# Patient Record
Sex: Male | Born: 1960
Health system: Southern US, Community
[De-identification: ages and names within clinical notes are randomized; demographics above are authoritative.]

## PROBLEM LIST (undated history)

## (undated) ENCOUNTER — Ambulatory Visit: Admission: EM | Source: Home / Self Care

## (undated) DIAGNOSIS — I10 Essential (primary) hypertension: Secondary | ICD-10-CM

## (undated) DIAGNOSIS — I350 Nonrheumatic aortic (valve) stenosis: Secondary | ICD-10-CM

## (undated) DIAGNOSIS — G629 Polyneuropathy, unspecified: Secondary | ICD-10-CM

## (undated) DIAGNOSIS — T7840XA Allergy, unspecified, initial encounter: Secondary | ICD-10-CM

## (undated) DIAGNOSIS — I739 Peripheral vascular disease, unspecified: Secondary | ICD-10-CM

## (undated) DIAGNOSIS — J189 Pneumonia, unspecified organism: Secondary | ICD-10-CM

## (undated) DIAGNOSIS — R011 Cardiac murmur, unspecified: Secondary | ICD-10-CM

## (undated) DIAGNOSIS — J302 Other seasonal allergic rhinitis: Secondary | ICD-10-CM

## (undated) DIAGNOSIS — M1990 Unspecified osteoarthritis, unspecified site: Secondary | ICD-10-CM

## (undated) DIAGNOSIS — M5430 Sciatica, unspecified side: Secondary | ICD-10-CM

## (undated) DIAGNOSIS — R000 Tachycardia, unspecified: Secondary | ICD-10-CM

## (undated) DIAGNOSIS — E7800 Pure hypercholesterolemia, unspecified: Secondary | ICD-10-CM

## (undated) DIAGNOSIS — I2510 Atherosclerotic heart disease of native coronary artery without angina pectoris: Secondary | ICD-10-CM

## (undated) DIAGNOSIS — M4800 Spinal stenosis, site unspecified: Secondary | ICD-10-CM

## (undated) DIAGNOSIS — K219 Gastro-esophageal reflux disease without esophagitis: Secondary | ICD-10-CM

## (undated) DIAGNOSIS — R0989 Other specified symptoms and signs involving the circulatory and respiratory systems: Secondary | ICD-10-CM

## (undated) HISTORY — DX: Polyneuropathy, unspecified: G62.9

## (undated) HISTORY — DX: Allergy, unspecified, initial encounter: T78.40XA

## (undated) HISTORY — DX: Other specified symptoms and signs involving the circulatory and respiratory systems: R09.89

## (undated) HISTORY — DX: Atherosclerotic heart disease of native coronary artery without angina pectoris: I25.10

## (undated) HISTORY — DX: Nonrheumatic aortic (valve) stenosis: I35.0

## (undated) HISTORY — PX: KNEE ARTHROSCOPY: SHX127

## (undated) HISTORY — PX: CARDIAC CATHETERIZATION: SHX172

## (undated) HISTORY — DX: Pure hypercholesterolemia, unspecified: E78.00

---

## 1998-06-11 ENCOUNTER — Emergency Department (HOSPITAL_COMMUNITY): Admission: EM | Admit: 1998-06-11 | Discharge: 1998-06-11 | Payer: Self-pay | Admitting: Emergency Medicine

## 1998-06-11 ENCOUNTER — Encounter: Payer: Self-pay | Admitting: Emergency Medicine

## 1998-06-18 ENCOUNTER — Encounter: Admission: RE | Admit: 1998-06-18 | Discharge: 1998-06-18 | Payer: Self-pay | Admitting: Internal Medicine

## 2003-04-18 ENCOUNTER — Observation Stay (HOSPITAL_COMMUNITY): Admission: EM | Admit: 2003-04-18 | Discharge: 2003-04-19 | Payer: Self-pay | Admitting: *Deleted

## 2004-03-25 ENCOUNTER — Ambulatory Visit: Payer: Self-pay | Admitting: Internal Medicine

## 2004-10-08 ENCOUNTER — Ambulatory Visit: Payer: Self-pay | Admitting: Internal Medicine

## 2005-12-26 ENCOUNTER — Ambulatory Visit: Payer: Self-pay | Admitting: Internal Medicine

## 2006-04-07 ENCOUNTER — Ambulatory Visit: Payer: Self-pay | Admitting: Internal Medicine

## 2006-07-13 ENCOUNTER — Emergency Department (HOSPITAL_COMMUNITY): Admission: EM | Admit: 2006-07-13 | Discharge: 2006-07-13 | Payer: Self-pay | Admitting: Emergency Medicine

## 2006-07-13 IMAGING — CR DG CHEST 2V
2 series · 2 of 2 positions shown · non-contrast
Comparison: none

CLINICAL DATA: 45 year-old with chest pain.
 CHEST - 2 VIEW:

[w chest pa]
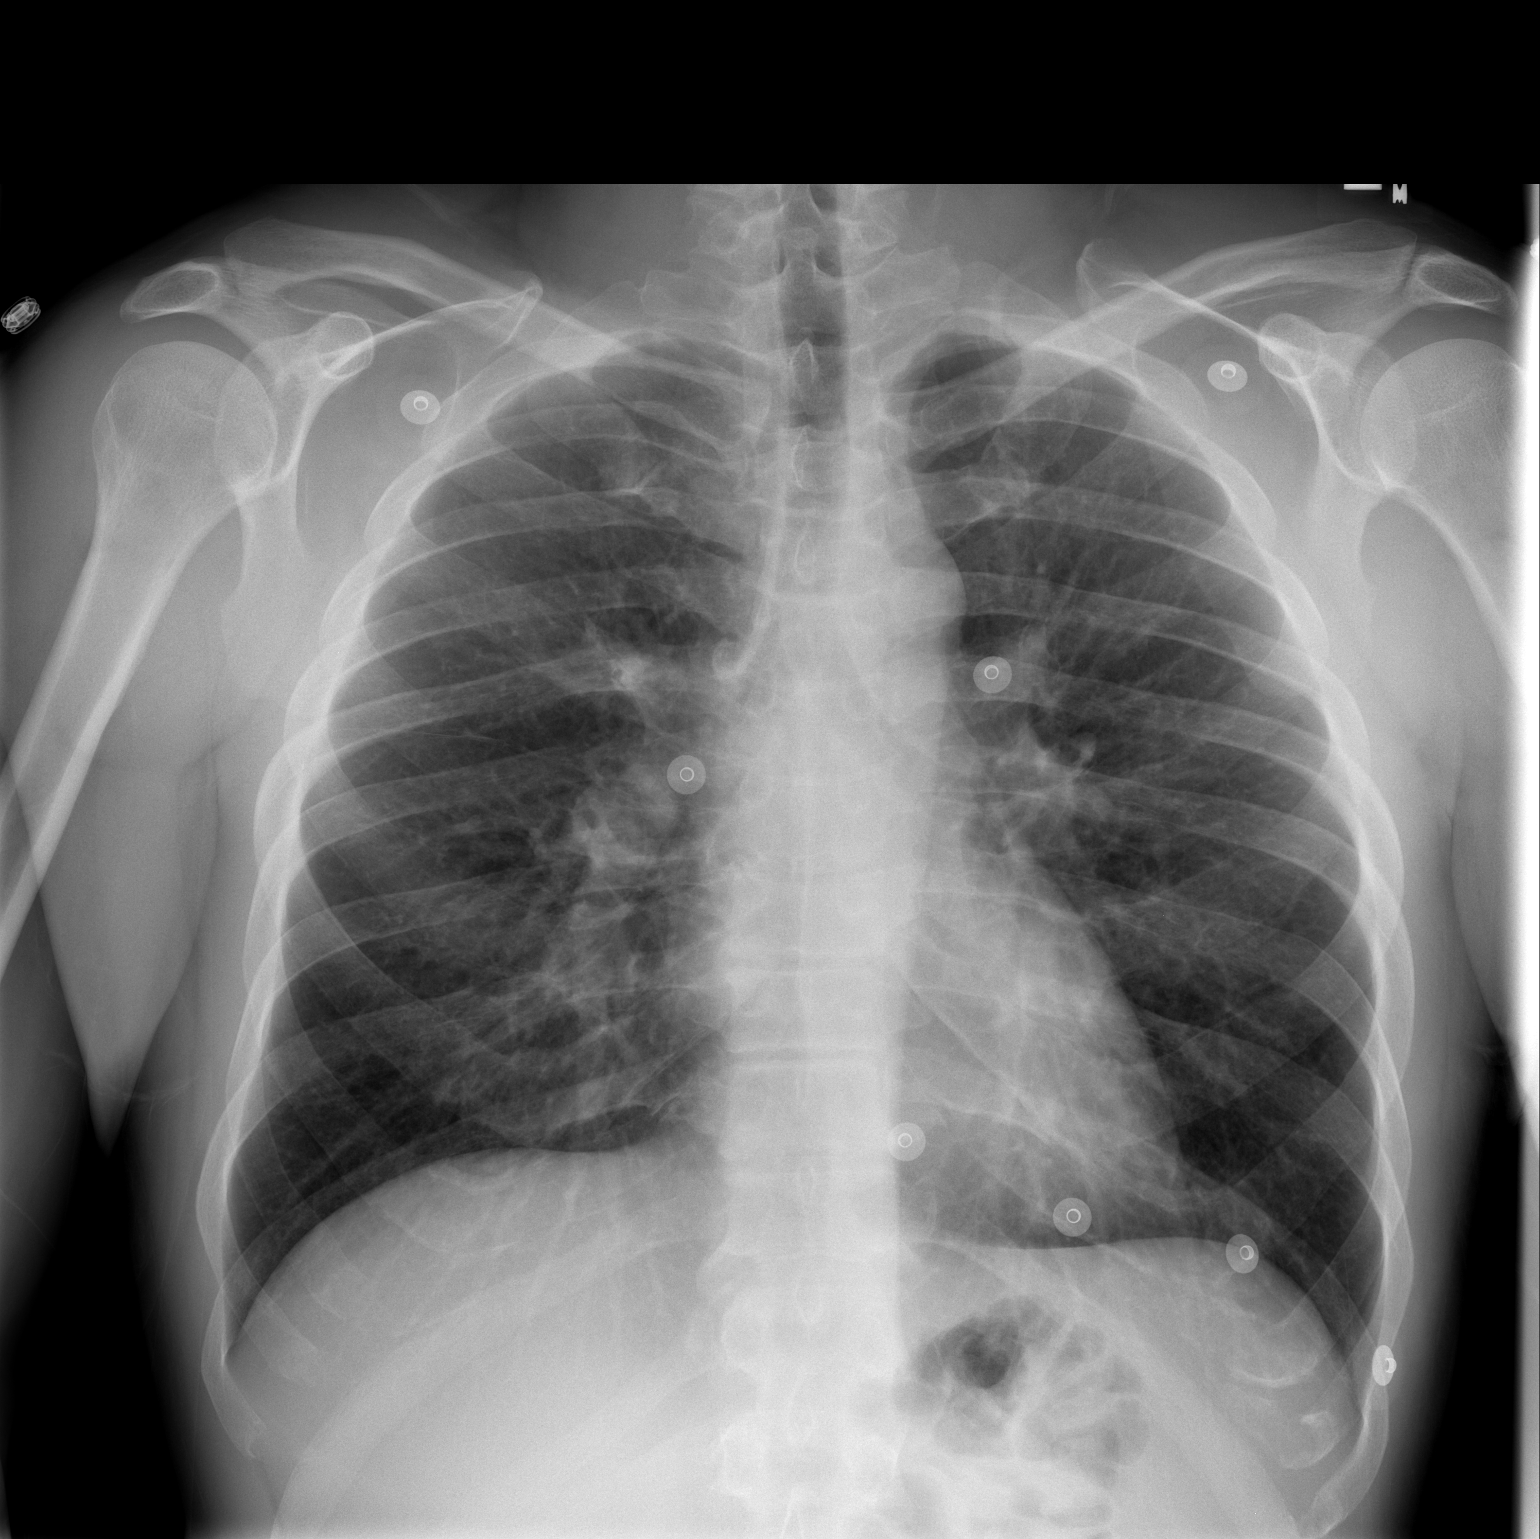

[w chest lat]
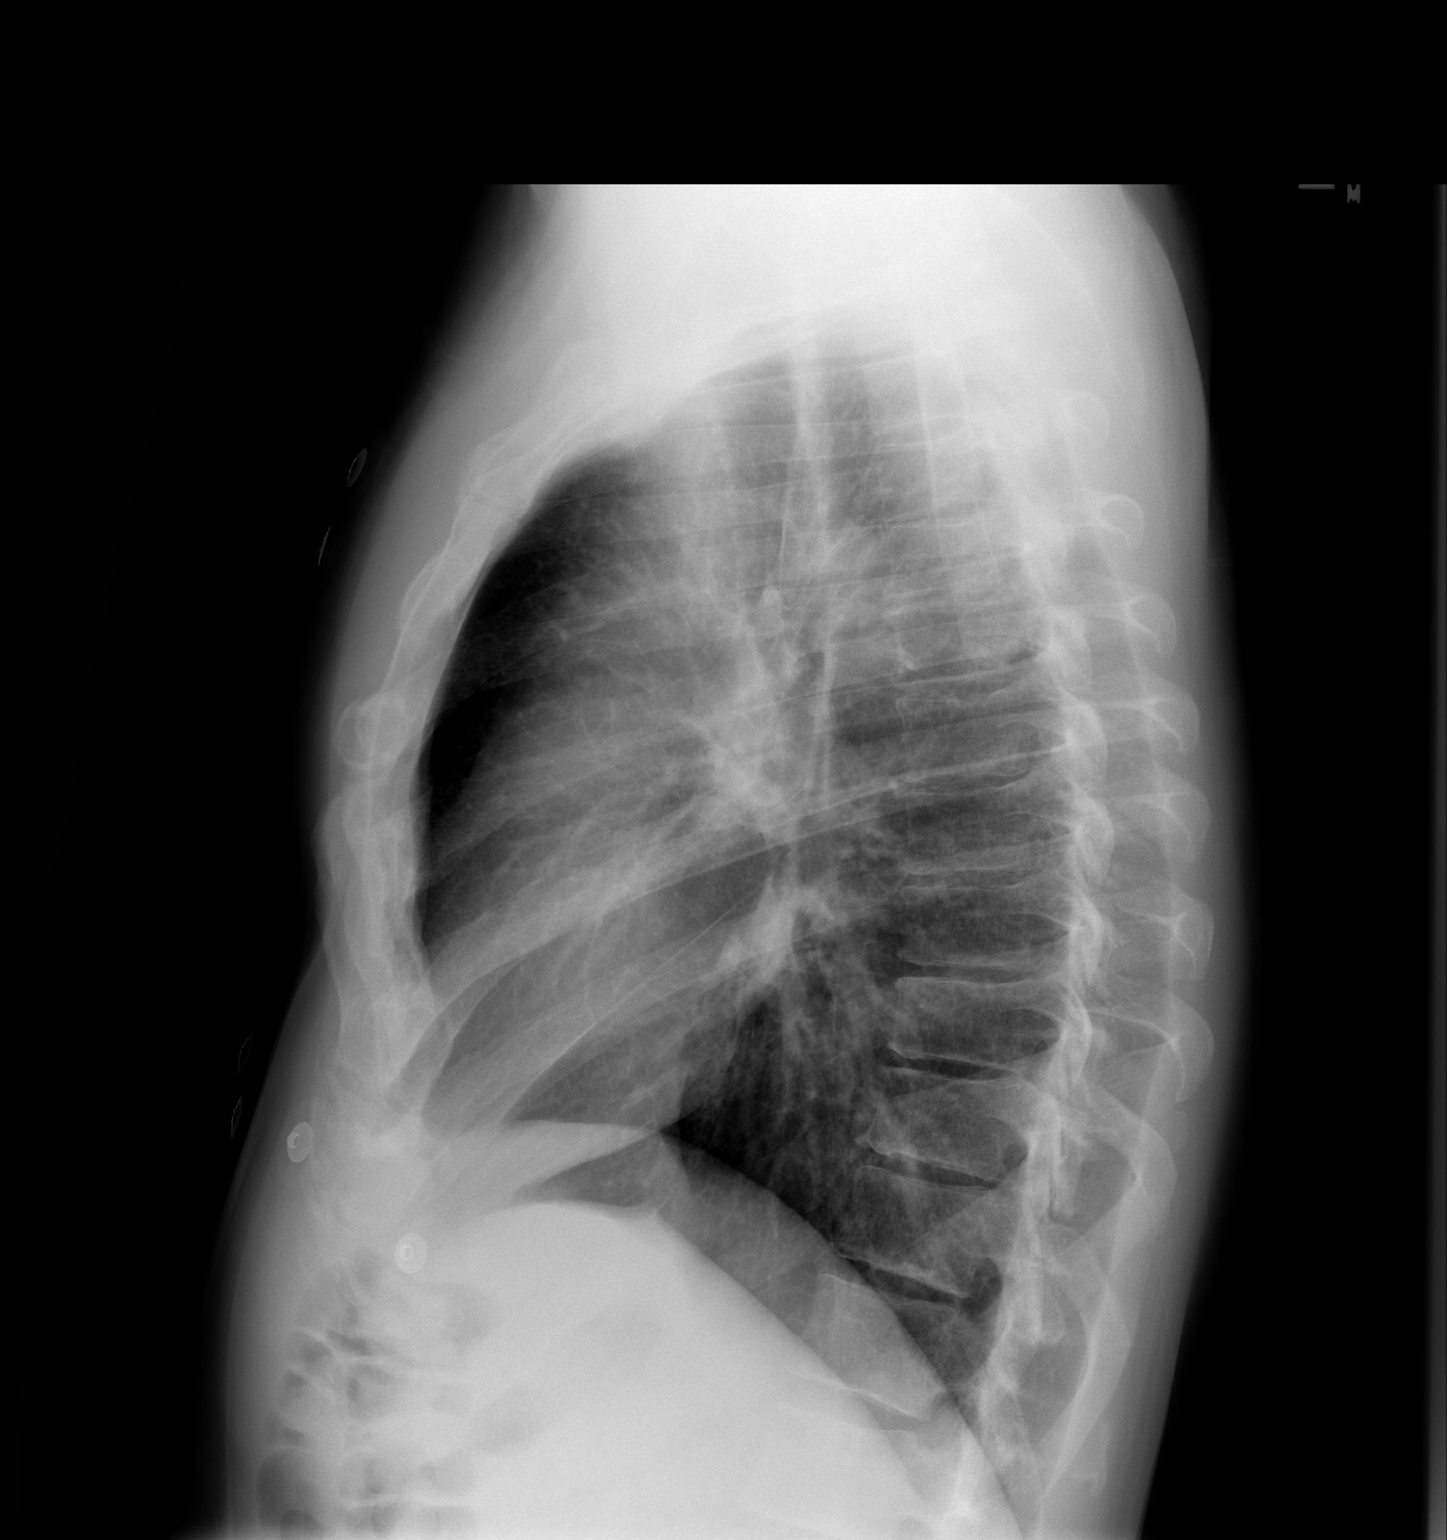

[2 of 2 positions shown; findings below may reference images not displayed]

FINDINGS: Two views of the chest demonstrate mild interstitial thickening.  Findings could represent chronic bronchitic changes vs mild edema.  There is no focal airspace disease. Heart and mediastinum are within normal limits.  The trachea is midline.  Bone structures are intact.  Negative for pleural effusions.
IMPRESSION: Interstitial thickening possibly related to chronic bronchitic changes. An atypical infection cannot be excluded.

## 2006-07-15 ENCOUNTER — Ambulatory Visit: Payer: Self-pay | Admitting: Internal Medicine

## 2007-01-15 ENCOUNTER — Encounter: Payer: Self-pay | Admitting: *Deleted

## 2007-01-18 DIAGNOSIS — E785 Hyperlipidemia, unspecified: Secondary | ICD-10-CM

## 2007-01-18 DIAGNOSIS — E782 Mixed hyperlipidemia: Secondary | ICD-10-CM | POA: Insufficient documentation

## 2007-01-18 DIAGNOSIS — I10 Essential (primary) hypertension: Secondary | ICD-10-CM | POA: Insufficient documentation

## 2011-05-20 HISTORY — PX: COLONOSCOPY: SHX174

## 2012-02-20 ENCOUNTER — Ambulatory Visit (INDEPENDENT_AMBULATORY_CARE_PROVIDER_SITE_OTHER): Payer: BC Managed Care – PPO | Admitting: Family Medicine

## 2012-02-20 DIAGNOSIS — Z23 Encounter for immunization: Secondary | ICD-10-CM

## 2012-09-13 ENCOUNTER — Other Ambulatory Visit: Payer: Self-pay | Admitting: Family Medicine

## 2012-09-13 ENCOUNTER — Ambulatory Visit
Admission: RE | Admit: 2012-09-13 | Discharge: 2012-09-13 | Disposition: A | Discharge status: Home / Self Care | Payer: BC Managed Care – PPO | Source: Ambulatory Visit | Attending: Family Medicine | Admitting: Family Medicine

## 2012-09-13 DIAGNOSIS — R071 Chest pain on breathing: Secondary | ICD-10-CM

## 2012-09-13 IMAGING — CR DG STERNUM 2+V
3 series · 3 of 3 positions shown · non-contrast
Comparison: prior chest x-ray [DATE]}

CLINICAL DATA: Chest wall pain, lump

STERNUM - 2+ VIEW

[view not recorded (1 of 3)]
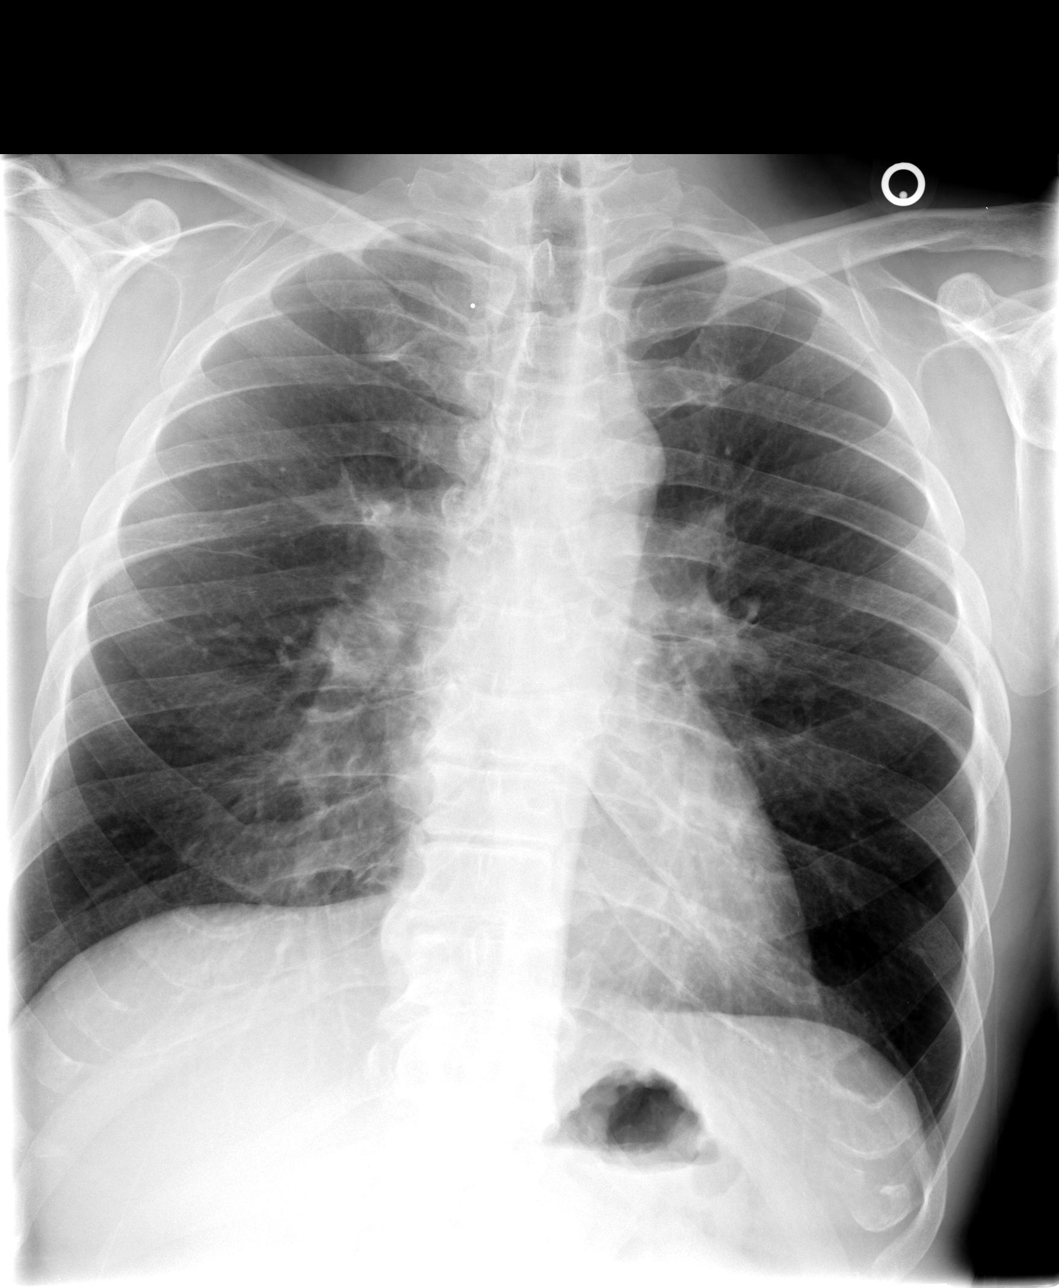

[view not recorded (2 of 3)]
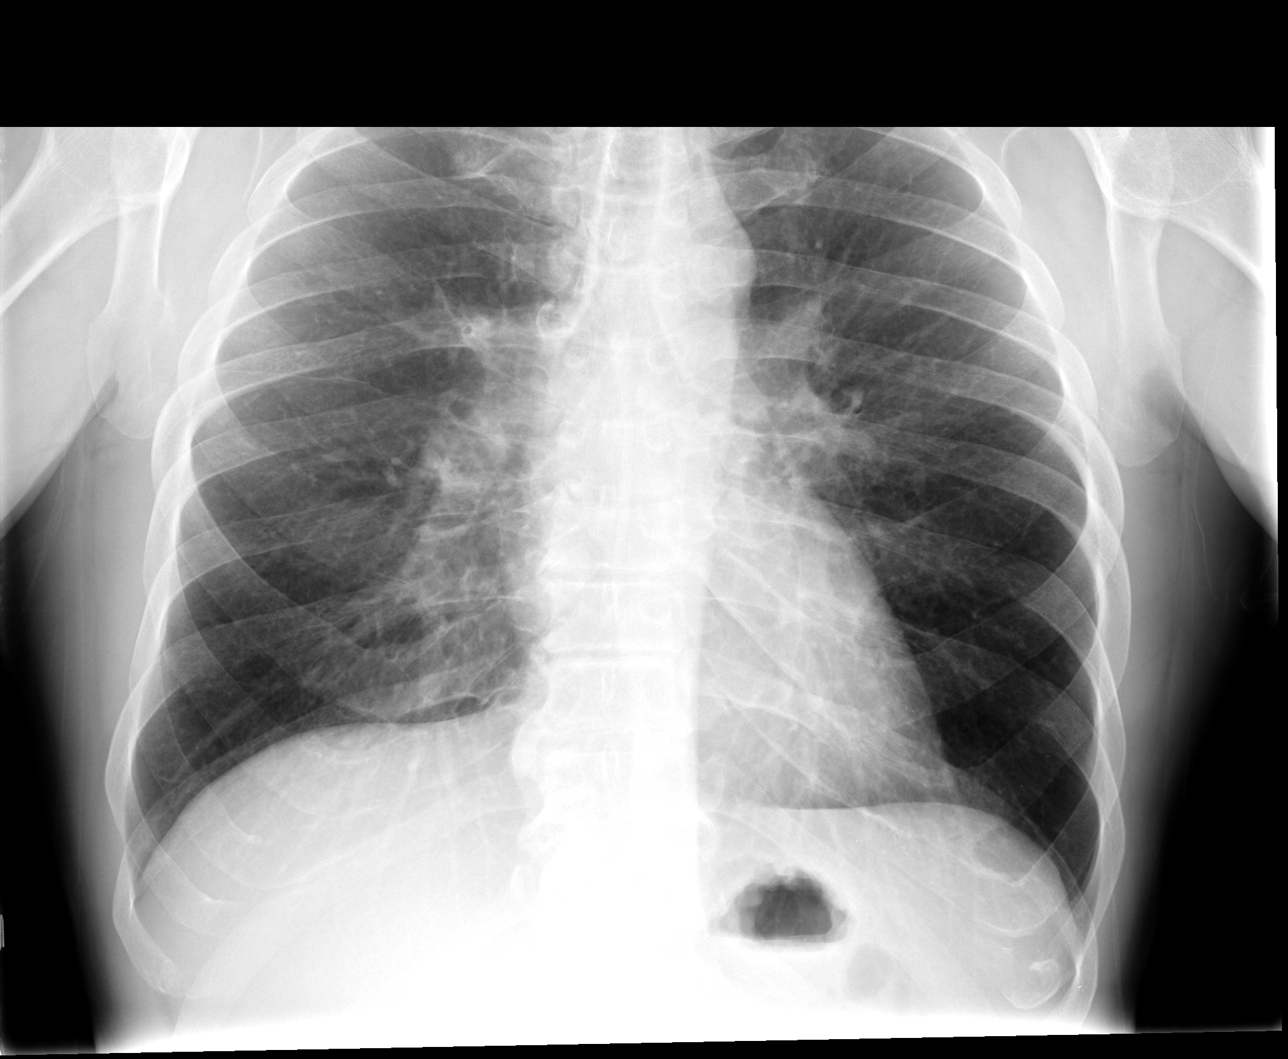

[view not recorded (3 of 3)]
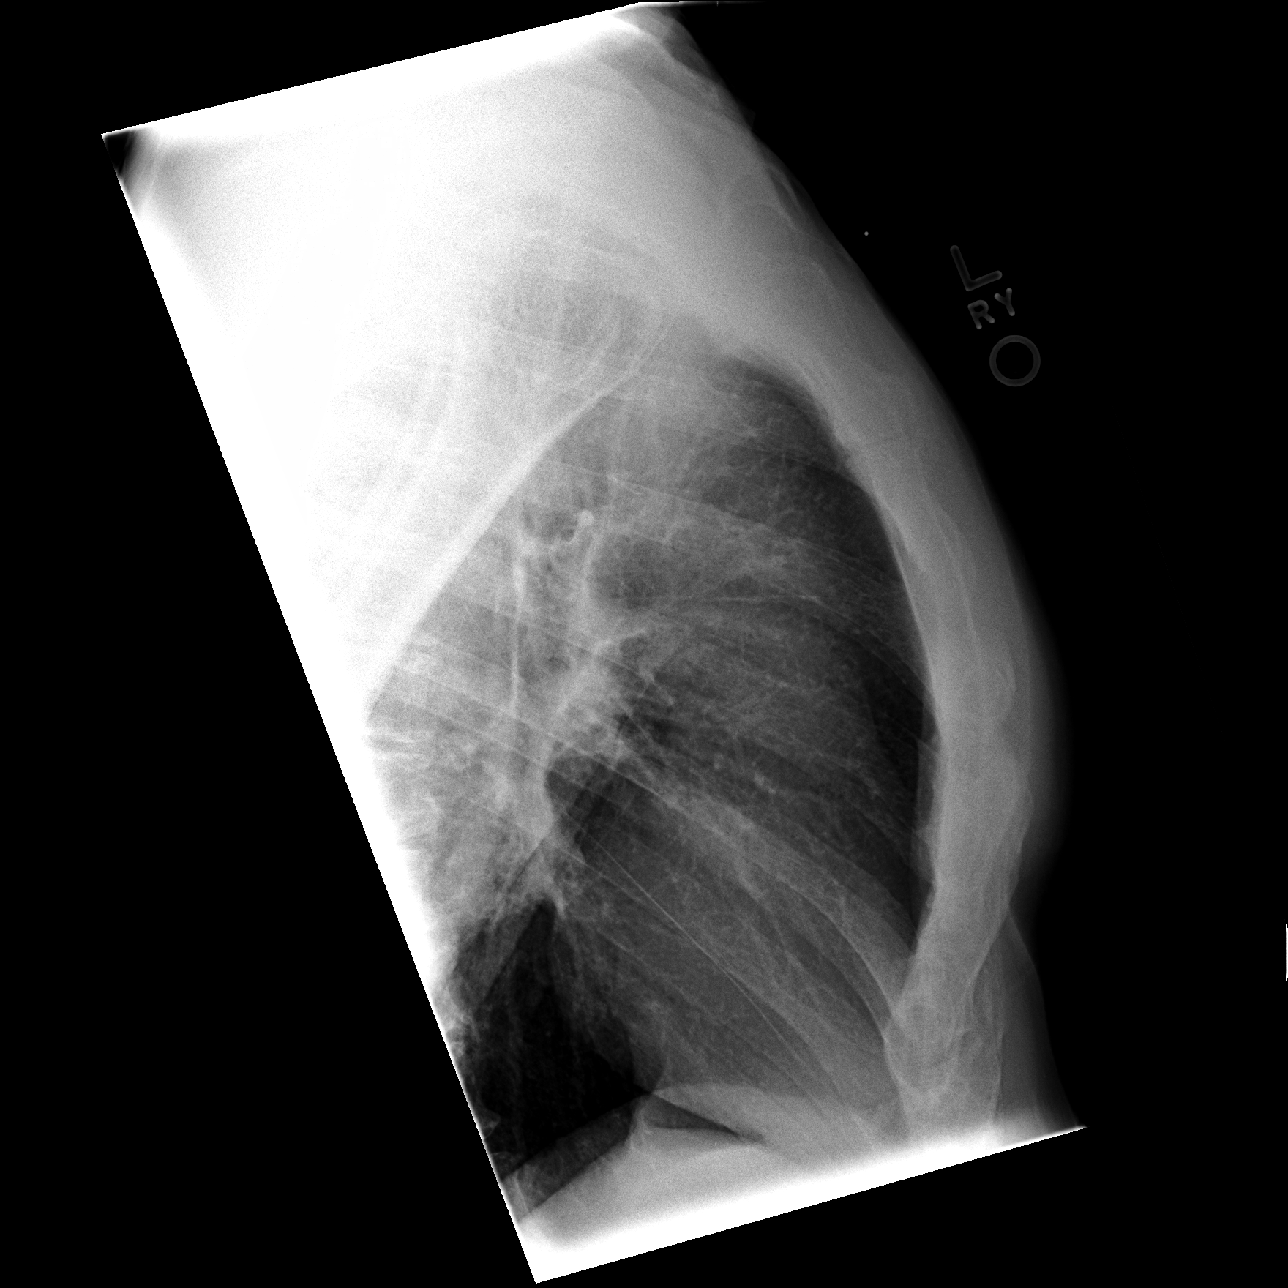

[3 of 3 positions shown; findings below may reference images not displayed]

FINDINGS: Frontal and lateral views of the chest focusing on the
sternum are submitted for interpretation. The BB marker at the
symptomatic site projects over the sternal head of the right
clavicle.  No abnormality or asymmetry of the right clavicular head
versus the left. The lungs are clear.  No edema, consolidation,
effusion, pneumothorax or suspicious pulmonary nodule.  Heart and
mediastinal contours within normal limits.
IMPRESSION: 1.  The marker at the symptomatic site projects over the sternal
head of the right clavicle.  There is no underlying osseous
abnormality by conventional x-ray.

2.  No acute cardiopulmonary disease.

## 2013-01-25 ENCOUNTER — Ambulatory Visit (INDEPENDENT_AMBULATORY_CARE_PROVIDER_SITE_OTHER): Payer: BC Managed Care – PPO | Admitting: Internal Medicine

## 2013-01-25 VITALS — BP 112/17 | HR 104 | Temp 98.5°F | Resp 16 | Ht 67.0 in | Wt 153.4 lb

## 2013-01-25 DIAGNOSIS — Z23 Encounter for immunization: Secondary | ICD-10-CM

## 2013-01-25 DIAGNOSIS — L530 Toxic erythema: Secondary | ICD-10-CM

## 2013-01-25 MED ORDER — FLUOCINONIDE-E 0.05 % EX CREA: TOPICAL_CREAM | Freq: Two times a day (BID) | CUTANEOUS | Status: DC

## 2013-01-25 NOTE — Progress Notes (Signed)
  Subjective:    Patient ID: Philip Winters, male    DOB: 1960-12-04, 52 y.o.   MRN: [id number]  HPI Patient reports rash for 1 week. Started as small red areas, bilaterally below chest and spread for 5 days to anterior trunk. No itching, no draining. Has applied OTC cortisone cream with no relief of lesions. Took Claritin this morning.  Has not used any new detergents, skin products. No travel.   No fever, cough, diarrhea.  He reports that he sees Dr. Erling Conte at [facility] for management of his HTN and for annual exams. He reports his heart rate has always run above 100 even as a child. He works as a Nutritional therapist and exercise without chest pain or SOB.  He is interested in a flu shot today. Not interested in Pneumovax.  Review of Systems     Objective:   Physical Exam BP 112/17  Pulse 104  Temp(Src) 98.5 F (36.9 C) (Oral)  Resp 16  Ht 5\' 7"  (1.702 m)  Wt 153 lb 6.4 oz (69.582 kg)  BMI 24.02 kg/m2  SpO2 98% Reck pulse 104 Well developed male in NAD, appears a little anxious.  Anterior trunk with erythematous raised, rash, nontender, more concentrated on left, non vesicular. Some lesions pin point, some confluent to 0.5 cm.  No lesions below waistband or on back.      Assessment & Plan:   Erythema toxicum    Meds ordered this encounter  Medications  . valsartan (DIOVAN) 320 MG tablet    Sig: Take 320 mg by mouth daily.  Marland Kitchen aspirin 81 MG tablet    Sig: Take 81 mg by mouth daily.  . fluocinonide-emollient (LIDEX-E) 0.05 % cream    Sig: Apply topically 2 (two) times daily.    Dispense:  30 g    Refill:  0   Continue antihistamine for 7-10 days. Lidex-E BID  I participated in this eva/.I have reviewed and agree with documentation. Robert P. Merla Riches, M.D.

## 2013-11-04 ENCOUNTER — Encounter (HOSPITAL_BASED_OUTPATIENT_CLINIC_OR_DEPARTMENT_OTHER): Payer: Self-pay | Admitting: *Deleted

## 2013-11-04 NOTE — Progress Notes (Signed)
To come in for [id number] ck on labs dr Moreen Fowler

## 2013-11-07 ENCOUNTER — Encounter (HOSPITAL_BASED_OUTPATIENT_CLINIC_OR_DEPARTMENT_OTHER)
Admission: RE | Admit: 2013-11-07 | Discharge: 2013-11-07 | Disposition: A | Discharge status: Home / Self Care | Payer: BC Managed Care – PPO | Source: Ambulatory Visit | Attending: Orthopedic Surgery | Admitting: Orthopedic Surgery

## 2013-11-07 LAB — BASIC METABOLIC PANEL
BUN: 12 mg/dL (ref 6–23)
CO2: 26 mEq/L (ref 19–32)
Calcium: 9.5 mg/dL (ref 8.4–10.5)
Chloride: 102 mEq/L (ref 96–112)
Creatinine, Ser: 0.8 mg/dL (ref 0.50–1.35)
GFR calc Af Amer: >90 mL/min (ref >90)
GFR calc non Af Amer: >90 mL/min (ref >90)
Glucose, Bld: 110 mg/dL — ABNORMAL HIGH (ref 70–99)
Potassium: 4.7 mEq/L (ref 3.7–5.3)
Sodium: 140 mEq/L (ref 137–147)

## 2013-11-08 ENCOUNTER — Other Ambulatory Visit: Payer: Self-pay | Admitting: Orthopedic Surgery

## 2013-11-10 ENCOUNTER — Encounter (HOSPITAL_BASED_OUTPATIENT_CLINIC_OR_DEPARTMENT_OTHER): Payer: Self-pay

## 2013-11-10 ENCOUNTER — Encounter (HOSPITAL_BASED_OUTPATIENT_CLINIC_OR_DEPARTMENT_OTHER): Payer: BC Managed Care – PPO | Admitting: Anesthesiology

## 2013-11-10 ENCOUNTER — Encounter (HOSPITAL_BASED_OUTPATIENT_CLINIC_OR_DEPARTMENT_OTHER)
Admission: RE | Disposition: A | Discharge status: Home / Self Care | Payer: Self-pay | Source: Ambulatory Visit | Attending: Orthopedic Surgery

## 2013-11-10 ENCOUNTER — Ambulatory Visit (HOSPITAL_BASED_OUTPATIENT_CLINIC_OR_DEPARTMENT_OTHER): Payer: BC Managed Care – PPO | Admitting: Anesthesiology

## 2013-11-10 ENCOUNTER — Ambulatory Visit (HOSPITAL_BASED_OUTPATIENT_CLINIC_OR_DEPARTMENT_OTHER)
Admission: RE | Admit: 2013-11-10 | Discharge: 2013-11-10 | Disposition: A | Discharge status: Home / Self Care | Payer: BC Managed Care – PPO | Source: Ambulatory Visit | Attending: Orthopedic Surgery | Admitting: Orthopedic Surgery

## 2013-11-10 DIAGNOSIS — Z01812 Encounter for preprocedural laboratory examination: Secondary | ICD-10-CM | POA: Insufficient documentation

## 2013-11-10 DIAGNOSIS — F17200 Nicotine dependence, unspecified, uncomplicated: Secondary | ICD-10-CM | POA: Insufficient documentation

## 2013-11-10 DIAGNOSIS — G5600 Carpal tunnel syndrome, unspecified upper limb: Secondary | ICD-10-CM | POA: Insufficient documentation

## 2013-11-10 DIAGNOSIS — Z01810 Encounter for preprocedural cardiovascular examination: Secondary | ICD-10-CM | POA: Insufficient documentation

## 2013-11-10 DIAGNOSIS — Z880 Allergy status to penicillin: Secondary | ICD-10-CM | POA: Insufficient documentation

## 2013-11-10 DIAGNOSIS — Z7982 Long term (current) use of aspirin: Secondary | ICD-10-CM | POA: Insufficient documentation

## 2013-11-10 DIAGNOSIS — M6530 Trigger finger, unspecified finger: Secondary | ICD-10-CM | POA: Insufficient documentation

## 2013-11-10 DIAGNOSIS — I10 Essential (primary) hypertension: Secondary | ICD-10-CM | POA: Insufficient documentation

## 2013-11-10 DIAGNOSIS — G5620 Lesion of ulnar nerve, unspecified upper limb: Secondary | ICD-10-CM | POA: Insufficient documentation

## 2013-11-10 HISTORY — PX: TRIGGER FINGER RELEASE: SHX641

## 2013-11-10 HISTORY — PX: ULNAR NERVE TRANSPOSITION: SHX2595

## 2013-11-10 HISTORY — PX: CARPAL TUNNEL WITH CUBITAL TUNNEL: SHX5608

## 2013-11-10 HISTORY — DX: Essential (primary) hypertension: I10

## 2013-11-10 HISTORY — DX: Other seasonal allergic rhinitis: J30.2

## 2013-11-10 LAB — POCT HEMOGLOBIN-HEMACUE: Hemoglobin: 15.6 g/dL (ref 13.0–17.0)

## 2013-11-10 SURGERY — RELEASE, CARPAL TUNNEL AND CUBITAL TUNNEL
Anesthesia: General | Site: Hand | Laterality: Left

## 2013-11-10 MED ORDER — EPHEDRINE SULFATE 50 MG/ML IJ SOLN
INTRAMUSCULAR | Status: DC | PRN
  Administered 2013-11-10: 5 mg via INTRAVENOUS
  Administered 2013-11-10: 10 mg via INTRAVENOUS

## 2013-11-10 MED ORDER — FENTANYL CITRATE 0.05 MG/ML IJ SOLN: 50.0000 ug | INTRAMUSCULAR | Status: DC | PRN

## 2013-11-10 MED ORDER — HYDROMORPHONE HCL PF 1 MG/ML IJ SOLN: 0.2500 mg | INTRAMUSCULAR | Status: DC | PRN

## 2013-11-10 MED ORDER — MIDAZOLAM HCL 2 MG/2ML IJ SOLN: 1.0000 mg | INTRAMUSCULAR | Status: DC | PRN

## 2013-11-10 MED ORDER — FENTANYL CITRATE 0.05 MG/ML IJ SOLN
INTRAMUSCULAR | Status: AC
  Filled 2013-11-10: qty 4

## 2013-11-10 MED ORDER — LIDOCAINE HCL (CARDIAC) 20 MG/ML IV SOLN
INTRAVENOUS | Status: DC | PRN
  Administered 2013-11-10: 60 mg via INTRAVENOUS

## 2013-11-10 MED ORDER — PROPOFOL 10 MG/ML IV BOLUS
INTRAVENOUS | Status: DC | PRN
  Administered 2013-11-10: 150 mg via INTRAVENOUS

## 2013-11-10 MED ORDER — LACTATED RINGERS IV SOLN
INTRAVENOUS | Status: DC
  Administered 2013-11-10 (×2): via INTRAVENOUS

## 2013-11-10 MED ORDER — LIDOCAINE HCL 2 % IJ SOLN
INTRAMUSCULAR | Status: AC
  Filled 2013-11-10: qty 20

## 2013-11-10 MED ORDER — LIDOCAINE HCL 2 % IJ SOLN
INTRAMUSCULAR | Status: DC | PRN
  Administered 2013-11-10: 3 mL

## 2013-11-10 MED ORDER — ONDANSETRON HCL 4 MG/2ML IJ SOLN
INTRAMUSCULAR | Status: DC | PRN
  Administered 2013-11-10: 4 mg via INTRAVENOUS

## 2013-11-10 MED ORDER — MIDAZOLAM HCL 5 MG/5ML IJ SOLN
INTRAMUSCULAR | Status: DC | PRN
  Administered 2013-11-10: 2 mg via INTRAVENOUS

## 2013-11-10 MED ORDER — FENTANYL CITRATE 0.05 MG/ML IJ SOLN
INTRAMUSCULAR | Status: DC | PRN
  Administered 2013-11-10: 100 ug via INTRAVENOUS
  Administered 2013-11-10: 50 ug via INTRAVENOUS
  Administered 2013-11-10 (×2): 25 ug via INTRAVENOUS

## 2013-11-10 MED ORDER — CHLORHEXIDINE GLUCONATE 4 % EX LIQD: 60.0000 mL | Freq: Once | CUTANEOUS | Status: DC

## 2013-11-10 MED ORDER — HYDROMORPHONE HCL 2 MG PO TABS: 2.0000 mg | ORAL_TABLET | ORAL | Status: DC | PRN

## 2013-11-10 MED ORDER — OXYCODONE HCL 5 MG PO TABS
5.0000 mg | ORAL_TABLET | Freq: Once | ORAL | Status: AC
  Administered 2013-11-10: 5 mg via ORAL

## 2013-11-10 MED ORDER — DEXAMETHASONE SODIUM PHOSPHATE 10 MG/ML IJ SOLN
INTRAMUSCULAR | Status: DC | PRN
  Administered 2013-11-10: 10 mg via INTRAVENOUS

## 2013-11-10 MED ORDER — MIDAZOLAM HCL 2 MG/2ML IJ SOLN
INTRAMUSCULAR | Status: AC
  Filled 2013-11-10: qty 2

## 2013-11-10 MED ORDER — DOXYCYCLINE HYCLATE 100 MG PO TABS: 100.0000 mg | ORAL_TABLET | Freq: Two times a day (BID) | ORAL | Status: DC

## 2013-11-10 MED ORDER — OXYCODONE HCL 5 MG PO TABS
ORAL_TABLET | ORAL | Status: AC
  Filled 2013-11-10: qty 1

## 2013-11-10 MED ORDER — 0.9 % SODIUM CHLORIDE (POUR BTL) OPTIME
TOPICAL | Status: DC | PRN
  Administered 2013-11-10: 200 mL

## 2013-11-10 MED ORDER — METHYLPREDNISOLONE ACETATE 40 MG/ML IJ SUSP
INTRAMUSCULAR | Status: AC
  Filled 2013-11-10: qty 1

## 2013-11-10 SURGICAL SUPPLY — 56 items
BANDAGE ADH SHEER 1  50/CT (GAUZE/BANDAGES/DRESSINGS) IMPLANT
BANDAGE COBAN STERILE 2 (GAUZE/BANDAGES/DRESSINGS) IMPLANT
BANDAGE ELASTIC 3 VELCRO ST LF (GAUZE/BANDAGES/DRESSINGS) IMPLANT
BANDAGE ELASTIC 4 VELCRO ST LF (GAUZE/BANDAGES/DRESSINGS) ×3 IMPLANT
BLADE MINI RND TIP GREEN BEAV (BLADE) IMPLANT
BLADE SURG 15 STRL LF DISP TIS (BLADE) ×2 IMPLANT
BLADE SURG 15 STRL SS (BLADE) ×3
BNDG CMPR 9X4 STRL LF SNTH (GAUZE/BANDAGES/DRESSINGS) ×2
BNDG COHESIVE 3X5 TAN STRL LF (GAUZE/BANDAGES/DRESSINGS) ×3 IMPLANT
BNDG COHESIVE 4X5 TAN STRL (GAUZE/BANDAGES/DRESSINGS) IMPLANT
BNDG ESMARK 4X9 LF (GAUZE/BANDAGES/DRESSINGS) ×3 IMPLANT
BNDG GAUZE ELAST 4 BULKY (GAUZE/BANDAGES/DRESSINGS) ×3 IMPLANT
BRUSH SCRUB EZ PLAIN DRY (MISCELLANEOUS) ×3 IMPLANT
CORDS BIPOLAR (ELECTRODE) ×6 IMPLANT
COVER MAYO STAND STRL (DRAPES) ×3 IMPLANT
COVER TABLE BACK 60X90 (DRAPES) ×3 IMPLANT
CUFF TOURNIQUET SINGLE 18IN (TOURNIQUET CUFF) ×3 IMPLANT
DECANTER SPIKE VIAL GLASS SM (MISCELLANEOUS) IMPLANT
DRAPE EXTREMITY T 121X128X90 (DRAPE) ×3 IMPLANT
DRAPE SURG 17X23 STRL (DRAPES) ×3 IMPLANT
DRSG TEGADERM 4X4.75 (GAUZE/BANDAGES/DRESSINGS) ×3 IMPLANT
GAUZE SPONGE 4X4 12PLY STRL (GAUZE/BANDAGES/DRESSINGS) ×3 IMPLANT
GLOVE BIO SURGEON STRL SZ7.5 (GLOVE) ×3 IMPLANT
GLOVE BIOGEL M STRL SZ7.5 (GLOVE) IMPLANT
GLOVE BIOGEL PI IND STRL 7.0 (GLOVE) ×2 IMPLANT
GLOVE BIOGEL PI IND STRL 8 (GLOVE) ×2 IMPLANT
GLOVE BIOGEL PI INDICATOR 7.0 (GLOVE) ×1
GLOVE BIOGEL PI INDICATOR 8 (GLOVE) ×1
GLOVE ORTHO TXT STRL SZ7.5 (GLOVE) ×3 IMPLANT
GOWN STRL REUS W/ TWL LRG LVL3 (GOWN DISPOSABLE) IMPLANT
GOWN STRL REUS W/ TWL XL LVL3 (GOWN DISPOSABLE) ×2 IMPLANT
GOWN STRL REUS W/TWL LRG LVL3 (GOWN DISPOSABLE)
GOWN STRL REUS W/TWL XL LVL3 (GOWN DISPOSABLE) ×6 IMPLANT
LOOP VESSEL MAXI BLUE (MISCELLANEOUS) ×3 IMPLANT
NEEDLE 27GAX1X1/2 (NEEDLE) ×3 IMPLANT
PACK BASIN DAY SURGERY FS (CUSTOM PROCEDURE TRAY) ×3 IMPLANT
PAD CAST 3X4 CTTN HI CHSV (CAST SUPPLIES) ×2 IMPLANT
PADDING CAST ABS 4INX4YD NS (CAST SUPPLIES)
PADDING CAST ABS COTTON 4X4 ST (CAST SUPPLIES) IMPLANT
PADDING CAST COTTON 3X4 STRL (CAST SUPPLIES) ×3
SLEEVE SCD COMPRESS KNEE MED (MISCELLANEOUS) ×3 IMPLANT
SPONGE GAUZE 4X4 12PLY STER LF (GAUZE/BANDAGES/DRESSINGS) IMPLANT
STOCKINETTE 4X48 STRL (DRAPES) ×3 IMPLANT
STRIP CLOSURE SKIN 1/2X4 (GAUZE/BANDAGES/DRESSINGS) ×3 IMPLANT
SUT ETHILON 5 0 P 3 18 (SUTURE)
SUT NYLON ETHILON 5-0 P-3 1X18 (SUTURE) IMPLANT
SUT PROLENE 3 0 PS 2 (SUTURE) ×3 IMPLANT
SUT PROLENE 4 0 P 3 18 (SUTURE) IMPLANT
SUT VIC AB 4-0 P-3 18XBRD (SUTURE) ×4 IMPLANT
SUT VIC AB 4-0 P3 18 (SUTURE) ×6
SYR 3ML 23GX1 SAFETY (SYRINGE) IMPLANT
SYR BULB 3OZ (MISCELLANEOUS) ×3 IMPLANT
SYR CONTROL 10ML LL (SYRINGE) ×3 IMPLANT
TOWEL OR 17X24 6PK STRL BLUE (TOWEL DISPOSABLE) ×6 IMPLANT
TRAY DSU PREP LF (CUSTOM PROCEDURE TRAY) ×3 IMPLANT
UNDERPAD 30X30 INCONTINENT (UNDERPADS AND DIAPERS) ×3 IMPLANT

## 2013-11-10 NOTE — Anesthesia Procedure Notes (Signed)
Procedure Name: LMA Insertion Date/Time: 11/10/2013 7:44 AM Performed by: Lieutenant Diego Pre-anesthesia Checklist: Patient identified, Emergency Drugs available, Suction available and Patient being monitored Patient Re-evaluated:Patient Re-evaluated prior to inductionOxygen Delivery Method: Circle System Utilized Preoxygenation: Pre-oxygenation with 100% oxygen Intubation Type: IV induction Ventilation: Mask ventilation without difficulty LMA: LMA inserted LMA Size: 4.0 Number of attempts: 1 Airway Equipment and Method: bite block Placement Confirmation: positive ETCO2 and breath sounds checked- equal and bilateral Tube secured with: Tape Dental Injury: Teeth and Oropharynx as per pre-operative assessment

## 2013-11-10 NOTE — Transfer of Care (Signed)
Immediate Anesthesia Transfer of Care Note  Patient: Philip Winters  Procedure(s) Performed: Procedure(s): LEFT CARPAL TUNNEL RELEASE (Left) LEFT INDEX A-1 PULLEY RELEASE (Left) ULNAR NERVE TRANSPOSITION (Left)  Patient Location: PACU  Anesthesia Type:General  Level of Consciousness: awake and alert   Airway & Oxygen Therapy: Patient Spontanous Breathing and Patient connected to face mask oxygen  Post-op Assessment: Report given to PACU RN and Post -op Vital signs reviewed and stable  Post vital signs: Reviewed and stable  Complications: No apparent anesthesia complications

## 2013-11-10 NOTE — Anesthesia Preprocedure Evaluation (Signed)
Anesthesia Evaluation  Patient identified by MRN, date of birth, ID band Patient awake    Reviewed: Allergy & Precautions, H&P , NPO status , Patient's Chart, lab work & pertinent test results  Airway Mallampati: II TM Distance: >3 FB Neck ROM: Full    Dental no notable dental hx. (+) Teeth Intact, Dental Advisory Given   Pulmonary Current Smoker,  breath sounds clear to auscultation  Pulmonary exam normal       Cardiovascular hypertension, On Medications Rhythm:Regular Rate:Normal     Neuro/Psych negative neurological ROS  negative psych ROS   GI/Hepatic negative GI ROS, Neg liver ROS,   Endo/Other  negative endocrine ROS  Renal/GU negative Renal ROS  negative genitourinary   Musculoskeletal   Abdominal   Peds  Hematology negative hematology ROS (+)   Anesthesia Other Findings   Reproductive/Obstetrics negative OB ROS                           Anesthesia Physical Anesthesia Plan  ASA: II  Anesthesia Plan: General   Post-op Pain Management:    Induction: Intravenous  Airway Management Planned: LMA  Additional Equipment:   Intra-op Plan:   Post-operative Plan: Extubation in OR  Informed Consent: I have reviewed the patients History and Physical, chart, labs and discussed the procedure including the risks, benefits and alternatives for the proposed anesthesia with the patient or authorized representative who has indicated his/her understanding and acceptance.   Dental advisory given  Plan Discussed with: CRNA  Anesthesia Plan Comments:         Anesthesia Quick Evaluation

## 2013-11-10 NOTE — Brief Op Note (Signed)
11/10/2013  9:39 AM  PATIENT:  Philip Winters  53 y.o. male  PRE-OPERATIVE DIAGNOSIS:  left carpal tunnel syndrome/left ulnar nerve at elbow/left index trigger  POST-OPERATIVE DIAGNOSIS:  left carpal tunnel syndrome/left ulnar nerve at elbow/left index trigger  PROCEDURE:  Procedure(s): LEFT CARPAL TUNNEL RELEASE (Left) LEFT INDEX A-1 PULLEY RELEASE (Left) ULNAR NERVE TRANSPOSITION (Left)  SURGEON:  Surgeon(s) and Role:    * Cammie Sickle, MD - Primary  PHYSICIAN ASSISTANT:   ASSISTANTS: nurse  ANESTHESIA:   general  EBL:  Total I/O In: 1600 [I.V.:1600] Out: -   BLOOD ADMINISTERED:none  DRAINS: none   LOCAL MEDICATIONS USED:  XYLOCAINE   SPECIMEN:  No Specimen  DISPOSITION OF SPECIMEN:  N/A  COUNTS:  YES  TOURNIQUET:   Total Tourniquet Time Documented: Upper Arm (Left) - 76 minutes Total: Upper Arm (Left) - 76 minutes   DICTATION: .Other Dictation: Dictation Number [id number]  PLAN OF CARE: Discharge to home after PACU  PATIENT DISPOSITION:  PACU - hemodynamically stable.   Delay start of Pharmacological VTE agent (>24hrs) due to surgical blood loss or risk of bleeding: not applicable

## 2013-11-10 NOTE — Op Note (Signed)
[id number] 

## 2013-11-10 NOTE — Discharge Instructions (Addendum)
Keep dressing on left arm clean and dry for the next 8 days. Take the antibiotics provided for 4 days as directed. Elbow range of motion is permitted. When showering use a large plastic bag to cover the dressing.  Return to office in 24 hours for a check of the elbow wound. Use the narcotic pain medication as necessary for pain. Contact our office if you have unexpected pain swelling bruising or increasing numbness.   Post Anesthesia Home Care Instructions  Activity: Get plenty of rest for the remainder of the day. A responsible adult should stay with you for 24 hours following the procedure.  For the next 24 hours, DO NOT: -Drive a car -Paediatric nurse -Drink alcoholic beverages -Take any medication unless instructed by your physician -Make any legal decisions or sign important papers.  Meals: Start with liquid foods such as gelatin or soup. Progress to regular foods as tolerated. Avoid greasy, spicy, heavy foods. If nausea and/or vomiting occur, drink only clear liquids until the nausea and/or vomiting subsides. Call your physician if vomiting continues.  Special Instructions/Symptoms: Your throat may feel dry or sore from the anesthesia or the breathing tube placed in your throat during surgery. If this causes discomfort, gargle with warm salt water. The discomfort should disappear within 24 hours.

## 2013-11-10 NOTE — H&P (Addendum)
Philip Winters is an 53 y.o. male.   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 HPI: NCV in April 2012 documented severe CTS and McGowan grade 2 ulnar neuropathy.  Philip Winters has some intrinsic atrophy.  He now requests surgical care of these predicaments.  Past Medical History  Diagnosis Date  . Hypertension   . Seasonal allergies     Past Surgical History  Procedure Laterality Date  . Knee arthroscopy  [id number]    left and right  . Colonoscopy      History reviewed. No pertinent family history. Social History:  reports that he has been smoking Cigarettes.  He has been smoking about 1.00 pack per day. He does not have any smokeless tobacco history on file. He reports that he drinks alcohol. He reports that he does not use illicit drugs.  Allergies:  Allergies  Allergen Reactions  . Penicillins Shortness Of Breath  . Codeine Nausea And Vomiting  . Iodine Rash    No prescriptions prior to admission    No results found for this or any previous visit (from the past 48 hour(s)). No results found.  Review of Systems  Constitutional: Negative.   HENT: Negative.   Eyes: Negative.   Respiratory: Negative.   Cardiovascular:       Hx of high blood pressure  Gastrointestinal: Negative.   Genitourinary: Negative.   Musculoskeletal:       Left hand numbness and NCV documented CTS and ulnar neuropathy  Skin: Negative.   Neurological: Negative.   Endo/Heme/Allergies: Negative.   Psychiatric/Behavioral: Negative.     Height 5\' 8"  (1.727 m), weight 74.844 kg (165 lb). Physical Exam  Constitutional: He is oriented to person, place, and time. He appears well-developed and well-nourished.  HENT:  Head: Normocephalic and atraumatic.  Eyes: Conjunctivae and EOM are normal. Pupils are equal, round, and reactive to light.  Neck: Normal range of motion. Neck supple.  Cardiovascular: Normal rate, regular rhythm, normal heart sounds and intact  distal pulses.   Respiratory: Effort normal and breath sounds normal.  GI: Soft. Bowel sounds are normal.  Musculoskeletal: Normal range of motion.  Mild intrinsic atrophy of left hand.  Ulnar NCV velocity 36.4 mps in 2012! Marked median latency prolongation and decreased amplitude in 2012.  These are very chronic neuropathic changes. Exam in holding area reveals locking STS of left index flexors at A 1 pulley.  Neurological: He is alert and oriented to person, place, and time.  Skin: Skin is warm and dry.  Psychiatric: He has a normal mood and affect. His behavior is normal. Judgment and thought content normal.     Assessment/Plan Severe and chronic left CTS and chronic ulnar neuropathy left index stenosing tenosynovitis.  Decompression of median and ulnar nerves and possible transposition of ulnar nerve detailed.  Release of left index A 1 pulley 18 Month recovery time frame outlined.  No guarantees made or implied.  We have no spare parts and recovery is an act of nature post decompression.  CTR recovery quicker e.g. 6 month time frame.  Surgery, aftercare, risks and benefits detailed.  Philip Winters 11/10/2013, 6:05 AM

## 2013-11-10 NOTE — Anesthesia Postprocedure Evaluation (Signed)
  Anesthesia Post-op Note  Patient: Philip Winters  Procedure(s) Performed: Procedure(s): LEFT CARPAL TUNNEL RELEASE (Left) LEFT INDEX A-1 PULLEY RELEASE (Left) ULNAR NERVE TRANSPOSITION (Left)  Patient Location: PACU  Anesthesia Type:General  Level of Consciousness: awake and alert   Airway and Oxygen Therapy: Patient Spontanous Breathing  Post-op Pain: moderate  Post-op Assessment: Post-op Vital signs reviewed, Patient's Cardiovascular Status Stable and Respiratory Function Stable  Post-op Vital Signs: Reviewed  Filed Vitals:   11/10/13 1015  BP: 125/89  Pulse: 101  Temp:   Resp: 18    Complications: No apparent anesthesia complications

## 2013-11-11 ENCOUNTER — Encounter (HOSPITAL_BASED_OUTPATIENT_CLINIC_OR_DEPARTMENT_OTHER): Payer: Self-pay | Admitting: Orthopedic Surgery

## 2013-11-11 NOTE — Op Note (Signed)
[name], [name] NO.:  [id number]  MEDICAL RECORD NO.:  [id number]  LOCATION:                                 FACILITY:  PHYSICIAN:  Youlanda Mighty. Halei Hanover, M.D.      DATE OF BIRTH:  DATE OF PROCEDURE:  11/10/2013 DATE OF DISCHARGE:                              OPERATIVE REPORT   PREOPERATIVE DIAGNOSES: 1. Chronic severe left carpal tunnel syndrome. 2. Chronic severe left cubital tunnel syndrome with unstable ulnar     nerve. 3. Left index stenosing tenosynovitis at A1 pulley.  POSTOPERATIVE DIAGNOSES: 1. Chronic severe left carpal tunnel syndrome. 2. Chronic severe left cubital tunnel syndrome with unstable ulnar     nerve. 3. Left index stenosing tenosynovitis at A1 pulley.  OPERATIONS: 1. Anterior subcutaneous transposition of left ulnar nerve with     reconstruction of an adipose tissue and fascial sling to insure     transposed position. 2. Through separate incision, release of left transverse carpal     ligament. 3. Through separate incision, release of left index finger A1 pulley.  OPERATING SURGEON:  Youlanda Mighty. Timisha Mondry, M.D.  ASSISTANT:  Nurse.  ANESTHESIA:  General by LMA, supplemented by postoperative 2% lidocaine wound block for perioperative comfort.  SUPERVISING ANESTHESIOLOGIST:  Soledad Gerlach, MD.  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  We initially worked him up in 2012, identifying evidence of bilateral carpal tunnel syndrome and significant left ulnar entrapment neuropathy.  He had mild ulnar intrinsic atrophy on the left.  We advised him to strongly consider proceeding with decompression of his carpal canals and either decompression or anterior transposition of the left ulnar nerve.  Due to the rigors of his work as well as other commitments, he decided to postpone surgery until this time.  He now returns, requesting decompression of the left median nerve at  the carpal tunnel, decompression of left ulnar nerve at the elbow anticipating possible anterior subcutaneous transposition, and in the holding area, brought to my attention that he had been experiencing a very significant stenosing tenosynovitis of the left index finger with locking of the finger in flexion and tenderness with A1 pulley.  With informed consent in the holding area, we decided to add release of left index finger A1 pulley to the permit and medical records.  After detailed informed consent, [name] was brought to the operating room at this time.  Preoperatively, he was reminded to the potential risks and benefits of surgery.  He understands with more than 3-year history of significant entrapment neuropathy of the median and ulnar nerves, we cannot guarantee complete recovery.  His electrodiagnostic studies did reveal severe neuropathy.  He understands with the ulnar nerve, he will need to wait 18 months to see the full benefits of surgery.  With the median nerve, he should have appreciation of his complete recovery at 6 months.  This is a salvage procedure.  He was also advised as to the expected outcome of the release of A1 pulley.  He should have recovery of finger flexion and extension, but will likely be stiff at the PIP  joint for a period of time.  Questions regarding the anticipated procedure were invited and answered in detail.  In addition, Dr. Ola Spurr provided detailed anesthesia and informed consent in the holding area.  General anesthesia by LMA technique was recommended and accepted.  Questions were invited and answered in detail.  DESCRIPTION OF PROCEDURE:  Terrelle Ruffolo was transferred to room #1 of the [facility] and placed in supine position on the operating table.  Under Dr. Blane Ohara direct supervision, general anesthesia by LMA technique was induced.  The left hand and arm were prepped with Hibiclens due to an iodine intolerance  followed by sterile draping with sterile stockinette and impervious arthroscopy drapes.  Prophylactic antibiotics were not provided due to the nature of the procedure as well as antibiotic allergy.  The left hand and arm were exsanguinated with Esmarch bandage, the arterial tourniquet was inflated to 220 mmHg.  A routine surgical time- out was accomplished.  Procedure commenced with placement of the hand in a lead hand for finger positioning.  A short oblique incision was then fashioned directly over the A1 pulley of the left index finger.  Subcutaneous tissues were carefully divided taking care to meticulously retract the neurovascular structures followed by release of the A1 pulley.  The tendons delivered, there was noted to be a cuff of tenosynovium.  There was no sign of residual triggering thereafter.  This wound was then repaired with intradermal 3-0 Prolene and a Steri- Strip.  Attention was then directed to the proximal palm.  A short incision was fashioned in line of the ring finger.  Subcutaneous tissues were carefully divided and taking care to identify the palmar fascia. This was split longitudinally in the line of its fibers and subcutaneous vessels were electrocauterized with bipolar forceps.  The distal margin of the transverse carpal ligament was identified, the superficial palmar arch identified.  The carpal canal was sounded with Penfield 4 elevator followed by sequential release of the transverse carpal ligament with scissors along its ulnar border extending into the distal forearm.  This widely opened the carpal canal.  No masses or other predicaments were noted.  Bleeding points along the margin of the released ligament were electrocauterized with bipolar current.  The wound was explored and the ulnar bursa was noted to be free of masses or other pathologies.  The wound was then repaired with intradermal 3-0 Prolene and a Steri-Strip.    Attention was then  directed to the elbow.  Initially, we fashioned a 3-cm incision to explore the ulnar nerve directly at the cubital tunnel.  Subcutaneous tissues were carefully divided, taking care to identify and gently retract the posterior branch of the medial antebrachial cutaneous nerve.  The ulnar nerve was easily identified by palpation and found to be grossly unstable with elbow flexion.  The diameter of the nerve was swollen from approximately a 1 cm diameter above the cubital tunnel to a 15-16 mm diameter deep to the arcuate ligament.  It was immediately apparent that we would need to perform a transposition due to the instability of the nerve and a rather robust triceps medial head.  We then extended the wound 2 cm proximally, 2 cm distally followed by meticulous subcutaneous dissection.  The ulnar nerve was mobilized from approximately 6 cm above the epicondyle to 6 cm distally.  We removed the fascia at the head of the flexor carpi ulnaris.  Mr. Grieshop had a retrograde articular branch, that to allow transposition, had to be sacrificed.  We preserved all the motor branches of the ulnar nerve as well as sensory branches distally.  A silicone vessel loop was used to mobilize the nerve on its pedicle. We carefully released the pedicle preserving as many longitudinal epineurial vessels as possible.  We mobilized the nerve over a segment of approximately 10 cm, transposing the nerve anterior to the epicondyle. Care was taken to formally resect the medial brachial intermuscular septum.  We created a generous pathway for the nerve avoiding kinking in all ranges of elbow motion from 0-120 degrees of elbow flexion.  Once hemostasis was achieved, we created a fascial and adipose sling that was repaired to the epicondyle assuring that the nerve did not sublux posteriorly with elbow extension.  We then examined the nerve carefully through a range of motion, 0-135 degrees of flexion, noting no signs  of kinking or compression of the nerve.  I passed a Valora Corporal along the nerve as well as a Insurance account manager and found no evidence of entrapment beneath the adipose and fascial sling.  The tourniquet was released and meticulous hemostasis was achieved.  Due to the fact that Mr. Goshorn has been taking aspirin for many years, we had quite a bit of bleeding in the subdermal plexus as well as in the former bed of the ulnar nerve due to release of the epineural feeding vessels.  This was all controlled by bipolar cautery and use of suture ligation of vessels with more than 0.5-mm caliber.  We held pressure on the wound for approximately 5 minutes.  We then repaired the skin with subcutaneous 4-0 Vicryl and intradermal 3-0 Prolene segmental sutures.  There were no signs of hematoma collection.  The wound was then anesthetized with 2% lidocaine followed by application of Steri-Strips, sterile gauze, Tegaderm and an Ace wrap at the elbow as well as a voluminous gauze dressing at the hand with Kerlix, sterile Webril, and a volar plaster splint maintaining the wrist in 15 degrees of dorsiflexion.  The hand dressing was completed with Coban.  There were no apparent complications.  Estimated blood loss was only a few mL.  Mr. Mooney was placed in a sling and will be transferred to the recovery room for observation of his vital signs.     Youlanda Mighty Orlinda Slomski, M.D.     RVS/MEDQ  D:  11/10/2013  T:  11/11/2013  Job:  [id number]

## 2014-02-15 ENCOUNTER — Ambulatory Visit (INDEPENDENT_AMBULATORY_CARE_PROVIDER_SITE_OTHER): Payer: BC Managed Care – PPO | Admitting: Emergency Medicine

## 2014-02-15 VITALS — BP 136/80 | HR 112 | Temp 98.9°F | Resp 16 | Ht 68.0 in | Wt 159.6 lb

## 2014-02-15 DIAGNOSIS — T148 Other injury of unspecified body region: Secondary | ICD-10-CM

## 2014-02-15 DIAGNOSIS — W57XXXA Bitten or stung by nonvenomous insect and other nonvenomous arthropods, initial encounter: Secondary | ICD-10-CM

## 2014-02-15 MED ORDER — METHYLPREDNISOLONE ACETATE 80 MG/ML IJ SUSP
120.0000 mg | Freq: Once | INTRAMUSCULAR | Status: AC
  Administered 2014-02-15: 120 mg via INTRAMUSCULAR

## 2014-02-15 MED ORDER — METHYLPREDNISOLONE SODIUM SUCC 125 MG IJ SOLR
125.0000 mg | Freq: Once | INTRAMUSCULAR | Status: AC
  Administered 2014-02-15: 125 mg via INTRAMUSCULAR

## 2014-02-15 NOTE — Patient Instructions (Signed)

## 2014-02-15 NOTE — Progress Notes (Signed)
[facility] [address], [place] [address] [phone]  Date:  02/15/2014   Name:  Philip Winters   DOB:  09-26-1960   MRN:  [id number]  PCP:  Gara Kroner, MD    Chief Complaint: Insect Bite   History of Present Illness:  Philip Winters is a 53 y.o. very pleasant male patient who presents with the following:  Stung yesterday morning by a "bee" and has swelling and erythema of fourth right finger. No systemic symptoms.  No history of severe evenomation No improvement with over the counter medications or other home remedies. . Denies other complaint or health concern today.   Patient Active Problem List   Diagnosis Date Noted  . HYPERLIPIDEMIA 01/18/2007  . HYPERTENSION 01/18/2007    Past Medical History  Diagnosis Date  . Hypertension   . Seasonal allergies   . Allergy     Past Surgical History  Procedure Laterality Date  . Knee arthroscopy  [id number]    left and right  . Colonoscopy    . Carpal tunnel with cubital tunnel Left 11/10/2013    Procedure: LEFT CARPAL TUNNEL RELEASE;  Surgeon: Cammie Sickle, MD;  Location: [place];  Service: Orthopedics;  Laterality: Left;  . Trigger finger release Left 11/10/2013    Procedure: LEFT INDEX A-1 PULLEY RELEASE;  Surgeon: Cammie Sickle, MD;  Location: [place];  Service: Orthopedics;  Laterality: Left;  . Ulnar nerve transposition Left 11/10/2013    Procedure: ULNAR NERVE TRANSPOSITION;  Surgeon: Cammie Sickle, MD;  Location: [place];  Service: Orthopedics;  Laterality: Left;    History  Substance Use Topics  . Smoking status: Current Every Day Smoker -- 1.00 packs/day    Types: Cigarettes  . Smokeless tobacco: Not on file  . Alcohol Use: Yes     Comment: occ    History reviewed. No pertinent family history.  Allergies  Allergen Reactions  . Penicillins Shortness Of Breath  . Codeine Nausea And Vomiting  . Iodine Rash     Medication list has been reviewed and updated.  Current Outpatient Prescriptions on File Prior to Visit  Medication Sig Dispense Refill  . aspirin 81 MG tablet Take 81 mg by mouth daily.      . diphenhydrAMINE (BENADRYL) 25 MG tablet Take 25 mg by mouth every 6 (six) hours as needed.      . fluocinonide-emollient (LIDEX-E) 0.05 % cream Apply topically 2 (two) times daily.  30 g  0  . valsartan (DIOVAN) 320 MG tablet Take 320 mg by mouth daily.      Marland Kitchen doxycycline (VIBRA-TABS) 100 MG tablet Take 1 tablet (100 mg total) by mouth 2 (two) times daily.  8 tablet  0  . HYDROmorphone (DILAUDID) 2 MG tablet Take 1 tablet (2 mg total) by mouth every 4 (four) hours as needed.  30 tablet  0   No current facility-administered medications on file prior to visit.    Review of Systems:  As per HPI, otherwise negative.    Physical Examination: Filed Vitals:   02/15/14 1404  BP: 136/80  Pulse: 112  Temp: 98.9 F (37.2 C)  Resp: 16   Filed Vitals:   02/15/14 1404  Height: 5\' 8"  (1.727 m)  Weight: 159 lb 9.6 oz (72.394 kg)   Body mass index is 24.27 kg/(m^2). Ideal Body Weight: Weight in (lb) to have BMI = 25: 164.1  GEN: WDWN, NAD, Non-toxic, Alert & Oriented x 3 HEENT: Atraumatic, Normocephalic.  Ears and Nose: No external deformity. EXTR: No clubbing/cyanosis/edema NEURO: Normal gait.  PSYCH: Normally interactive. Conversant. Not depressed or anxious appearing.  Calm demeanor.  RIGHT fourth finger swollen and red.    Assessment and Plan: Insect sting Steroids Continue bendadryl  Signed,  Ellison Carwin, MD

## 2014-06-30 ENCOUNTER — Other Ambulatory Visit: Payer: Self-pay | Admitting: Family Medicine

## 2014-06-30 ENCOUNTER — Ambulatory Visit
Admission: RE | Admit: 2014-06-30 | Discharge: 2014-06-30 | Disposition: A | Discharge status: Home / Self Care | Payer: PRIVATE HEALTH INSURANCE | Source: Ambulatory Visit | Attending: Family Medicine | Admitting: Family Medicine

## 2014-06-30 DIAGNOSIS — R52 Pain, unspecified: Secondary | ICD-10-CM

## 2014-06-30 IMAGING — CT CT ABD-PELV W/O CM
2 of 4 series · 13 of 32 positions shown, 19 images · non-contrast
Comparison: None.

CLINICAL DATA: Left flank pain since [REDACTED].

EXAM:
CT ABDOMEN AND PELVIS WITHOUT CONTRAST
TECHNIQUE: Multidetector CT imaging of the abdomen and pelvis was performed
following the standard protocol without IV contrast.

[Series 400: sag · sagittal · 0.84mm/px · 10 of 125 slices shown, 16 images]
[im 12/125  soft-tissue]
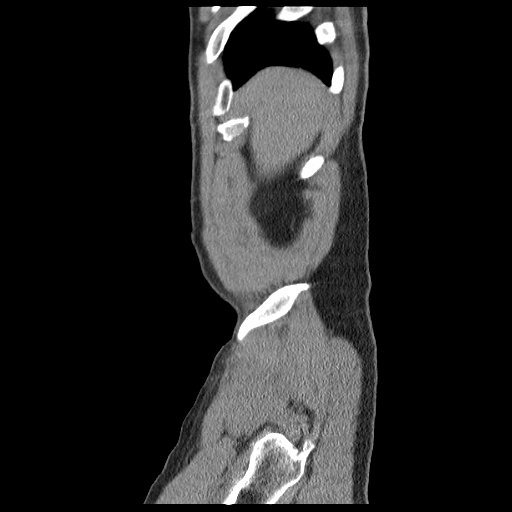
[im 12/125  lung]
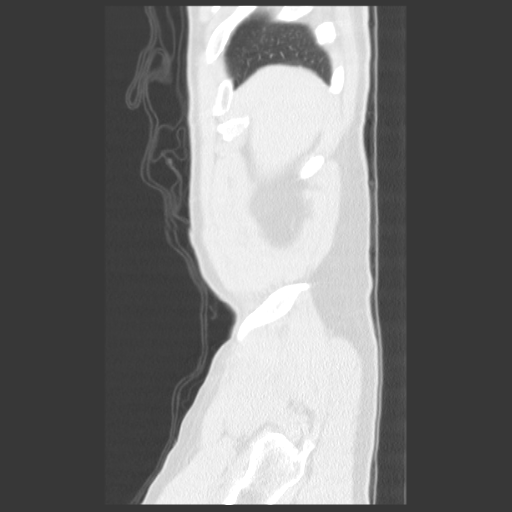
[im 12/125  bone]
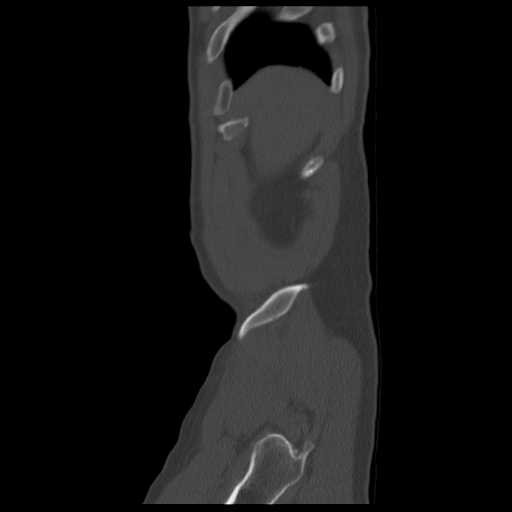
[im 23/125  soft-tissue]
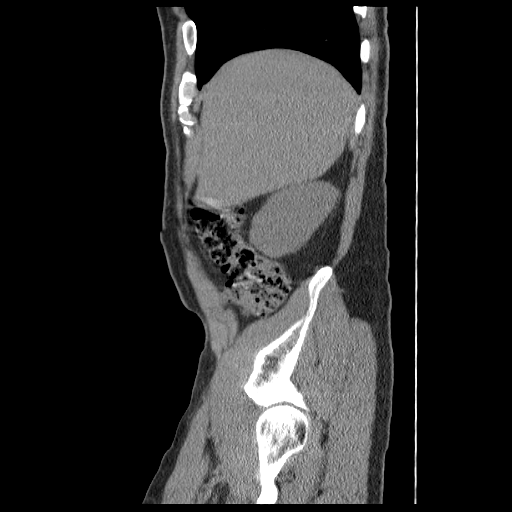
[im 23/125  lung]
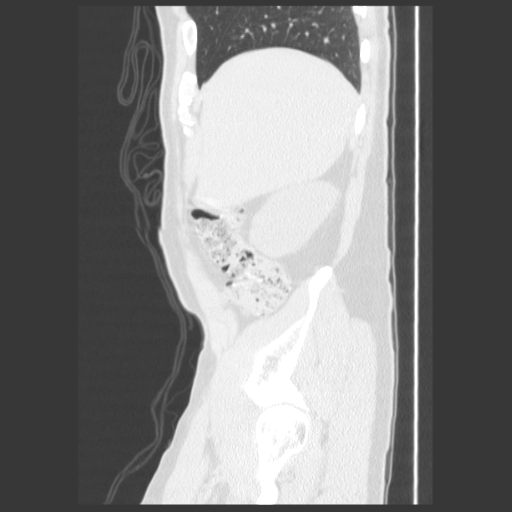
[im 34/125  soft-tissue]
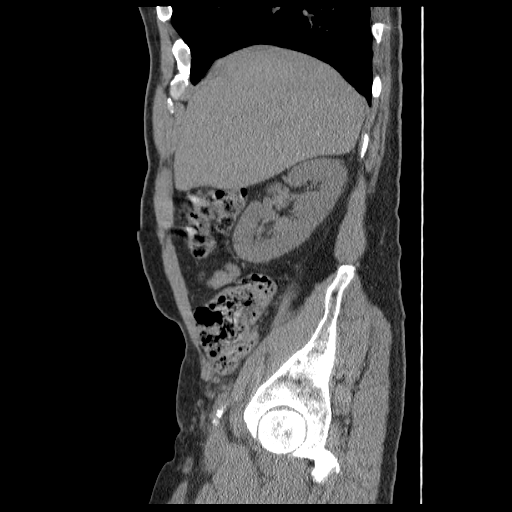
[im 34/125  lung]
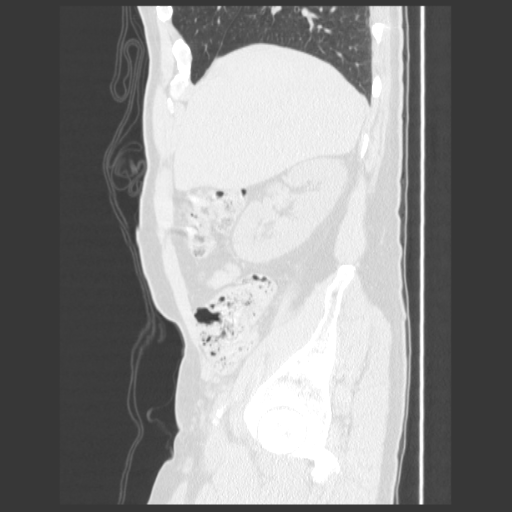
[im 46/125  soft-tissue]
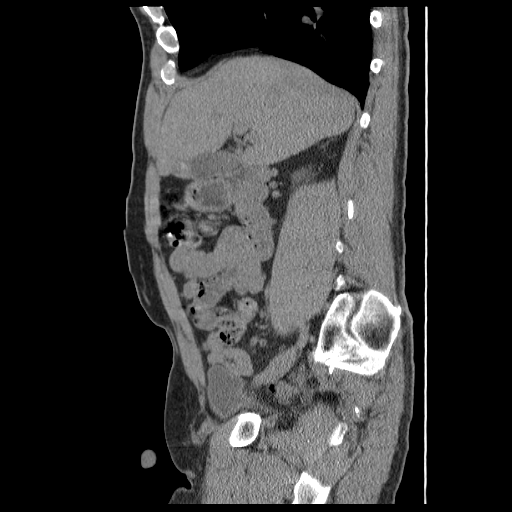
[im 46/125  lung]
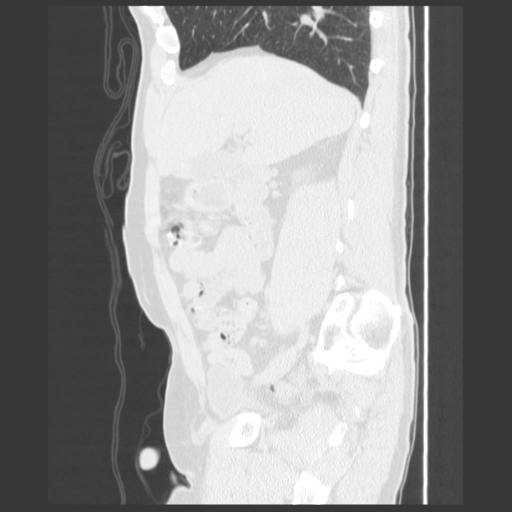
[im 57/125  soft-tissue]
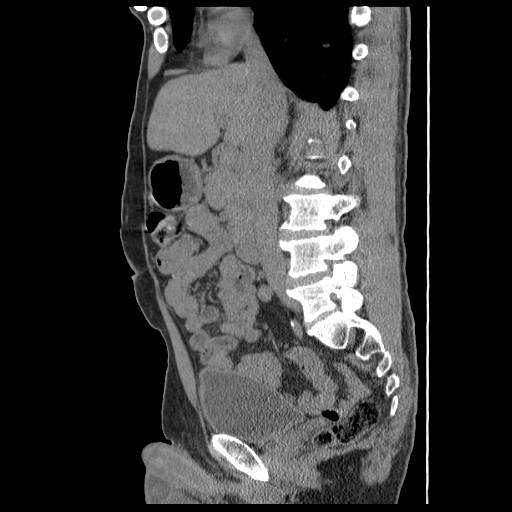
[im 68/125  soft-tissue]
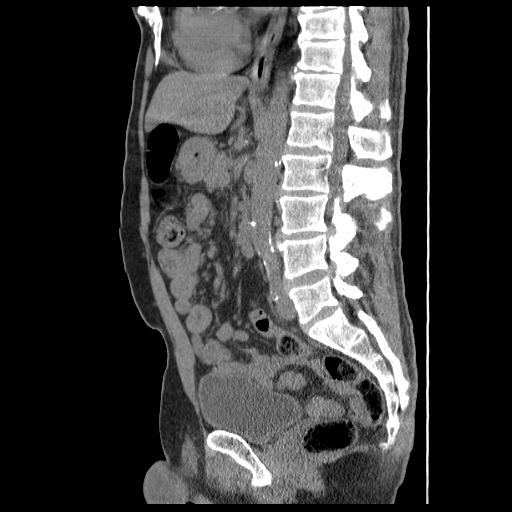
[im 79/125  soft-tissue]
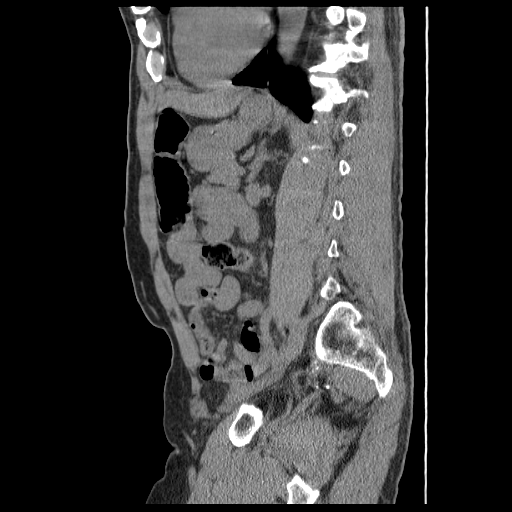
[im 91/125  soft-tissue]
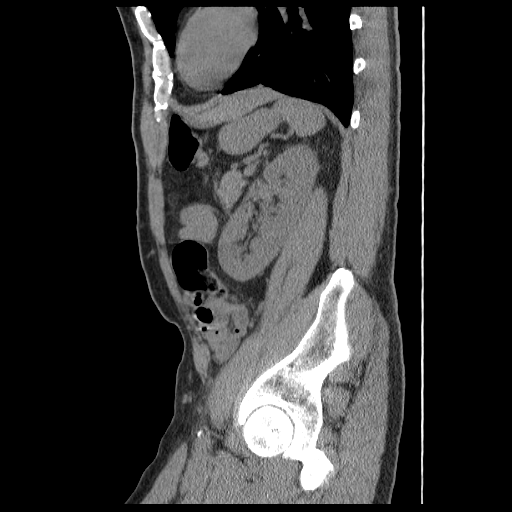
[im 102/125  soft-tissue]
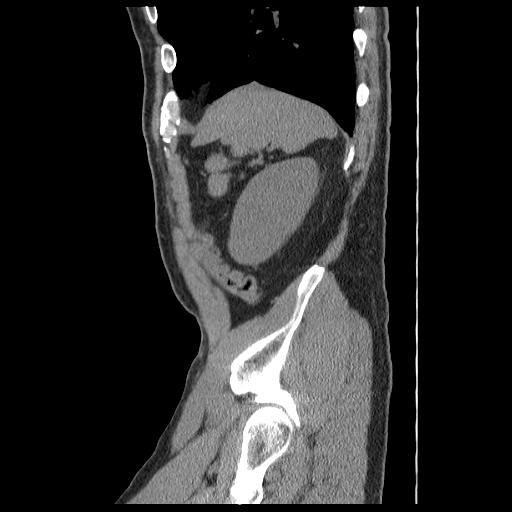
[im 102/125  bone]
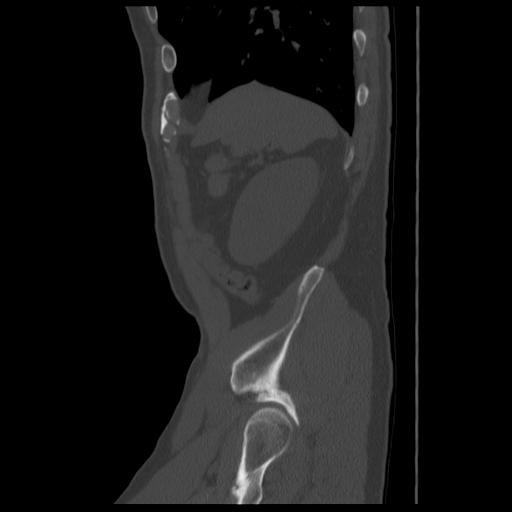
[im 113/125  soft-tissue]
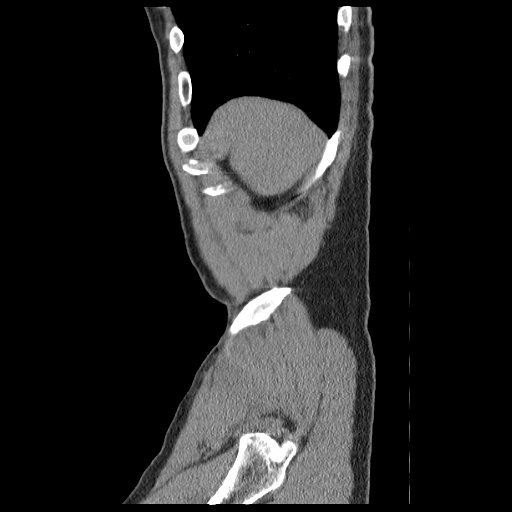

[Series 401: cor · coronal · 0.84mm/px · 3 of 99 slices shown]
[im 11/99  soft-tissue]
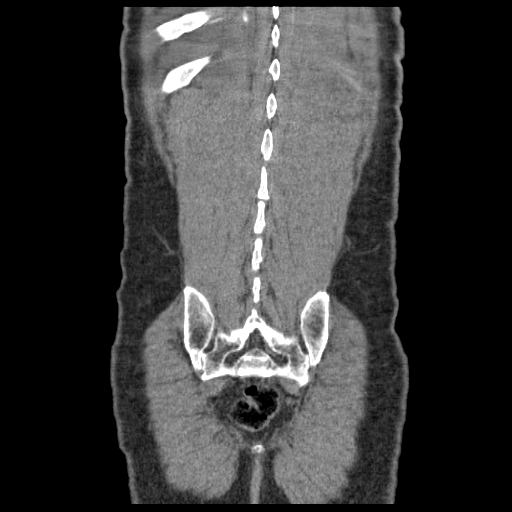
[im 22/99  soft-tissue]
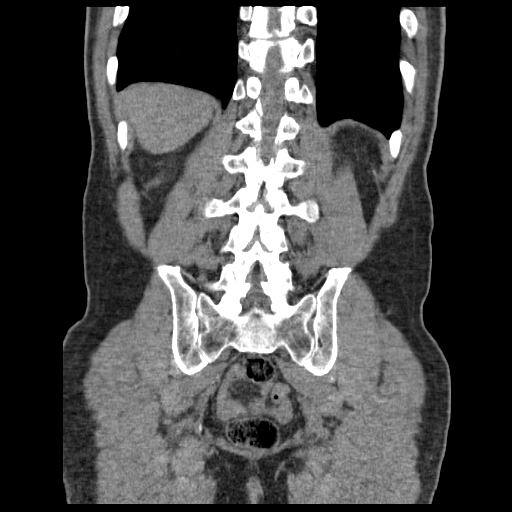
[im 33/99  soft-tissue]
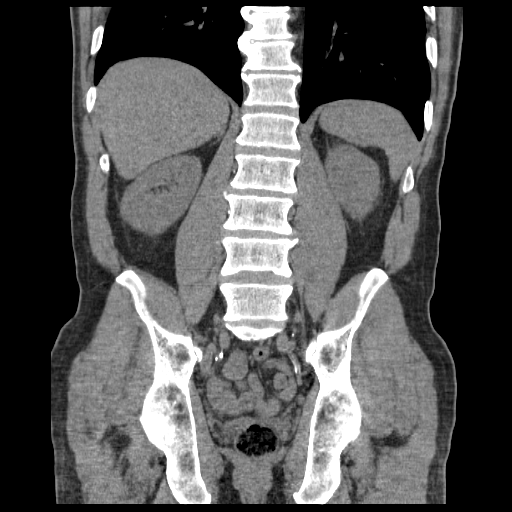

[13 of 32 positions shown; findings below may reference images not displayed]

FINDINGS: There is a 17 mm area of abnormality of the tip of the gallbladder
which probably represents a chronically inflamed Phrygian cap with a
small stone within it. The rest of the gallbladder appears normal.
Liver, bile ducts, spleen, pancreas, adrenal glands, and kidneys are
normal.

There are no renal or ureteral calculi. No bladder calculi. No
hydronephrosis.

There is atheromatous calcification in the distal abdominal aorta
and common iliac arteries. The bladder and prostate gland appear
normal. The bowel appears normal including the terminal ileum and
appendix.

Free air or free fluid or adenopathy. No acute osseous
abnormalities.
IMPRESSION: No acute abnormalities. Probable chronic inflammation of a Phrygian
cap of the gallbladder, an anatomic variant. There is a stone in the
Phrygian cap.

## 2014-07-29 ENCOUNTER — Other Ambulatory Visit (HOSPITAL_COMMUNITY): Payer: Self-pay | Admitting: Physical Medicine and Rehabilitation

## 2014-07-29 ENCOUNTER — Ambulatory Visit (HOSPITAL_COMMUNITY)
Admission: RE | Admit: 2014-07-29 | Discharge: 2014-07-29 | Disposition: A | Discharge status: Home / Self Care | Payer: 59 | Source: Ambulatory Visit | Attending: Physical Medicine and Rehabilitation | Admitting: Physical Medicine and Rehabilitation

## 2014-07-29 DIAGNOSIS — T1590XD Foreign body on external eye, part unspecified, unspecified eye, subsequent encounter: Secondary | ICD-10-CM

## 2014-07-29 DIAGNOSIS — T1590XA Foreign body on external eye, part unspecified, unspecified eye, initial encounter: Secondary | ICD-10-CM | POA: Insufficient documentation

## 2014-07-29 DIAGNOSIS — X58XXXA Exposure to other specified factors, initial encounter: Secondary | ICD-10-CM | POA: Insufficient documentation

## 2014-07-29 IMAGING — CR DG ORBITS FOR FOREIGN BODY
2 series · 2 of 2 positions shown · non-contrast
Comparison: None.

CLINICAL DATA: Metal working/exposure; clearance prior to MRI.
Initial encounter.

EXAM:
ORBITS FOR FOREIGN BODY - 2 VIEW

[w waters (1 of 2)]
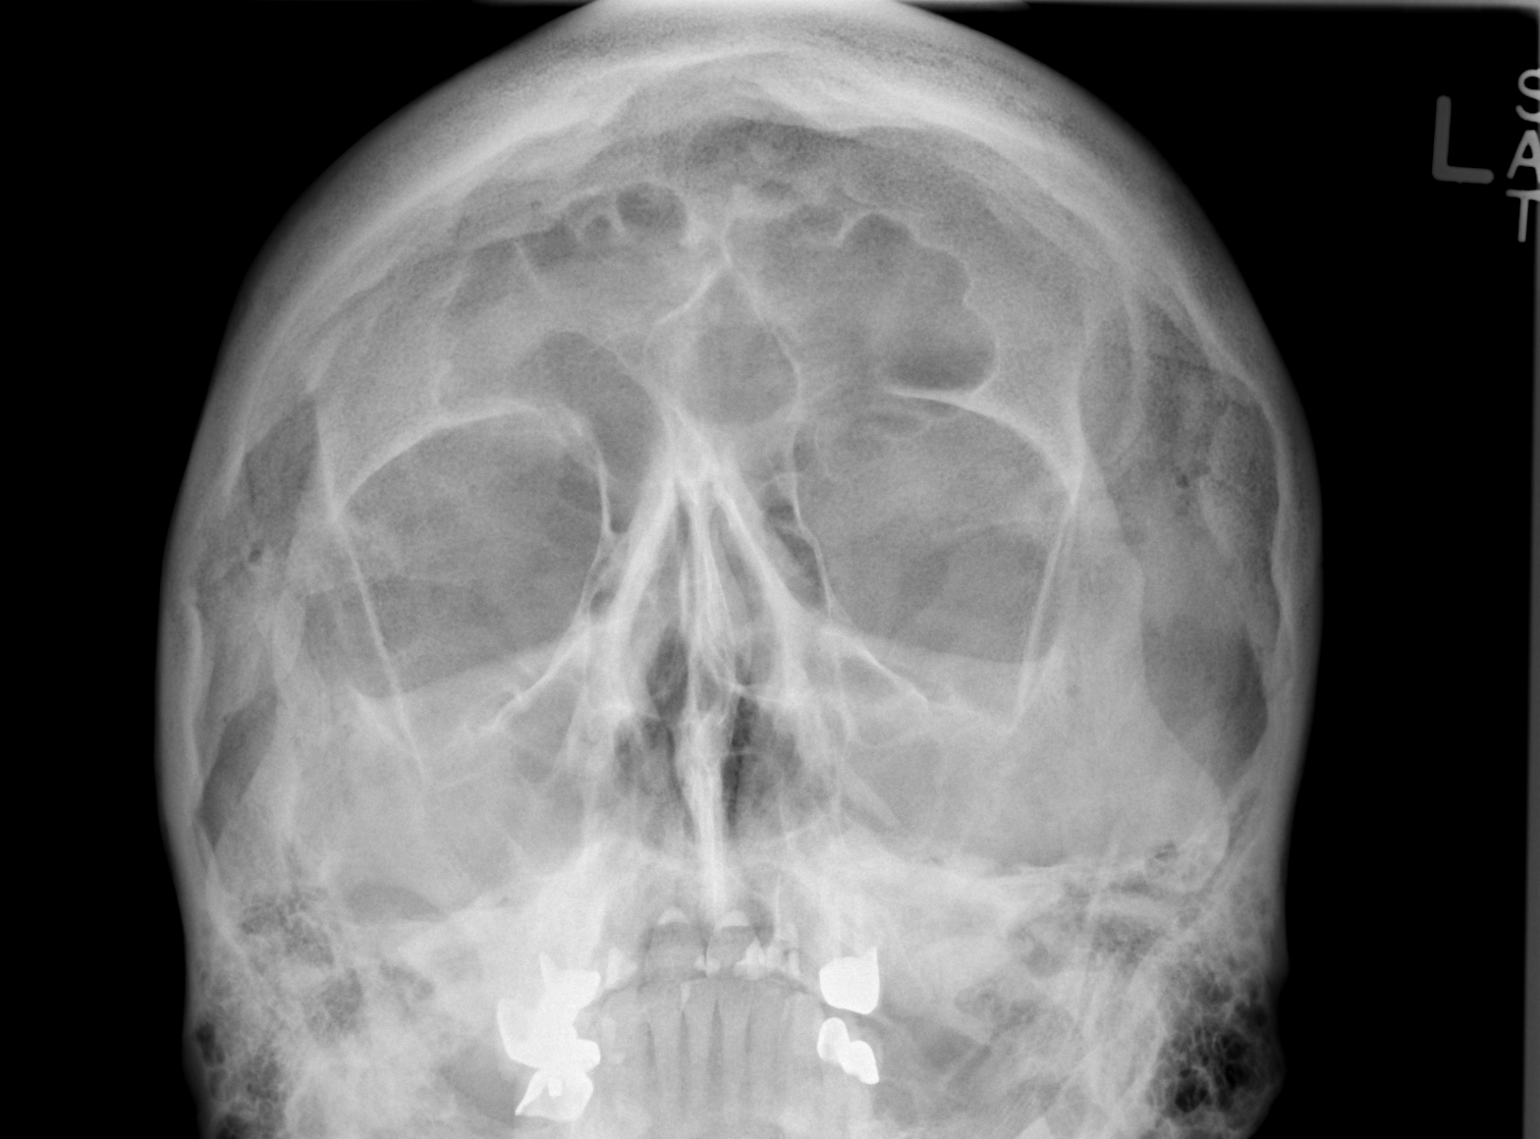

[w waters (2 of 2)]
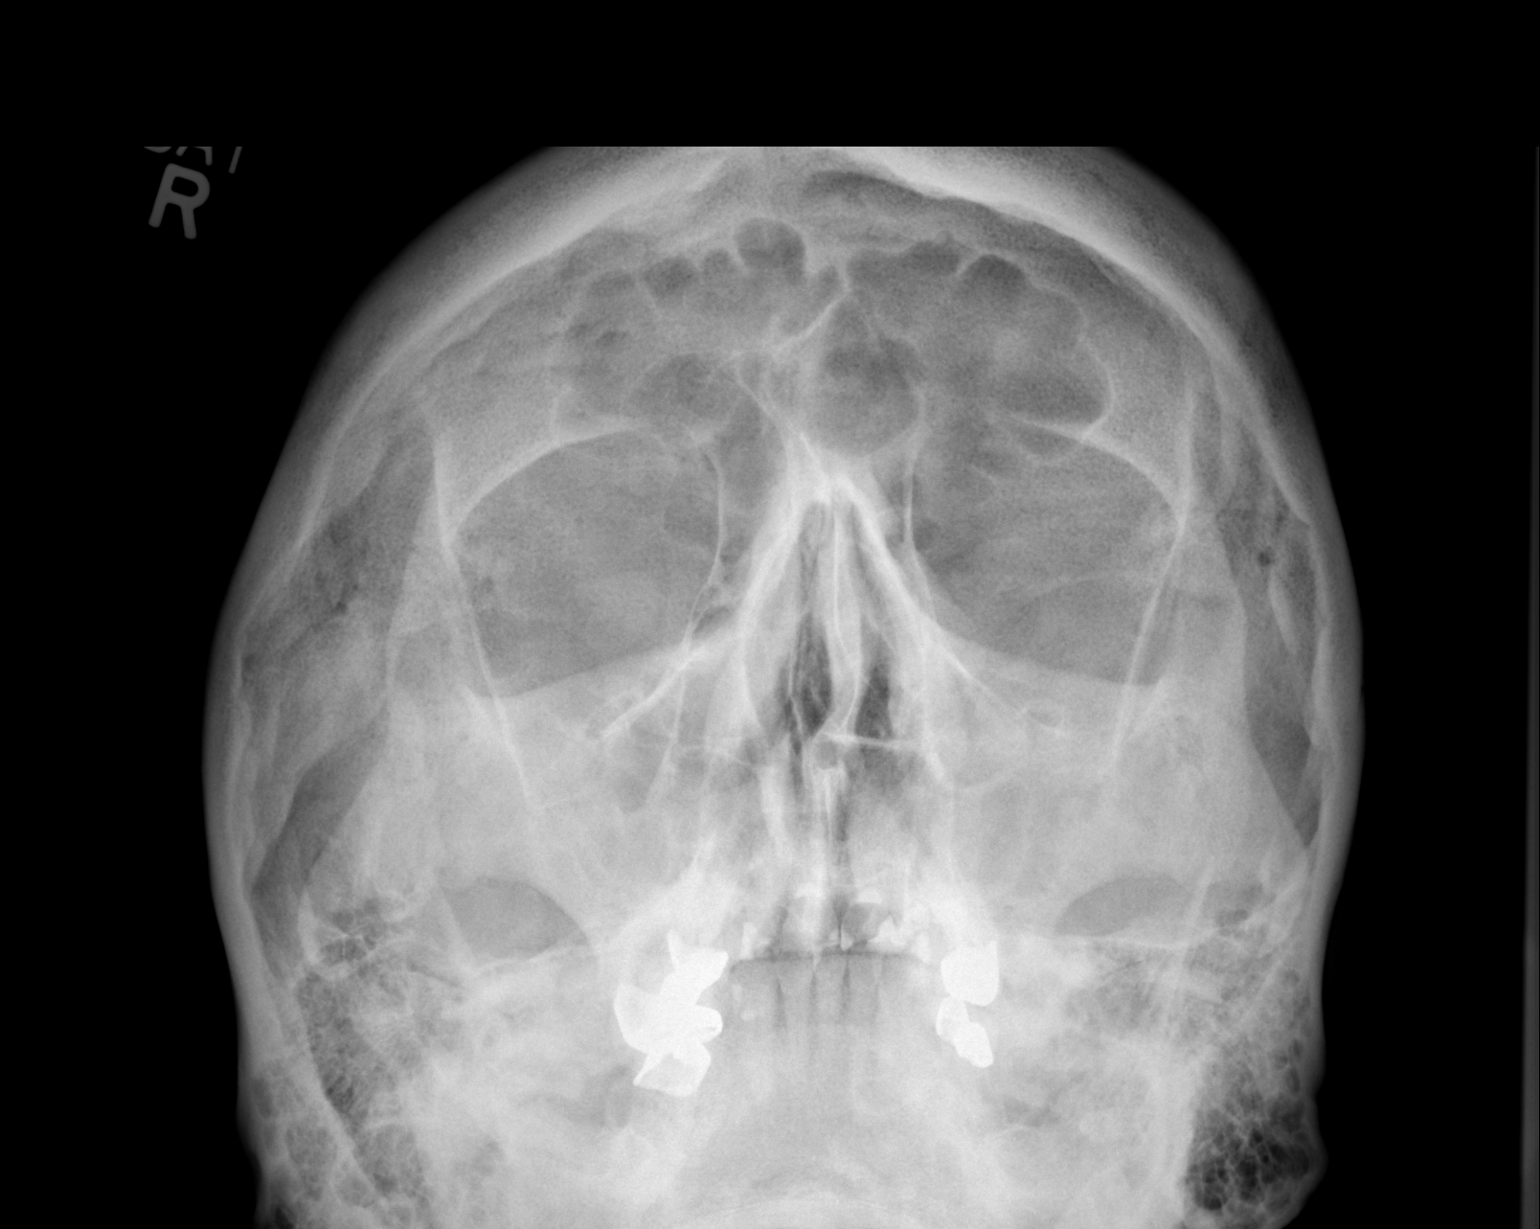

[2 of 2 positions shown; findings below may reference images not displayed]

FINDINGS: There is no evidence of metallic foreign body within the orbits. No
significant bone abnormality is identified. The visualized paranasal
sinuses and mastoid air cells are well aerated.
IMPRESSION: No evidence of metallic foreign body within the orbits.

## 2014-08-29 IMAGING — CR DG SINUSES COMPLETE 3+V
4 series · 4 of 4 positions shown · non-contrast
Comparison: [DATE]

CLINICAL DATA: Progressive headache over the last few days.

EXAM:
PARANASAL SINUSES - COMPLETE 3 + VIEW

[PA]
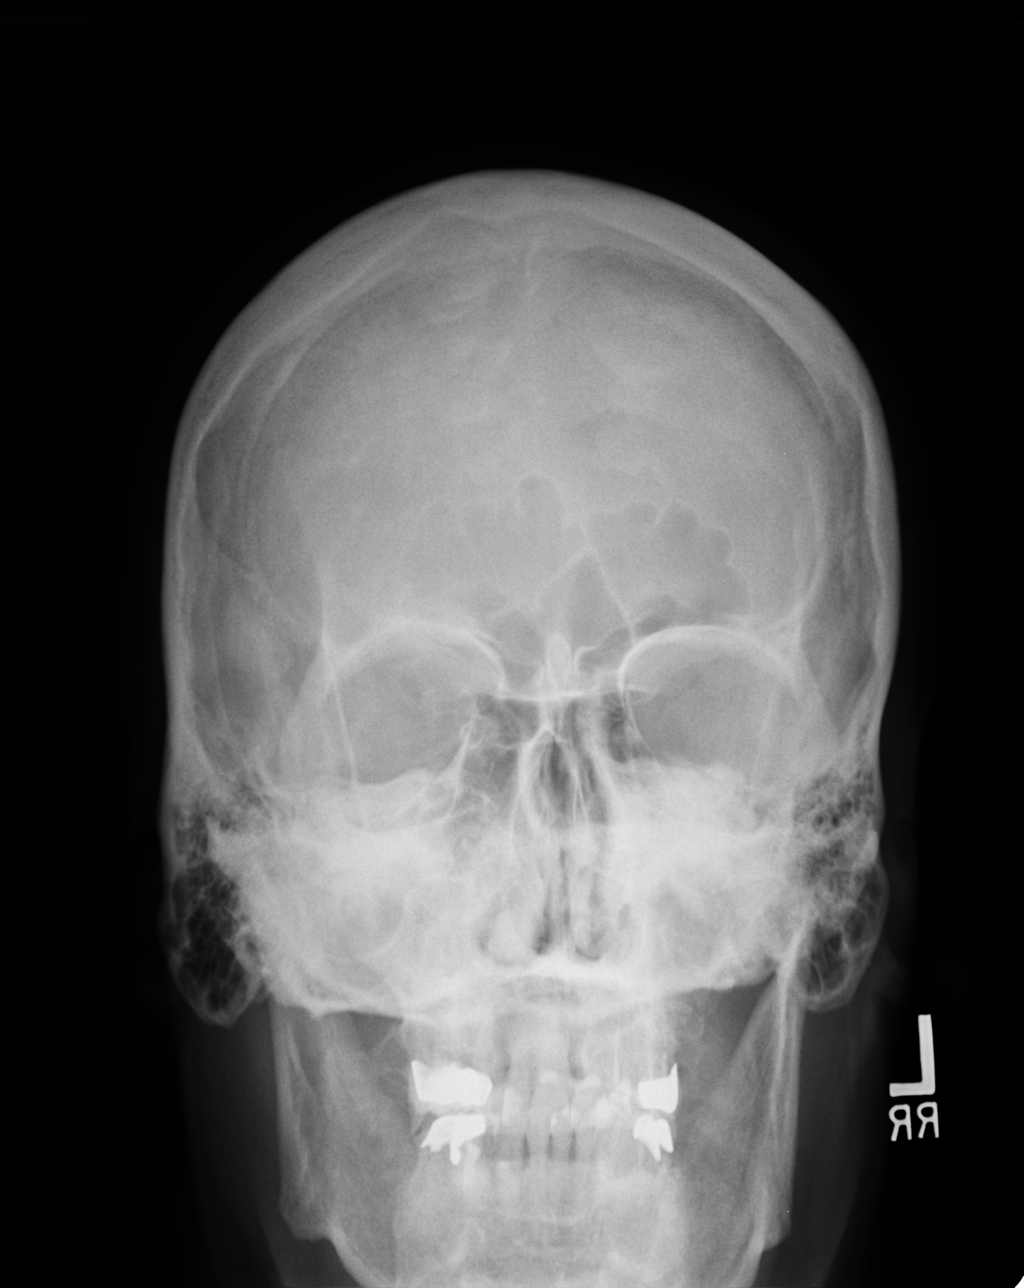

[waters]
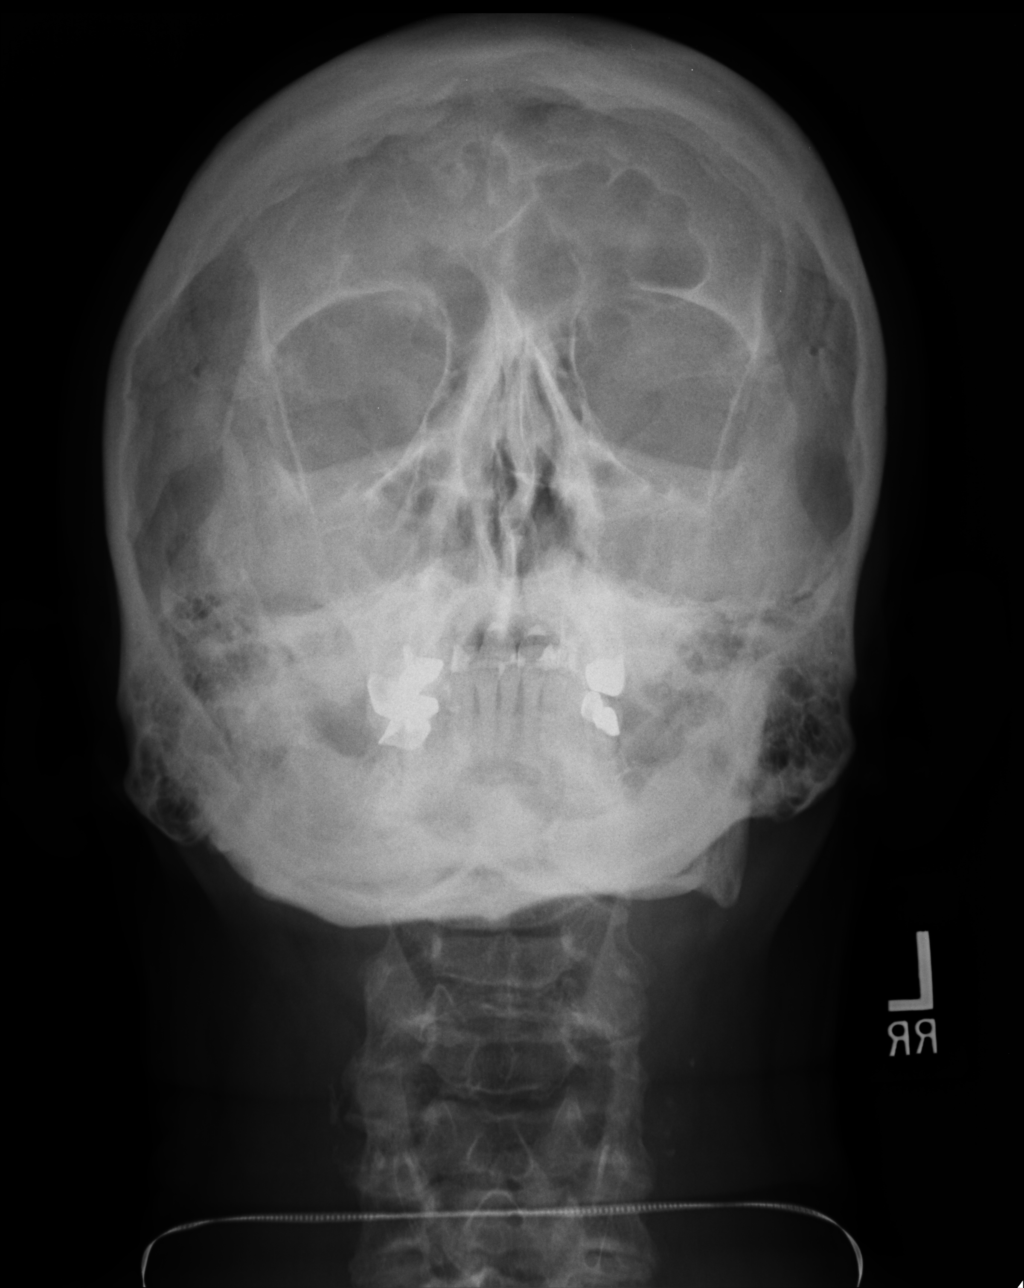

[lateral]
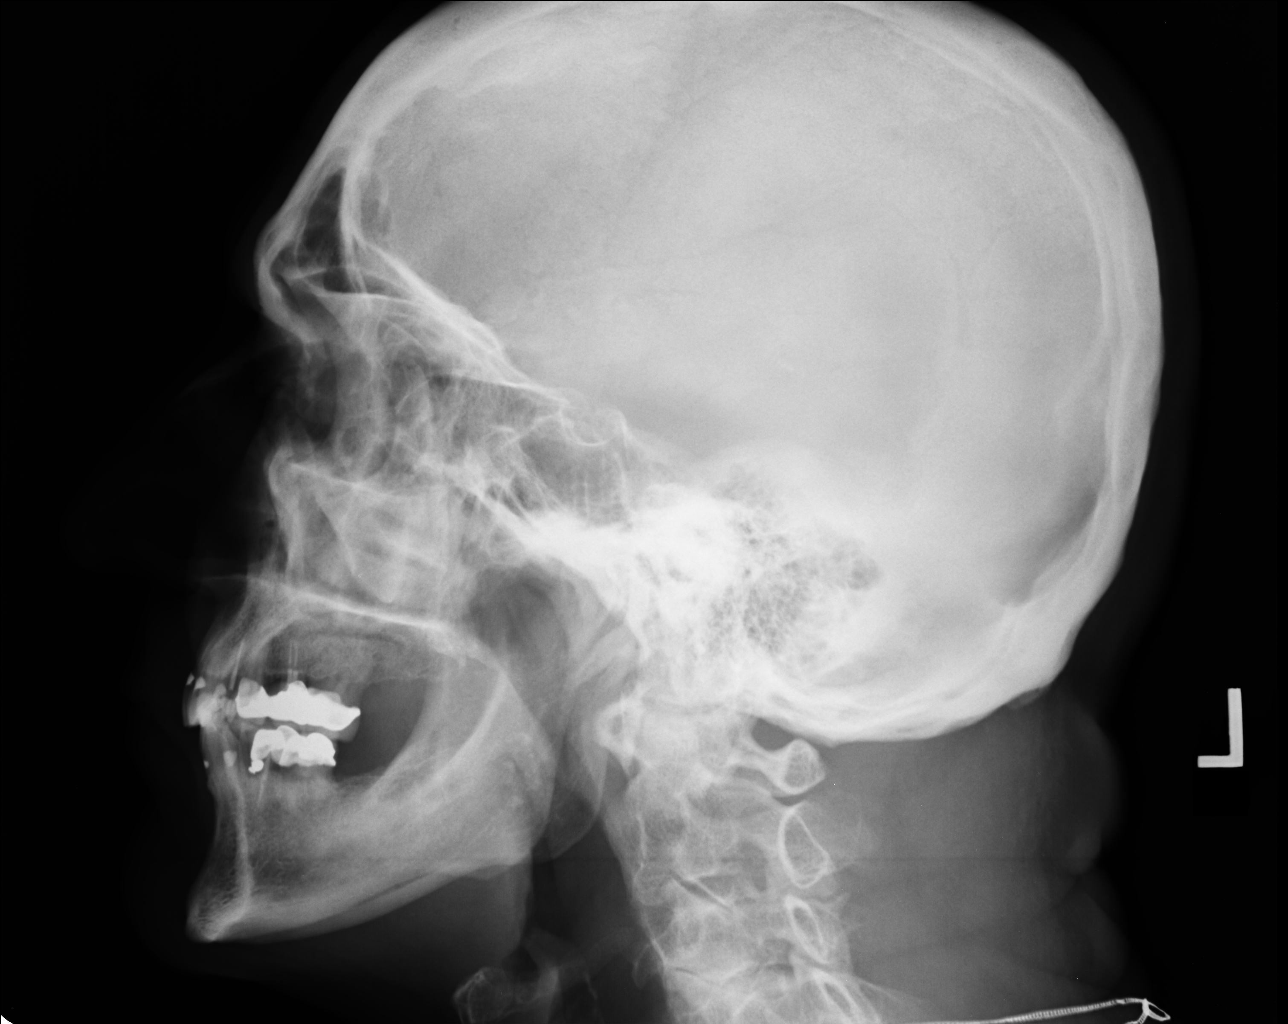

[smv]
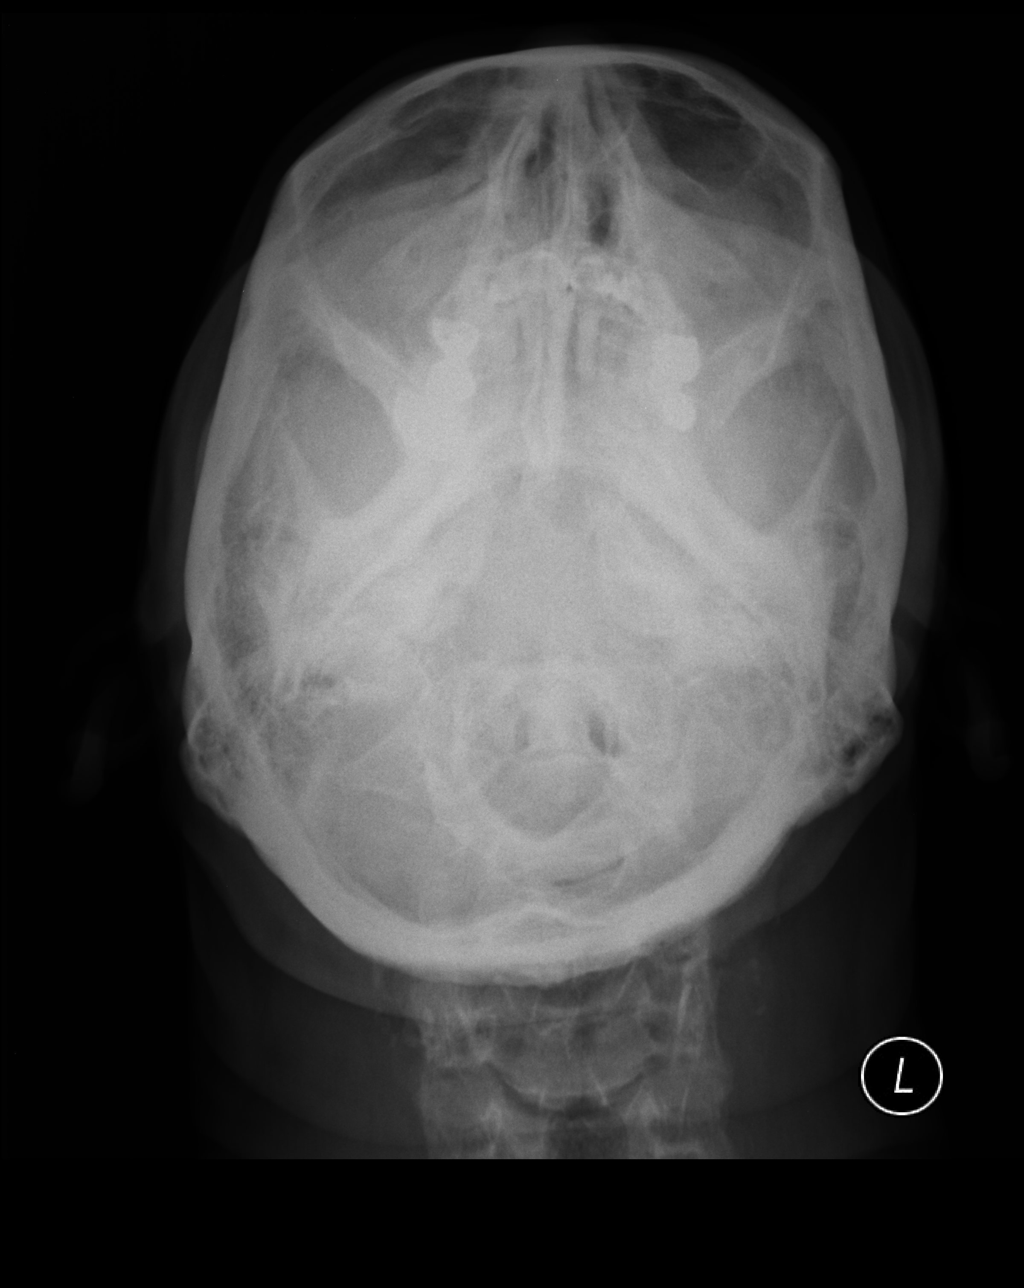

[4 of 4 positions shown; findings below may reference images not displayed]

FINDINGS: Indistinctness of the maxillary sinuses raises the possibility of
maxillary sinusitis, particularly on the right. No overt clouding of
the remaining sinuses.
IMPRESSION: 1. I am suspicious for chronic maxillary sinusitis, right greater
than left.

## 2014-10-20 ENCOUNTER — Ambulatory Visit (INDEPENDENT_AMBULATORY_CARE_PROVIDER_SITE_OTHER): Payer: PRIVATE HEALTH INSURANCE

## 2014-10-20 ENCOUNTER — Ambulatory Visit (INDEPENDENT_AMBULATORY_CARE_PROVIDER_SITE_OTHER): Payer: PRIVATE HEALTH INSURANCE | Admitting: Emergency Medicine

## 2014-10-20 VITALS — BP 126/78 | HR 100 | Temp 97.8°F | Resp 17 | Ht 68.0 in | Wt 155.0 lb

## 2014-10-20 DIAGNOSIS — M25521 Pain in right elbow: Secondary | ICD-10-CM | POA: Diagnosis not present

## 2014-10-20 LAB — POCT CBC
Granulocyte percent: 53.6 %G (ref 37–80)
HCT, POC: 45.2 % (ref 43.5–53.7)
Hemoglobin: 15.2 g/dL (ref 14.1–18.1)
Lymph, poc: 1.7 (ref 0.6–3.4)
MCH, POC: 32.5 pg — AB (ref 27–31.2)
MCHC: 33.6 g/dL (ref 31.8–35.4)
MCV: 96.6 fL (ref 80–97)
MID (cbc): 0.2 (ref 0–0.9)
MPV: 6.2 fL (ref 0–99.8)
POC Granulocyte: 2.3 (ref 2–6.9)
POC LYMPH PERCENT: 41 %L (ref 10–50)
POC MID %: 5.4 %M (ref 0–12)
Platelet Count, POC: 245 10*3/uL (ref 142–424)
RBC: 4.68 M/uL — AB (ref 4.69–6.13)
RDW, POC: 12.2 %
WBC: 4.2 10*3/uL — AB (ref 4.6–10.2)

## 2014-10-20 IMAGING — CR DG ELBOW COMPLETE 3+V*R*
3 series · 3 of 3 positions shown · non-contrast
Comparison: None.

CLINICAL DATA: Pain of uncertain etiology. No history of recent
trauma

EXAM:
RIGHT ELBOW - COMPLETE 3+ VIEW

[AP]
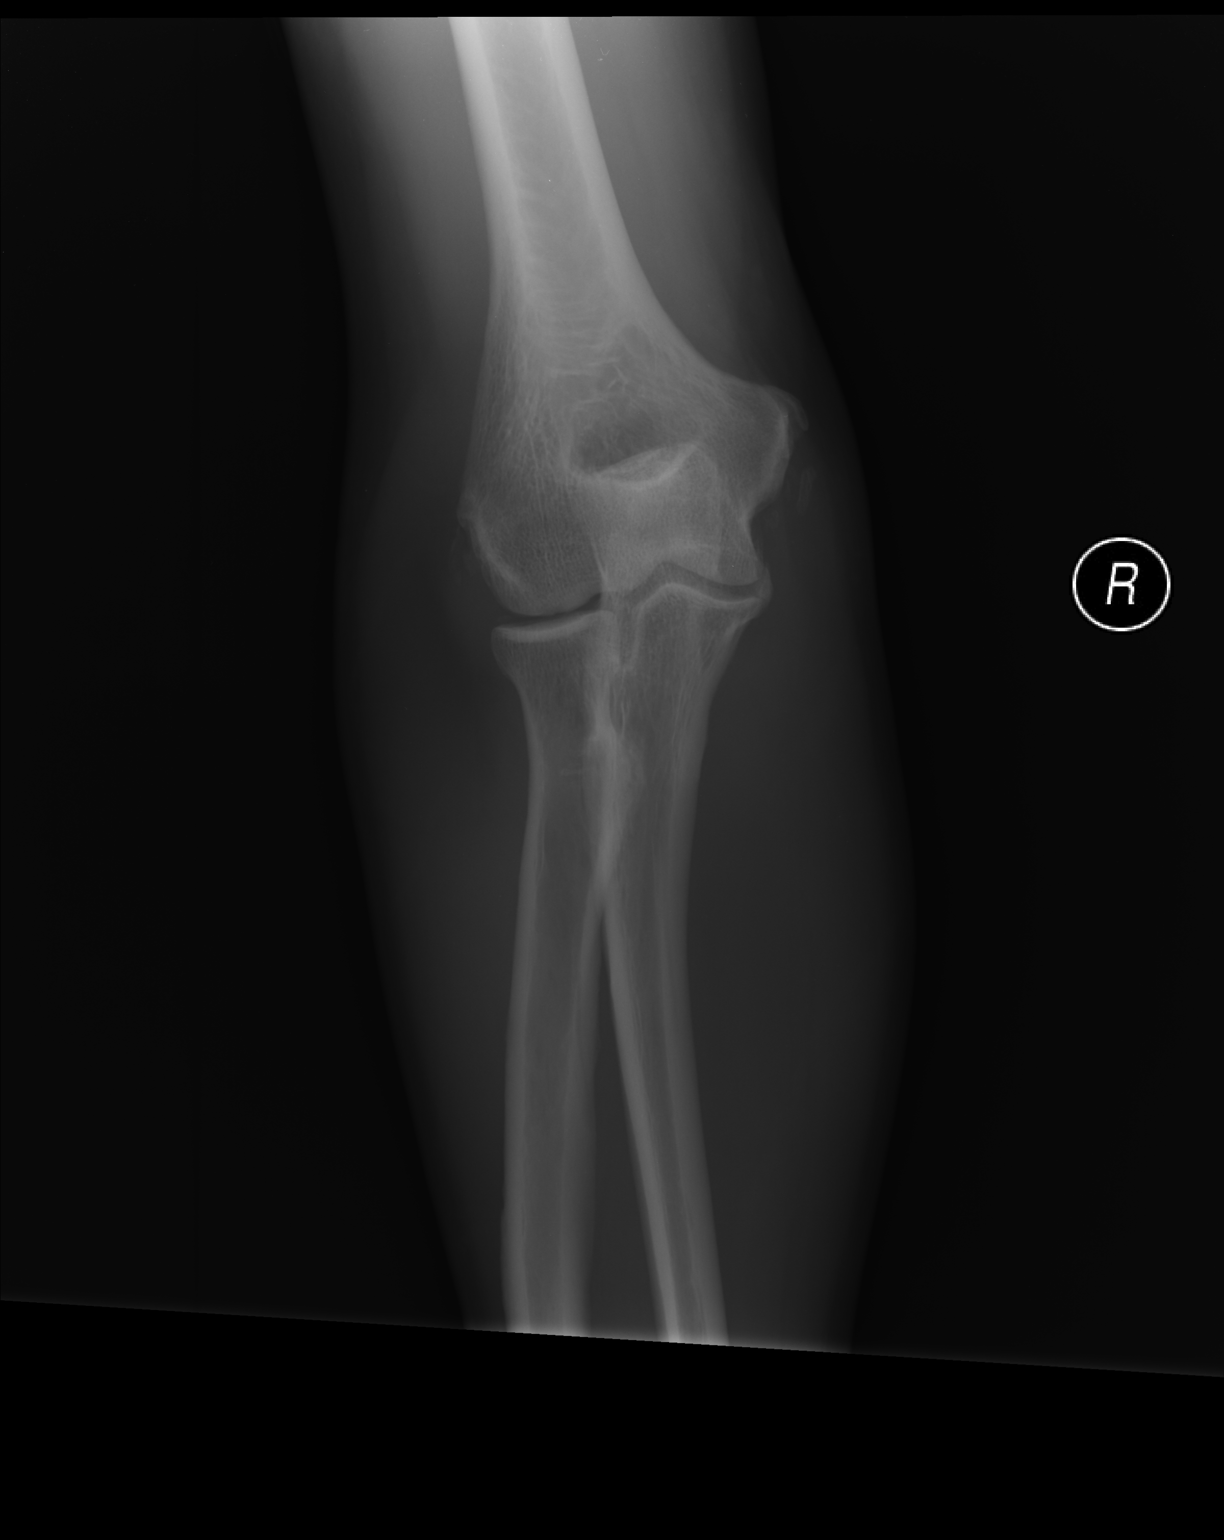

[ap obl ext rot]
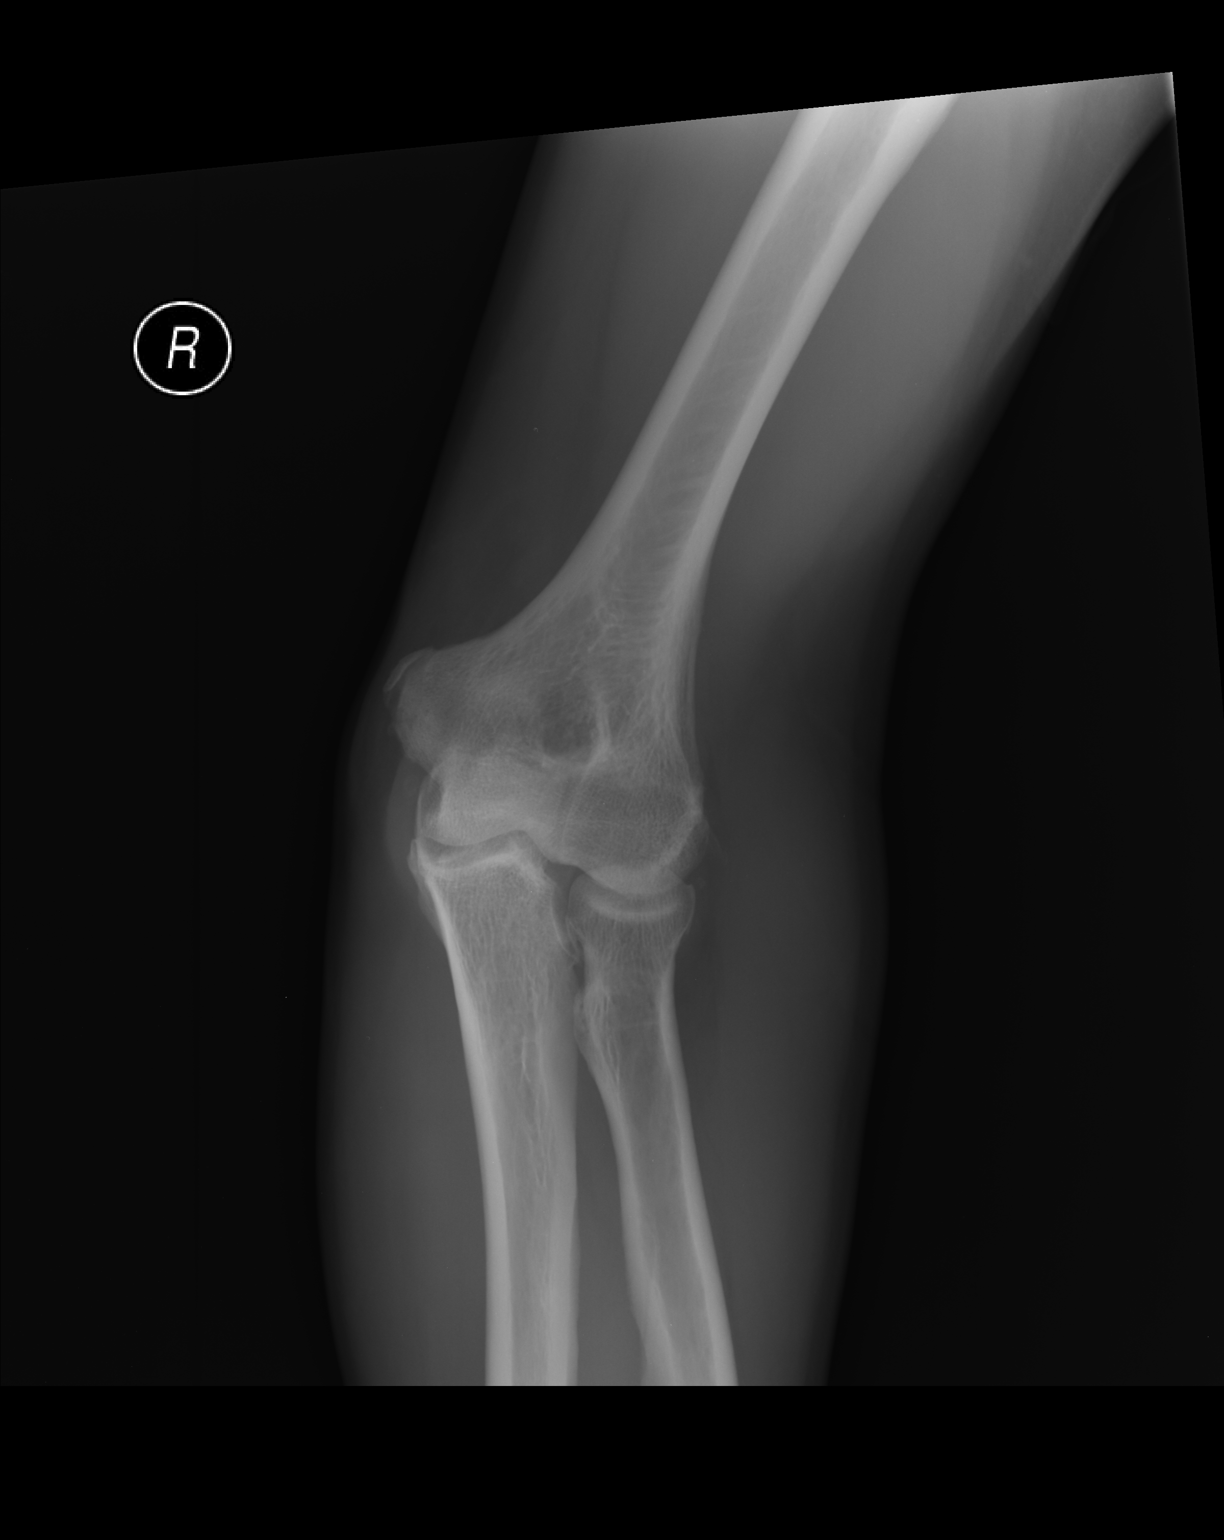

[lateral]
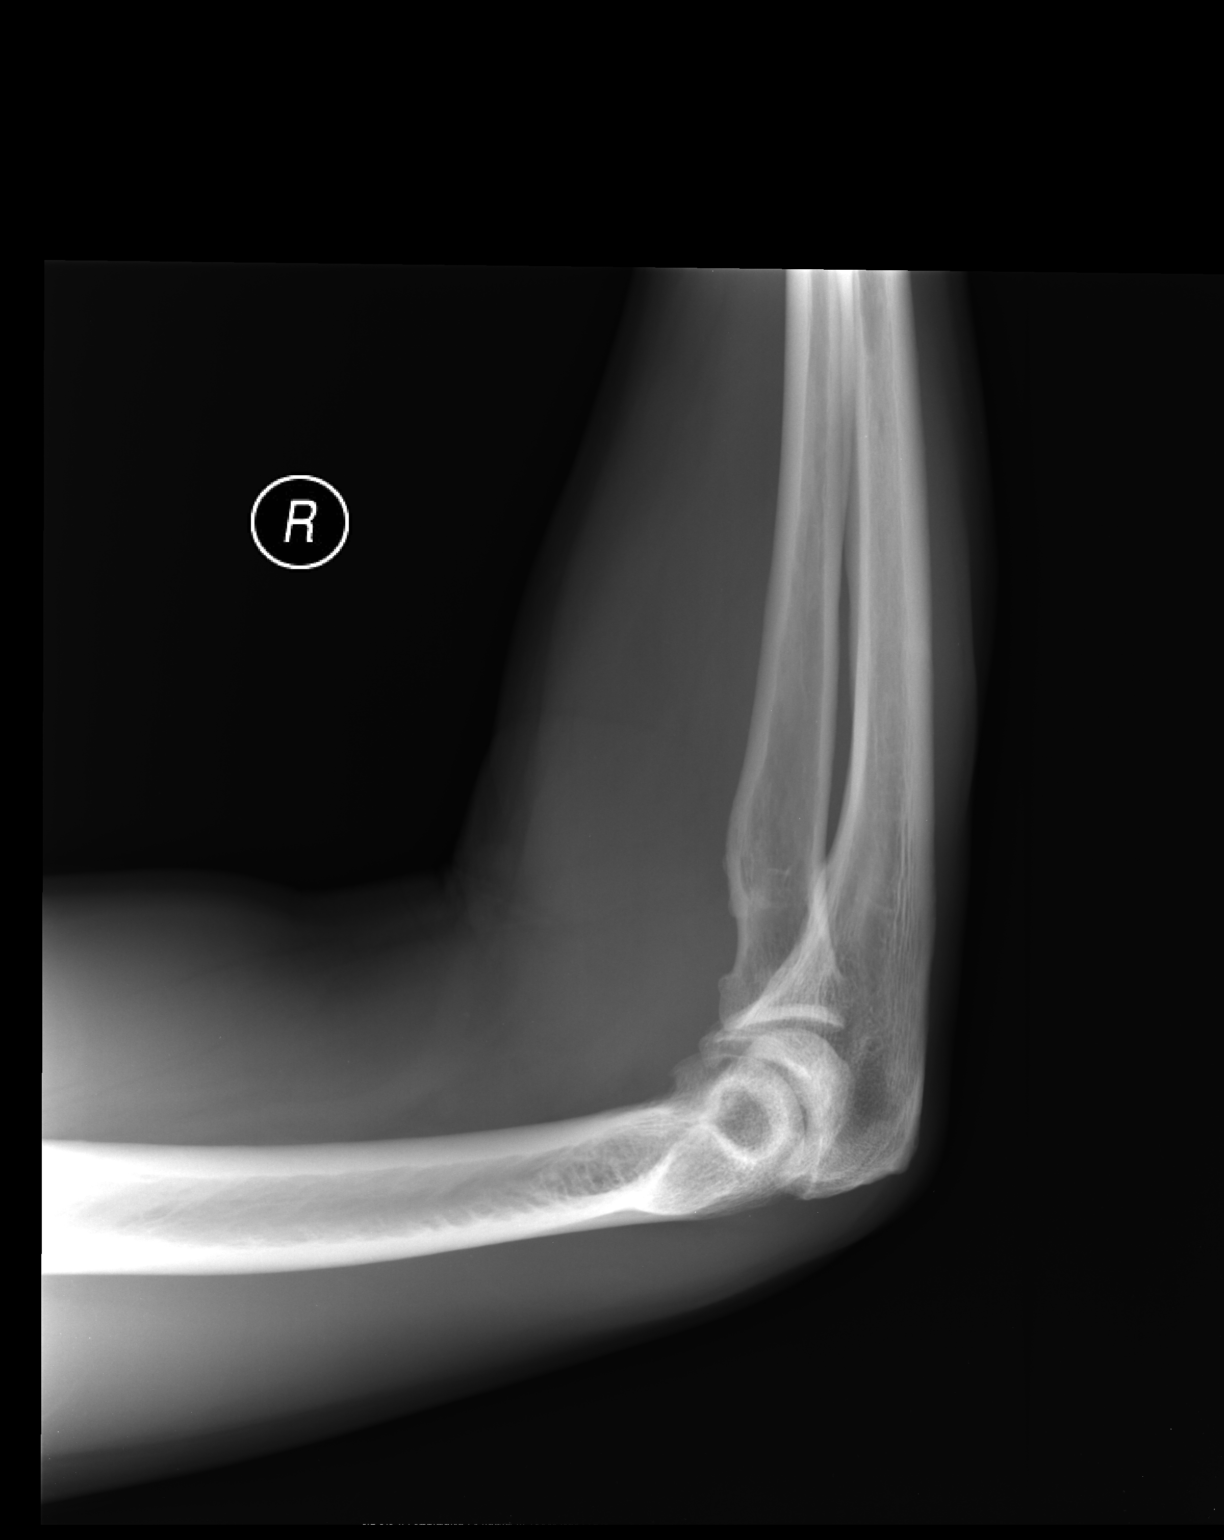

[3 of 3 positions shown; findings below may reference images not displayed]

FINDINGS: Frontal, oblique, and lateral views obtained. No fracture or
dislocation. No joint effusion. There is spur formation along the
medial distal humeral condyle. There is also calcification in the
soft tissues slightly lateral to the distal humeral condyle. More
subtle calcification is noted lateral to the distal humeral condyle.
No erosive change.
IMPRESSION: Areas of calcific tendinosis. No fracture or effusion. No erosive
change.

## 2014-10-20 MED ORDER — NAPROXEN SODIUM 550 MG PO TABS: 550.0000 mg | ORAL_TABLET | Freq: Two times a day (BID) | ORAL | Status: DC

## 2014-10-20 MED ORDER — HYDROCODONE-ACETAMINOPHEN 5-325 MG PO TABS: 1.0000 | ORAL_TABLET | ORAL | Status: DC | PRN

## 2014-10-20 NOTE — Progress Notes (Signed)
Subjective:  Patient ID: Philip Winters, male    DOB: April 11, 1961  Age: 54 y.o. MRN: [id number]  CC: Insect Bite and Elbow Injury   HPI [name] presents  for evaluation of his elbow injury. He doesn't have any recollection of an acute injury to his elbow. He thinks by a spider. He has some swelling and erythema of the right elbow. He has limited extension and flexion of the elbow. His tenderness in his elbow. He has no fever or chills.  He was under spouse thinks is where he was bitten. There is no evidence of a bite mark however he does have an abrasion of the elbow. He said no improvement with over-the-counter medication.  Outpatient Prescriptions Prior to Visit  Medication Sig Dispense Refill  . aspirin 81 MG tablet Take 81 mg by mouth daily.    . valsartan (DIOVAN) 320 MG tablet Take 320 mg by mouth daily.    . diphenhydrAMINE (BENADRYL) 25 MG tablet Take 25 mg by mouth every 6 (six) hours as needed.    . doxycycline (VIBRA-TABS) 100 MG tablet Take 1 tablet (100 mg total) by mouth 2 (two) times daily. (Patient not taking: Reported on 10/20/2014) 8 tablet 0  . fluocinonide-emollient (LIDEX-E) 0.05 % cream Apply topically 2 (two) times daily. (Patient not taking: Reported on 10/20/2014) 30 g 0  . HYDROmorphone (DILAUDID) 2 MG tablet Take 1 tablet (2 mg total) by mouth every 4 (four) hours as needed. (Patient not taking: Reported on 10/20/2014) 30 tablet 0   No facility-administered medications prior to visit.    History   Social History  . Marital Status: Married    Spouse Name: N/A  . Number of Children: N/A  . Years of Education: N/A   Social History Main Topics  . Smoking status: Current Every Day Smoker -- 1.00 packs/day    Types: Cigarettes  . Smokeless tobacco: Not on file  . Alcohol Use: Yes     Comment: occ  . Drug Use: No  . Sexual Activity: Not on file   Other Topics Concern  . None   Social History Narrative    History reviewed. No pertinent family  history.  Past Medical History  Diagnosis Date  . Hypertension   . Seasonal allergies   . Allergy      Review of Systems  Constitutional: Negative for fever, chills and appetite change.  HENT: Negative for congestion, ear pain, postnasal drip, sinus pressure and sore throat.   Eyes: Negative for pain and redness.  Respiratory: Negative for cough, shortness of breath and wheezing.   Cardiovascular: Negative for leg swelling.  Gastrointestinal: Negative for nausea, vomiting, abdominal pain, diarrhea, constipation and blood in stool.  Endocrine: Negative for polyuria.  Genitourinary: Negative for dysuria, urgency, frequency and flank pain.  Musculoskeletal: Positive for joint swelling. Negative for gait problem.  Skin: Negative for rash.  Neurological: Negative for weakness and headaches.  Psychiatric/Behavioral: Negative for confusion and decreased concentration. The patient is not nervous/anxious.     Objective:  BP 126/78 mmHg  Pulse 100  Temp(Src) 97.8 F (36.6 C) (Oral)  Resp 17  Ht 5\' 8"  (1.727 m)  Wt 155 lb (70.308 kg)  BMI 23.57 kg/m2  SpO2 98%  BP Readings from Last 3 Encounters:  10/20/14 126/78  02/15/14 136/80  11/10/13 125/83    Wt Readings from Last 3 Encounters:  10/20/14 155 lb (70.308 kg)  02/15/14 159 lb 9.6 oz (72.394 kg)  11/10/13 155 lb (  70.308 kg)    Physical Exam  Constitutional: He is oriented to person, place, and time. He appears well-developed and well-nourished.  HENT:  Head: Normocephalic and atraumatic.  Eyes: Conjunctivae are normal. Pupils are equal, round, and reactive to light.  Pulmonary/Chest: Effort normal.  Musculoskeletal: He exhibits no edema.       Left elbow: He exhibits decreased range of motion and effusion. He exhibits no swelling and no deformity. Tenderness found.  Neurological: He is alert and oriented to person, place, and time.  Skin: Skin is dry.  Psychiatric: He has a normal mood and affect. His behavior is  normal. Thought content normal.    Lab Results  Component Value Date   WBC 4.2* 10/20/2014   HGB 15.2 10/20/2014   HCT 45.2 10/20/2014   GLUCOSE 110* 11/07/2013   NA 140 11/07/2013   K 4.7 11/07/2013   CL 102 11/07/2013   CREATININE 0.80 11/07/2013   BUN 12 11/07/2013   CO2 26 11/07/2013      .  Assessment & Plan:   [name] was seen today for insect bite and elbow injury.  Diagnoses and all orders for this visit:  Elbow pain, right Orders: -     POCT CBC -     DG Elbow Complete Right; Future  Other orders -     naproxen sodium (ANAPROX DS) 550 MG tablet; Take 1 tablet (550 mg total) by mouth 2 (two) times daily with a meal. -     HYDROcodone-acetaminophen (NORCO) 5-325 MG per tablet; Take 1-2 tablets by mouth every 4 (four) hours as needed.   I am having [name] start on naproxen sodium and HYDROcodone-acetaminophen. I am also having him maintain his valsartan, aspirin, fluocinonide-emollient, diphenhydrAMINE, HYDROmorphone, and doxycycline.  Meds ordered this encounter  Medications  . naproxen sodium (ANAPROX DS) 550 MG tablet    Sig: Take 1 tablet (550 mg total) by mouth 2 (two) times daily with a meal.    Dispense:  40 tablet    Refill:  0  . HYDROcodone-acetaminophen (NORCO) 5-325 MG per tablet    Sig: Take 1-2 tablets by mouth every 4 (four) hours as needed.    Dispense:  30 tablet    Refill:  0   Is unclear at this time whether he has cellulitis of the elbow or simply his musculoskeletal injury. He has limitation of motion joint with an absence of renal cellulitis or fever seems to imply musculoskeletal injury. He was put in a sling and put on medication with instruction to apply ice and follow-up in a week. He is to limit his use that arm.   Appropriate red flag conditions were discussed with the patient as well as actions that should be taken.  Patient expressed his understanding.  Follow-up: Return in about 1 week (around 10/27/2014), or if symptoms worsen  or fail to improve.  Roselee Culver, MD   Results for orders placed or performed in visit on 10/20/14  POCT CBC  Result Value Ref Range   WBC 4.2 (A) 4.6 - 10.2 K/uL   Lymph, poc 1.7 0.6 - 3.4   POC LYMPH PERCENT 41.0 10 - 50 %L   MID (cbc) 0.2 0 - 0.9   POC MID % 5.4 0 - 12 %M   POC Granulocyte 2.3 2 - 6.9   Granulocyte percent 53.6 37 - 80 %G   RBC 4.68 (A) 4.69 - 6.13 M/uL   Hemoglobin 15.2 14.1 - 18.1 g/dL   HCT,  POC 45.2 43.5 - 53.7 %   MCV 96.6 80 - 97 fL   MCH, POC 32.5 (A) 27 - 31.2 pg   MCHC 33.6 31.8 - 35.4 g/dL   RDW, POC 12.2 %   Platelet Count, POC 245 142 - 424 K/uL   MPV 6.2 0 - 99.8 fL     [facility] reading (PRIMARY) by  Dr. Ouida Sills.  negative.

## 2014-10-20 NOTE — Patient Instructions (Signed)
Elbow Contusion An elbow contusion is a deep bruise of the elbow. Contusions are the result of an injury that caused bleeding under the skin. The contusion may turn blue, purple, or yellow. Minor injuries will give you a painless contusion, but more severe contusions may stay painful and swollen for a few weeks.  CAUSES  An elbow contusion comes from a direct force to that area, such as falling on the elbow. SYMPTOMS   Swelling and redness of the elbow.  Bruising of the elbow area.  Tenderness or soreness of the elbow. DIAGNOSIS  You will have a physical exam and will be asked about your history. You may need an X-ray of your elbow to look for a broken bone (fracture).  TREATMENT  A sling or splint may be needed to support your injury. Resting, elevating, and applying cold compresses to the elbow area are often the best treatments for an elbow contusion. Over-the-counter medicines may also be recommended for pain control. HOME CARE INSTRUCTIONS   Put ice on the injured area.  Put ice in a plastic bag.  Place a towel between your skin and the bag.  Leave the ice on for 15-20 minutes, 03-04 times a day.  Only take over-the-counter or prescription medicines for pain, discomfort, or fever as directed by your caregiver.  Rest your injured elbow until the pain and swelling are better.  Elevate your elbow to reduce swelling.  Apply a compression wrap as directed by your caregiver. This can help reduce swelling and motion. You may remove the wrap for sleeping, showers, and baths. If your fingers become numb, cold, or blue, take the wrap off and reapply it more loosely.  Use your elbow only as directed by your caregiver. You may be asked to do range of motion exercises. Do them as directed.  See your caregiver as directed. It is very important to keep all follow-up appointments in order to avoid any long-term problems with your elbow, including chronic pain or inability to move your elbow  normally. SEEK IMMEDIATE MEDICAL CARE IF:   You have increased redness, swelling, or pain in your elbow.  Your swelling or pain is not relieved with medicines.  You have swelling of the hand and fingers.  You are unable to move your fingers or wrist.  You begin to lose feeling in your hand or fingers.  Your fingers or hand become cold or blue. MAKE SURE YOU:   Understand these instructions.  Will watch your condition.  Will get help right away if you are not doing well or get worse. Document Released: 04/13/2006 Document Revised: 07/28/2011 Document Reviewed: 03/21/2011 [facility] Patient Information 2015 [place], [place]. This information is not intended to replace advice given to you by your health care provider. Make sure you discuss any questions you have with your health care provider.

## 2015-02-15 ENCOUNTER — Ambulatory Visit (INDEPENDENT_AMBULATORY_CARE_PROVIDER_SITE_OTHER): Payer: PRIVATE HEALTH INSURANCE | Admitting: Family Medicine

## 2015-02-15 VITALS — BP 132/80 | HR 98 | Temp 98.2°F | Resp 17 | Ht 68.0 in | Wt 153.0 lb

## 2015-02-15 DIAGNOSIS — H1131 Conjunctival hemorrhage, right eye: Secondary | ICD-10-CM

## 2015-02-15 DIAGNOSIS — S0501XA Injury of conjunctiva and corneal abrasion without foreign body, right eye, initial encounter: Secondary | ICD-10-CM

## 2015-02-15 MED ORDER — TOBRAMYCIN 0.3 % OP SOLN: 1.0000 [drp] | OPHTHALMIC | Status: DC

## 2015-02-15 MED ORDER — HYDROCODONE-ACETAMINOPHEN 5-325 MG PO TABS: 1.0000 | ORAL_TABLET | Freq: Four times a day (QID) | ORAL | Status: DC | PRN

## 2015-02-15 NOTE — Progress Notes (Signed)
° °  Subjective:    Patient ID: KAYLIB FURNESS, male    DOB: 1961-03-02, 54 y.o.   MRN: [id number] This chart was scribed for Robyn Haber, MD by Zola Button, Medical Scribe. This patient was seen in Room 9 and the patient's care was started at 8:32 AM.   HPI HPI Comments: SHO SALGUERO is a 54 y.o. male who presents [facility] complaining of sudden onset right eye pain secondary to an eye injury that occurred about 30 minutes ago while at work. Patient notes that he was struck in the eye with a pipe after he opened the trunk of his vehicle and did notice some bleeding. He does note having a foreign body sensation in his eye. He reports having associated headache. Patient denies any other injuries.  Patient is a self-employed Development worker, community. He is originally from the [place], [place].  Review of Systems  Eyes: Positive for pain.  Neurological: Positive for headaches.       Objective:   Physical Exam CONSTITUTIONAL: Well developed/well nourished HEAD: Normocephalic EYES: Is mildly ecchymotic, there is bilateral subconjunctival hemorrhage (each side of right iris), fundus is normal, floor seen stain shows U-shaped superficial abrasion on the lateral aspect of the cornea. ENMT: Mucous membranes moist NECK: supple no meningeal signs SPINE: entire spine nontender CV: S1/S2 noted, no murmurs/rubs/gallops noted LUNGS: Lungs are clear to auscultation bilaterally, no apparent distress ABDOMEN: soft, nontender, no rebound or guarding GU: no cva tenderness NEURO: Pt is awake/alert, moves all extremitiesx4 EXTREMITIES: pulses normal, full ROM SKIN: warm, color normal PSYCH: no abnormalities of mood noted        Assessment & Plan:   By signing my name below, I, Zola Button, attest that this documentation has been prepared under the direction and in the presence of Robyn Haber, MD.  Electronically Signed: Zola Button, Medical Scribe. 02/15/2015. 8:32 AM. This chart was  scribed in my presence and reviewed by me personally.    ICD-9-CM ICD-10-CM   1. Corneal abrasion, right, initial encounter 918.1 S05.01XA tobramycin (TOBREX) 0.3 % ophthalmic solution     HYDROcodone-acetaminophen (NORCO) 5-325 MG tablet  2. Subconjunctival hemorrhage of right eye 372.72 H11.31 HYDROcodone-acetaminophen (NORCO) 5-325 MG tablet     Signed, Robyn Haber, MD

## 2015-02-15 NOTE — Patient Instructions (Signed)
Corneal Abrasion °The cornea is the clear covering at the front and center of the eye. When looking at the colored portion of the eye (iris), you are looking through the cornea. This very thin tissue is made up of many layers. The surface layer is a single layer of cells (corneal epithelium) and is one of the most sensitive tissues in the body. If a scratch or injury causes the corneal epithelium to come off, it is called a corneal abrasion. If the injury extends to the tissues below the epithelium, the condition is called a corneal ulcer. °CAUSES  °· Scratches. °· Trauma. °· Foreign body in the eye. °Some people have recurrences of abrasions in the area of the original injury even after it has healed (recurrent erosion syndrome). Recurrent erosion syndrome generally improves and goes away with time. °SYMPTOMS  °· Eye pain. °· Difficulty or inability to keep the injured eye open. °· The eye becomes very sensitive to light. °· Recurrent erosions tend to happen suddenly, first thing in the morning, usually after waking up and opening the eye. °DIAGNOSIS  °Your health care provider can diagnose a corneal abrasion during an eye exam. Dye is usually placed in the eye using a drop or a small paper strip moistened by your tears. When the eye is examined with a special light, the abrasion shows up clearly because of the dye. °TREATMENT  °· Small abrasions may be treated with antibiotic drops or ointment alone. °· A pressure patch may be put over the eye. If this is done, follow your doctor's instructions for when to remove the patch. Do not drive or use machines while the eye patch is on. Judging distances is hard to do with a patch on. °If the abrasion becomes infected and spreads to the deeper tissues of the cornea, a corneal ulcer can result. This is serious because it can cause corneal scarring. Corneal scars interfere with light passing through the cornea and cause a loss of vision in the involved eye. °HOME CARE  INSTRUCTIONS °· Use medicine or ointment as directed. Only take over-the-counter or prescription medicines for pain, discomfort, or fever as directed by your health care provider. °· Do not drive or operate machinery if your eye is patched. Your ability to judge distances is impaired. °· If your health care provider has given you a follow-up appointment, it is very important to keep that appointment. Not keeping the appointment could result in a severe eye infection or permanent loss of vision. If there is any problem keeping the appointment, let your health care provider know. °SEEK MEDICAL CARE IF:  °· You have pain, light sensitivity, and a scratchy feeling in one eye or both eyes. °· Your pressure patch keeps loosening up, and you can blink your eye under the patch after treatment. °· Any kind of discharge develops from the eye after treatment or if the lids stick together in the morning. °· You have the same symptoms in the morning as you did with the original abrasion days, weeks, or months after the abrasion healed. °MAKE SURE YOU:  °· Understand these instructions. °· Will watch your condition. °· Will get help right away if you are not doing well or get worse. °Document Released: 05/02/2000 Document Revised: 05/10/2013 Document Reviewed: 01/10/2013 °ExitCare® Patient Information ©2015 [employer]. This information is not intended to replace advice given to you by your health care provider. Make sure you discuss any questions you have with your health care provider. °Subconjunctival Hemorrhage °A   subconjunctival hemorrhage is a bright red patch covering a portion of the white of the eye. The white part of the eye is called the sclera, and it is covered by a thin membrane called the conjunctiva. This membrane is clear, except for tiny blood vessels that you can see with the naked eye. When your eye is irritated or inflamed and becomes red, it is because the vessels in the conjunctiva are  swollen. °Sometimes, a blood vessel in the conjunctiva can break and bleed. When this occurs, the blood builds up between the conjunctiva and the sclera, and spreads out to create a red area. The red spot may be very small at first. It may then spread to cover a larger part of the surface of the eye, or even all of the visible white part of the eye. °In almost all cases, the blood will go away and the eye will become white again. Before completely dissolving, however, the red area may spread. It may also become brownish-yellow in color before going away. If a lot of blood collects under the conjunctiva, it may look like a bulge on the surface of the eye. This looks scary, but it will also eventually flatten out and go away. Subconjunctival hemorrhages do not cause pain, but if swollen, may cause a feeling of irritation. There is no effect on vision.  °CAUSES  °· The most common cause is mild trauma (rubbing the eye, irritation). °· Subconjunctival hemorrhages can happen because of coughing or straining (lifting heavy objects), vomiting, or sneezing. °· In some cases, your doctor may want to check your blood pressure. High blood pressure can also cause a subconjunctival hemorrhage. °· Severe trauma or blunt injuries. °· Diseases that affect blood clotting (hemophilia, leukemia). °· Abnormalities of blood vessels behind the eye (carotid cavernous sinus fistula). °· Tumors behind the eye. °· Certain drugs (aspirin, Coumadin, heparin). °· Recent eye surgery. °HOME CARE INSTRUCTIONS  °· Do not worry about the appearance of your eye. You may continue your usual activities. °· Often, follow-up is not necessary. °SEEK MEDICAL CARE IF:  °· Your eye becomes painful. °· The bleeding does not disappear within 3 weeks. °· Bleeding occurs elsewhere, for example, under the skin, in the mouth, or in the other eye. °· You have recurring subconjunctival hemorrhages. °SEEK IMMEDIATE MEDICAL CARE IF:  °· Your vision changes or you have  difficulty seeing. °· You develop a severe headache, persistent vomiting, confusion, or abnormal drowsiness (lethargy). °· Your eye seems to bulge or protrude from the eye socket. °· You notice the sudden appearance of bruises or have spontaneous bleeding elsewhere on your body. °Document Released: 05/05/2005 Document Revised: 09/19/2013 Document Reviewed: 04/02/2009 °ExitCare® Patient Information ©2015 [employer]. This information is not intended to replace advice given to you by your health care provider. Make sure you discuss any questions you have with your health care provider. ° °

## 2015-04-01 ENCOUNTER — Ambulatory Visit (INDEPENDENT_AMBULATORY_CARE_PROVIDER_SITE_OTHER): Payer: 59 | Admitting: Physician Assistant

## 2015-04-01 VITALS — BP 118/84 | HR 102 | Temp 97.6°F | Resp 20 | Ht 67.0 in | Wt 153.4 lb

## 2015-04-01 DIAGNOSIS — G43811 Other migraine, intractable, with status migrainosus: Secondary | ICD-10-CM | POA: Diagnosis not present

## 2015-04-01 LAB — POCT CBC
Granulocyte percent: 84.8 %G — AB (ref 37–80)
HCT, POC: 42.9 % — AB (ref 43.5–53.7)
Hemoglobin: 15.3 g/dL (ref 14.1–18.1)
Lymph, poc: 1.1 (ref 0.6–3.4)
MCH, POC: 34.2 pg — AB (ref 27–31.2)
MCHC: 35.6 g/dL — AB (ref 31.8–35.4)
MCV: 96.2 fL (ref 80–97)
MID (cbc): 0.4 (ref 0–0.9)
MPV: 6.3 fL (ref 0–99.8)
POC Granulocyte: 8.1 — AB (ref 2–6.9)
POC LYMPH PERCENT: 11.3 %L (ref 10–50)
POC MID %: 3.9 %M (ref 0–12)
Platelet Count, POC: 201 10*3/uL (ref 142–424)
RBC: 4.46 M/uL — AB (ref 4.69–6.13)
RDW, POC: 12.3 %
WBC: 9.6 10*3/uL (ref 4.6–10.2)

## 2015-04-01 LAB — POCT SEDIMENTATION RATE: POCT SED RATE: 27 mm/hr — AB (ref 0–22)

## 2015-04-01 MED ORDER — KETOROLAC TROMETHAMINE 60 MG/2ML IM SOLN
60.0000 mg | Freq: Once | INTRAMUSCULAR | Status: AC
  Administered 2015-04-01: 60 mg via INTRAMUSCULAR

## 2015-04-01 NOTE — Patient Instructions (Signed)

## 2015-04-01 NOTE — Progress Notes (Addendum)
[facility] [address] [place] [address] [phone]  Date:  04/01/2015   Name:  Philip Winters   DOB:  1960-12-22   MRN:  [id number]  PCP:  Gara Kroner, MD   EPIC DOWN DURING VISIT  History of Present Illness:  Philip Winters is a 54 y.o. male patient who presents to [facility] for 5 days of migraine.  Pain is constant.  5 days ago, he was at an unkempt home with cats, in the basement.  Where he had significant head pain along his forehead.  He left the home and did not feel any better.  Head pain is 8/10.  Nausea with one episode of emesis.  It is a throbbing headache that has now migrated to the right side of the forehead and behind his eye.  Some dizziness with standing.  Loss of appetite due to the pain.  Photophobia, and phonophobia.  He has no vision changes.  No auras.  He has had 3 episodes of this head pain in the last year.  He had a hx of migraines when he was in his early 11s which resolved.  No recent upper respiratory infection.  It would be precipitated by eating a hot dog, or having other foods, that he is unsure of.  He was seen at another facility 3 days ago, and treated with an injection, and  He is a smoker for years. Occasional etOH.     Patient Active Problem List   Diagnosis Date Noted  . HYPERLIPIDEMIA 01/18/2007  . HYPERTENSION 01/18/2007    Past Medical History  Diagnosis Date  . Hypertension   . Seasonal allergies   . Allergy     Past Surgical History  Procedure Laterality Date  . Knee arthroscopy  [id number]    left and right  . Colonoscopy    . Carpal tunnel with cubital tunnel Left 11/10/2013    Procedure: LEFT CARPAL TUNNEL RELEASE;  Surgeon: Cammie Sickle, MD;  Location: [place];  Service: Orthopedics;  Laterality: Left;  . Trigger finger release Left 11/10/2013    Procedure: LEFT INDEX A-1 PULLEY RELEASE;  Surgeon: Cammie Sickle, MD;  Location: [place];  Service: Orthopedics;   Laterality: Left;  . Ulnar nerve transposition Left 11/10/2013    Procedure: ULNAR NERVE TRANSPOSITION;  Surgeon: Cammie Sickle, MD;  Location: [place];  Service: Orthopedics;  Laterality: Left;    Social History  Substance Use Topics  . Smoking status: Current Every Day Smoker -- 1.00 packs/day for 37 years    Types: Cigarettes  . Smokeless tobacco: None  . Alcohol Use: 0.0 oz/week    0 Standard drinks or equivalent per week     Comment: occ    History reviewed. No pertinent family history.  Allergies  Allergen Reactions  . Penicillins Shortness Of Breath  . Codeine Nausea And Vomiting  . Iodine Rash    Medication list has been reviewed and updated.  Current Outpatient Prescriptions on File Prior to Visit  Medication Sig Dispense Refill  . aspirin 81 MG tablet Take 81 mg by mouth daily.    . diphenhydrAMINE (BENADRYL) 25 MG tablet Take 25 mg by mouth every 6 (six) hours as needed.    Marland Kitchen HYDROcodone-acetaminophen (NORCO) 5-325 MG tablet Take 1 tablet by mouth every 6 (six) hours as needed for moderate pain. 12 tablet 0  . naproxen sodium (ANAPROX DS) 550 MG  tablet Take 1 tablet (550 mg total) by mouth 2 (two) times daily with a meal. 40 tablet 0  . valsartan (DIOVAN) 320 MG tablet Take 320 mg by mouth daily.    . predniSONE (DELTASONE) 20 MG tablet Take 20 mg by mouth daily with breakfast.     No current facility-administered medications on file prior to visit.    ROS ROS otherwise unremarkable unless listed above.  Physical Examination: BP 118/84 mmHg  Pulse 102  Temp(Src) 97.6 F (36.4 C) (Oral)  Resp 20  Ht 5\' 7"  (1.702 m)  Wt 153 lb 6.4 oz (69.582 kg)  BMI 24.02 kg/m2  SpO2 97% Ideal Body Weight: Weight in (lb) to have BMI = 25: 159.3  Physical Exam  Constitutional: He is oriented to person, place, and time. He appears well-developed and well-nourished. No distress.  HENT:  Head: Normocephalic and atraumatic.  Nose: No mucosal edema or  rhinorrhea. Right sinus exhibits frontal sinus tenderness. Right sinus exhibits no maxillary sinus tenderness. Left sinus exhibits no maxillary sinus tenderness and no frontal sinus tenderness.  Mouth/Throat: No uvula swelling. No oropharyngeal exudate, posterior oropharyngeal edema or posterior oropharyngeal erythema.  Eyes: Conjunctivae and EOM are normal. Pupils are equal, round, and reactive to light. Right conjunctiva is not injected. Left conjunctiva is not injected. Right eye exhibits normal extraocular motion. Left eye exhibits normal extraocular motion.  Cardiovascular: Normal rate.   Pulmonary/Chest: Effort normal. No respiratory distress.  Lymphadenopathy:    He has no cervical adenopathy.  Neurological: He is alert and oriented to person, place, and time. He displays no atrophy. No cranial nerve deficit or sensory deficit. He exhibits normal muscle tone. Coordination normal.  Difficulty with tandem gait.  Normal oculomotor movement.  Appropriate rapid movement of extremities.      Skin: Skin is warm and dry. He is not diaphoretic.  Psychiatric: He has a normal mood and affect. His behavior is normal.     Assessment and Plan: Philip Winters is a 54 y.o. male who is here today for headache for 5 days.   -toradol has been helpful at this time.  Also given IV fluids.  He reports pain 5/10, and tolerable.  I am giving toradol oral medication at this time, along with doxycycline for possible sinus infection.  I have advised that he return if his symptoms do not resolve in 3 days.  We will then decide on imaging at that time.  This can be a migraine brought on by contact with offensive chemicals (nitrates, ammonia, etc).  He will continue the flonase.  Advised to discontinue the afrin at this time.     Other migraine with status migrainosus, intractable - Plan: POCT CBC, POCT SEDIMENTATION RATE, ketorolac (TORADOL) injection 60 mg -Toradol -Doxycycline  Ivar Drape, PA-C [facility] 04/01/2015 9:44 AM

## 2015-04-03 ENCOUNTER — Ambulatory Visit (INDEPENDENT_AMBULATORY_CARE_PROVIDER_SITE_OTHER): Payer: 59

## 2015-04-03 ENCOUNTER — Ambulatory Visit (INDEPENDENT_AMBULATORY_CARE_PROVIDER_SITE_OTHER): Payer: 59 | Admitting: Family Medicine

## 2015-04-03 VITALS — BP 124/82 | HR 99 | Temp 97.9°F | Resp 17 | Ht 67.0 in | Wt 157.0 lb

## 2015-04-03 DIAGNOSIS — J019 Acute sinusitis, unspecified: Secondary | ICD-10-CM | POA: Diagnosis not present

## 2015-04-03 DIAGNOSIS — Z7952 Long term (current) use of systemic steroids: Secondary | ICD-10-CM | POA: Diagnosis not present

## 2015-04-03 DIAGNOSIS — R519 Headache, unspecified: Secondary | ICD-10-CM

## 2015-04-03 DIAGNOSIS — R51 Headache: Secondary | ICD-10-CM

## 2015-04-03 LAB — POCT CBC
Granulocyte percent: 76.3 %G (ref 37–80)
HCT, POC: 42.5 % — AB (ref 43.5–53.7)
Hemoglobin: 14.7 g/dL (ref 14.1–18.1)
Lymph, poc: 1.6 (ref 0.6–3.4)
MCH, POC: 33.6 pg — AB (ref 27–31.2)
MCHC: 34.7 g/dL (ref 31.8–35.4)
MCV: 96.9 fL (ref 80–97)
MID (cbc): 0.4 (ref 0–0.9)
MPV: 6 fL (ref 0–99.8)
POC Granulocyte: 6.7 (ref 2–6.9)
POC LYMPH PERCENT: 18.6 %L (ref 10–50)
POC MID %: 5.1 %M (ref 0–12)
Platelet Count, POC: 213 10*3/uL (ref 142–424)
RBC: 4.39 M/uL — AB (ref 4.69–6.13)
RDW, POC: 12 %
WBC: 8.8 10*3/uL (ref 4.6–10.2)

## 2015-04-03 LAB — POCT SEDIMENTATION RATE: POCT SED RATE: 23 mm/hr — AB (ref 0–22)

## 2015-04-03 LAB — GLUCOSE, POCT (MANUAL RESULT ENTRY): POC Glucose: 82 mg/dl (ref 70–99)

## 2015-04-03 MED ORDER — HYDROCODONE-ACETAMINOPHEN 5-325 MG PO TABS: 1.0000 | ORAL_TABLET | Freq: Four times a day (QID) | ORAL | Status: DC | PRN

## 2015-04-03 MED ORDER — PREDNISONE 20 MG PO TABS: ORAL_TABLET | ORAL | Status: DC

## 2015-04-03 NOTE — Patient Instructions (Signed)
Take prednisone as directed. Take in AM so it doesn't cause insomnia. Take hydrocodone as needed for pain. Continue doxy. Continue flonase and mucinex. Drink plenty of water -  At least 64 oz water. If your symptoms are not improving in 48 hours, call and let me know. We may need to switch the antibiotic if that's the case.

## 2015-04-03 NOTE — Progress Notes (Signed)
[facility] [address], [place] [address] [phone]  Date:  04/03/2015   Name:  Philip Winters   DOB:  1961-02-03   MRN:  [id number]  PCP:  Gara Kroner, MD    Chief Complaint: Migraine   History of Present Illness:  This is a 54 y.o. male with PMH HLD, HTN who is presenting with right sided headache located over right frontal and extends downwards toward right jaw. 7 days ago he was visiting a client's house who had a lot of cats. He immediately felt congested. The next day he started with a bilateral frontal headache. His headache gradually worsened. 4 days ago the headache became right frontal instead of bilateral. He was seen at another practice and told to use flonase and afrin. He was also given an injection (does not know what). The following day, his headache increased in intensity to 10/10 pain. He was seen here by Ivar Drape. Neuro exam normal. He was given toradol injection and 1 L NS in office and given rx ketorolac and doxy. He states yesterday pain had improved to 4-5/10 pain but then this morning woke with again 10/10 pain. He is endorsing right sided teeth pain. He is mildly nauseated, no vomiting. He is sensitive to light. He denies fever, chills, sore throat, cough, otalgia, weakness or numbness. He has taken 4 doses doxy so far. He feels ketorolac is not helping. He has some left norco at home - took one today and helped. Had migraines in his 57s, but "grew out of them".    Review of Systems:  Review of Systems See HPI  Patient Active Problem List   Diagnosis Date Noted  . HYPERLIPIDEMIA 01/18/2007  . HYPERTENSION 01/18/2007    Prior to Admission medications   Medication Sig Start Date End Date Taking? Authorizing Provider  aspirin 81 MG tablet Take 81 mg by mouth daily.   Yes Historical Provider, MD  diphenhydrAMINE (BENADRYL) 25 MG tablet Take 25 mg by mouth every 6 (six) hours as needed.   Yes Historical Provider, MD   HYDROcodone-acetaminophen (NORCO) 5-325 MG tablet Take 1 tablet by mouth every 6 (six) hours as needed for moderate pain. 02/15/15  Yes Robyn Haber, MD  valsartan (DIOVAN) 320 MG tablet Take 320 mg by mouth daily.   Yes Historical Provider, MD  naproxen sodium (ANAPROX DS) 550 MG tablet Take 1 tablet (550 mg total) by mouth 2 (two) times daily with a meal. Patient not taking: Reported on 04/03/2015 10/20/14 10/20/15  Roselee Culver, MD  predniSONE (DELTASONE) 20 MG tablet Take 20 mg by mouth daily with breakfast.    Historical Provider, MD    Allergies  Allergen Reactions  . Penicillins Shortness Of Breath  . Codeine Nausea And Vomiting  . Iodine Rash    Past Surgical History  Procedure Laterality Date  . Knee arthroscopy  [id number]    left and right  . Colonoscopy    . Carpal tunnel with cubital tunnel Left 11/10/2013    Procedure: LEFT CARPAL TUNNEL RELEASE;  Surgeon: Cammie Sickle, MD;  Location: [place];  Service: Orthopedics;  Laterality: Left;  . Trigger finger release Left 11/10/2013    Procedure: LEFT INDEX A-1 PULLEY RELEASE;  Surgeon: Cammie Sickle, MD;  Location: [place];  Service: Orthopedics;  Laterality: Left;  . Ulnar nerve transposition Left 11/10/2013    Procedure: ULNAR NERVE TRANSPOSITION;  Surgeon: Cammie Sickle,  MD;  Location: [place];  Service: Orthopedics;  Laterality: Left;    Social History  Substance Use Topics  . Smoking status: Current Every Day Smoker -- 1.00 packs/day for 37 years    Types: Cigarettes  . Smokeless tobacco: None  . Alcohol Use: 0.0 oz/week    0 Standard drinks or equivalent per week     Comment: occ    History reviewed. No pertinent family history.  Medication list has been reviewed and updated.  Physical Examination:  Physical Exam  Constitutional: He is oriented to person, place, and time. He appears well-developed and well-nourished. No distress.  HENT:   Head: Normocephalic and atraumatic.  Right Ear: Hearing, tympanic membrane, external ear and ear canal normal.  Left Ear: Hearing, tympanic membrane, external ear and ear canal normal.  Nose: Rhinorrhea (right nostril) present. Right sinus exhibits maxillary sinus tenderness and frontal sinus tenderness.  Mouth/Throat: Uvula is midline, oropharynx is clear and moist and mucous membranes are normal.  Tenderness over right temple No tenderness at dental roots No rash or lesions on face/scalp  Eyes: Conjunctivae, EOM and lids are normal. Pupils are equal, round, and reactive to light. Right eye exhibits no discharge. Left eye exhibits no discharge. No scleral icterus.  No significant photophobia with shining light in eye  Neck: Trachea normal.  Cardiovascular: Normal rate, regular rhythm, normal heart sounds and normal pulses.   No murmur heard. Pulmonary/Chest: Effort normal and breath sounds normal. No respiratory distress. He has no wheezes. He has no rhonchi. He has no rales.  Musculoskeletal: Normal range of motion.  Lymphadenopathy:       Head (right side): No submental, no submandibular and no tonsillar adenopathy present.       Head (left side): No submental, no submandibular and no tonsillar adenopathy present.    He has no cervical adenopathy.  Neurological: He is alert and oriented to person, place, and time. He has normal strength and normal reflexes. No cranial nerve deficit or sensory deficit. Gait normal.  Subjective increased sensitivity on right side of face  Skin: Skin is warm, dry and intact. No lesion and no rash noted.  Psychiatric: He has a normal mood and affect. His speech is normal and behavior is normal. Thought content normal.   BP 124/82 mmHg  Pulse 99  Temp(Src) 97.9 F (36.6 C) (Oral)  Resp 17  Ht 5\' 7"  (1.702 m)  Wt 157 lb (71.215 kg)  BMI 24.58 kg/m2  SpO2 98%  Radiograph Sinuses IMPRESSION: 1. I am suspicious for chronic maxillary sinusitis, right  greater than left.  Results for orders placed or performed in visit on 04/03/15  POCT CBC  Result Value Ref Range   WBC 8.8 4.6 - 10.2 K/uL   Lymph, poc 1.6 0.6 - 3.4   POC LYMPH PERCENT 18.6 10 - 50 %L   MID (cbc) 0.4 0 - 0.9   POC MID % 5.1 0 - 12 %M   POC Granulocyte 6.7 2 - 6.9   Granulocyte percent 76.3 37 - 80 %G   RBC 4.39 (A) 4.69 - 6.13 M/uL   Hemoglobin 14.7 14.1 - 18.1 g/dL   HCT, POC 42.5 (A) 43.5 - 53.7 %   MCV 96.9 80 - 97 fL   MCH, POC 33.6 (A) 27 - 31.2 pg   MCHC 34.7 31.8 - 35.4 g/dL   RDW, POC 12.0 %   Platelet Count, POC 213 142 - 424 K/uL   MPV 6.0 0 -  99.8 fL  POCT SEDIMENTATION RATE  Result Value Ref Range   POCT SED RATE 23 (A) 0 - 22 mm/hr  POCT glucose (manual entry)  Result Value Ref Range   POC Glucose 82 70 - 99 mg/dl    Assessment and Plan:  1. Acute nonintractable headache, unspecified headache type 2. Acute sinusitis Sinus film showed right maxillary sinusitis, likely chronic, R>L. Headache seems sinus related. Neuro exam normal. Doxy is a good option for treatment since pen allergic. Will add prednisone taper. Refilled hydrocodone since helped his HA today. He will call if symptoms not improving in 48 hours - would switch abx at that time, possibly to levaquin. He will continue muxinex and flonase. - DG Sinuses Complete; Future - POCT CBC - POCT SEDIMENTATION RATE - predniSONE (DELTASONE) 20 MG tablet; Take 3 PO QAM x3days, 2 PO QAM x3days, 1 PO QAM x3days  Dispense: 18 tablet; Refill: 0 - HYDROcodone-acetaminophen (NORCO) 5-325 MG tablet; Take 1 tablet by mouth every 6 (six) hours as needed for moderate pain.  Dispense: 12 tablet; Refill: 0 - POCT glucose (manual entry)   Benjaman Pott. Drenda Freeze, MHS [facility]  04/03/2015

## 2015-04-05 ENCOUNTER — Telehealth: Payer: Self-pay

## 2015-04-05 ENCOUNTER — Other Ambulatory Visit: Payer: Self-pay | Admitting: Physician Assistant

## 2015-04-05 DIAGNOSIS — J0100 Acute maxillary sinusitis, unspecified: Secondary | ICD-10-CM

## 2015-04-05 MED ORDER — LEVOFLOXACIN 500 MG PO TABS: 500.0000 mg | ORAL_TABLET | Freq: Every day | ORAL | Status: DC

## 2015-04-05 NOTE — Telephone Encounter (Signed)
Husband is not any better.  He was here two times in the past week.  What does he do?     [name]  [phone] (H)

## 2015-04-05 NOTE — Telephone Encounter (Signed)
Per Nicole's last note will move him to Levaquin. Please let them know.  I will send to pharmacy via epic.   Philis Fendt, MS, PA-C 5:13 PM, 04/05/2015

## 2015-04-06 ENCOUNTER — Encounter (HOSPITAL_COMMUNITY): Payer: Self-pay

## 2015-04-06 ENCOUNTER — Emergency Department (HOSPITAL_COMMUNITY): Payer: 59

## 2015-04-06 ENCOUNTER — Emergency Department (HOSPITAL_COMMUNITY)
Admission: EM | Admit: 2015-04-06 | Discharge: 2015-04-06 | Disposition: A | Discharge status: Home / Self Care | Payer: 59 | Attending: Emergency Medicine | Admitting: Emergency Medicine

## 2015-04-06 DIAGNOSIS — Z880 Allergy status to penicillin: Secondary | ICD-10-CM | POA: Diagnosis not present

## 2015-04-06 DIAGNOSIS — Z792 Long term (current) use of antibiotics: Secondary | ICD-10-CM | POA: Diagnosis not present

## 2015-04-06 DIAGNOSIS — Z79899 Other long term (current) drug therapy: Secondary | ICD-10-CM | POA: Insufficient documentation

## 2015-04-06 DIAGNOSIS — F17210 Nicotine dependence, cigarettes, uncomplicated: Secondary | ICD-10-CM | POA: Insufficient documentation

## 2015-04-06 DIAGNOSIS — Z7982 Long term (current) use of aspirin: Secondary | ICD-10-CM | POA: Insufficient documentation

## 2015-04-06 DIAGNOSIS — J0110 Acute frontal sinusitis, unspecified: Secondary | ICD-10-CM

## 2015-04-06 DIAGNOSIS — I10 Essential (primary) hypertension: Secondary | ICD-10-CM | POA: Insufficient documentation

## 2015-04-06 DIAGNOSIS — R51 Headache: Secondary | ICD-10-CM | POA: Diagnosis present

## 2015-04-06 LAB — COMPREHENSIVE METABOLIC PANEL
ALT: 24 U/L (ref 17–63)
AST: 26 U/L (ref 15–41)
Albumin: 4.7 g/dL (ref 3.5–5.0)
Alkaline Phosphatase: 106 U/L (ref 38–126)
Anion gap: 9 (ref 5–15)
BUN: 11 mg/dL (ref 6–20)
CO2: 27 mmol/L (ref 22–32)
Calcium: 10.1 mg/dL (ref 8.9–10.3)
Chloride: 99 mmol/L — ABNORMAL LOW (ref 101–111)
Creatinine, Ser: 0.74 mg/dL (ref 0.61–1.24)
GFR calc Af Amer: >60 mL/min (ref >60)
GFR calc non Af Amer: >60 mL/min (ref >60)
Glucose, Bld: 183 mg/dL — ABNORMAL HIGH (ref 65–99)
Potassium: 4.4 mmol/L (ref 3.5–5.1)
Sodium: 135 mmol/L (ref 135–145)
Total Bilirubin: 0.9 mg/dL (ref 0.3–1.2)
Total Protein: 7.9 g/dL (ref 6.5–8.1)

## 2015-04-06 LAB — URINALYSIS, ROUTINE W REFLEX MICROSCOPIC
Bilirubin Urine: NEGATIVE
Glucose, UA: NEGATIVE mg/dL
Hgb urine dipstick: NEGATIVE
Ketones, ur: NEGATIVE mg/dL
Leukocytes, UA: NEGATIVE
Nitrite: NEGATIVE
Protein, ur: NEGATIVE mg/dL
Specific Gravity, Urine: 1.013 (ref 1.005–1.030)
pH: 6 (ref 5.0–8.0)

## 2015-04-06 LAB — SEDIMENTATION RATE: Sed Rate: 25 mm/hr — ABNORMAL HIGH (ref 0–16)

## 2015-04-06 LAB — CBC
HCT: 44.2 % (ref 39.0–52.0)
Hemoglobin: 15.5 g/dL (ref 13.0–17.0)
MCH: 34.5 pg — ABNORMAL HIGH (ref 26.0–34.0)
MCHC: 35.1 g/dL (ref 30.0–36.0)
MCV: 98.4 fL (ref 78.0–100.0)
Platelets: 224 10*3/uL (ref 150–400)
RBC: 4.49 MIL/uL (ref 4.22–5.81)
RDW: 12 % (ref 11.5–15.5)
WBC: 8.6 10*3/uL (ref 4.0–10.5)

## 2015-04-06 IMAGING — CT CT HEAD W/O CM
2 series · 16 of 30 positions shown, 20 images · non-contrast
Comparison: None.

CLINICAL DATA: Headache, dizziness and nausea for 9 days. No known
injury. Initial encounter.

EXAM:
CT HEAD WITHOUT CONTRAST
TECHNIQUE: Contiguous axial images were obtained from the base of the skull
through the vertex without intravenous contrast.

[Series 2: head w/o · axial · non-contrast · 0.44mm/px · z∈[+1295,+1425]mm · 13 of 32 slices shown, 17 images]
[im 3/32  brain]
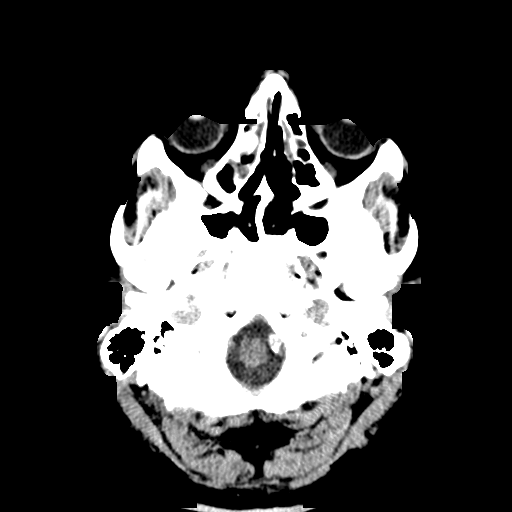
[im 3/32  bone]
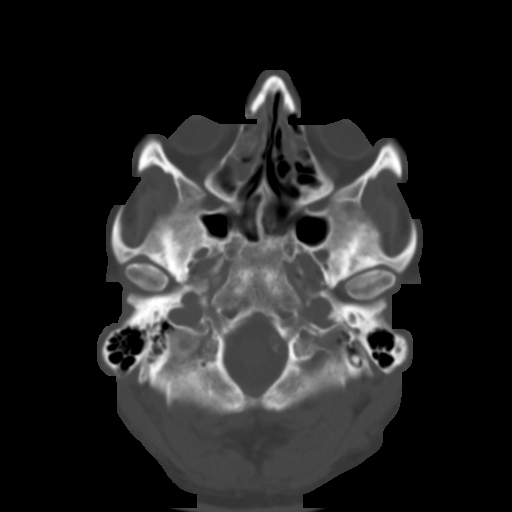
[im 5/32  brain]
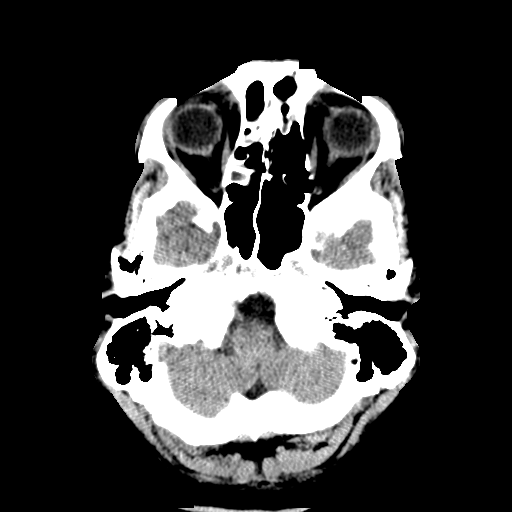
[im 7/32  brain]
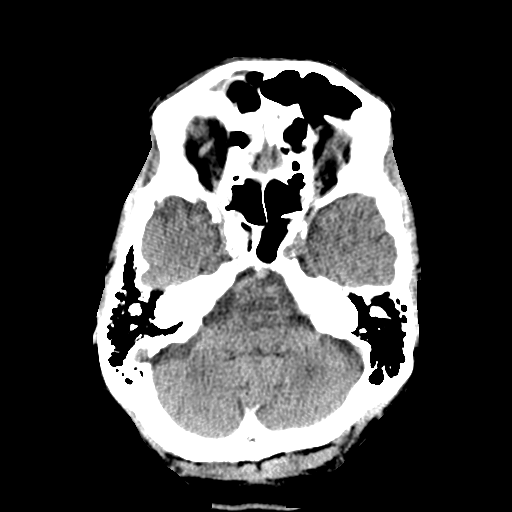
[im 9/32  brain]
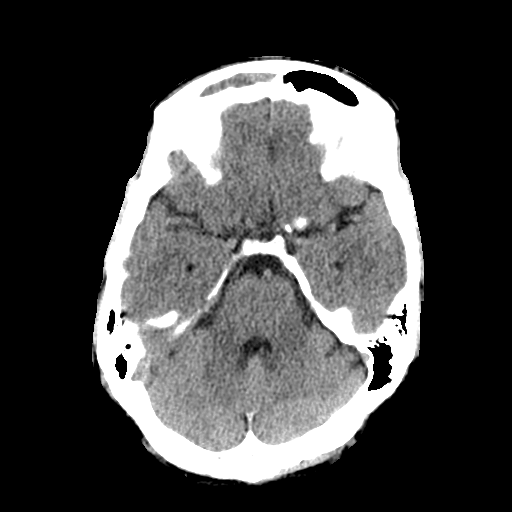
[im 12/32  brain]
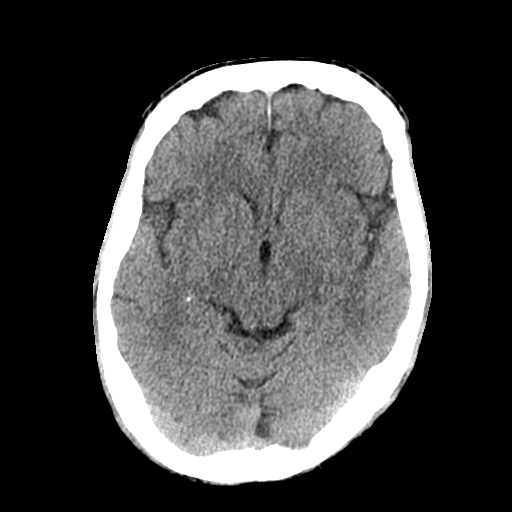
[im 12/32  bone]
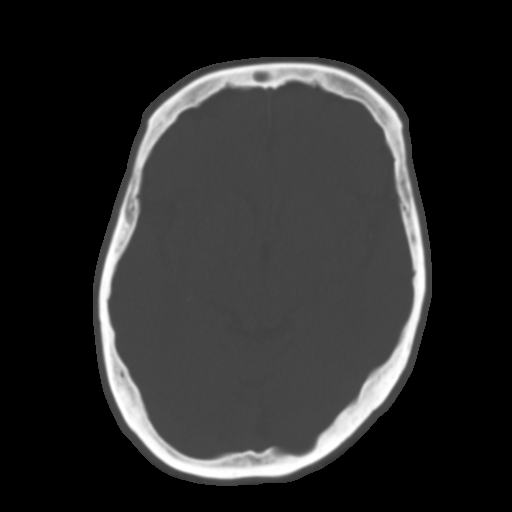
[im 14/32  brain]
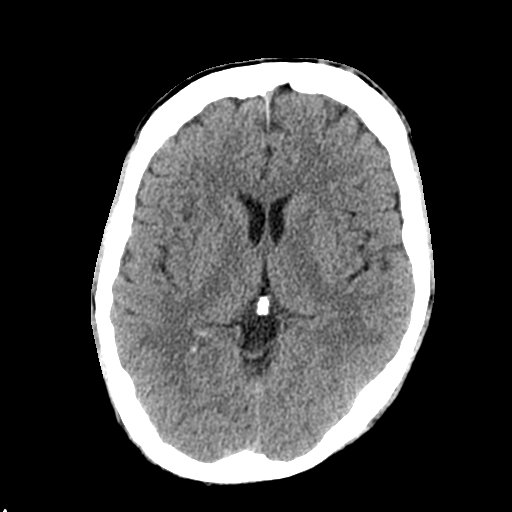
[im 16/32  brain]
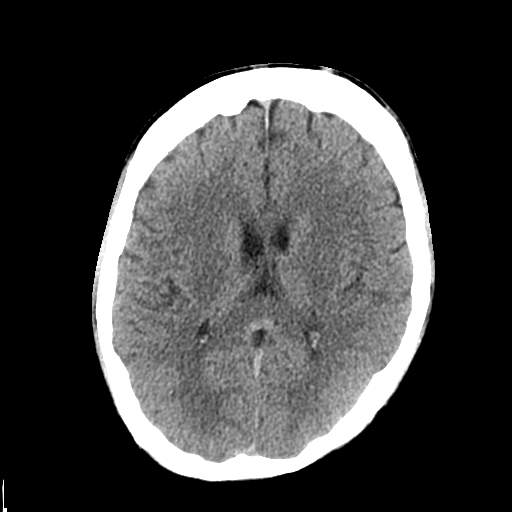
[im 18/32  brain]
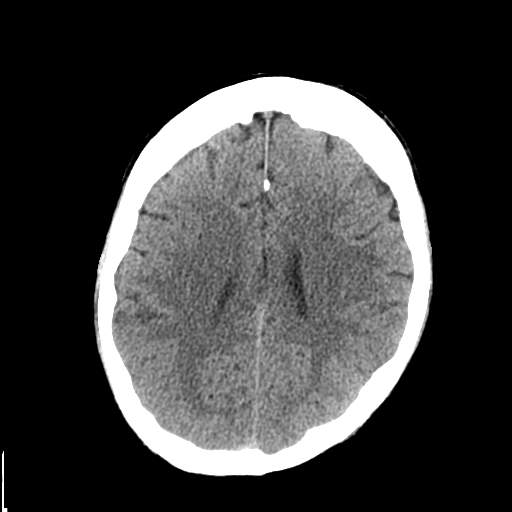
[im 20/32  brain]
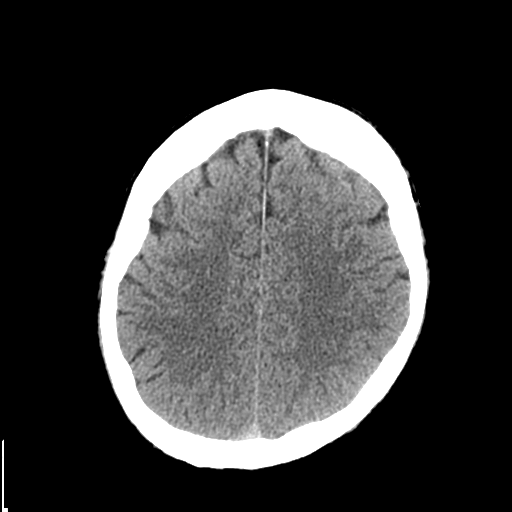
[im 20/32  bone]
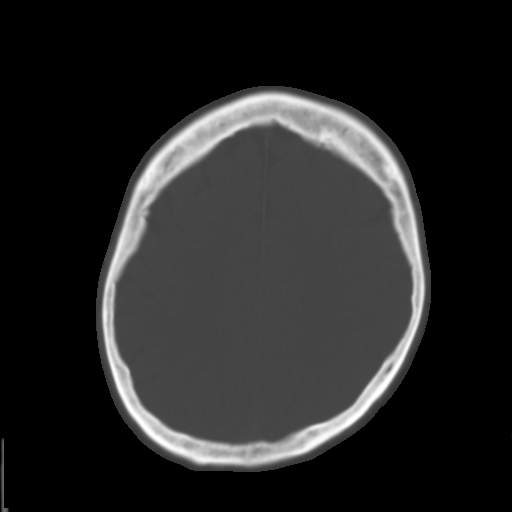
[im 23/32  brain]
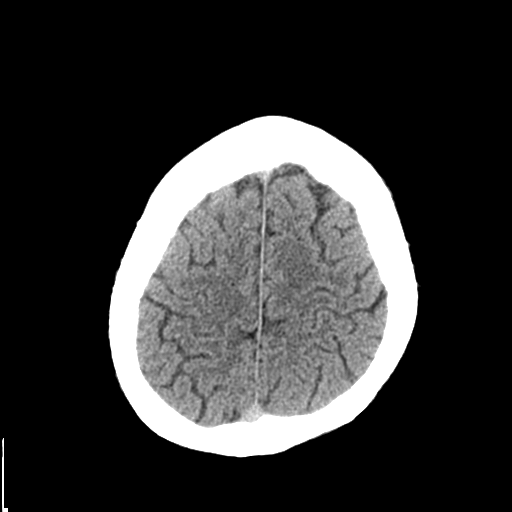
[im 25/32  brain]
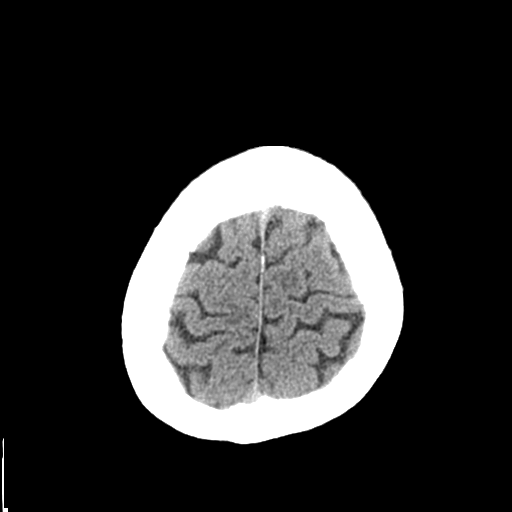
[im 27/32  brain]
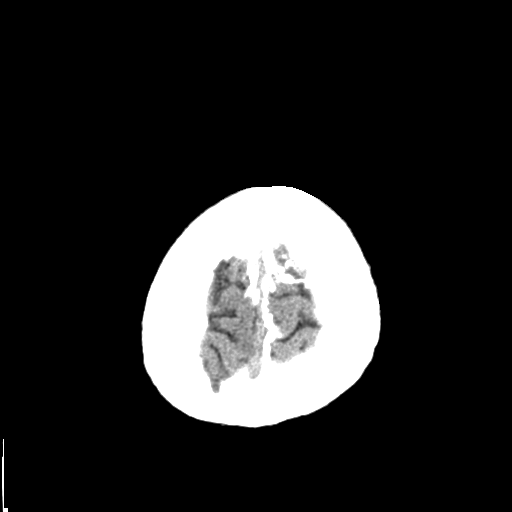
[im 29/32  brain]
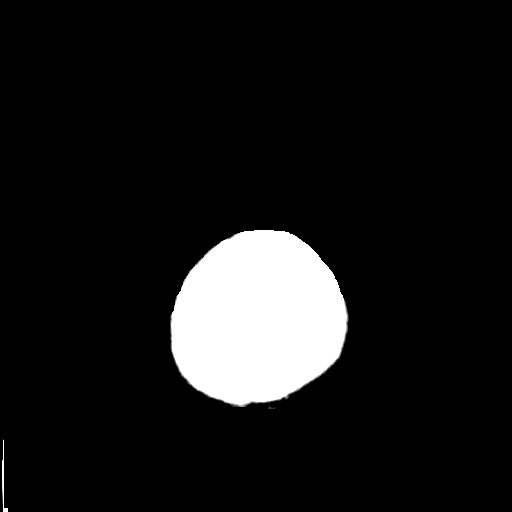
[im 29/32  bone]
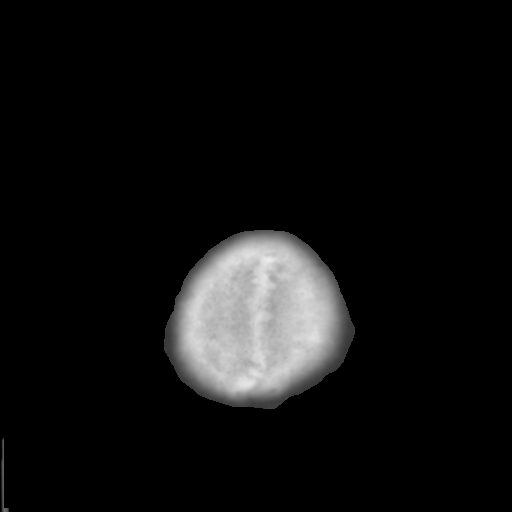

[Series 3: bone windows · axial · 0.44mm/px · z∈[+1295,+1340]mm · 3 of 32 slices shown]
[im 3/32  bone]
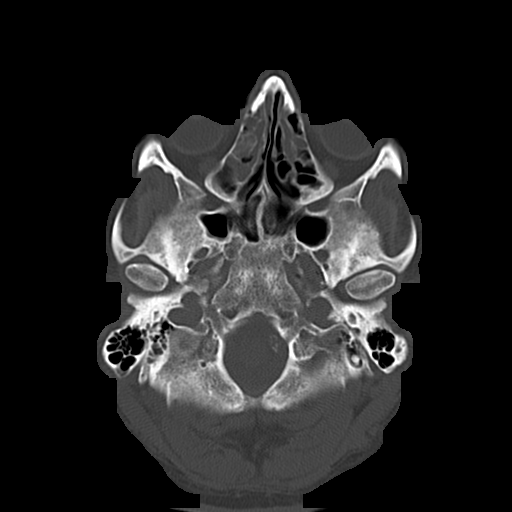
[im 7/32  bone]
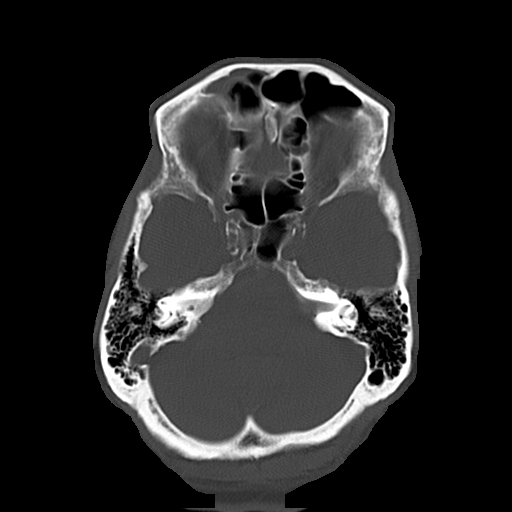
[im 12/32  bone]
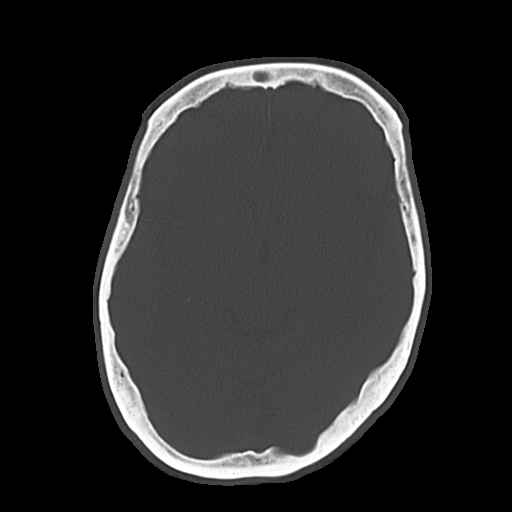

[16 of 30 positions shown; findings below may reference images not displayed]

FINDINGS: The brain appears normal without hemorrhage, infarct, mass lesion,
mass effect, midline shift or abnormal extra-axial fluid collection.
No hydrocephalus or pneumocephalus. The calvarium is intact. There
is partial visualization of marked mucosal thickening in the
maxillary sinuses. Fairly extensive ethmoid air cell disease is also
seen. The right frontal sinus is completely opacified. The calvarium
is intact.
IMPRESSION: No acute intracranial abnormality.

Extensive appearing sinus disease is incompletely imaged.

## 2015-04-06 MED ORDER — OXYCODONE-ACETAMINOPHEN 5-325 MG PO TABS: 1.0000 | ORAL_TABLET | ORAL | Status: DC | PRN

## 2015-04-06 MED ORDER — CLINDAMYCIN HCL 150 MG PO CAPS: 150.0000 mg | ORAL_CAPSULE | Freq: Four times a day (QID) | ORAL | Status: DC

## 2015-04-06 MED ORDER — PREDNISONE 50 MG PO TABS: ORAL_TABLET | ORAL | Status: DC

## 2015-04-06 MED ORDER — DIPHENHYDRAMINE HCL 50 MG/ML IJ SOLN
25.0000 mg | Freq: Once | INTRAMUSCULAR | Status: AC
  Administered 2015-04-06: 25 mg via INTRAVENOUS
  Filled 2015-04-06: qty 1

## 2015-04-06 MED ORDER — ONDANSETRON HCL 8 MG PO TABS: 8.0000 mg | ORAL_TABLET | Freq: Three times a day (TID) | ORAL | Status: DC | PRN

## 2015-04-06 MED ORDER — SODIUM CHLORIDE 0.9 % IV BOLUS (SEPSIS)
1000.0000 mL | Freq: Once | INTRAVENOUS | Status: AC
  Administered 2015-04-06: 1000 mL via INTRAVENOUS

## 2015-04-06 MED ORDER — METOCLOPRAMIDE HCL 5 MG/ML IJ SOLN
10.0000 mg | Freq: Once | INTRAMUSCULAR | Status: AC
  Administered 2015-04-06: 10 mg via INTRAVENOUS
  Filled 2015-04-06: qty 2

## 2015-04-06 MED ORDER — KETOROLAC TROMETHAMINE 30 MG/ML IJ SOLN
30.0000 mg | Freq: Once | INTRAMUSCULAR | Status: AC
  Administered 2015-04-06: 30 mg via INTRAVENOUS
  Filled 2015-04-06: qty 1

## 2015-04-06 NOTE — ED Notes (Signed)
Pt still unable to void at this time 

## 2015-04-06 NOTE — ED Notes (Signed)
Pt is attempting to provide urine specimen.

## 2015-04-06 NOTE — ED Notes (Signed)
PT FEELING BETTER. TOLERATED PO CHALLENGE. AWAITING ERA REEVAL.

## 2015-04-06 NOTE — ED Notes (Signed)
Patient transported to CT 

## 2015-04-06 NOTE — Discharge Instructions (Signed)
Sinusitis, Adult Sinusitis is redness, soreness, and puffiness (inflammation) of the air pockets in the bones of your face (sinuses). The redness, soreness, and puffiness can cause air and mucus to get trapped in your sinuses. This can allow germs to grow and cause an infection.  HOME CARE   Drink enough fluids to keep your pee (urine) clear or pale yellow.  Use a humidifier in your home.  Run a hot shower to create steam in the bathroom. Sit in the bathroom with the door closed. Breathe in the steam 3-4 times a day.  Put a warm, moist washcloth on your face 3-4 times a day, or as told by your doctor.  Use salt water sprays (saline sprays) to wet the thick fluid in your nose. This can help the sinuses drain.  Only take medicine as told by your doctor. GET HELP RIGHT AWAY IF:   Your pain gets worse.  You have very bad headaches.  You are sick to your stomach (nauseous).  You throw up (vomit).  You are very sleepy (drowsy) all the time.  Your face is puffy (swollen).  Your vision changes.  You have a stiff neck.  You have trouble breathing. MAKE SURE YOU:   Understand these instructions.  Will watch your condition.  Will get help right away if you are not doing well or get worse.   This information is not intended to replace advice given to you by your health care provider. Make sure you discuss any questions you have with your health care provider.   Document Released: 10/22/2007 Document Revised: 05/26/2014 Document Reviewed: 12/09/2011 Elsevier Interactive Patient Education 2016 Ross scan shows evidence of sinusitis. Will prescribe a new antibiotic, daily dose of prednisone, medicine for nausea, pain medication.  Rest. Recommend 1 Claritin daily.

## 2015-04-06 NOTE — ED Notes (Signed)
Pt c/o headache, dizziness, and nausea x 9 days.  Pain score 10/10.  Pt reports taking Vicodin x 2 this morning w/o relief.  Pt [facility] for same.  Sts he has been treated for a migraine and sinus infection.  Pt reports that he was working in a dirty basement prior to symptoms.

## 2015-04-06 NOTE — Telephone Encounter (Signed)
Left message advising pt. 

## 2015-04-06 NOTE — ED Notes (Signed)
Informed pt of need for urine specimen. Pt stated he just used restroom. Urinal given and instructed pt to call when he was able to provide specimen.

## 2015-04-07 NOTE — ED Provider Notes (Signed)
CSN: [id number]     Arrival date & time 04/06/15  [id number] History   First MD Initiated Contact with Patient 04/06/15 1118     Chief Complaint  Patient presents with  . Headache  . Dizziness  . Nausea     (Consider location/radiation/quality/duration/timing/severity/associated sxs/prior Treatment) HPI....Philip KitchenMarland Winters bifrontal headache for approximately 1 week with associated dizziness, headache. No fever or stiff neck. He has seen his primary care doctor 2-3 times. Different medications have been tried. He feels lots of pressure in his sinuses. No evidence of neurological deficits. He does not normally have headaches. No known history of sinus disease.  Past Medical History  Diagnosis Date  . Hypertension   . Seasonal allergies   . Allergy    Past Surgical History  Procedure Laterality Date  . Knee arthroscopy  [id number]    left and right  . Colonoscopy    . Carpal tunnel with cubital tunnel Left 11/10/2013    Procedure: LEFT CARPAL TUNNEL RELEASE;  Surgeon: Cammie Sickle, MD;  Location: [facility];  Service: Orthopedics;  Laterality: Left;  . Trigger finger release Left 11/10/2013    Procedure: LEFT INDEX A-1 PULLEY RELEASE;  Surgeon: Cammie Sickle, MD;  Location: [facility];  Service: Orthopedics;  Laterality: Left;  . Ulnar nerve transposition Left 11/10/2013    Procedure: ULNAR NERVE TRANSPOSITION;  Surgeon: Cammie Sickle, MD;  Location: [facility];  Service: Orthopedics;  Laterality: Left;   History reviewed. No pertinent family history. Social History  Substance Use Topics  . Smoking status: Current Every Day Smoker -- 1.00 packs/day for 37 years    Types: Cigarettes  . Smokeless tobacco: None  . Alcohol Use: 0.0 oz/week    0 Standard drinks or equivalent per week     Comment: occ    Review of Systems  All other systems reviewed and are negative.     Allergies  Penicillins; Codeine; and Iodine  Home Medications    Prior to Admission medications   Medication Sig Start Date End Date Taking? Authorizing Provider  aspirin 81 MG tablet Take 81 mg by mouth daily.   Yes Historical Provider, MD  diphenhydrAMINE (BENADRYL) 25 MG tablet Take 25 mg by mouth every 6 (six) hours as needed for allergies.    Yes Historical Provider, MD  doxycycline (VIBRA-TABS) 100 MG tablet Take 1 tablet by mouth 2 (two) times daily. 04/01/15  Yes Historical Provider, MD  HYDROcodone-acetaminophen (NORCO) 5-325 MG tablet Take 1 tablet by mouth every 6 (six) hours as needed for moderate pain. 04/03/15  Yes Bennett Scrape V, PA-C  levofloxacin (LEVAQUIN) 500 MG tablet Take 1 tablet (500 mg total) by mouth daily. 04/05/15  Yes Tereasa Coop, PA-C  Multiple Vitamin (MULTIVITAMIN WITH MINERALS) TABS tablet Take 1 tablet by mouth daily.   Yes Historical Provider, MD  naproxen (NAPROSYN) 500 MG tablet Take 1 tablet by mouth 2 (two) times daily as needed. pain 01/06/15  Yes Historical Provider, MD  sodium chloride (OCEAN) 0.65 % SOLN nasal spray Place 1 spray into both nostrils 3 (three) times daily as needed for congestion.   Yes Historical Provider, MD  Tetrahydrozoline HCl (VISINE OP) Apply 1 drop to eye daily.   Yes Historical Provider, MD  valsartan (DIOVAN) 320 MG tablet Take 320 mg by mouth daily.   Yes Historical Provider, MD  clindamycin (CLEOCIN) 150 MG capsule Take 1 capsule (150 mg total) by mouth every 6 (six) hours.  04/06/15   Nat Christen, MD  ondansetron (ZOFRAN) 8 MG tablet Take 1 tablet (8 mg total) by mouth every 8 (eight) hours as needed for nausea or vomiting. 04/06/15   Nat Christen, MD  oxyCODONE-acetaminophen (PERCOCET) 5-325 MG tablet Take 1-2 tablets by mouth every 4 (four) hours as needed. 04/06/15   Nat Christen, MD  predniSONE (DELTASONE) 50 MG tablet One tab daily for 7 days 04/06/15   Nat Christen, MD   BP 130/77 mmHg  Pulse 91  Temp(Src) 97.5 F (36.4 C) (Oral)  Resp 16  SpO2 97% Physical Exam  Constitutional: He  is oriented to person, place, and time. He appears well-developed and well-nourished.  HENT:  Head: Normocephalic and atraumatic.  Bilateral frontal maxillary sinus tenderness  Eyes: Conjunctivae and EOM are normal. Pupils are equal, round, and reactive to light.  Neck: Normal range of motion. Neck supple.  Cardiovascular: Normal rate and regular rhythm.   Pulmonary/Chest: Effort normal and breath sounds normal.  Abdominal: Soft. Bowel sounds are normal.  Musculoskeletal: Normal range of motion.  Neurological: He is alert and oriented to person, place, and time.  Skin: Skin is warm and dry.  Psychiatric: He has a normal mood and affect. His behavior is normal.  Nursing note and vitals reviewed.   ED Course  Procedures (including critical care time) Labs Review Labs Reviewed  COMPREHENSIVE METABOLIC PANEL - Abnormal; Notable for the following:    Chloride 99 (*)    Glucose, Bld 183 (*)    All other components within normal limits  CBC - Abnormal; Notable for the following:    MCH 34.5 (*)    All other components within normal limits  SEDIMENTATION RATE - Abnormal; Notable for the following:    Sed Rate 25 (*)    All other components within normal limits  URINALYSIS, ROUTINE W REFLEX MICROSCOPIC (NOT AT [facility])    Imaging Review Ct Head Wo Contrast  04/06/2015  CLINICAL DATA:  Headache, dizziness and nausea for 9 days. No known injury. Initial encounter. EXAM: CT HEAD WITHOUT CONTRAST TECHNIQUE: Contiguous axial images were obtained from the base of the skull through the vertex without intravenous contrast. COMPARISON:  None. FINDINGS: The brain appears normal without hemorrhage, infarct, mass lesion, mass effect, midline shift or abnormal extra-axial fluid collection. No hydrocephalus or pneumocephalus. The calvarium is intact. There is partial visualization of marked mucosal thickening in the maxillary sinuses. Fairly extensive ethmoid air cell disease is also seen. The right  frontal sinus is completely opacified. The calvarium is intact. IMPRESSION: No acute intracranial abnormality. Extensive appearing sinus disease is incompletely imaged. Electronically Signed   By: Inge Rise M.D.   On: 04/06/2015 13:42   I have personally reviewed and evaluated these images and lab results as part of my medical decision-making.   EKG Interpretation None      MDM   Final diagnoses:  Acute frontal sinusitis, recurrence not specified    No neurological deficits. CT head shows extensive sinus disease. He is penicillin allergic. Discharge medications clindamycin, Percocet, prednisone, Zofran 8 mg.    Nat Christen, MD 04/07/15 [phone]

## 2015-04-23 NOTE — Progress Notes (Signed)
Patient ID: Philip Winters, male   DOB: [birth year], 54 y.o.   MRN: [id number] Reviewed documentation and xray and agree w/ assessment and plan. Philip Cheadle, MD MPH

## 2015-05-20 HISTORY — PX: NASAL SINUS SURGERY: SHX719

## 2016-03-27 DIAGNOSIS — M25562 Pain in left knee: Secondary | ICD-10-CM | POA: Insufficient documentation

## 2016-04-24 ENCOUNTER — Ambulatory Visit (INDEPENDENT_AMBULATORY_CARE_PROVIDER_SITE_OTHER): Payer: Self-pay

## 2016-04-24 ENCOUNTER — Ambulatory Visit (INDEPENDENT_AMBULATORY_CARE_PROVIDER_SITE_OTHER): Payer: 59 | Admitting: Orthopaedic Surgery

## 2016-04-24 ENCOUNTER — Ambulatory Visit (INDEPENDENT_AMBULATORY_CARE_PROVIDER_SITE_OTHER): Payer: 59

## 2016-04-24 ENCOUNTER — Encounter (INDEPENDENT_AMBULATORY_CARE_PROVIDER_SITE_OTHER): Payer: Self-pay | Admitting: Orthopaedic Surgery

## 2016-04-24 VITALS — BP 127/79 | HR 110 | Ht 66.0 in | Wt 160.0 lb

## 2016-04-24 DIAGNOSIS — M25561 Pain in right knee: Secondary | ICD-10-CM

## 2016-04-24 DIAGNOSIS — G8929 Other chronic pain: Secondary | ICD-10-CM

## 2016-04-24 NOTE — Progress Notes (Signed)
Office Visit Note   Patient: Philip Winters           Date of Birth: 24-Apr-1961           MRN: [id number] Visit Date: 04/24/2016              Requested by: Philip Contras, MD [address] [place], [place] [id number] PCP: Gara Kroner, MD   Assessment & Plan: Visit Diagnoses: Tricompartmental degenerative joint disease right knee with increased varus and predominantly medial compartment arthrosis  Plan: Long discussion of the pathology with Mr. and Philip Winters. He is seeking a second opinion regarding his knee. A partial knee replacement has been offered. He wanted to discuss total knee replacement based on experience of friends. My opinion would be a total knee replacement after much discussion. I do not think that a partial i.e. medial compartment replacement, is the wrong decision, but I think in the long run that to be better treated with a total knee replacement. We have discussed the surgery potential problems hospitalization and rehabilitation. He is in the midst of smoking cessation  Follow-Up Instructions: No Follow-up on file.   Orders:  No orders of the defined types were placed in this encounter.  No orders of the defined types were placed in this encounter.     Procedures: No procedures performed   Clinical Data: No additional findings.   Subjective: No chief complaint on file.   Pt is here for a second opinion for Right knee surgery.  Review of Systems  Philip Winters has had a chronic problem with his right knee. He's had a prior open meniscectomy followed by several knee arthroscopies over time. He's had a number of cortisone injections and even Visco supplementation without relief of his pain. He has tried nonsteroidal anti-inflammatory medicines. 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 He is having trouble sleeping at night bending stooping and squatting.  Objective: Vital Signs: There were no vitals taken for  this visit.  Physical Exam  Ortho Exam examination of the right knee revealed incompleted knee extension of approximately 10-11. There was no effusion. There was predominantly medial joint pain with palpable osteophytes. He's had a well-healed medial incision from prior open meniscectomy. There was a minimal amount of pain in the lateral compartment. There was patellar crepitation. I do not feel any popliteal masses there is no swelling distally. The foot was warm with +1 pulses. Was no pain with range of motion of either hip.   Specialty Comments:  No specialty comments available.  Imaging: No results found.   PMFS History: Patient Active Problem List   Diagnosis Date Noted  . HYPERLIPIDEMIA 01/18/2007  . HYPERTENSION 01/18/2007   Past Medical History:  Diagnosis Date  . Allergy   . Hypertension   . Seasonal allergies     No family history on file.  Past Surgical History:  Procedure Laterality Date  . CARPAL TUNNEL WITH CUBITAL TUNNEL Left 11/10/2013   Procedure: LEFT CARPAL TUNNEL RELEASE;  Surgeon: Cammie Sickle, MD;  Location: [facility];  Service: Orthopedics;  Laterality: Left;  . COLONOSCOPY    . KNEE ARTHROSCOPY  [id number]   left and right  . TRIGGER FINGER RELEASE Left 11/10/2013   Procedure: LEFT INDEX A-1 PULLEY RELEASE;  Surgeon: Cammie Sickle, MD;  Location: [facility];  Service: Orthopedics;  Laterality: Left;  . ULNAR NERVE TRANSPOSITION Left 11/10/2013   Procedure:  ULNAR NERVE TRANSPOSITION;  Surgeon: Cammie Sickle, MD;  Location: [facility];  Service: Orthopedics;  Laterality: Left;   Social History   Occupational History  . Not on file.   Social History Main Topics  . Smoking status: Current Every Day Smoker    Packs/day: 1.00    Years: 37.00    Types: Cigarettes  . Smokeless tobacco: Not on file  . Alcohol use 0.0 oz/week     Comment: occ  . Drug use: No  . Sexual activity: Not on file

## 2016-05-06 ENCOUNTER — Other Ambulatory Visit (HOSPITAL_COMMUNITY): Payer: Self-pay | Admitting: Otolaryngology

## 2016-05-06 DIAGNOSIS — J321 Chronic frontal sinusitis: Secondary | ICD-10-CM

## 2016-05-07 ENCOUNTER — Ambulatory Visit (HOSPITAL_COMMUNITY)
Admission: RE | Admit: 2016-05-07 | Discharge: 2016-05-07 | Disposition: A | Discharge status: Home / Self Care | Payer: 59 | Source: Ambulatory Visit | Attending: Otolaryngology | Admitting: Otolaryngology

## 2016-05-07 DIAGNOSIS — J321 Chronic frontal sinusitis: Secondary | ICD-10-CM | POA: Diagnosis not present

## 2016-05-07 DIAGNOSIS — J3489 Other specified disorders of nose and nasal sinuses: Secondary | ICD-10-CM | POA: Diagnosis not present

## 2016-05-07 IMAGING — CT CT MAXILLOFACIAL W/O CM
3 of 4 series · 13 of 47 positions shown, 15 images · non-contrast
Comparison: Prior CT from [DATE].

CLINICAL DATA: Initial evaluation for chronic frontal sinusitis.
Headaches.

EXAM:
CT MAXILLOFACIAL WITHOUT CONTRAST
TECHNIQUE: Priors new from [DATE].

[Series 2: max soft · axial · 0.35mm/px · z∈[-65,+35]mm · 8 of 117 slices shown, 10 images]
[im 9/117  brain]
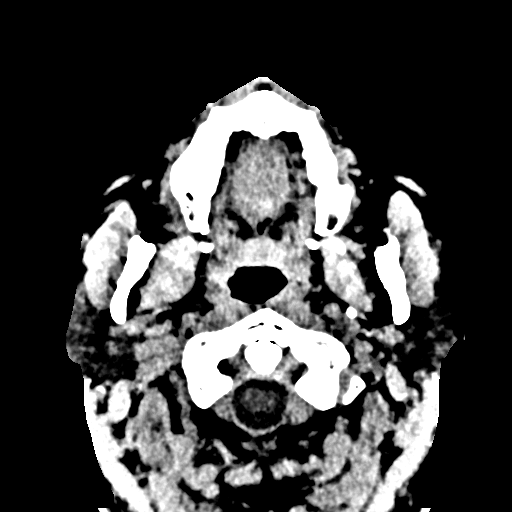
[im 9/117  bone]
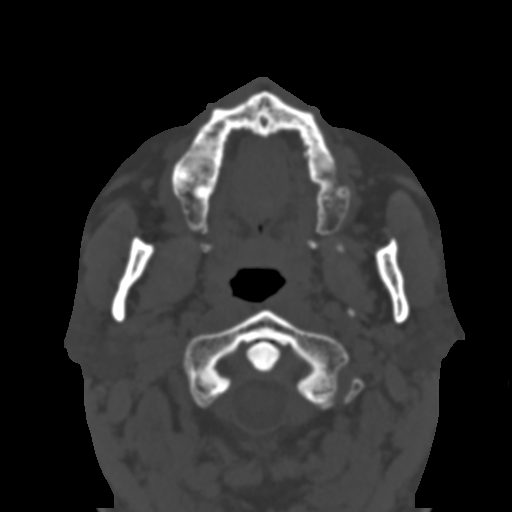
[im 25/117  bone]
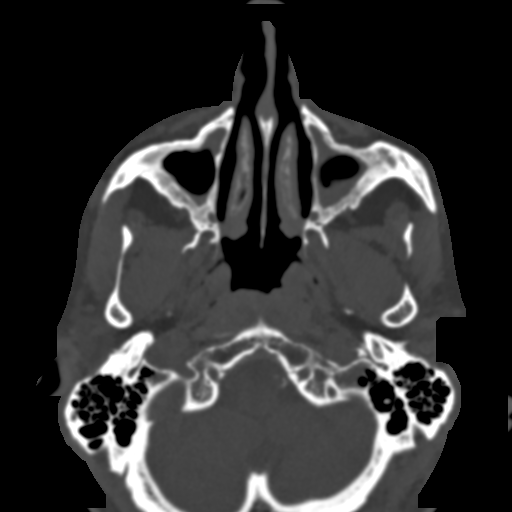
[im 37/117  bone]
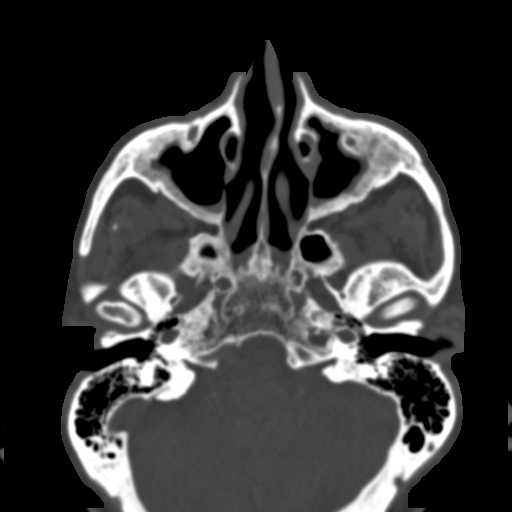
[im 53/117  bone]
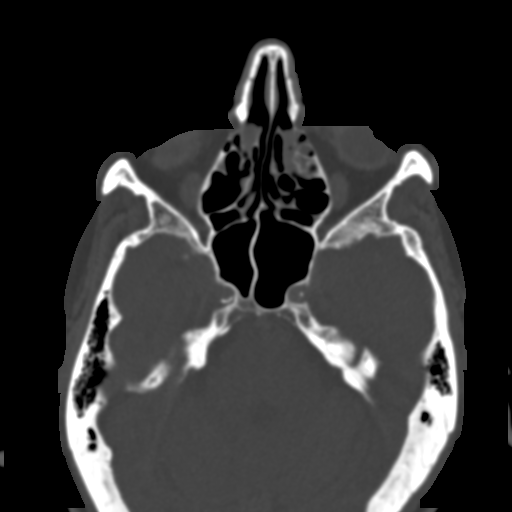
[im 65/117  brain]
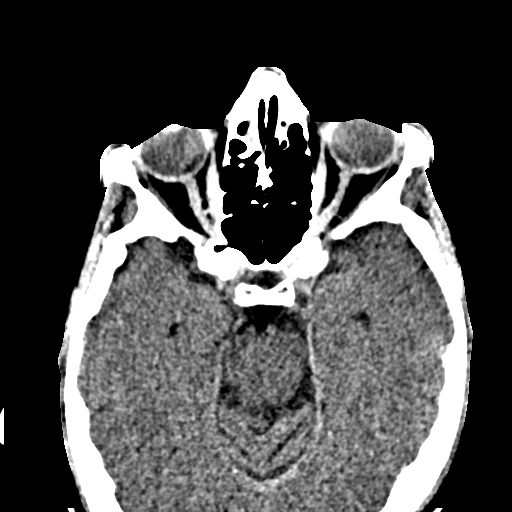
[im 65/117  bone]
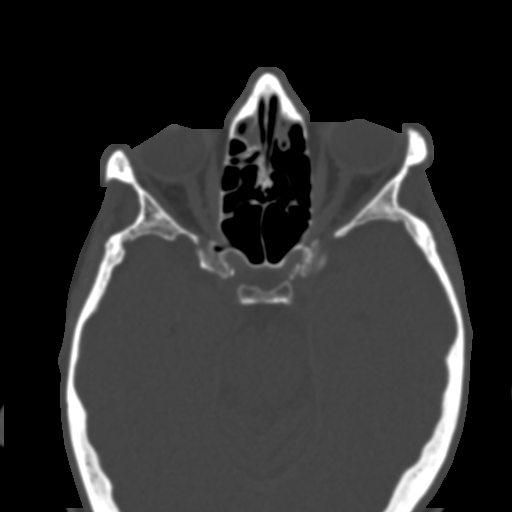
[im 81/117  bone]
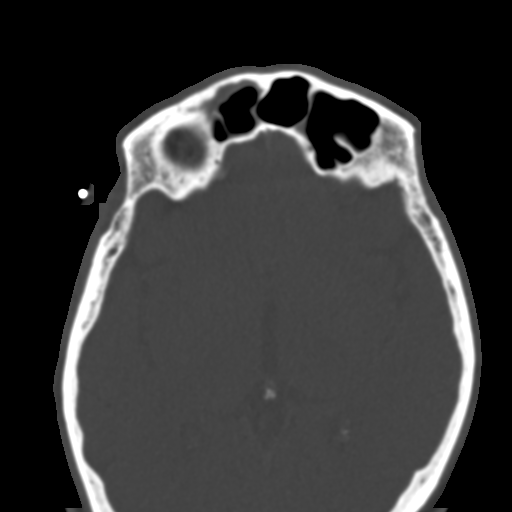
[im 93/117  bone]
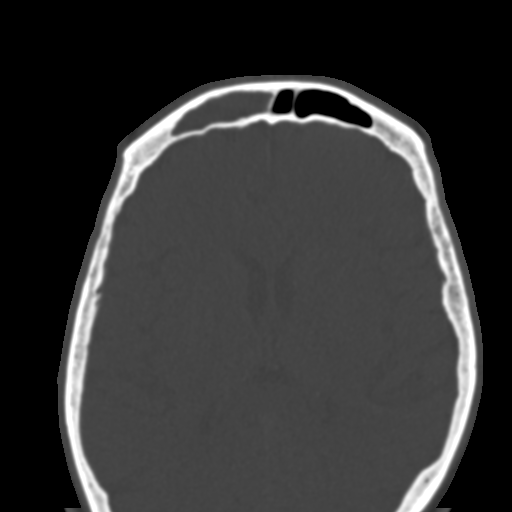
[im 109/117  bone]
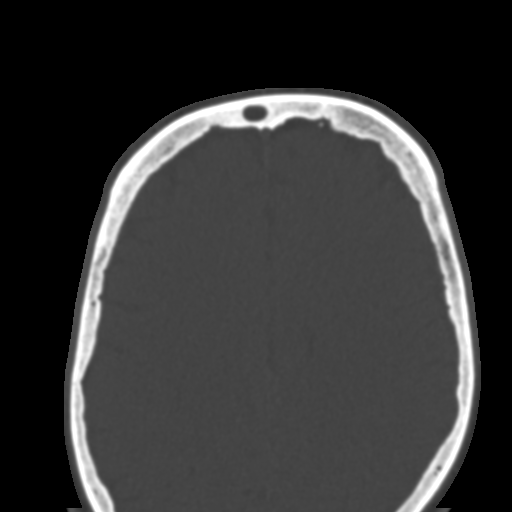

[Series 6: coronal soft · coronal · 0.26mm/px · 3 of 138 slices shown]
[im 46/138  bone]
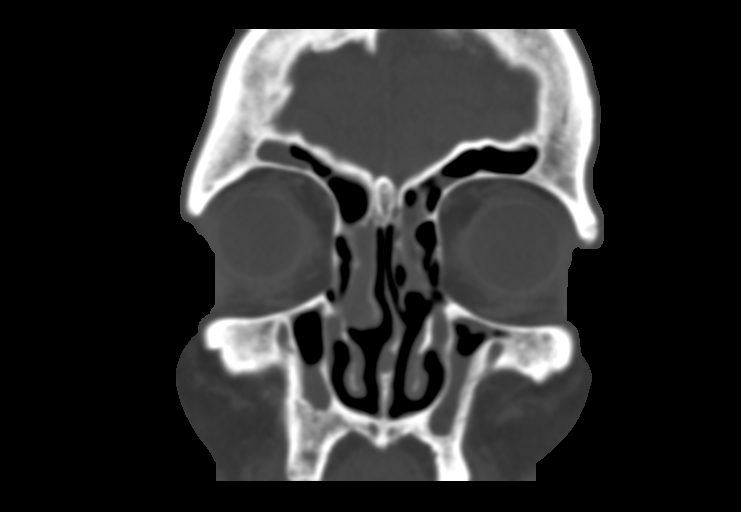
[im 61/138  bone]
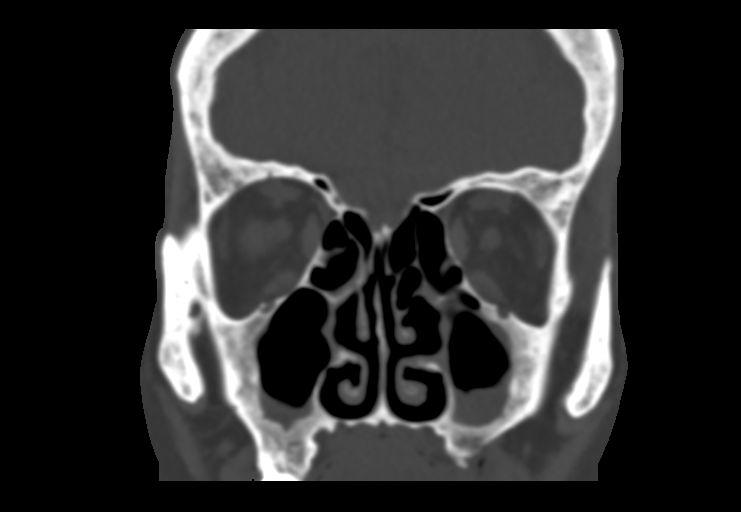
[im 77/138  bone]
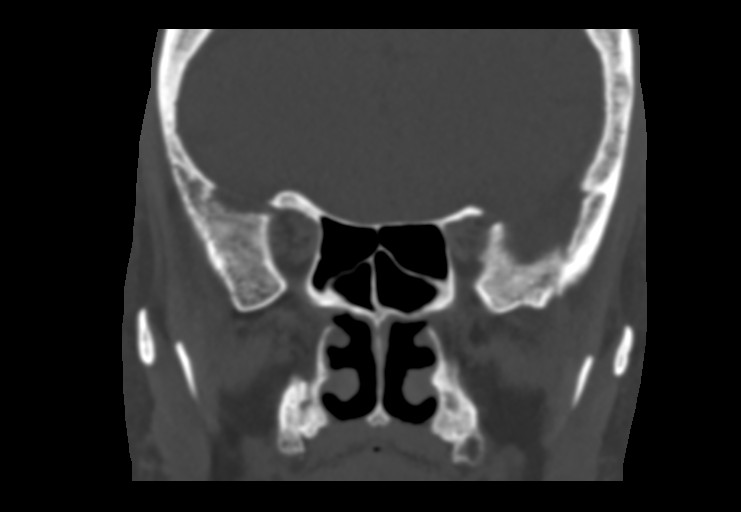

[Series 9: sagittal bone · sagittal · 0.25mm/px · 2 of 180 slices shown]
[im 60/180  bone]
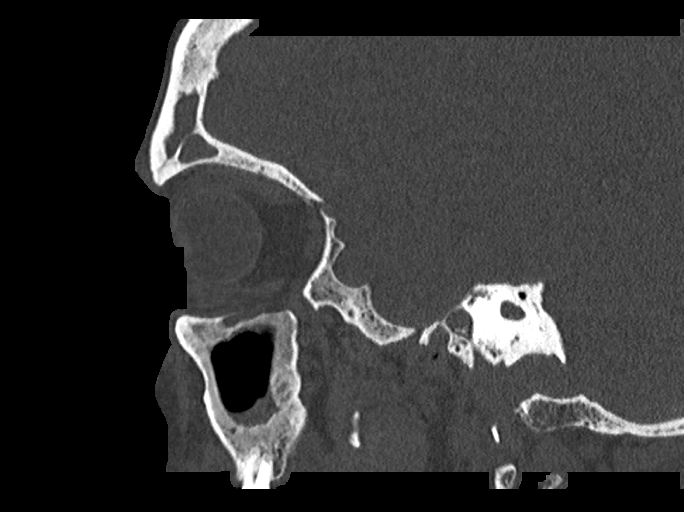
[im 120/180  bone]
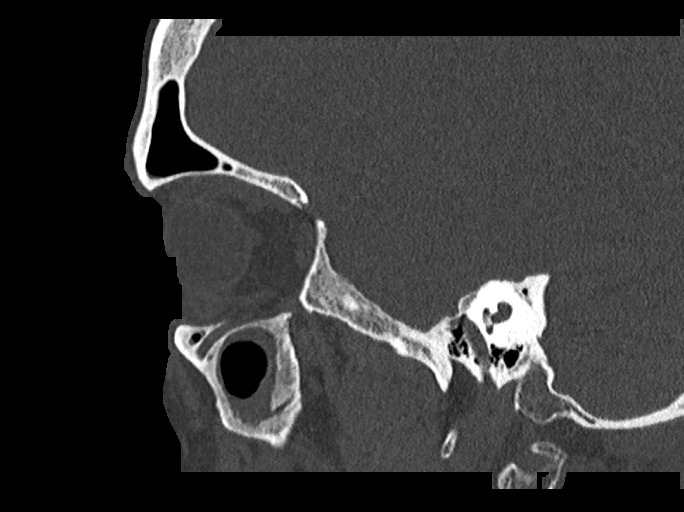

[13 of 47 positions shown; findings below may reference images not displayed]

FINDINGS: Osseous: Visualized osseous structures of the face demonstrate no
acute abnormality. No focal osseous lesions. Temporomandibular
joints are symmetric in appearance bilaterally and within normal
limits without or erosive changes.

Orbits: Globes and orbital soft tissues within normal limits.

Sinuses: The right frontal sinus is nearly completely opacified.
There are predominantly air-filled ethmoidal air cells involving and
extending above the right frontoethmoidal recess into the inferior
aspect of the right frontal sinus (series 8, image 43), likely
obstructive. Thickening and reactive sclerosis about the right
frontal sinus compatible with chronic sinusitis. The inner and outer
tables of the right frontal sinus are intact without osseous
dehiscence.

Mucosal thickening with partial opacification present at the left
frontoethmoidal recess. The left frontal sinus is otherwise clear.
An osseous septation extends from the left lamina papyracea
superiorly to divide the left frontal sinus into a lateral and
central divisions.

Scattered opacity with mucosal thickening involve the anterior and
posterior ethmoidal air cells bilaterally, slightly worse on the
right. Lamina papyracea are intact. Cribriform plate grossly intact.

Moderate mucosal thickening within the maxillary sinuses
bilaterally, left greater than right. Thickening with reactive
sclerosis about the maxillary sinuses compatible with chronic
sinusitis. No osseous dehiscence. Probable small superimposed fluid
levels suggest acute on chronic sinusitis. Mucosal thickening
extends into the ostiomeatal units bilaterally, which is partially
occluded on the left. Right OMU remains patent.

Sphenoid sinuses are clear. Sphenoethmoidal foramen patent. Mild
reactive osseous thickening about the sphenoid sinuses compatible
with chronic sinusitis.

Mild leftward bowing of the nasal septum. Polypoid opacity arising
from the right middle turbinate suspected to reflect a polyp (series
3, image 74). This is suspected to be obstructive given the
opacification of the right ethmoidal air cells superiorly. Nasal
cavities otherwise patent.

Mastoid air cells are clear. Middle ear cavities are well
pneumatized.

Soft tissues: Visualized soft tissues of the face demonstrate no
acute abnormality. Nasopharynx and visualized oropharynx within
normal limits.

Limited intracranial: Visualized portions of the brain are within
normal limits. Intracranial atherosclerosis noted.
IMPRESSION: 1. Chronic paranasal sinus disease involving primarily the right
frontal sinus and bilateral maxillary sinuses. Air-fluid levels
within the maxillary sinuses suggest acute on chronic sinusitis.
Please see above report for a full description of these findings.
2. Polypoid opacity arising from the lateral aspect of the right
middle turbinate, likely a small polyp.
3. Mild leftward bowing of the nasal septum.

## 2016-05-08 ENCOUNTER — Ambulatory Visit (HOSPITAL_COMMUNITY): Payer: 59

## 2016-05-29 DIAGNOSIS — J328 Other chronic sinusitis: Secondary | ICD-10-CM | POA: Diagnosis not present

## 2016-05-29 DIAGNOSIS — J0120 Acute ethmoidal sinusitis, unspecified: Secondary | ICD-10-CM | POA: Diagnosis not present

## 2016-05-29 DIAGNOSIS — J338 Other polyp of sinus: Secondary | ICD-10-CM | POA: Diagnosis not present

## 2016-05-29 DIAGNOSIS — J321 Chronic frontal sinusitis: Secondary | ICD-10-CM | POA: Diagnosis not present

## 2016-05-29 DIAGNOSIS — J322 Chronic ethmoidal sinusitis: Secondary | ICD-10-CM | POA: Diagnosis not present

## 2016-05-29 DIAGNOSIS — J0110 Acute frontal sinusitis, unspecified: Secondary | ICD-10-CM | POA: Diagnosis not present

## 2016-05-29 DIAGNOSIS — J320 Chronic maxillary sinusitis: Secondary | ICD-10-CM | POA: Diagnosis not present

## 2016-05-29 DIAGNOSIS — J0100 Acute maxillary sinusitis, unspecified: Secondary | ICD-10-CM | POA: Diagnosis not present

## 2016-06-02 ENCOUNTER — Telehealth (INDEPENDENT_AMBULATORY_CARE_PROVIDER_SITE_OTHER): Payer: Self-pay | Admitting: Orthopaedic Surgery

## 2016-06-02 NOTE — Telephone Encounter (Signed)
Called and left message.

## 2016-06-02 NOTE — Telephone Encounter (Signed)
Patient states he had a sinus surgery last week on 05/29/16 and is scheduled for surgery with Dr. Durward Fortes on 06/24/16 and would like to know if that sinus surgery is going to interfere with upcoming surgery? Please call patient.

## 2016-06-02 NOTE — Telephone Encounter (Signed)
Please advise 

## 2016-06-11 ENCOUNTER — Encounter (INDEPENDENT_AMBULATORY_CARE_PROVIDER_SITE_OTHER): Payer: Self-pay | Admitting: Orthopedic Surgery

## 2016-06-11 ENCOUNTER — Ambulatory Visit (INDEPENDENT_AMBULATORY_CARE_PROVIDER_SITE_OTHER): Payer: 59 | Admitting: Orthopedic Surgery

## 2016-06-11 VITALS — BP 138/78 | HR 94 | Temp 98.0°F | Resp 14 | Ht 68.0 in | Wt 152.0 lb

## 2016-06-11 DIAGNOSIS — M1711 Unilateral primary osteoarthritis, right knee: Secondary | ICD-10-CM | POA: Diagnosis not present

## 2016-06-11 NOTE — H&P (Signed)
Philip Fears, MD   Biagio Borg, PA-C [address], [place], [place]  [address]                             [phone]   ORTHOPAEDIC HISTORY & PHYSICAL  JACOBB Winters MRN:  [id number] DOB/SEX:  [birth year]/male  CHIEF COMPLAINT:  Painful right Knee  HISTORY: Patient is a 56 y.o. male presented with a history of pain in the right knee for 14 years.. Onset of symptoms was gradual starting 2 years ago with rapidly worsening course since that time. Prior procedures on the knee are arthroscopy. Patient has been treated conservatively with over-the-counter NSAIDs and activity modification. Patient currently rates pain in the knee at 8 out of 10 with activity. There is pain at night. present.  They have been previously treated with: NSAIDS: Narcotics, NSAID with mild improvement  Knee injection with corticosteroid  was performed Knee injection with visco supplementation was performed Medications: NSAID, Steriods, Snyvisc with mild improvement  PAST MEDICAL HISTORY: Patient Active Problem List   Diagnosis Date Noted  . HYPERLIPIDEMIA 01/18/2007  . HYPERTENSION 01/18/2007   Past Medical History:  Diagnosis Date  . Allergy   . Hypertension   . Seasonal allergies    Past Surgical History:  Procedure Laterality Date  . CARPAL TUNNEL WITH CUBITAL TUNNEL Left 11/10/2013   Procedure: LEFT CARPAL TUNNEL RELEASE;  Surgeon: Cammie Sickle, MD;  Location: [facility];  Service: Orthopedics;  Laterality: Left;  . COLONOSCOPY    . KNEE ARTHROSCOPY  [id number]   left and right  . TRIGGER FINGER RELEASE Left 11/10/2013   Procedure: LEFT INDEX A-1 PULLEY RELEASE;  Surgeon: Cammie Sickle, MD;  Location: [facility];  Service: Orthopedics;  Laterality: Left;  . ULNAR NERVE TRANSPOSITION Left 11/10/2013   Procedure: ULNAR NERVE TRANSPOSITION;  Surgeon: Cammie Sickle, MD;  Location: [facility];  Service: Orthopedics;  Laterality: Left;       MEDICATIONS PRIOR TO ADMISSION: Prior to Admission medications   Medication Sig Start Date End Date Taking? Authorizing Provider  aspirin 81 MG tablet Take 81 mg by mouth daily.   Yes Historical Provider, MD  buPROPion [facility] SR) 150 MG 12 hr tablet  04/19/16  Yes Historical Provider, MD  diphenhydrAMINE (BENADRYL) 25 MG tablet Take 25 mg by mouth every 6 (six) hours as needed for allergies.    Yes Historical Provider, MD  ibuprofen (ADVIL,MOTRIN) 200 MG tablet Take 200 mg by mouth every 6 (six) hours as needed.   Yes Historical Provider, MD  Multiple Vitamin (MULTIVITAMIN WITH MINERALS) TABS tablet Take 1 tablet by mouth daily.   Yes Historical Provider, MD  Tetrahydrozoline HCl (VISINE OP) Apply 1 drop to eye daily.   Yes Historical Provider, MD  valsartan (DIOVAN) 320 MG tablet Take 320 mg by mouth daily.   Yes Historical Provider, MD  clindamycin (CLEOCIN) 150 MG capsule TK 1 C PO TID 05/25/16   Historical Provider, MD  HYDROcodone-acetaminophen (NORCO) 5-325 MG tablet Take 1 tablet by mouth every 6 (six) hours as needed for moderate pain. Patient not taking: Reported on 04/24/2016 04/03/15   Ezekiel Slocumb, PA-C  sodium chloride (OCEAN) 0.65 % SOLN nasal spray Place 1 spray into both nostrils 3 (three) times daily as needed for congestion.    Historical Provider, MD     ALLERGIES:   Allergies  Allergen Reactions  .  Penicillins Shortness Of Breath    Childhood allergy.  Has patient had a PCN reaction causing immediate rash, facial/tongue/throat swelling, SOB or lightheadedness with hypotension: No Has patient had a PCN reaction causing severe rash involving mucus membranes or skin necrosis: No Has patient had a PCN reaction that required hospitalization No Has patient had a PCN reaction occurring within the last 10 years: No If all of the above answers are "NO", then may proceed with C  . Codeine Nausea And Vomiting  . Iodine Rash    REVIEW OF SYSTEMS:  Review of Systems   Constitutional: Negative.   HENT: Negative.   Respiratory: Negative.   Cardiovascular:       Hypertension   Gastrointestinal: Negative.   Genitourinary: Negative.   Skin: Negative.   Neurological: Negative.   Endo/Heme/Allergies: Negative.     FAMILY HISTORY:  No family history on file.  SOCIAL HISTORY:   Social History   Occupational History  . Not on file.   Social History Main Topics  . Smoking status: Former Smoker    Packs/day: 1.00    Years: 37.00    Types: Cigarettes    Quit date: 05/19/2016  . Smokeless tobacco: Never Used  . Alcohol use 0.0 oz/week     Comment: occ  . Drug use: No  . Sexual activity: Not on file     EXAMINATION:  Vital signs in last 24 hours: BP 138/78   Pulse 94   Temp 98 F (36.7 C)   Resp 14   Ht 5\' 8"  (1.727 m)   Wt 152 lb (68.9 kg)   BMI 23.11 kg/m   Physical Exam  Constitutional: He is oriented to person, place, and time. He appears well-developed and well-nourished.  HENT:  Head: Normocephalic and atraumatic.  Eyes: Conjunctivae and EOM are normal. Pupils are equal, round, and reactive to light.  Neck: Neck supple.  No carotid bruits  Cardiovascular: Normal rate, regular rhythm, normal heart sounds and intact distal pulses.   Pulmonary/Chest: Effort normal and breath sounds normal.  Abdominal: Soft. Bowel sounds are normal. There is no tenderness.  Musculoskeletal:       Right knee: He exhibits effusion.  Neurological: He is alert and oriented to person, place, and time.  Skin: Skin is warm and dry.  Psychiatric: He has a normal mood and affect. His behavior is normal. Judgment and thought content normal.   Right Knee Exam   Range of Motion  Extension: 10  Flexion: 110   Tests   Valgus: positive  Other  Pulse: present Swelling: mild Other tests: effusion present      Imaging Review Plain radiographs demonstrate moderate degenerative joint disease of the right knee. The overall alignment is significant  varus. The bone quality appears to be good for age and reported activity level.  ASSESSMENT: End stage arthritis, right knee  Past Medical History:  Diagnosis Date  . Allergy   . Hypertension   . Seasonal allergies   1 PLAN: Plan for right total knee replacement.  The patient history, physical examination and imaging studies are consistent with moderate degenerative joint disease of the right knee. The patient has failed conservative treatment.  The clearance notes were reviewed.  After discussion with the patient it was felt that Total Knee Replacement was indicated. The procedure,  risks, and benefits of total knee arthroplasty were presented and reviewed. The risks including but not limited to aseptic loosening, infection, blood clots, vascular and nerve injury, stiffness, patella tracking  problems and fracture complications among others were discussed. The patient acknowledged the explanation, agreed to proceed with total knee replacement.  Biagio Borg 06/11/2016, 3:37 PM

## 2016-06-13 ENCOUNTER — Telehealth (INDEPENDENT_AMBULATORY_CARE_PROVIDER_SITE_OTHER): Payer: Self-pay | Admitting: Orthopaedic Surgery

## 2016-06-13 NOTE — Telephone Encounter (Signed)
LVM with wife about auth from [facility]. [employer] stated that we only get the approval for date of surgery. Hospital sends in clinicals and medical necessity for inpatient stay once pt is admitted.

## 2016-06-16 ENCOUNTER — Encounter (HOSPITAL_COMMUNITY): Payer: Self-pay

## 2016-06-17 ENCOUNTER — Encounter (HOSPITAL_COMMUNITY)
Admission: RE | Admit: 2016-06-17 | Discharge: 2016-06-17 | Disposition: A | Discharge status: Home / Self Care | Payer: 59 | Source: Ambulatory Visit | Attending: Orthopaedic Surgery | Admitting: Orthopaedic Surgery

## 2016-06-17 ENCOUNTER — Ambulatory Visit (HOSPITAL_COMMUNITY)
Admission: RE | Admit: 2016-06-17 | Discharge: 2016-06-17 | Disposition: A | Discharge status: Home / Self Care | Payer: 59 | Source: Ambulatory Visit | Attending: Orthopedic Surgery | Admitting: Orthopedic Surgery

## 2016-06-17 ENCOUNTER — Encounter (HOSPITAL_COMMUNITY): Payer: Self-pay

## 2016-06-17 DIAGNOSIS — Z01818 Encounter for other preprocedural examination: Secondary | ICD-10-CM

## 2016-06-17 DIAGNOSIS — M1711 Unilateral primary osteoarthritis, right knee: Secondary | ICD-10-CM | POA: Insufficient documentation

## 2016-06-17 DIAGNOSIS — Z01812 Encounter for preprocedural laboratory examination: Secondary | ICD-10-CM | POA: Diagnosis not present

## 2016-06-17 DIAGNOSIS — Z01810 Encounter for preprocedural cardiovascular examination: Secondary | ICD-10-CM | POA: Diagnosis not present

## 2016-06-17 HISTORY — DX: Unspecified osteoarthritis, unspecified site: M19.90

## 2016-06-17 HISTORY — DX: Gastro-esophageal reflux disease without esophagitis: K21.9

## 2016-06-17 HISTORY — DX: Cardiac murmur, unspecified: R01.1

## 2016-06-17 LAB — COMPREHENSIVE METABOLIC PANEL
ALT: 43 U/L (ref 17–63)
AST: 44 U/L — ABNORMAL HIGH (ref 15–41)
Albumin: 4.2 g/dL (ref 3.5–5.0)
Alkaline Phosphatase: 107 U/L (ref 38–126)
Anion gap: 8 (ref 5–15)
BUN: <5 mg/dL — ABNORMAL LOW (ref 6–20)
CO2: 26 mmol/L (ref 22–32)
Calcium: 9.4 mg/dL (ref 8.9–10.3)
Chloride: 104 mmol/L (ref 101–111)
Creatinine, Ser: 0.76 mg/dL (ref 0.61–1.24)
GFR calc Af Amer: >60 mL/min (ref >60)
GFR calc non Af Amer: >60 mL/min (ref >60)
Glucose, Bld: 105 mg/dL — ABNORMAL HIGH (ref 65–99)
Potassium: 4.5 mmol/L (ref 3.5–5.1)
Sodium: 138 mmol/L (ref 135–145)
Total Bilirubin: 0.4 mg/dL (ref 0.3–1.2)
Total Protein: 6.9 g/dL (ref 6.5–8.1)

## 2016-06-17 LAB — CBC WITH DIFFERENTIAL/PLATELET
Basophils Absolute: 0.1 10*3/uL (ref 0.0–0.1)
Basophils Relative: 2 %
Eosinophils Absolute: 0.3 10*3/uL (ref 0.0–0.7)
Eosinophils Relative: 7 %
HCT: 40.9 % (ref 39.0–52.0)
Hemoglobin: 14.3 g/dL (ref 13.0–17.0)
Lymphocytes Relative: 44 %
Lymphs Abs: 1.6 10*3/uL (ref 0.7–4.0)
MCH: 33.6 pg (ref 26.0–34.0)
MCHC: 35 g/dL (ref 30.0–36.0)
MCV: 96.2 fL (ref 78.0–100.0)
Monocytes Absolute: 0.3 10*3/uL (ref 0.1–1.0)
Monocytes Relative: 8 %
Neutro Abs: 1.5 10*3/uL — ABNORMAL LOW (ref 1.7–7.7)
Neutrophils Relative %: 39 %
Platelets: 156 10*3/uL (ref 150–400)
RBC: 4.25 MIL/uL (ref 4.22–5.81)
RDW: 12.3 % (ref 11.5–15.5)
WBC: 3.8 10*3/uL — ABNORMAL LOW (ref 4.0–10.5)

## 2016-06-17 LAB — TYPE AND SCREEN
ABO/RH(D): A NEG
Antibody Screen: NEGATIVE

## 2016-06-17 LAB — PROTIME-INR
INR: 0.93
Prothrombin Time: 12.5 seconds (ref 11.4–15.2)

## 2016-06-17 LAB — APTT: aPTT: 31 seconds (ref 24–36)

## 2016-06-17 LAB — ABO/RH: ABO/RH(D): A NEG

## 2016-06-17 LAB — SURGICAL PCR SCREEN
MRSA, PCR: NEGATIVE
Staphylococcus aureus: NEGATIVE

## 2016-06-17 IMAGING — CR DG CHEST 2V
2 series · 2 of 2 positions shown · non-contrast
Comparison: [DATE], [DATE] and [DATE].

CLINICAL DATA: 55-year-old male preop knee replacement chest x-ray.
Hypertension. Seasonal allergies. No chest complaints. Initial
encounter.

EXAM:
CHEST  2 VIEW

[w chest pa]
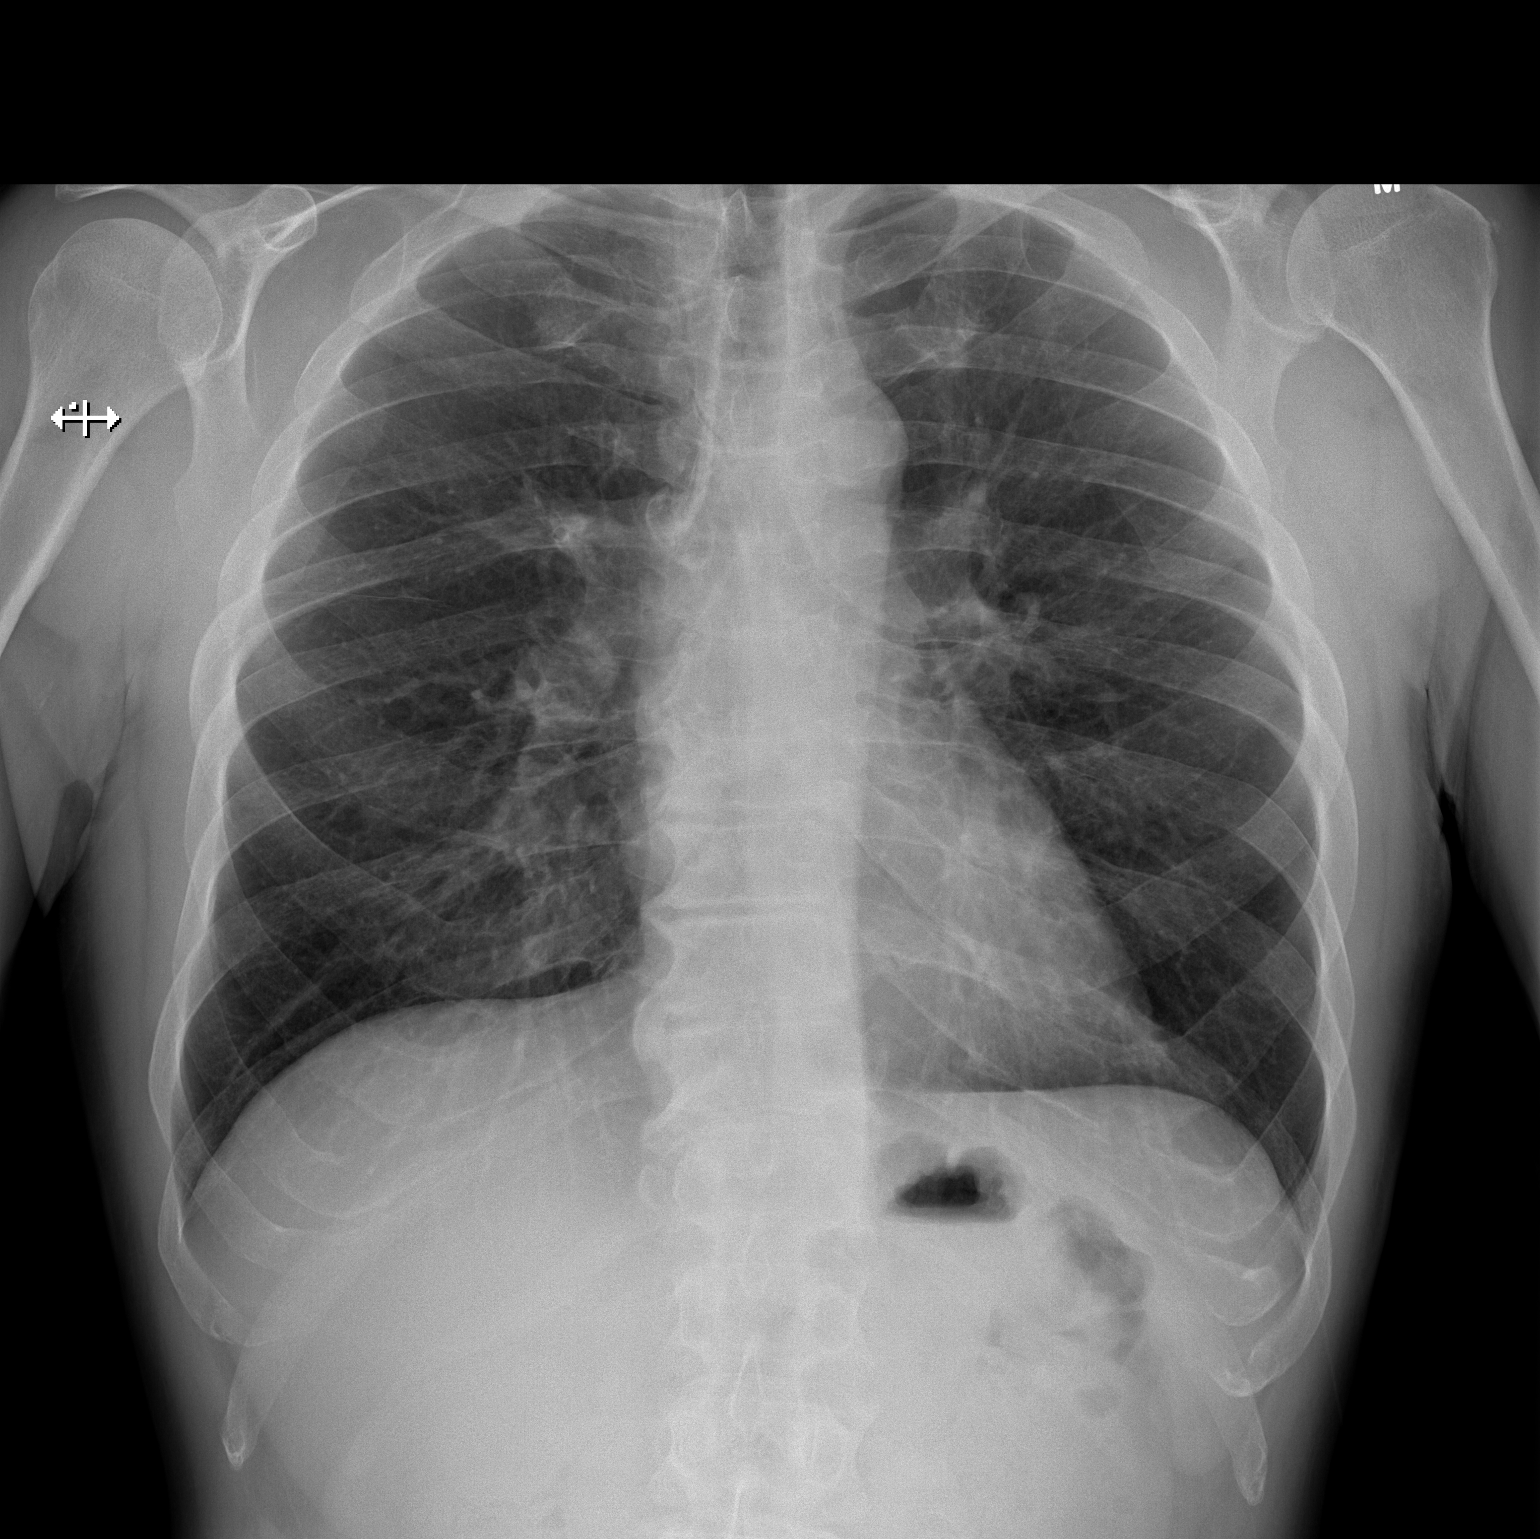

[w chest lat]
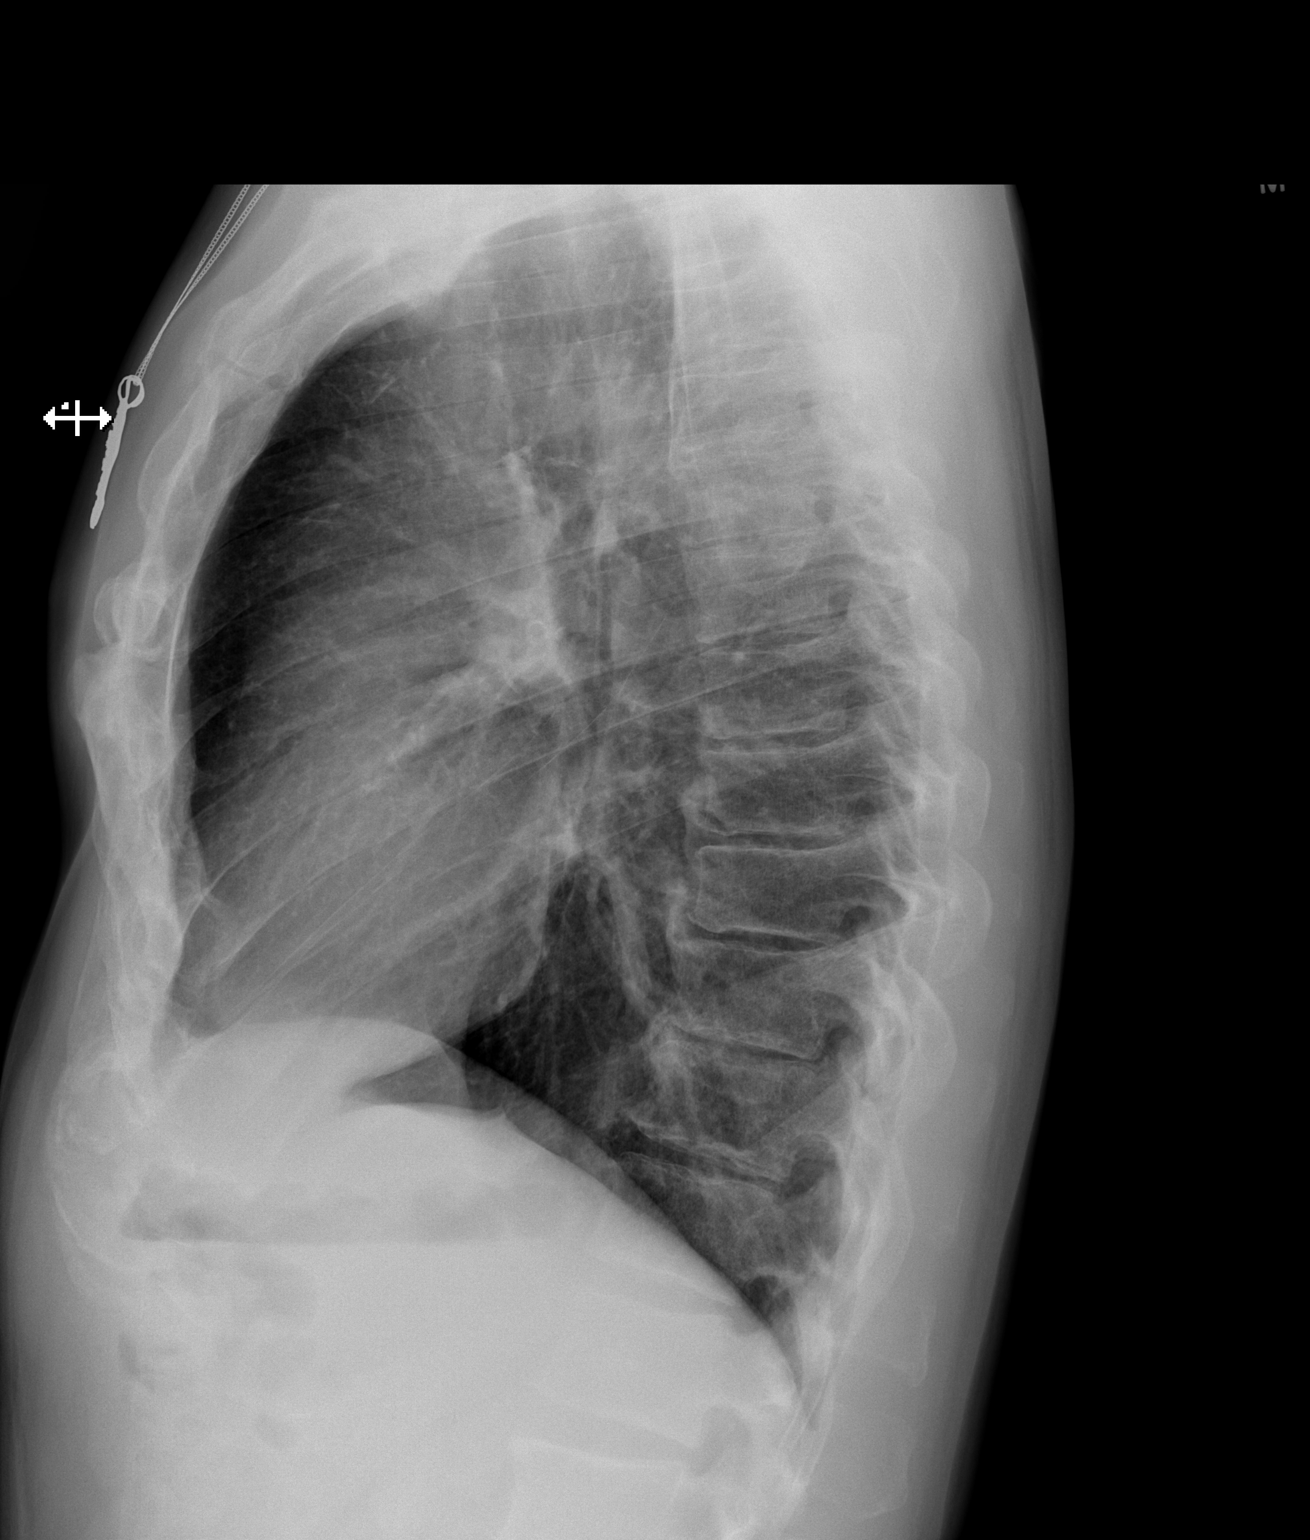

[2 of 2 positions shown; findings below may reference images not displayed]

FINDINGS: Minimal peribronchial thickening stable and chronic in appearance.

No infiltrate, congestive heart failure or pneumothorax.

No plain film evidence of pulmonary malignancy.

Suggestion of mild aortic arch calcifications.

Mild scoliosis thoracic spine with superimposed degenerative
changes. Left acromioclavicular joint degenerative changes.
IMPRESSION: No acute pulmonary abnormality.

Aortic atherosclerosis suspected.

## 2016-06-17 NOTE — Pre-Procedure Instructions (Addendum)
Leeam Sandau Sells  06/17/2016      [facility] [place], [address] [place] [address] Phone: [phone] Fax: [phone]    Your procedure is scheduled on :Tuesday, February 6th   Report to [facility] Admitting at 5:30 AM.             (posted surgery time 7:15 - 9:20 AM)   Call this number if you have problems the MORNING of surgery:  [phone].  Short Stay Unit is not open on weekends.   Remember:  Do not eat food or drink liquids after midnight Monday.   Take these medicines the morning of surgery with A SIP OF WATER :bupropion( Wellbutrin SR)              7 days prior to surgery, STOP taking any vitamins, herbal supplements, anti-inflammatories.including aspirin,ibuprofen,aleve ect   Do not wear jewelry - no rings or watches.  Do not wear lotions, colognes or deoderant.             Men may shave face and neck.   Do not bring valuables to the hospital.  [facility] is not responsible for any belongings or valuables.  Contacts, dentures or bridgework may not be worn into surgery.  Leave your suitcase in the car.  After surgery it may be brought to your room. For patients admitted to the hospital, discharge time will be determined by your treatment team.  Please read over the  fact sheets that you were given.    Special Instructions: [facility] - Preparing for Surgery  Before surgery, you can play an important role.  Because skin is not sterile, your skin needs to be as free of germs as possible.  You can reduce the number of germs on you skin by washing with CHG (chlorahexidine gluconate) soap before surgery.  CHG is an antiseptic cleaner which kills germs and bonds with the skin to continue killing germs even after washing.  Please DO NOT use if you have an allergy to CHG or antibacterial soaps.  If your skin becomes reddened/irritated stop using the CHG and inform your  nurse when you arrive at Short Stay.  Do not shave (including legs and underarms) for at least 48 hours prior to the first CHG shower.  You may shave your face.  Please follow these instructions carefully:   1.  Shower with CHG Soap the night before surgery and the morning of Surgery.  2.  If you choose to wash your hair, wash your hair first as usual with your normal shampoo.  3.  After you shampoo, rinse your hair and body thoroughly to remove the Shampoo.  4.  Use CHG as you would any other liquid soap.  You can apply chg directly  to the skin and wash gently with scrungie or a clean washcloth.  5.  Apply the CHG Soap to your body ONLY FROM THE NECK DOWN.  Do not use on open wounds or open sores.  Avoid contact with your eyes ears, mouth and genitals (private parts).  Wash genitals (private parts)       with your normal soap.  6.  Wash thoroughly, paying special attention to the area where your surgery will be performed.  7.  Thoroughly rinse your body with warm water from the neck down.  8.  DO NOT shower/wash with your normal soap  after using and rinsing off the CHG Soap.  9.  Pat yourself dry with a clean towel.            10.  Wear clean pajamas.            11.  Place clean sheets on your bed the night of your first shower and do not sleep with pets.  Day of Surgery  Do not apply any lotions/deodorants the morning of surgery.  Please wear clean clothes to the hospital/surgery center.

## 2016-06-18 LAB — URINE CULTURE: Culture: NO GROWTH

## 2016-06-18 NOTE — Progress Notes (Addendum)
Anesthesia Chart Review:  Pt is a 56 year old male scheduled for a right total knee arthroplasty on 06/24/2016 with Joni Fears, M.D.  - PCP is Antony Contras, MD  PMH includes:  HTN, heart murmur (as a baby), GERD. Former smoker. BMI 23.  BP 110/76   Pulse (!) 109   Temp 36.5 C   Resp 18   Ht 5\' 8"  (1.727 m)   Wt 152 lb 4.8 oz (69.1 kg)   SpO2 98%   BMI 23.16 kg/m    - Pt reported at PAT his HR is always 90s-100s. Review of records in Epic and PCP notes confirm this.   Preoperative labs reviewed.  CXR 06/17/16:  - No acute pulmonary abnormality. - Aortic atherosclerosis suspected.  Echo 06/17/16: Sinus tachycardia (104 bpm).  If no changes, I anticipate pt can proceed with surgery as scheduled.   Willeen Cass, FNP-BC [facility] Short Stay Surgical Center/Anesthesiology Phone: [phone] 06/18/2016 1:46 PM

## 2016-06-23 MED ORDER — TRANEXAMIC ACID 1000 MG/10ML IV SOLN
2000.0000 mg | INTRAVENOUS | Status: AC
  Administered 2016-06-24: 2000 mg via TOPICAL
  Filled 2016-06-23: qty 20

## 2016-06-24 ENCOUNTER — Inpatient Hospital Stay (HOSPITAL_COMMUNITY)
Admission: RE | Admit: 2016-06-24 | Discharge: 2016-06-26 | DRG: 470 | Disposition: A | Discharge status: Home Health | Payer: 59 | Source: Ambulatory Visit | Attending: Orthopaedic Surgery | Admitting: Orthopaedic Surgery

## 2016-06-24 ENCOUNTER — Encounter (HOSPITAL_COMMUNITY)
Admission: RE | Disposition: A | Discharge status: Home Health | Payer: Self-pay | Source: Ambulatory Visit | Attending: Orthopaedic Surgery

## 2016-06-24 ENCOUNTER — Inpatient Hospital Stay (HOSPITAL_COMMUNITY): Payer: 59 | Admitting: Vascular Surgery

## 2016-06-24 ENCOUNTER — Encounter (HOSPITAL_COMMUNITY): Payer: Self-pay | Admitting: Urology

## 2016-06-24 ENCOUNTER — Inpatient Hospital Stay (HOSPITAL_COMMUNITY): Payer: 59 | Admitting: Certified Registered"

## 2016-06-24 DIAGNOSIS — M1711 Unilateral primary osteoarthritis, right knee: Principal | ICD-10-CM | POA: Diagnosis present

## 2016-06-24 DIAGNOSIS — K219 Gastro-esophageal reflux disease without esophagitis: Secondary | ICD-10-CM | POA: Diagnosis present

## 2016-06-24 DIAGNOSIS — Z885 Allergy status to narcotic agent status: Secondary | ICD-10-CM

## 2016-06-24 DIAGNOSIS — Z96651 Presence of right artificial knee joint: Secondary | ICD-10-CM

## 2016-06-24 DIAGNOSIS — Z91041 Radiographic dye allergy status: Secondary | ICD-10-CM | POA: Diagnosis not present

## 2016-06-24 DIAGNOSIS — Z7982 Long term (current) use of aspirin: Secondary | ICD-10-CM | POA: Diagnosis not present

## 2016-06-24 DIAGNOSIS — I10 Essential (primary) hypertension: Secondary | ICD-10-CM | POA: Diagnosis present

## 2016-06-24 DIAGNOSIS — Z79899 Other long term (current) drug therapy: Secondary | ICD-10-CM

## 2016-06-24 DIAGNOSIS — D62 Acute posthemorrhagic anemia: Secondary | ICD-10-CM | POA: Diagnosis not present

## 2016-06-24 DIAGNOSIS — Z87891 Personal history of nicotine dependence: Secondary | ICD-10-CM | POA: Diagnosis not present

## 2016-06-24 DIAGNOSIS — E785 Hyperlipidemia, unspecified: Secondary | ICD-10-CM | POA: Diagnosis not present

## 2016-06-24 HISTORY — PX: TOTAL KNEE ARTHROPLASTY: SHX125

## 2016-06-24 SURGERY — ARTHROPLASTY, KNEE, TOTAL
Anesthesia: Monitor Anesthesia Care | Site: Knee | Laterality: Right

## 2016-06-24 MED ORDER — TETRAHYDROZOLINE HCL 0.05 % OP SOLN: 1.0000 [drp] | Freq: Every day | OPHTHALMIC | Status: DC

## 2016-06-24 MED ORDER — 0.9 % SODIUM CHLORIDE (POUR BTL) OPTIME
TOPICAL | Status: DC | PRN
  Administered 2016-06-24: 1000 mL

## 2016-06-24 MED ORDER — BUPIVACAINE HCL (PF) 0.25 % IJ SOLN
INTRAMUSCULAR | Status: DC | PRN
  Administered 2016-06-24: 30 mL

## 2016-06-24 MED ORDER — ACETAMINOPHEN 10 MG/ML IV SOLN
1000.0000 mg | Freq: Once | INTRAVENOUS | Status: AC
  Administered 2016-06-24: 1000 mg via INTRAVENOUS

## 2016-06-24 MED ORDER — SODIUM CHLORIDE 0.9 % IV SOLN
INTRAVENOUS | Status: DC
  Administered 2016-06-24: 17:00:00 via INTRAVENOUS

## 2016-06-24 MED ORDER — NAPHAZOLINE-GLYCERIN 0.012-0.2 % OP SOLN
1.0000 [drp] | Freq: Every day | OPHTHALMIC | Status: DC
  Administered 2016-06-25 – 2016-06-26 (×2): 1 [drp] via OPHTHALMIC
  Filled 2016-06-24: qty 15

## 2016-06-24 MED ORDER — LIDOCAINE HCL (CARDIAC) 20 MG/ML IV SOLN
INTRAVENOUS | Status: DC | PRN
  Administered 2016-06-24: 100 mg via INTRATRACHEAL

## 2016-06-24 MED ORDER — PROPOFOL 10 MG/ML IV BOLUS
INTRAVENOUS | Status: DC | PRN
  Administered 2016-06-24: 70 mg via INTRAVENOUS
  Administered 2016-06-24: 50 mg via INTRAVENOUS

## 2016-06-24 MED ORDER — METHOCARBAMOL 500 MG PO TABS
ORAL_TABLET | ORAL | Status: AC
  Filled 2016-06-24: qty 1

## 2016-06-24 MED ORDER — FENTANYL CITRATE (PF) 100 MCG/2ML IJ SOLN
INTRAMUSCULAR | Status: AC
  Filled 2016-06-24: qty 4

## 2016-06-24 MED ORDER — SODIUM CHLORIDE 0.9 % IV SOLN: INTRAVENOUS | Status: DC

## 2016-06-24 MED ORDER — METOCLOPRAMIDE HCL 5 MG PO TABS: 5.0000 mg | ORAL_TABLET | Freq: Three times a day (TID) | ORAL | Status: DC | PRN

## 2016-06-24 MED ORDER — OXYCODONE HCL 5 MG PO TABS: 5.0000 mg | ORAL_TABLET | Freq: Once | ORAL | Status: DC | PRN

## 2016-06-24 MED ORDER — SODIUM CHLORIDE 0.9 % IR SOLN
Status: DC | PRN
  Administered 2016-06-24: 3000 mL

## 2016-06-24 MED ORDER — METHOCARBAMOL 1000 MG/10ML IJ SOLN
500.0000 mg | Freq: Four times a day (QID) | INTRAVENOUS | Status: DC | PRN
  Filled 2016-06-24: qty 5

## 2016-06-24 MED ORDER — OXYCODONE HCL 5 MG PO TABS
5.0000 mg | ORAL_TABLET | ORAL | Status: DC | PRN
  Administered 2016-06-24 – 2016-06-26 (×11): 10 mg via ORAL
  Filled 2016-06-24 (×11): qty 2

## 2016-06-24 MED ORDER — FENTANYL CITRATE (PF) 100 MCG/2ML IJ SOLN
INTRAMUSCULAR | Status: AC
  Filled 2016-06-24: qty 2

## 2016-06-24 MED ORDER — ONDANSETRON HCL 4 MG PO TABS: 4.0000 mg | ORAL_TABLET | Freq: Four times a day (QID) | ORAL | Status: DC | PRN

## 2016-06-24 MED ORDER — BISACODYL 10 MG RE SUPP: 10.0000 mg | Freq: Every day | RECTAL | Status: DC | PRN

## 2016-06-24 MED ORDER — MIDAZOLAM HCL 2 MG/2ML IJ SOLN
INTRAMUSCULAR | Status: AC
Start: 2016-06-24 — End: 2016-06-24
  Filled 2016-06-24: qty 2

## 2016-06-24 MED ORDER — ACETAMINOPHEN 10 MG/ML IV SOLN
1000.0000 mg | Freq: Four times a day (QID) | INTRAVENOUS | Status: AC
  Administered 2016-06-24 – 2016-06-25 (×4): 1000 mg via INTRAVENOUS
  Filled 2016-06-24 (×4): qty 100

## 2016-06-24 MED ORDER — OXYCODONE HCL 5 MG/5ML PO SOLN: 5.0000 mg | Freq: Once | ORAL | Status: DC | PRN

## 2016-06-24 MED ORDER — ACETAMINOPHEN 10 MG/ML IV SOLN
INTRAVENOUS | Status: AC
  Filled 2016-06-24: qty 100

## 2016-06-24 MED ORDER — MIDAZOLAM HCL 5 MG/5ML IJ SOLN
INTRAMUSCULAR | Status: DC | PRN
  Administered 2016-06-24: 2 mg via INTRAVENOUS

## 2016-06-24 MED ORDER — DOCUSATE SODIUM 100 MG PO CAPS
100.0000 mg | ORAL_CAPSULE | Freq: Two times a day (BID) | ORAL | Status: DC
  Administered 2016-06-24 – 2016-06-26 (×4): 100 mg via ORAL
  Filled 2016-06-24 (×4): qty 1

## 2016-06-24 MED ORDER — METOCLOPRAMIDE HCL 5 MG/ML IJ SOLN: 5.0000 mg | Freq: Three times a day (TID) | INTRAMUSCULAR | Status: DC | PRN

## 2016-06-24 MED ORDER — LIDOCAINE 2% (20 MG/ML) 5 ML SYRINGE
INTRAMUSCULAR | Status: AC
  Filled 2016-06-24: qty 5

## 2016-06-24 MED ORDER — CHLORHEXIDINE GLUCONATE 4 % EX LIQD: 60.0000 mL | Freq: Once | CUTANEOUS | Status: DC

## 2016-06-24 MED ORDER — POLYETHYLENE GLYCOL 3350 17 G PO PACK: 17.0000 g | PACK | Freq: Every day | ORAL | Status: DC | PRN

## 2016-06-24 MED ORDER — HYDROMORPHONE HCL 1 MG/ML IJ SOLN: 0.2500 mg | INTRAMUSCULAR | Status: DC | PRN

## 2016-06-24 MED ORDER — PROPOFOL 1000 MG/100ML IV EMUL
INTRAVENOUS | Status: AC
Start: 2016-06-24 — End: 2016-06-24
  Filled 2016-06-24: qty 200

## 2016-06-24 MED ORDER — OXYCODONE HCL 5 MG PO TABS
ORAL_TABLET | ORAL | Status: AC
  Filled 2016-06-24: qty 2

## 2016-06-24 MED ORDER — VANCOMYCIN HCL IN DEXTROSE 1-5 GM/200ML-% IV SOLN
1000.0000 mg | Freq: Two times a day (BID) | INTRAVENOUS | Status: AC
  Administered 2016-06-24: 1000 mg via INTRAVENOUS
  Filled 2016-06-24: qty 200

## 2016-06-24 MED ORDER — PROPOFOL 10 MG/ML IV BOLUS
INTRAVENOUS | Status: AC
  Filled 2016-06-24: qty 20

## 2016-06-24 MED ORDER — LACTATED RINGERS IV SOLN
INTRAVENOUS | Status: DC | PRN
  Administered 2016-06-24 (×3): via INTRAVENOUS

## 2016-06-24 MED ORDER — IRBESARTAN 300 MG PO TABS
300.0000 mg | ORAL_TABLET | Freq: Every day | ORAL | Status: DC
  Administered 2016-06-24 – 2016-06-25 (×2): 300 mg via ORAL
  Filled 2016-06-24 (×2): qty 1

## 2016-06-24 MED ORDER — FENTANYL CITRATE (PF) 100 MCG/2ML IJ SOLN
INTRAMUSCULAR | Status: DC | PRN
  Administered 2016-06-24 (×5): 50 ug via INTRAVENOUS

## 2016-06-24 MED ORDER — PHENOL 1.4 % MT LIQD: 1.0000 | OROMUCOSAL | Status: DC | PRN

## 2016-06-24 MED ORDER — MENTHOL 3 MG MT LOZG: 1.0000 | LOZENGE | OROMUCOSAL | Status: DC | PRN

## 2016-06-24 MED ORDER — BUPIVACAINE IN DEXTROSE 0.75-8.25 % IT SOLN
INTRATHECAL | Status: DC | PRN
  Administered 2016-06-24: 2 mL via INTRATHECAL

## 2016-06-24 MED ORDER — PHENYLEPHRINE 40 MCG/ML (10ML) SYRINGE FOR IV PUSH (FOR BLOOD PRESSURE SUPPORT)
PREFILLED_SYRINGE | INTRAVENOUS | Status: AC
  Filled 2016-06-24: qty 10

## 2016-06-24 MED ORDER — RIVAROXABAN 10 MG PO TABS
10.0000 mg | ORAL_TABLET | Freq: Every day | ORAL | Status: DC
  Administered 2016-06-25 – 2016-06-26 (×2): 10 mg via ORAL
  Filled 2016-06-24 (×2): qty 1

## 2016-06-24 MED ORDER — MAGNESIUM CITRATE PO SOLN: 1.0000 | Freq: Once | ORAL | Status: DC | PRN

## 2016-06-24 MED ORDER — METHOCARBAMOL 500 MG PO TABS
500.0000 mg | ORAL_TABLET | Freq: Four times a day (QID) | ORAL | Status: DC | PRN
  Administered 2016-06-24 – 2016-06-26 (×6): 500 mg via ORAL
  Filled 2016-06-24 (×5): qty 1

## 2016-06-24 MED ORDER — PROPOFOL 500 MG/50ML IV EMUL
INTRAVENOUS | Status: DC | PRN
  Administered 2016-06-24: 100 ug/kg/min via INTRAVENOUS

## 2016-06-24 MED ORDER — ONDANSETRON HCL 4 MG/2ML IJ SOLN
4.0000 mg | Freq: Four times a day (QID) | INTRAMUSCULAR | Status: DC | PRN
  Administered 2016-06-24 – 2016-06-26 (×3): 4 mg via INTRAVENOUS
  Filled 2016-06-24 (×4): qty 2

## 2016-06-24 MED ORDER — PHENYLEPHRINE HCL 10 MG/ML IJ SOLN
INTRAMUSCULAR | Status: DC | PRN
  Administered 2016-06-24 (×3): 80 ug via INTRAVENOUS
  Administered 2016-06-24: 120 ug via INTRAVENOUS

## 2016-06-24 MED ORDER — BUPROPION HCL ER (SR) 150 MG PO TB12
150.0000 mg | ORAL_TABLET | Freq: Two times a day (BID) | ORAL | Status: DC
  Administered 2016-06-24 – 2016-06-26 (×4): 150 mg via ORAL
  Filled 2016-06-24 (×4): qty 1

## 2016-06-24 MED ORDER — BUPIVACAINE HCL (PF) 0.25 % IJ SOLN
INTRAMUSCULAR | Status: AC
Start: 2016-06-24 — End: 2016-06-24
  Filled 2016-06-24: qty 30

## 2016-06-24 MED ORDER — ALUM & MAG HYDROXIDE-SIMETH 200-200-20 MG/5ML PO SUSP: 30.0000 mL | ORAL | Status: DC | PRN

## 2016-06-24 MED ORDER — KETOROLAC TROMETHAMINE 15 MG/ML IJ SOLN
15.0000 mg | Freq: Four times a day (QID) | INTRAMUSCULAR | Status: AC
  Administered 2016-06-24 – 2016-06-25 (×4): 15 mg via INTRAVENOUS
  Filled 2016-06-24 (×4): qty 1

## 2016-06-24 MED ORDER — VANCOMYCIN HCL IN DEXTROSE 1-5 GM/200ML-% IV SOLN
1000.0000 mg | INTRAVENOUS | Status: AC
  Administered 2016-06-24: 1000 mg via INTRAVENOUS

## 2016-06-24 MED ORDER — HYDROMORPHONE HCL 2 MG/ML IJ SOLN
0.5000 mg | INTRAMUSCULAR | Status: DC | PRN
  Administered 2016-06-24 – 2016-06-26 (×4): 0.5 mg via INTRAVENOUS
  Filled 2016-06-24 (×6): qty 1

## 2016-06-24 MED ORDER — ROCURONIUM BROMIDE 50 MG/5ML IV SOSY
PREFILLED_SYRINGE | INTRAVENOUS | Status: AC
  Filled 2016-06-24: qty 5

## 2016-06-24 MED ORDER — VANCOMYCIN HCL IN DEXTROSE 1-5 GM/200ML-% IV SOLN
INTRAVENOUS | Status: AC
  Filled 2016-06-24: qty 200

## 2016-06-24 MED ORDER — DIPHENHYDRAMINE HCL 12.5 MG/5ML PO ELIX: 12.5000 mg | ORAL_SOLUTION | ORAL | Status: DC | PRN

## 2016-06-24 SURGICAL SUPPLY — 64 items
BAG DECANTER FOR FLEXI CONT (MISCELLANEOUS) ×2 IMPLANT
BANDAGE ESMARK 6X9 LF (GAUZE/BANDAGES/DRESSINGS) ×1 IMPLANT
BLADE SAGITTAL 25.0X1.19X90 (BLADE) ×2 IMPLANT
BNDG CMPR 9X6 STRL LF SNTH (GAUZE/BANDAGES/DRESSINGS) ×1
BNDG ESMARK 6X9 LF (GAUZE/BANDAGES/DRESSINGS) ×2
BOWL SMART MIX CTS (DISPOSABLE) ×2 IMPLANT
CAP KNEE TOTAL 3 SIGMA ×2 IMPLANT
CEMENT HV SMART SET (Cement) ×4 IMPLANT
CHLORAPREP W/TINT 26ML (MISCELLANEOUS) ×4 IMPLANT
COVER SURGICAL LIGHT HANDLE (MISCELLANEOUS) ×2 IMPLANT
CUFF TOURNIQUET SINGLE 34IN LL (TOURNIQUET CUFF) ×2 IMPLANT
CUFF TOURNIQUET SINGLE 44IN (TOURNIQUET CUFF) IMPLANT
DECANTER SPIKE VIAL GLASS SM (MISCELLANEOUS) ×2 IMPLANT
DRAPE EXTREMITY T 121X128X90 (DRAPE) IMPLANT
DRAPE HALF SHEET 40X57 (DRAPES) ×2 IMPLANT
DRAPE PROXIMA HALF (DRAPES) ×2 IMPLANT
DRSG ADAPTIC 3X8 NADH LF (GAUZE/BANDAGES/DRESSINGS) IMPLANT
DRSG AQUACEL AG ADV 3.5X10 (GAUZE/BANDAGES/DRESSINGS) ×2 IMPLANT
DRSG MEPILEX BORDER 4X4 (GAUZE/BANDAGES/DRESSINGS) ×2 IMPLANT
DRSG PAD ABDOMINAL 8X10 ST (GAUZE/BANDAGES/DRESSINGS) IMPLANT
DURAPREP 26ML APPLICATOR (WOUND CARE) IMPLANT
ELECT CAUTERY BLADE 6.4 (BLADE) ×2 IMPLANT
ELECT REM PT RETURN 9FT ADLT (ELECTROSURGICAL) ×2
ELECTRODE REM PT RTRN 9FT ADLT (ELECTROSURGICAL) ×1 IMPLANT
EVACUATOR 1/8 PVC DRAIN (DRAIN) ×2 IMPLANT
FACESHIELD WRAPAROUND (MASK) ×4 IMPLANT
GAUZE SPONGE 4X4 12PLY STRL (GAUZE/BANDAGES/DRESSINGS) ×2 IMPLANT
GLOVE BIOGEL PI IND STRL 8 (GLOVE) ×1 IMPLANT
GLOVE BIOGEL PI IND STRL 8.5 (GLOVE) ×1 IMPLANT
GLOVE BIOGEL PI INDICATOR 8 (GLOVE) ×1
GLOVE BIOGEL PI INDICATOR 8.5 (GLOVE) ×1
GLOVE ECLIPSE 8.0 STRL XLNG CF (GLOVE) ×4 IMPLANT
GLOVE SURG ORTHO 8.5 STRL (GLOVE) ×8 IMPLANT
GOWN STRL REUS W/ TWL LRG LVL3 (GOWN DISPOSABLE) ×2 IMPLANT
GOWN STRL REUS W/TWL 2XL LVL3 (GOWN DISPOSABLE) ×2 IMPLANT
GOWN STRL REUS W/TWL LRG LVL3 (GOWN DISPOSABLE) ×4
HANDPIECE INTERPULSE COAX TIP (DISPOSABLE) ×2
KIT BASIN OR (CUSTOM PROCEDURE TRAY) ×2 IMPLANT
KIT ROOM TURNOVER OR (KITS) ×2 IMPLANT
MANIFOLD NEPTUNE II (INSTRUMENTS) ×2 IMPLANT
NEEDLE 22X1 1/2 (OR ONLY) (NEEDLE) ×2 IMPLANT
NS IRRIG 1000ML POUR BTL (IV SOLUTION) ×2 IMPLANT
PACK TOTAL JOINT (CUSTOM PROCEDURE TRAY) ×2 IMPLANT
PAD ARMBOARD 7.5X6 YLW CONV (MISCELLANEOUS) ×4 IMPLANT
PAD CAST 4YDX4 CTTN HI CHSV (CAST SUPPLIES) IMPLANT
PADDING CAST COTTON 4X4 STRL (CAST SUPPLIES)
PADDING CAST COTTON 6X4 STRL (CAST SUPPLIES) IMPLANT
SET HNDPC FAN SPRY TIP SCT (DISPOSABLE) ×1 IMPLANT
STAPLER VISISTAT 35W (STAPLE) IMPLANT
SUCTION FRAZIER HANDLE 10FR (MISCELLANEOUS) ×1
SUCTION TUBE FRAZIER 10FR DISP (MISCELLANEOUS) ×1 IMPLANT
SURGIFLO W/THROMBIN 8M KIT (HEMOSTASIS) IMPLANT
SUT BONE WAX W31G (SUTURE) ×2 IMPLANT
SUT ETHIBOND NAB CT1 #1 30IN (SUTURE) ×4 IMPLANT
SUT MNCRL AB 3-0 PS2 18 (SUTURE) ×4 IMPLANT
SUT VIC AB 0 CT1 27 (SUTURE) ×2
SUT VIC AB 0 CT1 27XBRD ANBCTR (SUTURE) ×1 IMPLANT
SYR CONTROL 10ML LL (SYRINGE) ×2 IMPLANT
TOWEL OR 17X24 6PK STRL BLUE (TOWEL DISPOSABLE) ×2 IMPLANT
TOWEL OR 17X26 10 PK STRL BLUE (TOWEL DISPOSABLE) ×2 IMPLANT
TRAY CATH 16FR W/PLASTIC CATH (SET/KITS/TRAYS/PACK) ×2 IMPLANT
TRAY FOLEY BAG SILVER LF 16FR (SET/KITS/TRAYS/PACK) IMPLANT
WRAP KNEE MAXI GEL POST OP (GAUZE/BANDAGES/DRESSINGS) ×2 IMPLANT
YANKAUER SUCT BULB TIP NO VENT (SUCTIONS) ×2 IMPLANT

## 2016-06-24 NOTE — H&P (Signed)
The recent History & Physical has been reviewed. I have personally examined the patient today. There is no interval change to the documented History & Physical. The patient would like to proceed with the procedure.  Garald Balding 06/24/2016,  7:09 AM

## 2016-06-24 NOTE — Anesthesia Procedure Notes (Addendum)
Spinal  Patient location during procedure: OR Start time: 06/24/2016 7:19 AM End time: 06/24/2016 7:26 AM Staffing Anesthesiologist: Oleta Mouse Preanesthetic Checklist Completed: patient identified, surgical consent, pre-op evaluation, timeout performed, IV checked, risks and benefits discussed and monitors and equipment checked Spinal Block Patient position: sitting Prep: site prepped and draped and DuraPrep Patient monitoring: heart rate, cardiac monitor, continuous pulse ox and blood pressure Approach: midline Location: L4-5 Injection technique: single-shot Needle Needle type: Pencan  Needle gauge: 24 G Needle length: 10 cm Assessment Sensory level: T6

## 2016-06-24 NOTE — Anesthesia Procedure Notes (Signed)
Procedure Name: MAC Date/Time: 06/24/2016 7:26 AM Performed by: Lance Coon Pre-anesthesia Checklist: Emergency Drugs available, Patient identified, Suction available, Patient being monitored and Timeout performed Patient Re-evaluated:Patient Re-evaluated prior to inductionOxygen Delivery Method: Nasal cannula

## 2016-06-24 NOTE — Anesthesia Preprocedure Evaluation (Signed)
Anesthesia Evaluation  Patient identified by MRN, date of birth, ID band Patient awake    Reviewed: Allergy & Precautions, NPO status , Patient's Chart, lab work & pertinent test results  History of Anesthesia Complications Negative for: history of anesthetic complications  Airway Mallampati: I  TM Distance: >3 FB Neck ROM: Full    Dental  (+) Teeth Intact   Pulmonary neg shortness of breath, neg COPD, neg recent URI, former smoker,    breath sounds clear to auscultation       Cardiovascular hypertension, Pt. on medications  Rhythm:Regular     Neuro/Psych negative neurological ROS  negative psych ROS   GI/Hepatic Neg liver ROS, GERD  ,  Endo/Other  negative endocrine ROS  Renal/GU negative Renal ROS     Musculoskeletal  (+) Arthritis ,   Abdominal   Peds  Hematology negative hematology ROS (+)   Anesthesia Other Findings   Reproductive/Obstetrics                             Anesthesia Physical Anesthesia Plan  ASA: II  Anesthesia Plan: Spinal, Regional and MAC   Post-op Pain Management:    Induction: Intravenous  Airway Management Planned: Nasal Cannula, Natural Airway and Simple Face Mask  Additional Equipment: None  Intra-op Plan:   Post-operative Plan:   Informed Consent: I have reviewed the patients History and Physical, chart, labs and discussed the procedure including the risks, benefits and alternatives for the proposed anesthesia with the patient or authorized representative who has indicated his/her understanding and acceptance.   Dental advisory given  Plan Discussed with: CRNA and Surgeon  Anesthesia Plan Comments:         Anesthesia Quick Evaluation

## 2016-06-24 NOTE — Transfer of Care (Signed)
Immediate Anesthesia Transfer of Care Note  Patient: Philip Winters  Procedure(s) Performed: Procedure(s): TOTAL KNEE ARTHROPLASTY (Right)  Patient Location: PACU  Anesthesia Type:MAC and Spinal  Level of Consciousness: awake, alert  and patient cooperative  Airway & Oxygen Therapy: Patient Spontanous Breathing  Post-op Assessment: Report given to RN and Post -op Vital signs reviewed and stable  Post vital signs: Reviewed and stable  Last Vitals:  Vitals:   06/24/16 0556 06/24/16 0614  BP: (!) 159/102 (!) 157/97  Pulse: 100 98  Resp: 18   Temp: 36.8 C     Last Pain:  Vitals:   06/24/16 0556  TempSrc: Oral  PainSc:          Complications: No apparent anesthesia complications

## 2016-06-24 NOTE — Anesthesia Procedure Notes (Addendum)
Anesthesia Regional Block:  Femoral nerve block  Pre-Anesthetic Checklist: ,, timeout performed, Correct Patient, Correct Site, Correct Laterality, Correct Procedure, Correct Position, site marked, Risks and benefits discussed,  Surgical consent,  Pre-op evaluation,  At surgeon's request and post-op pain management  Laterality: Lower and Right  Prep: chloraprep       Needles:  Injection technique: Single-shot  Needle Type: Echogenic Stimulator Needle          Additional Needles:  Procedures: ultrasound guided (picture in chart) Femoral nerve block Narrative:  Start time: 06/24/2016 10:11 AM End time: 06/24/2016 10:17 AM Injection made incrementally with aspirations every 5 mL.  Performed by: Personally  Anesthesiologist: Deanna Wiater  Additional Notes: H+P and labs reviewed, risks and benefits discussed with patient, procedure tolerated well without complications

## 2016-06-24 NOTE — Op Note (Signed)
Philip Winters, TURNEY NO.:  [id number]  MEDICAL RECORD NO.:  [id number]  LOCATION:                                 FACILITY:  PHYSICIAN:  Vonna Kotyk. Whitfield, M.D.DATE OF BIRTH:  1961-02-25  DATE OF PROCEDURE:  06/24/2016 DATE OF DISCHARGE:                              OPERATIVE REPORT   PREOPERATIVE DIAGNOSIS:  End-stage osteoarthritis, right knee.  POSTOPERATIVE DIAGNOSIS:  End-stage osteoarthritis, right knee.  PROCEDURE:  Right total knee replacement.  SURGEON:  Vonna Kotyk. Durward Fortes, M.D.  ASSISTANT:  Aaron Edelman D. Petrarca, PA-C.  ANESTHESIA:  Spinal with IV sedation and adductor canal block.  COMPLICATIONS:  None.  COMPONENTS:  DePuy LCS standard plus femoral component, a 10 mm polyethylene bridging bearing, a #4 rotating keeled tibial tray, a metal- backed 3-peg rotating patella.  All components were secured with polymethyl methacrylate.  DESCRIPTION OF PROCEDURE:  Philip Winters was met with his wife in the holding area, identified the right knee as appropriate operative site and marked it accordingly.  Any questions were answered.  The patient was then transported to room #9.  Spinal anesthesia was performed by Anesthesia without difficulty.  IV sedation was also administered.  The right lower extremity was then placed in a thigh tourniquet.  The leg was then prepped with chlorhexidine scrub and non-iodine solution x2 from the tourniquet to the tips of the toes.  Sterile draping was performed.  Time-out was called.  The right lower extremity was then elevated and Esmarch exsanguinated with a proximal tourniquet at 350 mmHg.  A midline longitudinal incision was made centered about the patella extending from the superior pouch to the tibial tubercle.  Via sharp dissection, incision was carried down to subcutaneous tissue.  First layer of capsule was incised in the midline.  A medial parapatellar incision was made with Bovie through the deep capsule.  The  joint was entered.  There was minimal clear yellow joint effusion.  The patella was everted 180 degrees laterally and the knee flexed to 90 degrees. There were moderately large osteophytes along the medial and lateral femoral condyle, little if any articular cartilage remaining on the medial femoral condyle and medial tibial plateau.  There were large osteophytes along the medial tibial plateau, which were removed with a rongeur.  Osteophytes were removed from the femur, so I could size a standard plus femoral component.  The patient did have a fixed flexion contracture and varus deformity preoperatively.  We did perform a medial release subperiosteally, so that we could realign the knee to neutral position.  First bony cut was then made transversely on the proximal tibia with a 7- degree angle of declination.  After each bony cut on the tibia and the femur, we checked the external alignment with the external guide. Subsequent cuts were then made on the femur using the standard plus femoral jig.  Flexion and extension gaps were symmetrical at 10 mm.  We did use a 4-degree distal femoral valgus cut.  Lamina spreaders were then inserted along the medial and lateral compartment.  I removed medial and lateral menisci, ACL and PCL and any large osteophytes in the posterior femoral  condyle with a 3/4-inch curved osteotome.  There was 1 small loose body about 5 x 5 mm identified in the midline capsule posteriorly.  There was a minimal Baker's cyst that was identified and the obvious lining was removed with the Bovie.  We again checked our flexion and extension gaps at 10 mm.  Final cut was then made on the femur for the tapering cuts and for the center hole.  MCL and LCL remained intact throughout the procedure.  Retractors were then placed about the tibia, was advanced anteriorly, and measured a #4 tibial tray.  The alignment guide was applied and then we pinned it in place.  A center hole  was made followed by the keeled cut.  With the tibial jig in place, the 10-mm polyethylene bridging bearing was applied, followed by the standard plus femoral component. The entire construct was reduced.  We had full extension and no opening with varus or valgus stress, and reestablished normal alignment.  The patella was prepared by removing approximately 11.5 mm of bone leaving 15 mm of patellar thickness.  The patella jig was applied, 3 holes made and the trial patella inserted through a full range of motion and remained perfectly stable.  Trial components were removed.  The joint was copiously irrigated with saline solution.  Final components were then impacted with polymethyl methacrylate.  We initially applied the #4 tibial tray, followed by the 10-mm polyethylene bridging bearing and the standard plus femoral component.  These impacted in place with nice alignment.  Any extraneous methacrylate was removed from the periphery of the components.  The patella was applied with methacrylate and patellar clamp.  At approximately 16 minutes, the methacrylate had matured, during which time, we irrigated the joint and injected the deep capsule with 0.25% Marcaine with epinephrine.  Tourniquet was deflated at 77 minutes.  We had nice capillary bleeding to the joint.  Any gross bleeding was controlled with the Bovie.  We did apply topical tranexamic acid for over 5 minutes with compression.  We had a nice dry field.  A medium- size Hemovac was placed in the lateral compartment.  The deep capsule was then closed with a running #1 Ethibond, superficial capsule with a running 0 Vicryl, subcu with 3-0 Monocryl.  Skin closed with skin clips. Sterile bulky dressing was applied followed by the patient's support stocking.  Anesthesia did perform an adductor canal block after finishing the procedure.  The patient tolerated the procedure well without complications.    Vonna Kotyk. Durward Fortes,  M.D.   ______________________________ Vonna Kotyk. Durward Fortes, M.D.   PWW/MEDQ  D:  06/24/2016  T:  06/24/2016  Job:  [id number]

## 2016-06-24 NOTE — Progress Notes (Signed)
Orthopedic Tech Progress Note Patient Details:  Philip Winters 1960-12-19 [id number]  CPM Right Knee CPM Right Knee: On Right Knee Flexion (Degrees): 90 Right Knee Extension (Degrees): 0 Additional Comments: Trapeze bar   [place] Pink 06/24/2016, 6:34 PM

## 2016-06-24 NOTE — Anesthesia Postprocedure Evaluation (Addendum)
Anesthesia Post Note  Patient: Philip Winters  Procedure(s) Performed: Procedure(s) (LRB): TOTAL KNEE ARTHROPLASTY (Right)  Patient location during evaluation: PACU Anesthesia Type: Regional Level of consciousness: awake and alert Pain management: pain level controlled Vital Signs Assessment: post-procedure vital signs reviewed and stable Respiratory status: spontaneous breathing, nonlabored ventilation, respiratory function stable and patient connected to nasal cannula oxygen Cardiovascular status: stable and blood pressure returned to baseline Postop Assessment: spinal receding Anesthetic complications: no       Last Vitals:  Vitals:   06/24/16 1045 06/24/16 1100  BP: (!) 144/95 (!) 133/93  Pulse:    Resp:    Temp:      Last Pain:  Vitals:   06/24/16 1045  TempSrc:   PainSc: 0-No pain            L Sensory Level: L2-Upper inner thigh, upper buttock (06/24/16 1100) R Sensory Level: L2-Upper inner thigh, upper buttock (06/24/16 1100)  Kiaraliz Rafuse

## 2016-06-24 NOTE — Op Note (Signed)
PATIENT ID:      Philip Winters  MRN:     [id number] DOB/AGE:    12-26-1960 / 56 y.o.       OPERATIVE REPORT    DATE OF PROCEDURE:  06/24/2016       PREOPERATIVE DIAGNOSIS:END STAGE   RIGHT OSTEOARTHRITIS OF THE KNEE                                                       Estimated body mass index is 23.11 kg/m as calculated from the following:   Height as of 06/17/16: 5\' 8"  (1.727 m).   Weight as of this encounter: 152 lb (68.9 kg).     POSTOPERATIVE DIAGNOSIS:END STAGE   RIGHT OSTEOARTHRITIS OF THE KNEE                                                                     Estimated body mass index is 23.11 kg/m as calculated from the following:   Height as of 06/17/16: 5\' 8"  (1.727 m).   Weight as of this encounter: 152 lb (68.9 kg).     PROCEDURE:  Procedure(s):RIGHTTOTAL KNEE ARTHROPLASTY     SURGEON:  Joni Fears, MD    ASSISTANT:   Biagio Borg, PA-C   (Present and scrubbed throughout the case, critical for assistance with exposure, retraction, instrumentation, and closure.)          ANESTHESIA: regional, spinal and IV sedation     DRAINS: HEMOVAC DRAIN RIGHT KNEE CLAMPED     TOURNIQUET TIME:  Total Tourniquet Time Documented: area (laterality) - 77 minutes Total: area (laterality) - 77 minutes     COMPLICATIONS:  None   CONDITION:  stable  PROCEDURE IN DETAIL: [place] 06/24/2016, 9:18 AM

## 2016-06-24 NOTE — Progress Notes (Signed)
Orthopedic Tech Progress Note Patient Details:  Philip Winters 24-Apr-1961 [id number]  CPM Right Knee CPM Right Knee: On Right Knee Flexion (Degrees): 90 Right Knee Extension (Degrees): 0 Additional Comments: Trapeze bar   [place] Pink 06/24/2016, 10:28 AM

## 2016-06-25 ENCOUNTER — Encounter (HOSPITAL_COMMUNITY): Payer: Self-pay | Admitting: *Deleted

## 2016-06-25 LAB — CBC
HCT: 29.1 % — ABNORMAL LOW (ref 39.0–52.0)
Hemoglobin: 10.1 g/dL — ABNORMAL LOW (ref 13.0–17.0)
MCH: 33.4 pg (ref 26.0–34.0)
MCHC: 34.7 g/dL (ref 30.0–36.0)
MCV: 96.4 fL (ref 78.0–100.0)
Platelets: 144 10*3/uL — ABNORMAL LOW (ref 150–400)
RBC: 3.02 MIL/uL — ABNORMAL LOW (ref 4.22–5.81)
RDW: 12.1 % (ref 11.5–15.5)
WBC: 6.6 10*3/uL (ref 4.0–10.5)

## 2016-06-25 LAB — BASIC METABOLIC PANEL
Anion gap: 10 (ref 5–15)
BUN: 5 mg/dL — ABNORMAL LOW (ref 6–20)
CO2: 28 mmol/L (ref 22–32)
Calcium: 9.1 mg/dL (ref 8.9–10.3)
Chloride: 96 mmol/L — ABNORMAL LOW (ref 101–111)
Creatinine, Ser: 0.73 mg/dL (ref 0.61–1.24)
GFR calc Af Amer: >60 mL/min (ref >60)
GFR calc non Af Amer: >60 mL/min (ref >60)
Glucose, Bld: 118 mg/dL — ABNORMAL HIGH (ref 65–99)
Potassium: 4.1 mmol/L (ref 3.5–5.1)
Sodium: 134 mmol/L — ABNORMAL LOW (ref 135–145)

## 2016-06-25 MED ORDER — METOCLOPRAMIDE HCL 5 MG PO TABS
5.0000 mg | ORAL_TABLET | Freq: Three times a day (TID) | ORAL | Status: DC
  Administered 2016-06-25 – 2016-06-26 (×5): 10 mg via ORAL
  Filled 2016-06-25 (×5): qty 2

## 2016-06-25 NOTE — Discharge Instructions (Signed)

## 2016-06-25 NOTE — Evaluation (Signed)
Occupational Therapy Evaluation and Discharge Patient Details Name: Philip Winters MRN: [id number] DOB: 1960-07-01 Today's Date: 06/25/2016    History of Present Illness Pt is a 56 y/o male presenting post op Right TKA. Pt has a past medical history including Arthritis and Hypertension. Pt has a past surgical history that includes Knee arthroscopy [id number]); Carpal tunnel with cubital tunnel (Left, 11/10/2013); Trigger finger release (Left, 11/10/2013); Ulnar nerve transposition (Left, 11/10/2013); and Nasal sinus surgery (05/2015).   Clinical Impression   PTA Pt very independent in ADL and mobility (used a SPC PRN). Pt currently mod A for ADL and min A for mobility with RW (gait belt essential as Pt has frequent LOB). Pt has tremors because he quit smoking 8 weeks ago. Pt able to demonstrate tub transfer with 3 in 1 and provided with handout. Pt displaying impulsivity during session and requested to "slow down" by therapy several times during session, wife says that is Pt at baseline. Pt and wife provided all necessary education and they had no questions at the end of session. OT to sign off at this point. Thank you for the referral.     Follow Up Recommendations  No OT follow up;Supervision - Intermittent    Equipment Recommendations  3 in 1 bedside commode    Recommendations for Other Services       Precautions / Restrictions Precautions Precautions: Knee Precaution Booklet Issued: No Precaution Comments: reviewed no pillow/ice packs under knee Restrictions Weight Bearing Restrictions: Yes RLE Weight Bearing: Partial weight bearing RLE Partial Weight Bearing Percentage or Pounds: 50%      Mobility Bed Mobility               General bed mobility comments: Pt OOB with PT when OT arrived  Transfers Overall transfer level: Needs assistance Equipment used: Rolling walker (2 wheeled) Transfers: Sit to/from Stand Sit to Stand: Min assist         General transfer  comment: mod verbal cues for safe hand placement and use of gait belt to assist with balance    Balance Overall balance assessment: Needs assistance Sitting-balance support: No upper extremity supported;Feet supported Sitting balance-Leahy Scale: Good     Standing balance support: Bilateral upper extremity supported;During functional activity Standing balance-Leahy Scale: Poor Standing balance comment: requires min A with gait belt due to LOB when in standing                            ADL Overall ADL's : Needs assistance/impaired     Grooming: Wash/dry face;Oral care;Standing;Minimal assistance Grooming Details (indicate cue type and reason): sink level, gait belt essential Upper Body Bathing: Set up;Sitting   Lower Body Bathing: Moderate assistance;Sitting/lateral leans   Upper Body Dressing : Set up;Sitting   Lower Body Dressing: Moderate assistance;With caregiver independent assisting;Sit to/from stand Lower Body Dressing Details (indicate cue type and reason): Pt and wife already donned underwear before OT arrived, educated on operated leg first for dressing Toilet Transfer: Minimal assistance;Ambulation;BSC;RW;With caregiver independent assisting;Cueing for safety (BSC over toilet) Toilet Transfer Details (indicate cue type and reason): vc for safe hand placement and slow down Toileting- Clothing Manipulation and Hygiene: Minimal assistance;Sit to/from stand   Tub/ Shower Transfer: Minimal assistance;Cueing for sequencing;Anterior/posterior;3 in Mudlogger Details (indicate cue type and reason): performed in ortho gym with wife Present Functional mobility during ADLs: Minimal assistance;Cueing for safety;Rolling walker General ADL Comments: Pt very impulsive and "go getter" attitude,  vc many times to slow down for safety. Pt with several LOB during static standing and required use of gait belt (easy correct)     Vision Vision  Assessment?: No apparent visual deficits   Perception     Praxis      Pertinent Vitals/Pain Pain Assessment: 0-10 Pain Score: 7  Pain Location: R knee Pain Descriptors / Indicators: Constant;Discomfort;Sore Pain Intervention(s): Monitored during session;Repositioned;Ice applied     Hand Dominance Right   Extremity/Trunk Assessment Upper Extremity Assessment Upper Extremity Assessment: Overall WFL for tasks assessed   Lower Extremity Assessment Lower Extremity Assessment: RLE deficits/detail RLE Deficits / Details: post op deficits in strength and ROM   Cervical / Trunk Assessment Cervical / Trunk Assessment: Normal   Communication Communication Communication: No difficulties   Cognition Arousal/Alertness: Awake/alert Behavior During Therapy: WFL for tasks assessed/performed;Impulsive Overall Cognitive Status: Within Functional Limits for tasks assessed                 General Comments: Pt is very much a "go getter" and needs vc to slow down for safety. Pt also quit smoking 8 weeks ago and has "the shakes"   General Comments       Exercises       Shoulder Instructions      Home Living Family/patient expects to be discharged to:: Private residence Living Arrangements: Spouse/significant other Available Help at Discharge: Family;Available PRN/intermittently Type of Home: House Home Access: Stairs to enter Entrance Stairs-Number of Steps: 1 +1 Entrance Stairs-Rails: Left Home Layout: One level     Bathroom Shower/Tub: Tub/shower unit Shower/tub characteristics: Curtain Biochemist, clinical: Handicapped height Bathroom Accessibility: Yes How Accessible: Accessible via walker Home Equipment: [place] - 2 wheels;Cane - single point;Hand held shower head          Prior Functioning/Environment Level of Independence: Independent        Comments: Full time as plumber        OT Problem List: Decreased strength;Decreased range of motion;Decreased activity  tolerance;Impaired balance (sitting and/or standing);Decreased knowledge of use of DME or AE;Decreased knowledge of precautions;Pain   OT Treatment/Interventions:      OT Goals(Current goals can be found in the care plan section) Acute Rehab OT Goals Patient Stated Goal: to get back to walking his dog at [facility] OT Goal Formulation: With patient/family Time For Goal Achievement: 07/09/16 Potential to Achieve Goals: Good  OT Frequency:     Barriers to D/C:            Co-evaluation PT/OT/SLP Co-Evaluation/Treatment: Yes Reason for Co-Treatment: For patient/therapist safety;To address functional/ADL transfers PT goals addressed during session: Mobility/safety with mobility OT goals addressed during session: ADL's and self-care      End of Session Equipment Utilized During Treatment: Gait belt;Rolling walker CPM Right Knee CPM Right Knee: Off Nurse Communication: Mobility status;Precautions;Weight bearing status  Activity Tolerance: Patient tolerated treatment well Patient left: in chair;with call bell/phone within reach;with family/visitor present   Time: 0830-0909 OT Time Calculation (min): 39 min Charges:  OT General Charges $OT Visit: 1 Procedure OT Evaluation $OT Eval Moderate Complexity: 1 Procedure G-Codes:    Merri Ray Lecia Esperanza 06-27-16, 10:04 AM  Hulda Humphrey OTR/L [phone]

## 2016-06-25 NOTE — Progress Notes (Signed)
Pt has not been able to void this shift after several attempts. Has ambulated to BR to stand to void with no success, has stood at bedside with urinal to void and unsuccessful.  Bladder scanned for 637. Straight cathd for 650 orange colored urine.

## 2016-06-25 NOTE — Progress Notes (Signed)
Pt was due to have Hemovac unclamped and recharged at 9:15pm. Went in to unclamp and recharge and drained 366ml. Charted amt and assessed drainage amt every 2hrs.

## 2016-06-25 NOTE — Op Note (Signed)
PATIENT ID: Philip Winters        MRN:  [id number]          DOB/AGE: June 16, 1960 / 56 y.o.    Joni Fears, MD   Biagio Borg, PA-C [address] [place], [place]  [address]                             [phone]   PROGRESS NOTE  Subjective:  negative for Chest Pain  negative for Shortness of Breath  positive for Nausea/Vomiting after IV dilaudid  negative for Calf Pain    Tolerating Diet: yes         Patient reports pain as mild.     Comfortable at present, eating breakfast  Objective: Vital signs in last 24 hours:   Patient Vitals for the past 24 hrs:  BP Temp Temp src Pulse Resp SpO2  06/25/16 0500 119/70 97.6 F (36.4 C) Oral (!) 105 19 98 %  06/24/16 2106 (!) 151/83 98 F (36.7 C) Oral (!) 111 19 97 %  06/24/16 1654 129/89 - - (!) 102 - 98 %  06/24/16 1630 135/87 - - 98 18 97 %  06/24/16 1345 124/88 - - - - -  06/24/16 1330 125/85 - - - - -  06/24/16 1315 (!) 133/98 - - - - -  06/24/16 1245 (!) 137/99 - - - - -  06/24/16 1230 (!) 145/91 - - - 20 -  06/24/16 1215 (!) 128/105 - - - - -  06/24/16 1200 (!) 132/110 - - - 20 -  06/24/16 1130 (!) 116/97 - - - - -  06/24/16 1100 (!) 133/93 - - - - -  06/24/16 1045 (!) 144/95 - - - - -  06/24/16 1030 (!) 146/98 - - - - -  06/24/16 1015 130/89 - - - - -  06/24/16 1000 (!) 142/99 97.2 F (36.2 C) - - 16 -      Intake/Output from previous day:   02/06 0701 - 02/07 0700 In: 2300 [I.V.:2300] Out: 1875 [Urine:650; Drains:1175]   Intake/Output this shift:   No intake/output data recorded.   Intake/Output      02/06 0701 - 02/07 0700 02/07 0701 - 02/08 0700   I.V. (mL/kg) 2300 (33.4)    Total Intake(mL/kg) 2300 (33.4)    Urine (mL/kg/hr) 650 (0.4)    Drains 1175 (0.7)    Blood 50 (0)    Total Output 1875     Net +425             LABORATORY DATA:  Recent Labs  06/25/16 0546  WBC 6.6  HGB 10.1*  HCT 29.1*  PLT 144*    Recent Labs  06/25/16 0546  NA 134*  K 4.1  CL 96*  CO2 28  BUN 5*    CREATININE 0.73  GLUCOSE 118*  CALCIUM 9.1   Lab Results  Component Value Date   INR 0.93 06/17/2016    Recent Radiographic Studies :  Dg Chest 2 View  Result Date: 06/17/2016 CLINICAL DATA:  56 year old male preop knee replacement chest x-ray. Hypertension. Seasonal allergies. No chest complaints. Initial encounter. EXAM: CHEST  2 VIEW COMPARISON:  01/06/2015, 09/13/2012 and 07/13/2006. FINDINGS: Minimal peribronchial thickening stable and chronic in appearance. No infiltrate, congestive heart failure or pneumothorax. No plain film evidence of pulmonary malignancy. Suggestion of mild aortic arch calcifications. Mild scoliosis thoracic spine with superimposed degenerative changes. Left acromioclavicular joint degenerative  changes. IMPRESSION: No acute pulmonary abnormality. Aortic atherosclerosis suspected. Electronically Signed   By: Genia Del M.D.   On: 06/17/2016 10:24     Examination:  General appearance: alert, cooperative and no distress  Wound Exam: clean, dry, intact   Drainage:  About 80cc's in hemovac-removed  Motor Exam: EHL, FHL, Anterior Tibial and Posterior Tibial Intact  Sensory Exam: Superficial Peroneal, Deep Peroneal and Tibial normal  Vascular Exam: Normal  Assessment:    1 Day Post-Op  Procedure(s) (LRB): TOTAL KNEE ARTHROPLASTY (Right)  ADDITIONAL DIAGNOSIS:  Active Problems:   Unilateral primary osteoarthritis, right knee   S/P total knee replacement using cement, right  Acute Blood Loss Anemia-asymptomatic   Plan: Physical Therapy as ordered Partial Weight Bearing @ 50% (PWB)  DVT Prophylaxis:  Partial Weight Bearing @ 50% (PWB)  DISCHARGE PLAN: Home  DISCHARGE NEEDS: HHPT, CPM, Walker and 3-in-1 comode seat  OOB with PT,I/O cath prn, saline lock IV, monitor lab      Garald Balding  06/25/2016 7:36 AM

## 2016-06-25 NOTE — Progress Notes (Signed)
Physical Therapy Treatment Patient Details Name: Philip Winters MRN: [id number] DOB: 06-07-1960 Today's Date: 06/25/2016    History of Present Illness Pt is a 56 y/o male presenting post op Right TKA. Pt has a past medical history including Arthritis and Hypertension. Pt has a past surgical history that includes Knee arthroscopy [id number]); Carpal tunnel with cubital tunnel (Left, 11/10/2013); Trigger finger release (Left, 11/10/2013); Ulnar nerve transposition (Left, 11/10/2013); and Nasal sinus surgery (05/2015).    PT Comments    Pt intentionally working on slowing down with mobility this afternoon and was able to ambulate 200' with RW and min A with no LOB. He does continue to be very shaky and easily distracted by environment. Had just performed exercises when PT arrived and instructed to do them one more time late afternoon. PT will continue to follow.   Follow Up Recommendations  Home health PT;Supervision - Intermittent     Equipment Recommendations  None recommended by PT    Recommendations for Other Services       Precautions / Restrictions Precautions Precautions: Knee Precaution Booklet Issued: No Precaution Comments: pt able to verbalize proper positioning and use of CPM Restrictions Weight Bearing Restrictions: Yes RLE Weight Bearing: Partial weight bearing RLE Partial Weight Bearing Percentage or Pounds: 50%    Mobility  Bed Mobility Overal bed mobility: Independent             General bed mobility comments: was able to get to EOB safely, cues for sitting safely  Transfers Overall transfer level: Needs assistance Equipment used: Rolling walker (2 wheeled) Transfers: Sit to/from Stand Sit to Stand: Min guard         General transfer comment: pt showed more care with sit to stand in afternoon, no LOB, safe hand placement  Ambulation/Gait Ambulation/Gait assistance: Min assist Ambulation Distance (Feet): 200 Feet Assistive device: Rolling walker (2  wheeled) Gait Pattern/deviations: Step-through pattern;Antalgic;Decreased step length - right;Decreased weight shift to right   Gait velocity interpretation: Below normal speed for age/gender General Gait Details: pt able to verbalize WB status. No LOB. vc's for safety with changes of direction. easily distracted by environment   Stairs Stairs: Yes   Stair Management: One rail Left;Step to pattern;Forwards Number of Stairs: 2 General stair comments: Pt had one LOB on steps due to rushing and impulsiveness.  Pt needed cues to sequence steps and RW.    Wheelchair Mobility    Modified Rankin (Stroke Patients Only)       Balance Overall balance assessment: Needs assistance Sitting-balance support: No upper extremity supported;Feet supported Sitting balance-Leahy Scale: Good     Standing balance support: Bilateral upper extremity supported;During functional activity Standing balance-Leahy Scale: Poor Standing balance comment: min A for safety             High level balance activites: Direction changes;Turns;Sudden stops;Backward walking High Level Balance Comments: min assist and cues as pt unsteady due to impulsivity    Cognition Arousal/Alertness: Awake/alert Behavior During Therapy: WFL for tasks assessed/performed;Impulsive Overall Cognitive Status: Within Functional Limits for tasks assessed                 General Comments: pt verbalizes that he knows he needs to slow down and did demonstrate this with mobility but was still very shaky which seemed to affect his impulsivity    Exercises Total Joint Exercises Ankle Circles/Pumps: AROM;Both;10 reps;Supine Quad Sets: AROM;Both;10 reps;Supine Towel Squeeze: AROM;Both;10 reps;Supine Short Arc Quad: AROM;Right;10 reps;Supine Heel Slides: AROM;Right;10 reps;Supine Hip ABduction/ADduction: AROM;Right;10  reps;Supine Straight Leg Raises: AROM;Right;10 reps;Supine Long Arc Quad: AROM;Right;10 reps;Seated Knee  Flexion: AROM;Right;5 reps;Seated Goniometric ROM: 4-99    General Comments General comments (skin integrity, edema, etc.): Pt wife present and observing therapy.  Educated wife in pts care.       Pertinent Vitals/Pain Pain Assessment: 0-10 Pain Score: 7  Pain Location: right knee Pain Descriptors / Indicators: Constant;Sore Pain Intervention(s): Limited activity within patient's tolerance;Monitored during session    [place] expects to be discharged to:: Private residence Living Arrangements: Spouse/significant other Available Help at Discharge: Family;Available PRN/intermittently Type of Home: House Home Access: Stairs to enter Entrance Stairs-Rails: Left Home Layout: One level Home Equipment: Walker - 2 wheels;Cane - single point;Hand held shower head      Prior Function Level of Independence: Independent      Comments: Full time as plumber; plans to take 2 months off   PT Goals (current goals can now be found in the care plan section) Acute Rehab PT Goals Patient Stated Goal: to get back to walking his dog at [facility] PT Goal Formulation: With patient Time For Goal Achievement: 07/02/16 Potential to Achieve Goals: Good Progress towards PT goals: Progressing toward goals    Frequency    7X/week      PT Plan Current plan remains appropriate    Co-evaluation PT/OT/SLP Co-Evaluation/Treatment: Yes (partial session PT/OT) Reason for Co-Treatment: For patient/therapist safety PT goals addressed during session: Mobility/safety with mobility       End of Session Equipment Utilized During Treatment: Gait belt Activity Tolerance: Patient tolerated treatment well Patient left: in bed;with call bell/phone within reach     Time: 1208-1224 PT Time Calculation (min) (ACUTE ONLY): 16 min  Charges:  $Gait Training: 8-22 mins                    G Codes:     Leighton Roach, PT  Acute Rehab Services  [place] 06/25/2016,  1:33 PM

## 2016-06-25 NOTE — Progress Notes (Signed)
Pt went to restroom voided. Will cont. To monitor

## 2016-06-25 NOTE — Evaluation (Signed)
Physical Therapy Evaluation Patient Details Name: Philip Winters MRN: [id number] DOB: Sep 17, 1960 Today's Date: 06/25/2016   History of Present Illness  Pt is a 56 y/o male presenting post op Right TKA. Pt has a past medical history including Arthritis and Hypertension. Pt has a past surgical history that includes Knee arthroscopy [id number]); Carpal tunnel with cubital tunnel (Left, 11/10/2013); Trigger finger release (Left, 11/10/2013); Ulnar nerve transposition (Left, 11/10/2013); and Nasal sinus surgery (05/2015).  Clinical Impression  Pt admitted with above diagnosis. Pt currently with functional limitations due to the deficits listed below (see PT Problem List). Pt and wife educated regarding knee exercises and safety with ambulation.  Pt needs further instruction regarding safety with mobility and RW.  Wife nervous about assisting pt at home.  Will follow acutely.  Pt will benefit from skilled PT to increase their independence and safety with mobility to allow discharge to the venue listed below.      Follow Up Recommendations Home health PT;Supervision - Intermittent    Equipment Recommendations  None recommended by PT    Recommendations for Other Services       Precautions / Restrictions Precautions Precautions: Knee Precaution Booklet Issued: No Precaution Comments: reviewed no pillow/ice packs under knee Restrictions Weight Bearing Restrictions: Yes RLE Weight Bearing: Partial weight bearing RLE Partial Weight Bearing Percentage or Pounds: 50%      Mobility  Bed Mobility Overal bed mobility: Independent             General bed mobility comments: Pt needed cues to slow down as pt impulsive  Transfers Overall transfer level: Needs assistance Equipment used: Rolling walker (2 wheeled) Transfers: Sit to/from Stand Sit to Stand: Min guard;Min assist         General transfer comment: mod verbal cues for safe hand placement and use of gait belt to assist with  balance  Ambulation/Gait Ambulation/Gait assistance: Min assist;Mod assist;+2 safety/equipment Ambulation Distance (Feet): 150 Feet Assistive device: Rolling walker (2 wheeled) Gait Pattern/deviations: Step-to pattern;Decreased step length - right;Decreased stance time - right;Decreased weight shift to right;Antalgic;Staggering right;Staggering left   Gait velocity interpretation: Below normal speed for age/gender General Gait Details: Pt needing mod cues for safety as pt was impulsive and at times does not stay with RW decreasing safety.  Pt also needed cues to sequence steps and RW.  Pt with 2 LOB needing mod assist to steady pt due to impulsivity with pt not utilizing RW appropriately.    Stairs Stairs: Yes Stairs assistance: Min assist;+2 safety/equipment Stair Management: One rail Left;Step to pattern;Forwards Number of Stairs: 2 General stair comments: Pt had one LOB on steps due to rushing and impulsiveness.  Pt needed cues to sequence steps and RW.    Wheelchair Mobility    Modified Rankin (Stroke Patients Only)       Balance Overall balance assessment: Needs assistance Sitting-balance support: No upper extremity supported;Feet supported Sitting balance-Leahy Scale: Good     Standing balance support: Bilateral upper extremity supported;During functional activity Standing balance-Leahy Scale: Poor Standing balance comment: requires min A with gait belt due to LOB when in standing             High level balance activites: Direction changes;Turns;Sudden stops;Backward walking High Level Balance Comments: min assist and cues as pt unsteady due to impulsivity             Pertinent Vitals/Pain Pain Assessment: 0-10 Pain Score: 8  Pain Location: right knee Pain Descriptors / Indicators: Constant;Discomfort;Sore Pain Intervention(s): Limited  activity within patient's tolerance;Monitored during session;Premedicated before session;Repositioned  VSS    Home  Living Family/patient expects to be discharged to:: Private residence Living Arrangements: Spouse/significant other Available Help at Discharge: Family;Available PRN/intermittently Type of Home: House Home Access: Stairs to enter Entrance Stairs-Rails: Left Entrance Stairs-Number of Steps: 1 +1 Home Layout: One level Home Equipment: Walker - 2 wheels;Cane - single point;Hand held shower head      Prior Function Level of Independence: Independent         Comments: Full time as plumber; plans to take 2 months off     Hand Dominance   Dominant Hand: Right    Extremity/Trunk Assessment   Upper Extremity Assessment Upper Extremity Assessment: Defer to OT evaluation    Lower Extremity Assessment Lower Extremity Assessment: RLE deficits/detail RLE Deficits / Details: post op deficits in strength and ROM    Cervical / Trunk Assessment Cervical / Trunk Assessment: Normal  Communication   Communication: No difficulties  Cognition Arousal/Alertness: Awake/alert Behavior During Therapy: WFL for tasks assessed/performed;Impulsive Overall Cognitive Status: Within Functional Limits for tasks assessed                 General Comments: Pt is very much a "go getter" and needs vc to slow down for safety. Pt also quit smoking 8 weeks ago and has "the shakes"    General Comments General comments (skin integrity, edema, etc.): Pt wife present and observing therapy.  Educated wife in pts care.     Exercises Total Joint Exercises Ankle Circles/Pumps: AROM;Both;10 reps;Supine Quad Sets: AROM;Both;10 reps;Supine Towel Squeeze: AROM;Both;10 reps;Supine Short Arc Quad: AROM;Right;10 reps;Supine Heel Slides: AROM;Right;10 reps;Supine Hip ABduction/ADduction: AROM;Right;10 reps;Supine Straight Leg Raises: AROM;Right;10 reps;Supine Long Arc Quad: AROM;Right;10 reps;Seated Knee Flexion: AROM;Right;5 reps;Seated Goniometric ROM: 4-99 degrees   Assessment/Plan    PT Assessment  Patient needs continued PT services  PT Problem List Decreased activity tolerance;Decreased balance;Decreased strength;Decreased mobility;Decreased knowledge of use of DME;Decreased safety awareness;Decreased knowledge of precautions;Pain;Decreased cognition          PT Treatment Interventions DME instruction;Gait training;Stair training;Functional mobility training;Therapeutic activities;Therapeutic exercise;Balance training;Patient/family education    PT Goals (Current goals can be found in the Care Plan section)  Acute Rehab PT Goals Patient Stated Goal: to get back to walking his dog at [facility] PT Goal Formulation: With patient Time For Goal Achievement: 07/02/16 Potential to Achieve Goals: Good    Frequency 7X/week   Barriers to discharge        Co-evaluation PT/OT/SLP Co-Evaluation/Treatment: Yes (partial session PT/OT) Reason for Co-Treatment: For patient/therapist safety PT goals addressed during session: Mobility/safety with mobility OT goals addressed during session: ADL's and self-care       End of Session Equipment Utilized During Treatment: Gait belt Activity Tolerance: Patient limited by fatigue Patient left: in chair;with call bell/phone within reach;with family/visitor present Nurse Communication: Mobility status         Time: [id number] PT Time Calculation (min) (ACUTE ONLY): 31 min   Charges:   PT Evaluation $PT Eval Moderate Complexity: 1 Procedure PT Treatments $Gait Training: 8-22 mins   PT G Codes:        Denice Paradise 03-Jul-2016, 10:23 AM [facility] Acute Rehabilitation [phone] (pager)

## 2016-06-25 NOTE — Care Management Note (Signed)
Case Management Note  Patient Details  Name: Philip Winters MRN: [id number] Date of Birth: 1961/01/20  Subjective/Objective:   56 yr old gentleman s/p right total knee arthroplasty.                 Action/Plan: Case manager spoke with patient concerning [place] and DME needs. Patient was preoperatively setup with [facility], no changes. Patient states he has rolling walker and 3in1, has not received CPM. CM contacted Taren at Dr. Caryl Never office requesting that she followup with [facility] concerning getting CPM to patient. He will have family support at discharge.   Expected Discharge Date:   06/26/16               Expected Discharge Plan:  [place]  In-House Referral:  NA  Discharge planning Services  CM Consult  Post Acute Care Choice:  Durable Medical Equipment, Home Health Choice offered to:  Patient  DME Arranged:  CPM DME Agency:  [facility]  HH Arranged:  PT [place] Agency:  [facility] (formerly [employer])  Status of Service:  Completed, signed off  If discussed at [employer], dates discussed:    Additional Comments:  Ninfa Meeker, RN 06/25/2016, 12:11 PM

## 2016-06-26 LAB — CBC
HCT: 24.1 % — ABNORMAL LOW (ref 39.0–52.0)
Hemoglobin: 8.4 g/dL — ABNORMAL LOW (ref 13.0–17.0)
MCH: 33.3 pg (ref 26.0–34.0)
MCHC: 34.9 g/dL (ref 30.0–36.0)
MCV: 95.6 fL (ref 78.0–100.0)
Platelets: 123 10*3/uL — ABNORMAL LOW (ref 150–400)
RBC: 2.52 MIL/uL — ABNORMAL LOW (ref 4.22–5.81)
RDW: 11.8 % (ref 11.5–15.5)
WBC: 7.4 10*3/uL (ref 4.0–10.5)

## 2016-06-26 LAB — BASIC METABOLIC PANEL
Anion gap: 11 (ref 5–15)
BUN: <5 mg/dL — ABNORMAL LOW (ref 6–20)
CO2: 25 mmol/L (ref 22–32)
Calcium: 8.5 mg/dL — ABNORMAL LOW (ref 8.9–10.3)
Chloride: 91 mmol/L — ABNORMAL LOW (ref 101–111)
Creatinine, Ser: 0.62 mg/dL (ref 0.61–1.24)
GFR calc Af Amer: >60 mL/min (ref >60)
GFR calc non Af Amer: >60 mL/min (ref >60)
Glucose, Bld: 116 mg/dL — ABNORMAL HIGH (ref 65–99)
Potassium: 3.8 mmol/L (ref 3.5–5.1)
Sodium: 127 mmol/L — ABNORMAL LOW (ref 135–145)

## 2016-06-26 MED ORDER — METHOCARBAMOL 500 MG PO TABS: 500.0000 mg | ORAL_TABLET | Freq: Four times a day (QID) | ORAL | 0 refills | Status: DC | PRN

## 2016-06-26 MED ORDER — RIVAROXABAN 10 MG PO TABS: 10.0000 mg | ORAL_TABLET | Freq: Every day | ORAL | 0 refills | Status: DC

## 2016-06-26 MED ORDER — OXYCODONE HCL 5 MG PO TABS: 5.0000 mg | ORAL_TABLET | ORAL | 0 refills | Status: DC | PRN

## 2016-06-26 MED ORDER — ACETAMINOPHEN 500 MG PO TABS: 1000.0000 mg | ORAL_TABLET | Freq: Four times a day (QID) | ORAL | 0 refills | Status: DC | PRN

## 2016-06-26 NOTE — Progress Notes (Signed)
Physical Therapy Treatment Patient Details Name: Philip Winters MRN: [id number] DOB: 06-17-1960 Today's Date: 06/26/2016    History of Present Illness Pt is a 56 y/o male presenting post op Right TKA. Pt has a past medical history including Arthritis and Hypertension. Pt has a past surgical history that includes Knee arthroscopy [id number]); Carpal tunnel with cubital tunnel (Left, 11/10/2013); Trigger finger release (Left, 11/10/2013); Ulnar nerve transposition (Left, 11/10/2013); and Nasal sinus surgery (05/2015).    PT Comments    Pt is progressing well with mobility, however, still remains very quick to move and unsafe.  He needs near constant cues for safety and is a very high fall risk.  He is due to d/c home today and his wife is not currently here for education.    Follow Up Recommendations  Home health PT;Supervision for mobility/OOB     Equipment Recommendations  None recommended by PT    Recommendations for Other Services  NA     Precautions / Restrictions Precautions Precautions: Knee Restrictions RLE Weight Bearing: Partial weight bearing RLE Partial Weight Bearing Percentage or Pounds: 50%    Mobility  Bed Mobility Overal bed mobility: Independent             General bed mobility comments: He is able to get to EOB easily, however, the speed of his movement is unsafe and he sits on the very edge of the bed close to falling off.   Transfers Overall transfer level: Needs assistance Equipment used: Rolling walker (2 wheeled) Transfers: Sit to/from Stand Sit to Stand: Min guard         General transfer comment: Min guard assist for safety and balance due to fast speed of movement and being off balance clutching for and getting off balance during transition to RW.   Ambulation/Gait Ambulation/Gait assistance: Min assist Ambulation Distance (Feet):  (130) Assistive device: Rolling walker (2 wheeled) Gait Pattern/deviations: Step-through pattern;Antalgic Gait  velocity: too fast to be safe   General Gait Details: pt with moderately antalgic gait pattern, verbal cues to slow down, pt shaking near constantly during session.  Moving fast and quickly correcting RW trajectory to the point that he loses his balance in the process.  He is an extrememely high fall risk and needs near constant cues for safety during gait.    Stairs Stairs: Yes   Stair Management: One rail Left;Alternating pattern;Forwards Number of Stairs: 2 General stair comments: Verbal cues to use only the left railing, go slowly, step to pattern instead of reciprocal pattern, pt has no reguard for any cues for safety given.          Balance Overall balance assessment: Needs assistance Sitting-balance support: No upper extremity supported;Feet supported Sitting balance-Leahy Scale: Good     Standing balance support: Bilateral upper extremity supported Standing balance-Leahy Scale: Poor                      Cognition Arousal/Alertness: Awake/alert Behavior During Therapy: Impulsive Overall Cognitive Status: No family/caregiver present to determine baseline cognitive functioning                 General Comments: Pt is extremely impulsive with decreased safety awareness and awareness of deficits and how they apply to him being safe.     Exercises Total Joint Exercises Goniometric ROM: 8-95        Pertinent Vitals/Pain Pain Assessment: 0-10 Pain Score: 4  Pain Location: right knee Pain Descriptors / Indicators: Constant;Sore Pain Intervention(s): Limited  activity within patient's tolerance;Monitored during session;Repositioned           PT Goals (current goals can now be found in the care plan section) Acute Rehab PT Goals Patient Stated Goal: to get back to walking his dog at [place] towards PT goals: Progressing toward goals    Frequency    7X/week      PT Plan Current plan remains appropriate       End of Session  Equipment Utilized During Treatment: Gait belt Activity Tolerance: Patient tolerated treatment well Patient left: in bed;with call bell/phone within reach     Time: 0941-0952 PT Time Calculation (min) (ACUTE ONLY): 11 min  Charges:  $Gait Training: 8-22 mins                      Isys Tietje B. [place], [place], DPT [phone]   06/26/2016, 2:17 PM

## 2016-06-26 NOTE — Progress Notes (Signed)
Subjective: 2 Days Post-Op Procedure(s) (LRB): TOTAL KNEE ARTHROPLASTY (Right) Patient reports pain as mild and moderate.    Objective: Vital signs in last 24 hours: Temp:  [98.4 F (36.9 C)-98.6 F (37 C)] 98.6 F (37 C) (02/08 0436) Pulse Rate:  [98-117] 116 (02/08 0436) Resp:  [18] 18 (02/08 0436) BP: (108-130)/(63-76) 130/76 (02/08 0436) SpO2:  [95 %-98 %] 96 % (02/08 0436)  Intake/Output from previous day: 02/07 0701 - 02/08 0700 In: 836 [P.O.:836] Out: 650 [Urine:650] Intake/Output this shift: Total I/O In: 600 [P.O.:600] Out: -    Recent Labs  06/25/16 0546 06/26/16 0438  HGB 10.1* 8.4*    Recent Labs  06/25/16 0546 06/26/16 0438  WBC 6.6 7.4  RBC 3.02* 2.52*  HCT 29.1* 24.1*  PLT 144* 123*    Recent Labs  06/25/16 0546 06/26/16 0438  NA 134* 127*  K 4.1 3.8  CL 96* 91*  CO2 28 25  BUN 5* <5*  CREATININE 0.73 0.62  GLUCOSE 118* 116*  CALCIUM 9.1 8.5*   No results for input(s): LABPT, INR in the last 72 hours.  Neurologically intact Neurovascular intact Sensation intact distally Dorsiflexion/Plantar flexion intact Incision: no drainage  Assessment/Plan: 2 Days Post-Op Procedure(s) (LRB): TOTAL KNEE ARTHROPLASTY (Right) Discharge home with home health  Biagio Borg 06/26/2016, 12:22 PM

## 2016-06-26 NOTE — Progress Notes (Signed)
Orthopedic Tech Progress Note Patient Details:  Philip Winters 1961/01/18 [id number]  Patient ID: Philip Winters, male   DOB: 1960/05/21, 56 y.o.   MRN: [id number] Pt refused cpm.  Karolee Stamps 06/26/2016, 5:36 AM

## 2016-06-26 NOTE — Progress Notes (Signed)
Pt ready for discharge from unit to home. Wife to transport. Discharge instructions and rx reviewed with pt. Pt demonstrates no s/sx of distress of c/o. Follow up appt with Dr. Durward Fortes 2/19.

## 2016-06-26 NOTE — Discharge Summary (Signed)
Joni Fears, MD   Biagio Borg, PA-C [address] [place], [place]  [address]                             [phone]  PATIENT ID: Philip Winters        MRN:  [id number]          DOB/AGE: 57-26-1962 / 57 y.o.    DISCHARGE SUMMARY  ADMISSION DATE:    06/24/2016 DISCHARGE DATE:   06/26/2016   ADMISSION DIAGNOSIS: RIGHT OSTEOARTHRITIS OF THE KNEE    DISCHARGE DIAGNOSIS:  RIGHT OSTEOARTHRITIS OF THE KNEE    ADDITIONAL DIAGNOSIS: Active Problems:   Unilateral primary osteoarthritis, right knee   S/P total knee replacement using cement, right  Past Medical History:  Diagnosis Date  . Allergy   . Arthritis   . GERD (gastroesophageal reflux disease)    occ  . Heart murmur    baby  . Hypertension   . Seasonal allergies     PROCEDURE: Procedure(s): TOTAL KNEE ARTHROPLASTY Right on 06/24/2016  CONSULTS: none    HISTORY: Patient is a 56 y.o. male presented with a history of pain in the right knee for 14 years.. Onset of symptoms was gradual starting 2 years ago with rapidly worsening course since that time. Prior procedures on the knee are arthroscopy. Patient has been treated conservatively with over-the-counter NSAIDs and activity modification. Patient currently rates pain in the knee at 8 out of 10 with activity. There is pain at night. present.  They have been previously treated with: NSAIDS: Narcotics, NSAID with mild improvement  Knee injection with corticosteroid  was performed Knee injection with visco supplementation was performed Medications: NSAID, Steriods, Snyvisc with mild improvement   HOSPITAL COURSE:  NICHLOS CHENG is a 56 y.o. admitted on 06/24/2016 and found to have a diagnosis of RIGHT OSTEOARTHRITIS OF THE KNEE.  After appropriate laboratory studies were obtained  they were taken to the operating room on 06/24/2016 and underwent  Procedure(s): TOTAL KNEE ARTHROPLASTY  Right.   They were given perioperative antibiotics:  Anti-infectives    Start      Dose/Rate Route Frequency Ordered Stop   06/24/16 1930  vancomycin (VANCOCIN) IVPB 1000 mg/200 mL premix     1,000 mg 200 mL/hr over 60 Minutes Intravenous Every 12 hours 06/24/16 1644 06/24/16 2145   06/24/16 0539  vancomycin (VANCOCIN) IVPB 1000 mg/200 mL premix     1,000 mg 200 mL/hr over 60 Minutes Intravenous On call to O.R. 06/24/16 0539 06/24/16 0729   06/24/16 0525  vancomycin (VANCOCIN) 1-5 GM/200ML-% IVPB    Comments:  Scronce, Trina   : cabinet override      06/24/16 0525 06/24/16 0729    .  Tolerated the procedure well.     Toradol was given post op.  POD #1, allowed out of bed to a chair.  PT for ambulation and exercise program.  IV saline locked.  O2 discontionued. Hemovac pulled.  POD #2, continued PT and ambulation.    The remainder of the hospital course was dedicated to ambulation and strengthening.   The patient was discharged on 2 Days Post-Op in  Stable condition.  Blood products given:none  DIAGNOSTIC STUDIES: Recent vital signs:  Patient Vitals for the past 24 hrs:  BP Temp Temp src Pulse Resp SpO2  06/26/16 0436 130/76 98.6 F (37 C) Oral (!) 116 18 96 %  06/25/16 2100 118/63 98.4 F (36.9  C) Oral (!) 117 18 95 %  06/25/16 1425 108/76 98.4 F (36.9 C) Oral 98 18 98 %       Recent laboratory studies:  Recent Labs  06/25/16 0546 06/26/16 0438  WBC 6.6 7.4  HGB 10.1* 8.4*  HCT 29.1* 24.1*  PLT 144* 123*    Recent Labs  06/25/16 0546 06/26/16 0438  NA 134* 127*  K 4.1 3.8  CL 96* 91*  CO2 28 25  BUN 5* <5*  CREATININE 0.73 0.62  GLUCOSE 118* 116*  CALCIUM 9.1 8.5*   Lab Results  Component Value Date   INR 0.93 06/17/2016     Recent Radiographic Studies :  Dg Chest 2 View  Result Date: 06/17/2016 CLINICAL DATA:  56 year old male preop knee replacement chest x-ray. Hypertension. Seasonal allergies. No chest complaints. Initial encounter. EXAM: CHEST  2 VIEW COMPARISON:  01/06/2015, 09/13/2012 and 07/13/2006. FINDINGS: Minimal  peribronchial thickening stable and chronic in appearance. No infiltrate, congestive heart failure or pneumothorax. No plain film evidence of pulmonary malignancy. Suggestion of mild aortic arch calcifications. Mild scoliosis thoracic spine with superimposed degenerative changes. Left acromioclavicular joint degenerative changes. IMPRESSION: No acute pulmonary abnormality. Aortic atherosclerosis suspected. Electronically Signed   By: Genia Del M.D.   On: 06/17/2016 10:24    DISCHARGE INSTRUCTIONS: Discharge Instructions    CPM    Complete by:  As directed    Continuous passive motion machine (CPM):      Use the CPM from 0 to 60 for 6-8 hours per day.      You may increase by 5-10 degrees per day.  You may break it up into 2 or 3 sessions per day.      Use CPM for 3-4  weeks or until you are told to stop.   Call MD / Call 911    Complete by:  As directed    If you experience chest pain or shortness of breath, CALL 911 and be transported to the hospital emergency room.  If you develope a fever above 101 F, pus (white drainage) or increased drainage or redness at the wound, or calf pain, call your surgeon's office.   Change dressing    Complete by:  As directed    DO NOT CHANGE YOUR DRESSING   Constipation Prevention    Complete by:  As directed    Drink plenty of fluids.  Prune juice may be helpful.  You may use a stool softener, such as Colace (over the counter) 100 mg twice a day.  Use MiraLax (over the counter) for constipation as needed.   Diet general    Complete by:  As directed    Discharge instructions    Complete by:  As directed    [place] items at home which could result in a fall. This includes throw rugs or furniture in walking pathways ICE to the affected joint every three hours while awake for 30 minutes at a time, for at least the first 3-5 days, and then as needed for pain and swelling.  Continue to use ice for pain and swelling. You  may notice swelling that will progress down to the foot and ankle.  This is normal after surgery.  Elevate your leg when you are not up walking on it.   Continue to use the breathing machine you got in the hospital (incentive spirometer) which will help keep your temperature down.  It is common for your temperature to cycle  up and down following surgery, especially at night when you are not up moving around and exerting yourself.  The breathing machine keeps your lungs expanded and your temperature down.   DIET:  As you were doing prior to hospitalization, we recommend a well-balanced diet.  DRESSING / WOUND CARE / SHOWERING  Keep the surgical dressing until follow up.  The dressing is water proof, so you can shower without any extra covering.  IF THE DRESSING FALLS OFF or the wound gets wet inside, change the dressing with sterile gauze.  Please use good hand washing techniques before changing the dressing.  Do not use any lotions or creams on the incision until instructed by your surgeon.    ACTIVITY  Increase activity slowly as tolerated, but follow the weight bearing instructions below.   No driving for 6 weeks or until further direction given by your physician.  You cannot drive while taking narcotics.  No lifting or carrying greater than 10 lbs. until further directed by your surgeon. Avoid periods of inactivity such as sitting longer than an hour when not asleep. This helps prevent blood clots.  You may return to work once you are authorized by your doctor.     WEIGHT BEARING   Partial weight bearing with assist device as directed.  50%   EXERCISES  Results after joint replacement surgery are often greatly improved when you follow the exercise, range of motion and muscle strengthening exercises prescribed by your doctor. Safety measures are also important to protect the joint from further injury. Any time any of these exercises cause you to have increased pain or swelling, decrease  what you are doing until you are comfortable again and then slowly increase them. If you have problems or questions, call your caregiver or physical therapist for advice.   Rehabilitation is important following a joint replacement. After just a few days of immobilization, the muscles of the leg can become weakened and shrink (atrophy).  These exercises are designed to build up the tone and strength of the thigh and leg muscles and to improve motion. Often times heat used for twenty to thirty minutes before working out will loosen up your tissues and help with improving the range of motion but do not use heat for the first two weeks following surgery (sometimes heat can increase post-operative swelling).   These exercises can be done on a training (exercise) mat, on the floor, on a table or on a bed. Use whatever works the best and is most comfortable for you.    Use music or television while you are exercising so that the exercises are a pleasant break in your day. This will make your life better with the exercises acting as a break in your routine that you can look forward to.   Perform all exercises about fifteen times, three times per day or as directed.  You should exercise both the operative leg and the other leg as well.   Exercises include:  Quad Sets - Tighten up the muscle on the front of the thigh (Quad) and hold for 5-10 seconds.   Straight Leg Raises - With your knee straight (if you were given a brace, keep it on), lift the leg to 60 degrees, hold for 3 seconds, and slowly lower the leg.  Perform this exercise against resistance later as your leg gets stronger.  Leg Slides: Lying on your back, slowly slide your foot toward your buttocks, bending your knee up off the floor (only go  as far as is comfortable). Then slowly slide your foot back down until your leg is flat on the floor again.  Angel Wings: Lying on your back spread your legs to the side as far apart as you can without causing  discomfort.  Hamstring Strength:  Lying on your back, push your heel against the floor with your leg straight by tightening up the muscles of your buttocks.  Repeat, but this time bend your knee to a comfortable angle, and push your heel against the floor.  You may put a pillow under the heel to make it more comfortable if necessary.   A rehabilitation program following joint replacement surgery can speed recovery and prevent re-injury in the future due to weakened muscles. Contact your doctor or a physical therapist for more information on knee rehabilitation.    CONSTIPATION  Constipation is defined medically as fewer than three stools per week and severe constipation as less than one stool per week.  Even if you have a regular bowel pattern at home, your normal regimen is likely to be disrupted due to multiple reasons following surgery.  Combination of anesthesia, postoperative narcotics, change in appetite and fluid intake all can affect your bowels.   YOU MUST use at least one of the following options; they are listed in order of increasing strength to get the job done.  They are all available over the counter, and you may need to use some, POSSIBLY even all of these options:    Drink plenty of fluids (prune juice may be helpful) and high fiber foods Colace 100 mg by mouth twice a day  Senokot for constipation as directed and as needed Dulcolax (bisacodyl), take with full glass of water  Miralax (polyethylene glycol) once or twice a day as needed.  If you have tried all these things and are unable to have a bowel movement in the first 3-4 days after surgery call either your surgeon or your primary doctor.    If you experience loose stools or diarrhea, hold the medications until you stool forms back up.  If your symptoms do not get better within 1 week or if they get worse, check with your doctor.  If you experience "the worst abdominal pain ever" or develop nausea or vomiting, please contact  the office immediately for further recommendations for treatment.   ITCHING:  If you experience itching with your medications, try taking only a single pain pill, or even half a pain pill at a time.  You can also use Benadryl over the counter for itching or also to help with sleep.   TED HOSE STOCKINGS:  Use stockings on both legs until for at least 2 weeks or as directed by physician office. They may be removed at night for sleeping.  MEDICATIONS:  See your medication summary on the "After Visit Summary" that nursing will review with you.  You may have some home medications which will be placed on hold until you complete the course of blood thinner medication.  It is important for you to complete the blood thinner medication as prescribed.  PRECAUTIONS:  If you experience chest pain or shortness of breath - call 911 immediately for transfer to the hospital emergency department.   If you develop a fever greater that 101 F, purulent drainage from wound, increased redness or drainage from wound, foul odor from the wound/dressing, or calf pain - CONTACT YOUR SURGEON.  FOLLOW-UP APPOINTMENTS:  If you do not already have a post-op appointment, please call the office for an appointment to be seen by your surgeon.  Guidelines for how soon to be seen are listed in your "After Visit Summary", but are typically between 1-4 weeks after surgery.  OTHER INSTRUCTIONS:   Knee Replacement:  Do not place pillow under knee, focus on keeping the knee straight while resting. CPM instructions: 0-90 degrees, 2 hours in the morning, 2 hours in the afternoon, and 2 hours in the evening. Place foam block, curve side up under heel at all times except when in CPM or when walking.  DO NOT modify, tear, cut, or change the foam block in any way.  MAKE SURE YOU:  Understand these instructions.  Get help right away if you are not doing well or get worse.    Thank you for  letting us be a part of your medical care team.  It is a privilege we respect greatly.  We hope these instructions will help you stay on track for a fast and full recovery!   Do not put a pillow under the knee. Place it under the heel.    Complete by:  As directed    Driving restrictions    Complete by:  As directed    No driving for 6 weeks   Increase activity slowly as tolerated    Complete by:  As directed    Lifting restrictions    Complete by:  As directed    No lifting for 6 weeks   Partial weight bearing    Complete by:  As directed    % Body Weight:  50%   Laterality:  right   Extremity:  Lower   Patient may shower    Complete by:  As directed    You may shower over the brown dressing   TED hose    Complete by:  As directed    Use stockings (TED hose) for 2-3 weeks on right leg.  You may remove them at night for sleeping.      DISCHARGE MEDICATIONS:   Allergies as of 06/26/2016      Reactions   Mushroom Extract Complex Anaphylaxis   Penicillins Shortness Of Breath   Childhood allergy.  Has patient had a PCN reaction causing immediate rash, facial/tongue/throat swelling, SOB or lightheadedness with hypotension: No Has patient had a PCN reaction causing severe rash involving mucus membranes or skin necrosis: No Has patient had a PCN reaction that required hospitalization No Has patient had a PCN reaction occurring within the last 10 years: No If all of the above answers are "NO", then may proceed with C   Codeine Nausea And Vomiting   Iodine Rash      Medication List    STOP taking these medications   aspirin 81 MG tablet   diphenhydrAMINE 25 MG tablet Commonly known as:  BENADRYL   HYDROcodone-acetaminophen 5-325 MG tablet Commonly known as:  NORCO/VICODIN   ibuprofen 200 MG tablet Commonly known as:  ADVIL,MOTRIN     TAKE these medications   acetaminophen 500 MG tablet Commonly known as:  TYLENOL Take 2 tablets (1,000 mg total) by mouth every 6 (six)  hours as needed.   buPROPion 150 MG 12 hr tablet Commonly known as:  WELLBUTRIN SR Take 150 mg by mouth 2 (two) times daily.   methocarbamol 500 MG tablet Commonly known as:  ROBAXIN Take 1 tablet (500 mg total) by mouth every 6 (six)  hours as needed for muscle spasms.   multivitamin with minerals Tabs tablet Take 1 tablet by mouth daily.   oxyCODONE 5 MG immediate release tablet Commonly known as:  Oxy IR/ROXICODONE Take 1-2 tablets (5-10 mg total) by mouth every 4 (four) hours as needed for breakthrough pain.   rivaroxaban 10 MG Tabs tablet Commonly known as:  XARELTO Take 1 tablet (10 mg total) by mouth daily with breakfast. Start taking on:  06/27/2016   valsartan 320 MG tablet Commonly known as:  DIOVAN Take 320 mg by mouth daily.   VISINE OP Apply 1 drop to eye daily.            Durable Medical Equipment        Start     Ordered   06/24/16 1645  DME Walker rolling  Once    Question:  Patient needs a walker to treat with the following condition  Answer:  S/P TKR (total knee replacement) using cement, right   06/24/16 1644   06/24/16 1645  DME 3 n 1  Once     06/24/16 1644   06/24/16 1645  DME Bedside commode  Once    Question:  Patient needs a bedside commode to treat with the following condition  Answer:  S/P TKR (total knee replacement) using cement, right   06/24/16 1644      FOLLOW UP VISIT:   Follow-up Information    [facility] Follow up.   Specialty:  [place] Why:  Someone from [facility] will contact you to arrange start date and time for therapy. Contact information: [place] [address] [phone]        Garald Balding, MD Follow up on 07/07/2016.   Specialty:  Orthopedic Surgery Contact information: [address] [place] [address] [phone]           DISPOSITION:   Home  CONDITION:  Stable   Mike Craze. [place], [place] [phone]  06/26/2016 12:49 PM

## 2016-06-27 ENCOUNTER — Telehealth (INDEPENDENT_AMBULATORY_CARE_PROVIDER_SITE_OTHER): Payer: Self-pay | Admitting: Radiology

## 2016-06-27 DIAGNOSIS — M1711 Unilateral primary osteoarthritis, right knee: Secondary | ICD-10-CM | POA: Diagnosis not present

## 2016-06-27 NOTE — Telephone Encounter (Signed)
Patients wife Philip Winters calling this morning. Patient is having hallucinations taking oxycodone. He is seeing things crawl on the ceiling. Dr .Durward Fortes was notified and spoke with patients wife over the phone advising to decrease amount and frequency of medication. He does not have a fever.

## 2016-06-28 DIAGNOSIS — Z471 Aftercare following joint replacement surgery: Secondary | ICD-10-CM | POA: Diagnosis not present

## 2016-06-28 DIAGNOSIS — I10 Essential (primary) hypertension: Secondary | ICD-10-CM | POA: Diagnosis not present

## 2016-06-28 DIAGNOSIS — Z96651 Presence of right artificial knee joint: Secondary | ICD-10-CM | POA: Diagnosis not present

## 2016-06-30 ENCOUNTER — Telehealth (INDEPENDENT_AMBULATORY_CARE_PROVIDER_SITE_OTHER): Payer: Self-pay | Admitting: Orthopaedic Surgery

## 2016-06-30 NOTE — Telephone Encounter (Signed)
Patient's wife called and states the patient is hallucinating on oxycodone and would like to see if he can switch to hydrocodone.   She also has questions regarding his BP. She says it was 113/68 but patient is shaky, his color has changed and doesn't know if that's an issue with the pain medication or his blood pressure meds. Please call asap

## 2016-06-30 NOTE — Telephone Encounter (Signed)
Shirlee Limerick w/[facility] saw patient over the weekend and is calling for verbal approval to see patient 1 time for 1 week and 3 times a week for 3 weeks. Cb# [id number]

## 2016-07-01 ENCOUNTER — Telehealth (INDEPENDENT_AMBULATORY_CARE_PROVIDER_SITE_OTHER): Payer: Self-pay

## 2016-07-01 NOTE — Telephone Encounter (Signed)
Please call.

## 2016-07-01 NOTE — Telephone Encounter (Signed)
Spoke with Philip Winters about his medication. He said he has it well maintained as he is taking a 1/2 Oxy/IR with a 500mg  Tylenol and has no issues with that.  Checked about possible interactions with Dr. Koleen Nimrod and she said this was acceptable and not to exceed 6-500mg  Tylenol in a day.   I also encouraged [name] that he did not have to take the narcotics and he agreed. Also confirmed that a Bp of 113/68 was within normal range.  He is 1 week status post Right TKA

## 2016-07-01 NOTE — Telephone Encounter (Signed)
OK 

## 2016-07-01 NOTE — Telephone Encounter (Signed)
done

## 2016-07-01 NOTE — Telephone Encounter (Signed)
Ok to switch to hydrocodone-needs to check with family physician re shakes and blood pressure, Cannot call until after surgery

## 2016-07-02 DIAGNOSIS — I10 Essential (primary) hypertension: Secondary | ICD-10-CM | POA: Diagnosis not present

## 2016-07-02 DIAGNOSIS — Z471 Aftercare following joint replacement surgery: Secondary | ICD-10-CM | POA: Diagnosis not present

## 2016-07-02 DIAGNOSIS — Z96651 Presence of right artificial knee joint: Secondary | ICD-10-CM | POA: Diagnosis not present

## 2016-07-04 DIAGNOSIS — Z471 Aftercare following joint replacement surgery: Secondary | ICD-10-CM | POA: Diagnosis not present

## 2016-07-04 DIAGNOSIS — Z96651 Presence of right artificial knee joint: Secondary | ICD-10-CM | POA: Diagnosis not present

## 2016-07-04 DIAGNOSIS — I10 Essential (primary) hypertension: Secondary | ICD-10-CM | POA: Diagnosis not present

## 2016-07-07 ENCOUNTER — Ambulatory Visit (INDEPENDENT_AMBULATORY_CARE_PROVIDER_SITE_OTHER): Payer: 59

## 2016-07-07 ENCOUNTER — Encounter (INDEPENDENT_AMBULATORY_CARE_PROVIDER_SITE_OTHER): Payer: Self-pay | Admitting: Orthopaedic Surgery

## 2016-07-07 ENCOUNTER — Ambulatory Visit (INDEPENDENT_AMBULATORY_CARE_PROVIDER_SITE_OTHER): Payer: 59 | Admitting: Orthopaedic Surgery

## 2016-07-07 VITALS — BP 127/96 | HR 114 | Ht 68.0 in | Wt 155.0 lb

## 2016-07-07 DIAGNOSIS — Z96651 Presence of right artificial knee joint: Secondary | ICD-10-CM

## 2016-07-07 DIAGNOSIS — I10 Essential (primary) hypertension: Secondary | ICD-10-CM | POA: Diagnosis not present

## 2016-07-07 DIAGNOSIS — Z471 Aftercare following joint replacement surgery: Secondary | ICD-10-CM | POA: Diagnosis not present

## 2016-07-07 NOTE — Progress Notes (Signed)
Office Visit Note   Patient: Philip Winters           Date of Birth: 1961-03-15           MRN: [id number] Visit Date: 07/07/2016              Requested by: Antony Contras, MD [address] [place], [place] [address] PCP: Gara Kroner, MD   Assessment & Plan: Visit Diagnoses: 2 weeks status post right total knee replacement and appears to be doing quite well.Mr Ishii some issues with "shaking" that appeared to be related to the oxycodone. He is taking less now and having much less of a problem. File signs have been stable. He completes his in home therapy this week  Plan: Discontinue his xarelto, limited oral pain medicines, weightbearing as tolerated with cane or walker. Start outpatient therapy this week at [facility]. Office in 2 weeks  Follow-Up Instructions: No Follow-up on file.   Orders:  No orders of the defined types were placed in this encounter.  No orders of the defined types were placed in this encounter.     Procedures: No procedures performed   Clinical Data: No additional findings.   Subjective: No chief complaint on file.   Pt 2 weeks status post RIght Total knee replacement. Steri strips applied. Pt wants to go to [facility] neuro[facility] for outpatient therapy.Pt ambulates with a walker, tried a cane, twisted his back. Advised pt to remove anything that could possibly trip him up in the house including not letting the dogs jump on him.  Mr Bohlander is progressing nicely with physical therapy. He's having less shaking after taking less pain medicine. Vital signs stable. Denies fever or chills. Pain is experiencing appears to be appropriate for the surgery  Review of Systems   Objective: Vital Signs: There were no vitals taken for this visit.  Physical Exam  Ortho Exam right knee incision looks fine. The staples removed and Steri-Strips are applied. No calf pain. No significant swelling all there was a small effusion. Think he lacks just a  few degrees to full extension but is "working on it". No swelling distally. neurovascular exam. No instability.  Specialty Comments:  No specialty comments available.  Imaging: No results found.   PMFS History: Patient Active Problem List   Diagnosis Date Noted  . Unilateral primary osteoarthritis, right knee 06/24/2016  . S/P total knee replacement using cement, right 06/24/2016  . HYPERLIPIDEMIA 01/18/2007  . HYPERTENSION 01/18/2007   Past Medical History:  Diagnosis Date  . Allergy   . Arthritis   . GERD (gastroesophageal reflux disease)    occ  . Heart murmur    baby  . Hypertension   . Seasonal allergies     No family history on file.  Past Surgical History:  Procedure Laterality Date  . CARPAL TUNNEL WITH CUBITAL TUNNEL Left 11/10/2013   Procedure: LEFT CARPAL TUNNEL RELEASE;  Surgeon: Cammie Sickle, MD;  Location: [place];  Service: Orthopedics;  Laterality: Left;  . COLONOSCOPY    . KNEE ARTHROSCOPY  IP:[id number]   left and right  . NASAL SINUS SURGERY  05/2015  . TOTAL KNEE ARTHROPLASTY Right 06/24/2016   Procedure: TOTAL KNEE ARTHROPLASTY;  Surgeon: Garald Balding, MD;  Location: [facility] [facility];  Service: Orthopedics;  Laterality: Right;  . TRIGGER FINGER RELEASE Left 11/10/2013   Procedure: LEFT INDEX A-1 PULLEY RELEASE;  Surgeon: Cammie Sickle, MD;  Location:  [place];  Service: Orthopedics;  Laterality: Left;  . ULNAR NERVE TRANSPOSITION Left 11/10/2013   Procedure: ULNAR NERVE TRANSPOSITION;  Surgeon: Cammie Sickle, MD;  Location: [place];  Service: Orthopedics;  Laterality: Left;   Social History   Occupational History  . Not on file.   Social History Main Topics  . Smoking status: Former Smoker    Packs/day: 1.00    Years: 37.00    Types: Cigarettes    Quit date: 05/04/2016  . Smokeless tobacco: Never Used  . Alcohol use 0.0 oz/week     Comment: occ  . Drug use: No  . Sexual activity: Not  on file

## 2016-07-08 DIAGNOSIS — M1711 Unilateral primary osteoarthritis, right knee: Secondary | ICD-10-CM | POA: Diagnosis not present

## 2016-07-09 ENCOUNTER — Ambulatory Visit: Payer: 59 | Attending: Orthopaedic Surgery

## 2016-07-09 DIAGNOSIS — R2689 Other abnormalities of gait and mobility: Secondary | ICD-10-CM | POA: Insufficient documentation

## 2016-07-09 DIAGNOSIS — M25561 Pain in right knee: Secondary | ICD-10-CM | POA: Diagnosis present

## 2016-07-09 DIAGNOSIS — R600 Localized edema: Secondary | ICD-10-CM | POA: Diagnosis not present

## 2016-07-09 DIAGNOSIS — M25661 Stiffness of right knee, not elsewhere classified: Secondary | ICD-10-CM | POA: Insufficient documentation

## 2016-07-09 DIAGNOSIS — M6281 Muscle weakness (generalized): Secondary | ICD-10-CM

## 2016-07-09 NOTE — Therapy (Signed)
[facility] [address], [place], [place], [address] Phone: [phone]   Fax:  [phone]  Physical Therapy Evaluation  Patient Details  Name: Philip Winters MRN: [id number] Date of Birth: 1960-06-25 Referring Provider: Joni Fears, MD  Encounter Date: 07/09/2016      PT End of Session - 07/09/16 0900    Visit Number 1   Date for PT Re-Evaluation 09/03/16   PT Start Time 0835   PT Stop Time 0921   PT Time Calculation (min) 46 min   Activity Tolerance Patient tolerated treatment well   Behavior During Therapy [facility] for tasks assessed/performed      Past Medical History:  Diagnosis Date  . Allergy   . Arthritis   . GERD (gastroesophageal reflux disease)    occ  . Heart murmur    baby  . Hypertension   . Seasonal allergies     Past Surgical History:  Procedure Laterality Date  . CARPAL TUNNEL WITH CUBITAL TUNNEL Left 11/10/2013   Procedure: LEFT CARPAL TUNNEL RELEASE;  Surgeon: Cammie Sickle, MD;  Location: [place];  Service: Orthopedics;  Laterality: Left;  . COLONOSCOPY    . KNEE ARTHROSCOPY  [id number]   left and right  . NASAL SINUS SURGERY  05/2015  . TOTAL KNEE ARTHROPLASTY Right 06/24/2016   Procedure: TOTAL KNEE ARTHROPLASTY;  Surgeon: Garald Balding, MD;  Location: [place];  Service: Orthopedics;  Laterality: Right;  . TRIGGER FINGER RELEASE Left 11/10/2013   Procedure: LEFT INDEX A-1 PULLEY RELEASE;  Surgeon: Cammie Sickle, MD;  Location: [place];  Service: Orthopedics;  Laterality: Left;  . ULNAR NERVE TRANSPOSITION Left 11/10/2013   Procedure: ULNAR NERVE TRANSPOSITION;  Surgeon: Cammie Sickle, MD;  Location: [place];  Service: Orthopedics;  Laterality: Left;    There were no vitals filed for this visit.       Subjective Assessment - 07/09/16 0839    Subjective Pt presents to PT s/p Rt TKA performed 06/24/16.  Pt had home  health PT after surgery.     Pertinent History Rt TKA 06/24/16   Limitations Sitting;Standing;Walking   How long can you sit comfortably? 30 minutes   How long can you stand comfortably? 15 minutes    How long can you walk comfortably? 15 minutes   Patient Stated Goals reduce pain, walk without device, improve Rt knee A/ROM, reduce edema   Currently in Pain? Yes   Pain Score 6    Pain Location Knee   Pain Orientation Right   Pain Descriptors / Indicators Aching;Tightness   Pain Type Surgical pain   Pain Onset 1 to 4 weeks ago   Pain Frequency Constant   Aggravating Factors  standing too long, bending it, sitting too long   Pain Relieving Factors ice, rest/elevation            [facility] PT Assessment - 07/09/16 0001      Assessment   Medical Diagnosis presence of Rt artificial knee joint   Referring Provider Joni Fears, MD   Onset Date/Surgical Date 06/24/16   Next MD Visit 07/21/16   Prior Therapy home health PT after surgery     Precautions   Precautions None     Restrictions   Weight Bearing Restrictions No     Balance Screen   Has the patient fallen in the past 6 months No   Has the patient had a decrease  in activity level because of a fear of falling?  No   Is the patient reluctant to leave their home because of a fear of falling?  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, standing, lifting   Leisure walking     Cognition   Overall Cognitive Status Within Functional Limits for tasks assessed     Observation/Other Assessments   Focus on Therapeutic Outcomes (FOTO)   67% limitation     Observation/Other Assessments-Edema    Edema Circumferential  Lt 14.5 inches, Rt 15.5 inches midpatellar     Posture/Postural Control   Posture/Postural Control No significant limitations     ROM / Strength   AROM / PROM / Strength AROM;PROM;Strength     AROM   Overall AROM  Deficits   AROM Assessment Site Knee   Right/Left Knee Right;Left   Right Knee Extension 18   Right Knee Flexion 94   Left Knee Extension 5   Left Knee Flexion 130     PROM   Overall PROM  Deficits   Overall PROM Comments Rt knee -15 to 97     Strength   Overall Strength Deficits   Strength Assessment Site Knee;Hip   Right/Left Hip Right;Left   Right Hip Flexion 3+/5   Left Hip Flexion 4/5   Right/Left Knee Right;Left   Right Knee Flexion 4-/5   Right Knee Extension 3/5  quad lag   Left Knee Flexion 4/5   Left Knee Extension 4+/5     Palpation   Patella mobility reduced patellar mobility on the Rt in all directions   Palpation comment warmth and edema about the Rt joint, palpable tenderness over Rt distal hamstrings and proximal quads     Transfers   Transfers Sit to Stand;Stand to Sit   Sit to Stand With upper extremity assist   Stand to Sit With upper extremity assist     Ambulation/Gait   Ambulation/Gait Yes   Ambulation/Gait Assistance 6: Modified independent (Device/Increase time)   Ambulation Distance (Feet) 100 Feet   Assistive device Rolling walker   Gait Pattern Step-through pattern;Decreased hip/knee flexion - right;Decreased stance time - right                   [facility] Adult PT Treatment/Exercise - 07/09/16 0001      Exercises   Exercises Knee/Hip     Knee/Hip Exercises: Aerobic   Stationary Bike Level 0 for ROM x 8 minutes  verbal cues for technique and to reduce substitution     Modalities   Modalities Vasopneumatic     Vasopneumatic   Number Minutes Vasopneumatic  15 minutes   Vasopnuematic Location  Knee   Vasopneumatic Pressure  Medium   Vasopneumatic Temperature  3 snowflakes                PT Education - 07/09/16 0905    Education provided Yes   Education Details verbal review of all HEP from home health PT   Person(s) Educated Patient   Methods Explanation;Demonstration   Comprehension Verbalized understanding;Returned  demonstration          PT Short Term Goals - 07/09/16 0831      PT SHORT TERM GOAL #1   Title be independent in initial HEP   Time 4   Period Weeks   Status New     PT SHORT TERM GOAL #2   Title wean from walker to use of cane for home distances   Time 4   Period Weeks   Status New     PT SHORT TERM GOAL #3   Title demonstrate Rt knee A/ROM extension to lacking < or = to 10 degrees to normalize gait   Time 4   Period Weeks   Status New     PT SHORT TERM GOAL #4   Title demonstrate Rt quad strength > or = to 4/5 without lag to improve Rt knee stability   Time 4   Period Weeks   Status New     PT SHORT TERM GOAL #5   Title improve endurance to walk for 20-25 minutes without need to rest   Time 4   Period Weeks   Status New           PT Long Term Goals - 07/09/16 0831      PT LONG TERM GOAL #1   Title be independent in advanced HEP   Time 8   Period Weeks   Status New     PT LONG TERM GOAL #2   Title reduce FOTO to < or = to 49% limitation   Time 8   Status New     PT LONG TERM GOAL #3   Title wean from assistive device on level surface and demonstrate symmetry   Time 8   Period Weeks   Status New     PT LONG TERM GOAL #4   Title demonstrate 4+/5 Rt hip and knee strength to improve safety and endurance for gait in the community and return to work   Time 8   Period Weeks   Status New     PT LONG TERM GOAL #5   Title demonstrate Rt knee A/ROM lacking < or = to 5 degrees extension to 110 flexion to improve mobility without substitution   Time 8   Period Weeks   Status New     Additional Long Term Goals   Additional Long Term Goals Yes      PT LONG TERM GOAL #6   Title report < or 3/10 Rt knee pain with standing and walking   Time 8   Period Weeks   Status New               Plan - 07/09/16 [id number]    Clinical Impression Statement Pt presents to PT s/p Rt TKA performed 06/24/16.  Pt had home health PT after surgery.  Pt ambulates with a walker with reduced stance time and Rt knee flexion in stance, reduced Rt knee A/ROM, quad atrophy and quad lag, edema, and hip/knee weakness bilaterally.  Pt is a low complexity evaluation due to lack of comorbidities that impact care.  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     Rehab Potential Good   PT Frequency 3x / week   PT Duration 8 weeks   PT Treatment/Interventions ADLs/Self Care Home Management;Cryotherapy;Electrical Stimulation;Functional mobility training;Stair training;Gait training;Ultrasound;Moist  Heat;Therapeutic activities;Therapeutic exercise;Neuromuscular re-education;Balance training;Patient/family education;Passive range of motion;Scar mobilization;Manual techniques;Taping;Vasopneumatic Device   PT Next Visit Plan edema management, A/ROM progression scar mobs/soft tissue to quad/hamstring/gastroc, strength, gait   Consulted and Agree with Plan of Care Patient      Patient will benefit from skilled therapeutic intervention in order to improve the following deficits and impairments:  Abnormal gait, Decreased range of motion, Decreased endurance, Decreased activity tolerance, Pain, Decreased strength, Increased edema, Decreased balance, Hypomobility, Impaired flexibility  Visit Diagnosis: Acute pain of right knee - Plan: PT plan of care cert/re-cert  Localized edema - Plan: PT plan of care cert/re-cert  Other abnormalities of gait and mobility - Plan: PT plan of care cert/re-cert  Muscle weakness (generalized) -  Plan: PT plan of care cert/re-cert  Stiffness of right knee, not elsewhere classified - Plan: PT plan of care cert/re-cert     Problem List Patient Active Problem List   Diagnosis Date Noted  . Unilateral primary osteoarthritis, right knee 06/24/2016  . S/P total knee replacement using cement, right 06/24/2016  . HYPERLIPIDEMIA 01/18/2007  . HYPERTENSION 01/18/2007    Sigurd Sos, PT 07/09/16 9:16 AM  [facility] [address] [place], [place], [address] Phone: [phone]   Fax:  [phone]  Name: Philip Winters MRN: [id number] Date of Birth: 14-Mar-1961

## 2016-07-11 ENCOUNTER — Ambulatory Visit: Payer: 59 | Admitting: Physical Therapy

## 2016-07-11 ENCOUNTER — Encounter: Payer: Self-pay | Admitting: Physical Therapy

## 2016-07-11 DIAGNOSIS — R600 Localized edema: Secondary | ICD-10-CM

## 2016-07-11 DIAGNOSIS — R2689 Other abnormalities of gait and mobility: Secondary | ICD-10-CM

## 2016-07-11 DIAGNOSIS — M25561 Pain in right knee: Secondary | ICD-10-CM

## 2016-07-11 DIAGNOSIS — M6281 Muscle weakness (generalized): Secondary | ICD-10-CM

## 2016-07-11 DIAGNOSIS — M25661 Stiffness of right knee, not elsewhere classified: Secondary | ICD-10-CM

## 2016-07-11 NOTE — Therapy (Signed)
[facility] [address] [place], [place], [address] Phone: [phone]   Fax:  [phone]  Physical Therapy Treatment  Patient Details  Name: Philip Winters MRN: [id number] Date of Birth: September 14, 1960 Referring Provider: Joni Fears, MD  Encounter Date: 07/11/2016      PT End of Session - 07/11/16 0844    Visit Number 2   Date for PT Re-Evaluation 09/03/16   PT Start Time 0800   PT Stop Time 0855   PT Time Calculation (min) 55 min   Activity Tolerance Patient tolerated treatment well   Behavior During Therapy [facility] for tasks assessed/performed      Past Medical History:  Diagnosis Date  . Allergy   . Arthritis   . GERD (gastroesophageal reflux disease)    occ  . Heart murmur    baby  . Hypertension   . Seasonal allergies     Past Surgical History:  Procedure Laterality Date  . CARPAL TUNNEL WITH CUBITAL TUNNEL Left 11/10/2013   Procedure: LEFT CARPAL TUNNEL RELEASE;  Surgeon: Cammie Sickle, MD;  Location: [place];  Service: Orthopedics;  Laterality: Left;  . COLONOSCOPY    . KNEE ARTHROSCOPY  [id number]   left and right  . NASAL SINUS SURGERY  05/2015  . TOTAL KNEE ARTHROPLASTY Right 06/24/2016   Procedure: TOTAL KNEE ARTHROPLASTY;  Surgeon: Garald Balding, MD;  Location: [place];  Service: Orthopedics;  Laterality: Right;  . TRIGGER FINGER RELEASE Left 11/10/2013   Procedure: LEFT INDEX A-1 PULLEY RELEASE;  Surgeon: Cammie Sickle, MD;  Location: [place];  Service: Orthopedics;  Laterality: Left;  . ULNAR NERVE TRANSPOSITION Left 11/10/2013   Procedure: ULNAR NERVE TRANSPOSITION;  Surgeon: Cammie Sickle, MD;  Location: [place];  Service: Orthopedics;  Laterality: Left;    There were no vitals filed for this visit.      Subjective Assessment - 07/11/16 0807    Subjective I walked.  I am having trouble getting my knee straight.    Pertinent History Rt TKA 06/24/16   Limitations Sitting;Standing;Walking   How long can you sit comfortably? 30 minutes   How long can you stand comfortably? 15 minutes    How long can you walk comfortably? 15 minutes   Patient Stated Goals reduce pain, walk without device, improve Rt knee A/ROM, reduce edema   Currently in Pain? Yes   Pain Score 4    Pain Location Knee   Pain Orientation Right   Pain Descriptors / Indicators Aching;Tightness   Pain Type Surgical pain   Pain Onset 1 to 4 weeks ago   Pain Frequency Constant   Aggravating Factors  standing too long, bending it, sitting too long   Pain Relieving Factors ice , rest/elevation            [facility] PT Assessment - 07/11/16 0001      PROM   Overall PROM Comments -5 to 102 degrees                     [facility] Adult PT Treatment/Exercise - 07/11/16 0001      Knee/Hip Exercises: Stretches   Gastroc Stretch 2 reps;30 seconds  on step with heels down   Other Knee/Hip Stretches right foot on high step and lean into knee to increase right knee flexoin 15 x hold 5 sec     Knee/Hip Exercises: Aerobic   Stationary Bike  Level 0 for ROM x 5 minutes  able to go around with seat back; some twinges     Knee/Hip Exercises: Standing   Heel Raises 20 reps;Both  on step through full motion   Forward Step Up Right;10 reps;Hand Hold: 2   Rebounder 3 ways 1 min each     Knee/Hip Exercises: Seated   Long Arc Quad Right;Strengthening;2 sets;10 reps   Long Arc Quad Weight 4 lbs.   Long [employer] Limitations keep thigh pressed into chair   Hamstring Curl Right;20 reps  red band     Knee/Hip Exercises: Supine   Quad Sets Strengthening;Right;AAROM;10 reps  hold 5 sec with overpressure on end range   Knee Flexion AAROM;Right;1 set;10 reps  both feet on red physioball     Modalities   Modalities Vasopneumatic     Vasopneumatic   Number Minutes Vasopneumatic  15 minutes   Vasopnuematic Location  Knee  right    Vasopneumatic Pressure Medium   Vasopneumatic Temperature  3 snowflakes     Manual Therapy   Manual Therapy Soft tissue mobilization;Passive ROM   Soft tissue mobilization scar massage; soft tissue work to right gastroc and knee and quads   Passive ROM right knee for flexion and extension                PT Education - 07/11/16 0843    Education provided Yes   Education Details how to stretch right knee into extension using a weight   Person(s) Educated Patient   Methods Explanation;Demonstration   Comprehension Verbalized understanding;Returned demonstration          PT Short Term Goals - 07/09/16 0831      PT SHORT TERM GOAL #1   Title be independent in initial HEP   Time 4   Period Weeks   Status New     PT SHORT TERM GOAL #2   Title wean from walker to use of cane for home distances   Time 4   Period Weeks   Status New     PT SHORT TERM GOAL #3   Title demonstrate Rt knee A/ROM extension to lacking < or = to 10 degrees to normalize gait   Time 4   Period Weeks   Status New     PT SHORT TERM GOAL #4   Title demonstrate Rt quad strength > or = to 4/5 without lag to improve Rt knee stability   Time 4   Period Weeks   Status New     PT SHORT TERM GOAL #5   Title improve endurance to walk for 20-25 minutes without need to rest   Time 4   Period Weeks   Status New           PT Long Term Goals - 07/09/16 0831      PT LONG TERM GOAL #1   Title be independent in advanced HEP   Time 8   Period Weeks   Status New     PT LONG TERM GOAL #2   Title reduce FOTO to < or = to 49% limitation   Time 8   Status New     PT LONG TERM GOAL #3   Title wean from assistive device on level surface and demonstrate symmetry   Time 8   Period Weeks   Status New     PT LONG TERM GOAL #4   Title demonstrate 4+/5 Rt hip and knee strength to improve safety and endurance for  gait in the community and return to work   Time 8   Period Weeks   Status New     PT  LONG TERM GOAL #5   Title demonstrate Rt knee A/ROM lacking < or = to 5 degrees extension to 110 flexion to improve mobility without substitution   Time 8   Period Weeks   Status New     Additional Long Term Goals   Additional Long Term Goals Yes     PT LONG TERM GOAL #6   Title report < or 3/10 Rt knee pain with standing and walking   Time 8   Period Weeks   Status New               Plan - 07/11/16 0844    Clinical Impression Statement Patient was able to go all the way around on the bike with seat further back.  Patient has increased right knee PROM from initial evaluation.  Patient works hard in therapy.  Patient has decreased scar mobility.  Patient will benefit from skilled therapy to increase ROM and strength.    Rehab Potential Good   Clinical Impairments Affecting Rehab Potential None   PT Frequency 3x / week   PT Duration 8 weeks   PT Treatment/Interventions ADLs/Self Care Home Management;Cryotherapy;Electrical Stimulation;Functional mobility training;Stair training;Gait training;Ultrasound;Moist Heat;Therapeutic activities;Therapeutic exercise;Neuromuscular re-education;Balance training;Patient/family education;Passive range of motion;Scar mobilization;Manual techniques;Taping;Vasopneumatic Device   PT Next Visit Plan edema management, A/ROM progression scar mobs/soft tissue to quad/hamstring/gastroc, strength, gait   PT Home Exercise Plan progress as needed   Recommended Other Services None   Consulted and Agree with Plan of Care Patient      Patient will benefit from skilled therapeutic intervention in order to improve the following deficits and impairments:  Abnormal gait, Decreased range of motion, Decreased endurance, Decreased activity tolerance, Pain, Decreased strength, Increased edema, Decreased balance, Hypomobility, Impaired flexibility  Visit Diagnosis: Acute pain of right knee  Localized edema  Other abnormalities of gait and mobility  Muscle  weakness (generalized)  Stiffness of right knee, not elsewhere classified     Problem List Patient Active Problem List   Diagnosis Date Noted  . Unilateral primary osteoarthritis, right knee 06/24/2016  . S/P total knee replacement using cement, right 06/24/2016  . HYPERLIPIDEMIA 01/18/2007  . HYPERTENSION 01/18/2007    Earlie Counts, PT 07/11/16 8:48 AM   [facility] [address] [place], [place], [address] Phone: [phone]   Fax:  [phone]  Name: HAKIEM NOTO MRN: [id number] Date of Birth: [birth year]

## 2016-07-12 ENCOUNTER — Other Ambulatory Visit (INDEPENDENT_AMBULATORY_CARE_PROVIDER_SITE_OTHER): Payer: Self-pay | Admitting: Orthopedic Surgery

## 2016-07-14 ENCOUNTER — Ambulatory Visit: Payer: 59

## 2016-07-14 DIAGNOSIS — M6281 Muscle weakness (generalized): Secondary | ICD-10-CM

## 2016-07-14 DIAGNOSIS — R2689 Other abnormalities of gait and mobility: Secondary | ICD-10-CM

## 2016-07-14 DIAGNOSIS — M25661 Stiffness of right knee, not elsewhere classified: Secondary | ICD-10-CM

## 2016-07-14 DIAGNOSIS — M25561 Pain in right knee: Secondary | ICD-10-CM | POA: Diagnosis not present

## 2016-07-14 DIAGNOSIS — R600 Localized edema: Secondary | ICD-10-CM

## 2016-07-14 NOTE — Therapy (Signed)
[facility] [address], [place], [place], [address] Phone: [phone]   Fax:  [phone]  Physical Therapy Treatment  Patient Details  Name: Philip Winters MRN: [id number] Date of Birth: March 03, 1961 Referring Provider: Joni Fears, MD  Encounter Date: 07/14/2016      PT End of Session - 07/14/16 0827    Visit Number 3   Date for PT Re-Evaluation 09/03/16   PT Start Time 0800   PT Stop Time 0858   PT Time Calculation (min) 58 min   Activity Tolerance Patient tolerated treatment well   Behavior During Therapy [facility] for tasks assessed/performed      Past Medical History:  Diagnosis Date  . Allergy   . Arthritis   . GERD (gastroesophageal reflux disease)    occ  . Heart murmur    baby  . Hypertension   . Seasonal allergies     Past Surgical History:  Procedure Laterality Date  . CARPAL TUNNEL WITH CUBITAL TUNNEL Left 11/10/2013   Procedure: LEFT CARPAL TUNNEL RELEASE;  Surgeon: Cammie Sickle, MD;  Location: [place];  Service: Orthopedics;  Laterality: Left;  . COLONOSCOPY    . KNEE ARTHROSCOPY  [id number]   left and right  . NASAL SINUS SURGERY  05/2015  . TOTAL KNEE ARTHROPLASTY Right 06/24/2016   Procedure: TOTAL KNEE ARTHROPLASTY;  Surgeon: Garald Balding, MD;  Location: [place];  Service: Orthopedics;  Laterality: Right;  . TRIGGER FINGER RELEASE Left 11/10/2013   Procedure: LEFT INDEX A-1 PULLEY RELEASE;  Surgeon: Cammie Sickle, MD;  Location: [place];  Service: Orthopedics;  Laterality: Left;  . ULNAR NERVE TRANSPOSITION Left 11/10/2013   Procedure: ULNAR NERVE TRANSPOSITION;  Surgeon: Cammie Sickle, MD;  Location: [place];  Service: Orthopedics;  Laterality: Left;    There were no vitals filed for this visit.                       [place] Adult PT Treatment/Exercise - 07/14/16 0001      Knee/Hip Exercises:  Stretches   Gastroc Stretch 2 reps;30 seconds  on step with heels down     Knee/Hip Exercises: Aerobic   Stationary Bike Level 0 for ROM x 8 minutes  full ROM today     Knee/Hip Exercises: Standing   Heel Raises 20 reps;Both  on step through full motion   Forward Step Up Right;Hand Hold: 2;20 reps   Rebounder 3 ways 1 min each     Knee/Hip Exercises: Seated   Long Arc Quad Right;Strengthening;2 sets;10 reps   Long Arc Quad Weight 4 lbs.     Knee/Hip Exercises: Supine   Quad Sets Strengthening;Right;AAROM;10 reps  hold 5 sec with overpressure on end range   Short Arc [employer] Strengthening;Right;3 sets;10 reps   Short Arc Quad Sets Limitations 4# added     Modalities   Modalities Vasopneumatic     Vasopneumatic   Number Minutes Vasopneumatic  15 minutes   Vasopnuematic Location  Knee   Vasopneumatic Pressure Medium   Vasopneumatic Temperature  3 snowflakes     Manual Therapy   Manual Therapy Soft tissue mobilization;Passive ROM   Soft tissue mobilization scar massage; soft tissue work to right gastroc and knee and quads   Passive ROM right knee for flexion and extension  PT Short Term Goals - 07/14/16 [id number]      PT SHORT TERM GOAL #1   Title be independent in initial HEP   Time 4   Period Weeks   Status On-going     PT SHORT TERM GOAL #2   Title wean from walker to use of cane for home distances   Time 4   Period Weeks   Status On-going     PT SHORT TERM GOAL #3   Title demonstrate Rt knee A/ROM extension to lacking < or = to 10 degrees to normalize gait   Baseline -5 degrees PROM    Time 4   Period Weeks   Status On-going     PT SHORT TERM GOAL #5   Title improve endurance to walk for 20-25 minutes without need to rest   Time 4   Period Weeks   Status On-going           PT Long Term Goals - 07/09/16 0831      PT LONG TERM GOAL #1   Title be independent in advanced HEP   Time 8   Period Weeks   Status New     PT  LONG TERM GOAL #2   Title reduce FOTO to < or = to 49% limitation   Time 8   Status New     PT LONG TERM GOAL #3   Title wean from assistive device on level surface and demonstrate symmetry   Time 8   Period Weeks   Status New     PT LONG TERM GOAL #4   Title demonstrate 4+/5 Rt hip and knee strength to improve safety and endurance for gait in the community and return to work   Time 8   Period Weeks   Status New     PT LONG TERM GOAL #5   Title demonstrate Rt knee A/ROM lacking < or = to 5 degrees extension to 110 flexion to improve mobility without substitution   Time 8   Period Weeks   Status New     Additional Long Term Goals   Additional Long Term Goals Yes     PT LONG TERM GOAL #6   Title report < or 3/10 Rt knee pain with standing and walking   Time 8   Period Weeks   Status New               Plan - 07/14/16 [id number]    Clinical Impression Statement Pt entered clinic without device today.  Pt with Rt distal hamstring pain today and reports that it is waking him up at night.  Pt tolerates all exercise in the clinic and is making full revolutions on the bike today.  Pt will continue to benefit from skilled PT s/p TKA to reduce pain, edema, and improve strength, A/ROM and gait.     Rehab Potential Good   PT Frequency 3x / week   PT Duration 8 weeks   PT Treatment/Interventions ADLs/Self Care Home Management;Cryotherapy;Electrical Stimulation;Functional mobility training;Stair training;Gait training;Ultrasound;Moist Heat;Therapeutic activities;Therapeutic exercise;Neuromuscular re-education;Balance training;Patient/family education;Passive range of motion;Scar mobilization;Manual techniques;Taping;Vasopneumatic Device   PT Next Visit Plan edema management, A/ROM progression scar mobs/soft tissue to quad/hamstring/gastroc, strength, gait   Consulted and Agree with Plan of Care Patient      Patient will benefit from skilled therapeutic intervention in order to improve  the following deficits and impairments:  Abnormal gait, Decreased range of motion, Decreased endurance, Decreased activity tolerance, Pain, Decreased strength, Increased edema,  Decreased balance, Hypomobility, Impaired flexibility  Visit Diagnosis: Acute pain of right knee  Localized edema  Other abnormalities of gait and mobility  Muscle weakness (generalized)  Stiffness of right knee, not elsewhere classified     Problem List Patient Active Problem List   Diagnosis Date Noted  . Unilateral primary osteoarthritis, right knee 06/24/2016  . S/P total knee replacement using cement, right 06/24/2016  . HYPERLIPIDEMIA 01/18/2007  . HYPERTENSION 01/18/2007    Philip Winters, PT 07/14/16 8:44 AM  [facility] [address], [place], [place], [address] Phone: [phone]   Fax:  [phone]  Name: Philip Winters MRN: [id number] Date of Birth: [birth year]

## 2016-07-16 ENCOUNTER — Encounter: Payer: Self-pay | Admitting: Physical Therapy

## 2016-07-16 ENCOUNTER — Ambulatory Visit: Payer: 59 | Admitting: Physical Therapy

## 2016-07-16 DIAGNOSIS — M6281 Muscle weakness (generalized): Secondary | ICD-10-CM

## 2016-07-16 DIAGNOSIS — M25561 Pain in right knee: Secondary | ICD-10-CM | POA: Diagnosis not present

## 2016-07-16 DIAGNOSIS — R600 Localized edema: Secondary | ICD-10-CM

## 2016-07-16 DIAGNOSIS — R2689 Other abnormalities of gait and mobility: Secondary | ICD-10-CM

## 2016-07-16 DIAGNOSIS — M25661 Stiffness of right knee, not elsewhere classified: Secondary | ICD-10-CM

## 2016-07-16 NOTE — Patient Instructions (Addendum)
[employer]   [facility] [address], [place], [place] [address] Phone # [phone] Fax [phone]

## 2016-07-16 NOTE — Therapy (Signed)
[facility] [address] [place], [place], [address] Phone: [phone]   Fax:  [phone]  Physical Therapy Treatment  Patient Details  Name: Philip Winters MRN: [id number] Date of Birth: Sep 30, 1960 Referring Provider: Joni Fears, MD  Encounter Date: 07/16/2016      Philip Winters End of Session - 07/16/16 0845    Visit Number 4   Date for Philip Winters Re-Evaluation 09/03/16   Philip Winters Start Time 0800   Philip Winters Stop Time 0900   Philip Winters Time Calculation (min) 60 min   Activity Tolerance Patient tolerated treatment well   Behavior During Therapy [facility] for tasks assessed/performed      Past Medical History:  Diagnosis Date  . Allergy   . Arthritis   . GERD (gastroesophageal reflux disease)    occ  . Heart murmur    baby  . Hypertension   . Seasonal allergies     Past Surgical History:  Procedure Laterality Date  . CARPAL TUNNEL WITH CUBITAL TUNNEL Left 11/10/2013   Procedure: LEFT CARPAL TUNNEL RELEASE;  Surgeon: Cammie Sickle, MD;  Location: [place];  Service: Orthopedics;  Laterality: Left;  . COLONOSCOPY    . KNEE ARTHROSCOPY  [id number]   left and right  . NASAL SINUS SURGERY  05/2015  . TOTAL KNEE ARTHROPLASTY Right 06/24/2016   Procedure: TOTAL KNEE ARTHROPLASTY;  Surgeon: Garald Balding, MD;  Location: [place];  Service: Orthopedics;  Laterality: Right;  . TRIGGER FINGER RELEASE Left 11/10/2013   Procedure: LEFT INDEX A-1 PULLEY RELEASE;  Surgeon: Cammie Sickle, MD;  Location: [place];  Service: Orthopedics;  Laterality: Left;  . ULNAR NERVE TRANSPOSITION Left 11/10/2013   Procedure: ULNAR NERVE TRANSPOSITION;  Surgeon: Cammie Sickle, MD;  Location: [place];  Service: Orthopedics;  Laterality: Left;    There were no vitals filed for this visit.      Subjective Assessment - 07/16/16 0810    Subjective Having increased pain today.  I need my cane today.    Pertinent History Rt TKA 06/24/16   Limitations Sitting;Standing;Walking   How long can you sit comfortably? 30 minutes   How long can you stand comfortably? 15 minutes    How long can you walk comfortably? 15 minutes   Patient Stated Goals reduce pain, walk without device, improve Rt knee A/ROM, reduce edema   Currently in Pain? Yes   Pain Score 5    Pain Location Knee   Pain Orientation Right   Pain Descriptors / Indicators Sharp;Stabbing   Pain Type Surgical pain   Pain Onset 1 to 4 weeks ago   Pain Frequency Constant   Aggravating Factors  standing too long, bending knee, sitting too long   Pain Relieving Factors ice, rest/elevation   Multiple Pain Sites No                         [facility] Adult Philip Winters Treatment/Exercise - 07/16/16 0001      Knee/Hip Exercises: Stretches   Gastroc Stretch 2 reps;30 seconds  on step with heels down     Knee/Hip Exercises: Aerobic   Nustep level 2 only legs, 7 min seat #9     Knee/Hip Exercises: Standing   Heel Raises 20 reps;Both  on step through full motion   Terminal Knee Extension Limitations green band 20 times   Forward Step Up Right;2 sets;10 reps;Step Height: 6";Hand Hold: 2  steps against the wall   Wall Squat 10 reps;5 seconds  in painfree range   Rebounder 3 ways 1 min each     Knee/Hip Exercises: Seated   Long Arc Quad Right;Strengthening;2 sets;15 reps   Long Arc Quad Weight 4 lbs.     Knee/Hip Exercises: Supine   Quad Sets Strengthening;Right;AAROM;10 reps  hold 5 sec with overpressure on end range   Quad Sets Limitations with overpressure at end range     Modalities   Modalities Vasopneumatic     Vasopneumatic   Number Minutes Vasopneumatic  15 minutes   Vasopnuematic Location  Knee   Vasopneumatic Pressure Medium   Vasopneumatic Temperature  3 snowflakes     Manual Therapy   Manual Therapy Soft tissue mobilization;Passive ROM   Soft tissue mobilization scar massage; soft tissue work to right gastroc  and knee and quads   Passive ROM right knee for flexion and extension                Philip Winters Education - 07/16/16 0845    Education provided Yes   Education Details infromation to help with time away from work   [employer]) Educated Patient   Methods Explanation;Handout   Comprehension Verbalized understanding          Philip Winters Short Term Goals - 07/14/16 0806      Philip Winters SHORT TERM GOAL #1   Title be independent in initial HEP   Time 4   Period Weeks   Status On-going     Philip Winters SHORT TERM GOAL #2   Title wean from walker to use of cane for home distances   Time 4   Period Weeks   Status On-going     Philip Winters SHORT TERM GOAL #3   Title demonstrate Rt knee A/ROM extension to lacking < or = to 10 degrees to normalize gait   Baseline -5 degrees PROM    Time 4   Period Weeks   Status On-going     Philip Winters SHORT TERM GOAL #5   Title improve endurance to walk for 20-25 minutes without need to rest   Time 4   Period Weeks   Status On-going           Philip Winters Long Term Goals - 07/09/16 0831      Philip Winters LONG TERM GOAL #1   Title be independent in advanced HEP   Time 8   Period Weeks   Status New     Philip Winters LONG TERM GOAL #2   Title reduce FOTO to < or = to 49% limitation   Time 8   Status New     Philip Winters LONG TERM GOAL #3   Title wean from assistive device on level surface and demonstrate symmetry   Time 8   Period Weeks   Status New     Philip Winters LONG TERM GOAL #4   Title demonstrate 4+/5 Rt hip and knee strength to improve safety and endurance for gait in the community and return to work   Time 8   Period Weeks   Status New     Philip Winters LONG TERM GOAL #5   Title demonstrate Rt knee A/ROM lacking < or = to 5 degrees extension to 110 flexion to improve mobility without substitution   Time 8   Period Weeks   Status New     Additional Long Term Goals   Additional Long Term Goals Yes     Philip Winters LONG TERM GOAL #6   Title  report < or 3/10 Rt knee pain with standing and walking   Time 8   Period Weeks    Status New               Plan - 07/16/16 0845    Clinical Impression Statement Patient is able to do a quad set with superior movement of right patella.  Patient had increased pain today due to healing and is using his cane.  Patient is progressing well. Pateint will benefit from skilled therapy to increase right knee ROM and strength.    Rehab Potential Good   Clinical Impairments Affecting Rehab Potential None   Philip Winters Frequency 3x / week   Philip Winters Duration 8 weeks   Philip Winters Treatment/Interventions ADLs/Self Care Home Management;Cryotherapy;Electrical Stimulation;Functional mobility training;Stair training;Gait training;Ultrasound;Moist Heat;Therapeutic activities;Therapeutic exercise;Neuromuscular re-education;Balance training;Patient/family education;Passive range of motion;Scar mobilization;Manual techniques;Taping;Vasopneumatic Device   Philip Winters Next Visit Plan edema management, A/ROM progression scar mobs/soft tissue to quad/hamstring/gastroc, strength, gait; see when seeing doctor   Philip Winters Home Exercise Plan progress as needed   Consulted and Agree with Plan of Care Patient      Patient will benefit from skilled therapeutic intervention in order to improve the following deficits and impairments:  Abnormal gait, Decreased range of motion, Decreased endurance, Decreased activity tolerance, Pain, Decreased strength, Increased edema, Decreased balance, Hypomobility, Impaired flexibility  Visit Diagnosis: Acute pain of right knee  Localized edema  Other abnormalities of gait and mobility  Muscle weakness (generalized)  Stiffness of right knee, not elsewhere classified     Problem List Patient Active Problem List   Diagnosis Date Noted  . Unilateral primary osteoarthritis, right knee 06/24/2016  . S/P total knee replacement using cement, right 06/24/2016  . HYPERLIPIDEMIA 01/18/2007  . HYPERTENSION 01/18/2007    Philip Winters, Philip Winters 07/16/16 8:48 AM   [facility] [address] [place], [place], [address] Phone: [phone]   Fax:  [phone]  Name: Philip Winters MRN: [id number] Date of Birth: 24-Aug-1960

## 2016-07-18 ENCOUNTER — Encounter: Payer: Self-pay | Admitting: Physical Therapy

## 2016-07-18 ENCOUNTER — Ambulatory Visit: Payer: 59 | Attending: Orthopaedic Surgery | Admitting: Physical Therapy

## 2016-07-18 DIAGNOSIS — M25561 Pain in right knee: Secondary | ICD-10-CM | POA: Insufficient documentation

## 2016-07-18 DIAGNOSIS — M6281 Muscle weakness (generalized): Secondary | ICD-10-CM | POA: Insufficient documentation

## 2016-07-18 DIAGNOSIS — R600 Localized edema: Secondary | ICD-10-CM | POA: Diagnosis not present

## 2016-07-18 DIAGNOSIS — M25661 Stiffness of right knee, not elsewhere classified: Secondary | ICD-10-CM

## 2016-07-18 DIAGNOSIS — R2689 Other abnormalities of gait and mobility: Secondary | ICD-10-CM | POA: Diagnosis not present

## 2016-07-18 NOTE — Therapy (Signed)
[facility] [address] [place], [place], [address] Phone: [phone]   Fax:  [phone]  Physical Therapy Treatment  Patient Details  Name: Philip Winters MRN: [id number] Date of Birth: 17-May-1961 Referring Provider: Joni Fears, MD  Encounter Date: 07/18/2016      PT End of Session - 07/18/16 0803    Visit Number 5   Date for PT Re-Evaluation 09/03/16   PT Start Time 0800   Activity Tolerance Patient limited by pain   Behavior During Therapy [facility] for tasks assessed/performed      Past Medical History:  Diagnosis Date  . Allergy   . Arthritis   . GERD (gastroesophageal reflux disease)    occ  . Heart murmur    baby  . Hypertension   . Seasonal allergies     Past Surgical History:  Procedure Laterality Date  . CARPAL TUNNEL WITH CUBITAL TUNNEL Left 11/10/2013   Procedure: LEFT CARPAL TUNNEL RELEASE;  Surgeon: Cammie Sickle, MD;  Location: [place];  Service: Orthopedics;  Laterality: Left;  . COLONOSCOPY    . KNEE ARTHROSCOPY  [id number]   left and right  . NASAL SINUS SURGERY  05/2015  . TOTAL KNEE ARTHROPLASTY Right 06/24/2016   Procedure: TOTAL KNEE ARTHROPLASTY;  Surgeon: Garald Balding, MD;  Location: [place];  Service: Orthopedics;  Laterality: Right;  . TRIGGER FINGER RELEASE Left 11/10/2013   Procedure: LEFT INDEX A-1 PULLEY RELEASE;  Surgeon: Cammie Sickle, MD;  Location: [place];  Service: Orthopedics;  Laterality: Left;  . ULNAR NERVE TRANSPOSITION Left 11/10/2013   Procedure: ULNAR NERVE TRANSPOSITION;  Surgeon: Cammie Sickle, MD;  Location: [place];  Service: Orthopedics;  Laterality: Left;    There were no vitals filed for this visit.      Subjective Assessment - 07/18/16 0801    Subjective Another bad day yesterday, probably weather related.    Currently in Pain? Yes   Pain Score 6    Pain Location Knee   Pain  Descriptors / Indicators Sharp   Multiple Pain Sites No                         [facility] Adult PT Treatment/Exercise - 07/18/16 0001      Knee/Hip Exercises: Aerobic   Stationary Bike L1 x 6 min post manual work   Nustep L2 x 10 min  Verbal review of status concurrent     Vasopneumatic   Number Minutes Vasopneumatic  15 minutes   Vasopnuematic Location  Knee   Vasopneumatic Pressure High   Vasopneumatic Temperature  3 snowflakes     Manual Therapy   Manual Therapy Soft tissue mobilization;Passive ROM   Soft tissue mobilization scar massage; soft tissue work to right gastroc and knee and quads   Passive ROM right knee for flexion and extension                  PT Short Term Goals - 07/14/16 0806      PT SHORT TERM GOAL #1   Title be independent in initial HEP   Time 4   Period Weeks   Status On-going     PT SHORT TERM GOAL #2   Title wean from walker to use of cane for home distances   Time 4   Period Weeks   Status On-going     PT SHORT TERM  GOAL #3   Title demonstrate Rt knee A/ROM extension to lacking < or = to 10 degrees to normalize gait   Baseline -5 degrees PROM    Time 4   Period Weeks   Status On-going     PT SHORT TERM GOAL #5   Title improve endurance to walk for 20-25 minutes without need to rest   Time 4   Period Weeks   Status On-going           PT Long Term Goals - 07/09/16 0831      PT LONG TERM GOAL #1   Title be independent in advanced HEP   Time 8   Period Weeks   Status New     PT LONG TERM GOAL #2   Title reduce FOTO to < or = to 49% limitation   Time 8   Status New     PT LONG TERM GOAL #3   Title wean from assistive device on level surface and demonstrate symmetry   Time 8   Period Weeks   Status New     PT LONG TERM GOAL #4   Title demonstrate 4+/5 Rt hip and knee strength to improve safety and endurance for gait in the community and return to work   Time 8   Period Weeks   Status New      PT LONG TERM GOAL #5   Title demonstrate Rt knee A/ROM lacking < or = to 5 degrees extension to 110 flexion to improve mobility without substitution   Time 8   Period Weeks   Status New     Additional Long Term Goals   Additional Long Term Goals Yes     PT LONG TERM GOAL #6   Title report < or 3/10 Rt knee pain with standing and walking   Time 8   Period Weeks   Status New               Plan - 07/18/16 [id number]    Clinical Impression Statement Pt continues to be in more pain today. RT hamstrings, quads, ITband and peripateller fascia extremely tight  inhibiting his knee motion. Pt was able to spin the bike without compensations  after manual work.     Rehab Potential Good   Clinical Impairments Affecting Rehab Potential None   PT Frequency 3x / week   PT Duration 8 weeks   PT Next Visit Plan Manual work to knee, knee AROM, work to inhibit the hamstrings and gatroc.    Consulted and Agree with Plan of Care --      Patient will benefit from skilled therapeutic intervention in order to improve the following deficits and impairments:  Abnormal gait, Decreased range of motion, Decreased endurance, Decreased activity tolerance, Pain, Decreased strength, Increased edema, Decreased balance, Hypomobility, Impaired flexibility  Visit Diagnosis: Acute pain of right knee  Localized edema  Other abnormalities of gait and mobility  Muscle weakness (generalized)  Stiffness of right knee, not elsewhere classified     Problem List Patient Active Problem List   Diagnosis Date Noted  . Unilateral primary osteoarthritis, right knee 06/24/2016  . S/P total knee replacement using cement, right 06/24/2016  . HYPERLIPIDEMIA 01/18/2007  . HYPERTENSION 01/18/2007    Aina Rossbach, PTA 07/18/2016, 8:45 AM  [facility] [address] [place], [place], [address] Phone: [phone]   Fax:  [phone]  Name: Philip Winters MRN: [id number] Date of Birth: 1961/05/07

## 2016-07-21 ENCOUNTER — Ambulatory Visit: Payer: 59 | Admitting: Physical Therapy

## 2016-07-21 ENCOUNTER — Encounter: Payer: Self-pay | Admitting: Physical Therapy

## 2016-07-21 DIAGNOSIS — M25561 Pain in right knee: Secondary | ICD-10-CM | POA: Diagnosis not present

## 2016-07-21 DIAGNOSIS — M25661 Stiffness of right knee, not elsewhere classified: Secondary | ICD-10-CM

## 2016-07-21 DIAGNOSIS — M6281 Muscle weakness (generalized): Secondary | ICD-10-CM

## 2016-07-21 DIAGNOSIS — R2689 Other abnormalities of gait and mobility: Secondary | ICD-10-CM

## 2016-07-21 DIAGNOSIS — R600 Localized edema: Secondary | ICD-10-CM

## 2016-07-21 NOTE — Therapy (Signed)
[facility] [address] [place], [place], [address] Phone: [phone]   Fax:  [phone]  Physical Therapy Treatment  Patient Details  Name: Philip Winters MRN: [id number] Date of Birth: 1960-06-10 Referring Provider: Joni Fears, MD  Encounter Date: 07/21/2016      PT End of Session - 07/21/16 0832    Visit Number 6   Date for PT Re-Evaluation 09/03/16   PT Start Time 0803   PT Stop Time 0900   PT Time Calculation (min) 57 min   Activity Tolerance Patient limited by pain   Behavior During Therapy [facility] for tasks assessed/performed      Past Medical History:  Diagnosis Date  . Allergy   . Arthritis   . GERD (gastroesophageal reflux disease)    occ  . Heart murmur    baby  . Hypertension   . Seasonal allergies     Past Surgical History:  Procedure Laterality Date  . CARPAL TUNNEL WITH CUBITAL TUNNEL Left 11/10/2013   Procedure: LEFT CARPAL TUNNEL RELEASE;  Surgeon: Cammie Sickle, MD;  Location: [place];  Service: Orthopedics;  Laterality: Left;  . COLONOSCOPY    . KNEE ARTHROSCOPY  [id number]   left and right  . NASAL SINUS SURGERY  05/2015  . TOTAL KNEE ARTHROPLASTY Right 06/24/2016   Procedure: TOTAL KNEE ARTHROPLASTY;  Surgeon: Garald Balding, MD;  Location: [place];  Service: Orthopedics;  Laterality: Right;  . TRIGGER FINGER RELEASE Left 11/10/2013   Procedure: LEFT INDEX A-1 PULLEY RELEASE;  Surgeon: Cammie Sickle, MD;  Location: [place];  Service: Orthopedics;  Laterality: Left;  . ULNAR NERVE TRANSPOSITION Left 11/10/2013   Procedure: ULNAR NERVE TRANSPOSITION;  Surgeon: Cammie Sickle, MD;  Location: [place];  Service: Orthopedics;  Laterality: Left;    There were no vitals filed for this visit.      Subjective Assessment - 07/21/16 0806    Subjective I can't get sleep, it keeps me up and feels like there is loosness when I  turn over.  did a lot of walking this weekend   Pertinent History Rt TKA 06/24/16   Limitations Sitting;Standing;Walking   How long can you sit comfortably? 30 minutes   How long can you stand comfortably? 15 minutes    How long can you walk comfortably? 15 minutes   Patient Stated Goals reduce pain, walk without device, improve Rt knee A/ROM, reduce edema   Currently in Pain? Yes   Pain Score 3    Pain Location Knee   Pain Orientation Right   Pain Descriptors / Indicators Tightness   Pain Onset 1 to 4 weeks ago   Pain Frequency Constant   Aggravating Factors  walking, standing too long   Pain Relieving Factors ice, rest   Multiple Pain Sites No                         [facility] Adult PT Treatment/Exercise - 07/21/16 0001      Knee/Hip Exercises: Stretches   Gastroc Stretch 2 reps;30 seconds  on step with heels down     Knee/Hip Exercises: Aerobic   Stationary Bike L1 x 8 min   ROM, PT present to discuss plan of care     Knee/Hip Exercises: Machines for Strengthening   Cybex Leg Press 50# bilateral x 20; 30# single leg x 20     Knee/Hip  Exercises: Standing   Terminal Knee Extension Limitations red band 20 times x 5 sec hold   Lateral Step Up Right;15 reps;Step Height: 6";Hand Hold: 1  light hand hold   Forward Step Up Right;Step Height: 6";Hand Hold: 2;15 reps  light hand hold   Wall Squat 10 reps;5 seconds  in painfree range   Walking with Sports Cord 20# 4 ways - 5x each     Knee/Hip Exercises: Seated   Long Arc Quad Right;Strengthening;2 sets;15 reps   Long Arc Quad Weight 5 lbs.     Vasopneumatic   Number Minutes Vasopneumatic  15 minutes   Vasopnuematic Location  Knee   Vasopneumatic Pressure High   Vasopneumatic Temperature  3 snowflakes     Manual Therapy   Manual Therapy Soft tissue mobilization;Passive ROM   Soft tissue mobilization scar massage; soft tissue work to right gastroc and knee and quads   Passive ROM right knee for flexion and  extension                  PT Short Term Goals - 07/21/16 0809      PT SHORT TERM GOAL #1   Title be independent in initial HEP   Time 4   Period Weeks   Status On-going     PT SHORT TERM GOAL #2   Title wean from walker to use of cane for home distances   Time 4   Period Weeks   Status On-going           PT Long Term Goals - 07/09/16 0831      PT LONG TERM GOAL #1   Title be independent in advanced HEP   Time 8   Period Weeks   Status New     PT LONG TERM GOAL #2   Title reduce FOTO to < or = to 49% limitation   Time 8   Status New     PT LONG TERM GOAL #3   Title wean from assistive device on level surface and demonstrate symmetry   Time 8   Period Weeks   Status New     PT LONG TERM GOAL #4   Title demonstrate 4+/5 Rt hip and knee strength to improve safety and endurance for gait in the community and return to work   Time 8   Period Weeks   Status New     PT LONG TERM GOAL #5   Title demonstrate Rt knee A/ROM lacking < or = to 5 degrees extension to 110 flexion to improve mobility without substitution   Time 8   Period Weeks   Status New     Additional Long Term Goals   Additional Long Term Goals Yes     PT LONG TERM GOAL #6   Title report < or 3/10 Rt knee pain with standing and walking   Time 8   Period Weeks   Status New               Plan - 07/21/16 [id number]    Clinical Impression Statement Patient continues to have msucle spasming Rt hamstring and gastroc.  Patient able to to tolerate more exericses today and progressing with more resistance.  Still lacking full extension.  Continues to need PT for strengthening and ROM for functional activities.   Rehab Potential Good   Clinical Impairments Affecting Rehab Potential None   PT Frequency 3x / week   PT Duration 8 weeks   PT Treatment/Interventions ADLs/Self Care Home  Management;Cryotherapy;Electrical Stimulation;Functional mobility training;Stair training;Gait  training;Ultrasound;Moist Heat;Therapeutic activities;Therapeutic exercise;Neuromuscular re-education;Balance training;Patient/family education;Passive range of motion;Scar mobilization;Manual techniques;Taping;Vasopneumatic Device   PT Next Visit Plan Manual work to knee, knee AROM, work to inhibit the hamstrings and gatroc, LE strengthening as tolerated   Consulted and Agree with Plan of Care Patient      Patient will benefit from skilled therapeutic intervention in order to improve the following deficits and impairments:  Abnormal gait, Decreased range of motion, Decreased endurance, Decreased activity tolerance, Pain, Decreased strength, Increased edema, Decreased balance, Hypomobility, Impaired flexibility  Visit Diagnosis: Acute pain of right knee  Localized edema  Other abnormalities of gait and mobility  Muscle weakness (generalized)  Stiffness of right knee, not elsewhere classified     Problem List Patient Active Problem List   Diagnosis Date Noted  . Unilateral primary osteoarthritis, right knee 06/24/2016  . S/P total knee replacement using cement, right 06/24/2016  . HYPERLIPIDEMIA 01/18/2007  . HYPERTENSION 01/18/2007    Zannie Cove, PT 07/21/2016, 9:16 AM  [facility] [address] [place], [place], [address] Phone: [phone]   Fax:  [phone]  Name: Philip Winters MRN: [id number] Date of Birth: November 08, 1960

## 2016-07-23 ENCOUNTER — Ambulatory Visit: Payer: 59 | Admitting: Physical Therapy

## 2016-07-23 ENCOUNTER — Encounter: Payer: Self-pay | Admitting: Physical Therapy

## 2016-07-23 DIAGNOSIS — M25561 Pain in right knee: Secondary | ICD-10-CM | POA: Diagnosis not present

## 2016-07-23 DIAGNOSIS — M6281 Muscle weakness (generalized): Secondary | ICD-10-CM

## 2016-07-23 DIAGNOSIS — R600 Localized edema: Secondary | ICD-10-CM

## 2016-07-23 DIAGNOSIS — M25661 Stiffness of right knee, not elsewhere classified: Secondary | ICD-10-CM

## 2016-07-23 DIAGNOSIS — R2689 Other abnormalities of gait and mobility: Secondary | ICD-10-CM

## 2016-07-23 NOTE — Therapy (Signed)
[facility] [address] [place], [place], [address] Phone: [phone]   Fax:  [phone]  Physical Therapy Treatment  Patient Details  Name: Philip Winters MRN: [id number] Date of Birth: [birth year] Referring Provider: Joni Fears, MD  Encounter Date: 07/23/2016      PT End of Session - 07/23/16 0803    Visit Number 7   Date for PT Re-Evaluation 09/03/16   PT Start Time 0800   PT Stop Time 0165   PT Time Calculation (min) 54 min   Activity Tolerance Patient limited by pain   Behavior During Therapy [facility] for tasks assessed/performed      Past Medical History:  Diagnosis Date  . Allergy   . Arthritis   . GERD (gastroesophageal reflux disease)    occ  . Heart murmur    baby  . Hypertension   . Seasonal allergies     Past Surgical History:  Procedure Laterality Date  . CARPAL TUNNEL WITH CUBITAL TUNNEL Left 11/10/2013   Procedure: LEFT CARPAL TUNNEL RELEASE;  Surgeon: Cammie Sickle, MD;  Location: [place];  Service: Orthopedics;  Laterality: Left;  . COLONOSCOPY    . KNEE ARTHROSCOPY  [id number]   left and right  . NASAL SINUS SURGERY  05/2015  . TOTAL KNEE ARTHROPLASTY Right 06/24/2016   Procedure: TOTAL KNEE ARTHROPLASTY;  Surgeon: Garald Balding, MD;  Location: [place];  Service: Orthopedics;  Laterality: Right;  . TRIGGER FINGER RELEASE Left 11/10/2013   Procedure: LEFT INDEX A-1 PULLEY RELEASE;  Surgeon: Cammie Sickle, MD;  Location: [place];  Service: Orthopedics;  Laterality: Left;  . ULNAR NERVE TRANSPOSITION Left 11/10/2013   Procedure: ULNAR NERVE TRANSPOSITION;  Surgeon: Cammie Sickle, MD;  Location: [place];  Service: Orthopedics;  Laterality: Left;    There were no vitals filed for this visit.      Subjective Assessment - 07/23/16 0801    Subjective Today is painful.The weather sucks and that makes my knee feel worse.     Pertinent History Rt TKA 06/24/16   Limitations Sitting;Standing;Walking   How long can you sit comfortably? 30 minutes   How long can you stand comfortably? 15 minutes    How long can you walk comfortably? 15 minutes   Patient Stated Goals reduce pain, walk without device, improve Rt knee A/ROM, reduce edema   Currently in Pain? Yes   Pain Score 3    Pain Location Knee   Pain Orientation Right   Pain Descriptors / Indicators Tightness   Pain Type Surgical pain   Pain Onset 1 to 4 weeks ago   Pain Frequency Constant   Aggravating Factors  walking, standing too long   Pain Relieving Factors ice, rest   Multiple Pain Sites No                         [facility] Adult PT Treatment/Exercise - 07/23/16 0001      Neuro Re-ed    Neuro Re-ed Details  SLS with 3 direction hip kicking     Knee/Hip Exercises: Stretches   Passive Hamstring Stretch Both   Gastroc Stretch 2 reps;30 seconds  on step with heels down     Knee/Hip Exercises: Aerobic   Stationary Bike L1 x 6 min   ROM, PT present to discuss plan of care     Knee/Hip Exercises: Machines for Strengthening  Cybex Leg Press 70# bilateral x 20; 40# single leg x 20     Knee/Hip Exercises: Standing   Hip Flexion --   Hip Abduction --   Hip Extension --   Forward Step Up Right;Step Height: 6";Hand Hold: 2;15 reps  light hand hold   Wall Squat 10 reps;5 seconds  in painfree range     Modalities   Modalities Vasopneumatic     Vasopneumatic   Number Minutes Vasopneumatic  15 minutes   Vasopnuematic Location  Knee   Vasopneumatic Pressure High   Vasopneumatic Temperature  3 snowflakes                PT Education - 07/23/16 0842    Education provided Yes   Education Details Standing quad strength   Person(s) Educated Patient   Methods Explanation;Handout   Comprehension Verbalized understanding          PT Short Term Goals - 07/23/16 0803      PT SHORT TERM GOAL #1   Title be independent in  initial HEP   Time 4   Period Weeks   Status On-going     PT SHORT TERM GOAL #2   Title wean from walker to use of cane for home distances   Time 4   Period Weeks   Status Achieved     PT SHORT TERM GOAL #3   Title demonstrate Rt knee A/ROM extension to lacking < or = to 10 degrees to normalize gait   Time 4   Period Weeks   Status On-going     PT SHORT TERM GOAL #4   Title demonstrate Rt quad strength > or = to 4/5 without lag to improve Rt knee stability   Time 4   Period Weeks   Status On-going     PT SHORT TERM GOAL #5   Title improve endurance to walk for 20-25 minutes without need to rest   Time 4   Period Weeks   Status On-going           PT Long Term Goals - 07/23/16 2694      PT LONG TERM GOAL #1   Title be independent in advanced HEP   Time 8   Period Weeks   Status On-going     PT LONG TERM GOAL #2   Title reduce FOTO to < or = to 49% limitation   Time 8   Period Weeks   Status On-going     PT LONG TERM GOAL #3   Title wean from assistive device on level surface and demonstrate symmetry   Time 8   Period Weeks   Status On-going     PT LONG TERM GOAL #4   Title demonstrate 4+/5 Rt hip and knee strength to improve safety and endurance for gait in the community and return to work   Time 8   Period Weeks   Status On-going     PT LONG TERM GOAL #5   Title demonstrate Rt knee A/ROM lacking < or = to 5 degrees extension to 110 flexion to improve mobility without substitution   Time 8   Period Weeks   Status On-going     PT LONG TERM GOAL #6   Title report < or 3/10 Rt knee pain with standing and walking   Time 8   Period Weeks   Status On-going               Plan - 07/23/16 8546  Clinical Impression Statement Pt continues to have decreased quad strength and knee stability. Tightness in hamstring. Able to tolerate all exercises well with quad activation exercises. Pt has met short term goal for ambulating without use of an  assistive device. Pt stated he does keep his cane in the car just in case. Pt will continue to benefit from skiled therapy for LE strength and stability.    Rehab Potential Good   Clinical Impairments Affecting Rehab Potential None   PT Frequency 3x / week   PT Duration 8 weeks   PT Treatment/Interventions ADLs/Self Care Home Management;Cryotherapy;Electrical Stimulation;Functional mobility training;Stair training;Gait training;Ultrasound;Moist Heat;Therapeutic activities;Therapeutic exercise;Neuromuscular re-education;Balance training;Patient/family education;Passive range of motion;Scar mobilization;Manual techniques;Taping;Vasopneumatic Device   PT Next Visit Plan quad activation and knee stability, soft tissue work as needed   Oncologist with Plan of Care Patient      Patient will benefit from skilled therapeutic intervention in order to improve the following deficits and impairments:  Abnormal gait, Decreased range of motion, Decreased endurance, Decreased activity tolerance, Pain, Decreased strength, Increased edema, Decreased balance, Hypomobility, Impaired flexibility  Visit Diagnosis: Acute pain of right knee  Localized edema  Other abnormalities of gait and mobility  Muscle weakness (generalized)  Stiffness of right knee, not elsewhere classified     Problem List Patient Active Problem List   Diagnosis Date Noted  . Unilateral primary osteoarthritis, right knee 06/24/2016  . S/P total knee replacement using cement, right 06/24/2016  . HYPERLIPIDEMIA 01/18/2007  . HYPERTENSION 01/18/2007    Mikle Bosworth PTA 07/23/2016, 8:53 AM  [facility] [address] [place], [place], [address] Phone: [phone]   Fax:  [phone]  Name: TRAMELL PIECHOTA MRN: [id number] Date of Birth: 1960-10-08

## 2016-07-23 NOTE — Patient Instructions (Signed)
ABDUCTION: Standing - Resistance Band (Active)   Stand, feet flat. Against yellow resistance band, lift right leg out to side. Complete _2__ sets of _20__ repetitions. Perform _3__ sessions per week.  Strengthening: Hip Flexion - Resisted   With tubing around left ankle, anchor behind, bring leg forward, keeping knee straight. Repeat _20___ times per set. Do _2___ sets per session. Do __3__ sessions per week.  Strengthening: Hip Extension - Resisted   With tubing around right ankle, face anchor and pull leg straight back. Repeat __20__ times per set. Do __2__ sets per session. Do __3__ sessions per week.  Mikle Bosworth, PTA 07/23/16 8:41 AM  [facility] [address], [place], [place] [address] Phone # [phone] Fax [phone]

## 2016-07-25 ENCOUNTER — Ambulatory Visit: Payer: 59 | Admitting: Physical Therapy

## 2016-07-25 DIAGNOSIS — M25661 Stiffness of right knee, not elsewhere classified: Secondary | ICD-10-CM

## 2016-07-25 DIAGNOSIS — R2689 Other abnormalities of gait and mobility: Secondary | ICD-10-CM

## 2016-07-25 DIAGNOSIS — M25561 Pain in right knee: Secondary | ICD-10-CM | POA: Diagnosis not present

## 2016-07-25 DIAGNOSIS — R600 Localized edema: Secondary | ICD-10-CM

## 2016-07-25 DIAGNOSIS — M6281 Muscle weakness (generalized): Secondary | ICD-10-CM

## 2016-07-25 NOTE — Therapy (Signed)
[facility] [address] [place], [place], [address] Phone: [phone]   Fax:  [phone]  Physical Therapy Treatment  Patient Details  Name: Philip Winters MRN: [id number] Date of Birth: March 19, 1961 Referring Provider: Joni Fears, MD  Encounter Date: 07/25/2016      PT End of Session - 07/25/16 1146    Visit Number 8   Date for PT Re-Evaluation 09/03/16   PT Start Time 1101   PT Stop Time 1155   PT Time Calculation (min) 54 min   Activity Tolerance Patient limited by pain   Behavior During Therapy [facility] for tasks assessed/performed      Past Medical History:  Diagnosis Date  . Allergy   . Arthritis   . GERD (gastroesophageal reflux disease)    occ  . Heart murmur    baby  . Hypertension   . Seasonal allergies     Past Surgical History:  Procedure Laterality Date  . CARPAL TUNNEL WITH CUBITAL TUNNEL Left 11/10/2013   Procedure: LEFT CARPAL TUNNEL RELEASE;  Surgeon: Cammie Sickle, MD;  Location: [place];  Service: Orthopedics;  Laterality: Left;  . COLONOSCOPY    . KNEE ARTHROSCOPY  [id number]   left and right  . NASAL SINUS SURGERY  05/2015  . TOTAL KNEE ARTHROPLASTY Right 06/24/2016   Procedure: TOTAL KNEE ARTHROPLASTY;  Surgeon: Garald Balding, MD;  Location: [place];  Service: Orthopedics;  Laterality: Right;  . TRIGGER FINGER RELEASE Left 11/10/2013   Procedure: LEFT INDEX A-1 PULLEY RELEASE;  Surgeon: Cammie Sickle, MD;  Location: [place];  Service: Orthopedics;  Laterality: Left;  . ULNAR NERVE TRANSPOSITION Left 11/10/2013   Procedure: ULNAR NERVE TRANSPOSITION;  Surgeon: Cammie Sickle, MD;  Location: [place];  Service: Orthopedics;  Laterality: Left;    There were no vitals filed for this visit.      Subjective Assessment - 07/25/16 1107    Subjective Feels like my knee is getting stuck and I have to swing it out when I'm  walking to get it to straighten.     Limitations Sitting;Standing;Walking   Patient Stated Goals reduce pain, walk without device, improve Rt knee A/ROM, reduce edema   Currently in Pain? Yes   Pain Score 4    Pain Location Knee   Pain Orientation Right   Pain Descriptors / Indicators Tightness   Pain Type Surgical pain   Pain Onset 1 to 4 weeks ago   Pain Frequency Constant   Aggravating Factors  walking fast   Pain Relieving Factors ice, heat, rest   Multiple Pain Sites No            [facility] PT Assessment - 07/25/16 0001      PROM   Overall PROM Comments -5 to 105 degrees                     [facility] Adult PT Treatment/Exercise - 07/25/16 0001      Knee/Hip Exercises: Aerobic   Stationary Bike L1 x 6 min   ROM, PT present to discuss plan of care     Knee/Hip Exercises: Supine   Quad Sets Strengthening;Right;10 reps  5 sec hold   Short Arc Quad Sets Strengthening;Right;20 reps   Short Arc Quad Sets Limitations 5#     Cryotherapy   Number Minutes Cryotherapy 15 Minutes   Cryotherapy Location Knee   Type of Cryotherapy  Ice pack     Leisure centre manager [employer]   Electrical Stimulation Parameters 15 min to tolerance   Electrical Stimulation Goals Pain     Manual Therapy   Manual Therapy Soft tissue mobilization;Passive ROM;Joint mobilization   Joint Mobilization patellar mobs all directions; distraction; P/A mobs for extension   Soft tissue mobilization scar massage; soft tissue work to right gastroc and knee and quads   Passive ROM right knee for extension                  PT Short Term Goals - 07/23/16 0803      PT SHORT TERM GOAL #1   Title be independent in initial HEP   Time 4   Period Weeks   Status On-going     PT SHORT TERM GOAL #2   Title wean from walker to use of cane for home distances   Time 4   Period Weeks   Status Achieved     PT SHORT TERM GOAL  #3   Title demonstrate Rt knee A/ROM extension to lacking < or = to 10 degrees to normalize gait   Time 4   Period Weeks   Status On-going     PT SHORT TERM GOAL #4   Title demonstrate Rt quad strength > or = to 4/5 without lag to improve Rt knee stability   Time 4   Period Weeks   Status On-going     PT SHORT TERM GOAL #5   Title improve endurance to walk for 20-25 minutes without need to rest   Time 4   Period Weeks   Status On-going           PT Long Term Goals - 07/23/16 0804      PT LONG TERM GOAL #1   Title be independent in advanced HEP   Time 8   Period Weeks   Status On-going     PT LONG TERM GOAL #2   Title reduce FOTO to < or = to 49% limitation   Time 8   Period Weeks   Status On-going     PT LONG TERM GOAL #3   Title wean from assistive device on level surface and demonstrate symmetry   Time 8   Period Weeks   Status On-going     PT LONG TERM GOAL #4   Title demonstrate 4+/5 Rt hip and knee strength to improve safety and endurance for gait in the community and return to work   Time 8   Period Weeks   Status On-going     PT LONG TERM GOAL #5   Title demonstrate Rt knee A/ROM lacking < or = to 5 degrees extension to 110 flexion to improve mobility without substitution   Time 8   Period Weeks   Status On-going     PT LONG TERM GOAL #6   Title report < or 3/10 Rt knee pain with standing and walking   Time 8   Period Weeks   Status On-going               Plan - 07/25/16 1147    Clinical Impression Statement Patient continues to have increased swelling and stiffness in right knee.  Currently lacking 5 degrees ROM for knee extension, and is able to get 105 degrees of flexion.  Patient is limited with walking due to sharp pain in posterior right knee which is causing  knee to "lock".  Patient has muscle spasms in hamstring and gastroc that are increased with pain.  Patient continues to need skilled PT for progressing ROM and strength in right  knee so patient can return to full functional activities.   Rehab Potential Good   PT Treatment/Interventions ADLs/Self Care Home Management;Cryotherapy;Electrical Stimulation;Functional mobility training;Stair training;Gait training;Ultrasound;Moist Heat;Therapeutic activities;Therapeutic exercise;Neuromuscular re-education;Balance training;Patient/family education;Passive range of motion;Scar mobilization;Manual techniques;Taping;Vasopneumatic Device   PT Next Visit Plan quad activation and knee stability, soft tissue work as needed   PT Home Exercise Plan progress as needed   Consulted and Agree with Plan of Care Patient      Patient will benefit from skilled therapeutic intervention in order to improve the following deficits and impairments:  Abnormal gait, Decreased range of motion, Decreased endurance, Decreased activity tolerance, Pain, Decreased strength, Increased edema, Decreased balance, Hypomobility, Impaired flexibility  Visit Diagnosis: Acute pain of right knee  Localized edema  Other abnormalities of gait and mobility  Muscle weakness (generalized)  Stiffness of right knee, not elsewhere classified     Problem List Patient Active Problem List   Diagnosis Date Noted  . Unilateral primary osteoarthritis, right knee 06/24/2016  . S/P total knee replacement using cement, right 06/24/2016  . HYPERLIPIDEMIA 01/18/2007  . HYPERTENSION 01/18/2007    Zannie Cove, PT 07/25/2016, 5:10 PM  [facility] [address], [place], [place], [address] Phone: [phone]   Fax:  [phone]  Name: Philip Winters MRN: [id number] Date of Birth: 07/08/1960

## 2016-07-28 ENCOUNTER — Encounter (INDEPENDENT_AMBULATORY_CARE_PROVIDER_SITE_OTHER): Payer: Self-pay | Admitting: Orthopaedic Surgery

## 2016-07-28 ENCOUNTER — Ambulatory Visit (INDEPENDENT_AMBULATORY_CARE_PROVIDER_SITE_OTHER): Payer: 59 | Admitting: Orthopaedic Surgery

## 2016-07-28 VITALS — BP 139/85 | HR 120 | Ht 69.0 in | Wt 165.0 lb

## 2016-07-28 DIAGNOSIS — Z96651 Presence of right artificial knee joint: Secondary | ICD-10-CM

## 2016-07-28 NOTE — Progress Notes (Signed)
Office Visit Note   Patient: Philip Winters           Date of Birth: 30-Dec-1960           MRN: [id number] Visit Date: 07/28/2016              Requested by: Antony Contras, MD [place], [place] [address] PCP: Gara Kroner, MD   Assessment & Plan: Visit Diagnoses: Weeks status post primary right total knee replacement-doing well   Plan: Continue with physical therapy and home exercises. Okay to drive office 1 month  Follow-Up Instructions: No Follow-up on file.   Orders:  No orders of the defined types were placed in this encounter.  No orders of the defined types were placed in this encounter.     Procedures: No procedures performed   Clinical Data: No additional findings.   Subjective: No chief complaint on file.   Pt 1 month status post right total knee replacement. Pt states his knee feels "loose" and is "popping".  Pt in PT 3 x weeks.  Patient denies fever or chills shortness of breath or chest pain. He has not smoked in 3 months. He feels very good about his progressive increase in function  Review of Systems   Objective: Vital Signs: There were no vitals taken for this visit.  Physical Exam  Ortho Exam right knee exam with well-healed incision. No effusion. Lacks just a few degrees to full extension but flexes well over 100. No instability with a varus or valgus stress. Negative anterior drawer sign. No lower extremity swelling. Neurovascular exam intact.  Specialty Comments:  No specialty comments available.  Imaging: No results found.   PMFS History: Patient Active Problem List   Diagnosis Date Noted  . Unilateral primary osteoarthritis, right knee 06/24/2016  . S/P total knee replacement using cement, right 06/24/2016  . HYPERLIPIDEMIA 01/18/2007  . HYPERTENSION 01/18/2007   Past Medical History:  Diagnosis Date  . Allergy   . Arthritis   . GERD (gastroesophageal reflux disease)    occ  . Heart murmur    baby    . Hypertension   . Seasonal allergies     No family history on file.  Past Surgical History:  Procedure Laterality Date  . CARPAL TUNNEL WITH CUBITAL TUNNEL Left 11/10/2013   Procedure: LEFT CARPAL TUNNEL RELEASE;  Surgeon: Cammie Sickle, MD;  Location: [facility];  Service: Orthopedics;  Laterality: Left;  . COLONOSCOPY    . KNEE ARTHROSCOPY  [id number]   left and right  . NASAL SINUS SURGERY  05/2015  . TOTAL KNEE ARTHROPLASTY Right 06/24/2016   Procedure: TOTAL KNEE ARTHROPLASTY;  Surgeon: Garald Balding, MD;  Location: [facility];  Service: Orthopedics;  Laterality: Right;  . TRIGGER FINGER RELEASE Left 11/10/2013   Procedure: LEFT INDEX A-1 PULLEY RELEASE;  Surgeon: Cammie Sickle, MD;  Location: [place];  Service: Orthopedics;  Laterality: Left;  . ULNAR NERVE TRANSPOSITION Left 11/10/2013   Procedure: ULNAR NERVE TRANSPOSITION;  Surgeon: Cammie Sickle, MD;  Location: [facility];  Service: Orthopedics;  Laterality: Left;   Social History   Occupational History  . Not on file.   Social History Main Topics  . Smoking status: Former Smoker    Packs/day: 1.00    Years: 37.00    Types: Cigarettes    Quit date: 05/04/2016  . Smokeless tobacco: Never Used  . Alcohol use  0.0 oz/week     Comment: occ  . Drug use: No  . Sexual activity: Not on file

## 2016-07-29 ENCOUNTER — Encounter: Payer: 59 | Admitting: Physical Therapy

## 2016-07-30 ENCOUNTER — Ambulatory Visit: Payer: 59 | Admitting: Physical Therapy

## 2016-07-30 ENCOUNTER — Encounter: Payer: Self-pay | Admitting: Physical Therapy

## 2016-07-30 DIAGNOSIS — R600 Localized edema: Secondary | ICD-10-CM

## 2016-07-30 DIAGNOSIS — M25561 Pain in right knee: Secondary | ICD-10-CM | POA: Diagnosis not present

## 2016-07-30 DIAGNOSIS — R2689 Other abnormalities of gait and mobility: Secondary | ICD-10-CM

## 2016-07-30 DIAGNOSIS — M6281 Muscle weakness (generalized): Secondary | ICD-10-CM

## 2016-07-30 DIAGNOSIS — M25661 Stiffness of right knee, not elsewhere classified: Secondary | ICD-10-CM

## 2016-07-30 NOTE — Patient Instructions (Signed)
ABDUCTION: Standing - Resistance Band (Active)   Stand, feet flat. Against yellow resistance band, lift right leg out to side. Complete _2__ sets of _10__ repetitions. Perform _2__ sessions per week.  ADDUCTION: Standing - Stable: Resistance Band (Active)   Stand, right leg out to side as far as possible. Against yellow resistance band, draw leg in across midline. Complete _2__ sets of _10__ repetitions. Perform _2__ sessions per week.  Strengthening: Hip Flexion - Resisted   With tubing around left ankle, anchor behind, bring leg forward, keeping knee straight. Repeat _10__ times per set. Do ___2_ sets per session. Do __2__ sessions per week.  Strengthening: Hip Extension - Resisted   With tubing around right ankle, face anchor and pull leg straight back. Repeat __10__ times per set. Do __2__ sets per session. Do __2__ sessions per week.  Mikle Bosworth, PTA 07/30/16 8:15 AM  [facility] [address] [place], [place] [address] Phone # [phone] Fax [phone]

## 2016-07-30 NOTE — Therapy (Signed)
[facility] [address], [place], [place], [address] Phone: [phone]   Fax:  [phone]  Physical Therapy Treatment  Patient Details  Name: Philip Winters MRN: [id number] Date of Birth: Mar 10, 1961 Referring Provider: Joni Fears, MD  Encounter Date: 07/30/2016      PT End of Session - 07/30/16 0801    Visit Number 9   Date for PT Re-Evaluation 09/03/16   PT Start Time 0758   PT Stop Time 0844   PT Time Calculation (min) 46 min   Activity Tolerance Patient limited by pain   Behavior During Therapy [facility] for tasks assessed/performed      Past Medical History:  Diagnosis Date  . Allergy   . Arthritis   . GERD (gastroesophageal reflux disease)    occ  . Heart murmur    baby  . Hypertension   . Seasonal allergies     Past Surgical History:  Procedure Laterality Date  . CARPAL TUNNEL WITH CUBITAL TUNNEL Left 11/10/2013   Procedure: LEFT CARPAL TUNNEL RELEASE;  Surgeon: Cammie Sickle, MD;  Location: [place];  Service: Orthopedics;  Laterality: Left;  . COLONOSCOPY    . KNEE ARTHROSCOPY  [id number]   left and right  . NASAL SINUS SURGERY  05/2015  . TOTAL KNEE ARTHROPLASTY Right 06/24/2016   Procedure: TOTAL KNEE ARTHROPLASTY;  Surgeon: Garald Balding, MD;  Location: [place];  Service: Orthopedics;  Laterality: Right;  . TRIGGER FINGER RELEASE Left 11/10/2013   Procedure: LEFT INDEX A-1 PULLEY RELEASE;  Surgeon: Cammie Sickle, MD;  Location: [facility];  Service: Orthopedics;  Laterality: Left;  . ULNAR NERVE TRANSPOSITION Left 11/10/2013   Procedure: ULNAR NERVE TRANSPOSITION;  Surgeon: Cammie Sickle, MD;  Location: [place];  Service: Orthopedics;  Laterality: Left;    There were no vitals filed for this visit.      Subjective Assessment - 07/30/16 0800    Subjective Contineus to have pain in back of the knee and difficulty with extension    Pertinent History Rt TKA 06/24/16   Limitations Sitting;Standing;Walking   How long can you sit comfortably? 30 minutes   How long can you stand comfortably? 15 minutes    How long can you walk comfortably? 15 minutes   Patient Stated Goals reduce pain, walk without device, improve Rt knee A/ROM, reduce edema   Currently in Pain? Yes   Pain Score 3    Pain Location Knee   Pain Orientation Right   Pain Descriptors / Indicators Tightness   Pain Type Surgical pain   Pain Onset 1 to 4 weeks ago   Pain Frequency Constant   Aggravating Factors  walking fast   Pain Relieving Factors ice, heat, rest   Multiple Pain Sites No                         [facility] Adult PT Treatment/Exercise - 07/30/16 0001      Neuro Re-ed    Neuro Re-ed Details  SLS with 3 direction hip kicking  Red band on Lt foot     Knee/Hip Exercises: Stretches   Passive Hamstring Stretch Right;60 seconds  Ankle propper weight on knee   Gastroc Stretch 2 reps;30 seconds  on step with heels down     Knee/Hip Exercises: Aerobic   Stationary Bike L1 x 6 min   ROM, PT present to discuss plan  of care     Knee/Hip Exercises: Machines for Strengthening   Cybex Leg Press 90# bilateral x 20; 50# single leg x 20  Seat 6     Knee/Hip Exercises: Standing   Terminal Knee Extension Limitations Bal behind knee at wall x20   Walking with Sports Cord 35# front, #30 back,  - 6x each     Knee/Hip Exercises: Seated   Heel Slides --  Ankle plantar flexion x20 Black                PT Education - 07/30/16 0816    Education provided Yes   Education Details Standing balance   Person(s) Educated Patient   Methods Handout;Explanation   Comprehension Verbalized understanding          PT Short Term Goals - 07/30/16 0802      PT SHORT TERM GOAL #1   Title be independent in initial HEP   Time 4   Period Weeks   Status Achieved     PT SHORT TERM GOAL #2   Title wean from walker to use of cane for  home distances   Time 4   Period Weeks   Status Achieved     PT SHORT TERM GOAL #3   Title demonstrate Rt knee A/ROM extension to lacking < or = to 10 degrees to normalize gait   Time 4   Period Weeks   Status On-going           PT Long Term Goals - 07/30/16 0802      PT LONG TERM GOAL #1   Title be independent in advanced HEP   Time 8   Period Weeks   Status On-going     PT LONG TERM GOAL #2   Title reduce FOTO to < or = to 49% limitation   Time 8   Period Weeks   Status On-going               Plan - 07/30/16 0816    Clinical Impression Statement Pt continues with limited knee extension and tightness bihind knee. Able to tolerate all exercisese well, some difficulty with standing balance exercises. Pt has decrease calf strength in Rt LE, unable to perform calf raises. Declined modalities today. Will continue to focus on balance and knee stability as well and extension ROM.    Rehab Potential Good   Clinical Impairments Affecting Rehab Potential None   PT Frequency 3x / week   PT Duration 8 weeks   PT Treatment/Interventions ADLs/Self Care Home Management;Cryotherapy;Electrical Stimulation;Functional mobility training;Stair training;Gait training;Ultrasound;Moist Heat;Therapeutic activities;Therapeutic exercise;Neuromuscular re-education;Balance training;Patient/family education;Passive range of motion;Scar mobilization;Manual techniques;Taping;Vasopneumatic Device   PT Next Visit Plan Balanace, knee stability, soft tissue work as needed   Oncologist with Plan of Care Patient      Patient will benefit from skilled therapeutic intervention in order to improve the following deficits and impairments:  Abnormal gait, Decreased range of motion, Decreased endurance, Decreased activity tolerance, Pain, Decreased strength, Increased edema, Decreased balance, Hypomobility, Impaired flexibility  Visit Diagnosis: Acute pain of right knee  Localized edema  Other  abnormalities of gait and mobility  Muscle weakness (generalized)  Stiffness of right knee, not elsewhere classified     Problem List Patient Active Problem List   Diagnosis Date Noted  . Unilateral primary osteoarthritis, right knee 06/24/2016  . S/P total knee replacement using cement, right 06/24/2016  . HYPERLIPIDEMIA 01/18/2007  . HYPERTENSION 01/18/2007    Mikle Bosworth PTA 07/30/2016,  8:50 AM  [facility] [address], [place], [place], [address] Phone: [phone]   Fax:  [phone]  Name: Philip Winters MRN: [id number] Date of Birth: Nov 26, 1960

## 2016-08-01 ENCOUNTER — Ambulatory Visit: Payer: 59 | Admitting: Physical Therapy

## 2016-08-01 ENCOUNTER — Encounter: Payer: Self-pay | Admitting: Physical Therapy

## 2016-08-01 DIAGNOSIS — M25561 Pain in right knee: Secondary | ICD-10-CM

## 2016-08-01 DIAGNOSIS — R600 Localized edema: Secondary | ICD-10-CM

## 2016-08-01 DIAGNOSIS — M25661 Stiffness of right knee, not elsewhere classified: Secondary | ICD-10-CM

## 2016-08-01 DIAGNOSIS — M6281 Muscle weakness (generalized): Secondary | ICD-10-CM

## 2016-08-01 DIAGNOSIS — R2689 Other abnormalities of gait and mobility: Secondary | ICD-10-CM

## 2016-08-01 NOTE — Therapy (Signed)
[facility] [address] [place], [place], [address] Phone: [phone]   Fax:  [phone]  Physical Therapy Treatment  Patient Details  Name: Philip Winters MRN: [id number] Date of Birth: [birth year] Referring Provider: Joni Fears, MD  Encounter Date: 08/01/2016      PT End of Session - 08/01/16 0802    Visit Number 10   Date for PT Re-Evaluation 09/03/16   PT Start Time 0802   PT Stop Time 0905   PT Time Calculation (min) 63 min   Activity Tolerance Patient tolerated treatment well   Behavior During Therapy [facility] for tasks assessed/performed      Past Medical History:  Diagnosis Date  . Allergy   . Arthritis   . GERD (gastroesophageal reflux disease)    occ  . Heart murmur    baby  . Hypertension   . Seasonal allergies     Past Surgical History:  Procedure Laterality Date  . CARPAL TUNNEL WITH CUBITAL TUNNEL Left 11/10/2013   Procedure: LEFT CARPAL TUNNEL RELEASE;  Surgeon: Cammie Sickle, MD;  Location: [place];  Service: Orthopedics;  Laterality: Left;  . COLONOSCOPY    . KNEE ARTHROSCOPY  [id number]   left and right  . NASAL SINUS SURGERY  05/2015  . TOTAL KNEE ARTHROPLASTY Right 06/24/2016   Procedure: TOTAL KNEE ARTHROPLASTY;  Surgeon: Garald Balding, MD;  Location: [place];  Service: Orthopedics;  Laterality: Right;  . TRIGGER FINGER RELEASE Left 11/10/2013   Procedure: LEFT INDEX A-1 PULLEY RELEASE;  Surgeon: Cammie Sickle, MD;  Location: [place];  Service: Orthopedics;  Laterality: Left;  . ULNAR NERVE TRANSPOSITION Left 11/10/2013   Procedure: ULNAR NERVE TRANSPOSITION;  Surgeon: Cammie Sickle, MD;  Location: [facility];  Service: Orthopedics;  Laterality: Left;    There were no vitals filed for this visit.      Subjective Assessment - 08/01/16 0801    Subjective My hips were and are still sore from last session, knee deep  ache.    Currently in Pain? Yes   Pain Score 3    Pain Location Knee   Pain Orientation Right   Pain Descriptors / Indicators Sore   Aggravating Factors  Being on my feet for long times   Pain Relieving Factors heat, meds   Multiple Pain Sites No                         [facility] Adult PT Treatment/Exercise - 08/01/16 0001      Knee/Hip Exercises: Stretches   Gastroc Stretch Right;3 reps;20 seconds  On slant board     Knee/Hip Exercises: Aerobic   Stationary Bike L3 x 8 min  PTA present   Elliptical L2 x 4 min  PTA present     Knee/Hip Exercises: Machines for Strengthening   Cybex Leg Press Seat 6 Bil 90#10x, 100# 2x10, RTLE 50# 3x10   VC for ankle dorsiflexion at knee extension     Knee/Hip Exercises: Standing   Forward Step Up --  2x16 1 finger support 6"box   Rebounder weight shifting 1 min 3 ways     Moist Heat Therapy   Number Minutes Moist Heat 15 Minutes   Moist Heat Location --  RT knee     Electrical Stimulation   Electrical Stimulation Location knee   Electrical Stimulation Action IFC   Electrical Stimulation Parameters 15  min hooklying   Electrical Stimulation Goals Pain     Manual Therapy   Manual Therapy Soft tissue mobilization;Passive ROM;Joint mobilization   Joint Mobilization patellar mobs all directions; distraction; P/A mobs for extension   Soft tissue mobilization scar massage; soft tissue work to right gastroc and knee and quads   Passive ROM right knee for extension                  PT Short Term Goals - 07/30/16 0802      PT SHORT TERM GOAL #1   Title be independent in initial HEP   Time 4   Period Weeks   Status Achieved     PT SHORT TERM GOAL #2   Title wean from walker to use of cane for home distances   Time 4   Period Weeks   Status Achieved     PT SHORT TERM GOAL #3   Title demonstrate Rt knee A/ROM extension to lacking < or = to 10 degrees to normalize gait   Time 4   Period Weeks   Status  On-going           PT Long Term Goals - 07/30/16 0802      PT LONG TERM GOAL #1   Title be independent in advanced HEP   Time 8   Period Weeks   Status On-going     PT LONG TERM GOAL #2   Title reduce FOTO to < or = to 49% limitation   Time 8   Period Weeks   Status On-going               Plan - 08/01/16 0802    Clinical Impression Statement Pt was sore and stiff throughout the sesison today. Changed up modalities at the end of the sesison to heat and estim to see if that would help hamstring relax.  Soft tissues peripatella remain tight but improving.    Rehab Potential Good   Clinical Impairments Affecting Rehab Potential None   PT Frequency 3x / week   PT Duration 8 weeks   PT Treatment/Interventions ADLs/Self Care Home Management;Cryotherapy;Electrical Stimulation;Functional mobility training;Stair training;Gait training;Ultrasound;Moist Heat;Therapeutic activities;Therapeutic exercise;Neuromuscular re-education;Balance training;Patient/family education;Passive range of motion;Scar mobilization;Manual techniques;Taping;Vasopneumatic Device   PT Next Visit Plan Knee strength and stability exs, ROm and soft tissue work. See if heat estim combo was more helpful than Game ready.    Consulted and Agree with Plan of Care Patient      Patient will benefit from skilled therapeutic intervention in order to improve the following deficits and impairments:  Abnormal gait, Decreased range of motion, Decreased endurance, Decreased activity tolerance, Pain, Decreased strength, Increased edema, Decreased balance, Hypomobility, Impaired flexibility  Visit Diagnosis: Acute pain of right knee  Localized edema  Other abnormalities of gait and mobility  Muscle weakness (generalized)  Stiffness of right knee, not elsewhere classified     Problem List Patient Active Problem List   Diagnosis Date Noted  . Unilateral primary osteoarthritis, right knee 06/24/2016  . S/P total  knee replacement using cement, right 06/24/2016  . HYPERLIPIDEMIA 01/18/2007  . HYPERTENSION 01/18/2007    Glenda Kunst, PTA 08/01/2016, 8:55 AM  [facility] [address] [place], [place], [address] Phone: [phone]   Fax:  [phone]  Name: Philip Winters MRN: [id number] Date of Birth: June 24, 1960

## 2016-08-04 ENCOUNTER — Ambulatory Visit: Payer: 59

## 2016-08-04 DIAGNOSIS — R2689 Other abnormalities of gait and mobility: Secondary | ICD-10-CM

## 2016-08-04 DIAGNOSIS — M25561 Pain in right knee: Secondary | ICD-10-CM | POA: Diagnosis not present

## 2016-08-04 DIAGNOSIS — M25661 Stiffness of right knee, not elsewhere classified: Secondary | ICD-10-CM

## 2016-08-04 DIAGNOSIS — M6281 Muscle weakness (generalized): Secondary | ICD-10-CM

## 2016-08-04 NOTE — Therapy (Signed)
[facility] [address] [place], [place], [address] Phone: [phone]   Fax:  [phone]  Physical Therapy Treatment  Patient Details  Name: Philip Winters MRN: [id number] Date of Birth: 10-25-1960 Referring Provider: Joni Fears, MD  Encounter Date: 08/04/2016      Philip Winters End of Session - 08/04/16 0840    Visit Number 11   Date for Philip Winters Re-Evaluation 09/03/16   Authorization Time Period 23 visit limit   Authorization - Visit Number 11   Authorization - Number of Visits 23   Philip Winters Start Time 0800   Philip Winters Stop Time 0856   Philip Winters Time Calculation (min) 56 min   Activity Tolerance Patient tolerated treatment well   Behavior During Therapy [facility] for tasks assessed/performed      Past Medical History:  Diagnosis Date  . Allergy   . Arthritis   . GERD (gastroesophageal reflux disease)    occ  . Heart murmur    baby  . Hypertension   . Seasonal allergies     Past Surgical History:  Procedure Laterality Date  . CARPAL TUNNEL WITH CUBITAL TUNNEL Left 11/10/2013   Procedure: LEFT CARPAL TUNNEL RELEASE;  Surgeon: Cammie Sickle, MD;  Location: [place];  Service: Orthopedics;  Laterality: Left;  . COLONOSCOPY    . KNEE ARTHROSCOPY  [id number]   left and right  . NASAL SINUS SURGERY  05/2015  . TOTAL KNEE ARTHROPLASTY Right 06/24/2016   Procedure: TOTAL KNEE ARTHROPLASTY;  Surgeon: Garald Balding, MD;  Location: [place];  Service: Orthopedics;  Laterality: Right;  . TRIGGER FINGER RELEASE Left 11/10/2013   Procedure: LEFT INDEX A-1 PULLEY RELEASE;  Surgeon: Cammie Sickle, MD;  Location: [place];  Service: Orthopedics;  Laterality: Left;  . ULNAR NERVE TRANSPOSITION Left 11/10/2013   Procedure: ULNAR NERVE TRANSPOSITION;  Surgeon: Cammie Sickle, MD;  Location: [place];  Service: Orthopedics;  Laterality: Left;    There were no vitals filed for this visit.       Subjective Assessment - 08/04/16 0804    Subjective I've been doing a lot of walking.  My calf was sore from all the walking.   Currently in Pain? Yes   Pain Score 3    Pain Location Knee   Pain Orientation Right   Pain Descriptors / Indicators Sore   Pain Type Surgical pain   Pain Onset More than a month ago   Pain Frequency Constant   Aggravating Factors  walking a lot, pain is constant   Pain Relieving Factors ice, rest, heat            [facility] Philip Winters Assessment - 08/04/16 0001      PROM   Overall PROM Comments -5 degrees extension                     [facility] Adult Philip Winters Treatment/Exercise - 08/04/16 0001      Knee/Hip Exercises: Stretches   Gastroc Stretch Right;3 reps;20 seconds  On slant board     Knee/Hip Exercises: Aerobic   Stationary Bike L3 x 8 min  Philip Winters present to discuss progress   Elliptical --     Knee/Hip Exercises: Machines for Strengthening   Cybex Leg Press 100# bil knee 3x10, 55# on Rt 3x10     Knee/Hip Exercises: Standing   Forward Step Up Right;2 sets;10 reps;Step Height: 8"   Rebounder weight shifting 1  min 3 ways   Walking with Sports Cord 35# 4 ways      Electrical Stimulation   Electrical Stimulation Location Rt knee   Electrical Stimulation Action IFC   Electrical Stimulation Parameters 15 minutes   Electrical Stimulation Goals Pain     Manual Therapy   Manual Therapy Soft tissue mobilization;Passive ROM;Joint mobilization   Passive ROM right knee for extension                  Philip Winters Short Term Goals - 08/04/16 0806      Philip Winters SHORT TERM GOAL #5   Title improve endurance to walk for 20-25 minutes without need to rest   Status Achieved           Philip Winters Long Term Goals - 08/04/16 0809      Philip Winters LONG TERM GOAL #1   Title be independent in advanced HEP   Time 8   Period Weeks   Status On-going     Philip Winters LONG TERM GOAL #3   Title wean from assistive device on level surface and demonstrate symmetry   Time 8   Period Weeks    Status On-going     Philip Winters LONG TERM GOAL #6   Title report < or 3/10 Rt knee pain with standing and walking   Time 8   Period Weeks   Status Achieved               Plan - 08/04/16 0809    Clinical Impression Statement Philip Winters is making steady progress s/p Rt TKA.  Philip Winters has weaned from device and demonstrates mild antalgia due to lack of full extension on the Rt.  Philip Winters is walking longer distances and reports that he walked 8 miles yesterday.  Philip Winters is responding well to use of e-stim and heat to relax hamstring post session.  Philip Winters will continue to benefit from skilled Philip Winters for Rt knee strength, endurance, flexiblity and gait.     Rehab Potential Good   Philip Winters Frequency 3x / week   Philip Winters Duration 8 weeks   Philip Winters Treatment/Interventions ADLs/Self Care Home Management;Cryotherapy;Electrical Stimulation;Functional mobility training;Stair training;Gait training;Ultrasound;Moist Heat;Therapeutic activities;Therapeutic exercise;Neuromuscular re-education;Balance training;Patient/family education;Passive range of motion;Scar mobilization;Manual techniques;Taping;Vasopneumatic Device   Philip Winters Next Visit Plan Knee strength and stability exs, ROm and soft tissue work.  Continue e-stim and heat if helpful.     Consulted and Agree with Plan of Care Patient      Patient will benefit from skilled therapeutic intervention in order to improve the following deficits and impairments:  Abnormal gait, Decreased range of motion, Decreased endurance, Decreased activity tolerance, Pain, Decreased strength, Increased edema, Decreased balance, Hypomobility, Impaired flexibility  Visit Diagnosis: Acute pain of right knee  Other abnormalities of gait and mobility  Muscle weakness (generalized)  Stiffness of right knee, not elsewhere classified     Problem List Patient Active Problem List   Diagnosis Date Noted  . Unilateral primary osteoarthritis, right knee 06/24/2016  . S/P total knee replacement using cement, right 06/24/2016   . HYPERLIPIDEMIA 01/18/2007  . HYPERTENSION 01/18/2007     Philip Winters, Philip Winters 08/04/16 8:42 AM  [facility] [address], [place], [place], [address] Phone: [phone]   Fax:  [phone]  Name: Philip Winters MRN: [id number] Date of Birth: [birth year]

## 2016-08-06 ENCOUNTER — Encounter: Payer: Self-pay | Admitting: Physical Therapy

## 2016-08-06 ENCOUNTER — Ambulatory Visit: Payer: 59 | Admitting: Physical Therapy

## 2016-08-06 DIAGNOSIS — M25561 Pain in right knee: Secondary | ICD-10-CM

## 2016-08-06 DIAGNOSIS — M25661 Stiffness of right knee, not elsewhere classified: Secondary | ICD-10-CM

## 2016-08-06 DIAGNOSIS — M6281 Muscle weakness (generalized): Secondary | ICD-10-CM

## 2016-08-06 DIAGNOSIS — R2689 Other abnormalities of gait and mobility: Secondary | ICD-10-CM

## 2016-08-06 DIAGNOSIS — R600 Localized edema: Secondary | ICD-10-CM

## 2016-08-06 NOTE — Therapy (Signed)
[facility] [address], [place], [place], [address] Phone: [phone]   Fax:  [phone]  Physical Therapy Treatment  Patient Details  Name: Philip Winters MRN: [id number] Date of Birth: Aug 30, 1960 Referring Provider: Joni Fears, MD  Encounter Date: 08/06/2016      PT End of Session - 08/06/16 0810    Visit Number 12   Date for PT Re-Evaluation 09/03/16   Authorization Time Period 23 visit limit   Authorization - Visit Number 12   Authorization - Number of Visits 23   PT Start Time 0800   PT Stop Time 0902   PT Time Calculation (min) 62 min   Activity Tolerance Patient tolerated treatment well   Behavior During Therapy [facility] for tasks assessed/performed      Past Medical History:  Diagnosis Date  . Allergy   . Arthritis   . GERD (gastroesophageal reflux disease)    occ  . Heart murmur    baby  . Hypertension   . Seasonal allergies     Past Surgical History:  Procedure Laterality Date  . CARPAL TUNNEL WITH CUBITAL TUNNEL Left 11/10/2013   Procedure: LEFT CARPAL TUNNEL RELEASE;  Surgeon: Cammie Sickle, MD;  Location: [place];  Service: Orthopedics;  Laterality: Left;  . COLONOSCOPY    . KNEE ARTHROSCOPY  8563,1497   left and right  . NASAL SINUS SURGERY  05/2015  . TOTAL KNEE ARTHROPLASTY Right 06/24/2016   Procedure: TOTAL KNEE ARTHROPLASTY;  Surgeon: Garald Balding, MD;  Location: [place];  Service: Orthopedics;  Laterality: Right;  . TRIGGER FINGER RELEASE Left 11/10/2013   Procedure: LEFT INDEX A-1 PULLEY RELEASE;  Surgeon: Cammie Sickle, MD;  Location: [place];  Service: Orthopedics;  Laterality: Left;  . ULNAR NERVE TRANSPOSITION Left 11/10/2013   Procedure: ULNAR NERVE TRANSPOSITION;  Surgeon: Cammie Sickle, MD;  Location: [place];  Service: Orthopedics;  Laterality: Left;    There were no vitals filed for this visit.       Subjective Assessment - 08/06/16 0804    Subjective Pt reports sharp chest pain apon entering for treatment. BP taken before exercise 152/75. Pt rested for 5 minutes and BP remeasured. 140/75. Pt wanting to continue with treatment. Pt advised to go slow and report feeling any worse. Knee doing alright. Always a little painful   Pertinent History Rt TKA 06/24/16   Limitations Sitting;Standing;Walking   How long can you sit comfortably? 30 minutes   How long can you stand comfortably? 15 minutes    How long can you walk comfortably? 15 minutes   Patient Stated Goals reduce pain, walk without device, improve Rt knee A/ROM, reduce edema   Currently in Pain? Yes   Pain Score 3    Pain Location Knee   Pain Orientation Right   Pain Descriptors / Indicators Sore   Pain Type Surgical pain   Pain Onset More than a month ago   Pain Frequency Constant   Aggravating Factors  walking a lot, pain is constant   Pain Relieving Factors ice, rest, heat   Multiple Pain Sites No                         [facility] Adult PT Treatment/Exercise - 08/06/16 0001      Knee/Hip Exercises: Stretches   Gastroc Stretch Right;3 reps;20 seconds  On slant board  Knee/Hip Exercises: Aerobic   Stationary Bike L3 x 5 min  PT present to discuss progress     Knee/Hip Exercises: Machines for Strengthening   Cybex Leg Press 100# bil knee 3x10, 55# on Rt 3x10  Seat 6     Knee/Hip Exercises: Standing   Forward Step Up Right;2 sets;10 reps;Step Height: 8"   Rebounder weight shifting 1 min 3 ways   Walking with Sports Cord 35# 4 ways      Moist Heat Therapy   Number Minutes Moist Heat 15 Minutes   Moist Heat Location Knee     Electrical Stimulation   Electrical Stimulation Location Rt knee   Electrical Stimulation Action IFC   Electrical Stimulation Parameters 15 minutes   Electrical Stimulation Goals Pain     Manual Therapy   Manual Therapy Soft tissue mobilization;Joint mobilization   Soft  tissue mobilization scar massage; soft tissue work to [employer] forces training and education officer and knee and quads                  PT Short Term Goals - 08/04/16 0806      PT SHORT TERM GOAL #5   Title improve endurance to walk for 20-25 minutes without need to rest   Status Achieved           PT Long Term Goals - 08/04/16 0809      PT LONG TERM GOAL #1   Title be independent in advanced HEP   Time 8   Period Weeks   Status On-going     PT LONG TERM GOAL #3   Title wean from assistive device on level surface and demonstrate symmetry   Time 8   Period Weeks   Status On-going     PT LONG TERM GOAL #6   Title report < or 3/10 Rt knee pain with standing and walking   Time 8   Period Weeks   Status Achieved               Plan - 08/06/16 0826    Clinical Impression Statement Pt reportsing chest pain at arival for therapy. BP checked. Pt wanting to continue treatment and advised to go slowly and report any increased pain. Pt able to tolerate all treatment well. Pt continues to progress with strength and knee stability.   Rehab Potential Good   Clinical Impairments Affecting Rehab Potential None   PT Frequency 3x / week   PT Duration 8 weeks   PT Treatment/Interventions ADLs/Self Care Home Management;Cryotherapy;Electrical Stimulation;Functional mobility training;Stair training;Gait training;Ultrasound;Moist Heat;Therapeutic activities;Therapeutic exercise;Neuromuscular re-education;Balance training;Patient/family education;Passive range of motion;Scar mobilization;Manual techniques;Taping;Vasopneumatic Device   PT Next Visit Plan Knee strength and stability exs, ROm and soft tissue work.  Continue e-stim and heat if helpful.     Consulted and Agree with Plan of Care Patient      Patient will benefit from skilled therapeutic intervention in order to improve the following deficits and impairments:  Abnormal gait, Decreased range of motion, Decreased endurance, Decreased activity  tolerance, Pain, Decreased strength, Increased edema, Decreased balance, Hypomobility, Impaired flexibility  Visit Diagnosis: Acute pain of right knee  Other abnormalities of gait and mobility  Muscle weakness (generalized)  Stiffness of right knee, not elsewhere classified  Localized edema     Problem List Patient Active Problem List   Diagnosis Date Noted  . Unilateral primary osteoarthritis, right knee 06/24/2016  . S/P total knee replacement using cement, right 06/24/2016  . HYPERLIPIDEMIA 01/18/2007  . HYPERTENSION 01/18/2007  Mikle Bosworth PTA 08/06/2016, 9:12 AM  [facility] [address], [place], [place], [address] Phone: [phone]   Fax:  [phone]  Name: TSUGIO ELISON MRN: [id number] Date of Birth: 1960-10-13

## 2016-08-08 ENCOUNTER — Ambulatory Visit: Payer: 59 | Admitting: Physical Therapy

## 2016-08-08 ENCOUNTER — Encounter: Payer: Self-pay | Admitting: Physical Therapy

## 2016-08-08 DIAGNOSIS — M25661 Stiffness of right knee, not elsewhere classified: Secondary | ICD-10-CM

## 2016-08-08 DIAGNOSIS — M25561 Pain in right knee: Secondary | ICD-10-CM | POA: Diagnosis not present

## 2016-08-08 DIAGNOSIS — R600 Localized edema: Secondary | ICD-10-CM

## 2016-08-08 DIAGNOSIS — R2689 Other abnormalities of gait and mobility: Secondary | ICD-10-CM

## 2016-08-08 DIAGNOSIS — M6281 Muscle weakness (generalized): Secondary | ICD-10-CM

## 2016-08-08 NOTE — Therapy (Signed)
[facility] [address], [place], [place], [address] Phone: [phone]   Fax:  [phone]  Physical Therapy Treatment  Patient Details  Name: Philip Winters MRN: [id number] Date of Birth: [birth year] Referring Provider: Joni Fears, MD  Encounter Date: 08/08/2016      PT End of Session - 08/08/16 0801    Visit Number 13   Date for PT Re-Evaluation 09/03/16   Authorization Time Period 23 visit limit   Authorization - Visit Number 78   Authorization - Number of Visits 23   PT Start Time 0758   PT Stop Time 0842   PT Time Calculation (min) 44 min   Activity Tolerance Patient tolerated treatment well   Behavior During Therapy [facility] for tasks assessed/performed      Past Medical History:  Diagnosis Date  . Allergy   . Arthritis   . GERD (gastroesophageal reflux disease)    occ  . Heart murmur    baby  . Hypertension   . Seasonal allergies     Past Surgical History:  Procedure Laterality Date  . CARPAL TUNNEL WITH CUBITAL TUNNEL Left 11/10/2013   Procedure: LEFT CARPAL TUNNEL RELEASE;  Surgeon: Cammie Sickle, MD;  Location: [place];  Service: Orthopedics;  Laterality: Left;  . COLONOSCOPY    . KNEE ARTHROSCOPY  8638,1771   left and right  . NASAL SINUS SURGERY  05/2015  . TOTAL KNEE ARTHROPLASTY Right 06/24/2016   Procedure: TOTAL KNEE ARTHROPLASTY;  Surgeon: Garald Balding, MD;  Location: [place];  Service: Orthopedics;  Laterality: Right;  . TRIGGER FINGER RELEASE Left 11/10/2013   Procedure: LEFT INDEX A-1 PULLEY RELEASE;  Surgeon: Cammie Sickle, MD;  Location: [place];  Service: Orthopedics;  Laterality: Left;  . ULNAR NERVE TRANSPOSITION Left 11/10/2013   Procedure: ULNAR NERVE TRANSPOSITION;  Surgeon: Cammie Sickle, MD;  Location: [place];  Service: Orthopedics;  Laterality: Left;    There were no vitals filed for this visit.       Subjective Assessment - 08/08/16 0759    Subjective Knee was keeping me up half the night, I coudn't get comfortable.    Pertinent History Rt TKA 06/24/16   Limitations Sitting;Standing;Walking   How long can you sit comfortably? 30 minutes   How long can you stand comfortably? 15 minutes    How long can you walk comfortably? 15 minutes   Patient Stated Goals reduce pain, walk without device, improve Rt knee A/ROM, reduce edema   Currently in Pain? No/denies   Pain Score 0-No pain                         [facility] Adult PT Treatment/Exercise - 08/08/16 0001      Knee/Hip Exercises: Stretches   Gastroc Stretch Right;3 reps;20 seconds  On slant board     Knee/Hip Exercises: Aerobic   Stationary Bike L3 x 8 min  PT present to discuss progress   Elliptical L2 x 6 min  PTA present     Knee/Hip Exercises: Machines for Strengthening   Cybex Knee Extension #25 2x10   lower slowly for eccentric contraction   Cybex Leg Press 100# bil knee 3x10, 55# on Rt 3x10  Seat 6     Knee/Hip Exercises: Standing   Lateral Step Up Right;1 set;20 reps;Hand Hold: 1;Step Height: 4"   Forward Step Up --  Practiced navigating  stairs   Rebounder Single leg stance 2 x 30 seconds   Walking with Sports Cord --     Manual Therapy   Manual Therapy Soft tissue mobilization;Joint mobilization   Manual therapy comments Pt prone   Soft tissue mobilization hamstring and popltius   Passive ROM Knee in flexion                  PT Short Term Goals - 08/04/16 0806      PT SHORT TERM GOAL #5   Title improve endurance to walk for 20-25 minutes without need to rest   Status Achieved           PT Long Term Goals - 08/04/16 0809      PT LONG TERM GOAL #1   Title be independent in advanced HEP   Time 8   Period Weeks   Status On-going     PT LONG TERM GOAL #3   Title wean from assistive device on level surface and demonstrate symmetry   Time 8   Period Weeks   Status On-going      PT LONG TERM GOAL #6   Title report < or 3/10 Rt knee pain with standing and walking   Time 8   Period Weeks   Status Achieved               Plan - 08/08/16 0388    Clinical Impression Statement Pt continues to progress with strength and knee stability. Continues to have some difficulty with straightening knee due to tightness and pain in back of knee. Pt continues to have difficulty descending stairs due to weakness. Pt will continue to benefit from skilled therapy for LE strength and motor control.    Rehab Potential Good   Clinical Impairments Affecting Rehab Potential None   PT Frequency 3x / week   PT Duration 8 weeks   PT Treatment/Interventions ADLs/Self Care Home Management;Cryotherapy;Electrical Stimulation;Functional mobility training;Stair training;Gait training;Ultrasound;Moist Heat;Therapeutic activities;Therapeutic exercise;Neuromuscular re-education;Balance training;Patient/family education;Passive range of motion;Scar mobilization;Manual techniques;Taping;Vasopneumatic Device   PT Next Visit Plan Stairs, eccentric quad control   Consulted and Agree with Plan of Care Patient      Patient will benefit from skilled therapeutic intervention in order to improve the following deficits and impairments:  Abnormal gait, Decreased range of motion, Decreased endurance, Decreased activity tolerance, Pain, Decreased strength, Increased edema, Decreased balance, Hypomobility, Impaired flexibility  Visit Diagnosis: Acute pain of right knee  Other abnormalities of gait and mobility  Muscle weakness (generalized)  Stiffness of right knee, not elsewhere classified  Localized edema     Problem List Patient Active Problem List   Diagnosis Date Noted  . Unilateral primary osteoarthritis, right knee 06/24/2016  . S/P total knee replacement using cement, right 06/24/2016  . HYPERLIPIDEMIA 01/18/2007  . HYPERTENSION 01/18/2007    Mikle Bosworth PTA 08/08/2016,  8:56 AM  [facility] [address], [place], [place], [address] Phone: [phone]   Fax:  [phone]  Name: Philip Winters MRN: [id number] Date of Birth: 1960-12-27

## 2016-08-11 ENCOUNTER — Encounter: Payer: Self-pay | Admitting: Physical Therapy

## 2016-08-11 ENCOUNTER — Ambulatory Visit: Payer: 59 | Admitting: Physical Therapy

## 2016-08-11 DIAGNOSIS — M25561 Pain in right knee: Secondary | ICD-10-CM

## 2016-08-11 DIAGNOSIS — R2689 Other abnormalities of gait and mobility: Secondary | ICD-10-CM

## 2016-08-11 DIAGNOSIS — R600 Localized edema: Secondary | ICD-10-CM

## 2016-08-11 DIAGNOSIS — M25661 Stiffness of right knee, not elsewhere classified: Secondary | ICD-10-CM

## 2016-08-11 DIAGNOSIS — M6281 Muscle weakness (generalized): Secondary | ICD-10-CM

## 2016-08-11 NOTE — Therapy (Signed)
[facility] [address] [place], [place], [address] Phone: [phone]   Fax:  [phone]  Physical Therapy Treatment  Patient Details  Name: Philip Winters MRN: [id number] Date of Birth: [birth year] Referring Provider: Joni Fears, MD  Encounter Date: 08/11/2016      PT End of Session - 08/11/16 0803    Visit Number 14   Date for PT Re-Evaluation 09/03/16   Authorization Time Period 23 visit limit   Authorization - Visit Number 47   Authorization - Number of Visits 23   PT Start Time 0758   PT Stop Time 0851   PT Time Calculation (min) 53 min   Activity Tolerance Patient tolerated treatment well   Behavior During Therapy [facility] for tasks assessed/performed      Past Medical History:  Diagnosis Date  . Allergy   . Arthritis   . GERD (gastroesophageal reflux disease)    occ  . Heart murmur    baby  . Hypertension   . Seasonal allergies     Past Surgical History:  Procedure Laterality Date  . CARPAL TUNNEL WITH CUBITAL TUNNEL Left 11/10/2013   Procedure: LEFT CARPAL TUNNEL RELEASE;  Surgeon: Cammie Sickle, MD;  Location: [place];  Service: Orthopedics;  Laterality: Left;  . COLONOSCOPY    . KNEE ARTHROSCOPY  8978,4784   left and right  . NASAL SINUS SURGERY  05/2015  . TOTAL KNEE ARTHROPLASTY Right 06/24/2016   Procedure: TOTAL KNEE ARTHROPLASTY;  Surgeon: Garald Balding, MD;  Location: [place];  Service: Orthopedics;  Laterality: Right;  . TRIGGER FINGER RELEASE Left 11/10/2013   Procedure: LEFT INDEX A-1 PULLEY RELEASE;  Surgeon: Cammie Sickle, MD;  Location: [place];  Service: Orthopedics;  Laterality: Left;  . ULNAR NERVE TRANSPOSITION Left 11/10/2013   Procedure: ULNAR NERVE TRANSPOSITION;  Surgeon: Cammie Sickle, MD;  Location: [place];  Service: Orthopedics;  Laterality: Left;    There were no vitals filed for this visit.       Subjective Assessment - 08/11/16 0801    Subjective Having a lot of muscle soreness this weekend and today. No pain just tightness.   Pertinent History Rt TKA 06/24/16   Limitations Sitting;Standing;Walking   How long can you sit comfortably? 30 minutes   How long can you stand comfortably? 15 minutes    How long can you walk comfortably? 15 minutes   Patient Stated Goals reduce pain, walk without device, improve Rt knee A/ROM, reduce edema   Currently in Pain? No/denies   Pain Score 0-No pain                         [facility] Adult PT Treatment/Exercise - 08/11/16 0001      Knee/Hip Exercises: Stretches   Active Hamstring Stretch Both;2 reps;10 seconds   Quad Stretch Both;2 reps;10 seconds   Gastroc Stretch Right;3 reps;20 seconds  On slant board     Knee/Hip Exercises: Aerobic   Stationary Bike L3 x 8 min  PT present to discuss progress     Knee/Hip Exercises: Machines for Strengthening   Cybex Knee Extension #25 2x10   lower slowly for eccentric contraction     Knee/Hip Exercises: Standing   Lateral Step Up Right;1 set;20 reps;Hand Hold: 1;Step Height: 4"   Walking with Sports Cord 35# 4 ways      Moist Heat Therapy  Number Minutes Moist Heat 15 Minutes   Moist Heat Location Knee     Electrical Stimulation   Electrical Stimulation Location Rt hamstring   Electrical Stimulation Action Pre mod   Electrical Stimulation Parameters 15 minutes   Electrical Stimulation Goals Pain                  PT Short Term Goals - 08/11/16 0803      PT SHORT TERM GOAL #3   Title demonstrate Rt knee A/ROM extension to lacking < or = to 10 degrees to normalize gait   Baseline -5 AROM   Time 4   Period Weeks   Status Achieved     PT SHORT TERM GOAL #4   Title demonstrate Rt quad strength > or = to 4/5 without lag to improve Rt knee stability   Time 4   Period Weeks   Status Achieved           PT Long Term Goals - 08/11/16 8811      PT LONG TERM  GOAL #1   Title be independent in advanced HEP   Time 8   Period Weeks   Status On-going     PT LONG TERM GOAL #2   Title reduce FOTO to < or = to 49% limitation   Time 8   Status On-going     PT LONG TERM GOAL #3   Title wean from assistive device on level surface and demonstrate symmetry   Status Achieved               Plan - 08/11/16 0809    Clinical Impression Statement Pt having increased muscle soreness today from strengthening exercises. Able to tolerate all exercise well with no increase pain. Pt conitnues to be limited with eccentric quad control for descending stairs and walking down drive way. Pt is planning to get a gym membership to continue strengthening. Pt has met al short term goals and is progressing towards long term goals. Pt is limited with knee flexion due to pain and tightness in posteriror knee. Tryed Estim with heat to posteriror knee to lessen tightness and pain. Pt will continue to benefit from skilled therapy for LE strength and stability.    Rehab Potential Good   Clinical Impairments Affecting Rehab Potential None   PT Frequency 3x / week   PT Duration 8 weeks   PT Treatment/Interventions ADLs/Self Care Home Management;Cryotherapy;Electrical Stimulation;Functional mobility training;Stair training;Gait training;Ultrasound;Moist Heat;Therapeutic activities;Therapeutic exercise;Neuromuscular re-education;Balance training;Patient/family education;Passive range of motion;Scar mobilization;Manual techniques;Taping;Vasopneumatic Device   PT Next Visit Plan Stairs, eccentric quad control   Consulted and Agree with Plan of Care Patient      Patient will benefit from skilled therapeutic intervention in order to improve the following deficits and impairments:  Abnormal gait, Decreased range of motion, Decreased endurance, Decreased activity tolerance, Pain, Decreased strength, Increased edema, Decreased balance, Hypomobility, Impaired flexibility  Visit  Diagnosis: Acute pain of right knee  Other abnormalities of gait and mobility  Muscle weakness (generalized)  Stiffness of right knee, not elsewhere classified  Localized edema     Problem List Patient Active Problem List   Diagnosis Date Noted  . Unilateral primary osteoarthritis, right knee 06/24/2016  . S/P total knee replacement using cement, right 06/24/2016  . HYPERLIPIDEMIA 01/18/2007  . HYPERTENSION 01/18/2007    Philip Winters PTA 08/11/2016, 8:43 AM  [facility] [address] [place], [place], [address] Phone: [phone]   Fax:  [phone]  Name: Philip Winters MRN: [id number] Date of Birth: 1960/08/28

## 2016-08-13 ENCOUNTER — Ambulatory Visit: Payer: 59

## 2016-08-13 DIAGNOSIS — M25661 Stiffness of right knee, not elsewhere classified: Secondary | ICD-10-CM

## 2016-08-13 DIAGNOSIS — R600 Localized edema: Secondary | ICD-10-CM

## 2016-08-13 DIAGNOSIS — R2689 Other abnormalities of gait and mobility: Secondary | ICD-10-CM

## 2016-08-13 DIAGNOSIS — M6281 Muscle weakness (generalized): Secondary | ICD-10-CM

## 2016-08-13 DIAGNOSIS — M25561 Pain in right knee: Secondary | ICD-10-CM

## 2016-08-13 NOTE — Therapy (Signed)
[facility] [address], [place], [place], [address] Phone: [phone]   Fax:  [phone]  Physical Therapy Treatment  Patient Details  Name: Philip Winters MRN: [id number] Date of Birth: 25-Feb-1961 Referring Provider: Joni Fears, MD  Encounter Date: 08/13/2016      PT End of Session - 08/13/16 0843    Visit Number 15   Date for PT Re-Evaluation 09/03/16   Authorization Time Period 23 visit limit   Authorization - Visit Number 15   Authorization - Number of Visits 23   PT Start Time 0803   PT Stop Time 0900   PT Time Calculation (min) 57 min   Activity Tolerance Patient tolerated treatment well   Behavior During Therapy [facility] for tasks assessed/performed      Past Medical History:  Diagnosis Date  . Allergy   . Arthritis   . GERD (gastroesophageal reflux disease)    occ  . Heart murmur    baby  . Hypertension   . Seasonal allergies     Past Surgical History:  Procedure Laterality Date  . CARPAL TUNNEL WITH CUBITAL TUNNEL Left 11/10/2013   Procedure: LEFT CARPAL TUNNEL RELEASE;  Surgeon: Cammie Sickle, MD;  Location: [place];  Service: Orthopedics;  Laterality: Left;  . COLONOSCOPY    . KNEE ARTHROSCOPY  1962,[id number]   left and right  . NASAL SINUS SURGERY  05/2015  . TOTAL KNEE ARTHROPLASTY Right 06/24/2016   Procedure: TOTAL KNEE ARTHROPLASTY;  Surgeon: Garald Balding, MD;  Location: [place];  Service: Orthopedics;  Laterality: Right;  . TRIGGER FINGER RELEASE Left 11/10/2013   Procedure: LEFT INDEX A-1 PULLEY RELEASE;  Surgeon: Cammie Sickle, MD;  Location: [place];  Service: Orthopedics;  Laterality: Left;  . ULNAR NERVE TRANSPOSITION Left 11/10/2013   Procedure: ULNAR NERVE TRANSPOSITION;  Surgeon: Cammie Sickle, MD;  Location: [place];  Service: Orthopedics;  Laterality: Left;    There were no vitals filed for this visit.       Subjective Assessment - 08/13/16 0808    Subjective Pt reports that he had a really bad day yesterday.  Pain was 7/10.  Iced and took pain medication.     Pertinent History Rt TKA 06/24/16   Patient Stated Goals reduce pain, walk without device, improve Rt knee A/ROM, reduce edema   Pain Score 5    Pain Orientation Right   Pain Descriptors / Indicators Sore   Pain Type Surgical pain   Pain Onset More than a month ago   Pain Frequency Constant   Aggravating Factors  walking a lot, sometimes no pattern   Pain Relieving Factors ice, rest, pain medication                         [facility] Adult PT Treatment/Exercise - 08/13/16 0001      Knee/Hip Exercises: Stretches   Active Hamstring Stretch Both;2 reps;10 seconds   Gastroc Stretch Right;3 reps;20 seconds  On slant board     Knee/Hip Exercises: Aerobic   Nustep Level 3 x 8 minutes  PT present to discuss pain that pt had yesterday     Knee/Hip Exercises: Machines for Strengthening   Cybex Leg Press 100# bil knee 3x10, 55# on Rt 3x10  Seat 6     Knee/Hip Exercises: Standing   Rebounder --   Walking with Sports Cord 30# 4 ways  x10 each     Vasopneumatic   Number Minutes Vasopneumatic  15 minutes   Vasopnuematic Location  Knee   Vasopneumatic Pressure High   Vasopneumatic Temperature  3 snowflakes     Manual Therapy   Manual Therapy Soft tissue mobilization;Joint mobilization   Soft tissue mobilization hamstring and popltius, distal quad and scar                  PT Short Term Goals - 08/11/16 0803      PT SHORT TERM GOAL #3   Title demonstrate Rt knee A/ROM extension to lacking < or = to 10 degrees to normalize gait   Baseline -5 AROM   Time 4   Period Weeks   Status Achieved     PT SHORT TERM GOAL #4   Title demonstrate Rt quad strength > or = to 4/5 without lag to improve Rt knee stability   Time 4   Period Weeks   Status Achieved           PT Long Term Goals - 08/11/16 8003       PT LONG TERM GOAL #1   Title be independent in advanced HEP   Time 8   Period Weeks   Status On-going     PT LONG TERM GOAL #2   Title reduce FOTO to < or = to 49% limitation   Time 8   Status On-going     PT LONG TERM GOAL #3   Title wean from assistive device on level surface and demonstrate symmetry   Status Achieved               Plan - 08/13/16 0814    Clinical Impression Statement Pt had a flare-up of pain and edema yesterday.  Pt with continued reduced eccentric quad control with descending hill or steps.  Pt has met all short term goals and is working toward full knee extension and eccentric strength progression.  Pt will continue to benefit from skilled PT for edema management, strength progression and gait.     Rehab Potential Good   PT Frequency 3x / week   PT Duration 8 weeks   PT Treatment/Interventions ADLs/Self Care Home Management;Cryotherapy;Electrical Stimulation;Functional mobility training;Stair training;Gait training;Ultrasound;Moist Heat;Therapeutic activities;Therapeutic exercise;Neuromuscular re-education;Balance training;Patient/family education;Passive range of motion;Scar mobilization;Manual techniques;Taping;Vasopneumatic Device   PT Next Visit Plan Stairs, eccentric quad control   Consulted and Agree with Plan of Care Patient      Patient will benefit from skilled therapeutic intervention in order to improve the following deficits and impairments:  Abnormal gait, Decreased range of motion, Decreased endurance, Decreased activity tolerance, Pain, Decreased strength, Increased edema, Decreased balance, Hypomobility, Impaired flexibility  Visit Diagnosis: Acute pain of right knee  Other abnormalities of gait and mobility  Muscle weakness (generalized)  Stiffness of right knee, not elsewhere classified  Localized edema     Problem List Patient Active Problem List   Diagnosis Date Noted  . Unilateral primary osteoarthritis, right knee  06/24/2016  . S/P total knee replacement using cement, right 06/24/2016  . HYPERLIPIDEMIA 01/18/2007  . HYPERTENSION 01/18/2007    Philip Winters, PT 08/13/16 8:46 AM  [facility] [address], [place], [place], [address] Phone: [phone]   Fax:  [phone]  Name: Philip Winters MRN: [id number] Date of Birth: March 17, 1961

## 2016-08-18 ENCOUNTER — Ambulatory Visit: Payer: 59 | Attending: Orthopaedic Surgery | Admitting: Physical Therapy

## 2016-08-18 ENCOUNTER — Encounter: Payer: Self-pay | Admitting: Physical Therapy

## 2016-08-18 DIAGNOSIS — R2689 Other abnormalities of gait and mobility: Secondary | ICD-10-CM | POA: Insufficient documentation

## 2016-08-18 DIAGNOSIS — B356 Tinea cruris: Secondary | ICD-10-CM | POA: Diagnosis not present

## 2016-08-18 DIAGNOSIS — M6281 Muscle weakness (generalized): Secondary | ICD-10-CM | POA: Insufficient documentation

## 2016-08-18 DIAGNOSIS — M25661 Stiffness of right knee, not elsewhere classified: Secondary | ICD-10-CM | POA: Diagnosis present

## 2016-08-18 DIAGNOSIS — M25561 Pain in right knee: Secondary | ICD-10-CM | POA: Diagnosis not present

## 2016-08-18 DIAGNOSIS — R600 Localized edema: Secondary | ICD-10-CM | POA: Diagnosis present

## 2016-08-18 NOTE — Therapy (Signed)
[facility] [address] [place], [place], [address] Phone: [phone]   Fax:  [phone]  Physical Therapy Treatment  Patient Details  Name: Philip Winters MRN: [id number] Date of Birth: [birth year] Referring Provider: Joni Fears, MD  Encounter Date: 08/18/2016      PT End of Session - 08/18/16 0803    Visit Number 16   Date for PT Re-Evaluation 09/03/16   Authorization Time Period 23 visit limit   Authorization - Visit Number 88   Authorization - Number of Visits 23   PT Start Time 0759   PT Stop Time 0842   PT Time Calculation (min) 43 min   Activity Tolerance Patient tolerated treatment well   Behavior During Therapy [facility] for tasks assessed/performed      Past Medical History:  Diagnosis Date  . Allergy   . Arthritis   . GERD (gastroesophageal reflux disease)    occ  . Heart murmur    baby  . Hypertension   . Seasonal allergies     Past Surgical History:  Procedure Laterality Date  . CARPAL TUNNEL WITH CUBITAL TUNNEL Left 11/10/2013   Procedure: LEFT CARPAL TUNNEL RELEASE;  Surgeon: Cammie Sickle, MD;  Location: [place];  Service: Orthopedics;  Laterality: Left;  . COLONOSCOPY    . KNEE ARTHROSCOPY  [id number]   left and right  . NASAL SINUS SURGERY  05/2015  . TOTAL KNEE ARTHROPLASTY Right 06/24/2016   Procedure: TOTAL KNEE ARTHROPLASTY;  Surgeon: Garald Balding, MD;  Location: [place];  Service: Orthopedics;  Laterality: Right;  . TRIGGER FINGER RELEASE Left 11/10/2013   Procedure: LEFT INDEX A-1 PULLEY RELEASE;  Surgeon: Cammie Sickle, MD;  Location: [place];  Service: Orthopedics;  Laterality: Left;  . ULNAR NERVE TRANSPOSITION Left 11/10/2013   Procedure: ULNAR NERVE TRANSPOSITION;  Surgeon: Cammie Sickle, MD;  Location: [place];  Service: Orthopedics;  Laterality: Left;    There were no vitals filed for this visit.       Subjective Assessment - 08/18/16 0802    Subjective Pt reports having pain in medial knee today.    Pertinent History Rt TKA 06/24/16   Limitations Sitting;Standing;Walking   How long can you sit comfortably? 30 minutes   How long can you stand comfortably? 15 minutes    How long can you walk comfortably? 15 minutes   Patient Stated Goals reduce pain, walk without device, improve Rt knee A/ROM, reduce edema   Currently in Pain? Yes   Pain Score 4    Pain Location Knee   Pain Orientation Right   Pain Descriptors / Indicators Sore   Pain Type Surgical pain   Pain Onset More than a month ago   Pain Frequency Constant                         [facility] Adult PT Treatment/Exercise - 08/18/16 0001      Knee/Hip Exercises: Stretches   Active Hamstring Stretch Both;2 reps;10 seconds   Passive Hamstring Stretch Right;1 rep;60 seconds  Knee hang with #5 over pressure   Quad Stretch Both;2 reps;10 seconds   Gastroc Stretch Right;3 reps;20 seconds  On slant board     Knee/Hip Exercises: Aerobic   Stationary Bike L3 x 8 min  PT present to discuss progress     Knee/Hip Exercises: Machines for Strengthening   Cybex Knee Extension #  25 2x10   lower slowly for eccentric contraction   Cybex Leg Press 100# bil knee 3x10, 55# on Rt 3x10  Seat 6     Knee/Hip Exercises: Standing   Walking with Sports Cord 30# 4 ways x10 each                  PT Short Term Goals - 08/11/16 0803      PT SHORT TERM GOAL #3   Title demonstrate Rt knee A/ROM extension to lacking < or = to 10 degrees to normalize gait   Baseline -5 AROM   Time 4   Period Weeks   Status Achieved     PT SHORT TERM GOAL #4   Title demonstrate Rt quad strength > or = to 4/5 without lag to improve Rt knee stability   Time 4   Period Weeks   Status Achieved           PT Long Term Goals - 08/18/16 5631      PT LONG TERM GOAL #1   Title be independent in advanced HEP   Time 8   Period Weeks    Status On-going     PT LONG TERM GOAL #2   Title reduce FOTO to < or = to 49% limitation   Time 8   Period Weeks   Status On-going     PT LONG TERM GOAL #4   Title demonstrate 4+/5 Rt hip and knee strength to improve safety and endurance for gait in the community and return to work   Time 8   Period Weeks   Status On-going     PT LONG TERM GOAL #5   Title demonstrate Rt knee A/ROM lacking < or = to 5 degrees extension to 110 flexion to improve mobility without substitution   Time 8   Period Weeks   Status On-going               Plan - 08/18/16 4970    Clinical Impression Statement Pt with continued tightness and pain in posterior knee and pes anserine. Pt continued to have difficulty with eccentric contraction for descending stairs. Doing well with qud strength and stability progression. Pt will continue to benfit from skilled therapy for LE strengthening and stability.    Rehab Potential Good   Clinical Impairments Affecting Rehab Potential None   PT Frequency 3x / week   PT Duration 8 weeks   PT Treatment/Interventions ADLs/Self Care Home Management;Cryotherapy;Electrical Stimulation;Functional mobility training;Stair training;Gait training;Ultrasound;Moist Heat;Therapeutic activities;Therapeutic exercise;Neuromuscular re-education;Balance training;Patient/family education;Passive range of motion;Scar mobilization;Manual techniques;Taping;Vasopneumatic Device   PT Next Visit Plan eccentric quad control, ROM, stairs   Consulted and Agree with Plan of Care Patient      Patient will benefit from skilled therapeutic intervention in order to improve the following deficits and impairments:  Abnormal gait, Decreased range of motion, Decreased endurance, Decreased activity tolerance, Pain, Decreased strength, Increased edema, Decreased balance, Hypomobility, Impaired flexibility  Visit Diagnosis: Acute pain of right knee  Other abnormalities of gait and mobility  Muscle  weakness (generalized)  Stiffness of right knee, not elsewhere classified  Localized edema     Problem List Patient Active Problem List   Diagnosis Date Noted  . Unilateral primary osteoarthritis, right knee 06/24/2016  . S/P total knee replacement using cement, right 06/24/2016  . HYPERLIPIDEMIA 01/18/2007  . HYPERTENSION 01/18/2007    Mikle Bosworth PTA 08/18/2016, 9:03 AM  [facility] [address]  [place], [place], [address] Phone: [phone]   Fax:  [phone]  Name: Philip Winters MRN: [id number] Date of Birth: [birth year]

## 2016-08-20 ENCOUNTER — Encounter: Payer: Self-pay | Admitting: Physical Therapy

## 2016-08-20 ENCOUNTER — Ambulatory Visit: Payer: 59 | Admitting: Physical Therapy

## 2016-08-20 DIAGNOSIS — M6281 Muscle weakness (generalized): Secondary | ICD-10-CM

## 2016-08-20 DIAGNOSIS — R2689 Other abnormalities of gait and mobility: Secondary | ICD-10-CM

## 2016-08-20 DIAGNOSIS — M25661 Stiffness of right knee, not elsewhere classified: Secondary | ICD-10-CM

## 2016-08-20 DIAGNOSIS — M25561 Pain in right knee: Secondary | ICD-10-CM | POA: Diagnosis not present

## 2016-08-20 DIAGNOSIS — R600 Localized edema: Secondary | ICD-10-CM

## 2016-08-20 NOTE — Therapy (Signed)
[facility] [address], [place], [place], [address] Phone: [phone]   Fax:  [phone]  Physical Therapy Treatment  Patient Details  Name: Philip Winters MRN: [id number] Date of Birth: 1961/04/24 Referring Provider: Joni Fears, MD  Encounter Date: 08/20/2016      PT End of Session - 08/20/16 0802    Visit Number 17   Date for PT Re-Evaluation 09/03/16   Authorization Time Period 23 visit limit   Authorization - Visit Number 91   Authorization - Number of Visits 23   PT Start Time 0800   PT Stop Time 0840   PT Time Calculation (min) 40 min   Activity Tolerance Patient tolerated treatment well   Behavior During Therapy [facility] for tasks assessed/performed      Past Medical History:  Diagnosis Date  . Allergy   . Arthritis   . GERD (gastroesophageal reflux disease)    occ  . Heart murmur    baby  . Hypertension   . Seasonal allergies     Past Surgical History:  Procedure Laterality Date  . CARPAL TUNNEL WITH CUBITAL TUNNEL Left 11/10/2013   Procedure: LEFT CARPAL TUNNEL RELEASE;  Surgeon: Cammie Sickle, MD;  Location: [place];  Service: Orthopedics;  Laterality: Left;  . COLONOSCOPY    . KNEE ARTHROSCOPY  9937,[id number]   left and right  . NASAL SINUS SURGERY  05/2015  . TOTAL KNEE ARTHROPLASTY Right 06/24/2016   Procedure: TOTAL KNEE ARTHROPLASTY;  Surgeon: Garald Balding, MD;  Location: [place];  Service: Orthopedics;  Laterality: Right;  . TRIGGER FINGER RELEASE Left 11/10/2013   Procedure: LEFT INDEX A-1 PULLEY RELEASE;  Surgeon: Cammie Sickle, MD;  Location: [place];  Service: Orthopedics;  Laterality: Left;  . ULNAR NERVE TRANSPOSITION Left 11/10/2013   Procedure: ULNAR NERVE TRANSPOSITION;  Surgeon: Cammie Sickle, MD;  Location: [place];  Service: Orthopedics;  Laterality: Left;    There were no vitals filed for this visit.       Subjective Assessment - 08/20/16 0801    Subjective With continued pain and feeling tiered today. I brought my garbage can up the driveway last night. It went well no pain. I don't think I could bring them down yet.   Pertinent History Rt TKA 06/24/16   Limitations Sitting;Standing;Walking   How long can you sit comfortably? 30 minutes   How long can you stand comfortably? 15 minutes    How long can you walk comfortably? 15 minutes   Patient Stated Goals reduce pain, walk without device, improve Rt knee A/ROM, reduce edema   Currently in Pain? Yes   Pain Score 4    Pain Location Knee   Pain Orientation Right   Pain Descriptors / Indicators Sore   Pain Type Surgical pain                         [facility] Adult PT Treatment/Exercise - 08/20/16 0001      Knee/Hip Exercises: Stretches   Active Hamstring Stretch Both;2 reps;10 seconds   Passive Hamstring Stretch Right;1 rep;60 seconds  Knee hang with #5 over pressure   Quad Stretch Both;2 reps;10 seconds   Gastroc Stretch Right;3 reps;20 seconds  On slant board     Knee/Hip Exercises: Aerobic   Stationary Bike L3 x 6 min  PT present to discuss progress     Knee/Hip Exercises:  Machines for Strengthening   Cybex Knee Extension #25 2x10   lower slowly for eccentric contraction   Cybex Leg Press 100# bil knee 3x10, 60# on Rt 3x10  Seat 6     Knee/Hip Exercises: Standing   Hip Abduction Stengthening;Both;2 sets;10 reps   Hip Extension Stengthening;Both;2 sets;10 reps   SLS 2 x 30 seconds on blue pod  with plyo ball toss                  PT Short Term Goals - 08/11/16 0803      PT SHORT TERM GOAL #3   Title demonstrate Rt knee A/ROM extension to lacking < or = to 10 degrees to normalize gait   Baseline -5 AROM   Time 4   Period Weeks   Status Achieved     PT SHORT TERM GOAL #4   Title demonstrate Rt quad strength > or = to 4/5 without lag to improve Rt knee stability   Time 4   Period Weeks    Status Achieved           PT Long Term Goals - 08/18/16 1610      PT LONG TERM GOAL #1   Title be independent in advanced HEP   Time 8   Period Weeks   Status On-going     PT LONG TERM GOAL #2   Title reduce FOTO to < or = to 49% limitation   Time 8   Period Weeks   Status On-going     PT LONG TERM GOAL #4   Title demonstrate 4+/5 Rt hip and knee strength to improve safety and endurance for gait in the community and return to work   Time 8   Period Weeks   Status On-going     PT LONG TERM GOAL #5   Title demonstrate Rt knee A/ROM lacking < or = to 5 degrees extension to 110 flexion to improve mobility without substitution   Time 8   Period Weeks   Status On-going               Plan - 08/20/16 0827    Clinical Impression Statement Pt having decreased ability for single leg stance needing toe touch for balance. Able to tolerate all strengthening well. Declined modalities stating he will ice at home and is unsure is manual massage is helping. Pt will continue to benefit from skilled therapy for strenght and stability.   Rehab Potential Good   Clinical Impairments Affecting Rehab Potential None   PT Frequency 3x / week   PT Duration 8 weeks   PT Treatment/Interventions ADLs/Self Care Home Management;Cryotherapy;Electrical Stimulation;Functional mobility training;Stair training;Gait training;Ultrasound;Moist Heat;Therapeutic activities;Therapeutic exercise;Neuromuscular re-education;Balance training;Patient/family education;Passive range of motion;Scar mobilization;Manual techniques;Taping;Vasopneumatic Device   PT Next Visit Plan eccentric quad control, ROM, stairs   Consulted and Agree with Plan of Care Patient      Patient will benefit from skilled therapeutic intervention in order to improve the following deficits and impairments:  Abnormal gait, Decreased range of motion, Decreased endurance, Decreased activity tolerance, Pain, Decreased strength, Increased  edema, Decreased balance, Hypomobility, Impaired flexibility  Visit Diagnosis: Acute pain of right knee  Other abnormalities of gait and mobility  Muscle weakness (generalized)  Stiffness of right knee, not elsewhere classified  Localized edema     Problem List Patient Active Problem List   Diagnosis Date Noted  . Unilateral primary osteoarthritis, right knee 06/24/2016  . S/P total knee replacement using cement, right 06/24/2016  .  HYPERLIPIDEMIA 01/18/2007  . HYPERTENSION 01/18/2007    Philip Winters PTA 08/20/2016, 8:42 AM  [facility] [address] [place] [address], [place], [place], [address] Phone: [phone]   Fax:  [phone]  Name: Philip Winters MRN: [id number] Date of Birth: [birth year]

## 2016-08-22 ENCOUNTER — Ambulatory Visit: Payer: 59 | Admitting: Physical Therapy

## 2016-08-22 ENCOUNTER — Encounter: Payer: Self-pay | Admitting: Physical Therapy

## 2016-08-22 DIAGNOSIS — R600 Localized edema: Secondary | ICD-10-CM

## 2016-08-22 DIAGNOSIS — M6281 Muscle weakness (generalized): Secondary | ICD-10-CM

## 2016-08-22 DIAGNOSIS — R2689 Other abnormalities of gait and mobility: Secondary | ICD-10-CM

## 2016-08-22 DIAGNOSIS — M25561 Pain in right knee: Secondary | ICD-10-CM

## 2016-08-22 DIAGNOSIS — M25661 Stiffness of right knee, not elsewhere classified: Secondary | ICD-10-CM

## 2016-08-22 NOTE — Therapy (Signed)
[name] [address], [place], [place], [address] Phone: [phone]   Fax:  [phone]  Physical Therapy Treatment  Patient Details  Name: Philip Winters MRN: [id number] Date of Birth: 29-Apr-1961 Referring Provider: Joni Fears, MD  Encounter Date: 08/22/2016      PT End of Session - 08/22/16 0805    Visit Number 18   Date for PT Re-Evaluation 09/03/16   Authorization Time Period 23 visit limit   Authorization - Visit Number 74   Authorization - Number of Visits 23   PT Start Time 0800   PT Stop Time 0842   PT Time Calculation (min) 42 min   Activity Tolerance Patient tolerated treatment well   Behavior During Therapy [facility] for tasks assessed/performed      Past Medical History:  Diagnosis Date  . Allergy   . Arthritis   . GERD (gastroesophageal reflux disease)    occ  . Heart murmur    baby  . Hypertension   . Seasonal allergies     Past Surgical History:  Procedure Laterality Date  . CARPAL TUNNEL WITH CUBITAL TUNNEL Left 11/10/2013   Procedure: LEFT CARPAL TUNNEL RELEASE;  Surgeon: Cammie Sickle, MD;  Location: [place];  Service: Orthopedics;  Laterality: Left;  . COLONOSCOPY    . KNEE ARTHROSCOPY  [id number]   left and right  . NASAL SINUS SURGERY  05/2015  . TOTAL KNEE ARTHROPLASTY Right 06/24/2016   Procedure: TOTAL KNEE ARTHROPLASTY;  Surgeon: Garald Balding, MD;  Location: [place];  Service: Orthopedics;  Laterality: Right;  . TRIGGER FINGER RELEASE Left 11/10/2013   Procedure: LEFT INDEX A-1 PULLEY RELEASE;  Surgeon: Cammie Sickle, MD;  Location: [place];  Service: Orthopedics;  Laterality: Left;  . ULNAR NERVE TRANSPOSITION Left 11/10/2013   Procedure: ULNAR NERVE TRANSPOSITION;  Surgeon: Cammie Sickle, MD;  Location: [place];  Service: Orthopedics;  Laterality: Left;    There were no vitals filed for this visit.       Subjective Assessment - 08/22/16 0803    Subjective Knee hurting today.    Pertinent History Rt TKA 06/24/16   Limitations Sitting;Standing;Walking   How long can you sit comfortably? 30 minutes   How long can you stand comfortably? 15 minutes    How long can you walk comfortably? 15 minutes   Patient Stated Goals reduce pain, walk without device, improve Rt knee A/ROM, reduce edema   Currently in Pain? Yes   Pain Score 5    Pain Location Knee   Pain Orientation Right   Pain Descriptors / Indicators Sore   Pain Type Surgical pain   Pain Onset More than a month ago   Pain Frequency Constant   Aggravating Factors  walking a lot, sometimes no pattern   Pain Relieving Factors ice, rest, Advil                         [facility] Adult PT Treatment/Exercise - 08/22/16 0001      Knee/Hip Exercises: Stretches   Gastroc Stretch Right;3 reps;20 seconds  On slant board     Knee/Hip Exercises: Aerobic   Stationary Bike L3 x 6 min  PT present to discuss progress     Knee/Hip Exercises: Machines for Strengthening   Cybex Leg Press 100# bil knee 3x10, 60# on Rt 3x10  Seat 6     Knee/Hip  Exercises: Standing   SLS 2 x 30 seconds on blue pod  with plyo ball toss   Walking with Sports Cord 30# 4 ways x10 each     Manual Therapy   Manual Therapy Soft tissue mobilization;Joint mobilization   Soft tissue mobilization hamstring and popltius, distal quad and scar                  PT Short Term Goals - 08/11/16 0803      PT SHORT TERM GOAL #3   Title demonstrate Rt knee A/ROM extension to lacking < or = to 10 degrees to normalize gait   Baseline -5 AROM   Time 4   Period Weeks   Status Achieved     PT SHORT TERM GOAL #4   Title demonstrate Rt quad strength > or = to 4/5 without lag to improve Rt knee stability   Time 4   Period Weeks   Status Achieved           PT Long Term Goals - 08/18/16 8315      PT LONG TERM GOAL #1   Title be independent in  advanced HEP   Time 8   Period Weeks   Status On-going     PT LONG TERM GOAL #2   Title reduce FOTO to < or = to 49% limitation   Time 8   Period Weeks   Status On-going     PT LONG TERM GOAL #4   Title demonstrate 4+/5 Rt hip and knee strength to improve safety and endurance for gait in the community and return to work   Time 8   Period Weeks   Status On-going     PT LONG TERM GOAL #5   Title demonstrate Rt knee A/ROM lacking < or = to 5 degrees extension to 110 flexion to improve mobility without substitution   Time 8   Period Weeks   Status On-going               Plan - 08/22/16 0847    Clinical Impression Statement Pt with increased pain in knee. Able to tolerate all exercises well. Continues to have increase tightness in posterior knee. Pt will continue to benefit from skilled therapy for strength and stability.    Rehab Potential Good   Clinical Impairments Affecting Rehab Potential None   PT Frequency 3x / week   PT Duration 8 weeks   PT Treatment/Interventions ADLs/Self Care Home Management;Cryotherapy;Electrical Stimulation;Functional mobility training;Stair training;Gait training;Ultrasound;Moist Heat;Therapeutic activities;Therapeutic exercise;Neuromuscular re-education;Balance training;Patient/family education;Passive range of motion;Scar mobilization;Manual techniques;Taping;Vasopneumatic Device   PT Next Visit Plan eccentric quad control, ROM, stairs   Consulted and Agree with Plan of Care Patient      Patient will benefit from skilled therapeutic intervention in order to improve the following deficits and impairments:  Abnormal gait, Decreased range of motion, Decreased endurance, Decreased activity tolerance, Pain, Decreased strength, Increased edema, Decreased balance, Hypomobility, Impaired flexibility  Visit Diagnosis: Acute pain of right knee  Other abnormalities of gait and mobility  Muscle weakness (generalized)  Stiffness of right knee, not  elsewhere classified  Localized edema     Problem List Patient Active Problem List   Diagnosis Date Noted  . Unilateral primary osteoarthritis, right knee 06/24/2016  . S/P total knee replacement using cement, right 06/24/2016  . HYPERLIPIDEMIA 01/18/2007  . HYPERTENSION 01/18/2007    Mikle Bosworth PTA 08/22/2016, 8:53 AM  [facility] [address]  [place], [place], [address] Phone: [phone]   Fax:  [phone]  Name: Philip Winters MRN: [id number] Date of Birth: 01-10-1961

## 2016-08-25 ENCOUNTER — Encounter: Payer: 59 | Admitting: Physical Therapy

## 2016-08-27 ENCOUNTER — Ambulatory Visit: Payer: 59 | Admitting: Physical Therapy

## 2016-08-27 ENCOUNTER — Encounter: Payer: Self-pay | Admitting: Physical Therapy

## 2016-08-27 DIAGNOSIS — M25661 Stiffness of right knee, not elsewhere classified: Secondary | ICD-10-CM

## 2016-08-27 DIAGNOSIS — M25561 Pain in right knee: Secondary | ICD-10-CM | POA: Diagnosis not present

## 2016-08-27 DIAGNOSIS — M6281 Muscle weakness (generalized): Secondary | ICD-10-CM

## 2016-08-27 DIAGNOSIS — R2689 Other abnormalities of gait and mobility: Secondary | ICD-10-CM

## 2016-08-27 NOTE — Therapy (Signed)
[facility] [address] [place], [place], [address] Phone: [phone]   Fax:  [phone]  Physical Therapy Treatment  Patient Details  Name: Philip Winters MRN: [id number] Date of Birth: [birth year] Referring Provider: Joni Fears, MD  Encounter Date: 08/27/2016      PT End of Session - 08/27/16 0758    Visit Number 19   Date for PT Re-Evaluation 09/03/16   Authorization Time Period 23 visit limit   Authorization - Visit Number 18   Authorization - Number of Visits 23   PT Start Time 0758   PT Stop Time 0848   PT Time Calculation (min) 50 min   Activity Tolerance Patient tolerated treatment well   Behavior During Therapy [facility] for tasks assessed/performed      Past Medical History:  Diagnosis Date  . Allergy   . Arthritis   . GERD (gastroesophageal reflux disease)    occ  . Heart murmur    baby  . Hypertension   . Seasonal allergies     Past Surgical History:  Procedure Laterality Date  . CARPAL TUNNEL WITH CUBITAL TUNNEL Left 11/10/2013   Procedure: LEFT CARPAL TUNNEL RELEASE;  Surgeon: Cammie Sickle, MD;  Location: [place];  Service: Orthopedics;  Laterality: Left;  . COLONOSCOPY    . KNEE ARTHROSCOPY  [id number]   left and right  . NASAL SINUS SURGERY  05/2015  . TOTAL KNEE ARTHROPLASTY Right 06/24/2016   Procedure: TOTAL KNEE ARTHROPLASTY;  Surgeon: Garald Balding, MD;  Location: [place];  Service: Orthopedics;  Laterality: Right;  . TRIGGER FINGER RELEASE Left 11/10/2013   Procedure: LEFT INDEX A-1 PULLEY RELEASE;  Surgeon: Cammie Sickle, MD;  Location: [place];  Service: Orthopedics;  Laterality: Left;  . ULNAR NERVE TRANSPOSITION Left 11/10/2013   Procedure: ULNAR NERVE TRANSPOSITION;  Surgeon: Cammie Sickle, MD;  Location: [place];  Service: Orthopedics;  Laterality: Left;    There were Winters vitals filed for this visit.       Subjective Assessment - 08/27/16 0759    Subjective Was sick Monday, feels better today. Knee is popping this AM.    Currently in Pain? Yes   Pain Score 3    Pain Location Knee   Pain Orientation Right   Pain Descriptors / Indicators Sore;Aching   Aggravating Factors  Constant   Pain Relieving Factors rest, advil, ice, heat   Multiple Pain Sites Winters            [facility] PT Assessment - 08/27/16 0001      AROM   Overall AROM  --  8-115                     [facility] Adult PT Treatment/Exercise - 08/27/16 0001      Knee/Hip Exercises: Aerobic   Stationary Bike L3 x 10 min  Review of status     Knee/Hip Exercises: Machines for Strengthening   Cybex Knee Extension #25 2x15   Cybex Leg Press 100# Bil knee 10x, 110# 2x 10, 60# RTLE 10x, 65# 2x 10      Knee/Hip Exercises: Standing   Forward Step Up Right;1 set;20 reps;Hand Hold: 1  On BOSU ball   SLS 4 x 30 sec but wobbly   Walking with Sports Cord 30# 4 ways x10 each     Manual Therapy   Manual Therapy Soft tissue mobilization;Joint mobilization  Soft tissue mobilization hamstring and popltius, distal quad and scar                  PT Short Term Goals - 08/11/16 0803      PT SHORT TERM GOAL #3   Title demonstrate Rt knee A/ROM extension to lacking < or = to 10 degrees to normalize gait   Baseline -5 AROM   Time 4   Period Weeks   Status Achieved     PT SHORT TERM GOAL #4   Title demonstrate Rt quad strength > or = to 4/5 without lag to improve Rt knee stability   Time 4   Period Weeks   Status Achieved           PT Long Term Goals - 08/27/16 0849      PT LONG TERM GOAL #5   Title demonstrate Rt knee A/ROM lacking < or = to 5 degrees extension to 110 flexion to improve mobility without substitution   Time 8   Period Weeks   Status Partially Met  Met for flexion but not extension               Plan - 08/27/16 0801    Clinical Impression Statement Pt recently joined the gym.  He plans on going on his off days, opposite his therapy sessions. Pain remains constantly at low level, but there are particular days where the pain can significantly increase. There is Winters specific reason why this happens. He tolerated more weight on the leg press today without exacerbation. AROM is much improved in flexion but remains tight inot extension . Most challenging is balance on noncompliant surfaces.      Rehab Potential Good   Clinical Impairments Affecting Rehab Potential None   PT Frequency 3x / week   PT Duration 8 weeks   PT Treatment/Interventions ADLs/Self Care Home Management;Cryotherapy;Electrical Stimulation;Functional mobility training;Stair training;Gait training;Ultrasound;Moist Heat;Therapeutic activities;Therapeutic exercise;Neuromuscular re-education;Balance training;Patient/family education;Passive range of motion;Scar mobilization;Manual techniques;Taping;Vasopneumatic Device   PT Next Visit Plan To MD on MOnday, send note, FOTO, and work on extension and knee strength in standing   Consulted and Agree with Plan of Care Patient      Patient will benefit from skilled therapeutic intervention in order to improve the following deficits and impairments:  Abnormal gait, Decreased range of motion, Decreased endurance, Decreased activity tolerance, Pain, Decreased strength, Increased edema, Decreased balance, Hypomobility, Impaired flexibility  Visit Diagnosis: Acute pain of right knee  Other abnormalities of gait and mobility  Muscle weakness (generalized)  Stiffness of right knee, not elsewhere classified     Problem List Patient Active Problem List   Diagnosis Date Noted  . Unilateral primary osteoarthritis, right knee 06/24/2016  . S/P total knee replacement using cement, right 06/24/2016  . HYPERLIPIDEMIA 01/18/2007  . HYPERTENSION 01/18/2007    Linlee Cromie, PTA 08/27/2016, 8:51 AM  [facility] [address] [place], [place], [address] Phone: [phone]   Fax:  [phone]  Name: Philip Winters MRN: [id number] Date of Birth: [birth year]

## 2016-08-29 ENCOUNTER — Encounter: Payer: Self-pay | Admitting: Physical Therapy

## 2016-08-29 ENCOUNTER — Ambulatory Visit: Payer: 59 | Admitting: Physical Therapy

## 2016-08-29 DIAGNOSIS — R600 Localized edema: Secondary | ICD-10-CM

## 2016-08-29 DIAGNOSIS — M25661 Stiffness of right knee, not elsewhere classified: Secondary | ICD-10-CM

## 2016-08-29 DIAGNOSIS — R2689 Other abnormalities of gait and mobility: Secondary | ICD-10-CM

## 2016-08-29 DIAGNOSIS — M6281 Muscle weakness (generalized): Secondary | ICD-10-CM

## 2016-08-29 DIAGNOSIS — M25561 Pain in right knee: Secondary | ICD-10-CM | POA: Diagnosis not present

## 2016-08-29 NOTE — Therapy (Addendum)
[facility] [address], [place], [place], [address] Phone: [phone]   Fax:  [phone]  Physical Therapy Treatment  Patient Details  Name: Philip Winters MRN: [id number] Date of Birth: 02/29/1961 Referring Provider: Joni Fears, MD  Encounter Date: 08/29/2016      PT End of Session - 08/29/16 0802    Visit Number 20   Date for PT Re-Evaluation 09/03/16   Authorization Time Period 23 visit limit   Authorization - Visit Number 66   Authorization - Number of Visits 23   PT Start Time 0800   PT Stop Time 0845   PT Time Calculation (min) 45 min   Activity Tolerance Patient tolerated treatment well   Behavior During Therapy [facility] for tasks assessed/performed      Past Medical History:  Diagnosis Date  . Allergy   . Arthritis   . GERD (gastroesophageal reflux disease)    occ  . Heart murmur    baby  . Hypertension   . Seasonal allergies     Past Surgical History:  Procedure Laterality Date  . CARPAL TUNNEL WITH CUBITAL TUNNEL Left 11/10/2013   Procedure: LEFT CARPAL TUNNEL RELEASE;  Surgeon: Cammie Sickle, MD;  Location: [place];  Service: Orthopedics;  Laterality: Left;  . COLONOSCOPY    . KNEE ARTHROSCOPY  [id number]   left and right  . NASAL SINUS SURGERY  05/2015  . TOTAL KNEE ARTHROPLASTY Right 06/24/2016   Procedure: TOTAL KNEE ARTHROPLASTY;  Surgeon: Garald Balding, MD;  Location: [place];  Service: Orthopedics;  Laterality: Right;  . TRIGGER FINGER RELEASE Left 11/10/2013   Procedure: LEFT INDEX A-1 PULLEY RELEASE;  Surgeon: Cammie Sickle, MD;  Location: [place];  Service: Orthopedics;  Laterality: Left;  . ULNAR NERVE TRANSPOSITION Left 11/10/2013   Procedure: ULNAR NERVE TRANSPOSITION;  Surgeon: Cammie Sickle, MD;  Location: [place];  Service: Orthopedics;  Laterality: Left;    There were no vitals filed for this visit.       Subjective Assessment - 08/29/16 0801    Subjective Knee feels the same. Nerves are acting up on it yesterday and today. shooting pain.    Pertinent History Rt TKA 06/24/16   Limitations Sitting;Standing;Walking   How long can you sit comfortably? 30 minutes   How long can you stand comfortably? 15 minutes    How long can you walk comfortably? 15 minutes   Patient Stated Goals reduce pain, walk without device, improve Rt knee A/ROM, reduce edema   Currently in Pain? 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, stooping to floor     Neuro Re-ed    Neuro  Re-ed Details  balance, proprioception, single leg stability     Knee/Hip Exercises: Stretches   Passive Hamstring Stretch Right;1 rep;60 seconds  Knee hang with heat   Gastroc Stretch Right;3 reps;20 seconds  On slant board     Knee/Hip Exercises: Aerobic   Stationary Bike L3 x 5 min  Review of status     Knee/Hip Exercises: Machines for Strengthening   Cybex Knee Extension --     Knee/Hip Exercises: Standing   Forward Lunges Both;2 sets;5 reps   Forward Step Up Right;1 set;20 reps;Hand Hold: 1  On BOSU ball   SLS 4 x 30 sec but wobbly     Moist Heat Therapy   Number Minutes Moist Heat 10 Minutes   Moist Heat Location Knee  posterior knee concurrent with stretching     Manual Therapy   Manual Therapy Soft tissue mobilization;Joint mobilization   Soft tissue mobilization hamstring and popltius, distal quad and scar                   PT Short Term Goals - 08/29/16 5409      PT SHORT TERM GOAL #1   Title be independent in initial HEP   Status Achieved     PT SHORT TERM GOAL #2   Title wean from walker to use of cane for home distances   Status Achieved     PT SHORT TERM GOAL #3   Title demonstrate Rt knee A/ROM extension to lacking < or = to 10 degrees to normalize gait   Status Achieved     PT SHORT TERM GOAL #4   Title demonstrate Rt quad strength > or = to 4/5 without lag to improve Rt knee stability   Status Achieved     PT SHORT TERM GOAL #5   Title improve endurance to walk for 20-25 minutes without need to rest   Status Achieved           PT Long Term Goals - 08/29/16 0839      PT LONG TERM GOAL #2   Title reduce FOTO to < or = to 49% limitation   Baseline 27%   Status Achieved     PT LONG TERM GOAL #3   Title wean from assistive device on level surface and demonstrate symmetry   Status Achieved     PT LONG TERM GOAL #4   Title demonstrate 4+/5 Rt hip and knee strength to improve safety and endurance for gait in the community and return to work   Baseline 4/5   Status On-going     PT LONG TERM GOAL #5   Title demonstrate Rt knee A/ROM lacking < or = to 5 degrees extension to 110 flexion to improve mobility without substitution   Baseline -5 : 110   Status Achieved     PT LONG TERM GOAL #6   Title report < or 3/10 Rt knee pain with standing and walking   Status Achieved               Plan - 08/29/16 0832    Clinical Impression Statement Pt continues to progress with single leg stance. Stated he feels instable on side of knee with single leg stance. Pt practiced functional squat with lifting weight to simulate his tool box and practiced lunges to simulate working conditions. Pt did well with all exercises. Continues to have tightness in posterior knee. Pt will continue to benefit from skilled therapy for knee strength and stability and  flexibility to  prepare to return to work.    Rehab Potential Good   Clinical Impairments Affecting Rehab Potential None   PT Frequency 3x / week   PT Duration 8 weeks   PT Treatment/Interventions ADLs/Self Care Home Management;Cryotherapy;Electrical Stimulation;Functional mobility training;Stair training;Gait training;Ultrasound;Moist Heat;Therapeutic activities;Therapeutic exercise;Neuromuscular re-education;Balance training;Patient/family education;Passive range of motion;Scar mobilization;Manual techniques;Taping;Vasopneumatic Device   PT Next Visit Plan MD on Monday. Update goals. Possibly reduce frequency per week.    Consulted and Agree with Plan of Care Patient      Patient will benefit from skilled therapeutic intervention in order to improve the following deficits and impairments:  Abnormal gait, Decreased range of motion, Decreased endurance, Decreased activity tolerance, Pain, Decreased strength, Increased edema, Decreased balance, Hypomobility, Impaired flexibility  Visit Diagnosis: Acute pain of right knee  Other abnormalities of gait and mobility  Muscle weakness (generalized)  Stiffness of right knee, not elsewhere classified  Localized edema     Problem List Patient Active Problem List   Diagnosis Date Noted  . Unilateral primary osteoarthritis, right knee 06/24/2016  . S/P total knee replacement using cement, right 06/24/2016  . HYPERLIPIDEMIA 01/18/2007  . HYPERTENSION 01/18/2007    Philip Winters PTA 08/29/2016, 8:53 AM PHYSICAL THERAPY DISCHARGE SUMMARY  Visits from Start of Care: 20  Current functional level related to goals / functional outcomes: See above for current status.  Pt didn't return to PT.     Remaining deficits: See above for current status.     Education / Equipment: HEP Plan: Patient agrees to discharge.  Patient goals were partially met. Patient is being discharged due to not returning since the last visit.  ?????        Philip Winters,  PT 10/23/16 10:48 AM   [facility] [address], [place], [place], [address] Phone: [phone]   Fax:  [phone]  Name: Philip Winters MRN: [id number] Date of Birth: 1960/07/03

## 2016-09-01 ENCOUNTER — Encounter (INDEPENDENT_AMBULATORY_CARE_PROVIDER_SITE_OTHER): Payer: Self-pay | Admitting: Orthopaedic Surgery

## 2016-09-01 ENCOUNTER — Ambulatory Visit (INDEPENDENT_AMBULATORY_CARE_PROVIDER_SITE_OTHER): Payer: 59 | Admitting: Orthopaedic Surgery

## 2016-09-01 VITALS — BP 131/91 | HR 101 | Ht 69.0 in | Wt 164.0 lb

## 2016-09-01 DIAGNOSIS — Z96651 Presence of right artificial knee joint: Secondary | ICD-10-CM

## 2016-09-01 NOTE — Progress Notes (Signed)
Office Visit Note   Patient: Philip Winters           Date of Birth: [birth year]           MRN: [id number] Visit Date: 09/01/2016              Requested by: Antony Contras, MD [place], [place] [address] PCP: Gara Kroner, MD   Assessment & Plan: Visit Diagnoses:  1. 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   Orders:  No orders of the defined types were placed in this encounter.  No orders of the defined types were placed in this encounter.     Procedures: No procedures performed   Clinical Data: No additional findings.   Subjective: Chief Complaint  Patient presents with  . Right Knee - Routine Post Op    [name] is a 56 year old male status post 9 weeks right knee replacement. He relates he hears "popping". He has decided to not do any more formal PT and continue with home exercises.  Just over 2 months status post right total knee replacement. Not using any ambulatory aid. Not taking any pain medicine. Very happy with the results. Has finished a course of physical therapy and has 115 of flexion. HPI  Review of Systems   Objective: Vital Signs: BP (!) 131/91   Pulse (!) 101   Ht 5\' 9"  (1.753 m)   Wt 164 lb (74.4 kg)   BMI 24.22 kg/m   Physical Exam  Ortho Exam awake alert and oriented 3. Walks without a limp. Right knee incision is healed nicely without any problems. No knee effusion. No instability. 115 of flexion and lacks about 5 to full extension. No calf pain no swelling distally neurovascular exam intact. Has had some pain in the popliteal area laterally but I did not feel any masses no localized tenderness today no popliteal cyst  Specialty Comments:  No specialty comments  available.  Imaging: No results found.   PMFS History: Patient Active Problem List   Diagnosis Date Noted  . Unilateral primary osteoarthritis, right knee 06/24/2016  . S/P total knee replacement using cement, right 06/24/2016  . HYPERLIPIDEMIA 01/18/2007  . HYPERTENSION 01/18/2007   Past Medical History:  Diagnosis Date  . Allergy   . Arthritis   . GERD (gastroesophageal reflux disease)    occ  . Heart murmur    baby  . Hypertension   . Seasonal allergies     No family history on file.  Past Surgical History:  Procedure Laterality Date  . CARPAL TUNNEL WITH CUBITAL TUNNEL Left 11/10/2013   Procedure: LEFT CARPAL TUNNEL RELEASE;  Surgeon: Cammie Sickle, MD;  Location: [place];  Service: Orthopedics;  Laterality: Left;  . COLONOSCOPY    . KNEE ARTHROSCOPY  1027,2536   left and right  . NASAL SINUS SURGERY  05/2015  . TOTAL KNEE ARTHROPLASTY Right 06/24/2016   Procedure: TOTAL KNEE ARTHROPLASTY;  Surgeon: Garald Balding, MD;  Location: [place];  Service: Orthopedics;  Laterality: Right;  . TRIGGER FINGER RELEASE Left 11/10/2013   Procedure: LEFT INDEX A-1 PULLEY RELEASE;  Surgeon: Cammie Sickle, MD;  Location: [place];  Service: Orthopedics;  Laterality: Left;  . ULNAR NERVE TRANSPOSITION Left 11/10/2013   Procedure: ULNAR NERVE TRANSPOSITION;  Surgeon: Cammie Sickle, MD;  Location: [place];  Service: Orthopedics;  Laterality: Left;   Social History   Occupational History  . Not on file.   Social History Main Topics  . Smoking status: Former Smoker    Packs/day: 1.00    Years: 37.00    Types: Cigarettes    Quit date: 05/04/2016  . Smokeless tobacco: Never Used  . Alcohol use 0.0 oz/week     Comment: occ  . Drug use: No  . Sexual activity: Not on file

## 2016-09-16 ENCOUNTER — Telehealth (INDEPENDENT_AMBULATORY_CARE_PROVIDER_SITE_OTHER): Payer: Self-pay | Admitting: Orthopaedic Surgery

## 2016-09-16 NOTE — Telephone Encounter (Signed)
Patient scheduled an appt for a teeth cleaning. Dentist office advised patient needs an antibotic before appt due to TKR. Patient uses [facility]. Dental appt scheduled for May 31st. Please call patient with directions.

## 2016-09-18 ENCOUNTER — Encounter: Payer: Self-pay | Admitting: Emergency Medicine

## 2016-09-18 ENCOUNTER — Other Ambulatory Visit (INDEPENDENT_AMBULATORY_CARE_PROVIDER_SITE_OTHER): Payer: Self-pay

## 2016-09-18 ENCOUNTER — Ambulatory Visit (INDEPENDENT_AMBULATORY_CARE_PROVIDER_SITE_OTHER): Payer: 59 | Admitting: Physician Assistant

## 2016-09-18 VITALS — BP 125/88 | HR 126 | Temp 98.6°F | Resp 16

## 2016-09-18 DIAGNOSIS — S41111A Laceration without foreign body of right upper arm, initial encounter: Secondary | ICD-10-CM | POA: Diagnosis not present

## 2016-09-18 DIAGNOSIS — Z23 Encounter for immunization: Secondary | ICD-10-CM

## 2016-09-18 MED ORDER — CLINDAMYCIN HCL 300 MG PO CAPS: 600.0000 mg | ORAL_CAPSULE | Freq: Every day | ORAL | 0 refills | Status: DC | PRN

## 2016-09-18 MED ORDER — DOXYCYCLINE HYCLATE 100 MG PO TABS: 100.0000 mg | ORAL_TABLET | Freq: Two times a day (BID) | ORAL | 0 refills | Status: DC

## 2016-09-18 NOTE — Patient Instructions (Addendum)
  WOUND CARE Please return in 7 days to have your stitches/staples removed or sooner if you have concerns. Marland Kitchen Keep area clean and dry with dressing in place for 24 hours. . After 24 hours, remove bandage and wash wound gently with mild soap and warm water. When skin is dry, reapply a new bandage. Once wound is no longer draining, may leave wound open to air. . Continue daily cleansing with soap and water until stitches/staples are removed. . Do not apply any ointments or creams to the wound while stitches/staples are in place, as this may cause delayed healing. . Notify the office if you experience any of the following signs of infection: Swelling, redness, pus drainage, streaking, fever >101.0 F . Notify the office if you experience excessive bleeding that does not stop after 15-20 minutes of constant, firm pressure.  Thank you for coming in today. I hope you feel we met your needs.  Feel free to call [facility] if you have any questions or further requests.  Please consider signing up for MyChart if you do not already have it, as this is a great way to communicate with me.  Best,  [employer], PA-C

## 2016-09-18 NOTE — Telephone Encounter (Signed)
LVMOM with directions for antibiotic

## 2016-09-18 NOTE — Progress Notes (Signed)
Philip Winters  MRN: [id number] DOB: 10-30-1960  PCP: Gara Kroner, MD  Subjective:  Pt is a 56 year old male who presents to clinic for wound. He was cleaning his grill this morning when his arm slipped and he sliced it on a sharp stainless steel edge of the grill. C/o cut to his right forearm. "I felt it hit the bone". He has not cleaned it.  Not UTD tetanus.  He is not on blood thinners. Denies chills, fever, syncope, swelling, joint pain, n/t, muscle weakness, decreased ROM.   Review of Systems  Musculoskeletal: Negative for arthralgias, joint swelling and myalgias.  Skin: Positive for wound.  Neurological: Negative for weakness and numbness.    Patient Active Problem List   Diagnosis Date Noted  . Unilateral primary osteoarthritis, right knee 06/24/2016  . S/P total knee replacement using cement, right 06/24/2016  . HYPERLIPIDEMIA 01/18/2007  . HYPERTENSION 01/18/2007    Current Outpatient Prescriptions on File Prior to Visit  Medication Sig Dispense Refill  . acetaminophen (TYLENOL) 500 MG tablet Take 2 tablets (1,000 mg total) by mouth every 6 (six) hours as needed. 30 tablet 0  . clindamycin (CLEOCIN) 300 MG capsule Take 2 capsules (600 mg total) by mouth daily as needed. PRN dental procedure 12 capsule 0  . ibuprofen (ADVIL,MOTRIN) 100 MG tablet Take 100 mg by mouth every 6 (six) hours as needed for fever.    . methocarbamol (ROBAXIN) 500 MG tablet Take 1 tablet (500 mg total) by mouth every 6 (six) hours as needed for muscle spasms. (Patient not taking: Reported on 07/28/2016) 30 tablet 0  . Multiple Vitamin (MULTIVITAMIN WITH MINERALS) TABS tablet Take 1 tablet by mouth daily.    . Tetrahydrozoline HCl (VISINE OP) Apply 1 drop to eye daily.    . valsartan (DIOVAN) 320 MG tablet Take 320 mg by mouth daily.     No current facility-administered medications on file prior to visit.     Allergies  Allergen Reactions  . Mushroom Extract Complex Anaphylaxis  . Penicillins  Shortness Of Breath    Childhood allergy.  Has patient had a PCN reaction causing immediate rash, facial/tongue/throat swelling, SOB or lightheadedness with hypotension: No Has patient had a PCN reaction causing severe rash involving mucus membranes or skin necrosis: No Has patient had a PCN reaction that required hospitalization No Has patient had a PCN reaction occurring within the last 10 years: No If all of the above answers are "NO", then may proceed with C  . Codeine Nausea And Vomiting  . Iodine Rash     Objective:  BP 125/88   Pulse (!) 126   Temp 98.6 F (37 C) (Oral)   Resp 16   SpO2 95%   Physical Exam  Constitutional: He is oriented to person, place, and time and well-developed, well-nourished, and in no distress. No distress.  Cardiovascular: Normal rate, regular rhythm and normal heart sounds.   Neurological: He is alert and oriented to person, place, and time. He has normal motor skills, normal sensation, normal strength and normal reflexes. GCS score is 15.  Skin: Skin is warm and dry.  5cm horseshoe-shaped laceration anterior right forearm. Wound anesthetized with 2& lidocaine with epi. Wound explored. Muscle fascia appears to be torn and bone was appreciated. Bony surface is smooth and does not appear to be damaged.  Psychiatric: Mood, memory, affect and judgment normal.  Vitals reviewed.  Procedure: Verbal consent obtained. Skin was anesthetized with 2% lidocaine with epinephrine and  cleaned with soap and water. Wound irrigated with normal saline. Wound was explored and deep structures are involved. 2 subcutaneous sutures placed to repair muscle fascia. Laceration was sutured with 4-0 ethilon. Wound was dressed and wound care discussed.  Assessment and Plan :  1. Laceration of right upper extremity, initial encounter - doxycycline (VIBRA-TABS) 100 MG tablet; Take 1 tablet (100 mg total) by mouth 2 (two) times daily.  Dispense: 20 tablet; Refill: 0 - Full-thickness  laceration to bone. Will cover with Doxycycline. Wound care discussed as well as red flag symptoms. RTC in 10 days for suture removal.   2. Need for diphtheria-tetanus-pertussis (Tdap) vaccine - Tdap vaccine greater than or equal to 7yo IM   Mercer Pod, PA-C  [facility] 09/18/2016 10:14 AM

## 2016-09-19 ENCOUNTER — Ambulatory Visit (INDEPENDENT_AMBULATORY_CARE_PROVIDER_SITE_OTHER): Payer: 59 | Admitting: Physician Assistant

## 2016-09-19 ENCOUNTER — Encounter: Payer: Self-pay | Admitting: Physician Assistant

## 2016-09-19 VITALS — BP 126/77 | HR 100 | Temp 98.2°F | Resp 16 | Ht 68.0 in | Wt 154.0 lb

## 2016-09-19 DIAGNOSIS — Z4889 Encounter for other specified surgical aftercare: Secondary | ICD-10-CM

## 2016-09-19 NOTE — Progress Notes (Signed)
  09/19/2016 9:17 AM   DOB: 1960-07-11 / MRN: [id number]  SUBJECTIVE:  Philip Winters is a 56 y.o. male presenting for bleeding from a laceration repari that occurred yesterday.  He denies any pain today.   He is allergic to mushroom extract complex; penicillins; codeine; and iodine.    Review of Systems  Neurological: Negative for dizziness, tingling, focal weakness and headaches.    The problem list and medications were reviewed and updated by myself where necessary and exist elsewhere in the encounter.   OBJECTIVE:  BP 126/77   Pulse 100   Temp 98.2 F (36.8 C) (Oral)   Resp 16   Ht 5\' 8"  (1.727 m)   Wt 154 lb (69.9 kg)   SpO2 98%   BMI 23.42 kg/m   Physical Exam  Constitutional: He is active and cooperative.  Cardiovascular: Normal rate.   Pulmonary/Chest: Effort normal. No tachypnea.  Musculoskeletal:       Arms: Neurological: He is alert.  Skin: Skin is warm.  Vitals reviewed.   No results found for this or any previous visit (from the past 72 hour(s)).  No results found.  ASSESSMENT AND PLAN:  Philip Winters was seen today for wound check.  Diagnoses and all orders for this visit:  Encounter for postoperative wound care: Wound well approximated.  He continue to bleed but dose take a baby asa daily.      The patient is advised to call or return to clinic if he does not see an improvement in symptoms, or to seek the care of the closest emergency department if he worsens with the above plan.   Philip Winters, MHS, PA-C [facility] 09/19/2016 9:17 AM

## 2016-09-19 NOTE — Patient Instructions (Signed)
     IF you received an x-ray today, you will receive an invoice from [facility]. Please contact [facility] at [phone] with questions or concerns regarding your invoice.   IF you received labwork today, you will receive an invoice from [facility]. Please contact [facility] at [phone] with questions or concerns regarding your invoice.   Our billing staff will not be able to assist you with questions regarding bills from these companies.  You will be contacted with the lab results as soon as they are available. The fastest way to get your results is to activate your My Chart account. Instructions are located on the last page of this paperwork. If you have not heard from us regarding the results in 2 weeks, please contact this office.     

## 2016-09-23 DIAGNOSIS — I10 Essential (primary) hypertension: Secondary | ICD-10-CM | POA: Diagnosis not present

## 2016-09-23 DIAGNOSIS — S51811D Laceration without foreign body of right forearm, subsequent encounter: Secondary | ICD-10-CM | POA: Diagnosis not present

## 2016-09-23 DIAGNOSIS — E782 Mixed hyperlipidemia: Secondary | ICD-10-CM | POA: Diagnosis not present

## 2016-09-25 ENCOUNTER — Ambulatory Visit (INDEPENDENT_AMBULATORY_CARE_PROVIDER_SITE_OTHER): Payer: 59 | Admitting: Family Medicine

## 2016-09-25 VITALS — BP 129/85 | HR 106 | Temp 98.2°F

## 2016-09-25 DIAGNOSIS — S41111D Laceration without foreign body of right upper arm, subsequent encounter: Secondary | ICD-10-CM

## 2016-09-25 NOTE — Patient Instructions (Signed)
It was good to meet you today.  We removed your stitches.  Your wound looks great.    Use antibiotic ointment on it for the next few days and keep it covered with a bandage.  By next week, you probably won't need to do this anymore.  If you notice any redness, drainage, bleeding from the area come back and see Philip Winters.  These could be signs of infection.

## 2016-09-25 NOTE — Progress Notes (Signed)
   Philip Winters is a 56 y.o. male who presents to Primary Care at [facility] today for FU for arm laceration:  1.  Arm laceration:  Patient cut arm while attempting to change out his girlfriend one week ago. Presented here. Had what sounds to be 2 deep sutures placed plus subcutaneous sutures  he had some bleeding the next day and return to clinic but bleeding stopped after presenting here. Since then he's had no bleeding and no drainage. No redness. No fevers or chills. Eating and drinking well  ROS as above.    PMH reviewed. Patient is a nonsmoker.   Past Medical History:  Diagnosis Date  . Allergy   . Arthritis   . GERD (gastroesophageal reflux disease)    occ  . Heart murmur    baby  . Hypertension   . Seasonal allergies    Past Surgical History:  Procedure Laterality Date  . CARPAL TUNNEL WITH CUBITAL TUNNEL Left 11/10/2013   Procedure: LEFT CARPAL TUNNEL RELEASE;  Surgeon: Cammie Sickle, MD;  Location: [facility];  Service: Orthopedics;  Laterality: Left;  . COLONOSCOPY    . KNEE ARTHROSCOPY  [id number]   left and right  . NASAL SINUS SURGERY  05/2015  . TOTAL KNEE ARTHROPLASTY Right 06/24/2016   Procedure: TOTAL KNEE ARTHROPLASTY;  Surgeon: Garald Balding, MD;  Location: [facility];  Service: Orthopedics;  Laterality: Right;  . TRIGGER FINGER RELEASE Left 11/10/2013   Procedure: LEFT INDEX A-1 PULLEY RELEASE;  Surgeon: Cammie Sickle, MD;  Location: [facility];  Service: Orthopedics;  Laterality: Left;  . ULNAR NERVE TRANSPOSITION Left 11/10/2013   Procedure: ULNAR NERVE TRANSPOSITION;  Surgeon: Cammie Sickle, MD;  Location: [facility];  Service: Orthopedics;  Laterality: Left;    Medications reviewed.    Physical Exam:  BP 129/85   Pulse (!) 106   Temp 98.2 F (36.8 C) (Oral)  Gen:  Alert, cooperative patient who appears stated age in no acute distress.  Vital signs reviewed. HEENT: EOMI,  MMM Ext:  Right arm with  V-shaped, healing laceration.  Sutures in place.  No drainage/no redness.  All sutures easily removed.  No bleeding.  Wound fully intact, no dehiscence.   Assessment and Plan:  1.  Arm laceration - doing well - wound healing well - sutures removed today - warning precautions for infection provide.d

## 2016-09-26 DIAGNOSIS — J301 Allergic rhinitis due to pollen: Secondary | ICD-10-CM | POA: Diagnosis not present

## 2016-09-26 DIAGNOSIS — J320 Chronic maxillary sinusitis: Secondary | ICD-10-CM | POA: Diagnosis not present

## 2016-09-26 DIAGNOSIS — J302 Other seasonal allergic rhinitis: Secondary | ICD-10-CM | POA: Diagnosis not present

## 2016-09-30 ENCOUNTER — Encounter (INDEPENDENT_AMBULATORY_CARE_PROVIDER_SITE_OTHER): Payer: Self-pay | Admitting: Orthopaedic Surgery

## 2016-09-30 ENCOUNTER — Ambulatory Visit (INDEPENDENT_AMBULATORY_CARE_PROVIDER_SITE_OTHER): Payer: 59 | Admitting: Orthopaedic Surgery

## 2016-09-30 VITALS — BP 140/82 | HR 109 | Resp 14 | Ht 68.0 in | Wt 155.0 lb

## 2016-09-30 DIAGNOSIS — Z96651 Presence of right artificial knee joint: Secondary | ICD-10-CM

## 2016-09-30 NOTE — Progress Notes (Signed)
Office Visit Note   Patient: Philip Winters           Date of Birth: 29-Mar-1961           MRN: [id number] Visit Date: 09/30/2016              Requested by: Antony Contras, MD [address] [place], [place] [address] PCP: Antony Contras, MD   Assessment & Plan: Visit Diagnoses: Just over 3 months status post primary right total knee replacement. Thigh atrophy and weakness without any evidence of complications with knee components. Popping and clicking in right knee is metal and plastic articulation without evidence of a problem.  Plan: Long discussion regarding need to continue physical therapy. Right thigh is an inch and a half smaller in diameter than the left  Follow-Up Instructions: No Follow-up on file.   Orders:  No orders of the defined types were placed in this encounter.  No orders of the defined types were placed in this encounter.     Procedures: No procedures performed   Clinical Data: No additional findings.   Subjective: Chief Complaint  Patient presents with  . Right Knee - Pain  . Knee Pain    Right knee pain x 4 days, TKR 06/24/2016, no injury, sticking when walking, makes noises, difficulty walking at times, Advil helps some.  Mr. Philip Winters has noticed over the last several days more popping and clicking in his right knee without injury or trauma. 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 He denies fever or chills. His biggest concern is the increase "noises" in his right knee  HPI  Review of Systems   Objective: Vital Signs: BP 140/82 (BP Location: Right Arm, Patient Position: Sitting, Cuff Size: Normal)   Pulse (!) 109   Resp 14   Ht 5\' 8"  (1.727 m)   Wt 155 lb (70.3 kg)   BMI 23.57 kg/m   Physical Exam  Ortho Exam right knee exam without effusion. Full extension and over 115 of flexion. No opening with a varus or valgus stress. Negative anterior drawer sign. Incision is healed nicely. No calf pain.  Neurovascular exam intact distally. No pain with patellar motion. Expected to metal plastic "noise" with motion. No abnormality with polyethylene bridging bearing. I measured an inch and a half discrepancy on right compared to left side measured 6 inches above the superior pole of the patella consistent with quad atrophy from chronic disease prior to knee arthroplasty sensory exam intact.  Specialty Comments:  No specialty comments available.  Imaging: No results found.   PMFS History: Patient Active Problem List   Diagnosis Date Noted  . Unilateral primary osteoarthritis, right knee 06/24/2016  . S/P total knee replacement using cement, right 06/24/2016  . HYPERLIPIDEMIA 01/18/2007  . HYPERTENSION 01/18/2007   Past Medical History:  Diagnosis Date  . Allergy   . Arthritis   . GERD (gastroesophageal reflux disease)    occ  . Heart murmur    baby  . Hypertension   . Seasonal allergies     History reviewed. No pertinent family history.  Past Surgical History:  Procedure Laterality Date  . CARPAL TUNNEL WITH CUBITAL TUNNEL Left 11/10/2013   Procedure: LEFT CARPAL TUNNEL RELEASE;  Surgeon: Cammie Sickle, MD;  Location: [facility];  Service: Orthopedics;  Laterality: Left;  . COLONOSCOPY    . KNEE ARTHROSCOPY  [id number]   left and right  . NASAL SINUS  SURGERY  05/2015  . TOTAL KNEE ARTHROPLASTY Right 06/24/2016   Procedure: TOTAL KNEE ARTHROPLASTY;  Surgeon: Garald Balding, MD;  Location: [facility];  Service: Orthopedics;  Laterality: Right;  . TRIGGER FINGER RELEASE Left 11/10/2013   Procedure: LEFT INDEX A-1 PULLEY RELEASE;  Surgeon: Cammie Sickle, MD;  Location: [facility];  Service: Orthopedics;  Laterality: Left;  . ULNAR NERVE TRANSPOSITION Left 11/10/2013   Procedure: ULNAR NERVE TRANSPOSITION;  Surgeon: Cammie Sickle, MD;  Location: [facility];  Service: Orthopedics;  Laterality: Left;   Social History    Occupational History  . Not on file.   Social History Main Topics  . Smoking status: Former Smoker    Packs/day: 1.00    Years: 37.00    Types: Cigarettes    Quit date: 05/04/2016  . Smokeless tobacco: Never Used  . Alcohol use 0.0 oz/week     Comment: occ  . Drug use: No  . Sexual activity: Not on file     Garald Balding, MD   Note - This record has been created using [employer].  Chart creation errors have been sought, but may not always  have been located. Such creation errors do not reflect on  the standard of medical care.

## 2016-10-09 DIAGNOSIS — M25532 Pain in left wrist: Secondary | ICD-10-CM | POA: Insufficient documentation

## 2016-10-20 ENCOUNTER — Encounter (HOSPITAL_COMMUNITY): Payer: Self-pay | Admitting: Orthopaedic Surgery

## 2016-10-20 NOTE — Addendum Note (Signed)
Addendum  created 10/20/16 1106 by Oleta Mouse, MD   Sign clinical note

## 2016-12-01 ENCOUNTER — Encounter (INDEPENDENT_AMBULATORY_CARE_PROVIDER_SITE_OTHER): Payer: Self-pay | Admitting: Orthopaedic Surgery

## 2016-12-01 ENCOUNTER — Ambulatory Visit (INDEPENDENT_AMBULATORY_CARE_PROVIDER_SITE_OTHER): Payer: 59 | Admitting: Orthopaedic Surgery

## 2016-12-01 VITALS — BP 114/79 | HR 79 | Resp 14 | Ht 70.0 in | Wt 165.0 lb

## 2016-12-01 DIAGNOSIS — Z96651 Presence of right artificial knee joint: Secondary | ICD-10-CM

## 2016-12-01 NOTE — Progress Notes (Signed)
Post-Op Visit Note   Patient: Philip Winters           Date of Birth: 1961/04/14           MRN: [id number] Visit Date: 12/01/2016 PCP: Antony Contras, MD   Assessment & Plan:  Chief Complaint:  Chief Complaint  Patient presents with  . Right Knee - Routine Post Op, Pain    Philip Winters is status post 5 months 5 months R knee replacement.He relates he still has pain, popping and clicking.. Incision is healing nicely.   Visit Diagnoses:  1. History of total right knee replacement   Philip Winters is actually doing well 65 months post right total knee replacement. He works full-time in his plumbing business. He is bending stooping and squatting. On occasion Ila some discomfort along the proximal tibia he denies redness fever or chills. He still is old trouble going up and down stairs. There is obvious discrepancy in the size of his right thigh compared to was left. I really think that continued excises will make a difference. There was no effusion of his right knee today no opening with a varus or valgus stress. Range of motion is 0-116 with a goniometer. No calf pain or popliteal discomfort. No swelling distally. No localized areas of tenderness  Plan: Continue strengthening exercises right lower extremity. Return to office in 6 months.  Follow-Up Instructions: Return in about 6 months (around 06/03/2017).   Orders:  No orders of the defined types were placed in this encounter.  No orders of the defined types were placed in this encounter.   Imaging: No results found.  PMFS History: Patient Active Problem List   Diagnosis Date Noted  . Unilateral primary osteoarthritis, right knee 06/24/2016  . S/P total knee replacement using cement, right 06/24/2016  . HYPERLIPIDEMIA 01/18/2007  . HYPERTENSION 01/18/2007   Past Medical History:  Diagnosis Date  . Allergy   . Arthritis   . GERD (gastroesophageal reflux disease)    occ  . Heart murmur    baby  . Hypertension   . Seasonal  allergies     History reviewed. No pertinent family history.  Past Surgical History:  Procedure Laterality Date  . CARPAL TUNNEL WITH CUBITAL TUNNEL Left 11/10/2013   Procedure: LEFT CARPAL TUNNEL RELEASE;  Surgeon: Cammie Sickle, MD;  Location: [facility];  Service: Orthopedics;  Laterality: Left;  . COLONOSCOPY    . KNEE ARTHROSCOPY  [id number]   left and right  . NASAL SINUS SURGERY  05/2015  . TOTAL KNEE ARTHROPLASTY Right 06/24/2016   Procedure: TOTAL KNEE ARTHROPLASTY;  Surgeon: Garald Balding, MD;  Location: [facility];  Service: Orthopedics;  Laterality: Right;  . TRIGGER FINGER RELEASE Left 11/10/2013   Procedure: LEFT INDEX A-1 PULLEY RELEASE;  Surgeon: Cammie Sickle, MD;  Location: [facility];  Service: Orthopedics;  Laterality: Left;  . ULNAR NERVE TRANSPOSITION Left 11/10/2013   Procedure: ULNAR NERVE TRANSPOSITION;  Surgeon: Cammie Sickle, MD;  Location: [facility];  Service: Orthopedics;  Laterality: Left;   Social History   Occupational History  . Not on file.   Social History Main Topics  . Smoking status: Former Smoker    Packs/day: 1.00    Years: 37.00    Types: Cigarettes    Quit date: 05/04/2016  . Smokeless tobacco: Never Used  . Alcohol use 0.0 oz/week     Comment: occ  . Drug use:  No  . Sexual activity: Not on file

## 2016-12-10 ENCOUNTER — Telehealth (INDEPENDENT_AMBULATORY_CARE_PROVIDER_SITE_OTHER): Payer: Self-pay | Admitting: Orthopaedic Surgery

## 2016-12-10 NOTE — Telephone Encounter (Signed)
Patient would like a recommendation for a hand doctor. The one he was seeing, has now retired and he would like to find another one. Please advise.

## 2017-01-01 DIAGNOSIS — I10 Essential (primary) hypertension: Secondary | ICD-10-CM | POA: Diagnosis not present

## 2017-02-05 ENCOUNTER — Ambulatory Visit (INDEPENDENT_AMBULATORY_CARE_PROVIDER_SITE_OTHER): Payer: 59 | Admitting: Podiatry

## 2017-02-05 ENCOUNTER — Encounter: Payer: Self-pay | Admitting: Podiatry

## 2017-02-05 VITALS — BP 120/86 | HR 101

## 2017-02-05 DIAGNOSIS — L600 Ingrowing nail: Secondary | ICD-10-CM | POA: Diagnosis not present

## 2017-02-05 NOTE — Patient Instructions (Signed)

## 2017-02-05 NOTE — Progress Notes (Signed)
   Subjective:    Patient ID: Philip Winters, male    DOB: 03-23-1961, 56 y.o.   MRN: [id number]  HPI  Chief Complaint  Patient presents with  . Nail Problem    Lt hallux       Review of Systems  Musculoskeletal: Positive for arthralgias.  All other systems reviewed and are negative.      Objective:   Physical Exam        Assessment & Plan:

## 2017-02-20 DIAGNOSIS — Z23 Encounter for immunization: Secondary | ICD-10-CM | POA: Diagnosis not present

## 2017-03-27 DIAGNOSIS — E782 Mixed hyperlipidemia: Secondary | ICD-10-CM | POA: Diagnosis not present

## 2017-03-27 DIAGNOSIS — M1711 Unilateral primary osteoarthritis, right knee: Secondary | ICD-10-CM | POA: Diagnosis not present

## 2017-03-27 DIAGNOSIS — I10 Essential (primary) hypertension: Secondary | ICD-10-CM | POA: Diagnosis not present

## 2017-04-17 DIAGNOSIS — R945 Abnormal results of liver function studies: Secondary | ICD-10-CM | POA: Diagnosis not present

## 2017-05-18 ENCOUNTER — Telehealth (INDEPENDENT_AMBULATORY_CARE_PROVIDER_SITE_OTHER): Payer: Self-pay | Admitting: Orthopaedic Surgery

## 2017-05-18 ENCOUNTER — Other Ambulatory Visit (INDEPENDENT_AMBULATORY_CARE_PROVIDER_SITE_OTHER): Payer: Self-pay

## 2017-05-18 MED ORDER — CLINDAMYCIN HCL 300 MG PO CAPS: ORAL_CAPSULE | ORAL | 0 refills | Status: DC

## 2017-05-18 NOTE — Telephone Encounter (Signed)
Sent to pharmacy 

## 2017-05-18 NOTE — Telephone Encounter (Signed)
Patient needs a rx for an antibotic for dental work he is having done next week. Patient uses [facility] on [address]. Please call patient with concerns.

## 2017-05-25 DIAGNOSIS — I10 Essential (primary) hypertension: Secondary | ICD-10-CM | POA: Diagnosis not present

## 2017-06-03 ENCOUNTER — Telehealth: Payer: Self-pay

## 2017-06-03 NOTE — Telephone Encounter (Signed)
SENT REFERRAL TO SCHEDULING 

## 2017-06-10 ENCOUNTER — Other Ambulatory Visit: Payer: Self-pay

## 2017-06-10 ENCOUNTER — Encounter: Payer: Self-pay | Admitting: Neurology

## 2017-06-10 ENCOUNTER — Ambulatory Visit: Payer: 59 | Admitting: Neurology

## 2017-06-10 VITALS — BP 148/91 | HR 110 | Ht 68.0 in | Wt 164.0 lb

## 2017-06-10 DIAGNOSIS — R202 Paresthesia of skin: Secondary | ICD-10-CM | POA: Insufficient documentation

## 2017-06-10 DIAGNOSIS — R0989 Other specified symptoms and signs involving the circulatory and respiratory systems: Secondary | ICD-10-CM | POA: Diagnosis not present

## 2017-06-10 HISTORY — DX: Other specified symptoms and signs involving the circulatory and respiratory systems: R09.89

## 2017-06-10 NOTE — Patient Instructions (Signed)
   We will check MRI of the brain and get a carotid doppler study.

## 2017-06-10 NOTE — Progress Notes (Signed)
Reason for visit: Right shoulder numbness  Referring physician: Dr. [name] is a 57 y.o. male  History of present illness:  Philip Winters is a 57 year old right-handed white male with a history of onset of right shoulder numbness sometime around October 2018.  The patient is not completely sure when the numbness started, he just noticed it one day when he tried to scratch his shoulder and he realized he could not feel the skin.  There has been no pain in the right shoulder or arm, and he has some mild neck pain without radiation down into either arm.  He denies weakness of the arms or legs, he denies balance issues or difficulty controlling the bowels or the bladder.  The numbness has stayed the same over time, there is been no expansion of the area of numbness.  The patient is concerned about the etiology of his symptomatology.  He comes to this office for an evaluation.   Past Medical History:  Diagnosis Date  . Allergy   . Arthritis   . GERD (gastroesophageal reflux disease)    occ  . Heart murmur    baby  . High cholesterol   . Hypertension   . Right carotid bruit 06/10/2017  . Seasonal allergies     Past Surgical History:  Procedure Laterality Date  . CARPAL TUNNEL WITH CUBITAL TUNNEL Left 11/10/2013   Procedure: LEFT CARPAL TUNNEL RELEASE;  Surgeon: Cammie Sickle, MD;  Location: [facility];  Service: Orthopedics;  Laterality: Left;  . COLONOSCOPY    . KNEE ARTHROSCOPY  [id number]   left and right  . NASAL SINUS SURGERY  05/2015  . TOTAL KNEE ARTHROPLASTY Right 06/24/2016   Procedure: TOTAL KNEE ARTHROPLASTY;  Surgeon: Garald Balding, MD;  Location: [facility];  Service: Orthopedics;  Laterality: Right;  . TRIGGER FINGER RELEASE Left 11/10/2013   Procedure: LEFT INDEX A-1 PULLEY RELEASE;  Surgeon: Cammie Sickle, MD;  Location: [facility];  Service: Orthopedics;  Laterality: Left;  . ULNAR NERVE TRANSPOSITION Left 11/10/2013   Procedure: ULNAR NERVE TRANSPOSITION;  Surgeon: Cammie Sickle, MD;  Location: [facility];  Service: Orthopedics;  Laterality: Left;    History reviewed. No pertinent family history.  Social history:  reports that he quit smoking about 13 months ago. His smoking use included cigarettes. He has a 37.00 pack-year smoking history. he has never used smokeless tobacco. He reports that he drinks alcohol. He reports that he does not use drugs.  Medications:  Prior to Admission medications   Medication Sig Start Date End Date Taking? Authorizing Provider  aspirin EC 81 MG tablet Take 81 mg by mouth daily.   Yes [provider]  Multiple Vitamin (MULTIVITAMIN WITH MINERALS) TABS tablet Take 1 tablet by mouth daily.   Yes [provider]  naproxen sodium (ANAPROX) 220 MG tablet Take 220 mg by mouth 2 (two) times daily with a meal.   Yes [provider]  olmesartan (BENICAR) 40 MG tablet TK 1 T PO QD 01/30/17  Yes [provider]  clindamycin (CLEOCIN) 300 MG capsule Take 1 tablet  1 hour prior to any dental procedure 05/18/17   Garald Balding, MD  co-enzyme Q-10 30 MG capsule Take 30 mg by mouth daily.    [provider]  pravastatin (PRAVACHOL) 10 MG tablet TK 1 T PO QD 09/24/16   [provider]  Tetrahydrozoline HCl ([place] [facility])  Apply 1 drop to eye daily.    [provider]      Allergies  Allergen Reactions  . Mushroom Extract Complex Anaphylaxis  . Penicillins Shortness Of Breath    Childhood allergy.  Has patient had a PCN reaction causing immediate rash, facial/tongue/throat swelling, SOB or lightheadedness with hypotension: No Has patient had a PCN reaction causing severe rash involving mucus membranes or skin necrosis: No Has patient had a PCN reaction that required hospitalization No Has patient had a PCN reaction occurring within the last 10 years: No If all of the above answers are "NO", then may proceed  with C  . Codeine Nausea And Vomiting  . Iodine Rash    ROS:  Out of a complete 14 system review of symptoms, the patient complains only of the following symptoms, and all other reviewed systems are negative.  Numbness  Blood pressure (!) 148/91, pulse (!) 110, height 5\' 8"  (1.727 m), weight 164 lb (74.4 kg).  Physical Exam  General: The patient is alert and cooperative at the time of the examination.  Eyes: Pupils are equal, round, and reactive to light. Discs are flat bilaterally.  Neck: The neck is supple, a right carotid bruit was noted.  Respiratory: The respiratory examination is clear.  Cardiovascular: The cardiovascular examination reveals a regular rate and rhythm, no obvious murmurs or rubs are noted.  Neuromuscular: Range of movement the cervical spine lacks about 10 degrees of full lateral rotation bilaterally.  Skin: Extremities are without significant edema.  Neurologic Exam  Mental status: The patient is alert and oriented x 3 at the time of the examination. The patient has apparent normal recent and remote memory, with an apparently normal attention span and concentration ability.  Cranial nerves: Facial symmetry is present. There is good sensation of the face to pinprick and soft touch bilaterally. The strength of the facial muscles and the muscles to head turning and shoulder shrug are normal bilaterally. Speech is well enunciated, no aphasia or dysarthria is noted. Extraocular movements are full. Visual fields are full. The tongue is midline, and the patient has symmetric elevation of the soft palate. No obvious hearing deficits are noted.  Motor: The motor testing reveals 5 over 5 strength of all 4 extremities. Good symmetric motor tone is noted throughout.  Sensory: Sensory testing is intact to pinprick, soft touch, vibration sensation, and position sense on all 4 extremities, with exception of an area of numbness in the upper right shoulder anteriorly and in  the deltoid region in the upper arm on the right. No evidence of extinction is noted.  Coordination: Cerebellar testing reveals good finger-nose-finger and heel-to-shin bilaterally.  Gait and station: Gait is normal. Tandem gait is slightly unsteady. Romberg is negative. No drift is seen.  Reflexes: Deep tendon reflexes are symmetric and normal bilaterally. Toes are downgoing bilaterally.   Assessment/Plan:  1.  Right shoulder numbness  2.  Right carotid bruit  The area of numbness of the right shoulder may approximate a C5 nerve root dermatome, but the patient reports no pain whatsoever, clinical examination shows no reflex asymmetry or weakness in the right arm.  The onset therefore was completely painless in nature.  The patient does have a history of hypertension, he appears to have a right carotid bruit.  A small left brain cerebrovascular event does need to be considered, demyelinating disease is also a consideration.  MRI of the brain will be done, a carotid Doppler study will be performed.  I  will contact the patient concerning the results.  Jill Alexanders MD 06/10/2017 2:11 PM  [facility] [address] [place], [place] [address]  Phone [phone] Fax [phone]

## 2017-06-11 ENCOUNTER — Other Ambulatory Visit: Payer: Self-pay | Admitting: *Deleted

## 2017-06-11 DIAGNOSIS — R0989 Other specified symptoms and signs involving the circulatory and respiratory systems: Secondary | ICD-10-CM

## 2017-06-16 ENCOUNTER — Ambulatory Visit (HOSPITAL_COMMUNITY)
Admission: RE | Admit: 2017-06-16 | Discharge: 2017-06-16 | Disposition: A | Discharge status: Home / Self Care | Payer: 59 | Source: Ambulatory Visit | Attending: Neurology | Admitting: Neurology

## 2017-06-16 DIAGNOSIS — R0989 Other specified symptoms and signs involving the circulatory and respiratory systems: Secondary | ICD-10-CM | POA: Insufficient documentation

## 2017-06-16 DIAGNOSIS — I6523 Occlusion and stenosis of bilateral carotid arteries: Secondary | ICD-10-CM | POA: Diagnosis not present

## 2017-06-16 NOTE — Progress Notes (Signed)
Carotid duplex prelim: 1-39% ICA stenosis.  Hersel Mcmeen Eunice, RDMS, RVT   

## 2017-06-17 ENCOUNTER — Other Ambulatory Visit: Payer: 59

## 2017-06-17 ENCOUNTER — Telehealth: Payer: Self-pay | Admitting: Neurology

## 2017-06-17 ENCOUNTER — Ambulatory Visit: Payer: 59

## 2017-06-17 MED ORDER — ALPRAZOLAM 0.5 MG PO TABS: ORAL_TABLET | ORAL | 0 refills | Status: DC

## 2017-06-17 NOTE — Telephone Encounter (Signed)
Patient attempted to have the MRI today on the mobile unit but he was unable to have it because he was claustrophobic . He is reschedule to have the MRI next Tuesday 06/23/17 he stated he would like to take something to help calm his nerves.

## 2017-06-17 NOTE — Telephone Encounter (Signed)
I called the patient.  I will send in a prescription for alprazolam for the MRI next week.

## 2017-06-17 NOTE — Telephone Encounter (Signed)
I called the patient.  The carotid Doppler study is unremarkable.  MRI of the brain is pending.  Carotid doppler 06/17/17:  Final Interpretation: Right Carotid: Velocities in the right ICA are consistent with a 1-39% stenosis.  Left Carotid: Velocities in the left ICA are consistent with a 1-39% stenosis.  Vertebrals: Both vertebral arteries were patent with antegrade flow.

## 2017-06-18 ENCOUNTER — Encounter (HOSPITAL_COMMUNITY): Payer: 59

## 2017-06-18 NOTE — Telephone Encounter (Signed)
Fax confirmation received [facility] [phone]. Xanax.

## 2017-06-23 ENCOUNTER — Ambulatory Visit: Payer: 59

## 2017-06-23 DIAGNOSIS — R202 Paresthesia of skin: Secondary | ICD-10-CM

## 2017-06-24 ENCOUNTER — Telehealth: Payer: Self-pay | Admitting: Neurology

## 2017-06-24 NOTE — Telephone Encounter (Signed)
  I called the patient.  MRI of the brain is unremarkable, we will check carotid Doppler study.   MRI brain 06/23/17:  IMPRESSION:   Unremarkable MRI brain (without). Minimal periventricular and subcortical foci of non-specific gliosis.   No acute findings.

## 2017-07-03 ENCOUNTER — Encounter: Payer: Self-pay | Admitting: *Deleted

## 2017-07-13 ENCOUNTER — Ambulatory Visit: Payer: 59 | Admitting: Cardiology

## 2017-07-13 ENCOUNTER — Encounter: Payer: Self-pay | Admitting: Cardiology

## 2017-07-13 VITALS — BP 130/80 | HR 117 | Ht 68.0 in | Wt 164.4 lb

## 2017-07-13 DIAGNOSIS — R000 Tachycardia, unspecified: Secondary | ICD-10-CM | POA: Diagnosis not present

## 2017-07-13 DIAGNOSIS — I700 Atherosclerosis of aorta: Secondary | ICD-10-CM | POA: Diagnosis not present

## 2017-07-13 DIAGNOSIS — R9431 Abnormal electrocardiogram [ECG] [EKG]: Secondary | ICD-10-CM | POA: Diagnosis not present

## 2017-07-13 NOTE — Progress Notes (Signed)
Cardiology Office Note:    Date:  07/13/2017   ID:  FREEDOM PEDDY, DOB [birth year], MRN [id number]  PCP:  Antony Contras, MD  Cardiologist:  No primary care provider on file.   Referring MD: Antony Contras, MD     History of Present Illness:    Philip Winters is a 57 y.o. male here for evaluation of hypertension at the request of Dr. Moreen Fowler.  He is tried several different medications as above.  According to phone note unfortunately he did not appear to be tolerating any of the beta-blocker classes.  He was discontinued on metoprolol.  This is when he was asked to consult with [name].  He had been having some ongoing tachycardia as well.  At this time he was continuing with generic Benicar.   EKG on 06/17/16 shows sinus tachycardia rate 104 with no other abnormalities.  HR has been over 105 his whole life.   When on Bb HR went to 80, felt like could not function.  Denies CP, SOB, N/V/F/C/orthopnea.   Father had PVD, carotid dz.  Tob - Quit 04/2016 - wellbutrin.  40 year hx.   Prior Knee 06/2016  Under allergies patient has red dye causing rash, Crestor causing muscle aches and headaches, Bystolic causing lightheadedness, Chantix causing headaches, Lipitor intolerance, losartan causing tachycardia/ineffective, Lovaza causing myalgias, metoprolol succinate causing lethargy and depression, oxycodone causing hallucinations, pravastatin causing leg cramping, penicillin causing anaphylaxis, codeine causing stomach upset.  Creatinine 1.74, sodium 137, ALT 59-24, LDL 161, triglycerides 111, HDL 53, TSH normal at 1.12.  Right Carotid: Velocities in the right ICA are consistent with a 1-39% stenosis.  Left Carotid: Velocities in the left ICA are consistent with a 1-39% stenosis.  Vertebrals: Both vertebral arteries were patent with antegrade flow.  Past Medical History:  Diagnosis Date  . Allergy   . Arthritis   . GERD (gastroesophageal reflux disease)    occ  . Heart murmur    baby  . High  cholesterol   . Hypertension   . Right carotid bruit 06/10/2017  . Seasonal allergies     Past Surgical History:  Procedure Laterality Date  . CARPAL TUNNEL WITH CUBITAL TUNNEL Left 11/10/2013   Procedure: LEFT CARPAL TUNNEL RELEASE;  Surgeon: Cammie Sickle, MD;  Location: [facility];  Service: Orthopedics;  Laterality: Left;  . COLONOSCOPY    . KNEE ARTHROSCOPY  [id number]   left and right  . NASAL SINUS SURGERY  05/2015  . TOTAL KNEE ARTHROPLASTY Right 06/24/2016   Procedure: TOTAL KNEE ARTHROPLASTY;  Surgeon: Garald Balding, MD;  Location: [facility];  Service: Orthopedics;  Laterality: Right;  . TRIGGER FINGER RELEASE Left 11/10/2013   Procedure: LEFT INDEX A-1 PULLEY RELEASE;  Surgeon: Cammie Sickle, MD;  Location: [facility];  Service: Orthopedics;  Laterality: Left;  . ULNAR NERVE TRANSPOSITION Left 11/10/2013   Procedure: ULNAR NERVE TRANSPOSITION;  Surgeon: Cammie Sickle, MD;  Location: [facility];  Service: Orthopedics;  Laterality: Left;    Current Medications: Current Meds  Medication Sig  . aspirin EC 81 MG tablet Take 81 mg by mouth daily.  . clindamycin (CLEOCIN) 300 MG capsule Take 1 tablet  1 hour prior to any dental procedure  . Multiple Vitamin (MULTIVITAMIN WITH MINERALS) TABS tablet Take 1 tablet by mouth daily.  . naproxen sodium (ANAPROX) 220 MG tablet Take 220 mg by mouth 2 (two) times daily with a meal.  .  olmesartan (BENICAR) 40 MG tablet TK 1 T PO QD     Allergies:   Mushroom extract complex; Penicillins; Bystolic [nebivolol hcl]; Metoprolol; Codeine; and Iodine   Social History   Socioeconomic History  . Marital status: Married    Spouse name: [name]  . Number of children: 0  . Years of education: 30  . Highest education level: None  Social Needs  . Financial resource strain: None  . Food insecurity - worry: None  . Food insecurity - inability: None  . Transportation needs - medical: None    . Transportation needs - non-medical: None  Occupational History  . Occupation: Self employed  Tobacco Use  . Smoking status: Former Smoker    Packs/day: 1.00    Years: 37.00    Pack years: 37.00    Types: Cigarettes    Last attempt to quit: 05/04/2016    Years since quitting: 1.1  . Smokeless tobacco: Never Used  Substance and Sexual Activity  . Alcohol use: Yes    Alcohol/week: 0.0 oz    Comment: occ  . Drug use: No  . Sexual activity: None  Other Topics Concern  . None  Social History Narrative   Lives with wife, [name]   Caffeine use: Coffee daily   Right handed      Family History: As above  ROS:   Please see the history of present illness.     All other systems reviewed and are negative.  EKGs/Labs/Other Studies Reviewed:    The following studies were reviewed today: Prior notes reviewed, ECG reviewed, labs reviewed  EKG:  As above in HPI.   Recent Labs: No results found for requested labs within last 8760 hours.  Recent Lipid Panel No results found for: CHOL, TRIG, HDL, CHOLHDL, VLDL, LDLCALC, LDLDIRECT  Physical Exam:    VS:  BP 130/80   Pulse (!) 117   Ht 5\' 8"  (1.727 m)   Wt 164 lb 6.4 oz (74.6 kg)   SpO2 95%   BMI 25.00 kg/m     Wt Readings from Last 3 Encounters:  07/13/17 164 lb 6.4 oz (74.6 kg)  06/10/17 164 lb (74.4 kg)  12/01/16 165 lb (74.8 kg)     GEN:  Well nourished, well developed in no acute distress HEENT: Normal NECK: No JVD; No carotid bruits LYMPHATICS: No lymphadenopathy CARDIAC: tachy RR, 1/6 SEM, no rubs, gallops RESPIRATORY:  Clear to auscultation without rales, wheezing or rhonchi  ABDOMEN: Soft, non-tender, non-distended MUSCULOSKELETAL:  No edema; No deformity  SKIN: Warm and dry NEUROLOGIC:  Alert and oriented x 3 PSYCHIATRIC:  Normal affect   ASSESSMENT:    1. Sinus tachycardia   2. Abnormal EKG   3. Aortic atherosclerosis (HCC)    PLAN:    In order of problems listed above:  Sinus  tachycardia -He states that his entire life he said a heart rate greater than 100, he was 105 for most of his life.  He was accepted into the [employer] with this he states.  Of course, extremely fast heart rates such as heart rates consistently over 130 bpm can cause a tachycardia mediated cardiomyopathy.  He has not demonstrated any shortness of breath, no chest pain.  No orthopnea.  I will check an echocardiogram to ensure proper structure and function of his heart.  His thyroid function is normal.  If his ejection fraction is normal and he is not showing any wear and tear, I do not think that it is unreasonable for  him to continue at his current pace.  Beta-blockers have been tried but he felt extremely lethargic.  One could trial diltiazem but once again if his ejection fraction is normal and do not think this is necessary.  If he does need further blood pressure control, one could consider the diltiazem, Cardizem 120 mg CD once a day for instance starting. I also suggested to him to occasionally take a minute or 2 out of his day to perform some deep breathing.    Essential hypertension -At today's visit 130/80 on Benicar 40 generic.  Once again, if his blood pressure does elevate, consider addition of Cardizem.  Aortic atherosclerosis - We discussed this as well as his coronary artery plaque bilaterally 1-39%.  I suggested that he once again trial statin therapy.  He stated that he did have trouble with these medications in the past.  One possibility is to consider using Crestor starting at 5 mg once a week and then increasing it perhaps to twice or 3 times a week to see if he is able to tolerate this medication.  This would be for plaque stabilization and reduction of strokes and heart attacks.  He smoked for over 40 years.  There is a good chance he also has coronary artery calcification as well.  Strongly recommend trialing statin therapy once again.  We discussed.  We will follow-up with studies.   Let me know if I can be of further assistance.  Medication Adjustments/Labs and Tests Ordered: Current medicines are reviewed at length with the patient today.  Concerns regarding medicines are outlined above.  Orders Placed This Encounter  Procedures  . ECHOCARDIOGRAM COMPLETE   No orders of the defined types were placed in this encounter.   Signed, Candee Furbish, MD  07/13/2017 2:05 PM    [facility]

## 2017-07-13 NOTE — Patient Instructions (Signed)
Medication Instructions:  The current medical regimen is effective;  continue present plan and medications.  Testing/Procedures: Your physician has requested that you have an echocardiogram. Echocardiography is a painless test that uses sound waves to create images of your heart. It provides your doctor with information about the size and shape of your heart and how well your heart's chambers and valves are working. This procedure takes approximately one hour. There are no restrictions for this procedure.  Follow-Up: Follow up as needed with Dr Marlou Porch.  Thank you for choosing [facility]!!

## 2017-07-23 ENCOUNTER — Ambulatory Visit (HOSPITAL_COMMUNITY): Payer: 59 | Attending: Cardiology

## 2017-07-23 ENCOUNTER — Other Ambulatory Visit: Payer: Self-pay

## 2017-07-23 DIAGNOSIS — Z87891 Personal history of nicotine dependence: Secondary | ICD-10-CM | POA: Diagnosis not present

## 2017-07-23 DIAGNOSIS — I083 Combined rheumatic disorders of mitral, aortic and tricuspid valves: Secondary | ICD-10-CM | POA: Insufficient documentation

## 2017-07-23 DIAGNOSIS — R9431 Abnormal electrocardiogram [ECG] [EKG]: Secondary | ICD-10-CM | POA: Insufficient documentation

## 2017-07-30 ENCOUNTER — Ambulatory Visit: Payer: 59 | Admitting: Podiatry

## 2017-07-30 ENCOUNTER — Encounter: Payer: Self-pay | Admitting: Podiatry

## 2017-07-30 DIAGNOSIS — L600 Ingrowing nail: Secondary | ICD-10-CM

## 2017-07-30 NOTE — Progress Notes (Signed)
Subjective:   Patient ID: Philip Winters, male   DOB: 57 y.o.   MRN: [id number]   HPI Patient presents stating that his big toenail left has been loose and is concerned that he may have traumatized it or that it could be damaged   ROS      Objective:  Physical Exam  Neurovascular status intact with incurvated left hallux nail with previous ingrown that is done well with some discoloration of the remaining nail with possibility for trauma     Assessment:  Traumatized left hallux nail with possibility that fungal infiltration has occurred     Plan:  I did a sterile debridement of the loose and tight nail and will allow it to grow out and explained some day in the future he may lose this nail permanently and I educated him on this procedure

## 2017-07-30 NOTE — Patient Instructions (Signed)

## 2017-08-31 ENCOUNTER — Other Ambulatory Visit: Payer: Self-pay

## 2017-08-31 ENCOUNTER — Emergency Department (HOSPITAL_COMMUNITY): Payer: 59

## 2017-08-31 ENCOUNTER — Encounter (HOSPITAL_COMMUNITY): Payer: Self-pay

## 2017-08-31 ENCOUNTER — Emergency Department (HOSPITAL_COMMUNITY)
Admission: EM | Admit: 2017-08-31 | Discharge: 2017-08-31 | Disposition: A | Discharge status: Home / Self Care | Payer: 59 | Attending: Emergency Medicine | Admitting: Emergency Medicine

## 2017-08-31 DIAGNOSIS — Z8679 Personal history of other diseases of the circulatory system: Secondary | ICD-10-CM | POA: Insufficient documentation

## 2017-08-31 DIAGNOSIS — W010XXA Fall on same level from slipping, tripping and stumbling without subsequent striking against object, initial encounter: Secondary | ICD-10-CM | POA: Insufficient documentation

## 2017-08-31 DIAGNOSIS — M5442 Lumbago with sciatica, left side: Secondary | ICD-10-CM

## 2017-08-31 DIAGNOSIS — Z96651 Presence of right artificial knee joint: Secondary | ICD-10-CM | POA: Insufficient documentation

## 2017-08-31 DIAGNOSIS — R2242 Localized swelling, mass and lump, left lower limb: Secondary | ICD-10-CM | POA: Diagnosis not present

## 2017-08-31 DIAGNOSIS — M542 Cervicalgia: Secondary | ICD-10-CM | POA: Insufficient documentation

## 2017-08-31 DIAGNOSIS — R200 Anesthesia of skin: Secondary | ICD-10-CM | POA: Diagnosis not present

## 2017-08-31 DIAGNOSIS — I10 Essential (primary) hypertension: Secondary | ICD-10-CM | POA: Diagnosis not present

## 2017-08-31 DIAGNOSIS — Z79899 Other long term (current) drug therapy: Secondary | ICD-10-CM | POA: Insufficient documentation

## 2017-08-31 DIAGNOSIS — Z87891 Personal history of nicotine dependence: Secondary | ICD-10-CM | POA: Insufficient documentation

## 2017-08-31 DIAGNOSIS — G8929 Other chronic pain: Secondary | ICD-10-CM | POA: Insufficient documentation

## 2017-08-31 DIAGNOSIS — Z7982 Long term (current) use of aspirin: Secondary | ICD-10-CM | POA: Insufficient documentation

## 2017-08-31 DIAGNOSIS — M545 Low back pain: Secondary | ICD-10-CM | POA: Diagnosis not present

## 2017-08-31 DIAGNOSIS — M1712 Unilateral primary osteoarthritis, left knee: Secondary | ICD-10-CM | POA: Diagnosis not present

## 2017-08-31 HISTORY — DX: Sciatica, unspecified side: M54.30

## 2017-08-31 HISTORY — DX: Spinal stenosis, site unspecified: M48.00

## 2017-08-31 IMAGING — CR DG KNEE COMPLETE 4+V*L*
4 series · 4 of 4 positions shown · non-contrast
Comparison: None.

CLINICAL DATA: Generalized left knee pain after recent fall.

EXAM:
LEFT KNEE - COMPLETE 4+ VIEW

[t knee ap left]
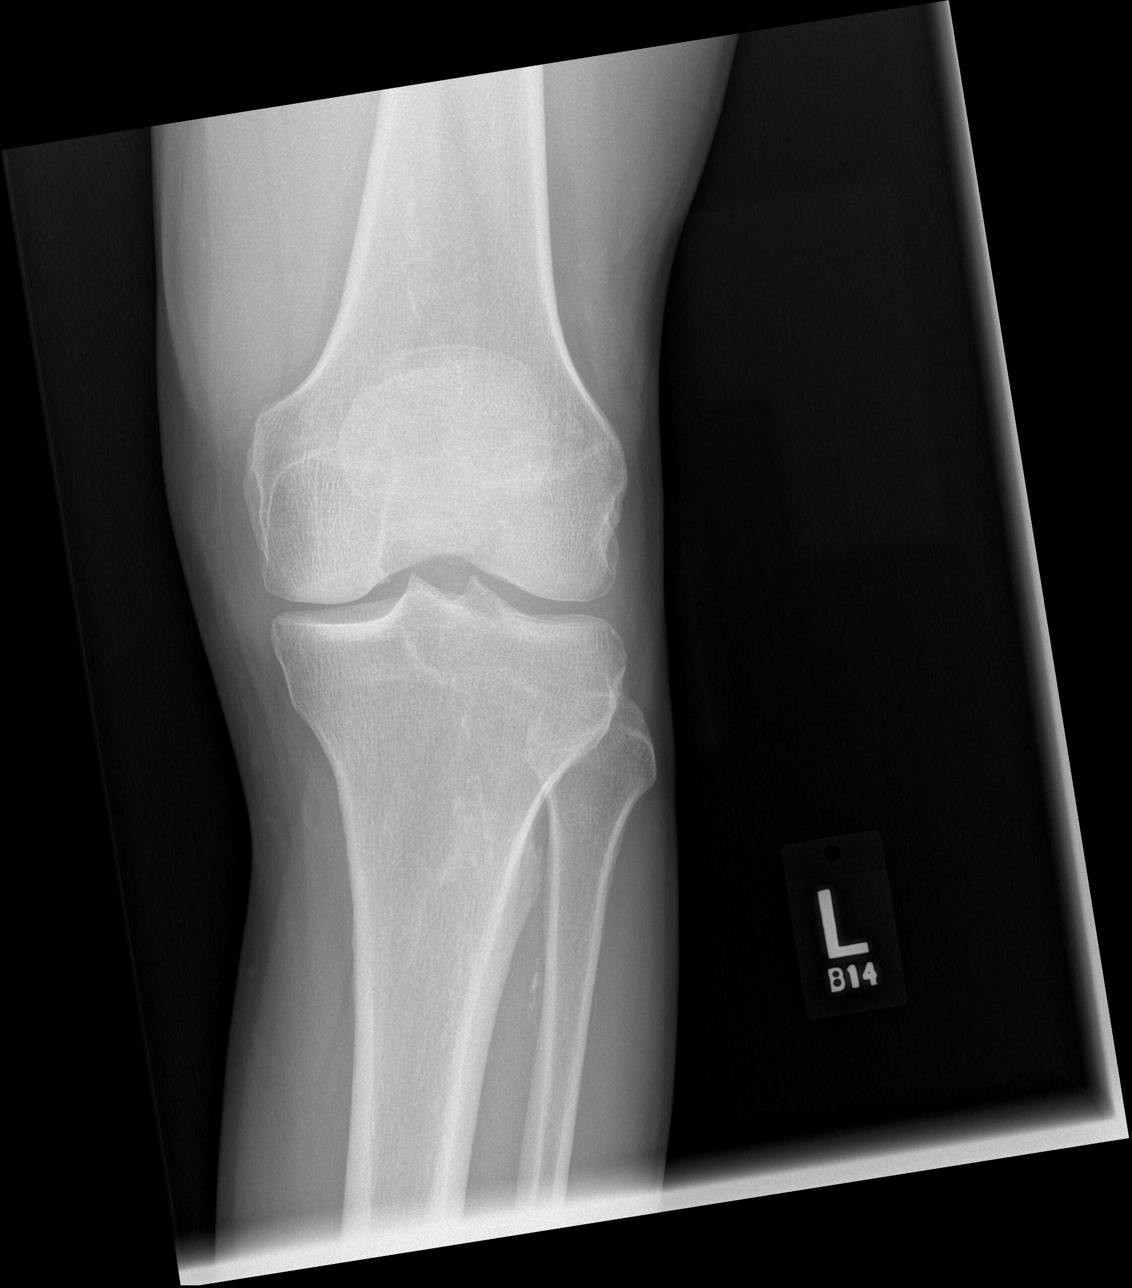

[t knee obl left (1 of 2)]
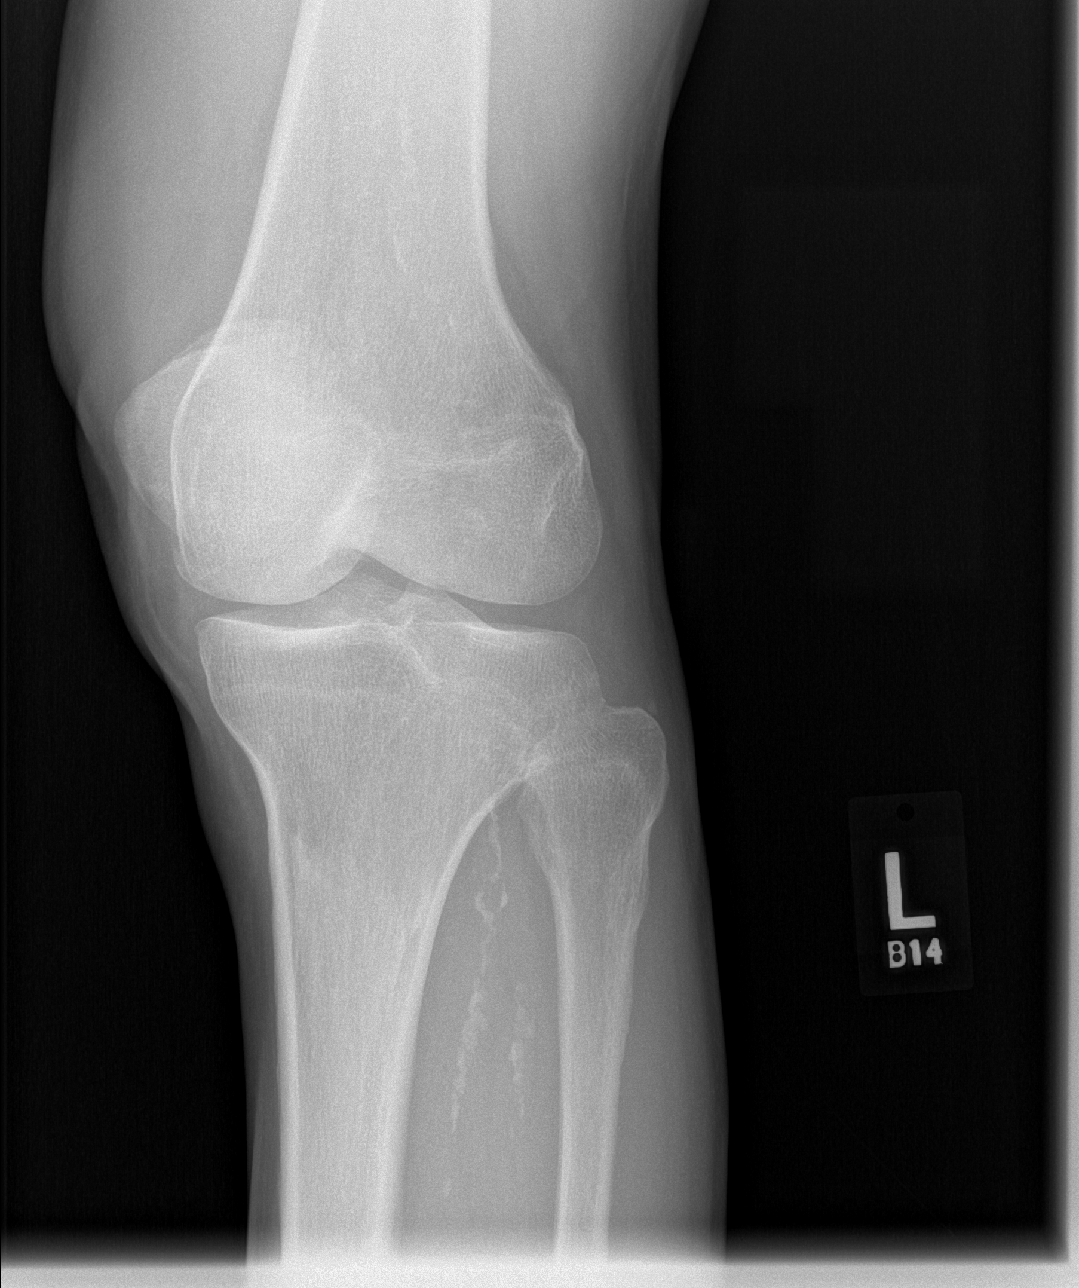

[t knee obl left (2 of 2)]
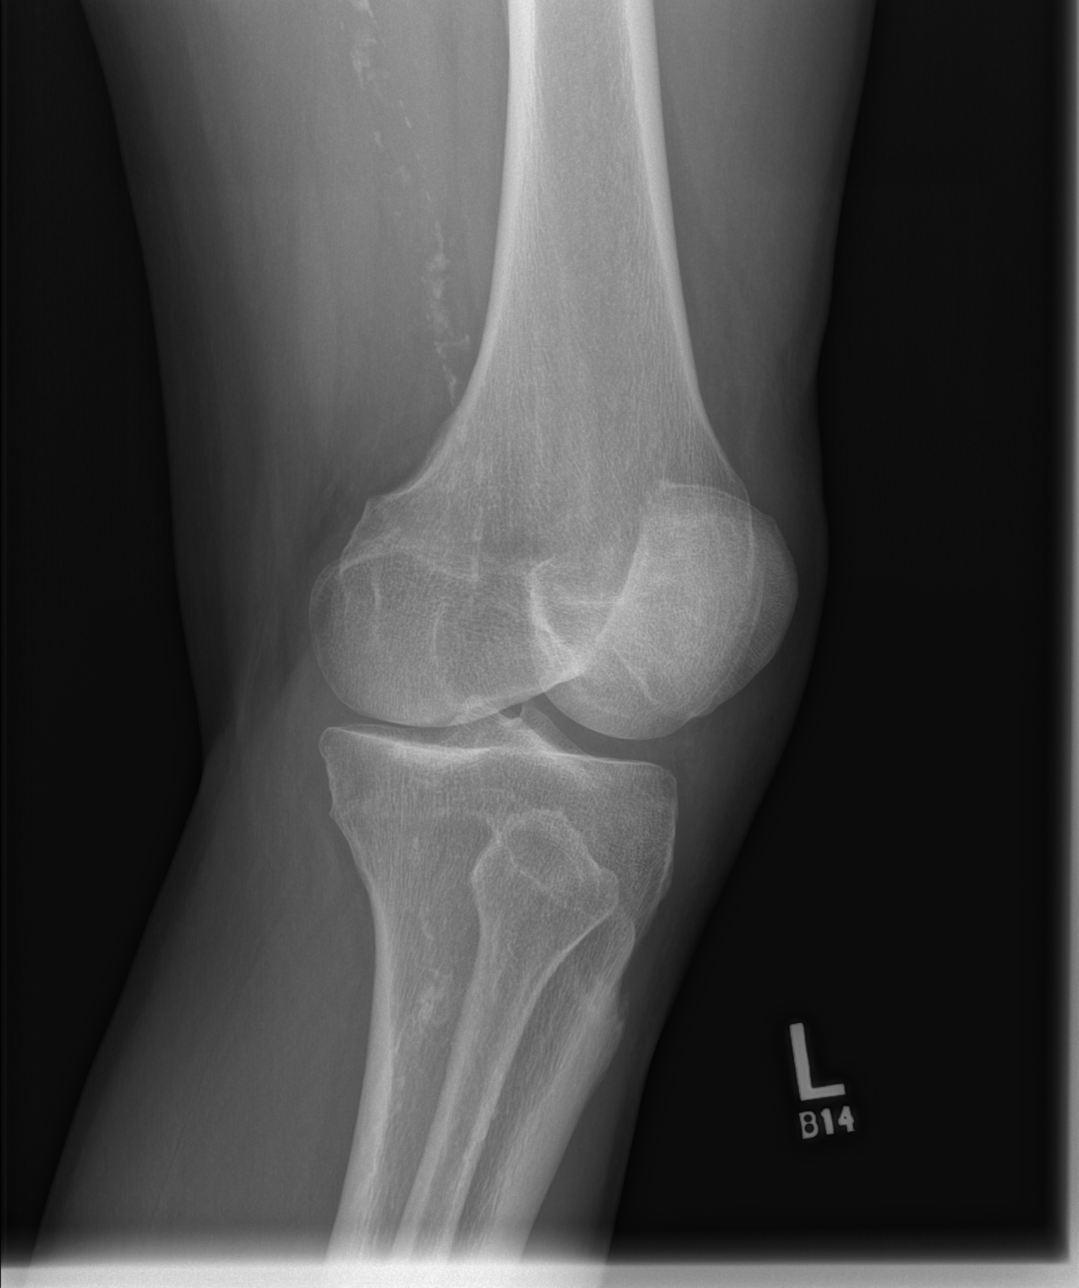

[t knee lat left]
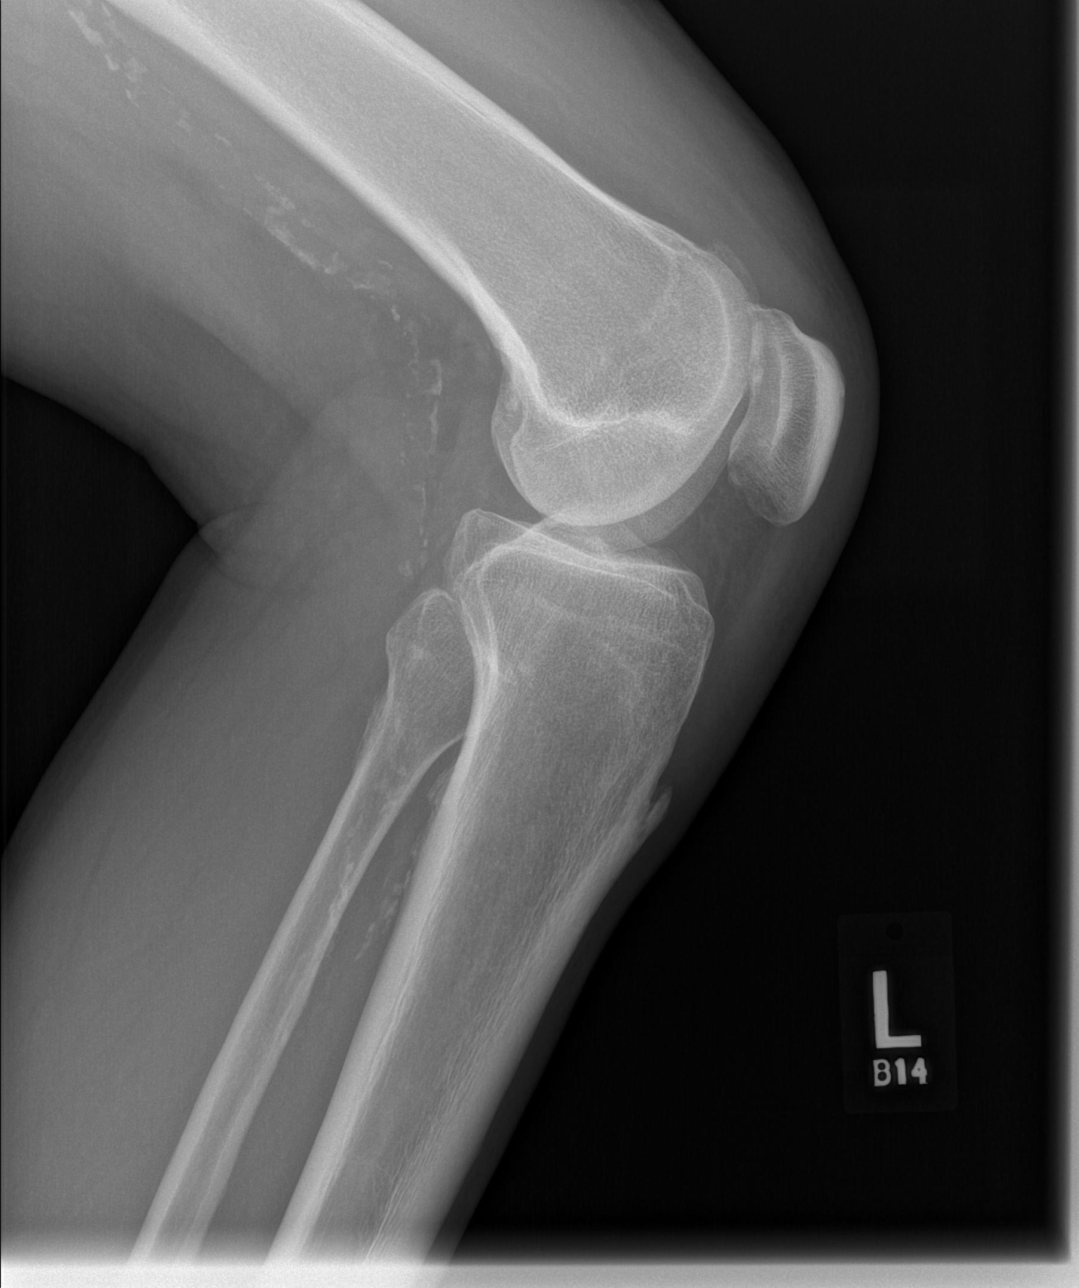

[4 of 4 positions shown; findings below may reference images not displayed]

FINDINGS: No fracture or dislocation. No significant joint effusion. No
suspicious focal osseous lesions. Mild patellofemoral compartment
osteoarthritis. Small left tibial tubercle enthesophyte. Vascular
calcifications in the posterior soft tissues. No radiopaque foreign
bodies.
IMPRESSION: 1. No fracture, dislocation or significant joint effusion in the
left knee.
2. Mild patellofemoral compartment osteoarthritis.

## 2017-08-31 IMAGING — CR DG LUMBAR SPINE COMPLETE 4+V
5 series · 5 of 5 positions shown · non-contrast
Comparison: None.

CLINICAL DATA: 56-year-old male with left lower back pain extending
down left leg. Post fall 6 days ago. Initial encounter.

EXAM:
LUMBAR SPINE - COMPLETE 4+ VIEW

[t lumbar spine lat]
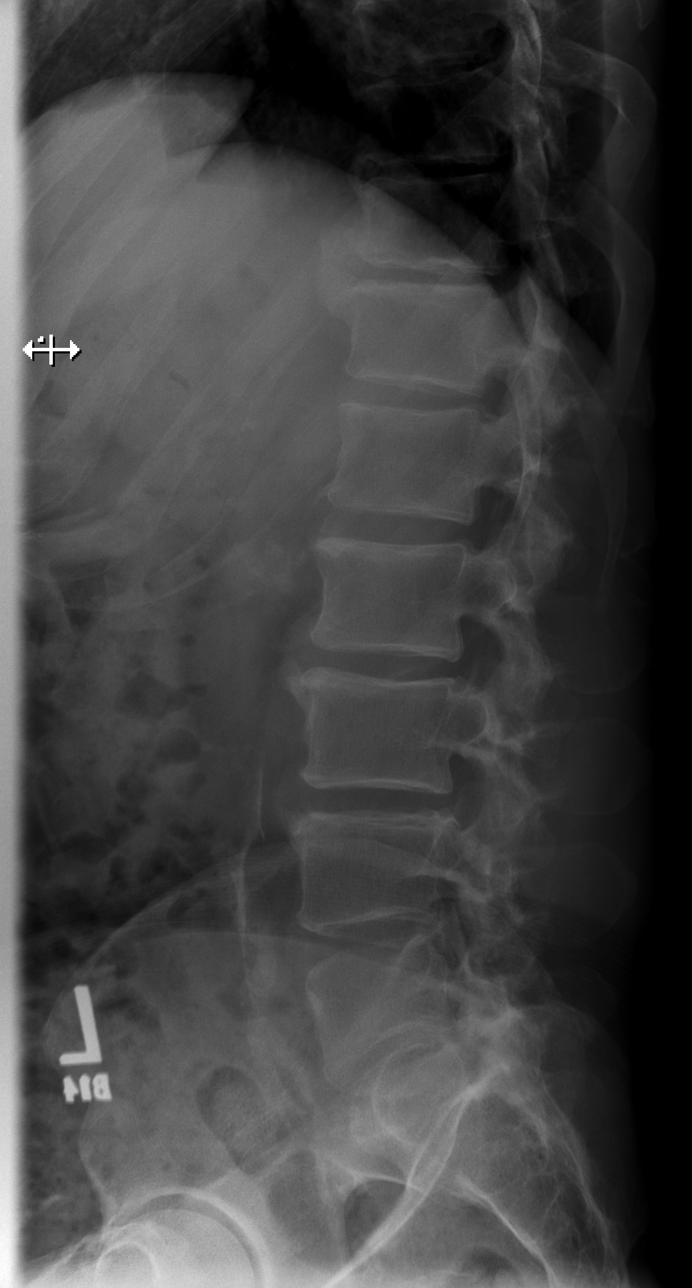

[t lumbar l-5 s-1 spot]
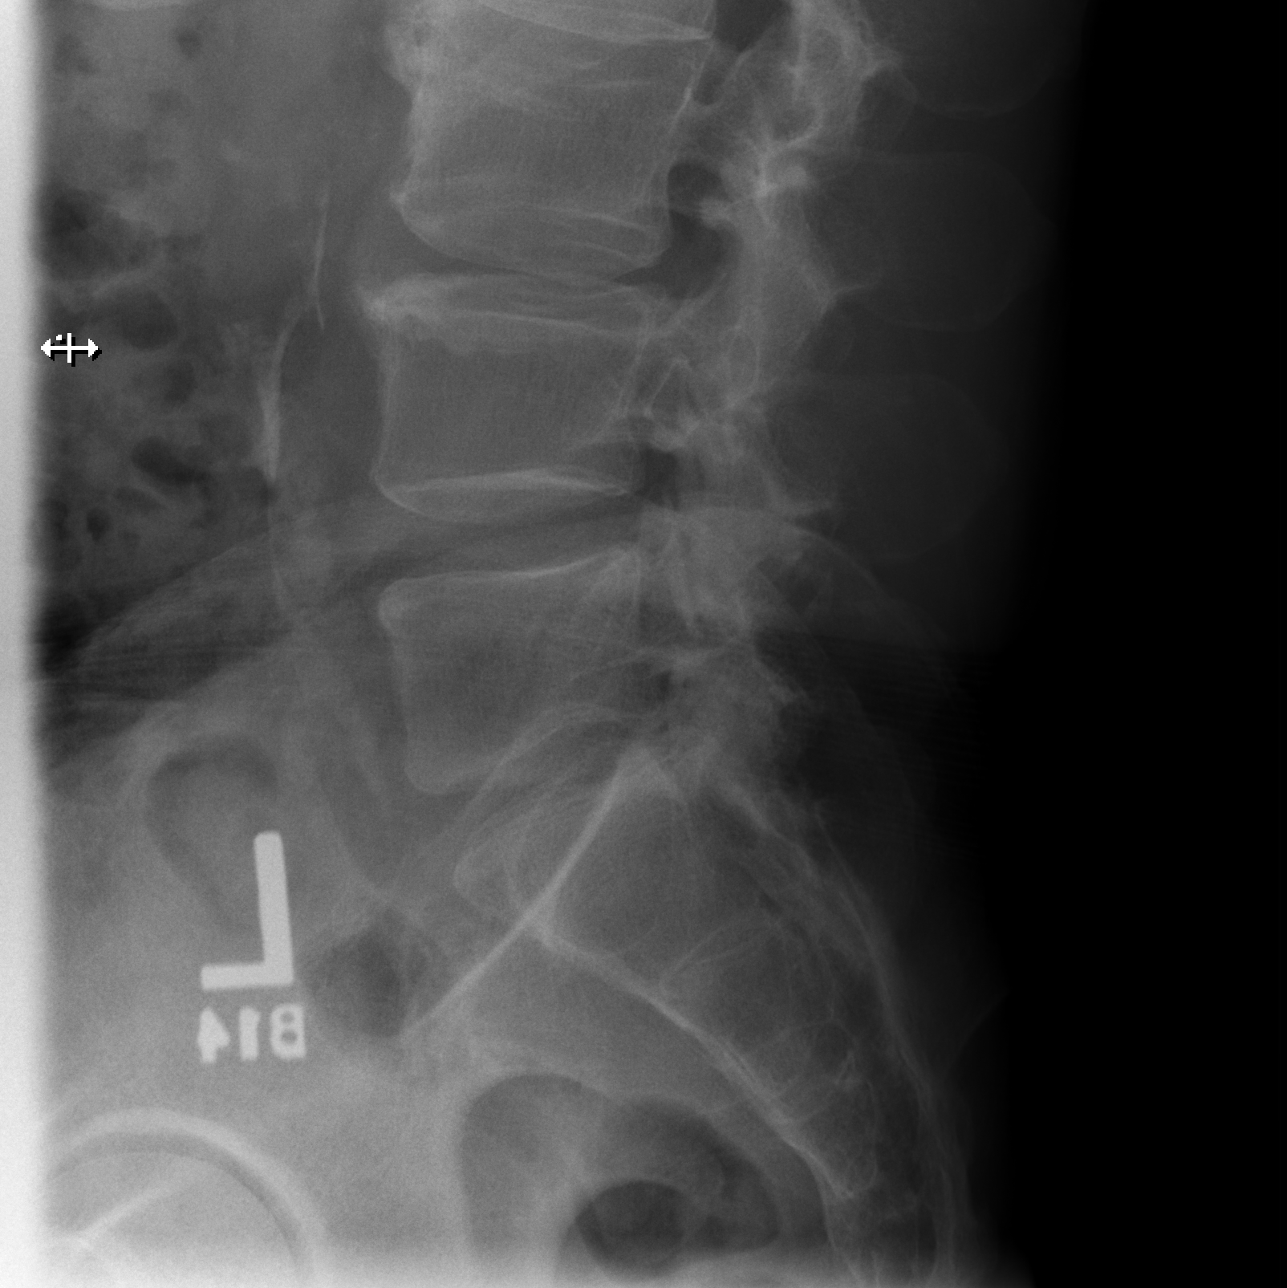

[t lumbar spine obl (1 of 2)]
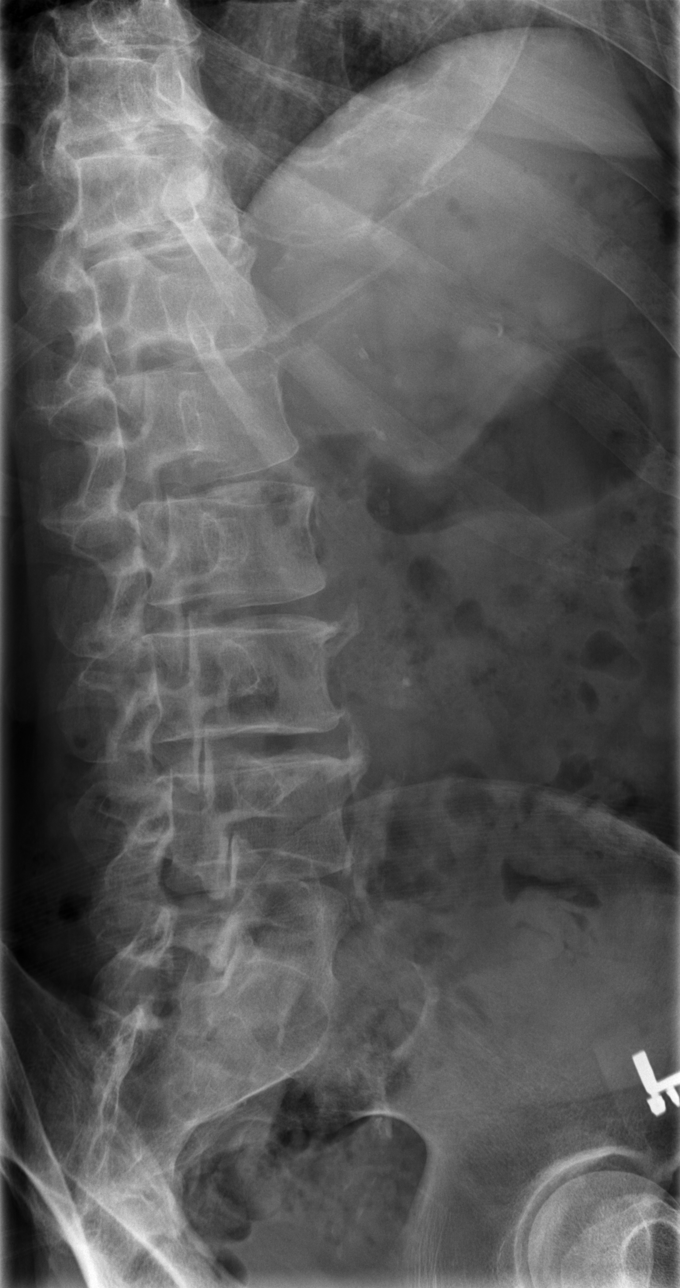

[t lumbar spine ap]
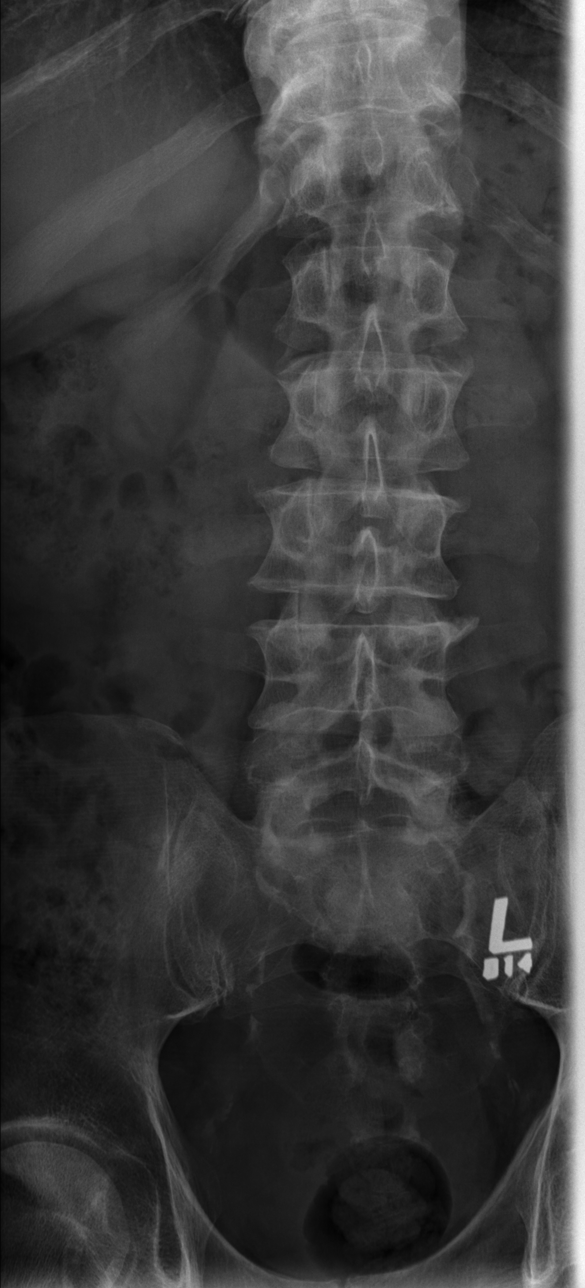

[t lumbar spine obl (2 of 2)]
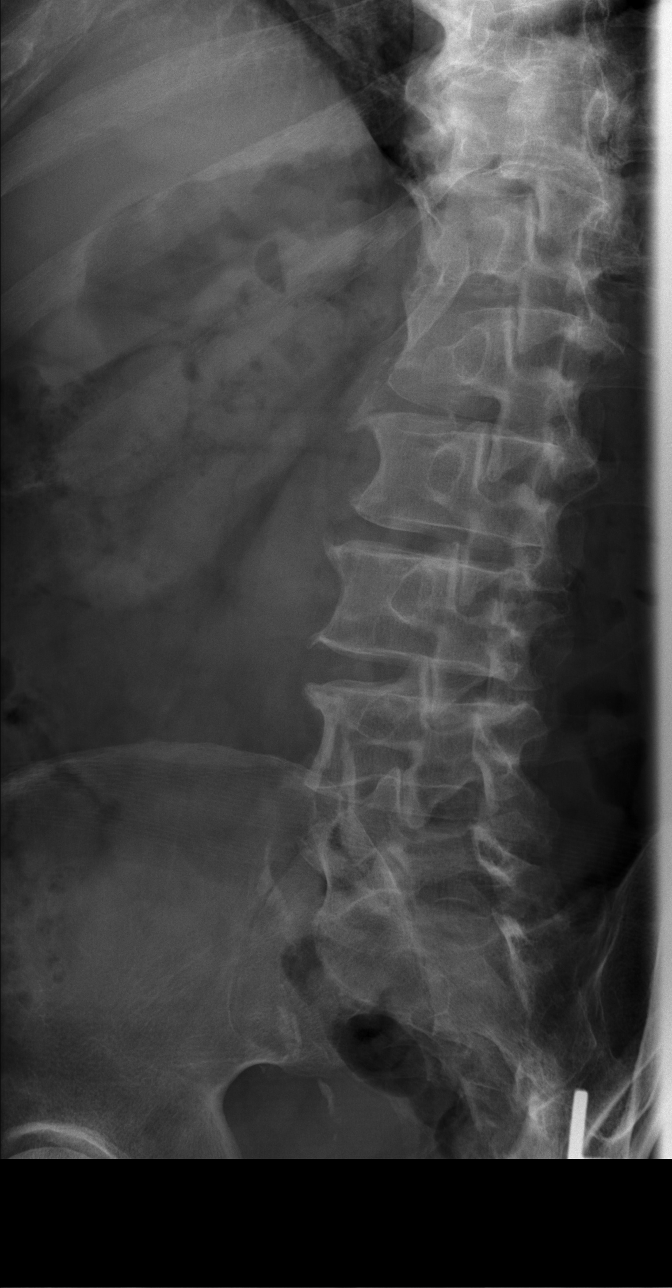

[5 of 5 positions shown; findings below may reference images not displayed]

FINDINGS: No acute fracture or malalignment.

Remote small Schmorl's node deformity inferior endplate L5.

Minimal L3-4 and L5-S1 disc space narrowing.

Minimal degenerative changes lower thoracic spine with anterior
osteophytes.

Vascular calcifications.
IMPRESSION: No acute fracture or malalignment.

Minimal L3-4 and L5-S1 disc space narrowing.

Aortic Atherosclerosis [place]

## 2017-08-31 MED ORDER — OXYCODONE-ACETAMINOPHEN 5-325 MG PO TABS
1.0000 | ORAL_TABLET | Freq: Once | ORAL | Status: AC
  Administered 2017-08-31: 1 via ORAL
  Filled 2017-08-31: qty 1

## 2017-08-31 MED ORDER — DEXAMETHASONE SODIUM PHOSPHATE 10 MG/ML IJ SOLN
10.0000 mg | Freq: Once | INTRAMUSCULAR | Status: AC
  Administered 2017-08-31: 10 mg via INTRAMUSCULAR
  Filled 2017-08-31: qty 1

## 2017-08-31 MED ORDER — KETOROLAC TROMETHAMINE 30 MG/ML IJ SOLN
15.0000 mg | Freq: Once | INTRAMUSCULAR | Status: AC
  Administered 2017-08-31: 15 mg via INTRAMUSCULAR
  Filled 2017-08-31: qty 1

## 2017-08-31 MED ORDER — METHYLPREDNISOLONE 4 MG PO TBPK: ORAL_TABLET | ORAL | 0 refills | Status: DC

## 2017-08-31 MED ORDER — GABAPENTIN 100 MG PO CAPS: 100.0000 mg | ORAL_CAPSULE | Freq: Three times a day (TID) | ORAL | 0 refills | Status: DC

## 2017-08-31 MED ORDER — METHOCARBAMOL 500 MG PO TABS: 500.0000 mg | ORAL_TABLET | Freq: Two times a day (BID) | ORAL | 0 refills | Status: DC

## 2017-08-31 MED ORDER — METHOCARBAMOL 500 MG PO TABS
1000.0000 mg | ORAL_TABLET | Freq: Once | ORAL | Status: AC
  Administered 2017-08-31: 1000 mg via ORAL
  Filled 2017-08-31: qty 2

## 2017-08-31 NOTE — ED Notes (Signed)
Patient ambulatory in room with walker without difficulty.

## 2017-08-31 NOTE — ED Triage Notes (Signed)
Patient c/o left lower back pain. Patient states he has pain down the left leg and is unable to bear weight . Patient c/o pain 6 days, but yesterday the worse pain.

## 2017-08-31 NOTE — ED Provider Notes (Signed)
[facility] DEPT Provider Note   CSN: [id number] Arrival date & time: 08/31/17  2595     History   Chief Complaint Chief Complaint  Patient presents with  . Back Pain    HPI Philip Winters is a 57 y.o. male.  Philip Winters is a 57 y.o. Male with a history of chronic back pain, lumbar spinal stenosis and sciatica, hypertension and hyperlipidemia, who presents to the ED for evaluation of left-sided back pain radiating down into his left leg.  She reports acute flareup of pain for the past 5-6 days has been getting worse since onset.  Patient reports he has not had a flareup like this in several years, but does have chronic history of sciatica.  He has been using Aleve, Biofreeze and his TENS unit without improvement in his pain.  She reports pins and needles sensation in his left leg, difficulty with moving and bearing weight, reports he fell and twisted the left knee as a result of this pain yesterday.  He did not hit his head no loss of consciousness no pain or injury from the fall aside from some worsening left knee pain.  Patient denies any numbness, tingling or weakness in the right leg, no saddle anesthesia, no loss of bowel or bladder control.  Patient denies any fevers or chills.  No associated abdominal pain, nausea, vomiting or diarrhea.  Patient denies any urinary symptoms.  Patient reports he is followed by Dr. Nelva Bush with [facility] for his back pain but has not seen him within the last year.  Patient contacted a friend of his who is a radiologist who recommended he come to the ED for evaluation.  No personal history of IV drug use or cancer.  No thoracic back pain or neck pain, no chest pain or shortness of breath.     Past Medical History:  Diagnosis Date  . Allergy   . Arthritis   . GERD (gastroesophageal reflux disease)    occ  . Heart murmur    baby  . High cholesterol   . Hypertension   . Right carotid bruit 06/10/2017  . Sciatica     . Seasonal allergies   . Spinal stenosis     Patient Active Problem List   Diagnosis Date Noted  . Right carotid bruit 06/10/2017  . Paresthesia 06/10/2017  . Left wrist pain 10/09/2016  . Unilateral primary osteoarthritis, right knee 06/24/2016  . S/P total knee replacement using cement, right 06/24/2016  . Chronic pain of right knee 03/27/2016  . HYPERLIPIDEMIA 01/18/2007  . HYPERTENSION 01/18/2007    Past Surgical History:  Procedure Laterality Date  . CARPAL TUNNEL WITH CUBITAL TUNNEL Left 11/10/2013   Procedure: LEFT CARPAL TUNNEL RELEASE;  Surgeon: Cammie Sickle, MD;  Location: [place];  Service: Orthopedics;  Laterality: Left;  . COLONOSCOPY    . KNEE ARTHROSCOPY  [id number]   left and right  . NASAL SINUS SURGERY  05/2015  . TOTAL KNEE ARTHROPLASTY Right 06/24/2016   Procedure: TOTAL KNEE ARTHROPLASTY;  Surgeon: Garald Balding, MD;  Location: [place];  Service: Orthopedics;  Laterality: Right;  . TRIGGER FINGER RELEASE Left 11/10/2013   Procedure: LEFT INDEX A-1 PULLEY RELEASE;  Surgeon: Cammie Sickle, MD;  Location: [place];  Service: Orthopedics;  Laterality: Left;  . ULNAR NERVE TRANSPOSITION Left 11/10/2013   Procedure: ULNAR NERVE TRANSPOSITION;  Surgeon: Cammie Sickle, MD;  Location: [facility];  Service: Orthopedics;  Laterality: Left;        Home Medications    Prior to Admission medications   Medication Sig Start Date End Date Taking? Authorizing Provider  aspirin EC 81 MG tablet Take 81 mg by mouth daily.    [provider]  clindamycin (CLEOCIN) 300 MG capsule Take 1 tablet  1 hour prior to any dental procedure 05/18/17   Garald Balding, MD  Multiple Vitamin (MULTIVITAMIN WITH MINERALS) TABS tablet Take 1 tablet by mouth daily.    [provider]  naproxen sodium (ANAPROX) 220 MG tablet Take 220 mg by mouth 2 (two) times daily with a meal.    [provider]   olmesartan (BENICAR) 40 MG tablet TK 1 T PO QD 01/30/17   [provider]    Family History History reviewed. No pertinent family history.  Social History Social History   Tobacco Use  . Smoking status: Former Smoker    Packs/day: 1.00    Years: 37.00    Pack years: 37.00    Types: Cigarettes    Last attempt to quit: 05/04/2016    Years since quitting: 1.3  . Smokeless tobacco: Never Used  Substance Use Topics  . Alcohol use: Yes    Alcohol/week: 0.0 oz    Comment: occ  . Drug use: No     Allergies   Mushroom extract complex; Penicillins; Bystolic [nebivolol hcl]; Metoprolol; Codeine; and Iodine   Review of Systems Review of Systems  Constitutional: Negative for chills and fever.  HENT: Negative for congestion, rhinorrhea and sore throat.   Respiratory: Negative for shortness of breath.   Cardiovascular: Negative for chest pain.  Gastrointestinal: Negative for abdominal pain, blood in stool, constipation, diarrhea, nausea and vomiting.  Genitourinary: Negative for difficulty urinating, dysuria, frequency and hematuria.  Musculoskeletal: Positive for arthralgias, back pain, gait problem and joint swelling. Negative for neck pain and neck stiffness.  Skin: Negative for color change, rash and wound.  Neurological: Positive for weakness and numbness. Negative for dizziness, syncope, light-headedness and headaches.     Physical Exam Updated Vital Signs BP 129/87 (BP Location: Right Arm)   Pulse (!) 113   Temp (!) 97.5 F (36.4 C) (Oral)   Resp 20   Ht 5\' 8"  (1.727 m)   Wt 74.8 kg (165 lb)   SpO2 97%   BMI 25.09 kg/m   Physical Exam  Constitutional: He appears well-developed and well-nourished. No distress.  Appears in pain but is in no acute distress  HENT:  Head: Normocephalic and atraumatic.  Mouth/Throat: Oropharynx is clear and moist.  Eyes: Pupils are equal, round, and reactive to light. EOM are normal. Right eye exhibits no discharge. Left eye  exhibits no discharge.  Neck: Neck supple.  Cardiovascular: Normal rate, regular rhythm, normal heart sounds and intact distal pulses.  Pulses:      Radial pulses are 2+ on the right side, and 2+ on the left side.       Dorsalis pedis pulses are 2+ on the right side, and 2+ on the left side.       Posterior tibial pulses are 2+ on the right side, and 2+ on the left side.  Pulmonary/Chest: Effort normal and breath sounds normal. No stridor. No respiratory distress. He has no wheezes. He has no rales.  Respirations equal and unlabored, patient able to speak in full sentences, lungs clear to auscultation bilaterally  Abdominal: Soft. Bowel sounds are normal. He exhibits  no distension and no mass. There is no tenderness. There is no guarding.  Abdomen soft, nondistended, no pulsatile masses noted, nontender to palpation in all quadrants  Musculoskeletal: He exhibits no edema or deformity.  No midline lumbar spinal tenderness, tenderness to palpation over the left lower back musculature, no overlying erythema, rash or palpable deformity, pain made worse with range of motion of the lower extremities, positive straight leg raise on the left  Neurological: He is alert. Coordination normal.  Speech is clear, able to follow commands CN III-XII intact Normal strength in upper and lower extremities bilaterally including dorsiflexion and plantar flexion, strong and equal grip strength, right side comparatively slightly stronger than the left side of the lower extremities but patient still exhibiting good strength bilaterally 2+ DTRs in bilateral lower extremities Sensation normal to light and sharp touch Moves extremities without ataxia, coordination intact  Skin: Skin is warm and dry. Capillary refill takes less than 2 seconds. He is not diaphoretic.  Psychiatric: He has a normal mood and affect. His behavior is normal.  Nursing note and vitals reviewed.    ED Treatments / Results  Labs (all labs  ordered are listed, but only abnormal results are displayed) Labs Reviewed - No data to display  EKG None  Radiology Dg Lumbar Spine Complete  Result Date: 08/31/2017 CLINICAL DATA:  57 year old male with left lower back pain extending down left leg. Post fall 6 days ago. Initial encounter. EXAM: LUMBAR SPINE - COMPLETE 4+ VIEW COMPARISON:  None. FINDINGS: No acute fracture or malalignment. Remote small Schmorl's node deformity inferior endplate L5. Minimal L3-4 and L5-S1 disc space narrowing. Minimal degenerative changes lower thoracic spine with anterior osteophytes. Vascular calcifications. IMPRESSION: No acute fracture or malalignment. Minimal L3-4 and L5-S1 disc space narrowing. Aortic Atherosclerosis (ICD10-I70.0). Electronically Signed   By: Genia Del M.D.   On: 08/31/2017 09:39   Dg Knee Complete 4 Views Left  Result Date: 08/31/2017 CLINICAL DATA:  Generalized left knee pain after recent fall. EXAM: LEFT KNEE - COMPLETE 4+ VIEW COMPARISON:  None. FINDINGS: No fracture or dislocation. No significant joint effusion. No suspicious focal osseous lesions. Mild patellofemoral compartment osteoarthritis. Small left tibial tubercle enthesophyte. Vascular calcifications in the posterior soft tissues. No radiopaque foreign bodies. IMPRESSION: 1. No fracture, dislocation or significant joint effusion in the left knee. 2. Mild patellofemoral compartment osteoarthritis. Electronically Signed   By: Ilona Sorrel M.D.   On: 08/31/2017 09:35    Procedures Procedures (including critical care time)  Medications Ordered in ED Medications  ketorolac (TORADOL) 30 MG/ML injection 15 mg (15 mg Intramuscular Given 08/31/17 0936)  methocarbamol (ROBAXIN) tablet 1,000 mg (1,000 mg Oral Given 08/31/17 0936)  dexamethasone (DECADRON) injection 10 mg (10 mg Intramuscular Given 08/31/17 0937)  oxyCODONE-acetaminophen (PERCOCET/ROXICET) 5-325 MG per tablet 1 tablet (1 tablet Oral Given 08/31/17 1026)      Initial Impression / Assessment and Plan / ED Course  I have reviewed the triage vital signs and the nursing notes.  Pertinent labs & imaging results that were available during my care of the patient were reviewed by me and considered in my medical decision making (see chart for details).  Patient with back pain that radiates down the left leg, patient with history of chronic lumbar spinal stenosis and sciatica, but acutely worsened over the past several days.  No neurological deficits and normal neuro exam.  Positive straight leg raise. Patient can walk but states is painful.  No loss of bowel or bladder control.  No concern for cauda equina.  Not feel that emergent MRI is indicated at this time, will get plain films of the lumbar spine as well as the left knee, patient reports he yesterday and thinks he twisted his left knee a bit, mild swelling noted on exam with no overlying erythema. No fever, night sweats, weight loss, h/o cancer, IVDU.  Will treat with Toradol, steroids and muscle relaxers.  X-ray of the lumbar spine shows no acute fracture or malalignment there is minimal disc space narrowing at L3-4 and L5-S1.  Left knee x-ray shows no acute fracture dislocation or joint effusion, does show mild patellofemoral compartment osteoarthritis.  On reevaluation patient reports minimal improvement in pain will give 1 dose of Percocet and have patient ambulate with walker.  Patient ambulatory in the department without difficulty with the assistance of walker.  At this time he is stable for discharge home with close follow-up with Dr. Jeanell Sparrow most with [facility] orth[facility].  Will discharge with Medrol Dosepak, Robaxin and gabapentin.  Encouraged ice and heat.  Strict return precautions discussed.  Patient expressed understanding and is in agreement with plan.   Final Clinical Impressions(s) / ED Diagnoses   Final diagnoses:  Chronic left-sided low back pain with left-sided sciatica    ED  Discharge Orders        Ordered    methylPREDNISolone (MEDROL DOSEPAK) 4 MG TBPK tablet     08/31/17 1201    methocarbamol (ROBAXIN) 500 MG tablet  2 times daily     08/31/17 1201    gabapentin (NEURONTIN) 100 MG capsule  3 times daily     08/31/17 1201       Jacqlyn Larsen, [place] 08/31/17 1637    Fatima Blank, MD 08/31/17 2039

## 2017-08-31 NOTE — Discharge Instructions (Signed)
Your evaluation today shows no evidence of fractures or acute traumatic injury to the back or knee.  Continue to treat pain with Aleve as well as the steroid Dosepak, muscle relaxers and gabapentin.  Muscle relaxers can cause drowsiness do not take when driving, working and do not combine with alcohol.  You will need to follow-up with your orthopedist.  Return to the emergency department for significantly worsening pain, loss of bowel or bladder control, if symptoms are present in both legs, fevers or chills or any other new or concerning symptoms.

## 2017-09-01 DIAGNOSIS — M5416 Radiculopathy, lumbar region: Secondary | ICD-10-CM | POA: Diagnosis not present

## 2017-09-08 DIAGNOSIS — M549 Dorsalgia, unspecified: Secondary | ICD-10-CM | POA: Insufficient documentation

## 2017-09-16 ENCOUNTER — Emergency Department (HOSPITAL_COMMUNITY)
Admission: EM | Admit: 2017-09-16 | Discharge: 2017-09-16 | Disposition: A | Discharge status: Home / Self Care | Payer: 59 | Attending: Emergency Medicine | Admitting: Emergency Medicine

## 2017-09-16 ENCOUNTER — Encounter (HOSPITAL_COMMUNITY): Payer: Self-pay

## 2017-09-16 ENCOUNTER — Other Ambulatory Visit: Payer: Self-pay

## 2017-09-16 DIAGNOSIS — Z79899 Other long term (current) drug therapy: Secondary | ICD-10-CM | POA: Insufficient documentation

## 2017-09-16 DIAGNOSIS — Z96651 Presence of right artificial knee joint: Secondary | ICD-10-CM | POA: Diagnosis not present

## 2017-09-16 DIAGNOSIS — M5440 Lumbago with sciatica, unspecified side: Secondary | ICD-10-CM | POA: Insufficient documentation

## 2017-09-16 DIAGNOSIS — I10 Essential (primary) hypertension: Secondary | ICD-10-CM | POA: Insufficient documentation

## 2017-09-16 DIAGNOSIS — Z7982 Long term (current) use of aspirin: Secondary | ICD-10-CM | POA: Insufficient documentation

## 2017-09-16 DIAGNOSIS — M545 Low back pain: Secondary | ICD-10-CM | POA: Diagnosis present

## 2017-09-16 DIAGNOSIS — Z87891 Personal history of nicotine dependence: Secondary | ICD-10-CM | POA: Diagnosis not present

## 2017-09-16 MED ORDER — GABAPENTIN 300 MG PO CAPS: 300.0000 mg | ORAL_CAPSULE | Freq: Two times a day (BID) | ORAL | 0 refills | Status: DC

## 2017-09-16 MED ORDER — DIAZEPAM 5 MG/ML IJ SOLN
10.0000 mg | Freq: Once | INTRAMUSCULAR | Status: AC
Start: 2017-09-16 — End: 2017-09-16
  Administered 2017-09-16: 10 mg via INTRAMUSCULAR
  Filled 2017-09-16: qty 2

## 2017-09-16 NOTE — ED Notes (Addendum)
Pt transferred to room via wheelchair by this RN. Pt was able to transfer from wheelchair to bed without difficulty. Pt states that he is supposed to have an injection on Tuesday for pain "but I just can't wait, my back hurts". Pt states that this is a chronic problem and he is trying to follow up with ortho about this since his visit at [facility] on 4/15.

## 2017-09-16 NOTE — Discharge Instructions (Addendum)
YOU MAY TAKE UP TO 1000MG  TYLENOL 4 TIMES A DAY (EVERY 6 HOURS). DO NOT TAKE MORE THAN 4000MG  TYLENOL PER DAY. TAKE GABAPENTIN 300MG  TWICE DAILY. FOLLOW UP WITH [facility].RETURN TO ER IF ANY LEG WEAKNESS, NUMBNESS IN GROIN, BOWEL/BLADDER PROBLEMS, OR UNEXPLAINED FEVER.

## 2017-09-16 NOTE — ED Triage Notes (Signed)
Pt endorses back pain x 2 weeks. Has hx of sciatica and back problems. Was seen at [facility] for same 2 weeks ago but states it's getting worse. VSS. Ambulatory.

## 2017-09-16 NOTE — ED Provider Notes (Signed)
[facility] EMERGENCY DEPARTMENT Provider Note   CSN: [id number] Arrival date & time: 09/16/17  1204     History   Chief Complaint Chief Complaint  Patient presents with  . Back Pain    HPI Philip Winters is a 57 y.o. male.  57yo M w/ PMH below including arthritis, spinal stenosis who presents with back pain.  The patient has a history of back problems, 2 weeks ago presented here for a flareup of his back pain.  He was given steroids, Robaxin, and gabapentin.  He has noticed no improvement with these medications, has finished a steroid course and is still on gabapentin.  He has had a few Vicodin pills at home, took half a tablet this morning with no relief.  He continues to have severe, shocklike pain that feels like his previous sciatica problems.  His pain is left-sided on mid to lower back and radiates down L leg.  Pain is worse with movements.  He has not been able to sleep or work due to the severity of the pain.  He has an appointment scheduled with Dr. Rita Ohara in 2 days and has an appointment next week for steroid injection but presents today due to intractable pain.  He denies any fevers, vomiting, leg weakness, saddle anesthesia, or bowel/bladder incontinence.  No IV drug use.  The history is provided by the patient.  Back Pain      Past Medical History:  Diagnosis Date  . Allergy   . Arthritis   . GERD (gastroesophageal reflux disease)    occ  . Heart murmur    baby  . High cholesterol   . Hypertension   . Right carotid bruit 06/10/2017  . Sciatica   . Seasonal allergies   . Spinal stenosis     Patient Active Problem List   Diagnosis Date Noted  . Right carotid bruit 06/10/2017  . Paresthesia 06/10/2017  . Left wrist pain 10/09/2016  . Unilateral primary osteoarthritis, right knee 06/24/2016  . S/P total knee replacement using cement, right 06/24/2016  . Chronic pain of right knee 03/27/2016  . HYPERLIPIDEMIA 01/18/2007  . HYPERTENSION  01/18/2007    Past Surgical History:  Procedure Laterality Date  . CARPAL TUNNEL WITH CUBITAL TUNNEL Left 11/10/2013   Procedure: LEFT CARPAL TUNNEL RELEASE;  Surgeon: Cammie Sickle, MD;  Location: [place];  Service: Orthopedics;  Laterality: Left;  . COLONOSCOPY    . KNEE ARTHROSCOPY  [id number]   left and right  . NASAL SINUS SURGERY  05/2015  . TOTAL KNEE ARTHROPLASTY Right 06/24/2016   Procedure: TOTAL KNEE ARTHROPLASTY;  Surgeon: Garald Balding, MD;  Location: [place];  Service: Orthopedics;  Laterality: Right;  . TRIGGER FINGER RELEASE Left 11/10/2013   Procedure: LEFT INDEX A-1 PULLEY RELEASE;  Surgeon: Cammie Sickle, MD;  Location: [place];  Service: Orthopedics;  Laterality: Left;  . ULNAR NERVE TRANSPOSITION Left 11/10/2013   Procedure: ULNAR NERVE TRANSPOSITION;  Surgeon: Cammie Sickle, MD;  Location: [place];  Service: Orthopedics;  Laterality: Left;        Home Medications    Prior to Admission medications   Medication Sig Start Date End Date Taking? Authorizing Provider  aspirin EC 81 MG tablet Take 81 mg by mouth daily.    [provider]  clindamycin (CLEOCIN) 300 MG capsule Take 1 tablet  1 hour prior to any dental procedure 05/18/17   Durward Fortes,  Vonna Kotyk, MD  gabapentin (NEURONTIN) 300 MG capsule Take 1 capsule (300 mg total) by mouth 2 (two) times daily for 14 days. 09/16/17 09/30/17  Keshawna Dix, Wenda Overland, MD  methocarbamol (ROBAXIN) 500 MG tablet Take 1 tablet (500 mg total) by mouth 2 (two) times daily. 08/31/17   Jacqlyn Larsen, PA-C  methylPREDNISolone (MEDROL DOSEPAK) 4 MG TBPK tablet Take as directed 08/31/17   Jacqlyn Larsen, PA-C  Multiple Vitamin (MULTIVITAMIN WITH MINERALS) TABS tablet Take 1 tablet by mouth daily.    [provider]  naproxen sodium (ANAPROX) 220 MG tablet Take 220 mg by mouth 2 (two) times daily with a meal.    [provider]  olmesartan (BENICAR) 40  MG tablet TK 1 T PO QD 01/30/17   [provider]    Family History History reviewed. No pertinent family history.  Social History Social History   Tobacco Use  . Smoking status: Former Smoker    Packs/day: 1.00    Years: 37.00    Pack years: 37.00    Types: Cigarettes    Last attempt to quit: 05/04/2016    Years since quitting: 1.3  . Smokeless tobacco: Never Used  Substance Use Topics  . Alcohol use: Yes    Alcohol/week: 0.0 oz    Comment: occ  . Drug use: No     Allergies   Mushroom extract complex; Penicillins; Bystolic [nebivolol hcl]; Metoprolol; Codeine; and Iodine   Review of Systems Review of Systems  Musculoskeletal: Positive for back pain.   All other systems reviewed and are negative except that which was mentioned in HPI   Physical Exam Updated Vital Signs BP (!) 142/101 (BP Location: Right Arm)   Pulse (!) 107   Temp 97.6 F (36.4 C) (Oral)   Resp 14   Ht 5\' 8"  (1.727 m)   Wt 74.8 kg (165 lb)   SpO2 92%   BMI 25.09 kg/m   Physical Exam  Constitutional: He is oriented to person, place, and time. He appears well-developed and well-nourished. No distress.  Uncomfortable, constantly moving in bed  HENT:  Head: Normocephalic and atraumatic.  Moist mucous membranes  Eyes: Pupils are equal, round, and reactive to light. Conjunctivae are normal.  Neck: Neck supple.  Cardiovascular: Normal rate, regular rhythm and normal heart sounds.  No murmur heard. Pulmonary/Chest: Effort normal and breath sounds normal.  Abdominal: He exhibits no distension.  Musculoskeletal: He exhibits no edema.  No focal midline or paraspinal muscle tenderness  Neurological: He is alert and oriented to person, place, and time. He displays normal reflexes. No sensory deficit. He exhibits normal muscle tone.  Fluent speech 5/5 strength BLE  Skin: Skin is warm and dry.  Psychiatric:  anxious  Nursing note and vitals reviewed.    ED Treatments / Results   Labs (all labs ordered are listed, but only abnormal results are displayed) Labs Reviewed - No data to display  EKG None  Radiology No results found.  Procedures Procedures (including critical care time)  Medications Ordered in ED Medications  diazepam (VALIUM) injection 10 mg (10 mg Intramuscular Given 09/16/17 1405)     Initial Impression / Assessment and Plan / ED Course  I have reviewed the triage vital signs and the nursing notes.  No signs/ sx of cauda equina, infection, or other acute/lifethreatening cause of his pain. I have discussed that we are limited on treatment options in ED and I do not use narcotics for chronic back pain. Gave IM  valium for muscle spasm at his request.  Because he is on a minimal dose of gabapentin, I did recommend increasing this medication and scheduling Tylenol.  Cautioned on taking no more than 4 g Tylenol daily from all sources including Tylenol alone and Norco.  Discussed other supportive measures.  He already has multiple follow-up appointments for his symptoms.  I have extensively reviewed return precautions.  Patient and his wife have voiced understanding.  Final Clinical Impressions(s) / ED Diagnoses   Final diagnoses:  Low back pain with sciatica, sciatica laterality unspecified, unspecified back pain laterality, unspecified chronicity    ED Discharge Orders        Ordered    gabapentin (NEURONTIN) 300 MG capsule  2 times daily     09/16/17 1400       Lillieann Pavlich, Wenda Overland, MD 09/16/17 1538

## 2017-09-18 DIAGNOSIS — M549 Dorsalgia, unspecified: Secondary | ICD-10-CM | POA: Diagnosis not present

## 2017-09-18 DIAGNOSIS — M546 Pain in thoracic spine: Secondary | ICD-10-CM | POA: Diagnosis not present

## 2017-09-18 DIAGNOSIS — R29898 Other symptoms and signs involving the musculoskeletal system: Secondary | ICD-10-CM | POA: Insufficient documentation

## 2017-09-18 DIAGNOSIS — M47816 Spondylosis without myelopathy or radiculopathy, lumbar region: Secondary | ICD-10-CM | POA: Diagnosis not present

## 2017-09-18 DIAGNOSIS — M48062 Spinal stenosis, lumbar region with neurogenic claudication: Secondary | ICD-10-CM | POA: Diagnosis not present

## 2017-09-18 DIAGNOSIS — M5136 Other intervertebral disc degeneration, lumbar region: Secondary | ICD-10-CM | POA: Diagnosis not present

## 2017-09-24 ENCOUNTER — Other Ambulatory Visit: Payer: Self-pay | Admitting: Neurosurgery

## 2017-09-24 DIAGNOSIS — M5126 Other intervertebral disc displacement, lumbar region: Secondary | ICD-10-CM | POA: Diagnosis not present

## 2017-09-24 DIAGNOSIS — M47816 Spondylosis without myelopathy or radiculopathy, lumbar region: Secondary | ICD-10-CM | POA: Diagnosis not present

## 2017-09-24 DIAGNOSIS — M5136 Other intervertebral disc degeneration, lumbar region: Secondary | ICD-10-CM | POA: Diagnosis not present

## 2017-09-28 ENCOUNTER — Encounter (HOSPITAL_COMMUNITY): Payer: Self-pay | Admitting: *Deleted

## 2017-09-28 ENCOUNTER — Other Ambulatory Visit: Payer: Self-pay

## 2017-09-28 NOTE — Progress Notes (Signed)
Spoke with pt for pre-op call. Pt recently saw Dr. Marlou Porch for tachycardia, heart murmur and blood pressure control. Had Echo done in March, 2019. Was told to follow up in one year with Dr. Marlou Porch and repeat Echo in 3-5 years. Pt denies any chest pain or sob. Pt states he is not diabetic.

## 2017-09-29 ENCOUNTER — Ambulatory Visit (HOSPITAL_COMMUNITY): Payer: 59 | Admitting: Anesthesiology

## 2017-09-29 ENCOUNTER — Observation Stay (HOSPITAL_COMMUNITY)
Admission: RE | Admit: 2017-09-29 | Discharge: 2017-09-30 | Disposition: A | Discharge status: Home / Self Care | Payer: 59 | Source: Ambulatory Visit | Attending: Neurosurgery | Admitting: Neurosurgery

## 2017-09-29 ENCOUNTER — Ambulatory Visit (HOSPITAL_COMMUNITY): Payer: 59

## 2017-09-29 ENCOUNTER — Ambulatory Visit (HOSPITAL_COMMUNITY)
Admission: RE | Disposition: A | Discharge status: Home / Self Care | Payer: Self-pay | Source: Ambulatory Visit | Attending: Neurosurgery

## 2017-09-29 ENCOUNTER — Encounter (HOSPITAL_COMMUNITY): Payer: Self-pay | Admitting: Urology

## 2017-09-29 DIAGNOSIS — M5116 Intervertebral disc disorders with radiculopathy, lumbar region: Secondary | ICD-10-CM | POA: Diagnosis not present

## 2017-09-29 DIAGNOSIS — I10 Essential (primary) hypertension: Secondary | ICD-10-CM | POA: Insufficient documentation

## 2017-09-29 DIAGNOSIS — Z79899 Other long term (current) drug therapy: Secondary | ICD-10-CM | POA: Diagnosis not present

## 2017-09-29 DIAGNOSIS — Z888 Allergy status to other drugs, medicaments and biological substances status: Secondary | ICD-10-CM | POA: Diagnosis not present

## 2017-09-29 DIAGNOSIS — Z87891 Personal history of nicotine dependence: Secondary | ICD-10-CM | POA: Diagnosis not present

## 2017-09-29 DIAGNOSIS — M4726 Other spondylosis with radiculopathy, lumbar region: Secondary | ICD-10-CM | POA: Diagnosis not present

## 2017-09-29 DIAGNOSIS — Z885 Allergy status to narcotic agent status: Secondary | ICD-10-CM | POA: Insufficient documentation

## 2017-09-29 DIAGNOSIS — E785 Hyperlipidemia, unspecified: Secondary | ICD-10-CM | POA: Insufficient documentation

## 2017-09-29 DIAGNOSIS — Z7982 Long term (current) use of aspirin: Secondary | ICD-10-CM | POA: Insufficient documentation

## 2017-09-29 DIAGNOSIS — Z880 Allergy status to penicillin: Secondary | ICD-10-CM | POA: Diagnosis not present

## 2017-09-29 DIAGNOSIS — Z419 Encounter for procedure for purposes other than remedying health state, unspecified: Secondary | ICD-10-CM

## 2017-09-29 DIAGNOSIS — K219 Gastro-esophageal reflux disease without esophagitis: Secondary | ICD-10-CM | POA: Insufficient documentation

## 2017-09-29 DIAGNOSIS — Z981 Arthrodesis status: Secondary | ICD-10-CM | POA: Diagnosis not present

## 2017-09-29 DIAGNOSIS — M5126 Other intervertebral disc displacement, lumbar region: Secondary | ICD-10-CM | POA: Diagnosis present

## 2017-09-29 HISTORY — DX: Tachycardia, unspecified: R00.0

## 2017-09-29 HISTORY — PX: LUMBAR LAMINECTOMY/DECOMPRESSION MICRODISCECTOMY: SHX5026

## 2017-09-29 HISTORY — DX: Pneumonia, unspecified organism: J18.9

## 2017-09-29 LAB — BASIC METABOLIC PANEL
Anion gap: 12 (ref 5–15)
BUN: 12 mg/dL (ref 6–20)
CO2: 22 mmol/L (ref 22–32)
Calcium: 9.9 mg/dL (ref 8.9–10.3)
Chloride: 104 mmol/L (ref 101–111)
Creatinine, Ser: 0.7 mg/dL (ref 0.61–1.24)
GFR calc Af Amer: >60 mL/min (ref >60)
GFR calc non Af Amer: >60 mL/min (ref >60)
Glucose, Bld: 111 mg/dL — ABNORMAL HIGH (ref 65–99)
Potassium: 4.3 mmol/L (ref 3.5–5.1)
Sodium: 138 mmol/L (ref 135–145)

## 2017-09-29 LAB — CBC
HCT: 42.7 % (ref 39.0–52.0)
Hemoglobin: 15 g/dL (ref 13.0–17.0)
MCH: 32.2 pg (ref 26.0–34.0)
MCHC: 35.1 g/dL (ref 30.0–36.0)
MCV: 91.6 fL (ref 78.0–100.0)
Platelets: 216 10*3/uL (ref 150–400)
RBC: 4.66 MIL/uL (ref 4.22–5.81)
RDW: 11.6 % (ref 11.5–15.5)
WBC: 5 10*3/uL (ref 4.0–10.5)

## 2017-09-29 LAB — SURGICAL PCR SCREEN
MRSA, PCR: NEGATIVE
Staphylococcus aureus: NEGATIVE

## 2017-09-29 IMAGING — CR DG LUMBAR SPINE 2-3V
2 series · 2 of 2 positions shown · non-contrast
Comparison: Portable intraoperative cross-table lateral view at
[WC] hours compared to MRI lumbar spine [DATE] and lumbar spine
radiographs [DATE]

CLINICAL DATA: Opening image for LEFT L2-L3 laminectomy and
microdiscectomy

EXAM:
LUMBAR SPINE - 2-3 VIEW

[lateral (1 of 2)]
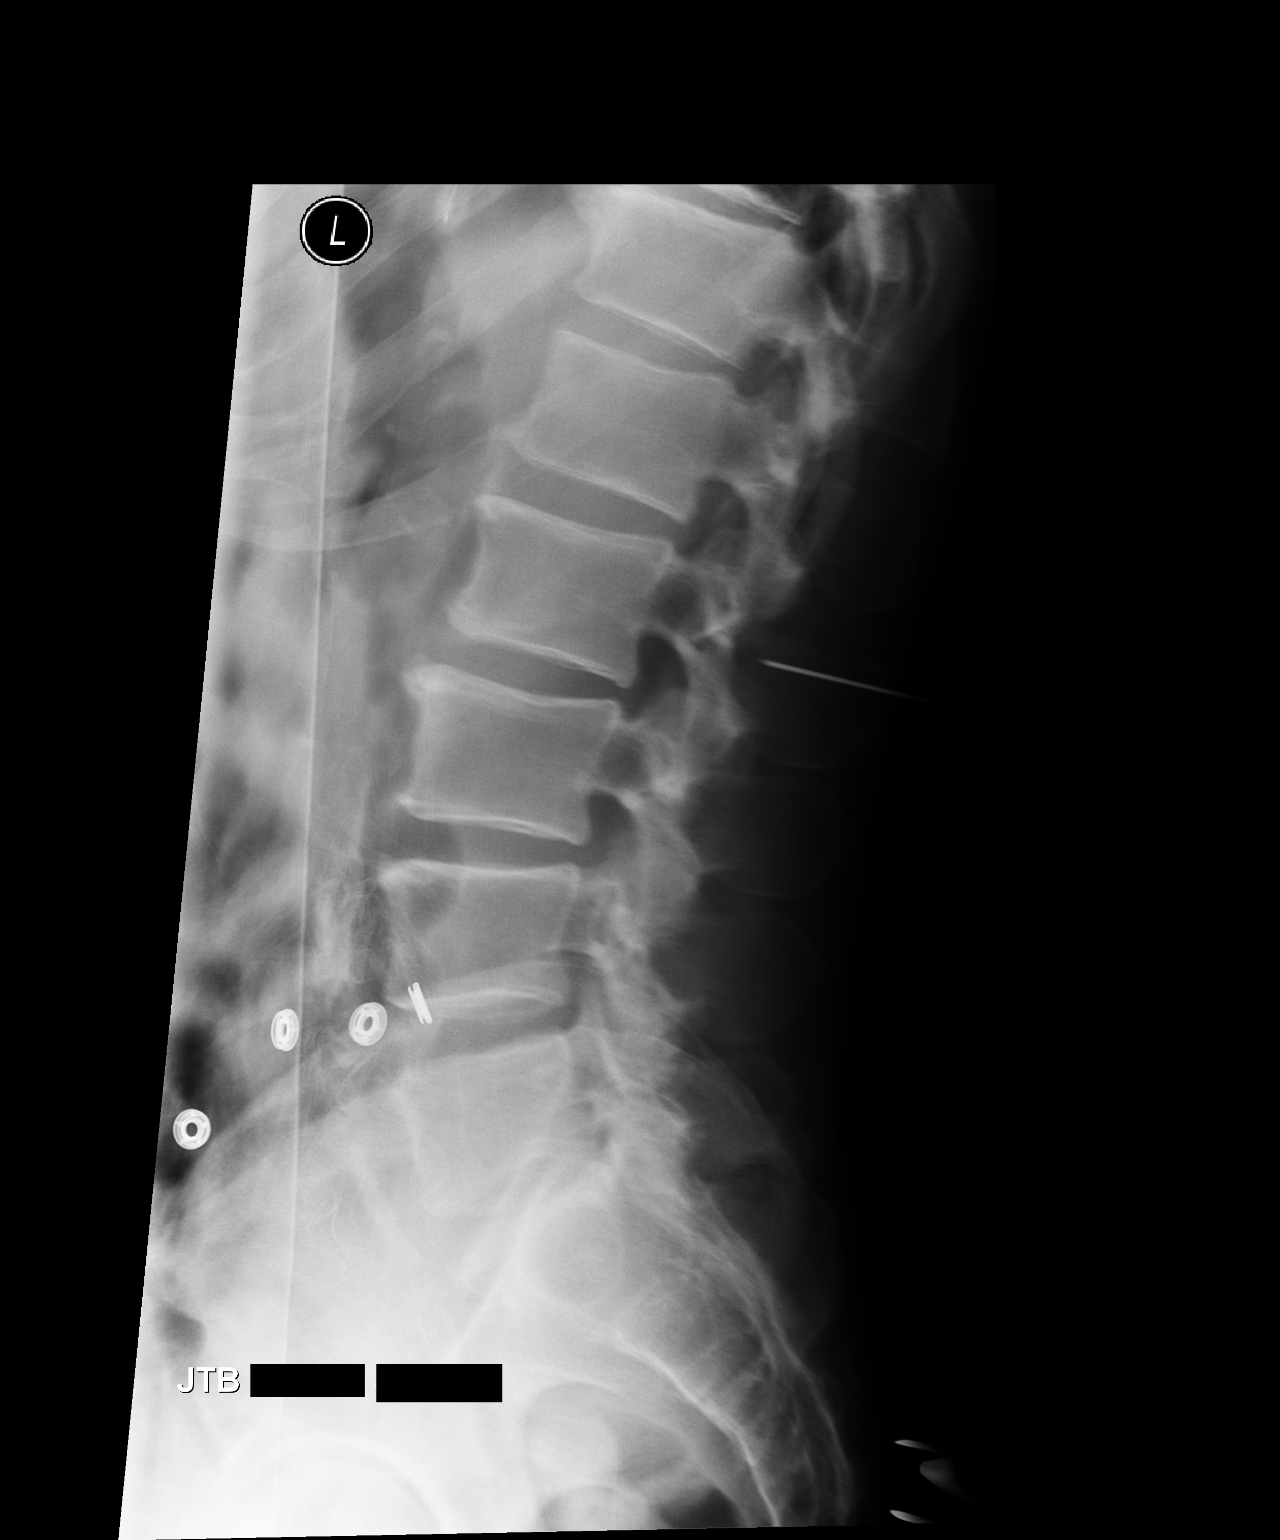

[lateral (2 of 2)]
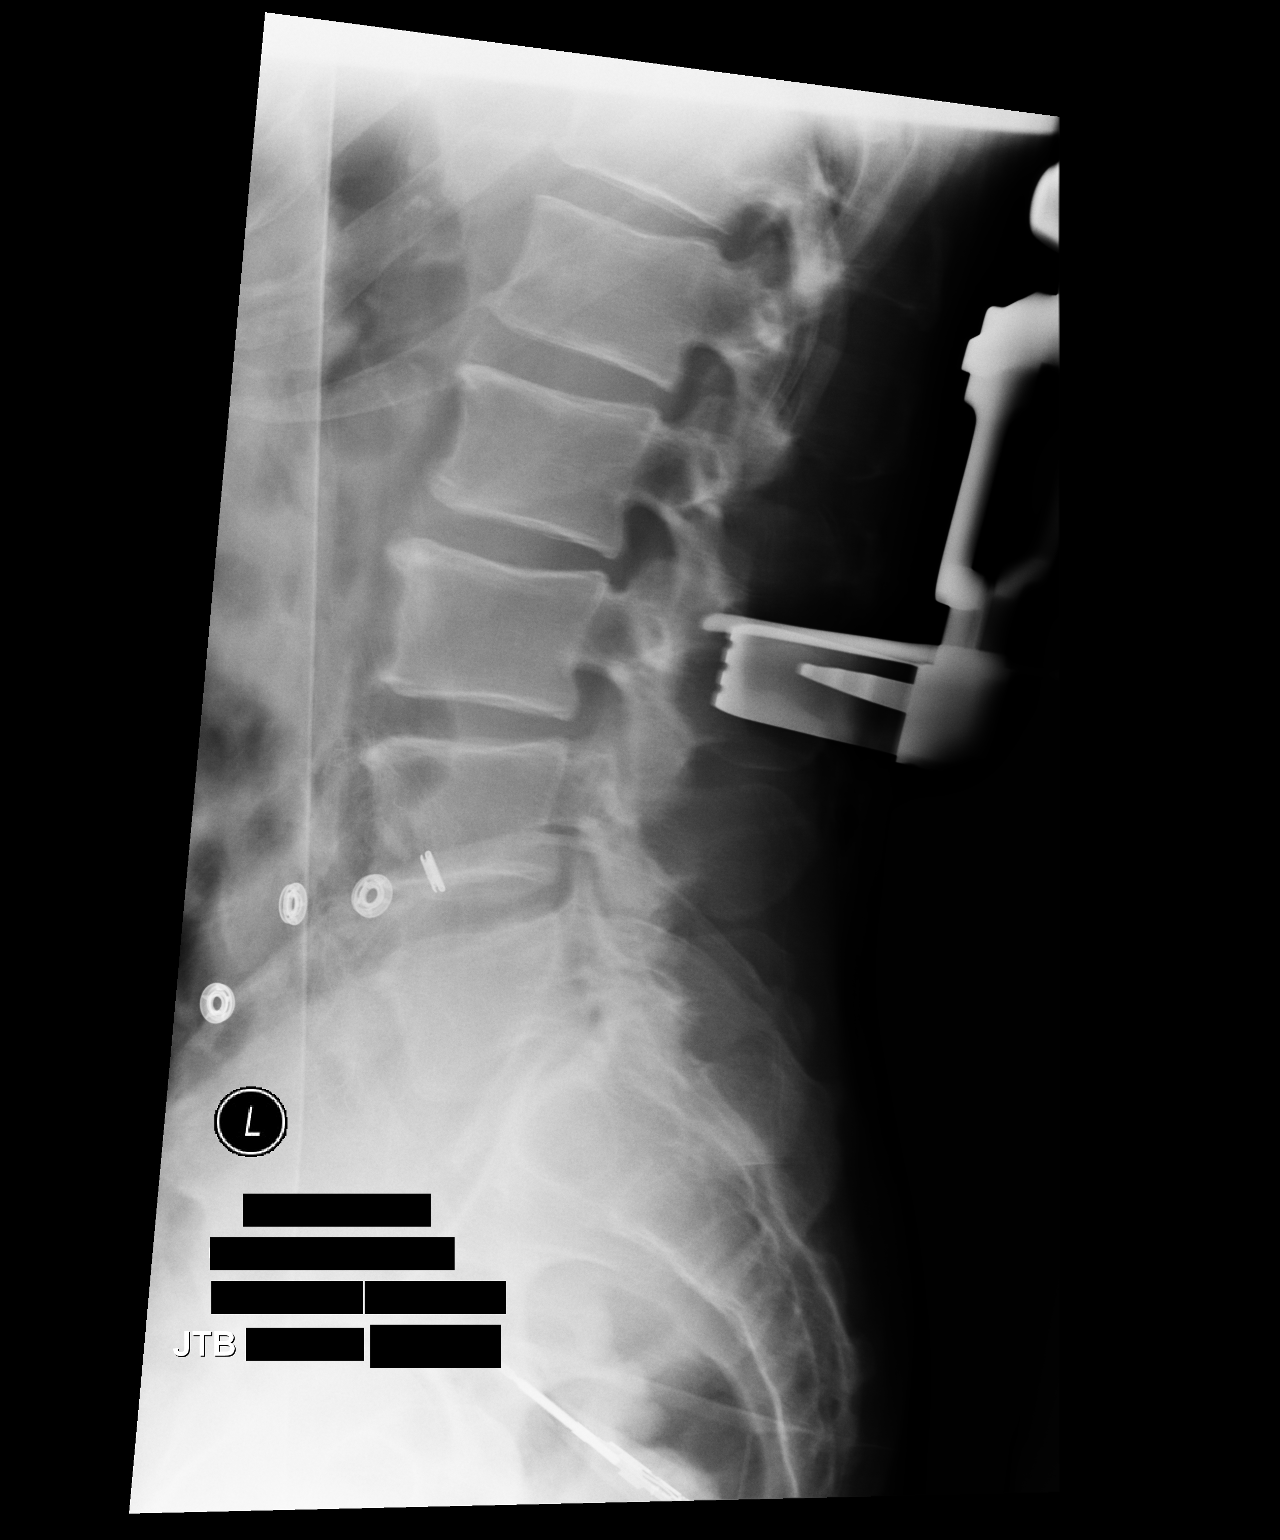

[2 of 2 positions shown; findings below may reference images not displayed]

FINDINGS: Prior MRI labeled with 5 lumbar vertebra.

Metallic probe via dorsal approach projects dorsal to the mid L2
level, projecting over the L2 spinous process.
IMPRESSION: Dorsal localization of the mid L2 level.

## 2017-09-29 SURGERY — LUMBAR LAMINECTOMY/DECOMPRESSION MICRODISCECTOMY 1 LEVEL
Anesthesia: General | Site: Spine Lumbar | Laterality: Left

## 2017-09-29 MED ORDER — BUPIVACAINE HCL (PF) 0.5 % IJ SOLN
INTRAMUSCULAR | Status: AC
  Filled 2017-09-29: qty 30

## 2017-09-29 MED ORDER — MIDAZOLAM HCL 5 MG/5ML IJ SOLN
INTRAMUSCULAR | Status: DC | PRN
  Administered 2017-09-29: 2 mg via INTRAVENOUS

## 2017-09-29 MED ORDER — FLEET ENEMA 7-19 GM/118ML RE ENEM: 1.0000 | ENEMA | Freq: Once | RECTAL | Status: DC | PRN

## 2017-09-29 MED ORDER — THROMBIN (RECOMBINANT) 5000 UNITS EX SOLR
CUTANEOUS | Status: DC | PRN
  Administered 2017-09-29 (×2): 5000 [IU] via TOPICAL

## 2017-09-29 MED ORDER — MIDAZOLAM HCL 2 MG/2ML IJ SOLN
INTRAMUSCULAR | Status: AC
  Filled 2017-09-29: qty 2

## 2017-09-29 MED ORDER — SODIUM CHLORIDE 0.9% FLUSH
3.0000 mL | Freq: Two times a day (BID) | INTRAVENOUS | Status: DC
  Administered 2017-09-29: 3 mL via INTRAVENOUS

## 2017-09-29 MED ORDER — CHLORHEXIDINE GLUCONATE CLOTH 2 % EX PADS: 6.0000 | MEDICATED_PAD | Freq: Once | CUTANEOUS | Status: DC

## 2017-09-29 MED ORDER — ESMOLOL HCL 100 MG/10ML IV SOLN
INTRAVENOUS | Status: DC | PRN
  Administered 2017-09-29 (×5): 20 mg via INTRAVENOUS

## 2017-09-29 MED ORDER — FENTANYL CITRATE (PF) 100 MCG/2ML IJ SOLN
INTRAMUSCULAR | Status: AC
  Filled 2017-09-29: qty 2

## 2017-09-29 MED ORDER — SODIUM CHLORIDE 0.9 % IV SOLN
INTRAVENOUS | Status: DC | PRN
  Administered 2017-09-29: 16:00:00

## 2017-09-29 MED ORDER — IRBESARTAN 300 MG PO TABS
300.0000 mg | ORAL_TABLET | Freq: Every day | ORAL | Status: DC
  Administered 2017-09-29: 300 mg via ORAL
  Filled 2017-09-29 (×2): qty 1

## 2017-09-29 MED ORDER — ONDANSETRON HCL 4 MG/2ML IJ SOLN
INTRAMUSCULAR | Status: DC | PRN
  Administered 2017-09-29: 4 mg via INTRAVENOUS

## 2017-09-29 MED ORDER — DEXAMETHASONE SODIUM PHOSPHATE 10 MG/ML IJ SOLN
INTRAMUSCULAR | Status: DC | PRN
  Administered 2017-09-29: 10 mg via INTRAVENOUS

## 2017-09-29 MED ORDER — BUPIVACAINE HCL (PF) 0.5 % IJ SOLN
INTRAMUSCULAR | Status: DC | PRN
  Administered 2017-09-29: 15 mL

## 2017-09-29 MED ORDER — KETOROLAC TROMETHAMINE 30 MG/ML IJ SOLN
30.0000 mg | Freq: Four times a day (QID) | INTRAMUSCULAR | Status: DC
  Administered 2017-09-29 – 2017-09-30 (×2): 30 mg via INTRAVENOUS
  Filled 2017-09-29 (×2): qty 1

## 2017-09-29 MED ORDER — LIDOCAINE-EPINEPHRINE 1 %-1:100000 IJ SOLN
INTRAMUSCULAR | Status: AC
  Filled 2017-09-29: qty 1

## 2017-09-29 MED ORDER — KETOROLAC TROMETHAMINE 30 MG/ML IJ SOLN
INTRAMUSCULAR | Status: AC
  Filled 2017-09-29: qty 1

## 2017-09-29 MED ORDER — MUPIROCIN 2 % EX OINT
TOPICAL_OINTMENT | CUTANEOUS | Status: AC
  Filled 2017-09-29: qty 22

## 2017-09-29 MED ORDER — HYDROMORPHONE HCL 2 MG/ML IJ SOLN
INTRAMUSCULAR | Status: AC
  Administered 2017-09-29: 0.5 mg via INTRAVENOUS
  Filled 2017-09-29: qty 1

## 2017-09-29 MED ORDER — VANCOMYCIN HCL IN DEXTROSE 1-5 GM/200ML-% IV SOLN
1000.0000 mg | INTRAVENOUS | Status: AC
  Administered 2017-09-29: 1000 mg via INTRAVENOUS

## 2017-09-29 MED ORDER — BISACODYL 10 MG RE SUPP
10.0000 mg | Freq: Every day | RECTAL | Status: DC | PRN
Start: 2017-09-29 — End: 2017-09-30

## 2017-09-29 MED ORDER — SUGAMMADEX SODIUM 200 MG/2ML IV SOLN
INTRAVENOUS | Status: DC | PRN
  Administered 2017-09-29: 200 mg via INTRAVENOUS

## 2017-09-29 MED ORDER — FENTANYL CITRATE (PF) 250 MCG/5ML IJ SOLN
INTRAMUSCULAR | Status: AC
  Filled 2017-09-29: qty 5

## 2017-09-29 MED ORDER — LACTATED RINGERS IV SOLN
INTRAVENOUS | Status: DC
  Administered 2017-09-29 (×3): via INTRAVENOUS

## 2017-09-29 MED ORDER — ALUM & MAG HYDROXIDE-SIMETH 200-200-20 MG/5ML PO SUSP: 30.0000 mL | Freq: Four times a day (QID) | ORAL | Status: DC | PRN

## 2017-09-29 MED ORDER — LIDOCAINE-EPINEPHRINE 1 %-1:100000 IJ SOLN
INTRAMUSCULAR | Status: DC | PRN
  Administered 2017-09-29: 15 mL

## 2017-09-29 MED ORDER — PHENOL 1.4 % MT LIQD: 1.0000 | OROMUCOSAL | Status: DC | PRN

## 2017-09-29 MED ORDER — ACETAMINOPHEN 10 MG/ML IV SOLN
INTRAVENOUS | Status: DC | PRN
  Administered 2017-09-29: 1000 mg via INTRAVENOUS

## 2017-09-29 MED ORDER — FENTANYL CITRATE (PF) 100 MCG/2ML IJ SOLN
INTRAMUSCULAR | Status: DC | PRN
  Administered 2017-09-29: 100 ug via INTRAVENOUS
  Administered 2017-09-29: 50 ug via INTRAVENOUS
  Administered 2017-09-29 (×2): 100 ug via INTRAVENOUS
  Administered 2017-09-29 (×3): 50 ug via INTRAVENOUS

## 2017-09-29 MED ORDER — PROMETHAZINE HCL 25 MG/ML IJ SOLN: 6.2500 mg | INTRAMUSCULAR | Status: DC | PRN

## 2017-09-29 MED ORDER — HYDROXYZINE HCL 50 MG/ML IM SOLN: 50.0000 mg | INTRAMUSCULAR | Status: DC | PRN

## 2017-09-29 MED ORDER — PROPOFOL 10 MG/ML IV BOLUS
INTRAVENOUS | Status: DC | PRN
  Administered 2017-09-29: 200 mg via INTRAVENOUS

## 2017-09-29 MED ORDER — KCL IN DEXTROSE-NACL 20-5-0.45 MEQ/L-%-% IV SOLN: INTRAVENOUS | Status: DC

## 2017-09-29 MED ORDER — DEXAMETHASONE SODIUM PHOSPHATE 10 MG/ML IJ SOLN
INTRAMUSCULAR | Status: AC
  Filled 2017-09-29: qty 1

## 2017-09-29 MED ORDER — ACETAMINOPHEN 325 MG PO TABS: 650.0000 mg | ORAL_TABLET | ORAL | Status: DC | PRN

## 2017-09-29 MED ORDER — CYCLOBENZAPRINE HCL 5 MG PO TABS: 5.0000 mg | ORAL_TABLET | Freq: Three times a day (TID) | ORAL | Status: DC | PRN

## 2017-09-29 MED ORDER — HYDROMORPHONE HCL 2 MG/ML IJ SOLN
0.2500 mg | INTRAMUSCULAR | Status: DC | PRN
  Administered 2017-09-29 (×4): 0.5 mg via INTRAVENOUS

## 2017-09-29 MED ORDER — SODIUM CHLORIDE 0.9% FLUSH: 3.0000 mL | INTRAVENOUS | Status: DC | PRN

## 2017-09-29 MED ORDER — LABETALOL HCL 5 MG/ML IV SOLN
INTRAVENOUS | Status: DC | PRN
  Administered 2017-09-29: 10 mg via INTRAVENOUS

## 2017-09-29 MED ORDER — MAGNESIUM HYDROXIDE 400 MG/5ML PO SUSP: 30.0000 mL | Freq: Every day | ORAL | Status: DC | PRN

## 2017-09-29 MED ORDER — MUPIROCIN 2 % EX OINT
1.0000 "application " | TOPICAL_OINTMENT | Freq: Once | CUTANEOUS | Status: AC
  Administered 2017-09-29: 13:00:00 via TOPICAL

## 2017-09-29 MED ORDER — ESMOLOL HCL 100 MG/10ML IV SOLN
INTRAVENOUS | Status: AC
  Filled 2017-09-29: qty 10

## 2017-09-29 MED ORDER — PHENYLEPHRINE 40 MCG/ML (10ML) SYRINGE FOR IV PUSH (FOR BLOOD PRESSURE SUPPORT)
PREFILLED_SYRINGE | INTRAVENOUS | Status: AC
  Filled 2017-09-29: qty 10

## 2017-09-29 MED ORDER — HYDROCODONE-ACETAMINOPHEN 5-325 MG PO TABS
ORAL_TABLET | ORAL | Status: AC
  Filled 2017-09-29: qty 2

## 2017-09-29 MED ORDER — PHENYLEPHRINE HCL 10 MG/ML IJ SOLN
INTRAMUSCULAR | Status: DC | PRN
  Administered 2017-09-29: 120 ug via INTRAVENOUS
  Administered 2017-09-29 (×3): 80 ug via INTRAVENOUS

## 2017-09-29 MED ORDER — GENTAMICIN IN SALINE 1.6-0.9 MG/ML-% IV SOLN
80.0000 mg | INTRAVENOUS | Status: AC
  Administered 2017-09-29: 80 mg via INTRAVENOUS
  Filled 2017-09-29: qty 50

## 2017-09-29 MED ORDER — FENTANYL CITRATE (PF) 100 MCG/2ML IJ SOLN
INTRAMUSCULAR | Status: DC | PRN
  Administered 2017-09-29: 100 ug via INTRAVENOUS

## 2017-09-29 MED ORDER — PROPOFOL 10 MG/ML IV BOLUS
INTRAVENOUS | Status: AC
  Filled 2017-09-29: qty 20

## 2017-09-29 MED ORDER — LIDOCAINE HCL (CARDIAC) PF 100 MG/5ML IV SOSY
PREFILLED_SYRINGE | INTRAVENOUS | Status: DC | PRN
  Administered 2017-09-29: 100 mg via INTRAVENOUS

## 2017-09-29 MED ORDER — THROMBIN (RECOMBINANT) 5000 UNITS EX SOLR
CUTANEOUS | Status: DC | PRN
  Administered 2017-09-29: 17:00:00 via TOPICAL

## 2017-09-29 MED ORDER — HYDROXYZINE HCL 50 MG PO TABS
50.0000 mg | ORAL_TABLET | ORAL | Status: DC | PRN
  Administered 2017-09-29: 50 mg via ORAL
  Filled 2017-09-29 (×2): qty 1

## 2017-09-29 MED ORDER — ROCURONIUM BROMIDE 100 MG/10ML IV SOLN
INTRAVENOUS | Status: DC | PRN
  Administered 2017-09-29: 20 mg via INTRAVENOUS
  Administered 2017-09-29: 50 mg via INTRAVENOUS

## 2017-09-29 MED ORDER — THROMBIN 5000 UNITS EX SOLR
CUTANEOUS | Status: AC
  Filled 2017-09-29: qty 15000

## 2017-09-29 MED ORDER — VANCOMYCIN HCL IN DEXTROSE 1-5 GM/200ML-% IV SOLN
INTRAVENOUS | Status: AC
  Administered 2017-09-29: 1000 mg via INTRAVENOUS
  Filled 2017-09-29: qty 200

## 2017-09-29 MED ORDER — METHYLPREDNISOLONE ACETATE 80 MG/ML IJ SUSP
INTRAMUSCULAR | Status: AC
  Filled 2017-09-29: qty 1

## 2017-09-29 MED ORDER — METHYLPREDNISOLONE ACETATE 80 MG/ML IJ SUSP
INTRAMUSCULAR | Status: DC | PRN
  Administered 2017-09-29: 80 mg

## 2017-09-29 MED ORDER — DEXTROSE 5 % IV SOLN
INTRAVENOUS | Status: DC | PRN
  Administered 2017-09-29: 50 ug/min via INTRAVENOUS

## 2017-09-29 MED ORDER — ACETAMINOPHEN 650 MG RE SUPP: 650.0000 mg | RECTAL | Status: DC | PRN

## 2017-09-29 MED ORDER — HEMOSTATIC AGENTS (NO CHARGE) OPTIME
TOPICAL | Status: DC | PRN
  Administered 2017-09-29: 1 via TOPICAL

## 2017-09-29 MED ORDER — HYDROCODONE-ACETAMINOPHEN 5-325 MG PO TABS
1.0000 | ORAL_TABLET | ORAL | Status: DC | PRN
  Administered 2017-09-29 (×2): 2 via ORAL
  Administered 2017-09-30 (×2): 1 via ORAL
  Filled 2017-09-29 (×2): qty 1
  Filled 2017-09-29: qty 2

## 2017-09-29 MED ORDER — 0.9 % SODIUM CHLORIDE (POUR BTL) OPTIME
TOPICAL | Status: DC | PRN
  Administered 2017-09-29: 1000 mL

## 2017-09-29 MED ORDER — KETOROLAC TROMETHAMINE 30 MG/ML IJ SOLN
30.0000 mg | Freq: Once | INTRAMUSCULAR | Status: AC
  Administered 2017-09-29: 30 mg via INTRAVENOUS

## 2017-09-29 MED ORDER — MENTHOL 3 MG MT LOZG: 1.0000 | LOZENGE | OROMUCOSAL | Status: DC | PRN

## 2017-09-29 MED ORDER — MORPHINE SULFATE (PF) 4 MG/ML IV SOLN: 4.0000 mg | INTRAVENOUS | Status: DC | PRN

## 2017-09-29 MED ORDER — ACETAMINOPHEN 10 MG/ML IV SOLN
INTRAVENOUS | Status: AC
  Filled 2017-09-29: qty 100

## 2017-09-29 SURGICAL SUPPLY — 56 items
ADH SKN CLS APL DERMABOND .7 (GAUZE/BANDAGES/DRESSINGS) ×1
APL SKNCLS STERI-STRIP NONHPOA (GAUZE/BANDAGES/DRESSINGS)
BAG DECANTER FOR FLEXI CONT (MISCELLANEOUS) ×2 IMPLANT
BENZOIN TINCTURE PRP APPL 2/3 (GAUZE/BANDAGES/DRESSINGS) IMPLANT
BLADE CLIPPER SURG (BLADE) IMPLANT
BUR ACRON 5.0MM COATED (BURR) IMPLANT
BUR MATCHSTICK NEURO 3.0 LAGG (BURR) ×2 IMPLANT
CANISTER SUCT 3000ML PPV (MISCELLANEOUS) ×2 IMPLANT
CARTRIDGE OIL MAESTRO DRILL (MISCELLANEOUS) ×1 IMPLANT
DECANTER SPIKE VIAL GLASS SM (MISCELLANEOUS) ×4 IMPLANT
DERMABOND ADVANCED (GAUZE/BANDAGES/DRESSINGS) ×1
DERMABOND ADVANCED .7 DNX12 (GAUZE/BANDAGES/DRESSINGS) ×1 IMPLANT
DIFFUSER DRILL AIR PNEUMATIC (MISCELLANEOUS) ×2 IMPLANT
DRAPE LAPAROTOMY 100X72X124 (DRAPES) ×2 IMPLANT
DRAPE MICROSCOPE LEICA (MISCELLANEOUS) ×2 IMPLANT
DRAPE POUCH INSTRU U-SHP 10X18 (DRAPES) ×2 IMPLANT
ELECT REM PT RETURN 9FT ADLT (ELECTROSURGICAL) ×2
ELECTRODE REM PT RTRN 9FT ADLT (ELECTROSURGICAL) ×1 IMPLANT
GAUZE SPONGE 4X4 12PLY STRL (GAUZE/BANDAGES/DRESSINGS) ×2 IMPLANT
GAUZE SPONGE 4X4 16PLY XRAY LF (GAUZE/BANDAGES/DRESSINGS) IMPLANT
GLOVE BIOGEL PI IND STRL 8 (GLOVE) ×3 IMPLANT
GLOVE BIOGEL PI INDICATOR 8 (GLOVE) ×3
GLOVE ECLIPSE 7.5 STRL STRAW (GLOVE) ×2 IMPLANT
GLOVE EXAM NITRILE LRG STRL (GLOVE) IMPLANT
GLOVE EXAM NITRILE XL STR (GLOVE) IMPLANT
GLOVE EXAM NITRILE XS STR PU (GLOVE) IMPLANT
GOWN STRL REUS W/ TWL LRG LVL3 (GOWN DISPOSABLE) ×1 IMPLANT
GOWN STRL REUS W/ TWL XL LVL3 (GOWN DISPOSABLE) ×1 IMPLANT
GOWN STRL REUS W/TWL 2XL LVL3 (GOWN DISPOSABLE) ×2 IMPLANT
GOWN STRL REUS W/TWL LRG LVL3 (GOWN DISPOSABLE) ×2
GOWN STRL REUS W/TWL XL LVL3 (GOWN DISPOSABLE) ×2
HEMOSTAT POWDER SURGIFOAM 1G (HEMOSTASIS) ×2 IMPLANT
KIT BASIN OR (CUSTOM PROCEDURE TRAY) ×2 IMPLANT
KIT TURNOVER KIT B (KITS) ×2 IMPLANT
NEEDLE HYPO 18GX1.5 BLUNT FILL (NEEDLE) IMPLANT
NEEDLE SPNL 18GX3.5 QUINCKE PK (NEEDLE) ×2 IMPLANT
NEEDLE SPNL 22GX3.5 QUINCKE BK (NEEDLE) ×2 IMPLANT
NS IRRIG 1000ML POUR BTL (IV SOLUTION) ×2 IMPLANT
OIL CARTRIDGE MAESTRO DRILL (MISCELLANEOUS) ×2
PACK LAMINECTOMY NEURO (CUSTOM PROCEDURE TRAY) ×2 IMPLANT
PAD ARMBOARD 7.5X6 YLW CONV (MISCELLANEOUS) ×10 IMPLANT
PATTIES SURGICAL .5 X1 (DISPOSABLE) IMPLANT
RUBBERBAND STERILE (MISCELLANEOUS) ×4 IMPLANT
SPONGE LAP 4X18 X RAY DECT (DISPOSABLE) IMPLANT
SPONGE SURGIFOAM ABS GEL SZ50 (HEMOSTASIS) IMPLANT
STRIP CLOSURE SKIN 1/2X4 (GAUZE/BANDAGES/DRESSINGS) IMPLANT
SUT PROLENE 6 0 BV (SUTURE) IMPLANT
SUT VIC AB 1 CT1 18XBRD ANBCTR (SUTURE) ×1 IMPLANT
SUT VIC AB 1 CT1 8-18 (SUTURE) ×2
SUT VIC AB 2-0 CP2 18 (SUTURE) ×2 IMPLANT
SUT VIC AB 3-0 SH 8-18 (SUTURE) IMPLANT
SYR 5ML LL (SYRINGE) IMPLANT
TAPE CLOTH SURG 4X10 WHT LF (GAUZE/BANDAGES/DRESSINGS) ×2 IMPLANT
TOWEL GREEN STERILE (TOWEL DISPOSABLE) ×2 IMPLANT
TOWEL GREEN STERILE FF (TOWEL DISPOSABLE) ×2 IMPLANT
WATER STERILE IRR 1000ML POUR (IV SOLUTION) ×2 IMPLANT

## 2017-09-29 NOTE — Anesthesia Postprocedure Evaluation (Signed)
Anesthesia Post Note  Patient: Lanice Schwab Denunzio  Procedure(s) Performed: LEFT LUMBAR TWO - LUMBAR THREELAMINOTOMY/MICRODISCECTOMY (Left Spine Lumbar)     Patient location during evaluation: PACU Anesthesia Type: General Level of consciousness: awake and alert Pain management: pain level controlled Vital Signs Assessment: post-procedure vital signs reviewed and stable Respiratory status: spontaneous breathing, nonlabored ventilation, respiratory function stable and patient connected to nasal cannula oxygen Cardiovascular status: blood pressure returned to baseline and stable Postop Assessment: no apparent nausea or vomiting Anesthetic complications: no    Last Vitals:  Vitals:   09/29/17 1808 09/29/17 1824  BP: (!) 133/93 132/89  Pulse: (!) 106 (!) 103  Resp: 17 (!) 22  Temp:  36.9 C  SpO2: 95% 94%    Last Pain:  Vitals:   09/29/17 1824  TempSrc:   PainSc: 3                  Effie Berkshire

## 2017-09-29 NOTE — Anesthesia Preprocedure Evaluation (Addendum)
Anesthesia Evaluation  Patient identified by MRN, date of birth, ID band Patient awake    Reviewed: Allergy & Precautions, NPO status , Patient's Chart, lab work & pertinent test results  Airway Mallampati: III  TM Distance: >3 FB Neck ROM: Full    Dental no notable dental hx.    Pulmonary former smoker,    Pulmonary exam normal breath sounds clear to auscultation       Cardiovascular hypertension, Pt. on medications + Valvular Problems/Murmurs AS and AI  Rhythm:Regular Rate:Tachycardia  ECG: ST, rate 111  ECHO: Normal EF 60% Bicuspid or functionally bicuspid aortic valve with thickened leaflets. Mild aortic stenosis and mild aortic regurgitation. (murmur heard on exam)  Pre-op eval per Skains for tachycardia   Neuro/Psych negative neurological ROS  negative psych ROS   GI/Hepatic Neg liver ROS,   Endo/Other  negative endocrine ROS  Renal/GU negative Renal ROS     Musculoskeletal Spinal stenosis Sciatica   Abdominal   Peds  Hematology HLD   Anesthesia Other Findings HERNIATED NUCLEUS PULPOSUS, LUMBAR  Reproductive/Obstetrics                           Anesthesia Physical Anesthesia Plan  ASA: II  Anesthesia Plan: General   Post-op Pain Management:    Induction: Intravenous  PONV Risk Score and Plan: 2 and Ondansetron, Dexamethasone, Midazolam and Treatment may vary due to age or medical condition  Airway Management Planned: Oral ETT  Additional Equipment:   Intra-op Plan:   Post-operative Plan: Extubation in OR  Informed Consent: I have reviewed the patients History and Physical, chart, labs and discussed the procedure including the risks, benefits and alternatives for the proposed anesthesia with the patient or authorized representative who has indicated his/her understanding and acceptance.   Dental advisory given  Plan Discussed with: CRNA  Anesthesia Plan Comments:          Anesthesia Quick Evaluation

## 2017-09-29 NOTE — Op Note (Signed)
09/29/2017  5:02 PM  PATIENT:  Philip Winters  57 y.o. male  PRE-OPERATIVE DIAGNOSIS: Left L2-3 lumbar disc herniation, left lumbar radiculopathy, lumbar degenerative disease, lumbar spondylosis  POST-OPERATIVE DIAGNOSIS:  Left L2-3 lumbar disc herniation, left lumbar radiculopathy, lumbar degenerative disease, lumbar spondylosis  PROCEDURE:  Procedure(s): Left L2-3 lumbar laminotomy and microdiscectomy, with microdissection, microsurgical technique, and the operating microscope  SURGEON:  Surgeon(s): Jovita Gamma, MD  ASSISTANTS:  Consuella Lose, MD  ANESTHESIA:   general  EBL:  Total I/O In: 1200 [I.V.:1200] Out: 100 [Blood:100]  BLOOD ADMINISTERED:none  COUNT:  Correct per nursing staff  DICTATION: Patient was brought to the operating room and placed under general endotracheal anesthesia. Patient was turned to prone position the lumbar region was prepped with Betadine soap and solution and draped in a sterile fashion. The midline was infiltrated with local anesthetic with epinephrine. A localizing x-ray was taken and the L2-3 level was identified. Midline incision was made over the L2-3 level and was carried down through the subcutaneous tissue to the lumbar fascia. The lumbar fascia was incised on the left side and the paraspinal muscles were dissected from the spinous processes and lamina in a subperiosteal fashion. Another x-ray was taken and the L2-3 intralaminar space was identified. The operating microscope was draped and brought into the field provided additional magnification, illumination, and visualization. Laminotomy was performed using the high-speed drill and Kerrison punches. The ligamentum flavum was carefully resected. The underlying thecal sac and nerve root were identified.  The epidural space was explored, and the disc herniation was identified behind the body of L3, compressing the exiting left L3 nerve root. The thecal sac and nerve root gently retracted  medially.  The free fragment disc herniation was mobilized with a coronary dilator, and removed in a piecemeal fashion.  We able to progressively decompress the thecal sac and exiting L3 nerve root.  All loose fragments of disc material were removed from the epidural space.  Once the discectomy was completed and good decompression of the thecal sac and nerve root had been achieved hemostasis was established with the use of bipolar cautery and Gelfoam with thrombin. The Gelfoam was removed, and FloSeal was further used to establish hemostasis.  Once hemostasis was confirmed we proceeded with closure. We instilled 2 cc of fentanyl and 80 mg of Depo-Medrol into the epidural space. Deep fascia was closed with interrupted undyed 1 Vicryl sutures.  Scarpas fascia was closed with interrupted inverted 2-0 undyed Vicryl sutures.  The subcutaneous and subcuticular layer were closed with interrupted inverted 2-0 and 3-0 undyed Vicryl sutures. The skin edges were approximated with Dermabond.  Dressing of sterile gauze and Hypafix was applied.  Following surgery the patient was turned back to a supine position to be reversed from the anesthetic extubated and transferred to the recovery room for further care.   PLAN OF CARE: Admit for overnight observation  PATIENT DISPOSITION:  PACU - hemodynamically stable.   Delay start of Pharmacological VTE agent (>24hrs) due to surgical blood loss or risk of bleeding:  yes

## 2017-09-29 NOTE — Anesthesia Procedure Notes (Signed)
Procedure Name: Intubation Date/Time: 09/29/2017 3:30 PM Performed by: Inda Coke, CRNA Pre-anesthesia Checklist: Patient identified, Emergency Drugs available, Suction available and Patient being monitored Patient Re-evaluated:Patient Re-evaluated prior to induction Oxygen Delivery Method: Circle System Utilized Preoxygenation: Pre-oxygenation with 100% oxygen Induction Type: IV induction Ventilation: Mask ventilation without difficulty Laryngoscope Size: Mac and 4 Grade View: Grade I Tube type: Oral Number of attempts: 1 Airway Equipment and Method: Stylet and Oral airway Placement Confirmation: ETT inserted through vocal cords under direct vision,  positive ETCO2 and breath sounds checked- equal and bilateral Secured at: 22 cm Tube secured with: Tape Dental Injury: Teeth and Oropharynx as per pre-operative assessment

## 2017-09-29 NOTE — Transfer of Care (Signed)
Immediate Anesthesia Transfer of Care Note  Patient: Philip Winters  Procedure(s) Performed: LEFT LUMBAR TWO - LUMBAR THREELAMINOTOMY/MICRODISCECTOMY (Left Spine Lumbar)  Patient Location: PACU  Anesthesia Type:General  Level of Consciousness: awake and alert   Airway & Oxygen Therapy: Patient Spontanous Breathing and Patient connected to nasal cannula oxygen  Post-op Assessment: Report given to RN, Post -op Vital signs reviewed and stable and Patient moving all extremities X 4  Post vital signs: Reviewed and stable  Last Vitals:  Vitals Value Taken Time  BP 140/98 09/29/2017  5:06 PM  Temp    Pulse 103 09/29/2017  5:12 PM  Resp 25 09/29/2017  5:12 PM  SpO2 100 % 09/29/2017  5:12 PM  Vitals shown include unvalidated device data.  Last Pain:  Vitals:   09/29/17 1312  TempSrc:   PainSc: 7       Patients Stated Pain Goal: 5 (57/50/51 8335)  Complications: No apparent anesthesia complications

## 2017-09-29 NOTE — H&P (Signed)
Subjective: Patient is a 57 y.o. right-handed white male who is admitted for treatment of acute left lumbar radiculopathy secondary to a left L2-3 lumbar disc herniation with a fragment of the extends caudally behind the body of L3.  Symptoms began a month ago with pain from the left side of the low back extending to the left groin, anterior left thigh, and around the left knee.  He had difficulty standing and walking, he had difficulty driving because of his severity of the pain.  He has numbness in the anterior left thigh and knee and occasionally into the anterior left leg and into the left foot.  The severity of the pain has required the use of hydrocodone.  He was studied with MRI scan which shows multilevel lumbar degenerative disease and spondylosis with marked to severe stenosis at the L2-3 and L3-4 levels and moderate stenosis at L4-5, but his acute radiculopathy is due to a moderately large left L2-3 lumbar disc patient with the fragment migrating caudally behind the body of L3.  He is admitted now for a left L2-3 lumbar laminotomy and microdiscectomy.   Patient Active Problem List   Diagnosis Date Noted  . Right carotid bruit 06/10/2017  . Paresthesia 06/10/2017  . Left wrist pain 10/09/2016  . Unilateral primary osteoarthritis, right knee 06/24/2016  . S/P total knee replacement using cement, right 06/24/2016  . Chronic pain of right knee 03/27/2016  . HYPERLIPIDEMIA 01/18/2007  . HYPERTENSION 01/18/2007   Past Medical History:  Diagnosis Date  . Allergy   . Arthritis   . GERD (gastroesophageal reflux disease)    occ  . Heart murmur    baby  . High cholesterol   . Hypertension   . Pneumonia   . Right carotid bruit 06/10/2017  . Sciatica   . Seasonal allergies   . Sinus tachycardia   . Spinal stenosis     Past Surgical History:  Procedure Laterality Date  . CARPAL TUNNEL WITH CUBITAL TUNNEL Left 11/10/2013   Procedure: LEFT CARPAL TUNNEL RELEASE;  Surgeon: Cammie Sickle, MD;  Location: [place];  Service: Orthopedics;  Laterality: Left;  . COLONOSCOPY    . KNEE ARTHROSCOPY  [id number]   left and right  . NASAL SINUS SURGERY  05/2015  . TOTAL KNEE ARTHROPLASTY Right 06/24/2016   Procedure: TOTAL KNEE ARTHROPLASTY;  Surgeon: Garald Balding, MD;  Location: [place];  Service: Orthopedics;  Laterality: Right;  . TRIGGER FINGER RELEASE Left 11/10/2013   Procedure: LEFT INDEX A-1 PULLEY RELEASE;  Surgeon: Cammie Sickle, MD;  Location: [place];  Service: Orthopedics;  Laterality: Left;  . ULNAR NERVE TRANSPOSITION Left 11/10/2013   Procedure: ULNAR NERVE TRANSPOSITION;  Surgeon: Cammie Sickle, MD;  Location: [place];  Service: Orthopedics;  Laterality: Left;    No medications prior to admission.   Allergies  Allergen Reactions  . Mushroom Extract Complex Anaphylaxis  . Penicillins Shortness Of Breath    Childhood allergy.  Has patient had a PCN reaction causing immediate rash, facial/tongue/throat swelling, SOB or lightheadedness with hypotension: No Has patient had a PCN reaction causing severe rash involving mucus membranes or skin necrosis: No Has patient had a PCN reaction that required hospitalization No Has patient had a PCN reaction occurring within the last 10 years: No If all of the above answers are "NO", then may proceed with C  . Bystolic [Nebivolol Hcl] Other (See Comments)    Extreme  fatigue, not able to focus  . Codeine Nausea And Vomiting  . Iodine Rash  . Metoprolol Other (See Comments)    Made patient very fatigued, could not focus    Social History   Tobacco Use  . Smoking status: Former Smoker    Packs/day: 1.00    Years: 37.00    Pack years: 37.00    Types: Cigarettes    Last attempt to quit: 05/04/2016    Years since quitting: 1.4  . Smokeless tobacco: Never Used  Substance Use Topics  . Alcohol use: Yes    Alcohol/week: 0.0 oz    Comment: occ    History  reviewed. No pertinent family history.   Review of Systems Pertinent items noted in HPI and remainder of comprehensive ROS otherwise negative.  EXAM: Patient well-developed well-nourished white male in discomfort but no acute distress.   Lungs are clear to auscultation , the patient has symmetrical respiratory excursion. Heart has a regular rate and rhythm normal S1 and S2 no murmur.   Abdomen is soft nontender nondistended bowel sounds are present. Extremity examination shows no clubbing cyanosis or edema.  Straight leg raising is negative bilaterally.  He has a positive femoral stretch maneuver on the left, negative femoral stretch maneuver on the right. Neurologic examination shows left iliopsoas is 4 to 4+/5, right iliopsoas is 5/5.  Left quadriceps is 4+ to 5/5.  Right quadriceps is 5/5.  Dorsiflexor, extensor house longus, and plantar flexor are 5/5 bilaterally.  Sensation is intact to pinprick to the thighs, legs, and feet bilaterally.  Reflex examination of the left quadriceps is absent, the right quadriceps is trace, gastrocnemius are absent bilaterally.  Toes are downgoing bilaterally.  Gait and stance favor the left lower extremity.  Data Review:CBC    Component Value Date/Time   WBC 7.4 06/26/2016 0438   RBC 2.52 (L) 06/26/2016 0438   HGB 8.4 (L) 06/26/2016 0438   HCT 24.1 (L) 06/26/2016 0438   PLT 123 (L) 06/26/2016 0438   MCV 95.6 06/26/2016 0438   MCV 96.9 04/03/2015 1128   MCH 33.3 06/26/2016 0438   MCHC 34.9 06/26/2016 0438   RDW 11.8 06/26/2016 0438   LYMPHSABS 1.6 06/17/2016 0936   MONOABS 0.3 06/17/2016 0936   EOSABS 0.3 06/17/2016 0936   BASOSABS 0.1 06/17/2016 0936                          BMET    Component Value Date/Time   NA 127 (L) 06/26/2016 0438   K 3.8 06/26/2016 0438   CL 91 (L) 06/26/2016 0438   CO2 25 06/26/2016 0438   GLUCOSE 116 (H) 06/26/2016 0438   BUN <5 (L) 06/26/2016 0438   CREATININE 0.62 06/26/2016 0438   CALCIUM 8.5 (L) 06/26/2016 0438    GFRNONAA >60 06/26/2016 0438   GFRAA >60 06/26/2016 8144     Assessment/Plan: Patient with acute left lumbar radiculopathy secondary to a left L2-3 lumbar disc herniation, the fragment is migrated caudally behind the body of L3.  He has significant degenerative changes at the L2-3 L3-4 and L4-5 levels.  He is admitted now for a left L2-3 lumbar laminotomy and microdiscectomy.  I've discussed with the patient the nature of his condition, the nature the surgical procedure, the typical length of surgery, hospital stay, and overall recuperation. We discussed limitations postoperatively. I discussed risks of surgery including risks of infection, bleeding, possibly need for transfusion, the risk of nerve root dysfunction  with pain, weakness, numbness, or paresthesias, or risk of dural tear and CSF leakage and possible need for further surgery, the risk of recurrent disc herniation and the possible need for further surgery, and the risk of anesthetic complications including myocardial infarction, stroke, pneumonia, and death. Understanding all this the patient does wish to proceed with surgery and is admitted for such.    Hosie Spangle, MD 09/29/2017 8:30 AM

## 2017-09-30 ENCOUNTER — Encounter (HOSPITAL_COMMUNITY): Payer: Self-pay | Admitting: Neurosurgery

## 2017-09-30 DIAGNOSIS — M5116 Intervertebral disc disorders with radiculopathy, lumbar region: Secondary | ICD-10-CM | POA: Diagnosis not present

## 2017-09-30 MED ORDER — HYDROCODONE-ACETAMINOPHEN 5-325 MG PO TABS: 0.5000 | ORAL_TABLET | ORAL | 0 refills | Status: DC | PRN

## 2017-09-30 MED FILL — Thrombin For Soln 5000 Unit: CUTANEOUS | Qty: 5000 | Status: AC

## 2017-09-30 MED FILL — Thrombin For Soln 5000 Unit: CUTANEOUS | Qty: 2 | Status: AC

## 2017-09-30 NOTE — Discharge Instructions (Signed)
Wound Care Leave incision open to air. You may shower. Do not scrub directly on incision.  Do not put any creams, lotions, or ointments on incision. Activity Walk each and every day, increasing distance each day. No lifting greater than 5 lbs.  Avoid bending, arching, and twisting. No driving for 2 weeks; may ride as a passenger locally. If provided with back brace, wear when out of bed.  It is not necessary to wear in bed. Diet Resume your normal diet.  Return to Work Will be discussed at you follow up appointment. Call Your Doctor If Any of These Occur Redness, drainage, or swelling at the wound.  Temperature greater than 101 degrees. Severe pain not relieved by pain medication. Incision starts to come apart. Follow Up Appt Call [phone])  for problems.

## 2017-09-30 NOTE — Discharge Summary (Signed)
Physician Discharge Summary  Patient ID: Philip Winters MRN: [id number] DOB/AGE: 09/18/1960 57 y.o.  Admit date: 09/29/2017 Discharge date: 09/30/2017  Admission Diagnoses:  Left L2-3 lumbar disc herniation, left lumbar radiculopathy, lumbar spondylosis, lumbar degenerative disc disease  Discharge Diagnoses:  Left L2-3 lumbar disc herniation, left lumbar radiculopathy, lumbar spondylosis, lumbar degenerative disc disease Active Problems:   HNP (herniated nucleus pulposus), lumbar   Discharged Condition: good  Hospital Course: Patient was admitted, underwent a left L2-3 lumbar laminotomy and microdiscectomy.  Postoperatively he has had excellent relief of his radicular pain.  His dressing was removed, and the incision is healing nicely.  There is no erythema, swelling, or drainage.  He is up and ambulate actively.  He is voiding well.  He is asking to be discharged home.  We are discharging him to home with instructions regarding wound care and activities.  He is scheduled for follow-up with me in the office in 3 weeks.  Discharge Exam: Blood pressure 105/77, pulse (!) 115, temperature 98.2 F (36.8 C), temperature source Oral, resp. rate 18, weight 74.8 kg (165 lb), SpO2 97 %.  Disposition: Discharge disposition: 01-Home or Self Care       Discharge Instructions    Discharge wound care:   Complete by:  As directed    Leave the wound open to air. Shower daily with the wound uncovered. Water and soapy water should run over the incision area. Do not wash directly on the incision for 2 weeks. Remove the glue after 2 weeks.   Driving Restrictions   Complete by:  As directed    No driving for 2 weeks. May ride in the car locally now. May begin to drive locally in 2 weeks.   Other Restrictions   Complete by:  As directed    Walk gradually increasing distances out in the fresh air at least twice a day. Walking additional 6 times inside the house, gradually increasing distances, daily. No  bending, lifting, or twisting. Perform activities between shoulder and waist height (that is at counter height when standing or table height when sitting).     Allergies as of 09/30/2017      Reactions   Mushroom Extract Complex Anaphylaxis   Penicillins Shortness Of Breath   Childhood allergy.  Has patient had a PCN reaction causing immediate rash, facial/tongue/throat swelling, SOB or lightheadedness with hypotension: No Has patient had a PCN reaction causing severe rash involving mucus membranes or skin necrosis: No Has patient had a PCN reaction that required hospitalization No Has patient had a PCN reaction occurring within the last 10 years: No If all of the above answers are "NO", then may proceed with C   Dilaudid [hydromorphone] Nausea And Vomiting   Bystolic [nebivolol Hcl] Other (See Comments)   Extreme fatigue, not able to focus   Codeine Nausea And Vomiting   Iodine Rash   Metoprolol Other (See Comments)   Made patient very fatigued, could not focus      Medication List    STOP taking these medications   clindamycin 300 MG capsule Commonly known as:  CLEOCIN   gabapentin 300 MG capsule Commonly known as:  NEURONTIN   methocarbamol 500 MG tablet Commonly known as:  ROBAXIN   methylPREDNISolone 4 MG Tbpk tablet Commonly known as:  MEDROL DOSEPAK     TAKE these medications   aspirin EC 81 MG tablet Take 81 mg by mouth daily.   diphenhydrAMINE 25 MG tablet Commonly known as:  BENADRYL Take  25 mg by mouth at bedtime as needed for allergies.   HYDROcodone-acetaminophen 5-325 MG tablet Commonly known as:  NORCO/VICODIN Take 1 tablet by mouth 3 (three) times daily as needed for pain. What changed:  Another medication with the same name was added. Make sure you understand how and when to take each.   HYDROcodone-acetaminophen 5-325 MG tablet Commonly known as:  NORCO/VICODIN Take 0.5-1 tablets by mouth every 4 (four) hours as needed (pain). What changed:  You  were already taking a medication with the same name, and this prescription was added. Make sure you understand how and when to take each.   multivitamin with minerals Tabs tablet Take 1 tablet by mouth daily.   naproxen sodium 220 MG tablet Commonly known as:  ALEVE Take 220 mg by mouth 2 (two) times daily as needed (for pain.).   olmesartan 40 MG tablet Commonly known as:  BENICAR Take 40 mg by mouth daily.   tetrahydrozoline 0.05 % ophthalmic solution Place 1 drop into both eyes 3 (three) times daily as needed (for dry/irritated eyes.).            Discharge Care Instructions  (From admission, onward)        Start     Ordered   09/30/17 0000  Discharge wound care:    Comments:  Leave the wound open to air. Shower daily with the wound uncovered. Water and soapy water should run over the incision area. Do not wash directly on the incision for 2 weeks. Remove the glue after 2 weeks.   09/30/17 0656       Signed: Hosie Spangle 09/30/2017, 6:56 AM

## 2017-09-30 NOTE — Progress Notes (Signed)
Patient alert and oriented, mae's well, voiding adequate amount of urine, swallowing without difficulty, no c/o pain at time of discharge. Patient discharged home with family. Script and discharged instructions given to patient. Patient and family stated understanding of instructions given. Patient has an appointment with Dr. Nudelman 

## 2017-10-08 ENCOUNTER — Other Ambulatory Visit: Payer: Self-pay | Admitting: Neurosurgery

## 2017-10-19 ENCOUNTER — Ambulatory Visit (INDEPENDENT_AMBULATORY_CARE_PROVIDER_SITE_OTHER): Payer: 59 | Admitting: Orthopaedic Surgery

## 2017-10-19 ENCOUNTER — Encounter (INDEPENDENT_AMBULATORY_CARE_PROVIDER_SITE_OTHER): Payer: Self-pay | Admitting: Orthopaedic Surgery

## 2017-10-19 VITALS — BP 137/90 | HR 121 | Resp 14 | Ht 68.0 in | Wt 165.0 lb

## 2017-10-19 DIAGNOSIS — M25562 Pain in left knee: Secondary | ICD-10-CM | POA: Diagnosis not present

## 2017-10-19 NOTE — Progress Notes (Signed)
Office Visit Note   Patient: Philip Winters           Date of Birth: 1960/12/23           MRN: [id number] Visit Date: 10/19/2017              Requested by: Philip Contras, MD [address] [place], [place] [address] PCP: Philip Contras, MD   Assessment & Plan: Visit Diagnoses:  1. Acute pain of left knee     Plan: Left knee pain appears to be contusion.  Does have tricompartmental degenerative just predominate the patellofemoral joint.  I think he is just aggravated the pre-existing arthritis.  I think this will slowly resolve over time.  His left lower extremity appears to be weak related to his back.  Has had back surgery earlier in the year.  Suggested he work on strengthening exercises and might want to consider up brace to support the knee.  No obvious acute injury.  Follow-Up Instructions: Return if symptoms worsen or fail to improve.   Orders:  No orders of the defined types were placed in this encounter.  No orders of the defined types were placed in this encounter.     Procedures: No procedures performed   Clinical Data: No additional findings.   Subjective: Chief Complaint  Patient presents with  . Left Knee - Pain  . Knee Pain    Left knee pain, fell 08/30/17 in the middle of the night, knee is cold to the touch, left gave out - twisted, popped, swelling, difficulty walking, gives out, weakness, arthroscopic surgery to left knee, not diabetic  Philip Winters fell about 6 weeks ago landing directly on the anterior aspect of his left knee.  He has a history of osteoarthritis and has had a prior successful right total knee replacement.  He also underwent successful back surgery earlier in the year for stenosis.  He feels like his left leg is weak and possibly causes knee to give way.  He fell about 6 weeks ago landing directly onto the anterior aspect of his knee and is just concerned that he may have had some type of "injury".  No numbness or tingling.  No  swelling of his knee.  Pain seems to be localized on the anterior aspect groin pain.  No obvious swelling HPI  Review of Systems  Constitutional: Positive for activity change.  HENT: Negative for trouble swallowing.   Eyes: Negative for pain.  Respiratory: Negative for shortness of breath.   Cardiovascular: Negative for chest pain.  Gastrointestinal: Negative for constipation.  Endocrine: Negative for cold intolerance.  Genitourinary: Negative for difficulty urinating.  Musculoskeletal: Positive for back pain and joint swelling.  Skin: Negative for rash.  Allergic/Immunologic: Positive for food allergies.  Neurological: Positive for weakness and numbness.  Psychiatric/Behavioral: Positive for sleep disturbance.     Objective: Vital Signs: BP 137/90 (BP Location: Right Arm, Patient Position: Sitting, Cuff Size: Normal)   Pulse (!) 121   Resp 14   Ht 5\' 8"  (1.727 m)   Wt 165 lb (74.8 kg)   BMI 25.09 kg/m   Physical Exam  Constitutional: He is oriented to person, place, and time. He appears well-developed and well-nourished.  HENT:  Mouth/Throat: Oropharynx is clear and moist.  Eyes: Pupils are equal, round, and reactive to light. EOM are normal.  Pulmonary/Chest: Effort normal.  Neurological: He is alert and oriented to person, place, and time.  Skin: Skin is warm and dry.  Psychiatric: He has a normal mood and affect. His behavior is normal.    Ortho Exam awake alert and oriented x3.  Comfortable sitting.  Examination left knee reveals no effusion.  No pain with patella motion.  No pain along the medial lateral joint line.  No opening with varus valgus stress.  Negative anterior drawer sign.  Full extension and flexes over 105 degrees.  No popliteal pain.  No calf pain.  Neurovascular exam intact.  Specialty Comments:  No specialty comments available.  Imaging: No results found.   PMFS History: Patient Active Problem List   Diagnosis Date Noted  . HNP (herniated  nucleus pulposus), lumbar 09/29/2017  . Right carotid bruit 06/10/2017  . Paresthesia 06/10/2017  . Left wrist pain 10/09/2016  . Unilateral primary osteoarthritis, right knee 06/24/2016  . S/P total knee replacement using cement, right 06/24/2016  . Chronic pain of right knee 03/27/2016  . HYPERLIPIDEMIA 01/18/2007  . HYPERTENSION 01/18/2007   Past Medical History:  Diagnosis Date  . Allergy   . Arthritis   . GERD (gastroesophageal reflux disease)    occ  . Heart murmur    baby  . High cholesterol   . Hypertension   . Pneumonia   . Right carotid bruit 06/10/2017  . Sciatica   . Seasonal allergies   . Sinus tachycardia   . Spinal stenosis     History reviewed. No pertinent family history.  Past Surgical History:  Procedure Laterality Date  . CARPAL TUNNEL WITH CUBITAL TUNNEL Left 11/10/2013   Procedure: LEFT CARPAL TUNNEL RELEASE;  Surgeon: Cammie Sickle, MD;  Location: [facility];  Service: Orthopedics;  Laterality: Left;  . COLONOSCOPY    . KNEE ARTHROSCOPY  [id number]   left and right  . LUMBAR LAMINECTOMY/DECOMPRESSION MICRODISCECTOMY Left 09/29/2017   Procedure: LEFT LUMBAR TWO - LUMBAR THREELAMINOTOMY/MICRODISCECTOMY;  Surgeon: Jovita Gamma, MD;  Location: [facility];  Service: Neurosurgery;  Laterality: Left;  LEFT LUMBAR 2- LUMBAR 3 LAMINOTOMY/MICRODISCECTOMY  . NASAL SINUS SURGERY  05/2015  . TOTAL KNEE ARTHROPLASTY Right 06/24/2016   Procedure: TOTAL KNEE ARTHROPLASTY;  Surgeon: Garald Balding, MD;  Location: [facility];  Service: Orthopedics;  Laterality: Right;  . TRIGGER FINGER RELEASE Left 11/10/2013   Procedure: LEFT INDEX A-1 PULLEY RELEASE;  Surgeon: Cammie Sickle, MD;  Location: [facility];  Service: Orthopedics;  Laterality: Left;  . ULNAR NERVE TRANSPOSITION Left 11/10/2013   Procedure: ULNAR NERVE TRANSPOSITION;  Surgeon: Cammie Sickle, MD;  Location: [facility];  Service: Orthopedics;  Laterality: Left;    Social History   Occupational History  . Occupation: Self employed  Tobacco Use  . Smoking status: Former Smoker    Packs/day: 1.00    Years: 37.00    Pack years: 37.00    Types: Cigarettes    Last attempt to quit: 05/04/2016    Years since quitting: 1.4  . Smokeless tobacco: Never Used  Substance and Sexual Activity  . Alcohol use: Yes    Alcohol/week: 0.0 oz    Comment: occ  . Drug use: No  . Sexual activity: Not on file

## 2017-11-02 DIAGNOSIS — J302 Other seasonal allergic rhinitis: Secondary | ICD-10-CM | POA: Diagnosis not present

## 2017-11-02 DIAGNOSIS — I10 Essential (primary) hypertension: Secondary | ICD-10-CM | POA: Diagnosis not present

## 2017-11-02 DIAGNOSIS — E782 Mixed hyperlipidemia: Secondary | ICD-10-CM | POA: Diagnosis not present

## 2017-11-10 DIAGNOSIS — E875 Hyperkalemia: Secondary | ICD-10-CM | POA: Diagnosis not present

## 2017-11-26 IMAGING — US US ABDOMEN COMPLETE
1 series · 14 of 25 positions shown · non-contrast
Comparison: Body CT [DATE]

CLINICAL DATA: Increased GGT levels.

EXAM:
ABDOMEN ULTRASOUND COMPLETE

[Series 1: us abdomen complete · 0.19mm/px · 14 of 92 slices shown]
[im 1/92]
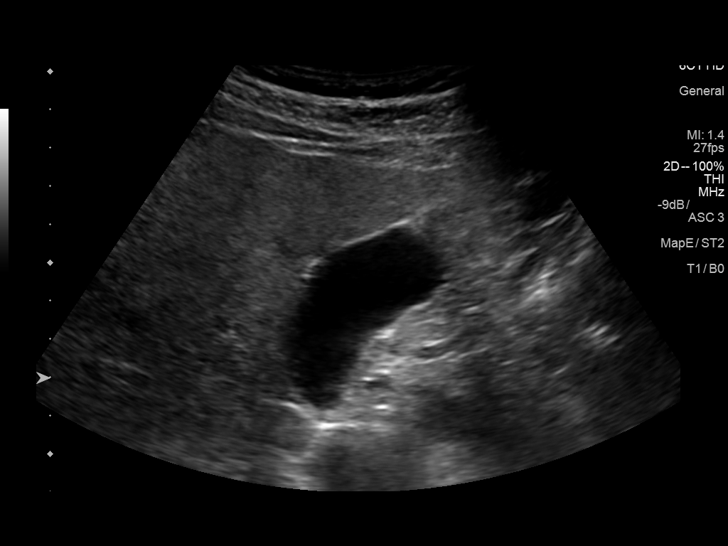
[im 8/92]
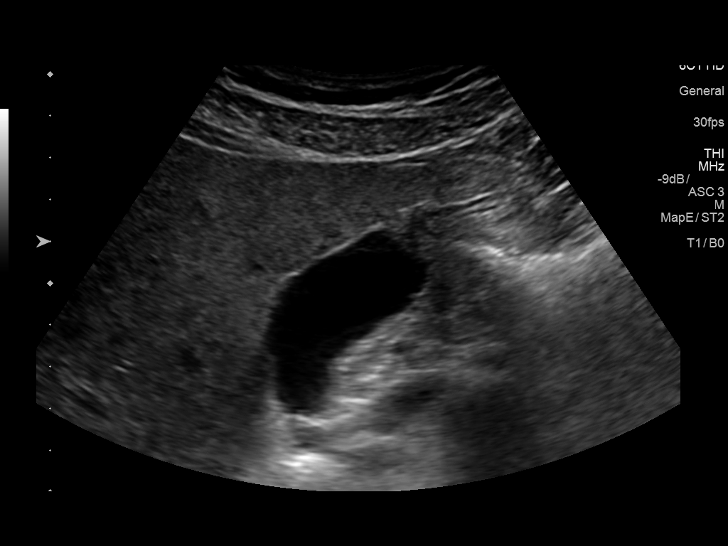
[im 16/92]
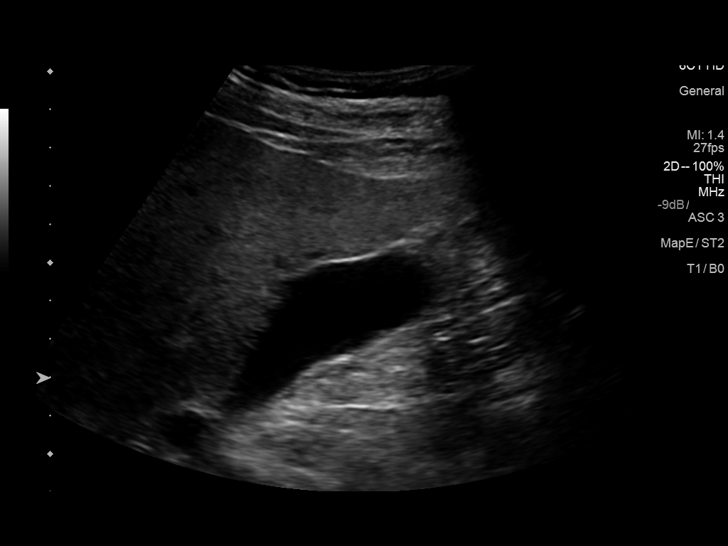
[im 23/92]
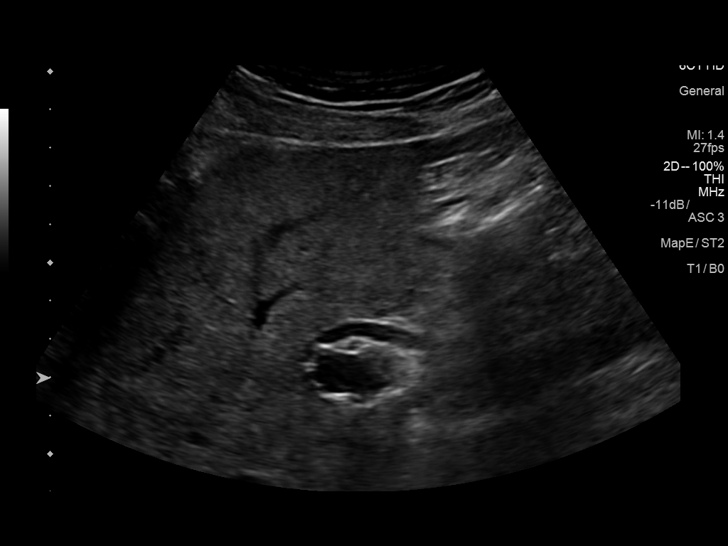
[im 31/92]
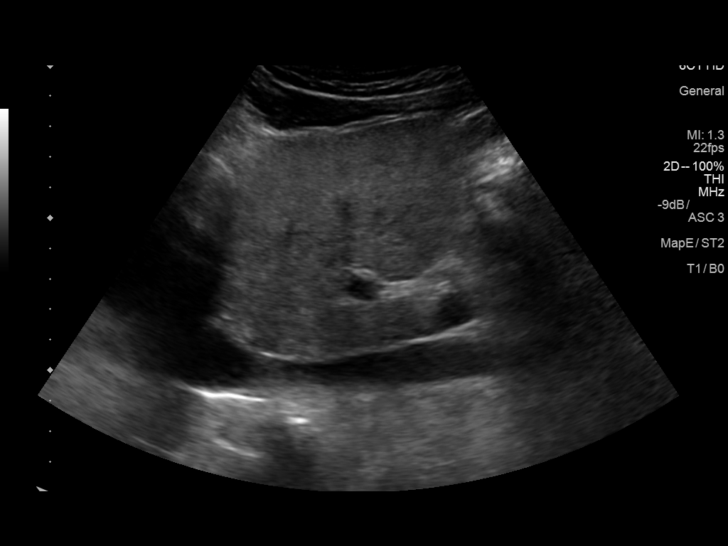
[im 35/92]
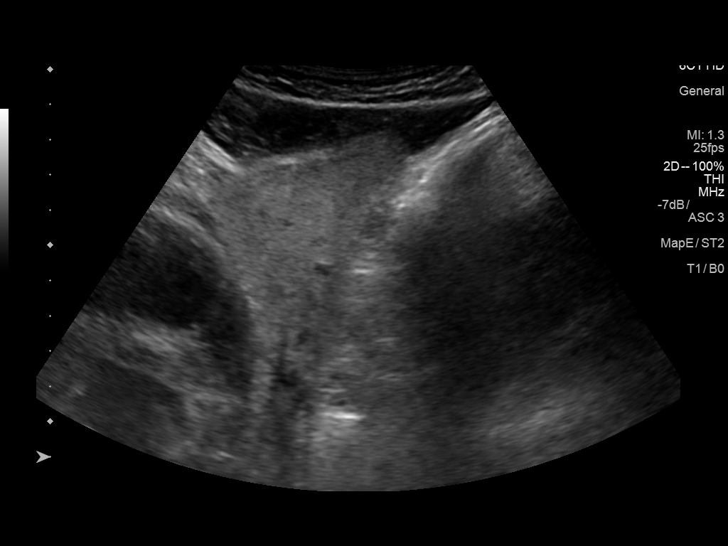
[im 42/92]
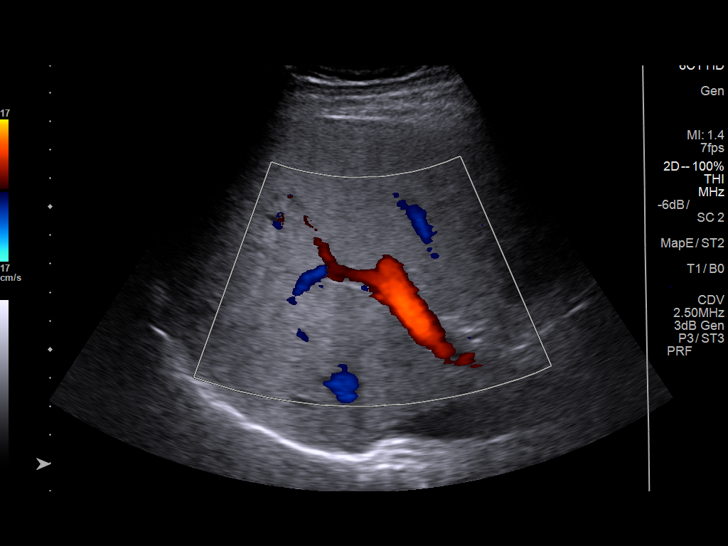
[im 50/92]
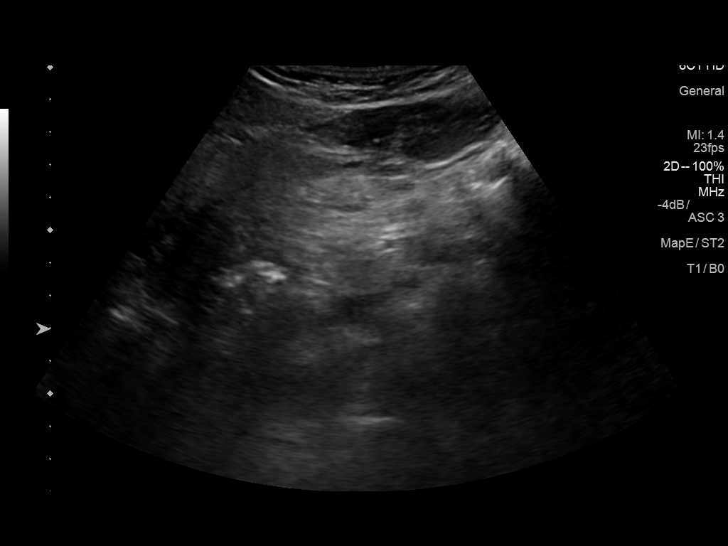
[im 57/92]
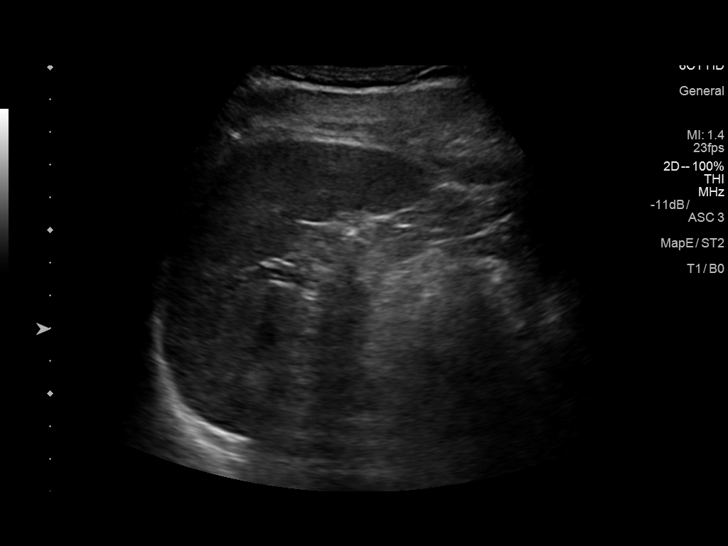
[im 61/92]
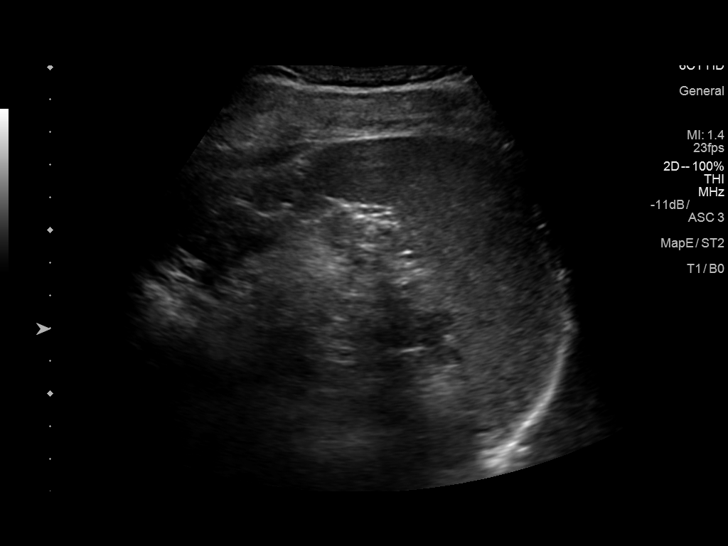
[im 69/92]
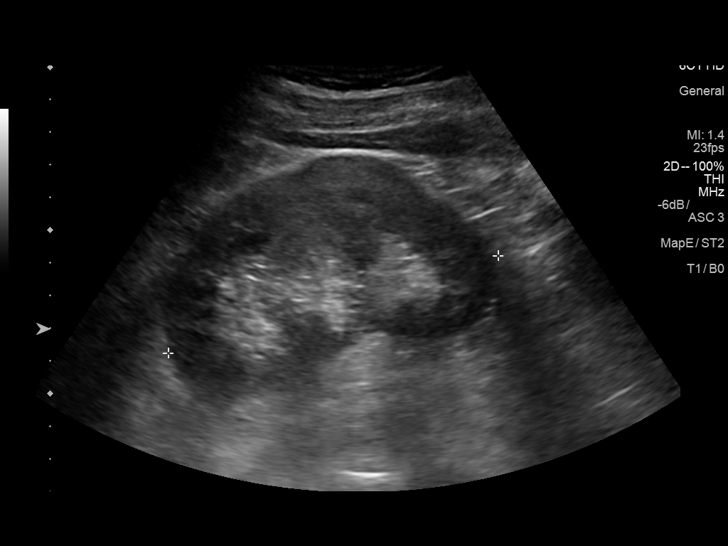
[im 76/92]
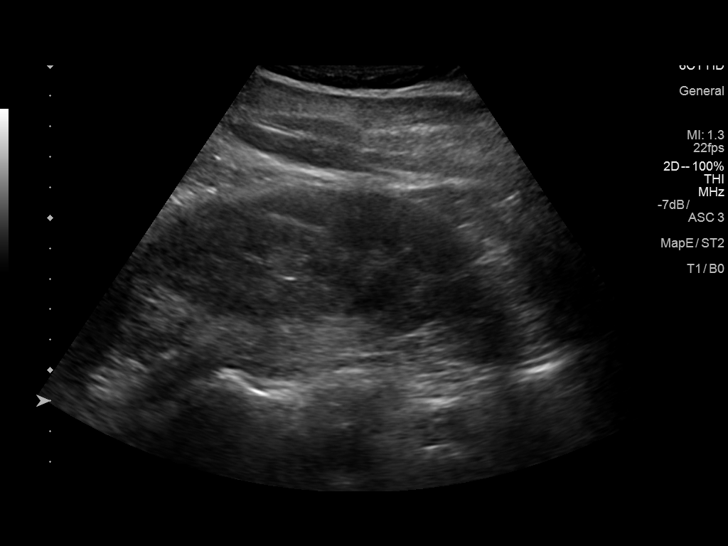
[im 84/92]
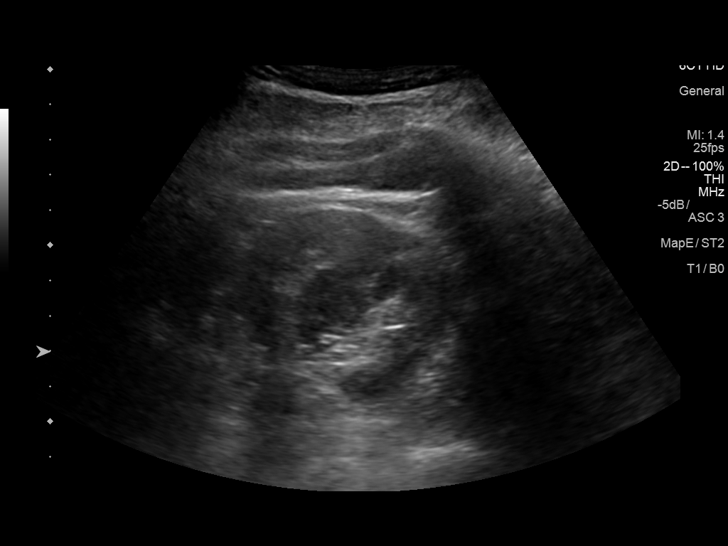
[im 92/92]
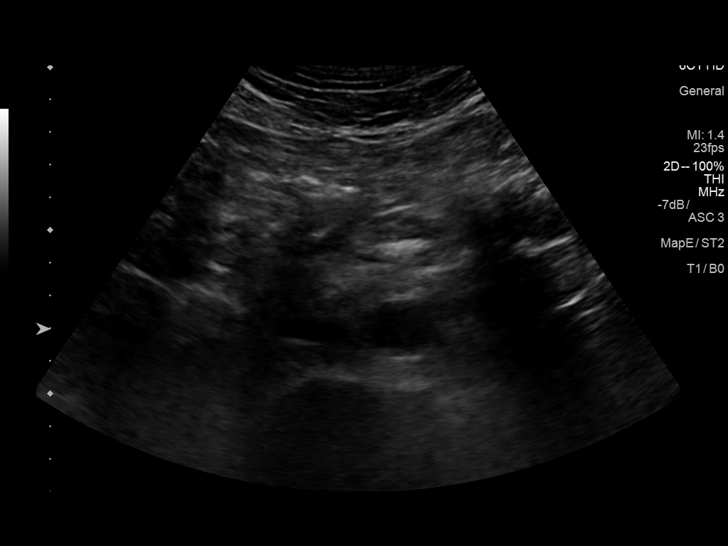

[14 of 25 positions shown; findings below may reference images not displayed]

FINDINGS: Gallbladder: No gallstones or wall thickening visualized. No
sonographic Murphy sign noted by sonographer.

Common bile duct: Diameter: 4.9 mm

Liver: No focal lesion identified. Heterogeneous increased
parenchymal echogenicity. Portal vein is patent on color Doppler
imaging with normal direction of blood flow towards the liver.

IVC: No abnormality visualized.

Pancreas: Visualized portion unremarkable.

Spleen: Size and appearance within normal limits.

Right Kidney: Length: 10.9 cm. Echogenicity within normal limits. No
mass or hydronephrosis visualized.

Left Kidney: Length: 12.3 cm. Echogenicity within normal limits. No
mass or hydronephrosis visualized.

Abdominal aorta: No aneurysm visualized.

Other findings: None.
IMPRESSION: Coarse heterogeneous echotexture of the liver, usually associated
with hepatic steatosis or fibrosis.

## 2018-02-09 DIAGNOSIS — M48062 Spinal stenosis, lumbar region with neurogenic claudication: Secondary | ICD-10-CM | POA: Diagnosis not present

## 2018-02-09 DIAGNOSIS — Z9889 Other specified postprocedural states: Secondary | ICD-10-CM | POA: Diagnosis not present

## 2018-02-09 DIAGNOSIS — M5416 Radiculopathy, lumbar region: Secondary | ICD-10-CM | POA: Diagnosis not present

## 2018-02-09 DIAGNOSIS — M47816 Spondylosis without myelopathy or radiculopathy, lumbar region: Secondary | ICD-10-CM | POA: Diagnosis not present

## 2018-02-23 DIAGNOSIS — Z23 Encounter for immunization: Secondary | ICD-10-CM | POA: Diagnosis not present

## 2018-02-25 DIAGNOSIS — Z9889 Other specified postprocedural states: Secondary | ICD-10-CM | POA: Diagnosis not present

## 2018-02-25 DIAGNOSIS — M5136 Other intervertebral disc degeneration, lumbar region: Secondary | ICD-10-CM | POA: Diagnosis not present

## 2018-02-25 DIAGNOSIS — M5416 Radiculopathy, lumbar region: Secondary | ICD-10-CM | POA: Diagnosis not present

## 2018-03-04 DIAGNOSIS — M48061 Spinal stenosis, lumbar region without neurogenic claudication: Secondary | ICD-10-CM | POA: Diagnosis not present

## 2018-03-04 DIAGNOSIS — M5416 Radiculopathy, lumbar region: Secondary | ICD-10-CM | POA: Diagnosis not present

## 2018-03-04 DIAGNOSIS — M5117 Intervertebral disc disorders with radiculopathy, lumbosacral region: Secondary | ICD-10-CM | POA: Diagnosis not present

## 2018-03-08 DIAGNOSIS — M48062 Spinal stenosis, lumbar region with neurogenic claudication: Secondary | ICD-10-CM | POA: Diagnosis not present

## 2018-03-08 DIAGNOSIS — M5136 Other intervertebral disc degeneration, lumbar region: Secondary | ICD-10-CM | POA: Diagnosis not present

## 2018-03-08 DIAGNOSIS — M4726 Other spondylosis with radiculopathy, lumbar region: Secondary | ICD-10-CM | POA: Diagnosis not present

## 2018-03-19 DIAGNOSIS — H61001 Unspecified perichondritis of right external ear: Secondary | ICD-10-CM | POA: Diagnosis not present

## 2018-03-19 DIAGNOSIS — L814 Other melanin hyperpigmentation: Secondary | ICD-10-CM | POA: Diagnosis not present

## 2018-03-19 DIAGNOSIS — L570 Actinic keratosis: Secondary | ICD-10-CM | POA: Diagnosis not present

## 2018-03-19 DIAGNOSIS — L821 Other seborrheic keratosis: Secondary | ICD-10-CM | POA: Diagnosis not present

## 2018-04-20 DIAGNOSIS — D225 Melanocytic nevi of trunk: Secondary | ICD-10-CM | POA: Diagnosis not present

## 2018-04-20 DIAGNOSIS — D1801 Hemangioma of skin and subcutaneous tissue: Secondary | ICD-10-CM | POA: Diagnosis not present

## 2018-04-20 DIAGNOSIS — L814 Other melanin hyperpigmentation: Secondary | ICD-10-CM | POA: Diagnosis not present

## 2018-05-05 DIAGNOSIS — M5136 Other intervertebral disc degeneration, lumbar region: Secondary | ICD-10-CM | POA: Diagnosis not present

## 2018-05-05 DIAGNOSIS — M48062 Spinal stenosis, lumbar region with neurogenic claudication: Secondary | ICD-10-CM | POA: Diagnosis not present

## 2018-05-05 DIAGNOSIS — M4726 Other spondylosis with radiculopathy, lumbar region: Secondary | ICD-10-CM | POA: Diagnosis not present

## 2018-05-20 ENCOUNTER — Ambulatory Visit (INDEPENDENT_AMBULATORY_CARE_PROVIDER_SITE_OTHER): Payer: 59 | Admitting: Orthopaedic Surgery

## 2018-05-20 ENCOUNTER — Encounter (INDEPENDENT_AMBULATORY_CARE_PROVIDER_SITE_OTHER): Payer: Self-pay | Admitting: Orthopaedic Surgery

## 2018-05-20 ENCOUNTER — Ambulatory Visit (INDEPENDENT_AMBULATORY_CARE_PROVIDER_SITE_OTHER): Payer: Self-pay

## 2018-05-20 VITALS — BP 134/91 | HR 105 | Ht 68.0 in | Wt 167.0 lb

## 2018-05-20 DIAGNOSIS — M25562 Pain in left knee: Secondary | ICD-10-CM

## 2018-05-20 MED ORDER — METHYLPREDNISOLONE ACETATE 40 MG/ML IJ SUSP
80.0000 mg | INTRAMUSCULAR | Status: AC | PRN
  Administered 2018-05-20: 80 mg

## 2018-05-20 MED ORDER — BUPIVACAINE HCL 0.5 % IJ SOLN
2.0000 mL | INTRAMUSCULAR | Status: AC | PRN
  Administered 2018-05-20: 2 mL via INTRA_ARTICULAR

## 2018-05-20 MED ORDER — LIDOCAINE HCL 1 % IJ SOLN
2.0000 mL | INTRAMUSCULAR | Status: AC | PRN
  Administered 2018-05-20: 2 mL

## 2018-05-20 NOTE — Progress Notes (Signed)
xrLt is knee- pt stated is painful and making back hurt.   Office Visit Note   Patient: Philip Winters           Date of Birth: Aug 14, 1960           MRN: [id number] Visit Date: 05/20/2018              Requested by: Antony Contras, MD [address] [place], [place] [address] PCP: Antony Contras, MD   Assessment & Plan: Visit Diagnoses:  1. Acute pain of left knee     Plan: Osteoarthritis left knee with possible CPPD.  Will inject with cortisone and monitor response  Follow-Up Instructions: Return if symptoms worsen or fail to improve.   Orders:  Orders Placed This Encounter  Procedures  . Large Joint Inj: L knee  . XR KNEE 3 VIEW LEFT   No orders of the defined types were placed in this encounter.     Procedures: Large Joint Inj: L knee on 05/20/2018 10:08 AM Indications: pain and diagnostic evaluation Details: 25 G 1.5 in needle, anteromedial approach  Arthrogram: No  Medications: 2 mL lidocaine 1 %; 2 mL bupivacaine 0.5 %; 80 mg methylPREDNISolone acetate 40 MG/ML Procedure, treatment alternatives, risks and benefits explained, specific risks discussed. Consent was given by the patient. Patient was prepped and draped in the usual sterile fashion.       Clinical Data: No additional findings.   Subjective: No chief complaint on file. Philip Winters is nearly 2 years status post primary right total knee replacement and doing well.  He works full-time Scientist, research (physical sciences).  He has developed over the past 8 to 9 months a problem with his left knee with localized medial joint tenderness.  He recently had exacerbation of his pain the office for reevaluation.  He had films performed earlier in the year consistent with some osteoarthritis.  He did have a fall earlier in the year with an exacerbation of ribs pain which slowly resolved he did have a re-exacerbation just recently.  On occasion he has some swelling.  HPI  Review of Systems  Constitutional: Negative.   HENT:  Negative.   Eyes: Negative.   Respiratory: Negative.   Cardiovascular: Negative.   Gastrointestinal: Negative.   Endocrine: Negative.   Genitourinary: Negative.   Musculoskeletal: Positive for gait problem.  Skin: Negative.   Allergic/Immunologic: Negative.   Hematological: Negative.   Psychiatric/Behavioral: Negative.      Objective: Vital Signs: BP (!) 134/91   Pulse (!) 105   Ht 5\' 8"  (1.727 m)   Wt 167 lb (75.8 kg)   BMI 25.39 kg/m   Physical Exam Constitutional:      Appearance: He is well-developed.  Eyes:     Pupils: Pupils are equal, round, and reactive to light.  Pulmonary:     Effort: Pulmonary effort is normal.  Skin:    General: Skin is warm and dry.  Neurological:     Mental Status: He is alert and oriented to person, place, and time.  Psychiatric:        Behavior: Behavior normal.     Ortho Exam awake alert and oriented x3.  Comfortable sitting.  Mild increased varus left knee.  Full extension.  Possibly very small effusion.  Diffuse mild medial joint pain.  No pain laterally.  No patella pain.  No popliteal discomfort or mass.  No calf pain.  Neurologically intact.  Old incision for an open meniscectomy medially without localized tenderness  Specialty Comments:  No specialty comments available.  Imaging: Xr Knee 3 View Left  Result Date: 05/20/2018 Films of the left knee were obtained in several projections standing.  There is some decrease in the medial joint space and mild calcification within the meniscus consistent with CPPD.  No acute changes.  Diffuse calcification of  the femoral, popliteal and tibial arteries.  Films consistent with moderate osteoarthritis    PMFS History: Patient Active Problem List   Diagnosis Date Noted  . HNP (herniated nucleus pulposus), lumbar 09/29/2017  . Right carotid bruit 06/10/2017  . Paresthesia 06/10/2017  . Left wrist pain 10/09/2016  . Unilateral primary osteoarthritis, right knee 06/24/2016  . S/P total  knee replacement using cement, right 06/24/2016  . Acute pain of left knee 03/27/2016  . HYPERLIPIDEMIA 01/18/2007  . HYPERTENSION 01/18/2007   Past Medical History:  Diagnosis Date  . Allergy   . Arthritis   . GERD (gastroesophageal reflux disease)    occ  . Heart murmur    baby  . High cholesterol   . Hypertension   . Pneumonia   . Right carotid bruit 06/10/2017  . Sciatica   . Seasonal allergies   . Sinus tachycardia   . Spinal stenosis     History reviewed. No pertinent family history.  Past Surgical History:  Procedure Laterality Date  . CARPAL TUNNEL WITH CUBITAL TUNNEL Left 11/10/2013   Procedure: LEFT CARPAL TUNNEL RELEASE;  Surgeon: Cammie Sickle, MD;  Location: [facility];  Service: Orthopedics;  Laterality: Left;  . COLONOSCOPY    . KNEE ARTHROSCOPY  [id number]   left and right  . LUMBAR LAMINECTOMY/DECOMPRESSION MICRODISCECTOMY Left 09/29/2017   Procedure: LEFT LUMBAR TWO - LUMBAR THREELAMINOTOMY/MICRODISCECTOMY;  Surgeon: Jovita Gamma, MD;  Location: [facility];  Service: Neurosurgery;  Laterality: Left;  LEFT LUMBAR 2- LUMBAR 3 LAMINOTOMY/MICRODISCECTOMY  . NASAL SINUS SURGERY  05/2015  . TOTAL KNEE ARTHROPLASTY Right 06/24/2016   Procedure: TOTAL KNEE ARTHROPLASTY;  Surgeon: Garald Balding, MD;  Location: [facility];  Service: Orthopedics;  Laterality: Right;  . TRIGGER FINGER RELEASE Left 11/10/2013   Procedure: LEFT INDEX A-1 PULLEY RELEASE;  Surgeon: Cammie Sickle, MD;  Location: [facility];  Service: Orthopedics;  Laterality: Left;  . ULNAR NERVE TRANSPOSITION Left 11/10/2013   Procedure: ULNAR NERVE TRANSPOSITION;  Surgeon: Cammie Sickle, MD;  Location: [facility];  Service: Orthopedics;  Laterality: Left;   Social History   Occupational History  . Occupation: Self employed  Tobacco Use  . Smoking status: Former Smoker    Packs/day: 1.00    Years: 37.00    Pack years: 37.00    Types: Cigarettes     Last attempt to quit: 05/04/2016    Years since quitting: 2.0  . Smokeless tobacco: Never Used  Substance and Sexual Activity  . Alcohol use: Yes    Alcohol/week: 0.0 standard drinks    Comment: occ  . Drug use: No  . Sexual activity: Not on file

## 2018-06-18 DIAGNOSIS — Z125 Encounter for screening for malignant neoplasm of prostate: Secondary | ICD-10-CM | POA: Diagnosis not present

## 2018-06-18 DIAGNOSIS — Z0000 Encounter for general adult medical examination without abnormal findings: Secondary | ICD-10-CM | POA: Diagnosis not present

## 2018-06-18 DIAGNOSIS — E782 Mixed hyperlipidemia: Secondary | ICD-10-CM | POA: Diagnosis not present

## 2018-06-18 DIAGNOSIS — K219 Gastro-esophageal reflux disease without esophagitis: Secondary | ICD-10-CM | POA: Diagnosis not present

## 2018-06-18 DIAGNOSIS — I10 Essential (primary) hypertension: Secondary | ICD-10-CM | POA: Diagnosis not present

## 2018-06-18 DIAGNOSIS — R748 Abnormal levels of other serum enzymes: Secondary | ICD-10-CM | POA: Diagnosis not present

## 2018-06-23 ENCOUNTER — Other Ambulatory Visit: Payer: Self-pay | Admitting: Family Medicine

## 2018-06-23 DIAGNOSIS — R748 Abnormal levels of other serum enzymes: Secondary | ICD-10-CM

## 2018-07-01 ENCOUNTER — Ambulatory Visit
Admission: RE | Admit: 2018-07-01 | Discharge: 2018-07-01 | Disposition: A | Discharge status: Home / Self Care | Payer: 59 | Source: Ambulatory Visit | Attending: Family Medicine | Admitting: Family Medicine

## 2018-07-01 DIAGNOSIS — R748 Abnormal levels of other serum enzymes: Secondary | ICD-10-CM

## 2018-07-01 DIAGNOSIS — K7689 Other specified diseases of liver: Secondary | ICD-10-CM | POA: Diagnosis not present

## 2018-07-20 DIAGNOSIS — R748 Abnormal levels of other serum enzymes: Secondary | ICD-10-CM | POA: Diagnosis not present

## 2018-07-20 DIAGNOSIS — K760 Fatty (change of) liver, not elsewhere classified: Secondary | ICD-10-CM | POA: Diagnosis not present

## 2018-08-31 DIAGNOSIS — M5136 Other intervertebral disc degeneration, lumbar region: Secondary | ICD-10-CM | POA: Diagnosis not present

## 2018-08-31 DIAGNOSIS — Z9889 Other specified postprocedural states: Secondary | ICD-10-CM | POA: Diagnosis not present

## 2018-08-31 DIAGNOSIS — M5416 Radiculopathy, lumbar region: Secondary | ICD-10-CM | POA: Diagnosis not present

## 2018-08-31 DIAGNOSIS — M4726 Other spondylosis with radiculopathy, lumbar region: Secondary | ICD-10-CM | POA: Diagnosis not present

## 2018-09-01 DIAGNOSIS — M48062 Spinal stenosis, lumbar region with neurogenic claudication: Secondary | ICD-10-CM | POA: Diagnosis not present

## 2018-09-02 DIAGNOSIS — M5416 Radiculopathy, lumbar region: Secondary | ICD-10-CM | POA: Diagnosis not present

## 2018-09-06 DIAGNOSIS — M5416 Radiculopathy, lumbar region: Secondary | ICD-10-CM | POA: Diagnosis not present

## 2018-09-06 DIAGNOSIS — M5136 Other intervertebral disc degeneration, lumbar region: Secondary | ICD-10-CM | POA: Diagnosis not present

## 2018-09-06 DIAGNOSIS — M48062 Spinal stenosis, lumbar region with neurogenic claudication: Secondary | ICD-10-CM | POA: Diagnosis not present

## 2018-09-08 DIAGNOSIS — M4726 Other spondylosis with radiculopathy, lumbar region: Secondary | ICD-10-CM | POA: Diagnosis not present

## 2018-09-08 DIAGNOSIS — M48061 Spinal stenosis, lumbar region without neurogenic claudication: Secondary | ICD-10-CM | POA: Diagnosis not present

## 2018-09-08 DIAGNOSIS — M5136 Other intervertebral disc degeneration, lumbar region: Secondary | ICD-10-CM | POA: Diagnosis not present

## 2018-10-04 DIAGNOSIS — M48062 Spinal stenosis, lumbar region with neurogenic claudication: Secondary | ICD-10-CM | POA: Diagnosis not present

## 2018-10-04 DIAGNOSIS — M4726 Other spondylosis with radiculopathy, lumbar region: Secondary | ICD-10-CM | POA: Diagnosis not present

## 2018-10-04 DIAGNOSIS — M5136 Other intervertebral disc degeneration, lumbar region: Secondary | ICD-10-CM | POA: Diagnosis not present

## 2019-02-03 ENCOUNTER — Emergency Department (HOSPITAL_COMMUNITY)
Admission: EM | Admit: 2019-02-03 | Discharge: 2019-02-03 | Disposition: A | Discharge status: Home / Self Care | Payer: 59 | Attending: Emergency Medicine | Admitting: Emergency Medicine

## 2019-02-03 DIAGNOSIS — I471 Supraventricular tachycardia: Secondary | ICD-10-CM | POA: Insufficient documentation

## 2019-02-03 DIAGNOSIS — Z87891 Personal history of nicotine dependence: Secondary | ICD-10-CM | POA: Insufficient documentation

## 2019-02-03 DIAGNOSIS — R000 Tachycardia, unspecified: Secondary | ICD-10-CM

## 2019-02-03 DIAGNOSIS — Z7982 Long term (current) use of aspirin: Secondary | ICD-10-CM | POA: Diagnosis not present

## 2019-02-03 DIAGNOSIS — I10 Essential (primary) hypertension: Secondary | ICD-10-CM | POA: Insufficient documentation

## 2019-02-03 MED ORDER — DILTIAZEM HCL ER COATED BEADS 120 MG PO CP24
120.0000 mg | ORAL_CAPSULE | Freq: Once | ORAL | Status: DC
  Filled 2019-02-03: qty 1

## 2019-02-03 MED ORDER — KETOROLAC TROMETHAMINE 15 MG/ML IJ SOLN
15.0000 mg | Freq: Once | INTRAMUSCULAR | Status: AC
  Administered 2019-02-03: 15 mg via INTRAVENOUS
  Filled 2019-02-03: qty 1

## 2019-02-03 NOTE — ED Notes (Signed)
Patient verbalizes understanding of discharge instructions. Opportunity for questioning and answers were provided. Armband removed by staff, pt discharged from ED.  

## 2019-02-03 NOTE — ED Provider Notes (Signed)
[facility] EMERGENCY DEPARTMENT Provider Note   CSN: [id number] Arrival date & time: 02/03/19  1434     History   Chief Complaint Chief Complaint  Patient presents with  . Tachycardia    HPI Philip Winters is a 58 y.o. male with a lifelong history of tachycardia presenting to the emergency department with asymptomatic tachycardia.  Patient reports he was at the orthopedic office today getting set up for pain control injections into his lumbar spine, but the procedure was aborted when he was noted to have a heart rate of 150 bpm.  He states he was unaware that his heart rate was up, as he was asymptomatic with this.  He states he normally runs between 100 - 110 bpm.  Currently he denies feeling any palpitations, lightheadedness, shortness of breath.  Denies any personal history of atrial fibrillation.   The patient received 200 mcg fentanyl from EMS en route to the ED for his back pain, which is chronic.  Here in the ED, the patient is feeling well. He displays no infectious signs or symptoms.  He reports he has significant chronic lower back pain with herniated discs.  He denies any urinary incontinence or new or worsening weakness in his lower extremities.   Of note, the patient has been seen by cardiology in the past for his tachycardia most recently by Dr. Candee Furbish on 07/13/2017 who noted the patient has trialed several rate controlling agents, but beta blockers left him feeling extremely lethargic and unable to perform his daily functions.  Dr. Marlou Porch offered cardizem to the patient, but the patient reports he never started or took this medication because he feared its side effects.   HPI  Past Medical History:  Diagnosis Date  . Allergy   . Arthritis   . GERD (gastroesophageal reflux disease)    occ  . Heart murmur    baby  . High cholesterol   . Hypertension   . Pneumonia   . Right carotid bruit 06/10/2017  . Sciatica   . Seasonal allergies   . Sinus  tachycardia   . Spinal stenosis     Patient Active Problem List   Diagnosis Date Noted  . HNP (herniated nucleus pulposus), lumbar 09/29/2017  . Right carotid bruit 06/10/2017  . Paresthesia 06/10/2017  . Left wrist pain 10/09/2016  . Unilateral primary osteoarthritis, right knee 06/24/2016  . S/P total knee replacement using cement, right 06/24/2016  . Acute pain of left knee 03/27/2016  . HYPERLIPIDEMIA 01/18/2007  . HYPERTENSION 01/18/2007    Past Surgical History:  Procedure Laterality Date  . CARPAL TUNNEL WITH CUBITAL TUNNEL Left 11/10/2013   Procedure: LEFT CARPAL TUNNEL RELEASE;  Surgeon: Cammie Sickle, MD;  Location: [facility];  Service: Orthopedics;  Laterality: Left;  . COLONOSCOPY    . KNEE ARTHROSCOPY  [id number]   left and right  . LUMBAR LAMINECTOMY/DECOMPRESSION MICRODISCECTOMY Left 09/29/2017   Procedure: LEFT LUMBAR TWO - LUMBAR THREELAMINOTOMY/MICRODISCECTOMY;  Surgeon: Jovita Gamma, MD;  Location: [facility];  Service: Neurosurgery;  Laterality: Left;  LEFT LUMBAR 2- LUMBAR 3 LAMINOTOMY/MICRODISCECTOMY  . NASAL SINUS SURGERY  05/2015  . TOTAL KNEE ARTHROPLASTY Right 06/24/2016   Procedure: TOTAL KNEE ARTHROPLASTY;  Surgeon: Garald Balding, MD;  Location: [facility];  Service: Orthopedics;  Laterality: Right;  . TRIGGER FINGER RELEASE Left 11/10/2013   Procedure: LEFT INDEX A-1 PULLEY RELEASE;  Surgeon: Cammie Sickle, MD;  Location: [facility];  Service: Orthopedics;  Laterality: Left;  . ULNAR NERVE TRANSPOSITION Left 11/10/2013   Procedure: ULNAR NERVE TRANSPOSITION;  Surgeon: Cammie Sickle, MD;  Location: [facility];  Service: Orthopedics;  Laterality: Left;        Home Medications    Prior to Admission medications   Medication Sig Start Date End Date Taking? Authorizing Provider  aspirin EC 81 MG tablet Take 81 mg by mouth daily.    [provider]  diphenhydrAMINE (BENADRYL) 25 MG tablet  Take 25 mg by mouth at bedtime as needed for allergies.    [provider]  fluticasone (FLONASE ALLERGY RELIEF) 50 MCG/ACT nasal spray as needed.    [provider]  HYDROcodone-acetaminophen (NORCO/VICODIN) 5-325 MG tablet Take 1 tablet by mouth 3 (three) times daily as needed for pain. 09/18/17   [provider]  HYDROcodone-acetaminophen (NORCO/VICODIN) 5-325 MG tablet Take 0.5-1 tablets by mouth every 4 (four) hours as needed (pain). 09/30/17   Jovita Gamma, MD  Multiple Vitamin (MULTIVITAMIN WITH MINERALS) TABS tablet Take 1 tablet by mouth daily.    [provider]  naproxen sodium (ANAPROX) 220 MG tablet Take 220 mg by mouth 2 (two) times daily as needed (for pain.).     [provider]  olmesartan (BENICAR) 40 MG tablet Take 40 mg by mouth daily.    [provider]  tetrahydrozoline 0.05 % ophthalmic solution Place 1 drop into both eyes 3 (three) times daily as needed (for dry/irritated eyes.).    [provider]    Family History No family history on file.  Social History Social History   Tobacco Use  . Smoking status: Former Smoker    Packs/day: 1.00    Years: 37.00    Pack years: 37.00    Types: Cigarettes    Quit date: 05/04/2016    Years since quitting: 2.7  . Smokeless tobacco: Never Used  Substance Use Topics  . Alcohol use: Yes    Alcohol/week: 0.0 standard drinks    Comment: occ  . Drug use: No     Allergies   Mushroom extract complex, Penicillins, Dilaudid [hydromorphone], Bystolic [nebivolol hcl], Codeine, Iodine, and Metoprolol   Review of Systems Review of Systems  Constitutional: Negative for chills and fever.  HENT: Negative for ear pain and sore throat.   Eyes: Negative for pain and visual disturbance.  Respiratory: Negative for cough and shortness of breath.   Cardiovascular: Negative for chest pain and palpitations.  Gastrointestinal: Negative for abdominal pain, nausea and vomiting.   Genitourinary: Negative for dysuria and hematuria.  Musculoskeletal: Positive for arthralgias and back pain.  Skin: Negative for color change and rash.  Neurological: Negative for seizures, syncope and light-headedness.  All other systems reviewed and are negative.    Physical Exam Updated Vital Signs BP (!) 126/94   Pulse 94   Temp 98.2 F (36.8 C) (Oral)   Resp 14   Ht 5\' 8"  (1.727 m)   Wt 74.8 kg   SpO2 95%   BMI 25.09 kg/m   Physical Exam Vitals signs and nursing note reviewed.  Constitutional:      Appearance: He is well-developed.  HENT:     Head: Normocephalic and atraumatic.  Eyes:     Conjunctiva/sclera: Conjunctivae normal.  Neck:     Musculoskeletal: Neck supple.  Cardiovascular:     Rate and Rhythm: Regular rhythm. Tachycardia present.     Pulses: Normal pulses.  Pulmonary:     Effort: Pulmonary  effort is normal. No respiratory distress.     Breath sounds: Normal breath sounds.  Abdominal:     Palpations: Abdomen is soft.     Tenderness: There is no abdominal tenderness.  Skin:    General: Skin is warm and dry.  Neurological:     General: No focal deficit present.     Mental Status: He is alert and oriented to person, place, and time.  Psychiatric:        Mood and Affect: Mood normal.        Behavior: Behavior normal.      ED Treatments / Results  Labs (all labs ordered are listed, but only abnormal results are displayed) Labs Reviewed - No data to display  EKG EKG Interpretation  Date/Time:  Thursday February 03 2019 14:51:55 EDT Ventricular Rate:  146 PR Interval:    QRS Duration: 87 QT Interval:  274 QTC Calculation: 427 R Axis:   -2 Text Interpretation:  Sinus tachycardia Atrial premature complexes Abnormal R-wave progression, early transition Borderline repolarization abnormality No STEMI Confirmed by Octaviano Glow [phone]) on 02/03/2019 2:57:07 PM   Radiology No results found.  Procedures Procedures (including critical  care time)  Medications Ordered in ED Medications  ketorolac (TORADOL) 15 MG/ML injection 15 mg (15 mg Intravenous Given 02/03/19 1715)     Initial Impression / Assessment and Plan / ED Course  I have reviewed the triage vital signs and the nursing notes.  Pertinent labs & imaging results that were available during my care of the patient were reviewed by me and considered in my medical decision making (see chart for details).  58 yo male presenting with asymptomatic sinus tachycardia with HR 150 bpm and regular.  He has had chronic SVT his whole life.  He is stable and well appearing on clinical exam.  His HR fluctuates between 100-150 bpm while I am in the room, but it remains regular and does not appear to be atrial fibrillation.    I have a low suspicion for PE or infection based on his history.  I do not believe he needs labwork done.  I believe this was an incidental finding today.  I will reach out to cardiology regarding possible rate-control agents, although the patient states clearly that these have made him feel awful and lethargic in the past.  Clinical Course as of Feb 03 1939  Thu Feb 03, 2019  1539 On arrival in the ED patient had a heart rate of approximately 150 as noted by the EKG.  However when I assessed him in the room now his heart rate initially was 100 and to 110 bpm and regular, and jumping back to 150 bpm at the end of her conversation.  His blood pressures were stable throughout and he developed no symptoms throughout this.  The rate has been regular, does not appear to be A. Fib or flutter to me.   [MT]  1720 I discussed the case with Dr. Marisue Ivan of cardiology who agreed the EKG demonstrates sinus tachycardia and appears to be a regular rhythm.  Dr. Marisue Ivan suggested a trial of 120 mg cardizem with observation in the ED, which I suggested to the patient.  Initially Mr Strasburger agreed to this, but after I left the room he changed him mind, and is now refusing Cardizem. He  is concerned this will drop his blood pressure too low.  He would rather take his chances as an outpatient and see a cardiologist, as he states he has  been living like this for "years" and doesn't want to experiment with new medications.  I believe this is a reasonable option given his unique circumstances.  I believe it is reasonable for him to follow-up with a cardiologist as an outpatient.     [MT]    Clinical Course User Index [MT] Tionna Gigante, Carola Rhine, MD       Final Clinical Impressions(s) / ED Diagnoses   Final diagnoses:  SVT (supraventricular tachycardia) ([facility])  Sinus tachycardia    ED Discharge Orders    None       Wyvonnia Dusky, MD 02/03/19 1941

## 2019-02-03 NOTE — ED Triage Notes (Signed)
Patient was scheduled injection at pain clinic today for spinal stenosis and his heat rate was in 160s, they didn't do injection and called ems for transport

## 2019-02-03 NOTE — Discharge Instructions (Addendum)
You were seen in the emergency department today with a fast heart rate.  You otherwise were well-appearing and had no other symptoms.  We discussed this with her cardiologist on-call, who recommended that we try to give you 1 dose of Cardizem in the ER.  However you decided you did not want to try this medication, as you were concerned it might drop your blood pressure.  We offered you a period of observation in the ER but you decided not wanted to this.  Therefore we are discharging you to home with follow-up with cardiology as an outpatient.  We gave the number for Dr. Davina Poke, however you can follow-up with your own cardiologist if you prefer.  If you begin to feel lightheaded, short of breath, feel like passing out, come back to the ER immediately.  Please continue to check your heart rate at home and record it periodically.

## 2019-02-03 NOTE — ED Notes (Signed)
Ems gave patient 210mcg fentanyl total for back pain  Of 10/10, now pain rated at 8/10.  Patient has a history of tachycardia and has seen cardiology in past.

## 2019-02-03 NOTE — ED Notes (Signed)
Hook patient up to the monitor did ekg shown to er doctor patient is resting with call bell in reach and family and nurse at bedside

## 2019-02-04 NOTE — Progress Notes (Signed)
Cardiology Office Note   Date:  02/07/2019   ID:  Philip Winters, DOB 1961/05/16, MRN [id number]  PCP:  Antony Contras, MD  Cardiologist: Dr. Candee Furbish, MD  Chief Complaint  Patient presents with  . Tachycardia    History of Present Illness: Philip Winters is a 58 y.o. male who presents for follow-up of SVT, seen for Dr. Marlou Porch.  [name] has a history of hypertension, prior tobacco use and tachycardia.  He was last seen by Dr. Marlou Porch on 07/13/2017.   He was initially referred to Dr. Marlou Porch by his PCP for uncontrolled hypertension.  They had tried beta-blockers in the past which were not tolerated well and therefore was referred to cardiology for assistance.  Also noted to have some issues with tachycardia.  Patient self-reported his heart rate has always been in the low 100s for most of his life.  An echocardiogram was performed to ensure proper structure and function of his heart out of concern for tachycardia mediated cardiomyopathy.  Diltiazem 120 mg CD was suggested.  Echocardiogram from 07/23/2017 with normal LVEF at 60% with bicuspid or functionally bicuspid aortic valve thickening, mild aortic stenosis and mild aortic regurgitation with an ascending aorta on the upper side of normal at 39 mm.  Plan was to repeat echocardiogram in 3 to 5 years and follow-up yearly from thereon out.  He was then seen in the emergency department on 02/03/2019 with asymptomatic tachycardia.  Patient reported that he was seen at an orthopedic office earlier in the day for set up for pain control injections into his lumbar spine however the procedure was aborted when he was noted to have an heart rate of 150 bpm.  Patient was completely asymptomatic and reported that he normally runs between 101-110 bpm at baseline.  He had no palpitations, lightheadedness, shortness of breath or chest pain.  As stated above, it was previously recommended to start diltiazem 120 mg CD for his tachycardia however patient reports  never starting this medication.  EKG was performed which demonstrated sinus tachycardia. Apparently he once again refused to start this medication while in the ED.  He was concerned that his BP would be too low.  He presents today for ED follow-up of sinus tachycardia.  Patient denies chest pain, palpitations, shortness of breath, dyspnea, LE swelling, dizziness or syncope.  He is now willing to try diltiazem CD 120 mg given his tachycardia.  He was previously reluctant as above to try any new medications given his experience with beta-blockers.  He was profoundly fatigued while on beta-blockers and reports he was nervous to start a new medication.  We will try this for several weeks.  He is to go to the drugstore to obtain a more accurate BP cuff which will help him determine HR.  We also discussed proper technique for checking his pulse on his wrist.        Past Medical History:  Diagnosis Date  . Allergy   . Arthritis   . GERD (gastroesophageal reflux disease)    occ  . Heart murmur    baby  . High cholesterol   . Hypertension   . Pneumonia   . Right carotid bruit 06/10/2017  . Sciatica   . Seasonal allergies   . Sinus tachycardia   . Spinal stenosis     Past Surgical History:  Procedure Laterality Date  . CARPAL TUNNEL WITH CUBITAL TUNNEL Left 11/10/2013   Procedure: LEFT CARPAL TUNNEL RELEASE;  Surgeon:  Cammie Sickle, MD;  Location: [facility];  Service: Orthopedics;  Laterality: Left;  . COLONOSCOPY    . KNEE ARTHROSCOPY  [id number]   left and right  . LUMBAR LAMINECTOMY/DECOMPRESSION MICRODISCECTOMY Left 09/29/2017   Procedure: LEFT LUMBAR TWO - LUMBAR THREELAMINOTOMY/MICRODISCECTOMY;  Surgeon: Jovita Gamma, MD;  Location: [facility];  Service: Neurosurgery;  Laterality: Left;  LEFT LUMBAR 2- LUMBAR 3 LAMINOTOMY/MICRODISCECTOMY  . NASAL SINUS SURGERY  05/2015  . TOTAL KNEE ARTHROPLASTY Right 06/24/2016   Procedure: TOTAL KNEE ARTHROPLASTY;  Surgeon: Garald Balding, MD;  Location: [facility];  Service: Orthopedics;  Laterality: Right;  . TRIGGER FINGER RELEASE Left 11/10/2013   Procedure: LEFT INDEX A-1 PULLEY RELEASE;  Surgeon: Cammie Sickle, MD;  Location: [facility];  Service: Orthopedics;  Laterality: Left;  . ULNAR NERVE TRANSPOSITION Left 11/10/2013   Procedure: ULNAR NERVE TRANSPOSITION;  Surgeon: Cammie Sickle, MD;  Location: [facility];  Service: Orthopedics;  Laterality: Left;     Current Outpatient Medications  Medication Sig Dispense Refill  . aspirin EC 81 MG tablet Take 81 mg by mouth daily.    . diphenhydrAMINE (BENADRYL) 25 MG tablet Take 25 mg by mouth at bedtime as needed for allergies.    . fluticasone (FLONASE ALLERGY RELIEF) 50 MCG/ACT nasal spray as needed.    . Multiple Vitamin (MULTIVITAMIN WITH MINERALS) TABS tablet Take 1 tablet by mouth daily.    . naproxen sodium (ANAPROX) 220 MG tablet Take 220 mg by mouth 2 (two) times daily as needed (for pain.).     Marland Kitchen olmesartan (BENICAR) 40 MG tablet Take 40 mg by mouth daily.    Marland Kitchen tetrahydrozoline 0.05 % ophthalmic solution Place 1 drop into both eyes 3 (three) times daily as needed (for dry/irritated eyes.).    Marland Kitchen traMADol (ULTRAM) 50 MG tablet TK 1 T PO  Q 6 HOURS PRN    . diltiazem (CARDIZEM CD) 120 MG 24 hr capsule Take 1 capsule (120 mg total) by mouth daily. 90 capsule 3   No current facility-administered medications for this visit.     Allergies:   Mushroom extract complex, Penicillins, Dilaudid [hydromorphone], Bystolic [nebivolol hcl], Codeine, Iodine, and Metoprolol    Social History:  The patient  reports that he quit smoking about 2 years ago. His smoking use included cigarettes. He has a 37.00 pack-year smoking history. He has never used smokeless tobacco. He reports current alcohol use. He reports that he does not use drugs.   Family History:  The patient's family history is not on file.    ROS:  Please see the history of  present illness.  Otherwise, review of systems are positive for none.   All other systems are reviewed and negative.    PHYSICAL EXAM: VS:  BP 120/78   Pulse (!) 107   Ht 5\' 8"  (1.727 m)   Wt 163 lb (73.9 kg)   SpO2 97%   BMI 24.78 kg/m  , BMI Body mass index is 24.78 kg/m.   General: Well developed, well nourished, NAD Neck: Negative for carotid bruits. No JVD Lungs:Clear to ausculation bilaterally. No wheezes, rales, or rhonchi. Breathing is unlabored. Cardiovascular: RRR with S1 S2. No murmur Extremities: No edema. No clubbing or cyanosis. DP pulses 2+ bilaterally Neuro: Alert and oriented. No focal deficits. No facial asymmetry. MAE spontaneously. Psych: Responds to questions appropriately with normal affect.     EKG:  EKG is not ordered today.  Recent Labs: No results found for requested labs within last 8760 hours.    Lipid Panel No results found for: CHOL, TRIG, HDL, CHOLHDL, VLDL, LDLCALC, LDLDIRECT    Wt Readings from Last 3 Encounters:  02/07/19 163 lb (73.9 kg)  02/03/19 165 lb (74.8 kg)  05/20/18 167 lb (75.8 kg)      Other studies Reviewed: Additional studies/ records that were reviewed today include:   Echocardiogram 07/23/2017: Study Conclusions  - Left ventricle: The cavity size was normal. There was mild   concentric hypertrophy. Systolic function was normal. The   estimated ejection fraction was in the range of 60% to 65%. Wall   motion was normal; there were no regional wall motion   abnormalities. Doppler parameters are consistent with abnormal   left ventricular relaxation (grade 1 diastolic dysfunction).   There was no evidence of elevated ventricular filling pressure by   Doppler parameters. - Aortic valve: Bicuspid; moderately thickened, moderately   calcified leaflets. Valve mobility was restricted. There was mild   stenosis. There was mild regurgitation. - Mitral valve: There was mild regurgitation. - Right ventricle: The cavity size  was normal. Wall thickness was   normal. Systolic function was normal. - Tricuspid valve: There was mild regurgitation. - Pulmonary arteries: Systolic pressure was within the normal   range. - Inferior vena cava: The vessel was normal in size. - Pericardium, extracardiac: There was no pericardial effusion.  Impressions:  - Bicuspid or functionally bicuspid aortic valve with thickened   leaflets. Mild aortic stenosis and mild aortic regurgitation.   Ascending aorta has upper normal size measuring 39 mm.  Carotid Doppler ultrasound 06/16/2017: Right Carotid: Velocities in the right ICA are consistent with a 1-39% stenosis.  Left Carotid: Velocities in the left ICA are consistent with a 1-39% stenosis.  Vertebrals: Both vertebral arteries were patent with antegrade flow.   ASSESSMENT AND PLAN:  1.  Sinus tachycardia: -We will initiate diltiazem 120 mg CD and follow-up in 2 weeks.  Discussed obtaining proper BP cuff and proper pulse monitoring in which he reports understanding -HR today 107 which appears to be at his baseline -States he had lab work performed by his PCP this month.  We will have their office forward this information  2.  Essential hypertension: -Stable, 120/78 -Continue Benicar 40  3.  Aortic atherosclerosis: -Bilateral carotid Dopplers performed with 1 to 39% bilaterally.  Previously suggested to start statin therapy however patient noted that he was intolerant.  It was discussed to trial Crestor 5 mg once a week then increasing perhaps to 2-3 times per week if patient tolerated.  He has CV risk factors including history of tobacco use.   Current medicines are reviewed at length with the patient today.  The patient does not have concerns regarding medicines.  The following changes have been made: Add diltiazem CD 120 mg daily  Labs/ tests ordered today include: none  No orders of the defined types were placed in this encounter.   Disposition:   FU with  APP or MD in 2 weeks  Signed, Kathyrn Drown, NP  02/07/2019 [facility] [address], [place], [place]  [address] Phone: [phone]; Fax: [phone]

## 2019-02-07 ENCOUNTER — Encounter: Payer: Self-pay | Admitting: Cardiology

## 2019-02-07 ENCOUNTER — Other Ambulatory Visit: Payer: Self-pay

## 2019-02-07 ENCOUNTER — Ambulatory Visit (INDEPENDENT_AMBULATORY_CARE_PROVIDER_SITE_OTHER): Payer: 59 | Admitting: Cardiology

## 2019-02-07 VITALS — BP 120/78 | HR 107 | Ht 68.0 in | Wt 163.0 lb

## 2019-02-07 DIAGNOSIS — I700 Atherosclerosis of aorta: Secondary | ICD-10-CM | POA: Diagnosis not present

## 2019-02-07 DIAGNOSIS — R000 Tachycardia, unspecified: Secondary | ICD-10-CM | POA: Diagnosis not present

## 2019-02-07 DIAGNOSIS — I10 Essential (primary) hypertension: Secondary | ICD-10-CM | POA: Diagnosis not present

## 2019-02-07 MED ORDER — DILTIAZEM HCL ER COATED BEADS 120 MG PO CP24: 120.0000 mg | ORAL_CAPSULE | Freq: Every day | ORAL | 3 refills | Status: DC

## 2019-02-07 NOTE — Patient Instructions (Signed)
Medication Instructions:  Your physician has recommended you make the following change in your medication:  1. START DILTIAZEM CD 120 MG DAILY.   If you need a refill on your cardiac medications before your next appointment, please call your pharmacy.   Lab work: NONE If you have labs (blood work) drawn today and your tests are completely normal, you will receive your results only by: Marland Kitchen MyChart Message (if you have MyChart) OR . A paper copy in the mail If you have any lab test that is abnormal or we need to change your treatment, we will call you to review the results.  Testing/Procedures: NONE  Follow-Up: At [facility], you and your health needs are our priority.  As part of our continuing mission to provide you with exceptional heart care, we have created designated Provider Care Teams.  These Care Teams include your primary Cardiologist (physician) and Advanced Practice Providers (APPs -  Physician Assistants and Nurse Practitioners) who all work together to provide you with the care you need, when you need it. You will need a follow up appointment in 2 weeks.  You may see DR. SKAINS or one of the following Advanced Practice Providers on your designated Care Team:   Truitt Merle, NP Cecilie Kicks, NP . Kathyrn Drown, NP  Any Other Special Instructions Will Be Listed Below (If Applicable).  PLEASE KEEP A LOG OF BLOOD PRESSURE/ HEART RATE AND BRING IT WITH YOU TO YOUR NEXT APPOINTMENT.

## 2019-02-18 NOTE — Progress Notes (Signed)
CARDIOLOGY OFFICE NOTE  Date:  02/23/2019    [name] Date of Birth: [birth year] Medical Record [id number]  PCP:  Antony Contras, MD  Cardiologist:  [facility]   Chief Complaint  Patient presents with  . Follow-up    History of Present Illness: Philip Winters is a 58 y.o. male who presents today for a 2 week check. Seen for Dr. Marlou Porch.   He has a history of hypertension, prior tobacco use and tachycardia.  He was initially referred to Dr. Marlou Porch by his PCP for uncontrolled hypertension.  They had tried beta-blockers in the past which were not tolerated well and therefore was referred to cardiology for assistance.  Also noted to have some issues with tachycardia.  Patient self-reported his heart rate has always been in the low 100s for most of his life.  An echocardiogram was performed to ensure proper structure and function of his heart out of concern for tachycardia mediated cardiomyopathy.  Diltiazem 120 mg CD was suggested.  Echocardiogram from 07/23/2017 with normal LVEF at 60% with bicuspid or functionally bicuspid aortic valve thickening, mild aortic stenosis and mild aortic regurgitation with an ascending aorta on the upper side of normal at 39 mm.  Plan was to repeat echocardiogram in 3 to 5 years and follow-up yearly from thereon out.  He was then seen in the emergency department on 02/03/2019 with asymptomatic tachycardia.  Patient reported that he was seen at an orthopedic office earlier in the day for set up for pain control injections into his lumbar spine however the procedure was aborted when he was noted to have an heart rate of 150 bpm.  Patient was completely asymptomatic and reported that he normally runs between 101-110 bpm at baseline.  He had no palpitations, lightheadedness, shortness of breath or chest pain.  Seen here about 2 weeks ago by Kathyrn Drown, NP for follow up. Found that the patient had never started the low dose Diltiazem. He had refused this in the ED as  well. He was concerned that his BP would be too low. He was previously reluctant as above to try any new medications given his experience with beta-blockers.   The patient does not have symptoms concerning for COVID-19 infection (fever, chills, cough, or new shortness of breath).   Comes in today. Here alone. He is watching "[place] [employer]" off his phone. Has lots of back pain - has not been able to get his injection rescheduled - needs a note from [name] to Dr. Sherwood Gambler. He notes his HR has come down some. He feels fine. He brought me a list of his BP and HR's - basically look ok - baseline HR back to around 110.  He feels like his back pain is driving the elevation in his heart rate. He feels fine on this - nothing like how he felt on beta blocker. BP is fine. He does not wish to take anything for his lipids - he has had statin intolerance.   Past Medical History:  Diagnosis Date  . Allergy   . Arthritis   . GERD (gastroesophageal reflux disease)    occ  . Heart murmur    baby  . High cholesterol   . Hypertension   . Pneumonia   . Right carotid bruit 06/10/2017  . Sciatica   . Seasonal allergies   . Sinus tachycardia   . Spinal stenosis     Past Surgical History:  Procedure Laterality Date  . CARPAL TUNNEL WITH  CUBITAL TUNNEL Left 11/10/2013   Procedure: LEFT CARPAL TUNNEL RELEASE;  Surgeon: Cammie Sickle, MD;  Location: [facility];  Service: Orthopedics;  Laterality: Left;  . COLONOSCOPY    . KNEE ARTHROSCOPY  [id number]   left and right  . LUMBAR LAMINECTOMY/DECOMPRESSION MICRODISCECTOMY Left 09/29/2017   Procedure: LEFT LUMBAR TWO - LUMBAR THREELAMINOTOMY/MICRODISCECTOMY;  Surgeon: Jovita Gamma, MD;  Location: [facility];  Service: Neurosurgery;  Laterality: Left;  LEFT LUMBAR 2- LUMBAR 3 LAMINOTOMY/MICRODISCECTOMY  . NASAL SINUS SURGERY  05/2015  . TOTAL KNEE ARTHROPLASTY Right 06/24/2016   Procedure: TOTAL KNEE ARTHROPLASTY;  Surgeon: Garald Balding, MD;  Location:  [facility];  Service: Orthopedics;  Laterality: Right;  . TRIGGER FINGER RELEASE Left 11/10/2013   Procedure: LEFT INDEX A-1 PULLEY RELEASE;  Surgeon: Cammie Sickle, MD;  Location: [facility];  Service: Orthopedics;  Laterality: Left;  . ULNAR NERVE TRANSPOSITION Left 11/10/2013   Procedure: ULNAR NERVE TRANSPOSITION;  Surgeon: Cammie Sickle, MD;  Location: [facility];  Service: Orthopedics;  Laterality: Left;     Medications: Current Meds  Medication Sig  . aspirin EC 81 MG tablet Take 81 mg by mouth daily.  Marland Kitchen diltiazem (CARDIZEM CD) 120 MG 24 hr capsule Take 1 capsule (120 mg total) by mouth daily.  . diphenhydrAMINE (BENADRYL) 25 MG tablet Take 25 mg by mouth at bedtime as needed for allergies.  . fluticasone (FLONASE ALLERGY RELIEF) 50 MCG/ACT nasal spray as needed.  . Multiple Vitamin (MULTIVITAMIN WITH MINERALS) TABS tablet Take 1 tablet by mouth daily.  . naproxen sodium (ANAPROX) 220 MG tablet Take 220 mg by mouth 2 (two) times daily as needed (for pain.).   Marland Kitchen olmesartan (BENICAR) 40 MG tablet Take 40 mg by mouth daily.  Marland Kitchen tetrahydrozoline 0.05 % ophthalmic solution Place 1 drop into both eyes 3 (three) times daily as needed (for dry/irritated eyes.).  Marland Kitchen traMADol (ULTRAM) 50 MG tablet TK 1 T PO  Q 6 HOURS PRN     Allergies: Allergies  Allergen Reactions  . Mushroom Extract Complex Anaphylaxis  . Penicillins Shortness Of Breath    Childhood allergy.  Has patient had a PCN reaction causing immediate rash, facial/tongue/throat swelling, SOB or lightheadedness with hypotension: No Has patient had a PCN reaction causing severe rash involving mucus membranes or skin necrosis: No Has patient had a PCN reaction that required hospitalization No Has patient had a PCN reaction occurring within the last 10 years: No If all of the above answers are "NO", then may proceed with C  . Dilaudid [Hydromorphone] Nausea And Vomiting  . Bystolic [Nebivolol Hcl]  Other (See Comments)    Extreme fatigue, not able to focus  . Codeine Nausea And Vomiting  . Iodine Rash  . Metoprolol Other (See Comments)    Made patient very fatigued, could not focus    Social History: The patient  reports that he quit smoking about 2 years ago. His smoking use included cigarettes. He has a 37.00 pack-year smoking history. He has never used smokeless tobacco. He reports current alcohol use. He reports that he does not use drugs.   Family History: The patient's family history is not on file.  Father died with mesothelioma. Mother died with cancer.   Review of Systems: Please see the history of present illness.   All other systems are reviewed and negative.   Physical Exam: VS:  BP 134/80   Pulse (!) 111   Ht  5\' 8"  (1.727 m)   Wt 161 lb 1.9 oz (73.1 kg)   SpO2 97%   BMI 24.50 kg/m  .  BMI Body mass index is 24.5 kg/m.  Wt Readings from Last 3 Encounters:  02/23/19 161 lb 1.9 oz (73.1 kg)  02/07/19 163 lb (73.9 kg)  02/03/19 165 lb (74.8 kg)    General: Pleasant. Rather talkative. Alert and in no acute distress.  HEENT: Normal.  Neck: Supple, no JVD, carotid bruits, or masses noted.  Cardiac: Regular rate and rhythm. His rate is a little fast but ok. No edema.  Respiratory:  Lungs are clear to auscultation bilaterally with normal work of breathing.  GI: Soft and nontender.  MS: No deformity or atrophy. Gait and ROM intact.  Skin: Warm and dry. Color is normal.  Neuro:  Strength and sensation are intact and no gross focal deficits noted.  Psych: Alert, appropriate and with normal affect.   LABORATORY DATA:  EKG:  EKG is ordered today. This shows sinus tachycardia.   Lab Results  Component Value Date   WBC 5.0 09/29/2017   HGB 15.0 09/29/2017   HCT 42.7 09/29/2017   PLT 216 09/29/2017   GLUCOSE 111 (H) 09/29/2017   ALT 43 06/17/2016   AST 44 (H) 06/17/2016   NA 138 09/29/2017   K 4.3 09/29/2017   CL 104 09/29/2017   CREATININE 0.70  09/29/2017   BUN 12 09/29/2017   CO2 22 09/29/2017   INR 0.93 06/17/2016       BNP (last 3 results) No results for input(s): BNP in the last 8760 hours.  ProBNP (last 3 results) No results for input(s): PROBNP in the last 8760 hours.   Other Studies Reviewed Today:  Echocardiogram 07/23/2017: Study Conclusions  - Left ventricle: The cavity size was normal. There was mild concentric hypertrophy. Systolic function was normal. The estimated ejection fraction was in the range of 60% to 65%. Wall motion was normal; there were no regional wall motion abnormalities. Doppler parameters are consistent with abnormal left ventricular relaxation (grade 1 diastolic dysfunction). There was no evidence of elevated ventricular filling pressure by Doppler parameters. - Aortic valve: Bicuspid; moderately thickened, moderately calcified leaflets. Valve mobility was restricted. There was mild stenosis. There was mild regurgitation. - Mitral valve: There was mild regurgitation. - Right ventricle: The cavity size was normal. Wall thickness was normal. Systolic function was normal. - Tricuspid valve: There was mild regurgitation. - Pulmonary arteries: Systolic pressure was within the normal range. - Inferior vena cava: The vessel was normal in size. - Pericardium, extracardiac: There was no pericardial effusion.  Impressions:  - Bicuspid or functionally bicuspid aortic valve with thickened leaflets. Mild aortic stenosis and mild aortic regurgitation. Ascending aorta has upper normal size measuring 39 mm.  Carotid Doppler ultrasound 06/16/2017: Right Carotid: Velocities in the right ICA are consistent with a 1-39% stenosis.  Left Carotid: Velocities in the left ICA are consistent with a 1-39% stenosis.  Vertebrals: Both vertebral arteries were patent with antegrade flow.   ASSESSMENT AND PLAN:  1. Sinus tachycardia - this is chronic for him - now on  low dose CCB therapy which he is tolerating very well. HR is down. Ok to proceed with his back injection.   2. Spinal stenosis - plan per Dr. Sherwood Gambler.   3. HLD - has not wished to be on statin therapy due to intolerance.   4. HTN - BP is fine on his current regimen.   5.  Carotid  disease - needs CV risk factor modification - does not wish to be on statin therapy. Consider recheck in 2021.   6. COVID-19 Education: The signs and symptoms of COVID-19 were discussed with the patient and how to seek care for testing (follow up with PCP or arrange E-visit).  The importance of social distancing, staying at home, hand hygiene and wearing a mask when out in public were discussed today.  Current medicines are reviewed with the patient today.  The patient does not have concerns regarding medicines other than what has been noted above.  The following changes have been made:  See above.  Labs/ tests ordered today include:    Orders Placed This Encounter  Procedures  . EKG 12-Lead     Disposition:   FU with Dr. Marlou Porch in 4 months.  Recheck EKG on return.   Patient is agreeable to this plan and will call if any problems develop in the interim.   SignedTruitt Merle, NP  02/23/2019 3:41 PM  [facility] [address] [place], [place]  [address] Phone: [phone] Fax: [phone]

## 2019-02-23 ENCOUNTER — Encounter: Payer: Self-pay | Admitting: Nurse Practitioner

## 2019-02-23 ENCOUNTER — Ambulatory Visit: Payer: 59 | Admitting: Nurse Practitioner

## 2019-02-23 ENCOUNTER — Other Ambulatory Visit: Payer: Self-pay

## 2019-02-23 VITALS — BP 134/80 | HR 111 | Ht 68.0 in | Wt 161.1 lb

## 2019-02-23 DIAGNOSIS — E782 Mixed hyperlipidemia: Secondary | ICD-10-CM

## 2019-02-23 DIAGNOSIS — I700 Atherosclerosis of aorta: Secondary | ICD-10-CM

## 2019-02-23 DIAGNOSIS — R000 Tachycardia, unspecified: Secondary | ICD-10-CM

## 2019-02-23 DIAGNOSIS — I10 Essential (primary) hypertension: Secondary | ICD-10-CM

## 2019-02-23 DIAGNOSIS — Z7189 Other specified counseling: Secondary | ICD-10-CM

## 2019-02-23 NOTE — Patient Instructions (Addendum)
After Visit Summary:  We will be checking the following labs today - NONE   Medication Instructions:    Continue with your current medicines.    If you need a refill on your cardiac medications before your next appointment, please call your pharmacy.     Testing/Procedures To Be Arranged:  N/A  Follow-Up:   See Dr. Marlou Porch in 4 months with EKG    At [facility], you and your health needs are our priority.  As part of our continuing mission to provide you with exceptional heart care, we have created designated Provider Care Teams.  These Care Teams include your primary Cardiologist (physician) and Advanced Practice Providers (APPs -  Physician Assistants and Nurse Practitioners) who all work together to provide you with the care you need, when you need it.  Special Instructions:  . Stay safe, stay home, wash your hands for at least 20 seconds and wear a mask when out in public.  . It was good to talk with you today. . I will send a note to Dr. Sherwood Gambler so you can get your back injection rescheduled.    Call the [place] office at [phone] if you have any questions, problems or concerns.

## 2019-02-24 ENCOUNTER — Telehealth: Payer: Self-pay | Admitting: Nurse Practitioner

## 2019-02-24 NOTE — Telephone Encounter (Signed)
Spoke with wife [name] (on [place]).  She is requesting Lori's note from yesterday that includes clearance for back injection be sent to Dr Donnella Bi office.  Linwood Dibbles sent it yesterday but I will be happy to re-fax it electronically.  She thanked me for the call and will have his office be on the look out for it.

## 2019-02-24 NOTE — Telephone Encounter (Signed)
New message   Per Patient's wife states that an EKG needs to be sent to Dr. Jovita Gamma office. Please call to discuss.

## 2019-03-17 DIAGNOSIS — M5116 Intervertebral disc disorders with radiculopathy, lumbar region: Secondary | ICD-10-CM | POA: Diagnosis not present

## 2019-03-17 DIAGNOSIS — M48061 Spinal stenosis, lumbar region without neurogenic claudication: Secondary | ICD-10-CM | POA: Diagnosis not present

## 2019-03-17 DIAGNOSIS — M4726 Other spondylosis with radiculopathy, lumbar region: Secondary | ICD-10-CM | POA: Diagnosis not present

## 2019-03-30 DIAGNOSIS — Z23 Encounter for immunization: Secondary | ICD-10-CM | POA: Diagnosis not present

## 2019-04-21 DIAGNOSIS — L814 Other melanin hyperpigmentation: Secondary | ICD-10-CM | POA: Diagnosis not present

## 2019-04-21 DIAGNOSIS — L570 Actinic keratosis: Secondary | ICD-10-CM | POA: Diagnosis not present

## 2019-04-21 DIAGNOSIS — H61002 Unspecified perichondritis of left external ear: Secondary | ICD-10-CM | POA: Diagnosis not present

## 2019-04-21 DIAGNOSIS — D225 Melanocytic nevi of trunk: Secondary | ICD-10-CM | POA: Diagnosis not present

## 2019-04-21 DIAGNOSIS — L821 Other seborrheic keratosis: Secondary | ICD-10-CM | POA: Diagnosis not present

## 2019-06-24 DIAGNOSIS — M5136 Other intervertebral disc degeneration, lumbar region: Secondary | ICD-10-CM | POA: Diagnosis not present

## 2019-06-24 DIAGNOSIS — M48062 Spinal stenosis, lumbar region with neurogenic claudication: Secondary | ICD-10-CM | POA: Diagnosis not present

## 2019-06-24 DIAGNOSIS — M5416 Radiculopathy, lumbar region: Secondary | ICD-10-CM | POA: Diagnosis not present

## 2019-06-24 DIAGNOSIS — M47816 Spondylosis without myelopathy or radiculopathy, lumbar region: Secondary | ICD-10-CM | POA: Diagnosis not present

## 2019-06-30 NOTE — Progress Notes (Signed)
CARDIOLOGY OFFICE NOTE  Date:  07/06/2019    Philip Winters Date of Birth: July 30, 1960 Medical Record [id number]  PCP:  Antony Contras, MD  Cardiologist:  Marisa Cyphers  Chief Complaint  Patient presents with  . Follow-up    Seen for Dr. Marlou Porch    History of Present Illness: Philip Winters is a 60 y.o. male who presents today for a follow up visit. Seen for Dr. Marlou Porch.   He has a history of hypertension, prior tobacco use, PAD with carotid disease and tachycardia.  He was initially referred to Dr. Marlou Porch by his PCP for uncontrolled hypertension. They had tried beta-blockers in the past which were not tolerated well and therefore was referred to cardiology for assistance. Also noted to have some issues with tachycardia. Patient self-reported his heart rate has always been in the low 100s for most of his life. An echocardiogram was performed to ensure proper structure and function of his heart out of concern for tachycardia mediated cardiomyopathy. Diltiazem 120 mg CD was suggested. Echocardiogram from 07/23/2017 with normal LVEF at 60% with bicuspid or functionally bicuspid aortic valve thickening, mild aortic stenosis and mild aortic regurgitation with an ascending aorta on the upper side of normal at 39 mm. Plan was to repeat echocardiogram in 3 to 5 years and follow-up yearly from thereon out.  He was then seen in the emergency department on 02/03/2019 with asymptomatic tachycardia. Patient reported that he was seen at an orthopedic office earlier in the day for set up for pain control injections into his lumbar spine however the procedure was aborted when he was noted to have an heart rate of 150 bpm. Patient was completely asymptomatic and reported that he normally runs between 101-110 bpm at baseline. He had no palpitations, lightheadedness, shortness of breath or chest pain.  Seen here in September by Kathyrn Drown, NP for follow up. Found that the patient had never  started the low dose Diltiazem. He had refused this in the ED as well. He was concerned that his BP would be too low. He was previously reluctant as above to try any new medications given his experience with beta-blockers. I then saw him in October - lots of back pain - felt like this was driving his elevation in his heart rate. Did not wish to take anything for his lipids. He was on his CCB therapy and HR was satisfactory.    The patient does not have symptoms concerning for COVID-19 infection (fever, chills, cough, or new shortness of breath).   Comes in today. Here alone. He feels like he is doing well. Still working - has his own plumbing visit - notes the challenges with the pandemic and going into people's homes. No chest pain. Breathing is good. No syncope. Still with some back issues. No real awareness of his fast heart beat. Not dizzy. BP has been good. He notes he has had prior cramps with statins - does not wish to take - has upcoming visit with PCP with fasting labs.   Past Medical History:  Diagnosis Date  . Allergy   . Arthritis   . GERD (gastroesophageal reflux disease)    occ  . Heart murmur    baby  . High cholesterol   . Hypertension   . Pneumonia   . Right carotid bruit 06/10/2017  . Sciatica   . Seasonal allergies   . Sinus tachycardia   . Spinal stenosis     Past Surgical History:  Procedure Laterality Date  . CARPAL TUNNEL WITH CUBITAL TUNNEL Left 11/10/2013   Procedure: LEFT CARPAL TUNNEL RELEASE;  Surgeon: Cammie Sickle, MD;  Location: [place];  Service: Orthopedics;  Laterality: Left;  . COLONOSCOPY    . KNEE ARTHROSCOPY  [id number]   left and right  . LUMBAR LAMINECTOMY/DECOMPRESSION MICRODISCECTOMY Left 09/29/2017   Procedure: LEFT LUMBAR TWO - LUMBAR THREELAMINOTOMY/MICRODISCECTOMY;  Surgeon: Jovita Gamma, MD;  Location: [place];  Service: Neurosurgery;  Laterality: Left;  LEFT LUMBAR 2- LUMBAR 3 LAMINOTOMY/MICRODISCECTOMY  . NASAL  SINUS SURGERY  05/2015  . TOTAL KNEE ARTHROPLASTY Right 06/24/2016   Procedure: TOTAL KNEE ARTHROPLASTY;  Surgeon: Garald Balding, MD;  Location: [place];  Service: Orthopedics;  Laterality: Right;  . TRIGGER FINGER RELEASE Left 11/10/2013   Procedure: LEFT INDEX A-1 PULLEY RELEASE;  Surgeon: Cammie Sickle, MD;  Location: [place];  Service: Orthopedics;  Laterality: Left;  . ULNAR NERVE TRANSPOSITION Left 11/10/2013   Procedure: ULNAR NERVE TRANSPOSITION;  Surgeon: Cammie Sickle, MD;  Location: [facility];  Service: Orthopedics;  Laterality: Left;     Medications: Current Meds  Medication Sig  . acetaminophen (TYLENOL) 500 MG tablet Take 500 mg by mouth as needed.  Marland Kitchen aspirin EC 81 MG tablet Take 81 mg by mouth daily.  Marland Kitchen diltiazem (CARDIZEM CD) 120 MG 24 hr capsule Take 1 capsule (120 mg total) by mouth daily.  . diphenhydrAMINE (BENADRYL) 25 MG tablet Take 25 mg by mouth at bedtime as needed for allergies.  . fluticasone (FLONASE ALLERGY RELIEF) 50 MCG/ACT nasal spray as needed.  . Multiple Vitamin (MULTIVITAMIN WITH MINERALS) TABS tablet Take 1 tablet by mouth daily.  Marland Kitchen olmesartan (BENICAR) 40 MG tablet Take 40 mg by mouth daily.  Marland Kitchen omeprazole (PRILOSEC) 20 MG capsule Take 20 mg by mouth as needed.  Marland Kitchen tetrahydrozoline 0.05 % ophthalmic solution Place 1 drop into both eyes 3 (three) times daily as needed (for dry/irritated eyes.).  Marland Kitchen traMADol (ULTRAM) 50 MG tablet TK 1 T PO  Q 6 HOURS PRN     Allergies: Allergies  Allergen Reactions  . Mushroom Extract Complex Anaphylaxis  . Penicillins Shortness Of Breath    Childhood allergy.  Has patient had a PCN reaction causing immediate rash, facial/tongue/throat swelling, SOB or lightheadedness with hypotension: No Has patient had a PCN reaction causing severe rash involving mucus membranes or skin necrosis: No Has patient had a PCN reaction that required hospitalization No Has patient had a PCN  reaction occurring within the last 10 years: No If all of the above answers are "NO", then may proceed with C  . Dilaudid [Hydromorphone] Nausea And Vomiting  . Bystolic [Nebivolol Hcl] Other (See Comments)    Extreme fatigue, not able to focus  . Codeine Nausea And Vomiting  . Iodine Rash  . Metoprolol Other (See Comments)    Made patient very fatigued, could not focus    Social History: The patient  reports that he quit smoking about 3 years ago. His smoking use included cigarettes. He has a 37.00 pack-year smoking history. He has never used smokeless tobacco. He reports current alcohol use. He reports that he does not use drugs.   Family History: The patient's family history is not on file.   Review of Systems: Please see the history of present illness.   All other systems are reviewed and negative.   Physical Exam: VS:  BP 124/82   Pulse Marland Kitchen)  113   Ht 5\' 8"  (1.727 m)   Wt 160 lb 12.8 oz (72.9 kg)   SpO2 97%   BMI 24.45 kg/m  .  BMI Body mass index is 24.45 kg/m.  Wt Readings from Last 3 Encounters:  07/06/19 160 lb 12.8 oz (72.9 kg)  02/23/19 161 lb 1.9 oz (73.1 kg)  02/07/19 163 lb (73.9 kg)    General: Alert and in no acute distress.   Cardiac: Regular rate and rhythm. HR still a little fast. He has a soft outflow murmur of AS noted.  No edema.  Respiratory:  Lungs are clear to auscultation bilaterally with normal work of breathing.  MS: No deformity or atrophy. Gait and ROM intact.  Skin: Warm and dry. Color is normal.  Neuro:  Strength and sensation are intact and no gross focal deficits noted.  Psych: Alert, appropriate and with normal affect.   LABORATORY DATA:  EKG:  EKG is ordered today. This demonstrates sinus tach - HR is 103 today.  Lab Results  Component Value Date   WBC 5.0 09/29/2017   HGB 15.0 09/29/2017   HCT 42.7 09/29/2017   PLT 216 09/29/2017   GLUCOSE 111 (H) 09/29/2017   ALT 43 06/17/2016   AST 44 (H) 06/17/2016   NA 138 09/29/2017    K 4.3 09/29/2017   CL 104 09/29/2017   CREATININE 0.70 09/29/2017   BUN 12 09/29/2017   CO2 22 09/29/2017   INR 0.93 06/17/2016       BNP (last 3 results) No results for input(s): BNP in the last 8760 hours.  ProBNP (last 3 results) No results for input(s): PROBNP in the last 8760 hours.   Other Studies Reviewed Today:  Echocardiogram 07/23/2017: Study Conclusions  - Left ventricle: The cavity size was normal. There was mild concentric hypertrophy. Systolic function was normal. The estimated ejection fraction was in the range of 60% to 65%. Wall motion was normal; there were no regional wall motion abnormalities. Doppler parameters are consistent with abnormal left ventricular relaxation (grade 1 diastolic dysfunction). There was no evidence of elevated ventricular filling pressure by Doppler parameters. - Aortic valve: Bicuspid; moderately thickened, moderately calcified leaflets. Valve mobility was restricted. There was mild stenosis. There was mild regurgitation. - Mitral valve: There was mild regurgitation. - Right ventricle: The cavity size was normal. Wall thickness was normal. Systolic function was normal. - Tricuspid valve: There was mild regurgitation. - Pulmonary arteries: Systolic pressure was within the normal range. - Inferior vena cava: The vessel was normal in size. - Pericardium, extracardiac: There was no pericardial effusion.  Impressions:  - Bicuspid or functionally bicuspid aortic valve with thickened leaflets. Mild aortic stenosis and mild aortic regurgitation. Ascending aorta has upper normal size measuring 39 mm.  Carotid Doppler ultrasound 06/16/2017: Right Carotid: Velocities in the right ICA are consistent with a 1-39% stenosis.  Left Carotid: Velocities in the left ICA are consistent with a 1-39% stenosis.  Vertebrals: Both vertebral arteries were patent with antegrade flow.   ASSESSMENT AND  PLAN:  1. Persistent sinus tachycardia - this is chronic. He has a normal EF by prior echo - HR is stable - remains on low dose CCB therapy.   2. Spinal stenosis - followed by Dr. Sherwood Gambler  3. HLD - some statin intolerance - has upcoming labs with his PCP - could consider PCSK9 therapy.   4. HTN - BP is fine.   5.Carotid disease - needs CV risk factor modification - has not wish  to be on statin therapy due to cramping. Has bilateral 1 to 39% stenosis per his study in 05/2017. Will get updated.   6. COVID-19 Education: The signs and symptoms of COVID-19 were discussed with the patient and how to seek care for testing (follow up with PCP or arrange E-visit).  The importance of social distancing, staying at home, hand hygiene and wearing a mask when out in public were discussed today.  Current medicines are reviewed with the patient today.  The patient does not have concerns regarding medicines other than what has been noted above.  The following changes have been made:  See above.  Labs/ tests ordered today include:    Orders Placed This Encounter  Procedures  . EKG 12-Lead  . VAS US CAROTID     Disposition:   FU with Dr. Marlou Porch in 6 months.   Patient is agreeable to this plan and will call if any problems develop in the interim.   SignedTruitt Merle, NP  07/06/2019 [facility] [address] [place], [place]  [address] Phone: [phone] Fax: [phone]

## 2019-07-06 ENCOUNTER — Encounter: Payer: Self-pay | Admitting: Nurse Practitioner

## 2019-07-06 ENCOUNTER — Ambulatory Visit: Payer: BC Managed Care – PPO | Admitting: Nurse Practitioner

## 2019-07-06 ENCOUNTER — Other Ambulatory Visit: Payer: Self-pay

## 2019-07-06 VITALS — BP 124/82 | HR 113 | Ht 68.0 in | Wt 160.8 lb

## 2019-07-06 DIAGNOSIS — I700 Atherosclerosis of aorta: Secondary | ICD-10-CM | POA: Diagnosis not present

## 2019-07-06 DIAGNOSIS — I6523 Occlusion and stenosis of bilateral carotid arteries: Secondary | ICD-10-CM

## 2019-07-06 DIAGNOSIS — Z7189 Other specified counseling: Secondary | ICD-10-CM | POA: Diagnosis not present

## 2019-07-06 DIAGNOSIS — E782 Mixed hyperlipidemia: Secondary | ICD-10-CM

## 2019-07-06 DIAGNOSIS — I10 Essential (primary) hypertension: Secondary | ICD-10-CM | POA: Diagnosis not present

## 2019-07-06 NOTE — Patient Instructions (Addendum)
After Visit Summary:  We will be checking the following labs today - NONE   Medication Instructions:    Continue with your current medicines.    If you need a refill on your cardiac medications before your next appointment, please call your pharmacy.     Testing/Procedures To Be Arranged:  Carotid dopplers  Follow-Up:   See Dr. Marlou Porch in 6 months - You will receive a reminder letter in the mail two months in advance. If you don't receive a letter, please call our office to schedule the follow-up appointment.    At [facility], you and your health needs are our priority.  As part of our continuing mission to provide you with exceptional heart care, we have created designated Provider Care Teams.  These Care Teams include your primary Cardiologist (physician) and Advanced Practice Providers (APPs -  Physician Assistants and Nurse Practitioners) who all work together to provide you with the care you need, when you need it.  Special Instructions:  . Stay safe, stay home, wash your hands for at least 20 seconds and wear a mask when out in public.  . It was good to talk with you today.  . Send [name] a copy of your upcoming labs   Call the [place] office at [phone] if you have any questions, problems or concerns.

## 2019-07-14 DIAGNOSIS — M48061 Spinal stenosis, lumbar region without neurogenic claudication: Secondary | ICD-10-CM | POA: Diagnosis not present

## 2019-07-14 DIAGNOSIS — M5136 Other intervertebral disc degeneration, lumbar region: Secondary | ICD-10-CM | POA: Diagnosis not present

## 2019-07-14 DIAGNOSIS — M4726 Other spondylosis with radiculopathy, lumbar region: Secondary | ICD-10-CM | POA: Diagnosis not present

## 2019-08-02 ENCOUNTER — Other Ambulatory Visit: Payer: Self-pay

## 2019-08-02 ENCOUNTER — Ambulatory Visit (HOSPITAL_COMMUNITY)
Admission: RE | Admit: 2019-08-02 | Discharge: 2019-08-02 | Disposition: A | Discharge status: Home / Self Care | Payer: BC Managed Care – PPO | Source: Ambulatory Visit | Attending: Cardiology | Admitting: Cardiology

## 2019-08-02 DIAGNOSIS — I6523 Occlusion and stenosis of bilateral carotid arteries: Secondary | ICD-10-CM | POA: Diagnosis not present

## 2019-08-04 DIAGNOSIS — I10 Essential (primary) hypertension: Secondary | ICD-10-CM | POA: Diagnosis not present

## 2019-08-04 DIAGNOSIS — Z0000 Encounter for general adult medical examination without abnormal findings: Secondary | ICD-10-CM | POA: Diagnosis not present

## 2019-08-04 DIAGNOSIS — E782 Mixed hyperlipidemia: Secondary | ICD-10-CM | POA: Diagnosis not present

## 2019-08-04 DIAGNOSIS — Z125 Encounter for screening for malignant neoplasm of prostate: Secondary | ICD-10-CM | POA: Diagnosis not present

## 2019-08-04 DIAGNOSIS — K760 Fatty (change of) liver, not elsewhere classified: Secondary | ICD-10-CM | POA: Diagnosis not present

## 2019-08-04 DIAGNOSIS — R000 Tachycardia, unspecified: Secondary | ICD-10-CM | POA: Diagnosis not present

## 2019-09-28 ENCOUNTER — Encounter: Payer: Self-pay | Admitting: Orthopaedic Surgery

## 2019-09-28 ENCOUNTER — Ambulatory Visit: Payer: Self-pay

## 2019-09-28 ENCOUNTER — Ambulatory Visit: Payer: BC Managed Care – PPO | Admitting: Orthopaedic Surgery

## 2019-09-28 ENCOUNTER — Other Ambulatory Visit: Payer: Self-pay

## 2019-09-28 VITALS — Ht 68.0 in | Wt 160.0 lb

## 2019-09-28 DIAGNOSIS — M25562 Pain in left knee: Secondary | ICD-10-CM

## 2019-09-28 DIAGNOSIS — M170 Bilateral primary osteoarthritis of knee: Secondary | ICD-10-CM | POA: Diagnosis not present

## 2019-09-28 DIAGNOSIS — G8929 Other chronic pain: Secondary | ICD-10-CM

## 2019-09-28 MED ORDER — LIDOCAINE HCL 1 % IJ SOLN
2.0000 mL | INTRAMUSCULAR | Status: AC | PRN
  Administered 2019-09-28: 2 mL

## 2019-09-28 MED ORDER — METHYLPREDNISOLONE ACETATE 40 MG/ML IJ SUSP
80.0000 mg | INTRAMUSCULAR | Status: AC | PRN
  Administered 2019-09-28: 80 mg via INTRA_ARTICULAR

## 2019-09-28 MED ORDER — BUPIVACAINE HCL 0.5 % IJ SOLN
2.0000 mL | INTRAMUSCULAR | Status: AC | PRN
  Administered 2019-09-28: 2 mL via INTRA_ARTICULAR

## 2019-09-28 NOTE — Progress Notes (Signed)
Office Visit Note   Patient: Philip Winters           Date of Birth: 08/09/1960           MRN: [id number] Visit Date: 09/28/2019              Requested by: Antony Contras, MD [place],  [place] [address] PCP: Antony Contras, MD   Assessment & Plan: Visit Diagnoses:  1. Chronic pain of left knee   2. Bilateral primary osteoarthritis of knee     Plan: Mild progression of left knee osteoarthritis.  Long discussion regarding x-ray findings and treatment over time including knee replacement.  I do not think he is ready for that yet.  Will inject the knee with cortisone.  Have also discussed Visco supplementation  Follow-Up Instructions: Return if symptoms worsen or fail to improve.   Orders:  Orders Placed This Encounter  Procedures   Large Joint Inj: L knee   XR KNEE 3 VIEW LEFT   No orders of the defined types were placed in this encounter.     Procedures: Large Joint Inj: L knee on 09/28/2019 10:30 AM Indications: pain and diagnostic evaluation Details: 25 G 1.5 in needle, anteromedial approach  Arthrogram: No  Medications: 2 mL lidocaine 1 %; 2 mL bupivacaine 0.5 %; 80 mg methylPREDNISolone acetate 40 MG/ML Procedure, treatment alternatives, risks and benefits explained, specific risks discussed. Consent was given by the patient. Patient was prepped and draped in the usual sterile fashion.       Clinical Data: No additional findings.   Subjective: Chief Complaint  Patient presents with   Left Knee - Pain  Patient presents today for recurrent left knee pain. He was here last in January of 2020 and received a cortisone injection. He said that the injection helped. He started to have pain again a few weeks ago with swelling. His pain is located posteriorly. No grinding or catching. He states that it will give way "a little bit". He takes Tylenol as needed.   HPI  Review of Systems   Objective: Vital Signs: Ht 5\' 8"  (1.727 m)    Wt 160  lb (72.6 kg)    BMI 24.33 kg/m   Physical Exam Constitutional:      Appearance: He is well-developed.  Eyes:     Pupils: Pupils are equal, round, and reactive to light.  Pulmonary:     Effort: Pulmonary effort is normal.  Skin:    General: Skin is warm and dry.  Neurological:     Mental Status: He is alert and oriented to person, place, and time.  Psychiatric:        Behavior: Behavior normal.     Ortho Exam awake alert and oriented x3.  Comfortable sitting and walking.  No limp.  Left knee with very minimal effusion.  Increased varus slightly.  Lacks of about 5 degrees to full knee extension flexed about 110 degrees.  No instability.  Predominately medial joint pain.  No calf pain.  Motor exam intact Specialty Comments:  No specialty comments available.  Imaging: XR KNEE 3 VIEW LEFT  Result Date: 09/28/2019 Films of the left knee obtained in 3 projections standing.  These were compared with films that were performed over a year ago.  There is some mild progression of the medial compartment arthritis with slightly more narrowing of the joint space.  There is some mild subchondral sclerosis.  There also is some ectopic calcification  within the menisci consistent with CPPD.  This is no change from prior films.  Mild degenerative changes in the lateral and patellofemoral compartments    PMFS History: Patient Active Problem List   Diagnosis Date Noted   Bilateral primary osteoarthritis of knee 09/28/2019   HNP (herniated nucleus pulposus), lumbar 09/29/2017   Right carotid bruit 06/10/2017   Paresthesia 06/10/2017   Left wrist pain 10/09/2016   Unilateral primary osteoarthritis, right knee 06/24/2016   S/P total knee replacement using cement, right 06/24/2016   Acute pain of left knee 03/27/2016   HYPERLIPIDEMIA 01/18/2007   HYPERTENSION 01/18/2007   Past Medical History:  Diagnosis Date   Allergy    Arthritis    GERD (gastroesophageal reflux disease)    occ     Heart murmur    baby   High cholesterol    Hypertension    Pneumonia    Right carotid bruit 06/10/2017   Sciatica    Seasonal allergies    Sinus tachycardia    Spinal stenosis     History reviewed. No pertinent family history.  Past Surgical History:  Procedure Laterality Date   CARPAL TUNNEL WITH CUBITAL TUNNEL Left 11/10/2013   Procedure: LEFT CARPAL TUNNEL RELEASE;  Surgeon: Cammie Sickle, MD;  Location: [facility];  Service: Orthopedics;  Laterality: Left;   COLONOSCOPY     KNEE ARTHROSCOPY  [id number]   left and right   LUMBAR LAMINECTOMY/DECOMPRESSION MICRODISCECTOMY Left 09/29/2017   Procedure: LEFT LUMBAR TWO - LUMBAR THREELAMINOTOMY/MICRODISCECTOMY;  Surgeon: Jovita Gamma, MD;  Location: [facility];  Service: Neurosurgery;  Laterality: Left;  LEFT LUMBAR 2- LUMBAR 3 LAMINOTOMY/MICRODISCECTOMY   NASAL SINUS SURGERY  05/2015   TOTAL KNEE ARTHROPLASTY Right 06/24/2016   Procedure: TOTAL KNEE ARTHROPLASTY;  Surgeon: Garald Balding, MD;  Location: [facility];  Service: Orthopedics;  Laterality: Right;   TRIGGER FINGER RELEASE Left 11/10/2013   Procedure: LEFT INDEX A-1 PULLEY RELEASE;  Surgeon: Cammie Sickle, MD;  Location: [facility];  Service: Orthopedics;  Laterality: Left;   ULNAR NERVE TRANSPOSITION Left 11/10/2013   Procedure: ULNAR NERVE TRANSPOSITION;  Surgeon: Cammie Sickle, MD;  Location: [facility];  Service: Orthopedics;  Laterality: Left;   Social History   Occupational History   Occupation: Self employed  Tobacco Use   Smoking status: Former Smoker    Packs/day: 1.00    Years: 37.00    Pack years: 37.00    Types: Cigarettes    Quit date: 05/04/2016    Years since quitting: 3.4   Smokeless tobacco: Never Used  Substance and Sexual Activity   Alcohol use: Yes    Alcohol/week: 0.0 standard drinks    Comment: occ   Drug use: No   Sexual activity: Not on file

## 2019-10-10 ENCOUNTER — Telehealth: Payer: Self-pay | Admitting: Orthopaedic Surgery

## 2019-10-10 NOTE — Telephone Encounter (Signed)
Patient called advised the injection he had 2 weeks ago did not take. Patient said his left knee has gotten worse. The number to contact patient is [phone]

## 2019-10-10 NOTE — Telephone Encounter (Signed)
Lets try the gel injections

## 2019-10-10 NOTE — Telephone Encounter (Signed)
Please advise. In the last note you spoke with him about replacement, but didn't feel he was ready. You also mentioned gel injections. Please advise.

## 2019-10-11 ENCOUNTER — Telehealth: Payer: Self-pay

## 2019-10-11 NOTE — Telephone Encounter (Signed)
Noted  

## 2019-10-11 NOTE — Telephone Encounter (Signed)
Spoke with patient and he wants to try left knee gel injections. Sent message to get precert.

## 2019-10-11 NOTE — Telephone Encounter (Signed)
Submitted VOB for Synvisc series, left knee.

## 2019-10-11 NOTE — Telephone Encounter (Signed)
Please precert for left knee gel injections. This is Dr.Whitfield's patient. Thanks! 

## 2019-10-13 ENCOUNTER — Telehealth: Payer: Self-pay | Admitting: Cardiology

## 2019-10-13 NOTE — Telephone Encounter (Signed)
I spoke to the patient who was calling because of his elevated BP over the past few days.  It has been ranging around 140/90.  He states that he was without his Olmesartan for a few days and figured this may be the cause.    He is asymptomatic and will continue to monitor now that he has resumed his medication.  He will update [name] on Tuesday 6/2.

## 2019-10-13 NOTE — Telephone Encounter (Signed)
Pt c/o BP issue: STAT if pt c/o blurred vision, one-sided weakness or slurred speech  1. What are your last 5 BP readings?  10/13/19 141/90 HR 90   2. Are you having any other symptoms (ex. Dizziness, headache, blurred vision, passed out)? No   3. What is your BP issue? Philip Winters is calling stating he has been having elevated BP for the past 2-3 days. He states he isn't having any symptoms other than the elevated BP, but just wanted to make Dr. Marlou Porch and Truitt Merle aware. Please advise.

## 2019-10-21 ENCOUNTER — Telehealth: Payer: Self-pay

## 2019-10-21 ENCOUNTER — Telehealth: Payer: Self-pay | Admitting: Orthopaedic Surgery

## 2019-10-21 NOTE — Telephone Encounter (Signed)
Patient aware that he is approved for gel injection.  Approved, Synvisc series, left knee. Buy & Bill Covered at 100% after Co-pay Co-pay of $80.00 required PA required PA Approval# [id number] Valid 10/12/2019- 04/12/2020

## 2019-10-21 NOTE — Telephone Encounter (Signed)
Patient called wanting to get the status of the authorization for his gel injection.  CB#[phone].  Thank you.

## 2019-10-21 NOTE — Telephone Encounter (Signed)
Talked with patient and advised him, that he has been approved for gel injection.  Appointment scheduled for 11/08/2019 with Dr. Durward Fortes.

## 2019-11-08 ENCOUNTER — Other Ambulatory Visit: Payer: Self-pay

## 2019-11-08 ENCOUNTER — Encounter: Payer: Self-pay | Admitting: Orthopaedic Surgery

## 2019-11-08 ENCOUNTER — Ambulatory Visit: Payer: BC Managed Care – PPO | Admitting: Orthopaedic Surgery

## 2019-11-08 DIAGNOSIS — M1712 Unilateral primary osteoarthritis, left knee: Secondary | ICD-10-CM | POA: Diagnosis not present

## 2019-11-08 MED ORDER — LIDOCAINE HCL 1 % IJ SOLN
2.0000 mL | INTRAMUSCULAR | Status: AC | PRN
  Administered 2019-11-08: 2 mL

## 2019-11-08 MED ORDER — HYLAN G-F 20 16 MG/2ML IX SOSY
16.0000 mg | PREFILLED_SYRINGE | INTRA_ARTICULAR | Status: AC | PRN
  Administered 2019-11-08: 16 mg via INTRA_ARTICULAR

## 2019-11-08 NOTE — Progress Notes (Signed)
Office Visit Note   Patient: Philip Winters           Date of Birth: 1960/09/11           MRN: [id number] Visit Date: 11/08/2019              Requested by: Antony Contras, MD [address] [place],  [place] [address] PCP: Antony Contras, MD   Assessment & Plan: Visit Diagnoses:  1. Primary osteoarthritis of left knee     Plan:  #1: We are going to give him a series of Synvisc injections the first was completed today without difficulty to the left knee. #2: Follow back up with [name] in 1 week for his second Synvisc injection to the left knee.  Follow-Up Instructions: No follow-ups on file.   Orders:  No orders of the defined types were placed in this encounter.  No orders of the defined types were placed in this encounter.     Procedures: Large Joint Inj: L knee on 11/08/2019 9:14 AM Indications: pain and joint swelling Details: 25 G 1.5 in needle, anteromedial approach  Arthrogram: No  Medications: 2 mL lidocaine 1 %; 16 mg Hylan 16 MG/2ML Outcome: tolerated well, no immediate complications Procedure, treatment alternatives, risks and benefits explained, specific risks discussed. Consent was given by the patient. Immediately prior to procedure a time out was called to verify the correct patient, procedure, equipment, support staff and site/side marked as required. Patient was prepped and draped in the usual sterile fashion.       Clinical Data: No additional findings.   Subjective: Chief Complaint  Patient presents with  . Left Knee - Follow-up    Synvisc series started 11-08-2019    HPI: Philip Winters presents today for the first synvisc injection in his left knee.  He is status post right total knee arthroplasty with good results.  He is starting to develop worsening pain in his left knee.  He had a corticosteroid injection in May 20, 2018 which was helpful to him. He does have some feeling of giving way.  He also is developing some mild pain as well as  swelling in the left knee.  No recent history of injury or trauma.  He uses Tylenol intermittently.  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   Review of Systems  Constitutional: Negative for fatigue.  HENT: Negative for ear pain.   Eyes: Negative for pain.  Respiratory: Negative for shortness of breath.   Cardiovascular: Negative for leg swelling.  Gastrointestinal: Negative for constipation and diarrhea.  Endocrine: Negative for cold intolerance and heat intolerance.  Genitourinary: Negative for difficulty urinating.  Musculoskeletal: Positive for joint swelling.  Skin: Negative for rash.  Allergic/Immunologic: Positive for food allergies.  Neurological: Negative for weakness.  Hematological: Does not bruise/bleed easily.  Psychiatric/Behavioral: Negative for sleep disturbance.     Objective: Vital Signs: Ht 5\' 8"  (1.727 m)   Wt 160 lb (72.6 kg)   BMI 24.33 kg/m   Physical Exam Constitutional:      Appearance: Normal appearance. He is well-developed.  HENT:     Head: Normocephalic.  Eyes:     Pupils: Pupils are equal, round, and reactive to light.  Pulmonary:     Effort: Pulmonary effort is normal.  Skin:    General: Skin is warm and dry.  Neurological:     Mental Status: He is alert and oriented to person, place, and time.  Psychiatric:  Mood and Affect: Mood normal.        Behavior: Behavior normal.        Thought Content: Thought content normal.        Judgment: Judgment normal.     Ortho Exam  Exam today reveals a range of motion from 5 degrees to about 110 degrees.  Minimal effusion at best.  He does have some varus deformity noted.  Neurovascular intact distally.  Specialty Comments:  No specialty comments available.  Imaging: No results found.   PMFS History: Current Outpatient Medications  Medication Sig Dispense Refill  . acetaminophen (TYLENOL) 500 MG tablet Take 500 mg by mouth as needed.    Marland Kitchen aspirin EC 81 MG tablet Take 81 mg by  mouth daily.    Marland Kitchen diltiazem (CARDIZEM CD) 120 MG 24 hr capsule Take 1 capsule (120 mg total) by mouth daily. 90 capsule 3  . diphenhydrAMINE (BENADRYL) 25 MG tablet Take 25 mg by mouth at bedtime as needed for allergies.    . fluticasone (FLONASE ALLERGY RELIEF) 50 MCG/ACT nasal spray as needed.    . Multiple Vitamin (MULTIVITAMIN WITH MINERALS) TABS tablet Take 1 tablet by mouth daily.    Marland Kitchen olmesartan (BENICAR) 40 MG tablet Take 40 mg by mouth daily.    Marland Kitchen omeprazole (PRILOSEC) 20 MG capsule Take 20 mg by mouth as needed.    Marland Kitchen tetrahydrozoline 0.05 % ophthalmic solution Place 1 drop into both eyes 3 (three) times daily as needed (for dry/irritated eyes.).    Marland Kitchen traMADol (ULTRAM) 50 MG tablet TK 1 T PO  Q 6 HOURS PRN     No current facility-administered medications for this visit.    Patient Active Problem List   Diagnosis Date Noted  . Primary osteoarthritis of left knee 11/08/2019  . Bilateral primary osteoarthritis of knee 09/28/2019  . HNP (herniated nucleus pulposus), lumbar 09/29/2017  . Right carotid bruit 06/10/2017  . Paresthesia 06/10/2017  . Left wrist pain 10/09/2016  . Unilateral primary osteoarthritis, right knee 06/24/2016  . S/P total knee replacement using cement, right 06/24/2016  . Acute pain of left knee 03/27/2016  . HYPERLIPIDEMIA 01/18/2007  . HYPERTENSION 01/18/2007   Past Medical History:  Diagnosis Date  . Allergy   . Arthritis   . GERD (gastroesophageal reflux disease)    occ  . Heart murmur    baby  . High cholesterol   . Hypertension   . Pneumonia   . Right carotid bruit 06/10/2017  . Sciatica   . Seasonal allergies   . Sinus tachycardia   . Spinal stenosis     History reviewed. No pertinent family history.  Past Surgical History:  Procedure Laterality Date  . CARPAL TUNNEL WITH CUBITAL TUNNEL Left 11/10/2013   Procedure: LEFT CARPAL TUNNEL RELEASE;  Surgeon: Cammie Sickle, MD;  Location: [facility];  Service: Orthopedics;   Laterality: Left;  . COLONOSCOPY    . KNEE ARTHROSCOPY  [id number]   left and right  . LUMBAR LAMINECTOMY/DECOMPRESSION MICRODISCECTOMY Left 09/29/2017   Procedure: LEFT LUMBAR TWO - LUMBAR THREELAMINOTOMY/MICRODISCECTOMY;  Surgeon: Jovita Gamma, MD;  Location: [facility];  Service: Neurosurgery;  Laterality: Left;  LEFT LUMBAR 2- LUMBAR 3 LAMINOTOMY/MICRODISCECTOMY  . NASAL SINUS SURGERY  05/2015  . TOTAL KNEE ARTHROPLASTY Right 06/24/2016   Procedure: TOTAL KNEE ARTHROPLASTY;  Surgeon: Garald Balding, MD;  Location: [facility];  Service: Orthopedics;  Laterality: Right;  . TRIGGER FINGER RELEASE Left 11/10/2013   Procedure: LEFT INDEX  A-1 PULLEY RELEASE;  Surgeon: Cammie Sickle, MD;  Location: [facility];  Service: Orthopedics;  Laterality: Left;  . ULNAR NERVE TRANSPOSITION Left 11/10/2013   Procedure: ULNAR NERVE TRANSPOSITION;  Surgeon: Cammie Sickle, MD;  Location: [facility];  Service: Orthopedics;  Laterality: Left;   Social History   Occupational History  . Occupation: Self employed  Tobacco Use  . Smoking status: Former Smoker    Packs/day: 1.00    Years: 37.00    Pack years: 37.00    Types: Cigarettes    Quit date: 05/04/2016    Years since quitting: 3.5  . Smokeless tobacco: Never Used  Vaping Use  . Vaping Use: Former  Substance and Sexual Activity  . Alcohol use: Yes    Alcohol/week: 0.0 standard drinks    Comment: occ  . Drug use: No  . Sexual activity: Not on file

## 2019-11-09 DIAGNOSIS — M5136 Other intervertebral disc degeneration, lumbar region: Secondary | ICD-10-CM | POA: Diagnosis not present

## 2019-11-09 DIAGNOSIS — Z9889 Other specified postprocedural states: Secondary | ICD-10-CM | POA: Diagnosis not present

## 2019-11-09 DIAGNOSIS — M5416 Radiculopathy, lumbar region: Secondary | ICD-10-CM | POA: Diagnosis not present

## 2019-11-09 DIAGNOSIS — M4726 Other spondylosis with radiculopathy, lumbar region: Secondary | ICD-10-CM | POA: Diagnosis not present

## 2019-11-17 ENCOUNTER — Other Ambulatory Visit: Payer: Self-pay

## 2019-11-17 ENCOUNTER — Encounter: Payer: Self-pay | Admitting: Orthopaedic Surgery

## 2019-11-17 ENCOUNTER — Ambulatory Visit: Payer: BC Managed Care – PPO | Admitting: Orthopaedic Surgery

## 2019-11-17 DIAGNOSIS — M25462 Effusion, left knee: Secondary | ICD-10-CM | POA: Diagnosis not present

## 2019-11-17 DIAGNOSIS — M1712 Unilateral primary osteoarthritis, left knee: Secondary | ICD-10-CM

## 2019-11-17 DIAGNOSIS — M25562 Pain in left knee: Secondary | ICD-10-CM | POA: Diagnosis not present

## 2019-11-17 MED ORDER — METHYLPREDNISOLONE ACETATE 40 MG/ML IJ SUSP
80.0000 mg | INTRAMUSCULAR | Status: AC | PRN
  Administered 2019-11-17: 80 mg via INTRA_ARTICULAR

## 2019-11-17 MED ORDER — BUPIVACAINE HCL 0.5 % IJ SOLN
2.0000 mL | INTRAMUSCULAR | Status: AC | PRN
  Administered 2019-11-17: 2 mL via INTRA_ARTICULAR

## 2019-11-17 MED ORDER — LIDOCAINE HCL 1 % IJ SOLN
2.0000 mL | INTRAMUSCULAR | Status: AC | PRN
  Administered 2019-11-17: 2 mL

## 2019-11-17 NOTE — Progress Notes (Signed)
Office Visit Note   Patient: Philip Winters           Date of Birth: Oct 16, 1960           MRN: [id number] Visit Date: 11/17/2019              Requested by: Antony Contras, MD [address],  [place] [address] PCP: Antony Contras, MD   Assessment & Plan: Visit Diagnoses:  1. Primary osteoarthritis of left knee     Plan: Mr. Rushing started Synvisc injections in the left knee last week.  He notes that his knee is actually a little bit more uncomfortable and more swollen than it was before his injection.  I aspirated about 35 cc of somewhat greenish-yellow cloudy fluid.  We will send it to the lab.  He does have evidence of CPPD on prior films.  I suspect that is what he has.  I injected cortisone.  We will check him again in a week.  The second Synvisc was not injected today  Follow-Up Instructions: Return in about 1 week (around 11/24/2019).   Orders:  No orders of the defined types were placed in this encounter.  No orders of the defined types were placed in this encounter.     Procedures: Large Joint Inj: L knee on 11/17/2019 3:12 PM Indications: pain and diagnostic evaluation Details: 25 G 1.5 in needle, anteromedial approach  Arthrogram: No  Medications: 2 mL lidocaine 1 %; 2 mL bupivacaine 0.5 %; 80 mg methylPREDNISolone acetate 40 MG/ML Aspirate: 35 mL yellow and cloudy; sent for lab analysis Procedure, treatment alternatives, risks and benefits explained, specific risks discussed. Consent was given by the patient. Patient was prepped and draped in the usual sterile fashion.       Clinical Data: No additional findings.   Subjective: Chief Complaint  Patient presents with  . Left Knee - Pain   Has experienced more swelling and discomfort in the left knee since his Synvisc injection last week.  No fever or chills HPI  Review of Systems   Objective: Vital Signs: There were no vitals taken for this visit.  Physical Exam Constitutional:       Appearance: He is well-developed.  Eyes:     Pupils: Pupils are equal, round, and reactive to light.  Pulmonary:     Effort: Pulmonary effort is normal.  Skin:    General: Skin is warm and dry.  Neurological:     Mental Status: He is alert and oriented to person, place, and time.  Psychiatric:        Behavior: Behavior normal.     Ortho Exam awake alert and oriented x3.  Comfortable sitting.  Does have some ecchymosis in both of his upper extremities which he relates to a new antihypertensive medicine.  Left knee was slightly warm and effused.  Not much pain.  Lacks a few degrees to full extension probably based on the effusion.  There was some popliteal prominence probably consistent with this popliteal cyst.  No calf pain. After knee aspiration and full extension and very minimal pain  Specialty Comments:  No specialty comments available.  Imaging: No results found.   PMFS History: Patient Active Problem List   Diagnosis Date Noted  . Primary osteoarthritis of left knee 11/08/2019  . Bilateral primary osteoarthritis of knee 09/28/2019  . HNP (herniated nucleus pulposus), lumbar 09/29/2017  . Right carotid bruit 06/10/2017  . Paresthesia 06/10/2017  . Left wrist pain 10/09/2016  .  Unilateral primary osteoarthritis, right knee 06/24/2016  . S/P total knee replacement using cement, right 06/24/2016  . Acute pain of left knee 03/27/2016  . HYPERLIPIDEMIA 01/18/2007  . HYPERTENSION 01/18/2007   Past Medical History:  Diagnosis Date  . Allergy   . Arthritis   . GERD (gastroesophageal reflux disease)    occ  . Heart murmur    baby  . High cholesterol   . Hypertension   . Pneumonia   . Right carotid bruit 06/10/2017  . Sciatica   . Seasonal allergies   . Sinus tachycardia   . Spinal stenosis     History reviewed. No pertinent family history.  Past Surgical History:  Procedure Laterality Date  . CARPAL TUNNEL WITH CUBITAL TUNNEL Left 11/10/2013   Procedure: LEFT  CARPAL TUNNEL RELEASE;  Surgeon: Cammie Sickle, MD;  Location: [facility];  Service: Orthopedics;  Laterality: Left;  . COLONOSCOPY    . KNEE ARTHROSCOPY  [id number]   left and right  . LUMBAR LAMINECTOMY/DECOMPRESSION MICRODISCECTOMY Left 09/29/2017   Procedure: LEFT LUMBAR TWO - LUMBAR THREELAMINOTOMY/MICRODISCECTOMY;  Surgeon: Jovita Gamma, MD;  Location: [facility];  Service: Neurosurgery;  Laterality: Left;  LEFT LUMBAR 2- LUMBAR 3 LAMINOTOMY/MICRODISCECTOMY  . NASAL SINUS SURGERY  05/2015  . TOTAL KNEE ARTHROPLASTY Right 06/24/2016   Procedure: TOTAL KNEE ARTHROPLASTY;  Surgeon: Garald Balding, MD;  Location: [facility];  Service: Orthopedics;  Laterality: Right;  . TRIGGER FINGER RELEASE Left 11/10/2013   Procedure: LEFT INDEX A-1 PULLEY RELEASE;  Surgeon: Cammie Sickle, MD;  Location: [facility];  Service: Orthopedics;  Laterality: Left;  . ULNAR NERVE TRANSPOSITION Left 11/10/2013   Procedure: ULNAR NERVE TRANSPOSITION;  Surgeon: Cammie Sickle, MD;  Location: [facility];  Service: Orthopedics;  Laterality: Left;   Social History   Occupational History  . Occupation: Self employed  Tobacco Use  . Smoking status: Former Smoker    Packs/day: 1.00    Years: 37.00    Pack years: 37.00    Types: Cigarettes    Quit date: 05/04/2016    Years since quitting: 3.5  . Smokeless tobacco: Never Used  Vaping Use  . Vaping Use: Former  Substance and Sexual Activity  . Alcohol use: Yes    Alcohol/week: 0.0 standard drinks    Comment: occ  . Drug use: No  . Sexual activity: Not on file     Garald Balding, MD   Note - This record has been created using [employer].  Chart creation errors have been sought, but may not always  have been located. Such creation errors do not reflect on  the standard of medical care.

## 2019-11-17 NOTE — Addendum Note (Signed)
Addended by: Minda Ditto, Geoffery Spruce on: 11/17/2019 03:46 PM   Modules accepted: Orders

## 2019-11-23 ENCOUNTER — Other Ambulatory Visit: Payer: Self-pay

## 2019-11-23 ENCOUNTER — Encounter: Payer: Self-pay | Admitting: Orthopaedic Surgery

## 2019-11-23 ENCOUNTER — Ambulatory Visit: Payer: BC Managed Care – PPO | Admitting: Orthopaedic Surgery

## 2019-11-23 VITALS — Ht 68.0 in | Wt 160.0 lb

## 2019-11-23 DIAGNOSIS — M1712 Unilateral primary osteoarthritis, left knee: Secondary | ICD-10-CM

## 2019-11-23 DIAGNOSIS — M170 Bilateral primary osteoarthritis of knee: Secondary | ICD-10-CM

## 2019-11-23 LAB — SYNOVIAL CELL COUNT + DIFF, W/ CRYSTALS
Basophils, %: 0 %
Eosinophils-Synovial: 0 % (ref 0–2)
Lymphocytes-Synovial Fld: 9 % (ref 0–74)
Monocyte/Macrophage: 16 % (ref 0–69)
Neutrophil, Synovial: 68 % — ABNORMAL HIGH (ref 0–24)
Synoviocytes, %: 10 % (ref 0–15)
WBC, Synovial: 14090 cells/uL — ABNORMAL HIGH (ref <150)

## 2019-11-23 LAB — ANAEROBIC AND AEROBIC CULTURE
AER RESULT:: NO GROWTH
MICRO NUMBER:: 10657971
MICRO NUMBER:: 10657972
SPECIMEN QUALITY:: ADEQUATE
SPECIMEN QUALITY:: ADEQUATE

## 2019-11-23 LAB — URIC ACID

## 2019-11-23 NOTE — Progress Notes (Addendum)
Office Visit Note   Patient: Philip Winters           Date of Birth: [birth year]           MRN: [id number] Visit Date: 11/23/2019              Requested by: Antony Contras, MD [address],  [place] [address] PCP: Antony Contras, MD   Assessment & Plan: Visit Diagnoses:  1. Bilateral primary osteoarthritis of knee   2. Primary osteoarthritis of left knee     Plan: [name] received an initial Synvisc injection in his left knee several weeks ago.  When I saw him last week he was having more pain and increased effusion.  I aspirated the knee and injected cortisone.  The fluid appeared to be consistent with CPPD.  Lab results did indicate calcium pyrophosphate crystals. he does have evidence of crystal formation within the menisci on x-ray.  He is feeling much better today but I think it is worth waiting at least 1 more week to inject the second Synvisc.  Also having a chronic history of problems with his back.  He had been seeing Dr. Sherwood Gambler who recently retired and has been told that he needs to wait to get another ESI for another month.  I suggested he would call Dr. Melven Sartorius office (who he has been seen in Dr. Donnella Bi absence) regarding referral to [facility] radiology for an injection if that would allow him an earlier injection  Follow-Up Instructions: Return in about 1 week (around 11/30/2019).   Orders:  No orders of the defined types were placed in this encounter.  No orders of the defined types were placed in this encounter.     Procedures: No procedures performed   Clinical Data: No additional findings.   Subjective: Chief Complaint  Patient presents with  . Left Knee - Follow-up    Synvisc started 11-08-2019  Patient presents today for the second injection of synvisc in his left knee. He started the series on 11-08-2019. Last week he had a lot of swelling and had his knee aspirated instead of receiving the second dose. He states that his knee is  feeling much better since last week and the swelling is gone.   HPI  Review of Systems   Objective: Vital Signs: Ht 5\' 8"  (1.727 m)   Wt 160 lb (72.6 kg)   BMI 24.33 kg/m   Physical Exam  Ortho Exam very minimal effusion left knee.  Incomplete extension based on his arthritis.  No instability.  Knee was not particularly warm.  Specialty Comments:  No specialty comments available.  Imaging: No results found.   PMFS History: Patient Active Problem List   Diagnosis Date Noted  . Primary osteoarthritis of left knee 11/08/2019  . Bilateral primary osteoarthritis of knee 09/28/2019  . HNP (herniated nucleus pulposus), lumbar 09/29/2017  . Right carotid bruit 06/10/2017  . Paresthesia 06/10/2017  . Left wrist pain 10/09/2016  . Unilateral primary osteoarthritis, right knee 06/24/2016  . S/P total knee replacement using cement, right 06/24/2016  . Acute pain of left knee 03/27/2016  . HYPERLIPIDEMIA 01/18/2007  . HYPERTENSION 01/18/2007   Past Medical History:  Diagnosis Date  . Allergy   . Arthritis   . GERD (gastroesophageal reflux disease)    occ  . Heart murmur    baby  . High cholesterol   . Hypertension   . Pneumonia   . Right carotid bruit 06/10/2017  .  Sciatica   . Seasonal allergies   . Sinus tachycardia   . Spinal stenosis     History reviewed. No pertinent family history.  Past Surgical History:  Procedure Laterality Date  . CARPAL TUNNEL WITH CUBITAL TUNNEL Left 11/10/2013   Procedure: LEFT CARPAL TUNNEL RELEASE;  Surgeon: Cammie Sickle, MD;  Location: [facility];  Service: Orthopedics;  Laterality: Left;  . COLONOSCOPY    . KNEE ARTHROSCOPY  [id number]   left and right  . LUMBAR LAMINECTOMY/DECOMPRESSION MICRODISCECTOMY Left 09/29/2017   Procedure: LEFT LUMBAR TWO - LUMBAR THREELAMINOTOMY/MICRODISCECTOMY;  Surgeon: Jovita Gamma, MD;  Location: [facility];  Service: Neurosurgery;  Laterality: Left;  LEFT LUMBAR 2- LUMBAR 3  LAMINOTOMY/MICRODISCECTOMY  . NASAL SINUS SURGERY  05/2015  . TOTAL KNEE ARTHROPLASTY Right 06/24/2016   Procedure: TOTAL KNEE ARTHROPLASTY;  Surgeon: Garald Balding, MD;  Location: [facility];  Service: Orthopedics;  Laterality: Right;  . TRIGGER FINGER RELEASE Left 11/10/2013   Procedure: LEFT INDEX A-1 PULLEY RELEASE;  Surgeon: Cammie Sickle, MD;  Location: [facility];  Service: Orthopedics;  Laterality: Left;  . ULNAR NERVE TRANSPOSITION Left 11/10/2013   Procedure: ULNAR NERVE TRANSPOSITION;  Surgeon: Cammie Sickle, MD;  Location: [facility];  Service: Orthopedics;  Laterality: Left;   Social History   Occupational History  . Occupation: Self employed  Tobacco Use  . Smoking status: Former Smoker    Packs/day: 1.00    Years: 37.00    Pack years: 37.00    Types: Cigarettes    Quit date: 05/04/2016    Years since quitting: 3.5  . Smokeless tobacco: Never Used  Vaping Use  . Vaping Use: Former  Substance and Sexual Activity  . Alcohol use: Yes    Alcohol/week: 0.0 standard drinks    Comment: occ  . Drug use: No  . Sexual activity: Not on file

## 2019-11-30 ENCOUNTER — Telehealth: Payer: Self-pay | Admitting: Orthopaedic Surgery

## 2019-11-30 ENCOUNTER — Other Ambulatory Visit: Payer: Self-pay | Admitting: Neurosurgery

## 2019-11-30 DIAGNOSIS — M48061 Spinal stenosis, lumbar region without neurogenic claudication: Secondary | ICD-10-CM

## 2019-11-30 NOTE — Telephone Encounter (Signed)
Please call patient

## 2019-11-30 NOTE — Telephone Encounter (Signed)
I called.

## 2019-11-30 NOTE — Telephone Encounter (Signed)
Pt would like a CB from Dr.Whitfield in regards to his back and approvals for injections.  [phone]

## 2019-12-01 ENCOUNTER — Telehealth: Payer: Self-pay

## 2019-12-01 NOTE — Telephone Encounter (Signed)
LMOM to inform patient his 13-hr prep has been called in to his [facility] in [place].  Prednisone 50mg  PO: 12/04/19 @ 1930; 12/05/19 @ 0730 and 1330. Benadryl 12/05/19 @ 1330.

## 2019-12-02 ENCOUNTER — Encounter: Payer: Self-pay | Admitting: Orthopaedic Surgery

## 2019-12-02 ENCOUNTER — Ambulatory Visit: Payer: BC Managed Care – PPO | Admitting: Orthopaedic Surgery

## 2019-12-02 ENCOUNTER — Other Ambulatory Visit: Payer: Self-pay

## 2019-12-02 DIAGNOSIS — M1712 Unilateral primary osteoarthritis, left knee: Secondary | ICD-10-CM

## 2019-12-02 DIAGNOSIS — M170 Bilateral primary osteoarthritis of knee: Secondary | ICD-10-CM

## 2019-12-02 MED ORDER — HYLAN G-F 20 16 MG/2ML IX SOSY
16.0000 mg | PREFILLED_SYRINGE | INTRA_ARTICULAR | Status: AC | PRN
  Administered 2019-12-02: 16 mg via INTRA_ARTICULAR

## 2019-12-02 NOTE — Progress Notes (Signed)
Office Visit Note   Patient: Philip Winters           Date of Birth: [birth year]           MRN: [id number] Visit Date: 12/02/2019              Requested by: Antony Contras, MD [address],  [place] [address] PCP: Antony Contras, MD   Assessment & Plan: Visit Diagnoses:  1. Bilateral primary osteoarthritis of knee   2. Primary osteoarthritis of left knee     Plan: Mr. Helfman has been followed for the osteoarthritis of his left knee.  He has had a successful right total knee replacement.  He had his first Synvisc injection several weeks ago in his left knee and developed an effusion with pain.  Aspiration revealed pseudogout crystals.  I injected cortisone note she is doing fine.  The concern is whether or not the Synvisc triggered the pseudogout.  After much discussion he would like to try another injection of Synvisc and will monitor his response over the next week.  Should he have further effusion we will just discontinue the the Synvisc.  He is aware that the definitive treatment would be knee replacement but he like to put that off as much as he can because of his work  Follow-Up Instructions: Return in about 1 week (around 12/09/2019).   Orders:  No orders of the defined types were placed in this encounter.  No orders of the defined types were placed in this encounter.     Procedures: Large Joint Inj: L knee on 12/02/2019 8:59 AM Indications: pain and joint swelling Details: 25 G 1.5 in needle, anteromedial approach  Arthrogram: No  Medications: 16 mg Hylan 16 MG/2ML Outcome: tolerated well, no immediate complications Procedure, treatment alternatives, risks and benefits explained, specific risks discussed. Consent was given by the patient. Immediately prior to procedure a time out was called to verify the correct patient, procedure, equipment, support staff and site/side marked as required. Patient was prepped and draped in the usual sterile fashion.        Clinical Data: No additional findings.   Subjective: Chief Complaint  Patient presents with  . Left Knee - Pain  Presently not having any significant problems with his left knee after initial injection of the Synvisc several weeks ago with development of pseudogout effusion based on lab analysis of the fluid.  Spent several weeks since I injected his knee with cortisone is back to baseline.  HPI  Review of Systems   Objective: Vital Signs: There were no vitals taken for this visit.  Physical Exam  Ortho Exam left knee with minimal effusion.  knee was not hot ,red or swollen.  Increased varus which is not new.  Lacks just a few degrees to full extension and flexed over 105 degrees without instability.  Mild medial joint pain  Specialty Comments:  No specialty comments available.  Imaging: No results found.   PMFS History: Patient Active Problem List   Diagnosis Date Noted  . Primary osteoarthritis of left knee 11/08/2019  . Bilateral primary osteoarthritis of knee 09/28/2019  . HNP (herniated nucleus pulposus), lumbar 09/29/2017  . Right carotid bruit 06/10/2017  . Paresthesia 06/10/2017  . Left wrist pain 10/09/2016  . Unilateral primary osteoarthritis, right knee 06/24/2016  . S/P total knee replacement using cement, right 06/24/2016  . Acute pain of left knee 03/27/2016  . HYPERLIPIDEMIA 01/18/2007  . HYPERTENSION 01/18/2007  Past Medical History:  Diagnosis Date  . Allergy   . Arthritis   . GERD (gastroesophageal reflux disease)    occ  . Heart murmur    baby  . High cholesterol   . Hypertension   . Pneumonia   . Right carotid bruit 06/10/2017  . Sciatica   . Seasonal allergies   . Sinus tachycardia   . Spinal stenosis     History reviewed. No pertinent family history.  Past Surgical History:  Procedure Laterality Date  . CARPAL TUNNEL WITH CUBITAL TUNNEL Left 11/10/2013   Procedure: LEFT CARPAL TUNNEL RELEASE;  Surgeon: Cammie Sickle, MD;  Location: [facility];  Service: Orthopedics;  Laterality: Left;  . COLONOSCOPY    . KNEE ARTHROSCOPY  [id number]   left and right  . LUMBAR LAMINECTOMY/DECOMPRESSION MICRODISCECTOMY Left 09/29/2017   Procedure: LEFT LUMBAR TWO - LUMBAR THREELAMINOTOMY/MICRODISCECTOMY;  Surgeon: Jovita Gamma, MD;  Location: [facility];  Service: Neurosurgery;  Laterality: Left;  LEFT LUMBAR 2- LUMBAR 3 LAMINOTOMY/MICRODISCECTOMY  . NASAL SINUS SURGERY  05/2015  . TOTAL KNEE ARTHROPLASTY Right 06/24/2016   Procedure: TOTAL KNEE ARTHROPLASTY;  Surgeon: Garald Balding, MD;  Location: [facility];  Service: Orthopedics;  Laterality: Right;  . TRIGGER FINGER RELEASE Left 11/10/2013   Procedure: LEFT INDEX A-1 PULLEY RELEASE;  Surgeon: Cammie Sickle, MD;  Location: [facility];  Service: Orthopedics;  Laterality: Left;  . ULNAR NERVE TRANSPOSITION Left 11/10/2013   Procedure: ULNAR NERVE TRANSPOSITION;  Surgeon: Cammie Sickle, MD;  Location: [facility];  Service: Orthopedics;  Laterality: Left;   Social History   Occupational History  . Occupation: Self employed  Tobacco Use  . Smoking status: Former Smoker    Packs/day: 1.00    Years: 37.00    Pack years: 37.00    Types: Cigarettes    Quit date: 05/04/2016    Years since quitting: 3.5  . Smokeless tobacco: Never Used  Vaping Use  . Vaping Use: Former  Substance and Sexual Activity  . Alcohol use: Yes    Alcohol/week: 0.0 standard drinks    Comment: occ  . Drug use: No  . Sexual activity: Not on file     Garald Balding, MD   Note - This record has been created using [employer].  Chart creation errors have been sought, but may not always  have been located. Such creation errors do not reflect on  the standard of medical care.

## 2019-12-05 ENCOUNTER — Ambulatory Visit
Admission: RE | Admit: 2019-12-05 | Discharge: 2019-12-05 | Disposition: A | Discharge status: Home / Self Care | Payer: BC Managed Care – PPO | Source: Ambulatory Visit | Attending: Neurosurgery | Admitting: Neurosurgery

## 2019-12-05 ENCOUNTER — Telehealth: Payer: Self-pay

## 2019-12-05 ENCOUNTER — Telehealth: Payer: Self-pay | Admitting: Orthopaedic Surgery

## 2019-12-05 ENCOUNTER — Other Ambulatory Visit: Payer: Self-pay

## 2019-12-05 DIAGNOSIS — M545 Low back pain: Secondary | ICD-10-CM | POA: Diagnosis not present

## 2019-12-05 DIAGNOSIS — M48061 Spinal stenosis, lumbar region without neurogenic claudication: Secondary | ICD-10-CM

## 2019-12-05 IMAGING — XA DG EPIDURAL NERVE ROOT
2 series · 2 of 2 positions shown · non-contrast
Comparison: none

CLINICAL DATA: Lumbosacral spondylosis without myelopathy. Chronic
low back pain extending into the left groin and anterior thigh.
Moderate spinal canal and left neuroforaminal stenosis at L3-L4.
Good relief from prior epidural injections at outside facility. Left
L3-L4 transforaminal injection requested today.

[Series 1: ortho adipose · 1 of 1 slices shown (1 of 2)]
[im 1/1]
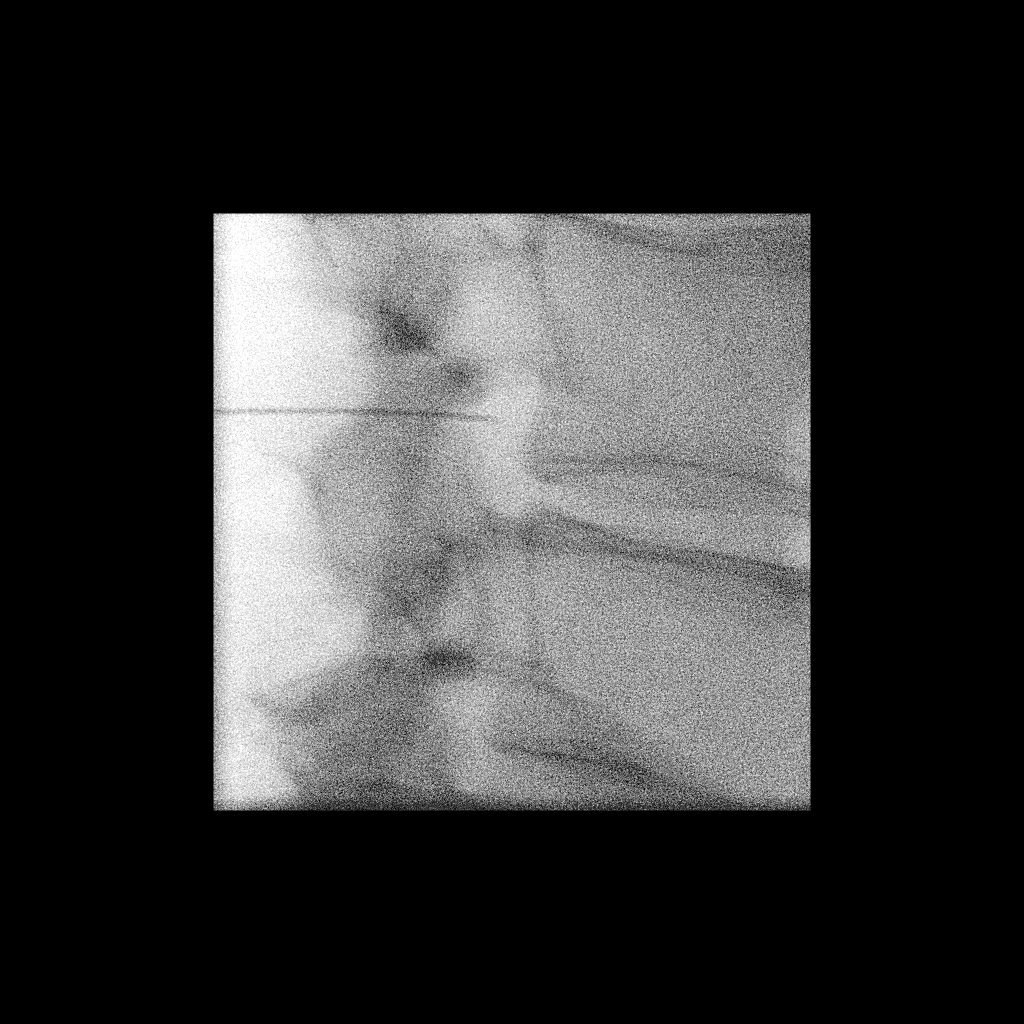

[Series 2: ortho adipose · 1 of 1 slices shown (2 of 2)]
[im 1/1]
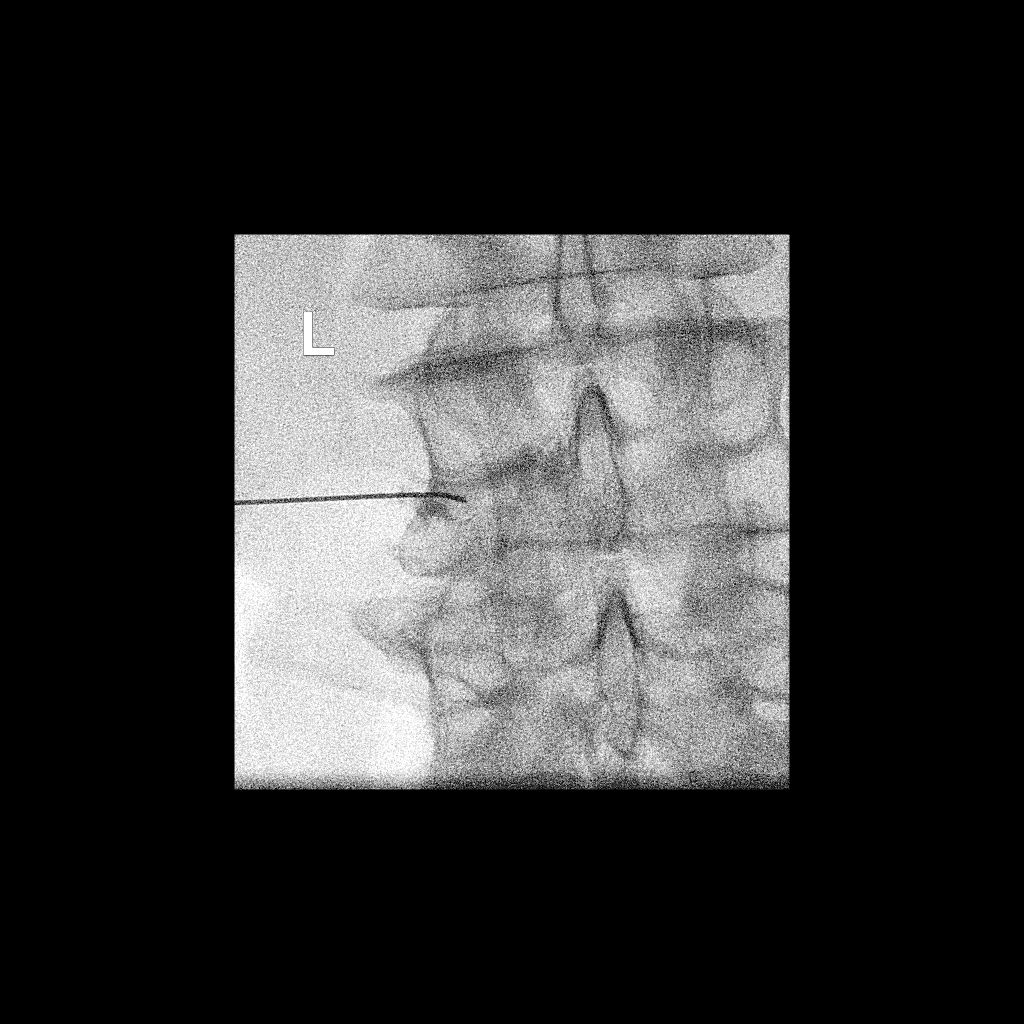

[2 of 2 positions shown; findings below may reference images not displayed]

EXAM:
EPIDURAL/NERVE ROOT

FLUOROSCOPY TIME:  Radiation Exposure Index (as provided by the
fluoroscopic device): 3.6 mGy

Fluoroscopy Time:  28 seconds

Number of Acquired Images:  0

PROCEDURE:
The procedure, risks, benefits, and alternatives were explained to
the patient. Questions regarding the procedure were encouraged and
answered. The patient understands and consents to the procedure.

LEFT L3 NERVE ROOT BLOCK AND TRANSFORAMINAL EPIDURAL: A posterior
oblique approach was taken to the intervertebral foramen on the left
at L3-L4 using a curved 3.5 inch 22 gauge spinal needle. Injection
of Isovue M 200 outlined the left L3 nerve root and showed good
epidural spread. No vascular opacification is seen. 120 mg of
Depo-Medrol mixed with 2 mL of 1% lidocaine were instilled. The
procedure was well-tolerated, and the patient was discharged thirty
minutes following the injection in good condition.

COMPLICATIONS:
None immediate.
IMPRESSION: Technically successful injection consisting of a left L3 nerve root
block and transforaminal epidural.

## 2019-12-05 MED ORDER — METHYLPREDNISOLONE ACETATE 40 MG/ML INJ SUSP (RADIOLOG
120.0000 mg | Freq: Once | INTRAMUSCULAR | Status: AC
  Administered 2019-12-05: 120 mg via EPIDURAL

## 2019-12-05 MED ORDER — IOPAMIDOL (ISOVUE-M 200) INJECTION 41%
1.0000 mL | Freq: Once | INTRAMUSCULAR | Status: AC
  Administered 2019-12-05: 1 mL via EPIDURAL

## 2019-12-05 NOTE — Telephone Encounter (Signed)
Not sure he can  get much relief without aspiration and cortisone injection but could try tramadol.Ice and maybe a brace if he has one at home

## 2019-12-05 NOTE — Telephone Encounter (Signed)
Pt called stating he got an injection on 12/02/19 and  His leg has been swollen all weekend; I set pt an appt for Dr.Whitfields next available 12/07/19 and still let him speak with triage.

## 2019-12-05 NOTE — Discharge Instructions (Signed)

## 2019-12-05 NOTE — Telephone Encounter (Signed)
Please advise 

## 2019-12-05 NOTE — Telephone Encounter (Signed)
Spoke with patient. Relayed information. ?

## 2019-12-05 NOTE — Telephone Encounter (Signed)
Patient called stated he had a 2nd gel injection on Friday and seem to have had an allergic reaction to this. He said that his knee is extremely swollen. 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

## 2019-12-06 ENCOUNTER — Telehealth: Payer: Self-pay

## 2019-12-06 ENCOUNTER — Encounter: Payer: Self-pay | Admitting: Orthopaedic Surgery

## 2019-12-06 ENCOUNTER — Ambulatory Visit: Payer: BC Managed Care – PPO | Admitting: Orthopaedic Surgery

## 2019-12-06 DIAGNOSIS — M170 Bilateral primary osteoarthritis of knee: Secondary | ICD-10-CM

## 2019-12-06 DIAGNOSIS — M25562 Pain in left knee: Secondary | ICD-10-CM

## 2019-12-06 MED ORDER — LIDOCAINE HCL 1 % IJ SOLN
2.0000 mL | INTRAMUSCULAR | Status: AC | PRN
  Administered 2019-12-06: 2 mL

## 2019-12-06 MED ORDER — METHYLPREDNISOLONE ACETATE 40 MG/ML IJ SUSP
80.0000 mg | INTRAMUSCULAR | Status: AC | PRN
  Administered 2019-12-06: 80 mg via INTRA_ARTICULAR

## 2019-12-06 MED ORDER — BUPIVACAINE HCL 0.5 % IJ SOLN
2.0000 mL | INTRAMUSCULAR | Status: AC | PRN
  Administered 2019-12-06: 2 mL via INTRA_ARTICULAR

## 2019-12-06 NOTE — Telephone Encounter (Signed)
So, I would have to get approval for Gelsyn-3 under his insurance and let them know that patient had an reaction to synvisc.  You would also need to call [phone] MySynvisc and let them know that patient had an reaction to Synvisc.  Please let me know if you need anything. Thank you.

## 2019-12-06 NOTE — Telephone Encounter (Signed)
I talked with [facility].  They are aware that patient had a reaction to Synvisc. Already submitted for Gelsyn-3. Thank you.

## 2019-12-06 NOTE — Progress Notes (Signed)
Office Visit Note   Patient: Philip Winters           Date of Birth: 1960-05-29           MRN: [id number] Visit Date: 12/06/2019              Requested by: Antony Contras, MD [address] [place],  [place] [address] PCP: Antony Contras, MD   Assessment & Plan: Visit Diagnoses:  1. Acute pain of left knee   2. Bilateral primary osteoarthritis of knee     Plan: Mr. Schreck received his first Synvisc injection in the left knee several weeks ago.  He had a reaction to the injection with his knee aspirate consistent with CPPD.  I injected his knee with cortisone with resolution of the problem.  We had discussed trying it again to see if it was a random reaction.  I reinjected his knee with the second Synvisc last week and he had a similar reaction but not quite as severe so I repeated the aspiration today of only 10 cc of fluid is slightly cloudy and reinjected his knee.  I think the problem is with the Synvisc and not necessarily with any Visco product.  Long discussion with him.  Will check with the insurance company regarding use of a different Visco product and discussed with him again awaiting approval.  He is completely comfortable with the above.  We will check him back in a month or less  Follow-Up Instructions: Return in about 1 month (around 01/06/2020).   Orders:  Orders Placed This Encounter  Procedures  . Large Joint Inj: L knee   No orders of the defined types were placed in this encounter.     Procedures: Large Joint Inj: L knee on 12/06/2019 1:04 PM Indications: pain and diagnostic evaluation Details: 25 G 1.5 in needle, anteromedial approach  Arthrogram: No  Medications: 2 mL lidocaine 1 %; 2 mL bupivacaine 0.5 %; 80 mg methylPREDNISolone acetate 40 MG/ML Aspirate: 10 mL cloudy and yellow Outcome: tolerated well, no immediate complications Procedure, treatment alternatives, risks and benefits explained, specific risks discussed. Consent was given by the  patient. Patient was prepped and draped in the usual sterile fashion.       Clinical Data: No additional findings.   Subjective: No chief complaint on file. Synvisc injection left knee was performed the end of last week with a similar reaction to the first.  He had increased knee effusion.  I called his on tramadol for him yesterday and had him come to the office today.  His knee was not nearly as hot or swollen but it appears that he has had another reaction to the Synvisc HPI  Review of Systems   Objective: Vital Signs: There were no vitals taken for this visit.  Physical Exam  Ortho Exam left knee lacked a few degrees to full extension and flex to 105 degrees without instability.  The knee was warm but not hot and there was a small effusion.  Not much medial lateral joint pain no distal edema.  No popliteal pain or calf discomfort  Specialty Comments:  No specialty comments available.  Imaging: DG Epidural/Nerve Root  Result Date: 12/05/2019 CLINICAL DATA:  Lumbosacral spondylosis without myelopathy. Chronic low back pain extending into the left groin and anterior thigh. Moderate spinal canal and left neuroforaminal stenosis at L3-L4. Good relief from prior epidural injections at outside facility. Left L3-L4 transforaminal injection requested today. EXAM: EPIDURAL/NERVE ROOT FLUOROSCOPY  TIME:  Radiation Exposure Index (as provided by the fluoroscopic device): 3.6 mGy Fluoroscopy Time:  28 seconds Number of Acquired Images:  0 PROCEDURE: The procedure, risks, benefits, and alternatives were explained to the patient. Questions regarding the procedure were encouraged and answered. The patient understands and consents to the procedure. LEFT L3 NERVE ROOT BLOCK AND TRANSFORAMINAL EPIDURAL: A posterior oblique approach was taken to the intervertebral foramen on the left at L3-L4 using a curved 3.5 inch 22 gauge spinal needle. Injection of Isovue M 200 outlined the left L3 nerve root and  showed good epidural spread. No vascular opacification is seen. 120 mg of Depo-Medrol mixed with 2 mL of 1% lidocaine were instilled. The procedure was well-tolerated, and the patient was discharged thirty minutes following the injection in good condition. COMPLICATIONS: None immediate. IMPRESSION: Technically successful injection consisting of a left L3 nerve root block and transforaminal epidural. Electronically Signed   By: Titus Dubin M.D.   On: 12/05/2019 15:56     PMFS History: Patient Active Problem List   Diagnosis Date Noted  . Primary osteoarthritis of left knee 11/08/2019  . Bilateral primary osteoarthritis of knee 09/28/2019  . HNP (herniated nucleus pulposus), lumbar 09/29/2017  . Right carotid bruit 06/10/2017  . Paresthesia 06/10/2017  . Left wrist pain 10/09/2016  . Unilateral primary osteoarthritis, right knee 06/24/2016  . S/P total knee replacement using cement, right 06/24/2016  . Acute pain of left knee 03/27/2016  . HYPERLIPIDEMIA 01/18/2007  . HYPERTENSION 01/18/2007   Past Medical History:  Diagnosis Date  . Allergy   . Arthritis   . GERD (gastroesophageal reflux disease)    occ  . Heart murmur    baby  . High cholesterol   . Hypertension   . Pneumonia   . Right carotid bruit 06/10/2017  . Sciatica   . Seasonal allergies   . Sinus tachycardia   . Spinal stenosis     History reviewed. No pertinent family history.  Past Surgical History:  Procedure Laterality Date  . CARPAL TUNNEL WITH CUBITAL TUNNEL Left 11/10/2013   Procedure: LEFT CARPAL TUNNEL RELEASE;  Surgeon: Cammie Sickle, MD;  Location: [facility];  Service: Orthopedics;  Laterality: Left;  . COLONOSCOPY    . KNEE ARTHROSCOPY  [id number]   left and right  . LUMBAR LAMINECTOMY/DECOMPRESSION MICRODISCECTOMY Left 09/29/2017   Procedure: LEFT LUMBAR TWO - LUMBAR THREELAMINOTOMY/MICRODISCECTOMY;  Surgeon: Jovita Gamma, MD;  Location: [facility];  Service: Neurosurgery;   Laterality: Left;  LEFT LUMBAR 2- LUMBAR 3 LAMINOTOMY/MICRODISCECTOMY  . NASAL SINUS SURGERY  05/2015  . TOTAL KNEE ARTHROPLASTY Right 06/24/2016   Procedure: TOTAL KNEE ARTHROPLASTY;  Surgeon: Garald Balding, MD;  Location: [facility];  Service: Orthopedics;  Laterality: Right;  . TRIGGER FINGER RELEASE Left 11/10/2013   Procedure: LEFT INDEX A-1 PULLEY RELEASE;  Surgeon: Cammie Sickle, MD;  Location: [facility];  Service: Orthopedics;  Laterality: Left;  . ULNAR NERVE TRANSPOSITION Left 11/10/2013   Procedure: ULNAR NERVE TRANSPOSITION;  Surgeon: Cammie Sickle, MD;  Location: [facility];  Service: Orthopedics;  Laterality: Left;   Social History   Occupational History  . Occupation: Self employed  Tobacco Use  . Smoking status: Former Smoker    Packs/day: 1.00    Years: 37.00    Pack years: 37.00    Types: Cigarettes    Quit date: 05/04/2016    Years since quitting: 3.5  . Smokeless tobacco: Never Used  Vaping Use  . Vaping Use: Former  Substance and Sexual Activity  . Alcohol use: Yes    Alcohol/week: 0.0 standard drinks    Comment: occ  . Drug use: No  . Sexual activity: Not on file     Garald Balding, MD   Note - This record has been created using [employer].  Chart creation errors have been sought, but may not always  have been located. Such creation errors do not reflect on  the standard of medical care.

## 2019-12-06 NOTE — Telephone Encounter (Signed)
Patient has received two synvisc injections and he has had a reaction to each of those. Patient cannot finish the series. Mr.Eastham wants to know if insurance will approve him to try another type of visco injection since he cannot take the Synvisc? Thanks!

## 2019-12-06 NOTE — Telephone Encounter (Signed)
Submitted for Gelsyn-3, left knee.  Talked with [facility] concerning reaction that patient had with Synvisc series for left knee.

## 2019-12-06 NOTE — Telephone Encounter (Signed)
Okay thanks! Please try and get approval for Gelsyn-3, per Dr.Whitfield and patient's request. Do I call MySynvisc or Dr.Whitfield?

## 2019-12-07 ENCOUNTER — Ambulatory Visit: Payer: BC Managed Care – PPO | Admitting: Orthopaedic Surgery

## 2019-12-08 ENCOUNTER — Ambulatory Visit: Payer: BC Managed Care – PPO | Admitting: Orthopaedic Surgery

## 2019-12-09 ENCOUNTER — Telehealth: Payer: Self-pay

## 2019-12-09 NOTE — Telephone Encounter (Signed)
PA required for Gelsyn-3, left knee PA has been submitted to patient's plan per online portal on 12/07/2019.

## 2019-12-16 ENCOUNTER — Telehealth: Payer: Self-pay

## 2019-12-16 NOTE — Telephone Encounter (Signed)
Approved, Gelsyn-3 series, left knee. Buy & Bill Covered at 100% of the allowable amount. Co-pay of $40.00 per date of service PA required PA Approval# [facility] Valid 12/13/2019-06/13/2020

## 2019-12-29 ENCOUNTER — Other Ambulatory Visit: Payer: Self-pay

## 2019-12-29 ENCOUNTER — Ambulatory Visit: Payer: BC Managed Care – PPO | Admitting: Orthopaedic Surgery

## 2019-12-29 ENCOUNTER — Encounter: Payer: Self-pay | Admitting: Orthopaedic Surgery

## 2019-12-29 VITALS — Ht 68.0 in | Wt 160.0 lb

## 2019-12-29 DIAGNOSIS — M1712 Unilateral primary osteoarthritis, left knee: Secondary | ICD-10-CM

## 2019-12-29 MED ORDER — LIDOCAINE HCL 1 % IJ SOLN
2.0000 mL | INTRAMUSCULAR | Status: AC | PRN
  Administered 2019-12-29: 2 mL

## 2019-12-29 NOTE — Progress Notes (Signed)
Office Visit Note   Patient: Philip Winters           Date of Birth: 06-20-1960           MRN: [id number] Visit Date: 12/29/2019              Requested by: Antony Contras, MD [address] [place],  [place] [address] PCP: Antony Contras, MD   Assessment & Plan: Visit Diagnoses:  1. Primary osteoarthritis of left knee     Plan:  #1: The first injection of Gelsyn was given without difficulty. #2: Follow back up in 1 week for the second injection.  If he does have a reaction the we will aspirated inject cortisone and stop the series.  Follow-Up Instructions: Return in about 1 week (around 01/05/2020).   Orders:  Orders Placed This Encounter  Procedures  . Large Joint Inj: L knee   No orders of the defined types were placed in this encounter.     Procedures: Large Joint Inj: L knee on 12/29/2019 3:54 PM Indications: pain and joint swelling Details: 25 G 1.5 in needle, anteromedial approach  Arthrogram: No  Medications: 2 mL lidocaine 1 % Outcome: tolerated well, no immediate complications  GELSYN VISCO SUPPLEMENTATION Procedure, treatment alternatives, risks and benefits explained, specific risks discussed. Consent was given by the patient. Immediately prior to procedure a time out was called to verify the correct patient, procedure, equipment, support staff and site/side marked as required. Patient was prepped and draped in the usual sterile fashion.       Clinical Data: No additional findings.   Subjective: Chief Complaint  Patient presents with  . Left Knee - Follow-up    Gelsyn series started 12/29/2019  Patient presents today for the first Gelsyn injection in his left knee.   HPI  4 presents today for his first injection to his left knee of a new product called Gelsyn.  He has had problems with the other previous viscosupplementation's.  Therefore this was a new 1 for him to try.  Review of Systems  Constitutional: Negative for fatigue.  HENT:  Negative for ear pain.   Eyes: Negative for pain.  Respiratory: Negative for shortness of breath.   Cardiovascular: Negative for leg swelling.  Gastrointestinal: Negative for constipation and diarrhea.  Endocrine: Negative for cold intolerance and heat intolerance.  Genitourinary: Negative for difficulty urinating.  Musculoskeletal: Positive for joint swelling.  Skin: Negative for rash.  Allergic/Immunologic: Positive for food allergies.  Neurological: Negative for weakness.  Hematological: Does not bruise/bleed easily.  Psychiatric/Behavioral: Negative for sleep disturbance.     Objective: Vital Signs: Ht 5\' 8"  (1.727 m)   Wt 160 lb (72.6 kg)   BMI 24.33 kg/m   Physical Exam Constitutional:      Appearance: Normal appearance. He is well-developed and normal weight.  HENT:     Head: Normocephalic.  Eyes:     Pupils: Pupils are equal, round, and reactive to light.  Pulmonary:     Effort: Pulmonary effort is normal.  Skin:    General: Skin is warm and dry.  Neurological:     Mental Status: He is alert and oriented to person, place, and time.  Psychiatric:        Behavior: Behavior normal.     Ortho Exam  Exam today reveals knee with minimal effusion.  He does have loss of full extension by several degrees.  No warmth or erythema.  Specialty Comments:  No specialty comments  available.  Imaging: No results found.   PMFS History: Current Outpatient Medications  Medication Sig Dispense Refill  . acetaminophen (TYLENOL) 500 MG tablet Take 500 mg by mouth as needed.    Marland Kitchen aspirin EC 81 MG tablet Take 81 mg by mouth daily.    Marland Kitchen diltiazem (CARDIZEM CD) 120 MG 24 hr capsule Take 1 capsule (120 mg total) by mouth daily. 90 capsule 3  . diphenhydrAMINE (BENADRYL) 25 MG tablet Take 25 mg by mouth at bedtime as needed for allergies.    . fluticasone (FLONASE ALLERGY RELIEF) 50 MCG/ACT nasal spray as needed.    . Multiple Vitamin (MULTIVITAMIN WITH MINERALS) TABS tablet Take  1 tablet by mouth daily.    Marland Kitchen olmesartan (BENICAR) 40 MG tablet Take 40 mg by mouth daily.    Marland Kitchen omeprazole (PRILOSEC) 20 MG capsule Take 20 mg by mouth as needed.    Marland Kitchen tetrahydrozoline 0.05 % ophthalmic solution Place 1 drop into both eyes 3 (three) times daily as needed (for dry/irritated eyes.).    Marland Kitchen traMADol (ULTRAM) 50 MG tablet TK 1 T PO  Q 6 HOURS PRN     No current facility-administered medications for this visit.    Patient Active Problem List   Diagnosis Date Noted  . Primary osteoarthritis of left knee 11/08/2019  . Bilateral primary osteoarthritis of knee 09/28/2019  . HNP (herniated nucleus pulposus), lumbar 09/29/2017  . Right carotid bruit 06/10/2017  . Paresthesia 06/10/2017  . Left wrist pain 10/09/2016  . Unilateral primary osteoarthritis, right knee 06/24/2016  . S/P total knee replacement using cement, right 06/24/2016  . Acute pain of left knee 03/27/2016  . HYPERLIPIDEMIA 01/18/2007  . HYPERTENSION 01/18/2007   Past Medical History:  Diagnosis Date  . Allergy   . Arthritis   . GERD (gastroesophageal reflux disease)    occ  . Heart murmur    baby  . High cholesterol   . Hypertension   . Pneumonia   . Right carotid bruit 06/10/2017  . Sciatica   . Seasonal allergies   . Sinus tachycardia   . Spinal stenosis     History reviewed. No pertinent family history.  Past Surgical History:  Procedure Laterality Date  . CARPAL TUNNEL WITH CUBITAL TUNNEL Left 11/10/2013   Procedure: LEFT CARPAL TUNNEL RELEASE;  Surgeon: Cammie Sickle, MD;  Location: [facility];  Service: Orthopedics;  Laterality: Left;  . COLONOSCOPY    . KNEE ARTHROSCOPY  [id number]   left and right  . LUMBAR LAMINECTOMY/DECOMPRESSION MICRODISCECTOMY Left 09/29/2017   Procedure: LEFT LUMBAR TWO - LUMBAR THREELAMINOTOMY/MICRODISCECTOMY;  Surgeon: Jovita Gamma, MD;  Location: [facility];  Service: Neurosurgery;  Laterality: Left;  LEFT LUMBAR 2- LUMBAR 3  LAMINOTOMY/MICRODISCECTOMY  . NASAL SINUS SURGERY  05/2015  . TOTAL KNEE ARTHROPLASTY Right 06/24/2016   Procedure: TOTAL KNEE ARTHROPLASTY;  Surgeon: Garald Balding, MD;  Location: [facility];  Service: Orthopedics;  Laterality: Right;  . TRIGGER FINGER RELEASE Left 11/10/2013   Procedure: LEFT INDEX A-1 PULLEY RELEASE;  Surgeon: Cammie Sickle, MD;  Location: [facility];  Service: Orthopedics;  Laterality: Left;  . ULNAR NERVE TRANSPOSITION Left 11/10/2013   Procedure: ULNAR NERVE TRANSPOSITION;  Surgeon: Cammie Sickle, MD;  Location: [facility];  Service: Orthopedics;  Laterality: Left;   Social History   Occupational History  . Occupation: Self employed  Tobacco Use  . Smoking status: Former Smoker    Packs/day: 1.00  Years: 37.00    Pack years: 37.00    Types: Cigarettes    Quit date: 05/04/2016    Years since quitting: 3.6  . Smokeless tobacco: Never Used  Vaping Use  . Vaping Use: Former  Substance and Sexual Activity  . Alcohol use: Yes    Alcohol/week: 0.0 standard drinks    Comment: occ  . Drug use: No  . Sexual activity: Not on file

## 2020-01-03 ENCOUNTER — Encounter: Payer: Self-pay | Admitting: Orthopedic Surgery

## 2020-01-03 ENCOUNTER — Ambulatory Visit: Payer: BC Managed Care – PPO | Admitting: Orthopedic Surgery

## 2020-01-03 ENCOUNTER — Other Ambulatory Visit: Payer: Self-pay

## 2020-01-03 VITALS — Ht 68.0 in | Wt 160.0 lb

## 2020-01-03 DIAGNOSIS — M1712 Unilateral primary osteoarthritis, left knee: Secondary | ICD-10-CM

## 2020-01-03 MED ORDER — LIDOCAINE HCL 1 % IJ SOLN
2.0000 mL | INTRAMUSCULAR | Status: AC | PRN
  Administered 2020-01-03: 2 mL

## 2020-01-03 NOTE — Progress Notes (Signed)
Office Visit Note   Patient: Philip Winters           Date of Birth: 28-Jan-1961           MRN: [id number] Visit Date: 01/03/2020              Requested by: Antony Contras, MD [place],  [place] [id number] PCP: Antony Contras, MD   Assessment & Plan: Visit Diagnoses:  1. Primary osteoarthritis of left knee     Plan:  #1: The second Gelsyn was given to the left knee without difficulty.  Tolerated the procedure well. #2: Follow back up 1 week for his third injection.  Follow-Up Instructions: No follow-ups on file.   Orders:  No orders of the defined types were placed in this encounter.  No orders of the defined types were placed in this encounter.     Procedures: Large Joint Inj: L knee on 01/03/2020 12:32 PM Indications: pain and joint swelling Details: 25 G 1.5 in needle, anteromedial approach  Arthrogram: No  Medications: 2 mL lidocaine 1 % Outcome: tolerated well, no immediate complications  Gelsyn Procedure, treatment alternatives, risks and benefits explained, specific risks discussed. Consent was given by the patient. Immediately prior to procedure a time out was called to verify the correct patient, procedure, equipment, support staff and site/side marked as required. Patient was prepped and draped in the usual sterile fashion.       Clinical Data: No additional findings.   Subjective: Chief Complaint  Patient presents with  . Left Knee - Follow-up    Gelsyn started 12/29/2019   HPI Patient presents today for the second Gelsyn injection in his left knee. He started the series on 12/29/2019. Patient states that he has had no complications or adverse reactions to the Moosic, as he did the Synvisc. He wishes to proceed on with the second injection today.    Review of Systems  Constitutional: Negative for fatigue.  HENT: Negative for ear pain.   Eyes: Negative for pain.  Respiratory: Negative for shortness of breath.     Cardiovascular: Negative for leg swelling.  Gastrointestinal: Negative for constipation and diarrhea.  Endocrine: Negative for cold intolerance and heat intolerance.  Genitourinary: Negative for difficulty urinating.  Musculoskeletal: Positive for joint swelling.  Skin: Negative for rash.  Allergic/Immunologic: Negative for food allergies.  Neurological: Negative for weakness.  Hematological: Does not bruise/bleed easily.  Psychiatric/Behavioral: Negative for sleep disturbance.     Objective: Vital Signs: Ht 5\' 8"  (1.727 m)   Wt 160 lb (72.6 kg)   BMI 24.33 kg/m   Physical Exam  Ortho Exam  Exam today reveals the left knee to be present benign.  Mild effusion.  No warmth or erythema.  Specialty Comments:  No specialty comments available.  Imaging: No results found.   PMFS History: Current Outpatient Medications  Medication Sig Dispense Refill  . acetaminophen (TYLENOL) 500 MG tablet Take 500 mg by mouth as needed.    Marland Kitchen aspirin EC 81 MG tablet Take 81 mg by mouth daily.    Marland Kitchen diltiazem (CARDIZEM CD) 120 MG 24 hr capsule Take 1 capsule (120 mg total) by mouth daily. 90 capsule 3  . diphenhydrAMINE (BENADRYL) 25 MG tablet Take 25 mg by mouth at bedtime as needed for allergies.    . fluticasone (FLONASE ALLERGY RELIEF) 50 MCG/ACT nasal spray as needed.    . Multiple Vitamin (MULTIVITAMIN WITH MINERALS) TABS tablet Take 1 tablet by mouth daily.    Marland Kitchen  olmesartan (BENICAR) 40 MG tablet Take 40 mg by mouth daily.    Marland Kitchen omeprazole (PRILOSEC) 20 MG capsule Take 20 mg by mouth as needed.    Marland Kitchen tetrahydrozoline 0.05 % ophthalmic solution Place 1 drop into both eyes 3 (three) times daily as needed (for dry/irritated eyes.).    Marland Kitchen traMADol (ULTRAM) 50 MG tablet TK 1 T PO  Q 6 HOURS PRN     No current facility-administered medications for this visit.    Patient Active Problem List   Diagnosis Date Noted  . Primary osteoarthritis of left knee 11/08/2019  . Bilateral primary  osteoarthritis of knee 09/28/2019  . HNP (herniated nucleus pulposus), lumbar 09/29/2017  . Right carotid bruit 06/10/2017  . Paresthesia 06/10/2017  . Left wrist pain 10/09/2016  . Unilateral primary osteoarthritis, right knee 06/24/2016  . S/P total knee replacement using cement, right 06/24/2016  . Acute pain of left knee 03/27/2016  . HYPERLIPIDEMIA 01/18/2007  . HYPERTENSION 01/18/2007   Past Medical History:  Diagnosis Date  . Allergy   . Arthritis   . GERD (gastroesophageal reflux disease)    occ  . Heart murmur    baby  . High cholesterol   . Hypertension   . Pneumonia   . Right carotid bruit 06/10/2017  . Sciatica   . Seasonal allergies   . Sinus tachycardia   . Spinal stenosis     History reviewed. No pertinent family history.  Past Surgical History:  Procedure Laterality Date  . CARPAL TUNNEL WITH CUBITAL TUNNEL Left 11/10/2013   Procedure: LEFT CARPAL TUNNEL RELEASE;  Surgeon: Cammie Sickle, MD;  Location: [place];  Service: Orthopedics;  Laterality: Left;  . COLONOSCOPY    . KNEE ARTHROSCOPY  [id number]   left and right  . LUMBAR LAMINECTOMY/DECOMPRESSION MICRODISCECTOMY Left 09/29/2017   Procedure: LEFT LUMBAR TWO - LUMBAR THREELAMINOTOMY/MICRODISCECTOMY;  Surgeon: Jovita Gamma, MD;  Location: [place];  Service: Neurosurgery;  Laterality: Left;  LEFT LUMBAR 2- LUMBAR 3 LAMINOTOMY/MICRODISCECTOMY  . NASAL SINUS SURGERY  05/2015  . TOTAL KNEE ARTHROPLASTY Right 06/24/2016   Procedure: TOTAL KNEE ARTHROPLASTY;  Surgeon: Garald Balding, MD;  Location: [place];  Service: Orthopedics;  Laterality: Right;  . TRIGGER FINGER RELEASE Left 11/10/2013   Procedure: LEFT INDEX A-1 PULLEY RELEASE;  Surgeon: Cammie Sickle, MD;  Location: [place];  Service: Orthopedics;  Laterality: Left;  . ULNAR NERVE TRANSPOSITION Left 11/10/2013   Procedure: ULNAR NERVE TRANSPOSITION;  Surgeon: Cammie Sickle, MD;  Location: [place];  Service: Orthopedics;  Laterality: Left;   Social History   Occupational History  . Occupation: Self employed  Tobacco Use  . Smoking status: Former Smoker    Packs/day: 1.00    Years: 37.00    Pack years: 37.00    Types: Cigarettes    Quit date: 05/04/2016    Years since quitting: 3.6  . Smokeless tobacco: Never Used  Vaping Use  . Vaping Use: Former  Substance and Sexual Activity  . Alcohol use: Yes    Alcohol/week: 0.0 standard drinks    Comment: occ  . Drug use: No  . Sexual activity: Not on file

## 2020-01-10 ENCOUNTER — Encounter: Payer: Self-pay | Admitting: Orthopaedic Surgery

## 2020-01-10 ENCOUNTER — Other Ambulatory Visit: Payer: Self-pay

## 2020-01-10 ENCOUNTER — Ambulatory Visit: Payer: BC Managed Care – PPO | Admitting: Orthopaedic Surgery

## 2020-01-10 VITALS — Ht 68.0 in | Wt 160.0 lb

## 2020-01-10 DIAGNOSIS — M1712 Unilateral primary osteoarthritis, left knee: Secondary | ICD-10-CM | POA: Diagnosis not present

## 2020-01-10 MED ORDER — LIDOCAINE HCL 1 % IJ SOLN
2.0000 mL | INTRAMUSCULAR | Status: AC | PRN
  Administered 2020-01-10: 2 mL

## 2020-01-10 NOTE — Progress Notes (Signed)
Office Visit Note   Patient: Philip Winters           Date of Birth: 03-07-1961           MRN: [id number] Visit Date: 01/10/2020              Requested by: Antony Contras, MD [place],  [place] [id number] PCP: Antony Contras, MD   Assessment & Plan: Visit Diagnoses:  1. Primary osteoarthritis of left knee     Plan:  #1: The third GELSYN injection was given without difficulty.  Tolerated the procedure well. #2: Follow back up as needed   Follow-Up Instructions: No follow-ups on file.   Orders:  Orders Placed This Encounter  Procedures  . Large Joint Inj: L knee   No orders of the defined types were placed in this encounter.     Procedures: Large Joint Inj: L knee on 01/10/2020 1:38 PM Indications: pain and joint swelling Details: 25 G 1.5 in needle, anteromedial approach  Arthrogram: No  Medications: 2 mL lidocaine 1 % Outcome: tolerated well, no immediate complications  GELSYN Procedure, treatment alternatives, risks and benefits explained, specific risks discussed. Consent was given by the patient. Immediately prior to procedure a time out was called to verify the correct patient, procedure, equipment, support staff and site/side marked as required. Patient was prepped and draped in the usual sterile fashion.       Clinical Data: No additional findings.   Subjective: Chief Complaint  Patient presents with  . Left Knee - Follow-up    Gelsyn injections started 12/29/2019   HPI Patient presents today for the third Gelsyn injection in his left knee. He started the series on 12/29/2019. He is doing well. He has not had any reaction to the Gelsyn injections, like he did to the Synvisc. No improvement thus far, but wishes to continue with the third today.    Review of Systems  Constitutional: Negative for fatigue.  HENT: Negative for ear pain.   Eyes: Negative for pain.  Respiratory: Negative for shortness of breath.   Cardiovascular:  Negative for leg swelling.  Gastrointestinal: Negative for constipation and diarrhea.  Endocrine: Negative for cold intolerance and heat intolerance.  Genitourinary: Negative for difficulty urinating.  Musculoskeletal: Positive for joint swelling.  Skin: Negative for rash.  Allergic/Immunologic: Negative for food allergies.  Neurological: Negative for weakness.  Hematological: Does not bruise/bleed easily.  Psychiatric/Behavioral: Negative for sleep disturbance.     Objective: Vital Signs: Ht 5\' 8"  (1.727 m)   Wt 160 lb (72.6 kg)   BMI 24.33 kg/m   Physical Exam  Ortho Exam  No adverse effects was noted from the previous injections.  Still has a mild effusion.  No warmth or erythema.  Specialty Comments:  No specialty comments available.  Imaging: No results found.   PMFS History: Current Outpatient Medications  Medication Sig Dispense Refill  . acetaminophen (TYLENOL) 500 MG tablet Take 500 mg by mouth as needed.    Marland Kitchen aspirin EC 81 MG tablet Take 81 mg by mouth daily.    Marland Kitchen diltiazem (CARDIZEM CD) 120 MG 24 hr capsule Take 1 capsule (120 mg total) by mouth daily. 90 capsule 3  . diphenhydrAMINE (BENADRYL) 25 MG tablet Take 25 mg by mouth at bedtime as needed for allergies.    . fluticasone (FLONASE ALLERGY RELIEF) 50 MCG/ACT nasal spray as needed.    . Multiple Vitamin (MULTIVITAMIN WITH MINERALS) TABS tablet Take 1 tablet by  mouth daily.    Marland Kitchen olmesartan (BENICAR) 40 MG tablet Take 40 mg by mouth daily.    Marland Kitchen omeprazole (PRILOSEC) 20 MG capsule Take 20 mg by mouth as needed.    Marland Kitchen tetrahydrozoline 0.05 % ophthalmic solution Place 1 drop into both eyes 3 (three) times daily as needed (for dry/irritated eyes.).    Marland Kitchen traMADol (ULTRAM) 50 MG tablet TK 1 T PO  Q 6 HOURS PRN     No current facility-administered medications for this visit.    Patient Active Problem List   Diagnosis Date Noted  . Primary osteoarthritis of left knee 11/08/2019  . Bilateral primary  osteoarthritis of knee 09/28/2019  . HNP (herniated nucleus pulposus), lumbar 09/29/2017  . Right carotid bruit 06/10/2017  . Paresthesia 06/10/2017  . Left wrist pain 10/09/2016  . Unilateral primary osteoarthritis, right knee 06/24/2016  . S/P total knee replacement using cement, right 06/24/2016  . Acute pain of left knee 03/27/2016  . HYPERLIPIDEMIA 01/18/2007  . HYPERTENSION 01/18/2007   Past Medical History:  Diagnosis Date  . Allergy   . Arthritis   . GERD (gastroesophageal reflux disease)    occ  . Heart murmur    baby  . High cholesterol   . Hypertension   . Pneumonia   . Right carotid bruit 06/10/2017  . Sciatica   . Seasonal allergies   . Sinus tachycardia   . Spinal stenosis     History reviewed. No pertinent family history.  Past Surgical History:  Procedure Laterality Date  . CARPAL TUNNEL WITH CUBITAL TUNNEL Left 11/10/2013   Procedure: LEFT CARPAL TUNNEL RELEASE;  Surgeon: Cammie Sickle, MD;  Location: [place];  Service: Orthopedics;  Laterality: Left;  . COLONOSCOPY    . KNEE ARTHROSCOPY  [id number]   left and right  . LUMBAR LAMINECTOMY/DECOMPRESSION MICRODISCECTOMY Left 09/29/2017   Procedure: LEFT LUMBAR TWO - LUMBAR THREELAMINOTOMY/MICRODISCECTOMY;  Surgeon: Jovita Gamma, MD;  Location: [place];  Service: Neurosurgery;  Laterality: Left;  LEFT LUMBAR 2- LUMBAR 3 LAMINOTOMY/MICRODISCECTOMY  . NASAL SINUS SURGERY  05/2015  . TOTAL KNEE ARTHROPLASTY Right 06/24/2016   Procedure: TOTAL KNEE ARTHROPLASTY;  Surgeon: Garald Balding, MD;  Location: [place];  Service: Orthopedics;  Laterality: Right;  . TRIGGER FINGER RELEASE Left 11/10/2013   Procedure: LEFT INDEX A-1 PULLEY RELEASE;  Surgeon: Cammie Sickle, MD;  Location: [place];  Service: Orthopedics;  Laterality: Left;  . ULNAR NERVE TRANSPOSITION Left 11/10/2013   Procedure: ULNAR NERVE TRANSPOSITION;  Surgeon: Cammie Sickle, MD;  Location: [place];  Service: Orthopedics;  Laterality: Left;   Social History   Occupational History  . Occupation: Self employed  Tobacco Use  . Smoking status: Former Smoker    Packs/day: 1.00    Years: 37.00    Pack years: 37.00    Types: Cigarettes    Quit date: 05/04/2016    Years since quitting: 3.6  . Smokeless tobacco: Never Used  Vaping Use  . Vaping Use: Former  Substance and Sexual Activity  . Alcohol use: Yes    Alcohol/week: 0.0 standard drinks    Comment: occ  . Drug use: No  . Sexual activity: Not on file

## 2020-01-11 ENCOUNTER — Other Ambulatory Visit: Payer: Self-pay | Admitting: Cardiology

## 2020-02-06 DIAGNOSIS — I10 Essential (primary) hypertension: Secondary | ICD-10-CM | POA: Diagnosis not present

## 2020-02-06 DIAGNOSIS — E782 Mixed hyperlipidemia: Secondary | ICD-10-CM | POA: Diagnosis not present

## 2020-02-06 DIAGNOSIS — K760 Fatty (change of) liver, not elsewhere classified: Secondary | ICD-10-CM | POA: Diagnosis not present

## 2020-02-06 DIAGNOSIS — Z7189 Other specified counseling: Secondary | ICD-10-CM | POA: Diagnosis not present

## 2020-02-06 DIAGNOSIS — R7309 Other abnormal glucose: Secondary | ICD-10-CM | POA: Diagnosis not present

## 2020-02-06 DIAGNOSIS — R000 Tachycardia, unspecified: Secondary | ICD-10-CM | POA: Diagnosis not present

## 2020-02-08 DIAGNOSIS — M48061 Spinal stenosis, lumbar region without neurogenic claudication: Secondary | ICD-10-CM | POA: Diagnosis not present

## 2020-02-08 DIAGNOSIS — M5416 Radiculopathy, lumbar region: Secondary | ICD-10-CM | POA: Diagnosis not present

## 2020-02-08 DIAGNOSIS — M5136 Other intervertebral disc degeneration, lumbar region: Secondary | ICD-10-CM | POA: Diagnosis not present

## 2020-02-08 DIAGNOSIS — M48062 Spinal stenosis, lumbar region with neurogenic claudication: Secondary | ICD-10-CM | POA: Diagnosis not present

## 2020-02-21 ENCOUNTER — Other Ambulatory Visit: Payer: Self-pay | Admitting: Cardiology

## 2020-02-28 DIAGNOSIS — M48062 Spinal stenosis, lumbar region with neurogenic claudication: Secondary | ICD-10-CM | POA: Diagnosis not present

## 2020-03-08 ENCOUNTER — Ambulatory Visit: Payer: BC Managed Care – PPO | Admitting: Cardiology

## 2020-03-08 ENCOUNTER — Encounter: Payer: Self-pay | Admitting: *Deleted

## 2020-03-08 ENCOUNTER — Encounter: Payer: Self-pay | Admitting: Cardiology

## 2020-03-08 ENCOUNTER — Other Ambulatory Visit: Payer: Self-pay

## 2020-03-08 VITALS — BP 120/80 | HR 116 | Ht 68.0 in | Wt 159.0 lb

## 2020-03-08 DIAGNOSIS — R072 Precordial pain: Secondary | ICD-10-CM

## 2020-03-08 DIAGNOSIS — R0602 Shortness of breath: Secondary | ICD-10-CM | POA: Diagnosis not present

## 2020-03-08 DIAGNOSIS — R079 Chest pain, unspecified: Secondary | ICD-10-CM | POA: Diagnosis not present

## 2020-03-08 MED ORDER — DILTIAZEM HCL ER COATED BEADS 240 MG PO CP24: 240.0000 mg | ORAL_CAPSULE | Freq: Every day | ORAL | 3 refills | Status: DC

## 2020-03-08 NOTE — Progress Notes (Signed)
Cardiology Office Note:    Date:  03/08/2020   ID:  Philip Winters, DOB 03/03/1961, MRN [id number]  PCP:  Antony Contras, MD  [facility] Cardiologist:  Candee Furbish, MD  [facility] Electrophysiologist:  None   Referring MD: Antony Contras, MD    History of Present Illness:    Philip Winters is a 59 y.o. male here for the follow-up of hypertension.  I previously seen him originally on 2019 for the evaluation of hypertension.  At that time he did not tolerate beta-blocker classes he was discontinued on metoprolol.  He had some tachycardia.  He was on generic Benicar.  EKG showed sinus tachycardia with rate 104.  Once again heart rate has been fast.  All of his life he states.  When on Bb HR went to 80, felt like could not function.   Father had PVD, carotid dz.  Tob - Quit 04/2016 - wellbutrin.  40 year hx  Back pain - Dr. Sherwood Gambler. Saw Dr. Vertell Limber. Has not been walking as much. If going up incline feel SOB, chest burning.  Relieved with rest this worries him.  Couple of his friends have had stent placement.  Works as a Development worker, community. Past Medical History:  Diagnosis Date  . Allergy   . Arthritis   . GERD (gastroesophageal reflux disease)    occ  . Heart murmur    baby  . High cholesterol   . Hypertension   . Pneumonia   . Right carotid bruit 06/10/2017  . Sciatica   . Seasonal allergies   . Sinus tachycardia   . Spinal stenosis     Past Surgical History:  Procedure Laterality Date  . CARPAL TUNNEL WITH CUBITAL TUNNEL Left 11/10/2013   Procedure: LEFT CARPAL TUNNEL RELEASE;  Surgeon: Cammie Sickle, MD;  Location: [place];  Service: Orthopedics;  Laterality: Left;  . COLONOSCOPY    . KNEE ARTHROSCOPY  [id number]   left and right  . LUMBAR LAMINECTOMY/DECOMPRESSION MICRODISCECTOMY Left 09/29/2017   Procedure: LEFT LUMBAR TWO - LUMBAR THREELAMINOTOMY/MICRODISCECTOMY;  Surgeon: Jovita Gamma, MD;  Location: [place];  Service: Neurosurgery;  Laterality:  Left;  LEFT LUMBAR 2- LUMBAR 3 LAMINOTOMY/MICRODISCECTOMY  . NASAL SINUS SURGERY  05/2015  . TOTAL KNEE ARTHROPLASTY Right 06/24/2016   Procedure: TOTAL KNEE ARTHROPLASTY;  Surgeon: Garald Balding, MD;  Location: [place];  Service: Orthopedics;  Laterality: Right;  . TRIGGER FINGER RELEASE Left 11/10/2013   Procedure: LEFT INDEX A-1 PULLEY RELEASE;  Surgeon: Cammie Sickle, MD;  Location: [place];  Service: Orthopedics;  Laterality: Left;  . ULNAR NERVE TRANSPOSITION Left 11/10/2013   Procedure: ULNAR NERVE TRANSPOSITION;  Surgeon: Cammie Sickle, MD;  Location: [place];  Service: Orthopedics;  Laterality: Left;    Current Medications: Current Meds  Medication Sig  . acetaminophen (TYLENOL) 500 MG tablet Take 500 mg by mouth as needed.  Marland Kitchen aspirin EC 81 MG tablet Take 81 mg by mouth daily.  . diphenhydrAMINE (BENADRYL) 25 MG tablet Take 25 mg by mouth at bedtime as needed for allergies.  . fluticasone (FLONASE ALLERGY RELIEF) 50 MCG/ACT nasal spray as needed.  . Multiple Vitamin (MULTIVITAMIN WITH MINERALS) TABS tablet Take 1 tablet by mouth daily.  Marland Kitchen olmesartan (BENICAR) 40 MG tablet Take 40 mg by mouth daily.  Marland Kitchen omeprazole (PRILOSEC) 20 MG capsule Take 20 mg by mouth as needed.  Marland Kitchen tetrahydrozoline 0.05 % ophthalmic solution Place 1 drop into  both eyes 3 (three) times daily as needed (for dry/irritated eyes.).  Marland Kitchen traMADol (ULTRAM) 50 MG tablet TK 1 T PO  Q 6 HOURS PRN  . [DISCONTINUED] diltiazem (CARDIZEM CD) 120 MG 24 hr capsule TAKE 1 CAPSULE(120 MG) BY MOUTH DAILY     Allergies:   Mushroom extract complex, Penicillins, Dilaudid [hydromorphone], Bystolic [nebivolol hcl], Codeine, Iodine, and Metoprolol   Social History   Socioeconomic History  . Marital status: Married    Spouse name: [name]  . Number of children: 0  . Years of education: 63  . Highest education level: Not on file  Occupational History  . Occupation: Self employed   Tobacco Use  . Smoking status: Former Smoker    Packs/day: 1.00    Years: 37.00    Pack years: 37.00    Types: Cigarettes    Quit date: 05/04/2016    Years since quitting: 3.8  . Smokeless tobacco: Never Used  Vaping Use  . Vaping Use: Former  Substance and Sexual Activity  . Alcohol use: Yes    Alcohol/week: 0.0 standard drinks    Comment: occ  . Drug use: No  . Sexual activity: Not on file  Other Topics Concern  . Not on file  Social History Narrative   Lives with wife, [name]   Caffeine use: Coffee daily   Right handed    Social Determinants of Health   Financial Resource Strain:   . Difficulty of Paying Living Expenses: Not on file  Food Insecurity:   . Worried About Charity fundraiser in the Last Year: Not on file  . Ran Out of Food in the Last Year: Not on file  Transportation Needs:   . Lack of Transportation (Medical): Not on file  . Lack of Transportation (Non-Medical): Not on file  Physical Activity:   . Days of Exercise per Week: Not on file  . Minutes of Exercise per Session: Not on file  Stress:   . Feeling of Stress : Not on file  Social Connections:   . Frequency of Communication with Friends and Family: Not on file  . Frequency of Social Gatherings with Friends and Family: Not on file  . Attends Religious Services: Not on file  . Active Member of Clubs or Organizations: Not on file  . Attends Archivist Meetings: Not on file  . Marital Status: Not on file      ROS:   Please see the history of present illness.     All other systems reviewed and are negative.  EKGs/Labs/Other Studies Reviewed:     Recent Labs: No results found for requested labs within last 8760 hours.  Recent Lipid Panel No results found for: CHOL, TRIG, HDL, CHOLHDL, VLDL, LDLCALC, LDLDIRECT   Risk Assessment/Calculations:       Physical Exam:    VS:  BP 120/80   Pulse (!) 116   Ht 5\' 8"  (1.727 m)   Wt 159 lb (72.1 kg)   SpO2 98%   BMI 24.18 kg/m      Wt Readings from Last 3 Encounters:  03/08/20 159 lb (72.1 kg)  01/10/20 160 lb (72.6 kg)  01/03/20 160 lb (72.6 kg)     GEN:  Well nourished, well developed in no acute distress HEENT: Normal NECK: No JVD; No carotid bruits LYMPHATICS: No lymphadenopathy CARDIAC: RRR, no murmurs, rubs, gallops RESPIRATORY:  Clear to auscultation without rales, wheezing or rhonchi  ABDOMEN: Soft, non-tender, non-distended MUSCULOSKELETAL:  No edema; No deformity  SKIN: Warm and dry NEUROLOGIC:  Alert and oriented x 3 PSYCHIATRIC:  Normal affect   ASSESSMENT:    1. Chest pain of uncertain etiology   2. Precordial pain   3. Shortness of breath    PLAN:    In order of problems listed above:  Sinus tachycardia --This has been steady for most years of his life.  He remembers getting accepted into the [employer] with his tachycardia.  We discussed at prior visits the possibility of tachycardia mediated cardiomyopathy.  At that time, I gave him Cardizem 120 once a day.  I would like to go ahead and increase this to 240 mg once a day.  Blood pressure 120/80 here, 140 usually at home. -Echocardiogram in 2019 showed normal pump function.  His aortic root was 39 mm mildly dilated. -Hemoglobin 14.7, creatinine 0.7, prior LDL 135  Dyspnea on exertion -I will repeat echocardiogram given his shortness of breath  Chest burning -I am going to check a Lexiscan stress test.  I think his heart rate is too fast to have a CT scan.     Medication Adjustments/Labs and Tests Ordered: Current medicines are reviewed at length with the patient today.  Concerns regarding medicines are outlined above.  Orders Placed This Encounter  Procedures  . MYOCARDIAL PERFUSION IMAGING  . ECHOCARDIOGRAM COMPLETE   Meds ordered this encounter  Medications  . diltiazem (CARDIZEM CD) 240 MG 24 hr capsule    Sig: Take 1 capsule (240 mg total) by mouth daily.    Dispense:  90 capsule    Refill:  3    Patient  Instructions  Medication Instructions:  Please increase your Diltiazem to 240 mg daily. Continue all other medications as listed.  *If you need a refill on your cardiac medications before your next appointment, please call your pharmacy*  Testing/Procedures: Your physician has requested that you have an echocardiogram. Echocardiography is a painless test that uses sound waves to create images of your heart. It provides your doctor with information about the size and shape of your heart and how well your heart's chambers and valves are working. This procedure takes approximately one hour. There are no restrictions for this procedure.  Your physician has requested that you have a lexiscan myoview. For further information please visit [email or web address]. Please follow instruction sheet, as given.  Follow-Up: At [facility], you and your health needs are our priority.  As part of our continuing mission to provide you with exceptional heart care, we have created designated Provider Care Teams.  These Care Teams include your primary Cardiologist (physician) and Advanced Practice Providers (APPs -  Physician Assistants and Nurse Practitioners) who all work together to provide you with the care you need, when you need it.  We recommend signing up for the patient portal called "MyChart".  Sign up information is provided on this After Visit Summary.  MyChart is used to connect with patients for Virtual Visits (Telemedicine).  Patients are able to view lab/test results, encounter notes, upcoming appointments, etc.  Non-urgent messages can be sent to your provider as well.   To learn more about what you can do with MyChart, go to [email or web address].    Your next appointment:   3 month(s)  The format for your next appointment:   In Person  Provider:   Candee Furbish, MD   Thank you for choosing [facility]!!        Signed, Candee Furbish, MD  03/08/2020 5:05 PM    [facility]

## 2020-03-08 NOTE — Patient Instructions (Signed)
Medication Instructions:  Please increase your Diltiazem to 240 mg daily. Continue all other medications as listed.  *If you need a refill on your cardiac medications before your next appointment, please call your pharmacy*  Testing/Procedures: Your physician has requested that you have an echocardiogram. Echocardiography is a painless test that uses sound waves to create images of your heart. It provides your doctor with information about the size and shape of your heart and how well your heart's chambers and valves are working. This procedure takes approximately one hour. There are no restrictions for this procedure.  Your physician has requested that you have a lexiscan myoview. For further information please visit [email or web address]. Please follow instruction sheet, as given.  Follow-Up: At [facility], you and your health needs are our priority.  As part of our continuing mission to provide you with exceptional heart care, we have created designated Provider Care Teams.  These Care Teams include your primary Cardiologist (physician) and Advanced Practice Providers (APPs -  Physician Assistants and Nurse Practitioners) who all work together to provide you with the care you need, when you need it.  We recommend signing up for the patient portal called "MyChart".  Sign up information is provided on this After Visit Summary.  MyChart is used to connect with patients for Virtual Visits (Telemedicine).  Patients are able to view lab/test results, encounter notes, upcoming appointments, etc.  Non-urgent messages can be sent to your provider as well.   To learn more about what you can do with MyChart, go to [email or web address].    Your next appointment:   3 month(s)  The format for your next appointment:   In Person  Provider:   Candee Furbish, MD   Thank you for choosing [facility]!!

## 2020-03-13 ENCOUNTER — Telehealth: Payer: Self-pay | Admitting: Acute Care

## 2020-03-16 DIAGNOSIS — Z23 Encounter for immunization: Secondary | ICD-10-CM | POA: Diagnosis not present

## 2020-03-16 NOTE — Telephone Encounter (Signed)
LMTC x 1  

## 2020-03-21 DIAGNOSIS — M5136 Other intervertebral disc degeneration, lumbar region: Secondary | ICD-10-CM | POA: Diagnosis not present

## 2020-03-21 DIAGNOSIS — M5416 Radiculopathy, lumbar region: Secondary | ICD-10-CM | POA: Diagnosis not present

## 2020-03-21 DIAGNOSIS — M48062 Spinal stenosis, lumbar region with neurogenic claudication: Secondary | ICD-10-CM | POA: Diagnosis not present

## 2020-03-26 ENCOUNTER — Other Ambulatory Visit: Payer: Self-pay | Admitting: *Deleted

## 2020-03-26 DIAGNOSIS — Z87891 Personal history of nicotine dependence: Secondary | ICD-10-CM

## 2020-03-26 NOTE — Telephone Encounter (Signed)
LMTC x 1 - Will close this message and refer to referral notes 

## 2020-03-29 ENCOUNTER — Telehealth (HOSPITAL_COMMUNITY): Payer: Self-pay

## 2020-03-29 ENCOUNTER — Encounter (HOSPITAL_COMMUNITY): Payer: Self-pay

## 2020-03-29 NOTE — Telephone Encounter (Signed)
Attempted to contact the patient with instructions for his instructions. Answering stated he was not available. S.Girtrude Enslin EMTP. Will send a my chart letter.

## 2020-04-02 ENCOUNTER — Telehealth: Payer: Self-pay | Admitting: Acute Care

## 2020-04-03 ENCOUNTER — Other Ambulatory Visit: Payer: Self-pay

## 2020-04-03 ENCOUNTER — Encounter: Payer: Self-pay | Admitting: Orthopaedic Surgery

## 2020-04-03 ENCOUNTER — Ambulatory Visit: Payer: BC Managed Care – PPO | Admitting: Orthopaedic Surgery

## 2020-04-03 VITALS — Ht 68.0 in | Wt 159.0 lb

## 2020-04-03 DIAGNOSIS — M1712 Unilateral primary osteoarthritis, left knee: Secondary | ICD-10-CM | POA: Diagnosis not present

## 2020-04-03 MED ORDER — METHYLPREDNISOLONE ACETATE 40 MG/ML IJ SUSP
80.0000 mg | INTRAMUSCULAR | Status: AC | PRN
  Administered 2020-04-03: 80 mg via INTRA_ARTICULAR

## 2020-04-03 MED ORDER — LIDOCAINE HCL 1 % IJ SOLN
2.0000 mL | INTRAMUSCULAR | Status: AC | PRN
  Administered 2020-04-03: 2 mL

## 2020-04-03 MED ORDER — BUPIVACAINE HCL 0.25 % IJ SOLN
2.0000 mL | INTRAMUSCULAR | Status: AC | PRN
  Administered 2020-04-03: 2 mL via INTRA_ARTICULAR

## 2020-04-03 NOTE — Progress Notes (Signed)
Office Visit Note   Patient: Philip Winters           Date of Birth: 08/07/1960           MRN: [id number] Visit Date: 04/03/2020              Requested by: Antony Contras, MD [place],  [place] [address] PCP: Antony Contras, MD   Assessment & Plan: Visit Diagnoses:  1. Primary osteoarthritis of left knee     Plan:  #1: Aspiration of the knee yielded 16 cc of yellow clear fluid. #2: Knee was injected with Depo-Medrol. #3: Chlorhexidine was used as a prep since he is allergic to Betadine   Follow-Up Instructions: Return if symptoms worsen or fail to improve.   Orders:  No orders of the defined types were placed in this encounter.  No orders of the defined types were placed in this encounter.     Procedures: Large Joint Inj: L knee on 04/03/2020 3:31 PM Indications: pain and diagnostic evaluation Details: 18 G 1.5 in needle, anteromedial approach  Arthrogram: No  Medications: 2 mL lidocaine 1 %; 80 mg methylPREDNISolone acetate 40 MG/ML; 2 mL bupivacaine 0.25 % Aspirate: clear, yellow and blood-tinged Outcome: tolerated well, no immediate complications Procedure, treatment alternatives, risks and benefits explained, specific risks discussed. Consent was given by the patient. Immediately prior to procedure a time out was called to verify the correct patient, procedure, equipment, support staff and site/side marked as required. Patient was prepped and draped in the usual sterile fashion.       Clinical Data: No additional findings.   Subjective: Chief Complaint  Patient presents with  . Left Knee - Pain   HPI Patient presents today for recurrent left knee pain. He finished the [place] series on 01/10/2020 and has been doing great until last week. 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 He states that afterwards his left knee flared up and got swollen. His pain is located medially. He has been taking  Aleve for pain.   Review of Systems  Constitutional: Negative for fatigue.  HENT: Negative for ear pain.   Eyes: Negative for pain.  Respiratory: Negative for shortness of breath.   Cardiovascular: Negative for leg swelling.  Gastrointestinal: Negative for constipation and diarrhea.  Endocrine: Negative for cold intolerance and heat intolerance.  Genitourinary: Negative for difficulty urinating.  Musculoskeletal: Positive for joint swelling.  Skin: Negative for rash.  Allergic/Immunologic: Negative for food allergies.  Neurological: Negative for weakness.  Hematological: Does not bruise/bleed easily.  Psychiatric/Behavioral: Negative for sleep disturbance.     Objective: Vital Signs: Ht 5\' 8"  (1.727 m)   Wt 159 lb (72.1 kg)   BMI 24.18 kg/m   Physical Exam Constitutional:      Appearance: Normal appearance. He is normal weight.  HENT:     Head: Normocephalic.     Nose: Nose normal.  Eyes:     Extraocular Movements: Extraocular movements intact.     Conjunctiva/sclera: Conjunctivae normal.     Pupils: Pupils are equal, round, and reactive to light.  Pulmonary:     Effort: Pulmonary effort is normal.  Skin:    General: Skin is warm.  Neurological:     Mental Status: He is alert.  Psychiatric:        Mood and Affect: Mood normal.     Ortho Exam  Exam today reveals some  mild swelling within the knee.  No warmth or erythema.  No ecchymosis.  No prepatellar swelling.  Ranges from about 8 degrees to that about 110 degrees.  Specialty Comments:  No specialty comments available.  Imaging: No results found.   PMFS History: Current Outpatient Medications  Medication Sig Dispense Refill  . acetaminophen (TYLENOL) 500 MG tablet Take 500 mg by mouth as needed.    Marland Kitchen aspirin EC 81 MG tablet Take 81 mg by mouth daily.    Marland Kitchen diltiazem (CARDIZEM CD) 240 MG 24 hr capsule Take 1 capsule (240 mg total) by mouth daily. 90 capsule 3  . diphenhydrAMINE (BENADRYL) 25 MG tablet  Take 25 mg by mouth at bedtime as needed for allergies.    . fluticasone (FLONASE ALLERGY RELIEF) 50 MCG/ACT nasal spray as needed.    . Multiple Vitamin (MULTIVITAMIN WITH MINERALS) TABS tablet Take 1 tablet by mouth daily.    Marland Kitchen olmesartan (BENICAR) 40 MG tablet Take 40 mg by mouth daily.    Marland Kitchen omeprazole (PRILOSEC) 20 MG capsule Take 20 mg by mouth as needed.    Marland Kitchen tetrahydrozoline 0.05 % ophthalmic solution Place 1 drop into both eyes 3 (three) times daily as needed (for dry/irritated eyes.).    Marland Kitchen traMADol (ULTRAM) 50 MG tablet TK 1 T PO  Q 6 HOURS PRN     No current facility-administered medications for this visit.    Patient Active Problem List   Diagnosis Date Noted  . Primary osteoarthritis of left knee 11/08/2019  . Bilateral primary osteoarthritis of knee 09/28/2019  . HNP (herniated nucleus pulposus), lumbar 09/29/2017  . Right carotid bruit 06/10/2017  . Paresthesia 06/10/2017  . Left wrist pain 10/09/2016  . Unilateral primary osteoarthritis, right knee 06/24/2016  . S/P total knee replacement using cement, right 06/24/2016  . Acute pain of left knee 03/27/2016  . HYPERLIPIDEMIA 01/18/2007  . HYPERTENSION 01/18/2007   Past Medical History:  Diagnosis Date  . Allergy   . Arthritis   . GERD (gastroesophageal reflux disease)    occ  . Heart murmur    baby  . High cholesterol   . Hypertension   . Pneumonia   . Right carotid bruit 06/10/2017  . Sciatica   . Seasonal allergies   . Sinus tachycardia   . Spinal stenosis     History reviewed. No pertinent family history.  Past Surgical History:  Procedure Laterality Date  . CARPAL TUNNEL WITH CUBITAL TUNNEL Left 11/10/2013   Procedure: LEFT CARPAL TUNNEL RELEASE;  Surgeon: Cammie Sickle, MD;  Location: [facility];  Service: Orthopedics;  Laterality: Left;  . COLONOSCOPY    . KNEE ARTHROSCOPY  [id number]   left and right  . LUMBAR LAMINECTOMY/DECOMPRESSION MICRODISCECTOMY Left 09/29/2017   Procedure:  LEFT LUMBAR TWO - LUMBAR THREELAMINOTOMY/MICRODISCECTOMY;  Surgeon: Jovita Gamma, MD;  Location: [facility];  Service: Neurosurgery;  Laterality: Left;  LEFT LUMBAR 2- LUMBAR 3 LAMINOTOMY/MICRODISCECTOMY  . NASAL SINUS SURGERY  05/2015  . TOTAL KNEE ARTHROPLASTY Right 06/24/2016   Procedure: TOTAL KNEE ARTHROPLASTY;  Surgeon: Garald Balding, MD;  Location: [facility];  Service: Orthopedics;  Laterality: Right;  . TRIGGER FINGER RELEASE Left 11/10/2013   Procedure: LEFT INDEX A-1 PULLEY RELEASE;  Surgeon: Cammie Sickle, MD;  Location: [facility];  Service: Orthopedics;  Laterality: Left;  . ULNAR NERVE TRANSPOSITION Left 11/10/2013   Procedure: ULNAR NERVE TRANSPOSITION;  Surgeon: Cammie Sickle, MD;  Location: [facility];  Service: Orthopedics;  Laterality: Left;   Social History   Occupational History  . Occupation: Self employed  Tobacco Use  . Smoking status: Former Smoker    Packs/day: 1.00    Years: 37.00    Pack years: 37.00    Types: Cigarettes    Quit date: 05/04/2016    Years since quitting: 3.9  . Smokeless tobacco: Never Used  Vaping Use  . Vaping Use: Former  Substance and Sexual Activity  . Alcohol use: Yes    Alcohol/week: 0.0 standard drinks    Comment: occ  . Drug use: No  . Sexual activity: Not on file

## 2020-04-04 NOTE — Telephone Encounter (Signed)
SDMV and LDCT appt cancelled. Will await call back from pt to reschedule. Will close this message and refer to referral notes.

## 2020-04-05 ENCOUNTER — Other Ambulatory Visit: Payer: Self-pay

## 2020-04-05 ENCOUNTER — Ambulatory Visit (HOSPITAL_COMMUNITY): Payer: BC Managed Care – PPO | Attending: Cardiology

## 2020-04-05 ENCOUNTER — Ambulatory Visit (HOSPITAL_BASED_OUTPATIENT_CLINIC_OR_DEPARTMENT_OTHER): Payer: BC Managed Care – PPO

## 2020-04-05 DIAGNOSIS — R072 Precordial pain: Secondary | ICD-10-CM | POA: Diagnosis not present

## 2020-04-05 DIAGNOSIS — R079 Chest pain, unspecified: Secondary | ICD-10-CM | POA: Diagnosis not present

## 2020-04-05 DIAGNOSIS — R0602 Shortness of breath: Secondary | ICD-10-CM

## 2020-04-05 LAB — ECHOCARDIOGRAM COMPLETE
AR max vel: 1.58 cm2
AV Area VTI: 1.78 cm2
AV Area mean vel: 1.69 cm2
AV Mean grad: 14 mmHg
AV Peak grad: 29.3 mmHg
Ao pk vel: 2.71 m/s
Area-P 1/2: 4.44 cm2
Height: 63 in
P 1/2 time: 325 msec
S' Lateral: 2.8 cm
Weight: 2544 oz

## 2020-04-05 LAB — MYOCARDIAL PERFUSION IMAGING
LV dias vol: 99 mL (ref 62–150)
LV sys vol: 50 mL
Peak HR: 131 {beats}/min
Rest HR: 99 {beats}/min
SDS: 8
SRS: 2
SSS: 10
TID: 1.07

## 2020-04-05 MED ORDER — REGADENOSON 0.4 MG/5ML IV SOLN
0.4000 mg | Freq: Once | INTRAVENOUS | Status: AC
  Administered 2020-04-05: 0.4 mg via INTRAVENOUS

## 2020-04-05 MED ORDER — TECHNETIUM TC 99M TETROFOSMIN IV KIT
10.3000 | PACK | Freq: Once | INTRAVENOUS | Status: AC | PRN
  Administered 2020-04-05: 10.3 via INTRAVENOUS
  Filled 2020-04-05: qty 11

## 2020-04-05 MED ORDER — TECHNETIUM TC 99M TETROFOSMIN IV KIT
30.5000 | PACK | Freq: Once | INTRAVENOUS | Status: AC | PRN
  Administered 2020-04-05: 30.5 via INTRAVENOUS
  Filled 2020-04-05: qty 31

## 2020-04-06 ENCOUNTER — Ambulatory Visit: Payer: BC Managed Care – PPO | Admitting: Cardiology

## 2020-04-06 ENCOUNTER — Encounter: Payer: Self-pay | Admitting: Cardiology

## 2020-04-06 VITALS — BP 150/90 | HR 93 | Ht 63.0 in | Wt 158.0 lb

## 2020-04-06 DIAGNOSIS — I350 Nonrheumatic aortic (valve) stenosis: Secondary | ICD-10-CM | POA: Diagnosis not present

## 2020-04-06 DIAGNOSIS — Q231 Congenital insufficiency of aortic valve: Secondary | ICD-10-CM

## 2020-04-06 DIAGNOSIS — I10 Essential (primary) hypertension: Secondary | ICD-10-CM | POA: Diagnosis not present

## 2020-04-06 DIAGNOSIS — I208 Other forms of angina pectoris: Secondary | ICD-10-CM

## 2020-04-06 DIAGNOSIS — I6523 Occlusion and stenosis of bilateral carotid arteries: Secondary | ICD-10-CM | POA: Diagnosis not present

## 2020-04-06 DIAGNOSIS — R9439 Abnormal result of other cardiovascular function study: Secondary | ICD-10-CM

## 2020-04-06 DIAGNOSIS — Z01812 Encounter for preprocedural laboratory examination: Secondary | ICD-10-CM

## 2020-04-06 DIAGNOSIS — I77810 Thoracic aortic ectasia: Secondary | ICD-10-CM

## 2020-04-06 DIAGNOSIS — I700 Atherosclerosis of aorta: Secondary | ICD-10-CM | POA: Diagnosis not present

## 2020-04-06 DIAGNOSIS — Z789 Other specified health status: Secondary | ICD-10-CM

## 2020-04-06 LAB — BASIC METABOLIC PANEL
BUN/Creatinine Ratio: 16 (ref 10–24)
BUN: 12 mg/dL (ref 8–27)
CO2: 26 mmol/L (ref 20–29)
Calcium: 9.9 mg/dL (ref 8.6–10.2)
Chloride: 99 mmol/L (ref 96–106)
Creatinine, Ser: 0.75 mg/dL — ABNORMAL LOW (ref 0.76–1.27)
GFR calc Af Amer: 115 mL/min/{1.73_m2} (ref >59)
GFR calc non Af Amer: 100 mL/min/{1.73_m2} (ref >59)
Glucose: 94 mg/dL (ref 65–99)
Potassium: 4.3 mmol/L (ref 3.5–5.2)
Sodium: 138 mmol/L (ref 134–144)

## 2020-04-06 LAB — CBC
Hematocrit: 43.8 % (ref 37.5–51.0)
Hemoglobin: 14.9 g/dL (ref 13.0–17.7)
MCH: 33.3 pg — ABNORMAL HIGH (ref 26.6–33.0)
MCHC: 34 g/dL (ref 31.5–35.7)
MCV: 98 fL — ABNORMAL HIGH (ref 79–97)
Platelets: 226 10*3/uL (ref 150–450)
RBC: 4.48 x10E6/uL (ref 4.14–5.80)
RDW: 11.4 % — ABNORMAL LOW (ref 11.6–15.4)
WBC: 6.1 10*3/uL (ref 3.4–10.8)

## 2020-04-06 NOTE — Patient Instructions (Signed)
Medication Instructions:  The current medical regimen is effective;  continue present plan and medications.  *If you need a refill on your cardiac medications before your next appointment, please call your pharmacy*   Lab Work: Please have blood work today (CBC, BMP)  If you have labs (blood work) drawn today and your tests are completely normal, you will receive your results only by: Marland Kitchen MyChart Message (if you have MyChart) OR . A paper copy in the mail If you have any lab test that is abnormal or we need to change your treatment, we will call you to review the results.  You will need Covid screening as scheduled.  Once you have the screening you will need to self quarantine at home until your procedure.  Testing/Procedures:   [place] OFFICE [place], [place]  [place] [address] Dept: [place]: [phone]  Philip Winters  04/06/2020  You are scheduled for a cardiac cath on Tuesday April 10, 2020 with Dr. Claiborne Billings.  1. Please arrive at the [facility] (Main Entrance A) at [facility]: [address] [place], [place] [address] at 5:30 am  (two hours before your procedure to ensure your preparation). Free valet parking service is available.   Special note: Every effort is made to have your procedure done on time. Please understand that emergencies sometimes delay scheduled procedures.  2. Diet: Nothing after midnight the night before except your medications with sips of water.  3. Labs: have today (CBC, BMP)     Covid testing as scheduled.  4. Medication instructions in preparation for your procedure:  On the morning of your procedure, take your Aspirin and any morning medicines NOT listed above.  You may use sips of water.  5. Plan for one night stay--bring personal belongings. 6. Bring a current list of your medications and current insurance cards. 7. You MUST have a  responsible person to drive you home. 8. Someone MUST be with you the first 24 hours after you arrive home or your discharge will be delayed. 9. Please wear clothes that are easy to get on and off and wear slip-on shoes.  Thank you for allowing [name] to care for you!   -- [facility]  Follow-Up: At [facility], you and your health needs are our priority.  As part of our continuing mission to provide you with exceptional heart care, we have created designated Provider Care Teams.  These Care Teams include your primary Cardiologist (physician) and Advanced Practice Providers (APPs -  Physician Assistants and Nurse Practitioners) who all work together to provide you with the care you need, when you need it.  We recommend signing up for the patient portal called "MyChart".  Sign up information is provided on this After Visit Summary.  MyChart is used to connect with patients for Virtual Visits (Telemedicine).  Patients are able to view lab/test results, encounter notes, upcoming appointments, etc.  Non-urgent messages can be sent to your provider as well.   To learn more about what you can do with MyChart, go to [email or web address].     You have been referred to the [facility].  Your next appointment:   3 week(s)  The format for your next appointment:   In Person  Provider:   Candee Furbish, MD   Thank you for choosing [facility]!!

## 2020-04-06 NOTE — Progress Notes (Signed)
Cardiology Office Note:    Date:  04/06/2020   ID:  Philip Winters, DOB 25-Jun-1960, MRN [id number]  PCP:  Antony Contras, MD  [facility] Cardiologist:  Candee Furbish, MD  [facility] Electrophysiologist:  None   Referring MD: Antony Contras, MD     History of Present Illness:    Philip Winters is a 59 y.o. male here to discuss abnormal nuclear stress test.  I had seen him on 03/08/2020 where he stated he had sensation of shortness of breath and chest burning when going up hills relieved with rest that worried him.  A couple of his friends have had stent placements recently.  He is also been battling with some back pain and seeing Dr. Sherwood Gambler.  His father had peripheral vascular disease, carotid artery disease.  He quit tobacco in 2017 after the use of Wellbutrin.  His EKG demonstrated sinus tachycardia with rate of 104-he states that this is not unusual for him to have a fast heart rate for most of his life.  Previously in 2019 I saw him originally for the evaluation of hypertension.  He did not tolerate beta-blocker class metoprolol.  Had been on olmesartan..  For instance, when he was on metoprolol his heart rate was in the 80s and he felt like he could not function he states.  Because of his shortness of breath with activity that was concerning for angina he underwent a nuclear stress test as below.  This was abnormal.  NUC stress 04/05/2020:  The left ventricular ejection fraction is mildly decreased (45-54%).  Nuclear stress EF: 50%.  Horizontal ST segment depression ST segment depression was noted during stress in the II, aVF and III leads.  Defect 1: There is a large defect of moderate severity present in the basal inferoseptal, mid anterior, mid anteroseptal, mid inferoseptal, apical anterior, apical septal and apex location.  Findings consistent with prior myocardial infarction with peri-infarct ischemia.  This is a high risk study.   Abnormal study, high risk. There  is a large defect as noted. The apex, apical anterior/anteroseptal and mid anterior/anteroseptal portions of the defect are very reversible, consistent with ischemia. The inferior/inferoseptal portions appear to be more of a fixed defect with partial reversibility. There is no clear focal wall motion abnormalities on the images, but overall EF is mildly reduced   Taking GERD medication.  Heartburn may be anginal symptoms.  Also on echocardiogram personally reviewed, appears to have a bicuspid aortic valve that is calcified.  Mild aortic stenosis, peak velocity 2.8 m/s.  He is a Development worker, community.  Classic symptoms when carrying his 80 pound toolbox upstairs.  He has had trouble in the past trying to take statin therapy.  Intolerance.  Past Medical History:  Diagnosis Date  . Allergy   . Arthritis   . GERD (gastroesophageal reflux disease)    occ  . Heart murmur    baby  . High cholesterol   . Hypertension   . Pneumonia   . Right carotid bruit 06/10/2017  . Sciatica   . Seasonal allergies   . Sinus tachycardia   . Spinal stenosis     Past Surgical History:  Procedure Laterality Date  . CARPAL TUNNEL WITH CUBITAL TUNNEL Left 11/10/2013   Procedure: LEFT CARPAL TUNNEL RELEASE;  Surgeon: Cammie Sickle, MD;  Location: [place];  Service: Orthopedics;  Laterality: Left;  . COLONOSCOPY    . KNEE ARTHROSCOPY  [id number]   left and right  .  LUMBAR LAMINECTOMY/DECOMPRESSION MICRODISCECTOMY Left 09/29/2017   Procedure: LEFT LUMBAR TWO - LUMBAR THREELAMINOTOMY/MICRODISCECTOMY;  Surgeon: Jovita Gamma, MD;  Location: [place];  Service: Neurosurgery;  Laterality: Left;  LEFT LUMBAR 2- LUMBAR 3 LAMINOTOMY/MICRODISCECTOMY  . NASAL SINUS SURGERY  05/2015  . TOTAL KNEE ARTHROPLASTY Right 06/24/2016   Procedure: TOTAL KNEE ARTHROPLASTY;  Surgeon: Garald Balding, MD;  Location: [place];  Service: Orthopedics;  Laterality: Right;  . TRIGGER FINGER RELEASE Left 11/10/2013   Procedure: LEFT  INDEX A-1 PULLEY RELEASE;  Surgeon: Cammie Sickle, MD;  Location: [place];  Service: Orthopedics;  Laterality: Left;  . ULNAR NERVE TRANSPOSITION Left 11/10/2013   Procedure: ULNAR NERVE TRANSPOSITION;  Surgeon: Cammie Sickle, MD;  Location: [place];  Service: Orthopedics;  Laterality: Left;    Current Medications: Current Meds  Medication Sig  . acetaminophen (TYLENOL) 500 MG tablet Take 500 mg by mouth as needed.  Marland Kitchen aspirin EC 81 MG tablet Take 81 mg by mouth daily.  Marland Kitchen diltiazem (CARDIZEM CD) 240 MG 24 hr capsule Take 1 capsule (240 mg total) by mouth daily.  . diphenhydrAMINE (BENADRYL) 25 MG tablet Take 25 mg by mouth at bedtime as needed for allergies.  . fluticasone (FLONASE ALLERGY RELIEF) 50 MCG/ACT nasal spray as needed.  . Multiple Vitamin (MULTIVITAMIN WITH MINERALS) TABS tablet Take 1 tablet by mouth daily.  Marland Kitchen olmesartan (BENICAR) 40 MG tablet Take 40 mg by mouth daily.  Marland Kitchen omeprazole (PRILOSEC) 20 MG capsule Take 20 mg by mouth as needed.  Marland Kitchen tetrahydrozoline 0.05 % ophthalmic solution Place 1 drop into both eyes 3 (three) times daily as needed (for dry/irritated eyes.).  Marland Kitchen traMADol (ULTRAM) 50 MG tablet TK 1 T PO  Q 6 HOURS PRN     Allergies:   Mushroom extract complex, Penicillins, Dilaudid [hydromorphone], Bystolic [nebivolol hcl], Codeine, Iodine, and Metoprolol   Social History   Socioeconomic History  . Marital status: Married    Spouse name: [name]  . Number of children: 0  . Years of education: 50  . Highest education level: Not on file  Occupational History  . Occupation: Self employed  Tobacco Use  . Smoking status: Former Smoker    Packs/day: 1.00    Years: 37.00    Pack years: 37.00    Types: Cigarettes    Quit date: 05/04/2016    Years since quitting: 3.9  . Smokeless tobacco: Never Used  Vaping Use  . Vaping Use: Former  Substance and Sexual Activity  . Alcohol use: Yes    Alcohol/week: 0.0 standard  drinks    Comment: occ  . Drug use: No  . Sexual activity: Not on file  Other Topics Concern  . Not on file  Social History Narrative   Lives with wife, [name]   Caffeine use: Coffee daily   Right handed    Social Determinants of Health   Financial Resource Strain:   . Difficulty of Paying Living Expenses: Not on file  Food Insecurity:   . Worried About Charity fundraiser in the Last Year: Not on file  . Ran Out of Food in the Last Year: Not on file  Transportation Needs:   . Lack of Transportation (Medical): Not on file  . Lack of Transportation (Non-Medical): Not on file  Physical Activity:   . Days of Exercise per Week: Not on file  . Minutes of Exercise per Session: Not on file  Stress:   . Feeling  of Stress : Not on file  Social Connections:   . Frequency of Communication with Friends and Family: Not on file  . Frequency of Social Gatherings with Friends and Family: Not on file  . Attends Religious Services: Not on file  . Active Member of Clubs or Organizations: Not on file  . Attends Archivist Meetings: Not on file  . Marital Status: Not on file     Family History: The patient's family history is not on file.  Father may have had heart problems.  ROS:   Please see the history of present illness.    No fevers chills nausea vomiting syncope bleeding all other systems reviewed and are negative.  EKGs/Labs/Other Studies Reviewed:    The following studies were reviewed today:  Echocardiogram was also performed on 04/05/2020 in conjunction with nuclear stress test:  1. Left ventricular ejection fraction, by estimation, is 60 to 65%. The  left ventricle has normal function. The left ventricle has no regional  wall motion abnormalities. Left ventricular diastolic parameters are  consistent with Grade I diastolic  dysfunction (impaired relaxation).  2. Right ventricular systolic function is normal. The right ventricular  size is normal.  3. The  mitral valve is normal in structure. No evidence of mitral valve  regurgitation.  4. The aortic valve is calcified. There is moderate calcification of the  aortic valve. There is moderate thickening of the aortic valve. Aortic  valve regurgitation is not visualized. Mild aortic valve stenosis.  5. Aortic dilatation noted. There is mild to moderate dilatation of the  ascending aorta, measuring 40 mm.   EKG:  EKG is  ordered today.  The ekg ordered today demonstrates sinus rhythm with PACs heart rate 93 bpm  Recent Labs: No results found for requested labs within last 8760 hours.  Recent Lipid Panel No results found for: CHOL, TRIG, HDL, CHOLHDL, VLDL, LDLCALC, LDLDIRECT   Risk Assessment/Calculations:       Physical Exam:    VS:  BP (!) 150/90   Pulse 93   Ht 5\' 3"  (1.6 m)   Wt 158 lb (71.7 kg)   SpO2 95%   BMI 27.99 kg/m     Wt Readings from Last 3 Encounters:  04/06/20 158 lb (71.7 kg)  04/05/20 159 lb (72.1 kg)  04/03/20 159 lb (72.1 kg)     GEN:  Well nourished, well developed in no acute distress HEENT: Normal NECK: No JVD; No carotid bruits LYMPHATICS: No lymphadenopathy CARDIAC: RRR, 3/6 systolic murmur, no rubs, gallops RESPIRATORY:  Clear to auscultation without rales, wheezing or rhonchi  ABDOMEN: Soft, non-tender, non-distended MUSCULOSKELETAL:  No edema; No deformity  SKIN: Warm and dry NEUROLOGIC:  Alert and oriented x 3 PSYCHIATRIC:  Normal affect   ASSESSMENT:    1. Stable angina pectoris ([place])   2. Bilateral carotid artery stenosis   3. Essential hypertension   4. Aortic atherosclerosis ([place])   5. Dilated aortic root ([place])   6. Pre-procedure lab exam   7. Abnormal cardiovascular stress test   8. Nonrheumatic aortic valve stenosis   9. Statin intolerance    PLAN:    In order of problems listed above:  Angina/abnormal nuclear stress test/high risk concerning ischemia -We will go ahead and proceed with cardiac catheterization right and  left heart catheterization to evaluate his bicuspid aortic valve as well.  Risks and benefits of been explained. -Recommend aspirin 81 mg continuation -Recommend starting Crestor 20 mg once a day, however he is  intolerant to statins.  He is tried several of them.  We will refer him to our lipid clinic for PCSK9 inhibitor.  Bicuspid aortic valve with mild aortic stenosis -Calcified valve noted.  Peak velocity 2.8 m/s.  Mild range.  Check on heart catheterization.  Mildly dilated aortic root -40 mm noted on echocardiogram, stable.  Continue to monitor blood pressure  Abnormal EKG-tachycardia -Longstanding did not tolerate metoprolol well. -At last visit increased his diltiazem to help him with his hypertension as well as tachycardia.  Mild carotid artery disease -1 to 39% bilateral stenosis -Repeat study in 2 years.  Hyperlipidemia -Intolerant to statins.  Has tried many.  LDL 135 triglycerides 132 HDL 68 total cholesterol 226 on 02/06/2020.  Hemoglobin A1c 5.7 ALT 32 creatinine 0.73 hemoglobin 14.7 -Lipid clinic   Shared Decision Making/Informed Consent    The risks [stroke (1 in 1000), death (1 in September 04, 998), kidney failure [usually temporary] (1 in 500), bleeding (1 in 200), allergic reaction [possibly serious] (1 in 200)], benefits (diagnostic support and management of coronary artery disease) and alternatives of a cardiac catheterization were discussed in detail with Philip Winters and he is willing to proceed.        Medication Adjustments/Labs and Tests Ordered: Current medicines are reviewed at length with the patient today.  Concerns regarding medicines are outlined above.  Orders Placed This Encounter  Procedures  . CBC  . Basic metabolic panel  . AMB Referral to [facility]  . EKG 12-Lead   No orders of the defined types were placed in this encounter.   Patient Instructions  Medication Instructions:  The current medical regimen is effective;  continue  present plan and medications.  *If you need a refill on your cardiac medications before your next appointment, please call your pharmacy*   Lab Work: Please have blood work today (CBC, BMP)  If you have labs (blood work) drawn today and your tests are completely normal, you will receive your results only by: Marland Kitchen MyChart Message (if you have MyChart) OR . A paper copy in the mail If you have any lab test that is abnormal or we need to change your treatment, we will call you to review the results.  You will need Covid screening as scheduled.  Once you have the screening you will need to self quarantine at home until your procedure.  Testing/Procedures:   [place] OFFICE [place], [place] [address] Dept: [place]: [phone]  Philip Winters  04/06/2020  You are scheduled for a cardiac cath on Tuesday April 10, 2020 with Dr. Claiborne Billings.  1. Please arrive at the [facility] (Main Entrance A) at [facility]: [address] [place], [place] [address] at 5:30 am  (two hours before your procedure to ensure your preparation). Free valet parking service is available.   Special note: Every effort is made to have your procedure done on time. Please understand that emergencies sometimes delay scheduled procedures.  2. Diet: Nothing after midnight the night before except your medications with sips of water.  3. Labs: have today (CBC, BMP)     Covid testing as scheduled.  4. Medication instructions in preparation for your procedure:  On the morning of your procedure, take your Aspirin and any morning medicines NOT listed above.  You may use sips of water.  5. Plan for one night stay--bring personal belongings. 6. Bring a current list  of your medications and current insurance cards. 7. You MUST have a responsible person to drive you home. 8. Someone MUST be with you the first 24  hours after you arrive home or your discharge will be delayed. 9. Please wear clothes that are easy to get on and off and wear slip-on shoes.  Thank you for allowing [name] to care for you!   -- [facility] [facility]  Follow-Up: At [facility], you and your health needs are our priority.  As part of our continuing mission to provide you with exceptional heart care, we have created designated Provider Care Teams.  These Care Teams include your primary Cardiologist (physician) and Advanced Practice Providers (APPs -  Physician Assistants and Nurse Practitioners) who all work together to provide you with the care you need, when you need it.  We recommend signing up for the patient portal called "MyChart".  Sign up information is provided on this After Visit Summary.  MyChart is used to connect with patients for Virtual Visits (Telemedicine).  Patients are able to view lab/test results, encounter notes, upcoming appointments, etc.  Non-urgent messages can be sent to your provider as well.   To learn more about what you can do with MyChart, go to [email or web address].     You have been referred to the [facility].  Your next appointment:   3 week(s)  The format for your next appointment:   In Person  Provider:   Candee Furbish, MD   Thank you for choosing [facility]!!         Signed, Candee Furbish, MD  04/06/2020 9:09 AM    [place]

## 2020-04-06 NOTE — H&P (View-Only) (Signed)
Cardiology Office Note:    Date:  04/06/2020   ID:  Philip Winters, DOB Oct 15, 1960, MRN [id number]  PCP:  Antony Contras, MD  [facility] Cardiologist:  Candee Furbish, MD  [facility] Electrophysiologist:  None   Referring MD: Antony Contras, MD     History of Present Illness:    Philip Winters is a 59 y.o. male here to discuss abnormal nuclear stress test.  I had seen him on 03/08/2020 where he stated he had sensation of shortness of breath and chest burning when going up hills relieved with rest that worried him.  A couple of his friends have had stent placements recently.  He is also been battling with some back pain and seeing Dr. Sherwood Gambler.  His father had peripheral vascular disease, carotid artery disease.  He quit tobacco in 2017 after the use of Wellbutrin.  His EKG demonstrated sinus tachycardia with rate of 104-he states that this is not unusual for him to have a fast heart rate for most of his life.  Previously in 2019 I saw him originally for the evaluation of hypertension.  He did not tolerate beta-blocker class metoprolol.  Had been on olmesartan..  For instance, when he was on metoprolol his heart rate was in the 80s and he felt like he could not function he states.  Because of his shortness of breath with activity that was concerning for angina he underwent a nuclear stress test as below.  This was abnormal.  NUC stress 04/05/2020:  The left ventricular ejection fraction is mildly decreased (45-54%).  Nuclear stress EF: 50%.  Horizontal ST segment depression ST segment depression was noted during stress in the II, aVF and III leads.  Defect 1: There is a large defect of moderate severity present in the basal inferoseptal, mid anterior, mid anteroseptal, mid inferoseptal, apical anterior, apical septal and apex location.  Findings consistent with prior myocardial infarction with peri-infarct ischemia.  This is a high risk study.   Abnormal study, high risk. There  is a large defect as noted. The apex, apical anterior/anteroseptal and mid anterior/anteroseptal portions of the defect are very reversible, consistent with ischemia. The inferior/inferoseptal portions appear to be more of a fixed defect with partial reversibility. There is no clear focal wall motion abnormalities on the images, but overall EF is mildly reduced   Taking GERD medication.  Heartburn may be anginal symptoms.  Also on echocardiogram personally reviewed, appears to have a bicuspid aortic valve that is calcified.  Mild aortic stenosis, peak velocity 2.8 m/s.  He is a Development worker, community.  Classic symptoms when carrying his 80 pound toolbox upstairs.  He has had trouble in the past trying to take statin therapy.  Intolerance.  Past Medical History:  Diagnosis Date  . Allergy   . Arthritis   . GERD (gastroesophageal reflux disease)    occ  . Heart murmur    baby  . High cholesterol   . Hypertension   . Pneumonia   . Right carotid bruit 06/10/2017  . Sciatica   . Seasonal allergies   . Sinus tachycardia   . Spinal stenosis     Past Surgical History:  Procedure Laterality Date  . CARPAL TUNNEL WITH CUBITAL TUNNEL Left 11/10/2013   Procedure: LEFT CARPAL TUNNEL RELEASE;  Surgeon: Cammie Sickle, MD;  Location: [place];  Service: Orthopedics;  Laterality: Left;  . COLONOSCOPY    . KNEE ARTHROSCOPY  [id number]   left and right  .  LUMBAR LAMINECTOMY/DECOMPRESSION MICRODISCECTOMY Left 09/29/2017   Procedure: LEFT LUMBAR TWO - LUMBAR THREELAMINOTOMY/MICRODISCECTOMY;  Surgeon: Jovita Gamma, MD;  Location: [place];  Service: Neurosurgery;  Laterality: Left;  LEFT LUMBAR 2- LUMBAR 3 LAMINOTOMY/MICRODISCECTOMY  . NASAL SINUS SURGERY  05/2015  . TOTAL KNEE ARTHROPLASTY Right 06/24/2016   Procedure: TOTAL KNEE ARTHROPLASTY;  Surgeon: Garald Balding, MD;  Location: [place];  Service: Orthopedics;  Laterality: Right;  . TRIGGER FINGER RELEASE Left 11/10/2013   Procedure: LEFT  INDEX A-1 PULLEY RELEASE;  Surgeon: Cammie Sickle, MD;  Location: [place];  Service: Orthopedics;  Laterality: Left;  . ULNAR NERVE TRANSPOSITION Left 11/10/2013   Procedure: ULNAR NERVE TRANSPOSITION;  Surgeon: Cammie Sickle, MD;  Location: [place];  Service: Orthopedics;  Laterality: Left;    Current Medications: Current Meds  Medication Sig  . acetaminophen (TYLENOL) 500 MG tablet Take 500 mg by mouth as needed.  Marland Kitchen aspirin EC 81 MG tablet Take 81 mg by mouth daily.  Marland Kitchen diltiazem (CARDIZEM CD) 240 MG 24 hr capsule Take 1 capsule (240 mg total) by mouth daily.  . diphenhydrAMINE (BENADRYL) 25 MG tablet Take 25 mg by mouth at bedtime as needed for allergies.  . fluticasone (FLONASE ALLERGY RELIEF) 50 MCG/ACT nasal spray as needed.  . Multiple Vitamin (MULTIVITAMIN WITH MINERALS) TABS tablet Take 1 tablet by mouth daily.  Marland Kitchen olmesartan (BENICAR) 40 MG tablet Take 40 mg by mouth daily.  Marland Kitchen omeprazole (PRILOSEC) 20 MG capsule Take 20 mg by mouth as needed.  Marland Kitchen tetrahydrozoline 0.05 % ophthalmic solution Place 1 drop into both eyes 3 (three) times daily as needed (for dry/irritated eyes.).  Marland Kitchen traMADol (ULTRAM) 50 MG tablet TK 1 T PO  Q 6 HOURS PRN     Allergies:   Mushroom extract complex, Penicillins, Dilaudid [hydromorphone], Bystolic [nebivolol hcl], Codeine, Iodine, and Metoprolol   Social History   Socioeconomic History  . Marital status: Married    Spouse name: [name]  . Number of children: 0  . Years of education: 43  . Highest education level: Not on file  Occupational History  . Occupation: Self employed  Tobacco Use  . Smoking status: Former Smoker    Packs/day: 1.00    Years: 37.00    Pack years: 37.00    Types: Cigarettes    Quit date: 05/04/2016    Years since quitting: 3.9  . Smokeless tobacco: Never Used  Vaping Use  . Vaping Use: Former  Substance and Sexual Activity  . Alcohol use: Yes    Alcohol/week: 0.0 standard  drinks    Comment: occ  . Drug use: No  . Sexual activity: Not on file  Other Topics Concern  . Not on file  Social History Narrative   Lives with wife, [name]   Caffeine use: Coffee daily   Right handed    Social Determinants of Health   Financial Resource Strain:   . Difficulty of Paying Living Expenses: Not on file  Food Insecurity:   . Worried About Charity fundraiser in the Last Year: Not on file  . Ran Out of Food in the Last Year: Not on file  Transportation Needs:   . Lack of Transportation (Medical): Not on file  . Lack of Transportation (Non-Medical): Not on file  Physical Activity:   . Days of Exercise per Week: Not on file  . Minutes of Exercise per Session: Not on file  Stress:   . Feeling  of Stress : Not on file  Social Connections:   . Frequency of Communication with Friends and Family: Not on file  . Frequency of Social Gatherings with Friends and Family: Not on file  . Attends Religious Services: Not on file  . Active Member of Clubs or Organizations: Not on file  . Attends Archivist Meetings: Not on file  . Marital Status: Not on file     Family History: The patient's family history is not on file.  Father may have had heart problems.  ROS:   Please see the history of present illness.    No fevers chills nausea vomiting syncope bleeding all other systems reviewed and are negative.  EKGs/Labs/Other Studies Reviewed:    The following studies were reviewed today:  Echocardiogram was also performed on 04/05/2020 in conjunction with nuclear stress test:  1. Left ventricular ejection fraction, by estimation, is 60 to 65%. The  left ventricle has normal function. The left ventricle has no regional  wall motion abnormalities. Left ventricular diastolic parameters are  consistent with Grade I diastolic  dysfunction (impaired relaxation).  2. Right ventricular systolic function is normal. The right ventricular  size is normal.  3. The  mitral valve is normal in structure. No evidence of mitral valve  regurgitation.  4. The aortic valve is calcified. There is moderate calcification of the  aortic valve. There is moderate thickening of the aortic valve. Aortic  valve regurgitation is not visualized. Mild aortic valve stenosis.  5. Aortic dilatation noted. There is mild to moderate dilatation of the  ascending aorta, measuring 40 mm.   EKG:  EKG is  ordered today.  The ekg ordered today demonstrates sinus rhythm with PACs heart rate 93 bpm  Recent Labs: No results found for requested labs within last 8760 hours.  Recent Lipid Panel No results found for: CHOL, TRIG, HDL, CHOLHDL, VLDL, LDLCALC, LDLDIRECT   Risk Assessment/Calculations:       Physical Exam:    VS:  BP (!) 150/90   Pulse 93   Ht 5\' 3"  (1.6 m)   Wt 158 lb (71.7 kg)   SpO2 95%   BMI 27.99 kg/m     Wt Readings from Last 3 Encounters:  04/06/20 158 lb (71.7 kg)  04/05/20 159 lb (72.1 kg)  04/03/20 159 lb (72.1 kg)     GEN:  Well nourished, well developed in no acute distress HEENT: Normal NECK: No JVD; No carotid bruits LYMPHATICS: No lymphadenopathy CARDIAC: RRR, 3/6 systolic murmur, no rubs, gallops RESPIRATORY:  Clear to auscultation without rales, wheezing or rhonchi  ABDOMEN: Soft, non-tender, non-distended MUSCULOSKELETAL:  No edema; No deformity  SKIN: Warm and dry NEUROLOGIC:  Alert and oriented x 3 PSYCHIATRIC:  Normal affect   ASSESSMENT:    1. Stable angina pectoris ([place])   2. Bilateral carotid artery stenosis   3. Essential hypertension   4. Aortic atherosclerosis ([place])   5. Dilated aortic root ([place])   6. Pre-procedure lab exam   7. Abnormal cardiovascular stress test   8. Nonrheumatic aortic valve stenosis   9. Statin intolerance    PLAN:    In order of problems listed above:  Angina/abnormal nuclear stress test/high risk concerning ischemia -We will go ahead and proceed with cardiac catheterization right and  left heart catheterization to evaluate his bicuspid aortic valve as well.  Risks and benefits of been explained. -Recommend aspirin 81 mg continuation -Recommend starting Crestor 20 mg once a day, however he is  intolerant to statins.  He is tried several of them.  We will refer him to our lipid clinic for PCSK9 inhibitor.  Bicuspid aortic valve with mild aortic stenosis -Calcified valve noted.  Peak velocity 2.8 m/s.  Mild range.  Check on heart catheterization.  Mildly dilated aortic root -40 mm noted on echocardiogram, stable.  Continue to monitor blood pressure  Abnormal EKG-tachycardia -Longstanding did not tolerate metoprolol well. -At last visit increased his diltiazem to help him with his hypertension as well as tachycardia.  Mild carotid artery disease -1 to 39% bilateral stenosis -Repeat study in 2 years.  Hyperlipidemia -Intolerant to statins.  Has tried many.  LDL 135 triglycerides 132 HDL 68 total cholesterol 226 on 02/06/2020.  Hemoglobin A1c 5.7 ALT 32 creatinine 0.73 hemoglobin 14.7 -Lipid clinic   Shared Decision Making/Informed Consent    The risks [stroke (1 in 1000), death (1 in 08-23-998), kidney failure [usually temporary] (1 in 500), bleeding (1 in 200), allergic reaction [possibly serious] (1 in 200)], benefits (diagnostic support and management of coronary artery disease) and alternatives of a cardiac catheterization were discussed in detail with Mr. Carothers and he is willing to proceed.        Medication Adjustments/Labs and Tests Ordered: Current medicines are reviewed at length with the patient today.  Concerns regarding medicines are outlined above.  Orders Placed This Encounter  Procedures  . CBC  . Basic metabolic panel  . AMB Referral to [facility]  . EKG 12-Lead   No orders of the defined types were placed in this encounter.   Patient Instructions  Medication Instructions:  The current medical regimen is effective;  continue  present plan and medications.  *If you need a refill on your cardiac medications before your next appointment, please call your pharmacy*   Lab Work: Please have blood work today (CBC, BMP)  If you have labs (blood work) drawn today and your tests are completely normal, you will receive your results only by: Marland Kitchen MyChart Message (if you have MyChart) OR . A paper copy in the mail If you have any lab test that is abnormal or we need to change your treatment, we will call you to review the results.  You will need Covid screening as scheduled.  Once you have the screening you will need to self quarantine at home until your procedure.  Testing/Procedures:   [place] OFFICE [place], [place] [address] Dept: [place]: [phone]  [facility]  04/06/2020  You are scheduled for a cardiac cath on Tuesday April 10, 2020 with Dr. Claiborne Billings.  1. Please arrive at the [facility] (Main Entrance A) at [facility]: [address] [place], [place] [address] at 5:30 am  (two hours before your procedure to ensure your preparation). Free valet parking service is available.   Special note: Every effort is made to have your procedure done on time. Please understand that emergencies sometimes delay scheduled procedures.  2. Diet: Nothing after midnight the night before except your medications with sips of water.  3. Labs: have today (CBC, BMP)     Covid testing as scheduled.  4. Medication instructions in preparation for your procedure:  On the morning of your procedure, take your Aspirin and any morning medicines NOT listed above.  You may use sips of water.  5. Plan for one night stay--bring personal belongings. 6. Bring a current list  of your medications and current insurance cards. 7. You MUST have a responsible person to drive you home. 8. Someone MUST be with you the first 24  hours after you arrive home or your discharge will be delayed. 9. Please wear clothes that are easy to get on and off and wear slip-on shoes.  Thank you for allowing [name] to care for you!   -- [place] [facility]  Follow-Up: At [facility], you and your health needs are our priority.  As part of our continuing mission to provide you with exceptional heart care, we have created designated Provider Care Teams.  These Care Teams include your primary Cardiologist (physician) and Advanced Practice Providers (APPs -  Physician Assistants and Nurse Practitioners) who all work together to provide you with the care you need, when you need it.  We recommend signing up for the patient portal called "MyChart".  Sign up information is provided on this After Visit Summary.  MyChart is used to connect with patients for Virtual Visits (Telemedicine).  Patients are able to view lab/test results, encounter notes, upcoming appointments, etc.  Non-urgent messages can be sent to your provider as well.   To learn more about what you can do with MyChart, go to [email or web address].     You have been referred to the [facility].  Your next appointment:   3 week(s)  The format for your next appointment:   In Person  Provider:   Candee Furbish, MD   Thank you for choosing [facility]!!         Signed, Candee Furbish, MD  04/06/2020 9:09 AM    [place]

## 2020-04-07 ENCOUNTER — Other Ambulatory Visit (HOSPITAL_COMMUNITY)
Admission: RE | Admit: 2020-04-07 | Discharge: 2020-04-07 | Disposition: A | Discharge status: Home / Self Care | Payer: BC Managed Care – PPO | Source: Ambulatory Visit | Attending: Cardiovascular Disease | Admitting: Cardiovascular Disease

## 2020-04-07 DIAGNOSIS — I6523 Occlusion and stenosis of bilateral carotid arteries: Secondary | ICD-10-CM | POA: Diagnosis not present

## 2020-04-07 DIAGNOSIS — Z79899 Other long term (current) drug therapy: Secondary | ICD-10-CM | POA: Diagnosis not present

## 2020-04-07 DIAGNOSIS — I350 Nonrheumatic aortic (valve) stenosis: Secondary | ICD-10-CM | POA: Diagnosis not present

## 2020-04-07 DIAGNOSIS — Z20822 Contact with and (suspected) exposure to covid-19: Secondary | ICD-10-CM | POA: Insufficient documentation

## 2020-04-07 DIAGNOSIS — Z87891 Personal history of nicotine dependence: Secondary | ICD-10-CM | POA: Diagnosis not present

## 2020-04-07 DIAGNOSIS — Z01812 Encounter for preprocedural laboratory examination: Secondary | ICD-10-CM | POA: Insufficient documentation

## 2020-04-07 DIAGNOSIS — Z880 Allergy status to penicillin: Secondary | ICD-10-CM | POA: Diagnosis not present

## 2020-04-07 DIAGNOSIS — I25118 Atherosclerotic heart disease of native coronary artery with other forms of angina pectoris: Secondary | ICD-10-CM | POA: Diagnosis not present

## 2020-04-07 DIAGNOSIS — Z885 Allergy status to narcotic agent status: Secondary | ICD-10-CM | POA: Diagnosis not present

## 2020-04-07 DIAGNOSIS — R9439 Abnormal result of other cardiovascular function study: Secondary | ICD-10-CM | POA: Diagnosis not present

## 2020-04-07 DIAGNOSIS — E785 Hyperlipidemia, unspecified: Secondary | ICD-10-CM | POA: Diagnosis not present

## 2020-04-07 DIAGNOSIS — I700 Atherosclerosis of aorta: Secondary | ICD-10-CM | POA: Diagnosis not present

## 2020-04-07 DIAGNOSIS — I10 Essential (primary) hypertension: Secondary | ICD-10-CM | POA: Diagnosis not present

## 2020-04-07 DIAGNOSIS — Z7982 Long term (current) use of aspirin: Secondary | ICD-10-CM | POA: Diagnosis not present

## 2020-04-07 DIAGNOSIS — Z888 Allergy status to other drugs, medicaments and biological substances status: Secondary | ICD-10-CM | POA: Diagnosis not present

## 2020-04-07 LAB — SARS CORONAVIRUS 2 (TAT 6-24 HRS): SARS Coronavirus 2: NEGATIVE

## 2020-04-09 ENCOUNTER — Telehealth: Payer: Self-pay | Admitting: *Deleted

## 2020-04-09 NOTE — Telephone Encounter (Signed)
Pt contacted pre-catheterization scheduled at [facility] for: Tuesday April 10, 2020 7:30 AM Verified arrival time and place: [facility]) at: 5:30 AM   No solid food after midnight prior to cath, clear liquids until 5 AM day of procedure.   AM meds can be  taken pre-cath with sips of water including: ASA 81 mg   Confirmed patient has responsible adult to drive home post procedure and be with patient first 24 hours after arriving home: yes  You are allowed ONE visitor in the waiting room during the time you are at the hospital for your procedure. Both you and your visitor must wear a mask once you enter the hospital.       COVID-19 Pre-Screening Questions:  . In the past 14 days have you had any symptoms concerning for COVID-19 infection (fever, chills, cough, or new shortness of breath)? no . In the past 14 days have you been around anyone with known Covid 19? no                      Reviewed procedure/mask/visitor instructions, COVID-19 questions with patient.             Pt has iodine listed as allergy-pt states this is allergy to topical iodine that caused a rash used when he was a child.

## 2020-04-10 ENCOUNTER — Encounter (HOSPITAL_COMMUNITY)
Admission: RE | Disposition: A | Discharge status: Home / Self Care | Payer: Self-pay | Source: Home / Self Care | Attending: Cardiovascular Disease

## 2020-04-10 ENCOUNTER — Ambulatory Visit (HOSPITAL_COMMUNITY)
Admission: RE | Admit: 2020-04-10 | Discharge: 2020-04-10 | Disposition: A | Discharge status: Home / Self Care | Payer: BC Managed Care – PPO | Attending: Cardiovascular Disease | Admitting: Cardiovascular Disease

## 2020-04-10 DIAGNOSIS — E785 Hyperlipidemia, unspecified: Secondary | ICD-10-CM | POA: Diagnosis not present

## 2020-04-10 DIAGNOSIS — I6523 Occlusion and stenosis of bilateral carotid arteries: Secondary | ICD-10-CM | POA: Diagnosis not present

## 2020-04-10 DIAGNOSIS — Z880 Allergy status to penicillin: Secondary | ICD-10-CM | POA: Insufficient documentation

## 2020-04-10 DIAGNOSIS — Z888 Allergy status to other drugs, medicaments and biological substances status: Secondary | ICD-10-CM | POA: Diagnosis not present

## 2020-04-10 DIAGNOSIS — I350 Nonrheumatic aortic (valve) stenosis: Secondary | ICD-10-CM

## 2020-04-10 DIAGNOSIS — Z79899 Other long term (current) drug therapy: Secondary | ICD-10-CM | POA: Insufficient documentation

## 2020-04-10 DIAGNOSIS — I10 Essential (primary) hypertension: Secondary | ICD-10-CM | POA: Diagnosis not present

## 2020-04-10 DIAGNOSIS — I2510 Atherosclerotic heart disease of native coronary artery without angina pectoris: Secondary | ICD-10-CM | POA: Diagnosis not present

## 2020-04-10 DIAGNOSIS — Z885 Allergy status to narcotic agent status: Secondary | ICD-10-CM | POA: Diagnosis not present

## 2020-04-10 DIAGNOSIS — R0609 Other forms of dyspnea: Secondary | ICD-10-CM

## 2020-04-10 DIAGNOSIS — Z20822 Contact with and (suspected) exposure to covid-19: Secondary | ICD-10-CM | POA: Insufficient documentation

## 2020-04-10 DIAGNOSIS — Z7982 Long term (current) use of aspirin: Secondary | ICD-10-CM | POA: Insufficient documentation

## 2020-04-10 DIAGNOSIS — I25118 Atherosclerotic heart disease of native coronary artery with other forms of angina pectoris: Secondary | ICD-10-CM | POA: Diagnosis not present

## 2020-04-10 DIAGNOSIS — I700 Atherosclerosis of aorta: Secondary | ICD-10-CM | POA: Diagnosis not present

## 2020-04-10 DIAGNOSIS — R9439 Abnormal result of other cardiovascular function study: Secondary | ICD-10-CM | POA: Diagnosis not present

## 2020-04-10 DIAGNOSIS — I208 Other forms of angina pectoris: Secondary | ICD-10-CM

## 2020-04-10 DIAGNOSIS — I2089 Other forms of angina pectoris: Secondary | ICD-10-CM

## 2020-04-10 DIAGNOSIS — Z789 Other specified health status: Secondary | ICD-10-CM

## 2020-04-10 DIAGNOSIS — R0600 Dyspnea, unspecified: Secondary | ICD-10-CM

## 2020-04-10 DIAGNOSIS — I77810 Thoracic aortic ectasia: Secondary | ICD-10-CM

## 2020-04-10 DIAGNOSIS — Z87891 Personal history of nicotine dependence: Secondary | ICD-10-CM | POA: Insufficient documentation

## 2020-04-10 DIAGNOSIS — Z01812 Encounter for preprocedural laboratory examination: Secondary | ICD-10-CM

## 2020-04-10 HISTORY — PX: RIGHT/LEFT HEART CATH AND CORONARY ANGIOGRAPHY: CATH118266

## 2020-04-10 LAB — POCT I-STAT EG7
Acid-Base Excess: 0 mmol/L (ref 0.0–2.0)
Acid-Base Excess: 1 mmol/L (ref 0.0–2.0)
Bicarbonate: 25 mmol/L (ref 20.0–28.0)
Bicarbonate: 25.5 mmol/L (ref 20.0–28.0)
Calcium, Ion: 1.21 mmol/L (ref 1.15–1.40)
Calcium, Ion: 1.22 mmol/L (ref 1.15–1.40)
HCT: 39 % (ref 39.0–52.0)
HCT: 39 % (ref 39.0–52.0)
Hemoglobin: 13.3 g/dL (ref 13.0–17.0)
Hemoglobin: 13.3 g/dL (ref 13.0–17.0)
O2 Saturation: 74 %
O2 Saturation: 75 %
Potassium: 4.3 mmol/L (ref 3.5–5.1)
Potassium: 4.3 mmol/L (ref 3.5–5.1)
Sodium: 138 mmol/L (ref 135–145)
Sodium: 138 mmol/L (ref 135–145)
TCO2: 26 mmol/L (ref 22–32)
TCO2: 27 mmol/L (ref 22–32)
pCO2, Ven: 40.9 mmHg — ABNORMAL LOW (ref 44.0–60.0)
pCO2, Ven: 41 mmHg — ABNORMAL LOW (ref 44.0–60.0)
pH, Ven: 7.395 (ref 7.250–7.430)
pH, Ven: 7.403 (ref 7.250–7.430)
pO2, Ven: 40 mmHg (ref 32.0–45.0)
pO2, Ven: 40 mmHg (ref 32.0–45.0)

## 2020-04-10 LAB — POCT I-STAT 7, (LYTES, BLD GAS, ICA,H+H)
Acid-base deficit: 3 mmol/L — ABNORMAL HIGH (ref 0.0–2.0)
Bicarbonate: 22.8 mmol/L (ref 20.0–28.0)
Calcium, Ion: 1.18 mmol/L (ref 1.15–1.40)
HCT: 37 % — ABNORMAL LOW (ref 39.0–52.0)
Hemoglobin: 12.6 g/dL — ABNORMAL LOW (ref 13.0–17.0)
O2 Saturation: 94 %
Potassium: 4 mmol/L (ref 3.5–5.1)
Sodium: 131 mmol/L — ABNORMAL LOW (ref 135–145)
TCO2: 24 mmol/L (ref 22–32)
pCO2 arterial: 40.7 mmHg (ref 32.0–48.0)
pH, Arterial: 7.356 (ref 7.350–7.450)
pO2, Arterial: 72 mmHg — ABNORMAL LOW (ref 83.0–108.0)

## 2020-04-10 SURGERY — RIGHT/LEFT HEART CATH AND CORONARY ANGIOGRAPHY
Anesthesia: LOCAL

## 2020-04-10 MED ORDER — SODIUM CHLORIDE 0.9 % IV SOLN: 250.0000 mL | INTRAVENOUS | Status: DC | PRN

## 2020-04-10 MED ORDER — SODIUM CHLORIDE 0.9% FLUSH: 3.0000 mL | INTRAVENOUS | Status: DC | PRN

## 2020-04-10 MED ORDER — SODIUM CHLORIDE 0.9 % WEIGHT BASED INFUSION: 1.0000 mL/kg/h | INTRAVENOUS | Status: DC

## 2020-04-10 MED ORDER — VERAPAMIL HCL 2.5 MG/ML IV SOLN
INTRAVENOUS | Status: DC | PRN
  Administered 2020-04-10: 10 mL via INTRA_ARTERIAL

## 2020-04-10 MED ORDER — VERAPAMIL HCL 2.5 MG/ML IV SOLN
INTRAVENOUS | Status: AC
  Filled 2020-04-10: qty 2

## 2020-04-10 MED ORDER — FENTANYL CITRATE (PF) 100 MCG/2ML IJ SOLN
INTRAMUSCULAR | Status: AC
  Filled 2020-04-10: qty 2

## 2020-04-10 MED ORDER — SODIUM CHLORIDE 0.9 % IV SOLN: INTRAVENOUS | Status: DC

## 2020-04-10 MED ORDER — METHYLPREDNISOLONE SODIUM SUCC 125 MG IJ SOLR
125.0000 mg | Freq: Once | INTRAMUSCULAR | Status: AC
  Administered 2020-04-10: 125 mg via INTRAVENOUS
  Filled 2020-04-10: qty 2

## 2020-04-10 MED ORDER — MIDAZOLAM HCL 2 MG/2ML IJ SOLN
INTRAMUSCULAR | Status: AC
  Filled 2020-04-10: qty 2

## 2020-04-10 MED ORDER — HEPARIN (PORCINE) IN NACL 1000-0.9 UT/500ML-% IV SOLN
INTRAVENOUS | Status: DC | PRN
  Administered 2020-04-10 (×2): 500 mL

## 2020-04-10 MED ORDER — HEPARIN SODIUM (PORCINE) 1000 UNIT/ML IJ SOLN
INTRAMUSCULAR | Status: AC
  Filled 2020-04-10: qty 1

## 2020-04-10 MED ORDER — LIDOCAINE HCL (PF) 1 % IJ SOLN
INTRAMUSCULAR | Status: DC | PRN
  Administered 2020-04-10 (×2): 2 mL via INTRADERMAL

## 2020-04-10 MED ORDER — ONDANSETRON HCL 4 MG/2ML IJ SOLN: 4.0000 mg | Freq: Four times a day (QID) | INTRAMUSCULAR | Status: DC | PRN

## 2020-04-10 MED ORDER — ACETAMINOPHEN 325 MG PO TABS: 650.0000 mg | ORAL_TABLET | ORAL | Status: DC | PRN

## 2020-04-10 MED ORDER — FENTANYL CITRATE (PF) 100 MCG/2ML IJ SOLN
INTRAMUSCULAR | Status: DC | PRN
  Administered 2020-04-10: 25 ug via INTRAVENOUS
  Administered 2020-04-10: 50 ug via INTRAVENOUS

## 2020-04-10 MED ORDER — HEPARIN (PORCINE) IN NACL 1000-0.9 UT/500ML-% IV SOLN
INTRAVENOUS | Status: AC
  Filled 2020-04-10: qty 1000

## 2020-04-10 MED ORDER — SODIUM CHLORIDE 0.9 % WEIGHT BASED INFUSION
3.0000 mL/kg/h | INTRAVENOUS | Status: AC
  Administered 2020-04-10: 3 mL/kg/h via INTRAVENOUS

## 2020-04-10 MED ORDER — SODIUM CHLORIDE 0.9% FLUSH: 3.0000 mL | Freq: Two times a day (BID) | INTRAVENOUS | Status: DC

## 2020-04-10 MED ORDER — HYDRALAZINE HCL 20 MG/ML IJ SOLN: 10.0000 mg | INTRAMUSCULAR | Status: DC | PRN

## 2020-04-10 MED ORDER — LABETALOL HCL 5 MG/ML IV SOLN: 10.0000 mg | INTRAVENOUS | Status: DC | PRN

## 2020-04-10 MED ORDER — IOHEXOL 350 MG/ML SOLN
INTRAVENOUS | Status: DC | PRN
  Administered 2020-04-10: 95 mL via INTRA_ARTERIAL

## 2020-04-10 MED ORDER — LIDOCAINE HCL (PF) 1 % IJ SOLN
INTRAMUSCULAR | Status: AC
  Filled 2020-04-10: qty 30

## 2020-04-10 MED ORDER — HEPARIN SODIUM (PORCINE) 1000 UNIT/ML IJ SOLN
INTRAMUSCULAR | Status: DC | PRN
  Administered 2020-04-10: 3700 [IU] via INTRAVENOUS

## 2020-04-10 MED ORDER — DIPHENHYDRAMINE HCL 50 MG/ML IJ SOLN
25.0000 mg | Freq: Once | INTRAMUSCULAR | Status: AC
  Administered 2020-04-10: 25 mg via INTRAVENOUS
  Filled 2020-04-10: qty 1

## 2020-04-10 MED ORDER — MIDAZOLAM HCL 2 MG/2ML IJ SOLN
INTRAMUSCULAR | Status: DC | PRN
  Administered 2020-04-10: 1 mg via INTRAVENOUS
  Administered 2020-04-10: 2 mg via INTRAVENOUS

## 2020-04-10 MED ORDER — DIAZEPAM 5 MG PO TABS: 5.0000 mg | ORAL_TABLET | Freq: Four times a day (QID) | ORAL | Status: DC | PRN

## 2020-04-10 MED ORDER — ASPIRIN 81 MG PO CHEW: 81.0000 mg | CHEWABLE_TABLET | ORAL | Status: DC

## 2020-04-10 MED ORDER — ASPIRIN 81 MG PO CHEW: 81.0000 mg | CHEWABLE_TABLET | Freq: Every day | ORAL | Status: DC

## 2020-04-10 SURGICAL SUPPLY — 15 items
CATH INFINITI 5FR AL1 (CATHETERS) ×2 IMPLANT
CATH INFINITI JR4 5F (CATHETERS) ×2 IMPLANT
CATH OPTITORQUE TIG 4.0 5F (CATHETERS) ×2 IMPLANT
CATH SWAN GANZ 7F STRAIGHT (CATHETERS) ×2 IMPLANT
DEVICE RAD COMP TR BAND LRG (VASCULAR PRODUCTS) ×2 IMPLANT
GLIDESHEATH SLEND SS 6F .021 (SHEATH) ×2 IMPLANT
GLIDESHEATH SLENDER 7FR .021G (SHEATH) ×2 IMPLANT
GUIDEWIRE .025 260CM (WIRE) ×2 IMPLANT
GUIDEWIRE INQWIRE 1.5J.035X260 (WIRE) ×2 IMPLANT
INQWIRE 1.5J .035X260CM (WIRE) ×4
KIT HEART LEFT (KITS) ×2 IMPLANT
PACK CARDIAC CATHETERIZATION (CUSTOM PROCEDURE TRAY) ×2 IMPLANT
TRANSDUCER W/STOPCOCK (MISCELLANEOUS) ×2 IMPLANT
TUBING CIL FLEX 10 FLL-RA (TUBING) ×2 IMPLANT
WIRE EMERALD ST .035X150CM (WIRE) ×2 IMPLANT

## 2020-04-10 NOTE — Research (Signed)
PHDEV Informed Consent   Subject Name: Philip Winters  Subject met inclusion and exclusion criteria.  The informed consent form, study requirements and expectations were reviewed with the subject and questions and concerns were addressed prior to the signing of the consent form.  The subject verbalized understanding of the trail requirements.  The subject agreed to participate in the PHDEV trial and signed the informed consent.  The informed consent was obtained prior to performance of any protocol-specific procedures for the subject.  A copy of the signed informed consent was given to the subject and a copy was placed in the subject's medical record.  LUTTERLOH, KIMBERLY D 04/10/2020, 0705 am  

## 2020-04-10 NOTE — Discharge Instructions (Signed)

## 2020-04-10 NOTE — Interval H&P Note (Signed)
Cath Lab Visit (complete for each Cath Lab visit)  Clinical Evaluation Leading to the Procedure:   ACS: No.  Non-ACS:    Anginal Classification: CCS III  Anti-ischemic medical therapy: Minimal Therapy (1 class of medications)  Non-Invasive Test Results: High-risk stress test findings: cardiac mortality >3%/year  Prior CABG: No previous CABG      History and Physical Interval Note:  04/10/2020 7:36 AM  Philip Winters  has presented today for surgery, with the diagnosis of Abnormal stress test.  The various methods of treatment have been discussed with the patient and family. After consideration of risks, benefits and other options for treatment, the patient has consented to  Procedure(s): RIGHT/LEFT HEART CATH AND CORONARY ANGIOGRAPHY (N/A) as a surgical intervention.  The patient's history has been reviewed, patient examined, no change in status, stable for surgery.  I have reviewed the patient's chart and labs.  Questions were answered to the patient's satisfaction.     [name]

## 2020-04-11 ENCOUNTER — Telehealth: Payer: Self-pay | Admitting: Cardiology

## 2020-04-11 ENCOUNTER — Encounter (HOSPITAL_COMMUNITY): Payer: Self-pay | Admitting: Cardiovascular Disease

## 2020-04-11 DIAGNOSIS — I25110 Atherosclerotic heart disease of native coronary artery with unstable angina pectoris: Secondary | ICD-10-CM

## 2020-04-11 NOTE — Telephone Encounter (Signed)
Referral placed for TCTS. 

## 2020-04-11 NOTE — Telephone Encounter (Signed)
Reviewed cardiac catheterization with Dr. Sherren Mocha of interventional cardiology.  There is a 90% proximal LAD lesion just prior to the aneurysmal dilatation, branching into the 1st diagonal.  Recommendation is for LIMA to LAD, bypass surgery.  He has a high risk nuclear stress test.  High risk ischemia.  Classic anginal symptoms.  We will go ahead and get him set up for referral to cardiothoracic surgery for discussion.  I have discussed the case with Philip Winters, he understands.  He knows to seek medical attention if worsening angina occurs.  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  He has experienced angina with this recently.  I have asked him to try to hold off on any further work until he sees Psychologist, sport and exercise.  Philip Furbish, MD

## 2020-04-11 NOTE — Telephone Encounter (Signed)
Patient states he had his cath yesterday had a significant block so they could not put stent in. He would like to know what is next.

## 2020-04-11 NOTE — Telephone Encounter (Signed)
Will forward to Dr Marlou Porch for review and instructions

## 2020-04-18 ENCOUNTER — Institutional Professional Consult (permissible substitution): Payer: BC Managed Care – PPO | Admitting: Cardiothoracic Surgery

## 2020-04-18 ENCOUNTER — Encounter: Payer: Self-pay | Admitting: Cardiothoracic Surgery

## 2020-04-18 ENCOUNTER — Other Ambulatory Visit: Payer: Self-pay

## 2020-04-18 ENCOUNTER — Ambulatory Visit: Payer: BC Managed Care – PPO

## 2020-04-18 ENCOUNTER — Encounter: Payer: BC Managed Care – PPO | Admitting: Acute Care

## 2020-04-18 VITALS — BP 157/83 | HR 135 | Resp 18 | Ht 68.0 in | Wt 158.0 lb

## 2020-04-18 DIAGNOSIS — I2510 Atherosclerotic heart disease of native coronary artery without angina pectoris: Secondary | ICD-10-CM

## 2020-04-18 DIAGNOSIS — I25118 Atherosclerotic heart disease of native coronary artery with other forms of angina pectoris: Secondary | ICD-10-CM | POA: Diagnosis not present

## 2020-04-18 MED ORDER — CLINDAMYCIN HCL 300 MG PO CAPS: ORAL_CAPSULE | ORAL | 2 refills | Status: DC

## 2020-04-18 NOTE — Progress Notes (Signed)
PCP is Antony Contras, MD Referring Provider is Jerline Pain, MD  Chief Complaint  Patient presents with  . Coronary Artery Disease    Initial surgical consult, cath 11/23, ECHO 11/18  . Consult    HPI: Patient examined, images of recent coronary arteriogram and echocardiogram personally reviewed and discussed with patient and wife.  59 year old hypertensive reformed smoker with history of dyslipidemia has recent symptoms of exertional angina.  He has known history of cardiac murmur from bicuspid aortic valve.  The patient recently underwent a left heart cath which demonstrated a high-grade proximal LAD stenosis prior to a diagonal branch with a coronary aneurysm.  The dominant circumflex system is open without disease.  The nondominant small and atretic RCA has diffuse disease.  Recent echocardiogram demonstrates calcified thickened bicuspid aortic valve with a calculated peak gradient of 30 mmHg.  Good LV function without significant LVH.  No significant mitral disease. A transvalvular gradient at cath measured a mean gradient of 9-10 mmHg. Past Medical History:  Diagnosis Date  . Allergy   . Arthritis   . GERD (gastroesophageal reflux disease)    occ  . Heart murmur    baby  . High cholesterol   . Hypertension   . Pneumonia   . Right carotid bruit 06/10/2017  . Sciatica   . Seasonal allergies   . Sinus tachycardia   . Spinal stenosis     Past Surgical History:  Procedure Laterality Date  . CARPAL TUNNEL WITH CUBITAL TUNNEL Left 11/10/2013   Procedure: LEFT CARPAL TUNNEL RELEASE;  Surgeon: Cammie Sickle, MD;  Location: [facility];  Service: Orthopedics;  Laterality: Left;  . COLONOSCOPY    . KNEE ARTHROSCOPY  [id number]   left and right  . LUMBAR LAMINECTOMY/DECOMPRESSION MICRODISCECTOMY Left 09/29/2017   Procedure: LEFT LUMBAR TWO - LUMBAR THREELAMINOTOMY/MICRODISCECTOMY;  Surgeon: Jovita Gamma, MD;  Location: [facility];  Service: Neurosurgery;   Laterality: Left;  LEFT LUMBAR 2- LUMBAR 3 LAMINOTOMY/MICRODISCECTOMY  . NASAL SINUS SURGERY  05/2015  . RIGHT/LEFT HEART CATH AND CORONARY ANGIOGRAPHY N/A 04/10/2020   Procedure: RIGHT/LEFT HEART CATH AND CORONARY ANGIOGRAPHY;  Surgeon: Troy Sine, MD;  Location: [facility];  Service: Cardiovascular;  Laterality: N/A;  . TOTAL KNEE ARTHROPLASTY Right 06/24/2016   Procedure: TOTAL KNEE ARTHROPLASTY;  Surgeon: Garald Balding, MD;  Location: [facility];  Service: Orthopedics;  Laterality: Right;  . TRIGGER FINGER RELEASE Left 11/10/2013   Procedure: LEFT INDEX A-1 PULLEY RELEASE;  Surgeon: Cammie Sickle, MD;  Location: [facility];  Service: Orthopedics;  Laterality: Left;  . ULNAR NERVE TRANSPOSITION Left 11/10/2013   Procedure: ULNAR NERVE TRANSPOSITION;  Surgeon: Cammie Sickle, MD;  Location: [facility];  Service: Orthopedics;  Laterality: Left;    History reviewed. No pertinent family history.  Social History Social History   Tobacco Use  . Smoking status: Former Smoker    Packs/day: 1.00    Years: 37.00    Pack years: 37.00    Types: Cigarettes    Quit date: 05/04/2016    Years since quitting: 3.9  . Smokeless tobacco: Never Used  Vaping Use  . Vaping Use: Former  Substance Use Topics  . Alcohol use: Yes    Alcohol/week: 0.0 standard drinks    Comment: occ  . Drug use: No    Current Outpatient Medications  Medication Sig Dispense Refill  . aspirin EC 81 MG tablet Take 81 mg by mouth  daily.    . diclofenac Sodium (VOLTAREN) 1 % GEL Apply 2 g topically daily as needed (Knee and back pain).    Marland Kitchen diltiazem (CARDIZEM CD) 240 MG 24 hr capsule Take 1 capsule (240 mg total) by mouth daily. 90 capsule 3  . diphenhydrAMINE (BENADRYL) 25 MG tablet Take 12 mg by mouth at bedtime.     . fluticasone (FLONASE ALLERGY RELIEF) 50 MCG/ACT nasal spray Place 2 sprays into both nostrils daily as needed for allergies.     . Multiple Vitamin  (MULTIVITAMIN WITH MINERALS) TABS tablet Take 1 tablet by mouth daily.    . naproxen sodium (ALEVE) 220 MG tablet Take 220 mg by mouth daily as needed (Back pain).    Marland Kitchen olmesartan (BENICAR) 40 MG tablet Take 40 mg by mouth daily.    Marland Kitchen tetrahydrozoline 0.05 % ophthalmic solution Place 1 drop into both eyes 3 (three) times daily as needed (for dry/irritated eyes.).    Marland Kitchen traMADol (ULTRAM) 50 MG tablet Take 25 mg by mouth 2 (two) times a week.      No current facility-administered medications for this visit.    Allergies  Allergen Reactions  . Mushroom Extract Complex Anaphylaxis  . Penicillins Shortness Of Breath    Childhood allergy.  Has patient had a PCN reaction causing immediate rash, facial/tongue/throat swelling, SOB or lightheadedness with hypotension: No Has patient had a PCN reaction causing severe rash involving mucus membranes or skin necrosis: No Has patient had a PCN reaction that required hospitalization No Has patient had a PCN reaction occurring within the last 10 years: No If all of the above answers are "NO", then may proceed with C  . Dilaudid [Hydromorphone] Nausea And Vomiting  . Bystolic [Nebivolol Hcl] Other (See Comments)    Extreme fatigue, not able to focus  . Codeine Nausea And Vomiting  . Iodine Rash  . Metoprolol Other (See Comments)    Made patient very fatigued, could not focus    Review of Systems   Patient is right-hand dominant Patient works as a Conservation officer, historic buildings Patient has 2 beers per day No history of thoracic trauma rib fracture or pneumothorax Patient had right total knee replacement 3 years ago without anesthetic problems or bleeding Patient has problems with severe anxiety and anxiety related tachycardia Patient cannot tolerate beta-blockers which cause him to be completely fatigued and nonfunctional  BP (!) 157/83 (BP Location: Left Arm, Patient Position: Sitting)   Pulse (!) 135 Comment: pt states he is very anxious  Resp 18   Ht  5\' 8"  (1.727 m)   Wt 158 lb (71.7 kg)   SpO2 95% Comment: RA with mask on  BMI 24.02 kg/m      Physical Exam  General: Normal size middle-aged well-nourished male no acute distress but anxious HEENT: Normocephalic pupils equal , dentition adequate Neck: Supple without JVD, adenopathy, or bruit.  Dental exam reveals no broken teeth but suboptimal dental hygiene Chest: Clear to auscultation, symmetrical breath sounds, no rhonchi, no tenderness             or deformity Cardiovascular: Regular rate and rhythm, 2/6 systolic murmur, no gallop, peripheral pulses             palpable in all extremities Abdomen:  Soft, nontender, no palpable mass or organomegaly Extremities: Warm, well-perfused, no clubbing cyanosis edema or tenderness,              no venous stasis changes of the legs Rectal/GU: Deferred Neuro: Grossly  non--focal and symmetrical throughout Skin: Clean and dry without rash or ulceration  Diagnostic Tests: Coronary angiogram and echocardiogram images reviewed as noted above  Impression: Significant proximal LAD stenosis involving a good sized first diagonal with a LV aneurysm Normal LV systolic function Bicuspid aortic valve with at least mild aortic stenosis Anxiety disorder History of spinal stenosis History of right knee replacement for arthritis  Plan: Would benefit from coronary bypass graft to the LAD and diagonal and probably aortic valve replacement for abnormal bicuspid aortic valve.   The patient has not seen a dentist in over 2 years because of knee replacement and subsequent problems with scheduling due to Covid  His coronary disease is stable and he has no symptoms at rest.  Patient will contact his dentist for dental hygiene and exam to be cleared for possible aortic valve replacement surgery.  After his dental visit he will return to the office and we will schedule a date for CABG with possible combined AVR.   Len Childs, MD [facility] [phone]

## 2020-04-19 ENCOUNTER — Other Ambulatory Visit: Payer: Self-pay | Admitting: Cardiothoracic Surgery

## 2020-04-19 ENCOUNTER — Telehealth: Payer: Self-pay | Admitting: Cardiology

## 2020-04-19 DIAGNOSIS — I2510 Atherosclerotic heart disease of native coronary artery without angina pectoris: Secondary | ICD-10-CM

## 2020-04-19 MED ORDER — NITROGLYCERIN 0.4 MG SL SUBL: 0.4000 mg | SUBLINGUAL_TABLET | SUBLINGUAL | 3 refills | Status: DC | PRN

## 2020-04-19 NOTE — Telephone Encounter (Signed)
Patient's wife, [name], called in to see if Dr. Marlou Porch still wanted to call in nitroglycerin - she states he mentioned calling it in when the patient saw him last so that he had something he could take if he has chest pain prior to his surgery. She also states that Kennth is having extreme anxiety about the procedure and is not sleeping and is shaky - she would like to know if there is anything we can call in to help with either of these?  Please call/advise.  Thank you!

## 2020-04-19 NOTE — Telephone Encounter (Signed)
Spoke with wife who is aware OK to have RX for SL Ntg prn.  To be sent into [facility].  Also aware to contact Dr Moreen Fowler to discuss pt's treatment for anxiety.

## 2020-04-19 NOTE — Telephone Encounter (Signed)
Will forward to Dr Skains for review and orders 

## 2020-04-19 NOTE — Telephone Encounter (Signed)
I would be fine with sending him in nitroglycerin 0.4 as needed. I am sorry that he is with increased anxiety right now.  I would encourage that he discuss this with Dr. Moreen Fowler.    Candee Furbish, MD

## 2020-04-20 ENCOUNTER — Other Ambulatory Visit: Payer: Self-pay

## 2020-04-20 ENCOUNTER — Ambulatory Visit (INDEPENDENT_AMBULATORY_CARE_PROVIDER_SITE_OTHER): Payer: BC Managed Care – PPO | Admitting: Pharmacist

## 2020-04-20 DIAGNOSIS — Z789 Other specified health status: Secondary | ICD-10-CM | POA: Insufficient documentation

## 2020-04-20 DIAGNOSIS — E782 Mixed hyperlipidemia: Secondary | ICD-10-CM | POA: Diagnosis not present

## 2020-04-20 DIAGNOSIS — G4700 Insomnia, unspecified: Secondary | ICD-10-CM | POA: Diagnosis not present

## 2020-04-20 MED ORDER — EVOLOCUMAB 140 MG/ML ~~LOC~~ SOAJ: 1.0000 mL | SUBCUTANEOUS | 1 refills | Status: DC

## 2020-04-20 NOTE — Progress Notes (Signed)
Patient ID: Philip Winters                 DOB: 07/25/1960                    MRN: [id number]     HPI: Philip Winters is a 59 y.o. male patient referred to lipid clinic by Dr Marlou Porch. PMH is significant for angina, carotid artery stenosis, HTN, statin intolerance, and HLD.  Had right and left heart cath but stent was unable to be placed due to significant blockage.  90% proximal LAD lesion.  Has been referred to cardiothoracic surgery.  Patient presents today motivated to reduce cholesterol. Very anxious about upcoming surgery. Is having trouble sleeping at night.  Is not currently working.  Has tried numerous statins in the past including rosuvastatin, atorvastatin, and pravastatin.  All led to severe muscle pain.  Has not been on lipid lowering therapy for about 5 years.  Quit smoking 5 years ago.  Still drinks alcohol during the week, about a maximum of 4 beers a day.  Reports he eats healthy, does all his cooking at home.  Typically salads and proteins.    Both parents are deceased and he remembers they had elevated cholesterol but he is not sure of cause of death.  Current Medications: n/a Intolerances: pravastatin, atorvastatin, rosuvastatin Risk Factors: family history, HTN, angina, HLD LDL goal: <70  Exercise: Was active when working.  Less so now.  Family History: Strong history of CAD, parents had high cholesterol.    Social History: Quit smoking 5 years ago.  Drinks alcohol  Labs:  TC 226, HDL 68, LDL 135, Trigs 132 (02/06/20 - not on any lipid lowering therapy)  Current 10 year ASCVD Risk: 12.7%  Past Medical History:  Diagnosis Date  . Allergy   . Arthritis   . GERD (gastroesophageal reflux disease)    occ  . Heart murmur    baby  . High cholesterol   . Hypertension   . Pneumonia   . Right carotid bruit 06/10/2017  . Sciatica   . Seasonal allergies   . Sinus tachycardia   . Spinal stenosis     Current Outpatient Medications on File Prior to Visit  Medication Sig  Dispense Refill  . aspirin EC 81 MG tablet Take 81 mg by mouth daily.    . clindamycin (CLEOCIN) 300 MG capsule Take 2 tablets 1 hour before dental procedure 2 capsule 2  . diclofenac Sodium (VOLTAREN) 1 % GEL Apply 2 g topically daily as needed (Knee and back pain).    Marland Kitchen diltiazem (CARDIZEM CD) 240 MG 24 hr capsule Take 1 capsule (240 mg total) by mouth daily. 90 capsule 3  . diphenhydrAMINE (BENADRYL) 25 MG tablet Take 12 mg by mouth at bedtime.     . fluticasone (FLONASE ALLERGY RELIEF) 50 MCG/ACT nasal spray Place 2 sprays into both nostrils daily as needed for allergies.     . Multiple Vitamin (MULTIVITAMIN WITH MINERALS) TABS tablet Take 1 tablet by mouth daily.    . naproxen sodium (ALEVE) 220 MG tablet Take 220 mg by mouth daily as needed (Back pain).    . nitroGLYCERIN (NITROSTAT) 0.4 MG SL tablet Place 1 tablet (0.4 mg total) under the tongue every 5 (five) minutes as needed for chest pain. 25 tablet 3  . olmesartan (BENICAR) 40 MG tablet Take 40 mg by mouth daily.    Marland Kitchen tetrahydrozoline 0.05 % ophthalmic solution Place 1 drop into both  eyes 3 (three) times daily as needed (for dry/irritated eyes.).    Marland Kitchen traMADol (ULTRAM) 50 MG tablet Take 25 mg by mouth 2 (two) times a week.      No current facility-administered medications on file prior to visit.    Allergies  Allergen Reactions  . Mushroom Extract Complex Anaphylaxis  . Penicillins Shortness Of Breath    Childhood allergy.  Has patient had a PCN reaction causing immediate rash, facial/tongue/throat swelling, SOB or lightheadedness with hypotension: No Has patient had a PCN reaction causing severe rash involving mucus membranes or skin necrosis: No Has patient had a PCN reaction that required hospitalization No Has patient had a PCN reaction occurring within the last 10 years: No If all of the above answers are "NO", then may proceed with C  . Dilaudid [Hydromorphone] Nausea And Vomiting  . Bystolic [Nebivolol Hcl] Other (See  Comments)    Extreme fatigue, not able to focus  . Codeine Nausea And Vomiting  . Iodine Rash  . Metoprolol Other (See Comments)    Made patient very fatigued, could not focus    Assessment/Plan:  1. Hyperlipidemia - Patient LDL 135 which is above goal of <70.  Patient needs aggressive lipid lowering due to coronary artery stenosis, evidence of coronary calcification, smoking history, and family history of CAD.  Is intolerant to statins so next step would [id number]. Patient is agreeable.  Using [employer], educated patient on storage, site selection, and administration.  Patient was able to demonstrate in room.  Printed out patient education handouts and co pay card.  Will complete PA.  Repeat lipid panel set for mid February.  Start Repatha 140mg  sq Q 14d  Karren Cobble, PharmD, BCACP, [place], [place] [address], [place], [place] [address] Phone: [phone]; Fax: [phone] 04/20/2020 10:13 AM

## 2020-04-20 NOTE — Patient Instructions (Signed)
It was good meeting you today!  You LDL (bad cholesterol) is 135.  We would like to reduce this by 50%.  We will start you on a new medication called Repatha which you will inject once every 2 weeks  I will call you when the prior authorization is approved  Please call with any questions  Karren Cobble, PharmD, BCACP, [place], [place] [address] [place], [place] [address] Phone: [phone]; Fax: [phone] 04/20/2020 8:50 AM

## 2020-04-23 ENCOUNTER — Telehealth: Payer: Self-pay | Admitting: Pharmacist

## 2020-04-23 NOTE — Telephone Encounter (Signed)
PA for Repatha approved approved until 04/19/20.  Called patient, patient aware.

## 2020-04-24 ENCOUNTER — Telehealth: Payer: Self-pay

## 2020-04-24 ENCOUNTER — Other Ambulatory Visit: Payer: Self-pay | Admitting: Acute Care

## 2020-04-24 ENCOUNTER — Other Ambulatory Visit: Payer: Self-pay

## 2020-04-24 DIAGNOSIS — Z91041 Radiographic dye allergy status: Secondary | ICD-10-CM

## 2020-04-24 MED ORDER — PREDNISONE 50 MG PO TABS: ORAL_TABLET | ORAL | 0 refills | Status: DC

## 2020-04-24 NOTE — Telephone Encounter (Signed)
RX for Prednisone 50 mg po 13 hrs, 7 hrs and 1 hr prior to IV dye injection/CT scan And Benadtry 50 mg tablet po 1 hour prior to IV dye injection/CT scan

## 2020-04-24 NOTE — Telephone Encounter (Signed)
Patient contacted the office to stated that he did have his teeth cleaned this week by Malcolm Metro, DDS.  He stated that he is aware of his follow-up appointment with Dr. Prescott Gum this week and he was made aware the he needed to get a CT scan done before his appointment with Walker.

## 2020-04-25 ENCOUNTER — Ambulatory Visit
Admission: RE | Admit: 2020-04-25 | Discharge: 2020-04-25 | Disposition: A | Discharge status: Home / Self Care | Payer: BC Managed Care – PPO | Source: Ambulatory Visit | Attending: Cardiothoracic Surgery | Admitting: Cardiothoracic Surgery

## 2020-04-25 DIAGNOSIS — I2510 Atherosclerotic heart disease of native coronary artery without angina pectoris: Secondary | ICD-10-CM | POA: Diagnosis not present

## 2020-04-25 DIAGNOSIS — I712 Thoracic aortic aneurysm, without rupture: Secondary | ICD-10-CM | POA: Diagnosis not present

## 2020-04-25 DIAGNOSIS — Q231 Congenital insufficiency of aortic valve: Secondary | ICD-10-CM | POA: Diagnosis not present

## 2020-04-25 DIAGNOSIS — J439 Emphysema, unspecified: Secondary | ICD-10-CM | POA: Diagnosis not present

## 2020-04-25 IMAGING — CT CT CHEST W/ CM
2 of 4 series · 14 of 36 positions shown, 17 images · IV contrast (iopamidol)
Comparison: None

CLINICAL DATA: Aortic disease, history of angina and reported
bicuspid aortic valve

EXAM:
CT CHEST WITH CONTRAST
TECHNIQUE: Multidetector CT imaging of the chest was performed during
intravenous contrast administration.
CONTRAST:  75mL [ER] IOPAMIDOL ([ER]) INJECTION 61%

[Series 2: chest 2.00 br40 s3 · axial · 0.70mm/px · z∈[+1520,+1798]mm · 11 of 165 slices shown, 14 images (1 of 2)]
[im 13/165  mediastinal]
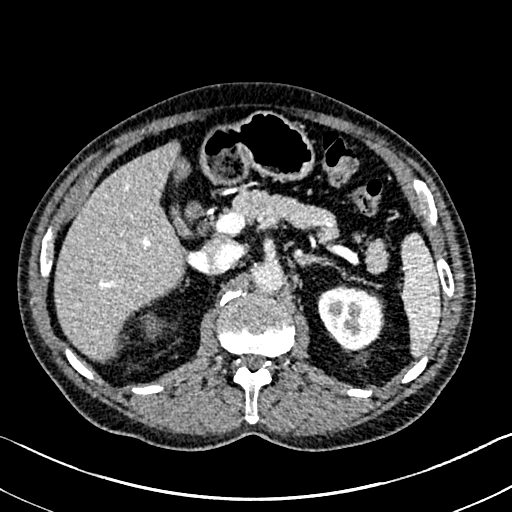
[im 13/165  lung]
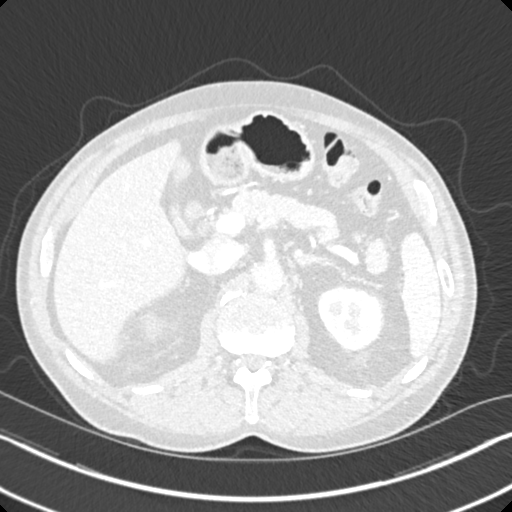
[im 26/165  lung]
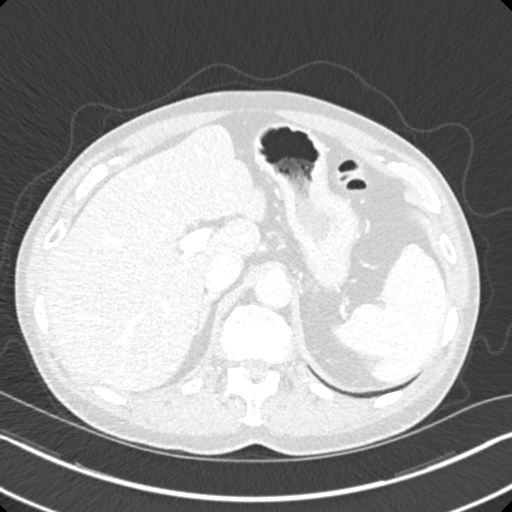
[im 38/165  lung]
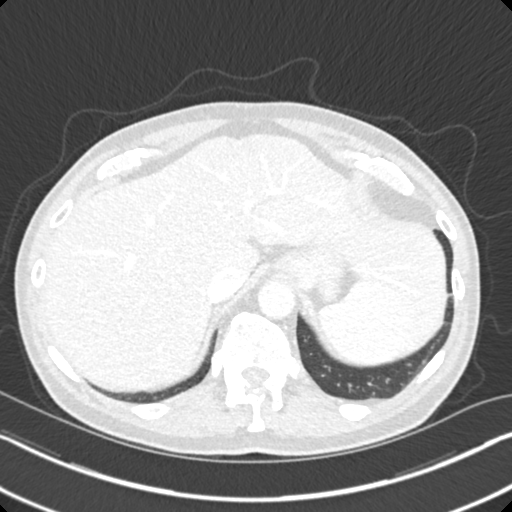
[im 51/165  lung]
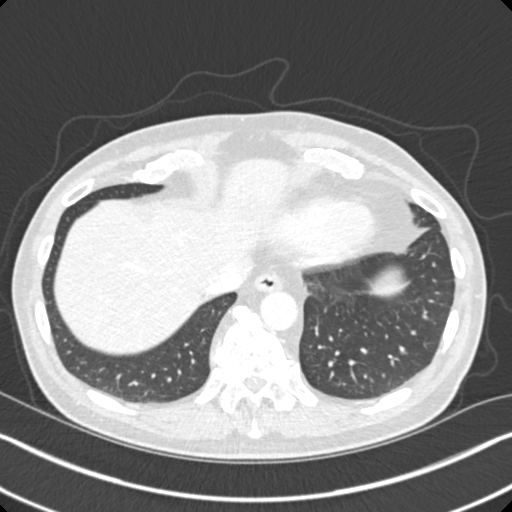
[im 64/165  mediastinal]
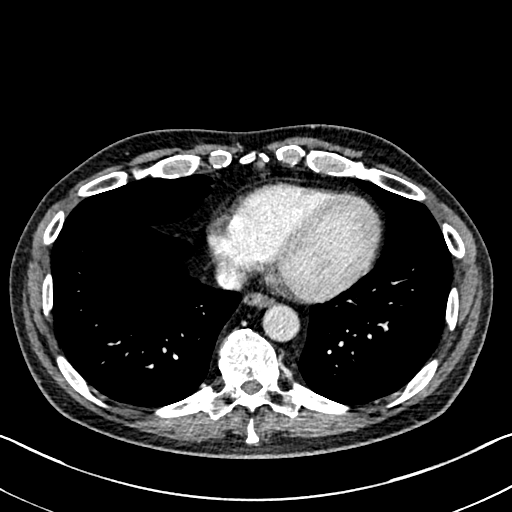
[im 64/165  lung]
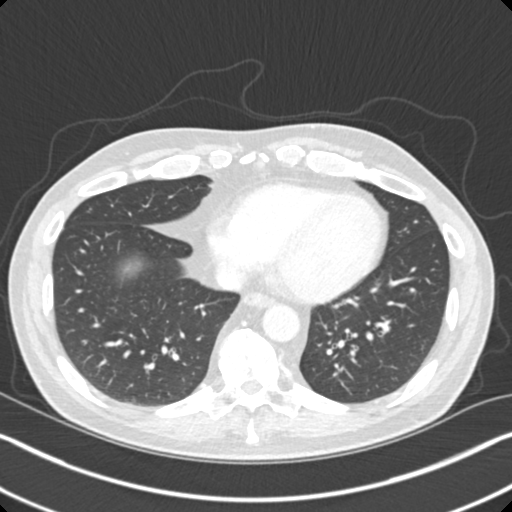
[im 89/165  lung]
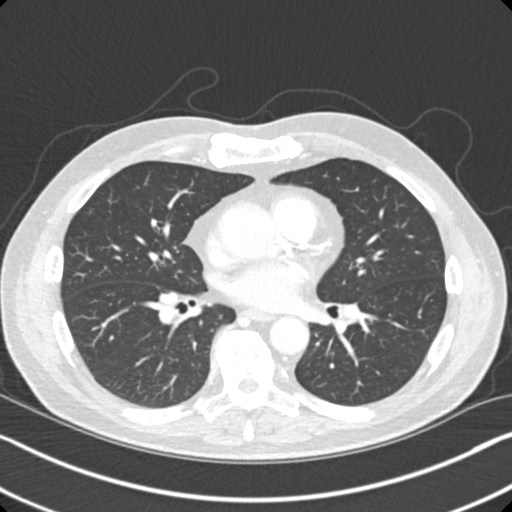
[im 101/165  lung]
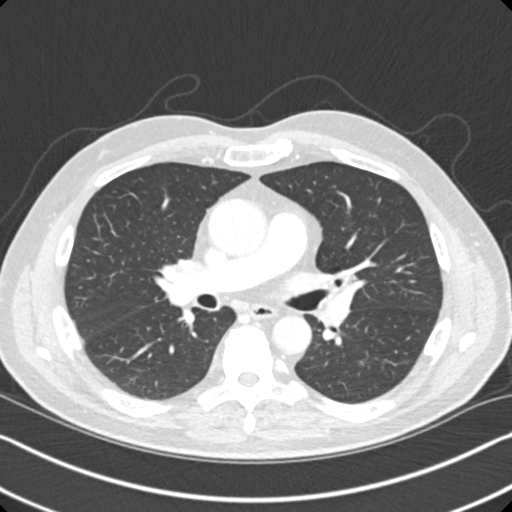
[im 114/165  lung]
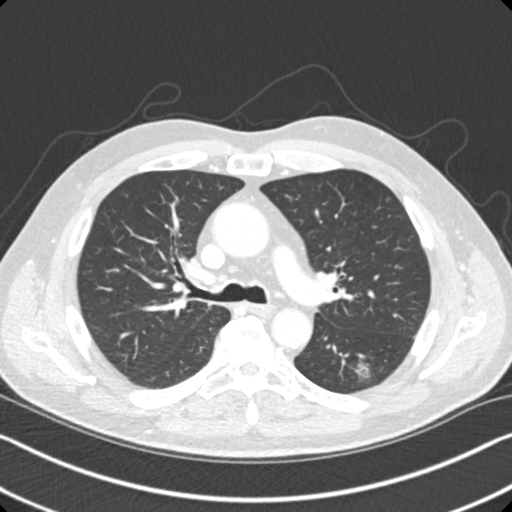
[im 127/165  mediastinal]
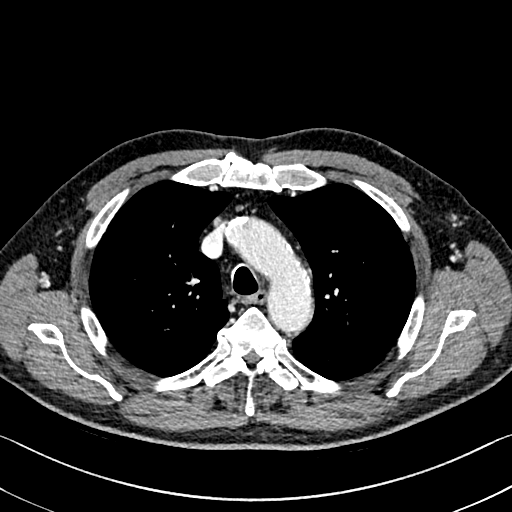
[im 127/165  lung]
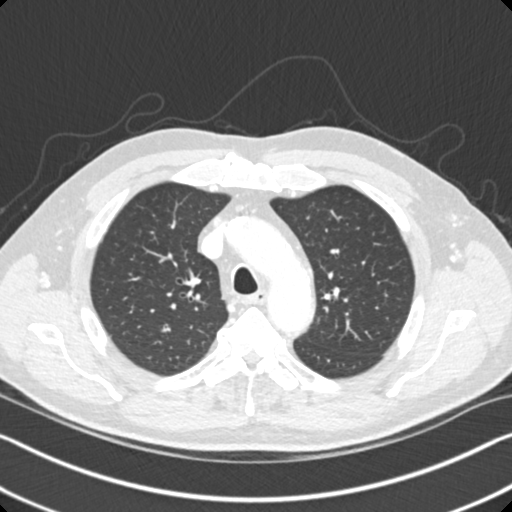
[im 139/165  lung]
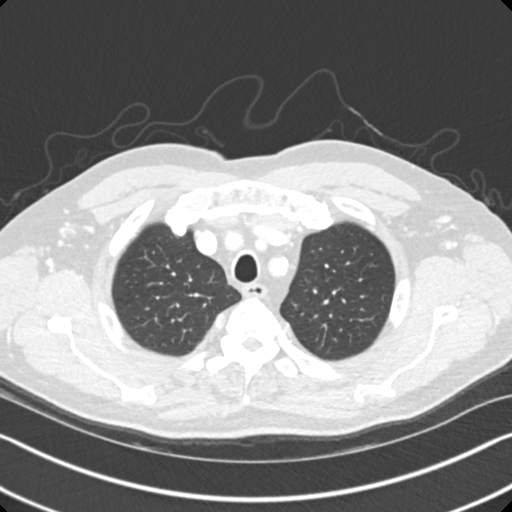
[im 152/165  lung]
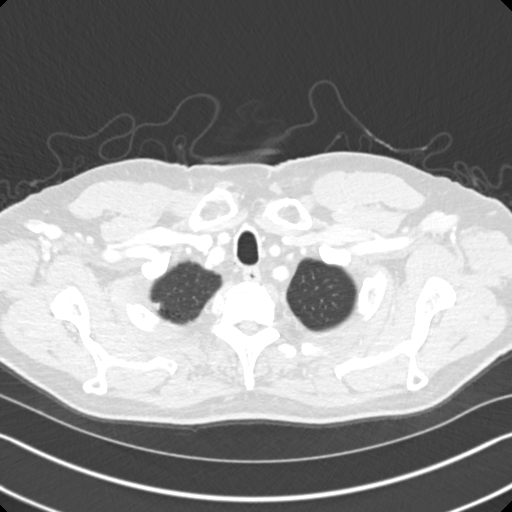

[Series 4: chest 2.00 br40 s3 · coronal · 0.65mm/px · 3 of 178 slices shown (2 of 2)]
[im 36/178  lung]
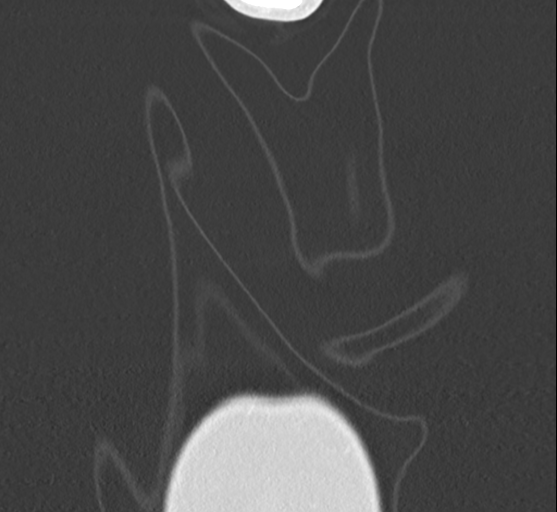
[im 71/178  lung]
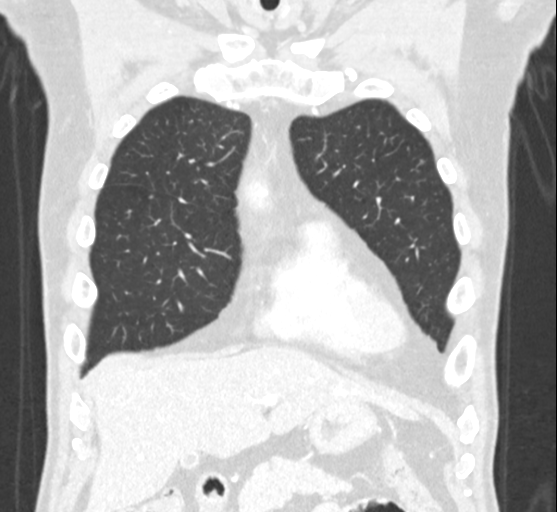
[im 107/178  lung]
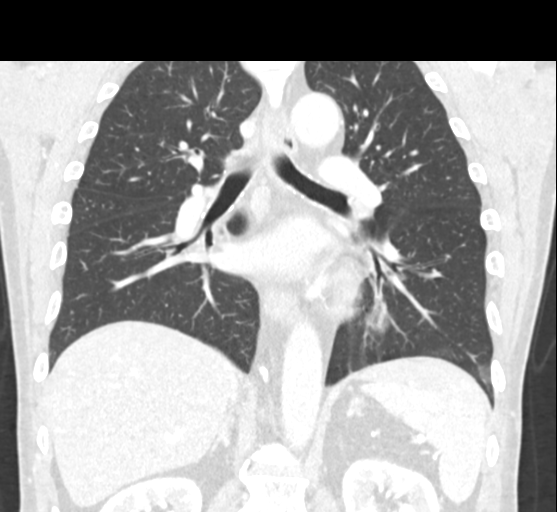

[14 of 36 positions shown; findings below may reference images not displayed]

FINDINGS: Cardiovascular: Calcified and noncalcified plaque of the thoracic
aorta. Ascending thoracic aorta with dilation to 4.1 cm. Normal
caliber of the descending thoracic aorta. Calcification about the
aortic valve. Three-vessel coronary artery disease. No pericardial
effusion.

Central pulmonary vasculature grossly normal, limited assessment
based on phase of enhancement.

Mediastinum/Nodes: Esophagus unremarkable on CT. No thoracic inlet
adenopathy. No axillary lymphadenopathy. No mediastinal
lymphadenopathy.

Lungs/Pleura: Paraseptal emphysema, mild and worse at the lung
apices. Small pulmonary nodule in the RIGHT upper lobe along the
minor fissure (image 60, series 8) 4 mm.

Part solid nodule measuring 15 x 11 mm in the superior segment of
the LEFT lower lobe (image 52 and 51 of series 8) predominantly
ground-glass with 3 mm area of more confluent soft tissue at the
cephalad margin.

Ground-glass nodule LEFT lower lobe (image 67, series [DATE] x 6 mm

Mild scarring in the lingula.  Lungs are otherwise clear.

Upper Abdomen: Lobular hepatic contours. Hepatic steatosis. No focal
hepatic lesion. The portal vein is patent. Gallbladder is collapsed.
No biliary duct dilation. Imaged portions the pancreas and spleen
are unremarkable. No upper abdominal adenopathy.

Musculoskeletal: Spinal degenerative changes. No acute or
destructive bone finding.
IMPRESSION: 1. Ascending thoracic aorta with dilation to 4.1 cm. Recommend
annual imaging followup by CTA or MRA. This recommendation follows
[ER] ACCF/AHA/AATS/ACR/ASA/SCA/ERLINDA/ERLINDA/ERLINDA/ERLINDA Guidelines for the
Diagnosis and Management of Patients with Thoracic Aortic Disease.
Circulation. [ER]; 121: [id number]. Aortic aneurysm NOS ([ER]-[ER])
2. Three-vessel coronary artery disease.
3. Sub solid nodule measuring 15 x 11 mm in the superior segment of
the LEFT lower lobe. Ground-glass nodule LEFT lower lobe 8 x 6 mm.
Area in superior segment of the LEFT upper lobe is the most
suspicious. Morphologic characteristics do raise the question of
bronchogenic neoplasm. Non-contrast chest CT at 3 months is
recommended. If nodules persist and are stable at that time,
consider additional non-contrast chest CT examinations at 2 and 4
years. This recommendation follows the consensus statement:
Guidelines for Management of Incidental Pulmonary Nodules Detected
[DATE]. Comparison with prior imaging could also be helpful if
available. No priors available in the system for this patient
showing this area.
4. Small nodule in the RIGHT chest, this could be followed with
other nodules described above.
5. Paraseptal emphysema, mild and worse at the lung apices.
6. Lobular hepatic contours, correlate with any clinical or
laboratory evidence of liver disease.
7. Emphysema and aortic atherosclerosis.

Aortic Atherosclerosis ([ER]-[ER]) and Emphysema ([ER]-[ER]).

## 2020-04-25 MED ORDER — IOPAMIDOL (ISOVUE-300) INJECTION 61%
75.0000 mL | Freq: Once | INTRAVENOUS | Status: AC | PRN
  Administered 2020-04-25: 75 mL via INTRAVENOUS

## 2020-04-26 ENCOUNTER — Other Ambulatory Visit: Payer: Self-pay

## 2020-04-26 ENCOUNTER — Encounter: Payer: Self-pay | Admitting: Cardiothoracic Surgery

## 2020-04-26 ENCOUNTER — Other Ambulatory Visit: Payer: Self-pay | Admitting: *Deleted

## 2020-04-26 ENCOUNTER — Ambulatory Visit (INDEPENDENT_AMBULATORY_CARE_PROVIDER_SITE_OTHER): Payer: BC Managed Care – PPO | Admitting: Cardiothoracic Surgery

## 2020-04-26 VITALS — BP 130/80 | HR 105 | Temp 97.6°F | Resp 20 | Ht 68.0 in | Wt 158.0 lb

## 2020-04-26 DIAGNOSIS — Q231 Congenital insufficiency of aortic valve: Secondary | ICD-10-CM

## 2020-04-26 DIAGNOSIS — I2510 Atherosclerotic heart disease of native coronary artery without angina pectoris: Secondary | ICD-10-CM | POA: Diagnosis not present

## 2020-04-26 DIAGNOSIS — R911 Solitary pulmonary nodule: Secondary | ICD-10-CM | POA: Insufficient documentation

## 2020-04-26 MED ORDER — ALPRAZOLAM 0.5 MG PO TABS: 0.5000 mg | ORAL_TABLET | Freq: Two times a day (BID) | ORAL | 0 refills | Status: DC | PRN

## 2020-04-26 NOTE — Progress Notes (Signed)
PCP is Antony Contras, MD Referring Provider is Jerline Pain, MD  Chief Complaint  Patient presents with  . Coronary Artery Disease    Further discuss scheduling surgery    HPI: The patient returns for further discussion of his recently diagnosed two-vessel coronary artery disease and mild-moderate stenosis of a bicuspid aortic valve.  He has had no more angina and has not needed nitroglycerin.  His primary physician called him in some Ativan for his anxiety.  He was seen by his family dentist and had dental hygiene and was cleared for aortic valve replacement if needed.  The patient is also had a CTA of the chest due to his bicuspid valve to assess his ascending aorta which is minimally dilated at 4.1 cm with normal morphology.  He does have a 1.5 cm infiltrative density in the superior segment of the left lower lobe which will need to be followed with further scanning.  He stopped smoking over 5 years ago.   The patient has no current issues with cough bronchitis or other complaints.  We will schedule his surgery for Tuesday, December 14 at [facility].  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  The patient understands the left lung density is very posterior in the left lower lobe and will probably not be accessible or notable at the time of surgery.   Past Medical History:  Diagnosis Date  . Allergy   . Arthritis   . GERD (gastroesophageal reflux disease)    occ  . Heart murmur    baby  . High cholesterol   . Hypertension   . Pneumonia   . Right carotid bruit 06/10/2017  . Sciatica   . Seasonal allergies   . Sinus tachycardia   . Spinal stenosis     Past Surgical History:  Procedure Laterality Date  . CARPAL TUNNEL WITH CUBITAL TUNNEL Left 11/10/2013   Procedure: LEFT CARPAL TUNNEL RELEASE;  Surgeon:  Cammie Sickle, MD;  Location: [place];  Service: Orthopedics;  Laterality: Left;  . COLONOSCOPY    . KNEE ARTHROSCOPY  [id number]   left and right  . LUMBAR LAMINECTOMY/DECOMPRESSION MICRODISCECTOMY Left 09/29/2017   Procedure: LEFT LUMBAR TWO - LUMBAR THREELAMINOTOMY/MICRODISCECTOMY;  Surgeon: Jovita Gamma, MD;  Location: [place];  Service: Neurosurgery;  Laterality: Left;  LEFT LUMBAR 2- LUMBAR 3 LAMINOTOMY/MICRODISCECTOMY  . NASAL SINUS SURGERY  05/2015  . RIGHT/LEFT HEART CATH AND CORONARY ANGIOGRAPHY N/A 04/10/2020   Procedure: RIGHT/LEFT HEART CATH AND CORONARY ANGIOGRAPHY;  Surgeon: Troy Sine, MD;  Location: [place] [facility];  Service: Cardiovascular;  Laterality: N/A;  . TOTAL KNEE ARTHROPLASTY Right 06/24/2016   Procedure: TOTAL KNEE ARTHROPLASTY;  Surgeon: Garald Balding, MD;  Location: [place];  Service: Orthopedics;  Laterality: Right;  . TRIGGER FINGER RELEASE Left 11/10/2013   Procedure: LEFT INDEX A-1 PULLEY RELEASE;  Surgeon: Cammie Sickle, MD;  Location: [place];  Service: Orthopedics;  Laterality: Left;  . ULNAR NERVE TRANSPOSITION Left 11/10/2013   Procedure: ULNAR NERVE TRANSPOSITION;  Surgeon: Cammie Sickle, MD;  Location: [place];  Service: Orthopedics;  Laterality: Left;    History reviewed. No pertinent family history.  Social History Social History   Tobacco Use  . Smoking status: Former Smoker    Packs/day: 1.00  Years: 37.00    Pack years: 37.00    Types: Cigarettes    Quit date: 05/04/2016    Years since quitting: 3.9  . Smokeless tobacco: Never Used  Vaping Use  . Vaping Use: Former  Substance Use Topics  . Alcohol use: Yes    Alcohol/week: 0.0 standard drinks    Comment: occ  . Drug use: No    Current Outpatient Medications  Medication Sig Dispense Refill  . aspirin EC 81 MG tablet Take 81 mg by mouth daily.    . clindamycin (CLEOCIN) 300 MG capsule Take 2 tablets 1  hour before dental procedure 2 capsule 2  . diclofenac Sodium (VOLTAREN) 1 % GEL Apply 2 g topically daily as needed (Knee and back pain).    Marland Kitchen diltiazem (CARDIZEM CD) 240 MG 24 hr capsule Take 1 capsule (240 mg total) by mouth daily. 90 capsule 3  . diphenhydrAMINE (BENADRYL) 25 MG tablet Take 12 mg by mouth at bedtime.    . Evolocumab 140 MG/ML SOAJ Inject 1 mL into the skin every 14 (fourteen) days. 6 mL 1  . fluticasone (FLONASE) 50 MCG/ACT nasal spray Place 2 sprays into both nostrils daily as needed for allergies.     . Multiple Vitamin (MULTIVITAMIN WITH MINERALS) TABS tablet Take 1 tablet by mouth daily.    . naproxen sodium (ALEVE) 220 MG tablet Take 220 mg by mouth daily as needed (Back pain).    . nitroGLYCERIN (NITROSTAT) 0.4 MG SL tablet Place 1 tablet (0.4 mg total) under the tongue every 5 (five) minutes as needed for chest pain. 25 tablet 3  . olmesartan (BENICAR) 40 MG tablet Take 40 mg by mouth daily.    . predniSONE (DELTASONE) 50 MG tablet Take  Prednisone 50 mg tablet 13 hours, 7 hours and 1 hour beforem IV dye infection Benadryl  50 mg tablet 1 hour before the IV dye injection 3 tablet 0  . tetrahydrozoline 0.05 % ophthalmic solution Place 1 drop into both eyes 3 (three) times daily as needed (for dry/irritated eyes.).    Marland Kitchen traMADol (ULTRAM) 50 MG tablet Take 25 mg by mouth 2 (two) times a week.     . ALPRAZolam (XANAX) 0.5 MG tablet Take 1 tablet (0.5 mg total) by mouth 2 (two) times daily as needed for up to 5 days for anxiety. 10 tablet 0  . LORazepam (ATIVAN) 0.5 MG tablet     . predniSONE (DELTASONE) 50 MG tablet Prednisone 50 mg tablet: take 1 tablet 13 hours , 7 hours and 1 hour prior to CT scan And take Benadryl 50 mg tablet 1 hour prior to CT scan (Patient not taking: Reported on 04/26/2020) 3 tablet 0   No current facility-administered medications for this visit.    Allergies  Allergen Reactions  . Mushroom Extract Complex Anaphylaxis  . Penicillins Shortness  Of Breath    Childhood allergy.  Has patient had a PCN reaction causing immediate rash, facial/tongue/throat swelling, SOB or lightheadedness with hypotension: No Has patient had a PCN reaction causing severe rash involving mucus membranes or skin necrosis: No Has patient had a PCN reaction that required hospitalization No Has patient had a PCN reaction occurring within the last 10 years: No If all of the above answers are "NO", then may proceed with C  . Atorvastatin     myalgia  . Chantix [Varenicline]     Other reaction(s): headaches  . Dilaudid [Hydromorphone] Nausea And Vomiting  . Pravastatin  myalgia  . Rosuvastatin     myalgia  . Bystolic [Nebivolol Hcl] Other (See Comments)    Extreme fatigue, not able to focus  . Codeine Nausea And Vomiting  . Iodine Rash  . Metoprolol Other (See Comments)    Made patient very fatigued, could not focus    Review of Systems He is allergic to iodine He is allergic to penicillin He has had recent dental hygiene and has no dental complaints BP 130/80   Pulse (!) 105   Temp 97.6 F (36.4 C) (Skin)   Resp 20   Ht 5\' 8"  (1.727 m)   Wt 158 lb (71.7 kg)   SpO2 97% Comment: RA with mask on  BMI 24.02 kg/m  Physical Exam        Exam    General- alert and comfortable    Neck- no JVD, no cervical adenopathy palpable, no carotid bruit   Lungs- clear without rales, wheezes   Cor- regular rate and rhythm, 7-0/4 systolic murmur without gallop   Abdomen- soft, non-tender   Extremities - warm, non-tender, minimal edema   Neuro- oriented, appropriate, no focal weakness    Diagnostic Tests: CTA images personally reviewed showing the ascending aorta with 4.1 cm diameter otherwise normal appearing and the lung nodule in the superior segment of the left lower lobe, semisolid and will be in need of follow-up scanning.  Impression: Two-vessel CAD Mild-moderate stenosis of a bicuspid aortic valve\ History of smoking with 1.5 cm left lower  lobe pulmonary nodule Anxiety  Plan: Plan bypass surgery with combined AVR as discussed above.  I discussed the major aspects of the operation and the potential risks of stroke, bleeding, arrhythmia, pulmonary problems including pleural effusion, infection, organ failure, and death.  He agrees to proceed with surgery on December 14 at [facility], MD [facility] [phone]

## 2020-04-27 ENCOUNTER — Ambulatory Visit: Payer: BC Managed Care – PPO | Admitting: Cardiology

## 2020-04-27 NOTE — Progress Notes (Signed)
Your procedure is scheduled on Tuesday, December 14th.  Report to [facility] Main Entrance "A" at 6:00 A.M., and check in at the Admitting office.  Call this number if you have problems the morning of surgery:  [phone]  Call [phone] if you have any questions prior to your surgery date Monday-Friday 8am-4pm   Remember:  Do not eat or drink after midnight the night before your surgery    Take these medicines the morning of surgery with A SIP OF WATER  diltiazem (CARDIZEM CD)  traMADol (ULTRAM)   If needed: fluticasone (FLONASE)/nasal spray, LORazepam (ATIVAN), nitroGLYCERIN (NITROSTAT), Eye drops    Follow your surgeon's instructions on when to stop Aspirin.  If no instructions were given by your surgeon then you will need to call the office to get those instructions.    As of today, STOP taking diclofenac Sodium (VOLTAREN),  Aleve, Naproxen, Ibuprofen, Motrin, Advil, Goody's, BC's, all herbal medications, fish oil, and all vitamins.                     Do not wear jewelry.            Do not wear lotions, powders, colognes, or deodorant.            Men may shave face and neck.            Do not bring valuables to the hospital.            [facility] is not responsible for any belongings or valuables.  Do NOT Smoke (Tobacco/Vaping) or drink Alcohol 24 hours prior to your procedure If you use a CPAP at night, you may bring all equipment for your overnight stay.   Contacts, glasses, dentures or bridgework may not be worn into surgery.      For patients admitted to the hospital, discharge time will be determined by your treatment team.   Patients discharged the day of surgery will not be allowed to drive home, and someone needs to stay with them for 24 hours.  Special instructions:   [place]- Preparing For Surgery  Before surgery, you can play an important role. Because skin is not sterile, your skin needs to be as free of germs as possible. You can reduce the number of  germs on your skin by washing with CHG (chlorahexidine gluconate) Soap before surgery.  CHG is an antiseptic cleaner which kills germs and bonds with the skin to continue killing germs even after washing.    Oral Hygiene is also important to reduce your risk of infection.  Remember - BRUSH YOUR TEETH THE MORNING OF SURGERY WITH YOUR REGULAR TOOTHPASTE  Please do not use if you have an allergy to CHG or antibacterial soaps. If your skin becomes reddened/irritated stop using the CHG.  Do not shave (including legs and underarms) for at least 48 hours prior to first CHG shower. It is OK to shave your face.  Please follow these instructions carefully.   1. Shower the NIGHT BEFORE SURGERY and the MORNING OF SURGERY with CHG Soap.   2. If you chose to wash your hair, wash your hair first as usual with your normal shampoo.  3. After you shampoo, rinse your hair and body thoroughly to remove the shampoo.  4. Use CHG as you would any other liquid soap. You can apply CHG directly to the skin and wash gently with a scrungie or a clean washcloth.   5. Apply the CHG Soap to  your body ONLY FROM THE NECK DOWN.  Do not use on open wounds or open sores. Avoid contact with your eyes, ears, mouth and genitals (private parts). Wash Face and genitals (private parts)  with your normal soap.   6. Wash thoroughly, paying special attention to the area where your surgery will be performed.  7. Thoroughly rinse your body with warm water from the neck down.  8. DO NOT shower/wash with your normal soap after using and rinsing off the CHG Soap.  9. Pat yourself dry with a CLEAN TOWEL.  10. Wear CLEAN PAJAMAS to bed the night before surgery  11. Place CLEAN SHEETS on your bed the night of your first shower and DO NOT SLEEP WITH PETS.  Day of Surgery: Wear Clean/Comfortable clothing the morning of surgery Do not apply any deodorants/lotions.   Remember to brush your teeth WITH YOUR REGULAR TOOTHPASTE.   Please  read over the following fact sheets that you were given.

## 2020-04-30 ENCOUNTER — Other Ambulatory Visit: Payer: Self-pay

## 2020-04-30 ENCOUNTER — Ambulatory Visit (HOSPITAL_COMMUNITY)
Admission: RE | Admit: 2020-04-30 | Discharge: 2020-04-30 | Disposition: A | Discharge status: Home / Self Care | Payer: BC Managed Care – PPO | Source: Ambulatory Visit | Attending: Cardiothoracic Surgery | Admitting: Cardiothoracic Surgery

## 2020-04-30 ENCOUNTER — Ambulatory Visit (HOSPITAL_BASED_OUTPATIENT_CLINIC_OR_DEPARTMENT_OTHER)
Admission: RE | Admit: 2020-04-30 | Discharge: 2020-04-30 | Disposition: A | Discharge status: Home / Self Care | Payer: BC Managed Care – PPO | Source: Ambulatory Visit | Attending: Cardiothoracic Surgery | Admitting: Cardiothoracic Surgery

## 2020-04-30 ENCOUNTER — Encounter (HOSPITAL_COMMUNITY)
Admission: RE | Admit: 2020-04-30 | Discharge: 2020-04-30 | Disposition: A | Discharge status: Home / Self Care | Payer: BC Managed Care – PPO | Source: Ambulatory Visit | Attending: Cardiothoracic Surgery | Admitting: Cardiothoracic Surgery

## 2020-04-30 ENCOUNTER — Encounter (HOSPITAL_COMMUNITY): Payer: Self-pay

## 2020-04-30 ENCOUNTER — Other Ambulatory Visit (HOSPITAL_COMMUNITY)
Admission: RE | Admit: 2020-04-30 | Discharge: 2020-04-30 | Disposition: A | Discharge status: Home / Self Care | Payer: BC Managed Care – PPO | Source: Ambulatory Visit | Attending: Cardiothoracic Surgery | Admitting: Cardiothoracic Surgery

## 2020-04-30 DIAGNOSIS — D62 Acute posthemorrhagic anemia: Secondary | ICD-10-CM | POA: Diagnosis not present

## 2020-04-30 DIAGNOSIS — Z01818 Encounter for other preprocedural examination: Secondary | ICD-10-CM

## 2020-04-30 DIAGNOSIS — M1990 Unspecified osteoarthritis, unspecified site: Secondary | ICD-10-CM | POA: Diagnosis not present

## 2020-04-30 DIAGNOSIS — E8770 Fluid overload, unspecified: Secondary | ICD-10-CM | POA: Diagnosis not present

## 2020-04-30 DIAGNOSIS — J9811 Atelectasis: Secondary | ICD-10-CM | POA: Diagnosis not present

## 2020-04-30 DIAGNOSIS — Z91018 Allergy to other foods: Secondary | ICD-10-CM | POA: Diagnosis not present

## 2020-04-30 DIAGNOSIS — Q231 Congenital insufficiency of aortic valve: Secondary | ICD-10-CM | POA: Insufficient documentation

## 2020-04-30 DIAGNOSIS — I2510 Atherosclerotic heart disease of native coronary artery without angina pectoris: Secondary | ICD-10-CM | POA: Diagnosis not present

## 2020-04-30 DIAGNOSIS — Z01812 Encounter for preprocedural laboratory examination: Secondary | ICD-10-CM | POA: Insufficient documentation

## 2020-04-30 DIAGNOSIS — I2541 Coronary artery aneurysm: Secondary | ICD-10-CM | POA: Diagnosis not present

## 2020-04-30 DIAGNOSIS — J90 Pleural effusion, not elsewhere classified: Secondary | ICD-10-CM | POA: Diagnosis not present

## 2020-04-30 DIAGNOSIS — Z885 Allergy status to narcotic agent status: Secondary | ICD-10-CM | POA: Diagnosis not present

## 2020-04-30 DIAGNOSIS — Z87891 Personal history of nicotine dependence: Secondary | ICD-10-CM | POA: Diagnosis not present

## 2020-04-30 DIAGNOSIS — M5430 Sciatica, unspecified side: Secondary | ICD-10-CM | POA: Diagnosis present

## 2020-04-30 DIAGNOSIS — D696 Thrombocytopenia, unspecified: Secondary | ICD-10-CM | POA: Diagnosis not present

## 2020-04-30 DIAGNOSIS — I10 Essential (primary) hypertension: Secondary | ICD-10-CM | POA: Diagnosis not present

## 2020-04-30 DIAGNOSIS — E785 Hyperlipidemia, unspecified: Secondary | ICD-10-CM | POA: Diagnosis not present

## 2020-04-30 DIAGNOSIS — R000 Tachycardia, unspecified: Secondary | ICD-10-CM | POA: Diagnosis not present

## 2020-04-30 DIAGNOSIS — I25110 Atherosclerotic heart disease of native coronary artery with unstable angina pectoris: Secondary | ICD-10-CM | POA: Diagnosis not present

## 2020-04-30 DIAGNOSIS — I25118 Atherosclerotic heart disease of native coronary artery with other forms of angina pectoris: Secondary | ICD-10-CM | POA: Diagnosis not present

## 2020-04-30 DIAGNOSIS — I082 Rheumatic disorders of both aortic and tricuspid valves: Secondary | ICD-10-CM | POA: Diagnosis not present

## 2020-04-30 DIAGNOSIS — R059 Cough, unspecified: Secondary | ICD-10-CM | POA: Diagnosis not present

## 2020-04-30 DIAGNOSIS — M4800 Spinal stenosis, site unspecified: Secondary | ICD-10-CM | POA: Diagnosis not present

## 2020-04-30 DIAGNOSIS — Z96651 Presence of right artificial knee joint: Secondary | ICD-10-CM | POA: Diagnosis present

## 2020-04-30 DIAGNOSIS — R911 Solitary pulmonary nodule: Secondary | ICD-10-CM | POA: Diagnosis not present

## 2020-04-30 DIAGNOSIS — Z8701 Personal history of pneumonia (recurrent): Secondary | ICD-10-CM | POA: Diagnosis not present

## 2020-04-30 DIAGNOSIS — Z880 Allergy status to penicillin: Secondary | ICD-10-CM | POA: Diagnosis not present

## 2020-04-30 DIAGNOSIS — E7800 Pure hypercholesterolemia, unspecified: Secondary | ICD-10-CM | POA: Diagnosis not present

## 2020-04-30 DIAGNOSIS — Z20822 Contact with and (suspected) exposure to covid-19: Secondary | ICD-10-CM | POA: Diagnosis not present

## 2020-04-30 DIAGNOSIS — I350 Nonrheumatic aortic (valve) stenosis: Secondary | ICD-10-CM | POA: Diagnosis not present

## 2020-04-30 DIAGNOSIS — K219 Gastro-esophageal reflux disease without esophagitis: Secondary | ICD-10-CM | POA: Diagnosis not present

## 2020-04-30 DIAGNOSIS — Z888 Allergy status to other drugs, medicaments and biological substances status: Secondary | ICD-10-CM | POA: Diagnosis not present

## 2020-04-30 DIAGNOSIS — I517 Cardiomegaly: Secondary | ICD-10-CM | POA: Diagnosis not present

## 2020-04-30 LAB — BLOOD GAS, ARTERIAL
Acid-base deficit: 1.4 mmol/L (ref 0.0–2.0)
Bicarbonate: 22.1 mmol/L (ref 20.0–28.0)
Drawn by: 602861
FIO2: 21
O2 Saturation: 96.6 %
Patient temperature: 37
pCO2 arterial: 33.2 mmHg (ref 32.0–48.0)
pH, Arterial: 7.439 (ref 7.350–7.450)
pO2, Arterial: 82.5 mmHg — ABNORMAL LOW (ref 83.0–108.0)

## 2020-04-30 LAB — CBC
HCT: 41 % (ref 39.0–52.0)
Hemoglobin: 14.7 g/dL (ref 13.0–17.0)
MCH: 34.6 pg — ABNORMAL HIGH (ref 26.0–34.0)
MCHC: 35.9 g/dL (ref 30.0–36.0)
MCV: 96.5 fL (ref 80.0–100.0)
Platelets: 289 10*3/uL (ref 150–400)
RBC: 4.25 MIL/uL (ref 4.22–5.81)
RDW: 12.3 % (ref 11.5–15.5)
WBC: 6.5 10*3/uL (ref 4.0–10.5)
nRBC: 0 % (ref 0.0–0.2)

## 2020-04-30 LAB — COMPREHENSIVE METABOLIC PANEL
ALT: 23 U/L (ref 0–44)
AST: 27 U/L (ref 15–41)
Albumin: 3.7 g/dL (ref 3.5–5.0)
Alkaline Phosphatase: 131 U/L — ABNORMAL HIGH (ref 38–126)
Anion gap: 12 (ref 5–15)
BUN: 5 mg/dL — ABNORMAL LOW (ref 6–20)
CO2: 20 mmol/L — ABNORMAL LOW (ref 22–32)
Calcium: 9.3 mg/dL (ref 8.9–10.3)
Chloride: 102 mmol/L (ref 98–111)
Creatinine, Ser: 0.65 mg/dL (ref 0.61–1.24)
GFR, Estimated: >60 mL/min (ref >60)
Glucose, Bld: 124 mg/dL — ABNORMAL HIGH (ref 70–99)
Potassium: 4.3 mmol/L (ref 3.5–5.1)
Sodium: 134 mmol/L — ABNORMAL LOW (ref 135–145)
Total Bilirubin: 0.8 mg/dL (ref 0.3–1.2)
Total Protein: 7 g/dL (ref 6.5–8.1)

## 2020-04-30 LAB — SURGICAL PCR SCREEN
MRSA, PCR: NEGATIVE
Staphylococcus aureus: NEGATIVE

## 2020-04-30 LAB — URINALYSIS, ROUTINE W REFLEX MICROSCOPIC
Bilirubin Urine: NEGATIVE
Glucose, UA: NEGATIVE mg/dL
Hgb urine dipstick: NEGATIVE
Ketones, ur: NEGATIVE mg/dL
Leukocytes,Ua: NEGATIVE
Nitrite: NEGATIVE
Protein, ur: NEGATIVE mg/dL
Specific Gravity, Urine: 1.021 (ref 1.005–1.030)
pH: 6 (ref 5.0–8.0)

## 2020-04-30 LAB — APTT: aPTT: 31 seconds (ref 24–36)

## 2020-04-30 LAB — PROTIME-INR
INR: 0.9 (ref 0.8–1.2)
Prothrombin Time: 12.2 seconds (ref 11.4–15.2)

## 2020-04-30 LAB — HEMOGLOBIN A1C
Hgb A1c MFr Bld: 5.8 % — ABNORMAL HIGH (ref 4.8–5.6)
Mean Plasma Glucose: 119.76 mg/dL

## 2020-04-30 LAB — SARS CORONAVIRUS 2 (TAT 6-24 HRS): SARS Coronavirus 2: NEGATIVE

## 2020-04-30 IMAGING — CR DG CHEST 2V
2 series · 2 of 2 positions shown · non-contrast
Comparison: [DATE] CT

CLINICAL DATA: Cough preop evaluation for upcoming coronary bypass
graph

EXAM:
CHEST - 2 VIEW

[w chest pa]
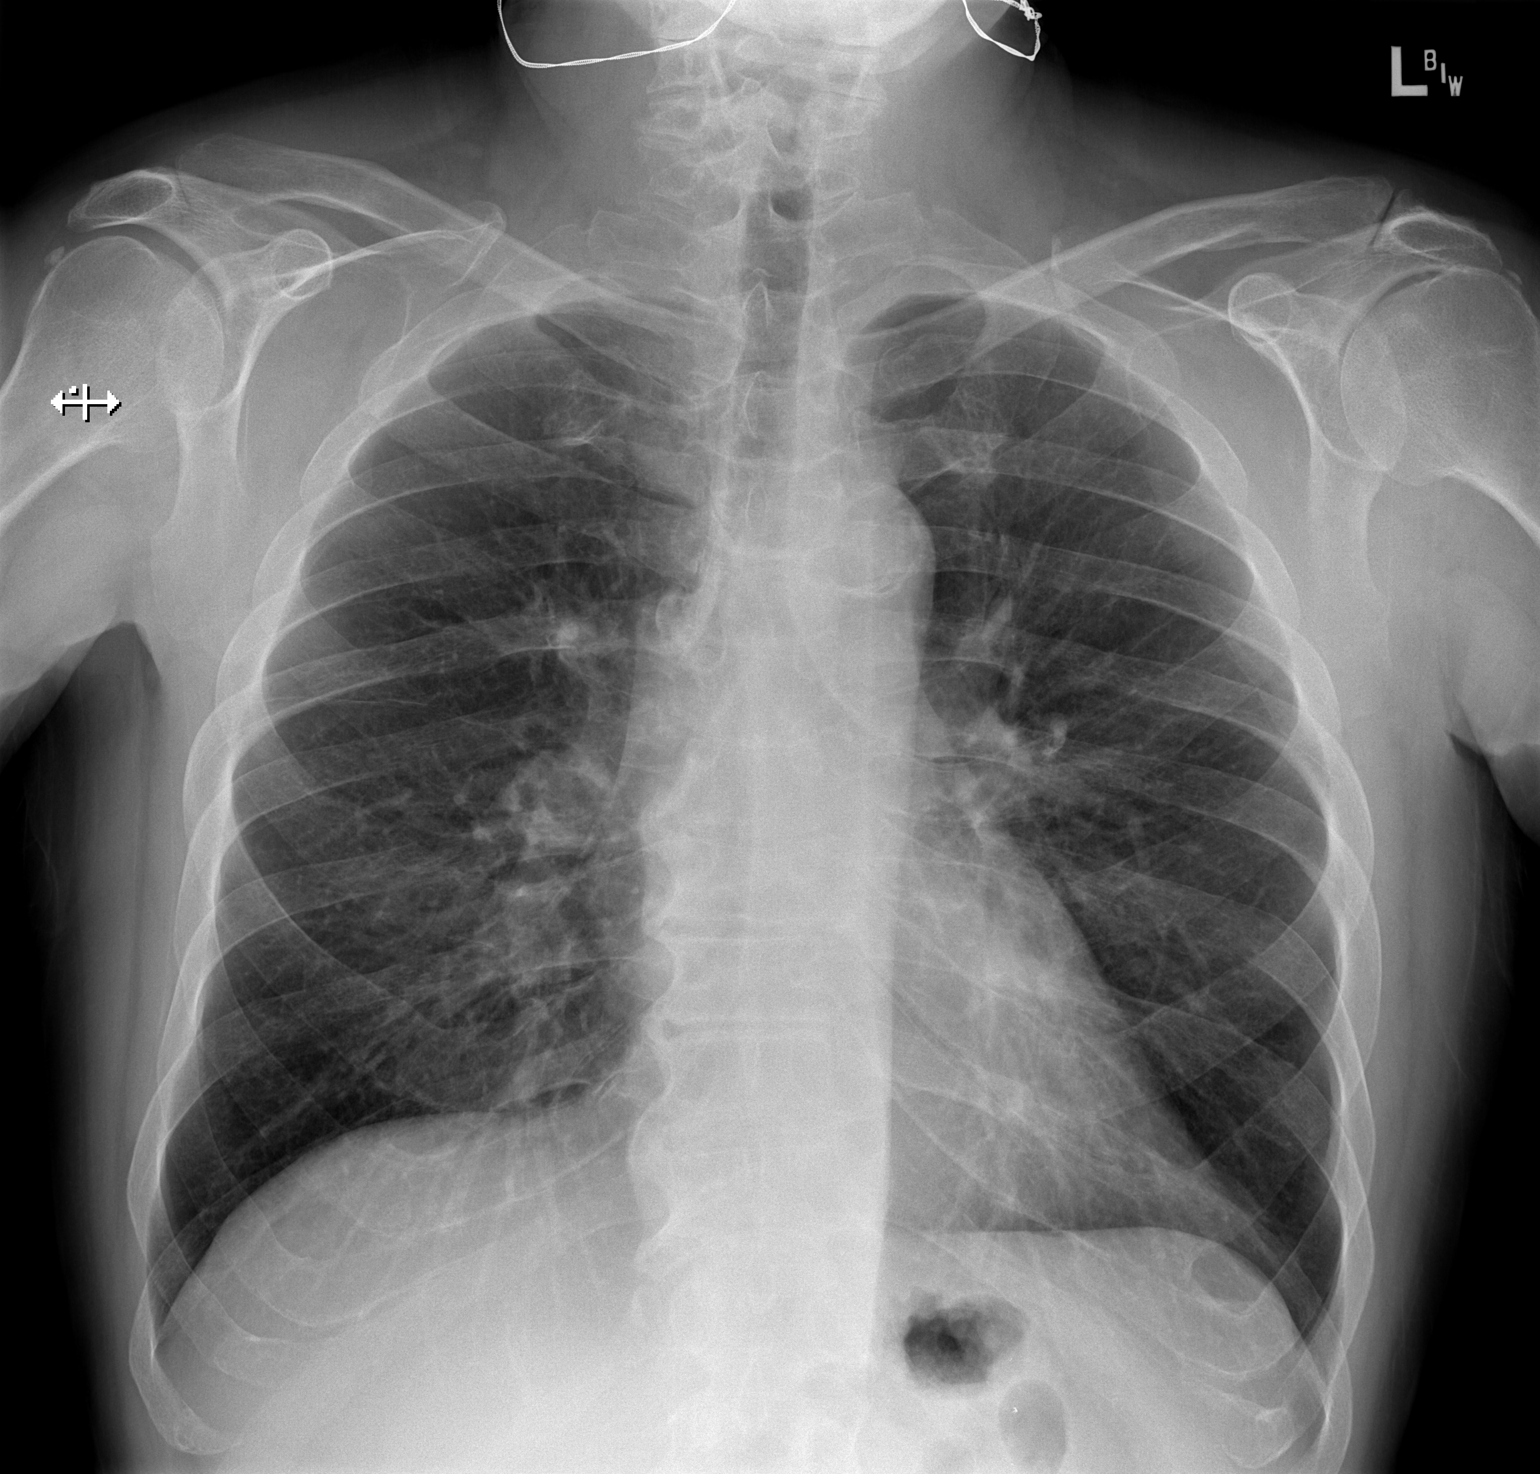

[w chest lat]
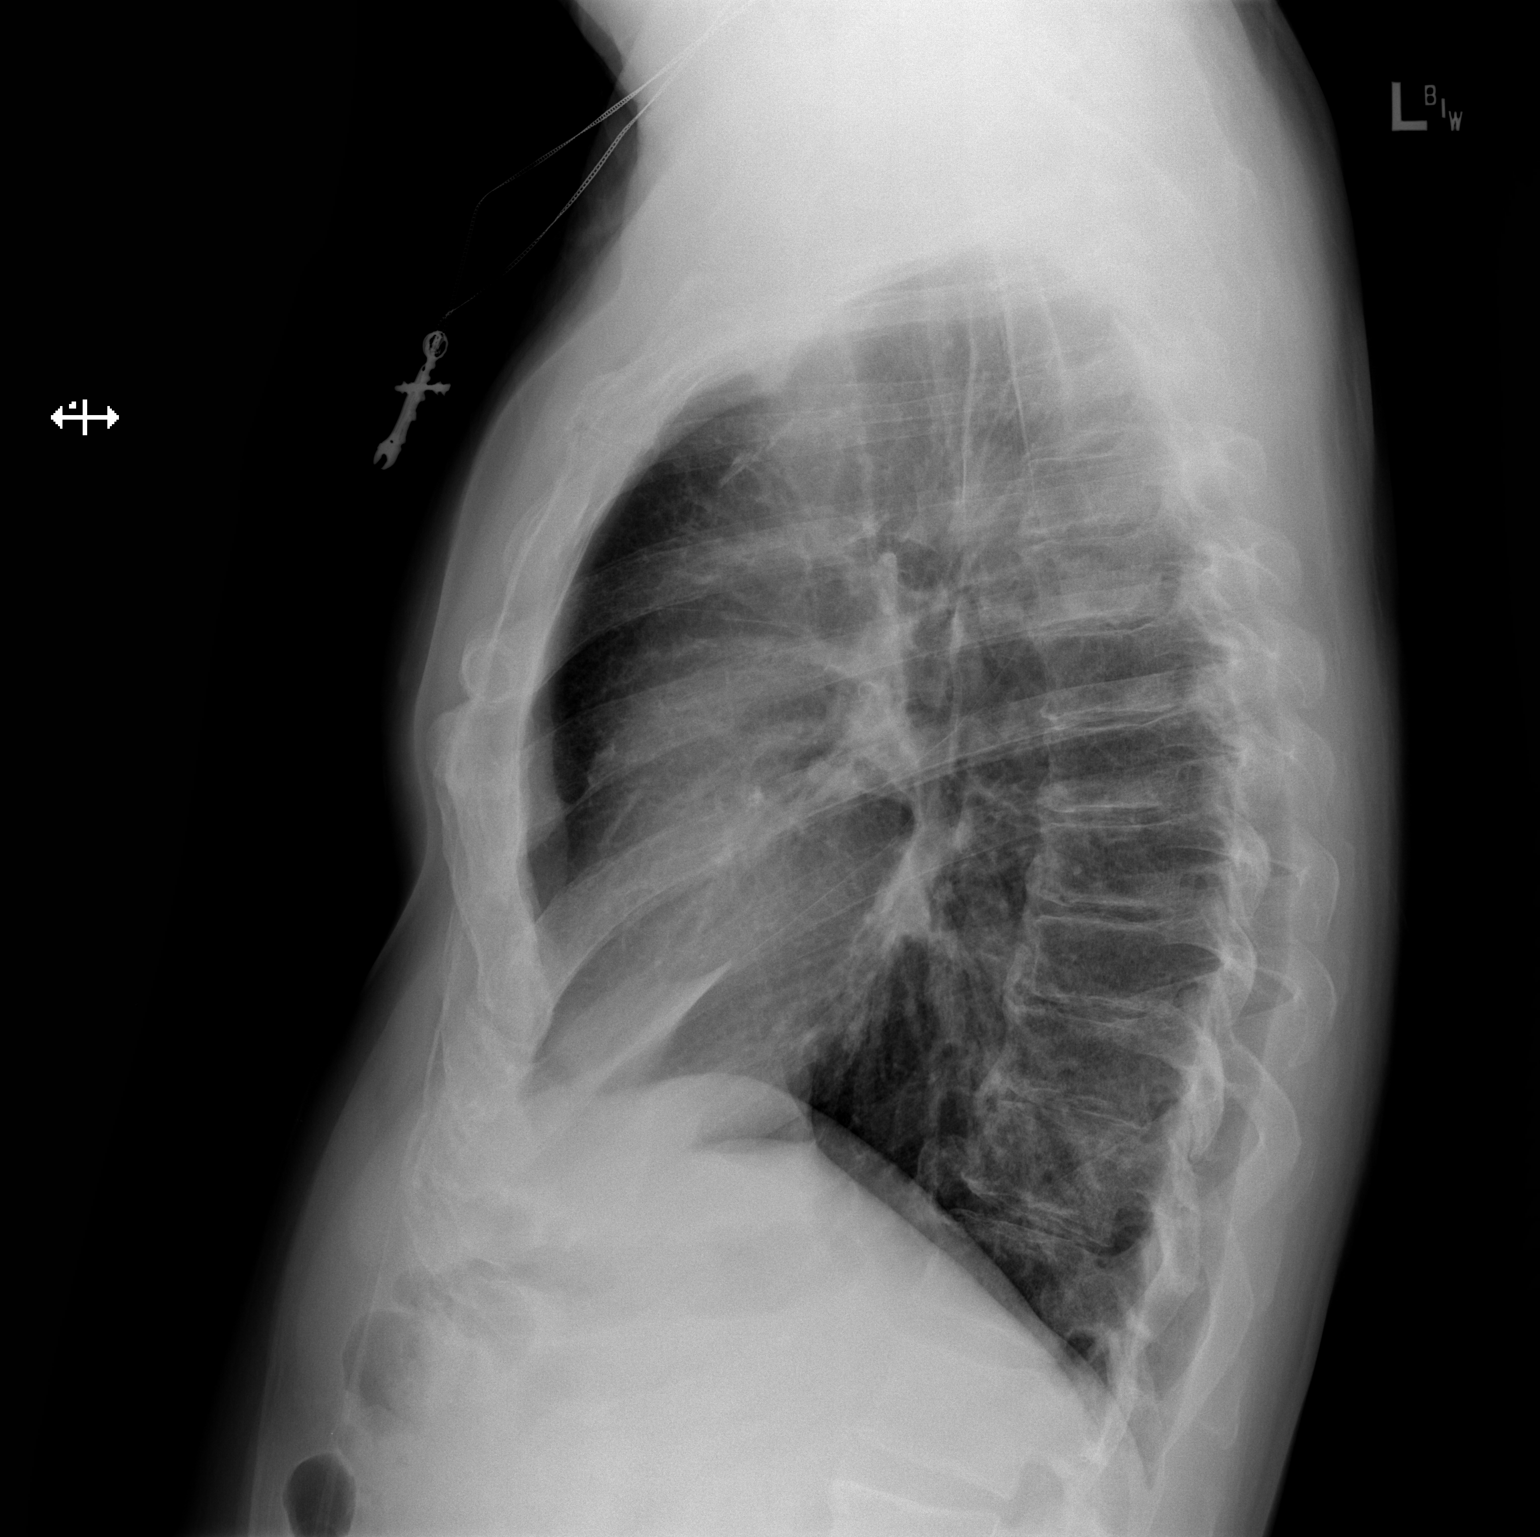

[2 of 2 positions shown; findings below may reference images not displayed]

FINDINGS: Cardiac shadow is within normal limits. Aortic calcifications are
noted. The lungs are well aerated bilaterally. Previously seen
nodular changes are not well appreciated on this exam. Degenerative
change of thoracic spine is noted.
IMPRESSION: No acute abnormality noted.

## 2020-04-30 MED ORDER — PLASMA-LYTE 148 IV SOLN
INTRAVENOUS | Status: DC
  Filled 2020-04-30: qty 2.5

## 2020-04-30 MED ORDER — LEVOFLOXACIN IN D5W 500 MG/100ML IV SOLN
500.0000 mg | INTRAVENOUS | Status: AC
  Administered 2020-05-01: 500 mg via INTRAVENOUS
  Filled 2020-04-30: qty 100

## 2020-04-30 MED ORDER — POTASSIUM CHLORIDE 2 MEQ/ML IV SOLN
80.0000 meq | INTRAVENOUS | Status: DC
  Filled 2020-04-30: qty 40

## 2020-04-30 MED ORDER — TRANEXAMIC ACID (OHS) BOLUS VIA INFUSION
15.0000 mg/kg | INTRAVENOUS | Status: AC
  Administered 2020-05-01: 1057.5 mg via INTRAVENOUS
  Filled 2020-04-30: qty 1058

## 2020-04-30 MED ORDER — SODIUM CHLORIDE 0.9 % IV SOLN
INTRAVENOUS | Status: DC
  Filled 2020-04-30: qty 30

## 2020-04-30 MED ORDER — MAGNESIUM SULFATE 50 % IJ SOLN
40.0000 meq | INTRAMUSCULAR | Status: DC
  Filled 2020-04-30: qty 9.85

## 2020-04-30 MED ORDER — MILRINONE LACTATE IN DEXTROSE 20-5 MG/100ML-% IV SOLN
0.3000 ug/kg/min | INTRAVENOUS | Status: AC
  Administered 2020-05-01: .25 ug/kg/min via INTRAVENOUS
  Filled 2020-04-30: qty 100

## 2020-04-30 MED ORDER — NOREPINEPHRINE 4 MG/250ML-% IV SOLN
0.0000 ug/min | INTRAVENOUS | Status: DC
  Filled 2020-04-30: qty 250

## 2020-04-30 MED ORDER — PHENYLEPHRINE HCL-NACL 20-0.9 MG/250ML-% IV SOLN
30.0000 ug/min | INTRAVENOUS | Status: AC
  Administered 2020-05-01: 30 ug/min via INTRAVENOUS
  Filled 2020-04-30: qty 250

## 2020-04-30 MED ORDER — EPINEPHRINE HCL 5 MG/250ML IV SOLN IN NS
0.0000 ug/min | INTRAVENOUS | Status: DC
  Filled 2020-04-30: qty 250

## 2020-04-30 MED ORDER — VANCOMYCIN HCL 1250 MG/250ML IV SOLN
1250.0000 mg | INTRAVENOUS | Status: AC
  Administered 2020-05-01: 1250 mg via INTRAVENOUS
  Filled 2020-04-30: qty 250

## 2020-04-30 MED ORDER — DEXMEDETOMIDINE HCL IN NACL 400 MCG/100ML IV SOLN
0.1000 ug/kg/h | INTRAVENOUS | Status: AC
  Administered 2020-05-01: .3 ug/kg/h via INTRAVENOUS
  Filled 2020-04-30: qty 100

## 2020-04-30 MED ORDER — TRANEXAMIC ACID 1000 MG/10ML IV SOLN
1.5000 mg/kg/h | INTRAVENOUS | Status: AC
  Administered 2020-05-01: 1.5 mg/kg/h via INTRAVENOUS
  Filled 2020-04-30: qty 25

## 2020-04-30 MED ORDER — TRANEXAMIC ACID (OHS) PUMP PRIME SOLUTION
2.0000 mg/kg | INTRAVENOUS | Status: DC
  Filled 2020-04-30: qty 1.41

## 2020-04-30 MED ORDER — NITROGLYCERIN IN D5W 200-5 MCG/ML-% IV SOLN
2.0000 ug/min | INTRAVENOUS | Status: AC
  Administered 2020-05-01: 16.6 ug/min via INTRAVENOUS
  Filled 2020-04-30: qty 250

## 2020-04-30 MED ORDER — INSULIN REGULAR(HUMAN) IN NACL 100-0.9 UT/100ML-% IV SOLN
INTRAVENOUS | Status: AC
  Administered 2020-05-01: .9 [IU]/h via INTRAVENOUS
  Filled 2020-04-30: qty 100

## 2020-04-30 NOTE — Progress Notes (Signed)
PCP - Dr. Moreen Fowler Cardiologist - Dr. Marlou Porch  Chest x-ray - 04/30/20 EKG - 04/06/20 Stress Test - 04/05/20 ECHO - 04/05/20 Cardiac Cath - 04/10/20   Blood Thinner Instructions: n/a Aspirin Instructions: Follow your surgeon's instructions on when to stop Aspirin.  If no instructions were given by your surgeon then you will need to call the office to get those instructions.    Per pt he will call surgeon as soon as he leaves PAT to check about ASA instructions, but he was pretty confident he was told to continue taking his ASA.   COVID TEST- 04/30/20  Coronavirus Screening  Have you experienced the following symptoms:  Cough yes/no: No Fever (>100.45F)  yes/no: No Runny nose yes/no: No Sore throat yes/no: No Difficulty breathing/shortness of breath  yes/no: No  Have you or a family member traveled in the last 14 days and where? yes/no: No   If the patient indicates "YES" to the above questions, their PAT will be rescheduled to limit the exposure to others and, the surgeon will be notified. THE PATIENT WILL NEED TO BE ASYMPTOMATIC FOR 14 DAYS.   If the patient is not experiencing any of these symptoms, the PAT nurse will instruct them to NOT bring anyone with them to their appointment since they may have these symptoms or traveled as well.   Please remind your patients and families that hospital visitation restrictions are in effect and the importance of the restrictions.   Anesthesia review: cardiac hx  Patient denies shortness of breath, fever, cough and chest pain at PAT appointment   All instructions explained to the patient, with a verbal understanding of the material. Patient agrees to go over the instructions while at home for a better understanding. Patient also instructed to self quarantine after being tested for COVID-19. The opportunity to ask questions was provided.

## 2020-04-30 NOTE — Progress Notes (Signed)
VASCULAR LAB    Pre CABG Dopplers have been performed.  See CV proc for preliminary results.   Cinthya Bors, RVT 04/30/2020, 9:49 AM

## 2020-05-01 ENCOUNTER — Encounter (HOSPITAL_COMMUNITY): Payer: Self-pay | Admitting: Cardiothoracic Surgery

## 2020-05-01 ENCOUNTER — Inpatient Hospital Stay (HOSPITAL_COMMUNITY): Payer: BC Managed Care – PPO | Admitting: Registered Nurse

## 2020-05-01 ENCOUNTER — Inpatient Hospital Stay (HOSPITAL_COMMUNITY)
Admission: RE | Disposition: A | Discharge status: Home / Self Care | Payer: Self-pay | Source: Home / Self Care | Attending: Cardiothoracic Surgery

## 2020-05-01 ENCOUNTER — Inpatient Hospital Stay (HOSPITAL_COMMUNITY): Payer: BC Managed Care – PPO

## 2020-05-01 ENCOUNTER — Inpatient Hospital Stay (HOSPITAL_COMMUNITY)
Admission: RE | Admit: 2020-05-01 | Discharge: 2020-05-07 | DRG: 236 | Disposition: A | Discharge status: Home / Self Care | Payer: BC Managed Care – PPO | Attending: Cardiothoracic Surgery | Admitting: Cardiothoracic Surgery

## 2020-05-01 DIAGNOSIS — Z951 Presence of aortocoronary bypass graft: Secondary | ICD-10-CM

## 2020-05-01 DIAGNOSIS — I2510 Atherosclerotic heart disease of native coronary artery without angina pectoris: Secondary | ICD-10-CM | POA: Diagnosis present

## 2020-05-01 DIAGNOSIS — I25118 Atherosclerotic heart disease of native coronary artery with other forms of angina pectoris: Secondary | ICD-10-CM | POA: Diagnosis present

## 2020-05-01 DIAGNOSIS — M1990 Unspecified osteoarthritis, unspecified site: Secondary | ICD-10-CM | POA: Diagnosis present

## 2020-05-01 DIAGNOSIS — Z91018 Allergy to other foods: Secondary | ICD-10-CM | POA: Diagnosis not present

## 2020-05-01 DIAGNOSIS — D62 Acute posthemorrhagic anemia: Secondary | ICD-10-CM | POA: Diagnosis not present

## 2020-05-01 DIAGNOSIS — D696 Thrombocytopenia, unspecified: Secondary | ICD-10-CM | POA: Diagnosis not present

## 2020-05-01 DIAGNOSIS — I10 Essential (primary) hypertension: Secondary | ICD-10-CM | POA: Diagnosis present

## 2020-05-01 DIAGNOSIS — Z880 Allergy status to penicillin: Secondary | ICD-10-CM | POA: Diagnosis not present

## 2020-05-01 DIAGNOSIS — K219 Gastro-esophageal reflux disease without esophagitis: Secondary | ICD-10-CM | POA: Diagnosis present

## 2020-05-01 DIAGNOSIS — E8770 Fluid overload, unspecified: Secondary | ICD-10-CM | POA: Diagnosis not present

## 2020-05-01 DIAGNOSIS — Z8701 Personal history of pneumonia (recurrent): Secondary | ICD-10-CM

## 2020-05-01 DIAGNOSIS — I350 Nonrheumatic aortic (valve) stenosis: Secondary | ICD-10-CM | POA: Diagnosis present

## 2020-05-01 DIAGNOSIS — E7800 Pure hypercholesterolemia, unspecified: Secondary | ICD-10-CM | POA: Diagnosis present

## 2020-05-01 DIAGNOSIS — M4800 Spinal stenosis, site unspecified: Secondary | ICD-10-CM | POA: Diagnosis present

## 2020-05-01 DIAGNOSIS — R000 Tachycardia, unspecified: Secondary | ICD-10-CM | POA: Diagnosis not present

## 2020-05-01 DIAGNOSIS — J9811 Atelectasis: Secondary | ICD-10-CM | POA: Diagnosis not present

## 2020-05-01 DIAGNOSIS — Z885 Allergy status to narcotic agent status: Secondary | ICD-10-CM | POA: Diagnosis not present

## 2020-05-01 DIAGNOSIS — Z888 Allergy status to other drugs, medicaments and biological substances status: Secondary | ICD-10-CM

## 2020-05-01 DIAGNOSIS — J90 Pleural effusion, not elsewhere classified: Secondary | ICD-10-CM

## 2020-05-01 DIAGNOSIS — Z20822 Contact with and (suspected) exposure to covid-19: Secondary | ICD-10-CM | POA: Diagnosis present

## 2020-05-01 DIAGNOSIS — Z96651 Presence of right artificial knee joint: Secondary | ICD-10-CM | POA: Diagnosis present

## 2020-05-01 DIAGNOSIS — I2541 Coronary artery aneurysm: Secondary | ICD-10-CM | POA: Diagnosis present

## 2020-05-01 DIAGNOSIS — Z87891 Personal history of nicotine dependence: Secondary | ICD-10-CM

## 2020-05-01 DIAGNOSIS — R911 Solitary pulmonary nodule: Secondary | ICD-10-CM | POA: Diagnosis present

## 2020-05-01 DIAGNOSIS — Z09 Encounter for follow-up examination after completed treatment for conditions other than malignant neoplasm: Secondary | ICD-10-CM

## 2020-05-01 DIAGNOSIS — I25110 Atherosclerotic heart disease of native coronary artery with unstable angina pectoris: Principal | ICD-10-CM | POA: Diagnosis present

## 2020-05-01 DIAGNOSIS — Q231 Congenital insufficiency of aortic valve: Secondary | ICD-10-CM

## 2020-05-01 DIAGNOSIS — M5430 Sciatica, unspecified side: Secondary | ICD-10-CM | POA: Diagnosis present

## 2020-05-01 DIAGNOSIS — J939 Pneumothorax, unspecified: Secondary | ICD-10-CM

## 2020-05-01 DIAGNOSIS — E785 Hyperlipidemia, unspecified: Secondary | ICD-10-CM | POA: Diagnosis present

## 2020-05-01 HISTORY — PX: ENDOVEIN HARVEST OF GREATER SAPHENOUS VEIN: SHX5059

## 2020-05-01 HISTORY — PX: CORONARY ARTERY BYPASS GRAFT: SHX141

## 2020-05-01 HISTORY — PX: TEE WITHOUT CARDIOVERSION: SHX5443

## 2020-05-01 LAB — POCT I-STAT, CHEM 8
BUN: 8 mg/dL (ref 6–20)
BUN: 7 mg/dL (ref 6–20)
BUN: 8 mg/dL (ref 6–20)
BUN: 8 mg/dL (ref 6–20)
BUN: 6 mg/dL (ref 6–20)
BUN: 6 mg/dL (ref 6–20)
Calcium, Ion: 1.28 mmol/L (ref 1.15–1.40)
Calcium, Ion: 1.29 mmol/L (ref 1.15–1.40)
Calcium, Ion: 1.27 mmol/L (ref 1.15–1.40)
Calcium, Ion: 1.33 mmol/L (ref 1.15–1.40)
Calcium, Ion: 1.07 mmol/L — ABNORMAL LOW (ref 1.15–1.40)
Calcium, Ion: 1.01 mmol/L — ABNORMAL LOW (ref 1.15–1.40)
Chloride: 103 mmol/L (ref 98–111)
Chloride: 102 mmol/L (ref 98–111)
Chloride: 102 mmol/L (ref 98–111)
Chloride: 103 mmol/L (ref 98–111)
Chloride: 100 mmol/L (ref 98–111)
Chloride: 100 mmol/L (ref 98–111)
Creatinine, Ser: 0.5 mg/dL — ABNORMAL LOW (ref 0.61–1.24)
Creatinine, Ser: 0.5 mg/dL — ABNORMAL LOW (ref 0.61–1.24)
Creatinine, Ser: 0.5 mg/dL — ABNORMAL LOW (ref 0.61–1.24)
Creatinine, Ser: 0.5 mg/dL — ABNORMAL LOW (ref 0.61–1.24)
Creatinine, Ser: 0.4 mg/dL — ABNORMAL LOW (ref 0.61–1.24)
Creatinine, Ser: 0.4 mg/dL — ABNORMAL LOW (ref 0.61–1.24)
Glucose, Bld: 118 mg/dL — ABNORMAL HIGH (ref 70–99)
Glucose, Bld: 125 mg/dL — ABNORMAL HIGH (ref 70–99)
Glucose, Bld: 140 mg/dL — ABNORMAL HIGH (ref 70–99)
Glucose, Bld: 140 mg/dL — ABNORMAL HIGH (ref 70–99)
Glucose, Bld: 128 mg/dL — ABNORMAL HIGH (ref 70–99)
Glucose, Bld: 146 mg/dL — ABNORMAL HIGH (ref 70–99)
HCT: 39 % (ref 39.0–52.0)
HCT: 34 % — ABNORMAL LOW (ref 39.0–52.0)
HCT: 32 % — ABNORMAL LOW (ref 39.0–52.0)
HCT: 32 % — ABNORMAL LOW (ref 39.0–52.0)
HCT: 26 % — ABNORMAL LOW (ref 39.0–52.0)
HCT: 28 % — ABNORMAL LOW (ref 39.0–52.0)
Hemoglobin: 13.3 g/dL (ref 13.0–17.0)
Hemoglobin: 11.6 g/dL — ABNORMAL LOW (ref 13.0–17.0)
Hemoglobin: 10.9 g/dL — ABNORMAL LOW (ref 13.0–17.0)
Hemoglobin: 10.9 g/dL — ABNORMAL LOW (ref 13.0–17.0)
Hemoglobin: 8.8 g/dL — ABNORMAL LOW (ref 13.0–17.0)
Hemoglobin: 9.5 g/dL — ABNORMAL LOW (ref 13.0–17.0)
Potassium: 4.1 mmol/L (ref 3.5–5.1)
Potassium: 4 mmol/L (ref 3.5–5.1)
Potassium: 4.2 mmol/L (ref 3.5–5.1)
Potassium: 4.3 mmol/L (ref 3.5–5.1)
Potassium: 4.2 mmol/L (ref 3.5–5.1)
Potassium: 3.7 mmol/L (ref 3.5–5.1)
Sodium: 136 mmol/L (ref 135–145)
Sodium: 134 mmol/L — ABNORMAL LOW (ref 135–145)
Sodium: 135 mmol/L (ref 135–145)
Sodium: 136 mmol/L (ref 135–145)
Sodium: 136 mmol/L (ref 135–145)
Sodium: 136 mmol/L (ref 135–145)
TCO2: 22 mmol/L (ref 22–32)
TCO2: 23 mmol/L (ref 22–32)
TCO2: 22 mmol/L (ref 22–32)
TCO2: 25 mmol/L (ref 22–32)
TCO2: 24 mmol/L (ref 22–32)
TCO2: 24 mmol/L (ref 22–32)

## 2020-05-01 LAB — MAGNESIUM: Magnesium: 2.4 mg/dL (ref 1.7–2.4)

## 2020-05-01 LAB — POCT I-STAT 7, (LYTES, BLD GAS, ICA,H+H)
Acid-Base Excess: 1 mmol/L (ref 0.0–2.0)
Acid-Base Excess: 1 mmol/L (ref 0.0–2.0)
Acid-Base Excess: 1 mmol/L (ref 0.0–2.0)
Acid-base deficit: 1 mmol/L (ref 0.0–2.0)
Acid-base deficit: 1 mmol/L (ref 0.0–2.0)
Acid-base deficit: 1 mmol/L (ref 0.0–2.0)
Acid-base deficit: 3 mmol/L — ABNORMAL HIGH (ref 0.0–2.0)
Bicarbonate: 23.8 mmol/L (ref 20.0–28.0)
Bicarbonate: 26.4 mmol/L (ref 20.0–28.0)
Bicarbonate: 25.5 mmol/L (ref 20.0–28.0)
Bicarbonate: 25.5 mmol/L (ref 20.0–28.0)
Bicarbonate: 24.8 mmol/L (ref 20.0–28.0)
Bicarbonate: 23.5 mmol/L (ref 20.0–28.0)
Bicarbonate: 22.4 mmol/L (ref 20.0–28.0)
Calcium, Ion: 1.27 mmol/L (ref 1.15–1.40)
Calcium, Ion: 1.02 mmol/L — ABNORMAL LOW (ref 1.15–1.40)
Calcium, Ion: 1.03 mmol/L — ABNORMAL LOW (ref 1.15–1.40)
Calcium, Ion: 1.02 mmol/L — ABNORMAL LOW (ref 1.15–1.40)
Calcium, Ion: 1.15 mmol/L (ref 1.15–1.40)
Calcium, Ion: 1.11 mmol/L — ABNORMAL LOW (ref 1.15–1.40)
Calcium, Ion: 1.16 mmol/L (ref 1.15–1.40)
HCT: 34 % — ABNORMAL LOW (ref 39.0–52.0)
HCT: 22 % — ABNORMAL LOW (ref 39.0–52.0)
HCT: 24 % — ABNORMAL LOW (ref 39.0–52.0)
HCT: 26 % — ABNORMAL LOW (ref 39.0–52.0)
HCT: 24 % — ABNORMAL LOW (ref 39.0–52.0)
HCT: 19 % — ABNORMAL LOW (ref 39.0–52.0)
HCT: 19 % — ABNORMAL LOW (ref 39.0–52.0)
Hemoglobin: 11.6 g/dL — ABNORMAL LOW (ref 13.0–17.0)
Hemoglobin: 7.5 g/dL — ABNORMAL LOW (ref 13.0–17.0)
Hemoglobin: 8.2 g/dL — ABNORMAL LOW (ref 13.0–17.0)
Hemoglobin: 8.8 g/dL — ABNORMAL LOW (ref 13.0–17.0)
Hemoglobin: 8.2 g/dL — ABNORMAL LOW (ref 13.0–17.0)
Hemoglobin: 6.5 g/dL — CL (ref 13.0–17.0)
Hemoglobin: 6.5 g/dL — CL (ref 13.0–17.0)
O2 Saturation: 100 %
O2 Saturation: 100 %
O2 Saturation: 100 %
O2 Saturation: 100 %
O2 Saturation: 99 %
O2 Saturation: 99 %
O2 Saturation: 98 %
Patient temperature: 35.4
Patient temperature: 37.8
Patient temperature: 37.5
Potassium: 4.1 mmol/L (ref 3.5–5.1)
Potassium: 4 mmol/L (ref 3.5–5.1)
Potassium: 4.2 mmol/L (ref 3.5–5.1)
Potassium: 3.8 mmol/L (ref 3.5–5.1)
Potassium: 3.9 mmol/L (ref 3.5–5.1)
Potassium: 4.6 mmol/L (ref 3.5–5.1)
Potassium: 5 mmol/L (ref 3.5–5.1)
Sodium: 135 mmol/L (ref 135–145)
Sodium: 138 mmol/L (ref 135–145)
Sodium: 138 mmol/L (ref 135–145)
Sodium: 138 mmol/L (ref 135–145)
Sodium: 137 mmol/L (ref 135–145)
Sodium: 137 mmol/L (ref 135–145)
Sodium: 137 mmol/L (ref 135–145)
TCO2: 25 mmol/L (ref 22–32)
TCO2: 28 mmol/L (ref 22–32)
TCO2: 27 mmol/L (ref 22–32)
TCO2: 27 mmol/L (ref 22–32)
TCO2: 26 mmol/L (ref 22–32)
TCO2: 25 mmol/L (ref 22–32)
TCO2: 24 mmol/L (ref 22–32)
pCO2 arterial: 39.3 mmHg (ref 32.0–48.0)
pCO2 arterial: 44 mmHg (ref 32.0–48.0)
pCO2 arterial: 40.7 mmHg (ref 32.0–48.0)
pCO2 arterial: 41.1 mmHg (ref 32.0–48.0)
pCO2 arterial: 40.1 mmHg (ref 32.0–48.0)
pCO2 arterial: 39.3 mmHg (ref 32.0–48.0)
pCO2 arterial: 40.8 mmHg (ref 32.0–48.0)
pH, Arterial: 7.389 (ref 7.350–7.450)
pH, Arterial: 7.386 (ref 7.350–7.450)
pH, Arterial: 7.404 (ref 7.350–7.450)
pH, Arterial: 7.401 (ref 7.350–7.450)
pH, Arterial: 7.392 (ref 7.350–7.450)
pH, Arterial: 7.388 (ref 7.350–7.450)
pH, Arterial: 7.35 (ref 7.350–7.450)
pO2, Arterial: 339 mmHg — ABNORMAL HIGH (ref 83.0–108.0)
pO2, Arterial: 348 mmHg — ABNORMAL HIGH (ref 83.0–108.0)
pO2, Arterial: 356 mmHg — ABNORMAL HIGH (ref 83.0–108.0)
pO2, Arterial: 222 mmHg — ABNORMAL HIGH (ref 83.0–108.0)
pO2, Arterial: 139 mmHg — ABNORMAL HIGH (ref 83.0–108.0)
pO2, Arterial: 120 mmHg — ABNORMAL HIGH (ref 83.0–108.0)
pO2, Arterial: 107 mmHg (ref 83.0–108.0)

## 2020-05-01 LAB — CBC
HCT: 23 % — ABNORMAL LOW (ref 39.0–52.0)
HCT: 22.8 % — ABNORMAL LOW (ref 39.0–52.0)
Hemoglobin: 8.1 g/dL — ABNORMAL LOW (ref 13.0–17.0)
Hemoglobin: 8.3 g/dL — ABNORMAL LOW (ref 13.0–17.0)
MCH: 34.6 pg — ABNORMAL HIGH (ref 26.0–34.0)
MCH: 34.6 pg — ABNORMAL HIGH (ref 26.0–34.0)
MCHC: 35.2 g/dL (ref 30.0–36.0)
MCHC: 36.4 g/dL — ABNORMAL HIGH (ref 30.0–36.0)
MCV: 98.3 fL (ref 80.0–100.0)
MCV: 95 fL (ref 80.0–100.0)
Platelets: 148 10*3/uL — ABNORMAL LOW (ref 150–400)
Platelets: 121 10*3/uL — ABNORMAL LOW (ref 150–400)
RBC: 2.34 MIL/uL — ABNORMAL LOW (ref 4.22–5.81)
RBC: 2.4 MIL/uL — ABNORMAL LOW (ref 4.22–5.81)
RDW: 12.2 % (ref 11.5–15.5)
RDW: 12.7 % (ref 11.5–15.5)
WBC: 14 10*3/uL — ABNORMAL HIGH (ref 4.0–10.5)
WBC: 10.4 10*3/uL (ref 4.0–10.5)
nRBC: 0 % (ref 0.0–0.2)
nRBC: 0 % (ref 0.0–0.2)

## 2020-05-01 LAB — PREPARE RBC (CROSSMATCH)

## 2020-05-01 LAB — POCT I-STAT EG7
Acid-Base Excess: 0 mmol/L (ref 0.0–2.0)
Bicarbonate: 25.9 mmol/L (ref 20.0–28.0)
Calcium, Ion: 1.06 mmol/L — ABNORMAL LOW (ref 1.15–1.40)
HCT: 23 % — ABNORMAL LOW (ref 39.0–52.0)
Hemoglobin: 7.8 g/dL — ABNORMAL LOW (ref 13.0–17.0)
O2 Saturation: 69 %
Potassium: 3.9 mmol/L (ref 3.5–5.1)
Sodium: 138 mmol/L (ref 135–145)
TCO2: 27 mmol/L (ref 22–32)
pCO2, Ven: 46.9 mmHg (ref 44.0–60.0)
pH, Ven: 7.35 (ref 7.250–7.430)
pO2, Ven: 38 mmHg (ref 32.0–45.0)

## 2020-05-01 LAB — GLUCOSE, CAPILLARY
Glucose-Capillary: 148 mg/dL — ABNORMAL HIGH (ref 70–99)
Glucose-Capillary: 132 mg/dL — ABNORMAL HIGH (ref 70–99)
Glucose-Capillary: 140 mg/dL — ABNORMAL HIGH (ref 70–99)
Glucose-Capillary: 133 mg/dL — ABNORMAL HIGH (ref 70–99)
Glucose-Capillary: 123 mg/dL — ABNORMAL HIGH (ref 70–99)
Glucose-Capillary: 141 mg/dL — ABNORMAL HIGH (ref 70–99)

## 2020-05-01 LAB — PROTIME-INR
INR: 1.4 — ABNORMAL HIGH (ref 0.8–1.2)
Prothrombin Time: 16.9 seconds — ABNORMAL HIGH (ref 11.4–15.2)

## 2020-05-01 LAB — ECHO INTRAOPERATIVE TEE
AV Mean grad: 5 mmHg
Height: 68 in
Weight: 2512 oz

## 2020-05-01 LAB — BASIC METABOLIC PANEL
Anion gap: 7 (ref 5–15)
BUN: 7 mg/dL (ref 6–20)
CO2: 21 mmol/L — ABNORMAL LOW (ref 22–32)
Calcium: 7.6 mg/dL — ABNORMAL LOW (ref 8.9–10.3)
Chloride: 106 mmol/L (ref 98–111)
Creatinine, Ser: 0.68 mg/dL (ref 0.61–1.24)
GFR, Estimated: >60 mL/min (ref >60)
Glucose, Bld: 160 mg/dL — ABNORMAL HIGH (ref 70–99)
Potassium: 4.3 mmol/L (ref 3.5–5.1)
Sodium: 134 mmol/L — ABNORMAL LOW (ref 135–145)

## 2020-05-01 LAB — HEMOGLOBIN AND HEMATOCRIT, BLOOD
HCT: 24.4 % — ABNORMAL LOW (ref 39.0–52.0)
Hemoglobin: 8.3 g/dL — ABNORMAL LOW (ref 13.0–17.0)

## 2020-05-01 LAB — COOXEMETRY PANEL
Carboxyhemoglobin: 1.6 % — ABNORMAL HIGH (ref 0.5–1.5)
Methemoglobin: 1.1 % (ref 0.0–1.5)
O2 Saturation: 89.5 %
Total hemoglobin: 8.2 g/dL — ABNORMAL LOW (ref 12.0–16.0)

## 2020-05-01 LAB — PLATELET COUNT: Platelets: 146 10*3/uL — ABNORMAL LOW (ref 150–400)

## 2020-05-01 LAB — APTT: aPTT: 30 seconds (ref 24–36)

## 2020-05-01 IMAGING — DX DG CHEST 1V PORT
1 series · 1 of 1 positions shown · non-contrast
Comparison: One-view chest x-ray [DATE]

CLINICAL DATA: CABG

EXAM:
PORTABLE CHEST 1 VIEW

[chest ap]
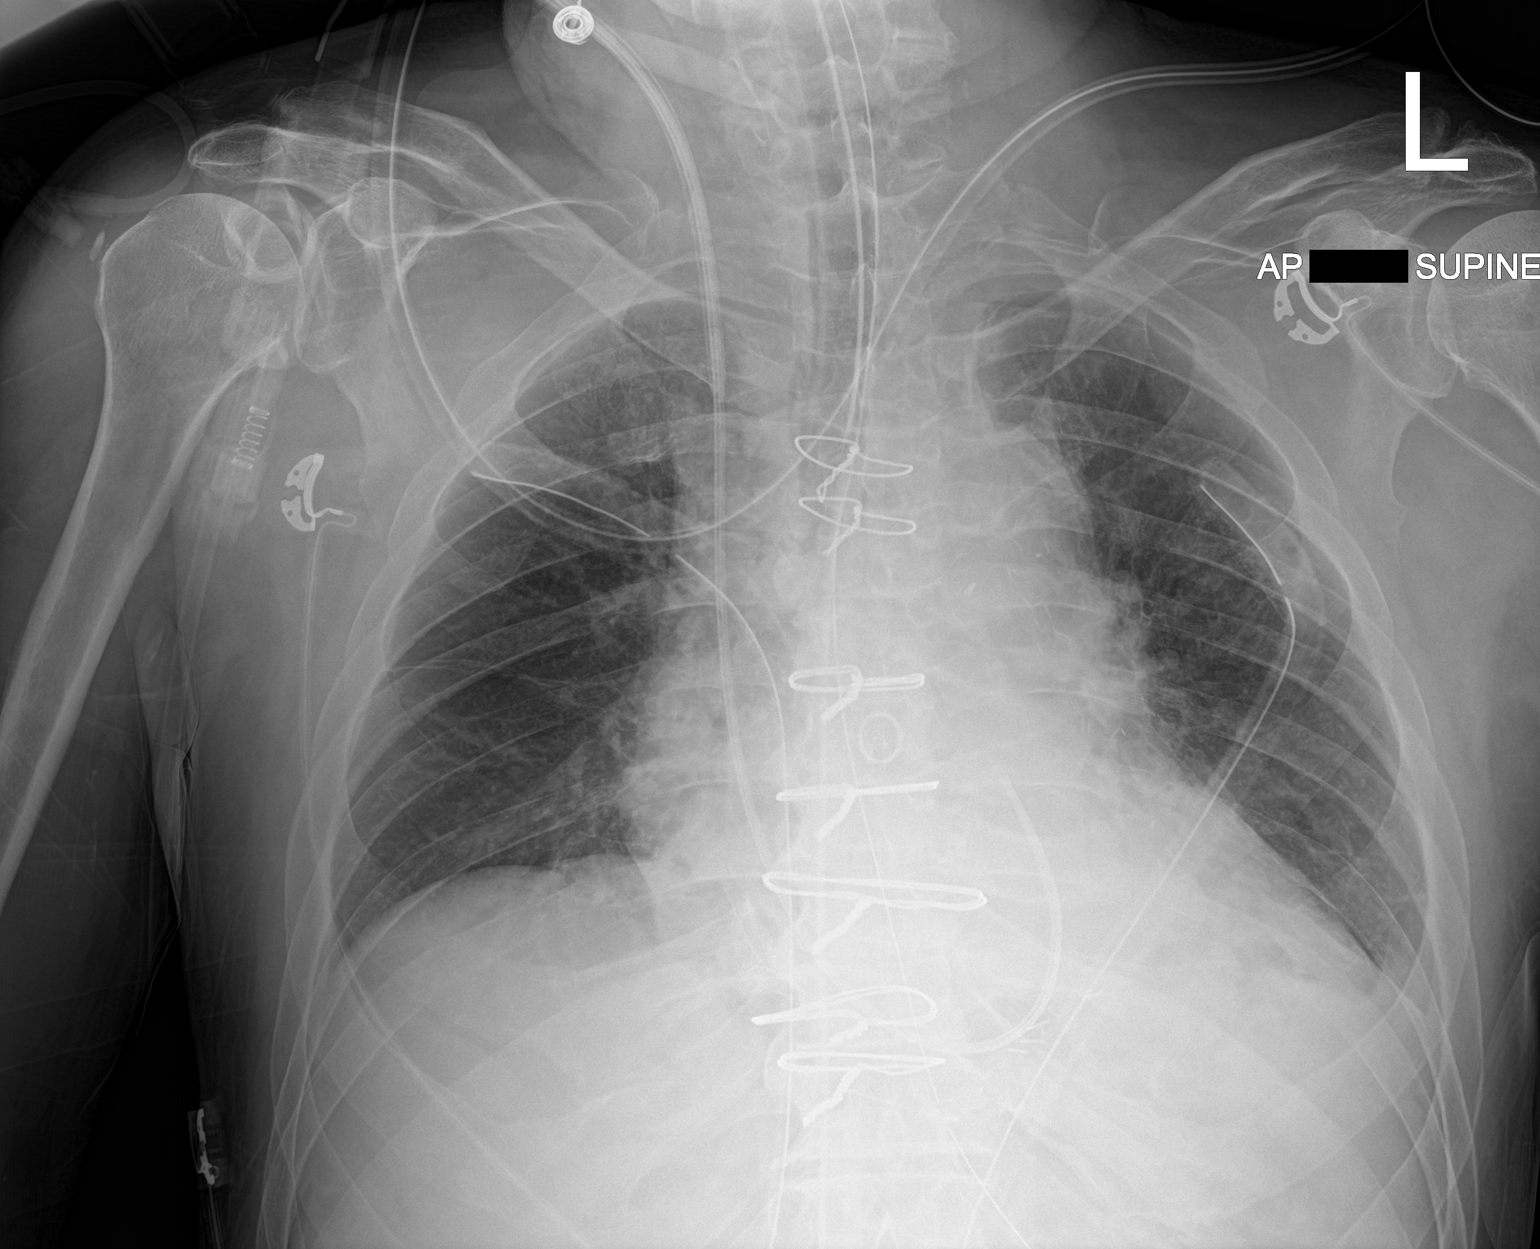

[1 of 1 positions shown; findings below may reference images not displayed]

FINDINGS: Heart is enlarged. Patient is now status post median sternotomy for
CABG. Tip of the Swan-Ganz catheter terminates in the main pulmonary
outflow tract. It enters via a right IJ sheath. Bilateral chest
tubes are in place without pneumothorax. Mediastinal drain is noted.
Endotracheal tube is in satisfactory position 4 cm above the carina.
Side port of the NG tube is just below the GE junction.

Lung volumes are low. Small left pleural effusion is present. Mild
left basilar atelectasis is present.
IMPRESSION: 1. Status post median sternotomy for CABG.
2. Small left pleural effusion and mild left basilar atelectasis.
3. No pneumothorax.
4. Satisfactory positioning of support apparatus.

## 2020-05-01 SURGERY — CORONARY ARTERY BYPASS GRAFTING (CABG)
Anesthesia: General | Site: Chest | Laterality: Right

## 2020-05-01 MED ORDER — FENTANYL CITRATE (PF) 250 MCG/5ML IJ SOLN
INTRAMUSCULAR | Status: AC
  Filled 2020-05-01: qty 20

## 2020-05-01 MED ORDER — MAGNESIUM SULFATE 4 GM/100ML IV SOLN
4.0000 g | Freq: Once | INTRAVENOUS | Status: AC
  Administered 2020-05-01: 4 g via INTRAVENOUS
  Filled 2020-05-01: qty 100

## 2020-05-01 MED ORDER — FAMOTIDINE IN NACL 20-0.9 MG/50ML-% IV SOLN
20.0000 mg | Freq: Two times a day (BID) | INTRAVENOUS | Status: AC
  Administered 2020-05-01 (×2): 20 mg via INTRAVENOUS
  Filled 2020-05-01 (×2): qty 50

## 2020-05-01 MED ORDER — SODIUM CHLORIDE 0.9% FLUSH
3.0000 mL | Freq: Two times a day (BID) | INTRAVENOUS | Status: DC
  Administered 2020-05-02 – 2020-05-05 (×5): 3 mL via INTRAVENOUS

## 2020-05-01 MED ORDER — FENTANYL CITRATE (PF) 250 MCG/5ML IJ SOLN
INTRAMUSCULAR | Status: AC
  Filled 2020-05-01: qty 5

## 2020-05-01 MED ORDER — HEPARIN SODIUM (PORCINE) 1000 UNIT/ML IJ SOLN
INTRAMUSCULAR | Status: AC
  Filled 2020-05-01: qty 1

## 2020-05-01 MED ORDER — ROCURONIUM BROMIDE 10 MG/ML (PF) SYRINGE
PREFILLED_SYRINGE | INTRAVENOUS | Status: AC
  Filled 2020-05-01: qty 10

## 2020-05-01 MED ORDER — PHENYLEPHRINE 40 MCG/ML (10ML) SYRINGE FOR IV PUSH (FOR BLOOD PRESSURE SUPPORT)
PREFILLED_SYRINGE | INTRAVENOUS | Status: DC | PRN
  Administered 2020-05-01 (×2): 80 ug via INTRAVENOUS
  Administered 2020-05-01: 40 ug via INTRAVENOUS
  Administered 2020-05-01 (×4): 80 ug via INTRAVENOUS
  Administered 2020-05-01 (×2): 120 ug via INTRAVENOUS
  Administered 2020-05-01 (×2): 80 ug via INTRAVENOUS
  Administered 2020-05-01 (×2): 40 ug via INTRAVENOUS

## 2020-05-01 MED ORDER — VANCOMYCIN HCL IN DEXTROSE 1-5 GM/200ML-% IV SOLN
1000.0000 mg | Freq: Once | INTRAVENOUS | Status: AC
  Administered 2020-05-01: 1000 mg via INTRAVENOUS
  Filled 2020-05-01: qty 200

## 2020-05-01 MED ORDER — LACTATED RINGERS IV SOLN: INTRAVENOUS | Status: DC

## 2020-05-01 MED ORDER — LABETALOL HCL 5 MG/ML IV SOLN: 10.0000 mg | INTRAVENOUS | Status: DC | PRN

## 2020-05-01 MED ORDER — TRAMADOL HCL 50 MG PO TABS
50.0000 mg | ORAL_TABLET | ORAL | Status: DC | PRN
  Administered 2020-05-02 – 2020-05-05 (×8): 100 mg via ORAL
  Filled 2020-05-01 (×8): qty 2

## 2020-05-01 MED ORDER — HEMOSTATIC AGENTS (NO CHARGE) OPTIME
TOPICAL | Status: DC | PRN
  Administered 2020-05-01 (×3): 1 via TOPICAL

## 2020-05-01 MED ORDER — LACTATED RINGERS IV SOLN: INTRAVENOUS | Status: DC | PRN

## 2020-05-01 MED ORDER — PROPOFOL 10 MG/ML IV BOLUS
INTRAVENOUS | Status: DC | PRN
  Administered 2020-05-01 (×2): 20 mg via INTRAVENOUS
  Administered 2020-05-01: 50 mg via INTRAVENOUS

## 2020-05-01 MED ORDER — EPHEDRINE 5 MG/ML INJ
INTRAVENOUS | Status: AC
  Filled 2020-05-01: qty 10

## 2020-05-01 MED ORDER — ONDANSETRON HCL 4 MG/2ML IJ SOLN
4.0000 mg | Freq: Four times a day (QID) | INTRAMUSCULAR | Status: DC | PRN
  Administered 2020-05-01 – 2020-05-02 (×2): 4 mg via INTRAVENOUS
  Filled 2020-05-01 (×2): qty 2

## 2020-05-01 MED ORDER — SODIUM CHLORIDE 0.9 % IV SOLN: INTRAVENOUS | Status: DC | PRN

## 2020-05-01 MED ORDER — PHENYLEPHRINE HCL-NACL 20-0.9 MG/250ML-% IV SOLN
0.0000 ug/min | INTRAVENOUS | Status: DC
  Administered 2020-05-01 – 2020-05-02 (×2): 50 ug/min via INTRAVENOUS
  Administered 2020-05-02: 15 ug/min via INTRAVENOUS
  Administered 2020-05-02: 50 ug/min via INTRAVENOUS
  Filled 2020-05-01 (×4): qty 250

## 2020-05-01 MED ORDER — MIDAZOLAM HCL 5 MG/5ML IJ SOLN
INTRAMUSCULAR | Status: DC | PRN
  Administered 2020-05-01 (×2): 2 mg via INTRAVENOUS
  Administered 2020-05-01: 3 mg via INTRAVENOUS
  Administered 2020-05-01: 1 mg via INTRAVENOUS
  Administered 2020-05-01: 2 mg via INTRAVENOUS
  Administered 2020-05-01 (×2): 1 mg via INTRAVENOUS

## 2020-05-01 MED ORDER — LIDOCAINE 2% (20 MG/ML) 5 ML SYRINGE
INTRAMUSCULAR | Status: DC | PRN
  Administered 2020-05-01: 100 mg via INTRAVENOUS

## 2020-05-01 MED ORDER — ASPIRIN EC 325 MG PO TBEC
325.0000 mg | DELAYED_RELEASE_TABLET | Freq: Every day | ORAL | Status: DC
  Administered 2020-05-02: 325 mg via ORAL
  Filled 2020-05-01: qty 1

## 2020-05-01 MED ORDER — PLASMA-LYTE A IV SOLN: INTRAVENOUS | Status: DC

## 2020-05-01 MED ORDER — INSULIN REGULAR(HUMAN) IN NACL 100-0.9 UT/100ML-% IV SOLN: INTRAVENOUS | Status: DC

## 2020-05-01 MED ORDER — VECURONIUM BROMIDE 10 MG IV SOLR
INTRAVENOUS | Status: AC
  Filled 2020-05-01: qty 10

## 2020-05-01 MED ORDER — PHENYLEPHRINE 40 MCG/ML (10ML) SYRINGE FOR IV PUSH (FOR BLOOD PRESSURE SUPPORT)
PREFILLED_SYRINGE | INTRAVENOUS | Status: AC
  Filled 2020-05-01: qty 20

## 2020-05-01 MED ORDER — SODIUM CHLORIDE 0.9 % IV SOLN: 250.0000 mL | INTRAVENOUS | Status: DC

## 2020-05-01 MED ORDER — LIDOCAINE 2% (20 MG/ML) 5 ML SYRINGE
INTRAMUSCULAR | Status: AC
  Filled 2020-05-01: qty 5

## 2020-05-01 MED ORDER — SODIUM CHLORIDE 0.45 % IV SOLN: INTRAVENOUS | Status: DC | PRN

## 2020-05-01 MED ORDER — METOPROLOL TARTRATE 12.5 MG HALF TABLET
12.5000 mg | ORAL_TABLET | Freq: Once | ORAL | Status: AC
  Administered 2020-05-01: 12.5 mg via ORAL
  Filled 2020-05-01: qty 1

## 2020-05-01 MED ORDER — SODIUM CHLORIDE 0.9 % IV SOLN: INTRAVENOUS | Status: DC

## 2020-05-01 MED ORDER — CALCIUM CHLORIDE 10 % IV SOLN
INTRAVENOUS | Status: DC | PRN
  Administered 2020-05-01 (×4): 100 mg via INTRAVENOUS

## 2020-05-01 MED ORDER — DEXTROSE 50 % IV SOLN: 0.0000 mL | INTRAVENOUS | Status: DC | PRN

## 2020-05-01 MED ORDER — ACETAMINOPHEN 160 MG/5ML PO SOLN: 1000.0000 mg | Freq: Four times a day (QID) | ORAL | Status: DC

## 2020-05-01 MED ORDER — ACETAMINOPHEN 160 MG/5ML PO SOLN: 650.0000 mg | Freq: Once | ORAL | Status: AC

## 2020-05-01 MED ORDER — MILRINONE LACTATE IN DEXTROSE 20-5 MG/100ML-% IV SOLN: 0.2500 ug/kg/min | INTRAVENOUS | Status: AC

## 2020-05-01 MED ORDER — METOPROLOL TARTRATE 25 MG/10 ML ORAL SUSPENSION: 12.5000 mg | Freq: Two times a day (BID) | ORAL | Status: DC

## 2020-05-01 MED ORDER — LACTATED RINGERS IV SOLN: 500.0000 mL | Freq: Once | INTRAVENOUS | Status: DC | PRN

## 2020-05-01 MED ORDER — ACETAMINOPHEN 650 MG RE SUPP
650.0000 mg | Freq: Once | RECTAL | Status: AC
  Administered 2020-05-01: 650 mg via RECTAL

## 2020-05-01 MED ORDER — POTASSIUM CHLORIDE 10 MEQ/50ML IV SOLN
10.0000 meq | INTRAVENOUS | Status: AC
  Administered 2020-05-01 (×3): 10 meq via INTRAVENOUS

## 2020-05-01 MED ORDER — METOPROLOL TARTRATE 12.5 MG HALF TABLET
12.5000 mg | ORAL_TABLET | Freq: Two times a day (BID) | ORAL | Status: DC
  Filled 2020-05-01 (×2): qty 1

## 2020-05-01 MED ORDER — PANTOPRAZOLE SODIUM 40 MG PO TBEC
40.0000 mg | DELAYED_RELEASE_TABLET | Freq: Every day | ORAL | Status: DC
  Administered 2020-05-03 – 2020-05-05 (×3): 40 mg via ORAL
  Filled 2020-05-01 (×3): qty 1

## 2020-05-01 MED ORDER — ALBUMIN HUMAN 5 % IV SOLN
250.0000 mL | INTRAVENOUS | Status: DC | PRN
  Administered 2020-05-01 (×3): 12.5 g via INTRAVENOUS
  Filled 2020-05-01 (×2): qty 250

## 2020-05-01 MED ORDER — SODIUM CHLORIDE 0.9% FLUSH: 3.0000 mL | INTRAVENOUS | Status: DC | PRN

## 2020-05-01 MED ORDER — SODIUM CHLORIDE (PF) 0.9 % IJ SOLN
OROMUCOSAL | Status: DC | PRN
  Administered 2020-05-01 (×3): 4 mL via TOPICAL

## 2020-05-01 MED ORDER — NITROGLYCERIN IN D5W 200-5 MCG/ML-% IV SOLN: 0.0000 ug/min | INTRAVENOUS | Status: DC

## 2020-05-01 MED ORDER — CHLORHEXIDINE GLUCONATE 4 % EX LIQD: 30.0000 mL | CUTANEOUS | Status: DC

## 2020-05-01 MED ORDER — MORPHINE SULFATE (PF) 2 MG/ML IV SOLN
1.0000 mg | INTRAVENOUS | Status: DC | PRN
  Administered 2020-05-01 – 2020-05-02 (×5): 2 mg via INTRAVENOUS
  Filled 2020-05-01 (×5): qty 1

## 2020-05-01 MED ORDER — CHLORHEXIDINE GLUCONATE 0.12 % MT SOLN
15.0000 mL | OROMUCOSAL | Status: AC
  Administered 2020-05-01: 15 mL via OROMUCOSAL

## 2020-05-01 MED ORDER — MIDAZOLAM HCL 2 MG/2ML IJ SOLN: 2.0000 mg | INTRAMUSCULAR | Status: DC | PRN

## 2020-05-01 MED ORDER — SODIUM CHLORIDE 0.9 % IV SOLN
20.0000 ug | Freq: Once | INTRAVENOUS | Status: AC
  Administered 2020-05-01: 20 ug via INTRAVENOUS
  Filled 2020-05-01: qty 5

## 2020-05-01 MED ORDER — ALPRAZOLAM 0.25 MG PO TABS: 0.2500 mg | ORAL_TABLET | Freq: Two times a day (BID) | ORAL | Status: DC | PRN

## 2020-05-01 MED ORDER — MIDAZOLAM HCL 2 MG/2ML IJ SOLN
INTRAMUSCULAR | Status: AC
  Filled 2020-05-01: qty 2

## 2020-05-01 MED ORDER — HEPARIN SODIUM (PORCINE) 1000 UNIT/ML IJ SOLN
INTRAMUSCULAR | Status: DC | PRN
  Administered 2020-05-01: 2000 [IU] via INTRAVENOUS
  Administered 2020-05-01: 21000 [IU] via INTRAVENOUS
  Administered 2020-05-01: 2000 [IU] via INTRAVENOUS

## 2020-05-01 MED ORDER — METOPROLOL TARTRATE 5 MG/5ML IV SOLN: 2.5000 mg | INTRAVENOUS | Status: DC | PRN

## 2020-05-01 MED ORDER — ACETAMINOPHEN 500 MG PO TABS
1000.0000 mg | ORAL_TABLET | Freq: Four times a day (QID) | ORAL | Status: DC
  Administered 2020-05-02 – 2020-05-03 (×7): 1000 mg via ORAL
  Filled 2020-05-01 (×10): qty 2

## 2020-05-01 MED ORDER — STERILE WATER FOR IRRIGATION IR SOLN
Status: DC | PRN
  Administered 2020-05-01: 2000 mL

## 2020-05-01 MED ORDER — ASPIRIN 81 MG PO CHEW: 324.0000 mg | CHEWABLE_TABLET | Freq: Every day | ORAL | Status: DC

## 2020-05-01 MED ORDER — MILRINONE LACTATE IN DEXTROSE 20-5 MG/100ML-% IV SOLN
0.2500 ug/kg/min | INTRAVENOUS | Status: DC
  Administered 2020-05-02: 0.25 ug/kg/min via INTRAVENOUS
  Filled 2020-05-01: qty 100

## 2020-05-01 MED ORDER — BISACODYL 10 MG RE SUPP: 10.0000 mg | Freq: Every day | RECTAL | Status: DC

## 2020-05-01 MED ORDER — SODIUM CHLORIDE 0.9% FLUSH
10.0000 mL | Freq: Two times a day (BID) | INTRAVENOUS | Status: DC
  Administered 2020-05-01 – 2020-05-04 (×6): 10 mL

## 2020-05-01 MED ORDER — MILRINONE LACTATE IN DEXTROSE 20-5 MG/100ML-% IV SOLN: 0.2500 ug/kg/min | INTRAVENOUS | Status: DC

## 2020-05-01 MED ORDER — CHLORHEXIDINE GLUCONATE 0.12 % MT SOLN
15.0000 mL | Freq: Once | OROMUCOSAL | Status: AC
  Administered 2020-05-01: 15 mL via OROMUCOSAL
  Filled 2020-05-01: qty 15

## 2020-05-01 MED ORDER — DEXMEDETOMIDINE HCL IN NACL 400 MCG/100ML IV SOLN
0.0000 ug/kg/h | INTRAVENOUS | Status: DC
  Administered 2020-05-01: 0.2 ug/kg/h via INTRAVENOUS
  Filled 2020-05-01: qty 100

## 2020-05-01 MED ORDER — ORAL CARE MOUTH RINSE
15.0000 mL | Freq: Two times a day (BID) | OROMUCOSAL | Status: DC
  Administered 2020-05-02 – 2020-05-07 (×6): 15 mL via OROMUCOSAL

## 2020-05-01 MED ORDER — CHLORHEXIDINE GLUCONATE CLOTH 2 % EX PADS
6.0000 | MEDICATED_PAD | Freq: Every day | CUTANEOUS | Status: DC
  Administered 2020-05-01 – 2020-05-04 (×4): 6 via TOPICAL

## 2020-05-01 MED ORDER — ALBUMIN HUMAN 5 % IV SOLN: INTRAVENOUS | Status: DC | PRN

## 2020-05-01 MED ORDER — FENTANYL CITRATE (PF) 250 MCG/5ML IJ SOLN
INTRAMUSCULAR | Status: DC | PRN
  Administered 2020-05-01: 150 ug via INTRAVENOUS
  Administered 2020-05-01: 200 ug via INTRAVENOUS
  Administered 2020-05-01: 50 ug via INTRAVENOUS
  Administered 2020-05-01: 100 ug via INTRAVENOUS
  Administered 2020-05-01: 750 ug via INTRAVENOUS

## 2020-05-01 MED ORDER — SODIUM CHLORIDE 0.9% IV SOLUTION: Freq: Once | INTRAVENOUS | Status: AC

## 2020-05-01 MED ORDER — BISACODYL 5 MG PO TBEC
10.0000 mg | DELAYED_RELEASE_TABLET | Freq: Every day | ORAL | Status: DC
  Administered 2020-05-02 – 2020-05-05 (×4): 10 mg via ORAL
  Filled 2020-05-01 (×4): qty 2

## 2020-05-01 MED ORDER — PHENYLEPHRINE 40 MCG/ML (10ML) SYRINGE FOR IV PUSH (FOR BLOOD PRESSURE SUPPORT)
PREFILLED_SYRINGE | INTRAVENOUS | Status: AC
  Filled 2020-05-01: qty 10

## 2020-05-01 MED ORDER — LEVOFLOXACIN IN D5W 750 MG/150ML IV SOLN
750.0000 mg | INTRAVENOUS | Status: AC
  Administered 2020-05-02: 750 mg via INTRAVENOUS
  Filled 2020-05-01: qty 150

## 2020-05-01 MED ORDER — PROTAMINE SULFATE 10 MG/ML IV SOLN
INTRAVENOUS | Status: AC
  Filled 2020-05-01: qty 25

## 2020-05-01 MED ORDER — SODIUM CHLORIDE 0.9% FLUSH: 10.0000 mL | INTRAVENOUS | Status: DC | PRN

## 2020-05-01 MED ORDER — ROCURONIUM BROMIDE 10 MG/ML (PF) SYRINGE
PREFILLED_SYRINGE | INTRAVENOUS | Status: DC | PRN
  Administered 2020-05-01: 100 mg via INTRAVENOUS
  Administered 2020-05-01: 20 mg via INTRAVENOUS
  Administered 2020-05-01: 50 mg via INTRAVENOUS
  Administered 2020-05-01: 60 mg via INTRAVENOUS
  Administered 2020-05-01: 40 mg via INTRAVENOUS

## 2020-05-01 MED ORDER — MIDAZOLAM HCL (PF) 10 MG/2ML IJ SOLN
INTRAMUSCULAR | Status: AC
  Filled 2020-05-01: qty 2

## 2020-05-01 MED ORDER — DOCUSATE SODIUM 100 MG PO CAPS
200.0000 mg | ORAL_CAPSULE | Freq: Every day | ORAL | Status: DC
  Administered 2020-05-02 – 2020-05-05 (×4): 200 mg via ORAL
  Filled 2020-05-01 (×4): qty 2

## 2020-05-01 MED ORDER — PROTAMINE SULFATE 10 MG/ML IV SOLN
INTRAVENOUS | Status: DC | PRN
  Administered 2020-05-01: 20 mg via INTRAVENOUS
  Administered 2020-05-01: 250 mg via INTRAVENOUS

## 2020-05-01 MED ORDER — PLASMA-LYTE 148 IV SOLN
INTRAVENOUS | Status: DC | PRN
  Administered 2020-05-01: 500 mL via INTRAVASCULAR

## 2020-05-01 MED ORDER — PROPOFOL 10 MG/ML IV BOLUS
INTRAVENOUS | Status: AC
  Filled 2020-05-01: qty 20

## 2020-05-01 MED ORDER — 0.9 % SODIUM CHLORIDE (POUR BTL) OPTIME
TOPICAL | Status: DC | PRN
  Administered 2020-05-01: 5000 mL

## 2020-05-01 SURGICAL SUPPLY — 126 items
ADAPTER CARDIO PERF ANTE/RETRO (ADAPTER) ×5 IMPLANT
ADH SKN CLS APL DERMABOND .7 (GAUZE/BANDAGES/DRESSINGS) ×3
ADH SRG 12 PREFL SYR 3 SPRDR (MISCELLANEOUS)
ADPR PRFSN 84XANTGRD RTRGD (ADAPTER) ×3
AGENT HMST KT MTR STRL THRMB (HEMOSTASIS) ×3
APPLIER CLIP 9.375 SM OPEN (CLIP) ×5
APR CLP SM 9.3 20 MLT OPN (CLIP) ×3
BAG DECANTER FOR FLEXI CONT (MISCELLANEOUS) ×5 IMPLANT
BLADE CLIPPER SURG (BLADE) IMPLANT
BLADE STERNUM SYSTEM 6 (BLADE) ×5 IMPLANT
BLADE SURG 11 STRL SS (BLADE) ×5 IMPLANT
BLADE SURG 12 STRL SS (BLADE) ×5 IMPLANT
BLADE SURG 15 STRL LF DISP TIS (BLADE) ×6 IMPLANT
BLADE SURG 15 STRL SS (BLADE) ×10
BNDG ELASTIC 4X5.8 VLCR STR LF (GAUZE/BANDAGES/DRESSINGS) ×5 IMPLANT
BNDG ELASTIC 6X5.8 VLCR STR LF (GAUZE/BANDAGES/DRESSINGS) ×5 IMPLANT
BNDG GAUZE ELAST 4 BULKY (GAUZE/BANDAGES/DRESSINGS) ×5 IMPLANT
CANISTER SUCT 3000ML PPV (MISCELLANEOUS) ×5 IMPLANT
CANNULA GUNDRY RCSP 15FR (MISCELLANEOUS) ×5 IMPLANT
CATH CPB KIT VANTRIGT (MISCELLANEOUS) ×5 IMPLANT
CATH HEART VENT LEFT (CATHETERS) ×3 IMPLANT
CATH RETROPLEGIA CORONARY 14FR (CATHETERS) IMPLANT
CATH ROBINSON RED A/P 18FR (CATHETERS) ×15 IMPLANT
CATH THORACIC 28FR RT ANG (CATHETERS) ×10 IMPLANT
CLIP APPLIE 9.375 SM OPEN (CLIP) ×3 IMPLANT
CLIP FOGARTY SPRING 6M (CLIP) IMPLANT
CLIP RETRACTION 3.0MM CORONARY (MISCELLANEOUS) ×5 IMPLANT
CLIP VESOCCLUDE SM WIDE 24/CT (CLIP) ×10 IMPLANT
CNTNR URN SCR LID CUP LEK RST (MISCELLANEOUS) ×3 IMPLANT
CONT SPEC 4OZ STRL OR WHT (MISCELLANEOUS) ×5
COVER SURGICAL LIGHT HANDLE (MISCELLANEOUS) ×5 IMPLANT
DERMABOND ADVANCED (GAUZE/BANDAGES/DRESSINGS) ×2
DERMABOND ADVANCED .7 DNX12 (GAUZE/BANDAGES/DRESSINGS) ×3 IMPLANT
DRAIN CHANNEL 32F RND 10.7 FF (WOUND CARE) ×5 IMPLANT
DRAPE CARDIOVASCULAR INCISE (DRAPES) ×5
DRAPE SLUSH/WARMER DISC (DRAPES) ×5 IMPLANT
DRAPE SRG 135X102X78XABS (DRAPES) ×3 IMPLANT
DRSG AQUACEL AG ADV 3.5X10 (GAUZE/BANDAGES/DRESSINGS) ×5 IMPLANT
DRSG AQUACEL AG ADV 3.5X14 (GAUZE/BANDAGES/DRESSINGS) ×5 IMPLANT
ELECT BLADE 4.0 EZ CLEAN MEGAD (MISCELLANEOUS) ×5
ELECT BLADE 6.5 EXT (BLADE) ×5 IMPLANT
ELECT CAUTERY BLADE 6.4 (BLADE) ×5 IMPLANT
ELECT REM PT RETURN 9FT ADLT (ELECTROSURGICAL) ×10
ELECTRODE BLDE 4.0 EZ CLN MEGD (MISCELLANEOUS) ×3 IMPLANT
ELECTRODE REM PT RTRN 9FT ADLT (ELECTROSURGICAL) ×6 IMPLANT
FELT TEFLON 1X6 (MISCELLANEOUS) ×10 IMPLANT
GAUZE SPONGE 4X4 12PLY STRL (GAUZE/BANDAGES/DRESSINGS) ×10 IMPLANT
GAUZE SPONGE 4X4 12PLY STRL LF (GAUZE/BANDAGES/DRESSINGS) ×10 IMPLANT
GLOVE BIO SURGEON STRL SZ 6.5 (GLOVE) ×28 IMPLANT
GLOVE BIO SURGEON STRL SZ7.5 (GLOVE) ×15 IMPLANT
GLOVE BIO SURGEONS STRL SZ 6.5 (GLOVE) ×7
GLOVE BIOGEL PI IND STRL 9 (GLOVE) ×6 IMPLANT
GLOVE BIOGEL PI INDICATOR 9 (GLOVE) ×4
GLOVE SURG UNDER POLY LF SZ6 (GLOVE) ×5 IMPLANT
GLOVE SURG UNDER POLY LF SZ7 (GLOVE) ×5 IMPLANT
GOWN STRL REUS W/ TWL LRG LVL3 (GOWN DISPOSABLE) ×30 IMPLANT
GOWN STRL REUS W/TWL LRG LVL3 (GOWN DISPOSABLE) ×50
HEMOSTAT POWDER SURGIFOAM 1G (HEMOSTASIS) ×15 IMPLANT
HEMOSTAT SURGICEL 2X14 (HEMOSTASIS) ×5 IMPLANT
INSERT FOGARTY XLG (MISCELLANEOUS) IMPLANT
KIT BASIN OR (CUSTOM PROCEDURE TRAY) ×5 IMPLANT
KIT SUCTION CATH 14FR (SUCTIONS) ×5 IMPLANT
KIT TURNOVER KIT B (KITS) ×5 IMPLANT
KIT VASOVIEW HEMOPRO 2 VH 4000 (KITS) ×5 IMPLANT
LEAD PACING MYOCARDI (MISCELLANEOUS) ×5 IMPLANT
LINE VENT (MISCELLANEOUS) ×5 IMPLANT
MARKER GRAFT CORONARY BYPASS (MISCELLANEOUS) ×15 IMPLANT
NS IRRIG 1000ML POUR BTL (IV SOLUTION) ×25 IMPLANT
PACK E OPEN HEART (SUTURE) ×5 IMPLANT
PACK OPEN HEART (CUSTOM PROCEDURE TRAY) ×5 IMPLANT
PAD ARMBOARD 7.5X6 YLW CONV (MISCELLANEOUS) ×10 IMPLANT
PAD ELECT DEFIB RADIOL ZOLL (MISCELLANEOUS) ×5 IMPLANT
PENCIL BUTTON HOLSTER BLD 10FT (ELECTRODE) ×5 IMPLANT
POSITIONER HEAD DONUT 9IN (MISCELLANEOUS) ×5 IMPLANT
POWDER SURGICEL 3.0 GRAM (HEMOSTASIS) ×5 IMPLANT
PUNCH AORTIC ROTATE 4.0MM (MISCELLANEOUS) IMPLANT
PUNCH AORTIC ROTATE 4.5MM 8IN (MISCELLANEOUS) ×5 IMPLANT
PUNCH AORTIC ROTATE 5MM 8IN (MISCELLANEOUS) IMPLANT
SET CARDIOPLEGIA MPS 5001102 (MISCELLANEOUS) ×5 IMPLANT
SLEEVE SURGEON STRL (DRAPES) ×5 IMPLANT
SPONGE LAP 18X18 RF (DISPOSABLE) ×15 IMPLANT
SPONGE LAP 4X18 RFD (DISPOSABLE) ×5 IMPLANT
SUPPORT HEART JANKE-BARRON (MISCELLANEOUS) ×5 IMPLANT
SURGIFLO W/THROMBIN 8M KIT (HEMOSTASIS) ×5 IMPLANT
SUT BONE WAX W31G (SUTURE) ×5 IMPLANT
SUT EB EXC GRN/WHT 2-0 V-5 (SUTURE) ×10 IMPLANT
SUT ETHIBOND 2 0 SH (SUTURE) ×5
SUT ETHIBOND 2 0 SH 36X2 (SUTURE) ×3 IMPLANT
SUT MNCRL AB 4-0 PS2 18 (SUTURE) ×5 IMPLANT
SUT PROLENE 3 0 RB 1 (SUTURE) ×5 IMPLANT
SUT PROLENE 3 0 SH 1 (SUTURE) IMPLANT
SUT PROLENE 3 0 SH DA (SUTURE) IMPLANT
SUT PROLENE 3 0 SH1 36 (SUTURE) IMPLANT
SUT PROLENE 4 0 RB 1 (SUTURE) ×15
SUT PROLENE 4 0 SH DA (SUTURE) ×5 IMPLANT
SUT PROLENE 4-0 RB1 .5 CRCL 36 (SUTURE) ×9 IMPLANT
SUT PROLENE 5 0 C 1 36 (SUTURE) IMPLANT
SUT PROLENE 6 0 C 1 30 (SUTURE) IMPLANT
SUT PROLENE 6 0 CC (SUTURE) ×15 IMPLANT
SUT PROLENE 8 0 BV175 6 (SUTURE) IMPLANT
SUT PROLENE BLUE 7 0 (SUTURE) ×20 IMPLANT
SUT SILK  1 MH (SUTURE) ×5
SUT SILK 1 MH (SUTURE) ×3 IMPLANT
SUT SILK 2 0 SH CR/8 (SUTURE) IMPLANT
SUT SILK 3 0 SH CR/8 (SUTURE) IMPLANT
SUT STEEL 6MS V (SUTURE) ×5 IMPLANT
SUT STEEL STERNAL CCS#1 18IN (SUTURE) ×5 IMPLANT
SUT STEEL SZ 6 DBL 3X14 BALL (SUTURE) ×10 IMPLANT
SUT VIC AB 1 CTX 36 (SUTURE) ×15
SUT VIC AB 1 CTX36XBRD ANBCTR (SUTURE) ×9 IMPLANT
SUT VIC AB 2-0 CT1 27 (SUTURE) ×5
SUT VIC AB 2-0 CT1 TAPERPNT 27 (SUTURE) ×3 IMPLANT
SUT VIC AB 2-0 CTX 27 (SUTURE) IMPLANT
SUT VIC AB 3-0 X1 27 (SUTURE) IMPLANT
SYR 10ML KIT SKIN ADHESIVE (MISCELLANEOUS) IMPLANT
SYSTEM SAHARA CHEST DRAIN ATS (WOUND CARE) ×5 IMPLANT
TAPE CLOTH SURG 4X10 WHT LF (GAUZE/BANDAGES/DRESSINGS) ×5 IMPLANT
TAPE PAPER 2X10 WHT MICROPORE (GAUZE/BANDAGES/DRESSINGS) ×5 IMPLANT
TOWEL GREEN STERILE (TOWEL DISPOSABLE) ×5 IMPLANT
TOWEL GREEN STERILE FF (TOWEL DISPOSABLE) ×5 IMPLANT
TRAY FOLEY SLVR 14FR TEMP STAT (SET/KITS/TRAYS/PACK) ×5 IMPLANT
TRAY FOLEY SLVR 16FR TEMP STAT (SET/KITS/TRAYS/PACK) IMPLANT
TUBING LAP HI FLOW INSUFFLATIO (TUBING) ×5 IMPLANT
UNDERPAD 30X36 HEAVY ABSORB (UNDERPADS AND DIAPERS) ×5 IMPLANT
VENT LEFT HEART 12002 (CATHETERS) ×5
WATER STERILE IRR 1000ML POUR (IV SOLUTION) ×10 IMPLANT

## 2020-05-01 NOTE — OR Nursing (Signed)
Due to patient stating he had an allergic reaction to iodine as a child, ( iodine caused a rash), patch test performed with betadine to right inner forearm.  Betadine was left in place for approxamately 10 minutes without any reaction. Site wiped clean with alcohol with no skin irritation present.  Instructed to notify CRNA with him to call OR Room if any latent reaction occurs.  Patient verbalized understanding.

## 2020-05-01 NOTE — Anesthesia Procedure Notes (Addendum)
Procedure Name: Intubation Date/Time: 05/01/2020 9:04 AM Performed by: Barrington Ellison, CRNA Pre-anesthesia Checklist: Patient identified, Emergency Drugs available, Suction available and Patient being monitored Patient Re-evaluated:Patient Re-evaluated prior to induction Oxygen Delivery Method: Circle System Utilized Preoxygenation: Pre-oxygenation with 100% oxygen Induction Type: IV induction Ventilation: Mask ventilation without difficulty Laryngoscope Size: Mac and 4 Grade View: Grade I Tube type: Oral Tube size: 8.0 mm Number of attempts: 2 (DL x 1 with Miller 2, switched to MAC 4) Airway Equipment and Method: Stylet Placement Confirmation: ETT inserted through vocal cords under direct vision,  positive ETCO2 and breath sounds checked- equal and bilateral Secured at: 23 cm Tube secured with: Tape Dental Injury: Teeth and Oropharynx as per pre-operative assessment  Comments: Performed by Vernelle Emerald, SRNA

## 2020-05-01 NOTE — Transfer of Care (Signed)
Immediate Anesthesia Transfer of Care Note  Patient: Corban Kistler Foland  Procedure(s) Performed: CORONARY ARTERY BYPASS GRAFTING (CABG) X 2 ON CARDIOPULMONARY BYPASS. LIMA TO LAD, SVG TO DIAG (N/A Chest) TRANSESOPHAGEAL ECHOCARDIOGRAM (TEE) (N/A ) ENDOVEIN HARVEST OF GREATER SAPHENOUS VEIN (Right )  Patient Location: ICU  Anesthesia Type:General  Level of Consciousness: Patient remains intubated per anesthesia plan  Airway & Oxygen Therapy: Patient remains intubated per anesthesia plan and Patient placed on Ventilator (see vital sign flow sheet for setting)  Post-op Assessment: Post -op Vital signs reviewed and stable  Post vital signs: Reviewed and stable  Last Vitals:  Vitals Value Taken Time  BP    Temp 35.4 C 05/01/20 1523  Pulse 97 05/01/20 1523  Resp 12 05/01/20 1523  SpO2 100 % 05/01/20 1523  Vitals shown include unvalidated device data.  Last Pain:  Vitals:   05/01/20 0609  TempSrc:   PainSc: 7       Patients Stated Pain Goal: 3 (84/66/59 [id number])  Complications: No complications documented.

## 2020-05-01 NOTE — Brief Op Note (Signed)
05/01/2020  12:51 PM  PATIENT:  Philip Winters  59 y.o. male  PRE-OPERATIVE DIAGNOSIS:  1. CAD 2. Mild to Moderate Aortic Stenosis  POST-OPERATIVE DIAGNOSIS:  CAD BICUSPID AORTIC VALVE  PROCEDURE: TRANSESOPHAGEAL ECHOCARDIOGRAM (TEE), CORONARY ARTERY BYPASS GRAFTING (CABG) X 2 (LIMA to LAD and SVG to DIAGONAL,  ENDOVEIN HARVEST OF RIGHT THIGH GREATER SAPHENOUS VEIN   EVH HARVEST TIME: 26 minutes; EVH PREP TIME: 10 minutes  SURGEON:  Surgeon(s) and Role:    Ivin Poot, MD - Primary  PHYSICIAN ASSISTANT: Lars Pinks PA-C  ASSISTANTS: Rodman Key RNFA   ANESTHESIA:   general  EBL:  Per anesthesia/perfusion record  DRAINS: Chest tubes placed in the mediastinal and pleural spaces  COUNTS CORRECT:  YES  DICTATION: .Dragon Dictation  PLAN OF CARE: Admit to inpatient   PATIENT DISPOSITION:  ICU - intubated and hemodynamically stable.   Delay start of Pharmacological VTE agent (>24hrs) due to surgical blood loss or risk of bleeding: yes  BASELINE WEIGHT: 71.2 kg

## 2020-05-01 NOTE — Hospital Course (Addendum)
HPI: This is a 59 year old hypertensive reformed smoker with history of dyslipidemia has recent symptoms of exertional angina.  He has known history of cardiac murmur from bicuspid aortic valve.   The patient recently underwent a left heart cath which demonstrated a high-grade proximal LAD stenosis prior to a diagonal branch with a coronary aneurysm.  The dominant circumflex system is open without disease.  The nondominant small and atretic RCA has diffuse disease.   Recent echocardiogram demonstrates calcified thickened bicuspid aortic valve with a calculated peak gradient of 30 mmHg.  Good LV function without significant LVH.  No significant mitral disease. A transvalvular gradient at cath measured a mean gradient of 9-10 mmHg.  He was seen by his family dentist and had dental hygiene and was cleared for aortic valve replacement if needed.  The patient is also had a CTA of the chest due to his bicuspid valve to assess his ascending aorta which is minimally dilated at 4.1 cm with normal morphology.  He does have a 1.5 cm infiltrative density in the superior segment of the left lower lobe which will need to be followed with further scanning.  Dr. Prescott Gum discussed the need for coronary artery bypass grafting surgery and possible aortic valve replacement surgery. Potential risks, benefits, and complications of the surgery were discussed with the patient and he agreed to proceed with surgery. Pre operative carotid duplex US showed no significant internal carotid artery stenosis bilaterally. Patient underwent a CABG x 2 on 05/01/2020.  Hospital Course: Patient was extubated the evening of surgery without difficulty. He did have post op blood loss anemia and required a transfusion. He was weaned off Milrinone and Neo Synephrine drips. [name], a line and foley were removed early in his post operative course. Chest tubes remained for a few days and once output decreased were removed on 12/17.He was later  started on Lopressor. He was volume overloaded and diuresed accordingly. He was weaned off the Insulin drip. His pre op HGA1C was 5.8. He was given nutrition information and instructed to follow up with his medical doctor for further surveillance.   He has continued to make progress maintaining stable vital signs with no fevers.  He does have some tachycardia which is chronic for him.  He has been evaluated for this and has been tried on beta-blockers in the past and is intolerant.  He is on Cardizem which we have uptitrated rates are acceptable.  Oxygen has been weaned and he maintains good saturations on room air.  Epicardial pacer wires have been discontinued.  He has a very minor acute blood loss anemia which is stable.  Chest x-ray reveals minor effusions and atelectasis which is improving.

## 2020-05-01 NOTE — Anesthesia Procedure Notes (Signed)
Arterial Line Insertion Start/End12/14/2021 6:55 AM, 05/01/2020 7:02 AM Performed by: Barrington Ellison, CRNA, CRNA  Preanesthetic checklist: patient identified, risks and benefits discussed and pre-op evaluation Lidocaine 1% used for infiltration and patient sedated Left, radial was placed Catheter size: 20 G Hand hygiene performed  and maximum sterile barriers used  Allen's test indicative of satisfactory collateral circulation Attempts: 1 Procedure performed without using ultrasound guided technique. Following insertion, dressing applied and Biopatch. Post procedure assessment: normal  Patient tolerated the procedure well with no immediate complications. Additional procedure comments: Performed by Vernelle Emerald, SRNA with direct CRNA supervision.

## 2020-05-01 NOTE — Anesthesia Postprocedure Evaluation (Signed)
Anesthesia Post Note  Patient: Lanice Schwab Enwright  Procedure(s) Performed: CORONARY ARTERY BYPASS GRAFTING (CABG) X 2 ON CARDIOPULMONARY BYPASS. LIMA TO LAD, SVG TO DIAG (N/A Chest) TRANSESOPHAGEAL ECHOCARDIOGRAM (TEE) (N/A ) ENDOVEIN HARVEST OF GREATER SAPHENOUS VEIN (Right )     Patient location during evaluation: SICU Anesthesia Type: General Level of consciousness: sedated Pain management: pain level controlled Vital Signs Assessment: post-procedure vital signs reviewed and stable Respiratory status: patient remains intubated per anesthesia plan Cardiovascular status: stable Postop Assessment: no apparent nausea or vomiting Anesthetic complications: no   No complications documented.  Last Vitals:  Vitals:   05/01/20 1715 05/01/20 1718  BP:    Pulse: (!) 103   Resp: 19   Temp: 36.9 C   SpO2: 100% 100%    Last Pain:  Vitals:   05/01/20 1600  TempSrc: Core  PainSc:                  Debarah Mccumbers DAVID

## 2020-05-01 NOTE — H&P (Signed)
PCP is Antony Contras, MD Referring Provider is No ref. provider found  No chief complaint on file.   HPI: Patient examined, images of recent coronary arteriogram and echocardiogram personally reviewed and discussed with patient and wife.  59 year old hypertensive reformed smoker with history of dyslipidemia has recent symptoms of exertional angina.  He has known history of cardiac murmur from bicuspid aortic valve.  The patient recently underwent a left heart cath which demonstrated a high-grade proximal LAD stenosis prior to a diagonal branch with a coronary aneurysm.  The dominant circumflex system is open without disease.  The nondominant small and atretic RCA has diffuse disease.  Recent echocardiogram demonstrates calcified thickened bicuspid aortic valve with a calculated peak gradient of 30 mmHg.  Good LV function without significant LVH.  No significant mitral disease. A transvalvular gradient at cath measured a mean gradient of 9-10 mmHg. Past Medical History:  Diagnosis Date  . Allergy   . Arthritis   . GERD (gastroesophageal reflux disease)    occ  . Heart murmur    baby  . High cholesterol   . Hypertension   . Pneumonia   . Right carotid bruit 06/10/2017  . Sciatica   . Seasonal allergies   . Sinus tachycardia   . Spinal stenosis     Past Surgical History:  Procedure Laterality Date  . CARPAL TUNNEL WITH CUBITAL TUNNEL Left 11/10/2013   Procedure: LEFT CARPAL TUNNEL RELEASE;  Surgeon: Cammie Sickle, MD;  Location: [place];  Service: Orthopedics;  Laterality: Left;  . COLONOSCOPY    . KNEE ARTHROSCOPY  [id number]   left and right  . LUMBAR LAMINECTOMY/DECOMPRESSION MICRODISCECTOMY Left 09/29/2017   Procedure: LEFT LUMBAR TWO - LUMBAR THREELAMINOTOMY/MICRODISCECTOMY;  Surgeon: Jovita Gamma, MD;  Location: [place];  Service: Neurosurgery;  Laterality: Left;  LEFT LUMBAR 2- LUMBAR 3 LAMINOTOMY/MICRODISCECTOMY  . NASAL SINUS SURGERY  05/2015  .  RIGHT/LEFT HEART CATH AND CORONARY ANGIOGRAPHY N/A 04/10/2020   Procedure: RIGHT/LEFT HEART CATH AND CORONARY ANGIOGRAPHY;  Surgeon: Troy Sine, MD;  Location: [facility];  Service: Cardiovascular;  Laterality: N/A;  . TOTAL KNEE ARTHROPLASTY Right 06/24/2016   Procedure: TOTAL KNEE ARTHROPLASTY;  Surgeon: Garald Balding, MD;  Location: [place];  Service: Orthopedics;  Laterality: Right;  . TRIGGER FINGER RELEASE Left 11/10/2013   Procedure: LEFT INDEX A-1 PULLEY RELEASE;  Surgeon: Cammie Sickle, MD;  Location: [place];  Service: Orthopedics;  Laterality: Left;  . ULNAR NERVE TRANSPOSITION Left 11/10/2013   Procedure: ULNAR NERVE TRANSPOSITION;  Surgeon: Cammie Sickle, MD;  Location: [place];  Service: Orthopedics;  Laterality: Left;    History reviewed. No pertinent family history.  Social History Social History   Tobacco Use  . Smoking status: Former Smoker    Packs/day: 1.00    Years: 37.00    Pack years: 37.00    Types: Cigarettes    Quit date: 05/04/2016    Years since quitting: 3.9  . Smokeless tobacco: Never Used  Vaping Use  . Vaping Use: Former  Substance Use Topics  . Alcohol use: Yes    Alcohol/week: 3.0 standard drinks    Types: 3 Glasses of wine per week    Comment: weekends only with dinner  . Drug use: No    Current Facility-Administered Medications  Medication Dose Route Frequency Provider Last Rate Last Admin  . chlorhexidine (HIBICLENS) 4 % liquid 2 application  30 mL Topical UD  Prescott Gum, Collier Salina, MD      . dexmedetomidine (PRECEDEX) 400 MCG/100ML (4 mcg/mL) infusion  0.1-0.7 mcg/kg/hr Intravenous To OR Prescott Gum, Collier Salina, MD      . EPINEPHrine (ADRENALIN) 4 mg in NS 250 mL (0.016 mg/mL) premix infusion  0-10 mcg/min Intravenous To OR Prescott Gum, Collier Salina, MD      . heparin 30,000 units/NS 1000 mL solution for CELLSAVER   Other To OR Prescott Gum, Collier Salina, MD      . heparin sodium (porcine) 2,500 Units, papaverine  30 mg in electrolyte-148 (PLASMALYTE-148) 500 mL irrigation   Irrigation To OR Prescott Gum, Collier Salina, MD      . insulin regular, human (MYXREDLIN) 100 units/ 100 mL infusion   Intravenous To OR Prescott Gum, Collier Salina, MD      . levofloxacin (LEVAQUIN) IVPB 500 mg  500 mg Intravenous To OR Prescott Gum, Collier Salina, MD      . magnesium sulfate (IV Push/IM) injection 40 mEq  40 mEq Other To OR Prescott Gum, Collier Salina, MD      . milrinone (PRIMACOR) 20 MG/100 ML (0.2 mg/mL) infusion  0.3 mcg/kg/min Intravenous To OR Prescott Gum, Collier Salina, MD      . nitroGLYCERIN 50 mg in dextrose 5 % 250 mL (0.2 mg/mL) infusion  2-200 mcg/min Intravenous To OR Prescott Gum, Collier Salina, MD      . norepinephrine (LEVOPHED) 4mg  in 241mL premix infusion  0-40 mcg/min Intravenous To OR Prescott Gum, Collier Salina, MD      . phenylephrine (NEOSYNEPHRINE) 20-0.9 MG/250ML-% infusion  30-200 mcg/min Intravenous To OR Prescott Gum, Collier Salina, MD      . potassium chloride injection 80 mEq  80 mEq Other To OR Prescott Gum, Collier Salina, MD      . tranexamic acid (CYKLOKAPRON) 2,500 mg in sodium chloride 0.9 % 250 mL (10 mg/mL) infusion  1.5 mg/kg/hr Intravenous To OR Prescott Gum, Collier Salina, MD      . tranexamic acid (CYKLOKAPRON) bolus via infusion - over 30 minutes 1,057.5 mg  15 mg/kg Intravenous To OR Prescott Gum, Collier Salina, MD      . tranexamic acid (CYKLOKAPRON) pump prime solution 141 mg  2 mg/kg Intracatheter To OR Prescott Gum, Collier Salina, MD      . vancomycin Alcus Dad) IVPB 1250 mg/250 mL  1,250 mg Intravenous To OR Ivin Poot, MD       Facility-Administered Medications Ordered in Other Encounters  Medication Dose Route Frequency Provider Last Rate Last Admin  . fentaNYL citrate (PF) (SUBLIMAZE) injection   Intravenous Anesthesia Intra-op Barrington Ellison, CRNA   50 mcg at 05/01/20 0745  . lactated ringers infusion   Intravenous Continuous PRN Barrington Ellison, CRNA   New Bag at 05/01/20 [phone]  . lactated ringers infusion   Intravenous Continuous PRN Barrington Ellison, CRNA   New Bag at 05/01/20 0645   . midazolam (VERSED) 5 MG/5ML injection   Intravenous Anesthesia Intra-op Barrington Ellison, CRNA   1 mg at 05/01/20 4270    Allergies  Allergen Reactions  . Mushroom Extract Complex Anaphylaxis  . Penicillins Shortness Of Breath    Childhood allergy.  Has patient had a PCN reaction causing immediate rash, facial/tongue/throat swelling, SOB or lightheadedness with hypotension: No Has patient had a PCN reaction causing severe rash involving mucus membranes or skin necrosis: No Has patient had a PCN reaction that required hospitalization No Has patient had a PCN reaction occurring within the last 10 years: No If all of the above answers are "NO", then may  proceed with C  . Atorvastatin     myalgia  . Chantix [Varenicline]     Other reaction(s): headaches  . Dilaudid [Hydromorphone] Nausea And Vomiting  . Pravastatin     myalgia  . Rosuvastatin     myalgia  . Bystolic [Nebivolol Hcl] Other (See Comments)    Extreme fatigue, not able to focus  . Codeine Nausea And Vomiting  . Iodine Rash  . Metoprolol Other (See Comments)    Made patient very fatigued, could not focus    Review of Systems   Patient is right-hand dominant Patient works as a Conservation officer, historic buildings Patient has 2 beers per day No history of thoracic trauma rib fracture or pneumothorax Patient had right total knee replacement 3 years ago without anesthetic problems or bleeding Patient has problems with severe anxiety and anxiety related tachycardia Patient cannot tolerate beta-blockers which cause him to be completely fatigued and nonfunctional  BP 133/83   Pulse 91   Temp 97.7 F (36.5 C) (Oral)   Resp 18   Ht 5\' 8"  (1.727 m)   Wt 71.2 kg   SpO2 97%   BMI 23.87 kg/m      Physical Exam  General: Normal size middle-aged well-nourished male no acute distress but anxious HEENT: Normocephalic pupils equal , dentition adequate Neck: Supple without JVD, adenopathy, or bruit.  Dental exam reveals no broken teeth  but suboptimal dental hygiene Chest: Clear to auscultation, symmetrical breath sounds, no rhonchi, no tenderness             or deformity Cardiovascular: Regular rate and rhythm, 2/6 systolic murmur, no gallop, peripheral pulses             palpable in all extremities Abdomen:  Soft, nontender, no palpable mass or organomegaly Extremities: Warm, well-perfused, no clubbing cyanosis edema or tenderness,              no venous stasis changes of the legs Rectal/GU: Deferred Neuro: Grossly non--focal and symmetrical throughout Skin: Clean and dry without rash or ulceration  Diagnostic Tests: Coronary angiogram and echocardiogram images reviewed as noted above  Impression: Significant proximal LAD stenosis involving a good sized first diagonal with a LV aneurysm Normal LV systolic function Bicuspid aortic valve with at least mild aortic stenosis Anxiety disorder History of spinal stenosis History of right knee replacement for arthritis  Plan: Would benefit from coronary bypass graft to the LAD and diagonal and probably aortic valve replacement for abnormal bicuspid aortic valve.   The patient has not seen a dentist in over 2 years because of knee replacement and subsequent problems with scheduling due to Covid  His coronary disease is stable and he has no symptoms at rest.  Patient will contact his dentist for dental hygiene and exam to be cleared for possible aortic valve replacement surgery.  After his dental visit he will return to the office and we will schedule a date for CABG with possible combined AVR.   Len Childs, MD [facility] [phone]  Pre Procedure note for inpatients:05-01-20   [name] has been scheduled for Procedure(s): CORONARY ARTERY BYPASS GRAFTING (CABG) (N/A) AORTIC VALVE REPLACEMENT (AVR) (N/A) TRANSESOPHAGEAL ECHOCARDIOGRAM (TEE) (N/A) today. The various methods of treatment have been discussed with the patient. After  consideration of the risks, benefits and treatment options the patient has consented to the planned procedure.   The patient has been seen and labs reviewed. There are no changes in the patient's  condition to prevent proceeding with the planned procedure today.He has been seen by his dentist, received dental cleaning and is cleared for possible heart valve surgery if needed.  Recent labs:  Lab Results  Component Value Date   WBC 6.5 04/30/2020   HGB 14.7 04/30/2020   HCT 41.0 04/30/2020   PLT 289 04/30/2020   GLUCOSE 124 (H) 04/30/2020   ALT 23 04/30/2020   AST 27 04/30/2020   NA 134 (L) 04/30/2020   K 4.3 04/30/2020   CL 102 04/30/2020   CREATININE 0.65 04/30/2020   BUN 5 (L) 04/30/2020   CO2 20 (L) 04/30/2020   INR 0.9 04/30/2020   HGBA1C 5.8 (H) 04/30/2020    Len Childs, MD 05/01/2020 8:02 AM

## 2020-05-01 NOTE — Procedures (Signed)
Extubation Procedure Note  Patient Details:   Name: Philip Winters DOB: [birth year] MRN: [id number]   Airway Documentation:    Vent end date: 05/01/20 Vent end time: 1822   Evaluation  O2 sats: stable throughout Complications: No apparent complications Patient did tolerate procedure well. Bilateral Breath Sounds: Clear   Yes  6l/min [place] NIF-60 FVC-780ml  Philip Winters 05/01/2020, 6:23 PM

## 2020-05-01 NOTE — Progress Notes (Signed)
  Echocardiogram Echocardiogram Transesophageal has been performed.  Philip Winters 05/01/2020, 9:02 AM

## 2020-05-01 NOTE — Progress Notes (Signed)
Pre extubation ABG with Hgb 6.5, Hct 19. Dr. Orvan Seen notified. Stated to continue to extubate and transfuse 1 unit prbc. Orders placed.  Joellen Jersey, RN

## 2020-05-01 NOTE — Anesthesia Preprocedure Evaluation (Signed)
Anesthesia Evaluation  Patient identified by MRN, date of birth, ID band Patient awake    Reviewed: Allergy & Precautions, NPO status , Patient's Chart, lab work & pertinent test results  Airway Mallampati: I  TM Distance: >3 FB Neck ROM: Full    Dental   Pulmonary former smoker,    Pulmonary exam normal        Cardiovascular hypertension, Pt. on medications + angina + CAD  Normal cardiovascular exam     Neuro/Psych    GI/Hepatic GERD  Controlled and Medicated,  Endo/Other    Renal/GU      Musculoskeletal   Abdominal   Peds  Hematology   Anesthesia Other Findings   Reproductive/Obstetrics                             Anesthesia Physical Anesthesia Plan  ASA: III  Anesthesia Plan: General   Post-op Pain Management:    Induction: Intravenous  PONV Risk Score and Plan: 2  Airway Management Planned: Oral ETT  Additional Equipment: Arterial line, PA Cath, TEE and Ultrasound Guidance Line Placement  Intra-op Plan:   Post-operative Plan: Post-operative intubation/ventilation  Informed Consent: I have reviewed the patients History and Physical, chart, labs and discussed the procedure including the risks, benefits and alternatives for the proposed anesthesia with the patient or authorized representative who has indicated his/her understanding and acceptance.       Plan Discussed with: CRNA and Surgeon  Anesthesia Plan Comments:         Anesthesia Quick Evaluation

## 2020-05-01 NOTE — Progress Notes (Signed)
TCTS evening Rounds  DOS s/p CABG Hemodyn stable Not bleeding Good UOP Hct 23  PE: BP 120/84 (BP Location: Right Arm)   Pulse 100   Temp (!) 96.8 F (36 C)   Resp 14   Ht 5\' 8"  (1.727 m)   Wt 71.2 kg   SpO2 100%   BMI 23.87 kg/m   Slowly waking Otherwise exam unremarkable   Intake/Output Summary (Last 24 hours) at 05/01/2020 1639 Last data filed at 05/01/2020 1637 Gross per 24 hour  Intake 5296 ml  Output 3585 ml  Net 1711 ml    A/P: continue early routine post operative care  Ronneisha Jett Z. Orvan Seen, [place]

## 2020-05-02 ENCOUNTER — Inpatient Hospital Stay (HOSPITAL_COMMUNITY): Payer: BC Managed Care – PPO

## 2020-05-02 ENCOUNTER — Encounter (HOSPITAL_COMMUNITY): Payer: Self-pay | Admitting: Cardiothoracic Surgery

## 2020-05-02 DIAGNOSIS — Z951 Presence of aortocoronary bypass graft: Secondary | ICD-10-CM

## 2020-05-02 LAB — GLUCOSE, CAPILLARY
Glucose-Capillary: 112 mg/dL — ABNORMAL HIGH (ref 70–99)
Glucose-Capillary: 118 mg/dL — ABNORMAL HIGH (ref 70–99)
Glucose-Capillary: 121 mg/dL — ABNORMAL HIGH (ref 70–99)
Glucose-Capillary: 121 mg/dL — ABNORMAL HIGH (ref 70–99)
Glucose-Capillary: 128 mg/dL — ABNORMAL HIGH (ref 70–99)
Glucose-Capillary: 129 mg/dL — ABNORMAL HIGH (ref 70–99)
Glucose-Capillary: 131 mg/dL — ABNORMAL HIGH (ref 70–99)
Glucose-Capillary: 132 mg/dL — ABNORMAL HIGH (ref 70–99)
Glucose-Capillary: 134 mg/dL — ABNORMAL HIGH (ref 70–99)
Glucose-Capillary: 135 mg/dL — ABNORMAL HIGH (ref 70–99)
Glucose-Capillary: 136 mg/dL — ABNORMAL HIGH (ref 70–99)
Glucose-Capillary: 137 mg/dL — ABNORMAL HIGH (ref 70–99)

## 2020-05-02 LAB — BPAM FFP
Blood Product Expiration Date: 202112142359
Blood Product Expiration Date: 202112142359
ISSUE DATE / TIME: 202112141306
ISSUE DATE / TIME: 202112141306
Unit Type and Rh: 600
Unit Type and Rh: 6200

## 2020-05-02 LAB — BASIC METABOLIC PANEL
Anion gap: 9 (ref 5–15)
Anion gap: 7 (ref 5–15)
BUN: 6 mg/dL (ref 6–20)
BUN: 9 mg/dL (ref 6–20)
CO2: 21 mmol/L — ABNORMAL LOW (ref 22–32)
CO2: 23 mmol/L (ref 22–32)
Calcium: 7.7 mg/dL — ABNORMAL LOW (ref 8.9–10.3)
Calcium: 8 mg/dL — ABNORMAL LOW (ref 8.9–10.3)
Chloride: 103 mmol/L (ref 98–111)
Chloride: 102 mmol/L (ref 98–111)
Creatinine, Ser: 0.63 mg/dL (ref 0.61–1.24)
Creatinine, Ser: 0.58 mg/dL — ABNORMAL LOW (ref 0.61–1.24)
GFR, Estimated: >60 mL/min (ref >60)
GFR, Estimated: >60 mL/min (ref >60)
Glucose, Bld: 128 mg/dL — ABNORMAL HIGH (ref 70–99)
Glucose, Bld: 137 mg/dL — ABNORMAL HIGH (ref 70–99)
Potassium: 4.3 mmol/L (ref 3.5–5.1)
Potassium: 4.1 mmol/L (ref 3.5–5.1)
Sodium: 133 mmol/L — ABNORMAL LOW (ref 135–145)
Sodium: 132 mmol/L — ABNORMAL LOW (ref 135–145)

## 2020-05-02 LAB — CBC
HCT: 23.4 % — ABNORMAL LOW (ref 39.0–52.0)
HCT: 24.4 % — ABNORMAL LOW (ref 39.0–52.0)
HCT: 20.4 % — ABNORMAL LOW (ref 39.0–52.0)
Hemoglobin: 8.3 g/dL — ABNORMAL LOW (ref 13.0–17.0)
Hemoglobin: 8.8 g/dL — ABNORMAL LOW (ref 13.0–17.0)
Hemoglobin: 6.7 g/dL — CL (ref 13.0–17.0)
MCH: 34.2 pg — ABNORMAL HIGH (ref 26.0–34.0)
MCH: 34.4 pg — ABNORMAL HIGH (ref 26.0–34.0)
MCH: 32.4 pg (ref 26.0–34.0)
MCHC: 35.5 g/dL (ref 30.0–36.0)
MCHC: 36.1 g/dL — ABNORMAL HIGH (ref 30.0–36.0)
MCHC: 32.8 g/dL (ref 30.0–36.0)
MCV: 96.3 fL (ref 80.0–100.0)
MCV: 95.3 fL (ref 80.0–100.0)
MCV: 98.6 fL (ref 80.0–100.0)
Platelets: 130 10*3/uL — ABNORMAL LOW (ref 150–400)
Platelets: 123 10*3/uL — ABNORMAL LOW (ref 150–400)
Platelets: 105 10*3/uL — ABNORMAL LOW (ref 150–400)
RBC: 2.43 MIL/uL — ABNORMAL LOW (ref 4.22–5.81)
RBC: 2.56 MIL/uL — ABNORMAL LOW (ref 4.22–5.81)
RBC: 2.07 MIL/uL — ABNORMAL LOW (ref 4.22–5.81)
RDW: 13.4 % (ref 11.5–15.5)
RDW: 14.5 % (ref 11.5–15.5)
RDW: 14.7 % (ref 11.5–15.5)
WBC: 11.4 10*3/uL — ABNORMAL HIGH (ref 4.0–10.5)
WBC: 11.1 10*3/uL — ABNORMAL HIGH (ref 4.0–10.5)
WBC: 10.1 10*3/uL (ref 4.0–10.5)
nRBC: 0 % (ref 0.0–0.2)
nRBC: 0 % (ref 0.0–0.2)
nRBC: 0 % (ref 0.0–0.2)

## 2020-05-02 LAB — PREPARE FRESH FROZEN PLASMA: Unit division: 0

## 2020-05-02 LAB — PREPARE RBC (CROSSMATCH)

## 2020-05-02 LAB — COOXEMETRY PANEL
Carboxyhemoglobin: 1.4 % (ref 0.5–1.5)
Methemoglobin: 0.9 % (ref 0.0–1.5)
O2 Saturation: 53.3 %
Total hemoglobin: 8.8 g/dL — ABNORMAL LOW (ref 12.0–16.0)

## 2020-05-02 LAB — MAGNESIUM
Magnesium: 2.2 mg/dL (ref 1.7–2.4)
Magnesium: 2.2 mg/dL (ref 1.7–2.4)

## 2020-05-02 IMAGING — DX DG CHEST 1V PORT
1 series · 1 of 1 positions shown · non-contrast
Comparison: [DATE].

CLINICAL DATA: Pneumothorax.

EXAM:
PORTABLE CHEST 1 VIEW

[chest ap]
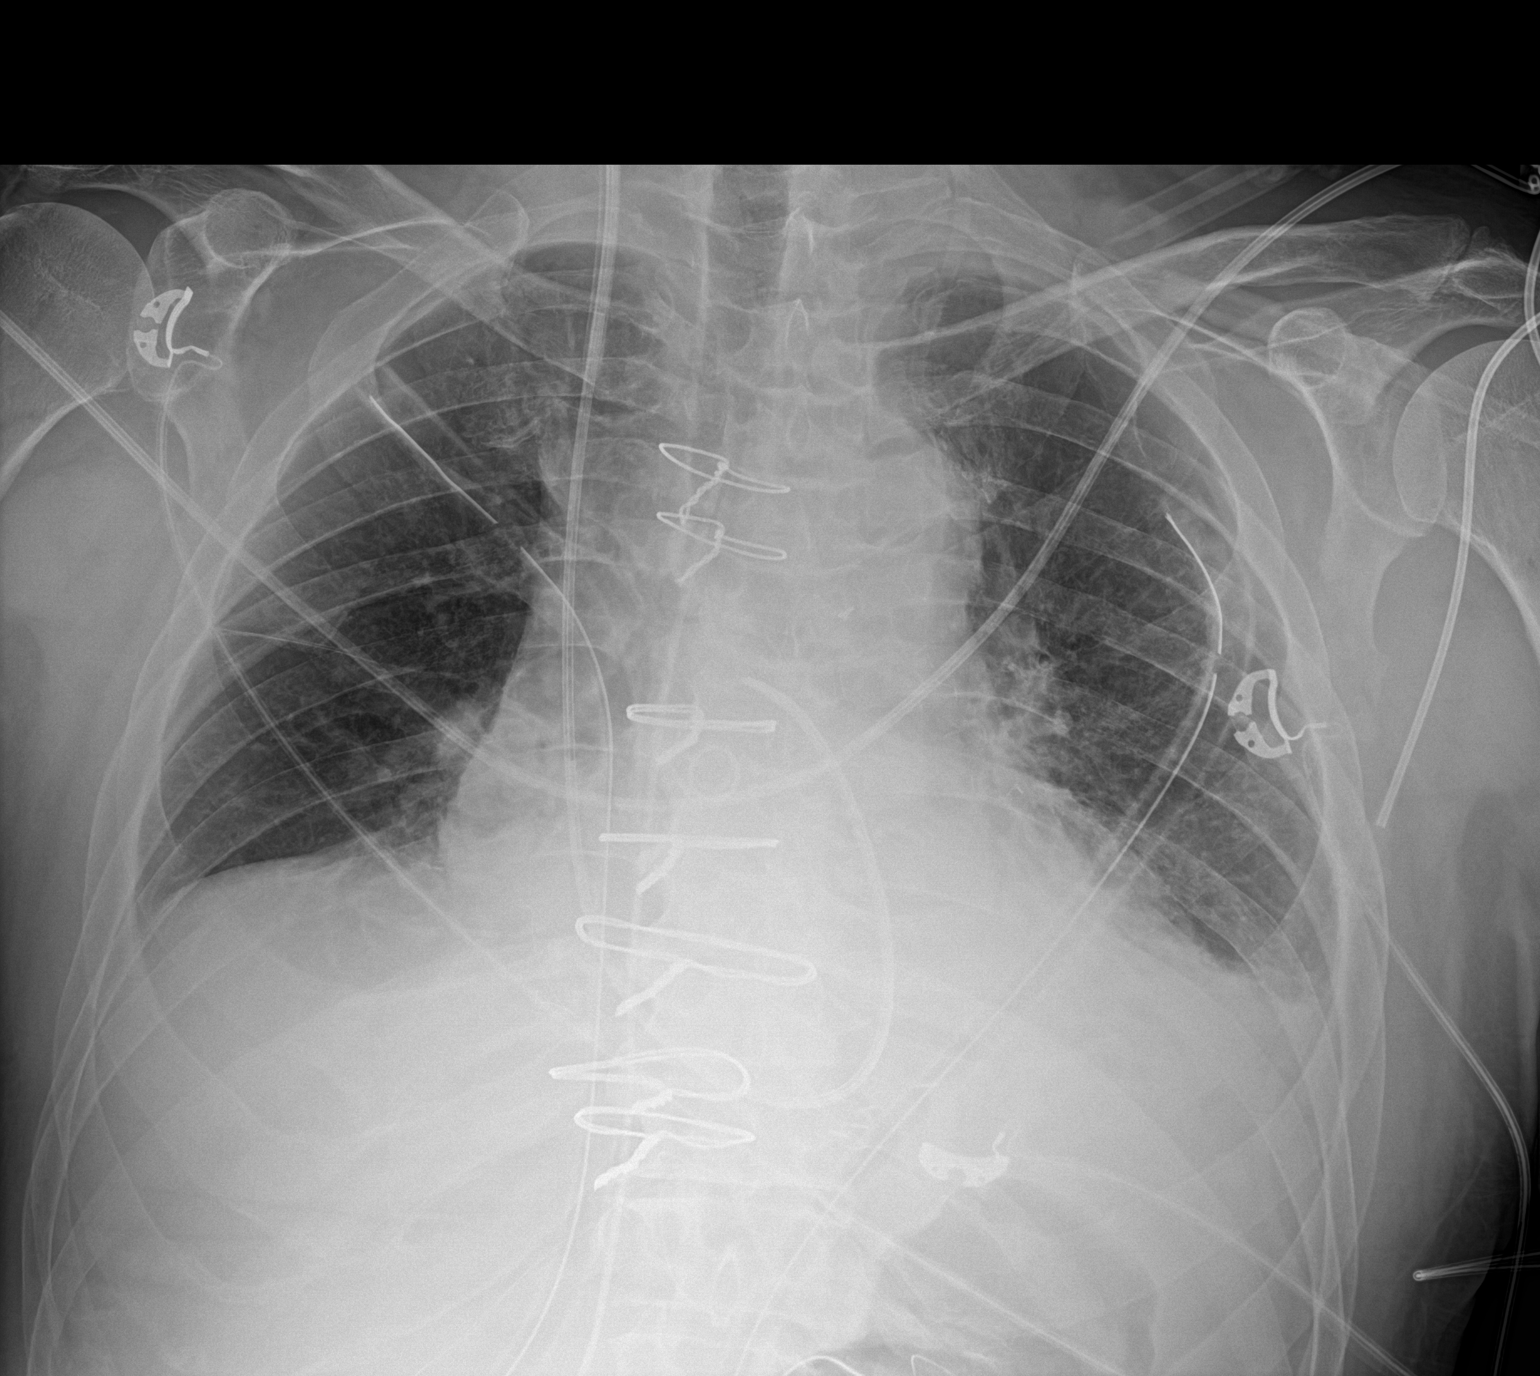

[1 of 1 positions shown; findings below may reference images not displayed]

FINDINGS: Stable cardiomegaly. Status post coronary bypass graft. Bilateral
chest tubes are noted without pneumothorax. Endotracheal and
nasogastric tubes have been removed. Right internal jugular
Swan-Ganz catheter remain. Left basilar atelectasis or infiltrate is
noted with small left pleural effusion. Bony thorax is unremarkable.
IMPRESSION: Bilateral chest tubes are noted without pneumothorax. Left basilar
atelectasis or infiltrate is noted with small left pleural effusion.
Endotracheal and nasogastric tubes have been removed.

## 2020-05-02 MED ORDER — MIDAZOLAM HCL 2 MG/2ML IJ SOLN
1.0000 mg | INTRAMUSCULAR | Status: DC | PRN
  Administered 2020-05-02: 1 mg via INTRAVENOUS
  Filled 2020-05-02: qty 2

## 2020-05-02 MED ORDER — SODIUM CHLORIDE 0.9 % IV SOLN
20.0000 ug | Freq: Once | INTRAVENOUS | Status: AC
  Administered 2020-05-02: 20 ug via INTRAVENOUS
  Filled 2020-05-02: qty 5

## 2020-05-02 MED ORDER — MILRINONE LACTATE IN DEXTROSE 20-5 MG/100ML-% IV SOLN: 0.1250 ug/kg/min | INTRAVENOUS | Status: DC

## 2020-05-02 MED ORDER — PHENOL 1.4 % MT LIQD
1.0000 | OROMUCOSAL | Status: DC | PRN
  Administered 2020-05-02: 1 via OROMUCOSAL
  Filled 2020-05-02: qty 177

## 2020-05-02 MED ORDER — LABETALOL HCL 5 MG/ML IV SOLN: 10.0000 mg | INTRAVENOUS | Status: DC | PRN

## 2020-05-02 MED ORDER — FUROSEMIDE 10 MG/ML IJ SOLN
20.0000 mg | Freq: Once | INTRAMUSCULAR | Status: AC
  Administered 2020-05-03: 20 mg via INTRAVENOUS
  Filled 2020-05-02: qty 2

## 2020-05-02 MED ORDER — SODIUM CHLORIDE 0.9% IV SOLUTION: Freq: Once | INTRAVENOUS | Status: DC

## 2020-05-02 MED ORDER — MORPHINE SULFATE (PF) 2 MG/ML IV SOLN
1.0000 mg | INTRAVENOUS | Status: DC | PRN
  Administered 2020-05-03 – 2020-05-04 (×4): 2 mg via INTRAVENOUS
  Filled 2020-05-02 (×4): qty 1

## 2020-05-02 MED ORDER — KETOROLAC TROMETHAMINE 15 MG/ML IJ SOLN
15.0000 mg | Freq: Four times a day (QID) | INTRAMUSCULAR | Status: DC
  Administered 2020-05-02: 15 mg via INTRAVENOUS
  Filled 2020-05-02: qty 1

## 2020-05-02 MED ORDER — FE FUMARATE-B12-VIT C-FA-IFC PO CAPS
1.0000 | ORAL_CAPSULE | Freq: Two times a day (BID) | ORAL | Status: DC
  Administered 2020-05-02 – 2020-05-07 (×10): 1 via ORAL
  Filled 2020-05-02 (×12): qty 1

## 2020-05-02 MED ORDER — ALBUMIN HUMAN 25 % IV SOLN
12.5000 g | Freq: Four times a day (QID) | INTRAVENOUS | Status: AC
  Administered 2020-05-02 – 2020-05-03 (×3): 12.5 g via INTRAVENOUS
  Filled 2020-05-02 (×3): qty 50

## 2020-05-02 MED ORDER — ALPRAZOLAM 0.5 MG PO TABS
0.5000 mg | ORAL_TABLET | Freq: Every evening | ORAL | Status: DC | PRN
  Administered 2020-05-02: 0.5 mg via ORAL
  Filled 2020-05-02: qty 1

## 2020-05-02 MED ORDER — INSULIN ASPART 100 UNIT/ML ~~LOC~~ SOLN
0.0000 [IU] | SUBCUTANEOUS | Status: DC
  Administered 2020-05-02 (×3): 2 [IU] via SUBCUTANEOUS

## 2020-05-02 MED FILL — Lidocaine HCl Local Soln Prefilled Syringe 100 MG/5ML (2%): INTRAMUSCULAR | Qty: 10 | Status: AC

## 2020-05-02 MED FILL — Mannitol IV Soln 20%: INTRAVENOUS | Qty: 500 | Status: AC

## 2020-05-02 MED FILL — Heparin Sodium (Porcine) Inj 1000 Unit/ML: INTRAMUSCULAR | Qty: 30 | Status: AC

## 2020-05-02 MED FILL — Sodium Bicarbonate IV Soln 8.4%: INTRAVENOUS | Qty: 50 | Status: AC

## 2020-05-02 MED FILL — Sodium Chloride IV Soln 0.9%: INTRAVENOUS | Qty: 2000 | Status: AC

## 2020-05-02 MED FILL — Mannitol IV Soln 20%: INTRAVENOUS | Qty: 500 | Status: CN

## 2020-05-02 MED FILL — Magnesium Sulfate Inj 50%: INTRAMUSCULAR | Qty: 10 | Status: AC

## 2020-05-02 MED FILL — Heparin Sodium (Porcine) Inj 1000 Unit/ML: INTRAMUSCULAR | Qty: 10 | Status: AC

## 2020-05-02 MED FILL — Electrolyte-R (PH 7.4) Solution: INTRAVENOUS | Qty: 7000 | Status: AC

## 2020-05-02 MED FILL — Potassium Chloride Inj 2 mEq/ML: INTRAVENOUS | Qty: 40 | Status: AC

## 2020-05-02 NOTE — Progress Notes (Signed)
TCTS BRIEF SICU PROGRESS NOTE  1 Day Post-Op  S/P Procedure(s) (LRB): CORONARY ARTERY BYPASS GRAFTING (CABG) X 2 ON CARDIOPULMONARY BYPASS. LIMA TO LAD, SVG TO DIAG (N/A) TRANSESOPHAGEAL ECHOCARDIOGRAM (TEE) (N/A) ENDOVEIN HARVEST OF GREATER SAPHENOUS VEIN (Right)   Stable day NSR w/ stable BP on low dose milrinone Breathing comfortably on nasal cannula UOP adequate Labs okay  Plan: Continue current plan  Rexene Alberts, MD 05/02/2020 5:27 PM

## 2020-05-02 NOTE — Progress Notes (Addendum)
Dr Prescott Gum aware of patients CBC results, would like to proceed with 2 units PRBC along with a unit of platelets. After first PRBC, he would like 20mg  IV lasix to be given.   Dr Prescott Gum also made aware of decreased urine output, with no urine output noted over the last 2 hours (2000-2200).   Results for KEAN, Philip Winters (MRN [id number]) as of 05/02/2020 22:10  Ref. Range 05/02/2020 20:00  WBC Latest Ref Range: 4.0 - 10.5 K/uL 10.1  RBC Latest Ref Range: 4.22 - 5.81 MIL/uL 2.07 (L)  Hemoglobin Latest Ref Range: 13.0 - 17.0 g/dL 6.7 (LL)  HCT Latest Ref Range: 39.0 - 52.0 % 20.4 (L)  MCV Latest Ref Range: 80.0 - 100.0 fL 98.6  MCH Latest Ref Range: 26.0 - 34.0 pg 32.4  MCHC Latest Ref Range: 30.0 - 36.0 g/dL 32.8  RDW Latest Ref Range: 11.5 - 15.5 % 14.7  Platelets Latest Ref Range: 150 - 400 K/uL 105 (L)  nRBC Latest Ref Range: 0.0 - 0.2 % 0.0
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
## 2020-05-02 NOTE — Discharge Instructions (Signed)
Discharge Instructions:  1. You may shower, please wash incisions daily with soap and water and keep dry.  If you wish to cover wounds with dressing you may do so but please keep clean and change daily.  No tub baths or swimming until incisions have completely healed.  If your incisions become red or develop any drainage please call our office at [phone]  2. No Driving until cleared by Dr. Lucianne Lei Trigt's office and you are no longer using narcotic pain medications  3. Monitor your weight daily.. Please use the same scale and weigh at same time... If you gain 5-10 lbs in 48 hours with associated lower extremity swelling, please contact our office at [phone]  4. Fever of 101.5 for at least 24 hours with no source, please contact our office at [phone]   Prediabetes Eating Plan Prediabetes is a condition that causes blood sugar (glucose) levels to be higher than normal. This increases the risk for developing diabetes. In order to prevent diabetes from developing, your health care provider may recommend a diet and other lifestyle changes to help you:  Control your blood glucose levels.  Improve your cholesterol levels.  Manage your blood pressure. Your health care provider may recommend working with a diet and nutrition specialist (dietitian) to make a meal plan that is best for you. What are tips for following this plan? Lifestyle  Set weight loss goals with the help of your health care team. It is recommended that most people with prediabetes lose 7% of their current body weight.  Exercise for at least 30 minutes at least 5 days a week.  Attend a support group or seek ongoing support from a mental health counselor.  Take over-the-counter and prescription medicines only as told by your health care provider. Reading food labels  Read food labels to check the amount of fat, salt (sodium), and sugar in prepackaged foods. Avoid foods that have: ? Saturated fats. ? Trans  fats. ? Added sugars.  Avoid foods that have more than 300 milligrams (mg) of sodium per serving. Limit your daily sodium intake to less than 2,300 mg each day. Shopping  Avoid buying pre-made and processed foods. Cooking  Cook with olive oil. Do not use butter, lard, or ghee.  Bake, broil, grill, or boil foods. Avoid frying. Meal planning   Work with your dietitian to develop an eating plan that is right for you. This may include: ? Tracking how many calories you take in. Use a food diary, notebook, or mobile application to track what you eat at each meal. ? Using the glycemic index (GI) to plan your meals. The index tells you how quickly a food will raise your blood glucose. Choose low-GI foods. These foods take a longer time to raise blood glucose.  Consider following a Mediterranean diet. This diet includes: ? Several servings each day of fresh fruits and vegetables. ? Eating fish at least twice a week. ? Several servings each day of whole grains, beans, nuts, and seeds. ? Using olive oil instead of other fats. ? Moderate alcohol consumption. ? Eating small amounts of red meat and whole-fat dairy.  If you have high blood pressure, you may need to limit your sodium intake or follow a diet such as the DASH eating plan. DASH is an eating plan that aims to lower high blood pressure. What foods are recommended? The items listed below may not be a complete list. Talk with your dietitian about what dietary choices are best for you.  Grains Whole grains, such as whole-wheat or whole-grain breads, crackers, cereals, and pasta. Unsweetened oatmeal. Bulgur. Barley. Quinoa. Brown rice. Corn or whole-wheat flour tortillas or taco shells. Vegetables Lettuce. Spinach. Peas. Beets. Cauliflower. Cabbage. Broccoli. Carrots. Tomatoes. Squash. Eggplant. Herbs. Peppers. Onions. Cucumbers. Brussels sprouts. Fruits Berries. Bananas. Apples. Oranges. Grapes. Papaya. Mango. Pomegranate. Kiwi.  Grapefruit. Cherries. Meats and other protein foods Seafood. Poultry without skin. Lean cuts of pork and beef. Tofu. Eggs. Nuts. Beans. Dairy Low-fat or fat-free dairy products, such as yogurt, cottage cheese, and cheese. Beverages Water. Tea. Coffee. Sugar-free or diet soda. Seltzer water. Lowfat or no-fat milk. Milk alternatives, such as soy or almond milk. Fats and oils Olive oil. Canola oil. Sunflower oil. Grapeseed oil. Avocado. Walnuts. Sweets and desserts Sugar-free or low-fat pudding. Sugar-free or low-fat ice cream and other frozen treats. Seasoning and other foods Herbs. Sodium-free spices. Mustard. Relish. Low-fat, low-sugar ketchup. Low-fat, low-sugar barbecue sauce. Low-fat or fat-free mayonnaise. What foods are not recommended? The items listed below may not be a complete list. Talk with your dietitian about what dietary choices are best for you. Grains Refined white flour and flour products, such as bread, pasta, snack foods, and cereals. Vegetables Canned vegetables. Frozen vegetables with butter or cream sauce. Fruits Fruits canned with syrup. Meats and other protein foods Fatty cuts of meat. Poultry with skin. Breaded or fried meat. Processed meats. Dairy Full-fat yogurt, cheese, or milk. Beverages Sweetened drinks, such as sweet iced tea and soda. Fats and oils Butter. Lard. Ghee. Sweets and desserts Baked goods, such as cake, cupcakes, pastries, cookies, and cheesecake. Seasoning and other foods Spice mixes with added salt. Ketchup. Barbecue sauce. Mayonnaise. Summary  To prevent diabetes from developing, you may need to make diet and other lifestyle changes to help control blood sugar, improve cholesterol levels, and manage your blood pressure.  Set weight loss goals with the help of your health care team. It is recommended that most people with prediabetes lose 7 percent of their current body weight.  Consider following a Mediterranean diet that includes  plenty of fresh fruits and vegetables, whole grains, beans, nuts, seeds, fish, lean meat, low-fat dairy, and healthy oils. This information is not intended to replace advice given to you by your health care provider. Make sure you discuss any questions you have with your health care provider. Document Revised: 08/27/2018 Document Reviewed: 07/09/2016 Elsevier Patient Education  [place].   5. Activity- up as tolerated, please walk at least 3 times per day.  Avoid strenuous activity, no lifting, pushing, or pulling with your arms over 8-10 lbs for a minimum of 6 weeks  6. If any questions or concerns arise, please do not hesitate to contact our office at [phone]

## 2020-05-02 NOTE — Progress Notes (Signed)
Dr Ricard Dillon notified of patients H/H result. 2 units PRBC ordered.   Results for IMANOL, BIHL (MRN [id number]) as of 05/02/2020 22:10  Ref. Range 05/02/2020 20:00  WBC Latest Ref Range: 4.0 - 10.5 K/uL 10.1  RBC Latest Ref Range: 4.22 - 5.81 MIL/uL 2.07 (L)  Hemoglobin Latest Ref Range: 13.0 - 17.0 g/dL 6.7 (LL)  HCT Latest Ref Range: 39.0 - 52.0 % 20.4 (L)  MCV Latest Ref Range: 80.0 - 100.0 fL 98.6  MCH Latest Ref Range: 26.0 - 34.0 pg 32.4  MCHC Latest Ref Range: 30.0 - 36.0 g/dL 32.8  RDW Latest Ref Range: 11.5 - 15.5 % 14.7  Platelets Latest Ref Range: 150 - 400 K/uL 105 (L)  nRBC Latest Ref Range: 0.0 - 0.2 % 0.0

## 2020-05-03 ENCOUNTER — Inpatient Hospital Stay (HOSPITAL_COMMUNITY): Payer: BC Managed Care – PPO

## 2020-05-03 ENCOUNTER — Inpatient Hospital Stay: Payer: Self-pay

## 2020-05-03 LAB — CBC
HCT: 26.3 % — ABNORMAL LOW (ref 39.0–52.0)
HCT: 30.8 % — ABNORMAL LOW (ref 39.0–52.0)
Hemoglobin: 9.5 g/dL — ABNORMAL LOW (ref 13.0–17.0)
Hemoglobin: 10.6 g/dL — ABNORMAL LOW (ref 13.0–17.0)
MCH: 32.6 pg (ref 26.0–34.0)
MCH: 31.3 pg (ref 26.0–34.0)
MCHC: 36.1 g/dL — ABNORMAL HIGH (ref 30.0–36.0)
MCHC: 34.4 g/dL (ref 30.0–36.0)
MCV: 90.4 fL (ref 80.0–100.0)
MCV: 90.9 fL (ref 80.0–100.0)
Platelets: 115 10*3/uL — ABNORMAL LOW (ref 150–400)
Platelets: 147 10*3/uL — ABNORMAL LOW (ref 150–400)
RBC: 2.91 MIL/uL — ABNORMAL LOW (ref 4.22–5.81)
RBC: 3.39 MIL/uL — ABNORMAL LOW (ref 4.22–5.81)
RDW: 16.6 % — ABNORMAL HIGH (ref 11.5–15.5)
RDW: 16.1 % — ABNORMAL HIGH (ref 11.5–15.5)
WBC: 12.4 10*3/uL — ABNORMAL HIGH (ref 4.0–10.5)
WBC: 12.7 10*3/uL — ABNORMAL HIGH (ref 4.0–10.5)
nRBC: 0 % (ref 0.0–0.2)
nRBC: 0 % (ref 0.0–0.2)

## 2020-05-03 LAB — PROTIME-INR
INR: 1.2 (ref 0.8–1.2)
Prothrombin Time: 15.1 seconds (ref 11.4–15.2)

## 2020-05-03 LAB — PREPARE PLATELET PHERESIS: Unit division: 0

## 2020-05-03 LAB — GLUCOSE, CAPILLARY
Glucose-Capillary: 118 mg/dL — ABNORMAL HIGH (ref 70–99)
Glucose-Capillary: 108 mg/dL — ABNORMAL HIGH (ref 70–99)
Glucose-Capillary: 139 mg/dL — ABNORMAL HIGH (ref 70–99)
Glucose-Capillary: 100 mg/dL — ABNORMAL HIGH (ref 70–99)
Glucose-Capillary: 125 mg/dL — ABNORMAL HIGH (ref 70–99)

## 2020-05-03 LAB — BASIC METABOLIC PANEL
Anion gap: 9 (ref 5–15)
BUN: 6 mg/dL (ref 6–20)
CO2: 26 mmol/L (ref 22–32)
Calcium: 8.3 mg/dL — ABNORMAL LOW (ref 8.9–10.3)
Chloride: 97 mmol/L — ABNORMAL LOW (ref 98–111)
Creatinine, Ser: 0.58 mg/dL — ABNORMAL LOW (ref 0.61–1.24)
GFR, Estimated: >60 mL/min (ref >60)
Glucose, Bld: 110 mg/dL — ABNORMAL HIGH (ref 70–99)
Potassium: 3.7 mmol/L (ref 3.5–5.1)
Sodium: 132 mmol/L — ABNORMAL LOW (ref 135–145)

## 2020-05-03 LAB — BPAM PLATELET PHERESIS
Blood Product Expiration Date: 202112162359
ISSUE DATE / TIME: 202112152218
Unit Type and Rh: 5100

## 2020-05-03 LAB — APTT: aPTT: 34 seconds (ref 24–36)

## 2020-05-03 IMAGING — DX DG CHEST 1V PORT
1 series · 1 of 1 positions shown · non-contrast
Comparison: [DATE].

CLINICAL DATA: Chest tube.  Open-heart surgery.

EXAM:
PORTABLE CHEST 1 VIEW

[chest ap]
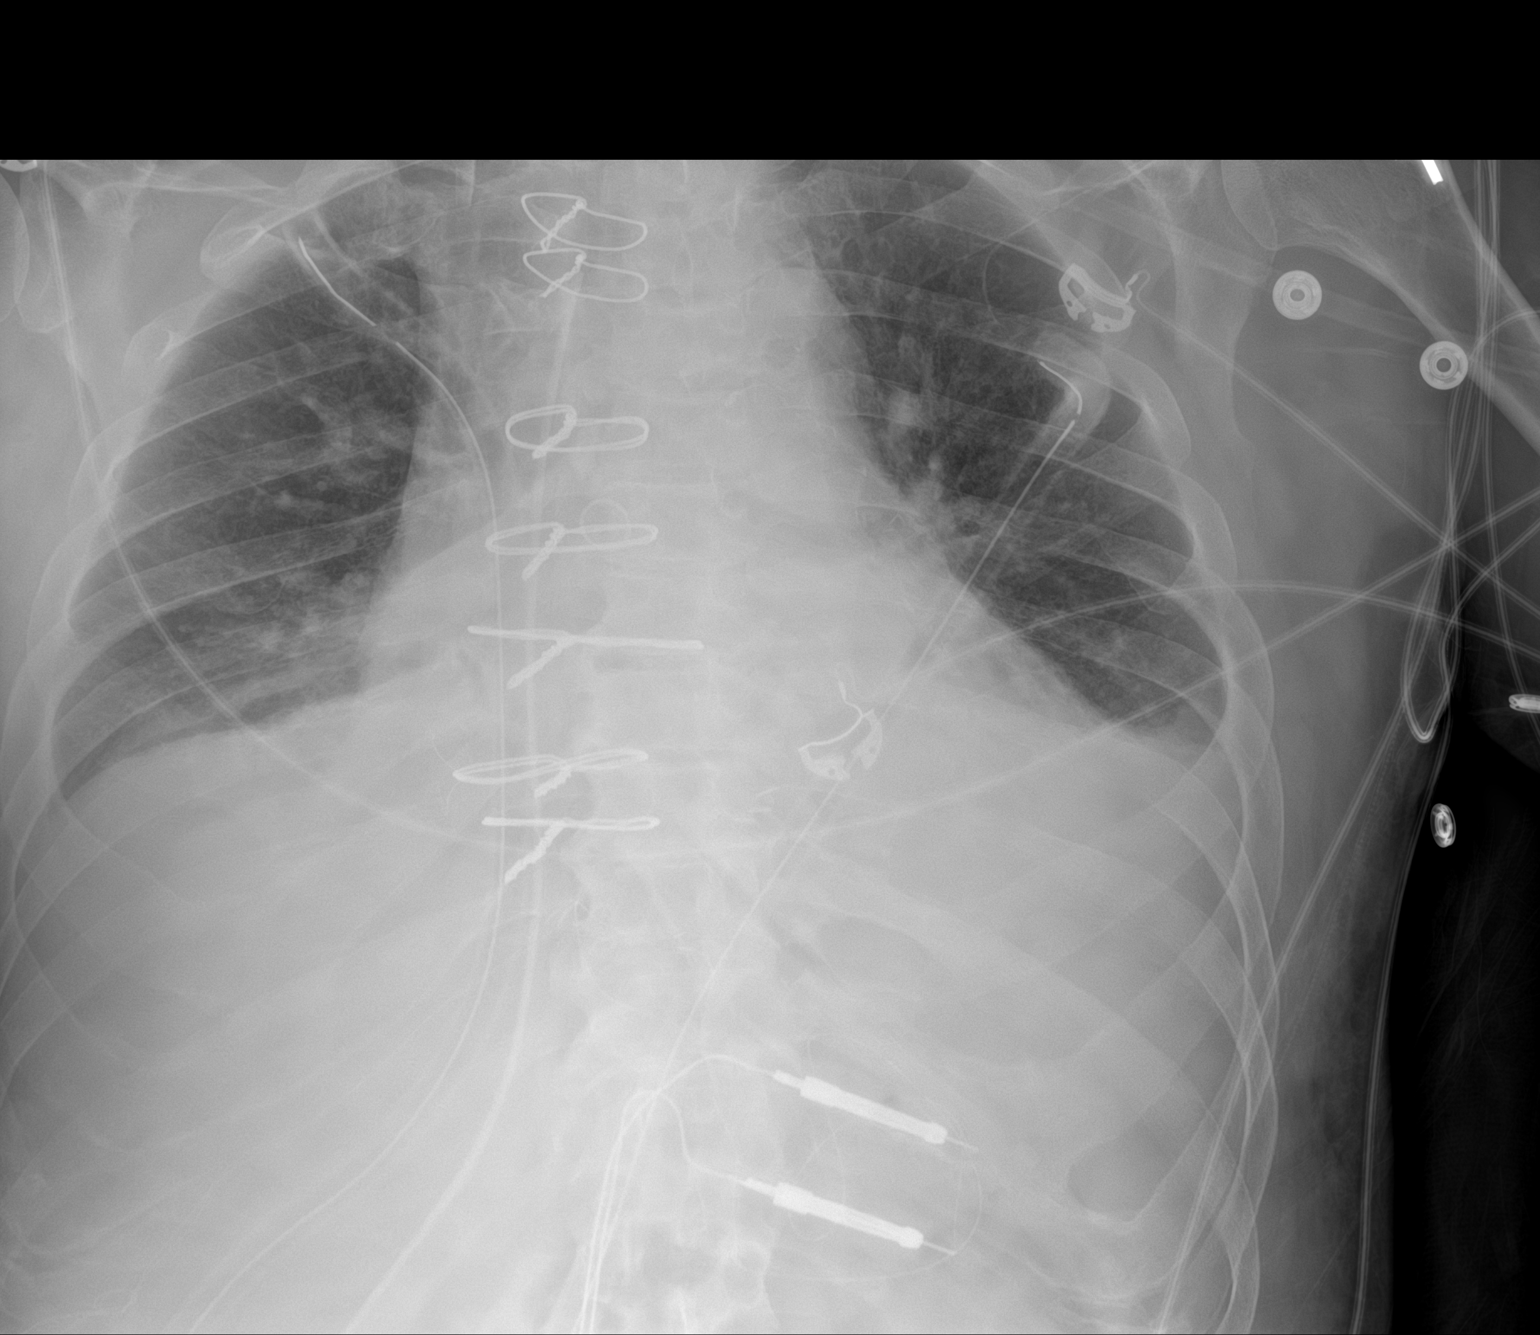

[1 of 1 positions shown; findings below may reference images not displayed]

FINDINGS: Interval removal of Swan-Ganz catheter. Right IJ sheath in stable
position. Mediastinal drainage catheter and bilateral chest tubes in
stable position. Prior CABG. Stable cardiomegaly. Persistent
bibasilar atelectasis. Tiny left pleural effusion cannot be
excluded. No pneumothorax. Degenerative change thoracic spine.
IMPRESSION: 1. Interval removal of Swan-Ganz catheter. Right IJ sheath in stable
position. Mediastinal drainage catheter and bilateral chest tubes in
stable position. No pneumothorax.
2. Prior CABG. Stable cardiomegaly.
3. Persistent mild bibasilar atelectasis. Tiny left pleural effusion
cannot be excluded.

## 2020-05-03 MED ORDER — FUROSEMIDE 10 MG/ML IJ SOLN
20.0000 mg | Freq: Every day | INTRAMUSCULAR | Status: DC
  Administered 2020-05-03: 20 mg via INTRAVENOUS
  Filled 2020-05-03: qty 2

## 2020-05-03 MED ORDER — MIDAZOLAM HCL 2 MG/2ML IJ SOLN: 1.0000 mg | Freq: Four times a day (QID) | INTRAMUSCULAR | Status: DC | PRN

## 2020-05-03 MED ORDER — GUAIFENESIN ER 600 MG PO TB12
600.0000 mg | ORAL_TABLET | Freq: Two times a day (BID) | ORAL | Status: DC
  Administered 2020-05-03 – 2020-05-07 (×9): 600 mg via ORAL
  Filled 2020-05-03 (×10): qty 1

## 2020-05-03 MED ORDER — HYDROMORPHONE HCL 2 MG PO TABS
1.0000 mg | ORAL_TABLET | ORAL | Status: DC | PRN
  Administered 2020-05-03 – 2020-05-04 (×3): 1 mg via ORAL
  Filled 2020-05-03 (×3): qty 1

## 2020-05-03 MED ORDER — POTASSIUM CHLORIDE CRYS ER 20 MEQ PO TBCR
20.0000 meq | EXTENDED_RELEASE_TABLET | Freq: Two times a day (BID) | ORAL | Status: DC
  Administered 2020-05-03 – 2020-05-05 (×4): 20 meq via ORAL
  Filled 2020-05-03 (×4): qty 1

## 2020-05-03 MED ORDER — ALPRAZOLAM 0.5 MG PO TABS
0.5000 mg | ORAL_TABLET | Freq: Three times a day (TID) | ORAL | Status: DC | PRN
  Administered 2020-05-03 – 2020-05-04 (×2): 0.5 mg via ORAL
  Filled 2020-05-03 (×2): qty 1

## 2020-05-03 MED ORDER — MOMETASONE FURO-FORMOTEROL FUM 200-5 MCG/ACT IN AERO
2.0000 | INHALATION_SPRAY | Freq: Two times a day (BID) | RESPIRATORY_TRACT | Status: DC
  Administered 2020-05-03 – 2020-05-07 (×9): 2 via RESPIRATORY_TRACT
  Filled 2020-05-03: qty 8.8

## 2020-05-03 MED ORDER — DILTIAZEM HCL 60 MG PO TABS: 60.0000 mg | ORAL_TABLET | Freq: Three times a day (TID) | ORAL | Status: DC

## 2020-05-03 MED ORDER — ALBUMIN HUMAN 25 % IV SOLN: 12.5000 g | Freq: Four times a day (QID) | INTRAVENOUS | Status: DC

## 2020-05-03 MED ORDER — DILTIAZEM HCL ER 60 MG PO CP12
120.0000 mg | ORAL_CAPSULE | Freq: Two times a day (BID) | ORAL | Status: DC
  Administered 2020-05-04 – 2020-05-06 (×5): 120 mg via ORAL
  Filled 2020-05-03 (×6): qty 2

## 2020-05-03 MED ORDER — INSULIN ASPART 100 UNIT/ML ~~LOC~~ SOLN
0.0000 [IU] | Freq: Three times a day (TID) | SUBCUTANEOUS | Status: DC
  Administered 2020-05-03 – 2020-05-04 (×3): 2 [IU] via SUBCUTANEOUS

## 2020-05-03 MED ORDER — BENZONATATE 100 MG PO CAPS
100.0000 mg | ORAL_CAPSULE | Freq: Three times a day (TID) | ORAL | Status: DC
  Administered 2020-05-03 – 2020-05-05 (×7): 100 mg via ORAL
  Filled 2020-05-03 (×7): qty 1

## 2020-05-03 MED ORDER — FUROSEMIDE 10 MG/ML IJ SOLN
20.0000 mg | Freq: Two times a day (BID) | INTRAMUSCULAR | Status: DC
  Administered 2020-05-04: 20 mg via INTRAVENOUS
  Filled 2020-05-03: qty 2

## 2020-05-03 MED ORDER — PHENYLEPHRINE HCL-NACL 10-0.9 MG/250ML-% IV SOLN
INTRAVENOUS | Status: AC
  Filled 2020-05-03: qty 500

## 2020-05-03 NOTE — Progress Notes (Signed)
Unable to draw labs from central line. Lab made aware that the patient will need to have a venipuncture.

## 2020-05-03 NOTE — Op Note (Signed)
NAMETOREY, Philip Winters:[id number] ACCOUNT [id number] DATE OF BIRTH:27-Dec-1960 FACILITY: [facility] LOCATION: [facility] PHYSICIAN:Philip Bernabe [name], MD  OPERATIVE REPORT  DATE OF PROCEDURE:  05/01/2020  OPERATION: 1.  Coronary artery bypass grafting x2 (left internal mammary artery to left anterior descending, saphenous vein graft to diagonal). 2.  Endoscopic harvest of right leg greater saphenous vein.  SURGEON:  Philip Poot, MD  ASSISTANT:  Philip Pinks, PA-C  ANESTHESIA:  General by Dr. Lillia Abed.  PREOPERATIVE DIAGNOSES:  Severe 2-vessel coronary artery disease with class Winters progressive angina.  POSTOPERATIVE DIAGNOSES:  Severe 2-vessel coronary artery disease with class Winters progressive angina.  CLINICAL NOTE:  The patient is a 59 year old hypertensive male, reformed smoker and history of dyslipidemia, who recently developed exertional symptoms of angina and was found to have significant coronary artery disease, not amenable to PCI.  He has a  history of a bicuspid aortic valve and mild aortic stenosis, both by echo and cardiac catheterization.  I saw the patient in consultation in the office and recommended coronary revascularization as his best long-term option with target vessels being the  LAD and large diagonal branch.  The small nondominant right coronary also had disease, but was too small for grafting.  I discussed the procedure of CABG in detail with the patient and his wife including the use of general anesthesia, the location of the  surgical incisions, and the expected postoperative hospital recovery.  I reviewed the risks to him of coronary bypass surgery including the risk of stroke, MI, bleeding, blood transfusion, postoperative infection, postoperative organ failure, and death.   After reviewing these issues, he demonstrated his understanding and agreed to proceed with surgery under what I felt was an informed consent.  OPERATIVE FINDINGS: 1.   Difficult exposure of the heart and internal mammary artery harvest due to very rigid chest wall. 2.  Adequate conduit and small, but adequate coronary targets. 3.  The TEE exam of the aortic valve showed it to be tricuspid with a gradient less than 10 mmHg and the valve was left alone. 4.  Preoperative study showing an ill-defined nodular density in the left lower lobe was not visualized or palpated after the chest was opened due to poor mobility of the chest wall.  DESCRIPTION OF PROCEDURE:  The patient was brought from preoperative holding where informed consent was documented, final issues were addressed with the patient.  He was placed supine on the operating table and general anesthesia was induced.  He  remained stable.  A Foley catheter and transesophageal echo probe were placed by the anesthesia team and the OR nurses.  The patient was then prepped and draped as a sterile field.  A proper time-out was performed.  A sternal incision was made as the  saphenous vein was harvested endoscopically from the right leg.  The left internal mammary artery was harvested as a pedicle graft from its origin at the subclavian vessels.  This was very difficult and time consuming due to the poor exposure of the left  chest with the sternal elevating retractor being unable to provide much exposure to his very rigid chest wall.  Also, the mammary artery was adherent to the parietal pleura and difficult to dissect from the chest wall as well.  The mammary artery was  harvested, however, as an adequate conduit with good flow, and was wrapped in a papaverine soaked gauze and placed in the chest cavity.  The sternal retractor was placed and the chest  was expanded with great difficulty.  The pericardium was opened and suspended.  The ascending aorta was minimally dilated less than 60 cm.  Pursestrings were placed in the ascending aorta and right atrium and  after the heparin was administered and ACT documented as being  therapeutic, the patient was cannulated and placed on bypass.  The coronaries were then identified for grafting.  The LAD and diagonal were adequate targets.  The right coronary was too  small to graft.  The mammary artery and vein grafts were prepared for the distal anastomoses and cardioplegia cannulas were placed for both antegrade and retrograde cold blood cardioplegia.  The patient was cooled to 32 degrees, and the aortic crossclamp  was applied.  One liter of cold blood cardioplegia was delivered in split doses between the antegrade aortic and retrograde coronary sinus catheters.  There was good cardioplegic arrest and supple temperature dropped less than 14 degrees.  Cardioplegia  was delivered every 20 minutes while the cross clamp was in place.  The distal coronary anastomoses were performed.  The first distal anastomosis was to the diagonal branch of the LAD.  This was a 1.5 mm vessel.  It had heavy proximal calcium and stenosis.  A reverse saphenous vein was sewn end-to-side with running 7-0  Prolene with good flow through the graft.  Cardioplegia was redosed.  While the heart was elevated, the area of the proximal LAD and bifurcation into the diagonal was inspected and palpated; however, the small coronary aneurysm noted on cardiac  catheterization was not identifiable or palpable.  The LAD in that area had also significant calcification.  The mammary artery pedicle was then brought through an opening in the pericardium and was brought down onto the LAD for the second distal anastomosis.  This was created with a running 8-0 Prolene to a 1.5 mm LAD.  The pedicle clamp on the mammary pedicle  was released and there was good flow through the anastomosis and hemostasis was documented.  The pedicle clamp was reapplied, and the pedicle secured to the epicardium with 6-0 Prolenes.  Cardioplegia was redosed.  While the cross clamp was still in place, the proximal vein anastomosis was performed on the  ascending aorta using a 4.5 mm punch and running 6-0 Prolene.  Prior to tying down the final proximal anastomosis, air was vented from the coronaries with a dose  of retrograde warm blood cardioplegia.  The crossclamp was removed and the vein graft was deaired.  The cardioplegia cannula was removed and the proximal and distal anastomoses were checked and found to be hemostatic.  The patient was cardioverted twice without success and the defibrillator  paddles were changed out to a new set and with a single shock, the heart converted back to a regular rhythm.  The patient was rewarmed and reperfused.  Temporary pacing wires were applied.  After the patient had been adequately reperfused, the lungs were reexpanded, ventilator was resumed.  The patient was then weaned from cardiopulmonary bypass without  difficulty.  Echo showed good LV function and hemodynamics were stable.  Protamine was administered without adverse reaction.  There was still considerable generalized bleeding, especially from the chest wall after reversal of heparin and the patient was  given FFP with some improvement.  It took a significant period of time to obtain adequate hemostasis of the chest wall, mediastinum and left pleural space.  Bilateral pleural tubes and an anterior mediastinal tube were placed and brought out through  separate incisions.  The  sternum was then closed with interrupted steel wire.  The patient remained stable.  The pectoralis fascia was closed with a running #1 Vicryl, and subcutaneous and skin layers were closed with a running Vicryl.  The leg incision  was closed in a standard fashion.  Sterile dressings were applied.  Total cardiopulmonary bypass time was 102 minutes.  HN/NUANCE  D:05/02/2020 T:05/03/2020 JOB:[id number]

## 2020-05-03 NOTE — Progress Notes (Signed)
2 Days Post-Op Procedure(s) (LRB): CORONARY ARTERY BYPASS GRAFTING (CABG) X 2 ON CARDIOPULMONARY BYPASS. LIMA TO LAD, SVG TO DIAG (N/A) TRANSESOPHAGEAL ECHOCARDIOGRAM (TEE) (N/A) ENDOVEIN HARVEST OF GREATER SAPHENOUS VEIN (Right) Subjective: Having difficulty thick sputum - mucinex, nebs ordered Hb, blood pressure improved after PRBC last pm Chest tube output now thin and decreased Sinus tach 110 Objective: Vital signs in last 24 hours: Temp:  [97.88 F (36.6 C)-98.7 F (37.1 C)] 98.3 F (36.8 C) (12/16 0735) Pulse Rate:  [80-112] 111 (12/16 0900) Resp:  [14-36] 33 (12/16 0900) BP: (79-142)/(60-117) 121/83 (12/16 0900) SpO2:  [91 %-99 %] 91 % (12/16 0900) Arterial Line BP: (88-97)/(44-47) 97/47 (12/15 1015)  Hemodynamic parameters for last 24 hours: PAP: (22-41)/(10-30) 41/30  Intake/Output from previous day: 12/15 0701 - 12/16 0700 In: 2248.1 [I.V.:739.8; Blood:1208; IV Piggyback:300.3] Out: 2220 [Urine:1460; Chest Tube:760] Intake/Output this shift: Total I/O In: 96.5 [I.V.:96.5] Out: 70 [Chest Tube:70]       Exam    General- alert and comfortable    Neck- no JVD, no cervical adenopathy palpable, no carotid bruit   Lungs- clear without rales, wheezes   Cor- regular rate and rhythm, no murmur , gallop   Abdomen- soft, non-tender   Extremities - warm, non-tender, minimal edema   Neuro- oriented, appropriate, no focal weakness   Lab Results: Recent Labs    05/02/20 2000 05/03/20 0530  WBC 10.1 12.4*  HGB 6.7* 9.5*  HCT 20.4* 26.3*  PLT 105* 115*   BMET:  Recent Labs    05/02/20 1500 05/03/20 0530  NA 132* 132*  K 4.1 3.7  CL 102 97*  CO2 23 26  GLUCOSE 137* 110*  BUN 9 6  CREATININE 0.58* 0.58*  CALCIUM 8.0* 8.3*    PT/INR:  Recent Labs    05/03/20 0530  LABPROT 15.1  INR 1.2   ABG    Component Value Date/Time   PHART 7.350 05/01/2020 1931   HCO3 22.4 05/01/2020 1931   TCO2 24 05/01/2020 1931   ACIDBASEDEF 3.0 (H) 05/01/2020 1931    O2SAT 53.3 05/02/2020 0451   CBG (last 3)  Recent Labs    05/02/20 2341 05/03/20 0534 05/03/20 0733  GLUCAP 118* 118* 108*    Assessment/Plan: S/P Procedure(s) (LRB): CORONARY ARTERY BYPASS GRAFTING (CABG) X 2 ON CARDIOPULMONARY BYPASS. LIMA TO LAD, SVG TO DIAG (N/A) TRANSESOPHAGEAL ECHOCARDIOGRAM (TEE) (N/A) ENDOVEIN HARVEST OF GREATER SAPHENOUS VEIN (Right) Mobilize Diuresis Diabetes control wean off 0.125  milrinone bleeding from chest wall imroved- OOB to chair but no ambulation today- leave chest tubes in place  LOS: 2 days    Tharon Aquas Trigt III 05/03/2020

## 2020-05-03 NOTE — Anesthesia Procedure Notes (Addendum)
Central Venous Catheter Insertion Performed by: Effie Berkshire, MD, anesthesiologist Start/End12/14/2021 7:55 AM, 05/01/2020 7:57 AM Patient location: Pre-op. Preanesthetic checklist: patient identified, IV checked, site marked, risks and benefits discussed, surgical consent, monitors and equipment checked, pre-op evaluation, timeout performed and anesthesia consent Hand hygiene performed  and maximum sterile barriers used  PA cath was placed.Swan type:thermodilution Procedure performed without using ultrasound guided technique. Attempts: 1 Patient tolerated the procedure well with no immediate complications.

## 2020-05-03 NOTE — Addendum Note (Signed)
Addendum  created 05/03/20 0941 by Effie Berkshire, MD   Child order released for a procedure order, Clinical Note Signed, Intraprocedure Blocks edited, LDA created via procedure documentation

## 2020-05-03 NOTE — Progress Notes (Signed)
Repeat CBC after transfusion of 2 units PRBC (3 total).  WBC 12.4High K/uL MCH 32.6 pg  RBC 2.91Low MIL/uL MCHC 36.1High g/dL  Hemoglobin 9.5Low g/dL  RDW 16.6High %  HCT 26.3Low % Platelets 115Low K/uL   MCV 90.4 fL  nRBC 0.0 %

## 2020-05-03 NOTE — Anesthesia Procedure Notes (Signed)
Central Venous Catheter Insertion Performed by: Effie Berkshire, MD, anesthesiologist Start/End12/14/2021 7:45 AM, 05/01/2020 7:55 AM Patient location: Pre-op. Preanesthetic checklist: patient identified, IV checked, site marked, risks and benefits discussed, surgical consent, monitors and equipment checked, pre-op evaluation, timeout performed and anesthesia consent Position: Trendelenburg Lidocaine 1% used for infiltration and patient sedated Hand hygiene performed , maximum sterile barriers used  and Seldinger technique used Catheter size: 9 Fr Total catheter length 10. Central line was placed.Sheath introducer Swan type:thermodilution PA Cath depth:50 Procedure performed using ultrasound guided technique. Ultrasound Notes:anatomy identified, needle tip was noted to be adjacent to the nerve/plexus identified, no ultrasound evidence of intravascular and/or intraneural injection and image(s) printed for medical record Attempts: 1 Following insertion, line sutured, dressing applied and Biopatch. Post procedure assessment: blood return through all ports, free fluid flow and no air  Patient tolerated the procedure well with no immediate complications.

## 2020-05-03 NOTE — Progress Notes (Signed)
CT surgery team rounds  Stable day up in chair Hematocrit stable at 30% Blood pressure improved and diuresing Chest tube drainage 30-40 cc/h Blood pressure 123/87, pulse (!) 115, temperature 98.8 F (37.1 C), temperature source Oral, resp. rate (!) 27, height 5\' 8"  (1.727 m), weight 75.8 kg, SpO2 95 %.

## 2020-05-04 ENCOUNTER — Inpatient Hospital Stay (HOSPITAL_COMMUNITY): Payer: BC Managed Care – PPO

## 2020-05-04 LAB — CBC
HCT: 30.6 % — ABNORMAL LOW (ref 39.0–52.0)
Hemoglobin: 10.5 g/dL — ABNORMAL LOW (ref 13.0–17.0)
MCH: 31.3 pg (ref 26.0–34.0)
MCHC: 34.3 g/dL (ref 30.0–36.0)
MCV: 91.3 fL (ref 80.0–100.0)
Platelets: 155 10*3/uL (ref 150–400)
RBC: 3.35 MIL/uL — ABNORMAL LOW (ref 4.22–5.81)
RDW: 15.6 % — ABNORMAL HIGH (ref 11.5–15.5)
WBC: 11.3 10*3/uL — ABNORMAL HIGH (ref 4.0–10.5)
nRBC: 0 % (ref 0.0–0.2)

## 2020-05-04 LAB — BASIC METABOLIC PANEL
Anion gap: 9 (ref 5–15)
BUN: <5 mg/dL — ABNORMAL LOW (ref 6–20)
CO2: 26 mmol/L (ref 22–32)
Calcium: 8.3 mg/dL — ABNORMAL LOW (ref 8.9–10.3)
Chloride: 95 mmol/L — ABNORMAL LOW (ref 98–111)
Creatinine, Ser: 0.5 mg/dL — ABNORMAL LOW (ref 0.61–1.24)
GFR, Estimated: >60 mL/min (ref >60)
Glucose, Bld: 103 mg/dL — ABNORMAL HIGH (ref 70–99)
Potassium: 3.5 mmol/L (ref 3.5–5.1)
Sodium: 130 mmol/L — ABNORMAL LOW (ref 135–145)

## 2020-05-04 LAB — GLUCOSE, CAPILLARY
Glucose-Capillary: 98 mg/dL (ref 70–99)
Glucose-Capillary: 118 mg/dL — ABNORMAL HIGH (ref 70–99)
Glucose-Capillary: 111 mg/dL — ABNORMAL HIGH (ref 70–99)
Glucose-Capillary: 145 mg/dL — ABNORMAL HIGH (ref 70–99)

## 2020-05-04 IMAGING — DX DG CHEST 1V PORT
1 series · 1 of 1 positions shown · non-contrast
Comparison: [DATE]

CLINICAL DATA: Status post cardiac bypass grafting

EXAM:
PORTABLE CHEST 1 VIEW

[chest ap]
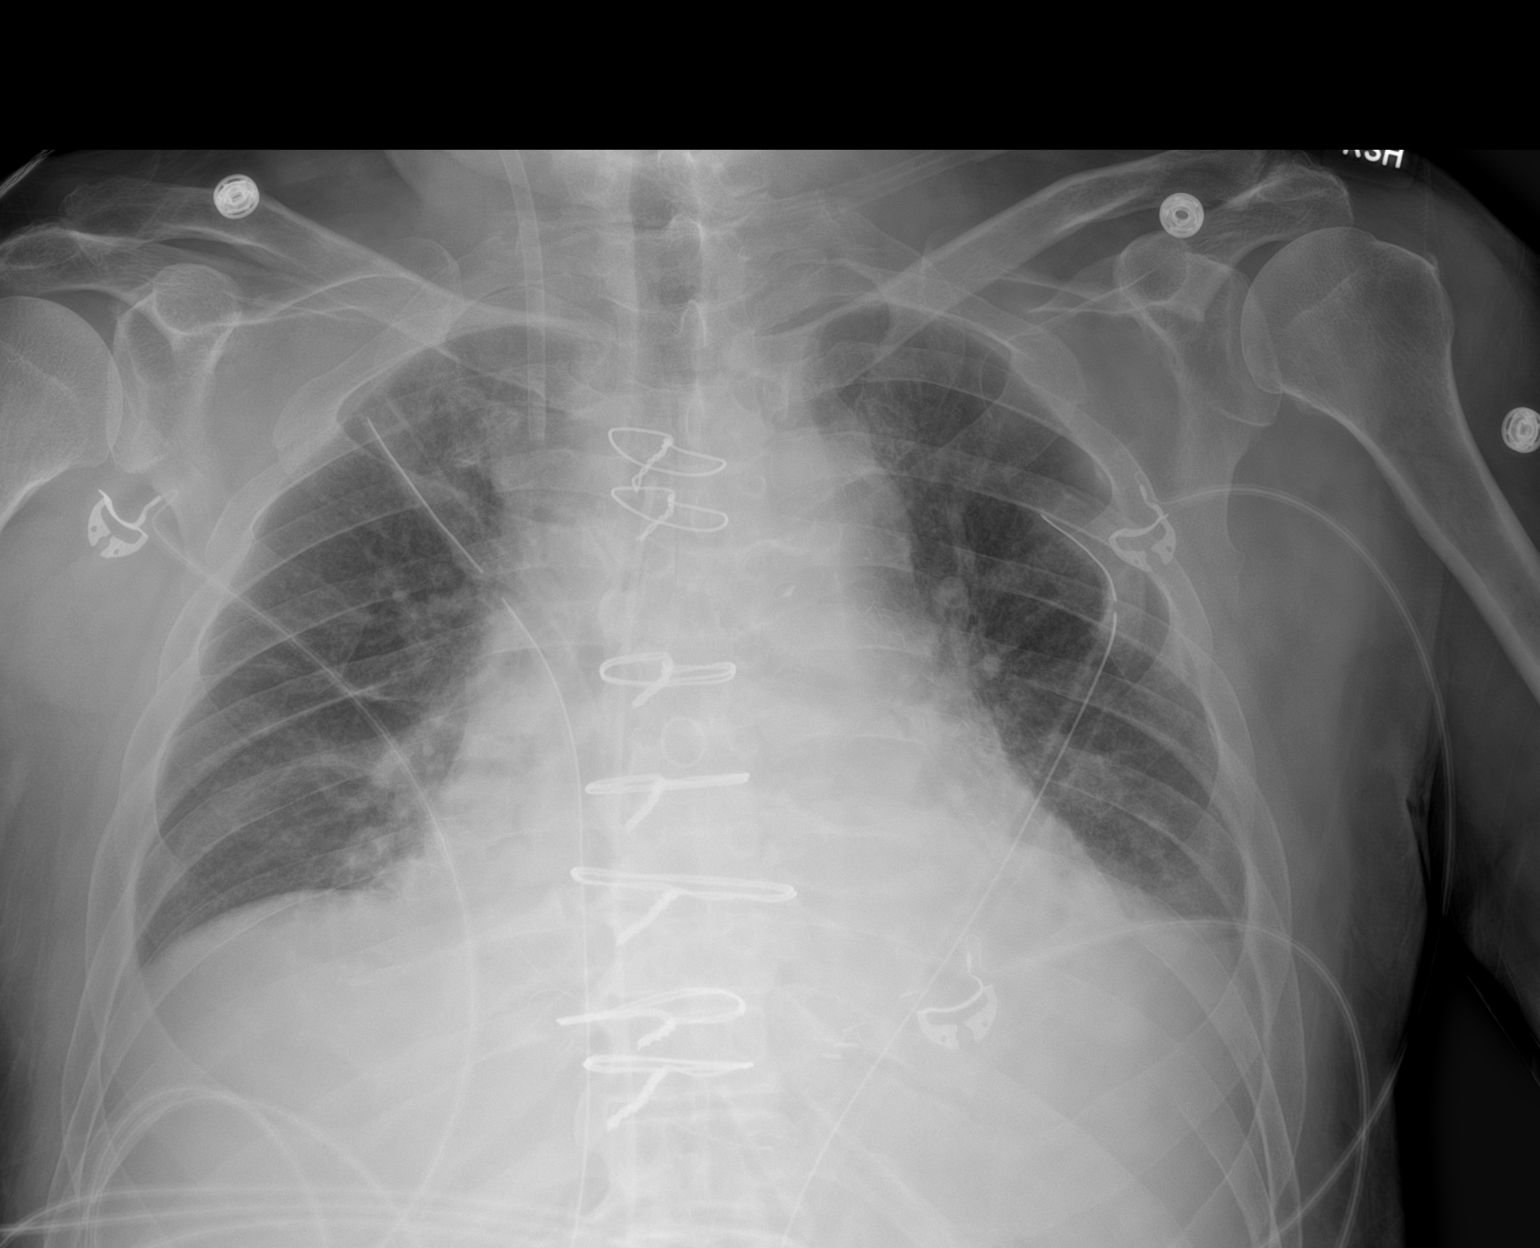

[1 of 1 positions shown; findings below may reference images not displayed]

FINDINGS: Cardiac shadow remains enlarged. Postsurgical changes are again
seen. Mediastinal drain and bilateral thoracostomy catheters are
noted. No pneumothorax is seen. Right jugular sheath remains in
place. Minimal effusions are noted bilaterally. Mild left basilar
atelectasis is noted.
IMPRESSION: Mild left basilar atelectasis.

Tubes and lines as described.

## 2020-05-04 MED ORDER — POTASSIUM CHLORIDE CRYS ER 20 MEQ PO TBCR
20.0000 meq | EXTENDED_RELEASE_TABLET | ORAL | Status: AC
  Administered 2020-05-04 (×3): 20 meq via ORAL
  Filled 2020-05-04 (×3): qty 1

## 2020-05-04 MED ORDER — AMIODARONE HCL 200 MG PO TABS
200.0000 mg | ORAL_TABLET | Freq: Two times a day (BID) | ORAL | Status: DC
  Administered 2020-05-04 – 2020-05-05 (×3): 200 mg via ORAL
  Filled 2020-05-04 (×3): qty 1

## 2020-05-04 MED ORDER — METOLAZONE 5 MG PO TABS
5.0000 mg | ORAL_TABLET | Freq: Every day | ORAL | Status: AC
  Administered 2020-05-04 – 2020-05-06 (×3): 5 mg via ORAL
  Filled 2020-05-04 (×3): qty 1

## 2020-05-04 MED ORDER — FUROSEMIDE 10 MG/ML IJ SOLN
20.0000 mg | Freq: Every day | INTRAMUSCULAR | Status: DC
  Administered 2020-05-05: 20 mg via INTRAVENOUS
  Filled 2020-05-04: qty 2

## 2020-05-04 MED ORDER — HYDROMORPHONE HCL 2 MG PO TABS
1.0000 mg | ORAL_TABLET | ORAL | Status: DC | PRN
  Administered 2020-05-04: 1 mg via ORAL
  Filled 2020-05-04: qty 1

## 2020-05-04 NOTE — Progress Notes (Signed)
3 Days Post-Op Procedure(s) (LRB): CORONARY ARTERY BYPASS GRAFTING (CABG) X 2 ON CARDIOPULMONARY BYPASS. LIMA TO LAD, SVG TO DIAG (N/A) TRANSESOPHAGEAL ECHOCARDIOGRAM (TEE) (N/A) ENDOVEIN HARVEST OF GREATER SAPHENOUS VEIN (Right) Subjective: Feeling stronger 3 days after CABG Serosanguinous drainage continues from chest tubes with Hb stable at 10.5 c/w fluid overload  Objective: Vital signs in last 24 hours: Temp:  [98.2 F (36.8 C)-98.8 F (37.1 C)] 98.5 F (36.9 C) (12/17 1122) Pulse Rate:  [105-122] 122 (12/17 0700) Cardiac Rhythm: Sinus tachycardia (12/17 0400) Resp:  [19-36] 34 (12/17 0700) BP: (106-148)/(74-96) 128/90 (12/17 0902) SpO2:  [91 %-95 %] 95 % (12/17 0700) Weight:  [75.7 kg] 75.7 kg (12/17 0500)  Hemodynamic parameters for last 24 hours:  sinus tach  Intake/Output from previous day: 12/16 0701 - 12/17 0700 In: 390.6 [P.O.:220; I.V.:170.6] Out: 8828 [Urine:2355; Chest Tube:876] Intake/Output this shift: Total I/O In: 200 [P.O.:200] Out: 2260 [Urine:2150; Chest Tube:110]  Alert and comfortable Lungs clear extremities warm  Abdomen non-distended  Lab Results: Recent Labs    05/03/20 1443 05/04/20 0500  WBC 12.7* 11.3*  HGB 10.6* 10.5*  HCT 30.8* 30.6*  PLT 147* 155   BMET:  Recent Labs    05/03/20 0530 05/04/20 0500  NA 132* 130*  K 3.7 3.5  CL 97* 95*  CO2 26 26  GLUCOSE 110* 103*  BUN 6 <5*  CREATININE 0.58* 0.50*  CALCIUM 8.3* 8.3*    PT/INR:  Recent Labs    05/03/20 0530  LABPROT 15.1  INR 1.2   ABG    Component Value Date/Time   PHART 7.350 05/01/2020 1931   HCO3 22.4 05/01/2020 1931   TCO2 24 05/01/2020 1931   ACIDBASEDEF 3.0 (H) 05/01/2020 1931   O2SAT 53.3 05/02/2020 0451   CBG (last 3)  Recent Labs    05/03/20 2138 05/04/20 0638 05/04/20 1121  GLUCAP 125* 98 118*    Assessment/Plan: S/P Procedure(s) (LRB): CORONARY ARTERY BYPASS GRAFTING (CABG) X 2 ON CARDIOPULMONARY BYPASS. LIMA TO LAD, SVG TO DIAG  (N/A) TRANSESOPHAGEAL ECHOCARDIOGRAM (TEE) (N/A) ENDOVEIN HARVEST OF GREATER SAPHENOUS VEIN (Right) Mobilize Diuresis leave MT and L pleural tube for persistent output   LOS: 3 days    Philip Winters 05/04/2020

## 2020-05-04 NOTE — Discharge Summary (Addendum)
Physician Discharge Summary       [address]       [place] [address]             [phone]    Patient ID: Philip Winters MRN: [id number] DOB/AGE: Apr 01, 1961 59 y.o.  Admit date: 05/01/2020 Discharge date: 05/07/2020  Admission Diagnoses: Coronary artery disease  Discharge Diagnoses:  1. S/P CABG x 2 2. Expected post op blood loss anemia 3. History of hypertension 4. History of high cholesterol 5. History of GERD (gastroesophageal reflux disease) 6. History of sciatica 7. History of sinus tachycardia 8. History of spinal stenosis 9. History of pneumonia 10. History of right carotid bruit 11. History of arthritis 12. History of tobacco abuse, Left lower lobe pulmonary nodule  13 Mild aortic stenosis with tri- leaflet aortic valve Procedure (s):  1.  Coronary artery bypass grafting x2 (left internal mammary artery to left anterior descending, saphenous vein graft to diagonal). 2.  Endoscopic harvest of right leg greater saphenous vein by Dr. Prescott Gum on 12/14/202.  History of Presenting Illness: This is a 59 year old hypertensive reformed smoker with history of dyslipidemia has recent symptoms of exertional angina.  He has known history of cardiac murmur from bicuspid aortic valve.   The patient recently underwent a left heart cath which demonstrated a high-grade proximal LAD stenosis prior to a diagonal branch with a coronary aneurysm.  The dominant circumflex system is open without disease.  The nondominant small and atretic RCA has diffuse disease.   Recent echocardiogram demonstrates calcified thickened bicuspid aortic valve with a calculated peak gradient of 30 mmHg.  Good LV function without significant LVH.  No significant mitral disease. A transvalvular gradient at cath measured a mean gradient of 9-10 mmHg.  He was seen by his family dentist and had dental hygiene and was cleared for aortic valve replacement if needed.  The patient is also had a  CTA of the chest due to his bicuspid valve to assess his ascending aorta which is minimally dilated at 4.1 cm with normal morphology.  He does have a 1.5 cm infiltrative density in the superior segment of the left lower lobe which will need to be followed with further scanning.  Dr. Prescott Gum discussed the need for coronary artery bypass grafting surgery and possible aortic valve replacement surgery. Potential risks, benefits, and complications of the surgery were discussed with the patient and he agreed to proceed with surgery. Pre operative carotid duplex US showed no significant internal carotid artery stenosis bilaterally. Patient underwent a CABG x 2 on 05/01/2020.  Brief Hospital Course:   HPI: This is a 59 year old hypertensive reformed smoker with history of dyslipidemia has recent symptoms of exertional angina.  He has known history of cardiac murmur from bicuspid aortic valve.   The patient recently underwent a left heart cath which demonstrated a high-grade proximal LAD stenosis prior to a diagonal branch with a coronary aneurysm.  The dominant circumflex system is open without disease.  The nondominant small and atretic RCA has diffuse disease.   Recent echocardiogram demonstrates calcified thickened bicuspid aortic valve with a calculated peak gradient of 30 mmHg.  Good LV function without significant LVH.  No significant mitral disease. A transvalvular gradient at cath measured a mean gradient of 9-10 mmHg.  He was seen by his family dentist and had dental hygiene and was cleared for aortic valve replacement if needed.  The patient is also had a CTA of the chest due to his  bicuspid valve to assess his ascending aorta which is minimally dilated at 4.1 cm with normal morphology.  He does have a 1.5 cm infiltrative density in the superior segment of the left lower lobe which will need to be followed with further scanning.  Dr. Prescott Gum discussed the need for coronary artery bypass grafting  surgery and possible aortic valve replacement surgery. Potential risks, benefits, and complications of the surgery were discussed with the patient and he agreed to proceed with surgery. Pre operative carotid duplex US showed no significant internal carotid artery stenosis bilaterally. Patient underwent a CABG x 2 on 05/01/2020.  Hospital Course: Patient was extubated the evening of surgery without difficulty. He did have post op blood loss anemia and required a transfusion. He was weaned off Milrinone and Neo Synephrine drips. [name], a line and foley were removed early in his post operative course. Chest tubes remained for a few days and once output decreased were removed on 12/17.He was later started on Lopressor. He was volume overloaded and diuresed accordingly. He was weaned off the Insulin drip. His pre op HGA1C was 5.8. He was given nutrition information and instructed to follow up with his medical doctor for further surveillance.   He has continued to make progress maintaining stable vital signs with no fevers.  He does have some tachycardia which is chronic for him.  He has been evaluated for this and has been tried on beta-blockers in the past and is intolerant.  He is on Cardizem which we have uptitrated rates are acceptable.  Oxygen has been weaned and he maintains good saturations on room air.  Epicardial pacer wires have been discontinued.  He has a very minor acute blood loss anemia which is stable.  Chest x-ray reveals minor effusions and atelectasis which is improving.  Addendum  12/20  Patient remains in Sinus Tachycardia.  This is a chronic issue for him.  He is ambulating without difficulty.  Surgical incisions are healing without evidence of infection.  He is medically stable for discharge home today.  Latest Vital Signs: Blood pressure 114/73, pulse 83, temperature 98 F (36.7 C), temperature source Oral, resp. rate 20, height 5\' 8"  (1.727 m), weight 68.4 kg, SpO2 93  %.  Physical Exam:  Gen: no apparent distress Heart: RRR, tachy Lungs: CTA bilaterally Abd: soft non-tender, non-distended Ext: no edema present Incisions: healing w/o evidence of infection, + ecchymosis present  Discharge Condition: Stable and discharged to home.  Recent laboratory studies:  Lab Results  Component Value Date   WBC 9.5 05/06/2020   HGB 11.9 (L) 05/06/2020   HCT 34.5 (L) 05/06/2020   MCV 90.8 05/06/2020   PLT 241 05/06/2020   Lab Results  Component Value Date   NA 126 (L) 05/07/2020   K 3.8 05/07/2020   CL 87 (L) 05/07/2020   CO2 28 05/07/2020   CREATININE 0.67 05/07/2020   GLUCOSE 104 (H) 05/07/2020      Diagnostic Studies: DG Chest 2 View  Result Date: 05/06/2020 CLINICAL DATA:  Postop evaluation EXAM: CHEST - 2 VIEW COMPARISON:  Chest radiograph 05/05/2020 FINDINGS: Stable cardiac and mediastinal contours status post median sternotomy and CABG procedure. Monitoring leads overlie the patient. Interval removal left chest tube. Minimal heterogeneous opacities left lung base. Small left pleural effusion. Cannot exclude tiny amount of pleural gas within the left hemithorax inferiorly. Thoracic spine degenerative changes. IMPRESSION: 1. Interval removal left chest tube. Cannot exclude tiny amount of pleural gas within the left hemithorax inferiorly.  2. Small left pleural effusion and underlying opacities favored to represent atelectasis. Electronically Signed   By: Lovey Newcomer M.D.   On: 05/06/2020 09:10   DG Chest 2 View  Result Date: 04/30/2020 CLINICAL DATA:  Cough preop evaluation for upcoming coronary bypass graph EXAM: CHEST - 2 VIEW COMPARISON:  04/25/2020 CT FINDINGS: Cardiac shadow is within normal limits. Aortic calcifications are noted. The lungs are well aerated bilaterally. Previously seen nodular changes are not well appreciated on this exam. Degenerative change of thoracic spine is noted. IMPRESSION: No acute abnormality noted. Electronically  Signed   By: Inez Catalina M.D.   On: 04/30/2020 09:31   CT Chest W Contrast  Result Date: 04/25/2020 CLINICAL DATA:  Aortic disease, history of angina and reported bicuspid aortic valve EXAM: CT CHEST WITH CONTRAST TECHNIQUE: Multidetector CT imaging of the chest was performed during intravenous contrast administration. CONTRAST:  29mL ISOVUE-300 IOPAMIDOL (ISOVUE-300) INJECTION 61% COMPARISON:  None FINDINGS: Cardiovascular: Calcified and noncalcified plaque of the thoracic aorta. Ascending thoracic aorta with dilation to 4.1 cm. Normal caliber of the descending thoracic aorta. Calcification about the aortic valve. Three-vessel coronary artery disease. No pericardial effusion. Central pulmonary vasculature grossly normal, limited assessment based on phase of enhancement. Mediastinum/Nodes: Esophagus unremarkable on CT. No thoracic inlet adenopathy. No axillary lymphadenopathy. No mediastinal lymphadenopathy. Lungs/Pleura: Paraseptal emphysema, mild and worse at the lung apices. Small pulmonary nodule in the RIGHT upper lobe along the minor fissure (image 60, series 8) 4 mm. Part solid nodule measuring 15 x 11 mm in the superior segment of the LEFT lower lobe (image 52 and 51 of series 8) predominantly ground-glass with 3 mm area of more confluent soft tissue at the cephalad margin. Ground-glass nodule LEFT lower lobe (image 67, series 8) 8 x 6 mm Mild scarring in the lingula.  Lungs are otherwise clear. Upper Abdomen: Lobular hepatic contours. Hepatic steatosis. No focal hepatic lesion. The portal vein is patent. Gallbladder is collapsed. No biliary duct dilation. Imaged portions the pancreas and spleen are unremarkable. No upper abdominal adenopathy. Musculoskeletal: Spinal degenerative changes. No acute or destructive bone finding. IMPRESSION: 1. Ascending thoracic aorta with dilation to 4.1 cm. Recommend annual imaging followup by CTA or MRA. This recommendation follows 2010  ACCF/AHA/AATS/ACR/ASA/SCA/SCAI/SIR/STS/SVM Guidelines for the Diagnosis and Management of Patients with Thoracic Aortic Disease. Circulation. 2010; 121: F163-W466. Aortic aneurysm NOS (ICD10-I71.9) 2. Three-vessel coronary artery disease. 3. Sub solid nodule measuring 15 x 11 mm in the superior segment of the LEFT lower lobe. Ground-glass nodule LEFT lower lobe 8 x 6 mm. Area in superior segment of the LEFT upper lobe is the most suspicious. Morphologic characteristics do raise the question of bronchogenic neoplasm. Non-contrast chest CT at 3 months is recommended. If nodules persist and are stable at that time, consider additional non-contrast chest CT examinations at 2 and 4 years. This recommendation follows the consensus statement: Guidelines for Management of Incidental Pulmonary Nodules Detected on CT Images: From the Fleischner Society 2017; Radiology 2017; 284:228-243. Comparison with prior imaging could also be helpful if available. No priors available in the system for this patient showing this area. 4. Small nodule in the RIGHT chest, this could be followed with other nodules described above. 5. Paraseptal emphysema, mild and worse at the lung apices. 6. Lobular hepatic contours, correlate with any clinical or laboratory evidence of liver disease. 7. Emphysema and aortic atherosclerosis. Aortic Atherosclerosis (ICD10-I70.0) and Emphysema (ICD10-J43.9). Electronically Signed   By: Zetta Bills M.D.   On: 04/25/2020 15:42  CARDIAC CATHETERIZATION  Result Date: 04/10/2020  Prox RCA lesion is 80% stenosed.  Prox RCA to Mid RCA lesion is 80% stenosed.  Non-stenotic 1st Diag-1 lesion.  1st Diag-2 lesion is 30% stenosed.  Prox LAD to Mid LAD lesion is 40% stenosed with 40% stenosed side branch in 1st Diag.  Ost LAD to Prox LAD lesion is 20% stenosed.  Prox Cx lesion is 30% stenosed.  There is evidence for coronary calcification.  The LAD has 20% proximal narrowing and then gives rise to a proximal  diagonal vessel which has a focal aneurysm extending from the ostium to the proximal diagonal segment.   In the LAD, at the takeoff of this diagonal aneurysm,  there appears to be a 30 to 40% narrowing immediately proximal and distal to the diagonal vessel.  The diagonal vessel has a 30% narrowing and a sharp bend of the vessel immediately beyond  the distal portion of the aneurysm. The circumflex vessel is a dominant vessel with proximal calcification and narrowing of 30% in the region of the first marginal branch. The RCA is a small nondominant vessel that has proximal and mid 80% stenoses. Calcified aortic valve with mild aortic valve stenosis. Normal right heart pressures with no evidence for pulmonary hypertension. RECOMMENDATION: Angiograms will be reviewed with colleagues.  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  There appears to be mild to moderate disease in the LAD at the aneurysm takeoff as well as just beyond the aneurysm in the diagonal vessel.  Plan initial medical therapy.  The images were reviewed with Dr. Servando Snare.  Patient will follow up with Dr. Marlou Porch.  DG Chest Port 1 View  Result Date: 05/05/2020 CLINICAL DATA:  History of CABG EXAM: PORTABLE CHEST 1 VIEW COMPARISON:  May 04, 2020 FINDINGS: The right-sided chest tube and right IJ sheath have been removed. A left-sided chest tube remains in place. The cardiomediastinal silhouette is stable. No right-sided pneumothorax. There may be a tiny hydropneumothorax seen at the lateral left base. Again, a left chest tube is in place. There is clearly a left-sided pleural effusion with underlying atelectasis. No other interval changes or acute abnormalities. IMPRESSION: 1. There is a probable tiny left hydropneumothorax not seen previously. However, a left chest tube is in place. 2. Right-sided chest tube and right IJ sheath have been removed. No other changes. Electronically Signed   By: Dorise Bullion III M.D   On:  05/05/2020 11:46   DG Chest Port 1 View  Result Date: 05/04/2020 CLINICAL DATA:  Status post cardiac bypass grafting EXAM: PORTABLE CHEST 1 VIEW COMPARISON:  05/03/2020 FINDINGS: Cardiac shadow remains enlarged. Postsurgical changes are again seen. Mediastinal drain and bilateral thoracostomy catheters are noted. No pneumothorax is seen. Right jugular sheath remains in place. Minimal effusions are noted bilaterally. Mild left basilar atelectasis is noted. IMPRESSION: Mild left basilar atelectasis. Tubes and lines as described. Electronically Signed   By: Inez Catalina M.D.   On: 05/04/2020 08:17   DG Chest Port 1 View  Result Date: 05/03/2020 CLINICAL DATA:  Chest tube.  Open-heart surgery. EXAM: PORTABLE CHEST 1 VIEW COMPARISON:  05/02/2020. FINDINGS: Interval removal of Swan-Ganz catheter. Right IJ sheath in stable position. Mediastinal drainage catheter and bilateral chest tubes in stable position. Prior CABG. Stable cardiomegaly. Persistent bibasilar atelectasis. Tiny left pleural effusion cannot be excluded. No pneumothorax. Degenerative change thoracic spine. IMPRESSION: 1. Interval removal of Swan-Ganz catheter. Right IJ sheath in stable position. Mediastinal drainage catheter and bilateral chest tubes  in stable position. No pneumothorax. 2. Prior CABG. Stable cardiomegaly. 3. Persistent mild bibasilar atelectasis. Tiny left pleural effusion cannot be excluded. Electronically Signed   By: Marcello Moores  Register   On: 05/03/2020 08:04   DG Chest Port 1 View  Result Date: 05/02/2020 CLINICAL DATA:  Pneumothorax. EXAM: PORTABLE CHEST 1 VIEW COMPARISON:  May 01, 2020. FINDINGS: Stable cardiomegaly. Status post coronary bypass graft. Bilateral chest tubes are noted without pneumothorax. Endotracheal and nasogastric tubes have been removed. Right internal jugular Swan-Ganz catheter remain. Left basilar atelectasis or infiltrate is noted with small left pleural effusion. Bony thorax is unremarkable.  IMPRESSION: Bilateral chest tubes are noted without pneumothorax. Left basilar atelectasis or infiltrate is noted with small left pleural effusion. Endotracheal and nasogastric tubes have been removed. Electronically Signed   By: Marijo Conception M.D.   On: 05/02/2020 08:05   DG Chest Port 1 View  Result Date: 05/01/2020 CLINICAL DATA:  CABG EXAM: PORTABLE CHEST 1 VIEW COMPARISON:  One-view chest x-ray 04/30/2020 FINDINGS: Heart is enlarged. Patient is now status post median sternotomy for CABG. Tip of the Swan-Ganz catheter terminates in the main pulmonary outflow tract. It enters via a right IJ sheath. Bilateral chest tubes are in place without pneumothorax. Mediastinal drain is noted. Endotracheal tube is in satisfactory position 4 cm above the carina. Side port of the NG tube is just below the GE junction. Lung volumes are low. Small left pleural effusion is present. Mild left basilar atelectasis is present. IMPRESSION: 1. Status post median sternotomy for CABG. 2. Small left pleural effusion and mild left basilar atelectasis. 3. No pneumothorax. 4. Satisfactory positioning of support apparatus. Electronically Signed   By: San Morelle M.D.   On: 05/01/2020 15:36   ECHO INTRAOPERATIVE TEE  Result Date: 05/01/2020  *INTRAOPERATIVE TRANSESOPHAGEAL REPORT *  Patient Name:   Philip Winters Date of Exam: 05/01/2020 Medical Rec #:  [id number]   Height:       68.0 in Accession #:    [id number]  Weight:       157.0 lb Date of Birth:  02-26-1961   BSA:          1.84 m Patient Age:    55 years    BP:           125/74 mmHg Patient Gender: M           HR:           63 bpm. Exam Location:  Inpatient Transesophogeal exam was perform intraoperatively during surgical procedure. Patient was closely monitored under general anesthesia during the entirety of examination. Indications:     coronary artery bypass surgery. Aortic valve replacement. Performing Phys: [place] TRIGT Diagnosing Phys: Lillia Abed MD  Complications: No known complications during this procedure. POST-OP IMPRESSIONS Overall, there were no significant changes from pre-bypass. - Aortic Valve: The aortic valve appears unchanged from pre-bypass. PRE-OP FINDINGS  Left Ventricle: The left ventricle has normal systolic function, with an ejection fraction of 55-60%. The cavity size was normal. There is no increase in left ventricular wall thickness. Right Ventricle: The right ventricle has normal systolic function. The cavity was normal. There is no increase in right ventricular wall thickness. Left Atrium: Left atrial size was normal in size. The left atrial appendage is well visualized and there is no evidence of thrombus present. Right Atrium: Right atrial size was normal in size. Interatrial Septum: No atrial level shunt detected by color flow Doppler. Pericardium: There is no  evidence of pericardial effusion. Mitral Valve: The mitral valve is normal in structure. No thickening of the mitral valve leaflet. No calcification of the mitral valve leaflet. Mitral valve regurgitation is not visualized by color flow Doppler. There is No evidence of mitral stenosis. Tricuspid Valve: The tricuspid valve was normal in structure. Tricuspid valve regurgitation is trivial by color flow Doppler. Aortic Valve: The aortic valve is tricuspid There is moderate thickening of the aortic valve and There is moderate calcification of the aortic valve Aortic valve regurgitation is mild by color flow Doppler. There is no evidence of aortic valve stenosis. Pulmonic Valve: The pulmonic valve was normal in structure. Pulmonic valve regurgitation is not visualized by color flow Doppler. Aorta: The aortic root and aortic arch are normal in size and structure. +------------------+--------++ AORTIC VALVE               +------------------+--------++ AV Area (Vmax):            +------------------+--------++ AV Area (Vmean):           +------------------+--------++ AV Area  (VTI):             +------------------+--------++ AV Vmax:                   +------------------+--------++ AV Vmean:                  +------------------+--------++ AV VTI:                    +------------------+--------++ AV Peak Grad:              +------------------+--------++ AV Mean Grad:     5.0 mmHg +------------------+--------++ LVOT Vmax:                 +------------------+--------++ LVOT Vmean:                +------------------+--------++ LVOT VTI:                  +------------------+--------++ LVOT/AV VTI ratio:         +------------------+--------++ AR PHT:                    +------------------+--------++  Lillia Abed MD Electronically signed by Lillia Abed MD Signature Date/Time: 05/01/2020/5:43:20 PM    Final    VAS US DOPPLER PRE CABG  Result Date: 04/30/2020 PREOPERATIVE VASCULAR EVALUATION  Indications:     Pre-CABG. Risk Factors:    Hyperlipidemia, past history of smoking, coronary artery                  disease. Other Factors:   Aortic valve disease. Comparison       Prior study from 08/02/19, showing 1-39% ICA stenosis, is Study:           available Performing Technologist: Sharion Dove RVS  Examination Guidelines: A complete evaluation includes B-mode imaging, spectral Doppler, color Doppler, and power Doppler as needed of all accessible portions of each vessel. Bilateral testing is considered an integral part of a complete examination. Limited examinations for reoccurring indications may be performed as noted.  Right Carotid Findings: +----------+--------+--------+--------+------------+--------+           PSV cm/sEDV cm/sStenosisDescribe    Comments +----------+--------+--------+--------+------------+--------+ CCA Prox  95      17              calcific             +----------+--------+--------+--------+------------+--------+ CCA Distal80  13              calcific              +----------+--------+--------+--------+------------+--------+ ICA Prox  56      16              heterogenous         +----------+--------+--------+--------+------------+--------+ ICA Distal82      24                                   +----------+--------+--------+--------+------------+--------+ ECA       108     13                                   +----------+--------+--------+--------+------------+--------+ Portions of this table do not appear on this page. +----------+--------+-------+--------+------------+           PSV cm/sEDV cmsDescribeArm Pressure +----------+--------+-------+--------+------------+ Subclavian58                                  +----------+--------+-------+--------+------------+ +---------+--------+--+--------+--+ VertebralPSV cm/s42EDV cm/s10 +---------+--------+--+--------+--+ Left Carotid Findings: +----------+--------+--------+--------+--------+--------+           PSV cm/sEDV cm/sStenosisDescribeComments +----------+--------+--------+--------+--------+--------+ CCA Prox  100     20              calcific         +----------+--------+--------+--------+--------+--------+ CCA Distal72      22              calcific         +----------+--------+--------+--------+--------+--------+ ICA Prox  90      26              calcific         +----------+--------+--------+--------+--------+--------+ ICA Distal60      21                               +----------+--------+--------+--------+--------+--------+ ECA       148     19                               +----------+--------+--------+--------+--------+--------+ +----------+--------+--------+--------+------------+ SubclavianPSV cm/sEDV cm/sDescribeArm Pressure +----------+--------+--------+--------+------------+           145                                  +----------+--------+--------+--------+------------+ +---------+--------+--+--------+--+ VertebralPSV  cm/s64EDV cm/s26 +---------+--------+--+--------+--+  ABI Findings: +--------+------------------+-----+-----------+--------+ Right   Rt Pressure (mmHg)IndexWaveform   Comment  +--------+------------------+-----+-----------+--------+ Brachial130                    triphasic           +--------+------------------+-----+-----------+--------+ PTA                            multiphasic         +--------+------------------+-----+-----------+--------+ DP                             multiphasic         +--------+------------------+-----+-----------+--------+ +--------+------------------+-----+-----------+-------+ Left  Lt Pressure (mmHg)IndexWaveform   Comment +--------+------------------+-----+-----------+-------+ Brachial                       triphasic          +--------+------------------+-----+-----------+-------+ PTA                            multiphasic        +--------+------------------+-----+-----------+-------+ DP                             multiphasic        +--------+------------------+-----+-----------+-------+  Right Doppler Findings: +-----------+--------+-----+-----------+---------------------------------------+ Site       PressureIndexDoppler    Comments                                +-----------+--------+-----+-----------+---------------------------------------+ Brachial   130          triphasic                                          +-----------+--------+-----+-----------+---------------------------------------+ Radial                  multiphasic                                        +-----------+--------+-----+-----------+---------------------------------------+ Ulnar                   multiphasic                                        +-----------+--------+-----+-----------+---------------------------------------+ Palmar Arch                        Doppler signal reverses with radial                                         compression                             +-----------+--------+-----+-----------+---------------------------------------+  Left Doppler Findings: +--------+--------+-----+-----------+--------+ Site    PressureIndexDoppler    Comments +--------+--------+-----+-----------+--------+ Brachial             triphasic           +--------+--------+-----+-----------+--------+ Radial               multiphasic         +--------+--------+-----+-----------+--------+ Ulnar                multiphasic         +--------+--------+-----+-----------+--------+  Summary: Right Carotid: Velocities in the right ICA are consistent with a 1-39% stenosis. Left Carotid: Velocities in the left ICA are consistent with a 1-39% stenosis. Vertebrals:  Bilateral vertebral arteries demonstrate antegrade flow. Subclavians: Normal flow hemodynamics were seen in bilateral subclavian              arteries. Right Upper Extremity: Doppler waveforms remain within normal limits with  right radial compression. Doppler waveforms decrease 50% with right ulnar compression. Left Upper Extremity: Doppler waveforms remain within normal limits with left radial compression. Doppler waveforms decrease 50% with left ulnar compression.  Electronically signed by Ruta Hinds MD on 04/30/2020 at 7:40:38 PM.    Final    [name] EKG SITE RITE  Result Date: 05/03/2020 If Site Rite image not attached, placement could not be confirmed due to current cardiac rhythm.         Discharge Medications: Allergies as of 05/07/2020       Reactions   Mushroom Extract Complex Anaphylaxis   Penicillins Shortness Of Breath   Childhood allergy.  Has patient had a PCN reaction causing immediate rash, facial/tongue/throat swelling, SOB or lightheadedness with hypotension: No Has patient had a PCN reaction causing severe rash involving mucus membranes or skin necrosis: No Has patient had a PCN reaction that required hospitalization No Has  patient had a PCN reaction occurring within the last 10 years: No If all of the above answers are "NO", then may proceed with C   Atorvastatin    myalgia   Chantix [varenicline]    Other reaction(s): headaches   Dilaudid [hydromorphone] Nausea And Vomiting   Pravastatin    myalgia   Rosuvastatin    myalgia   Bystolic [nebivolol Hcl] Other (See Comments)   Extreme fatigue, not able to focus   Codeine Nausea And Vomiting   Iodine Rash   Metoprolol Other (See Comments)   Made patient very fatigued, could not focus        Medication List     STOP taking these medications    Evolocumab 140 MG/ML Soaj   nitroGLYCERIN 0.4 MG SL tablet Commonly known as: NITROSTAT   olmesartan 40 MG tablet Commonly known as: BENICAR   predniSONE 50 MG tablet Commonly known as: DELTASONE       TAKE these medications    aspirin 325 MG EC tablet Take 1 tablet (325 mg total) by mouth daily. What changed:  medication strength how much to take   clindamycin 300 MG capsule Commonly known as: CLEOCIN Take 2 tablets 1 hour before dental procedure What changed:  how much to take how to take this when to take this additional instructions   diclofenac Sodium 1 % Gel Commonly known as: VOLTAREN Apply 2 g topically daily as needed (Knee and back pain).   diltiazem 240 MG 24 hr capsule Commonly known as: CARDIZEM CD Take 1 capsule (240 mg total) by mouth daily.   diphenhydrAMINE 25 MG tablet Commonly known as: BENADRYL Take 12.5 mg by mouth at bedtime.   ferrous [id number] C-folic acid capsule Commonly known as: TRINSICON / FOLTRIN Take 1 capsule by mouth 2 (two) times daily after a meal.   fluticasone 50 MCG/ACT nasal spray Commonly known as: FLONASE Place 2 sprays into both nostrils daily as needed for allergies.   LORazepam 0.5 MG tablet Commonly known as: ATIVAN Take 0.5 mg by mouth daily as needed for anxiety.   multivitamin with minerals Tabs tablet Take 1  tablet by mouth daily.   tetrahydrozoline 0.05 % ophthalmic solution Place 1 drop into both eyes 3 (three) times daily as needed (for dry/irritated eyes.).   traMADol 50 MG tablet Commonly known as: ULTRAM Take 1 tablet (50 mg total) by mouth every 6 (six) hours as needed for up to 7 days for moderate pain. What changed:  how much to take when to take this reasons to take this  The patient has been discharged on:   1.Beta Blocker:  Yes [   ]                              No   [ x  ]                              If No, reason: intolerance  2.Ace Inhibitor/ARB: Yes [x   ]                                     No  [    ]                                     If No, reason:  3.Statin:   Yes [   ]                  No  [ x  ]                  If No, reason: intolerance  4.Shela Commons:  Yes  [  x ]                  No   [   ]                  If No, reason:  Follow Up Appointments:  Follow-up Information     Jerline Pain, MD. Go on 05/21/2020.   Specialty: Cardiology Why: Appointment time is at 8:00 am Contact information: [address] [place] [address] [phone]         Prescott Gum, Collier Salina, MD. Go on 05/30/2020.   Specialty: Cardiothoracic Surgery Why: PA/LAT CXR to be taken (at [facility] which is in the same building as Dr. Lucianne Lei Trigt['s office) on 05/30/2020 at 10:30 am;Appointment time is at 11:00 am Contact information: [address] [place] [address] [phone]                The patient will need a followup CT chest in April 2022 to followup a 1.5 cm LLL nodule - incidental finding on preop CT chest  Signed:   Note by Jadene Pierini PA-C  Addendum/Exam by: Ellamae Sia 05/07/2020, 7:19 AM

## 2020-05-04 NOTE — Progress Notes (Signed)
Patient ID: Philip Winters, male   DOB: [birth year], 60 y.o.   MRN: [id number] EVENING ROUNDS NOTE :     [address]       [place] [address]             [phone]                 3 Days Post-Op Procedure(s) (LRB): CORONARY ARTERY BYPASS GRAFTING (CABG) X 2 ON CARDIOPULMONARY BYPASS. LIMA TO LAD, SVG TO DIAG (N/A) TRANSESOPHAGEAL ECHOCARDIOGRAM (TEE) (N/A) ENDOVEIN HARVEST OF GREATER SAPHENOUS VEIN (Right)  Total Length of Stay:  LOS: 3 days  BP 117/86   Pulse (!) 109   Temp 98.5 F (36.9 C)   Resp (!) 29   Ht 5\' 8"  (1.727 m)   Wt 75.7 kg   SpO2 92%   BMI 25.38 kg/m   .Intake/Output      12/16 0701 12/17 0700 12/17 0701 12/18 0700   P.O. 220 400   I.V. (mL/kg) 170.6 (2.3)    Blood     IV Piggyback     Total Intake(mL/kg) 390.6 (5.2) 400 (5.3)   Urine (mL/kg/hr) 2355 (1.3) 3525 (5)   Chest Tube 876 140   Total Output 3231 3665   Net -2840.4 -3265          . phenylephrine (NEO-SYNEPHRINE) Adult infusion Stopped (05/03/20 1636)     Lab Results  Component Value Date   WBC 11.3 (H) 05/04/2020   HGB 10.5 (L) 05/04/2020   HCT 30.6 (L) 05/04/2020   PLT 155 05/04/2020   GLUCOSE 103 (H) 05/04/2020   ALT 23 04/30/2020   AST 27 04/30/2020   NA 130 (L) 05/04/2020   K 3.5 05/04/2020   CL 95 (L) 05/04/2020   CREATININE 0.50 (L) 05/04/2020   BUN <5 (L) 05/04/2020   CO2 26 05/04/2020   INR 1.2 05/03/2020   HGBA1C 5.8 (H) 04/30/2020   Stable day    Grace Isaac MD  Beeper [phone] Office [phone] 05/04/2020 4:24 PM

## 2020-05-05 ENCOUNTER — Inpatient Hospital Stay (HOSPITAL_COMMUNITY): Payer: BC Managed Care – PPO

## 2020-05-05 LAB — BPAM RBC
Blood Product Expiration Date: 202112282359
Blood Product Expiration Date: 202112282359
Blood Product Expiration Date: 202112292359
Blood Product Expiration Date: 202112302359
Blood Product Expiration Date: 202201102359
Blood Product Expiration Date: 202201132359
ISSUE DATE / TIME: 202112141840
ISSUE DATE / TIME: 202112150752
ISSUE DATE / TIME: 202112152215
ISSUE DATE / TIME: 202112152215
Unit Type and Rh: 600
Unit Type and Rh: 600
Unit Type and Rh: 600
Unit Type and Rh: 600
Unit Type and Rh: 600
Unit Type and Rh: 600

## 2020-05-05 LAB — TYPE AND SCREEN
ABO/RH(D): A NEG
Antibody Screen: NEGATIVE
Unit division: 0
Unit division: 0
Unit division: 0
Unit division: 0
Unit division: 0
Unit division: 0

## 2020-05-05 LAB — GLUCOSE, CAPILLARY
Glucose-Capillary: 118 mg/dL — ABNORMAL HIGH (ref 70–99)
Glucose-Capillary: 129 mg/dL — ABNORMAL HIGH (ref 70–99)
Glucose-Capillary: 126 mg/dL — ABNORMAL HIGH (ref 70–99)
Glucose-Capillary: 119 mg/dL — ABNORMAL HIGH (ref 70–99)

## 2020-05-05 LAB — CBC
HCT: 35 % — ABNORMAL LOW (ref 39.0–52.0)
Hemoglobin: 12.3 g/dL — ABNORMAL LOW (ref 13.0–17.0)
MCH: 32 pg (ref 26.0–34.0)
MCHC: 35.1 g/dL (ref 30.0–36.0)
MCV: 91.1 fL (ref 80.0–100.0)
Platelets: 222 10*3/uL (ref 150–400)
RBC: 3.84 MIL/uL — ABNORMAL LOW (ref 4.22–5.81)
RDW: 15.3 % (ref 11.5–15.5)
WBC: 9.9 10*3/uL (ref 4.0–10.5)
nRBC: 0.5 % — ABNORMAL HIGH (ref 0.0–0.2)

## 2020-05-05 LAB — BASIC METABOLIC PANEL
Anion gap: 9 (ref 5–15)
BUN: <5 mg/dL — ABNORMAL LOW (ref 6–20)
CO2: 32 mmol/L (ref 22–32)
Calcium: 9.3 mg/dL (ref 8.9–10.3)
Chloride: 90 mmol/L — ABNORMAL LOW (ref 98–111)
Creatinine, Ser: 0.61 mg/dL (ref 0.61–1.24)
GFR, Estimated: >60 mL/min (ref >60)
Glucose, Bld: 117 mg/dL — ABNORMAL HIGH (ref 70–99)
Potassium: 4 mmol/L (ref 3.5–5.1)
Sodium: 131 mmol/L — ABNORMAL LOW (ref 135–145)

## 2020-05-05 IMAGING — DX DG CHEST 1V PORT
1 series · 1 of 1 positions shown · non-contrast
Comparison: [DATE]

CLINICAL DATA: History of CABG

EXAM:
PORTABLE CHEST 1 VIEW

[chest]
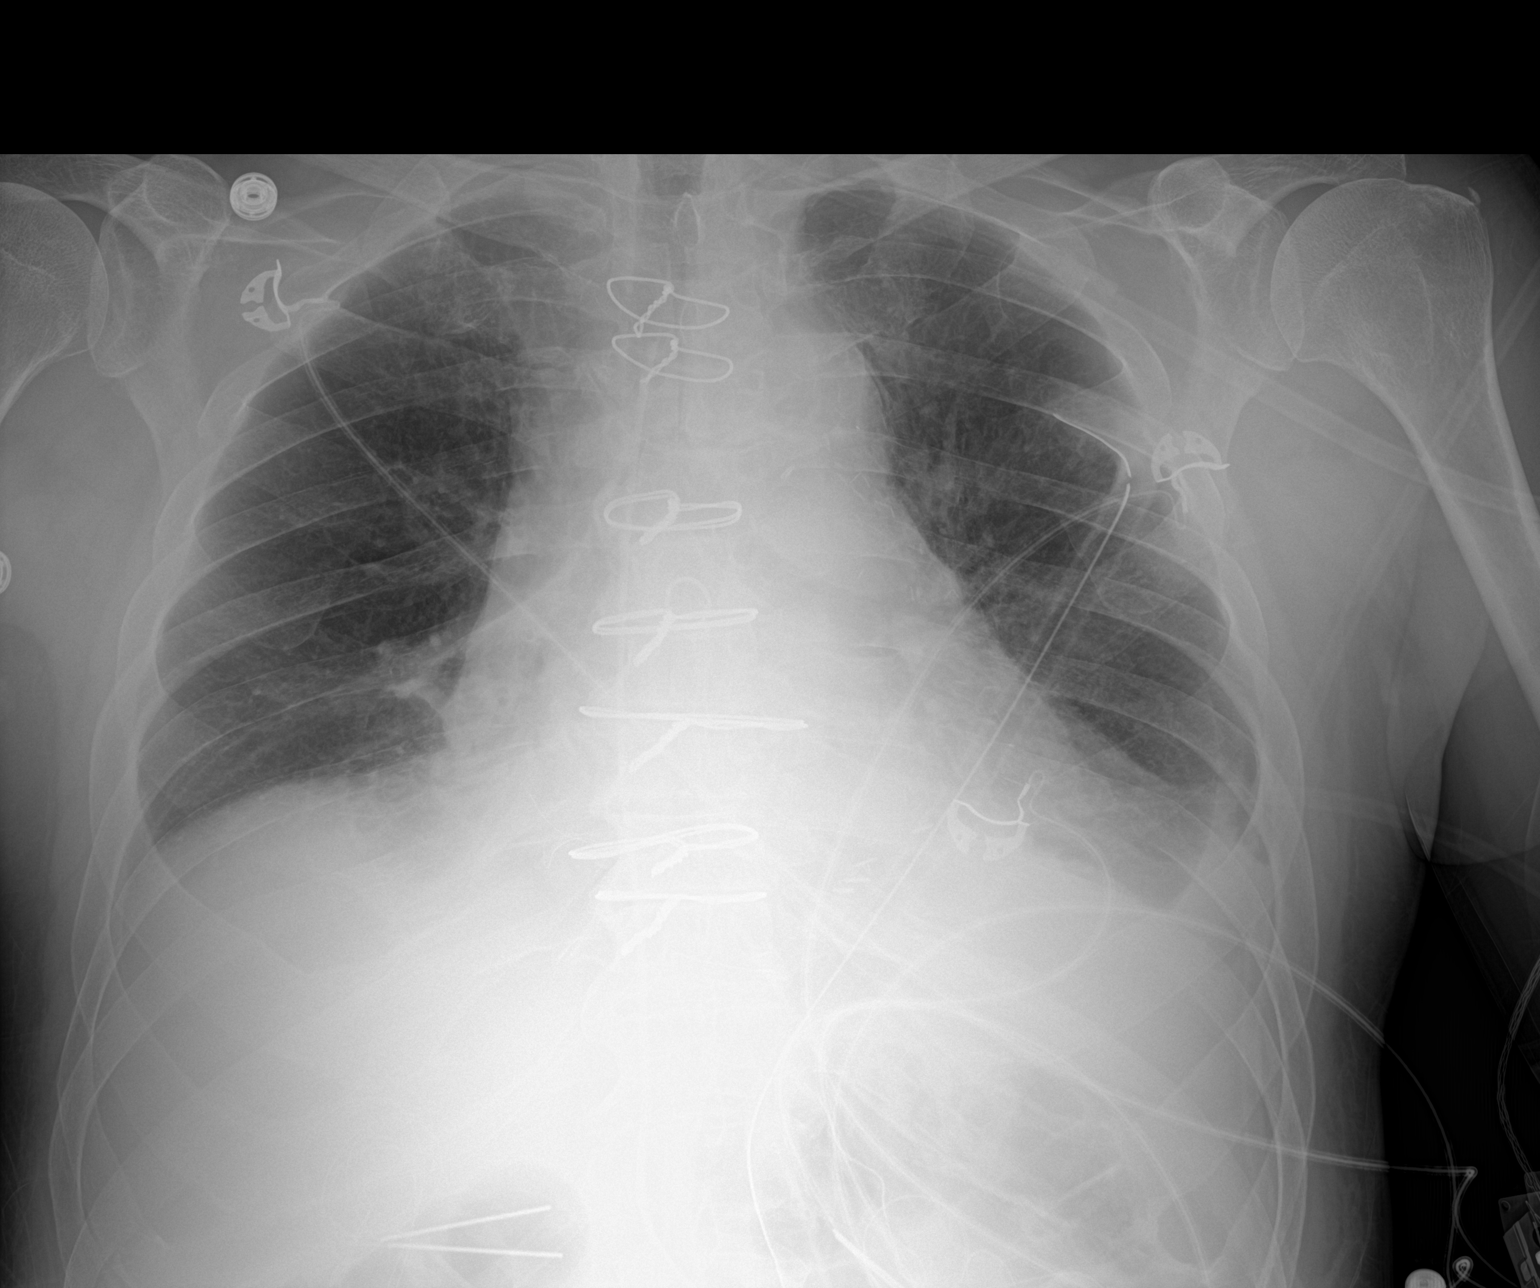

[1 of 1 positions shown; findings below may reference images not displayed]

FINDINGS: The right-sided chest tube and right IJ sheath have been removed. A
left-sided chest tube remains in place. The cardiomediastinal
silhouette is stable. No right-sided pneumothorax. There may be a
tiny hydropneumothorax seen at the lateral left base. Again, a left
chest tube is in place. There is clearly a left-sided pleural
effusion with underlying atelectasis. No other interval changes or
acute abnormalities.
IMPRESSION: 1. There is a probable tiny left hydropneumothorax not seen
previously. However, a left chest tube is in place.
2. Right-sided chest tube and right IJ sheath have been removed. No
other changes.

## 2020-05-05 MED ORDER — TRAMADOL HCL 50 MG PO TABS
50.0000 mg | ORAL_TABLET | ORAL | Status: DC | PRN
Start: 2020-05-05 — End: 2020-05-07
  Administered 2020-05-05: 100 mg via ORAL
  Administered 2020-05-06 (×2): 50 mg via ORAL
  Filled 2020-05-05 (×2): qty 1
  Filled 2020-05-05: qty 2

## 2020-05-05 MED ORDER — ~~LOC~~ CARDIAC SURGERY, PATIENT & FAMILY EDUCATION: Freq: Once | Status: AC

## 2020-05-05 MED ORDER — DOCUSATE SODIUM 100 MG PO CAPS
200.0000 mg | ORAL_CAPSULE | Freq: Every day | ORAL | Status: DC
  Administered 2020-05-06: 200 mg via ORAL
  Filled 2020-05-05 (×2): qty 2

## 2020-05-05 MED ORDER — MAGNESIUM HYDROXIDE 400 MG/5ML PO SUSP
30.0000 mL | Freq: Every day | ORAL | Status: DC | PRN
  Administered 2020-05-05 – 2020-05-06 (×2): 30 mL via ORAL
  Filled 2020-05-05 (×2): qty 30

## 2020-05-05 MED ORDER — ONDANSETRON HCL 4 MG/2ML IJ SOLN: 4.0000 mg | Freq: Four times a day (QID) | INTRAMUSCULAR | Status: DC | PRN

## 2020-05-05 MED ORDER — ONDANSETRON HCL 4 MG PO TABS: 4.0000 mg | ORAL_TABLET | Freq: Four times a day (QID) | ORAL | Status: DC | PRN

## 2020-05-05 MED ORDER — ASPIRIN EC 325 MG PO TBEC
325.0000 mg | DELAYED_RELEASE_TABLET | Freq: Every day | ORAL | Status: DC
  Administered 2020-05-06 – 2020-05-07 (×2): 325 mg via ORAL
  Filled 2020-05-05 (×2): qty 1

## 2020-05-05 MED ORDER — PANTOPRAZOLE SODIUM 40 MG PO TBEC
40.0000 mg | DELAYED_RELEASE_TABLET | Freq: Every day | ORAL | Status: DC
  Administered 2020-05-06 – 2020-05-07 (×2): 40 mg via ORAL
  Filled 2020-05-05 (×2): qty 1

## 2020-05-05 MED ORDER — BISACODYL 5 MG PO TBEC: 10.0000 mg | DELAYED_RELEASE_TABLET | Freq: Every day | ORAL | Status: DC | PRN

## 2020-05-05 MED ORDER — ALPRAZOLAM 0.5 MG PO TABS
0.5000 mg | ORAL_TABLET | Freq: Three times a day (TID) | ORAL | Status: DC | PRN
  Administered 2020-05-05 – 2020-05-07 (×5): 0.5 mg via ORAL
  Filled 2020-05-05 (×5): qty 1

## 2020-05-05 MED ORDER — INSULIN ASPART 100 UNIT/ML ~~LOC~~ SOLN
0.0000 [IU] | Freq: Three times a day (TID) | SUBCUTANEOUS | Status: DC
  Administered 2020-05-05 – 2020-05-06 (×2): 1 [IU] via SUBCUTANEOUS

## 2020-05-05 MED ORDER — BISACODYL 10 MG RE SUPP: 10.0000 mg | Freq: Every day | RECTAL | Status: DC | PRN

## 2020-05-05 NOTE — Progress Notes (Signed)
Patient ID: Philip Winters, male   DOB: [birth year], 60 y.o.   MRN: [id number] TCTS DAILY ICU PROGRESS NOTE                   [address]            [place] [address]          [phone]   4 Days Post-Op Procedure(s) (LRB): CORONARY ARTERY BYPASS GRAFTING (CABG) X 2 ON CARDIOPULMONARY BYPASS. LIMA TO LAD, SVG TO DIAG (N/A) TRANSESOPHAGEAL ECHOCARDIOGRAM (TEE) (N/A) ENDOVEIN HARVEST OF GREATER SAPHENOUS VEIN (Right)  Total Length of Stay:  LOS: 4 days   Subjective: Patient awake alert neurologically intact sitting in chair  Objective: Vital signs in last 24 hours: Temp:  [96.1 F (35.6 C)-98.5 F (36.9 C)] 96.1 F (35.6 C) (12/18 0601) Pulse Rate:  [98-128] 110 (12/18 0700) Cardiac Rhythm: Sinus tachycardia (12/18 0700) Resp:  [16-33] 32 (12/18 0700) BP: (97-153)/(58-104) 98/58 (12/18 0600) SpO2:  [91 %-96 %] 92 % (12/18 0700) Weight:  [71.5 kg] 71.5 kg (12/18 0500)  Filed Weights   05/02/20 0700 05/04/20 0500 05/05/20 0500  Weight: 75.8 kg 75.7 kg 71.5 kg    Weight change: -4.2 kg   Hemodynamic parameters for last 24 hours:    Intake/Output from previous day: 12/17 0701 - 12/18 0700 In: 600 [P.O.:600] Out: 5340 [Urine:5100; Chest Tube:240]  Intake/Output this shift: No intake/output data recorded.  Current Meds: Scheduled Meds: . acetaminophen  1,000 mg Oral Q6H   Or  . acetaminophen (TYLENOL) oral liquid 160 mg/5 mL  1,000 mg Per Tube Q6H  . amiodarone  200 mg Oral BID  . benzonatate  100 mg Oral TID  . bisacodyl  10 mg Oral Daily   Or  . bisacodyl  10 mg Rectal Daily  . Chlorhexidine Gluconate Cloth  6 each Topical Daily  . diltiazem  120 mg Oral Q12H  . docusate sodium  200 mg Oral Daily  . ferrous [id number] C-folic acid  1 capsule Oral BID PC  . furosemide  20 mg Intravenous Daily  . guaiFENesin  600 mg Oral BID  . insulin aspart  0-24 Units Subcutaneous TID AC & HS  . mouth rinse  15 mL Mouth Rinse BID  . metolazone  5  mg Oral Daily  . mometasone-formoterol  2 puff Inhalation BID  . pantoprazole  40 mg Oral Daily  . potassium chloride  20 mEq Oral BID  . sodium chloride flush  10-40 mL Intracatheter Q12H  . sodium chloride flush  3 mL Intravenous Q12H   Continuous Infusions: . phenylephrine (NEO-SYNEPHRINE) Adult infusion Stopped (05/03/20 1636)   PRN Meds:.ALPRAZolam, HYDROmorphone, midazolam, morphine injection, ondansetron (ZOFRAN) IV, phenol, sodium chloride flush, sodium chloride flush, traMADol  General appearance: alert, cooperative and no distress Neurologic: intact Heart: regular rate and rhythm, S1, S2 normal, no murmur, click, rub or gallop Lungs: diminished breath sounds bibasilar Abdomen: soft, non-tender; bowel sounds normal; no masses,  no organomegaly Extremities: extremities normal, atraumatic, no cyanosis or edema and Homans sign is negative, no sign of DVT Wound: Sternum is stable healing well  Lab Results: CBC: Recent Labs    05/04/20 0500 05/05/20 0102  WBC 11.3* 9.9  HGB 10.5* 12.3*  HCT 30.6* 35.0*  PLT 155 222   BMET:  Recent Labs    05/04/20 0500 05/05/20 0102  NA 130* 131*  K 3.5 4.0  CL 95* 90*  CO2 26 32  GLUCOSE 103* 117*  BUN <5* <5*  CREATININE 0.50* 0.61  CALCIUM 8.3* 9.3    CMET: Lab Results  Component Value Date   WBC 9.9 05/05/2020   HGB 12.3 (L) 05/05/2020   HCT 35.0 (L) 05/05/2020   PLT 222 05/05/2020   GLUCOSE 117 (H) 05/05/2020   ALT 23 04/30/2020   AST 27 04/30/2020   NA 131 (L) 05/05/2020   K 4.0 05/05/2020   CL 90 (L) 05/05/2020   CREATININE 0.61 05/05/2020   BUN <5 (L) 05/05/2020   CO2 32 05/05/2020   INR 1.2 05/03/2020   HGBA1C 5.8 (H) 04/30/2020      PT/INR:  Recent Labs    05/03/20 0530  LABPROT 15.1  INR 1.2   Radiology: No results found.   Assessment/Plan: S/P Procedure(s) (LRB): CORONARY ARTERY BYPASS GRAFTING (CABG) X 2 ON CARDIOPULMONARY BYPASS. LIMA TO LAD, SVG TO DIAG (N/A) TRANSESOPHAGEAL  ECHOCARDIOGRAM (TEE) (N/A) ENDOVEIN HARVEST OF GREATER SAPHENOUS VEIN (Right) Mobilize Diuresis d/c tubes/lines     Grace Isaac 05/05/2020 8:03 AM

## 2020-05-05 NOTE — Progress Notes (Signed)
Pt arrived to rm 09 from [place] with 4L 02. Initiated tele. VSS. Pt alert. Assessment and CHG wipe performed. Call bell within reach.  Lavenia Atlas, RN

## 2020-05-05 NOTE — Progress Notes (Signed)
Was able to wean pt from 4L to 2L 02. Pt's 02 sat is 94% at 2L 02. Pt tolerated well.   Lavenia Atlas, RN

## 2020-05-06 ENCOUNTER — Inpatient Hospital Stay (HOSPITAL_COMMUNITY): Payer: BC Managed Care – PPO

## 2020-05-06 LAB — BASIC METABOLIC PANEL
Anion gap: 13 (ref 5–15)
BUN: 7 mg/dL (ref 6–20)
CO2: 30 mmol/L (ref 22–32)
Calcium: 9.1 mg/dL (ref 8.9–10.3)
Chloride: 87 mmol/L — ABNORMAL LOW (ref 98–111)
Creatinine, Ser: 0.62 mg/dL (ref 0.61–1.24)
GFR, Estimated: >60 mL/min (ref >60)
Glucose, Bld: 117 mg/dL — ABNORMAL HIGH (ref 70–99)
Potassium: 3.1 mmol/L — ABNORMAL LOW (ref 3.5–5.1)
Sodium: 130 mmol/L — ABNORMAL LOW (ref 135–145)

## 2020-05-06 LAB — GLUCOSE, CAPILLARY
Glucose-Capillary: 112 mg/dL — ABNORMAL HIGH (ref 70–99)
Glucose-Capillary: 125 mg/dL — ABNORMAL HIGH (ref 70–99)
Glucose-Capillary: 118 mg/dL — ABNORMAL HIGH (ref 70–99)
Glucose-Capillary: 110 mg/dL — ABNORMAL HIGH (ref 70–99)

## 2020-05-06 LAB — CBC
HCT: 34.5 % — ABNORMAL LOW (ref 39.0–52.0)
Hemoglobin: 11.9 g/dL — ABNORMAL LOW (ref 13.0–17.0)
MCH: 31.3 pg (ref 26.0–34.0)
MCHC: 34.5 g/dL (ref 30.0–36.0)
MCV: 90.8 fL (ref 80.0–100.0)
Platelets: 241 10*3/uL (ref 150–400)
RBC: 3.8 MIL/uL — ABNORMAL LOW (ref 4.22–5.81)
RDW: 14.6 % (ref 11.5–15.5)
WBC: 9.5 10*3/uL (ref 4.0–10.5)
nRBC: 0 % (ref 0.0–0.2)

## 2020-05-06 IMAGING — CR DG CHEST 2V
2 series · 2 of 2 positions shown · non-contrast
Comparison: Chest radiograph [DATE]

CLINICAL DATA: Postop evaluation

EXAM:
CHEST - 2 VIEW

[chest pa]
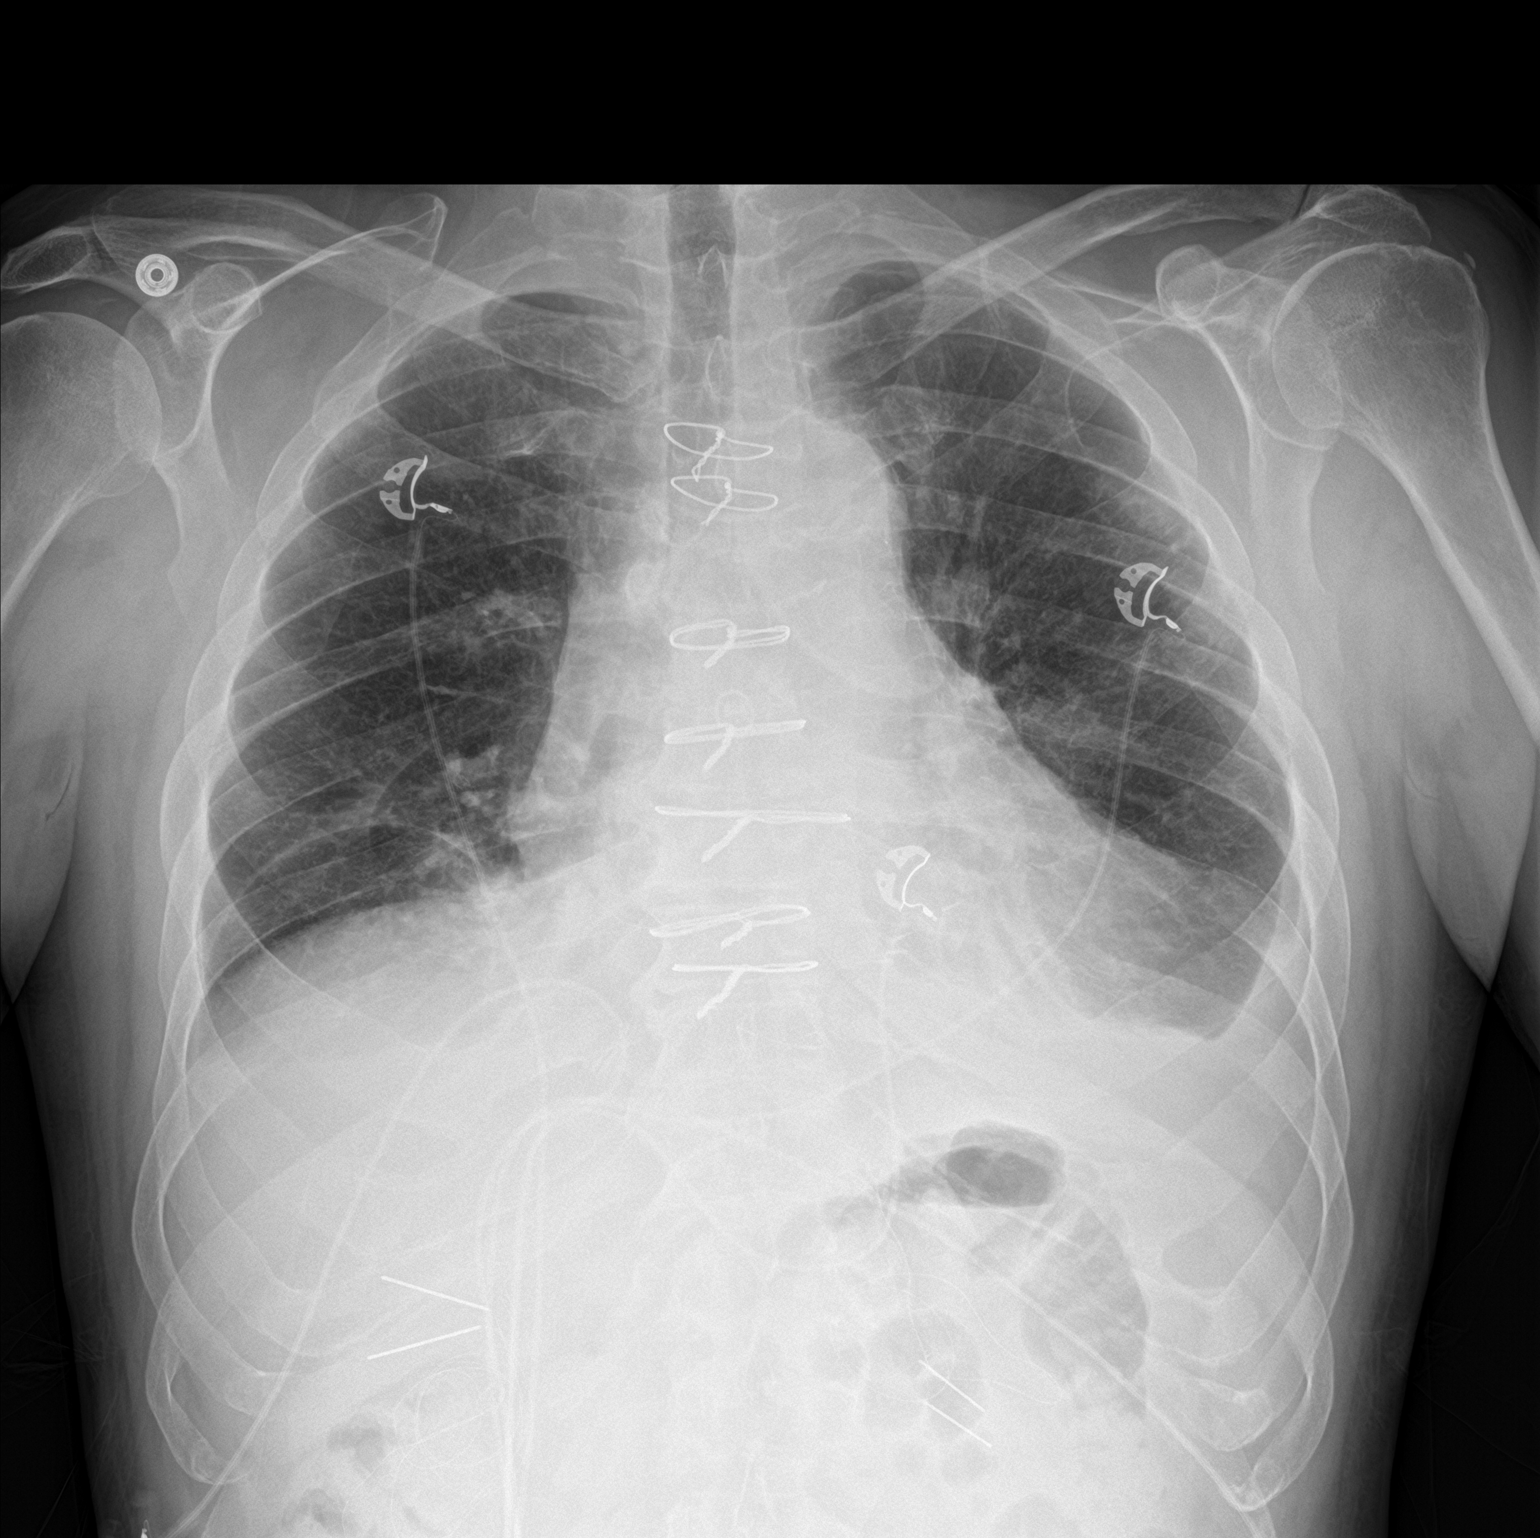

[chest lat]
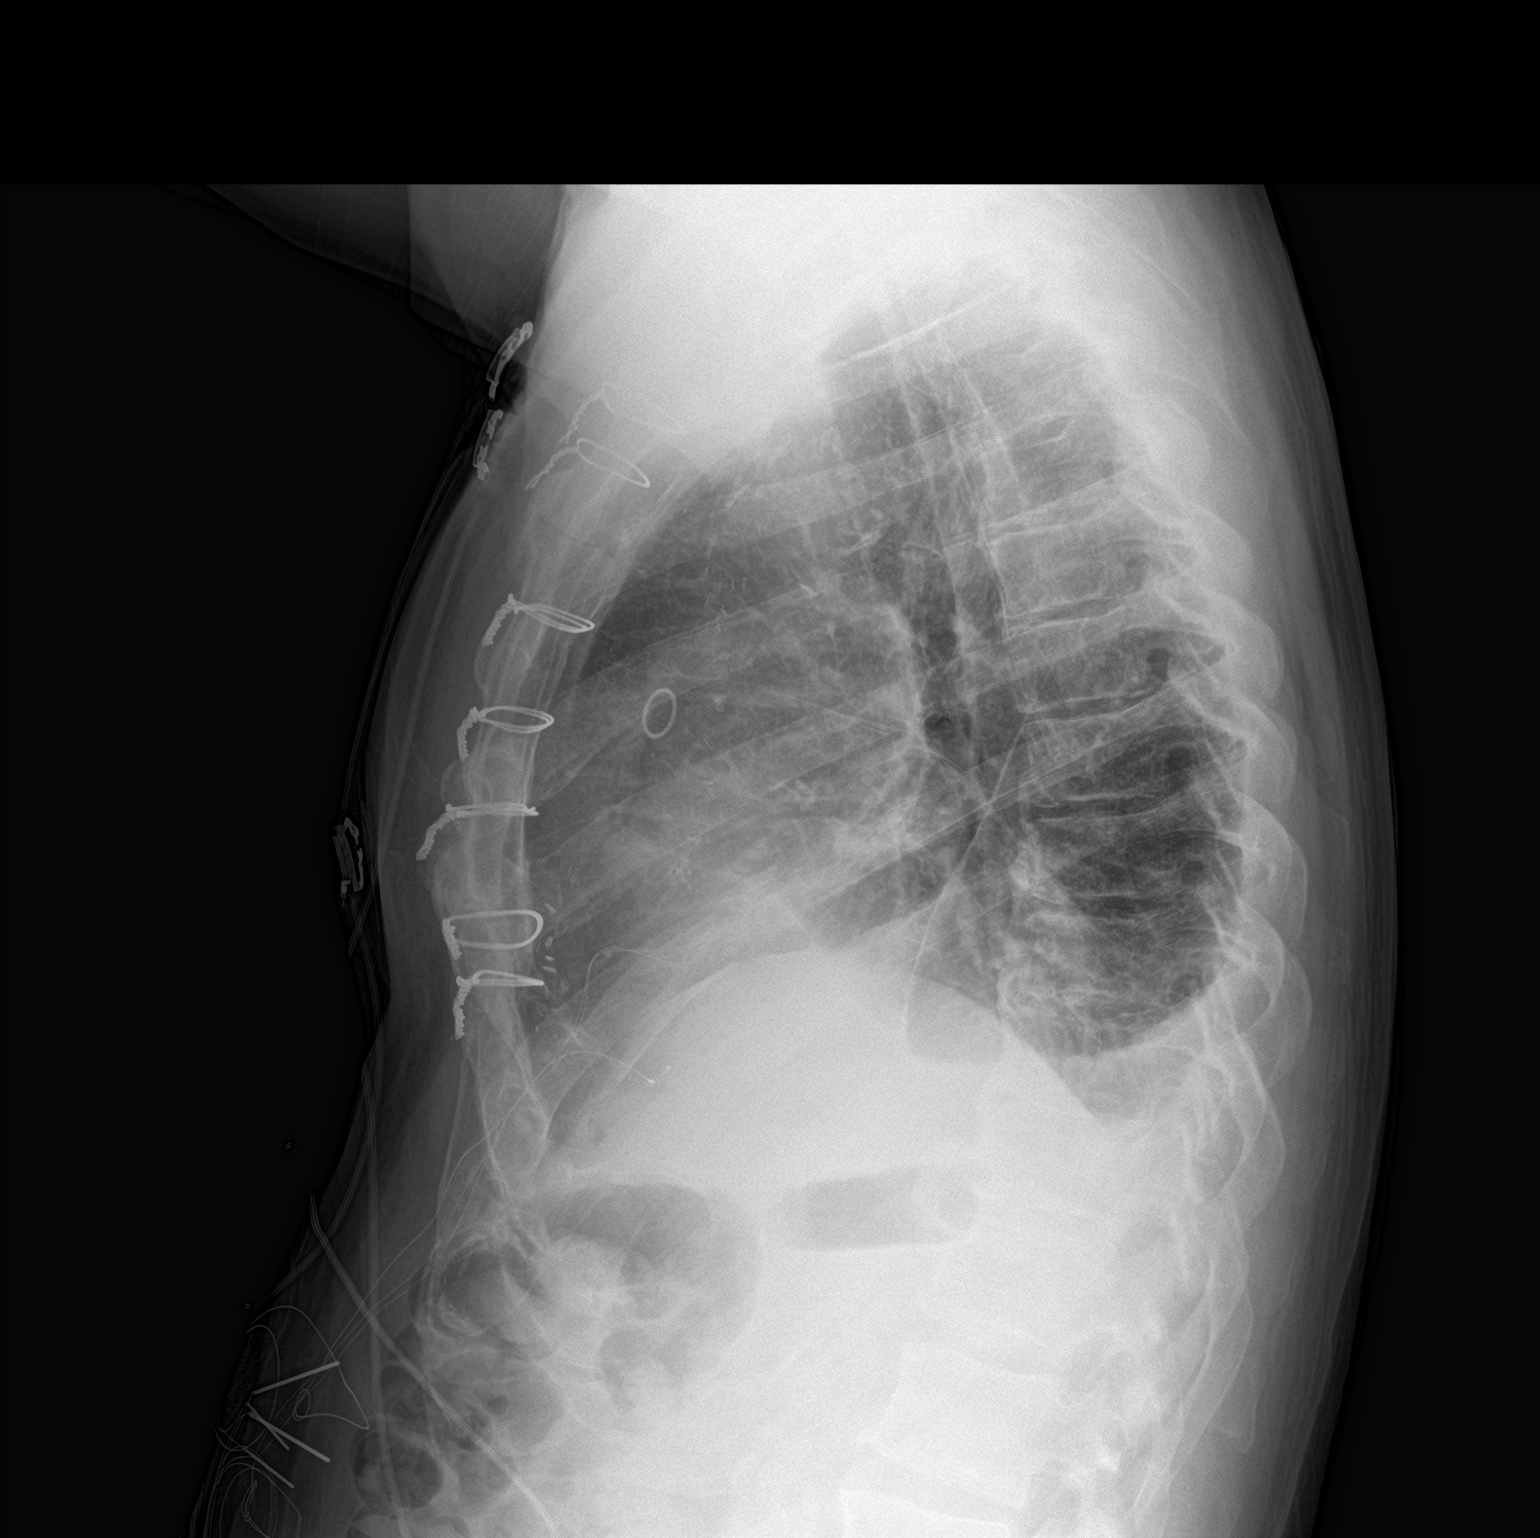

[2 of 2 positions shown; findings below may reference images not displayed]

FINDINGS: Stable cardiac and mediastinal contours status post median
sternotomy and CABG procedure. Monitoring leads overlie the patient.
Interval removal left chest tube. Minimal heterogeneous opacities
left lung base. Small left pleural effusion. Cannot exclude tiny
amount of pleural gas within the left hemithorax inferiorly.
Thoracic spine degenerative changes.
IMPRESSION: 1. Interval removal left chest tube. Cannot exclude tiny amount of
pleural gas within the left hemithorax inferiorly.
2. Small left pleural effusion and underlying opacities favored to
represent atelectasis.

## 2020-05-06 MED ORDER — DILTIAZEM HCL ER COATED BEADS 180 MG PO CP24
180.0000 mg | ORAL_CAPSULE | Freq: Every day | ORAL | Status: DC
  Administered 2020-05-07: 180 mg via ORAL
  Filled 2020-05-06: qty 1

## 2020-05-06 MED ORDER — POTASSIUM CHLORIDE CRYS ER 20 MEQ PO TBCR
40.0000 meq | EXTENDED_RELEASE_TABLET | Freq: Two times a day (BID) | ORAL | Status: AC
  Administered 2020-05-06 (×2): 40 meq via ORAL
  Filled 2020-05-06 (×2): qty 2

## 2020-05-06 MED ORDER — METOLAZONE 5 MG PO TABS: 5.0000 mg | ORAL_TABLET | Freq: Every day | ORAL | Status: DC

## 2020-05-06 NOTE — Progress Notes (Signed)
Was able to wean pt off from 02. Pt's 02 sat >90% at [place], RN

## 2020-05-06 NOTE — Progress Notes (Addendum)
[address]       [place] [address]             [phone]      5 Days Post-Op Procedure(s) (LRB): CORONARY ARTERY BYPASS GRAFTING (CABG) X 2 ON CARDIOPULMONARY BYPASS. LIMA TO LAD, SVG TO DIAG (N/A) TRANSESOPHAGEAL ECHOCARDIOGRAM (TEE) (N/A) ENDOVEIN HARVEST OF GREATER SAPHENOUS VEIN (Right) Subjective: Feels ok overall  Objective: Vital signs in last 24 hours: Temp:  [97.7 F (36.5 C)-98.6 F (37 C)] 97.9 F (36.6 C) (12/19 0809) Pulse Rate:  [79-114] 79 (12/19 0809) Cardiac Rhythm: Sinus tachycardia (12/19 0701) Resp:  [18-20] 20 (12/19 0809) BP: (106-119)/(66-78) 117/78 (12/19 0809) SpO2:  [90 %-95 %] 95 % (12/19 0809) Weight:  [69 kg] 69 kg (12/19 0527)  Hemodynamic parameters for last 24 hours:    Intake/Output from previous day: 12/18 0701 - 12/19 0700 In: -  Out: 1630 [Urine:1600; Chest Tube:30] Intake/Output this shift: No intake/output data recorded.  General appearance: alert, cooperative and no distress Heart: regular rate and rhythm and tachy Lungs: mildly dim in bases Abdomen: benign Extremities: no edema Wound: incis healing well  Lab Results: Recent Labs    05/05/20 0102 05/06/20 0054  WBC 9.9 9.5  HGB 12.3* 11.9*  HCT 35.0* 34.5*  PLT 222 241   BMET:  Recent Labs    05/05/20 0102 05/06/20 0054  NA 131* 130*  K 4.0 3.1*  CL 90* 87*  CO2 32 30  GLUCOSE 117* 117*  BUN <5* 7  CREATININE 0.61 0.62  CALCIUM 9.3 9.1    PT/INR: No results for input(s): LABPROT, INR in the last 72 hours. ABG    Component Value Date/Time   PHART 7.350 05/01/2020 1931   HCO3 22.4 05/01/2020 1931   TCO2 24 05/01/2020 1931   ACIDBASEDEF 3.0 (H) 05/01/2020 1931   O2SAT 53.3 05/02/2020 0451   CBG (last 3)  Recent Labs    05/05/20 1707 05/05/20 2159 05/06/20 0640  GLUCAP 126* 119* 112*    Meds Scheduled Meds: . aspirin EC  325 mg Oral Daily  . diltiazem  120 mg Oral Q12H  . docusate sodium  200 mg Oral Daily  .  ferrous [id number] C-folic acid  1 capsule Oral BID PC  . guaiFENesin  600 mg Oral BID  . insulin aspart  0-24 Units Subcutaneous TID AC & HS  . mouth rinse  15 mL Mouth Rinse BID  . metolazone  5 mg Oral Daily  . mometasone-formoterol  2 puff Inhalation BID  . pantoprazole  40 mg Oral QAC breakfast   Continuous Infusions: PRN Meds:.ALPRAZolam, bisacodyl **OR** bisacodyl, magnesium hydroxide, ondansetron **OR** ondansetron (ZOFRAN) IV, traMADol  Xrays DG Chest Port 1 View  Result Date: 05/05/2020 CLINICAL DATA:  History of CABG EXAM: PORTABLE CHEST 1 VIEW COMPARISON:  May 04, 2020 FINDINGS: The right-sided chest tube and right IJ sheath have been removed. A left-sided chest tube remains in place. The cardiomediastinal silhouette is stable. No right-sided pneumothorax. There may be a tiny hydropneumothorax seen at the lateral left base. Again, a left chest tube is in place. There is clearly a left-sided pleural effusion with underlying atelectasis. No other interval changes or acute abnormalities. IMPRESSION: 1. There is a probable tiny left hydropneumothorax not seen previously. However, a left chest tube is in place. 2. Right-sided chest tube and right IJ sheath have been removed. No other changes. Electronically Signed   By: Dorise Bullion III M.D  On: 05/05/2020 11:46    Assessment/Plan: S/P Procedure(s) (LRB): CORONARY ARTERY BYPASS GRAFTING (CABG) X 2 ON CARDIOPULMONARY BYPASS. LIMA TO LAD, SVG TO DIAG (N/A) TRANSESOPHAGEAL ECHOCARDIOGRAM (TEE) (N/A) ENDOVEIN HARVEST OF GREATER SAPHENOUS VEIN (Right)  1 afeb. VSS, sinus tachy at times, says he's always a bit tachycardic and has been tried on beta- blockers before and is intolerant, some PAC's - replace K+, increase cardizem a further, was on 240 . D/C epw's 2 sats ok on RA 3 good UOP/ weight below preop, normal renal fxn 4 BS well controlled 5 ABLA pretty stable, minor 6 CXR minor effus/atx 7 doing well , poss home  in am  LOS: 5 days    John Giovanni PA-C Pager [phone] 05/06/2020  kcl low - being replaced  I have seen and examined [name] and agree with the above assessment  and plan.  Grace Isaac MD Beeper [phone] Office [phone] 05/06/2020 12:50 PM

## 2020-05-06 NOTE — Progress Notes (Signed)
Pulled pacing wire per order. Pacing wires ends intact.VSS. pt tolerated well.   Lavenia Atlas, RN

## 2020-05-06 NOTE — Progress Notes (Signed)
Mobility Specialist - Progress Note   05/06/20 1219  Mobility  Activity Ambulated in hall  Level of Assistance Modified independent, requires aide device or extra time  Assistive Device Front wheel walker  Distance Ambulated (ft) 370 ft  Mobility Response Tolerated well  Mobility performed by Mobility specialist  $Mobility charge 1 Mobility    Pre-mobility: 106 HR During mobility: 116 HR Post-mobility: 106 HR  Pt received sitting on toilet, unsuccessful in having a BM. Asx throughout. Pt in bed after walk to get wires removed.   Pricilla Handler Mobility Specialist Mobility Specialist Phone: [phone]

## 2020-05-06 NOTE — Progress Notes (Signed)
Pt ambulated 300 ft in the hall way. Tolerated well.   Lavenia Atlas, RN

## 2020-05-06 NOTE — Plan of Care (Signed)

## 2020-05-07 LAB — GLUCOSE, CAPILLARY: Glucose-Capillary: 119 mg/dL — ABNORMAL HIGH (ref 70–99)

## 2020-05-07 LAB — BASIC METABOLIC PANEL
Anion gap: 11 (ref 5–15)
BUN: 12 mg/dL (ref 6–20)
CO2: 28 mmol/L (ref 22–32)
Calcium: 8.8 mg/dL — ABNORMAL LOW (ref 8.9–10.3)
Chloride: 87 mmol/L — ABNORMAL LOW (ref 98–111)
Creatinine, Ser: 0.67 mg/dL (ref 0.61–1.24)
GFR, Estimated: >60 mL/min (ref >60)
Glucose, Bld: 104 mg/dL — ABNORMAL HIGH (ref 70–99)
Potassium: 3.8 mmol/L (ref 3.5–5.1)
Sodium: 126 mmol/L — ABNORMAL LOW (ref 135–145)

## 2020-05-07 MED ORDER — FE FUMARATE-B12-VIT C-FA-IFC PO CAPS: 1.0000 | ORAL_CAPSULE | Freq: Two times a day (BID) | ORAL | 1 refills | Status: DC

## 2020-05-07 MED ORDER — TRAMADOL HCL 50 MG PO TABS: 50.0000 mg | ORAL_TABLET | Freq: Four times a day (QID) | ORAL | 0 refills | Status: AC | PRN

## 2020-05-07 MED ORDER — ASPIRIN 325 MG PO TBEC: 325.0000 mg | DELAYED_RELEASE_TABLET | Freq: Every day | ORAL | Status: DC

## 2020-05-07 MED ORDER — PHENYLEPHRINE HCL-NACL 10-0.9 MG/250ML-% IV SOLN
INTRAVENOUS | Status: AC
  Filled 2020-05-07: qty 250

## 2020-05-07 NOTE — Progress Notes (Addendum)
      [address]       [place] [address]             [phone]      6 Days Post-Op Procedure(s) (LRB): CORONARY ARTERY BYPASS GRAFTING (CABG) X 2 ON CARDIOPULMONARY BYPASS. LIMA TO LAD, SVG TO DIAG (N/A) TRANSESOPHAGEAL ECHOCARDIOGRAM (TEE) (N/A) ENDOVEIN HARVEST OF GREATER SAPHENOUS VEIN (Right)   Subjective:  No new complaints.  Ready to go home.  + ambulation w/o difficulty  Objective: Vital signs in last 24 hours: Temp:  [97.8 F (36.6 C)-98.7 F (37.1 C)] 98 F (36.7 C) (12/20 0432) Pulse Rate:  [79-109] 83 (12/20 0432) Cardiac Rhythm: Normal sinus rhythm (12/19 1952) Resp:  [16-20] 20 (12/20 0432) BP: (97-117)/(61-85) 114/73 (12/20 0432) SpO2:  [92 %-95 %] 93 % (12/20 0432) Weight:  [68.4 kg] 68.4 kg (12/20 0432)  Intake/Output from previous day: 12/19 0701 - 12/20 0700 In: 600 [P.O.:600] Out: -   Gen: no apparent distress Heart: RRR, tachy Lungs: CTA bilaterally Abd: soft non-tender, non-distended Ext: no edema present Incisions: healing w/o evidence of infection, + ecchymosis present  Lab Results: Recent Labs    05/05/20 0102 05/06/20 0054  WBC 9.9 9.5  HGB 12.3* 11.9*  HCT 35.0* 34.5*  PLT 222 241   BMET:  Recent Labs    05/06/20 0054 05/07/20 0115  NA 130* 126*  K 3.1* 3.8  CL 87* 87*  CO2 30 28  GLUCOSE 117* 104*  BUN 7 12  CREATININE 0.62 0.67  CALCIUM 9.1 8.8*    PT/INR: No results for input(s): LABPROT, INR in the last 72 hours. ABG    Component Value Date/Time   PHART 7.350 05/01/2020 1931   HCO3 22.4 05/01/2020 1931   TCO2 24 05/01/2020 1931   ACIDBASEDEF 3.0 (H) 05/01/2020 1931   O2SAT 53.3 05/02/2020 0451   CBG (last 3)  Recent Labs    05/06/20 1642 05/06/20 2151 05/07/20 0605  GLUCAP 118* 110* 119*    Assessment/Plan: S/P Procedure(s) (LRB): CORONARY ARTERY BYPASS GRAFTING (CABG) X 2 ON CARDIOPULMONARY BYPASS. LIMA TO LAD, SVG TO DIAG (N/A) TRANSESOPHAGEAL ECHOCARDIOGRAM (TEE)  (N/A) ENDOVEIN HARVEST OF GREATER SAPHENOUS VEIN (Right)  1. CV- sinus tachycardia- on Cardizem 2. Pulm-no acute issues, patient likes flutter valve for pulm toilet, which has been misplaced, will order replacement 3. Renal- no edema on exam, K improved to 3.8 4. Dispo- patient stable, d/c home today   LOS: 6 days    Ellwood Handler, PA-C 05/07/2020   Dc instructions reviewed with patient   patient examined and medical record reviewed,agree with above note. Tharon Aquas Trigt III 05/07/2020

## 2020-05-07 NOTE — Progress Notes (Signed)
CARDIAC REHAB PHASE I   PRE:  Rate/Rhythm: 117-126 ST  BP:  Supine:   Sitting: 106/69  Standing:    SaO2: 92%RA  MODE:  Ambulation: 470 ft   POST:  Rate/Rhythm: 148 ST some PACs   126 in room  BP:  Supine:   Sitting: 106/69  Standing:    SaO2: 91%RA 0809-0850 Pt walked 470 ft on RA with rolling walker with steady gait. Stopped a couple of times to catch his breath. Denied palpitations with elevated HR. Stated he lives with high HR and that he felt fine. Pt's RN and charge RN aware of HR. Pt stated he has walker at home to use if needed. Education completed re walking for exercise(listening to body), sternal precautions and staying in the tube, IS, and gave heart healthy diet. Discussed CRP 2 and referred to [place]. Pt stated he will consider. Pt voiced understanding of ed. Set up materials for pt to brush his teeth. Pt stated he feels well. Surgeon has ordered flutter valve for pt for home.   Graylon Good, RN BSN  05/07/2020 8:45 AM

## 2020-05-07 NOTE — Progress Notes (Signed)
D/C instructions given to patient. Wound care and medications reviewed. All questions answered. CT sutures removed per order. IV removed clean and intact. Wife to escort pt home.  Clyde Canterbury, RN

## 2020-05-09 ENCOUNTER — Telehealth (HOSPITAL_COMMUNITY): Payer: Self-pay

## 2020-05-09 NOTE — Telephone Encounter (Signed)
Attempted to call patient in regards to Cardiac Rehab - LM on VM 

## 2020-05-10 ENCOUNTER — Other Ambulatory Visit: Payer: Self-pay

## 2020-05-10 MED ORDER — DOXYCYCLINE HYCLATE 100 MG PO TABS: 100.0000 mg | ORAL_TABLET | Freq: Two times a day (BID) | ORAL | 0 refills | Status: DC

## 2020-05-10 NOTE — Progress Notes (Signed)
Sent in Doxycycline 100 mg BID x7 days for patient as advised by Ellwood Handler, PA for patient's drainage from sternal incision.  Started yesterday.  Patient is s/p CABG x2 with Dr. Prescott Gum 05/01/20.  Patient called and stated that it was an "orangish color" no increase pain, swelling.  Slight redness and afebrile.  Patient aware of medication and will have follow-up appointment with PA on Monday/ Tuesday to reassess.  Patient acknowledged receipt.

## 2020-05-15 ENCOUNTER — Ambulatory Visit (INDEPENDENT_AMBULATORY_CARE_PROVIDER_SITE_OTHER): Payer: Self-pay | Admitting: Physician Assistant

## 2020-05-15 ENCOUNTER — Other Ambulatory Visit: Payer: Self-pay

## 2020-05-15 VITALS — BP 135/85 | HR 120 | Temp 98.9°F | Resp 20 | Ht 68.0 in

## 2020-05-15 DIAGNOSIS — Z5189 Encounter for other specified aftercare: Secondary | ICD-10-CM

## 2020-05-15 MED ORDER — CEPHALEXIN 500 MG PO CAPS: 500.0000 mg | ORAL_CAPSULE | Freq: Four times a day (QID) | ORAL | 0 refills | Status: DC

## 2020-05-15 MED ORDER — OXYCODONE HCL 5 MG PO TABS: 5.0000 mg | ORAL_TABLET | ORAL | 0 refills | Status: DC | PRN

## 2020-05-15 NOTE — Progress Notes (Signed)
HPI:  Patient returns for wound check.  He sent a picture to the office last week with some mild sternal drainage.  He states the wound hasn't healed completely.  He has been wiping the wound 2x per day with baby wipes in addition to washing daily with soap and water.  He denies fevers, chills.  He also mentions that he is having a little increased chest discomfort today as he flipped over in his sleep and woke up laying face down.  He is accompanied by his wife, who states he is having a hard time taking it easy and she thinks he is probably doing too much.  The patient was instructed on the importance of walking 3 x per day.  However, he should not be attempting to do anything strenuous or work with his arms which can exacerbate his discomfort and actually hinder his healing.  Current Outpatient Medications  Medication Sig Dispense Refill  . aspirin EC 325 MG EC tablet Take 1 tablet (325 mg total) by mouth daily.    . cephALEXin (KEFLEX) 500 MG capsule Take 1 capsule (500 mg total) by mouth 4 (four) times daily. 28 capsule 0  . diclofenac Sodium (VOLTAREN) 1 % GEL Apply 2 g topically daily as needed (Knee and back pain).    Marland Kitchen diltiazem (CARDIZEM CD) 240 MG 24 hr capsule Take 1 capsule (240 mg total) by mouth daily. 90 capsule 3  . diphenhydrAMINE (BENADRYL) 25 MG tablet Take 12.5 mg by mouth at bedtime.    . ferrous fumarate-b12-vitamic C-folic acid (TRINSICON / FOLTRIN) capsule Take 1 capsule by mouth 2 (two) times daily after a meal. 60 capsule 1  . fluticasone (FLONASE) 50 MCG/ACT nasal spray Place 2 sprays into both nostrils daily as needed for allergies.     Marland Kitchen LORazepam (ATIVAN) 0.5 MG tablet Take 0.5 mg by mouth daily as needed for anxiety.    . Multiple Vitamin (MULTIVITAMIN WITH MINERALS) TABS tablet Take 1 tablet by mouth daily.    Marland Kitchen oxyCODONE (OXY IR/ROXICODONE) 5 MG immediate release tablet Take 1 tablet (5 mg total) by mouth every 4 (four) hours as needed for severe pain. 20 tablet 0  .  tetrahydrozoline 0.05 % ophthalmic solution Place 1 drop into both eyes 3 (three) times daily as needed (for dry/irritated eyes.).     No current facility-administered medications for this visit.    Physical Exam:  BP 135/85 (BP Location: Left Arm, Patient Position: Sitting)   Pulse (!) 120   Temp 98.9 F (37.2 C)   Resp 20   Ht 5\' 8"  (1.727 m)   SpO2 92% Comment: RA with mask on  BMI 22.93 kg/m   Gen: no apparent distress Sternum- stable, intact, no evidence of wire dehiscence Incision: pin hole size opening with minimal serous drainage present, there is mild erythema present, no foul odor noted.  A/P:  1. Superficial sternal cellulitis- likely due to suture abscess.  He was originally given a prescription for Doxycycline.. We will switch to Keflex 500 mg QID x 7 days.... He was also instructed to not wipe his incision 2x per day with baby wipes and to only use soap and water daily 2. RTC as scheduled with Dr. ... if his wound persists he will call Donata Clay for an appointment on Monday or Tuesday next week   Monday, PA-C [facility] [phone]

## 2020-05-21 ENCOUNTER — Telehealth: Payer: Self-pay | Admitting: *Deleted

## 2020-05-21 ENCOUNTER — Encounter: Payer: Self-pay | Admitting: Cardiology

## 2020-05-21 ENCOUNTER — Ambulatory Visit: Payer: BC Managed Care – PPO | Admitting: Cardiology

## 2020-05-21 ENCOUNTER — Other Ambulatory Visit: Payer: Self-pay

## 2020-05-21 VITALS — BP 110/70 | HR 111 | Ht 68.0 in | Wt 148.0 lb

## 2020-05-21 DIAGNOSIS — Z789 Other specified health status: Secondary | ICD-10-CM

## 2020-05-21 DIAGNOSIS — I208 Other forms of angina pectoris: Secondary | ICD-10-CM

## 2020-05-21 DIAGNOSIS — I2510 Atherosclerotic heart disease of native coronary artery without angina pectoris: Secondary | ICD-10-CM

## 2020-05-21 DIAGNOSIS — E782 Mixed hyperlipidemia: Secondary | ICD-10-CM

## 2020-05-21 DIAGNOSIS — Q231 Congenital insufficiency of aortic valve: Secondary | ICD-10-CM

## 2020-05-21 DIAGNOSIS — I700 Atherosclerosis of aorta: Secondary | ICD-10-CM

## 2020-05-21 DIAGNOSIS — I77810 Thoracic aortic ectasia: Secondary | ICD-10-CM

## 2020-05-21 NOTE — Telephone Encounter (Signed)
Pt's wife, Judeth Cornfield, contacted the office stating pt was seen in the office today by Dr. Anne Fu who advised wife to contact Dr. Donata Clay r/t oozing sternal wound incision. Pt was seen in our office 12/28 for a wound check. Pt denies warmth of wound or fever. Wife states wound is slightly red with drainage still coming from incision. Wife states not much as changed with the wound since pt was seen in office on 12/28. Pt currently taking Keflex. Pt's f/u appt with Dr. Donata Clay changed from 1/12 to 1/5. No further questions at this time.

## 2020-05-21 NOTE — Patient Instructions (Signed)
Medication Instructions:  The current medical regimen is effective;  continue present plan and medications.  *If you need a refill on your cardiac medications before your next appointment, please call your pharmacy*  Follow-Up: At [facility], you and your health needs are our priority.  As part of our continuing mission to provide you with exceptional heart care, we have created designated Provider Care Teams.  These Care Teams include your primary Cardiologist (physician) and Advanced Practice Providers (APPs -  Physician Assistants and Nurse Practitioners) who all work together to provide you with the care you need, when you need it.  We recommend signing up for the patient portal called "MyChart".  Sign up information is provided on this After Visit Summary.  MyChart is used to connect with patients for Virtual Visits (Telemedicine).  Patients are able to view lab/test results, encounter notes, upcoming appointments, etc.  Non-urgent messages can be sent to your provider as well.   To learn more about what you can do with MyChart, go to [email or web address].    Your next appointment:   2 month(s)  The format for your next appointment:   In Person  Provider:   Mark Skains, MD   Thank you for choosing [facility]!!     

## 2020-05-21 NOTE — Progress Notes (Signed)
Cardiology Office Note:    Date:  05/21/2020   ID:  Philip Winters, DOB 07-01-1960, MRN [id number]  PCP:  Antony Contras, MD  [facility] Cardiologist:  Candee Furbish, MD  [facility] Electrophysiologist:  None   Referring MD: Antony Contras, MD     History of Present Illness:    Philip Winters is a 60 y.o. male here for the follow-up of coronary artery disease status post CABG x2 LIMA to LAD (90% proximal LAD) and SVG to diagonal on 05/01/2021 by Dr. Darcey Nora.  High risk nuclear stress test.  Had extensive anginal symptoms prior.  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  Has bicuspid aortic valve, 4.1 cm aortic root.  1.5 cm infiltrative density in the superior segment of the left upper lobe which will need to be followed.  Had sternal cellulitis.  Given doxycycline.  Last seen on 05/15/2020 in surgery clinic.  Using baby wipes likely to frequently to wipe wound.  Still having inferior sternal wound leakage.  No odor.  Overall progressing well.  Walking 20 minutes at a time.  Still feels some shortness of breath with activity.    Past Medical History:  Diagnosis Date  . Allergy   . Arthritis   . GERD (gastroesophageal reflux disease)    occ  . Heart murmur    baby  . High cholesterol   . Hypertension   . Pneumonia   . Right carotid bruit 06/10/2017  . Sciatica   . Seasonal allergies   . Sinus tachycardia   . Spinal stenosis     Past Surgical History:  Procedure Laterality Date  . CARPAL TUNNEL WITH CUBITAL TUNNEL Left 11/10/2013   Procedure: LEFT CARPAL TUNNEL RELEASE;  Surgeon: Cammie Sickle, MD;  Location: [place];  Service: Orthopedics;  Laterality: Left;  . COLONOSCOPY    . CORONARY ARTERY BYPASS GRAFT N/A 05/01/2020   Procedure: CORONARY ARTERY BYPASS GRAFTING (CABG) X 2 ON CARDIOPULMONARY BYPASS. LIMA TO LAD, SVG TO DIAG;  Surgeon: Ivin Poot, MD;  Location: [place];  Service: Open Heart Surgery;   Laterality: N/A;  . ENDOVEIN HARVEST OF GREATER SAPHENOUS VEIN Right 05/01/2020   Procedure: ENDOVEIN HARVEST OF GREATER SAPHENOUS VEIN;  Surgeon: Ivin Poot, MD;  Location: [place];  Service: Open Heart Surgery;  Laterality: Right;  . KNEE ARTHROSCOPY  [id number]   left and right  . LUMBAR LAMINECTOMY/DECOMPRESSION MICRODISCECTOMY Left 09/29/2017   Procedure: LEFT LUMBAR TWO - LUMBAR THREELAMINOTOMY/MICRODISCECTOMY;  Surgeon: Jovita Gamma, MD;  Location: [place];  Service: Neurosurgery;  Laterality: Left;  LEFT LUMBAR 2- LUMBAR 3 LAMINOTOMY/MICRODISCECTOMY  . NASAL SINUS SURGERY  05/2015  . RIGHT/LEFT HEART CATH AND CORONARY ANGIOGRAPHY N/A 04/10/2020   Procedure: RIGHT/LEFT HEART CATH AND CORONARY ANGIOGRAPHY;  Surgeon: Troy Sine, MD;  Location: [place] [facility];  Service: Cardiovascular;  Laterality: N/A;  . TEE WITHOUT CARDIOVERSION N/A 05/01/2020   Procedure: TRANSESOPHAGEAL ECHOCARDIOGRAM (TEE);  Surgeon: Prescott Gum, Collier Salina, MD;  Location: [place];  Service: Open Heart Surgery;  Laterality: N/A;  . TOTAL KNEE ARTHROPLASTY Right 06/24/2016   Procedure: TOTAL KNEE ARTHROPLASTY;  Surgeon: Garald Balding, MD;  Location: [place];  Service: Orthopedics;  Laterality: Right;  . TRIGGER FINGER RELEASE Left 11/10/2013   Procedure: LEFT INDEX A-1 PULLEY RELEASE;  Surgeon: Cammie Sickle, MD;  Location: [place];  Service: Orthopedics;  Laterality: Left;  . ULNAR NERVE TRANSPOSITION Left 11/10/2013  Procedure: ULNAR NERVE TRANSPOSITION;  Surgeon: Cammie Sickle, MD;  Location: [place];  Service: Orthopedics;  Laterality: Left;    Current Medications: Current Meds  Medication Sig  . aspirin EC 325 MG EC tablet Take 1 tablet (325 mg total) by mouth daily.  . cephALEXin (KEFLEX) 500 MG capsule Take 1 capsule (500 mg total) by mouth 4 (four) times daily.  . diclofenac Sodium (VOLTAREN) 1 % GEL Apply 2 g topically daily as needed (Knee and back pain).   Marland Kitchen diltiazem (CARDIZEM CD) 240 MG 24 hr capsule Take 1 capsule (240 mg total) by mouth daily.  . diphenhydrAMINE (BENADRYL) 25 MG tablet Take 12.5 mg by mouth at bedtime.  . ferrous Q000111Q C-folic acid (TRINSICON / FOLTRIN) capsule Take 1 capsule by mouth 2 (two) times daily after a meal.  . fluticasone (FLONASE) 50 MCG/ACT nasal spray Place 2 sprays into both nostrils daily as needed for allergies.   Marland Kitchen LORazepam (ATIVAN) 0.5 MG tablet Take 0.5 mg by mouth daily as needed for anxiety.  . Multiple Vitamin (MULTIVITAMIN WITH MINERALS) TABS tablet Take 1 tablet by mouth daily.  Marland Kitchen oxyCODONE (OXY IR/ROXICODONE) 5 MG immediate release tablet Take 1 tablet (5 mg total) by mouth every 4 (four) hours as needed for severe pain.  Marland Kitchen tetrahydrozoline 0.05 % ophthalmic solution Place 1 drop into both eyes 3 (three) times daily as needed (for dry/irritated eyes.).     Allergies:   Mushroom extract complex, Penicillins, Atorvastatin, Chantix [varenicline], Dilaudid [hydromorphone], Pravastatin, Rosuvastatin, Bystolic [nebivolol hcl], Codeine, Iodine, and Metoprolol   Social History   Socioeconomic History  . Marital status: Married    Spouse name: [name]  . Number of children: 0  . Years of education: 42  . Highest education level: Not on file  Occupational History  . Occupation: Self employed  Tobacco Use  . Smoking status: Former Smoker    Packs/day: 1.00    Years: 37.00    Pack years: 37.00    Types: Cigarettes    Quit date: 05/04/2016    Years since quitting: 4.0  . Smokeless tobacco: Never Used  Vaping Use  . Vaping Use: Former  Substance and Sexual Activity  . Alcohol use: Yes    Alcohol/week: 3.0 standard drinks    Types: 3 Glasses of wine per week    Comment: weekends only with dinner  . Drug use: No  . Sexual activity: Not on file  Other Topics Concern  . Not on file  Social History Narrative   Lives with wife, [name]   Caffeine use: Coffee daily   Right  handed    Social Determinants of Health   Financial Resource Strain: Not on file  Food Insecurity: Not on file  Transportation Needs: Not on file  Physical Activity: Not on file  Stress: Not on file  Social Connections: Not on file     ROS:   Please see the history of present illness.    Denies any fevers chills nausea vomiting syncope bleeding.  Appetite is still somewhat poor.  All other systems reviewed and are negative.  EKGs/Labs/Other Studies Reviewed:    The following studies were reviewed today:  Sinus tachycardia 111 with no other abnormalities EKG:  EKG is  ordered today.  The ekg ordered today demonstrates   Recent Labs: 04/30/2020: ALT 23 05/02/2020: Magnesium 2.2 05/06/2020: Hemoglobin 11.9; Platelets 241 05/07/2020: BUN 12; Creatinine, Ser 0.67; Potassium 3.8; Sodium 126  Recent Lipid Panel No results found  for: CHOL, TRIG, HDL, CHOLHDL, VLDL, LDLCALC, LDLDIRECT   Risk Assessment/Calculations:       Physical Exam:    VS:  BP 110/70   Pulse (!) 111   Ht 5\' 8"  (1.727 m)   Wt 148 lb (67.1 kg)   SpO2 99%   BMI 22.50 kg/m     Wt Readings from Last 3 Encounters:  05/21/20 148 lb (67.1 kg)  05/07/20 150 lb 12.7 oz (68.4 kg)  04/30/20 155 lb 8 oz (70.5 kg)     GEN:  Well nourished, well developed in no acute distress HEENT: Normal NECK: No JVD; No carotid bruits, normal LYMPHATICS: No lymphadenopathy CARDIAC: RRR, 2/6 SM, rubs, gallops, inferior sternal wound with 4 x 4 dressing in place RESPIRATORY:  Clear to auscultation without rales, wheezing or rhonchi  ABDOMEN: Soft, non-tender, non-distended MUSCULOSKELETAL:  No edema; No deformity  SKIN: Warm and dry NEUROLOGIC:  Alert and oriented x 3 PSYCHIATRIC:  Normal affect   ASSESSMENT:    1. Atherosclerosis of native coronary artery of native heart without angina pectoris   2. Bicuspid aortic valve   3. Stable angina pectoris (HCC)   4. Mixed hyperlipidemia   5. Statin intolerance   6.  Aortic atherosclerosis (HCC)   7. Dilated aortic root (HCC)    PLAN:    In order of problems listed above:  Coronary artery disease status post CABG 05/01/2020 -Continue with aggressive secondary risk factor prevention.  Appreciate lipid clinic assistance.  LDL was 135 not on any lipid-lowering therapy.  Discharge summary, hospitalization, echocardiogram, cardiac catheterization, stress test, CT scan reviewed  Bicuspid aortic valve -Mean gradient on cardiac catheterization was 10 mmHg.  14 mmHg on echocardiogram.  No valve replacement was performed.  Continue monitoring with echocardiogram.  Dilated aortic root/aortic atherosclerosis -4.1 cm.  Continue to monitor with echocardiogram/CT  Statin intolerance -PCSK9 inhibitor Repatha.  We will go ahead and initiate that soon with the lipid clinic.  He went to get his surgery over with.  Superior segment of left lobe lower lobe 15 x 11 mm solid nodule -Continue to monitor with CT scan.  Sternal wound cellulitis -Currently on doxycycline/now Keflex.  Instructed to see Dr. 05/03/2020 team if no improvement.  We will go ahead and have him see surgery team again.        Medication Adjustments/Labs and Tests Ordered: Current medicines are reviewed at length with the patient today.  Concerns regarding medicines are outlined above.  Orders Placed This Encounter  Procedures  . EKG 12-Lead   No orders of the defined types were placed in this encounter.   Patient Instructions  Medication Instructions:  The current medical regimen is effective;  continue present plan and medications.  *If you need a refill on your cardiac medications before your next appointment, please call your pharmacy*  Follow-Up: At [facility], you and your health needs are our priority.  As part of our continuing mission to provide you with exceptional heart care, we have created designated Provider Care Teams.  These Care Teams include your primary Cardiologist  (physician) and Advanced Practice Providers (APPs -  Physician Assistants and Nurse Practitioners) who all work together to provide you with the care you need, when you need it.  We recommend signing up for the patient portal called "MyChart".  Sign up information is provided on this After Visit Summary.  MyChart is used to connect with patients for Virtual Visits (Telemedicine).  Patients are able to view lab/test results, encounter  notes, upcoming appointments, etc.  Non-urgent messages can be sent to your provider as well.   To learn more about what you can do with MyChart, go to [email or web address].    Your next appointment:   2 month(s)  The format for your next appointment:   In Person  Provider:   Candee Furbish, MD   Thank you for choosing [facility]!!         Signed, Candee Furbish, MD  05/21/2020 9:30 AM    [place]

## 2020-05-22 ENCOUNTER — Other Ambulatory Visit: Payer: Self-pay | Admitting: Cardiothoracic Surgery

## 2020-05-22 DIAGNOSIS — Z951 Presence of aortocoronary bypass graft: Secondary | ICD-10-CM

## 2020-05-23 ENCOUNTER — Other Ambulatory Visit: Payer: Self-pay

## 2020-05-23 ENCOUNTER — Ambulatory Visit (INDEPENDENT_AMBULATORY_CARE_PROVIDER_SITE_OTHER): Payer: Self-pay | Admitting: Cardiothoracic Surgery

## 2020-05-23 ENCOUNTER — Encounter: Payer: Self-pay | Admitting: Cardiothoracic Surgery

## 2020-05-23 ENCOUNTER — Ambulatory Visit
Admission: RE | Admit: 2020-05-23 | Discharge: 2020-05-23 | Disposition: A | Discharge status: Home / Self Care | Payer: BC Managed Care – PPO | Source: Ambulatory Visit | Attending: Cardiothoracic Surgery | Admitting: Cardiothoracic Surgery

## 2020-05-23 VITALS — BP 125/64 | HR 106 | Temp 98.4°F | Resp 20 | Ht 68.0 in | Wt 147.6 lb

## 2020-05-23 DIAGNOSIS — I517 Cardiomegaly: Secondary | ICD-10-CM | POA: Diagnosis not present

## 2020-05-23 DIAGNOSIS — R911 Solitary pulmonary nodule: Secondary | ICD-10-CM | POA: Insufficient documentation

## 2020-05-23 DIAGNOSIS — L0390 Cellulitis, unspecified: Secondary | ICD-10-CM | POA: Insufficient documentation

## 2020-05-23 DIAGNOSIS — J9811 Atelectasis: Secondary | ICD-10-CM | POA: Diagnosis not present

## 2020-05-23 DIAGNOSIS — Z951 Presence of aortocoronary bypass graft: Secondary | ICD-10-CM

## 2020-05-23 DIAGNOSIS — L03313 Cellulitis of chest wall: Secondary | ICD-10-CM

## 2020-05-23 DIAGNOSIS — M47814 Spondylosis without myelopathy or radiculopathy, thoracic region: Secondary | ICD-10-CM | POA: Diagnosis not present

## 2020-05-23 IMAGING — DX DG CHEST 2V
2 series · 2 of 2 positions shown · non-contrast
Comparison: [DATE].

CLINICAL DATA: Status post CABG.

EXAM:
CHEST - 2 VIEW

[dg chest 2 view (1 of 2)]
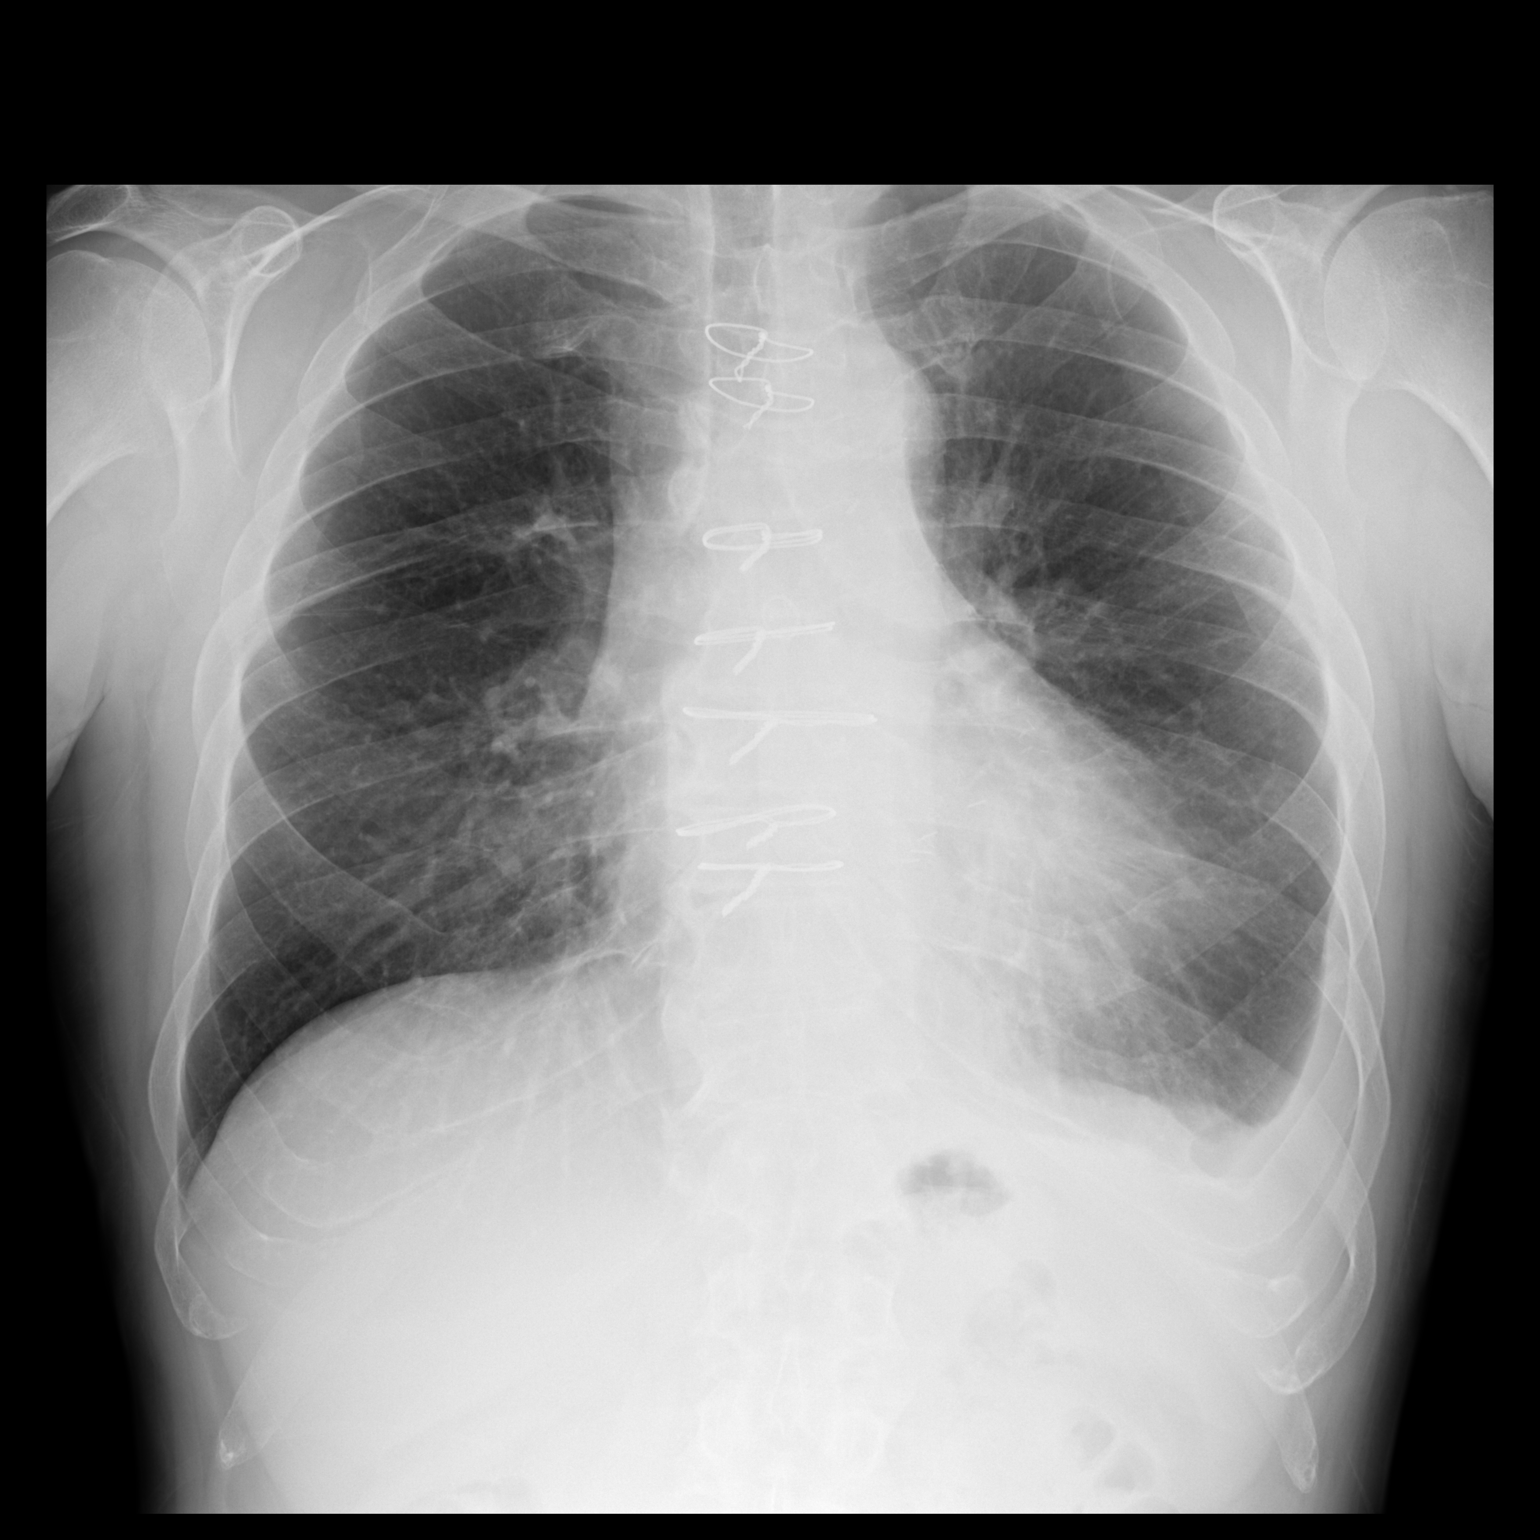

[dg chest 2 view (2 of 2)]
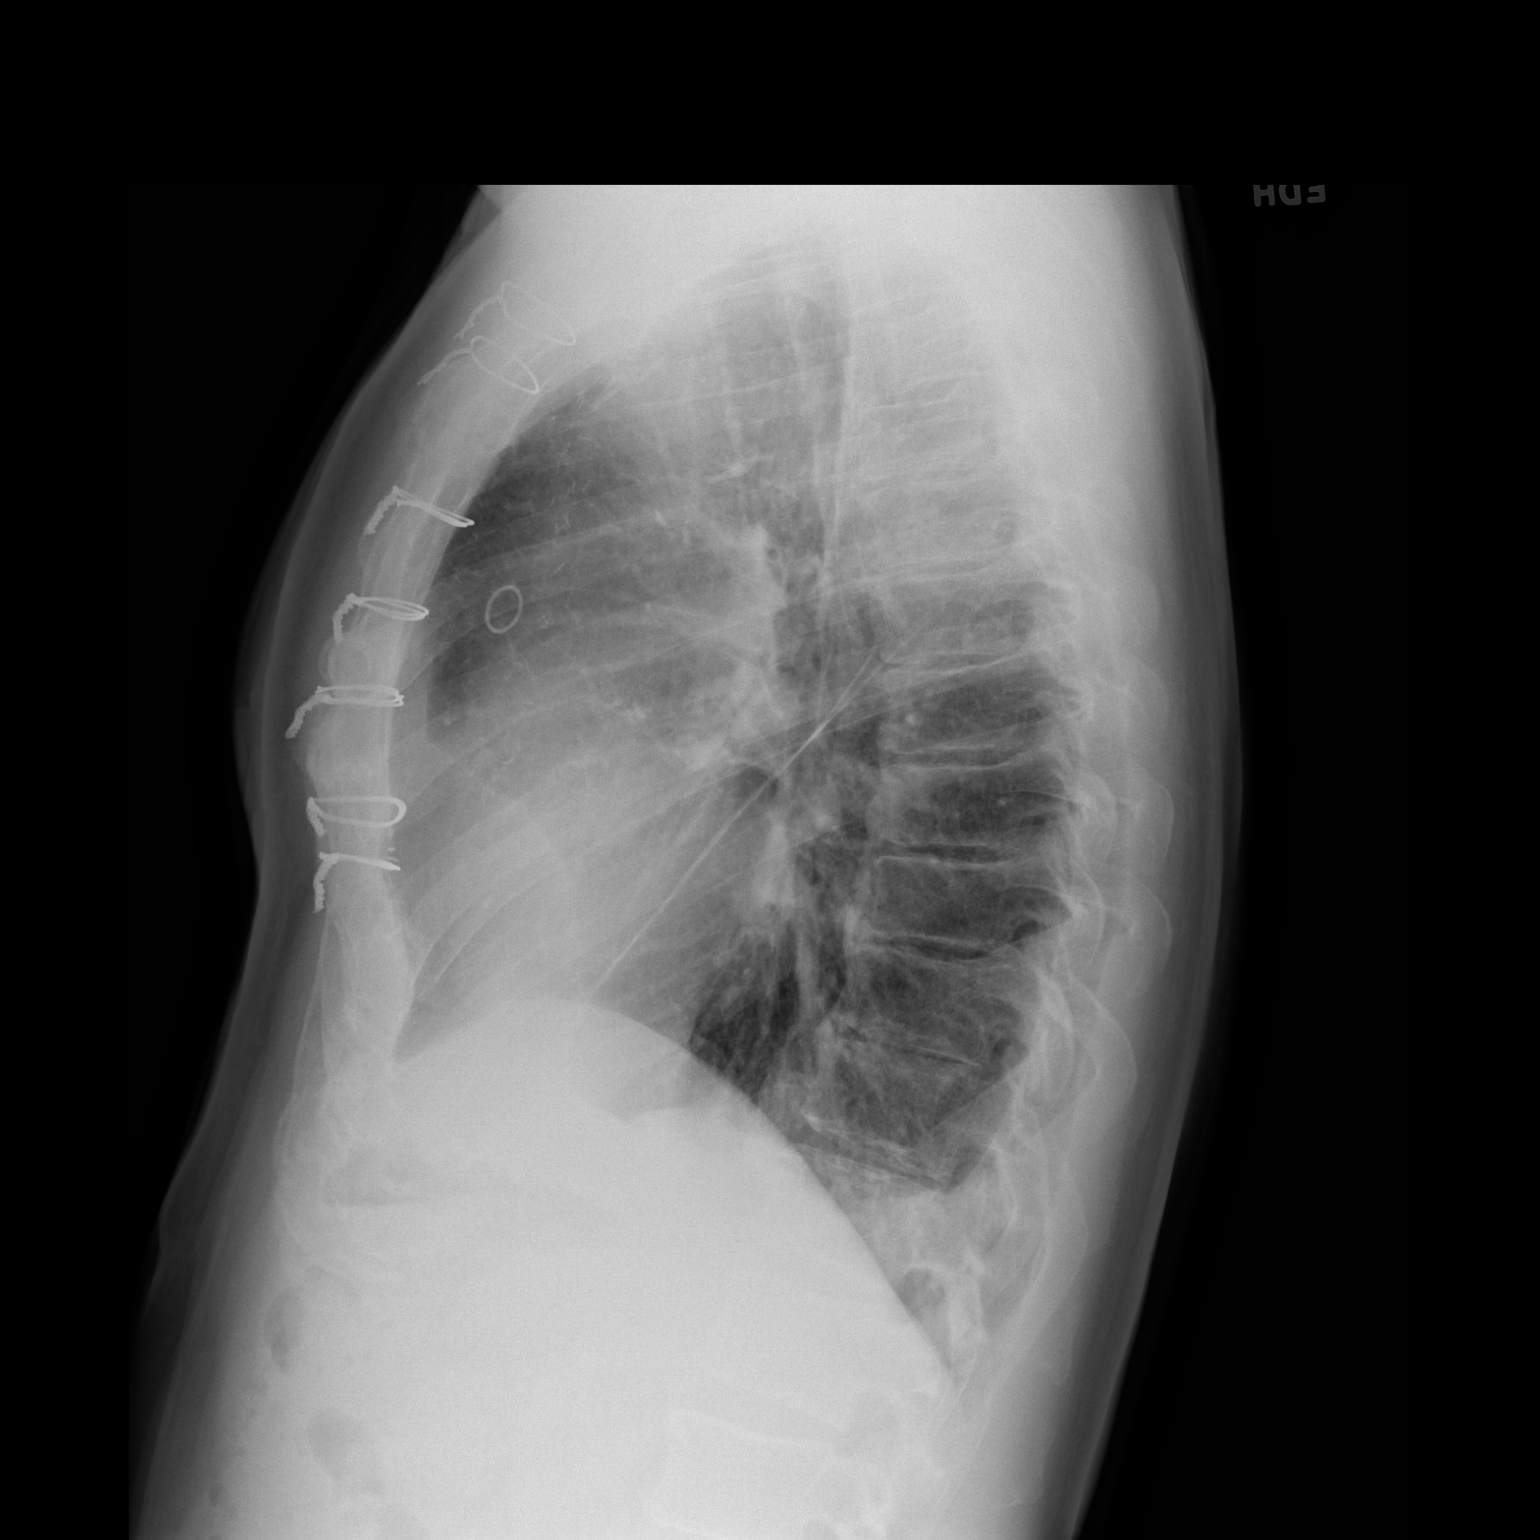

[2 of 2 positions shown; findings below may reference images not displayed]

FINDINGS: Prior CABG. Cardiomegaly. No pulmonary venous congestion. Low lung
volumes with bibasilar atelectasis, improved from prior exam. Small
left pleural effusion again noted without interim change. No
pneumothorax. Degenerative change thoracic spine.
IMPRESSION: 1. Prior CABG. Cardiomegaly. No pulmonary venous congestion.
2. Low lung volumes with bibasilar atelectasis, improved from prior
exam. Small left pleural effusion again noted without interim
change. No pneumothorax.

## 2020-05-23 MED ORDER — CEPHALEXIN 500 MG PO CAPS: 500.0000 mg | ORAL_CAPSULE | Freq: Three times a day (TID) | ORAL | 0 refills | Status: DC

## 2020-05-23 MED ORDER — ALPRAZOLAM 0.5 MG PO TABS: 0.5000 mg | ORAL_TABLET | Freq: Two times a day (BID) | ORAL | 0 refills | Status: AC | PRN

## 2020-05-23 NOTE — Progress Notes (Signed)
PCP is Tally Joe, MD Referring Provider is Tally Joe, MD  Chief Complaint  Patient presents with  . Routine Post Op    S/p CABG 05/01/20, cxr today    HPI: Patient returns for follow-up and a wound check after multivessel CABG 3 weeks ago.  He developed some drainage from the lower third of the sternal incision and is taking a course of oral Keflex.  The drainage is decreasing.  No fever.  No symptoms of angina or CHF.  Patient has some neuropathic pain with numbness in the ulnar aspect of the left hand.  Left hand grip is intact but somewhat decreased.  Chest x-ray taken today shows clear lung fields, sternal wires intact and no pleural effusion.  Preoperative chest CT scan showed a semisolid nodule in the left lower lobe.  The patient has a long history of previous smoking. Past Medical History:  Diagnosis Date  . Allergy   . Arthritis   . GERD (gastroesophageal reflux disease)    occ  . Heart murmur    baby  . High cholesterol   . Hypertension   . Pneumonia   . Right carotid bruit 06/10/2017  . Sciatica   . Seasonal allergies   . Sinus tachycardia   . Spinal stenosis     Past Surgical History:  Procedure Laterality Date  . CARPAL TUNNEL WITH CUBITAL TUNNEL Left 11/10/2013   Procedure: LEFT CARPAL TUNNEL RELEASE;  Surgeon: Wyn Forster, MD;  Location: [facility];  Service: Orthopedics;  Laterality: Left;  . COLONOSCOPY    . CORONARY ARTERY BYPASS GRAFT N/A 05/01/2020   Procedure: CORONARY ARTERY BYPASS GRAFTING (CABG) X 2 ON CARDIOPULMONARY BYPASS. LIMA TO LAD, SVG TO DIAG;  Surgeon: Kerin Perna, MD;  [facility];  Service: Open Heart Surgery;  Laterality: N/A;  . ENDOVEIN HARVEST OF GREATER SAPHENOUS VEIN Right 05/01/2020   Procedure: ENDOVEIN HARVEST OF GREATER SAPHENOUS VEIN;  Surgeon: Kerin Perna, MD;  Location: [facility];  Service: Open Heart Surgery;  Laterality: Right;  . KNEE ARTHROSCOPY  [id number]   left and right  . LUMBAR  LAMINECTOMY/DECOMPRESSION MICRODISCECTOMY Left 09/29/2017   Procedure: LEFT LUMBAR TWO - LUMBAR THREELAMINOTOMY/MICRODISCECTOMY;  Surgeon: Shirlean Kelly, MD;  Location: [facility];  Service: Neurosurgery;  Laterality: Left;  LEFT LUMBAR 2- LUMBAR 3 LAMINOTOMY/MICRODISCECTOMY  . NASAL SINUS SURGERY  05/2015  . RIGHT/LEFT HEART CATH AND CORONARY ANGIOGRAPHY N/A 04/10/2020   Procedure: RIGHT/LEFT HEART CATH AND CORONARY ANGIOGRAPHY;  Surgeon: Lennette Bihari, MD;  Location: [facility];  Service: Cardiovascular;  Laterality: N/A;  . TEE WITHOUT CARDIOVERSION N/A 05/01/2020   Procedure: TRANSESOPHAGEAL ECHOCARDIOGRAM (TEE);  Surgeon: Donata Clay, Theron Arista, MD;  Location: [facility];  Service: Open Heart Surgery;  Laterality: N/A;  . TOTAL KNEE ARTHROPLASTY Right 06/24/2016   Procedure: TOTAL KNEE ARTHROPLASTY;  Surgeon: Valeria Batman, MD;  Location: [facility];  Service: Orthopedics;  Laterality: Right;  . TRIGGER FINGER RELEASE Left 11/10/2013   Procedure: LEFT INDEX A-1 PULLEY RELEASE;  Surgeon: Wyn Forster, MD;  Location: [facility];  Service: Orthopedics;  Laterality: Left;  . ULNAR NERVE TRANSPOSITION Left 11/10/2013   Procedure: ULNAR NERVE TRANSPOSITION;  Surgeon: Wyn Forster, MD;  Location: [facility];  Service: Orthopedics;  Laterality: Left;    History reviewed. No pertinent family history.  Social History Social History   Tobacco Use  . Smoking status: Former Smoker    Packs/day:  1.00    Years: 37.00    Pack years: 37.00    Types: Cigarettes    Quit date: 05/04/2016    Years since quitting: 4.0  . Smokeless tobacco: Never Used  Vaping Use  . Vaping Use: Former  Substance Use Topics  . Alcohol use: Yes    Alcohol/week: 3.0 standard drinks    Types: 3 Glasses of wine per week    Comment: weekends only with dinner  . Drug use: No    Current Outpatient Medications  Medication Sig Dispense Refill  . aspirin EC 325 MG EC tablet Take 1 tablet  (325 mg total) by mouth daily.    . cephALEXin (KEFLEX) 500 MG capsule Take 1 capsule (500 mg total) by mouth 4 (four) times daily. 28 capsule 0  . diclofenac Sodium (VOLTAREN) 1 % GEL Apply 2 g topically daily as needed (Knee and back pain).    Marland Kitchen diltiazem (CARDIZEM CD) 240 MG 24 hr capsule Take 1 capsule (240 mg total) by mouth daily. 90 capsule 3  . diphenhydrAMINE (BENADRYL) 25 MG tablet Take 12.5 mg by mouth at bedtime.    . ferrous Q000111Q C-folic acid (TRINSICON / FOLTRIN) capsule Take 1 capsule by mouth 2 (two) times daily after a meal. 60 capsule 1  . fluticasone (FLONASE) 50 MCG/ACT nasal spray Place 2 sprays into both nostrils daily as needed for allergies.     Marland Kitchen LORazepam (ATIVAN) 0.5 MG tablet Take 0.5 mg by mouth daily as needed for anxiety.    . Multiple Vitamin (MULTIVITAMIN WITH MINERALS) TABS tablet Take 1 tablet by mouth daily.    Marland Kitchen oxyCODONE (OXY IR/ROXICODONE) 5 MG immediate release tablet Take 1 tablet (5 mg total) by mouth every 4 (four) hours as needed for severe pain. 20 tablet 0  . tetrahydrozoline 0.05 % ophthalmic solution Place 1 drop into both eyes 3 (three) times daily as needed (for dry/irritated eyes.).     No current facility-administered medications for this visit.    Allergies  Allergen Reactions  . Mushroom Extract Complex Anaphylaxis  . Penicillins Shortness Of Breath    Childhood allergy.  Has patient had a PCN reaction causing immediate rash, facial/tongue/throat swelling, SOB or lightheadedness with hypotension: No Has patient had a PCN reaction causing severe rash involving mucus membranes or skin necrosis: No Has patient had a PCN reaction that required hospitalization No Has patient had a PCN reaction occurring within the last 10 years: No If all of the above answers are "NO", then may proceed with C  . Atorvastatin     myalgia  . Chantix [Varenicline]     Other reaction(s): headaches  . Dilaudid [Hydromorphone] Nausea And Vomiting   . Pravastatin     myalgia  . Rosuvastatin     myalgia  . Bystolic [Nebivolol Hcl] Other (See Comments)    Extreme fatigue, not able to focus  . Codeine Nausea And Vomiting  . Iodine Rash  . Metoprolol Other (See Comments)    Made patient very fatigued, could not focus    Review of Systems   Lower third sternal drainage mainly fat necrosis with some mild erythema-cellulitis.  BP 125/64 (BP Location: Left Arm, Patient Position: Sitting)   Pulse (!) 106   Temp 98.4 F (36.9 C)   Resp 20   Ht 5\' 8"  (1.727 m)   Wt 147 lb 9.6 oz (67 kg)   SpO2 95% Comment: RA with mask on  BMI 22.44 kg/m  Physical Exam Alert and  oriented Heart rate regular Lungs clear Sternum stable without movement or clicking Erythema around the lower third with a subcutaneous defect and some fat necrosis.  This was prepped and opened and drained and packed with a 2 x 2 wet-to-dry gauze. Right leg incision healing well No peripheral edema Neuro intact except for some mild weakness in his left hand grip  Diagnostic Tests: Chest x-ray images personally reviewed with results as noted above  Impression: Doing well after multivessel CABG for severe LAD proximal stenosis Superficial sternal wound nonhealing with cellulitis and mild drainage Plan: The sternal wound will need daily packing with saline wet-to-dry.  We will continue another week of antibiotics. Patient will return to the office tomorrow to have his dressing changed.  We will teach the wife how to pack the wound with sterile technique.  We will continue to have no driving and no lifting more than 10 pounds.  Len Childs, MD [facility] [phone]

## 2020-05-24 ENCOUNTER — Encounter: Payer: Self-pay | Admitting: Cardiothoracic Surgery

## 2020-05-24 ENCOUNTER — Ambulatory Visit (INDEPENDENT_AMBULATORY_CARE_PROVIDER_SITE_OTHER): Payer: Self-pay | Admitting: Cardiothoracic Surgery

## 2020-05-24 ENCOUNTER — Ambulatory Visit: Payer: BC Managed Care – PPO | Admitting: Cardiology

## 2020-05-24 VITALS — BP 133/74 | HR 96 | Temp 98.6°F | Resp 20 | Ht 68.0 in | Wt 150.0 lb

## 2020-05-24 DIAGNOSIS — Z951 Presence of aortocoronary bypass graft: Secondary | ICD-10-CM

## 2020-05-24 DIAGNOSIS — H43811 Vitreous degeneration, right eye: Secondary | ICD-10-CM | POA: Diagnosis not present

## 2020-05-24 DIAGNOSIS — Z5189 Encounter for other specified aftercare: Secondary | ICD-10-CM

## 2020-05-24 DIAGNOSIS — Z4801 Encounter for change or removal of surgical wound dressing: Secondary | ICD-10-CM | POA: Insufficient documentation

## 2020-05-24 DIAGNOSIS — H43392 Other vitreous opacities, left eye: Secondary | ICD-10-CM | POA: Diagnosis not present

## 2020-05-24 DIAGNOSIS — L03313 Cellulitis of chest wall: Secondary | ICD-10-CM

## 2020-05-24 DIAGNOSIS — H2513 Age-related nuclear cataract, bilateral: Secondary | ICD-10-CM | POA: Diagnosis not present

## 2020-05-24 MED ORDER — OXYCODONE HCL 5 MG PO TABS
5.0000 mg | ORAL_TABLET | ORAL | 0 refills | Status: DC | PRN
Start: 2020-05-24 — End: 2020-06-22

## 2020-05-24 NOTE — Progress Notes (Signed)
PCP is Tally Joe, MD Referring Provider is Tally Joe, MD  Chief Complaint  Patient presents with  . Routine Post Op    HX of CABG, surgical wound check     HPI: The patient returns for wound check and dressing change. The patient developed separation of the lower aspect of the sternal incision with some fat necrosis type drainage.  There is surrounding erythema and the patient has been given a course of oral Keflex.  Patient is very thin without subcutaneous tissue and the opening extends down to the presternal fascia.  I did the dressing change, repacking the 2.5 cm x 1 cm opening with iodoform gauze and a dry dressing.  The patient will have this dressing change daily in the office until we can arrange a home health nurse to assist in the wound care.  He will continue another empiric course of oral Keflex.   Past Medical History:  Diagnosis Date  . Allergy   . Arthritis   . GERD (gastroesophageal reflux disease)    occ  . Heart murmur    baby  . High cholesterol   . Hypertension   . Pneumonia   . Right carotid bruit 06/10/2017  . Sciatica   . Seasonal allergies   . Sinus tachycardia   . Spinal stenosis     Past Surgical History:  Procedure Laterality Date  . CARPAL TUNNEL WITH CUBITAL TUNNEL Left 11/10/2013   Procedure: LEFT CARPAL TUNNEL RELEASE;  Surgeon: Wyn Forster, MD;  Location: [facility];  Service: Orthopedics;  Laterality: Left;  . COLONOSCOPY    . CORONARY ARTERY BYPASS GRAFT N/A 05/01/2020   Procedure: CORONARY ARTERY BYPASS GRAFTING (CABG) X 2 ON CARDIOPULMONARY BYPASS. LIMA TO LAD, SVG TO DIAG;  Surgeon: Kerin Perna, MD;  Location: [facility] OR;  Service: Open Heart Surgery;  Laterality: N/A;  . ENDOVEIN HARVEST OF GREATER SAPHENOUS VEIN Right 05/01/2020   Procedure: ENDOVEIN HARVEST OF GREATER SAPHENOUS VEIN;  Surgeon: Kerin Perna, MD;  Location: [facility] OR;  Service: Open Heart Surgery;  Laterality: Right;  . KNEE ARTHROSCOPY   [id number]   left and right  . LUMBAR LAMINECTOMY/DECOMPRESSION MICRODISCECTOMY Left 09/29/2017   Procedure: LEFT LUMBAR TWO - LUMBAR THREELAMINOTOMY/MICRODISCECTOMY;  Surgeon: Shirlean Kelly, MD;  Location: [name] OR;  Service: Neurosurgery;  Laterality: Left;  LEFT LUMBAR 2- LUMBAR 3 LAMINOTOMY/MICRODISCECTOMY  . NASAL SINUS SURGERY  05/2015  . RIGHT/LEFT HEART CATH AND CORONARY ANGIOGRAPHY N/A 04/10/2020   Procedure: RIGHT/LEFT HEART CATH AND CORONARY ANGIOGRAPHY;  Surgeon: Lennette Bihari, MD;  Location: [facility];  Service: Cardiovascular;  Laterality: N/A;  . TEE WITHOUT CARDIOVERSION N/A 05/01/2020   Procedure: TRANSESOPHAGEAL ECHOCARDIOGRAM (TEE);  Surgeon: Donata Clay, Theron Arista, MD;  Location: [facility] OR;  Service: Open Heart Surgery;  Laterality: N/A;  . TOTAL KNEE ARTHROPLASTY Right 06/24/2016   Procedure: TOTAL KNEE ARTHROPLASTY;  Surgeon: Valeria Batman, MD;  Location: [facility] OR;  Service: Orthopedics;  Laterality: Right;  . TRIGGER FINGER RELEASE Left 11/10/2013   Procedure: LEFT INDEX A-1 PULLEY RELEASE;  Surgeon: Wyn Forster, MD;  Location: [facility];  Service: Orthopedics;  Laterality: Left;  . ULNAR NERVE TRANSPOSITION Left 11/10/2013   Procedure: ULNAR NERVE TRANSPOSITION;  Surgeon: Wyn Forster, MD;  Location:  SURGERY CENTER;  Service: Orthopedics;  Laterality: Left;    History reviewed. No pertinent family history.  Social History Social History   Tobacco Use  . Smoking status:  Former Smoker    Packs/day: 1.00    Years: 37.00    Pack years: 37.00    Types: Cigarettes    Quit date: 05/04/2016    Years since quitting: 4.0  . Smokeless tobacco: Never Used  Vaping Use  . Vaping Use: Former  Substance Use Topics  . Alcohol use: Yes    Alcohol/week: 3.0 standard drinks    Types: 3 Glasses of wine per week    Comment: weekends only with dinner  . Drug use: No    Current Outpatient Medications  Medication Sig Dispense Refill  .  ALPRAZolam (XANAX) 0.5 MG tablet Take 1 tablet (0.5 mg total) by mouth 2 (two) times daily as needed for up to 10 days for anxiety. 20 tablet 0  . aspirin EC 325 MG EC tablet Take 1 tablet (325 mg total) by mouth daily.    . cephALEXin (KEFLEX) 500 MG capsule Take 1 capsule (500 mg total) by mouth 3 (three) times daily for 7 days. 21 capsule 0  . diclofenac Sodium (VOLTAREN) 1 % GEL Apply 2 g topically daily as needed (Knee and back pain).    Marland Kitchen diltiazem (CARDIZEM CD) 240 MG 24 hr capsule Take 1 capsule (240 mg total) by mouth daily. 90 capsule 3  . diphenhydrAMINE (BENADRYL) 25 MG tablet Take 12.5 mg by mouth at bedtime.    . ferrous [id number] C-folic acid (TRINSICON / FOLTRIN) capsule Take 1 capsule by mouth 2 (two) times daily after a meal. 60 capsule 1  . fluticasone (FLONASE) 50 MCG/ACT nasal spray Place 2 sprays into both nostrils daily as needed for allergies.     . Multiple Vitamin (MULTIVITAMIN WITH MINERALS) TABS tablet Take 1 tablet by mouth daily.    Marland Kitchen tetrahydrozoline 0.05 % ophthalmic solution Place 1 drop into both eyes 3 (three) times daily as needed (for dry/irritated eyes.).    Marland Kitchen oxyCODONE (OXY IR/ROXICODONE) 5 MG immediate release tablet Take 1 tablet (5 mg total) by mouth every 4 (four) hours as needed for severe pain. 20 tablet 0   No current facility-administered medications for this visit.    Allergies  Allergen Reactions  . Mushroom Extract Complex Anaphylaxis  . Penicillins Shortness Of Breath    Childhood allergy.  Has patient had a PCN reaction causing immediate rash, facial/tongue/throat swelling, SOB or lightheadedness with hypotension: No Has patient had a PCN reaction causing severe rash involving mucus membranes or skin necrosis: No Has patient had a PCN reaction that required hospitalization No Has patient had a PCN reaction occurring within the last 10 years: No If all of the above answers are "NO", then may proceed with C  . Atorvastatin      myalgia  . Chantix [Varenicline]     Other reaction(s): headaches  . Dilaudid [Hydromorphone] Nausea And Vomiting  . Pravastatin     myalgia  . Rosuvastatin     myalgia  . Bystolic [Nebivolol Hcl] Other (See Comments)    Extreme fatigue, not able to focus  . Codeine Nausea And Vomiting  . Iodine Rash  . Metoprolol Other (See Comments)    Made patient very fatigued, could not focus    Review of Systems No fever Complains of soreness at the wound site. BP 133/74   Pulse 96   Temp 98.6 F (37 C) (Skin)   Resp 20   Ht 5\' 8"  (1.727 m)   Wt 150 lb (68 kg)   SpO2 96% Comment: RA  BMI 22.81 kg/m  Physical Exam Alert and comfortable No sternal instability Heart rhythm regular without murmur Lungs clear Sternum well-healed above and below the area of skin separation  Diagnostic Tests: None  Impression: Superficial lower sternal wound separation and cellulitis  Plan: Continue oral antibiotics and daily packing with iodoform gauze.  Return to office clinic for dressing change tomorrow. Return for wound check and dressing change on Monday, January 10 Len Childs, MD [facility] [phone]

## 2020-05-25 ENCOUNTER — Ambulatory Visit (INDEPENDENT_AMBULATORY_CARE_PROVIDER_SITE_OTHER): Payer: Self-pay

## 2020-05-25 ENCOUNTER — Other Ambulatory Visit: Payer: Self-pay

## 2020-05-25 DIAGNOSIS — Z4801 Encounter for change or removal of surgical wound dressing: Secondary | ICD-10-CM

## 2020-05-28 ENCOUNTER — Other Ambulatory Visit: Payer: Self-pay

## 2020-05-28 ENCOUNTER — Telehealth: Payer: Self-pay | Admitting: Orthopaedic Surgery

## 2020-05-28 ENCOUNTER — Encounter: Payer: Self-pay | Admitting: Cardiothoracic Surgery

## 2020-05-28 ENCOUNTER — Ambulatory Visit (INDEPENDENT_AMBULATORY_CARE_PROVIDER_SITE_OTHER): Payer: Self-pay | Admitting: Cardiothoracic Surgery

## 2020-05-28 VITALS — BP 124/72 | HR 110

## 2020-05-28 DIAGNOSIS — T8131XA Disruption of external operation (surgical) wound, not elsewhere classified, initial encounter: Secondary | ICD-10-CM | POA: Diagnosis not present

## 2020-05-28 DIAGNOSIS — S21101A Unspecified open wound of right front wall of thorax without penetration into thoracic cavity, initial encounter: Secondary | ICD-10-CM

## 2020-05-28 DIAGNOSIS — L089 Local infection of the skin and subcutaneous tissue, unspecified: Secondary | ICD-10-CM

## 2020-05-28 DIAGNOSIS — L03313 Cellulitis of chest wall: Secondary | ICD-10-CM | POA: Diagnosis not present

## 2020-05-28 DIAGNOSIS — Z5189 Encounter for other specified aftercare: Secondary | ICD-10-CM | POA: Diagnosis not present

## 2020-05-28 DIAGNOSIS — Z951 Presence of aortocoronary bypass graft: Secondary | ICD-10-CM | POA: Diagnosis not present

## 2020-05-28 MED ORDER — CEPHALEXIN 500 MG PO CAPS: 500.0000 mg | ORAL_CAPSULE | Freq: Three times a day (TID) | ORAL | 0 refills | Status: AC

## 2020-05-28 NOTE — Telephone Encounter (Signed)
Pt just had surgery and he said his left knee is really swollen and he can barley walk and the doctor recommended for him to walk more. Please call him [phone]

## 2020-05-28 NOTE — Progress Notes (Signed)
PCP is Antony Contras, MD Referring Provider is Jerline Pain, MD  No chief complaint on file.   HPI: The patient returns to the office for a wound dressing change and assessment.  He developed a 2.5 x 1 cm wide opening in the lower third of the sternal incision.  His insurance will not cover home health nursing wound care and we are following the patient daily in the office with dressing changes.  He has completed a course of oral Keflex with improvement of the surrounding erythema in the skin.  The pain and tenderness is improved.  He is trying to maximize his protein intake.  He denies any fever.  Today I did the dressing change myself with some debridement of fat necrosis of the subcutaneous fat.  There is no tunneling.  The sternum is not exposed.  There is no instability or rocking.  I remove the Xeroform packing in place 2 x 2 saline wet-to-dry dressing.  Otherwise the patient has no new complaints   Past Medical History:  Diagnosis Date  . Allergy   . Arthritis   . GERD (gastroesophageal reflux disease)    occ  . Heart murmur    baby  . High cholesterol   . Hypertension   . Pneumonia   . Right carotid bruit 06/10/2017  . Sciatica   . Seasonal allergies   . Sinus tachycardia   . Spinal stenosis     Past Surgical History:  Procedure Laterality Date  . CARPAL TUNNEL WITH CUBITAL TUNNEL Left 11/10/2013   Procedure: LEFT CARPAL TUNNEL RELEASE;  Surgeon: Cammie Sickle, MD;  Location: [place];  Service: Orthopedics;  Laterality: Left;  . COLONOSCOPY    . CORONARY ARTERY BYPASS GRAFT N/A 05/01/2020   Procedure: CORONARY ARTERY BYPASS GRAFTING (CABG) X 2 ON CARDIOPULMONARY BYPASS. LIMA TO LAD, SVG TO DIAG;  Surgeon: Ivin Poot, MD;  Location: [place];  Service: Open Heart Surgery;  Laterality: N/A;  . ENDOVEIN HARVEST OF GREATER SAPHENOUS VEIN Right 05/01/2020   Procedure: ENDOVEIN HARVEST OF GREATER SAPHENOUS VEIN;  Surgeon: Ivin Poot, MD;   Location: [place];  Service: Open Heart Surgery;  Laterality: Right;  . KNEE ARTHROSCOPY  [id number]   left and right  . LUMBAR LAMINECTOMY/DECOMPRESSION MICRODISCECTOMY Left 09/29/2017   Procedure: LEFT LUMBAR TWO - LUMBAR THREELAMINOTOMY/MICRODISCECTOMY;  Surgeon: Jovita Gamma, MD;  Location: [employer] [facility];  Service: Neurosurgery;  Laterality: Left;  LEFT LUMBAR 2- LUMBAR 3 LAMINOTOMY/MICRODISCECTOMY  . NASAL SINUS SURGERY  05/2015  . RIGHT/LEFT HEART CATH AND CORONARY ANGIOGRAPHY N/A 04/10/2020   Procedure: RIGHT/LEFT HEART CATH AND CORONARY ANGIOGRAPHY;  Surgeon: Troy Sine, MD;  Location: [place] [facility];  Service: Cardiovascular;  Laterality: N/A;  . TEE WITHOUT CARDIOVERSION N/A 05/01/2020   Procedure: TRANSESOPHAGEAL ECHOCARDIOGRAM (TEE);  Surgeon: Prescott Gum, Collier Salina, MD;  Location: [place];  Service: Open Heart Surgery;  Laterality: N/A;  . TOTAL KNEE ARTHROPLASTY Right 06/24/2016   Procedure: TOTAL KNEE ARTHROPLASTY;  Surgeon: Garald Balding, MD;  Location: [place];  Service: Orthopedics;  Laterality: Right;  . TRIGGER FINGER RELEASE Left 11/10/2013   Procedure: LEFT INDEX A-1 PULLEY RELEASE;  Surgeon: Cammie Sickle, MD;  Location: [place];  Service: Orthopedics;  Laterality: Left;  . ULNAR NERVE TRANSPOSITION Left 11/10/2013   Procedure: ULNAR NERVE TRANSPOSITION;  Surgeon: Cammie Sickle, MD;  Location: [place];  Service: Orthopedics;  Laterality: Left;  History reviewed. No pertinent family history.  Social History Social History   Tobacco Use  . Smoking status: Former Smoker    Packs/day: 1.00    Years: 37.00    Pack years: 37.00    Types: Cigarettes    Quit date: 05/04/2016    Years since quitting: 4.0  . Smokeless tobacco: Never Used  Vaping Use  . Vaping Use: Former  Substance Use Topics  . Alcohol use: Yes    Alcohol/week: 3.0 standard drinks    Types: 3 Glasses of wine per week    Comment: weekends only with dinner  .  Drug use: No    Current Outpatient Medications  Medication Sig Dispense Refill  . ALPRAZolam (XANAX) 0.5 MG tablet Take 1 tablet (0.5 mg total) by mouth 2 (two) times daily as needed for up to 10 days for anxiety. 20 tablet 0  . aspirin EC 325 MG EC tablet Take 1 tablet (325 mg total) by mouth daily.    . cephALEXin (KEFLEX) 500 MG capsule Take 1 capsule (500 mg total) by mouth 3 (three) times daily for 7 days. 21 capsule 0  . diclofenac Sodium (VOLTAREN) 1 % GEL Apply 2 g topically daily as needed (Knee and back pain).    Marland Kitchen diltiazem (CARDIZEM CD) 240 MG 24 hr capsule Take 1 capsule (240 mg total) by mouth daily. 90 capsule 3  . diphenhydrAMINE (BENADRYL) 25 MG tablet Take 12.5 mg by mouth at bedtime.    . ferrous [id number] C-folic acid (TRINSICON / FOLTRIN) capsule Take 1 capsule by mouth 2 (two) times daily after a meal. 60 capsule 1  . fluticasone (FLONASE) 50 MCG/ACT nasal spray Place 2 sprays into both nostrils daily as needed for allergies.     . Multiple Vitamin (MULTIVITAMIN WITH MINERALS) TABS tablet Take 1 tablet by mouth daily.    Marland Kitchen oxyCODONE (OXY IR/ROXICODONE) 5 MG immediate release tablet Take 1 tablet (5 mg total) by mouth every 4 (four) hours as needed for severe pain. 20 tablet 0  . tetrahydrozoline 0.05 % ophthalmic solution Place 1 drop into both eyes 3 (three) times daily as needed (for dry/irritated eyes.).     No current facility-administered medications for this visit.    Allergies  Allergen Reactions  . Mushroom Extract Complex Anaphylaxis  . Penicillins Shortness Of Breath    Childhood allergy.  Has patient had a PCN reaction causing immediate rash, facial/tongue/throat swelling, SOB or lightheadedness with hypotension: No Has patient had a PCN reaction causing severe rash involving mucus membranes or skin necrosis: No Has patient had a PCN reaction that required hospitalization No Has patient had a PCN reaction occurring within the last 10 years:  No If all of the above answers are "NO", then may proceed with C  . Atorvastatin     myalgia  . Chantix [Varenicline]     Other reaction(s): headaches  . Dilaudid [Hydromorphone] Nausea And Vomiting  . Pravastatin     myalgia  . Rosuvastatin     myalgia  . Bystolic [Nebivolol Hcl] Other (See Comments)    Extreme fatigue, not able to focus  . Codeine Nausea And Vomiting  . Iodine Rash  . Metoprolol Other (See Comments)    Made patient very fatigued, could not focus    Review of Systems No fever Shortness of breath No edema Stable  BP 124/72   Pulse (!) 110  Physical Exam      Exam    General- alert and comfortable.  Sternal dressing change personally with beginnings of some granulation tissue after sharp debridement of some subcutaneous fat necrosis.    Neck- no JVD, no cervical adenopathy palpable, no carotid bruit   Lungs- clear without rales, wheezes   Cor- regular rate and rhythm, no murmur , gallop   Abdomen- soft, non-tender   Extremities - warm, non-tender, minimal edema   Neuro- oriented, appropriate, no focal weakness   Diagnostic Tests: Wound culture taken  Impression: Continue daily dressing changes in the office.  The patient's wife is unable to perform the dressing change reliably. Plan: Return tomorrow for dressing change Renew Keflex another week 500 mg p.o. 3 times daily  Len Childs, MD [facility] [phone]

## 2020-05-28 NOTE — Progress Notes (Signed)
Sternal wound CX sent to [facility]

## 2020-05-28 NOTE — Telephone Encounter (Signed)
Please call and advise patient on what to do.

## 2020-05-28 NOTE — Addendum Note (Signed)
Addended by: Floyde Parkins MD, Jenilee Franey on: 05/28/2020 12:15 PM   Modules accepted: Orders, Level of Service

## 2020-05-28 NOTE — Telephone Encounter (Signed)
called

## 2020-05-29 ENCOUNTER — Encounter: Payer: Self-pay | Admitting: Orthopaedic Surgery

## 2020-05-29 ENCOUNTER — Ambulatory Visit (INDEPENDENT_AMBULATORY_CARE_PROVIDER_SITE_OTHER): Payer: Self-pay | Admitting: Cardiothoracic Surgery

## 2020-05-29 ENCOUNTER — Ambulatory Visit (INDEPENDENT_AMBULATORY_CARE_PROVIDER_SITE_OTHER): Payer: BC Managed Care – PPO | Admitting: Orthopaedic Surgery

## 2020-05-29 ENCOUNTER — Encounter: Payer: Self-pay | Admitting: Cardiothoracic Surgery

## 2020-05-29 VITALS — Ht 68.0 in | Wt 150.0 lb

## 2020-05-29 VITALS — BP 123/80 | HR 88 | Resp 20 | Ht 68.0 in

## 2020-05-29 DIAGNOSIS — M1712 Unilateral primary osteoarthritis, left knee: Secondary | ICD-10-CM

## 2020-05-29 DIAGNOSIS — Z951 Presence of aortocoronary bypass graft: Secondary | ICD-10-CM

## 2020-05-29 DIAGNOSIS — S21101A Unspecified open wound of right front wall of thorax without penetration into thoracic cavity, initial encounter: Secondary | ICD-10-CM

## 2020-05-29 DIAGNOSIS — T8131XA Disruption of external operation (surgical) wound, not elsewhere classified, initial encounter: Secondary | ICD-10-CM

## 2020-05-29 DIAGNOSIS — M170 Bilateral primary osteoarthritis of knee: Secondary | ICD-10-CM | POA: Diagnosis not present

## 2020-05-29 DIAGNOSIS — L089 Local infection of the skin and subcutaneous tissue, unspecified: Secondary | ICD-10-CM

## 2020-05-29 MED ORDER — BUPIVACAINE HCL 0.25 % IJ SOLN
2.0000 mL | INTRAMUSCULAR | Status: AC | PRN
  Administered 2020-05-29: 2 mL via INTRA_ARTICULAR

## 2020-05-29 NOTE — Progress Notes (Signed)
Office Visit Note   Patient: Philip Winters           Date of Birth: March 21, 1961           MRN: [id number] Visit Date: 05/29/2020              Requested by: Philip Contras, MD Philip Winters,  [place] [address] PCP: Philip Contras, MD   Assessment & Plan: Visit Diagnoses:  1. Bilateral primary osteoarthritis of knee   2. Primary osteoarthritis of left knee     Plan: Philip Winters recently had a coronary artery bypass procedure and is doing well.  However postoperatively his left arthritic knee "swelled up".  He is trying to get involved with cardiac rehab and having a difficult time because of his knee.  He does have what appears to be end-stage arthritis.  He has been through cortisone injections and more recently viscosupplementation but still having recurrent problems.  I will inject his knee today and see him back as needed  Follow-Up Instructions: Return if symptoms worsen or fail to improve.   Orders:  Orders Placed This Encounter  Procedures  . Large Joint Inj: L knee   No orders of the defined types were placed in this encounter.     Procedures: Large Joint Inj: L knee on 05/29/2020 1:20 PM Indications: pain and diagnostic evaluation Details: 25 G 1.5 in needle, anteromedial approach  Arthrogram: No  Medications: 2 mL bupivacaine 0.25 %  2 mL betamethasone injected in the left knee with Marcaine Procedure, treatment alternatives, risks and benefits explained, specific risks discussed. Consent was given by the patient. Patient was prepped and draped in the usual sterile fashion.       Clinical Data: No additional findings.   Subjective: Chief Complaint  Patient presents with  . Left Knee - Pain  Patient presents today for left knee pain. He was here last in November of 2022 and had his left knee injected with cortisone. He states that the injection helped for a month, and now has pain again along with a limp. He had double bypass surgery on  05/01/2020. He has a wound in that area and takes oxycodone as needed for wound care.  He is taking aspirin but no other blood thinners  HPI  Review of Systems   Objective: Vital Signs: Ht 5\' 8"  (1.727 m)   Wt 150 lb (68 kg)   BMI 22.81 kg/m   Physical Exam Constitutional:      Appearance: He is well-developed and well-nourished.  HENT:     Mouth/Throat:     Mouth: Oropharynx is clear and moist.  Eyes:     Extraocular Movements: EOM normal.     Pupils: Pupils are equal, round, and reactive to light.  Pulmonary:     Effort: Pulmonary effort is normal.  Skin:    General: Skin is warm and dry.  Neurological:     Mental Status: He is alert and oriented to person, place, and time.  Psychiatric:        Mood and Affect: Mood and affect normal.        Behavior: Behavior normal.     Ortho Exam Awake alert and oriented x3.  Comfortable sitting.  Left knee was not hot warm or red.  No effusion.  Lacked a few degrees to full extension and flexed over 100 degrees without instability.  Mostly medial joint pain with some varus on weightbearing.  No popliteal pain or  mass.  No calf pain or distal edema Specialty Comments:  No specialty comments available.  Imaging: No results found.   PMFS History: Patient Active Problem List   Diagnosis Date Noted  . Dressing change or removal, surgical wound 05/24/2020  . Pulmonary nodule, left 05/23/2020  . Cellulitis 05/23/2020  . S/P CABG x 2 05/02/2020  . Coronary artery disease 05/01/2020  . Pulmonary nodule 1 cm or greater in diameter 04/26/2020  . Statin intolerance 04/20/2020  . Coronary atherosclerosis of native coronary artery 04/18/2020  . Abnormal cardiovascular stress test   . Nonrheumatic aortic valve stenosis   . Exertional dyspnea   . Stable angina pectoris ([place])   . Bicuspid aortic valve 04/06/2020  . Primary osteoarthritis of left knee 11/08/2019  . Bilateral primary osteoarthritis of knee 09/28/2019  . HNP (herniated  nucleus pulposus), lumbar 09/29/2017  . Right carotid bruit 06/10/2017  . Paresthesia 06/10/2017  . Left wrist pain 10/09/2016  . Unilateral primary osteoarthritis, right knee 06/24/2016  . S/P total knee replacement using cement, right 06/24/2016  . Acute pain of left knee 03/27/2016  . Mixed hyperlipidemia 01/18/2007  . HYPERTENSION 01/18/2007   Past Medical History:  Diagnosis Date  . Allergy   . Arthritis   . GERD (gastroesophageal reflux disease)    occ  . Heart murmur    baby  . High cholesterol   . Hypertension   . Pneumonia   . Right carotid bruit 06/10/2017  . Sciatica   . Seasonal allergies   . Sinus tachycardia   . Spinal stenosis     History reviewed. No pertinent family history.  Past Surgical History:  Procedure Laterality Date  . CARPAL TUNNEL WITH CUBITAL TUNNEL Left 11/10/2013   Procedure: LEFT CARPAL TUNNEL RELEASE;  Surgeon: Cammie Sickle, MD;  Location: [place];  Service: Orthopedics;  Laterality: Left;  . COLONOSCOPY    . CORONARY ARTERY BYPASS GRAFT N/A 05/01/2020   Procedure: CORONARY ARTERY BYPASS GRAFTING (CABG) X 2 ON CARDIOPULMONARY BYPASS. LIMA TO LAD, SVG TO DIAG;  Surgeon: Ivin Poot, MD;  Location: [place];  Service: Open Heart Surgery;  Laterality: N/A;  . ENDOVEIN HARVEST OF GREATER SAPHENOUS VEIN Right 05/01/2020   Procedure: ENDOVEIN HARVEST OF GREATER SAPHENOUS VEIN;  Surgeon: Ivin Poot, MD;  Location: [place];  Service: Open Heart Surgery;  Laterality: Right;  . KNEE ARTHROSCOPY  [id number]   left and right  . LUMBAR LAMINECTOMY/DECOMPRESSION MICRODISCECTOMY Left 09/29/2017   Procedure: LEFT LUMBAR TWO - LUMBAR THREELAMINOTOMY/MICRODISCECTOMY;  Surgeon: Jovita Gamma, MD;  Location: [place];  Service: Neurosurgery;  Laterality: Left;  LEFT LUMBAR 2- LUMBAR 3 LAMINOTOMY/MICRODISCECTOMY  . NASAL SINUS SURGERY  05/2015  . RIGHT/LEFT HEART CATH AND CORONARY ANGIOGRAPHY N/A 04/10/2020   Procedure: RIGHT/LEFT HEART  CATH AND CORONARY ANGIOGRAPHY;  Surgeon: Troy Sine, MD;  Location: [place] [facility];  Service: Cardiovascular;  Laterality: N/A;  . TEE WITHOUT CARDIOVERSION N/A 05/01/2020   Procedure: TRANSESOPHAGEAL ECHOCARDIOGRAM (TEE);  Surgeon: Prescott Gum, Collier Salina, MD;  Location: [place];  Service: Open Heart Surgery;  Laterality: N/A;  . TOTAL KNEE ARTHROPLASTY Right 06/24/2016   Procedure: TOTAL KNEE ARTHROPLASTY;  Surgeon: Garald Balding, MD;  Location: [place];  Service: Orthopedics;  Laterality: Right;  . TRIGGER FINGER RELEASE Left 11/10/2013   Procedure: LEFT INDEX A-1 PULLEY RELEASE;  Surgeon: Cammie Sickle, MD;  Location: [place];  Service: Orthopedics;  Laterality: Left;  . ULNAR  NERVE TRANSPOSITION Left 11/10/2013   Procedure: ULNAR NERVE TRANSPOSITION;  Surgeon: Cammie Sickle, MD;  Location: [place];  Service: Orthopedics;  Laterality: Left;   Social History   Occupational History  . Occupation: Self employed  Tobacco Use  . Smoking status: Former Smoker    Packs/day: 1.00    Years: 37.00    Pack years: 37.00    Types: Cigarettes    Quit date: 05/04/2016    Years since quitting: 4.0  . Smokeless tobacco: Never Used  Vaping Use  . Vaping Use: Former  Substance and Sexual Activity  . Alcohol use: Yes    Alcohol/week: 3.0 standard drinks    Types: 3 Glasses of wine per week    Comment: weekends only with dinner  . Drug use: No  . Sexual activity: Not on file

## 2020-05-29 NOTE — Progress Notes (Signed)
PCP is Philip Contras, MD Referring Provider is Philip Pain, MD  Chief Complaint  Patient presents with  . Routine Post Op    Sternal wound dressing change, HX of CABG    HPI: The patient presents for wound check and dressing change he has a superficial skin separation with cellulitis at the lower sternal incision.  It measures 2.5 cm x 1 cm.  Wound culture taken yesterday has not been reported.  He is on empiric Keflex 500 3 times daily.  No drainage fever and improved Winters.  I performed the dressing change myself.  The wound is significantly cleaning up and starting to granulate.  1 Vicryl suture was removed.  The sternum does not visible.  Surrounding cellulitis is improved.  I placed a single 2 x 2 saline wet-to-dry gauze in the defect, covered with dry dressings and a Tegaderm. I instructed the wife to perform this on a daily basis and we will follow him in the clinic. He will continue his Keflex.  Past Medical History:  Diagnosis Date  . Allergy   . Arthritis   . GERD (gastroesophageal reflux disease)    occ  . Heart murmur    baby  . High cholesterol   . Hypertension   . Pneumonia   . Right carotid bruit 06/10/2017  . Sciatica   . Seasonal allergies   . Sinus tachycardia   . Spinal stenosis     Past Surgical History:  Procedure Laterality Date  . CARPAL TUNNEL WITH CUBITAL TUNNEL Left 11/10/2013   Procedure: LEFT CARPAL TUNNEL RELEASE;  Surgeon: Cammie Sickle, MD;  Location: [facility];  Service: Orthopedics;  Laterality: Left;  . COLONOSCOPY    . CORONARY ARTERY BYPASS GRAFT N/A 05/01/2020   Procedure: CORONARY ARTERY BYPASS GRAFTING (CABG) X 2 ON CARDIOPULMONARY BYPASS. LIMA TO LAD, SVG TO DIAG;  Surgeon: Ivin Poot, MD;  Location: [facility];  Service: Open Heart Surgery;  Laterality: N/A;  . ENDOVEIN HARVEST OF GREATER SAPHENOUS VEIN Right 05/01/2020   Procedure: ENDOVEIN HARVEST OF GREATER SAPHENOUS VEIN;  Surgeon: Ivin Poot, MD;   Location: [facility];  Service: Open Heart Surgery;  Laterality: Right;  . KNEE ARTHROSCOPY  [id number]   left and right  . LUMBAR LAMINECTOMY/DECOMPRESSION MICRODISCECTOMY Left 09/29/2017   Procedure: LEFT LUMBAR TWO - LUMBAR THREELAMINOTOMY/MICRODISCECTOMY;  Surgeon: Jovita Gamma, MD;  Location: [facility];  Service: Neurosurgery;  Laterality: Left;  LEFT LUMBAR 2- LUMBAR 3 LAMINOTOMY/MICRODISCECTOMY  . NASAL SINUS SURGERY  05/2015  . RIGHT/LEFT HEART CATH AND CORONARY ANGIOGRAPHY N/A 04/10/2020   Procedure: RIGHT/LEFT HEART CATH AND CORONARY ANGIOGRAPHY;  Surgeon: Troy Sine, MD;  Location: [facility];  Service: Cardiovascular;  Laterality: N/A;  . TEE WITHOUT CARDIOVERSION N/A 05/01/2020   Procedure: TRANSESOPHAGEAL ECHOCARDIOGRAM (TEE);  Surgeon: Prescott Gum, Collier Salina, MD;  Location: [facility];  Service: Open Heart Surgery;  Laterality: N/A;  . TOTAL KNEE ARTHROPLASTY Right 06/24/2016   Procedure: TOTAL KNEE ARTHROPLASTY;  Surgeon: Garald Balding, MD;  Location: [facility];  Service: Orthopedics;  Laterality: Right;  . TRIGGER FINGER RELEASE Left 11/10/2013   Procedure: LEFT INDEX A-1 PULLEY RELEASE;  Surgeon: Cammie Sickle, MD;  Location: [facility];  Service: Orthopedics;  Laterality: Left;  . ULNAR NERVE TRANSPOSITION Left 11/10/2013   Procedure: ULNAR NERVE TRANSPOSITION;  Surgeon: Cammie Sickle, MD;  Location: [facility];  Service: Orthopedics;  Laterality: Left;  History reviewed. No pertinent family history.  Social History Social History   Tobacco Use  . Smoking status: Former Smoker    Packs/day: 1.00    Years: 37.00    Pack years: 37.00    Types: Cigarettes    Quit date: 05/04/2016    Years since quitting: 4.0  . Smokeless tobacco: Never Used  Vaping Use  . Vaping Use: Former  Substance Use Topics  . Alcohol use: Yes    Alcohol/week: 3.0 standard drinks    Types: 3 Glasses of wine per week    Comment: weekends only with dinner  .  Drug use: No    Current Outpatient Medications  Medication Sig Dispense Refill  . ALPRAZolam (XANAX) 0.5 MG tablet Take 1 tablet (0.5 mg total) by mouth 2 (two) times daily as needed for up to 10 days for anxiety. 20 tablet 0  . aspirin EC 325 MG EC tablet Take 1 tablet (325 mg total) by mouth daily.    . cephALEXin (KEFLEX) 500 MG capsule Take 1 capsule (500 mg total) by mouth 3 (three) times daily for 7 days. 21 capsule 0  . diclofenac Sodium (VOLTAREN) 1 % GEL Apply 2 g topically daily as needed (Knee and back Winters).    Marland Kitchen diltiazem (CARDIZEM CD) 240 MG 24 hr capsule Take 1 capsule (240 mg total) by mouth daily. 90 capsule 3  . diphenhydrAMINE (BENADRYL) 25 MG tablet Take 12.5 mg by mouth at bedtime.    . ferrous [id number] C-folic acid (TRINSICON / FOLTRIN) capsule Take 1 capsule by mouth 2 (two) times daily after a meal. 60 capsule 1  . fluticasone (FLONASE) 50 MCG/ACT nasal spray Place 2 sprays into both nostrils daily as needed for allergies.     . Multiple Vitamin (MULTIVITAMIN WITH MINERALS) TABS tablet Take 1 tablet by mouth daily.    Marland Kitchen oxyCODONE (OXY IR/ROXICODONE) 5 MG immediate release tablet Take 1 tablet (5 mg total) by mouth every 4 (four) hours as needed for severe Winters. 20 tablet 0  . tetrahydrozoline 0.05 % ophthalmic solution Place 1 drop into both eyes 3 (three) times daily as needed (for dry/irritated eyes.).     No current facility-administered medications for this visit.    Allergies  Allergen Reactions  . Mushroom Extract Complex Anaphylaxis  . Penicillins Shortness Of Breath    Childhood allergy.  Has patient had a PCN reaction causing immediate rash, facial/tongue/throat swelling, SOB or lightheadedness with hypotension: No Has patient had a PCN reaction causing severe rash involving mucus membranes or skin necrosis: No Has patient had a PCN reaction that required hospitalization No Has patient had a PCN reaction occurring within the last 10 years:  No If all of the above answers are "NO", then may proceed with C  . Atorvastatin     myalgia  . Chantix [Varenicline]     Other reaction(s): headaches  . Dilaudid [Hydromorphone] Nausea And Vomiting  . Pravastatin     myalgia  . Rosuvastatin     myalgia  . Bystolic [Nebivolol Hcl] Other (See Comments)    Extreme fatigue, not able to focus  . Codeine Nausea And Vomiting  . Iodine Rash  . Metoprolol Other (See Comments)    Made patient very fatigued, could not focus    Review of Systems  Fever No drainage Good nutritional intake with protein supplement and zinc supplement  BP 123/80 (BP Location: Left Arm, Patient Position: Sitting)   Pulse 88   Resp 20  Ht 5\' 8"  (1.727 m)   SpO2 95% Comment: RA with mask on  BMI 22.81 kg/m  Physical Exam Comfortable Regular rhythm Lungs clear Symptoms stable Lower sternal opening improving and dressing change performed as noted above  Diagnostic Tests:  Culture performed yesterday still pending a report Impression: Superficial wound separation with cellulitis, improving with dressing changes for closure by secondary intent, daily dressing changes  Plan: Patient's wife will change dressing tomorrow he will return in 48 hours for dressing change at the clinic   Len Childs, MD [facility] [phone]

## 2020-05-30 ENCOUNTER — Ambulatory Visit: Payer: BC Managed Care – PPO | Admitting: Cardiothoracic Surgery

## 2020-05-31 ENCOUNTER — Encounter: Payer: Self-pay | Admitting: Cardiothoracic Surgery

## 2020-05-31 ENCOUNTER — Other Ambulatory Visit: Payer: Self-pay

## 2020-05-31 ENCOUNTER — Ambulatory Visit (INDEPENDENT_AMBULATORY_CARE_PROVIDER_SITE_OTHER): Payer: Self-pay | Admitting: Cardiothoracic Surgery

## 2020-05-31 VITALS — BP 107/69 | HR 72 | Resp 20 | Ht 68.0 in | Wt 150.0 lb

## 2020-05-31 DIAGNOSIS — Z951 Presence of aortocoronary bypass graft: Secondary | ICD-10-CM

## 2020-05-31 DIAGNOSIS — T8131XA Disruption of external operation (surgical) wound, not elsewhere classified, initial encounter: Secondary | ICD-10-CM

## 2020-05-31 LAB — WOUND CULTURE
MICRO NUMBER:: 11400709
RESULT:: NO GROWTH
SPECIMEN QUALITY:: ADEQUATE

## 2020-05-31 NOTE — Progress Notes (Signed)
PCP is Antony Contras, MD Referring Provider is Antony Contras, MD  Chief Complaint  Patient presents with  . Routine Post Op    Wound check/ HX of CABG    HPI: The patient returns for a wound check.  He is 1 month after multivessel CABG with a superficial lower sternal wound separation with cellulitis.  Wound cultures have been negative.  He has completed a course of empiric oral Keflex.  He is changing the dressing with 2 x 2 gauze saline wet-to-dry daily.  Today I performed a dressing change personally.  The wound was inspected and is 95% granulation tissue.  There is no tunneling.  There is no exposed sternum but portion of a sternal wire is exposed.  Because the wound was so cleaned today applied a small amount of activated human collagen covered with a 2 x 2 wet-to-dry gauze and sterile dressing.  The wife will continue wet-to-dry dressings daily at home.  The patient will return for weekly office checks beginning next week on Tuesday, January 18.  Left lower lobe 1.5 cm nodule was noted as an incidental finding on the patient's preoperative CT scan.  This was not accessible at time of sternotomy.  The patient will return for a follow-up CT scan in 6 months to evaluate the nodule.  The patient has a remote history of smoking.   Past Medical History:  Diagnosis Date  . Allergy   . Arthritis   . GERD (gastroesophageal reflux disease)    occ  . Heart murmur    baby  . High cholesterol   . Hypertension   . Pneumonia   . Right carotid bruit 06/10/2017  . Sciatica   . Seasonal allergies   . Sinus tachycardia   . Spinal stenosis     Past Surgical History:  Procedure Laterality Date  . CARPAL TUNNEL WITH CUBITAL TUNNEL Left 11/10/2013   Procedure: LEFT CARPAL TUNNEL RELEASE;  Surgeon: Cammie Sickle, MD;  Location: [facility];  Service: Orthopedics;  Laterality: Left;  . COLONOSCOPY    . CORONARY ARTERY BYPASS GRAFT N/A 05/01/2020   Procedure: CORONARY ARTERY  BYPASS GRAFTING (CABG) X 2 ON CARDIOPULMONARY BYPASS. LIMA TO LAD, SVG TO DIAG;  Surgeon: Ivin Poot, MD;  Location: [facility];  Service: Open Heart Surgery;  Laterality: N/A;  . ENDOVEIN HARVEST OF GREATER SAPHENOUS VEIN Right 05/01/2020   Procedure: ENDOVEIN HARVEST OF GREATER SAPHENOUS VEIN;  Surgeon: Ivin Poot, MD;  Location: [facility];  Service: Open Heart Surgery;  Laterality: Right;  . KNEE ARTHROSCOPY  IP:[id number]   left and right  . LUMBAR LAMINECTOMY/DECOMPRESSION MICRODISCECTOMY Left 09/29/2017   Procedure: LEFT LUMBAR TWO - LUMBAR THREELAMINOTOMY/MICRODISCECTOMY;  Surgeon: Jovita Gamma, MD;  Location: [facility];  Service: Neurosurgery;  Laterality: Left;  LEFT LUMBAR 2- LUMBAR 3 LAMINOTOMY/MICRODISCECTOMY  . NASAL SINUS SURGERY  05/2015  . RIGHT/LEFT HEART CATH AND CORONARY ANGIOGRAPHY N/A 04/10/2020   Procedure: RIGHT/LEFT HEART CATH AND CORONARY ANGIOGRAPHY;  Surgeon: Troy Sine, MD;  Location: [facility];  Service: Cardiovascular;  Laterality: N/A;  . TEE WITHOUT CARDIOVERSION N/A 05/01/2020   Procedure: TRANSESOPHAGEAL ECHOCARDIOGRAM (TEE);  Surgeon: Prescott Gum, Collier Salina, MD;  Location: [facility];  Service: Open Heart Surgery;  Laterality: N/A;  . TOTAL KNEE ARTHROPLASTY Right 06/24/2016   Procedure: TOTAL KNEE ARTHROPLASTY;  Surgeon: Garald Balding, MD;  Location: [facility];  Service: Orthopedics;  Laterality: Right;  . TRIGGER FINGER RELEASE Left 11/10/2013  Procedure: LEFT INDEX A-1 PULLEY RELEASE;  Surgeon: Cammie Sickle, MD;  Location: [facility];  Service: Orthopedics;  Laterality: Left;  . ULNAR NERVE TRANSPOSITION Left 11/10/2013   Procedure: ULNAR NERVE TRANSPOSITION;  Surgeon: Cammie Sickle, MD;  Location: [facility];  Service: Orthopedics;  Laterality: Left;    History reviewed. No pertinent family history.  Social History Social History   Tobacco Use  . Smoking status: Former Smoker    Packs/day: 1.00    Years: 37.00     Pack years: 37.00    Types: Cigarettes    Quit date: 05/04/2016    Years since quitting: 4.0  . Smokeless tobacco: Never Used  Vaping Use  . Vaping Use: Former  Substance Use Topics  . Alcohol use: Yes    Alcohol/week: 3.0 standard drinks    Types: 3 Glasses of wine per week    Comment: weekends only with dinner  . Drug use: No    Current Outpatient Medications  Medication Sig Dispense Refill  . ALPRAZolam (XANAX) 0.5 MG tablet Take 1 tablet (0.5 mg total) by mouth 2 (two) times daily as needed for up to 10 days for anxiety. 20 tablet 0  . aspirin EC 325 MG EC tablet Take 1 tablet (325 mg total) by mouth daily.    . cephALEXin (KEFLEX) 500 MG capsule Take 1 capsule (500 mg total) by mouth 3 (three) times daily for 7 days. 21 capsule 0  . diclofenac Sodium (VOLTAREN) 1 % GEL Apply 2 g topically daily as needed (Knee and back pain).    Marland Kitchen diltiazem (CARDIZEM CD) 240 MG 24 hr capsule Take 1 capsule (240 mg total) by mouth daily. 90 capsule 3  . diphenhydrAMINE (BENADRYL) 25 MG tablet Take 12.5 mg by mouth at bedtime.    . ferrous [id number] C-folic acid (TRINSICON / FOLTRIN) capsule Take 1 capsule by mouth 2 (two) times daily after a meal. 60 capsule 1  . fluticasone (FLONASE) 50 MCG/ACT nasal spray Place 2 sprays into both nostrils daily as needed for allergies.     . Multiple Vitamin (MULTIVITAMIN WITH MINERALS) TABS tablet Take 1 tablet by mouth daily.    Marland Kitchen oxyCODONE (OXY IR/ROXICODONE) 5 MG immediate release tablet Take 1 tablet (5 mg total) by mouth every 4 (four) hours as needed for severe pain. 20 tablet 0  . tetrahydrozoline 0.05 % ophthalmic solution Place 1 drop into both eyes 3 (three) times daily as needed (for dry/irritated eyes.).     No current facility-administered medications for this visit.    Allergies  Allergen Reactions  . Mushroom Extract Complex Anaphylaxis  . Penicillins Shortness Of Breath    Childhood allergy.  Has patient had a PCN reaction  causing immediate rash, facial/tongue/throat swelling, SOB or lightheadedness with hypotension: No Has patient had a PCN reaction causing severe rash involving mucus membranes or skin necrosis: No Has patient had a PCN reaction that required hospitalization No Has patient had a PCN reaction occurring within the last 10 years: No If all of the above answers are "NO", then may proceed with C  . Atorvastatin     myalgia  . Chantix [Varenicline]     Other reaction(s): headaches  . Dilaudid [Hydromorphone] Nausea And Vomiting  . Pravastatin     myalgia  . Rosuvastatin     myalgia  . Bystolic [Nebivolol Hcl] Other (See Comments)    Extreme fatigue, not able to focus  . Codeine Nausea And  Vomiting  . Iodine Rash  . Metoprolol Other (See Comments)    Made patient very fatigued, could not focus    Review of Systems   Compliant with meds Drinking protein supplements and taking his zinc tablet to help with wound healing No symptoms of angina  BP 107/69   Pulse 72   Resp 20   Ht 5\' 8"  (1.727 m)   Wt 150 lb (68 kg)   SpO2 97% Comment: RA  BMI 22.81 kg/m  Physical Exam Lower extremity wound separation 2 cm x 1 cm with 95% granulation tissue clean. Regular rhythm Lungs clear Remainder of sternal incision well-healed No edema  Diagnostic Tests: Wound culture negative  Impression: Superficial sternal wound healing by secondary intent. The patient will finish his course of oral antibiotics and then discontinue since the wound cultures are negative The patient will have his wife change the dressing daily at home and return for weekly clinic visits. He will be referred for outpatient cardiac rehab at [facility]: Return Tuesday, January 18 for wound check and dressing change.   Len Childs, MD [facility] [phone]

## 2020-06-05 ENCOUNTER — Ambulatory Visit: Payer: Self-pay | Admitting: Physician Assistant

## 2020-06-05 ENCOUNTER — Other Ambulatory Visit: Payer: Self-pay

## 2020-06-05 VITALS — BP 121/80 | HR 100 | Temp 97.7°F | Resp 20 | Ht 68.0 in | Wt 150.0 lb

## 2020-06-05 DIAGNOSIS — T8131XA Disruption of external operation (surgical) wound, not elsewhere classified, initial encounter: Secondary | ICD-10-CM

## 2020-06-05 DIAGNOSIS — Z951 Presence of aortocoronary bypass graft: Secondary | ICD-10-CM

## 2020-06-05 MED ORDER — ALPRAZOLAM 0.5 MG PO TABS: 0.5000 mg | ORAL_TABLET | Freq: Every day | ORAL | 0 refills | Status: DC | PRN

## 2020-06-05 NOTE — Patient Instructions (Signed)
Continue with daily wet to dry dressing changes, as instructed previously

## 2020-06-05 NOTE — Progress Notes (Addendum)
  HPI:  Patient returns for follow up of lower sternal wound. Patient has undergone a CABG x 2 by Dr. Prescott Gum on 05/01/2020. He has recovered well from heart surgery;however, his lower sternal wound dehisced and had cellulitis. He was treated with Keflex and daily wet to dry dressing changes were done. Dr. Prescott Gum last saw the patient on 05/31/2020. At that time, there was a fair amount of granulation, no tunneling and no exposed sternum but a portion of the wire was seen. Cellerate was placed into wound followed by wet to dry dressing. Patient's wife has been doing daily dressing changes.  Current Outpatient Medications  Medication Sig Dispense Refill  . aspirin EC 325 MG EC tablet Take 1 tablet (325 mg total) by mouth daily.    . diclofenac Sodium (VOLTAREN) 1 % GEL Apply 2 g topically daily as needed (Knee and back pain).    Marland Kitchen diltiazem (CARDIZEM CD) 240 MG 24 hr capsule Take 1 capsule (240 mg total) by mouth daily. 90 capsule 3  . diphenhydrAMINE (BENADRYL) 25 MG tablet Take 12.5 mg by mouth at bedtime.    . ferrous [id number] C-folic acid (TRINSICON / FOLTRIN) capsule Take 1 capsule by mouth 2 (two) times daily after a meal. 60 capsule 1  . fluticasone (FLONASE) 50 MCG/ACT nasal spray Place 2 sprays into both nostrils daily as needed for allergies.     . Multiple Vitamin (MULTIVITAMIN WITH MINERALS) TABS tablet Take 1 tablet by mouth daily.    Marland Kitchen oxyCODONE (OXY IR/ROXICODONE) 5 MG immediate release tablet Take 1 tablet (5 mg total) by mouth every 4 (four) hours as needed for severe pain. 20 tablet 0  . tetrahydrozoline 0.05 % ophthalmic solution Place 1 drop into both eyes 3 (three) times daily as needed (for dry/irritated eyes.).    Vital Signs: Temp 97.7, RR 20, HR 100, BP 121/80   Physical Exam: Sternal wound-Lower portion of sternal wound is open. There is a fair amount of granulation and the wound is clean. There is no cellulitis.   Impression and Plan: Betadine was  used to clean around the lower sternal wound. Q tip was used to place Cellerate into wound. Wet to dry (using normal saline) dressing packed into open wound. Dry 4x4s, tape, and clear dressing (water proof) was placed over wound. He will return next week for continued wound surveillance. Patient did request a refill for Xanax. He takes a half of a pill prior to dressing changes because he gets very anxious. I discussed with him that I will give him 10 more pills (with no refill). We talked about the addiction potential and he is aware that he will receive no more refills. Of note, patient will need a CT scan in 6 months to follow up previously seen 15x11 mm superior segment LLL nodule.    Nani Skillern, PA-C [facility] [phone]

## 2020-06-11 ENCOUNTER — Ambulatory Visit (INDEPENDENT_AMBULATORY_CARE_PROVIDER_SITE_OTHER): Payer: Self-pay | Admitting: Surgical

## 2020-06-11 ENCOUNTER — Other Ambulatory Visit: Payer: Self-pay

## 2020-06-11 DIAGNOSIS — Z5189 Encounter for other specified aftercare: Secondary | ICD-10-CM | POA: Insufficient documentation

## 2020-06-11 NOTE — Progress Notes (Signed)
[address]       [place] [address]             [phone]           [address]       [place] [address]             [phone]      Sailor P Arreola Storey Medical Record #[id number] Date of Birth: March 07, 1961  Referring: Jerline Pain, MD Primary Care: Antony Contras, MD Primary Cardiologist: Candee Furbish, MD   Chief Complaint:   POST OP FOLLOW UP  OPERATIVE REPORT  DATE OF PROCEDURE:  05/01/2020  OPERATION: 1.  Coronary artery bypass grafting x2 (left internal mammary artery to left anterior descending, saphenous vein graft to diagonal). 2.  Endoscopic harvest of right leg greater saphenous vein.  SURGEON:  Ivin Poot, MD  ASSISTANT:  Lars Pinks, PA-C  ANESTHESIA:  General by Dr. Lillia Abed.  PREOPERATIVE DIAGNOSES:  Severe 2-vessel coronary artery disease with class III progressive angina.  POSTOPERATIVE DIAGNOSES:  Severe 2-vessel coronary artery disease with class III progressive angina.    History of Present Illness:    The patient is a 60 year old male status post the above described procedure.  He is seen in the office on today's date and routine postsurgical follow-up and in particular for a minor sternal wound infection.  He has been getting sterile wet-to-dry normal saline packings and is feeling as though the wound is showing steady improvement.  He is not having any fevers, chills or other significant constitutional symptoms.  He does have some generalized soreness daily but he does have a history of multiple musculoskeletal issues.      Past Medical History:  Diagnosis Date  . Allergy   . Arthritis   . GERD (gastroesophageal reflux disease)    occ  . Heart murmur    baby  . High cholesterol   . Hypertension   . Pneumonia   . Right carotid bruit 06/10/2017  . Sciatica   . Seasonal allergies   . Sinus tachycardia   . Spinal stenosis      Social History   Tobacco Use   Smoking Status Former Smoker  . Packs/day: 1.00  . Years: 37.00  . Pack years: 37.00  . Types: Cigarettes  . Quit date: 05/04/2016  . Years since quitting: 4.1  Smokeless Tobacco Never Used    Social History   Substance and Sexual Activity  Alcohol Use Yes  . Alcohol/week: 3.0 standard drinks  . Types: 3 Glasses of wine per week   Comment: weekends only with dinner     Allergies  Allergen Reactions  . Mushroom Extract Complex Anaphylaxis  . Penicillins Shortness Of Breath    Childhood allergy.  Has patient had a PCN reaction causing immediate rash, facial/tongue/throat swelling, SOB or lightheadedness with hypotension: No Has patient had a PCN reaction causing severe rash involving mucus membranes or skin necrosis: No Has patient had a PCN reaction that required hospitalization No Has patient had a PCN reaction occurring within the last 10 years: No If all of the above answers are "NO", then may proceed with C  . Atorvastatin     myalgia  . Chantix [Varenicline]     Other reaction(s): headaches  . Dilaudid [Hydromorphone] Nausea And Vomiting  . Pravastatin     myalgia  . Rosuvastatin     myalgia  . Bystolic [Nebivolol Hcl] Other (  See Comments)    Extreme fatigue, not able to focus  . Codeine Nausea And Vomiting  . Iodine Rash  . Metoprolol Other (See Comments)    Made patient very fatigued, could not focus    Current Outpatient Medications  Medication Sig Dispense Refill  . ALPRAZolam (XANAX) 0.5 MG tablet Take 1 tablet (0.5 mg total) by mouth daily as needed for anxiety. 10 tablet 0  . aspirin EC 325 MG EC tablet Take 1 tablet (325 mg total) by mouth daily.    . diclofenac Sodium (VOLTAREN) 1 % GEL Apply 2 g topically daily as needed (Knee and back pain).    Marland Kitchen diltiazem (CARDIZEM CD) 240 MG 24 hr capsule Take 1 capsule (240 mg total) by mouth daily. 90 capsule 3  . diphenhydrAMINE (BENADRYL) 25 MG tablet Take 12.5 mg by mouth at bedtime.    . ferrous  [id number] C-folic acid (TRINSICON / FOLTRIN) capsule Take 1 capsule by mouth 2 (two) times daily after a meal. 60 capsule 1  . fluticasone (FLONASE) 50 MCG/ACT nasal spray Place 2 sprays into both nostrils daily as needed for allergies.     . Multiple Vitamin (MULTIVITAMIN WITH MINERALS) TABS tablet Take 1 tablet by mouth daily.    Marland Kitchen oxyCODONE (OXY IR/ROXICODONE) 5 MG immediate release tablet Take 1 tablet (5 mg total) by mouth every 4 (four) hours as needed for severe pain. 20 tablet 0  . tetrahydrozoline 0.05 % ophthalmic solution Place 1 drop into both eyes 3 (three) times daily as needed (for dry/irritated eyes.).     No current facility-administered medications for this visit.       Physical Exam: BP (!) 145/85 (BP Location: Right Arm, Patient Position: Sitting, Cuff Size: Normal)   Pulse (!) 102   Resp 18   Wt 150 lb 9.6 oz (68.3 kg)   SpO2 97% Comment: RA  BMI 22.90 kg/m   General appearance: alert, cooperative and no distress Wound: The wound has an excellent granulation base with no evidence of surrounding cellulitis or purulence.  The sternum itself is stable without clicking/popping.   Diagnostic Studies & Laboratory data:     Recent Radiology Findings:   No results found.    Recent Lab Findings: Lab Results  Component Value Date   WBC 9.5 05/06/2020   HGB 11.9 (L) 05/06/2020   HCT 34.5 (L) 05/06/2020   PLT 241 05/06/2020   GLUCOSE 104 (H) 05/07/2020   ALT 23 04/30/2020   AST 27 04/30/2020   NA 126 (L) 05/07/2020   K 3.8 05/07/2020   CL 87 (L) 05/07/2020   CREATININE 0.67 05/07/2020   BUN 12 05/07/2020   CO2 28 05/07/2020   INR 1.2 05/03/2020   HGBA1C 5.8 (H) 04/30/2020   Recent Results (from the past 720 hour(s))  Wound culture     Status: None   Collection Time: 05/28/20 11:20 AM   Specimen: Chest; Wound  Result Value Ref Range Status   MICRO NUMBER: [id number]  Final   SPECIMEN QUALITY: Adequate  Final   SOURCE: STERNAL WOUND  Final    STATUS: FINAL  Final   GRAM STAIN:   Final    No organisms or white blood cells seen No epithelial cells seen   RESULT: No Growth  Final     Assessment / Plan:        Medication Changes: No orders of the defined types were placed in this encounter. Steady improvement in small sternal wound infection with excellent granulation  base.  He will continue daily wet-to-dry normal saline dressing was and we will see him again in 3 to 4 weeks.  We can see him as needed earlier should there be any significant changes and he is aware of what to look for.    John Giovanni, PA-C 06/11/2020 2:23 PM

## 2020-06-22 ENCOUNTER — Other Ambulatory Visit: Payer: Self-pay

## 2020-06-22 ENCOUNTER — Encounter: Payer: Self-pay | Admitting: Thoracic Surgery (Cardiothoracic Vascular Surgery)

## 2020-06-22 ENCOUNTER — Ambulatory Visit (INDEPENDENT_AMBULATORY_CARE_PROVIDER_SITE_OTHER): Payer: Self-pay | Admitting: Thoracic Surgery (Cardiothoracic Vascular Surgery)

## 2020-06-22 VITALS — BP 156/95 | HR 111 | Temp 98.2°F | Resp 18 | Ht 68.0 in | Wt 153.8 lb

## 2020-06-22 DIAGNOSIS — Z5189 Encounter for other specified aftercare: Secondary | ICD-10-CM

## 2020-06-22 NOTE — Progress Notes (Signed)
      [address]       [place] [address]             [id number]        Philip Winters  Medical Record #[id number] Date of Birth: April 10, 1961  Referring: Jerline Pain, MD Primary Care: Antony Contras, MD Primary Cardiologist:Mark Marlou Porch, MD  Reason for visit:   follow-up  History of Present Illness:     Philip Winters is a patient of Dr. Darcey Nora.  He underwent coronary artery bypass grafting in December of last year.  He subsequently developed a small wound that required wet-to-dry dressing changes.  He comes in today for wound check.  He also complains of left upper extremity pain and weakness that has been present since surgery.  He thinks that there may be some muscle wasting as well.  Physical Exam: BP (!) 156/95 (BP Location: Left Arm, Patient Position: Sitting, Cuff Size: Normal)   Pulse (!) 111   Temp 98.2 F (36.8 C) (Skin)   Resp 18   Ht 5\' 8"  (1.727 m)   Wt 153 lb 12.8 oz (69.8 kg)   SpO2 97% Comment: RA with mask on  BMI 23.39 kg/m   Alert NAD Incision is well-healed.  There is a small 1 x 3 cm area in the lower portion of the wound that has good granulation tissue.  It is completely filled.  Silver nitrate was applied.  Abdomen soft, ND No peripheral edema       Assessment / Plan:   60 year old male status post coronary artery bypass grafting, and small superficial sternal wound infection.  Sternal wound is well-healed.  He also complains of some left upper extremity weakness.  He does have a significant orthopedic history including a discectomy in his lumbar spine.  I wonder if there may be some component of spinal stenosis in his cervical and thoracic spine as well.  He already follows with the neurosurgeons here in [place] and I recommended that he speak with them about his potential spinal stenosis.  The other possible issue is that the weakness is postsurgical, and he will require physical therapy when he goes to cardiac rehab to  assist with regaining more strength in his left arm.  I have given him another referral to cardiac rehab since he has not yet had his orientation.  He will follow-up with me as needed.   Lajuana Matte 06/22/2020 3:45 PM

## 2020-06-23 ENCOUNTER — Other Ambulatory Visit: Payer: Self-pay | Admitting: Physician Assistant

## 2020-06-26 ENCOUNTER — Other Ambulatory Visit: Payer: Self-pay | Admitting: Thoracic Surgery (Cardiothoracic Vascular Surgery)

## 2020-06-26 DIAGNOSIS — R911 Solitary pulmonary nodule: Secondary | ICD-10-CM

## 2020-06-28 ENCOUNTER — Telehealth (HOSPITAL_COMMUNITY): Payer: Self-pay | Admitting: Family Medicine

## 2020-06-28 DIAGNOSIS — R7303 Prediabetes: Secondary | ICD-10-CM | POA: Diagnosis not present

## 2020-06-28 DIAGNOSIS — R5381 Other malaise: Secondary | ICD-10-CM | POA: Diagnosis not present

## 2020-06-28 DIAGNOSIS — R5383 Other fatigue: Secondary | ICD-10-CM | POA: Diagnosis not present

## 2020-06-28 DIAGNOSIS — D649 Anemia, unspecified: Secondary | ICD-10-CM | POA: Diagnosis not present

## 2020-06-28 DIAGNOSIS — E782 Mixed hyperlipidemia: Secondary | ICD-10-CM | POA: Diagnosis not present

## 2020-06-29 ENCOUNTER — Telehealth (HOSPITAL_COMMUNITY): Payer: Self-pay | Admitting: Family Medicine

## 2020-06-29 ENCOUNTER — Telehealth (HOSPITAL_COMMUNITY): Payer: Self-pay

## 2020-06-29 DIAGNOSIS — M5136 Other intervertebral disc degeneration, lumbar region: Secondary | ICD-10-CM | POA: Diagnosis not present

## 2020-06-29 DIAGNOSIS — M4726 Other spondylosis with radiculopathy, lumbar region: Secondary | ICD-10-CM | POA: Diagnosis not present

## 2020-06-29 DIAGNOSIS — M5416 Radiculopathy, lumbar region: Secondary | ICD-10-CM | POA: Diagnosis not present

## 2020-06-29 DIAGNOSIS — Z9889 Other specified postprocedural states: Secondary | ICD-10-CM | POA: Diagnosis not present

## 2020-06-29 DIAGNOSIS — M5412 Radiculopathy, cervical region: Secondary | ICD-10-CM | POA: Diagnosis not present

## 2020-06-29 NOTE — Telephone Encounter (Signed)
Pt insurance is active and benefits verified through [place]. Co-pay $0.00, DED $2,000.00/$2,000.00 met, out of pocket $6,000.00/$6,000.00 met, co-insurance 20%. No pre-authorization required. Passport, 06/29/20 @ 2:46PM, GPC#[id number]  Will contact patient to see if he is interested in the Cardiac Rehab Program.

## 2020-06-29 NOTE — Telephone Encounter (Signed)
Called patient to see if he was interested in participating in the Cardiac Rehab Program. Patient stated yes. Patient will come in for orientation on 08/09/20 @ 1:15PM and will attend the 1:30PM exercise class. Went over insurance, patient verbalized understanding.   Tourist information centre manager.

## 2020-07-03 ENCOUNTER — Other Ambulatory Visit: Payer: BC Managed Care – PPO

## 2020-07-04 ENCOUNTER — Other Ambulatory Visit: Payer: Self-pay | Admitting: Neurosurgery

## 2020-07-04 DIAGNOSIS — M5412 Radiculopathy, cervical region: Secondary | ICD-10-CM

## 2020-07-08 ENCOUNTER — Ambulatory Visit
Admission: RE | Admit: 2020-07-08 | Discharge: 2020-07-08 | Disposition: A | Discharge status: Home / Self Care | Payer: BC Managed Care – PPO | Source: Ambulatory Visit | Attending: Neurosurgery | Admitting: Neurosurgery

## 2020-07-08 ENCOUNTER — Other Ambulatory Visit: Payer: Self-pay

## 2020-07-08 DIAGNOSIS — M5412 Radiculopathy, cervical region: Secondary | ICD-10-CM

## 2020-07-08 DIAGNOSIS — M4802 Spinal stenosis, cervical region: Secondary | ICD-10-CM | POA: Diagnosis not present

## 2020-07-08 IMAGING — MR MR CERVICAL SPINE W/O CM
4 of 5 series · 27 of 48 positions shown · non-contrast
Comparison: None.

CLINICAL DATA: Right-sided posterior neck pain

EXAM:
MRI CERVICAL SPINE WITHOUT CONTRAST
TECHNIQUE: Multiplanar, multisequence MR imaging of the cervical spine was
performed. No intravenous contrast was administered.

[Series 5: T2 · sagittal · 3.0mm · 0.55mm/px · 6 of 17 slices shown (1 of 2)]
[im 1/17]
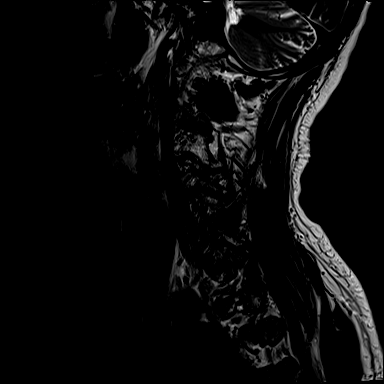
[im 4/17]
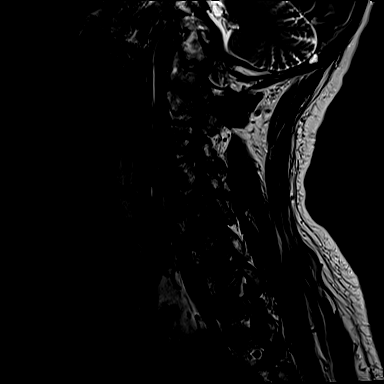
[im 7/17]
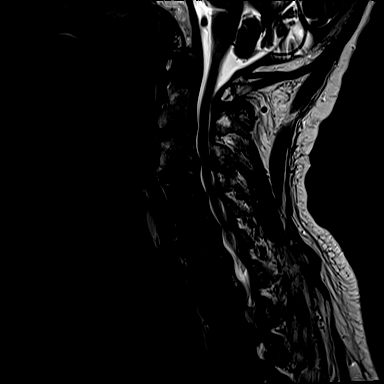
[im 10/17]
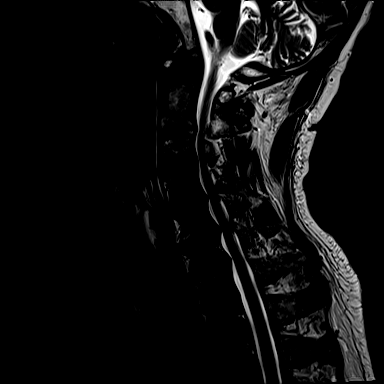
[im 13/17]
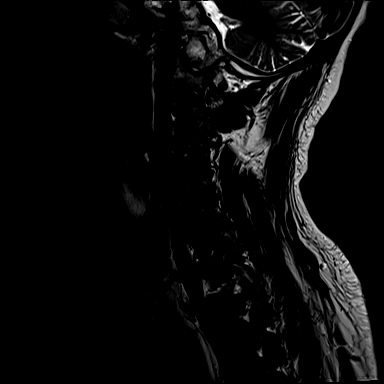
[im 17/17]
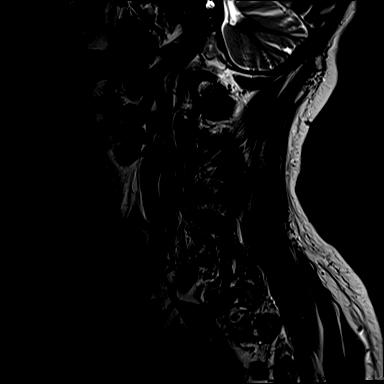

[Series 6: T1 · sagittal · 3.0mm · 0.66mm/px · 7 of 17 slices shown]
[im 1/17]
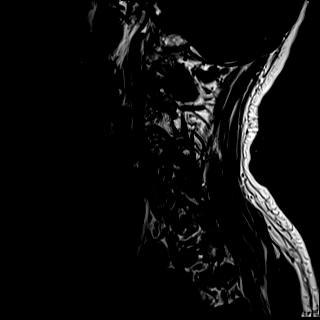
[im 3/17]
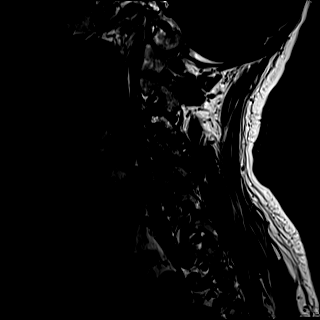
[im 6/17]
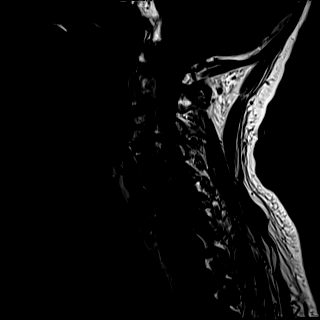
[im 9/17]
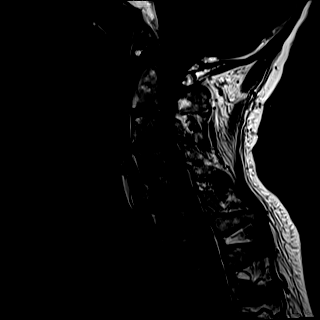
[im 11/17]
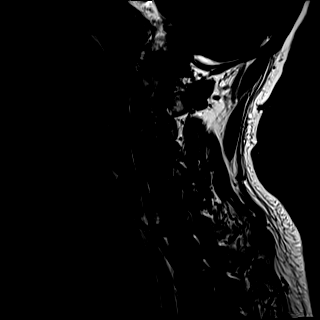
[im 14/17]
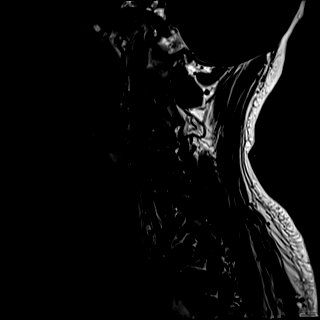
[im 17/17]
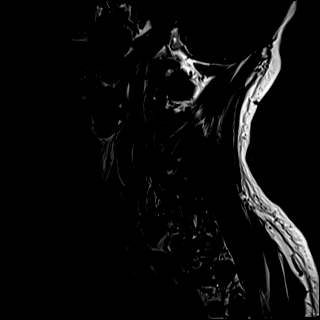

[Series 7: STIR · sagittal · 3.0mm · 0.33mm/px · 6 of 17 slices shown]
[im 1/17]
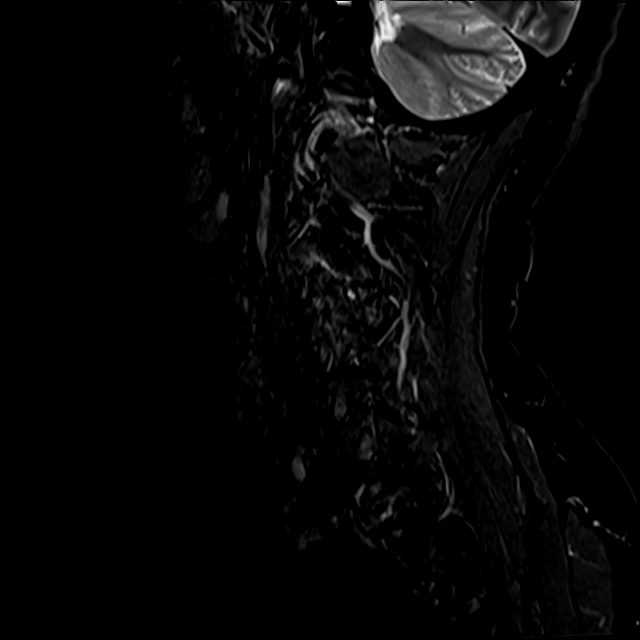
[im 3/17]
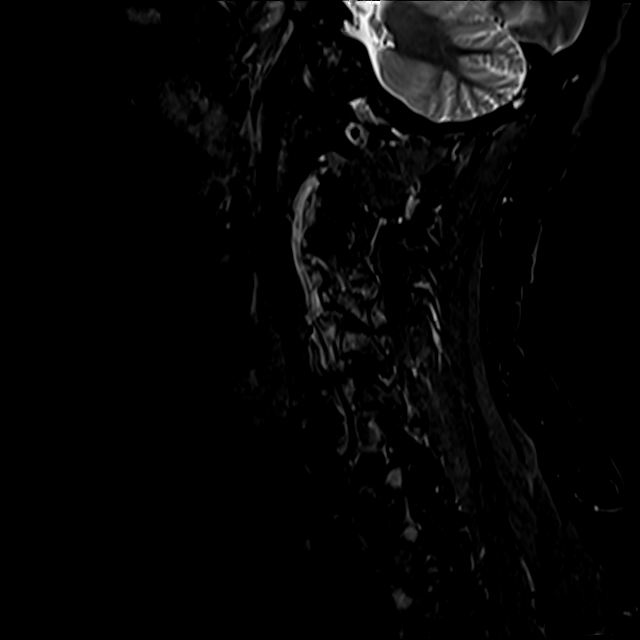
[im 6/17]
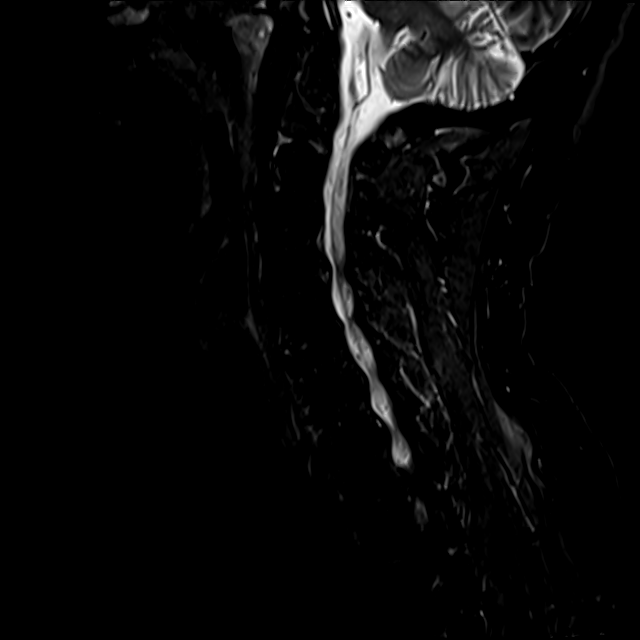
[im 9/17]
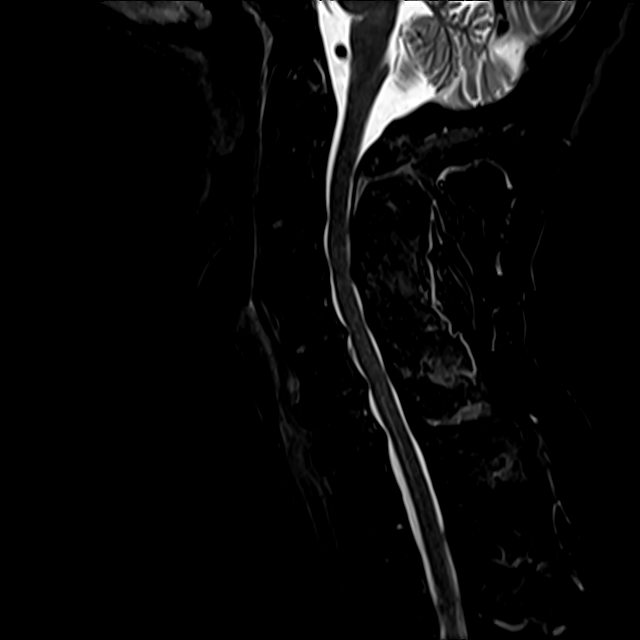
[im 11/17]
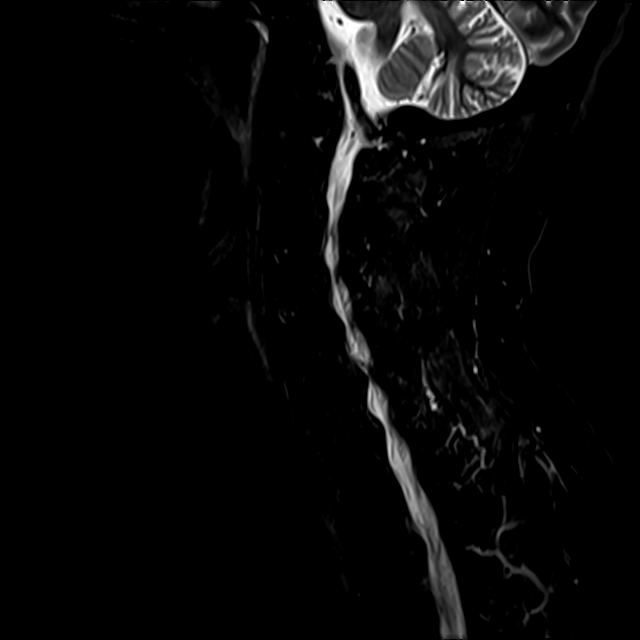
[im 14/17]
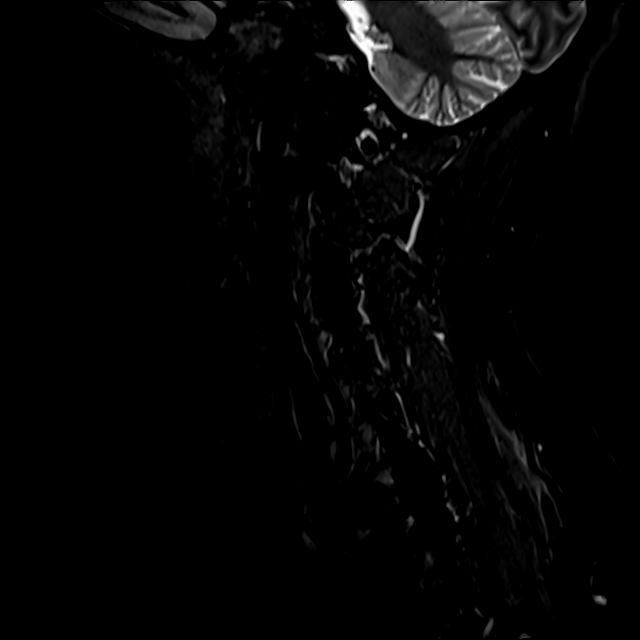

[Series 8: T2 · axial · 3.0mm · 0.50mm/px · z∈[-65,+41]mm · 8 of 36 slices shown (2 of 2)]
[im 1/36]
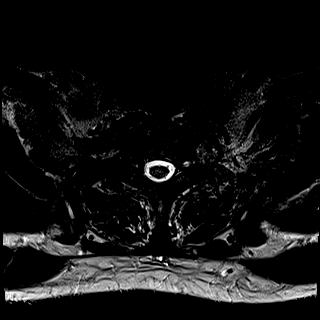
[im 6/36]
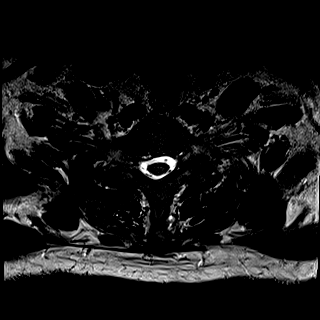
[im 11/36]
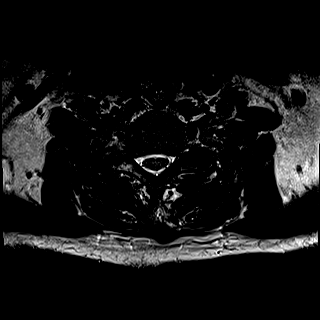
[im 17/36]
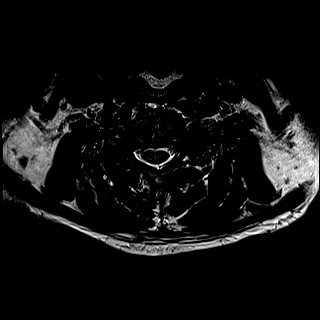
[im 19/36]
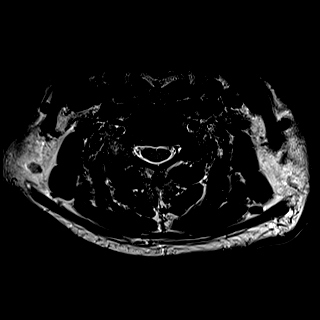
[im 25/36]
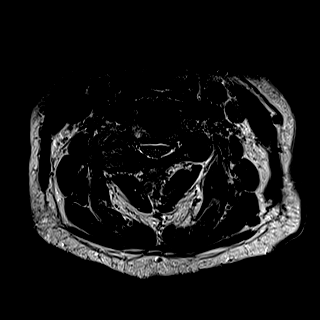
[im 30/36]
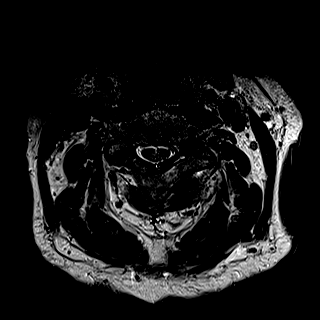
[im 36/36]
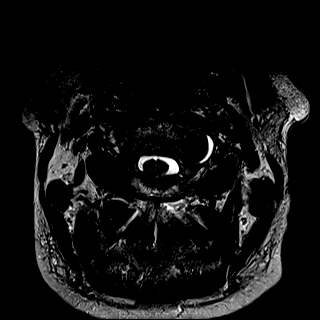

[27 of 48 positions shown; findings below may reference images not displayed]

FINDINGS: Alignment: Physiologic.

Vertebrae: No fracture, evidence of discitis, or bone lesion.

Cord: Normal signal and morphology.

Posterior Fossa, vertebral arteries, paraspinal tissues: Negative

Disc levels:

C2-3: Asymmetric left uncovertebral and facet spurring. Mild to
moderate left foraminal narrowing.

C3-4: Disc narrowing with uncovertebral ridging asymmetric to the
right. Facet osteoarthritis with bulky spurring on the right where
there may be ankylosis. Ligamentum flavum thickening. Spinal
stenosis with mild cord flattening. Advanced right and moderate left
foraminal stenosis.

C4-5: Disc narrowing and bulging with left paracentral protrusion.
Degenerative facet spurring on both sides. Spinal stenosis that
flattens the ventral cord, mild based on sagittal images. Right more
than left foraminal stenosis, advanced appearing on the right and
more moderate on the left.

C5-6: Disc narrowing and bulging. Ligamentum flavum thickening.
Spinal stenosis with cord flattening. Severe biforaminal impingement

C6-7: Disc narrowing and bulging with bilateral uncovertebral
spurring. Mild facet spurring. Advanced bilateral foraminal
stenosis. Mild spinal stenosis.

C7-T1:Right paracentral protrusion extending into the foramen and
causing C8 impingement on the right.
IMPRESSION: 1. Diffuse disc and facet degeneration as described.
2. Spinal stenosis with mild cord mass effect at C3-4 to C5-6.
3. Advanced foraminal impingement on the right at C3-4 to C7-T1.
4. Advanced foraminal impingement on the left at C5-6 and C6-7 with
additional moderate narrowings higher in the cervical spine.

## 2020-07-12 ENCOUNTER — Ambulatory Visit: Payer: BC Managed Care – PPO | Admitting: Orthopaedic Surgery

## 2020-07-12 ENCOUNTER — Ambulatory Visit: Payer: Self-pay

## 2020-07-12 ENCOUNTER — Encounter: Payer: Self-pay | Admitting: Orthopaedic Surgery

## 2020-07-12 ENCOUNTER — Telehealth: Payer: Self-pay | Admitting: Nurse Practitioner

## 2020-07-12 ENCOUNTER — Other Ambulatory Visit: Payer: Self-pay

## 2020-07-12 ENCOUNTER — Other Ambulatory Visit: Payer: BC Managed Care – PPO

## 2020-07-12 VITALS — Ht 68.0 in | Wt 153.0 lb

## 2020-07-12 DIAGNOSIS — Z789 Other specified health status: Secondary | ICD-10-CM | POA: Diagnosis not present

## 2020-07-12 DIAGNOSIS — M25572 Pain in left ankle and joints of left foot: Secondary | ICD-10-CM | POA: Diagnosis not present

## 2020-07-12 DIAGNOSIS — S93492A Sprain of other ligament of left ankle, initial encounter: Secondary | ICD-10-CM | POA: Diagnosis not present

## 2020-07-12 DIAGNOSIS — E782 Mixed hyperlipidemia: Secondary | ICD-10-CM | POA: Diagnosis not present

## 2020-07-12 DIAGNOSIS — S93402A Sprain of unspecified ligament of left ankle, initial encounter: Secondary | ICD-10-CM | POA: Insufficient documentation

## 2020-07-12 LAB — LIPID PANEL
Chol/HDL Ratio: 4.3 ratio (ref 0.0–5.0)
Cholesterol, Total: 165 mg/dL (ref 100–199)
HDL: 38 mg/dL — ABNORMAL LOW
LDL Chol Calc (NIH): 113 mg/dL — ABNORMAL HIGH (ref 0–99)
Triglycerides: 70 mg/dL (ref 0–149)
VLDL Cholesterol Cal: 14 mg/dL (ref 5–40)

## 2020-07-12 NOTE — Telephone Encounter (Signed)
Called patient in response to MyChart message that he is having bilateral upper extremity difficulty making a fist, hands stay white and swollen. The patient is currently at our office getting lab work in preparation for his appointment with Dr. Marlou Porch on 3/4. He states he has been having these symptoms for a few weeks. He has had follow-up with CT surgery and was advised to contact his spine doctor, which he states he has done. Has MRI scheduled. He states he has generalized discomfort in his upper body, his shoulders, and arms. Reports home BP readings 120/80 mmHg. States he is physically fit but this problem is impeding his ability to be active and he is unable to make a fist. He reports good pulses in his wrists bilaterally. He is frustrated that he has not started cardiac rehab. He denies hx of Raynaud syndrome. I advised him to continue to move upper extremities as much as possible and monitor pulses. I advised I will forward message to Dr. Marlou Porch for his awareness. Patient verbalized understanding and agreement with plan.

## 2020-07-12 NOTE — Progress Notes (Signed)
Office Visit Note   Patient: Philip Winters           Date of Birth: 03-Feb-1961           MRN: [id number] Visit Date: 07/12/2020              Requested by: Antony Contras, MD [place],  [place] [address] PCP: Antony Contras, MD   Assessment & Plan: Visit Diagnoses:  1. Pain in left ankle and joints of left foot   2. Sprain of anterior talofibular ligament of left ankle, initial encounter     Plan: Films were negative left ankle for any evidence of fracture.  There is soft tissue swelling.  Mostly pain along the anterior talofibular ligament consistent with a sprain.  Will apply ASO ankle support and reevaluate in the next month.  Has end-stage osteoarthritis of his left knee and at some point would be a candidate for knee replacement.  He is still in the recuperative period from a CABG.  Being evaluated by the neurosurgeons for issues with his back and his neck  Follow-Up Instructions: Return in about 1 month (around 08/09/2020).   Orders:  Orders Placed This Encounter  Procedures  . XR Ankle Complete Left   No orders of the defined types were placed in this encounter.     Procedures: No procedures performed   Clinical Data: No additional findings.   Subjective: Chief Complaint  Patient presents with  . Left Ankle - Injury, Pain  Patient presents today for left ankle pain. He said that last week he stepped down from the porch and twisted his ankle. He gave it a few days thinking it would improve, but has not. He has pain throughout his ankle and swelling. The swelling gets better with elevation, but returns upon putting it down. He takes Tramadol and Aleve for his back pain. He states that it does not seem to be helping with his ankle pain.   HPI  Review of Systems   Objective: Vital Signs: Ht 5\' 8"  (1.727 m)   Wt 153 lb (69.4 kg)   BMI 23.26 kg/m   Physical Exam Constitutional:      Appearance: He is well-developed and well-nourished.   HENT:     Mouth/Throat:     Mouth: Oropharynx is clear and moist.  Eyes:     Extraocular Movements: EOM normal.     Pupils: Pupils are equal, round, and reactive to light.  Pulmonary:     Effort: Pulmonary effort is normal.  Skin:    General: Skin is warm and dry.  Neurological:     Mental Status: He is alert and oriented to person, place, and time.  Psychiatric:        Mood and Affect: Mood and affect normal.        Behavior: Behavior normal.     Ortho Exam awake alert and oriented x3.  Comfortable sitting.  There is a small effusion of his left knee and he does lack about 7 to 8 degrees of full extension.  Flexes over 100 degrees without instability.  Does have some medial joint pain.  Left ankle with pitting edema.  Tenderness over the anterior talofibular fibulocalcaneal ligaments.  Minimal tenderness over the deltoid ligament but no instability.  Foot was warm. Specialty Comments:  No specialty comments available.  Imaging: XR Ankle Complete Left  Result Date: 07/12/2020 Films of the left ankle obtained in several projections.  Ankle mortise intact.  No evidence of fracture about either malleolar I.  Soft tissue swelling both medially and laterally.  There is calcification within the posterior tibial and and.  Dorsalis pedis arteries.    PMFS History: Patient Active Problem List   Diagnosis Date Noted  . Left ankle sprain 07/12/2020  . Visit for wound check 06/11/2020  . Change or removal of surgical wound dressing 05/24/2020  . Pulmonary nodule, left 05/23/2020  . Cellulitis 05/23/2020  . S/P CABG x 2 05/02/2020  . Coronary artery disease 05/01/2020  . Pulmonary nodule 1 cm or greater in diameter 04/26/2020  . Statin intolerance 04/20/2020  . Coronary atherosclerosis of native coronary artery 04/18/2020  . Abnormal cardiovascular stress test   . Nonrheumatic aortic valve stenosis   . Exertional dyspnea   . Stable angina pectoris ([place])   . Bicuspid aortic valve  04/06/2020  . Primary osteoarthritis of left knee 11/08/2019  . Bilateral primary osteoarthritis of knee 09/28/2019  . HNP (herniated nucleus pulposus), lumbar 09/29/2017  . Right carotid bruit 06/10/2017  . Paresthesia 06/10/2017  . Left wrist pain 10/09/2016  . Unilateral primary osteoarthritis, right knee 06/24/2016  . S/P total knee replacement using cement, right 06/24/2016  . Acute pain of left knee 03/27/2016  . Mixed hyperlipidemia 01/18/2007  . HYPERTENSION 01/18/2007   Past Medical History:  Diagnosis Date  . Allergy   . Arthritis   . GERD (gastroesophageal reflux disease)    occ  . Heart murmur    baby  . High cholesterol   . Hypertension   . Pneumonia   . Right carotid bruit 06/10/2017  . Sciatica   . Seasonal allergies   . Sinus tachycardia   . Spinal stenosis     History reviewed. No pertinent family history.  Past Surgical History:  Procedure Laterality Date  . CARPAL TUNNEL WITH CUBITAL TUNNEL Left 11/10/2013   Procedure: LEFT CARPAL TUNNEL RELEASE;  Surgeon: Cammie Sickle, MD;  Location: [facility];  Service: Orthopedics;  Laterality: Left;  . COLONOSCOPY    . CORONARY ARTERY BYPASS GRAFT N/A 05/01/2020   Procedure: CORONARY ARTERY BYPASS GRAFTING (CABG) X 2 ON CARDIOPULMONARY BYPASS. LIMA TO LAD, SVG TO DIAG;  Surgeon: Ivin Poot, MD;  Location: [facility];  Service: Open Heart Surgery;  Laterality: N/A;  . ENDOVEIN HARVEST OF GREATER SAPHENOUS VEIN Right 05/01/2020   Procedure: ENDOVEIN HARVEST OF GREATER SAPHENOUS VEIN;  Surgeon: Ivin Poot, MD;  Location: [facility];  Service: Open Heart Surgery;  Laterality: Right;  . KNEE ARTHROSCOPY  [id number]   left and right  . LUMBAR LAMINECTOMY/DECOMPRESSION MICRODISCECTOMY Left 09/29/2017   Procedure: LEFT LUMBAR TWO - LUMBAR THREELAMINOTOMY/MICRODISCECTOMY;  Surgeon: Jovita Gamma, MD;  Location: [facility];  Service: Neurosurgery;  Laterality: Left;  LEFT LUMBAR 2- LUMBAR 3  LAMINOTOMY/MICRODISCECTOMY  . NASAL SINUS SURGERY  05/2015  . RIGHT/LEFT HEART CATH AND CORONARY ANGIOGRAPHY N/A 04/10/2020   Procedure: RIGHT/LEFT HEART CATH AND CORONARY ANGIOGRAPHY;  Surgeon: Troy Sine, MD;  Location: [facility];  Service: Cardiovascular;  Laterality: N/A;  . TEE WITHOUT CARDIOVERSION N/A 05/01/2020   Procedure: TRANSESOPHAGEAL ECHOCARDIOGRAM (TEE);  Surgeon: Prescott Gum, Collier Salina, MD;  Location: [facility];  Service: Open Heart Surgery;  Laterality: N/A;  . TOTAL KNEE ARTHROPLASTY Right 06/24/2016   Procedure: TOTAL KNEE ARTHROPLASTY;  Surgeon: Garald Balding, MD;  Location: [facility];  Service: Orthopedics;  Laterality: Right;  . TRIGGER FINGER RELEASE Left 11/10/2013   Procedure: LEFT INDEX  A-1 PULLEY RELEASE;  Surgeon: Cammie Sickle, MD;  Location: [facility];  Service: Orthopedics;  Laterality: Left;  . ULNAR NERVE TRANSPOSITION Left 11/10/2013   Procedure: ULNAR NERVE TRANSPOSITION;  Surgeon: Cammie Sickle, MD;  Location: [facility];  Service: Orthopedics;  Laterality: Left;   Social History   Occupational History  . Occupation: Self employed  Tobacco Use  . Smoking status: Former Smoker    Packs/day: 1.00    Years: 37.00    Pack years: 37.00    Types: Cigarettes    Quit date: 05/04/2016    Years since quitting: 4.1  . Smokeless tobacco: Never Used  Vaping Use  . Vaping Use: Former  Substance and Sexual Activity  . Alcohol use: Yes    Alcohol/week: 3.0 standard drinks    Types: 3 Glasses of wine per week    Comment: weekends only with dinner  . Drug use: No  . Sexual activity: Not on file

## 2020-07-12 NOTE — Telephone Encounter (Signed)
Thanks for the update Agree with plan Ludy Messamore, MD  

## 2020-07-13 ENCOUNTER — Telehealth: Payer: Self-pay | Admitting: Pharmacist

## 2020-07-13 ENCOUNTER — Other Ambulatory Visit: Payer: BC Managed Care – PPO

## 2020-07-13 DIAGNOSIS — I2510 Atherosclerotic heart disease of native coronary artery without angina pectoris: Secondary | ICD-10-CM

## 2020-07-13 MED ORDER — REPATHA SURECLICK 140 MG/ML ~~LOC~~ SOAJ: 1.0000 "pen " | SUBCUTANEOUS | 11 refills | Status: DC

## 2020-07-13 NOTE — Telephone Encounter (Signed)
Called pt to discuss results. He was prescribed Repatha 04/23/20 after PharmD appointment. Med was removed from his med list on 12/20 at discharge after CABG. Pt states he has not started Repatha and wanted to wait until after his heart surgery. Results were reviewed with pt, LDL has improved from 135 to 113 with dietary changes, however still remains above goal < 70 given hx of CABG. He is now agreeable to start [place]. New rx sent to pharmacy, f/u labs scheduled in another 2-3 months to assess efficacy. Provided pt with Repatha # to obtain new copay card if he cannot find the card that was provided at his last visit. Pt was appreciative for call and is in agreement with treatment plan.

## 2020-07-16 DIAGNOSIS — M542 Cervicalgia: Secondary | ICD-10-CM | POA: Diagnosis not present

## 2020-07-16 DIAGNOSIS — M5416 Radiculopathy, lumbar region: Secondary | ICD-10-CM | POA: Diagnosis not present

## 2020-07-16 DIAGNOSIS — R29898 Other symptoms and signs involving the musculoskeletal system: Secondary | ICD-10-CM | POA: Diagnosis not present

## 2020-07-16 DIAGNOSIS — M4802 Spinal stenosis, cervical region: Secondary | ICD-10-CM | POA: Diagnosis not present

## 2020-07-20 ENCOUNTER — Other Ambulatory Visit: Payer: Self-pay

## 2020-07-20 ENCOUNTER — Encounter: Payer: Self-pay | Admitting: Cardiology

## 2020-07-20 ENCOUNTER — Ambulatory Visit: Payer: BC Managed Care – PPO | Admitting: Cardiology

## 2020-07-20 VITALS — BP 120/70 | HR 111 | Ht 68.0 in | Wt 140.0 lb

## 2020-07-20 DIAGNOSIS — Z789 Other specified health status: Secondary | ICD-10-CM | POA: Diagnosis not present

## 2020-07-20 DIAGNOSIS — I700 Atherosclerosis of aorta: Secondary | ICD-10-CM | POA: Diagnosis not present

## 2020-07-20 DIAGNOSIS — I25110 Atherosclerotic heart disease of native coronary artery with unstable angina pectoris: Secondary | ICD-10-CM

## 2020-07-20 MED ORDER — ASPIRIN EC 81 MG PO TBEC: 81.0000 mg | DELAYED_RELEASE_TABLET | Freq: Every day | ORAL | 3 refills | Status: DC

## 2020-07-20 NOTE — Patient Instructions (Signed)
Medication Instructions:  Please decrease your Aspirin to 81 mg a day in 2 weeks. Continue all other medications as listed.  *If you need a refill on your cardiac medications before your next appointment, please call your pharmacy*  Follow-Up: At [facility], you and your health needs are our priority.  As part of our continuing mission to provide you with exceptional heart care, we have created designated Provider Care Teams.  These Care Teams include your primary Cardiologist (physician) and Advanced Practice Providers (APPs -  Physician Assistants and Nurse Practitioners) who all work together to provide you with the care you need, when you need it.  We recommend signing up for the patient portal called "MyChart".  Sign up information is provided on this After Visit Summary.  MyChart is used to connect with patients for Virtual Visits (Telemedicine).  Patients are able to view lab/test results, encounter notes, upcoming appointments, etc.  Non-urgent messages can be sent to your provider as well.   To learn more about what you can do with MyChart, go to [email or web address].    Your next appointment:   3 month(s)  The format for your next appointment:   In Person  Provider:   Candee Furbish, MD  Thank you for choosing [facility]!!

## 2020-07-20 NOTE — Progress Notes (Signed)
Cardiology Office Note:    Date:  07/21/2020   ID:  Philip Winters, DOB Jul 03, 1960, MRN [id number]  PCP:  Antony Contras, MD   [place]  Cardiologist:  Candee Furbish, MD  Advanced Practice Provider:  No care team member to display Electrophysiologist:  None      Referring MD: Antony Contras, MD     History of Present Illness:    Philip Winters is a 60 y.o. male with coronary artery disease status post CABG.  Recent telephone call worried about blood circulation in his hands are white and cold and swollen.  Cannot make a fist.  Recently saw Dr. Terrance Mass for this.  CABG x2 LIMA to LAD (90% proximal LAD) and SVG to diagonal on 05/01/2021 by Dr. Darcey Nora.  High risk nuclear stress test.  Had extensive anginal symptoms prior.  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  Has bicuspid aortic valve, 4.1 cm aortic root.  1.5 cm infiltrative density in the superior segment of the left upper lobe which will need to be followed.  Had interesting bilateral hand swelling left greater than right, puffiness that lasted about 2 days.  This subsided.  Dr. Moreen Fowler was able to check some blood work and evaluate.  He is still having trouble with raising his arms above his head, shoulders have been weakened.  Arms are weaker.  He is eager to start cardiac rehab on March 24.  He also saw neurosurgery, Dr. Vertell Limber, he will be seeing neurology as well.  Testing ongoing.  He was able to get a epidural injection on his back which seems to have helped his lower extremities.  He is also waiting with Dr. Durward Fortes to have his left knee replaced.  Optimally 6 to 12 months post bypass would be perfect.  Had sternal cellulitis.  Given doxycycline.  Healed, improved.  Past Medical History:  Diagnosis Date  . Allergy   . Arthritis   . GERD (gastroesophageal reflux disease)    occ  . Heart murmur    baby  . High cholesterol   . Hypertension   . Pneumonia    . Right carotid bruit 06/10/2017  . Sciatica   . Seasonal allergies   . Sinus tachycardia   . Spinal stenosis     Past Surgical History:  Procedure Laterality Date  . CARPAL TUNNEL WITH CUBITAL TUNNEL Left 11/10/2013   Procedure: LEFT CARPAL TUNNEL RELEASE;  Surgeon: Cammie Sickle, MD;  Location: [facility];  Service: Orthopedics;  Laterality: Left;  . COLONOSCOPY    . CORONARY ARTERY BYPASS GRAFT N/A 05/01/2020   Procedure: CORONARY ARTERY BYPASS GRAFTING (CABG) X 2 ON CARDIOPULMONARY BYPASS. LIMA TO LAD, SVG TO DIAG;  Surgeon: Ivin Poot, MD;  Location: [facility];  Service: Open Heart Surgery;  Laterality: N/A;  . ENDOVEIN HARVEST OF GREATER SAPHENOUS VEIN Right 05/01/2020   Procedure: ENDOVEIN HARVEST OF GREATER SAPHENOUS VEIN;  Surgeon: Ivin Poot, MD;  Location: [place];  Service: Open Heart Surgery;  Laterality: Right;  . KNEE ARTHROSCOPY  [phone]   left and right  . LUMBAR LAMINECTOMY/DECOMPRESSION MICRODISCECTOMY Left 09/29/2017   Procedure: LEFT LUMBAR TWO - LUMBAR THREELAMINOTOMY/MICRODISCECTOMY;  Surgeon: Jovita Gamma, MD;  Location: [place];  Service: Neurosurgery;  Laterality: Left;  LEFT LUMBAR 2- LUMBAR 3 LAMINOTOMY/MICRODISCECTOMY  . NASAL SINUS SURGERY  05/2015  . RIGHT/LEFT HEART CATH AND CORONARY ANGIOGRAPHY N/A 04/10/2020   Procedure: RIGHT/LEFT HEART CATH  AND CORONARY ANGIOGRAPHY;  Surgeon: Troy Sine, MD;  Location: [facility];  Service: Cardiovascular;  Laterality: N/A;  . TEE WITHOUT CARDIOVERSION N/A 05/01/2020   Procedure: TRANSESOPHAGEAL ECHOCARDIOGRAM (TEE);  Surgeon: Prescott Gum, Collier [place], MD;  Location: [place];  Service: Open Heart Surgery;  Laterality: N/A;  . TOTAL KNEE ARTHROPLASTY Right 06/24/2016   Procedure: TOTAL KNEE ARTHROPLASTY;  Surgeon: Garald Balding, MD;  Location: [place];  Service: Orthopedics;  Laterality: Right;  . TRIGGER FINGER RELEASE Left 11/10/2013   Procedure: LEFT INDEX A-1 PULLEY RELEASE;  Surgeon:  Cammie Sickle, MD;  Location: [place];  Service: Orthopedics;  Laterality: Left;  . ULNAR NERVE TRANSPOSITION Left 11/10/2013   Procedure: ULNAR NERVE TRANSPOSITION;  Surgeon: Cammie Sickle, MD;  Location: [place];  Service: Orthopedics;  Laterality: Left;    Current Medications: Current Meds  Medication Sig  . ALPRAZolam (XANAX) 0.5 MG tablet Take 1 tablet (0.5 mg total) by mouth daily as needed for anxiety.  Marland Kitchen aspirin EC 81 MG tablet Take 1 tablet (81 mg total) by mouth daily. Swallow whole.  . diclofenac Sodium (VOLTAREN) 1 % GEL Apply 2 g topically daily as needed (Knee and back pain).  Marland Kitchen diltiazem (CARDIZEM CD) 240 MG 24 hr capsule Take 1 capsule (240 mg total) by mouth daily.  . diphenhydrAMINE (BENADRYL) 25 MG tablet Take 12.5 mg by mouth at bedtime.  . Evolocumab (REPATHA SURECLICK) 161 MG/ML SOAJ Inject 1 pen into the skin every 14 (fourteen) days.  . ferrous [id number] C-folic acid (TRINSICON / FOLTRIN) capsule Take 1 capsule by mouth 2 (two) times daily after a meal.  . fluticasone (FLONASE) 50 MCG/ACT nasal spray Place 2 sprays into both nostrils daily as needed for allergies.   . Multiple Vitamin (MULTIVITAMIN WITH MINERALS) TABS tablet Take 1 tablet by mouth daily.  Marland Kitchen tetrahydrozoline 0.05 % ophthalmic solution Place 1 drop into both eyes 3 (three) times daily as needed (for dry/irritated eyes.).  Marland Kitchen traMADol (ULTRAM) 50 MG tablet Take 50 mg by mouth every 6 (six) hours as needed.  . Zinc 50 MG TABS Take 50 mg by mouth daily.  . [DISCONTINUED] aspirin EC 325 MG EC tablet Take 1 tablet (325 mg total) by mouth daily.     Allergies:   Mushroom extract complex, Penicillins, Atorvastatin, Chantix [varenicline], Dilaudid [hydromorphone], Pravastatin, Rosuvastatin, Bystolic [nebivolol hcl], Codeine, Iodine, and Metoprolol   Social History   Socioeconomic History  . Marital status: Married    Spouse name: [name]  . Number of  children: 0  . Years of education: 76  . Highest education level: Not on file  Occupational History  . Occupation: Self employed  Tobacco Use  . Smoking status: Former Smoker    Packs/day: 1.00    Years: 37.00    Pack years: 37.00    Types: Cigarettes    Quit date: 05/04/2016    Years since quitting: 4.2  . Smokeless tobacco: Never Used  Vaping Use  . Vaping Use: Former  Substance and Sexual Activity  . Alcohol use: Yes    Alcohol/week: 3.0 standard drinks    Types: 3 Glasses of wine per week    Comment: weekends only with dinner  . Drug use: No  . Sexual activity: Not on file  Other Topics Concern  . Not on file  Social History Narrative   Lives with wife, [name]   Caffeine use: Coffee daily   Right handed  Social Determinants of Health   Financial Resource Strain: Not on file  Food Insecurity: Not on file  Transportation Needs: Not on file  Physical Activity: Not on file  Stress: Not on file  Social Connections: Not on file     Family History: The patient's family history is not on file.  ROS:   Please see the history of present illness.     All other systems reviewed and are negative.  EKGs/Labs/Other Studies Reviewed:    The following studies were reviewed today:   Recent Labs: 04/30/2020: ALT 23 05/02/2020: Magnesium 2.2 05/06/2020: Hemoglobin 11.9; Platelets 241 05/07/2020: BUN 12; Creatinine, Ser 0.67; Potassium 3.8; Sodium 126  Recent Lipid Panel    Component Value Date/Time   CHOL 165 07/12/2020 1111   TRIG 70 07/12/2020 1111   HDL 38 (L) 07/12/2020 1111   CHOLHDL 4.3 07/12/2020 1111   LDLCALC 113 (H) 07/12/2020 1111     Risk Assessment/Calculations:      Physical Exam:    VS:  BP 120/70 (BP Location: Left Arm, Patient Position: Sitting, Cuff Size: Normal)   Pulse (!) 111   Ht 5\' 8"  (1.727 m)   Wt 140 lb (63.5 kg)   SpO2 99%   BMI 21.29 kg/m     Wt Readings from Last 3 Encounters:  07/20/20 140 lb (63.5 kg)  07/12/20  153 lb (69.4 kg)  06/22/20 153 lb 12.8 oz (69.8 kg)     GEN:  Well nourished, well developed in no acute distress HEENT: Normal NECK: No JVD; No carotid bruits LYMPHATICS: No lymphadenopathy CARDIAC: RRR, no murmurs, rubs, gallops RESPIRATORY:  Clear to auscultation without rales, wheezing or rhonchi  ABDOMEN: Soft, non-tender, non-distended MUSCULOSKELETAL:  No edema; No deformity, difficulty raising his arms above his head SKIN: Warm and dry NEUROLOGIC:  Alert and oriented x 3 PSYCHIATRIC:  Normal affect   ASSESSMENT:    1. Coronary artery disease involving native coronary artery of native heart with unstable angina pectoris (HCC)   2. Statin intolerance   3. Aortic atherosclerosis (HCC)    PLAN:    In order of problems listed above:  Coronary artery disease post CABG 05/01/2020 -Goal-directed medical therapy.  LDL was 135 not on any lipid-lowering agents.  113 LDL at last check.   Repatha starting.  He may decrease his aspirin 81 mg in 2 weeks.  Bicuspid aortic valve -No valve replacement performed at time of surgery secondary to mean gradient of 10 mmHg and 14 mm on echocardiogram.  Statin intolerance -PCSK9 Repatha.  Now starting.  Unable to take statins.  Superior segment of left lobe lower 15 x 11 mm solid nodule -This is followed by surgery.  Weakness -Arms, shoulders.  Getting evaluated by neurosurgery as well as neurology.  Left knee -Arthritis Dr. Durward Fortes.  I would wait at least 6 to 12 months before proceeding with surgery.  Cardiac rehab starting soon March 24.         Medication Adjustments/Labs and Tests Ordered: Current medicines are reviewed at length with the patient today.  Concerns regarding medicines are outlined above.  No orders of the defined types were placed in this encounter.  Meds ordered this encounter  Medications  . aspirin EC 81 MG tablet    Sig: Take 1 tablet (81 mg total) by mouth daily. Swallow whole.    Dispense:  90  tablet    Refill:  3    Patient Instructions  Medication Instructions:  Please decrease your Aspirin to  81 mg a day in 2 weeks. Continue all other medications as listed.  *If you need a refill on your cardiac medications before your next appointment, please call your pharmacy*  Follow-Up: At [facility], you and your health needs are our priority.  As part of our continuing mission to provide you with exceptional heart care, we have created designated Provider Care Teams.  These Care Teams include your primary Cardiologist (physician) and Advanced Practice Providers (APPs -  Physician Assistants and Nurse Practitioners) who all work together to provide you with the care you need, when you need it.  We recommend signing up for the patient portal called "MyChart".  Sign up information is provided on this After Visit Summary.  MyChart is used to connect with patients for Virtual Visits (Telemedicine).  Patients are able to view lab/test results, encounter notes, upcoming appointments, etc.  Non-urgent messages can be sent to your provider as well.   To learn more about what you can do with MyChart, go to [email or web address].    Your next appointment:   3 month(s)  The format for your next appointment:   In Person  Provider:   Candee Furbish, MD  Thank you for choosing [facility]!!        Signed, Candee Furbish, MD  07/21/2020 6:49 AM    [place]

## 2020-07-23 ENCOUNTER — Other Ambulatory Visit: Payer: Self-pay | Admitting: Neurosurgery

## 2020-07-23 DIAGNOSIS — R29898 Other symptoms and signs involving the musculoskeletal system: Secondary | ICD-10-CM

## 2020-07-30 ENCOUNTER — Telehealth (HOSPITAL_COMMUNITY): Payer: Self-pay | Admitting: Student-PharmD

## 2020-08-06 NOTE — Telephone Encounter (Signed)
Cardiac Rehab Medication Review by a Pharmacist  Does the patient  feel that his/her medications are working for him/her?  yes  Has the patient been experiencing any side effects to the medications prescribed?  no  Does the patient measure his/her own blood pressure or blood glucose at home?  yes - normally 120s/80s. HR high, resting is usually 105-110 (asymptomatic, tried agents and they did not work for him)  Does the patient have any problems obtaining medications due to transportation or finances?   no  Understanding of regimen: good Understanding of indications: good Potential of compliance: good  Mercy Riding, PharmD PGY1 Acute Care Pharmacy Resident Please refer to [facility] for unit-specific pharmacist

## 2020-08-07 ENCOUNTER — Telehealth: Payer: Self-pay

## 2020-08-07 MED ORDER — PREDNISONE 50 MG PO TABS: ORAL_TABLET | ORAL | 0 refills | Status: DC

## 2020-08-07 NOTE — Telephone Encounter (Signed)
Phone call to patient to review instructions for 13 hr prep for CT w/ contrast on 08/17/20  at 11:00 AM. Prescription called into [facility]. Pt aware and verbalized understanding of instructions. Prescription: Pt to take 50 mg of prednisone on 08/16/20 at 10:00 PM , 50 mg of prednisone on 08/17/20 at 4:00 AM, and 50 mg of prednisone on 08/17/20 at 10:00 AM. Pt is also to take 50 mg of benadryl on 08/17/20 at 10:00 AM. Please call [phone] with any questions.  Pt reports he has benadryl at home. Therefore, a prescription of Benadryl was not sent in.

## 2020-08-08 ENCOUNTER — Telehealth (HOSPITAL_COMMUNITY): Payer: Self-pay | Admitting: *Deleted

## 2020-08-09 ENCOUNTER — Encounter (HOSPITAL_COMMUNITY)
Admission: RE | Admit: 2020-08-09 | Discharge: 2020-08-09 | Disposition: A | Discharge status: Home / Self Care | Payer: BC Managed Care – PPO | Source: Ambulatory Visit | Attending: Cardiology | Admitting: Cardiology

## 2020-08-09 ENCOUNTER — Other Ambulatory Visit: Payer: Self-pay

## 2020-08-09 ENCOUNTER — Encounter (HOSPITAL_COMMUNITY): Payer: Self-pay

## 2020-08-09 VITALS — BP 138/68 | HR 104 | Ht 66.75 in | Wt 142.9 lb

## 2020-08-09 DIAGNOSIS — Z951 Presence of aortocoronary bypass graft: Secondary | ICD-10-CM | POA: Insufficient documentation

## 2020-08-09 HISTORY — DX: Atherosclerotic heart disease of native coronary artery without angina pectoris: I25.10

## 2020-08-09 NOTE — Progress Notes (Signed)
Cardiac Individual Treatment Plan  Patient Details  Name: Philip Winters MRN: [id number] Date of Birth: 1960-09-10 Referring Provider:   Flowsheet Row CARDIAC REHAB PHASE II ORIENTATION from 08/09/2020 in [facility]  Referring Provider Candee Furbish, MD      Initial Encounter Date:  Flowsheet Row CARDIAC REHAB PHASE II ORIENTATION from 08/09/2020 in [facility]  Date 08/09/20      Visit Diagnosis: 05/01/20 S/P CABG x 2  Patient's Home Medications on Admission:  Current Outpatient Medications:  .  ALPRAZolam (XANAX) 0.5 MG tablet, Take 1 tablet (0.5 mg total) by mouth daily as needed for anxiety. (Patient not taking: Reported on 08/06/2020), Disp: 10 tablet, Rfl: 0 .  aspirin EC 81 MG tablet, Take 1 tablet (81 mg total) by mouth daily. Swallow whole., Disp: 90 tablet, Rfl: 3 .  diclofenac Sodium (VOLTAREN) 1 % GEL, Apply 2 g topically daily as needed (Knee and back pain)., Disp: , Rfl:  .  diltiazem (CARDIZEM CD) 240 MG 24 hr capsule, Take 1 capsule (240 mg total) by mouth daily., Disp: 90 capsule, Rfl: 3 .  diphenhydrAMINE (BENADRYL) 25 MG tablet, Take 12.5 mg by mouth at bedtime., Disp: , Rfl:  .  Evolocumab (REPATHA SURECLICK) 269 MG/ML SOAJ, Inject 1 pen into the skin every 14 (fourteen) days., Disp: 2 mL, Rfl: 11 .  ferrous [id number] C-folic acid (TRINSICON / FOLTRIN) capsule, Take 1 capsule by mouth 2 (two) times daily after a meal. (Patient not taking: Reported on 08/06/2020), Disp: 60 capsule, Rfl: 1 .  fluticasone (FLONASE) 50 MCG/ACT nasal spray, Place 2 sprays into both nostrils daily as needed for allergies. , Disp: , Rfl:  .  gabapentin (NEURONTIN) 300 MG capsule, Take 300 mg by mouth daily., Disp: , Rfl:  .  Multiple Vitamin (MULTIVITAMIN WITH MINERALS) TABS tablet, Take 1 tablet by mouth daily., Disp: , Rfl:  .  naproxen sodium (ALEVE) 220 MG tablet, Take 220 mg by mouth daily as needed., Disp: , Rfl:  .   predniSONE (DELTASONE) 50 MG tablet, Pt to take 50 mg of prednisone on 08/16/20 at 10:00 PM , 50 mg of prednisone on 08/17/20 at 4:00 AM, and 50 mg of prednisone on 08/17/20 at 10:00 AM. Pt is also to take 50 mg of benadryl on 08/17/20 at 10:00 AM. Please call [phone] with any questions., Disp: 3 tablet, Rfl: 0 .  tetrahydrozoline 0.05 % ophthalmic solution, Place 1 drop into both eyes 3 (three) times daily as needed (for dry/irritated eyes.)., Disp: , Rfl:  .  traMADol (ULTRAM) 50 MG tablet, Take 50 mg by mouth every 6 (six) hours as needed. (Patient not taking: Reported on 08/06/2020), Disp: , Rfl:  .  Zinc 50 MG TABS, Take 50 mg by mouth daily., Disp: , Rfl:   Past Medical History: Past Medical History:  Diagnosis Date  . Allergy   . Arthritis   . Coronary artery disease   . GERD (gastroesophageal reflux disease)    occ  . Heart murmur    baby  . High cholesterol   . Hypertension   . Pneumonia   . Right carotid bruit 06/10/2017  . Sciatica   . Seasonal allergies   . Sinus tachycardia   . Spinal stenosis     Tobacco Use: Social History   Tobacco Use  Smoking Status Former Smoker  . Packs/day: 1.00  . Years: 37.00  . Pack years: 37.00  . Types: Cigarettes  .  Quit date: 05/04/2016  . Years since quitting: 4.2  Smokeless Tobacco Never Used    Labs: Recent Review Flowsheet Data    Labs for ITP Cardiac and Pulmonary Rehab Latest Ref Rng & Units 05/01/2020 05/01/2020 05/01/2020 05/02/2020 07/12/2020   Cholestrol 100 - 199 mg/dL - - - - 165   LDLCALC 0 - 99 mg/dL - - - - 113(H)   HDL >39 mg/dL - - - - 38(L)   Trlycerides 0 - 149 mg/dL - - - - 70   Hemoglobin A1c 4.8 - 5.6 % - - - - -   PHART 7.350 - 7.450 7.388 7.350 - - -   PCO2ART 32.0 - 48.0 mmHg 39.3 40.8 - - -   HCO3 20.0 - 28.0 mmol/L 23.5 22.4 - - -   TCO2 22 - 32 mmol/L 25 24 - - -   ACIDBASEDEF 0.0 - 2.0 mmol/L 1.0 3.0(H) - - -   O2SAT % 99.0 98.0 89.5 53.3 -      Capillary Blood Glucose: Lab Results   Component Value Date   GLUCAP 119 (H) 05/07/2020   GLUCAP 110 (H) 05/06/2020   GLUCAP 118 (H) 05/06/2020   GLUCAP 125 (H) 05/06/2020   GLUCAP 112 (H) 05/06/2020     Exercise Target Goals: Exercise Program Goal: Individual exercise prescription set using results from initial 6 min walk test and THRR while considering  patient's activity barriers and safety.   Exercise Prescription Goal: Starting with aerobic activity 30 plus minutes a day, 3 days per week for initial exercise prescription. Provide home exercise prescription and guidelines that participant acknowledges understanding prior to discharge.  Activity Barriers & Risk Stratification:  Activity Barriers & Cardiac Risk Stratification - 08/09/20 1449      Activity Barriers & Cardiac Risk Stratification   Activity Barriers Arthritis;Back Problems;Right Knee Replacement;Neck/Spine Problems;Joint Problems;Deconditioning;Muscular Weakness    Cardiac Risk Stratification High           6 Minute Walk:  6 Minute Walk    Row Name 08/09/20 1350         6 Minute Walk   Phase Discharge     Distance 1326 feet     Walk Time 6 minutes     # of Rest Breaks 0     MPH 2.5     METS 4.3     RPE 9     Perceived Dyspnea  0     VO2 Peak 15.04     Symptoms Yes (comment)     Comments Chronic left knee pain 5/10, chronic low back pain 6/10     Resting HR 92 bpm     Resting BP 138/68     Resting Oxygen Saturation  97 %     Exercise Oxygen Saturation  during 6 min walk 97 %     Max Ex. HR 131 bpm     Max Ex. BP 154/90     2 Minute Post BP 132/80            Oxygen Initial Assessment:   Oxygen Re-Evaluation:   Oxygen Discharge (Final Oxygen Re-Evaluation):   Initial Exercise Prescription:  Initial Exercise Prescription - 08/09/20 1400      Date of Initial Exercise RX and Referring Provider   Date 08/09/20    Referring Provider Candee Furbish, MD    Expected Discharge Date 10/05/20      NuStep   Level 2    SPM 85     Minutes 15  METs 2      Arm Ergometer   Level 1.5    Minutes 15    METs 2      Prescription Details   Frequency (times per week) 3    Duration Progress to 30 minutes of continuous aerobic without signs/symptoms of physical distress      Intensity   THRR 40-80% of Max Heartrate 64-128    Ratings of Perceived Exertion 11-13    Perceived Dyspnea 0-4      Progression   Progression Continue progressive overload as per policy without signs/symptoms or physical distress.      Resistance Training   Training Prescription Yes    Weight 3 lbs    Reps 10-15           Perform Capillary Blood Glucose checks as needed.  Exercise Prescription Changes:   Exercise Comments:   Exercise Goals and Review:   Exercise Goals    Row Name 08/09/20 1450             Exercise Goals   Increase Physical Activity Yes       Intervention Provide advice, education, support and counseling about physical activity/exercise needs.;Develop an individualized exercise prescription for aerobic and resistive training based on initial evaluation findings, risk stratification, comorbidities and participant's personal goals.       Expected Outcomes Short Term: Attend rehab on a regular basis to increase amount of physical activity.;Long Term: Add in home exercise to make exercise part of routine and to increase amount of physical activity.;Long Term: Exercising regularly at least 3-5 days a week.       Increase Strength and Stamina Yes       Intervention Provide advice, education, support and counseling about physical activity/exercise needs.;Develop an individualized exercise prescription for aerobic and resistive training based on initial evaluation findings, risk stratification, comorbidities and participant's personal goals.       Expected Outcomes Short Term: Increase workloads from initial exercise prescription for resistance, speed, and METs.;Short Term: Perform resistance training exercises routinely  during rehab and add in resistance training at home;Long Term: Improve cardiorespiratory fitness, muscular endurance and strength as measured by increased METs and functional capacity (6MWT)       Able to understand and use rate of perceived exertion (RPE) scale Yes       Intervention Provide education and explanation on how to use RPE scale       Expected Outcomes Short Term: Able to use RPE daily in rehab to express subjective intensity level;Long Term:  Able to use RPE to guide intensity level when exercising independently       Knowledge and understanding of Target Heart Rate Range (THRR) Yes       Intervention Provide education and explanation of THRR including how the numbers were predicted and where they are located for reference       Expected Outcomes Short Term: Able to state/look up THRR;Short Term: Able to use daily as guideline for intensity in rehab;Long Term: Able to use THRR to govern intensity when exercising independently       Understanding of Exercise Prescription Yes       Intervention Provide education, explanation, and written materials on patient's individual exercise prescription       Expected Outcomes Short Term: Able to explain program exercise prescription;Long Term: Able to explain home exercise prescription to exercise independently              Exercise Goals Re-Evaluation :  Discharge Exercise Prescription (Final Exercise Prescription Changes):   Nutrition:  Target Goals: Understanding of nutrition guidelines, daily intake of sodium 1500mg , cholesterol 200mg , calories 30% from fat and 7% or less from saturated fats, daily to have 5 or more servings of fruits and vegetables.  Biometrics:  Pre Biometrics - 08/09/20 1315      Pre Biometrics   Waist Circumference 35.5 inches    Hip Circumference 37 inches    Waist to Hip Ratio 0.96 %    Triceps Skinfold 8 mm    % Body Fat 20.9 %    Grip Strength 23 kg    Flexibility --   Not done chroinc back  issues/pain   Single Leg Stand 5.3 seconds   High risk for fall           Nutrition Therapy Plan and Nutrition Goals:   Nutrition Assessments:  MEDIFICTS Score Key:  ?70 Need to make dietary changes   40-70 Heart Healthy Diet  ? 40 Therapeutic Level Cholesterol Diet   Picture Your Plate Scores:  <16 Unhealthy dietary pattern with much room for improvement.  41-50 Dietary pattern unlikely to meet recommendations for good health and room for improvement.  51-60 More healthful dietary pattern, with some room for improvement.   >60 Healthy dietary pattern, although there may be some specific behaviors that could be improved.    Nutrition Goals Re-Evaluation:   Nutrition Goals Discharge (Final Nutrition Goals Re-Evaluation):   Psychosocial: Target Goals: Acknowledge presence or absence of significant depression and/or stress, maximize coping skills, provide positive support system. Participant is able to verbalize types and ability to use techniques and skills needed for reducing stress and depression.  Initial Review & Psychosocial Screening:  Initial Psych Review & Screening - 08/09/20 1640      Initial Review   Current issues with None Identified      Family Dynamics   Good Support System? Yes   Lucciano has his wife for support     Barriers   Psychosocial barriers to participate in program There are no identifiable barriers or psychosocial needs.      Screening Interventions   Interventions Encouraged to exercise           Quality of Life Scores:  Quality of Life - 08/09/20 1442      Quality of Life   Select Quality of Life      Quality of Life Scores   Health/Function Pre 28.36 %    Socioeconomic Pre 30 %    Psych/Spiritual Pre 30 %    Family Pre 30 %    GLOBAL Pre 29.36 %          Scores of 19 and below usually indicate a poorer quality of life in these areas.  A difference of  2-3 points is a clinically meaningful difference.  A difference of  2-3 points in the total score of the Quality of Life Index has been associated with significant improvement in overall quality of life, self-image, physical symptoms, and general health in studies assessing change in quality of life.  PHQ-9: Recent Review Flowsheet Data    Depression screen [facility] 2/9 08/09/2020 09/19/2016 09/18/2016 04/03/2015 04/01/2015   Decreased Interest 0 0 0 0 0   Down, Depressed, Hopeless 0 0 0 0 0   PHQ - 2 Score 0 0 0 0 0     Interpretation of Total Score  Total Score Depression Severity:  1-4 = Minimal depression, 5-9 = Mild depression, 10-14 =  Moderate depression, 15-19 = Moderately severe depression, 20-27 = Severe depression   Psychosocial Evaluation and Intervention:   Psychosocial Re-Evaluation:   Psychosocial Discharge (Final Psychosocial Re-Evaluation):   Vocational Rehabilitation: Provide vocational rehab assistance to qualifying candidates.   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 Participant will state understanding/return demonstration of topics presented.  Learning Barriers/Preferences:  Learning Barriers/Preferences - 08/09/20 1444      Learning Barriers/Preferences   Learning Barriers Sight   wears glasses   Learning Preferences Computer/Internet;Written Material           Education Topics: Hypertension, Hypertension Reduction -Define heart disease and high blood pressure. Discus how high blood pressure affects the body and ways to reduce high blood pressure.   Exercise and Your Heart -Discuss why it is important to exercise, the FITT principles of exercise, normal and abnormal  responses to exercise, and how to exercise safely.   Angina -Discuss definition of angina, causes of angina, treatment of angina, and how to decrease risk of having angina.   Cardiac Medications -Review what the following cardiac medications are used for, how they affect the body, and side effects that may occur when taking the medications.  Medications include Aspirin, Beta blockers, calcium channel blockers, ACE Inhibitors, angiotensin receptor blockers, diuretics, digoxin, and antihyperlipidemics.   Congestive Heart Failure -Discuss the definition of CHF, how to live with CHF, the signs and symptoms of CHF, and how keep track of weight and sodium intake.   Heart Disease and Intimacy -Discus the effect sexual activity has on the heart, how changes occur during intimacy as we age, and safety during sexual activity.   Smoking Cessation / COPD -Discuss different methods to quit smoking, the health benefits of quitting smoking, and the definition of COPD.   Nutrition I: Fats -Discuss the types of cholesterol, what cholesterol does to the heart, and how cholesterol levels can be controlled.   Nutrition II: Labels -Discuss the different components of food labels and how to read food label   Heart Parts/Heart Disease and PAD -Discuss the anatomy of the heart, the pathway of blood circulation through the heart, and these are affected by heart disease.   Stress I: Signs and Symptoms -Discuss the causes of stress, how stress may lead to anxiety and depression, and ways to limit stress.   Stress II: Relaxation -Discuss different types of relaxation techniques to limit stress.   Warning Signs of Stroke / TIA -Discuss definition of a stroke, what the signs and symptoms are of a stroke, and how to identify when someone is having stroke.   Knowledge Questionnaire Score:  Knowledge Questionnaire Score - 08/09/20 1443      Knowledge Questionnaire Score   Pre Score 14/24            Core Components/Risk Factors/Patient Goals at Admission:  Personal Goals and Risk Factors at Admission - 08/09/20 1443      Core Components/Risk Factors/Patient Goals on Admission    Weight Management Weight Gain    Hypertension Yes    Intervention Provide education on lifestyle modifcations including regular physical activity/exercise, weight management, moderate sodium restriction and  increased consumption of fresh fruit, vegetables, and low fat dairy, alcohol moderation, and smoking cessation.;Monitor prescription use compliance.    Expected Outcomes Short Term: Continued assessment and intervention until BP is < 140/52mm HG in hypertensive participants. < 130/24mm HG in hypertensive participants with diabetes, heart failure or chronic kidney disease.;Long Term: Maintenance of blood pressure at goal levels.    Lipids Yes    Intervention Provide education and support for participant on nutrition & aerobic/resistive exercise along with prescribed medications to achieve LDL 70mg , HDL >40mg .    Expected Outcomes Short Term: Participant states understanding of desired cholesterol values and is compliant with medications prescribed. Participant is following exercise prescription and nutrition guidelines.;Long Term: Cholesterol controlled with medications as prescribed, with individualized exercise RX and with personalized nutrition plan. Value goals: LDL < 70mg , HDL > 40 mg.           Core Components/Risk Factors/Patient Goals Review:    Core Components/Risk Factors/Patient Goals at Discharge (Final Review):    ITP Comments:  ITP Comments    Row Name 08/09/20 1328           ITP Comments Dr Fransico Him MD, Medical Director              Comments: Luvenia Heller attended orientation on 08/09/2020 to review rules and guidelines for program.  Completed 6 minute walk test, Intitial ITP, and exercise prescription.  VSS. Telemetry-Sinus tach this is previously documented. Will send today's ECG  tracings over to Dr Kingsley Plan office as the patient exceeded his target heart rate by 3 to 4 beats.Luvenia Heller did report having chronic  Left knee and lower back pain during his walk test.. Safety measures and social distancing in place per CDC guidelines.Barnet Pall, RN,BSN 08/09/2020 4:49 PM

## 2020-08-10 ENCOUNTER — Telehealth (HOSPITAL_COMMUNITY): Payer: Self-pay | Admitting: *Deleted

## 2020-08-10 NOTE — Telephone Encounter (Signed)
-----   Message from Jerline Pain, MD sent at 08/10/2020 11:28 AM EDT ----- Regarding: RE: sinus tach I am fine with his resting heart rate around 100 and his maximum heart rate 130. Thanks Candee Furbish, MD  ----- Message ----- From: Magda Kiel, RN Sent: 08/10/2020  10:37 AM EDT To: Jerline Pain, MD, Shellia Cleverly, RN Subject: sinus tach                                     Good morning Dr Marlou Porch,  Philip Winters completed cardiac rehab orientation and his 6 minute walk test. Philip Winters was in Sinus tach we were able to start his walk test after resting his heart rate came down to around 100. Philip Winters's Maximum heart rate was noted at 133.  Philip Winters's maximum heart rate is 128 for his age. What is an acceptable starting resting  and maximum heart rate for exercise?  Vital  sign are as follows  Entry heart rate 100 BP 138/68 Max heart rate 133 BP 154/90 Exit heart rate 102 BP 132/80  Philip Winters had chronic orthopedic pain otherwise he was asymptomatic.  He will start exercise on Monday.  Philip Winters's last 12 lead showed Sinus tach 111 on 05/21/20.  Thanks for your input Dr Marlou Porch. I will fax the ECG tracings to your office for review.  Philip Winters says that he has a long history of tachycardia. He is 240 of Diltiazem daily and says he has had problems with side effects when meds have been titrated in the past  Sincerely, Barnet Pall RN Cardiac Rehab

## 2020-08-13 ENCOUNTER — Encounter (HOSPITAL_COMMUNITY)
Admission: RE | Admit: 2020-08-13 | Discharge: 2020-08-13 | Disposition: A | Discharge status: Home / Self Care | Payer: BC Managed Care – PPO | Source: Ambulatory Visit | Attending: Cardiology | Admitting: Cardiology

## 2020-08-13 ENCOUNTER — Other Ambulatory Visit: Payer: Self-pay

## 2020-08-13 DIAGNOSIS — Z951 Presence of aortocoronary bypass graft: Secondary | ICD-10-CM | POA: Diagnosis not present

## 2020-08-13 NOTE — Progress Notes (Signed)
Daily Session Note  Patient Details  Name: SABASTION HRDLICKA MRN: [id number] Date of Birth: July 17, 1960 Referring Provider:   Flowsheet Row CARDIAC REHAB PHASE II ORIENTATION from 08/09/2020 in [place]  Referring Provider Candee Furbish, MD      Encounter Date: 08/13/2020  Check In:   Capillary Blood Glucose: No results found for this or any previous visit (from the past 24 hour(s)).   Exercise Prescription Changes - 08/13/20 1600      Response to Exercise   Blood Pressure (Admit) 138/80    Blood Pressure (Exercise) 136/88    Blood Pressure (Exit) 124/68    Heart Rate (Admit) 105 bpm    Heart Rate (Exercise) 137 bpm    Heart Rate (Exit) 106 bpm    Rating of Perceived Exertion (Exercise) 12    Symptoms Chronic left knee pain 5-6/10    Comments Pt's first day of exercise in the [facility] program.    Duration Progress to 30 minutes of  aerobic without signs/symptoms of physical distress    Intensity THRR unchanged      Progression   Progression Continue to progress workloads to maintain intensity without signs/symptoms of physical distress.    Average METs 2.9      Resistance Training   Training Prescription No    Weight --   Weights on hold, pending shoulder MRI 08/14/20     NuStep   Level 2    SPM 95    Minutes 15    METs 2.9      Track   Laps 16    Minutes 15    METs 2.86           Social History   Tobacco Use  Smoking Status Former Smoker  . Packs/day: 1.00  . Years: 37.00  . Pack years: 37.00  . Types: Cigarettes  . Quit date: 05/04/2016  . Years since quitting: 4.2  Smokeless Tobacco Never Used    Goals Met:  Exercise tolerated well No report of cardiac concerns or symptoms  Goals Unmet:  HR  Comments: Jovian started cardiac rehab today.  Pt tolerated light exercise without difficulty. VSS, telemetry-Sinus tach, asymptomatic.  Medication list reconciled. Pt denies barriers to medicaiton compliance.  PSYCHOSOCIAL ASSESSMENT:   PHQ-0. Pt exhibits positive coping skills, hopeful outlook with supportive family. No psychosocial needs identified at this time, no psychosocial interventions necessary.    Pt enjoys reading and spending time with his [place] bull dog.   Pt oriented to exercise equipment and routine.    Understanding verbalized.Barnet Pall, RN,BSN 08/14/2020 2:13 PM   Dr. Fransico Him is Medical Director for Cardiac Rehab at [facility].

## 2020-08-14 ENCOUNTER — Other Ambulatory Visit: Payer: BC Managed Care – PPO

## 2020-08-14 NOTE — Progress Notes (Signed)
Cardiac Individual Treatment Plan  Patient Details  Name: Philip Winters MRN: [id number] Date of Birth: 12/21/1960 Referring Provider:   Flowsheet Row CARDIAC REHAB PHASE II ORIENTATION from 08/09/2020 in [facility]  Referring Provider Candee Furbish, MD      Initial Encounter Date:  Flowsheet Row CARDIAC REHAB PHASE II ORIENTATION from 08/09/2020 in [facility]  Date 08/09/20      Visit Diagnosis: 05/01/20 S/P CABG x 2  Patient's Home Medications on Admission:  Current Outpatient Medications:  .  ALPRAZolam (XANAX) 0.5 MG tablet, Take 1 tablet (0.5 mg total) by mouth daily as needed for anxiety. (Patient not taking: Reported on 08/06/2020), Disp: 10 tablet, Rfl: 0 .  aspirin EC 81 MG tablet, Take 1 tablet (81 mg total) by mouth daily. Swallow whole., Disp: 90 tablet, Rfl: 3 .  diclofenac Sodium (VOLTAREN) 1 % GEL, Apply 2 g topically daily as needed (Knee and back pain)., Disp: , Rfl:  .  diltiazem (CARDIZEM CD) 240 MG 24 hr capsule, Take 1 capsule (240 mg total) by mouth daily., Disp: 90 capsule, Rfl: 3 .  diphenhydrAMINE (BENADRYL) 25 MG tablet, Take 12.5 mg by mouth at bedtime., Disp: , Rfl:  .  Evolocumab (REPATHA SURECLICK) 673 MG/ML SOAJ, Inject 1 pen into the skin every 14 (fourteen) days., Disp: 2 mL, Rfl: 11 .  ferrous [id number] C-folic acid (TRINSICON / FOLTRIN) capsule, Take 1 capsule by mouth 2 (two) times daily after a meal. (Patient not taking: Reported on 08/06/2020), Disp: 60 capsule, Rfl: 1 .  fluticasone (FLONASE) 50 MCG/ACT nasal spray, Place 2 sprays into both nostrils daily as needed for allergies. , Disp: , Rfl:  .  gabapentin (NEURONTIN) 300 MG capsule, Take 300 mg by mouth daily., Disp: , Rfl:  .  Multiple Vitamin (MULTIVITAMIN WITH MINERALS) TABS tablet, Take 1 tablet by mouth daily., Disp: , Rfl:  .  naproxen sodium (ALEVE) 220 MG tablet, Take 220 mg by mouth daily as needed., Disp: , Rfl:  .   predniSONE (DELTASONE) 50 MG tablet, Pt to take 50 mg of prednisone on 08/16/20 at 10:00 PM , 50 mg of prednisone on 08/17/20 at 4:00 AM, and 50 mg of prednisone on 08/17/20 at 10:00 AM. Pt is also to take 50 mg of benadryl on 08/17/20 at 10:00 AM. Please call [phone] with any questions., Disp: 3 tablet, Rfl: 0 .  tetrahydrozoline 0.05 % ophthalmic solution, Place 1 drop into both eyes 3 (three) times daily as needed (for dry/irritated eyes.)., Disp: , Rfl:  .  traMADol (ULTRAM) 50 MG tablet, Take 50 mg by mouth every 6 (six) hours as needed. (Patient not taking: Reported on 08/06/2020), Disp: , Rfl:  .  Zinc 50 MG TABS, Take 50 mg by mouth daily., Disp: , Rfl:   Past Medical History: Past Medical History:  Diagnosis Date  . Allergy   . Arthritis   . Coronary artery disease   . GERD (gastroesophageal reflux disease)    occ  . Heart murmur    baby  . High cholesterol   . Hypertension   . Pneumonia   . Right carotid bruit 06/10/2017  . Sciatica   . Seasonal allergies   . Sinus tachycardia   . Spinal stenosis     Tobacco Use: Social History   Tobacco Use  Smoking Status Former Smoker  . Packs/day: 1.00  . Years: 37.00  . Pack years: 37.00  . Types: Cigarettes  .  Quit date: 05/04/2016  . Years since quitting: 4.2  Smokeless Tobacco Never Used    Labs: Recent Review Flowsheet Data    Labs for ITP Cardiac and Pulmonary Rehab Latest Ref Rng & Units 05/01/2020 05/01/2020 05/01/2020 05/02/2020 07/12/2020   Cholestrol 100 - 199 mg/dL - - - - 165   LDLCALC 0 - 99 mg/dL - - - - 113(H)   HDL >39 mg/dL - - - - 38(L)   Trlycerides 0 - 149 mg/dL - - - - 70   Hemoglobin A1c 4.8 - 5.6 % - - - - -   PHART 7.350 - 7.450 7.388 7.350 - - -   PCO2ART 32.0 - 48.0 mmHg 39.3 40.8 - - -   HCO3 20.0 - 28.0 mmol/L 23.5 22.4 - - -   TCO2 22 - 32 mmol/L 25 24 - - -   ACIDBASEDEF 0.0 - 2.0 mmol/L 1.0 3.0(H) - - -   O2SAT % 99.0 98.0 89.5 53.3 -      Capillary Blood Glucose: Lab Results   Component Value Date   GLUCAP 119 (H) 05/07/2020   GLUCAP 110 (H) 05/06/2020   GLUCAP 118 (H) 05/06/2020   GLUCAP 125 (H) 05/06/2020   GLUCAP 112 (H) 05/06/2020     Exercise Target Goals: Exercise Program Goal: Individual exercise prescription set using results from initial 6 min walk test and THRR while considering  patient's activity barriers and safety.   Exercise Prescription Goal: Starting with aerobic activity 30 plus minutes a day, 3 days per week for initial exercise prescription. Provide home exercise prescription and guidelines that participant acknowledges understanding prior to discharge.  Activity Barriers & Risk Stratification:  Activity Barriers & Cardiac Risk Stratification - 08/09/20 1449      Activity Barriers & Cardiac Risk Stratification   Activity Barriers Arthritis;Back Problems;Right Knee Replacement;Neck/Spine Problems;Joint Problems;Deconditioning;Muscular Weakness    Cardiac Risk Stratification High           6 Minute Walk:  6 Minute Walk    Row Name 08/09/20 1350         6 Minute Walk   Phase Discharge     Distance 1326 feet     Walk Time 6 minutes     # of Rest Breaks 0     MPH 2.5     METS 4.3     RPE 9     Perceived Dyspnea  0     VO2 Peak 15.04     Symptoms Yes (comment)     Comments Chronic left knee pain 5/10, chronic low back pain 6/10     Resting HR 92 bpm     Resting BP 138/68     Resting Oxygen Saturation  97 %     Exercise Oxygen Saturation  during 6 min walk 97 %     Max Ex. HR 131 bpm     Max Ex. BP 154/90     2 Minute Post BP 132/80            Oxygen Initial Assessment:   Oxygen Re-Evaluation:   Oxygen Discharge (Final Oxygen Re-Evaluation):   Initial Exercise Prescription:  Initial Exercise Prescription - 08/09/20 1400      Date of Initial Exercise RX and Referring Provider   Date 08/09/20    Referring Provider Candee Furbish, MD    Expected Discharge Date 10/05/20      NuStep   Level 2    SPM 85     Minutes 15  METs 2      Arm Ergometer   Level 1.5    Minutes 15    METs 2      Prescription Details   Frequency (times per week) 3    Duration Progress to 30 minutes of continuous aerobic without signs/symptoms of physical distress      Intensity   THRR 40-80% of Max Heartrate 64-128    Ratings of Perceived Exertion 11-13    Perceived Dyspnea 0-4      Progression   Progression Continue progressive overload as per policy without signs/symptoms or physical distress.      Resistance Training   Training Prescription Yes    Weight 3 lbs    Reps 10-15           Perform Capillary Blood Glucose checks as needed.  Exercise Prescription Changes:  Exercise Prescription Changes    Row Name 08/13/20 1600             Response to Exercise   Blood Pressure (Admit) 138/80       Blood Pressure (Exercise) 136/88       Blood Pressure (Exit) 124/68       Heart Rate (Admit) 105 bpm       Heart Rate (Exercise) 137 bpm       Heart Rate (Exit) 106 bpm       Rating of Perceived Exertion (Exercise) 12       Symptoms Chronic left knee pain 5-6/10       Comments Pt's first day of exercise in the [facility] program.       Duration Progress to 30 minutes of  aerobic without signs/symptoms of physical distress       Intensity THRR unchanged               Progression   Progression Continue to progress workloads to maintain intensity without signs/symptoms of physical distress.       Average METs 2.9               Resistance Training   Training Prescription No       Weight --  Weights on hold, pending shoulder MRI 08/14/20               NuStep   Level 2       SPM 95       Minutes 15       METs 2.9               Track   Laps 16       Minutes 15       METs 2.86              Exercise Comments:  Exercise Comments    Row Name 08/13/20 1648           Exercise Comments Pt's first day in the [facility] program. Pt tolerated seesion well with c/o chronic knee pain 5-6/10 on Nustep.               Exercise Goals and Review:  Exercise Goals    Row Name 08/09/20 1450             Exercise Goals   Increase Physical Activity Yes       Intervention Provide advice, education, support and counseling about physical activity/exercise needs.;Develop an individualized exercise prescription for aerobic and resistive training based on initial evaluation findings, risk stratification, comorbidities and participant's personal goals.  Expected Outcomes Short Term: Attend rehab on a regular basis to increase amount of physical activity.;Long Term: Add in home exercise to make exercise part of routine and to increase amount of physical activity.;Long Term: Exercising regularly at least 3-5 days a week.       Increase Strength and Stamina Yes       Intervention Provide advice, education, support and counseling about physical activity/exercise needs.;Develop an individualized exercise prescription for aerobic and resistive training based on initial evaluation findings, risk stratification, comorbidities and participant's personal goals.       Expected Outcomes Short Term: Increase workloads from initial exercise prescription for resistance, speed, and METs.;Short Term: Perform resistance training exercises routinely during rehab and add in resistance training at home;Long Term: Improve cardiorespiratory fitness, muscular endurance and strength as measured by increased METs and functional capacity (6MWT)       Able to understand and use rate of perceived exertion (RPE) scale Yes       Intervention Provide education and explanation on how to use RPE scale       Expected Outcomes Short Term: Able to use RPE daily in rehab to express subjective intensity level;Long Term:  Able to use RPE to guide intensity level when exercising independently       Knowledge and understanding of Target Heart Rate Range (THRR) Yes       Intervention Provide education and explanation of THRR including how the  numbers were predicted and where they are located for reference       Expected Outcomes Short Term: Able to state/look up THRR;Short Term: Able to use daily as guideline for intensity in rehab;Long Term: Able to use THRR to govern intensity when exercising independently       Understanding of Exercise Prescription Yes       Intervention Provide education, explanation, and written materials on patient's individual exercise prescription       Expected Outcomes Short Term: Able to explain program exercise prescription;Long Term: Able to explain home exercise prescription to exercise independently              Exercise Goals Re-Evaluation :  Exercise Goals Re-Evaluation    Row Name 08/13/20 1646             Exercise Goal Re-Evaluation   Exercise Goals Review Increase Physical Activity;Increase Strength and Stamina;Able to understand and use rate of perceived exertion (RPE) scale;Knowledge and understanding of Target Heart Rate Range (THRR);Understanding of Exercise Prescription       Comments Pt's first day of exercise in the [facility] program. P understands the Exercise Rx, THRR, and RPE scale.       Expected Outcomes Will continue to monitor patients and progress exercise workloads as tolerated,               Discharge Exercise Prescription (Final Exercise Prescription Changes):  Exercise Prescription Changes - 08/13/20 1600      Response to Exercise   Blood Pressure (Admit) 138/80    Blood Pressure (Exercise) 136/88    Blood Pressure (Exit) 124/68    Heart Rate (Admit) 105 bpm    Heart Rate (Exercise) 137 bpm    Heart Rate (Exit) 106 bpm    Rating of Perceived Exertion (Exercise) 12    Symptoms Chronic left knee pain 5-6/10    Comments Pt's first day of exercise in the [facility] program.    Duration Progress to 30 minutes of  aerobic without signs/symptoms of physical distress    Intensity  THRR unchanged      Progression   Progression Continue to progress workloads to maintain  intensity without signs/symptoms of physical distress.    Average METs 2.9      Resistance Training   Training Prescription No    Weight --   Weights on hold, pending shoulder MRI 08/14/20     NuStep   Level 2    SPM 95    Minutes 15    METs 2.9      Track   Laps 16    Minutes 15    METs 2.86           Nutrition:  Target Goals: Understanding of nutrition guidelines, daily intake of sodium 1500mg , cholesterol 200mg , calories 30% from fat and 7% or less from saturated fats, daily to have 5 or more servings of fruits and vegetables.  Biometrics:  Pre Biometrics - 08/09/20 1315      Pre Biometrics   Waist Circumference 35.5 inches    Hip Circumference 37 inches    Waist to Hip Ratio 0.96 %    Triceps Skinfold 8 mm    % Body Fat 20.9 %    Grip Strength 23 kg    Flexibility --   Not done chroinc back issues/pain   Single Leg Stand 5.3 seconds   High risk for fall           Nutrition Therapy Plan and Nutrition Goals:  Nutrition Therapy & Goals - 08/13/20 1416      Nutrition Therapy   RD appointment deferred Yes           Nutrition Assessments:  MEDIFICTS Score Key:  ?70 Need to make dietary changes   40-70 Heart Healthy Diet  ? 40 Therapeutic Level Cholesterol Diet   Picture Your Plate Scores:  <79 Unhealthy dietary pattern with much room for improvement.  41-50 Dietary pattern unlikely to meet recommendations for good health and room for improvement.  51-60 More healthful dietary pattern, with some room for improvement.   >60 Healthy dietary pattern, although there may be some specific behaviors that could be improved.    Nutrition Goals Re-Evaluation:   Nutrition Goals Discharge (Final Nutrition Goals Re-Evaluation):   Psychosocial: Target Goals: Acknowledge presence or absence of significant depression and/or stress, maximize coping skills, provide positive support system. Participant is able to verbalize types and ability to use  techniques and skills needed for reducing stress and depression.  Initial Review & Psychosocial Screening:  Initial Psych Review & Screening - 08/09/20 1640      Initial Review   Current issues with None Identified      Family Dynamics   Good Support System? Yes   Macari has his wife for support     Barriers   Psychosocial barriers to participate in program There are no identifiable barriers or psychosocial needs.      Screening Interventions   Interventions Encouraged to exercise           Quality of Life Scores:  Quality of Life - 08/09/20 1442      Quality of Life   Select Quality of Life      Quality of Life Scores   Health/Function Pre 28.36 %    Socioeconomic Pre 30 %    Psych/Spiritual Pre 30 %    Family Pre 30 %    GLOBAL Pre 29.36 %          Scores of 19 and below usually indicate a poorer quality  of life in these areas.  A difference of  2-3 points is a clinically meaningful difference.  A difference of 2-3 points in the total score of the Quality of Life Index has been associated with significant improvement in overall quality of life, self-image, physical symptoms, and general health in studies assessing change in quality of life.  PHQ-9: Recent Review Flowsheet Data    Depression screen [facility] 2/9 08/09/2020 09/19/2016 09/18/2016 04/03/2015 04/01/2015   Decreased Interest 0 0 0 0 0   Down, Depressed, Hopeless 0 0 0 0 0   PHQ - 2 Score 0 0 0 0 0     Interpretation of Total Score  Total Score Depression Severity:  1-4 = Minimal depression, 5-9 = Mild depression, 10-14 = Moderate depression, 15-19 = Moderately severe depression, 20-27 = Severe depression   Psychosocial Evaluation and Intervention:   Psychosocial Re-Evaluation:  Psychosocial Re-Evaluation    [place] Name 08/14/20 1416             Psychosocial Re-Evaluation   Current issues with None Identified       Interventions Encouraged to attend Cardiac Rehabilitation for the exercise       Continue  Psychosocial Services  No Follow up required              Psychosocial Discharge (Final Psychosocial Re-Evaluation):  Psychosocial Re-Evaluation - 08/14/20 1416      Psychosocial Re-Evaluation   Current issues with None Identified    Interventions Encouraged to attend Cardiac Rehabilitation for the exercise    Continue Psychosocial Services  No Follow up required           Vocational Rehabilitation: Provide vocational rehab assistance to qualifying candidates.   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 Participant will state understanding/return demonstration of topics presented.  Learning Barriers/Preferences:  Learning Barriers/Preferences - 08/09/20 1444      Learning Barriers/Preferences   Learning Barriers Sight   wears glasses   Learning Preferences Computer/Internet;Written Material           Education Topics: Hypertension, Hypertension Reduction -Define heart disease and high blood pressure. Discus how high blood pressure affects the body and ways to reduce high blood pressure.   Exercise and Your Heart -Discuss why it is important to exercise, the FITT principles of exercise, normal and abnormal responses to exercise, and how to exercise safely.   Angina -Discuss definition of angina, causes of angina, treatment of angina, and how to decrease risk of having angina.   Cardiac Medications -Review what the following cardiac medications are used for, how they affect the body, and side effects that may occur when taking the medications.  Medications include Aspirin, Beta blockers, calcium channel blockers, ACE  Inhibitors, angiotensin receptor blockers, diuretics, digoxin, and antihyperlipidemics.   Congestive Heart Failure -Discuss the definition of CHF, how to live with CHF, the signs and symptoms of CHF, and how keep track of weight and sodium intake.   Heart Disease and Intimacy -Discus the effect sexual activity has on the heart, how changes occur during intimacy as we age, and safety during sexual activity.   Smoking Cessation /  COPD -Discuss different methods to quit smoking, the health benefits of quitting smoking, and the definition of COPD.   Nutrition I: Fats -Discuss the types of cholesterol, what cholesterol does to the heart, and how cholesterol levels can be controlled.   Nutrition II: Labels -Discuss the different components of food labels and how to read food label   Heart Parts/Heart Disease and PAD -Discuss the anatomy of the heart, the pathway of blood circulation through the heart, and these are affected by heart disease.   Stress I: Signs and Symptoms -Discuss the causes of stress, how stress may lead to anxiety and depression, and ways to limit stress.   Stress II: Relaxation -Discuss different types of relaxation techniques to limit stress.   Warning Signs of Stroke / TIA -Discuss definition of a stroke, what the signs and symptoms are of a stroke, and how to identify when someone is having stroke.   Knowledge Questionnaire Score:  Knowledge Questionnaire Score - 08/09/20 1443      Knowledge Questionnaire Score   Pre Score 14/24           Core Components/Risk Factors/Patient Goals at Admission:  Personal Goals and Risk Factors at Admission - 08/09/20 1443      Core Components/Risk Factors/Patient Goals on Admission    Weight Management Weight Gain    Hypertension Yes    Intervention Provide education on lifestyle modifcations including regular physical activity/exercise, weight management, moderate sodium restriction and increased consumption of  fresh fruit, vegetables, and low fat dairy, alcohol moderation, and smoking cessation.;Monitor prescription use compliance.    Expected Outcomes Short Term: Continued assessment and intervention until BP is < 140/68mm HG in hypertensive participants. < 130/65mm HG in hypertensive participants with diabetes, heart failure or chronic kidney disease.;Long Term: Maintenance of blood pressure at goal levels.    Lipids Yes    Intervention Provide education and support for participant on nutrition & aerobic/resistive exercise along with prescribed medications to achieve LDL 70mg , HDL >40mg .    Expected Outcomes Short Term: Participant states understanding of desired cholesterol values and is compliant with medications prescribed. Participant is following exercise prescription and nutrition guidelines.;Long Term: Cholesterol controlled with medications as prescribed, with individualized exercise RX and with personalized nutrition plan. Value goals: LDL < 70mg , HDL > 40 mg.           Core Components/Risk Factors/Patient Goals Review:   Goals and Risk Factor Review    Row Name 08/14/20 1417             Core Components/Risk Factors/Patient Goals Review   Personal Goals Review Weight Management/Obesity;Hypertension;Lipids       Review Jarius started exercise on 08/13/20 and did fair with exercise. Luvenia Heller did report having chronic knee pain       Expected Outcomes Maverik will continue to participate in phase 2 cardiac rehab for exercise, nutrition and lifestyle modifications              Core Components/Risk Factors/Patient Goals at Discharge (Final Review):   Goals and Risk Factor Review - 08/14/20 1417      Core Components/Risk Factors/Patient Goals Review   Personal Goals Review Weight Management/Obesity;Hypertension;Lipids    Review Goro started exercise on 08/13/20 and did fair with exercise. Luvenia Heller did report having chronic knee pain    Expected Outcomes Karron will continue to participate in phase 2  cardiac rehab for exercise, nutrition and lifestyle modifications           ITP Comments:  ITP Comments    Row Name 08/09/20 1328 08/14/20 1414         ITP Comments Dr Fransico Him MD, Medical Director 30 Day ITP Review. Lejend started exercise on 08/13/20 and did well with exercise. Patient remains with chronic  sinus tachycardia             Comments: See ITP comments.Barnet Pall, RN,BSN 08/14/2020 2:20 PM

## 2020-08-15 ENCOUNTER — Other Ambulatory Visit: Payer: Self-pay

## 2020-08-15 ENCOUNTER — Encounter (HOSPITAL_COMMUNITY)
Admission: RE | Admit: 2020-08-15 | Discharge: 2020-08-15 | Disposition: A | Discharge status: Home / Self Care | Payer: BC Managed Care – PPO | Source: Ambulatory Visit | Attending: Cardiology | Admitting: Cardiology

## 2020-08-15 DIAGNOSIS — Z951 Presence of aortocoronary bypass graft: Secondary | ICD-10-CM | POA: Diagnosis not present

## 2020-08-16 ENCOUNTER — Ambulatory Visit
Admission: RE | Admit: 2020-08-16 | Discharge: 2020-08-16 | Disposition: A | Discharge status: Home / Self Care | Payer: BC Managed Care – PPO | Source: Ambulatory Visit | Attending: Neurosurgery | Admitting: Neurosurgery

## 2020-08-16 DIAGNOSIS — M25412 Effusion, left shoulder: Secondary | ICD-10-CM | POA: Diagnosis not present

## 2020-08-16 DIAGNOSIS — M19012 Primary osteoarthritis, left shoulder: Secondary | ICD-10-CM | POA: Diagnosis not present

## 2020-08-16 DIAGNOSIS — R531 Weakness: Secondary | ICD-10-CM | POA: Diagnosis not present

## 2020-08-16 DIAGNOSIS — R29898 Other symptoms and signs involving the musculoskeletal system: Secondary | ICD-10-CM

## 2020-08-16 IMAGING — MR MR SHOULDER*L* W/O CM
5 series · 39 of 40 positions shown · non-contrast
Comparison: None.

CLINICAL DATA: Left shoulder pain and weakness.  No known injury.

EXAM:
MRI OF THE LEFT SHOULDER WITHOUT CONTRAST
TECHNIQUE: Multiplanar, multisequence MR imaging of the shoulder was performed.
No intravenous contrast was administered.

[Series 3: T2 fat-sat · axial · 4.0mm · 0.27mm/px · z∈[-78,+67]mm · 11 of 30 slices shown (1 of 3)]
[im 1/30]
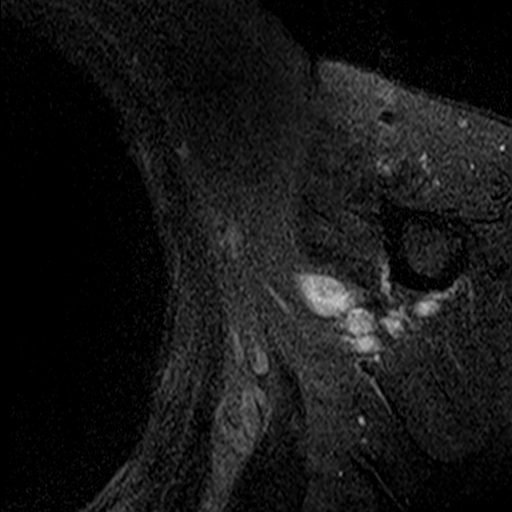
[im 3/30]
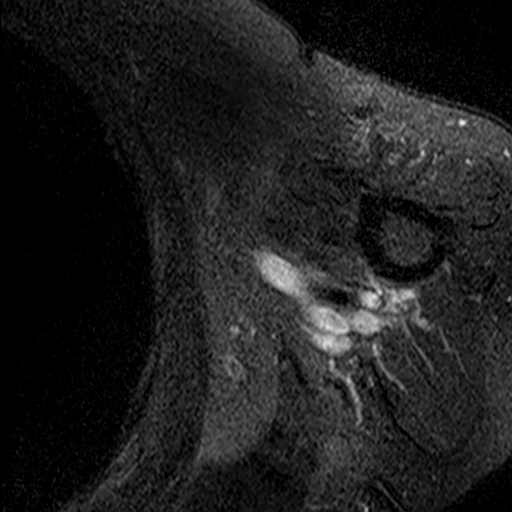
[im 6/30]
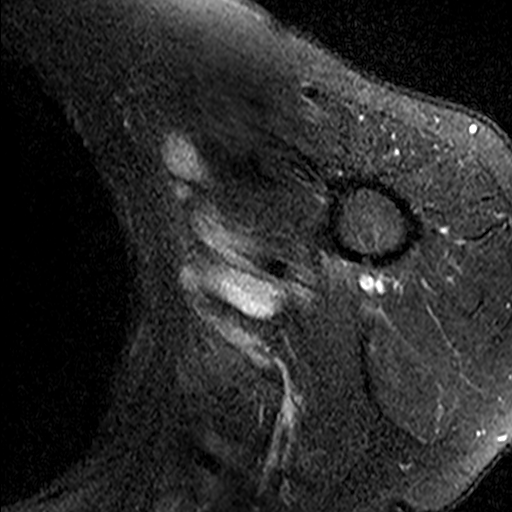
[im 8/30]
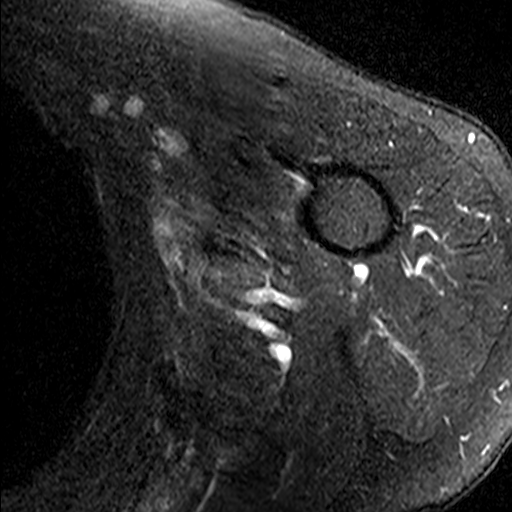
[im 11/30]
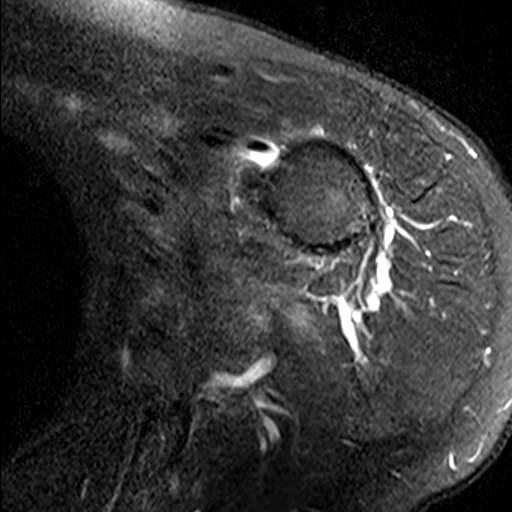
[im 14/30]
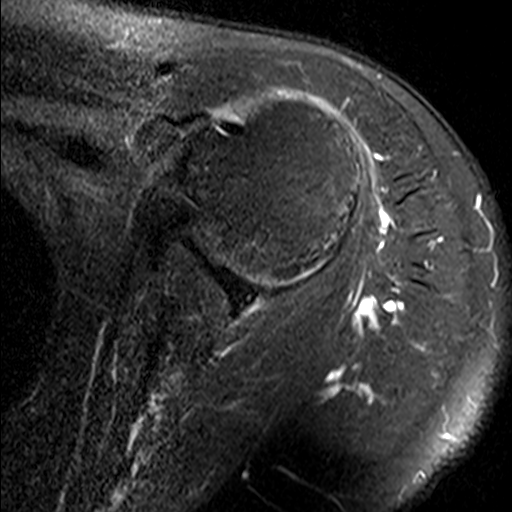
[im 16/30]
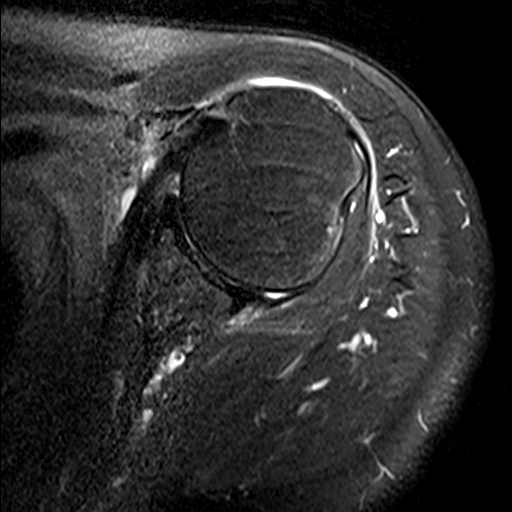
[im 22/30]
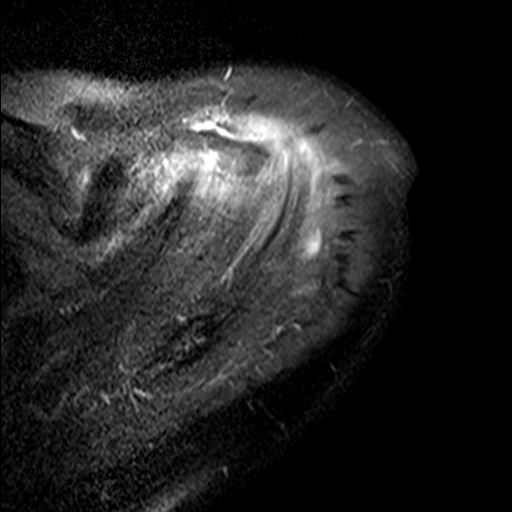
[im 24/30]
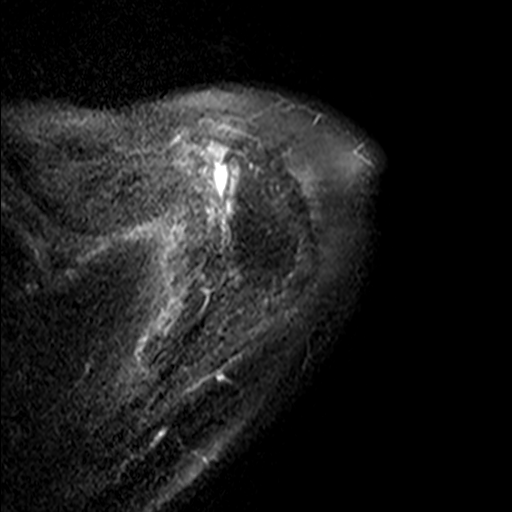
[im 27/30]
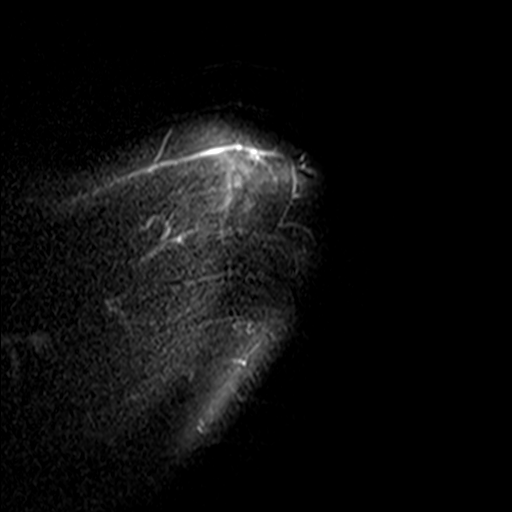
[im 30/30]
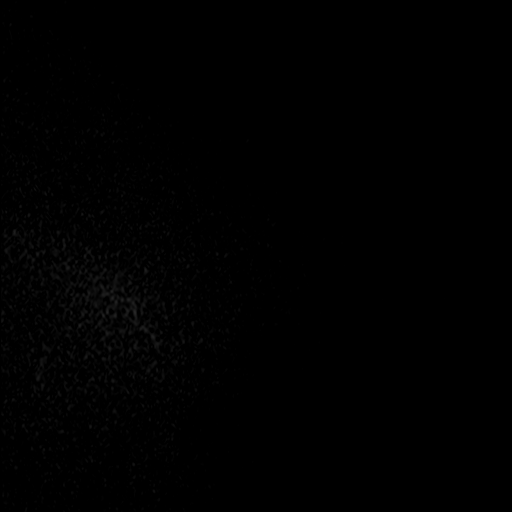

[Series 4: T2 fat-sat · oblique · 4.0mm · 0.59mm/px · 6 of 18 slices shown (2 of 3)]
[im 1/18]
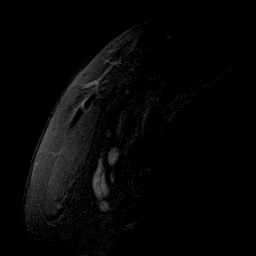
[im 4/18]
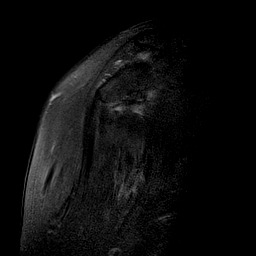
[im 7/18]
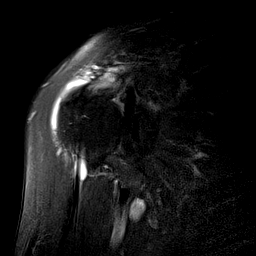
[im 11/18]
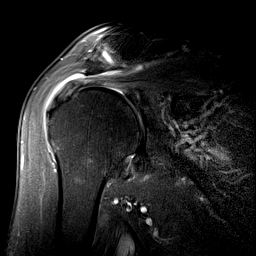
[im 14/18]
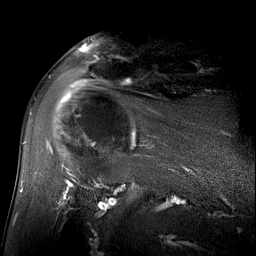
[im 18/18]
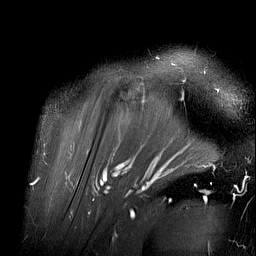

[Series 5: PD · oblique · 4.0mm · 0.59mm/px · 6 of 18 slices shown]
[im 1/18]
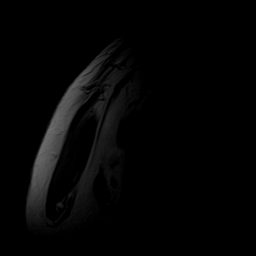
[im 4/18]
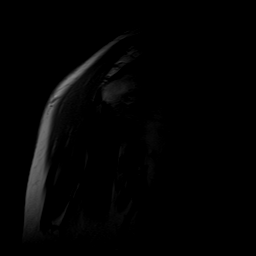
[im 7/18]
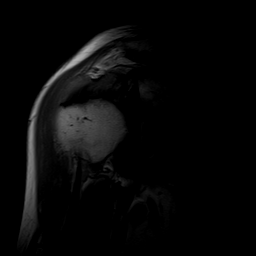
[im 11/18]
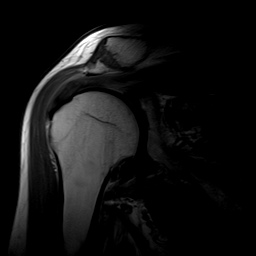
[im 14/18]
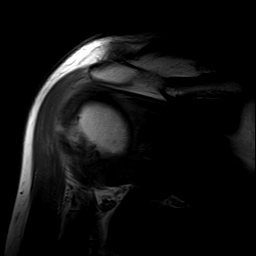
[im 18/18]
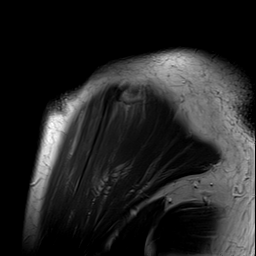

[Series 6: T2 fat-sat · oblique · 4.0mm · 0.59mm/px · 8 of 24 slices shown (3 of 3)]
[im 1/24]
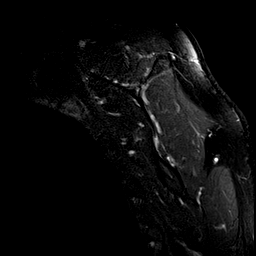
[im 4/24]
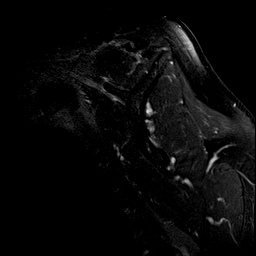
[im 7/24]
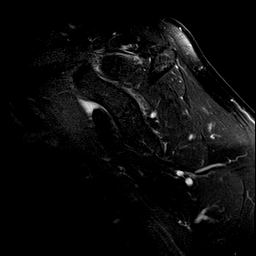
[im 10/24]
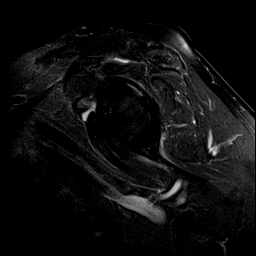
[im 14/24]
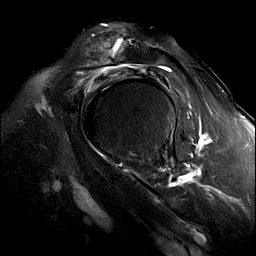
[im 17/24]
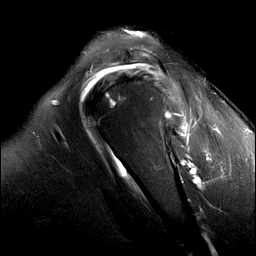
[im 20/24]
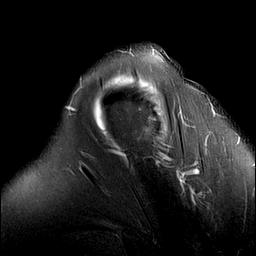
[im 24/24]
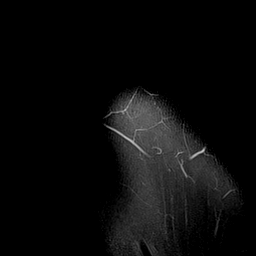

[Series 7: T1 · oblique · 4.0mm · 0.59mm/px · 8 of 24 slices shown]
[im 1/24]
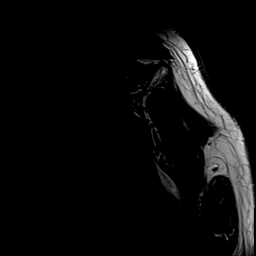
[im 4/24]
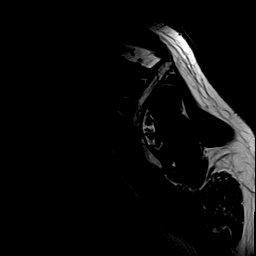
[im 7/24]
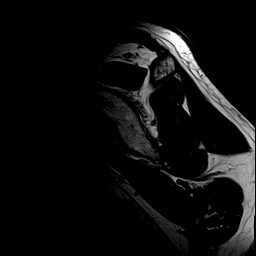
[im 10/24]
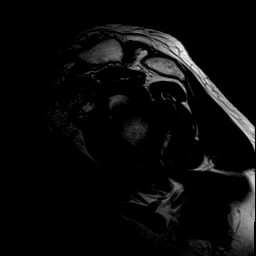
[im 14/24]
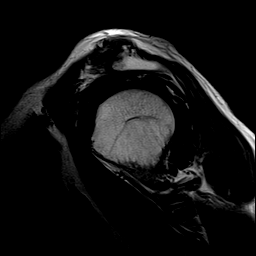
[im 17/24]
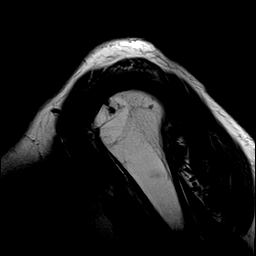
[im 20/24]
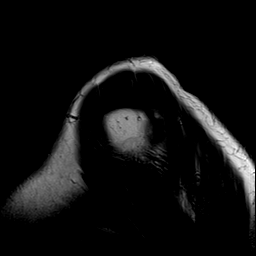
[im 24/24]
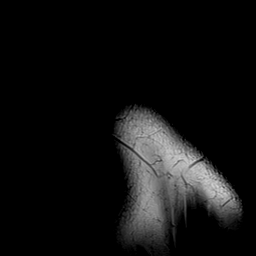

[39 of 40 positions shown; findings below may reference images not displayed]

FINDINGS: Rotator cuff: Intact. Rotator cuff tendinopathy appears worst in the
supraspinatus and infraspinatus.

Muscles:  No atrophy or focal lesion.

Biceps long head:  Intact.

Acromioclavicular Joint: Bulky osteoarthritis. Type 1 acromion. A
moderately large volume of fluid is seen in the
subacromial/subdeltoid bursa.

Glenohumeral Joint: There is mild thickening and intermediate
increased T2 signal in the inferior glenohumeral ligament compatible
with adhesive capsulitis.

Labrum:  Intact.

Bones:  No fracture, contusion or focal lesion.

Other: None.
IMPRESSION: Rotator cuff tendinopathy without tear appears worst in the
supraspinatus and infraspinatus.

Bulky acromioclavicular osteoarthritis.

Moderately large volume of subacromial/subdeltoid fluid consistent
with bursitis.

Findings compatible with adhesive capsulitis.

## 2020-08-17 ENCOUNTER — Encounter: Payer: Self-pay | Admitting: Thoracic Surgery (Cardiothoracic Vascular Surgery)

## 2020-08-17 ENCOUNTER — Encounter (HOSPITAL_COMMUNITY): Payer: BC Managed Care – PPO

## 2020-08-17 ENCOUNTER — Other Ambulatory Visit: Payer: Self-pay

## 2020-08-17 ENCOUNTER — Ambulatory Visit: Payer: BC Managed Care – PPO | Admitting: Thoracic Surgery (Cardiothoracic Vascular Surgery)

## 2020-08-17 ENCOUNTER — Ambulatory Visit
Admission: RE | Admit: 2020-08-17 | Discharge: 2020-08-17 | Disposition: A | Discharge status: Home / Self Care | Payer: BC Managed Care – PPO | Source: Ambulatory Visit | Attending: Thoracic Surgery (Cardiothoracic Vascular Surgery) | Admitting: Thoracic Surgery (Cardiothoracic Vascular Surgery)

## 2020-08-17 VITALS — BP 137/88 | HR 127 | Resp 20 | Ht 66.0 in | Wt 146.0 lb

## 2020-08-17 DIAGNOSIS — R911 Solitary pulmonary nodule: Secondary | ICD-10-CM

## 2020-08-17 DIAGNOSIS — I2510 Atherosclerotic heart disease of native coronary artery without angina pectoris: Secondary | ICD-10-CM | POA: Diagnosis not present

## 2020-08-17 DIAGNOSIS — I712 Thoracic aortic aneurysm, without rupture: Secondary | ICD-10-CM | POA: Diagnosis not present

## 2020-08-17 DIAGNOSIS — I700 Atherosclerosis of aorta: Secondary | ICD-10-CM | POA: Diagnosis not present

## 2020-08-17 DIAGNOSIS — R918 Other nonspecific abnormal finding of lung field: Secondary | ICD-10-CM | POA: Diagnosis not present

## 2020-08-17 IMAGING — CT CT CHEST W/ CM
2 of 4 series · 15 of 36 positions shown, 18 images · IV contrast (iopamidol)
Comparison: [DATE]

CLINICAL DATA: Follow-up lung nodule.

EXAM:
CT CHEST WITH CONTRAST
TECHNIQUE: Multidetector CT imaging of the chest was performed during
intravenous contrast administration.
CONTRAST:  75mL [0C] IOPAMIDOL ([0C]) INJECTION 61%

[Series 2: chest 2.00 br40 s3 · axial · 0.67mm/px · z∈[+1595,+1859]mm · 12 of 158 slices shown, 15 images (1 of 2)]
[im 13/158  mediastinal]
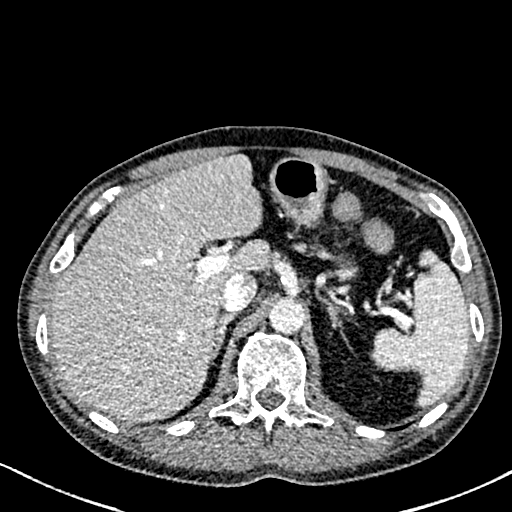
[im 13/158  lung]
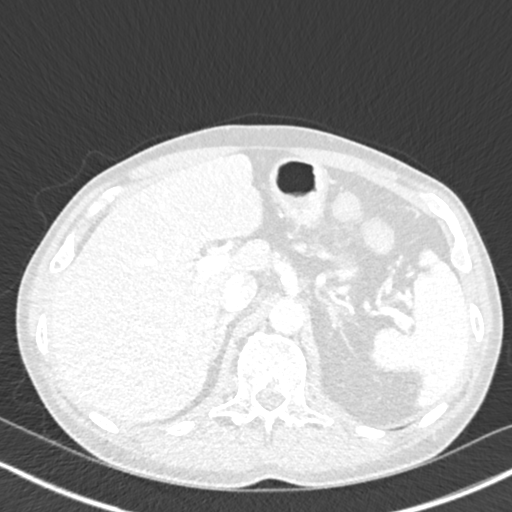
[im 25/158  lung]
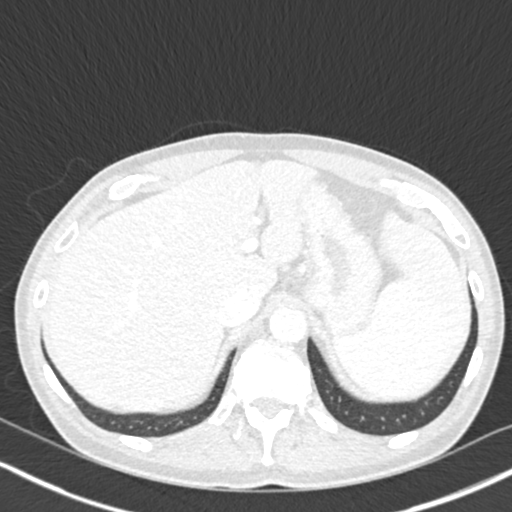
[im 37/158  lung]
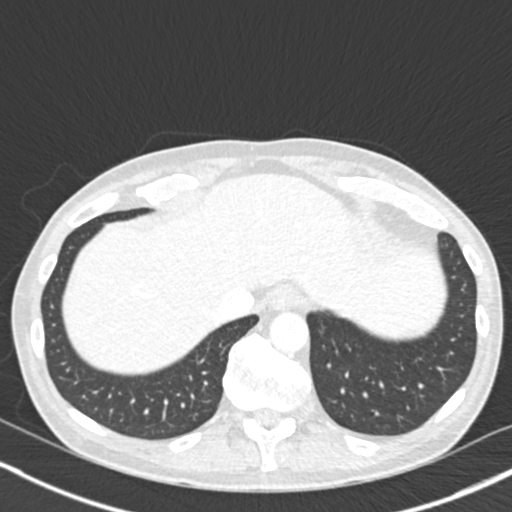
[im 49/158  lung]
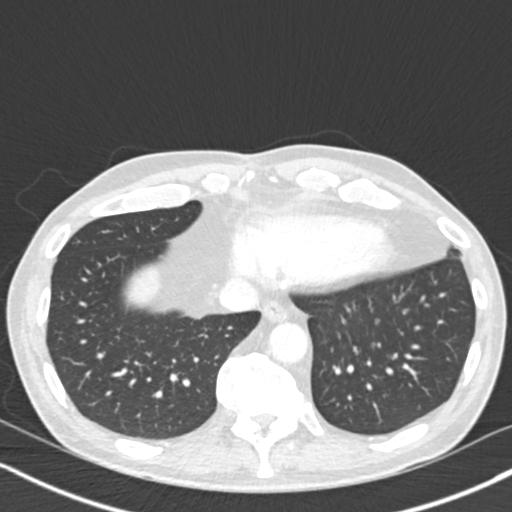
[im 61/158  mediastinal]
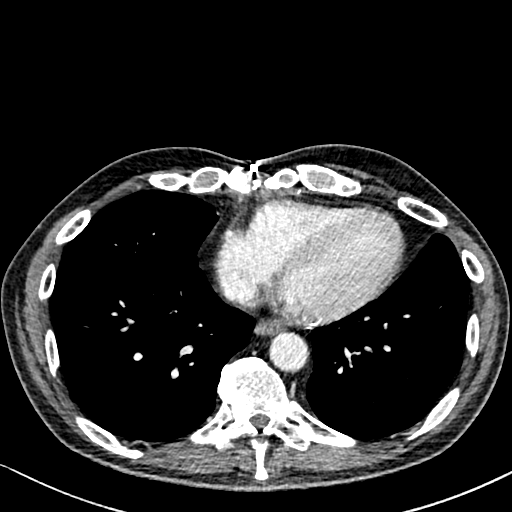
[im 61/158  lung]
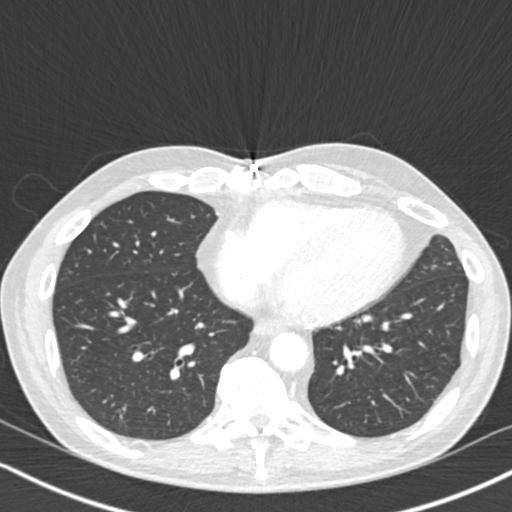
[im 73/158  lung]
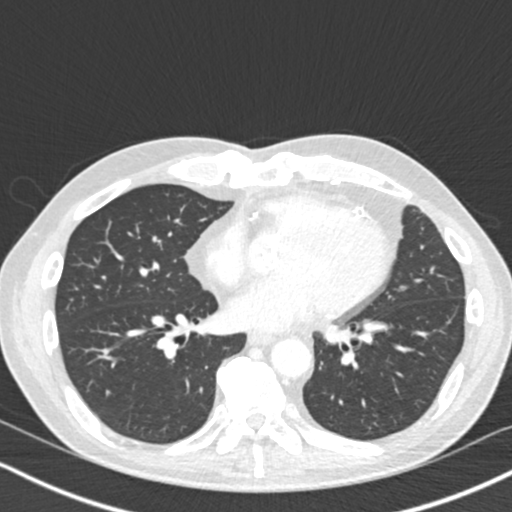
[im 85/158  lung]
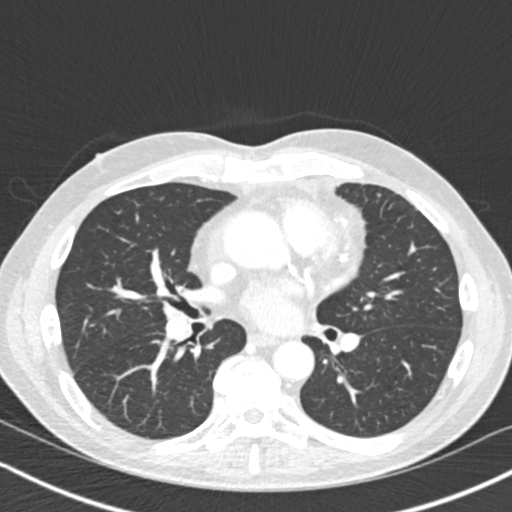
[im 97/158  lung]
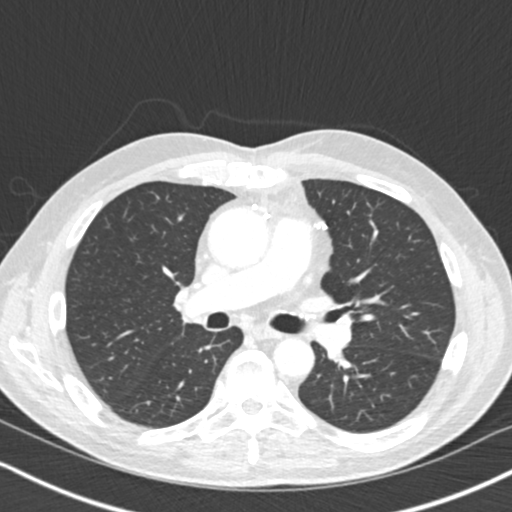
[im 109/158  mediastinal]
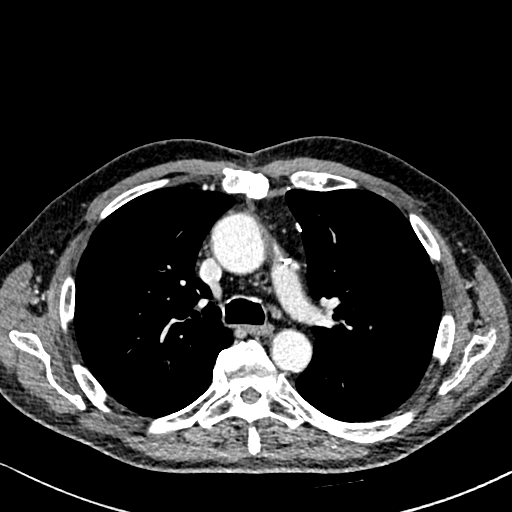
[im 109/158  lung]
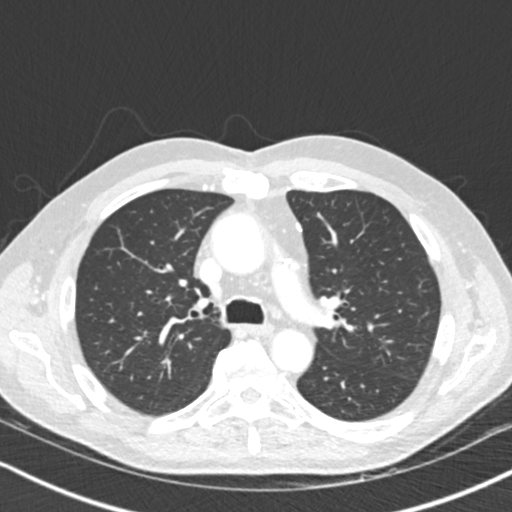
[im 121/158  lung]
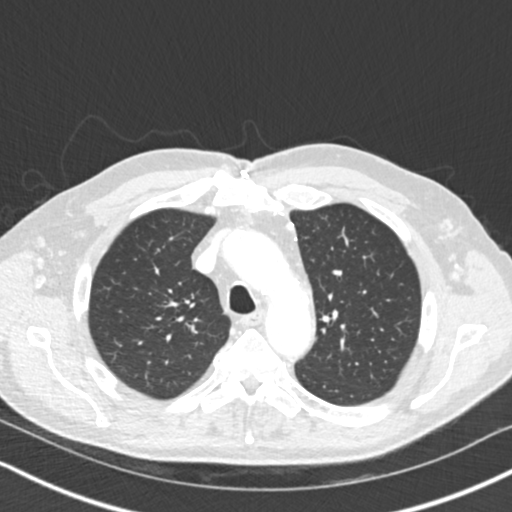
[im 133/158  lung]
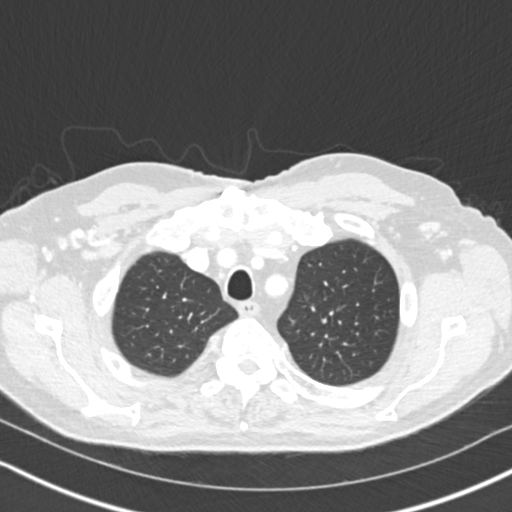
[im 145/158  lung]
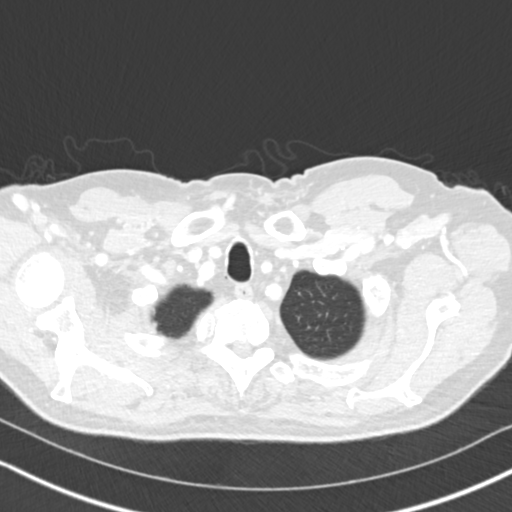

[Series 4: chest 2.00 br40 s3 · coronal · 0.62mm/px · 3 of 167 slices shown (2 of 2)]
[im 34/167  lung]
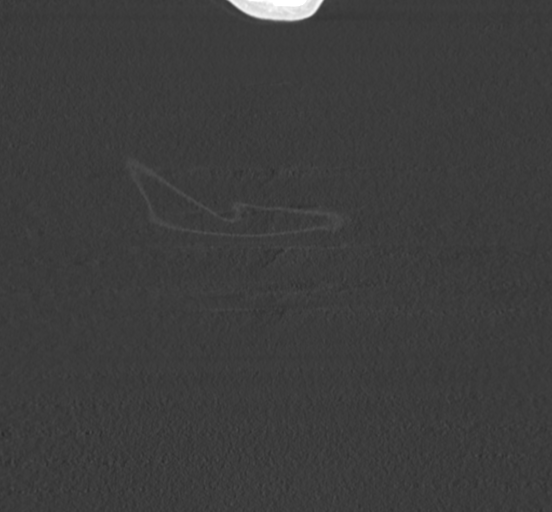
[im 67/167  lung]
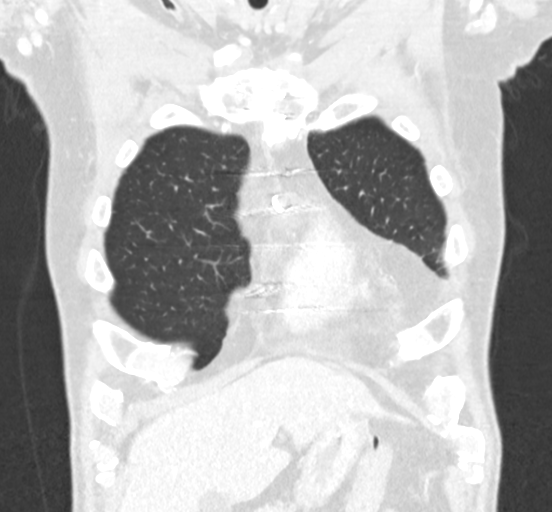
[im 100/167  lung]
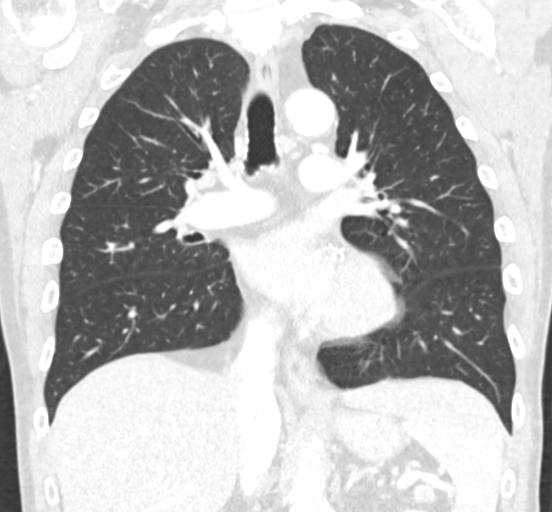

[15 of 36 positions shown; findings below may reference images not displayed]

FINDINGS: Cardiovascular: Normal heart size. No pericardial effusion. Interval
median sternotomy for CABG. The [REDACTED] and saphenous grafts are
enhancing where large enough to be visualized by non gated
technique. Aortic valvular calcification. Extensive coronary
calcification. The ascending aorta measures up to 4.1 cm, unchanged.
No dilatation of the arch or descending segments. Extensive
atheromatous plaque along the great vessel ostia.

Mediastinum/Nodes: Post surgical fat reticulation without
significant fluid collection deep to the sternotomy.

Lungs/Pleura: The sub solid/ground-glass opacities in the left lower
lobe have resolved and were inflammatory. Nodule along the right
minor fissure which is flat and consistent with lymph node. No
worrisome pulmonary nodule. No edema or effusion.

Upper Abdomen: Negative

Musculoskeletal: Sternotomy wires are intact. Spondylosis with
multi-level bridging.
IMPRESSION: 1. Resolved sub solid nodules in the left lower lobe. No suspicious
pulmonary nodule.
2. Unchanged dilatation of the ascending aorta measuring up to
cm. Recommend annual imaging followup by CTA or MRA. This
recommendation follows [0C]
ACCF/AHA/AATS/ACR/ASA/SCA/BRITTO/BRITTO/BRITTO/BRITTO Guidelines for the
Diagnosis and Management of Patients with Thoracic Aortic Disease.
Circulation. [0C]; 121: [id number]. Aortic aneurysm NOS ([0C]-[0C])
3. No adverse features after interval CABG.

## 2020-08-17 MED ORDER — IOPAMIDOL (ISOVUE-300) INJECTION 61%
75.0000 mL | Freq: Once | INTRAVENOUS | Status: AC | PRN
  Administered 2020-08-17: 75 mL via INTRAVENOUS

## 2020-08-17 NOTE — Progress Notes (Signed)
[address]       [place] [address]             [phone]       Mr. Philip Winters comes in for another follow-up appointment.  He has some tenderness at the midportion of his incision.  This was the area that healed by secondary intent.  There is a small indentation that is completely epithelialized.  This is the area of his pain.  There is no drainage or redness.  He is also met with his neurosurgeon and underwent an MRI of the neck as well as the shoulder.  He currently is in physical therapy and has difficulty raising his right arm.  I instructed him to continue his follow-up appointment with his neurosurgeon, and speak to his orthopedic surgeon who was planning on performing his knee replacement in regards to his shoulder MRI.  He will follow-up with me as needed.  Continue cardiac rehab.

## 2020-08-20 ENCOUNTER — Encounter (HOSPITAL_COMMUNITY)
Admission: RE | Admit: 2020-08-20 | Discharge: 2020-08-20 | Disposition: A | Discharge status: Home / Self Care | Payer: BC Managed Care – PPO | Source: Ambulatory Visit | Attending: Cardiology | Admitting: Cardiology

## 2020-08-20 ENCOUNTER — Other Ambulatory Visit: Payer: Self-pay

## 2020-08-20 DIAGNOSIS — Z951 Presence of aortocoronary bypass graft: Secondary | ICD-10-CM | POA: Insufficient documentation

## 2020-08-22 ENCOUNTER — Encounter (HOSPITAL_COMMUNITY): Payer: BC Managed Care – PPO

## 2020-08-23 ENCOUNTER — Ambulatory Visit (INDEPENDENT_AMBULATORY_CARE_PROVIDER_SITE_OTHER): Payer: BC Managed Care – PPO | Admitting: Orthopaedic Surgery

## 2020-08-23 ENCOUNTER — Encounter: Payer: Self-pay | Admitting: Orthopaedic Surgery

## 2020-08-23 ENCOUNTER — Other Ambulatory Visit: Payer: Self-pay

## 2020-08-23 VITALS — Ht 66.0 in | Wt 146.0 lb

## 2020-08-23 DIAGNOSIS — G8929 Other chronic pain: Secondary | ICD-10-CM | POA: Diagnosis not present

## 2020-08-23 DIAGNOSIS — M25512 Pain in left shoulder: Secondary | ICD-10-CM

## 2020-08-23 DIAGNOSIS — M79601 Pain in right arm: Secondary | ICD-10-CM | POA: Insufficient documentation

## 2020-08-23 DIAGNOSIS — G5622 Lesion of ulnar nerve, left upper limb: Secondary | ICD-10-CM | POA: Diagnosis not present

## 2020-08-23 DIAGNOSIS — G5623 Lesion of ulnar nerve, bilateral upper limbs: Secondary | ICD-10-CM | POA: Diagnosis not present

## 2020-08-23 DIAGNOSIS — G5603 Carpal tunnel syndrome, bilateral upper limbs: Secondary | ICD-10-CM | POA: Diagnosis not present

## 2020-08-23 MED ORDER — LIDOCAINE HCL 1 % IJ SOLN
3.0000 mL | INTRAMUSCULAR | Status: AC | PRN
  Administered 2020-08-23: 3 mL

## 2020-08-23 MED ORDER — BUPIVACAINE HCL 0.5 % IJ SOLN
3.0000 mL | INTRAMUSCULAR | Status: AC | PRN
  Administered 2020-08-23: 3 mL via INTRA_ARTICULAR

## 2020-08-23 MED ORDER — METHYLPREDNISOLONE ACETATE 40 MG/ML IJ SUSP
40.0000 mg | INTRAMUSCULAR | Status: AC | PRN
  Administered 2020-08-23: 40 mg via INTRA_ARTICULAR

## 2020-08-23 NOTE — Progress Notes (Signed)
Office Visit Note   Patient: Philip Winters           Date of Birth: 10/02/1960           MRN: [id number] Visit Date: 08/23/2020              Requested by: Antony Contras, MD [address],  [place] [id number] PCP: Antony Contras, MD   Assessment & Plan: Visit Diagnoses:  1. Chronic left shoulder pain   2. Right arm pain   3. Cubital tunnel syndrome on left     Plan: In terms of the left shoulder I reviewed the MRI which shows subacromial bursitis and rotator cuff tendinopathy.  He does have some findings consistent with adhesive capsulitis.  Subacromial injection provided today.  I would like to see him back in a couple weeks if he does not feel any improvement from the shot.  May need to consider glenohumeral injection.  He will keep his appointment for the EMGs to look at cubital tunnel syndrome and he will fax over the results for [name] to review.  In terms of the right hand like to get a Doppler to rule out DVT as well as obtain arthritis panel to look for inflammatory arthritis.  I will be in touch with the patient regarding the results of the lab work.  Follow-Up Instructions: Return if symptoms worsen or fail to improve.   Orders:  Orders Placed This Encounter  Procedures  . Uric acid  . Sedimentation rate  . ANA  . Rheumatoid Factor  . VAS [name] UPPER EXTREMITY VENOUS DUPLEX   No orders of the defined types were placed in this encounter.     Procedures: Large Joint Inj: L subacromial bursa on 08/23/2020 11:03 AM Indications: pain Details: 22 G needle  Arthrogram: No  Medications: 3 mL lidocaine 1 %; 3 mL bupivacaine 0.5 %; 40 mg methylPREDNISolone acetate 40 MG/ML Outcome: tolerated well, no immediate complications Patient was prepped and draped in the usual sterile fashion.       Clinical Data: No additional findings.   Subjective: Chief Complaint  Patient presents with  . Left Shoulder - Pain    Philip Winters is a very pleasant 60 year old  gentleman longtime patient of Dr. Durward Winters who comes in for evaluation of 3 separate issues.  The first 1 is chronic left shoulder pain which she recently had a shoulder MRI which shows subacromial bursitis and rotator cuff tendinopathy.  No structural abnormalities otherwise.  He has pain with shoulder abduction and reaching up.  He is currently recovering from open heart surgery in December.  He has constant pain throughout his shoulder.  He also has numbness and tingling in his small finger and the ulnar side of his left ring finger.  He has a EMG scheduled for this afternoon and has CNSA with Dr. Vertell Winters.  He states that he will wakes up with his hand and has decreased sensation in the ulnar nerve distribution.  His right hand is also very swollen and he has had trouble with this since the open heart surgery.  He cannot close his hand to make a fist.  Denies any injuries.   Review of Systems  Constitutional: Negative.   All other systems reviewed and are negative.    Objective: Vital Signs: Ht 5\' 6"  (1.676 m)   Wt 146 lb (66.2 kg)   BMI 23.57 kg/m   Physical Exam Vitals and nursing note reviewed.  Constitutional:  Appearance: He is well-developed.  Pulmonary:     Effort: Pulmonary effort is normal.  Abdominal:     Palpations: Abdomen is soft.  Skin:    General: Skin is warm.  Neurological:     Mental Status: He is alert and oriented to person, place, and time.  Psychiatric:        Behavior: Behavior normal.        Thought Content: Thought content normal.        Judgment: Judgment normal.     Ortho Exam Left shoulder exam shows moderate pain with abduction to 90 degrees.  Empty can testing shows decent strength with moderate pain.  Range of motion slightly decreased secondary to pain discomfort.  Left elbow shows positive Tinel at the cubital tunnel.  He has decreased sensation of the small finger and ulnar side of the ring finger.  Mild intrinsic atrophy of the  hand.  Right hand shows moderate swelling.  He is unable to make a full composite fist secondary to diffuse swelling.  No evidence of infection.  No neurovascular compromise. Specialty Comments:  No specialty comments available.  Imaging: No results found.   PMFS History: Patient Active Problem List   Diagnosis Date Noted  . Chronic left shoulder pain 08/23/2020  . Right arm pain 08/23/2020  . Cubital tunnel syndrome on left 08/23/2020  . Left ankle sprain 07/12/2020  . Visit for wound check 06/11/2020  . Change or removal of surgical wound dressing 05/24/2020  . Pulmonary nodule, left 05/23/2020  . Cellulitis 05/23/2020  . S/P CABG x 2 05/02/2020  . Coronary artery disease 05/01/2020  . Pulmonary nodule 1 cm or greater in diameter 04/26/2020  . Statin intolerance 04/20/2020  . Coronary atherosclerosis of native coronary artery 04/18/2020  . Abnormal cardiovascular stress test   . Nonrheumatic aortic valve stenosis   . Exertional dyspnea   . Stable angina pectoris ([place])   . Bicuspid aortic valve 04/06/2020  . Primary osteoarthritis of left knee 11/08/2019  . Bilateral primary osteoarthritis of knee 09/28/2019  . HNP (herniated nucleus pulposus), lumbar 09/29/2017  . Right carotid bruit 06/10/2017  . Paresthesia 06/10/2017  . Left wrist pain 10/09/2016  . Unilateral primary osteoarthritis, right knee 06/24/2016  . S/P total knee replacement using cement, right 06/24/2016  . Acute pain of left knee 03/27/2016  . Mixed hyperlipidemia 01/18/2007  . HYPERTENSION 01/18/2007   Past Medical History:  Diagnosis Date  . Allergy   . Arthritis   . Coronary artery disease   . GERD (gastroesophageal reflux disease)    occ  . Heart murmur    baby  . High cholesterol   . Hypertension   . Pneumonia   . Right carotid bruit 06/10/2017  . Sciatica   . Seasonal allergies   . Sinus tachycardia   . Spinal stenosis     History reviewed. No pertinent family history.  Past Surgical  History:  Procedure Laterality Date  . CARDIAC CATHETERIZATION    . CARPAL TUNNEL WITH CUBITAL TUNNEL Left 11/10/2013   Procedure: LEFT CARPAL TUNNEL RELEASE;  Surgeon: Cammie Sickle, MD;  Location: [facility];  Service: Orthopedics;  Laterality: Left;  . COLONOSCOPY    . CORONARY ARTERY BYPASS GRAFT N/A 05/01/2020   Procedure: CORONARY ARTERY BYPASS GRAFTING (CABG) X 2 ON CARDIOPULMONARY BYPASS. LIMA TO LAD, SVG TO DIAG;  Surgeon: Ivin Poot, MD;  Location: [facility];  Service: Open Heart Surgery;  Laterality: N/A;  . ENDOVEIN  HARVEST OF GREATER SAPHENOUS VEIN Right 05/01/2020   Procedure: ENDOVEIN HARVEST OF GREATER SAPHENOUS VEIN;  Surgeon: Ivin Poot, MD;  Location: [facility];  Service: Open Heart Surgery;  Laterality: Right;  . KNEE ARTHROSCOPY  [id number]   left and right  . LUMBAR LAMINECTOMY/DECOMPRESSION MICRODISCECTOMY Left 09/29/2017   Procedure: LEFT LUMBAR TWO - LUMBAR THREELAMINOTOMY/MICRODISCECTOMY;  Surgeon: Jovita Gamma, MD;  Location: [facility];  Service: Neurosurgery;  Laterality: Left;  LEFT LUMBAR 2- LUMBAR 3 LAMINOTOMY/MICRODISCECTOMY  . NASAL SINUS SURGERY  05/2015  . RIGHT/LEFT HEART CATH AND CORONARY ANGIOGRAPHY N/A 04/10/2020   Procedure: RIGHT/LEFT HEART CATH AND CORONARY ANGIOGRAPHY;  Surgeon: Troy Sine, MD;  Location: [facility];  Service: Cardiovascular;  Laterality: N/A;  . TEE WITHOUT CARDIOVERSION N/A 05/01/2020   Procedure: TRANSESOPHAGEAL ECHOCARDIOGRAM (TEE);  Surgeon: Prescott Gum, Collier Salina, MD;  Location: [facility];  Service: Open Heart Surgery;  Laterality: N/A;  . TOTAL KNEE ARTHROPLASTY Right 06/24/2016   Procedure: TOTAL KNEE ARTHROPLASTY;  Surgeon: Garald Balding, MD;  Location: [facility];  Service: Orthopedics;  Laterality: Right;  . TRIGGER FINGER RELEASE Left 11/10/2013   Procedure: LEFT INDEX A-1 PULLEY RELEASE;  Surgeon: Cammie Sickle, MD;  Location: [facility];  Service: Orthopedics;  Laterality: Left;   . ULNAR NERVE TRANSPOSITION Left 11/10/2013   Procedure: ULNAR NERVE TRANSPOSITION;  Surgeon: Cammie Sickle, MD;  Location: [facility];  Service: Orthopedics;  Laterality: Left;   Social History   Occupational History  . Occupation: Self employed  Tobacco Use  . Smoking status: Former Smoker    Packs/day: 1.00    Years: 37.00    Pack years: 37.00    Types: Cigarettes    Quit date: 05/04/2016    Years since quitting: 4.3  . Smokeless tobacco: Never Used  Vaping Use  . Vaping Use: Former  Substance and Sexual Activity  . Alcohol use: Yes    Alcohol/week: 3.0 standard drinks    Types: 3 Glasses of wine per week    Comment: weekends only with dinner  . Drug use: No  . Sexual activity: Not on file

## 2020-08-24 ENCOUNTER — Encounter (HOSPITAL_COMMUNITY): Payer: BC Managed Care – PPO

## 2020-08-25 LAB — ANA: Anti Nuclear Antibody (ANA): NEGATIVE

## 2020-08-25 LAB — URIC ACID: Uric Acid, Serum: 3.7 mg/dL — ABNORMAL LOW (ref 4.0–8.0)

## 2020-08-25 LAB — SEDIMENTATION RATE: Sed Rate: 80 mm/h — ABNORMAL HIGH (ref 0–20)

## 2020-08-25 LAB — RHEUMATOID FACTOR: Rheumatoid fact SerPl-aCnc: <14 IU/mL (ref <14)

## 2020-08-27 ENCOUNTER — Other Ambulatory Visit: Payer: Self-pay

## 2020-08-27 ENCOUNTER — Encounter (HOSPITAL_COMMUNITY)
Admission: RE | Admit: 2020-08-27 | Discharge: 2020-08-27 | Disposition: A | Discharge status: Home / Self Care | Payer: BC Managed Care – PPO | Source: Ambulatory Visit | Attending: Cardiology | Admitting: Cardiology

## 2020-08-27 DIAGNOSIS — M79641 Pain in right hand: Secondary | ICD-10-CM

## 2020-08-27 DIAGNOSIS — Z951 Presence of aortocoronary bypass graft: Secondary | ICD-10-CM | POA: Diagnosis not present

## 2020-08-27 NOTE — Progress Notes (Signed)
He has not heard about doppler yet.  Please refer to rheumatology about right hand pain and swelling. Thank you.

## 2020-08-29 ENCOUNTER — Encounter (HOSPITAL_COMMUNITY)
Admission: RE | Admit: 2020-08-29 | Discharge: 2020-08-29 | Disposition: A | Discharge status: Home / Self Care | Payer: BC Managed Care – PPO | Source: Ambulatory Visit | Attending: Cardiology | Admitting: Cardiology

## 2020-08-29 ENCOUNTER — Ambulatory Visit (HOSPITAL_COMMUNITY)
Admission: RE | Admit: 2020-08-29 | Discharge: 2020-08-29 | Disposition: A | Discharge status: Home / Self Care | Payer: BC Managed Care – PPO | Source: Ambulatory Visit | Attending: Orthopaedic Surgery | Admitting: Orthopaedic Surgery

## 2020-08-29 ENCOUNTER — Telehealth: Payer: Self-pay | Admitting: Cardiology

## 2020-08-29 ENCOUNTER — Other Ambulatory Visit: Payer: Self-pay

## 2020-08-29 DIAGNOSIS — G8929 Other chronic pain: Secondary | ICD-10-CM | POA: Diagnosis not present

## 2020-08-29 DIAGNOSIS — M79601 Pain in right arm: Secondary | ICD-10-CM | POA: Diagnosis not present

## 2020-08-29 DIAGNOSIS — Z951 Presence of aortocoronary bypass graft: Secondary | ICD-10-CM | POA: Diagnosis not present

## 2020-08-29 DIAGNOSIS — M25512 Pain in left shoulder: Secondary | ICD-10-CM

## 2020-08-29 NOTE — Telephone Encounter (Signed)
Surgery perfomed by Dr Prescott Gum (cardiothoracic) in December, also f/u with orthopedic doctor for hand and shoulder pain.  Should contact any of above providers for further pain management recommendations.  Noted he has diclofenac gel, tramadol and gabapentin on file for pain management already.

## 2020-08-29 NOTE — Progress Notes (Signed)
Philip Winters 60 y.o. male Nutrition Note  Diagnosis: CABGx2  Past Medical History:  Diagnosis Date  . Allergy   . Arthritis   . Coronary artery disease   . GERD (gastroesophageal reflux disease)    occ  . Heart murmur    baby  . High cholesterol   . Hypertension   . Pneumonia   . Right carotid bruit 06/10/2017  . Sciatica   . Seasonal allergies   . Sinus tachycardia   . Spinal stenosis      Medications reviewed.   Current Outpatient Medications:  .  ALPRAZolam (XANAX) 0.5 MG tablet, Take 1 tablet (0.5 mg total) by mouth daily as needed for anxiety., Disp: 10 tablet, Rfl: 0 .  aspirin EC 81 MG tablet, Take 1 tablet (81 mg total) by mouth daily. Swallow whole., Disp: 90 tablet, Rfl: 3 .  diclofenac Sodium (VOLTAREN) 1 % GEL, Apply 2 g topically daily as needed (Knee and back pain)., Disp: , Rfl:  .  diltiazem (CARDIZEM CD) 240 MG 24 hr capsule, Take 1 capsule (240 mg total) by mouth daily., Disp: 90 capsule, Rfl: 3 .  diphenhydrAMINE (BENADRYL) 25 MG tablet, Take 12.5 mg by mouth at bedtime., Disp: , Rfl:  .  Evolocumab (REPATHA SURECLICK) 350 MG/ML SOAJ, Inject 1 pen into the skin every 14 (fourteen) days., Disp: 2 mL, Rfl: 11 .  ferrous [id number] C-folic acid (TRINSICON / FOLTRIN) capsule, Take 1 capsule by mouth 2 (two) times daily after a meal., Disp: 60 capsule, Rfl: 1 .  fluticasone (FLONASE) 50 MCG/ACT nasal spray, Place 2 sprays into both nostrils daily as needed for allergies. , Disp: , Rfl:  .  gabapentin (NEURONTIN) 300 MG capsule, Take 300 mg by mouth daily., Disp: , Rfl:  .  Multiple Vitamin (MULTIVITAMIN WITH MINERALS) TABS tablet, Take 1 tablet by mouth daily., Disp: , Rfl:  .  naproxen sodium (ALEVE) 220 MG tablet, Take 220 mg by mouth daily as needed., Disp: , Rfl:  .  predniSONE (DELTASONE) 50 MG tablet, Pt to take 50 mg of prednisone on 08/16/20 at 10:00 PM , 50 mg of prednisone on 08/17/20 at 4:00 AM, and 50 mg of prednisone on 08/17/20 at 10:00 AM. Pt is  also to take 50 mg of benadryl on 08/17/20 at 10:00 AM. Please call [phone] with any questions., Disp: 3 tablet, Rfl: 0 .  tetrahydrozoline 0.05 % ophthalmic solution, Place 1 drop into both eyes 3 (three) times daily as needed (for dry/irritated eyes.)., Disp: , Rfl:  .  traMADol (ULTRAM) 50 MG tablet, Take 50 mg by mouth every 6 (six) hours as needed., Disp: , Rfl:  .  Zinc 50 MG TABS, Take 50 mg by mouth daily., Disp: , Rfl:    Ht Readings from Last 1 Encounters:  08/23/20 5\' 6"  (1.676 m)     Wt Readings from Last 3 Encounters:  08/23/20 146 lb (66.2 kg)  08/17/20 146 lb (66.2 kg)  08/09/20 142 lb 13.7 oz (64.8 kg)     There is no height or weight on file to calculate BMI.   Social History   Tobacco Use  Smoking Status Former Smoker  . Packs/day: 1.00  . Years: 37.00  . Pack years: 37.00  . Types: Cigarettes  . Quit date: 05/04/2016  . Years since quitting: 4.3  Smokeless Tobacco Never Used     Lab Results  Component Value Date   CHOL 165 07/12/2020   Lab Results  Component Value Date  HDL 38 (L) 07/12/2020   Lab Results  Component Value Date   LDLCALC 113 (H) 07/12/2020   Lab Results  Component Value Date   TRIG 70 07/12/2020     Lab Results  Component Value Date   HGBA1C 5.8 (H) 04/30/2020     CBG (last 3)  No results for input(s): GLUCAP in the last 72 hours.   Nutrition Note  Spoke with pt. Nutrition Plan and Nutrition Survey goals reviewed with pt. Pt has been trying to follow a heart healthy diet.  He lost 20 lbs after surgery. He has gained 5 lbs back. He is continuing to work on gaining 15 lbs.   Pt has Pre-diabetes. Last A1c indicates blood glucose well-controlled.   Per discussion, pt does not use canned/convenience foods often. Pt does add salt to food. Pt does not eat out frequently. Reviewed sodium recommendations of 1500-2300 mg/day. Discussed use of salt shaker. Noted LDL 113 mg/dl and HDL 38 mg/dl. Discussed lifestyle changes  for lipid management. He does not eat many whole grains. He does try to incorporate veggies and fruit each day. He does not like fish. Discussed healthy fats for lipid management and weight gain. Provided suggestions for ways to increase fiber.  He walks daily on his own. He has tried to live a healthy lifestyle. He is open to making changes.   Pt expressed understanding of the information reviewed.    Nutrition Diagnosis ? Inadequate fiber intake related to lack of previous education about dietary fiber as evidenced by pt diet recall and report of <1 serving whole grains/1 serving veggies daily and LDL 113 mg/dl   Nutrition Intervention ? Pt's individual nutrition plan reviewed with pt. ? Benefits of adopting Heart Healthy diet discussed when Picture Your Plate reviewed.   ? Pt given handouts for: ? Label reading  ? Continue client-centered nutrition education by RD, as part of interdisciplinary care.  Goal(s) ? Pt to incorporate beans, nuts, whole grains, fruits, and veggies to reach goal of 30 g fiber daily ? Pt to reduce saturated fat intake to <15 g by reading labels ? Pt to incorporate a healthy fat into each meal to reach weight gain goal of 155 lbs  Plan:   Will provide client-centered nutrition education as part of interdisciplinary care  Monitor and evaluate progress toward nutrition goal with team.   Michaele Offer, MS, RDN, LDN

## 2020-08-29 NOTE — Telephone Encounter (Signed)
Verdis Frederickson is asking that someone called her back bout the patient. Patient had surgery and have problems with his right shoulder and hand. Patient was told to not take traMADol (ULTRAM) 50 MG tablet and  aspirin EC 81 MG tablet but patient is in pain. If patient is not allow to take it she is asking what do you recommend for the patient to take. Please advise [phone]

## 2020-08-29 NOTE — Progress Notes (Addendum)
Patient's resting heart rate remains in the low 100's requiring Mukund to sit down and rest before being able to exercise. Lowest heart noted today post exercise today at cardiac rehab was 107. The finger tips of Naftula's right hand were noted to be swollen this afternoon. This was noted after the patient had already exercised. Rachard says that he had an ultrasound completed of his right hand today. Volney says he was told by his orthopedist that he  cant take tramadol or alleve for pain due to his open heart surgery. Will reach out to Dr Kingsley Plan for clarification on the patient's behalf. Will also send the exercise low sheets from cardiac rehab for Dr Kingsley Plan to review as Brittan remains in Sinus Tach during exercise. Jakhi's heart rate has ranged from both resting and with exercise from 100-140. Medications reviewed. Leul is taking his diltiazem as prescribed.Barnet Pall, RN,BSN 08/29/2020 3:31 PM

## 2020-08-30 ENCOUNTER — Encounter: Payer: Self-pay | Admitting: Orthopaedic Surgery

## 2020-08-31 ENCOUNTER — Encounter (HOSPITAL_COMMUNITY): Payer: BC Managed Care – PPO

## 2020-08-31 NOTE — Telephone Encounter (Signed)
Called patient in regards to medication call.  He reports that his concern was not with tramadol it was aleve.  He reports that he takes aleve for pain but was advised by another provider that he should not take this medication since the had heart surgery.  I advised him to call CTS office to get clarification on medications.  He expressed that he would call.

## 2020-09-03 ENCOUNTER — Other Ambulatory Visit: Payer: Self-pay

## 2020-09-03 ENCOUNTER — Encounter (HOSPITAL_COMMUNITY)
Admission: RE | Admit: 2020-09-03 | Discharge: 2020-09-03 | Disposition: A | Discharge status: Home / Self Care | Payer: BC Managed Care – PPO | Source: Ambulatory Visit | Attending: Cardiology | Admitting: Cardiology

## 2020-09-03 DIAGNOSIS — Z951 Presence of aortocoronary bypass graft: Secondary | ICD-10-CM

## 2020-09-03 NOTE — Progress Notes (Signed)
Incomplete Session Note  Patient Details  Name: Philip Winters MRN: [id number] Date of Birth: [birth year] Referring Provider:   Flowsheet Row CARDIAC REHAB PHASE II ORIENTATION from 08/09/2020 in [facility]  Referring Provider Candee Furbish, MD      Philip Winters did not complete his rehab session.  Philip Winters says he right hand and left knee are swollen and he will not be able to exercise today. Gerrald says he is still waiting to hear if he can take tramadol and alleve for joint and knee pain.Barnet Pall, RN,BSN 09/03/2020 2:05 PM

## 2020-09-04 DIAGNOSIS — Z0000 Encounter for general adult medical examination without abnormal findings: Secondary | ICD-10-CM | POA: Diagnosis not present

## 2020-09-04 DIAGNOSIS — Z125 Encounter for screening for malignant neoplasm of prostate: Secondary | ICD-10-CM | POA: Diagnosis not present

## 2020-09-04 DIAGNOSIS — D649 Anemia, unspecified: Secondary | ICD-10-CM | POA: Diagnosis not present

## 2020-09-04 DIAGNOSIS — R748 Abnormal levels of other serum enzymes: Secondary | ICD-10-CM | POA: Diagnosis not present

## 2020-09-04 NOTE — Telephone Encounter (Signed)
We can go ahead and order an MRI of the right shoulder to rule out rotator cuff tear.

## 2020-09-05 ENCOUNTER — Encounter (HOSPITAL_COMMUNITY): Payer: BC Managed Care – PPO

## 2020-09-05 ENCOUNTER — Telehealth: Payer: Self-pay | Admitting: Orthopaedic Surgery

## 2020-09-05 ENCOUNTER — Other Ambulatory Visit: Payer: Self-pay

## 2020-09-05 DIAGNOSIS — M79601 Pain in right arm: Secondary | ICD-10-CM

## 2020-09-05 NOTE — Telephone Encounter (Signed)
Pt made appt

## 2020-09-06 ENCOUNTER — Other Ambulatory Visit: Payer: Self-pay

## 2020-09-06 ENCOUNTER — Encounter: Payer: Self-pay | Admitting: Orthopaedic Surgery

## 2020-09-06 ENCOUNTER — Telehealth: Payer: Self-pay | Admitting: Orthopaedic Surgery

## 2020-09-06 ENCOUNTER — Ambulatory Visit: Payer: BC Managed Care – PPO | Admitting: Orthopaedic Surgery

## 2020-09-06 VITALS — Ht 66.0 in | Wt 146.0 lb

## 2020-09-06 DIAGNOSIS — M170 Bilateral primary osteoarthritis of knee: Secondary | ICD-10-CM | POA: Diagnosis not present

## 2020-09-06 DIAGNOSIS — M1712 Unilateral primary osteoarthritis, left knee: Secondary | ICD-10-CM | POA: Diagnosis not present

## 2020-09-06 MED ORDER — METHYLPREDNISOLONE ACETATE 40 MG/ML IJ SUSP
80.0000 mg | INTRAMUSCULAR | Status: AC | PRN
  Administered 2020-09-06: 80 mg via INTRA_ARTICULAR

## 2020-09-06 MED ORDER — LIDOCAINE HCL 1 % IJ SOLN
2.0000 mL | INTRAMUSCULAR | Status: AC | PRN
  Administered 2020-09-06: 2 mL

## 2020-09-06 MED ORDER — BUPIVACAINE HCL 0.25 % IJ SOLN
2.0000 mL | INTRAMUSCULAR | Status: AC | PRN
  Administered 2020-09-06: 2 mL via INTRA_ARTICULAR

## 2020-09-06 NOTE — Telephone Encounter (Signed)
Scheduled for today.

## 2020-09-06 NOTE — Telephone Encounter (Signed)
Patient called requesting a call back. Patient states he can barely walk due to fluid on the  Left knee. He is asking if they can open a slot or see another doctor to drain his knee today. Patient is in severe pain and stated Dr. Durward Fortes stated he will have knee surgery but not at the moment. Please call patient at [phone].

## 2020-09-06 NOTE — Progress Notes (Signed)
Office Visit Note   Patient: Philip Winters           Date of Birth: Apr 12, 1961           MRN: [id number] Visit Date: 09/06/2020              Requested by: Antony Contras, MD [place],  [place] [address] PCP: Antony Contras, MD   Assessment & Plan: Visit Diagnoses:  1. Bilateral primary osteoarthritis of knee   2. Primary osteoarthritis of left knee     Plan: Philip Winters has recurrent effusion of his left knee.  He has history of osteoarthritis and CPPD.  I aspirated 75 cc of somewhat cloudy fluid consistent with CPPD and injected Depo-Medrol.  He felt much better.  We will plan to see him back as needed  Follow-Up Instructions: Return if symptoms worsen or fail to improve.   Orders:  Orders Placed This Encounter  Procedures  . Large Joint Inj: L knee   No orders of the defined types were placed in this encounter.     Procedures: Large Joint Inj: L knee on 09/06/2020 1:44 PM Indications: pain and diagnostic evaluation Details: 25 G 1.5 in needle, anteromedial approach  Arthrogram: No  Medications: 2 mL lidocaine 1 %; 80 mg methylPREDNISolone acetate 40 MG/ML; 2 mL bupivacaine 0.25 % Aspirate: 75 mL cloudy and yellow Outcome: tolerated well, no immediate complications Procedure, treatment alternatives, risks and benefits explained, specific risks discussed. Consent was given by the patient. Patient was prepped and draped in the usual sterile fashion.       Clinical Data: No additional findings.   Subjective: Chief Complaint  Patient presents with  . Left Knee - Follow-up  Patient presents today for follow up on his left knee. He had a cortisone injection in his left knee on 05/29/2020. Patient states that he is currently in therapy following his cardiac surgery. He said that his left leg is swollen and painful. He cannot fully flex his leg.  HPI  Review of Systems   Objective: Vital Signs: Ht 5\' 6"  (1.676 m)   Wt 146 lb (66.2 kg)   BMI  23.57 kg/m   Physical Exam Constitutional:      Appearance: He is well-developed.  Eyes:     Pupils: Pupils are equal, round, and reactive to light.  Pulmonary:     Effort: Pulmonary effort is normal.  Skin:    General: Skin is warm and dry.  Neurological:     Mental Status: He is alert and oriented to person, place, and time.  Psychiatric:        Behavior: Behavior normal.     Ortho Exam awake alert and oriented x3.  Comfortable sitting.  Large effusion left knee with multiple areas of mild to moderate tenderness.  Has limited flexion extension related to the effusion.  After aspiration he had full extension and flexed over 100 degrees without instability and little if any pain Specialty Comments:  No specialty comments available.  Imaging: No results found.   PMFS History: Patient Active Problem List   Diagnosis Date Noted  . Chronic left shoulder pain 08/23/2020  . Right arm pain 08/23/2020  . Cubital tunnel syndrome on left 08/23/2020  . Left ankle sprain 07/12/2020  . Visit for wound check 06/11/2020  . Change or removal of surgical wound dressing 05/24/2020  . Pulmonary nodule, left 05/23/2020  . Cellulitis 05/23/2020  . S/P CABG x 2 05/02/2020  .  Coronary artery disease 05/01/2020  . Pulmonary nodule 1 cm or greater in diameter 04/26/2020  . Statin intolerance 04/20/2020  . Coronary atherosclerosis of native coronary artery 04/18/2020  . Abnormal cardiovascular stress test   . Nonrheumatic aortic valve stenosis   . Exertional dyspnea   . Stable angina pectoris ([facility])   . Bicuspid aortic valve 04/06/2020  . Primary osteoarthritis of left knee 11/08/2019  . Bilateral primary osteoarthritis of knee 09/28/2019  . HNP (herniated nucleus pulposus), lumbar 09/29/2017  . Right carotid bruit 06/10/2017  . Paresthesia 06/10/2017  . Left wrist pain 10/09/2016  . Unilateral primary osteoarthritis, right knee 06/24/2016  . S/P total knee replacement using cement, right  06/24/2016  . Acute pain of left knee 03/27/2016  . Mixed hyperlipidemia 01/18/2007  . HYPERTENSION 01/18/2007   Past Medical History:  Diagnosis Date  . Allergy   . Arthritis   . Coronary artery disease   . GERD (gastroesophageal reflux disease)    occ  . Heart murmur    baby  . High cholesterol   . Hypertension   . Pneumonia   . Right carotid bruit 06/10/2017  . Sciatica   . Seasonal allergies   . Sinus tachycardia   . Spinal stenosis     History reviewed. No pertinent family history.  Past Surgical History:  Procedure Laterality Date  . CARDIAC CATHETERIZATION    . CARPAL TUNNEL WITH CUBITAL TUNNEL Left 11/10/2013   Procedure: LEFT CARPAL TUNNEL RELEASE;  Surgeon: Cammie Sickle, MD;  Location: [facility];  Service: Orthopedics;  Laterality: Left;  . COLONOSCOPY    . CORONARY ARTERY BYPASS GRAFT N/A 05/01/2020   Procedure: CORONARY ARTERY BYPASS GRAFTING (CABG) X 2 ON CARDIOPULMONARY BYPASS. LIMA TO LAD, SVG TO DIAG;  Surgeon: Ivin Poot, MD;  Location: [facility];  Service: Open Heart Surgery;  Laterality: N/A;  . ENDOVEIN HARVEST OF GREATER SAPHENOUS VEIN Right 05/01/2020   Procedure: ENDOVEIN HARVEST OF GREATER SAPHENOUS VEIN;  Surgeon: Ivin Poot, MD;  Location: [facility];  Service: Open Heart Surgery;  Laterality: Right;  . KNEE ARTHROSCOPY  [id number]   left and right  . LUMBAR LAMINECTOMY/DECOMPRESSION MICRODISCECTOMY Left 09/29/2017   Procedure: LEFT LUMBAR TWO - LUMBAR THREELAMINOTOMY/MICRODISCECTOMY;  Surgeon: Jovita Gamma, MD;  Location: [facility];  Service: Neurosurgery;  Laterality: Left;  LEFT LUMBAR 2- LUMBAR 3 LAMINOTOMY/MICRODISCECTOMY  . NASAL SINUS SURGERY  05/2015  . RIGHT/LEFT HEART CATH AND CORONARY ANGIOGRAPHY N/A 04/10/2020   Procedure: RIGHT/LEFT HEART CATH AND CORONARY ANGIOGRAPHY;  Surgeon: Troy Sine, MD;  Location: [facility];  Service: Cardiovascular;  Laterality: N/A;  . TEE WITHOUT CARDIOVERSION N/A 05/01/2020    Procedure: TRANSESOPHAGEAL ECHOCARDIOGRAM (TEE);  Surgeon: Prescott Gum, Collier Salina, MD;  Location: [facility];  Service: Open Heart Surgery;  Laterality: N/A;  . TOTAL KNEE ARTHROPLASTY Right 06/24/2016   Procedure: TOTAL KNEE ARTHROPLASTY;  Surgeon: Garald Balding, MD;  Location: [facility];  Service: Orthopedics;  Laterality: Right;  . TRIGGER FINGER RELEASE Left 11/10/2013   Procedure: LEFT INDEX A-1 PULLEY RELEASE;  Surgeon: Cammie Sickle, MD;  Location: [facility];  Service: Orthopedics;  Laterality: Left;  . ULNAR NERVE TRANSPOSITION Left 11/10/2013   Procedure: ULNAR NERVE TRANSPOSITION;  Surgeon: Cammie Sickle, MD;  Location: [facility];  Service: Orthopedics;  Laterality: Left;   Social History   Occupational History  . Occupation: Self employed  Tobacco Use  . Smoking status:  Former Smoker    Packs/day: 1.00    Years: 37.00    Pack years: 37.00    Types: Cigarettes    Quit date: 05/04/2016    Years since quitting: 4.3  . Smokeless tobacco: Never Used  Vaping Use  . Vaping Use: Former  Substance and Sexual Activity  . Alcohol use: Yes    Alcohol/week: 3.0 standard drinks    Types: 3 Glasses of wine per week    Comment: weekends only with dinner  . Drug use: No  . Sexual activity: Not on file

## 2020-09-07 ENCOUNTER — Other Ambulatory Visit: Payer: Self-pay | Admitting: Surgical

## 2020-09-07 ENCOUNTER — Encounter (HOSPITAL_COMMUNITY): Payer: BC Managed Care – PPO

## 2020-09-07 MED ORDER — DIAZEPAM 2 MG PO TABS: ORAL_TABLET | ORAL | 0 refills | Status: DC

## 2020-09-07 NOTE — Telephone Encounter (Signed)
Sent in RX for #1 Valium. Dont take with xanax

## 2020-09-09 ENCOUNTER — Ambulatory Visit
Admission: RE | Admit: 2020-09-09 | Discharge: 2020-09-09 | Disposition: A | Discharge status: Home / Self Care | Payer: BC Managed Care – PPO | Source: Ambulatory Visit | Attending: Orthopaedic Surgery | Admitting: Orthopaedic Surgery

## 2020-09-09 ENCOUNTER — Other Ambulatory Visit: Payer: Self-pay

## 2020-09-09 DIAGNOSIS — M25511 Pain in right shoulder: Secondary | ICD-10-CM | POA: Diagnosis not present

## 2020-09-09 DIAGNOSIS — M79601 Pain in right arm: Secondary | ICD-10-CM

## 2020-09-09 IMAGING — MR MR SHOULDER*R* W/O CM
4 of 5 series · 21 of 40 positions shown · non-contrast
Comparison: None.

CLINICAL DATA: Right shoulder pain for the past 3 months. No injury
or prior surgery.

EXAM:
MRI OF THE RIGHT SHOULDER WITHOUT CONTRAST
TECHNIQUE: Multiplanar, multisequence MR imaging of the shoulder was performed.
No intravenous contrast was administered.

[Series 6: T2 fat-sat · axial · right · 3.0mm · 0.59mm/px · z∈[-38,+60]mm · 8 of 27 slices shown (1 of 3)]
[im 1/27]
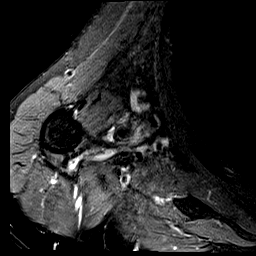
[im 3/27]
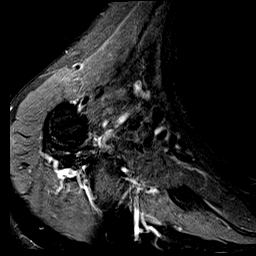
[im 9/27]
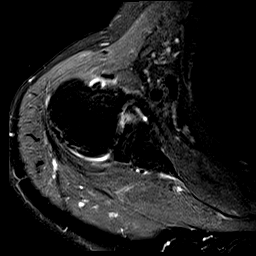
[im 12/27]
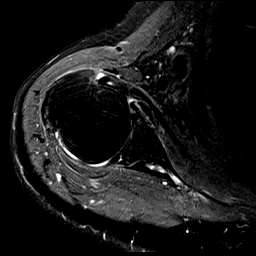
[im 15/27]
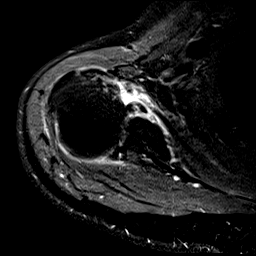
[im 18/27]
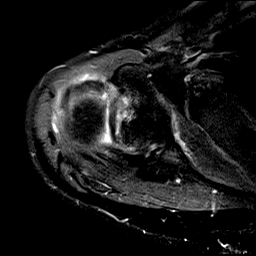
[im 24/27]
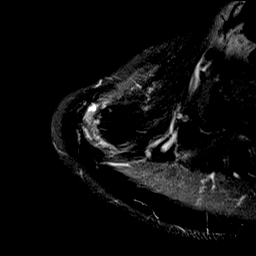
[im 27/27]
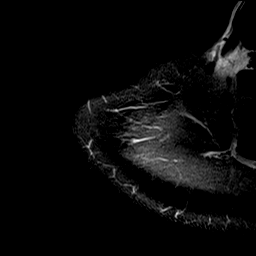

[Series 7: T2 fat-sat · oblique · right · 4.0mm · 0.22mm/px · 3 of 21 slices shown (2 of 3)]
[im 4/21]
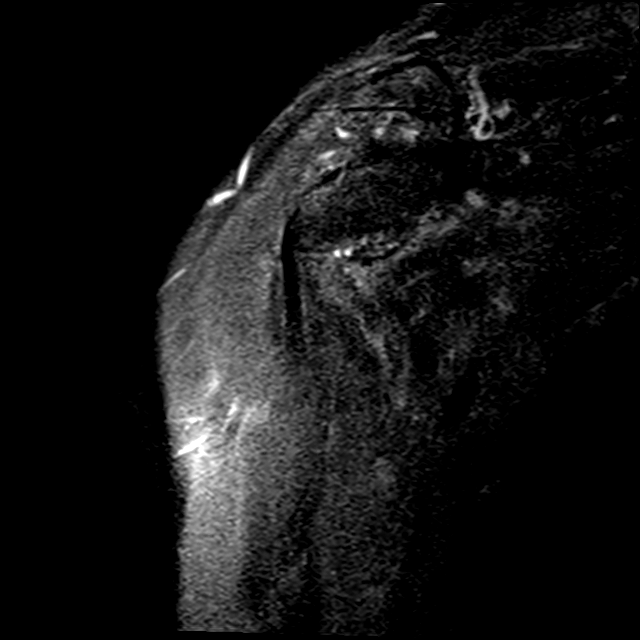
[im 11/21]
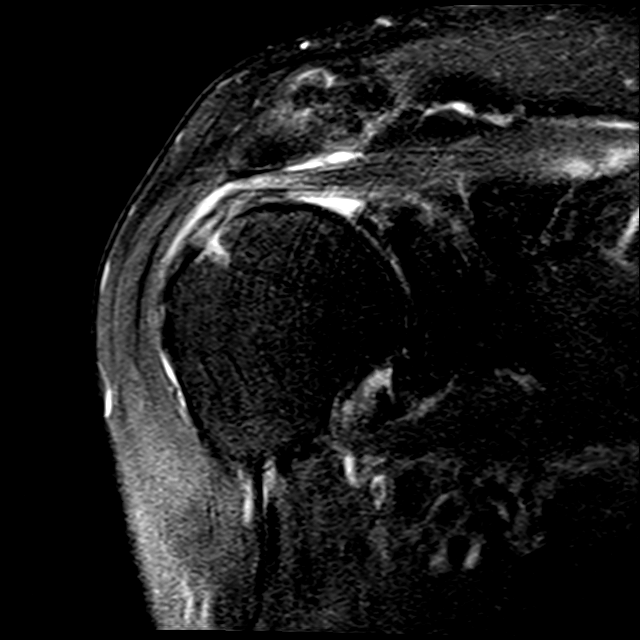
[im 17/21]
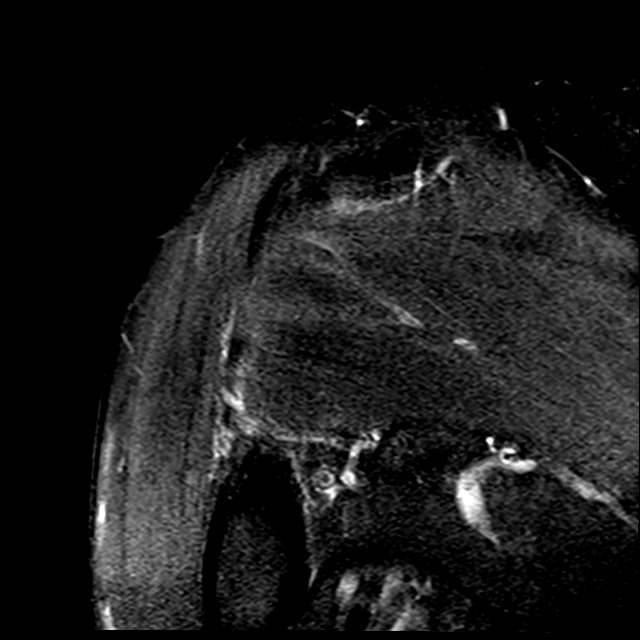

[Series 10: T2 fat-sat · oblique · right · 4.0mm · 0.44mm/px · 3 of 23 slices shown (3 of 3)]
[im 4/23]
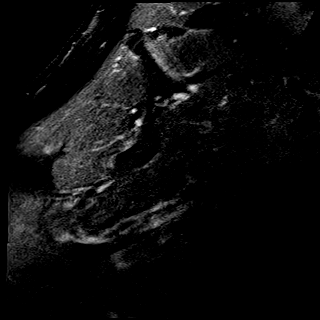
[im 13/23]
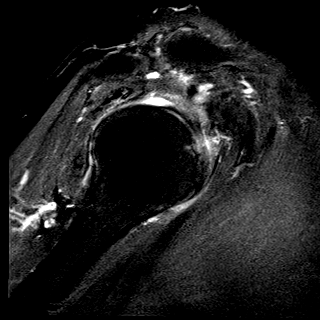
[im 19/23]
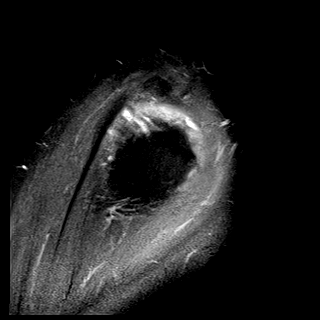

[Series 12: PD · oblique · right · 4.0mm · 0.22mm/px · 7 of 21 slices shown]
[im 1/21]
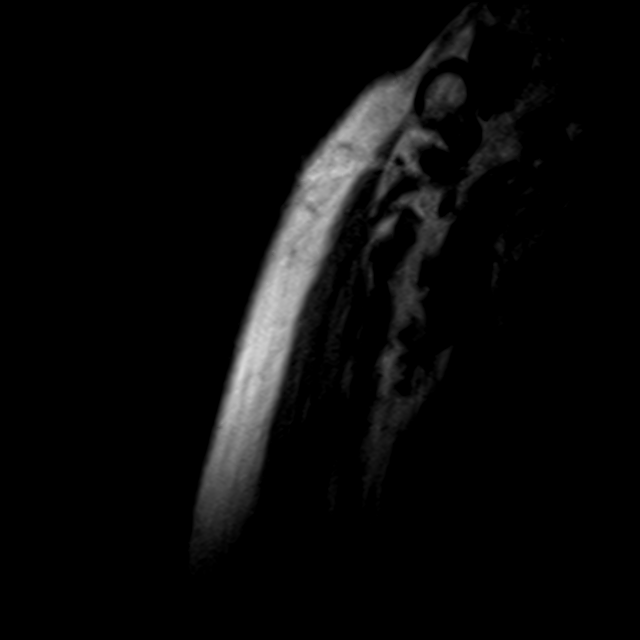
[im 4/21]
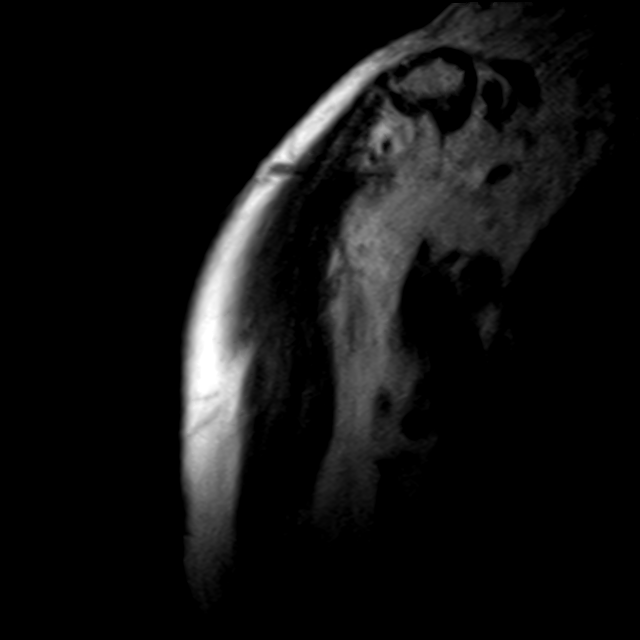
[im 7/21]
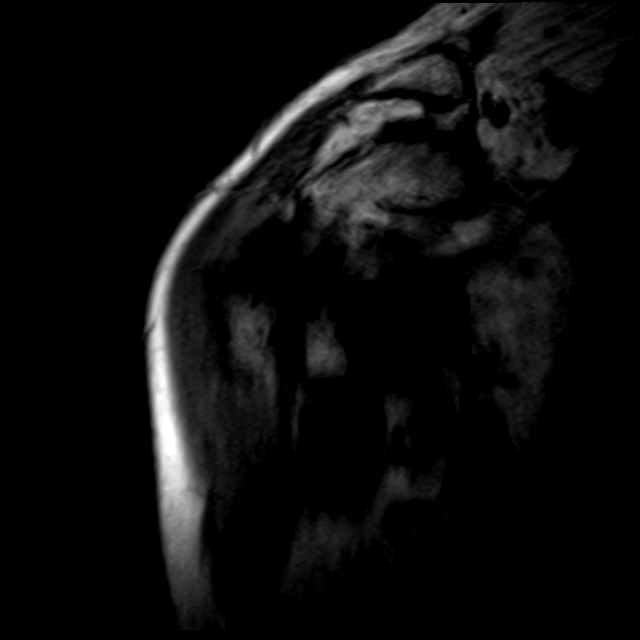
[im 11/21]
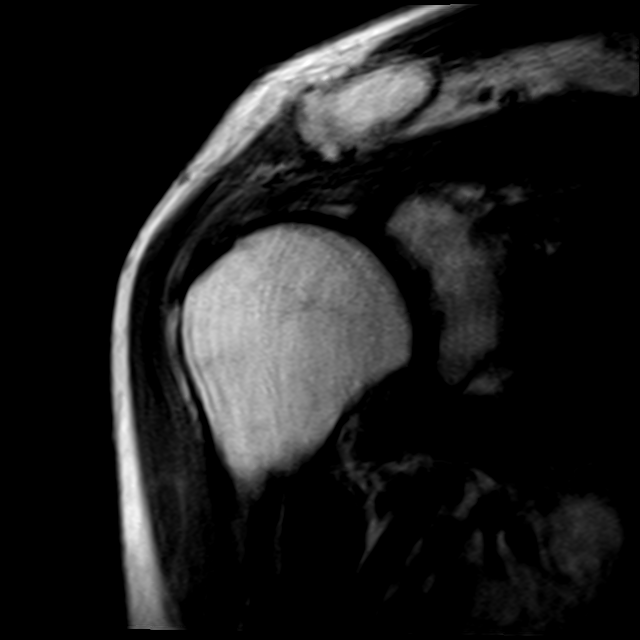
[im 14/21]
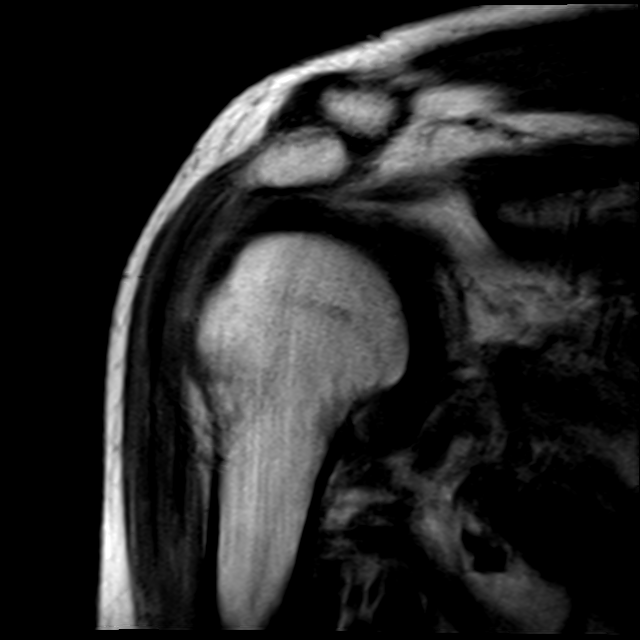
[im 17/21]
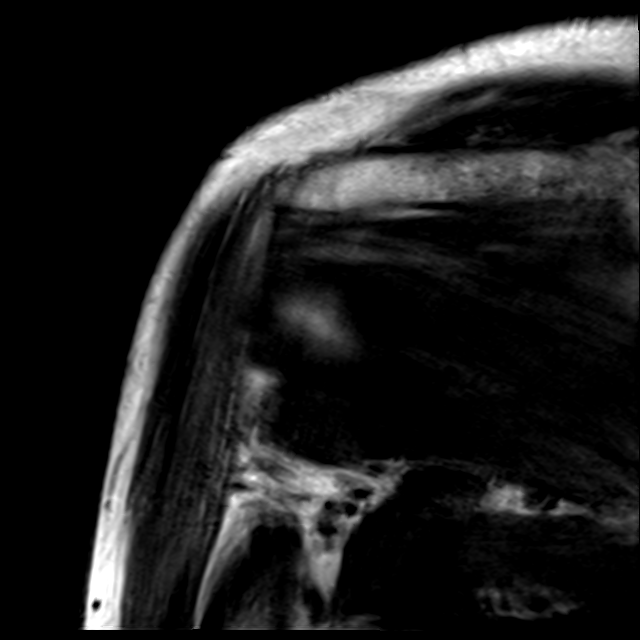
[im 21/21]
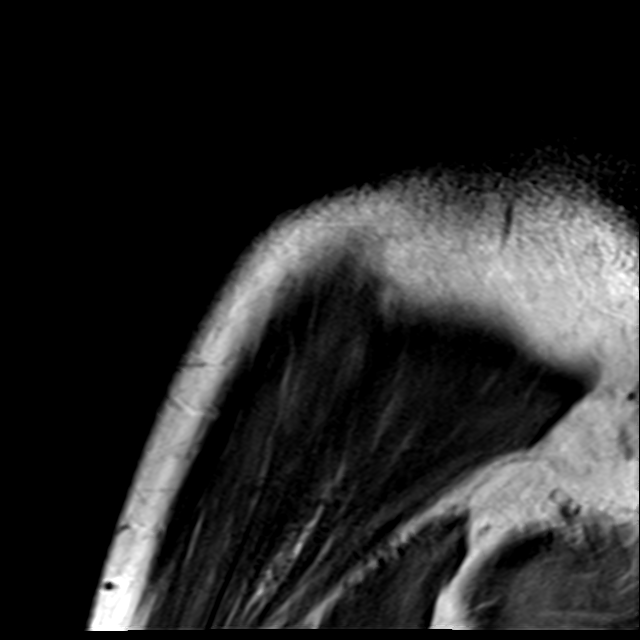

[21 of 40 positions shown; findings below may reference images not displayed]

FINDINGS: Despite efforts by the technologist and patient, motion artifact is
present on today's exam and could not be eliminated. This reduces
exam sensitivity and specificity.

Rotator cuff: Mild supraspinatus tendinosis with low-grade
partial-thickness articular surface tear of the critical zone. Mild
infraspinatus tendinosis. The subscapularis and teres minor tendons
are unremarkable.

Muscles: No atrophy or abnormal signal of the muscles of the rotator
cuff.

Biceps long head:  Intact and normally positioned.

Acromioclavicular Joint: Mild arthropathy of the acromioclavicular
joint. Type I acromion. Small amount of fluid in the
subacromial/subdeltoid bursa.

Glenohumeral Joint: No joint effusion.  No chondral defect.

Labrum: Diffusely degenerated. Superior, posterosuperior, and
posteroinferior labral tear extending from 12-8 o'clock (series 6,
images 12-17; series 7, image 10).

Bones: No acute fracture or dislocation. No suspicious bone lesion.

Other: None.
IMPRESSION: 1. Mild supraspinatus tendinosis with low-grade partial-thickness
articular surface tear of the critical zone.
2. Superior to posteroinferior labral tear from 12-8 o'clock.
3. Mild acromioclavicular osteoarthritis.
4. Mild subacromial/subdeltoid bursitis.

## 2020-09-10 ENCOUNTER — Encounter (HOSPITAL_COMMUNITY): Payer: BC Managed Care – PPO

## 2020-09-11 ENCOUNTER — Other Ambulatory Visit: Payer: Self-pay

## 2020-09-11 ENCOUNTER — Encounter (HOSPITAL_COMMUNITY): Payer: Self-pay | Admitting: *Deleted

## 2020-09-11 ENCOUNTER — Ambulatory Visit: Payer: Self-pay

## 2020-09-11 ENCOUNTER — Encounter: Payer: Self-pay | Admitting: Orthopaedic Surgery

## 2020-09-11 ENCOUNTER — Encounter: Payer: Self-pay | Admitting: Family Medicine

## 2020-09-11 ENCOUNTER — Telehealth (HOSPITAL_COMMUNITY): Payer: Self-pay | Admitting: *Deleted

## 2020-09-11 ENCOUNTER — Ambulatory Visit (INDEPENDENT_AMBULATORY_CARE_PROVIDER_SITE_OTHER): Payer: BC Managed Care – PPO | Admitting: Orthopaedic Surgery

## 2020-09-11 VITALS — Ht 66.0 in | Wt 146.0 lb

## 2020-09-11 DIAGNOSIS — Z951 Presence of aortocoronary bypass graft: Secondary | ICD-10-CM

## 2020-09-11 DIAGNOSIS — M79601 Pain in right arm: Secondary | ICD-10-CM

## 2020-09-11 NOTE — Progress Notes (Signed)
Subjective: Patient is here for ultrasound-guided intra-articular right glenohumeral injection.  He has pain with a large labrum tear.  He works as a Development worker, community.  Objective: Full range of motion with pain reaching overhead.  Procedure: Ultrasound guided injection is preferred based studies that show increased duration, increased effect, greater accuracy, decreased procedural pain, increased response rate, and decreased cost with ultrasound guided versus blind injection.   Verbal informed consent obtained.  Time-out conducted.  Noted no overlying erythema, induration, or other signs of local infection. Ultrasound-guided right glenohumeral injection: After sterile prep with Betadine, injected 4 cc 0.25% bupivocaine without epinephrine and 6 mg betamethasone using a 22-gauge spinal needle, passing the needle from posterior approach into the glenohumeral joint.  Injectate was seen filling the joint capsule.

## 2020-09-11 NOTE — Telephone Encounter (Signed)
Left message to call cardiac rehab.Barnet Pall, RN,BSN 09/11/2020 2:48 PM

## 2020-09-11 NOTE — Progress Notes (Signed)
Spoke with Philip Winters. Philip Winters will not be able to continue with cardiac rehab due to issues with his right shoulder/ hand and knee will discharge from the program. Philip Winters attended 5 exercise sessions between 08/13/20-08/29/20.Barnet Pall, RN,BSN 09/11/2020 3:02 PM

## 2020-09-11 NOTE — Progress Notes (Signed)
Office Visit Note   Patient: Philip Winters           Date of Birth: [birth year]           MRN: [id number] Visit Date: 09/11/2020              Requested by: Antony Contras, MD [place] [address] [place],  [place] [address] PCP: Antony Contras, MD   Assessment & Plan: Visit Diagnoses:  1. Right arm pain     Plan: MRI shows mild supraspinatus tendinosis with a partial-thickness articular surface tear.  He has degenerative superior and posterior labral tears.  These findings were reviewed today and treatment options discussed to include glenohumeral injection for some temporary relief versus arthroscopic debridement and biceps tenotomy versus tenodesis as a more definitive solution for the pain.  For now he would like to do an injection to give him some temporary relief.  He will let us know if he decides to move forward with the surgery in the future either with me or Dr. Durward Fortes.  Follow-Up Instructions: No follow-ups on file.   Orders:  Orders Placed This Encounter  Procedures  . US Guided Needle Placement - No Linked Charges   No orders of the defined types were placed in this encounter.     Procedures: No procedures performed   Clinical Data: No additional findings.   Subjective: Chief Complaint  Patient presents with  . Right Shoulder - Follow-up    MRI review    [name] returns today for MRI review of the right shoulder.  He continues to have constant chronic pain with daily activities.  He has trouble sleeping due to the pain.   Review of Systems  Constitutional: Negative.   All other systems reviewed and are negative.    Objective: Vital Signs: Ht 5\' 6"  (1.676 m)   Wt 146 lb (66.2 kg)   BMI 23.57 kg/m   Physical Exam  Ortho Exam Right shoulder exam is unchanged. Specialty Comments:  No specialty comments available.  Imaging: US Guided Needle Placement - No Linked Charges  Result Date: 09/11/2020 Ultrasound guided injection is preferred  based studies that show increased duration, increased effect, greater accuracy, decreased procedural pain, increased response rate, and decreased cost with ultrasound guided versus blind injection.   Verbal informed consent obtained.  Time-out conducted.  Noted no overlying erythema, induration, or other signs of local infection. Ultrasound-guided right glenohumeral injection: After sterile prep with Betadine, injected 4 cc 0.25% bupivocaine without epinephrine and 6 mg betamethasone using a 22-gauge spinal needle, passing the needle from posterior approach into the glenohumeral joint.  Injectate was seen filling the joint capsule.     PMFS History: Patient Active Problem List   Diagnosis Date Noted  . Chronic left shoulder pain 08/23/2020  . Right arm pain 08/23/2020  . Cubital tunnel syndrome on left 08/23/2020  . Left ankle sprain 07/12/2020  . Visit for wound check 06/11/2020  . Change or removal of surgical wound dressing 05/24/2020  . Pulmonary nodule, left 05/23/2020  . Cellulitis 05/23/2020  . S/P CABG x 2 05/02/2020  . Coronary artery disease 05/01/2020  . Pulmonary nodule 1 cm or greater in diameter 04/26/2020  . Statin intolerance 04/20/2020  . Coronary atherosclerosis of native coronary artery 04/18/2020  . Abnormal cardiovascular stress test   . Nonrheumatic aortic valve stenosis   . Exertional dyspnea   . Stable angina pectoris ([facility])   . Bicuspid aortic valve 04/06/2020  . Primary  osteoarthritis of left knee 11/08/2019  . Bilateral primary osteoarthritis of knee 09/28/2019  . HNP (herniated nucleus pulposus), lumbar 09/29/2017  . Right carotid bruit 06/10/2017  . Paresthesia 06/10/2017  . Left wrist pain 10/09/2016  . Unilateral primary osteoarthritis, right knee 06/24/2016  . S/P total knee replacement using cement, right 06/24/2016  . Acute pain of left knee 03/27/2016  . Mixed hyperlipidemia 01/18/2007  . HYPERTENSION 01/18/2007   Past Medical History:   Diagnosis Date  . Allergy   . Arthritis   . Coronary artery disease   . GERD (gastroesophageal reflux disease)    occ  . Heart murmur    baby  . High cholesterol   . Hypertension   . Pneumonia   . Right carotid bruit 06/10/2017  . Sciatica   . Seasonal allergies   . Sinus tachycardia   . Spinal stenosis     History reviewed. No pertinent family history.  Past Surgical History:  Procedure Laterality Date  . CARDIAC CATHETERIZATION    . CARPAL TUNNEL WITH CUBITAL TUNNEL Left 11/10/2013   Procedure: LEFT CARPAL TUNNEL RELEASE;  Surgeon: Cammie Sickle, MD;  Location: [facility];  Service: Orthopedics;  Laterality: Left;  . COLONOSCOPY    . CORONARY ARTERY BYPASS GRAFT N/A 05/01/2020   Procedure: CORONARY ARTERY BYPASS GRAFTING (CABG) X 2 ON CARDIOPULMONARY BYPASS. LIMA TO LAD, SVG TO DIAG;  Surgeon: Ivin Poot, MD;  Location: [facility];  Service: Open Heart Surgery;  Laterality: N/A;  . ENDOVEIN HARVEST OF GREATER SAPHENOUS VEIN Right 05/01/2020   Procedure: ENDOVEIN HARVEST OF GREATER SAPHENOUS VEIN;  Surgeon: Ivin Poot, MD;  Location: [facility];  Service: Open Heart Surgery;  Laterality: Right;  . KNEE ARTHROSCOPY  [id number]   left and right  . LUMBAR LAMINECTOMY/DECOMPRESSION MICRODISCECTOMY Left 09/29/2017   Procedure: LEFT LUMBAR TWO - LUMBAR THREELAMINOTOMY/MICRODISCECTOMY;  Surgeon: Jovita Gamma, MD;  Location: [facility];  Service: Neurosurgery;  Laterality: Left;  LEFT LUMBAR 2- LUMBAR 3 LAMINOTOMY/MICRODISCECTOMY  . NASAL SINUS SURGERY  05/2015  . RIGHT/LEFT HEART CATH AND CORONARY ANGIOGRAPHY N/A 04/10/2020   Procedure: RIGHT/LEFT HEART CATH AND CORONARY ANGIOGRAPHY;  Surgeon: Troy Sine, MD;  Location: [facility];  Service: Cardiovascular;  Laterality: N/A;  . TEE WITHOUT CARDIOVERSION N/A 05/01/2020   Procedure: TRANSESOPHAGEAL ECHOCARDIOGRAM (TEE);  Surgeon: Prescott Gum, Collier Salina, MD;  Location: [facility];  Service: Open Heart Surgery;   Laterality: N/A;  . TOTAL KNEE ARTHROPLASTY Right 06/24/2016   Procedure: TOTAL KNEE ARTHROPLASTY;  Surgeon: Garald Balding, MD;  Location: [facility];  Service: Orthopedics;  Laterality: Right;  . TRIGGER FINGER RELEASE Left 11/10/2013   Procedure: LEFT INDEX A-1 PULLEY RELEASE;  Surgeon: Cammie Sickle, MD;  Location: [facility];  Service: Orthopedics;  Laterality: Left;  . ULNAR NERVE TRANSPOSITION Left 11/10/2013   Procedure: ULNAR NERVE TRANSPOSITION;  Surgeon: Cammie Sickle, MD;  Location: [facility];  Service: Orthopedics;  Laterality: Left;   Social History   Occupational History  . Occupation: Self employed  Tobacco Use  . Smoking status: Former Smoker    Packs/day: 1.00    Years: 37.00    Pack years: 37.00    Types: Cigarettes    Quit date: 05/04/2016    Years since quitting: 4.3  . Smokeless tobacco: Never Used  Vaping Use  . Vaping Use: Former  Substance and Sexual Activity  . Alcohol use: Yes    Alcohol/week:  3.0 standard drinks    Types: 3 Glasses of wine per week    Comment: weekends only with dinner  . Drug use: No  . Sexual activity: Not on file

## 2020-09-12 ENCOUNTER — Ambulatory Visit: Payer: BC Managed Care – PPO | Admitting: Orthopaedic Surgery

## 2020-09-12 ENCOUNTER — Encounter (HOSPITAL_COMMUNITY): Payer: BC Managed Care – PPO

## 2020-09-14 ENCOUNTER — Encounter (HOSPITAL_COMMUNITY): Payer: BC Managed Care – PPO

## 2020-09-16 ENCOUNTER — Other Ambulatory Visit: Payer: BC Managed Care – PPO

## 2020-09-17 ENCOUNTER — Encounter (HOSPITAL_COMMUNITY): Payer: BC Managed Care – PPO

## 2020-09-19 ENCOUNTER — Encounter (HOSPITAL_COMMUNITY): Payer: BC Managed Care – PPO

## 2020-09-19 DIAGNOSIS — I10 Essential (primary) hypertension: Secondary | ICD-10-CM | POA: Diagnosis not present

## 2020-09-19 DIAGNOSIS — M25512 Pain in left shoulder: Secondary | ICD-10-CM | POA: Diagnosis not present

## 2020-09-19 DIAGNOSIS — G5623 Lesion of ulnar nerve, bilateral upper limbs: Secondary | ICD-10-CM | POA: Diagnosis not present

## 2020-09-19 DIAGNOSIS — G5603 Carpal tunnel syndrome, bilateral upper limbs: Secondary | ICD-10-CM | POA: Diagnosis not present

## 2020-09-19 NOTE — Progress Notes (Signed)
Office Visit Note  Patient: Philip Winters             Date of Birth: 10/23/1960           MRN: [id number]             PCP: Antony Contras, MD Referring: Leandrew Koyanagi, MD Visit Date: 09/20/2020 Occupation: Plumber  Subjective:  New Patient (Initial Visit) (Patient complains of bilateral hand pain and swelling. )   History of Present Illness: Philip Winters is a 60 y.o. male here for evaluation of hand pain and swelling.  He has a history of bilateral knee arthritis with previous replacement of the right knee chronic osteoarthritis pain in bilateral hands and has had previous surgery for left carpal tunnel and cubital tunnel syndrome years ago.  Current problems got worse after he underwent coronary artery bypass grafting for coronary disease diagnosed after he was noticing increased exertional dyspnea.  Since then he has been having a lot of increased pain in his bilateral arms he has had advanced imaging of bilateral shoulders showing tendinitis and bursitis of the left shoulder more recently showing a labral tear and partial thickness rotator cuff tendon tear of the right shoulder.  Steroid injections have helped in the left shoulder symptoms but right shoulder is still extremely painful.  Additionally he is noticing swelling occurring in both hands sometimes around the thumb joint and sometimes in the entire hand.  He does not feel like the symptoms are similar to his previous carpal tunnel syndrome although recent neurologic testing was indicative for some sensory and motor neuron loss favoring recurrence over residual past injury.   Labs reviewed 07/2020 ANA neg RF neg Uric acid 3.7 ESR 80  Activities of Daily Living:  Patient reports morning stiffness for 2 hours.   Patient Denies nocturnal pain.  Difficulty dressing/grooming: Denies Difficulty climbing stairs: Reports Difficulty getting out of chair: Denies Difficulty using hands for taps, buttons, cutlery, and/or writing:  Reports  Review of Systems  Constitutional: Negative for fatigue.  HENT: Negative for mouth sores, mouth dryness and nose dryness.   Eyes: Positive for itching. Negative for pain, visual disturbance and dryness.  Respiratory: Negative for cough, hemoptysis, shortness of breath and difficulty breathing.   Cardiovascular: Negative for chest pain, palpitations and swelling in legs/feet.  Gastrointestinal: Negative for abdominal pain, blood in stool, constipation and diarrhea.  Endocrine: Negative for increased urination.  Genitourinary: Negative for painful urination.  Musculoskeletal: Positive for arthralgias, joint pain, joint swelling and morning stiffness. Negative for myalgias, muscle weakness, muscle tenderness and myalgias.  Skin: Positive for redness. Negative for color change and rash.  Allergic/Immunologic: Negative for susceptible to infections.  Neurological: Negative for dizziness, numbness, headaches, memory loss and weakness.  Hematological: Negative for swollen glands.  Psychiatric/Behavioral: Negative for confusion and sleep disturbance.    PMFS History:  Patient Active Problem List   Diagnosis Date Noted  . Bilateral hand pain 09/20/2020  . Chronic left shoulder pain 08/23/2020  . Right arm pain 08/23/2020  . Cubital tunnel syndrome on left 08/23/2020  . Left ankle sprain 07/12/2020  . Visit for wound check 06/11/2020  . Change or removal of surgical wound dressing 05/24/2020  . Pulmonary nodule, left 05/23/2020  . Cellulitis 05/23/2020  . S/P CABG x 2 05/02/2020  . Coronary artery disease 05/01/2020  . Pulmonary nodule 1 cm or greater in diameter 04/26/2020  . Statin intolerance 04/20/2020  . Coronary atherosclerosis of native coronary artery 04/18/2020  .  Abnormal cardiovascular stress test   . Nonrheumatic aortic valve stenosis   . Exertional dyspnea   . Stable angina pectoris ([place])   . Bicuspid aortic valve 04/06/2020  . Primary osteoarthritis of left knee  11/08/2019  . Bilateral primary osteoarthritis of knee 09/28/2019  . HNP (herniated nucleus pulposus), lumbar 09/29/2017  . Right carotid bruit 06/10/2017  . Paresthesia 06/10/2017  . Left wrist pain 10/09/2016  . Unilateral primary osteoarthritis, right knee 06/24/2016  . S/P total knee replacement using cement, right 06/24/2016  . Acute pain of left knee 03/27/2016  . Mixed hyperlipidemia 01/18/2007  . HYPERTENSION 01/18/2007    Past Medical History:  Diagnosis Date  . Allergy   . Arthritis   . Coronary artery disease   . GERD (gastroesophageal reflux disease)    occ  . Heart murmur    baby  . High cholesterol   . Hypertension   . Pneumonia   . Right carotid bruit 06/10/2017  . Sciatica   . Seasonal allergies   . Sinus tachycardia   . Spinal stenosis     History reviewed. No pertinent family history. Past Surgical History:  Procedure Laterality Date  . CARDIAC CATHETERIZATION    . CARPAL TUNNEL WITH CUBITAL TUNNEL Left 11/10/2013   Procedure: LEFT CARPAL TUNNEL RELEASE;  Surgeon: Cammie Sickle, MD;  Location: [facility];  Service: Orthopedics;  Laterality: Left;  . COLONOSCOPY    . CORONARY ARTERY BYPASS GRAFT N/A 05/01/2020   Procedure: CORONARY ARTERY BYPASS GRAFTING (CABG) X 2 ON CARDIOPULMONARY BYPASS. LIMA TO LAD, SVG TO DIAG;  Surgeon: Ivin Poot, MD;  Location: [facility];  Service: Open Heart Surgery;  Laterality: N/A;  . ENDOVEIN HARVEST OF GREATER SAPHENOUS VEIN Right 05/01/2020   Procedure: ENDOVEIN HARVEST OF GREATER SAPHENOUS VEIN;  Surgeon: Ivin Poot, MD;  Location: [facility];  Service: Open Heart Surgery;  Laterality: Right;  . KNEE ARTHROSCOPY  [id number]   left and right  . LUMBAR LAMINECTOMY/DECOMPRESSION MICRODISCECTOMY Left 09/29/2017   Procedure: LEFT LUMBAR TWO - LUMBAR THREELAMINOTOMY/MICRODISCECTOMY;  Surgeon: Jovita Gamma, MD;  Location: [facility];  Service: Neurosurgery;  Laterality: Left;  LEFT LUMBAR 2- LUMBAR 3  LAMINOTOMY/MICRODISCECTOMY  . NASAL SINUS SURGERY  05/2015  . RIGHT/LEFT HEART CATH AND CORONARY ANGIOGRAPHY N/A 04/10/2020   Procedure: RIGHT/LEFT HEART CATH AND CORONARY ANGIOGRAPHY;  Surgeon: Troy Sine, MD;  Location: [facility];  Service: Cardiovascular;  Laterality: N/A;  . TEE WITHOUT CARDIOVERSION N/A 05/01/2020   Procedure: TRANSESOPHAGEAL ECHOCARDIOGRAM (TEE);  Surgeon: Prescott Gum, Collier Salina, MD;  Location: [facility];  Service: Open Heart Surgery;  Laterality: N/A;  . TOTAL KNEE ARTHROPLASTY Right 06/24/2016   Procedure: TOTAL KNEE ARTHROPLASTY;  Surgeon: Garald Balding, MD;  Location: [facility];  Service: Orthopedics;  Laterality: Right;  . TRIGGER FINGER RELEASE Left 11/10/2013   Procedure: LEFT INDEX A-1 PULLEY RELEASE;  Surgeon: Cammie Sickle, MD;  Location: [facility];  Service: Orthopedics;  Laterality: Left;  . ULNAR NERVE TRANSPOSITION Left 11/10/2013   Procedure: ULNAR NERVE TRANSPOSITION;  Surgeon: Cammie Sickle, MD;  Location: [facility];  Service: Orthopedics;  Laterality: Left;   Social History   Social History Narrative   Lives with wife, [name]   Caffeine use: Coffee daily   Right handed    Immunization History  Administered Date(s) Administered  . Influenza Split 02/20/2012  . Influenza,inj,Quad PF,6+ Mos 01/25/2013  . Tdap 09/18/2016  Objective: Vital Signs: BP 137/83 (BP Location: Right Arm, Patient Position: Sitting, Cuff Size: Normal)   Pulse 88   Ht 5' 6"  (1.676 m)   Wt 142 lb 9.6 oz (64.7 kg)   BMI 23.02 kg/m    Physical Exam HENT:     Right Ear: External ear normal.     Left Ear: External ear normal.  Eyes:     Conjunctiva/sclera: Conjunctivae normal.  Cardiovascular:     Rate and Rhythm: Normal rate and regular rhythm.  Pulmonary:     Effort: Pulmonary effort is normal.     Breath sounds: Normal breath sounds.  Skin:    General: Skin is warm and dry.     Findings: No rash.  Neurological:      General: No focal deficit present.     Mental Status: He is alert.  Psychiatric:        Mood and Affect: Mood normal.    Musculoskeletal Exam:  Neck full ROM no tenderness Shoulders right side very painful with internal and external rotation while abducted left side full range of motion Elbows full ROM no tenderness or swelling percussion over cubital tunnel not particularly provocative for symptoms Wrists full ROM left side with soft tissue swelling and stiffness and tenderness Fingers full ROM bilateral thumb MCP joint bony enlargement and tenderness and to lesser extent first CMC joint tenderness Hip normal internal and external rotation without pain, no tenderness to lateral hip palpation Knees right full ROM no tenderness or swelling, left knee crepitus with no effusion Ankles full ROM no tenderness or swelling  Investigation: No additional findings.  Imaging: MR SHOULDER RIGHT WO CONTRAST  Result Date: 09/10/2020 CLINICAL DATA:  Right shoulder pain for the past 3 months. No injury or prior surgery. EXAM: MRI OF THE RIGHT SHOULDER WITHOUT CONTRAST TECHNIQUE: Multiplanar, multisequence MR imaging of the shoulder was performed. No intravenous contrast was administered. COMPARISON:  None. FINDINGS: Despite efforts by the technologist and patient, motion artifact is present on today's exam and could not be eliminated. This reduces exam sensitivity and specificity. Rotator cuff: Mild supraspinatus tendinosis with low-grade partial-thickness articular surface tear of the critical zone. Mild infraspinatus tendinosis. The subscapularis and teres minor tendons are unremarkable. Muscles: No atrophy or abnormal signal of the muscles of the rotator cuff. Biceps long head:  Intact and normally positioned. Acromioclavicular Joint: Mild arthropathy of the acromioclavicular joint. Type I acromion. Small amount of fluid in the subacromial/subdeltoid bursa. Glenohumeral Joint: No joint effusion.  No  chondral defect. Labrum: Diffusely degenerated. Superior, posterosuperior, and posteroinferior labral tear extending from 12-8 o'clock (series 6, images 12-17; series 7, image 10). Bones: No acute fracture or dislocation. No suspicious bone lesion. Other: None. IMPRESSION: 1. Mild supraspinatus tendinosis with low-grade partial-thickness articular surface tear of the critical zone. 2. Superior to posteroinferior labral tear from 12-8 o'clock. 3. Mild acromioclavicular osteoarthritis. 4. Mild subacromial/subdeltoid bursitis. Electronically Signed   By: Titus Dubin M.D.   On: 09/10/2020 10:40   US Guided Needle Placement - No Linked Charges  Result Date: 09/11/2020 Ultrasound guided injection is preferred based studies that show increased duration, increased effect, greater accuracy, decreased procedural pain, increased response rate, and decreased cost with ultrasound guided versus blind injection.   Verbal informed consent obtained.  Time-out conducted.  Noted no overlying erythema, induration, or other signs of local infection. Ultrasound-guided right glenohumeral injection: After sterile prep with Betadine, injected 4 cc 0.25% bupivocaine without epinephrine and 6 mg betamethasone using a 22-gauge spinal  needle, passing the needle from posterior approach into the glenohumeral joint.  Injectate was seen filling the joint capsule.   XR Hand 2 View Left  Result Date: 09/20/2020 Xray left hand 2 views Radiocarpal joint space appears normal there is small calcification or loose body at ulnar styloid. Severe degenerative change of the [facility]st [facility] joint and subluxation at 1st MCP. MCP joints appear normal. Degenerative changes of DIP more than PIP joints, with horizontal osteophytes. Periosteal reaction in proximal and middle phalanges. No erosions or demineralization seen. Impression Advanced osteoarthritis of the 1st CMC joint and osteoarthritis in distal finger joints without inflammatory changes seen  VAS [name]  UPPER EXTREMITY VENOUS DUPLEX  Result Date: 08/29/2020 UPPER VENOUS STUDY  Indications: Pain, Swelling, and Edema Other Indications: Right shoulder pain and right hand edema. Performing Technologist: Delorise Shiner RVT  Examination Guidelines: A complete evaluation includes B-mode imaging, spectral Doppler, color Doppler, and power Doppler as needed of all accessible portions of each vessel. Bilateral testing is considered an integral part of a complete examination. Limited examinations for reoccurring indications may be performed as noted.  Right Findings: +----------+------------+---------+-----------+----------+-------+ RIGHT     CompressiblePhasicitySpontaneousPropertiesSummary +----------+------------+---------+-----------+----------+-------+ IJV           Full       Yes       Yes                      +----------+------------+---------+-----------+----------+-------+ Subclavian    Full       Yes       Yes                      +----------+------------+---------+-----------+----------+-------+ Axillary      Full       Yes       Yes                      +----------+------------+---------+-----------+----------+-------+ Brachial      Full       Yes       Yes                      +----------+------------+---------+-----------+----------+-------+ Radial        Full                                          +----------+------------+---------+-----------+----------+-------+ Ulnar         Full                                          +----------+------------+---------+-----------+----------+-------+ Cephalic      Full                 Yes                      +----------+------------+---------+-----------+----------+-------+ Basilic       Full                 Yes                      +----------+------------+---------+-----------+----------+-------+  Left Findings: +----------+------------+---------+-----------+----------+-------+ LEFT       CompressiblePhasicitySpontaneousPropertiesSummary +----------+------------+---------+-----------+----------+-------+ Subclavian               Yes  Yes                      +----------+------------+---------+-----------+----------+-------+  Findings reported to Voicemail at 10:15.  Summary:  Right: No evidence of deep vein thrombosis in the upper extremity. No evidence of superficial vein thrombosis in the upper extremity.  Left: No evidence of thrombosis in the subclavian.  *See table(s) above for measurements and observations.  Diagnosing physician: Deitra Mayo MD Electronically signed by Deitra Mayo MD on 08/29/2020 at 12:46:31 PM.    Final     Recent Labs: Lab Results  Component Value Date   WBC 9.5 05/06/2020   HGB 11.9 (L) 05/06/2020   PLT 241 05/06/2020   NA 126 (L) 05/07/2020   K 3.8 05/07/2020   CL 87 (L) 05/07/2020   CO2 28 05/07/2020   GLUCOSE 104 (H) 05/07/2020   BUN 12 05/07/2020   CREATININE 0.67 05/07/2020   BILITOT 0.8 04/30/2020   ALKPHOS 131 (H) 04/30/2020   AST 27 04/30/2020   ALT 23 04/30/2020   PROT 7.0 04/30/2020   ALBUMIN 3.7 04/30/2020   CALCIUM 8.8 (L) 05/07/2020   GFRAA 115 04/06/2020    Speciality Comments: No specialty comments available.  Procedures:  No procedures performed Allergies: Mushroom extract complex, Penicillins, Atorvastatin, Chantix [varenicline], Dilaudid [hydromorphone], Losartan potassium, Pravastatin, Rosuvastatin, Bystolic [nebivolol hcl], Codeine, Iodine, and Metoprolol   Assessment / Plan:     Visit Diagnoses: Bilateral hand pain - Plan: XR Hand 2 View Right, XR Hand 2 View Left  Bilateral hand pain and wrist pain with intermittent swelling mostly around the thumb and proximal hand could represent CPDD. His recent nerve conduction test demonstrates probably significant compressive myelopathy so I definitely recommend he follow up for this. I do not see any active joint inflammation on exam today. Will  check bilateral hand xrays for any erosive change, osteopenia, or calcifications. Picture is not typical for RA and negative serology. Could consider trial of colchicine or prednisone for recurrent flares so recommended PRN follow up.  Orders: Orders Placed This Encounter  Procedures  . XR Hand 2 View Right  . XR Hand 2 View Left   No orders of the defined types were placed in this encounter.   Follow-Up Instructions: No follow-ups on file.   Collier Salina, MD  Note - This record has been created using [employer].  Chart creation errors have been sought, but may not always  have been located. Such creation errors do not reflect on  the standard of medical care.

## 2020-09-20 ENCOUNTER — Ambulatory Visit: Payer: Self-pay

## 2020-09-20 ENCOUNTER — Encounter: Payer: Self-pay | Admitting: Internal Medicine

## 2020-09-20 ENCOUNTER — Ambulatory Visit: Payer: BC Managed Care – PPO | Admitting: Internal Medicine

## 2020-09-20 ENCOUNTER — Other Ambulatory Visit: Payer: Self-pay

## 2020-09-20 VITALS — BP 137/83 | HR 88 | Ht 66.0 in | Wt 142.6 lb

## 2020-09-20 DIAGNOSIS — M79641 Pain in right hand: Secondary | ICD-10-CM | POA: Diagnosis not present

## 2020-09-20 DIAGNOSIS — M79642 Pain in left hand: Secondary | ICD-10-CM | POA: Diagnosis not present

## 2020-09-21 ENCOUNTER — Encounter (HOSPITAL_COMMUNITY): Payer: BC Managed Care – PPO

## 2020-09-24 ENCOUNTER — Encounter (HOSPITAL_COMMUNITY): Payer: BC Managed Care – PPO

## 2020-09-26 ENCOUNTER — Ambulatory Visit: Payer: BC Managed Care – PPO | Admitting: Orthopaedic Surgery

## 2020-09-26 ENCOUNTER — Other Ambulatory Visit: Payer: Self-pay

## 2020-09-26 ENCOUNTER — Encounter: Payer: Self-pay | Admitting: Orthopaedic Surgery

## 2020-09-26 ENCOUNTER — Encounter (HOSPITAL_COMMUNITY): Payer: BC Managed Care – PPO

## 2020-09-26 DIAGNOSIS — M7501 Adhesive capsulitis of right shoulder: Secondary | ICD-10-CM | POA: Insufficient documentation

## 2020-09-26 DIAGNOSIS — G5603 Carpal tunnel syndrome, bilateral upper limbs: Secondary | ICD-10-CM | POA: Diagnosis not present

## 2020-09-26 NOTE — Progress Notes (Signed)
Office Visit Note   Patient: Philip Winters           Date of Birth: February 12, 1961           MRN: [id number] Visit Date: 09/26/2020              Requested by: Antony Contras, MD [place],  [place] [id number] PCP: Antony Contras, MD   Assessment & Plan: Visit Diagnoses:  1. Adhesive capsulitis of right shoulder   2. Carpal tunnel syndrome, bilateral upper limbs     Plan: Mr. Frasier has several issues in regards to his upper extremities.  He notes that after his CABG procedure he developed bilateral shoulder pain.  He has been seeing Dr. Erlinda Hong who injected his left shoulder with excellent relief.  He continues to have a problem with his right shoulder despite cortisone.  Accordingly an MRI scan was performed demonstrating some tearing of the labrum and some partial tearing of the rotator cuff.  He continues to have considerable pain with limited overhead activity.  Clinically he has adhesive capsulitis of his right shoulder and I am only able to flex about 100 degrees and abduct about 80 degrees at which point he is uncomfortable.  He also was then experiencing numbness and tingling into both upper extremities.  He has had prior ulnar nerve decompression of both elbows in the distant past and a left carpal tunnel release by Dr. Cypher years ago.  His recent EMGs nerve conduction studies revealed residual moderate to severe carpal tunnel and compression of the ulnar nerve at the elbows.  He does have positive Tinel's over the ulnar nerve at the right elbow but negative Tinel's over both wrists.  I think his biggest problem at present is the frozen shoulder.  After much discussion I think the best treatment would be closed manipulation of the right shoulder and then monitor his response.  He is fully aware this may or may not resolve all of his problems but I think it is the next simplest approach.  We will monitor the ulnar nerve and median nerve compression over time  Follow-Up  Instructions: Return We will schedule closed manipulation adhesive capsulitis right shoulder.   Orders:  No orders of the defined types were placed in this encounter.  No orders of the defined types were placed in this encounter.     Procedures: No procedures performed   Clinical Data: No additional findings.   Subjective: Chief Complaint  Patient presents with  . Right Shoulder - Pain  Patient presents today for his right shoulder. He has been seeing Dr.Xu and has had an MRI.  He notes he has been having a problem with both upper extremities since he had his CABG procedure months ago.  He has been seeing Dr. Erlinda Hong for bilateral shoulder pain with relief on the left with cortisone but not on the right.  He has difficulty raising his right arm over his head.  Denies any injury or trauma.  He also has been experiencing numbness and tingling in the both upper extremities.  He had a recent EMG and nerve conduction study to both upper extremities to the neurosurgeons office.  We have these for review.  He has had prior bilateral ulnar nerve decompressions and left carpal tunnel release HPI  Review of Systems   Objective: Vital Signs: There were no vitals taken for this visit.  Physical Exam Constitutional:      Appearance: He is well-developed.  Eyes:     Pupils: Pupils are equal, round, and reactive to light.  Pulmonary:     Effort: Pulmonary effort is normal.  Skin:    General: Skin is warm and dry.  Neurological:     Mental Status: He is alert and oriented to person, place, and time.  Psychiatric:        Behavior: Behavior normal.     Ortho Exam right shoulder with limited range of motion.  He only had about 100 degrees of flexion about 80 degrees of abduction at which point he was uncomfortable.  Biceps appears to be intact.  Skin intact.  Did not have any areas of tenderness with his arm at his side.  Appears to have good strength.  Had positive Tinel's over the ulnar  nerve at the right elbow but not the left.  Motor exam was intact to both upper extremities including intrinsic and extrinsic muscles.  Negative Tinel's over both median nerves has had prior surgical release of the ulnar nerves of both elbows and carpal tunnel on the left.  Has some swelling of his fingers on the right and just barely able to make a full fist but has good opposition of thumb to the little finger Specialty Comments:  No specialty comments available.  Imaging: No results found.   PMFS History: Patient Active Problem List   Diagnosis Date Noted  . Adhesive capsulitis of right shoulder 09/26/2020  . Carpal tunnel syndrome, bilateral upper limbs 09/26/2020  . Bilateral hand pain 09/20/2020  . Chronic left shoulder pain 08/23/2020  . Right arm pain 08/23/2020  . Cubital tunnel syndrome on left 08/23/2020  . Left ankle sprain 07/12/2020  . Visit for wound check 06/11/2020  . Change or removal of surgical wound dressing 05/24/2020  . Pulmonary nodule, left 05/23/2020  . Cellulitis 05/23/2020  . S/P CABG x 2 05/02/2020  . Coronary artery disease 05/01/2020  . Pulmonary nodule 1 cm or greater in diameter 04/26/2020  . Statin intolerance 04/20/2020  . Coronary atherosclerosis of native coronary artery 04/18/2020  . Abnormal cardiovascular stress test   . Nonrheumatic aortic valve stenosis   . Exertional dyspnea   . Stable angina pectoris ([place])   . Bicuspid aortic valve 04/06/2020  . Primary osteoarthritis of left knee 11/08/2019  . Bilateral primary osteoarthritis of knee 09/28/2019  . HNP (herniated nucleus pulposus), lumbar 09/29/2017  . Right carotid bruit 06/10/2017  . Paresthesia 06/10/2017  . Left wrist pain 10/09/2016  . Unilateral primary osteoarthritis, right knee 06/24/2016  . S/P total knee replacement using cement, right 06/24/2016  . Acute pain of left knee 03/27/2016  . Mixed hyperlipidemia 01/18/2007  . HYPERTENSION 01/18/2007   Past Medical History:   Diagnosis Date  . Allergy   . Arthritis   . Coronary artery disease   . GERD (gastroesophageal reflux disease)    occ  . Heart murmur    baby  . High cholesterol   . Hypertension   . Pneumonia   . Right carotid bruit 06/10/2017  . Sciatica   . Seasonal allergies   . Sinus tachycardia   . Spinal stenosis     History reviewed. No pertinent family history.  Past Surgical History:  Procedure Laterality Date  . CARDIAC CATHETERIZATION    . CARPAL TUNNEL WITH CUBITAL TUNNEL Left 11/10/2013   Procedure: LEFT CARPAL TUNNEL RELEASE;  Surgeon: Cammie Sickle, MD;  Location: [facility];  Service: Orthopedics;  Laterality: Left;  . COLONOSCOPY    .  CORONARY ARTERY BYPASS GRAFT N/A 05/01/2020   Procedure: CORONARY ARTERY BYPASS GRAFTING (CABG) X 2 ON CARDIOPULMONARY BYPASS. LIMA TO LAD, SVG TO DIAG;  Surgeon: Ivin Poot, MD;  Location: [facility];  Service: Open Heart Surgery;  Laterality: N/A;  . ENDOVEIN HARVEST OF GREATER SAPHENOUS VEIN Right 05/01/2020   Procedure: ENDOVEIN HARVEST OF GREATER SAPHENOUS VEIN;  Surgeon: Ivin Poot, MD;  Location: [facility];  Service: Open Heart Surgery;  Laterality: Right;  . KNEE ARTHROSCOPY  [id number]   left and right  . LUMBAR LAMINECTOMY/DECOMPRESSION MICRODISCECTOMY Left 09/29/2017   Procedure: LEFT LUMBAR TWO - LUMBAR THREELAMINOTOMY/MICRODISCECTOMY;  Surgeon: Jovita Gamma, MD;  Location: [facility];  Service: Neurosurgery;  Laterality: Left;  LEFT LUMBAR 2- LUMBAR 3 LAMINOTOMY/MICRODISCECTOMY  . NASAL SINUS SURGERY  05/2015  . RIGHT/LEFT HEART CATH AND CORONARY ANGIOGRAPHY N/A 04/10/2020   Procedure: RIGHT/LEFT HEART CATH AND CORONARY ANGIOGRAPHY;  Surgeon: Troy Sine, MD;  Location: [facility];  Service: Cardiovascular;  Laterality: N/A;  . TEE WITHOUT CARDIOVERSION N/A 05/01/2020   Procedure: TRANSESOPHAGEAL ECHOCARDIOGRAM (TEE);  Surgeon: Prescott Gum, Collier Salina, MD;  Location: [facility];  Service: Open Heart Surgery;   Laterality: N/A;  . TOTAL KNEE ARTHROPLASTY Right 06/24/2016   Procedure: TOTAL KNEE ARTHROPLASTY;  Surgeon: Garald Balding, MD;  Location: [facility];  Service: Orthopedics;  Laterality: Right;  . TRIGGER FINGER RELEASE Left 11/10/2013   Procedure: LEFT INDEX A-1 PULLEY RELEASE;  Surgeon: Cammie Sickle, MD;  Location: [facility];  Service: Orthopedics;  Laterality: Left;  . ULNAR NERVE TRANSPOSITION Left 11/10/2013   Procedure: ULNAR NERVE TRANSPOSITION;  Surgeon: Cammie Sickle, MD;  Location: [facility];  Service: Orthopedics;  Laterality: Left;   Social History   Occupational History  . Occupation: Self employed  Tobacco Use  . Smoking status: Former Smoker    Packs/day: 1.00    Years: 37.00    Pack years: 37.00    Types: Cigarettes    Quit date: 05/04/2016    Years since quitting: 4.4  . Smokeless tobacco: Never Used  Vaping Use  . Vaping Use: Former  Substance and Sexual Activity  . Alcohol use: Yes    Alcohol/week: 3.0 standard drinks    Types: 3 Glasses of wine per week    Comment: weekends only with dinner  . Drug use: No  . Sexual activity: Not on file

## 2020-09-28 ENCOUNTER — Encounter (HOSPITAL_COMMUNITY): Payer: BC Managed Care – PPO

## 2020-09-28 ENCOUNTER — Ambulatory Visit: Payer: BC Managed Care – PPO | Admitting: Neurology

## 2020-09-28 NOTE — Progress Notes (Signed)
Philip Winters had hand xrays checked in clinic last week. These show pretty advanced amount of osteoarthritis but no evidence of rheumatoid arthritis that I can see. He already has follow up with orthopedics which is appropriate for his arthritis and nerve impingement issues so I do not think any particular rheumatology f/u is needed at this time.

## 2020-10-01 ENCOUNTER — Encounter (HOSPITAL_COMMUNITY): Payer: BC Managed Care – PPO

## 2020-10-01 ENCOUNTER — Other Ambulatory Visit: Payer: BC Managed Care – PPO

## 2020-10-03 ENCOUNTER — Other Ambulatory Visit: Payer: Self-pay

## 2020-10-03 ENCOUNTER — Other Ambulatory Visit: Payer: Self-pay | Admitting: Thoracic Surgery (Cardiothoracic Vascular Surgery)

## 2020-10-03 ENCOUNTER — Other Ambulatory Visit: Payer: BC Managed Care – PPO | Admitting: *Deleted

## 2020-10-03 ENCOUNTER — Encounter (HOSPITAL_COMMUNITY): Payer: BC Managed Care – PPO

## 2020-10-03 DIAGNOSIS — I2510 Atherosclerotic heart disease of native coronary artery without angina pectoris: Secondary | ICD-10-CM | POA: Diagnosis not present

## 2020-10-03 DIAGNOSIS — R911 Solitary pulmonary nodule: Secondary | ICD-10-CM

## 2020-10-03 LAB — LIPID PANEL
Chol/HDL Ratio: 1.8 ratio (ref 0.0–5.0)
Cholesterol, Total: 105 mg/dL (ref 100–199)
HDL: 58 mg/dL (ref >39)
LDL Chol Calc (NIH): 28 mg/dL (ref 0–99)
Triglycerides: 103 mg/dL (ref 0–149)
VLDL Cholesterol Cal: 19 mg/dL (ref 5–40)

## 2020-10-03 LAB — HEPATIC FUNCTION PANEL
ALT: 16 IU/L (ref 0–44)
AST: 18 IU/L (ref 0–40)
Albumin: 4.4 g/dL (ref 3.8–4.9)
Alkaline Phosphatase: 166 IU/L — ABNORMAL HIGH (ref 44–121)
Bilirubin Total: 0.3 mg/dL (ref 0.0–1.2)
Bilirubin, Direct: 0.12 mg/dL (ref 0.00–0.40)
Total Protein: 6.8 g/dL (ref 6.0–8.5)

## 2020-10-05 ENCOUNTER — Encounter (HOSPITAL_COMMUNITY): Payer: BC Managed Care – PPO

## 2020-10-05 ENCOUNTER — Encounter: Payer: Self-pay | Admitting: Orthopaedic Surgery

## 2020-10-09 ENCOUNTER — Other Ambulatory Visit: Payer: Self-pay

## 2020-10-09 ENCOUNTER — Ambulatory Visit (INDEPENDENT_AMBULATORY_CARE_PROVIDER_SITE_OTHER): Payer: BC Managed Care – PPO | Admitting: Orthopaedic Surgery

## 2020-10-09 ENCOUNTER — Encounter: Payer: Self-pay | Admitting: Orthopaedic Surgery

## 2020-10-09 VITALS — Ht 66.0 in | Wt 142.0 lb

## 2020-10-09 DIAGNOSIS — G8929 Other chronic pain: Secondary | ICD-10-CM

## 2020-10-09 DIAGNOSIS — M545 Low back pain, unspecified: Secondary | ICD-10-CM | POA: Diagnosis not present

## 2020-10-09 DIAGNOSIS — M25562 Pain in left knee: Secondary | ICD-10-CM

## 2020-10-09 DIAGNOSIS — M1712 Unilateral primary osteoarthritis, left knee: Secondary | ICD-10-CM

## 2020-10-09 MED ORDER — LIDOCAINE HCL 1 % IJ SOLN
2.0000 mL | INTRAMUSCULAR | Status: AC | PRN
  Administered 2020-10-09: 2 mL

## 2020-10-09 MED ORDER — BUPIVACAINE HCL 0.5 % IJ SOLN
2.0000 mL | INTRAMUSCULAR | Status: AC | PRN
  Administered 2020-10-09: 2 mL via INTRA_ARTICULAR

## 2020-10-09 MED ORDER — METHYLPREDNISOLONE ACETATE 40 MG/ML IJ SUSP
40.0000 mg | INTRAMUSCULAR | Status: AC | PRN
  Administered 2020-10-09: 40 mg via INTRA_ARTICULAR

## 2020-10-09 NOTE — Progress Notes (Signed)
Office Visit Note   Patient: Philip Winters           Date of Birth: 1960-06-15           MRN: [id number] Visit Date: 10/09/2020              Requested by: Antony Contras, MD [address] [place],  [place] [address] PCP: Antony Contras, MD   Assessment & Plan: Visit Diagnoses:  1. Chronic low back pain, unspecified back pain laterality, unspecified whether sciatica present     Plan: I aspirated 55 cc of inflammatory yellowish fluid and then injected cortisone as well.  Compression wrap applied.  Fluid sent to the lab.  We will see him back as needed.  Follow-Up Instructions: Return if symptoms worsen or fail to improve.   Orders:  Orders Placed This Encounter  Procedures  . Epidural Steroid Injection - Lumbar/Sacral (Ancillary Performed)   No orders of the defined types were placed in this encounter.     Procedures: Large Joint Inj: L knee on 10/09/2020 4:19 PM Details: 22 G needle Medications: 2 mL bupivacaine 0.5 %; 2 mL lidocaine 1 %; 40 mg methylPREDNISolone acetate 40 MG/ML Outcome: tolerated well, no immediate complications Patient was prepped and draped in the usual sterile fashion.       Clinical Data: No additional findings.   Subjective: Chief Complaint  Patient presents with  . Left Knee - Pain    Vester returns today for recurrent left knee effusion.  He had this aspirated and injected with Depo a month ago with Dr. Durward Fortes.  This showed 75 cc of pseudogout fluid.  He has had reaccumulation of the effusion would like to have it aspirated.   Review of Systems   Objective: Vital Signs: Ht 5\' 6"  (1.676 m)   Wt 142 lb (64.4 kg)   BMI 22.92 kg/m   Physical Exam  Ortho Exam Left knee shows a large joint effusion with painful range of motion is limited secondary to pain. Specialty Comments:  No specialty comments available.  Imaging: No results found.   PMFS History: Patient Active Problem List   Diagnosis Date Noted  .  Adhesive capsulitis of right shoulder 09/26/2020  . Carpal tunnel syndrome, bilateral upper limbs 09/26/2020  . Bilateral hand pain 09/20/2020  . Chronic left shoulder pain 08/23/2020  . Right arm pain 08/23/2020  . Cubital tunnel syndrome on left 08/23/2020  . Left ankle sprain 07/12/2020  . Visit for wound check 06/11/2020  . Change or removal of surgical wound dressing 05/24/2020  . Pulmonary nodule, left 05/23/2020  . Cellulitis 05/23/2020  . S/P CABG x 2 05/02/2020  . Coronary artery disease 05/01/2020  . Pulmonary nodule 1 cm or greater in diameter 04/26/2020  . Statin intolerance 04/20/2020  . Coronary atherosclerosis of native coronary artery 04/18/2020  . Abnormal cardiovascular stress test   . Nonrheumatic aortic valve stenosis   . Exertional dyspnea   . Stable angina pectoris ([place])   . Bicuspid aortic valve 04/06/2020  . Primary osteoarthritis of left knee 11/08/2019  . Bilateral primary osteoarthritis of knee 09/28/2019  . HNP (herniated nucleus pulposus), lumbar 09/29/2017  . Right carotid bruit 06/10/2017  . Paresthesia 06/10/2017  . Left wrist pain 10/09/2016  . Unilateral primary osteoarthritis, right knee 06/24/2016  . S/P total knee replacement using cement, right 06/24/2016  . Acute pain of left knee 03/27/2016  . Mixed hyperlipidemia 01/18/2007  . HYPERTENSION 01/18/2007   Past Medical  History:  Diagnosis Date  . Allergy   . Arthritis   . Coronary artery disease   . GERD (gastroesophageal reflux disease)    occ  . Heart murmur    baby  . High cholesterol   . Hypertension   . Pneumonia   . Right carotid bruit 06/10/2017  . Sciatica   . Seasonal allergies   . Sinus tachycardia   . Spinal stenosis     History reviewed. No pertinent family history.  Past Surgical History:  Procedure Laterality Date  . CARDIAC CATHETERIZATION    . CARPAL TUNNEL WITH CUBITAL TUNNEL Left 11/10/2013   Procedure: LEFT CARPAL TUNNEL RELEASE;  Surgeon: Cammie Sickle, MD;  Location: [facility];  Service: Orthopedics;  Laterality: Left;  . COLONOSCOPY    . CORONARY ARTERY BYPASS GRAFT N/A 05/01/2020   Procedure: CORONARY ARTERY BYPASS GRAFTING (CABG) X 2 ON CARDIOPULMONARY BYPASS. LIMA TO LAD, SVG TO DIAG;  Surgeon: Ivin Poot, MD;  Location: [facility];  Service: Open Heart Surgery;  Laterality: N/A;  . ENDOVEIN HARVEST OF GREATER SAPHENOUS VEIN Right 05/01/2020   Procedure: ENDOVEIN HARVEST OF GREATER SAPHENOUS VEIN;  Surgeon: Ivin Poot, MD;  Location: [facility];  Service: Open Heart Surgery;  Laterality: Right;  . KNEE ARTHROSCOPY  [id number]   left and right  . LUMBAR LAMINECTOMY/DECOMPRESSION MICRODISCECTOMY Left 09/29/2017   Procedure: LEFT LUMBAR TWO - LUMBAR THREELAMINOTOMY/MICRODISCECTOMY;  Surgeon: Jovita Gamma, MD;  Location: [facility];  Service: Neurosurgery;  Laterality: Left;  LEFT LUMBAR 2- LUMBAR 3 LAMINOTOMY/MICRODISCECTOMY  . NASAL SINUS SURGERY  05/2015  . RIGHT/LEFT HEART CATH AND CORONARY ANGIOGRAPHY N/A 04/10/2020   Procedure: RIGHT/LEFT HEART CATH AND CORONARY ANGIOGRAPHY;  Surgeon: Troy Sine, MD;  Location: [facility];  Service: Cardiovascular;  Laterality: N/A;  . TEE WITHOUT CARDIOVERSION N/A 05/01/2020   Procedure: TRANSESOPHAGEAL ECHOCARDIOGRAM (TEE);  Surgeon: Prescott Gum, Collier Salina, MD;  Location: [facility];  Service: Open Heart Surgery;  Laterality: N/A;  . TOTAL KNEE ARTHROPLASTY Right 06/24/2016   Procedure: TOTAL KNEE ARTHROPLASTY;  Surgeon: Garald Balding, MD;  Location: [facility];  Service: Orthopedics;  Laterality: Right;  . TRIGGER FINGER RELEASE Left 11/10/2013   Procedure: LEFT INDEX A-1 PULLEY RELEASE;  Surgeon: Cammie Sickle, MD;  Location: [facility];  Service: Orthopedics;  Laterality: Left;  . ULNAR NERVE TRANSPOSITION Left 11/10/2013   Procedure: ULNAR NERVE TRANSPOSITION;  Surgeon: Cammie Sickle, MD;  Location: [facility];  Service: Orthopedics;   Laterality: Left;   Social History   Occupational History  . Occupation: Self employed  Tobacco Use  . Smoking status: Former Smoker    Packs/day: 1.00    Years: 37.00    Pack years: 37.00    Types: Cigarettes    Quit date: 05/04/2016    Years since quitting: 4.4  . Smokeless tobacco: Never Used  Vaping Use  . Vaping Use: Former  Substance and Sexual Activity  . Alcohol use: Yes    Alcohol/week: 3.0 standard drinks    Types: 3 Glasses of wine per week    Comment: weekends only with dinner  . Drug use: No  . Sexual activity: Not on file

## 2020-10-10 LAB — SYNOVIAL FLUID ANALYSIS, COMPLETE
Basophils, %: 0 %
Eosinophils-Synovial: 0 % (ref 0–2)
Lymphocytes-Synovial Fld: 20 % (ref 0–74)
Monocyte/Macrophage: 20 % (ref 0–69)
Neutrophil, Synovial: 60 % — ABNORMAL HIGH (ref 0–24)
Synoviocytes, %: 0 % (ref 0–15)
WBC, Synovial: 14580 cells/uL — ABNORMAL HIGH (ref <150)

## 2020-10-16 ENCOUNTER — Telehealth: Payer: Self-pay

## 2020-10-16 MED ORDER — PREDNISONE 50 MG PO TABS: ORAL_TABLET | ORAL | 0 refills | Status: DC

## 2020-10-16 NOTE — Telephone Encounter (Signed)
Phone call to patient to review instructions for 13 hr prep for epidural steroid injection on 10/17/20  at 7:30 AM. Prescription called into [facility]. Pt aware and verbalized understanding of instructions. Prescription:  Pt to take 50 mg of prednisone on 10/16/20 at 6:30 PM, 50 mg of prednisone on 10/17/20 at 12:30 AM, and 50 mg of prednisone on 10/17/20 at 6:30 AM. Pt is also to take 50 mg of benadryl on 10/17/20 at 6:30 AM. Please call [phone] with any questions.  Benadryl was not called in as a prescription as the patient reports he has benadryl at home. Pt verbalized understanding of when to take 50 mg of benadryl for his 13 hr prep. Pt also advised to have a driver the day of taking these medications as they may cause drowsiness.

## 2020-10-16 NOTE — Telephone Encounter (Signed)
I have a surgery sheet for this patient from Dr. Durward Fortes for a closed manipulation of the right shoulder.  Patient would like to go ahead and secure a date of July 12th to have left knee replacement. Patient stated he would like to put the shoulder off for now since he has had to have the knee drained again.  If this arrangement of knee before shoulder is advisable per Dr. Durward Fortes, please provide surgery sheet for the knee.

## 2020-10-17 ENCOUNTER — Encounter: Payer: Self-pay | Admitting: Cardiology

## 2020-10-17 ENCOUNTER — Ambulatory Visit
Admission: RE | Admit: 2020-10-17 | Discharge: 2020-10-17 | Disposition: A | Discharge status: Home / Self Care | Payer: BC Managed Care – PPO | Source: Ambulatory Visit | Attending: Orthopaedic Surgery | Admitting: Orthopaedic Surgery

## 2020-10-17 ENCOUNTER — Ambulatory Visit: Payer: BC Managed Care – PPO | Admitting: Cardiology

## 2020-10-17 ENCOUNTER — Other Ambulatory Visit: Payer: Self-pay

## 2020-10-17 VITALS — BP 120/70 | HR 112 | Ht 66.0 in | Wt 149.0 lb

## 2020-10-17 DIAGNOSIS — I25110 Atherosclerotic heart disease of native coronary artery with unstable angina pectoris: Secondary | ICD-10-CM | POA: Diagnosis not present

## 2020-10-17 DIAGNOSIS — Z789 Other specified health status: Secondary | ICD-10-CM | POA: Diagnosis not present

## 2020-10-17 DIAGNOSIS — M48061 Spinal stenosis, lumbar region without neurogenic claudication: Secondary | ICD-10-CM | POA: Diagnosis not present

## 2020-10-17 DIAGNOSIS — G8929 Other chronic pain: Secondary | ICD-10-CM

## 2020-10-17 DIAGNOSIS — M47817 Spondylosis without myelopathy or radiculopathy, lumbosacral region: Secondary | ICD-10-CM | POA: Diagnosis not present

## 2020-10-17 IMAGING — XA Imaging study
2 series · 2 of 2 positions shown · non-contrast
Comparison: none

CLINICAL DATA: Lumbosacral spondylosis without myelopathy. Low back
and posterior left leg pain. Advanced spinal stenosis at L3-4.

[Series 1: ortho standard · 1 of 1 slices shown (1 of 2)]
[im 1/1]
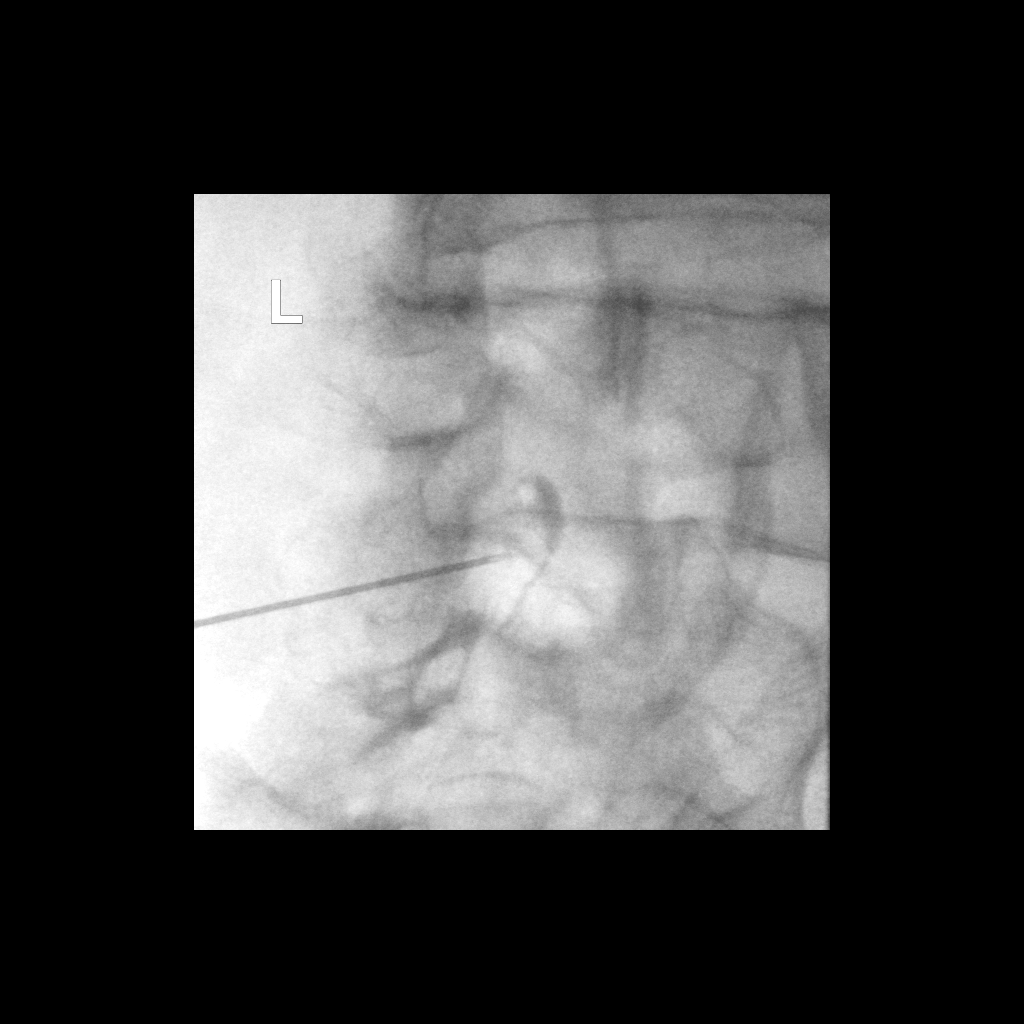

[Series 2: ortho standard · 1 of 1 slices shown (2 of 2)]
[im 1/1]
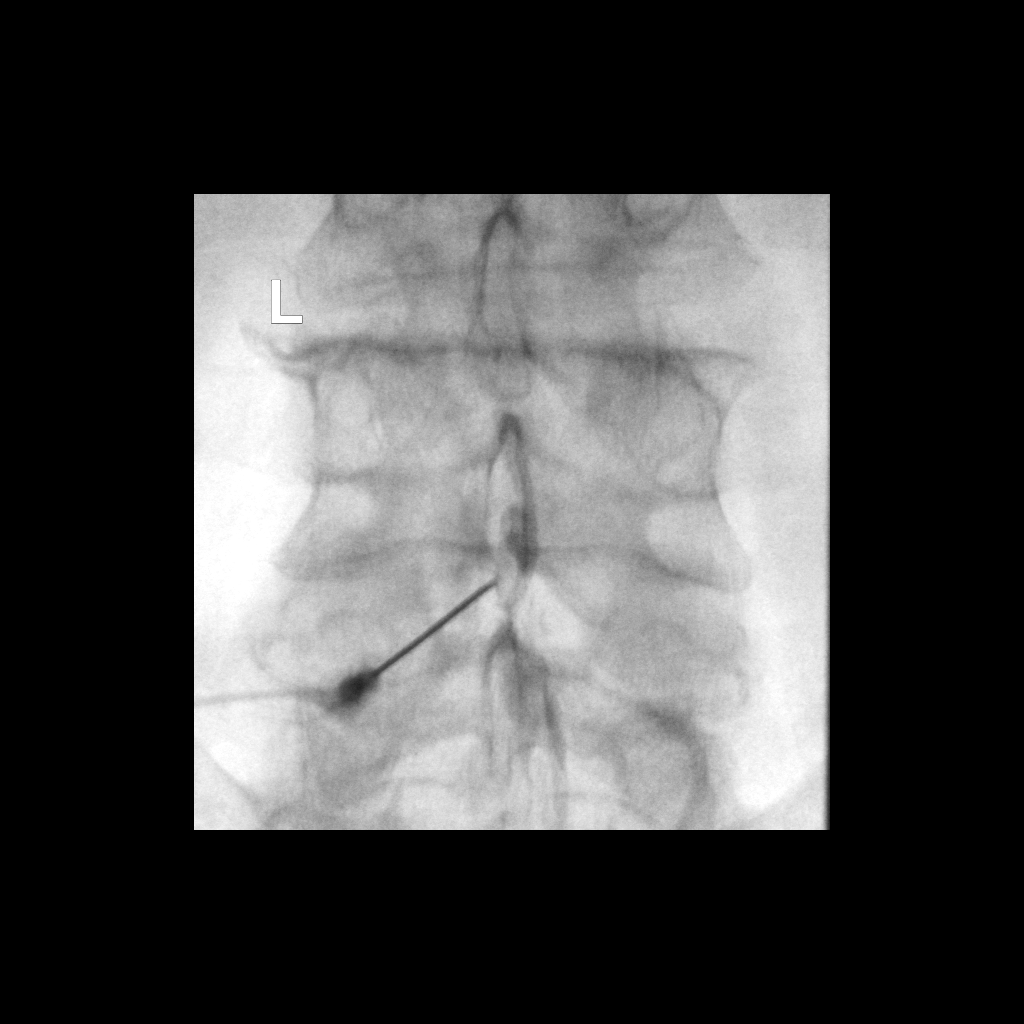

[2 of 2 positions shown; findings below may reference images not displayed]

FLUOROSCOPY TIME:  Fluoroscopy Time: 12 seconds

Radiation Exposure Index: 13.08 microGray*m^2

PROCEDURE:
The procedure, risks, benefits, and alternatives were explained to
the patient. Questions regarding the procedure were encouraged and
answered. The patient understands and consents to the procedure.

LUMBAR EPIDURAL INJECTION:

An interlaminar approach was performed on the left at L4-5. The
overlying skin was cleansed and anesthetized. A 20 gauge epidural
needle was advanced using loss-of-resistance technique.

DIAGNOSTIC EPIDURAL INJECTION:

Injection of Isovue-M 200 shows a good epidural pattern with spread
above and below the level of needle placement bilaterally. No
vascular opacification is seen.

THERAPEUTIC EPIDURAL INJECTION:

80 mg of Depo-Medrol mixed with 3 mL of 1% lidocaine were instilled.
The procedure was well-tolerated, and the patient was discharged
thirty minutes following the injection in good condition.

COMPLICATIONS:
None immediate
IMPRESSION: Technically successful interlaminar epidural injection on the left
at L4-5.

## 2020-10-17 MED ORDER — METHYLPREDNISOLONE ACETATE 40 MG/ML INJ SUSP (RADIOLOG
80.0000 mg | Freq: Once | INTRAMUSCULAR | Status: AC
  Administered 2020-10-17: 80 mg via EPIDURAL

## 2020-10-17 MED ORDER — IOPAMIDOL (ISOVUE-M 200) INJECTION 41%
1.0000 mL | Freq: Once | INTRAMUSCULAR | Status: AC
  Administered 2020-10-17: 1 mL via EPIDURAL

## 2020-10-17 NOTE — Progress Notes (Signed)
Cardiology Office Note:    Date:  10/17/2020   ID:  Philip Winters, DOB 1960/11/20, MRN [id number]  PCP:  Antony Contras, MD   [facility] Providers Cardiologist:  Candee Furbish, MD     Referring MD: Antony Contras, MD     History of Present Illness:    Philip Winters is a 60 y.o. male here for follow-up post CABG coronary artery disease.    CABG x2 LIMA to LAD (90% proximal LAD) and SVG to diagonal on 05/01/2021 by Dr. Darcey Nora.High risk nuclear stress test.  Had extensive anginal symptoms prior. 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  Has bicuspid aortic valve, 4.1 cm aortic root. 1.5 cm infiltrative density in the superior segment of the left upper lobe which will need to be followed.  Had interesting bilateral hand swelling left greater than right, puffiness that lasted about 2 days.  This subsided.  Dr. Moreen Fowler was able to check some blood work and evaluate.  No chest pain, no significant shortness of breath.  Wearing his Popeye shirt.  See below for details.   Past Medical History:  Diagnosis Date  . Allergy   . Arthritis   . Coronary artery disease   . GERD (gastroesophageal reflux disease)    occ  . Heart murmur    baby  . High cholesterol   . Hypertension   . Pneumonia   . Right carotid bruit 06/10/2017  . Sciatica   . Seasonal allergies   . Sinus tachycardia   . Spinal stenosis     Past Surgical History:  Procedure Laterality Date  . CARDIAC CATHETERIZATION    . CARPAL TUNNEL WITH CUBITAL TUNNEL Left 11/10/2013   Procedure: LEFT CARPAL TUNNEL RELEASE;  Surgeon: Cammie Sickle, MD;  Location: [place];  Service: Orthopedics;  Laterality: Left;  . COLONOSCOPY    . CORONARY ARTERY BYPASS GRAFT N/A 05/01/2020   Procedure: CORONARY ARTERY BYPASS GRAFTING (CABG) X 2 ON CARDIOPULMONARY BYPASS. LIMA TO LAD, SVG TO DIAG;  Surgeon: Ivin Poot, MD;  Location: [place];  Service: Open Heart Surgery;   Laterality: N/A;  . ENDOVEIN HARVEST OF GREATER SAPHENOUS VEIN Right 05/01/2020   Procedure: ENDOVEIN HARVEST OF GREATER SAPHENOUS VEIN;  Surgeon: Ivin Poot, MD;  Location: [place];  Service: Open Heart Surgery;  Laterality: Right;  . KNEE ARTHROSCOPY  [id number]   left and right  . LUMBAR LAMINECTOMY/DECOMPRESSION MICRODISCECTOMY Left 09/29/2017   Procedure: LEFT LUMBAR TWO - LUMBAR THREELAMINOTOMY/MICRODISCECTOMY;  Surgeon: Jovita Gamma, MD;  Location: [place];  Service: Neurosurgery;  Laterality: Left;  LEFT LUMBAR 2- LUMBAR 3 LAMINOTOMY/MICRODISCECTOMY  . NASAL SINUS SURGERY  05/2015  . RIGHT/LEFT HEART CATH AND CORONARY ANGIOGRAPHY N/A 04/10/2020   Procedure: RIGHT/LEFT HEART CATH AND CORONARY ANGIOGRAPHY;  Surgeon: Troy Sine, MD;  Location: [place] [facility];  Service: Cardiovascular;  Laterality: N/A;  . TEE WITHOUT CARDIOVERSION N/A 05/01/2020   Procedure: TRANSESOPHAGEAL ECHOCARDIOGRAM (TEE);  Surgeon: Prescott Gum, Collier Salina, MD;  Location: [place];  Service: Open Heart Surgery;  Laterality: N/A;  . TOTAL KNEE ARTHROPLASTY Right 06/24/2016   Procedure: TOTAL KNEE ARTHROPLASTY;  Surgeon: Garald Balding, MD;  Location: [place];  Service: Orthopedics;  Laterality: Right;  . TRIGGER FINGER RELEASE Left 11/10/2013   Procedure: LEFT INDEX A-1 PULLEY RELEASE;  Surgeon: Cammie Sickle, MD;  Location: [place];  Service: Orthopedics;  Laterality: Left;  . ULNAR NERVE TRANSPOSITION Left  11/10/2013   Procedure: ULNAR NERVE TRANSPOSITION;  Surgeon: Cammie Sickle, MD;  Location: [place];  Service: Orthopedics;  Laterality: Left;    Current Medications: Current Meds  Medication Sig  . aspirin EC 81 MG tablet Take 1 tablet (81 mg total) by mouth daily. Swallow whole.  . diclofenac Sodium (VOLTAREN) 1 % GEL Apply 2 g topically daily as needed (Knee and back pain).  Marland Kitchen diltiazem (CARDIZEM CD) 240 MG 24 hr capsule Take 1 capsule (240 mg total) by mouth  daily.  . diphenhydrAMINE (BENADRYL) 25 MG tablet Take 12.5 mg by mouth at bedtime.  . Evolocumab (REPATHA SURECLICK) 099 MG/ML SOAJ Inject 1 pen into the skin every 14 (fourteen) days.  . fluticasone (FLONASE) 50 MCG/ACT nasal spray Place 2 sprays into both nostrils daily as needed for allergies.   . Multiple Vitamin (MULTIVITAMIN WITH MINERALS) TABS tablet Take 1 tablet by mouth daily.  . naproxen sodium (ALEVE) 220 MG tablet Take 220 mg by mouth daily as needed.  Marland Kitchen tetrahydrozoline 0.05 % ophthalmic solution Place 1 drop into both eyes 3 (three) times daily as needed (for dry/irritated eyes.).  Marland Kitchen traMADol (ULTRAM) 50 MG tablet Take 50 mg by mouth every 6 (six) hours as needed.     Allergies:   Mushroom extract complex, Penicillins, Atorvastatin, Chantix [varenicline], Dilaudid [hydromorphone], Losartan potassium, Pravastatin, Rosuvastatin, Bystolic [nebivolol hcl], Codeine, Iodine, Iohexol, and Metoprolol   Social History   Socioeconomic History  . Marital status: Married    Spouse name: [name]  . Number of children: 0  . Years of education: 9  . Highest education level: Not on file  Occupational History  . Occupation: Self employed  Tobacco Use  . Smoking status: Former Smoker    Packs/day: 1.00    Years: 37.00    Pack years: 37.00    Types: Cigarettes    Quit date: 05/04/2016    Years since quitting: 4.4  . Smokeless tobacco: Never Used  Vaping Use  . Vaping Use: Former  Substance and Sexual Activity  . Alcohol use: Yes    Alcohol/week: 3.0 standard drinks    Types: 3 Glasses of wine per week    Comment: weekends only with dinner  . Drug use: No  . Sexual activity: Not on file  Other Topics Concern  . Not on file  Social History Narrative   Lives with wife, [name]   Caffeine use: Coffee daily   Right handed    Social Determinants of Health   Financial Resource Strain: Not on file  Food Insecurity: Not on file  Transportation Needs: Not on file  Physical  Activity: Not on file  Stress: Not on file  Social Connections: Not on file     Family History: The patient's family history is not on file.  ROS:   Please see the history of present illness.     All other systems reviewed and are negative.  EKGs/Labs/Other Studies Reviewed:    The following studies were reviewed today:   1. Left ventricular ejection fraction, by estimation, is 60 to 65%. The  left ventricle has normal function. The left ventricle has no regional  wall motion abnormalities. Left ventricular diastolic parameters are  consistent with Grade I diastolic  dysfunction (impaired relaxation).  2. Right ventricular systolic function is normal. The right ventricular  size is normal.  3. The mitral valve is normal in structure. No evidence of mitral valve  regurgitation.  4. The aortic valve is  calcified. There is moderate calcification of the  aortic valve. There is moderate thickening of the aortic valve. Aortic  valve regurgitation is not visualized. Mild aortic valve stenosis.  5. Aortic dilatation noted. There is mild to moderate dilatation of the  ascending aorta, measuring 40 mm.     Recent Labs: 05/02/2020: Magnesium 2.2 05/06/2020: Hemoglobin 11.9; Platelets 241 05/07/2020: BUN 12; Creatinine, Ser 0.67; Potassium 3.8; Sodium 126 10/03/2020: ALT 16  Recent Lipid Panel    Component Value Date/Time   CHOL 105 10/03/2020 0822   TRIG 103 10/03/2020 0822   HDL 58 10/03/2020 0822   CHOLHDL 1.8 10/03/2020 0822   LDLCALC 28 10/03/2020 0822     Risk Assessment/Calculations:      Physical Exam:    VS:  BP 120/70 (BP Location: Left Arm, Patient Position: Sitting, Cuff Size: Normal)   Pulse (!) 112   Ht 5\' 6"  (1.676 m)   Wt 149 lb (67.6 kg)   SpO2 97%   BMI 24.05 kg/m     Wt Readings from Last 3 Encounters:  10/17/20 149 lb (67.6 kg)  10/09/20 142 lb (64.4 kg)  09/20/20 142 lb 9.6 oz (64.7 kg)     GEN:  Well nourished, well developed in no  acute distress HEENT: Normal NECK: No JVD; No carotid bruits LYMPHATICS: No lymphadenopathy CARDIAC: RRR, 2/6 SM, rubs, gallops RESPIRATORY:  Clear to auscultation without rales, wheezing or rhonchi  ABDOMEN: Soft, non-tender, non-distended MUSCULOSKELETAL:  No edema; No deformity  SKIN: Warm and dry NEUROLOGIC:  Alert and oriented x 3 PSYCHIATRIC:  Normal affect   ASSESSMENT:    No diagnosis found. PLAN:    In order of problems listed above:  Coronary disease post CABG - Dr. Kipp Brood.  Progressing well.  Continue with goal-directed medical therapy.  Bicuspid aortic valve with mild aortic stenosis - Did not require replacement at time of surgery.  Mean gradient 10 mmHg personally reviewed on echocardiogram.  Statin intolerance with hyperlipidemia - Was taking PCSK9 inhibitor Repatha.  He ended up stopping this about 2 weeks ago because of hip pain.  His hip pain has improved somewhat but is still present.  I have asked him to reach out to our lipid clinic.  Obviously there are alternatives.  This seemed to really help him with his cholesterol.  I expressed the importance of low LDL from a morbidity/mortality perspective.  Superior segment of left lower lobe 15 x 11 mm solid nodule followed by surgery  Left knee arthritis - Dr. Durward Fortes.  Has been getting this drained frequently.  Needs replacement.  Since he is close to 6 months post CABG, I think it is reasonable for her to proceed.  He has discussed this previously with Dr. Kipp Brood and he agrees.  Optimally, we like to wait 1 year for elective surgeries but in his case this seems reasonable.  If he can be operated on while taking aspirin that would be great.  Right frozen shoulder - Dr. Durward Fortes should be able to manipulate this he states under anesthesia.    Left knee - ok for or  Stopped Repatha - better after stopping. 2 weeks  [place] Frozen Knee.      Medication Adjustments/Labs and Tests Ordered: Current  medicines are reviewed at length with the patient today.  Concerns regarding medicines are outlined above.  No orders of the defined types were placed in this encounter.  No orders of the defined types were placed in this encounter.   There are  no Patient Instructions on file for this visit.   Signed, Candee Furbish, MD  10/17/2020 12:00 PM    [place]

## 2020-10-17 NOTE — Patient Instructions (Signed)
Medication Instructions:  The current medical regimen is effective;  continue present plan and medications.  *If you need a refill on your cardiac medications before your next appointment, please call your pharmacy*  Follow-Up: At [facility], you and your health needs are our priority.  As part of our continuing mission to provide you with exceptional heart care, we have created designated Provider Care Teams.  These Care Teams include your primary Cardiologist (physician) and Advanced Practice Providers (APPs -  Physician Assistants and Nurse Practitioners) who all work together to provide you with the care you need, when you need it.  We recommend signing up for the patient portal called "MyChart".  Sign up information is provided on this After Visit Summary.  MyChart is used to connect with patients for Virtual Visits (Telemedicine).  Patients are able to view lab/test results, encounter notes, upcoming appointments, etc.  Non-urgent messages can be sent to your provider as well.   To learn more about what you can do with MyChart, go to [email or web address].    Your next appointment:   1 year(s)  The format for your next appointment:   In Person  Provider:   Mark Skains, MD   Thank you for choosing [facility]!!    

## 2020-10-17 NOTE — Discharge Instructions (Signed)
Post Procedure Spinal Discharge Instruction Sheet  1. You may resume a regular diet and any medications that you routinely take (including pain medications).  2. No driving day of procedure.  3. Light activity throughout the rest of the day.  Do not do any strenuous work, exercise, bending or lifting.  The day following the procedure, you can resume normal physical activity but you should refrain from exercising or physical therapy for at least three days thereafter.   Common Side Effects:   Headaches- take your usual medications as directed by your physician.  Increase your fluid intake.  Caffeinated beverages may be helpful.  Lie flat in bed until your headache resolves.   Restlessness or inability to sleep- you may have trouble sleeping for the next few days.  Ask your referring physician if you need any medication for sleep.   Facial flushing or redness- should subside within a few days.   Increased pain- a temporary increase in pain a day or two following your procedure is not unusual.  Take your pain medication as prescribed by your referring physician.   Leg cramps  Please contact our office at [phone] for the following symptoms:  Fever greater than 100 degrees.  Headaches unresolved with medication after 2-3 days.  Increased swelling, pain, or redness at injection site.   Thank you for visiting [facility] today.   YOU MAY RESUME YOUR ASPRIN ANYTIME AFTER INJECTION TODAY 10/17/20

## 2020-10-20 NOTE — Telephone Encounter (Signed)
As above.

## 2020-10-20 NOTE — Telephone Encounter (Signed)
Will complete surgery sheet next week

## 2020-10-25 DIAGNOSIS — M7501 Adhesive capsulitis of right shoulder: Secondary | ICD-10-CM | POA: Diagnosis not present

## 2020-11-01 ENCOUNTER — Ambulatory Visit (INDEPENDENT_AMBULATORY_CARE_PROVIDER_SITE_OTHER): Payer: BC Managed Care – PPO | Admitting: Orthopaedic Surgery

## 2020-11-01 ENCOUNTER — Other Ambulatory Visit: Payer: Self-pay

## 2020-11-01 ENCOUNTER — Encounter: Payer: Self-pay | Admitting: Orthopaedic Surgery

## 2020-11-01 VITALS — Ht 66.0 in | Wt 149.0 lb

## 2020-11-01 DIAGNOSIS — M7501 Adhesive capsulitis of right shoulder: Secondary | ICD-10-CM

## 2020-11-01 NOTE — Progress Notes (Signed)
Office Visit Note   Patient: Philip Winters           Date of Birth: March 31, 1961           MRN: [id number] Visit Date: 11/01/2020              Requested by: Antony Contras, MD [place],   [id number] PCP: Antony Contras, MD   Assessment & Plan: Visit Diagnoses:  1. Adhesive capsulitis of right shoulder     Plan: 1 week status post closed manipulation of adhesive capsulitis right shoulder doing well.  Still has a little loss of overhead flexion I had him full overhead flexion after the manipulation but he still little bit tight but not having any appreciable pain.  Will urge him to continue with his range of motion exercises.  He is in the midst of getting a left total knee replacement scheduled awaiting clearance forms  Follow-Up Instructions: Return if symptoms worsen or fail to improve.   Orders:  No orders of the defined types were placed in this encounter.  No orders of the defined types were placed in this encounter.     Procedures: No procedures performed   Clinical Data: No additional findings.   Subjective: Chief Complaint  Patient presents with   Right Shoulder - Follow-up    Closed manipulation 10/25/2020  Patient presents today for follow up on his right shoulder. He had closed manipulation on 10/25/2020. Patient states that he can move his arm, but he is having a lot of pain at night.  HPI  Review of Systems   Objective: Vital Signs: Ht 5\' 6"  (1.676 m)   Wt 149 lb (67.6 kg)   BMI 24.05 kg/m   Physical Exam  Ortho Exam awake alert and oriented x3.  Comfortable sitting.  Lacks, perhaps, 30 degrees to full overhead motion but considerably better than his preoperative status.  He can take his left hand and place his right hand in the middle of his back.  He can abduct 90 degrees.  Neurologically intact  Specialty Comments:  No specialty comments available.  Imaging: No results found.   PMFS History: Patient Active Problem  List   Diagnosis Date Noted   Adhesive capsulitis of right shoulder 09/26/2020   Carpal tunnel syndrome, bilateral upper limbs 09/26/2020   Bilateral hand pain 09/20/2020   Chronic left shoulder pain 08/23/2020   Right arm pain 08/23/2020   Cubital tunnel syndrome on left 08/23/2020   Left ankle sprain 07/12/2020   Visit for wound check 06/11/2020   Change or removal of surgical wound dressing 05/24/2020   Pulmonary nodule, left 05/23/2020   Cellulitis 05/23/2020   S/P CABG x 2 05/02/2020   Coronary artery disease 05/01/2020   Pulmonary nodule 1 cm or greater in diameter 04/26/2020   Statin intolerance 04/20/2020   Coronary atherosclerosis of native coronary artery 04/18/2020   Abnormal cardiovascular stress test    Nonrheumatic aortic valve stenosis    Exertional dyspnea    Stable angina pectoris (HCC)    Bicuspid aortic valve 04/06/2020   Primary osteoarthritis of left knee 11/08/2019   Bilateral primary osteoarthritis of knee 09/28/2019   HNP (herniated nucleus pulposus), lumbar 09/29/2017   Right carotid bruit 06/10/2017   Paresthesia 06/10/2017   Left wrist pain 10/09/2016   Unilateral primary osteoarthritis, right knee 06/24/2016   S/P total knee replacement using cement, right 06/24/2016   Acute pain of left knee 03/27/2016   Mixed  hyperlipidemia 01/18/2007   HYPERTENSION 01/18/2007   Past Medical History:  Diagnosis Date   Allergy    Arthritis    Coronary artery disease    GERD (gastroesophageal reflux disease)    occ   Heart murmur    baby   High cholesterol    Hypertension    Pneumonia    Right carotid bruit 06/10/2017   Sciatica    Seasonal allergies    Sinus tachycardia    Spinal stenosis     History reviewed. No pertinent family history.  Past Surgical History:  Procedure Laterality Date   CARDIAC CATHETERIZATION     CARPAL TUNNEL WITH CUBITAL TUNNEL Left 11/10/2013   Procedure: LEFT CARPAL TUNNEL RELEASE;  Surgeon: Cammie Sickle, MD;   Location: [place];  Service: Orthopedics;  Laterality: Left;   COLONOSCOPY     CORONARY ARTERY BYPASS GRAFT N/A 05/01/2020   Procedure: CORONARY ARTERY BYPASS GRAFTING (CABG) X 2 ON CARDIOPULMONARY BYPASS. LIMA TO LAD, SVG TO DIAG;  Surgeon: Ivin Poot, MD;  Location: [place];  Service: Open Heart Surgery;  Laterality: N/A;   ENDOVEIN HARVEST OF GREATER SAPHENOUS VEIN Right 05/01/2020   Procedure: ENDOVEIN HARVEST OF GREATER SAPHENOUS VEIN;  Surgeon: Ivin Poot, MD;  Location: [place];  Service: Open Heart Surgery;  Laterality: Right;   KNEE ARTHROSCOPY  [id number]   left and right   LUMBAR LAMINECTOMY/DECOMPRESSION MICRODISCECTOMY Left 09/29/2017   Procedure: LEFT LUMBAR TWO - LUMBAR THREELAMINOTOMY/MICRODISCECTOMY;  Surgeon: Jovita Gamma, MD;  Location: [place];  Service: Neurosurgery;  Laterality: Left;  LEFT LUMBAR 2- LUMBAR 3 LAMINOTOMY/MICRODISCECTOMY   NASAL SINUS SURGERY  05/2015   RIGHT/LEFT HEART CATH AND CORONARY ANGIOGRAPHY N/A 04/10/2020   Procedure: RIGHT/LEFT HEART CATH AND CORONARY ANGIOGRAPHY;  Surgeon: Troy Sine, MD;  Location: [place] [facility];  Service: Cardiovascular;  Laterality: N/A;   TEE WITHOUT CARDIOVERSION N/A 05/01/2020   Procedure: TRANSESOPHAGEAL ECHOCARDIOGRAM (TEE);  Surgeon: Prescott Gum, Collier Salina, MD;  Location: [place];  Service: Open Heart Surgery;  Laterality: N/A;   TOTAL KNEE ARTHROPLASTY Right 06/24/2016   Procedure: TOTAL KNEE ARTHROPLASTY;  Surgeon: Garald Balding, MD;  Location: [place];  Service: Orthopedics;  Laterality: Right;   TRIGGER FINGER RELEASE Left 11/10/2013   Procedure: LEFT INDEX A-1 PULLEY RELEASE;  Surgeon: Cammie Sickle, MD;  Location: [place];  Service: Orthopedics;  Laterality: Left;   ULNAR NERVE TRANSPOSITION Left 11/10/2013   Procedure: ULNAR NERVE TRANSPOSITION;  Surgeon: Cammie Sickle, MD;  Location: [place];  Service: Orthopedics;  Laterality: Left;   Social  History   Occupational History   Occupation: Self employed  Tobacco Use   Smoking status: Former    Packs/day: 1.00    Years: 37.00    Pack years: 37.00    Types: Cigarettes    Quit date: 05/04/2016    Years since quitting: 4.4   Smokeless tobacco: Never  Vaping Use   Vaping Use: Former  Substance and Sexual Activity   Alcohol use: Yes    Alcohol/week: 3.0 standard drinks    Types: 3 Glasses of wine per week    Comment: weekends only with dinner   Drug use: No   Sexual activity: Not on file

## 2020-11-07 ENCOUNTER — Other Ambulatory Visit: Payer: Self-pay | Admitting: Physician Assistant

## 2020-11-07 DIAGNOSIS — K760 Fatty (change of) liver, not elsewhere classified: Secondary | ICD-10-CM | POA: Diagnosis not present

## 2020-11-07 DIAGNOSIS — R932 Abnormal findings on diagnostic imaging of liver and biliary tract: Secondary | ICD-10-CM | POA: Diagnosis not present

## 2020-11-07 DIAGNOSIS — Z1211 Encounter for screening for malignant neoplasm of colon: Secondary | ICD-10-CM | POA: Diagnosis not present

## 2020-11-07 DIAGNOSIS — R748 Abnormal levels of other serum enzymes: Secondary | ICD-10-CM

## 2020-11-09 ENCOUNTER — Other Ambulatory Visit: Payer: BC Managed Care – PPO

## 2020-11-09 ENCOUNTER — Ambulatory Visit: Payer: BC Managed Care – PPO | Admitting: Thoracic Surgery (Cardiothoracic Vascular Surgery)

## 2020-11-12 ENCOUNTER — Telehealth: Payer: Self-pay | Admitting: *Deleted

## 2020-11-12 NOTE — Telephone Encounter (Signed)
   [facility] Pre-operative Risk Assessment    Patient Name: Philip Winters  DOB: 01/27/1961  MRN: [id number]   HEARTCARE STAFF: - Please ensure there is not already an duplicate clearance open for this procedure. - Under Visit Info/Reason for Call, type in Other and utilize the format Clearance MM/DD/YY or Clearance TBD. Do not use dashes or single digits. - If request is for dental extraction, please clarify the # of teeth to be extracted. - If the patient is currently at the dentist's office, call Pre-Op APP to address. If the patient is not currently in the dentist office, please route to the Pre-Op pool  Request for surgical clearance:  What type of surgery is being performed? LEFT TOTAL KNEE   When is this surgery scheduled? TBD   What type of clearance is required (medical clearance vs. Pharmacy clearance to hold med vs. Both)? MEDICAL  Are there any medications that need to be held prior to surgery and how long? ASA    Practice name and name of physician performing surgery? [facility]; DR. Joni Fears   What is the office phone number? [phone]   7.   What is the office fax number? [phone]  8.   Anesthesia type (None, local, MAC, general) ? NOT LISTED   Julaine Hua 11/12/2020, 5:01 PM  _________________________________________________________________   (provider comments below)

## 2020-11-16 ENCOUNTER — Ambulatory Visit: Payer: BC Managed Care – PPO | Admitting: Thoracic Surgery (Cardiothoracic Vascular Surgery)

## 2020-11-16 ENCOUNTER — Other Ambulatory Visit: Payer: BC Managed Care – PPO

## 2020-11-16 DIAGNOSIS — M1712 Unilateral primary osteoarthritis, left knee: Secondary | ICD-10-CM | POA: Diagnosis not present

## 2020-11-16 DIAGNOSIS — M25562 Pain in left knee: Secondary | ICD-10-CM | POA: Diagnosis not present

## 2020-11-16 DIAGNOSIS — Z01818 Encounter for other preprocedural examination: Secondary | ICD-10-CM | POA: Diagnosis not present

## 2020-11-16 DIAGNOSIS — R7303 Prediabetes: Secondary | ICD-10-CM | POA: Diagnosis not present

## 2020-11-20 ENCOUNTER — Ambulatory Visit
Admission: RE | Admit: 2020-11-20 | Discharge: 2020-11-20 | Disposition: A | Discharge status: Home / Self Care | Payer: BC Managed Care – PPO | Source: Ambulatory Visit | Attending: Family Medicine | Admitting: Family Medicine

## 2020-11-20 ENCOUNTER — Other Ambulatory Visit: Payer: Self-pay | Admitting: Family Medicine

## 2020-11-20 DIAGNOSIS — Z01818 Encounter for other preprocedural examination: Secondary | ICD-10-CM

## 2020-11-20 DIAGNOSIS — Z951 Presence of aortocoronary bypass graft: Secondary | ICD-10-CM | POA: Diagnosis not present

## 2020-11-20 IMAGING — CR DG CHEST 2V
2 series · 2 of 2 positions shown · non-contrast
Comparison: Chest radiographs [DATE] and CT [DATE]

CLINICAL DATA: Preop for knee surgery. History of CABG and smoking.

EXAM:
CHEST - 2 VIEW

[w chest pa]
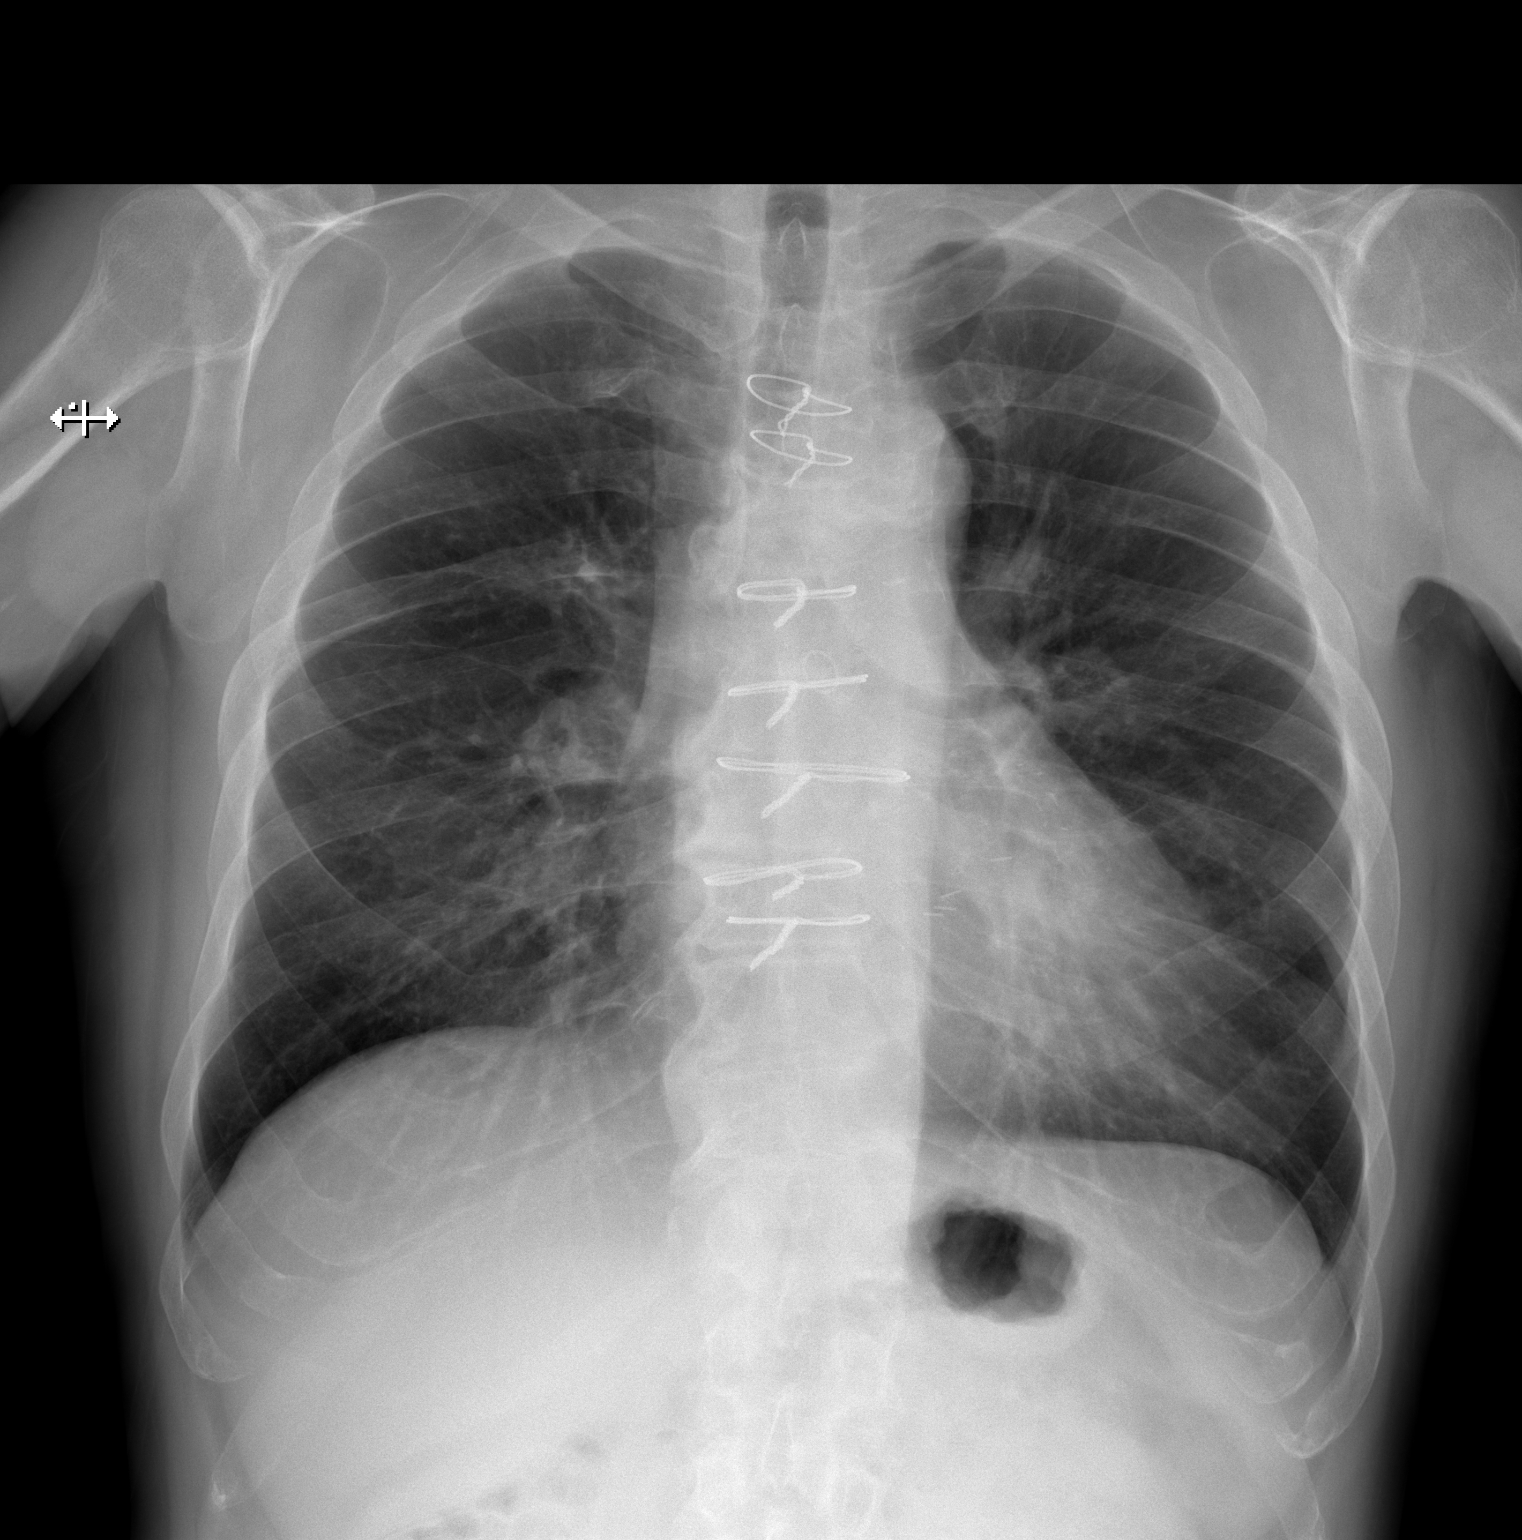

[w chest lat]
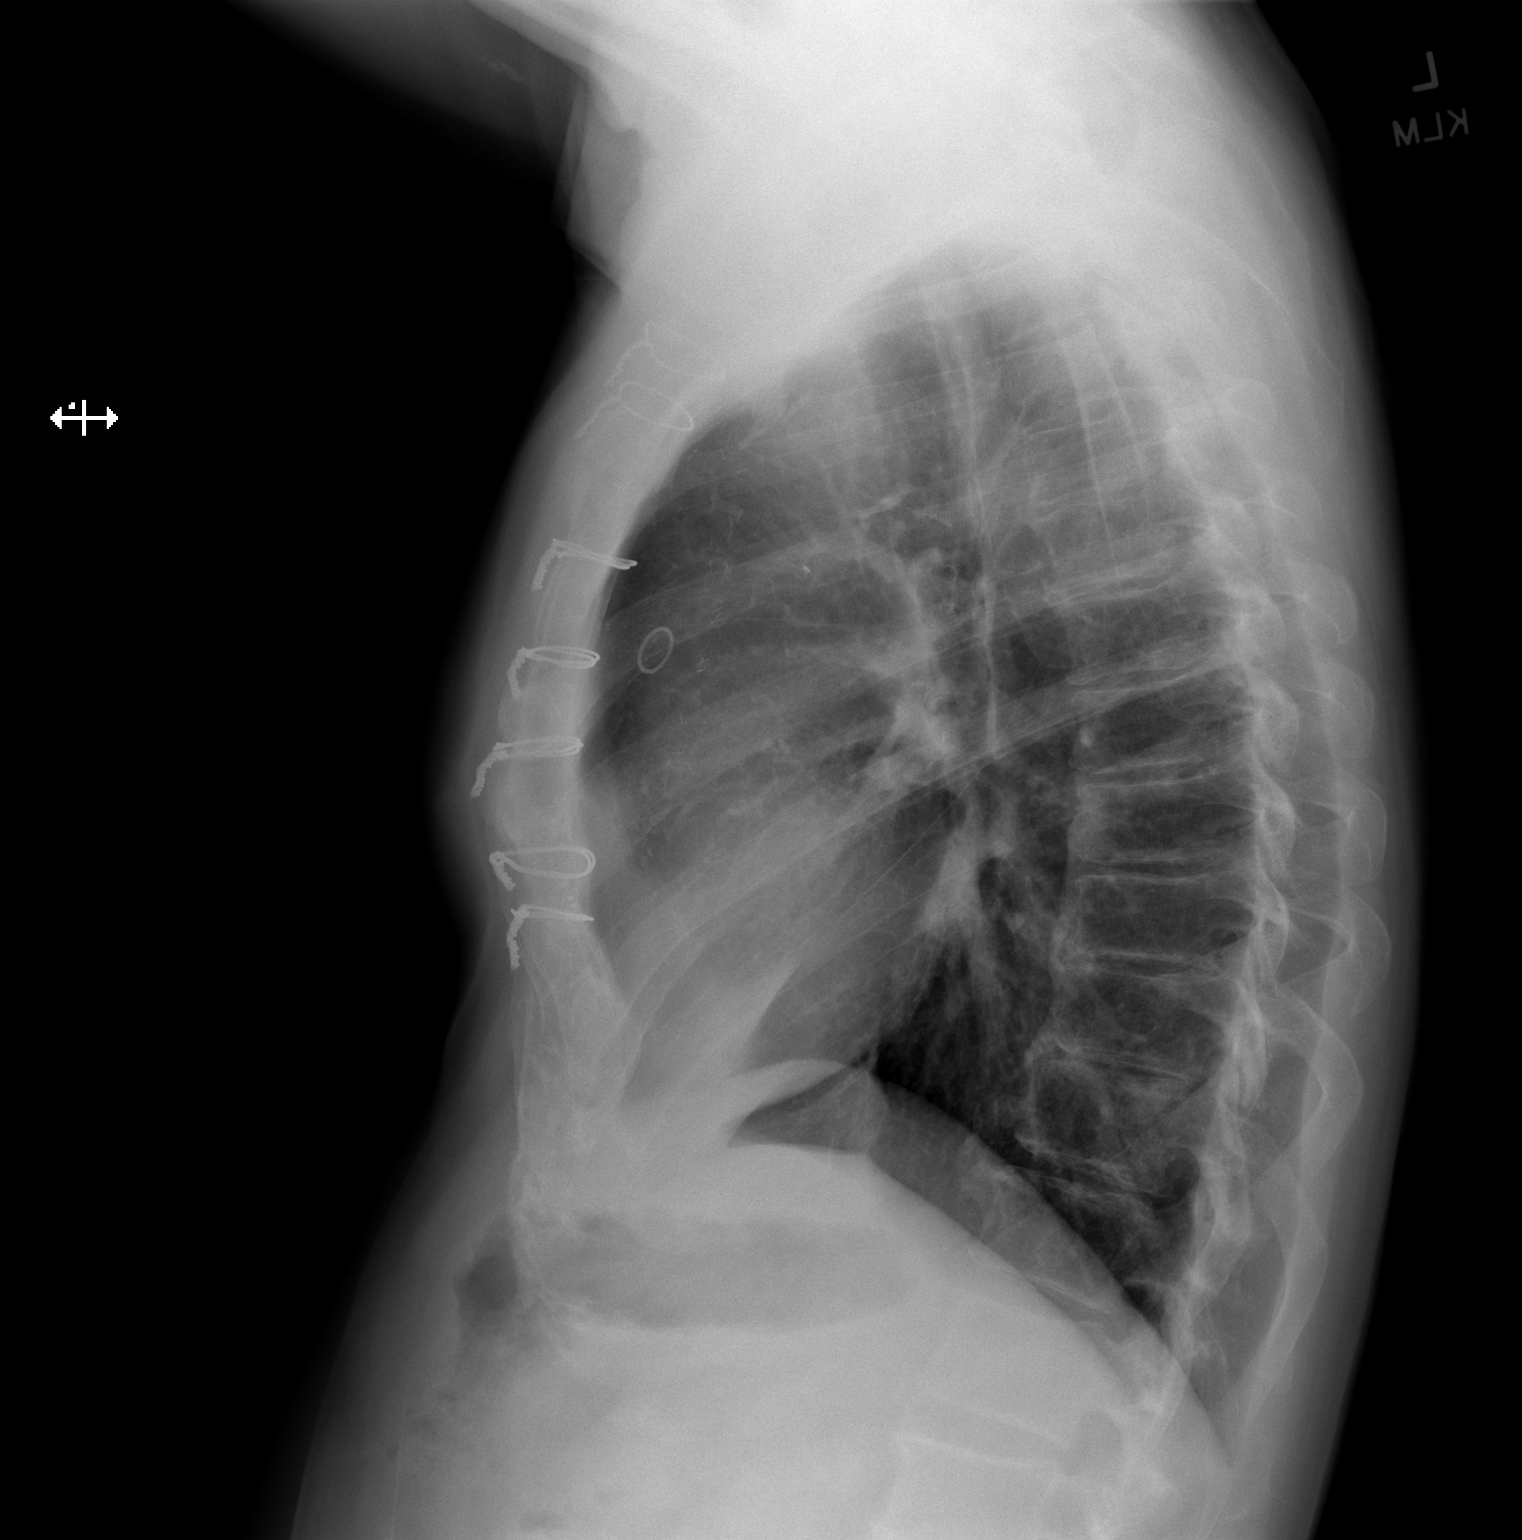

[2 of 2 positions shown; findings below may reference images not displayed]

FINDINGS: Sequelae of CABG are again identified. The cardiomediastinal
silhouette is unchanged with normal heart size. The lungs are well
inflated. No airspace consolidation, edema, pleural effusion,
pneumothorax is identified. No acute osseous abnormality is seen.
IMPRESSION: No active cardiopulmonary disease.

## 2020-11-22 ENCOUNTER — Encounter: Payer: Self-pay | Admitting: Orthopaedic Surgery

## 2020-11-23 ENCOUNTER — Telehealth: Payer: Self-pay

## 2020-11-23 NOTE — Telephone Encounter (Signed)
Patient asking for me to send you a message asking if he can be worked in Tuesday for a knee aspiration. I let him know it would be Monday before we called him back to let him know.

## 2020-11-26 ENCOUNTER — Other Ambulatory Visit: Payer: BC Managed Care – PPO

## 2020-11-26 NOTE — Telephone Encounter (Signed)
Called patient. Worked in Architectural technologist at [employer].

## 2020-11-27 ENCOUNTER — Encounter: Payer: Self-pay | Admitting: Orthopaedic Surgery

## 2020-11-27 ENCOUNTER — Other Ambulatory Visit: Payer: Self-pay

## 2020-11-27 ENCOUNTER — Ambulatory Visit (INDEPENDENT_AMBULATORY_CARE_PROVIDER_SITE_OTHER): Payer: BC Managed Care – PPO | Admitting: Orthopaedic Surgery

## 2020-11-27 VITALS — Ht 66.0 in | Wt 149.0 lb

## 2020-11-27 DIAGNOSIS — M1712 Unilateral primary osteoarthritis, left knee: Secondary | ICD-10-CM

## 2020-11-27 MED ORDER — LIDOCAINE HCL 1 % IJ SOLN
2.0000 mL | INTRAMUSCULAR | Status: AC | PRN
  Administered 2020-11-27: 2 mL

## 2020-11-27 NOTE — Progress Notes (Signed)
Office Visit Note   Patient: Philip Winters           Date of Birth: [birth year]           MRN: [id number] Visit Date: 11/27/2020              Requested by: Antony Contras, MD [place],  [place] [address] PCP: Antony Contras, MD   Assessment & Plan: Visit Diagnoses:  1. Primary osteoarthritis of left knee     Plan: Mr. [place] has end-stage osteoarthritis of his left knee associated with CPPD.  He has had recurrent episodes of fairly quick onset left knee effusion.  Have aspirated these in the past and fluid has been consistent with CPPD.  We are trying to schedule a total knee replacement for him within the next month.  Cortisone was not injected.  He felt much better after the aspiration  Follow-Up Instructions: Return if symptoms worsen or fail to improve.   Orders:  Orders Placed This Encounter  Procedures   Large Joint Inj: L knee   No orders of the defined types were placed in this encounter.     Procedures: Large Joint Inj: L knee on 11/27/2020 1:07 PM Indications: pain and diagnostic evaluation Details: 25 G 1.5 in needle, anteromedial approach  Arthrogram: No  Medications: 2 mL lidocaine 1 % Aspirate: 60 mL yellow and cloudy Outcome: tolerated well, no immediate complications  Aspirated 60 cc of somewhat cloudy fluid.  Patient has osteoarthritis and CPPD.  This was consistent with CPPD.  Trying to schedule knee replacement within the next month Procedure, treatment alternatives, risks and benefits explained, specific risks discussed. Consent was given by the patient. Patient was prepped and draped in the usual sterile fashion.      Clinical Data: No additional findings.   Subjective: Chief Complaint  Patient presents with   Left Knee - Pain, Follow-up  Patient presents today for his left knee. He wants to have his left knee aspirated today.  Has had similar procedures in the past.  He has evidence of end-stage osteoarthritis and  CPPD.  HPI  Review of Systems   Objective: Vital Signs: Ht 5\' 6"  (1.676 m)   Wt 149 lb (67.6 kg)   BMI 24.05 kg/m   Physical Exam Constitutional:      Appearance: He is well-developed.  Eyes:     Pupils: Pupils are equal, round, and reactive to light.  Pulmonary:     Effort: Pulmonary effort is normal.  Skin:    General: Skin is warm and dry.  Neurological:     Mental Status: He is alert and oriented to person, place, and time.  Psychiatric:        Behavior: Behavior normal.    Ortho Exam awake alert and oriented x3.  Comfortable sitting.  Left knee was warm and effused.  Lacks about 10 degrees to full extension.  Has mild medial lateral joint pain.  No calf pain.  Little bit of popliteal fullness.  Motor exam intact.  Much better range of motion less pain after knee aspiration but still lacks about 5 degrees to full extension consistent with the arthritis  Specialty Comments:  No specialty comments available.  Imaging: No results found.   PMFS History: Patient Active Problem List   Diagnosis Date Noted   Adhesive capsulitis of right shoulder 09/26/2020   Carpal tunnel syndrome, bilateral upper limbs 09/26/2020   Bilateral hand pain 09/20/2020   Chronic left  shoulder pain 08/23/2020   Right arm pain 08/23/2020   Cubital tunnel syndrome on left 08/23/2020   Left ankle sprain 07/12/2020   Visit for wound check 06/11/2020   Change or removal of surgical wound dressing 05/24/2020   Pulmonary nodule, left 05/23/2020   Cellulitis 05/23/2020   S/P CABG x 2 05/02/2020   Coronary artery disease 05/01/2020   Pulmonary nodule 1 cm or greater in diameter 04/26/2020   Statin intolerance 04/20/2020   Coronary atherosclerosis of native coronary artery 04/18/2020   Abnormal cardiovascular stress test    Nonrheumatic aortic valve stenosis    Exertional dyspnea    Stable angina pectoris (HCC)    Bicuspid aortic valve 04/06/2020   Primary osteoarthritis of left knee  11/08/2019   Bilateral primary osteoarthritis of knee 09/28/2019   HNP (herniated nucleus pulposus), lumbar 09/29/2017   Right carotid bruit 06/10/2017   Paresthesia 06/10/2017   Left wrist pain 10/09/2016   Unilateral primary osteoarthritis, right knee 06/24/2016   S/P total knee replacement using cement, right 06/24/2016   Acute pain of left knee 03/27/2016   Mixed hyperlipidemia 01/18/2007   HYPERTENSION 01/18/2007   Past Medical History:  Diagnosis Date   Allergy    Arthritis    Coronary artery disease    GERD (gastroesophageal reflux disease)    occ   Heart murmur    baby   High cholesterol    Hypertension    Pneumonia    Right carotid bruit 06/10/2017   Sciatica    Seasonal allergies    Sinus tachycardia    Spinal stenosis     History reviewed. No pertinent family history.  Past Surgical History:  Procedure Laterality Date   CARDIAC CATHETERIZATION     CARPAL TUNNEL WITH CUBITAL TUNNEL Left 11/10/2013   Procedure: LEFT CARPAL TUNNEL RELEASE;  Surgeon: Cammie Sickle, MD;  Location: [facility];  Service: Orthopedics;  Laterality: Left;   COLONOSCOPY     CORONARY ARTERY BYPASS GRAFT N/A 05/01/2020   Procedure: CORONARY ARTERY BYPASS GRAFTING (CABG) X 2 ON CARDIOPULMONARY BYPASS. LIMA TO LAD, SVG TO DIAG;  Surgeon: Ivin Poot, MD;  Location: [facility];  Service: Open Heart Surgery;  Laterality: N/A;   ENDOVEIN HARVEST OF GREATER SAPHENOUS VEIN Right 05/01/2020   Procedure: ENDOVEIN HARVEST OF GREATER SAPHENOUS VEIN;  Surgeon: Ivin Poot, MD;  Location: [facility];  Service: Open Heart Surgery;  Laterality: Right;   KNEE ARTHROSCOPY  [id number]   left and right   LUMBAR LAMINECTOMY/DECOMPRESSION MICRODISCECTOMY Left 09/29/2017   Procedure: LEFT LUMBAR TWO - LUMBAR THREELAMINOTOMY/MICRODISCECTOMY;  Surgeon: Jovita Gamma, MD;  Location: [facility];  Service: Neurosurgery;  Laterality: Left;  LEFT LUMBAR 2- LUMBAR 3 LAMINOTOMY/MICRODISCECTOMY   NASAL SINUS  SURGERY  05/2015   RIGHT/LEFT HEART CATH AND CORONARY ANGIOGRAPHY N/A 04/10/2020   Procedure: RIGHT/LEFT HEART CATH AND CORONARY ANGIOGRAPHY;  Surgeon: Troy Sine, MD;  Location: [facility];  Service: Cardiovascular;  Laterality: N/A;   TEE WITHOUT CARDIOVERSION N/A 05/01/2020   Procedure: TRANSESOPHAGEAL ECHOCARDIOGRAM (TEE);  Surgeon: Prescott Gum, Collier Salina, MD;  Location: [facility];  Service: Open Heart Surgery;  Laterality: N/A;   TOTAL KNEE ARTHROPLASTY Right 06/24/2016   Procedure: TOTAL KNEE ARTHROPLASTY;  Surgeon: Garald Balding, MD;  Location: [facility];  Service: Orthopedics;  Laterality: Right;   TRIGGER FINGER RELEASE Left 11/10/2013   Procedure: LEFT INDEX A-1 PULLEY RELEASE;  Surgeon: Cammie Sickle, MD;  Location: [facility];  Service: Orthopedics;  Laterality: Left;   ULNAR NERVE TRANSPOSITION Left 11/10/2013   Procedure: ULNAR NERVE TRANSPOSITION;  Surgeon: Cammie Sickle, MD;  Location: [facility];  Service: Orthopedics;  Laterality: Left;   Social History   Occupational History   Occupation: Self employed  Tobacco Use   Smoking status: Former    Packs/day: 1.00    Years: 37.00    Pack years: 37.00    Types: Cigarettes    Quit date: 05/04/2016    Years since quitting: 4.5   Smokeless tobacco: Never  Vaping Use   Vaping Use: Former  Substance and Sexual Activity   Alcohol use: Yes    Alcohol/week: 3.0 standard drinks    Types: 3 Glasses of wine per week    Comment: weekends only with dinner   Drug use: No   Sexual activity: Not on file

## 2020-11-28 NOTE — Progress Notes (Signed)
Pt. Needs orders for upcomming surgery.PAT and labs: 11/29/20.

## 2020-11-28 NOTE — Patient Instructions (Signed)
DUE TO COVID-19 ONLY ONE VISITOR IS ALLOWED TO COME WITH YOU AND STAY IN THE WAITING ROOM ONLY DURING PRE OP AND PROCEDURE DAY OF SURGERY. THE 1 VISITOR  MAY VISIT WITH YOU AFTER SURGERY IN YOUR PRIVATE ROOM DURING VISITING HOURS ONLY!  YOU NEED TO HAVE A COVID 19 TEST ON: 12/07/20 @               , THIS TEST MUST BE DONE BEFORE SURGERY,  COVID TESTING SITE [place] [address], IT IS ON THE RIGHT GOING OUT [address] APPROXIMATELY  2 MINUTES PAST [employer] ON THE RIGHT. ONCE YOUR COVID TEST IS COMPLETED,  PLEASE BEGIN THE QUARANTINE INSTRUCTIONS AS OUTLINED IN YOUR HANDOUT.                [place]   Your procedure is scheduled on: 12/11/20   Report to [facility] Main  Entrance   Report to short stay at : 5:15 AM     Call this number if you have problems the morning of surgery [phone]    Remember: Do not eat food or drink liquids :After Midnight.   BRUSH YOUR TEETH MORNING OF SURGERY AND RINSE YOUR MOUTH OUT, NO CHEWING GUM CANDY OR MINTS.    Take these medicines the morning of surgery with A SIP OF WATER: diltiazem.Use flonase as usual.                               You may not have any metal on your body including hair pins and              piercings  Do not wear jewelry, lotions, powders or perfumes, deodorant             Men may shave face and neck.   Do not bring valuables to the hospital. [place].  Contacts, dentures or bridgework may not be worn into surgery.  Leave suitcase in the car. After surgery it may be brought to your room.     Patients discharged the day of surgery will not be allowed to drive home. IF YOU ARE HAVING SURGERY AND GOING HOME THE SAME DAY, YOU MUST HAVE AN ADULT TO DRIVE YOU HOME AND BE WITH YOU FOR 24 HOURS. YOU MAY GO HOME BY TAXI OR UBER OR ORTHERWISE, BUT AN ADULT MUST ACCOMPANY YOU HOME AND STAY WITH YOU FOR 24 HOURS.  Name and phone number of  your driver:  Special Instructions: N/A              Please read over the following fact sheets you were given: _____________________________________________________________________           [facility] - Preparing for Surgery Before surgery, you can play an important role.  Because skin is not sterile, your skin needs to be as free of germs as possible.  You can reduce the number of germs on your skin by washing with CHG (chlorahexidine gluconate) soap before surgery.  CHG is an antiseptic cleaner which kills germs and bonds with the skin to continue killing germs even after washing. Please DO NOT use if you have an allergy to CHG or antibacterial soaps.  If your skin becomes reddened/irritated stop using the CHG and inform your nurse when you arrive at  Short Stay. Do not shave (including legs and underarms) for at least 48 hours prior to the first CHG shower.  You may shave your face/neck. Please follow these instructions carefully:  1.  Shower with CHG Soap the night before surgery and the  morning of Surgery.  2.  If you choose to wash your hair, wash your hair first as usual with your  normal  shampoo.  3.  After you shampoo, rinse your hair and body thoroughly to remove the  shampoo.                           4.  Use CHG as you would any other liquid soap.  You can apply chg directly  to the skin and wash                       Gently with a scrungie or clean washcloth.  5.  Apply the CHG Soap to your body ONLY FROM THE NECK DOWN.   Do not use on face/ open                           Wound or open sores. Avoid contact with eyes, ears mouth and genitals (private parts).                       Wash face,  Genitals (private parts) with your normal soap.             6.  Wash thoroughly, paying special attention to the area where your surgery  will be performed.  7.  Thoroughly rinse your body with warm water from the neck down.  8.  DO NOT shower/wash with your normal soap after using and rinsing  off  the CHG Soap.                9.  Pat yourself dry with a clean towel.            10.  Wear clean pajamas.            11.  Place clean sheets on your bed the night of your first shower and do not  sleep with pets. Day of Surgery : Do not apply any lotions/deodorants the morning of surgery.  Please wear clean clothes to the hospital/surgery center.  FAILURE TO FOLLOW THESE INSTRUCTIONS MAY RESULT IN THE CANCELLATION OF YOUR SURGERY PATIENT SIGNATURE_________________________________  NURSE SIGNATURE__________________________________  ________________________________________________________________________

## 2020-11-29 ENCOUNTER — Inpatient Hospital Stay (HOSPITAL_COMMUNITY)
Admission: RE | Admit: 2020-11-29 | Discharge: 2020-11-29 | Disposition: A | Discharge status: Home / Self Care | Payer: BC Managed Care – PPO | Source: Ambulatory Visit

## 2020-11-29 NOTE — Progress Notes (Signed)
Left a voicemail with Dr. Rudene Anda office to request pre op orders in epic.

## 2020-12-04 ENCOUNTER — Telehealth: Payer: Self-pay | Admitting: Orthopaedic Surgery

## 2020-12-04 DIAGNOSIS — Z736 Limitation of activities due to disability: Secondary | ICD-10-CM

## 2020-12-04 NOTE — Telephone Encounter (Signed)
Spoke with patient and relayed information below from Dr.Whitfield.

## 2020-12-04 NOTE — Telephone Encounter (Signed)
No-risk for infection increases with number of times knee is aspirated prior to surgery. Try ice or heat,

## 2020-12-04 NOTE — Telephone Encounter (Signed)
Pt and wife calling stating fluid/swelling has built up with the knee again. Pt does have upcoming surgery so they want to know if he needs to come in and get it redrained within the next week. The best call back number is [phone].

## 2020-12-04 NOTE — Patient Instructions (Signed)
DUE TO COVID-19 ONLY ONE VISITOR IS ALLOWED TO COME WITH YOU AND STAY IN THE WAITING ROOM ONLY DURING PRE OP AND PROCEDURE DAY OF SURGERY. THE 1 VISITOR  MAY VISIT WITH YOU AFTER SURGERY IN YOUR PRIVATE ROOM DURING VISITING HOURS ONLY!  YOU NEED TO HAVE A COVID 19 TEST ON: 12/14/20 @               , THIS TEST MUST BE DONE BEFORE SURGERY,  COVID TESTING SITE [place] [address], IT IS ON THE RIGHT GOING OUT [address] APPROXIMATELY  2 MINUTES PAST [employer] ON THE RIGHT. ONCE YOUR COVID TEST IS COMPLETED,  PLEASE BEGIN THE QUARANTINE INSTRUCTIONS AS OUTLINED IN YOUR HANDOUT.               [place]   Your procedure is scheduled on: 12/18/20   Report to [facility] Main  Entrance   Report to short stay at : 5:15 AM     Call this number if you have problems the morning of surgery [phone]    Remember: NO SOLID FOOD AFTER MIDNIGHT THE NIGHT PRIOR TO SURGERY. NOTHING BY MOUTH EXCEPT CLEAR LIQUIDS UNTIL : 4:15 AM. PLEASE FINISH ENSURE DRINK PER SURGEON ORDER  WHICH NEEDS TO BE COMPLETED AT: 4:15 AM .   CLEAR LIQUID DIET  Foods Allowed                                                                     Foods Excluded  Coffee and tea, regular and decaf                             liquids that you cannot  Plain Jell-O any favor except red or purple                                           see through such as: Fruit ices (not with fruit pulp)                                     milk, soups, orange juice  Iced Popsicles                                    All solid food Carbonated beverages, regular and diet                                    Cranberry, grape and apple juices Sports drinks like Gatorade Lightly seasoned clear broth or consume(fat free) Sugar, honey syrup  Sample Menu Breakfast                                Lunch  Supper Cranberry juice                    Beef broth                             Chicken broth Jell-O                                     Grape juice                           Apple juice Coffee or tea                        Jell-O                                      Popsicle                                                Coffee or tea                        Coffee or tea  _____________________________________________________________________   BRUSH YOUR TEETH MORNING OF SURGERY AND RINSE YOUR MOUTH OUT, NO CHEWING GUM CANDY OR MINTS.    Take these medicines the morning of surgery with A SIP OF WATER: diltiazem.Use flonase as usual.                               You may not have any metal on your body including hair pins and              piercings  Do not wear jewelry, lotions, powders or perfumes, deodorant             Men may shave face and neck.   Do not bring valuables to the hospital. [place].  Contacts, dentures or bridgework may not be worn into surgery.  Leave suitcase in the car. After surgery it may be brought to your room.     Patients discharged the day of surgery will not be allowed to drive home. IF YOU ARE HAVING SURGERY AND GOING HOME THE SAME DAY, YOU MUST HAVE AN ADULT TO DRIVE YOU HOME AND BE WITH YOU FOR 24 HOURS. YOU MAY GO HOME BY TAXI OR UBER OR ORTHERWISE, BUT AN ADULT MUST ACCOMPANY YOU HOME AND STAY WITH YOU FOR 24 HOURS.  Name and phone number of your driver:  Special Instructions: N/A              Please read over the following fact sheets you were given: _____________________________________________________________________           [facility] - Preparing for Surgery Before surgery, you can play an important role.  Because skin is not sterile, your skin needs to be as free of germs as possible.  You can reduce the number of germs on your skin by  washing with CHG (chlorahexidine gluconate) soap before surgery.  CHG is an antiseptic cleaner which kills germs and bonds with the skin to  continue killing germs even after washing. Please DO NOT use if you have an allergy to CHG or antibacterial soaps.  If your skin becomes reddened/irritated stop using the CHG and inform your nurse when you arrive at Short Stay. Do not shave (including legs and underarms) for at least 48 hours prior to the first CHG shower.  You may shave your face/neck. Please follow these instructions carefully:  1.  Shower with CHG Soap the night before surgery and the  morning of Surgery.  2.  If you choose to wash your hair, wash your hair first as usual with your  normal  shampoo.  3.  After you shampoo, rinse your hair and body thoroughly to remove the  shampoo.                           4.  Use CHG as you would any other liquid soap.  You can apply chg directly  to the skin and wash                       Gently with a scrungie or clean washcloth.  5.  Apply the CHG Soap to your body ONLY FROM THE NECK DOWN.   Do not use on face/ open                           Wound or open sores. Avoid contact with eyes, ears mouth and genitals (private parts).                       Wash face,  Genitals (private parts) with your normal soap.             6.  Wash thoroughly, paying special attention to the area where your surgery  will be performed.  7.  Thoroughly rinse your body with warm water from the neck down.  8.  DO NOT shower/wash with your normal soap after using and rinsing off  the CHG Soap.                9.  Pat yourself dry with a clean towel.            10.  Wear clean pajamas.            11.  Place clean sheets on your bed the night of your first shower and do not  sleep with pets. Day of Surgery : Do not apply any lotions/deodorants the morning of surgery.  Please wear clean clothes to the hospital/surgery center.  FAILURE TO FOLLOW THESE INSTRUCTIONS MAY RESULT IN THE CANCELLATION OF YOUR SURGERY PATIENT SIGNATURE_________________________________  NURSE  SIGNATURE__________________________________  ________________________________________________________________________   Philip Winters  An incentive spirometer is a tool that can help keep your lungs clear and active. This tool measures how well you are filling your lungs with each breath. Taking long deep breaths may help reverse or decrease the chance of developing breathing (pulmonary) problems (especially infection) following: A long period of time when you are unable to move or be active. BEFORE THE PROCEDURE  If the spirometer includes an indicator to show your best effort, your nurse or respiratory therapist will set it to a desired goal. If possible, sit up straight or lean  slightly forward. Try not to slouch. Hold the incentive spirometer in an upright position. INSTRUCTIONS FOR USE  Sit on the edge of your bed if possible, or sit up as far as you can in bed or on a chair. Hold the incentive spirometer in an upright position. Breathe out normally. Place the mouthpiece in your mouth and seal your lips tightly around it. Breathe in slowly and as deeply as possible, raising the piston or the ball toward the top of the column. Hold your breath for 3-5 seconds or for as long as possible. Allow the piston or ball to fall to the bottom of the column. Remove the mouthpiece from your mouth and breathe out normally. Rest for a few seconds and repeat Steps 1 through 7 at least 10 times every 1-2 hours when you are awake. Take your time and take a few normal breaths between deep breaths. The spirometer may include an indicator to show your best effort. Use the indicator as a goal to work toward during each repetition. After each set of 10 deep breaths, practice coughing to be sure your lungs are clear. If you have an incision (the cut made at the time of surgery), support your incision when coughing by placing a pillow or rolled up towels firmly against it. Once you are able to get out of  bed, walk around indoors and cough well. You may stop using the incentive spirometer when instructed by your caregiver.  RISKS AND COMPLICATIONS Take your time so you do not get dizzy or light-headed. If you are in pain, you may need to take or ask for pain medication before doing incentive spirometry. It is harder to take a deep breath if you are having pain. AFTER USE Rest and breathe slowly and easily. It can be helpful to keep track of a log of your progress. Your caregiver can provide you with a simple table to help with this. If you are using the spirometer at home, follow these instructions: [place] IF:  You are having difficultly using the spirometer. You have trouble using the spirometer as often as instructed. Your pain medication is not giving enough relief while using the spirometer. You develop fever of 100.5 F (38.1 C) or higher. SEEK IMMEDIATE MEDICAL CARE IF:  You cough up bloody sputum that had not been present before. You develop fever of 102 F (38.9 C) or greater. You develop worsening pain at or near the incision site. MAKE SURE YOU:  Understand these instructions. Will watch your condition. Will get help right away if you are not doing well or get worse. Document Released: 09/15/2006 Document Revised: 07/28/2011 Document Reviewed: 11/16/2006 [facility] Patient Information 2014 [place], [place].   ________________________________________________________________________

## 2020-12-05 ENCOUNTER — Encounter (HOSPITAL_COMMUNITY)
Admission: RE | Admit: 2020-12-05 | Discharge: 2020-12-05 | Disposition: A | Discharge status: Home / Self Care | Payer: BC Managed Care – PPO | Source: Ambulatory Visit | Attending: Orthopaedic Surgery | Admitting: Orthopaedic Surgery

## 2020-12-05 ENCOUNTER — Encounter (HOSPITAL_COMMUNITY): Payer: Self-pay

## 2020-12-05 ENCOUNTER — Other Ambulatory Visit: Payer: Self-pay

## 2020-12-05 DIAGNOSIS — Z01812 Encounter for preprocedural laboratory examination: Secondary | ICD-10-CM | POA: Diagnosis not present

## 2020-12-05 LAB — URINALYSIS, ROUTINE W REFLEX MICROSCOPIC
Bacteria, UA: NONE SEEN
Bilirubin Urine: NEGATIVE
Glucose, UA: NEGATIVE mg/dL
Hgb urine dipstick: NEGATIVE
Ketones, ur: 5 mg/dL — AB
Leukocytes,Ua: NEGATIVE
Nitrite: NEGATIVE
Protein, ur: 30 mg/dL — AB
Specific Gravity, Urine: 1.026 (ref 1.005–1.030)
pH: 6 (ref 5.0–8.0)

## 2020-12-05 LAB — CBC WITH DIFFERENTIAL/PLATELET
Abs Immature Granulocytes: 0.01 10*3/uL (ref 0.00–0.07)
Basophils Absolute: 0.1 10*3/uL (ref 0.0–0.1)
Basophils Relative: 2 %
Eosinophils Absolute: 0.1 10*3/uL (ref 0.0–0.5)
Eosinophils Relative: 2 %
HCT: 43.8 % (ref 39.0–52.0)
Hemoglobin: 14.7 g/dL (ref 13.0–17.0)
Immature Granulocytes: 0 %
Lymphocytes Relative: 24 %
Lymphs Abs: 1.7 10*3/uL (ref 0.7–4.0)
MCH: 31.4 pg (ref 26.0–34.0)
MCHC: 33.6 g/dL (ref 30.0–36.0)
MCV: 93.6 fL (ref 80.0–100.0)
Monocytes Absolute: 0.7 10*3/uL (ref 0.1–1.0)
Monocytes Relative: 9 %
Neutro Abs: 4.4 10*3/uL (ref 1.7–7.7)
Neutrophils Relative %: 63 %
Platelets: 259 10*3/uL (ref 150–400)
RBC: 4.68 MIL/uL (ref 4.22–5.81)
RDW: 13.6 % (ref 11.5–15.5)
WBC: 6.9 10*3/uL (ref 4.0–10.5)
nRBC: 0 % (ref 0.0–0.2)

## 2020-12-05 LAB — COMPREHENSIVE METABOLIC PANEL
ALT: 22 U/L (ref 0–44)
AST: 30 U/L (ref 15–41)
Albumin: 4.4 g/dL (ref 3.5–5.0)
Alkaline Phosphatase: 150 U/L — ABNORMAL HIGH (ref 38–126)
Anion gap: 11 (ref 5–15)
BUN: 15 mg/dL (ref 8–23)
CO2: 27 mmol/L (ref 22–32)
Calcium: 9.7 mg/dL (ref 8.9–10.3)
Chloride: 102 mmol/L (ref 98–111)
Creatinine, Ser: 0.71 mg/dL (ref 0.61–1.24)
GFR, Estimated: >60 mL/min (ref >60)
Glucose, Bld: 113 mg/dL — ABNORMAL HIGH (ref 70–99)
Potassium: 4.3 mmol/L (ref 3.5–5.1)
Sodium: 140 mmol/L (ref 135–145)
Total Bilirubin: 0.7 mg/dL (ref 0.3–1.2)
Total Protein: 7.4 g/dL (ref 6.5–8.1)

## 2020-12-05 LAB — SURGICAL PCR SCREEN
MRSA, PCR: NEGATIVE
Staphylococcus aureus: POSITIVE — AB

## 2020-12-05 LAB — TYPE AND SCREEN
ABO/RH(D): A NEG
Antibody Screen: NEGATIVE

## 2020-12-05 NOTE — Progress Notes (Signed)
COVID Vaccine Completed: NO Date COVID Vaccine completed: COVID vaccine manufacturer: Pf[employer]    [employer]   PCP - Dr. Antony Contras Cardiologist - Dr. Candee Furbish. LOV: 10/17/20.  Chest x-ray - 11/20/20 EPIC EKG - 05/21/20 EPIC Stress Test -  ECHO - 05/01/20 EPIC Cardiac Cath -  Pacemaker/ICD device last checked:  Sleep Study -  CPAP -   Fasting Blood Sugar -  Checks Blood Sugar _____ times a day  Blood Thinner Instructions: Aspirin Instructions: Will be held a week before as per surgeon's instructions. Last Dose:  Anesthesia review: Hx: CAD,Heart murmur,HTN,Tachycardia,CABG x 2 (05/01/20)  Patient denies shortness of breath, fever, cough and chest pain at PAT appointment   Patient verbalized understanding of instructions that were given to them at the PAT appointment. Patient was also instructed that they will need to review over the PAT instructions again at home before surgery.

## 2020-12-06 NOTE — Progress Notes (Signed)
Lab report: PCR: Positive [facility]. UA: protein urine: 30

## 2020-12-07 NOTE — Progress Notes (Signed)
Some pre op labs abnormal-need to be sure he has been cleared by his family physician. Surgery in about 2 weeks. Might be worthwhile for him to see his primary care MD again

## 2020-12-12 NOTE — H&P (Addendum)
TOTAL KNEE ADMISSION H&P  Patient is being admitted for left total knee arthroplasty.  Subjective:  Chief Complaint:left knee pain.  HPI: Philip Winters, 60 y.o. male, has a history of pain and functional disability in the left knee due to arthritis and has failed non-surgical conservative treatments for greater than 12 weeks to includeNSAID's and/or analgesics, corticosteriod injections, viscosupplementation injections, flexibility and strengthening excercises, weight reduction as appropriate, and activity modification.  Onset of symptoms was gradual, starting 3 years ago with gradually worsening course since that time. The patient noted prior procedures on the knee to include  arthroscopy and menisectomy on the left knee(s).  Patient currently rates pain in the left knee(s) at 4 to 6 out of 10 with activity. Patient has night pain, worsening of pain with activity and weight bearing, pain that interferes with activities of daily living, pain with passive range of motion, crepitus, and joint swelling.  Patient has evidence of subchondral cysts, subchondral sclerosis, periarticular osteophytes, joint subluxation, and joint space narrowing by imaging studies.  There is no active infection.  Patient Active Problem List   Diagnosis Date Noted   Adhesive capsulitis of right shoulder 09/26/2020   Carpal tunnel syndrome, bilateral upper limbs 09/26/2020   Bilateral hand pain 09/20/2020   Chronic left shoulder pain 08/23/2020   Right arm pain 08/23/2020   Cubital tunnel syndrome on left 08/23/2020   Left ankle sprain 07/12/2020   Visit for wound check 06/11/2020   Change or removal of surgical wound dressing 05/24/2020   Pulmonary nodule, left 05/23/2020   Cellulitis 05/23/2020   S/P CABG x 2 05/02/2020   Coronary artery disease 05/01/2020   Pulmonary nodule 1 cm or greater in diameter 04/26/2020   Statin intolerance 04/20/2020   Coronary atherosclerosis of native coronary artery 04/18/2020    Abnormal cardiovascular stress test    Nonrheumatic aortic valve stenosis    Exertional dyspnea    Stable angina pectoris (HCC)    Bicuspid aortic valve 04/06/2020   Primary osteoarthritis of left knee 11/08/2019   Bilateral primary osteoarthritis of knee 09/28/2019   HNP (herniated nucleus pulposus), lumbar 09/29/2017   Right carotid bruit 06/10/2017   Paresthesia 06/10/2017   Left wrist pain 10/09/2016   Unilateral primary osteoarthritis, right knee 06/24/2016   S/P total knee replacement using cement, right 06/24/2016   Acute pain of left knee 03/27/2016   Mixed hyperlipidemia 01/18/2007   HYPERTENSION 01/18/2007   Past Medical History:  Diagnosis Date   Allergy    Arthritis    Coronary artery disease    GERD (gastroesophageal reflux disease)    occ   Heart murmur    baby   High cholesterol    Hypertension    Pneumonia    Right carotid bruit 06/10/2017   Sciatica    Seasonal allergies    Sinus tachycardia    Spinal stenosis     Past Surgical History:  Procedure Laterality Date   CARDIAC CATHETERIZATION     CARPAL TUNNEL WITH CUBITAL TUNNEL Left 11/10/2013   Procedure: LEFT CARPAL TUNNEL RELEASE;  Surgeon: Cammie Sickle, MD;  Location: [place];  Service: Orthopedics;  Laterality: Left;   COLONOSCOPY     CORONARY ARTERY BYPASS GRAFT N/A 05/01/2020   Procedure: CORONARY ARTERY BYPASS GRAFTING (CABG) X 2 ON CARDIOPULMONARY BYPASS. LIMA TO LAD, SVG TO DIAG;  Surgeon: Ivin Poot, MD;  Location: [place];  Service: Open Heart Surgery;  Laterality: N/A;   ENDOVEIN HARVEST OF  GREATER SAPHENOUS VEIN Right 05/01/2020   Procedure: ENDOVEIN HARVEST OF GREATER SAPHENOUS VEIN;  Surgeon: Ivin Poot, MD;  Location: [place];  Service: Open Heart Surgery;  Laterality: Right;   KNEE ARTHROSCOPY  [id number]   left and right   LUMBAR LAMINECTOMY/DECOMPRESSION MICRODISCECTOMY Left 09/29/2017   Procedure: LEFT LUMBAR TWO - LUMBAR THREELAMINOTOMY/MICRODISCECTOMY;   Surgeon: Jovita Gamma, MD;  Location: [place];  Service: Neurosurgery;  Laterality: Left;  LEFT LUMBAR 2- LUMBAR 3 LAMINOTOMY/MICRODISCECTOMY   NASAL SINUS SURGERY  05/2015   RIGHT/LEFT HEART CATH AND CORONARY ANGIOGRAPHY N/A 04/10/2020   Procedure: RIGHT/LEFT HEART CATH AND CORONARY ANGIOGRAPHY;  Surgeon: Troy Sine, MD;  Location: [place] [facility];  Service: Cardiovascular;  Laterality: N/A;   TEE WITHOUT CARDIOVERSION N/A 05/01/2020   Procedure: TRANSESOPHAGEAL ECHOCARDIOGRAM (TEE);  Surgeon: Prescott Gum, Collier Salina, MD;  Location: [place];  Service: Open Heart Surgery;  Laterality: N/A;   TOTAL KNEE ARTHROPLASTY Right 06/24/2016   Procedure: TOTAL KNEE ARTHROPLASTY;  Surgeon: Garald Balding, MD;  Location: [place];  Service: Orthopedics;  Laterality: Right;   TRIGGER FINGER RELEASE Left 11/10/2013   Procedure: LEFT INDEX A-1 PULLEY RELEASE;  Surgeon: Cammie Sickle, MD;  Location: [place];  Service: Orthopedics;  Laterality: Left;   ULNAR NERVE TRANSPOSITION Left 11/10/2013   Procedure: ULNAR NERVE TRANSPOSITION;  Surgeon: Cammie Sickle, MD;  Location: [place];  Service: Orthopedics;  Laterality: Left;    No current facility-administered medications for this encounter.   Current Outpatient Medications  Medication Sig Dispense Refill Last Dose   aspirin EC 81 MG tablet Take 1 tablet (81 mg total) by mouth daily. Swallow whole. (Patient taking differently: Take 81 mg by mouth in the morning. Swallow whole.) 90 tablet 3    diclofenac Sodium (VOLTAREN) 1 % GEL Apply 2 g topically daily as needed (Knee and back pain).      diltiazem (CARDIZEM CD) 240 MG 24 hr capsule Take 1 capsule (240 mg total) by mouth daily. (Patient taking differently: Take 240 mg by mouth in the morning.) 90 capsule 3    diphenhydrAMINE (BENADRYL) 25 MG tablet Take 12.5 mg by mouth at bedtime.      fluticasone (FLONASE) 50 MCG/ACT nasal spray Place 2 sprays into both nostrils daily  as needed for allergies.       Multiple Vitamin (MULTIVITAMIN WITH MINERALS) TABS tablet Take 1 tablet by mouth in the morning.      naproxen sodium (ALEVE) 220 MG tablet Take 220 mg by mouth daily as needed (pain.).      Polyethyl Glycol-Propyl Glycol (LUBRICANT EYE DROPS) 0.4-0.3 % SOLN Place 1 drop into both eyes 3 (three) times daily as needed (dry/irritated eyes).      traMADol (ULTRAM) 50 MG tablet Take 25 mg by mouth daily as needed (pain.).      Evolocumab (REPATHA SURECLICK) [id number] MG/ML SOAJ Inject 1 pen into the skin every 14 (fourteen) days. (Patient not taking: Reported on 11/28/2020) 2 mL 11 Not Taking   Allergies  Allergen Reactions   Mushroom Extract Complex Anaphylaxis   Penicillins Shortness Of Breath    Childhood allergy.  Has patient had a PCN reaction causing immediate rash, facial/tongue/throat swelling, SOB or lightheadedness with hypotension: No Has patient had a PCN reaction causing severe rash involving mucus membranes or skin necrosis: No Has patient had a PCN reaction that required hospitalization No Has patient had a PCN reaction occurring within the last 10  years: No If all of the above answers are "NO", then may proceed with C   Atorvastatin Other (See Comments)    myalgia   Chantix [Varenicline]     Other reaction(s): headaches   Dilaudid [Hydromorphone] Nausea And Vomiting   Losartan Potassium Other (See Comments)    Lethargic    Pravastatin Other (See Comments)    myalgia   Rosuvastatin Other (See Comments)    myalgia   Bystolic [Nebivolol Hcl] Other (See Comments)    Extreme fatigue, not able to focus   Codeine Nausea And Vomiting   Iodine Rash   Iohexol Rash   Metoprolol Other (See Comments)    Made patient very fatigued, could not focus   Repatha [Evolocumab] Rash and Other (See Comments)    Hip pain    Social History   Tobacco Use   Smoking status: Former    Packs/day: 1.00    Years: 37.00    Pack years: 37.00    Types: Cigarettes     Quit date: 05/04/2016    Years since quitting: 4.6   Smokeless tobacco: Never  Substance Use Topics   Alcohol use: Yes    Alcohol/week: 3.0 standard drinks    Types: 3 Glasses of wine per week    Comment: weekends only with dinner    No family history on file.   Review of Systems  Constitutional: Negative.   HENT: Negative.    Eyes: Negative.   Respiratory: Negative.    Cardiovascular: Negative.   Gastrointestinal: Negative.   Endocrine: Negative.   Genitourinary: Negative.   Musculoskeletal:  Positive for back pain and joint swelling.  Skin: Negative.    Objective:  Physical Exam Constitutional:      Appearance: He is normal weight.  HENT:     Head: Normocephalic and atraumatic.     Right Ear: Ear canal normal.     Left Ear: Ear canal normal.     Nose: Nose normal.     Mouth/Throat:     Mouth: Mucous membranes are moist.     Pharynx: Oropharynx is clear. No oropharyngeal exudate.  Eyes:     Extraocular Movements: Extraocular movements intact.     Conjunctiva/sclera: Conjunctivae normal.     Pupils: Pupils are equal, round, and reactive to light.  Cardiovascular:     Rate and Rhythm: Tachycardia present.     Pulses: Normal pulses.     Heart sounds: Murmur (Grade 3/6 SEM) heard.  Pulmonary:     Effort: Pulmonary effort is normal.     Breath sounds: Normal breath sounds.  Abdominal:     General: Abdomen is flat. Bowel sounds are normal. There is no distension.     Palpations: Abdomen is soft.     Tenderness: There is no abdominal tenderness.  Musculoskeletal:     Cervical back: Neck supple.  Neurological:     Mental Status: He is alert.  Left knee:    Mild effusion without warmth.  ROM 12-95 degrees with crepitance   Vital signs in last 24 hours:   Temp 98.4, Pulse 116, BP 153/83   Labs:   Estimated body mass index is 23.26 kg/m as calculated from the following:   Height as of 12/05/20: '5\' 8"'$  (1.727 m).   Weight as of 12/05/20: 69.4 kg.   Imaging  Review Plain radiographs demonstrate moderate degenerative joint disease of the left knee(s). The overall alignment ismild varus. The bone quality appears to be good for age and reported activity level.  Assessment/Plan:  End stage arthritis, left knee   The patient history, physical examination, clinical judgment of the provider and imaging studies are consistent with end stage degenerative joint disease of the left knee(s) and total knee arthroplasty is deemed medically necessary. The treatment options including medical management, injection therapy arthroscopy and arthroplasty were discussed at length. The risks and benefits of total knee arthroplasty were presented and reviewed. The risks due to aseptic loosening, infection, stiffness, patella tracking problems, thromboembolic complications and other imponderables were discussed. The patient acknowledged the explanation, agreed to proceed with the plan and consent was signed. Patient is being admitted for inpatient treatment for surgery, pain control, PT, OT, prophylactic antibiotics, VTE prophylaxis, progressive ambulation and ADL's and discharge planning. The patient is planning to be discharged home with home health services     Patient's anticipated LOS is less than 2 midnights, meeting these requirements: - Younger than 11 - Lives within 1 hour of care - Has a competent adult at home to recover with post-op recover - NO history of  - Chronic pain requiring opiods  - Diabetes  - Coronary Artery Disease  - Heart failure  - Heart attack  - Stroke  - DVT/VTE  - Cardiac arrhythmia  - Respiratory Failure/COPD  - Renal failure  - Anemia  - Advanced Liver disease

## 2020-12-13 ENCOUNTER — Other Ambulatory Visit: Payer: Self-pay

## 2020-12-13 ENCOUNTER — Ambulatory Visit: Payer: BC Managed Care – PPO | Admitting: Orthopedic Surgery

## 2020-12-13 NOTE — Anesthesia Preprocedure Evaluation (Addendum)
Anesthesia Evaluation  Patient identified by MRN, date of birth, ID band Patient awake    Reviewed: Allergy & Precautions, NPO status , Patient's Chart, lab work & pertinent test results  Airway Mallampati: II  TM Distance: >3 FB Neck ROM: Full    Dental no notable dental hx. (+) Teeth Intact, Dental Advisory Given,    Pulmonary former smoker,    Pulmonary exam normal breath sounds clear to auscultation       Cardiovascular hypertension, Pt. on medications + angina + CAD and + CABG (12/21)  Normal cardiovascular exam Rhythm:Regular Rate:Normal  05/01/2020 TEE EF 55-60%   Neuro/Psych negative psych ROS   GI/Hepatic Neg liver ROS, GERD  ,  Endo/Other  negative endocrine ROS  Renal/GU negative Renal ROSLab Results      Component                Value               Date                      CREATININE               0.71                12/05/2020                BUN                      15                  12/05/2020                NA                       140                 12/05/2020                K                        4.3                 12/05/2020                CL                       102                 12/05/2020                CO2                      27                  12/05/2020                Musculoskeletal  (+) Arthritis , Osteoarthritis,    Abdominal   Peds  Hematology Lab Results      Component                Value               Date                      WBC  6.9                 12/05/2020                HGB                      14.7                12/05/2020                HCT                      43.8                12/05/2020                MCV                      93.6                12/05/2020                PLT                      259                 12/05/2020              Anesthesia Other Findings All:  See list  Reproductive/Obstetrics                            Anesthesia Physical Anesthesia Plan  ASA: 3  Anesthesia Plan: Spinal   Post-op Pain Management:  Regional for Post-op pain   Induction:   PONV Risk Score and Plan: 2 and Treatment may vary due to age or medical condition, Ondansetron, Midazolam and Dexamethasone  Airway Management Planned: Natural Airway  Additional Equipment: None  Intra-op Plan:   Post-operative Plan:   Informed Consent: I have reviewed the patients History and Physical, chart, labs and discussed the procedure including the risks, benefits and alternatives for the proposed anesthesia with the patient or authorized representative who has indicated his/her understanding and acceptance.       Plan Discussed with: CRNA and Anesthesiologist  Anesthesia Plan Comments: (See PAT note 12/05/2020, Konrad Felix, PA-C  Spinal w L adductor)      Anesthesia Quick Evaluation

## 2020-12-13 NOTE — Progress Notes (Signed)
Anesthesia Chart Review   Case: [id number] Date/Time: 12/18/20 0700   Procedure: LEFT TOTAL KNEE ARTHROPLASTY (Left: Knee)   Anesthesia type: Choice   Pre-op diagnosis: left knee oa   Location: [facility]   Surgeons: Garald Balding, MD       DISCUSSION:60 y.o. former smoker with h/o HTN, GERD, CAD (CABG), left knee OA scheduled for above procedure 12/18/2020 with Dr. Joni Fears.   S/p CABG 05/01/2020.   Pt last seen by cardiology 10/17/2020. Per OV note, "Since he is close to 6 months post CABG, I think it is reasonable for her to proceed.  He has discussed this previously with Dr. Kipp Brood and he agrees.  Optimally, we like to wait 1 year for elective surgeries but in his case this seems reasonable.  If he can be operated on while taking aspirin that would be great."  Anticipate pt can proceed with planned procedure barring acute status change.   VS: BP (!) 156/97   Pulse (!) 118   Temp 36.8 C (Oral)   Ht '5\' 8"'$  (1.727 m)   Wt 69.4 kg   SpO2 98%   BMI 23.26 kg/m   PROVIDERS: Antony Contras, MD is PCP   Candee Furbish, MD is Cardiologist  LABS: Labs reviewed: Acceptable for surgery. (all labs ordered are listed, but only abnormal results are displayed)  Labs Reviewed  SURGICAL PCR SCREEN - Abnormal; Notable for the following components:      Result Value   Staphylococcus aureus POSITIVE (*)    All other components within normal limits  COMPREHENSIVE METABOLIC PANEL - Abnormal; Notable for the following components:   Glucose, Bld 113 (*)    Alkaline Phosphatase 150 (*)    All other components within normal limits  URINALYSIS, ROUTINE W REFLEX MICROSCOPIC - Abnormal; Notable for the following components:   Color, Urine AMBER (*)    APPearance HAZY (*)    Ketones, ur 5 (*)    Protein, ur 30 (*)    All other components within normal limits  CBC WITH DIFFERENTIAL/PLATELET  TYPE AND SCREEN     IMAGES:   EKG:   CV: Cardiac Cath 04/10/2020  Prox RCA  lesion is 80% stenosed. Prox RCA to Mid RCA lesion is 80% stenosed. Non-stenotic 1st Diag-1 lesion. 1st Diag-2 lesion is 30% stenosed. Prox LAD to Mid LAD lesion is 40% stenosed with 40% stenosed side branch in 1st Diag. Ost LAD to Prox LAD lesion is 20% stenosed. Prox Cx lesion is 30% stenosed.   There is evidence for coronary calcification.     The LAD has 20% proximal narrowing and then gives rise to a proximal diagonal vessel which has a focal aneurysm extending from the ostium to the proximal diagonal segment.   In the LAD, at the takeoff of this diagonal aneurysm,  there appears to be a 30 to 40% narrowing immediately proximal and distal to the diagonal vessel.  The diagonal vessel has a 30% narrowing and a sharp bend of the vessel immediately beyond  the distal portion of the aneurysm.   The circumflex vessel is a dominant vessel with proximal calcification and narrowing of 30% in the region of the first marginal branch.   The RCA is a small nondominant vessel that has proximal and mid 80% stenoses.   Calcified aortic valve with mild aortic valve stenosis.   Normal right heart pressures with no evidence for pulmonary hypertension.   RECOMMENDATION: Angiograms will be reviewed with colleagues.  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  There appears to be mild to moderate disease in the LAD at the aneurysm takeoff as well as just beyond the aneurysm in the diagonal vessel.  Plan initial medical therapy.  The images were reviewed with Dr. Servando Snare.  Patient will follow up with Dr. Marlou Porch.   Echo 04/05/2020   1. Left ventricular ejection fraction, by estimation, is 60 to 65%. The  left ventricle has normal function. The left ventricle has no regional  wall motion abnormalities. Left ventricular diastolic parameters are  consistent with Grade I diastolic  dysfunction (impaired relaxation).   2. Right ventricular systolic function is normal. The right ventricular   size is normal.   3. The mitral valve is normal in structure. No evidence of mitral valve  regurgitation.   4. The aortic valve is calcified. There is moderate calcification of the  aortic valve. There is moderate thickening of the aortic valve. Aortic  valve regurgitation is not visualized. Mild aortic valve stenosis.   5. Aortic dilatation noted. There is mild to moderate dilatation of the  ascending aorta, measuring 40 mm.  Past Medical History:  Diagnosis Date   Allergy    Arthritis    Coronary artery disease    GERD (gastroesophageal reflux disease)    occ   Heart murmur    baby   High cholesterol    Hypertension    Pneumonia    Right carotid bruit 06/10/2017   Sciatica    Seasonal allergies    Sinus tachycardia    Spinal stenosis     Past Surgical History:  Procedure Laterality Date   CARDIAC CATHETERIZATION     CARPAL TUNNEL WITH CUBITAL TUNNEL Left 11/10/2013   Procedure: LEFT CARPAL TUNNEL RELEASE;  Surgeon: Cammie Sickle, MD;  Location: [place];  Service: Orthopedics;  Laterality: Left;   COLONOSCOPY     CORONARY ARTERY BYPASS GRAFT N/A 05/01/2020   Procedure: CORONARY ARTERY BYPASS GRAFTING (CABG) X 2 ON CARDIOPULMONARY BYPASS. LIMA TO LAD, SVG TO DIAG;  Surgeon: Ivin Poot, MD;  Location: [place];  Service: Open Heart Surgery;  Laterality: N/A;   ENDOVEIN HARVEST OF GREATER SAPHENOUS VEIN Right 05/01/2020   Procedure: ENDOVEIN HARVEST OF GREATER SAPHENOUS VEIN;  Surgeon: Ivin Poot, MD;  Location: [place];  Service: Open Heart Surgery;  Laterality: Right;   KNEE ARTHROSCOPY  [id number]   left and right   LUMBAR LAMINECTOMY/DECOMPRESSION MICRODISCECTOMY Left 09/29/2017   Procedure: LEFT LUMBAR TWO - LUMBAR THREELAMINOTOMY/MICRODISCECTOMY;  Surgeon: Jovita Gamma, MD;  Location: [place];  Service: Neurosurgery;  Laterality: Left;  LEFT LUMBAR 2- LUMBAR 3 LAMINOTOMY/MICRODISCECTOMY   NASAL SINUS SURGERY  05/2015   RIGHT/LEFT HEART CATH  AND CORONARY ANGIOGRAPHY N/A 04/10/2020   Procedure: RIGHT/LEFT HEART CATH AND CORONARY ANGIOGRAPHY;  Surgeon: Troy Sine, MD;  Location: [place] [facility];  Service: Cardiovascular;  Laterality: N/A;   TEE WITHOUT CARDIOVERSION N/A 05/01/2020   Procedure: TRANSESOPHAGEAL ECHOCARDIOGRAM (TEE);  Surgeon: Prescott Gum, Collier Salina, MD;  Location: [place];  Service: Open Heart Surgery;  Laterality: N/A;   TOTAL KNEE ARTHROPLASTY Right 06/24/2016   Procedure: TOTAL KNEE ARTHROPLASTY;  Surgeon: Garald Balding, MD;  Location: [place];  Service: Orthopedics;  Laterality: Right;   TRIGGER FINGER RELEASE Left 11/10/2013   Procedure: LEFT INDEX A-1 PULLEY RELEASE;  Surgeon: Cammie Sickle, MD;  Location[facility] [place];  Service: Orthopedics;  Laterality: Left;  ULNAR NERVE TRANSPOSITION Left 11/10/2013   Procedure: ULNAR NERVE TRANSPOSITION;  Surgeon: Cammie Sickle, MD;  Location: [place];  Service: Orthopedics;  Laterality: Left;    MEDICATIONS:  aspirin EC 81 MG tablet   diclofenac Sodium (VOLTAREN) 1 % GEL   diltiazem (CARDIZEM CD) 240 MG 24 hr capsule   diphenhydrAMINE (BENADRYL) 25 MG tablet   Evolocumab (REPATHA SURECLICK) [id number] MG/ML SOAJ   fluticasone (FLONASE) 50 MCG/ACT nasal spray   Multiple Vitamin (MULTIVITAMIN WITH MINERALS) TABS tablet   naproxen sodium (ALEVE) 220 MG tablet   Polyethyl Glycol-Propyl Glycol (LUBRICANT EYE DROPS) 0.4-0.3 % SOLN   traMADol (ULTRAM) 50 MG tablet   No current facility-administered medications for this encounter.    Konrad Felix, PA-C [facility] Pre-Surgical Testing [phone]

## 2020-12-14 ENCOUNTER — Other Ambulatory Visit (HOSPITAL_COMMUNITY)
Admission: RE | Admit: 2020-12-14 | Discharge: 2020-12-14 | Disposition: A | Discharge status: Home / Self Care | Payer: BC Managed Care – PPO | Source: Ambulatory Visit | Attending: Orthopaedic Surgery | Admitting: Orthopaedic Surgery

## 2020-12-14 DIAGNOSIS — Z01812 Encounter for preprocedural laboratory examination: Secondary | ICD-10-CM | POA: Diagnosis not present

## 2020-12-14 DIAGNOSIS — Z20822 Contact with and (suspected) exposure to covid-19: Secondary | ICD-10-CM | POA: Diagnosis not present

## 2020-12-14 LAB — SARS CORONAVIRUS 2 (TAT 6-24 HRS): SARS Coronavirus 2: NEGATIVE

## 2020-12-18 ENCOUNTER — Observation Stay (HOSPITAL_COMMUNITY)
Admission: RE | Admit: 2020-12-18 | Discharge: 2020-12-19 | Disposition: A | Discharge status: Home / Self Care | Payer: BC Managed Care – PPO | Attending: Orthopaedic Surgery | Admitting: Orthopaedic Surgery

## 2020-12-18 ENCOUNTER — Other Ambulatory Visit: Payer: Self-pay | Admitting: Orthopaedic Surgery

## 2020-12-18 ENCOUNTER — Other Ambulatory Visit: Payer: Self-pay

## 2020-12-18 ENCOUNTER — Ambulatory Visit (HOSPITAL_COMMUNITY): Payer: BC Managed Care – PPO | Admitting: Physician Assistant

## 2020-12-18 ENCOUNTER — Encounter (HOSPITAL_COMMUNITY): Payer: Self-pay | Admitting: Orthopaedic Surgery

## 2020-12-18 ENCOUNTER — Encounter (HOSPITAL_COMMUNITY)
Admission: RE | Disposition: A | Discharge status: Home / Self Care | Payer: Self-pay | Source: Home / Self Care | Attending: Orthopaedic Surgery

## 2020-12-18 ENCOUNTER — Ambulatory Visit (HOSPITAL_COMMUNITY): Payer: BC Managed Care – PPO | Admitting: Anesthesiology

## 2020-12-18 DIAGNOSIS — E782 Mixed hyperlipidemia: Secondary | ICD-10-CM | POA: Diagnosis not present

## 2020-12-18 DIAGNOSIS — I10 Essential (primary) hypertension: Secondary | ICD-10-CM | POA: Insufficient documentation

## 2020-12-18 DIAGNOSIS — Z7982 Long term (current) use of aspirin: Secondary | ICD-10-CM | POA: Insufficient documentation

## 2020-12-18 DIAGNOSIS — I2510 Atherosclerotic heart disease of native coronary artery without angina pectoris: Secondary | ICD-10-CM | POA: Insufficient documentation

## 2020-12-18 DIAGNOSIS — Z96651 Presence of right artificial knee joint: Secondary | ICD-10-CM | POA: Diagnosis not present

## 2020-12-18 DIAGNOSIS — M1712 Unilateral primary osteoarthritis, left knee: Principal | ICD-10-CM | POA: Insufficient documentation

## 2020-12-18 DIAGNOSIS — K219 Gastro-esophageal reflux disease without esophagitis: Secondary | ICD-10-CM | POA: Diagnosis not present

## 2020-12-18 DIAGNOSIS — Z87891 Personal history of nicotine dependence: Secondary | ICD-10-CM | POA: Diagnosis not present

## 2020-12-18 DIAGNOSIS — Z96652 Presence of left artificial knee joint: Secondary | ICD-10-CM

## 2020-12-18 DIAGNOSIS — Z951 Presence of aortocoronary bypass graft: Secondary | ICD-10-CM | POA: Insufficient documentation

## 2020-12-18 DIAGNOSIS — Z01818 Encounter for other preprocedural examination: Secondary | ICD-10-CM

## 2020-12-18 DIAGNOSIS — G8918 Other acute postprocedural pain: Secondary | ICD-10-CM | POA: Diagnosis not present

## 2020-12-18 HISTORY — PX: TOTAL KNEE ARTHROPLASTY: SHX125

## 2020-12-18 SURGERY — ARTHROPLASTY, KNEE, TOTAL
Anesthesia: Spinal | Site: Knee | Laterality: Left

## 2020-12-18 MED ORDER — KETOROLAC TROMETHAMINE 15 MG/ML IJ SOLN
7.5000 mg | Freq: Four times a day (QID) | INTRAMUSCULAR | Status: AC
Start: 2020-12-18 — End: 2020-12-19
  Administered 2020-12-18 – 2020-12-19 (×3): 7.5 mg via INTRAVENOUS
  Filled 2020-12-18 (×3): qty 1

## 2020-12-18 MED ORDER — FENTANYL CITRATE (PF) 100 MCG/2ML IJ SOLN
INTRAMUSCULAR | Status: AC
  Filled 2020-12-18: qty 2

## 2020-12-18 MED ORDER — DEXAMETHASONE SODIUM PHOSPHATE 10 MG/ML IJ SOLN
INTRAMUSCULAR | Status: DC | PRN
  Administered 2020-12-18: 10 mg via INTRAVENOUS

## 2020-12-18 MED ORDER — OXYCODONE HCL 5 MG/5ML PO SOLN: 5.0000 mg | Freq: Once | ORAL | Status: AC | PRN

## 2020-12-18 MED ORDER — METHOCARBAMOL 500 MG PO TABS: 500.0000 mg | ORAL_TABLET | Freq: Four times a day (QID) | ORAL | Status: DC | PRN

## 2020-12-18 MED ORDER — PROPOFOL 500 MG/50ML IV EMUL
INTRAVENOUS | Status: DC | PRN
  Administered 2020-12-18: 100 ug/kg/min via INTRAVENOUS

## 2020-12-18 MED ORDER — ACETAMINOPHEN 325 MG PO TABS: 325.0000 mg | ORAL_TABLET | Freq: Four times a day (QID) | ORAL | Status: DC | PRN

## 2020-12-18 MED ORDER — PHENYLEPHRINE HCL-NACL 10-0.9 MG/250ML-% IV SOLN
INTRAVENOUS | Status: DC | PRN
  Administered 2020-12-18: 40 ug/min via INTRAVENOUS

## 2020-12-18 MED ORDER — ACETAMINOPHEN 500 MG PO TABS
1000.0000 mg | ORAL_TABLET | Freq: Once | ORAL | Status: AC
  Administered 2020-12-18: 1000 mg via ORAL
  Filled 2020-12-18: qty 2

## 2020-12-18 MED ORDER — DOCUSATE SODIUM 100 MG PO CAPS
100.0000 mg | ORAL_CAPSULE | Freq: Two times a day (BID) | ORAL | Status: DC
  Administered 2020-12-18 – 2020-12-19 (×2): 100 mg via ORAL
  Filled 2020-12-18 (×2): qty 1

## 2020-12-18 MED ORDER — PROPOFOL 10 MG/ML IV BOLUS
INTRAVENOUS | Status: AC
  Filled 2020-12-18: qty 20

## 2020-12-18 MED ORDER — ONDANSETRON HCL 4 MG/2ML IJ SOLN: 4.0000 mg | Freq: Four times a day (QID) | INTRAMUSCULAR | Status: DC | PRN

## 2020-12-18 MED ORDER — METOCLOPRAMIDE HCL 5 MG/ML IJ SOLN: 5.0000 mg | Freq: Three times a day (TID) | INTRAMUSCULAR | Status: DC | PRN

## 2020-12-18 MED ORDER — DIPHENHYDRAMINE HCL 12.5 MG/5ML PO ELIX
12.5000 mg | ORAL_SOLUTION | Freq: Every day | ORAL | Status: DC
  Administered 2020-12-18: 12.5 mg via ORAL
  Filled 2020-12-18: qty 5

## 2020-12-18 MED ORDER — MENTHOL 3 MG MT LOZG: 1.0000 | LOZENGE | OROMUCOSAL | Status: DC | PRN

## 2020-12-18 MED ORDER — CLONIDINE HCL (ANALGESIA) 100 MCG/ML EP SOLN
EPIDURAL | Status: DC | PRN
  Administered 2020-12-18: 100 ug

## 2020-12-18 MED ORDER — CHLORHEXIDINE GLUCONATE 0.12 % MT SOLN
15.0000 mL | Freq: Once | OROMUCOSAL | Status: AC
  Administered 2020-12-18: 15 mL via OROMUCOSAL

## 2020-12-18 MED ORDER — BISACODYL 10 MG RE SUPP: 10.0000 mg | Freq: Every day | RECTAL | Status: DC | PRN

## 2020-12-18 MED ORDER — LACTATED RINGERS IV SOLN: INTRAVENOUS | Status: DC

## 2020-12-18 MED ORDER — AMISULPRIDE (ANTIEMETIC) 5 MG/2ML IV SOLN: 10.0000 mg | Freq: Once | INTRAVENOUS | Status: DC | PRN

## 2020-12-18 MED ORDER — ONDANSETRON HCL 4 MG PO TABS: 4.0000 mg | ORAL_TABLET | Freq: Four times a day (QID) | ORAL | Status: DC | PRN

## 2020-12-18 MED ORDER — ROPIVACAINE HCL 5 MG/ML IJ SOLN
INTRAMUSCULAR | Status: DC | PRN
  Administered 2020-12-18: 150 mg via PERINEURAL

## 2020-12-18 MED ORDER — FLEET ENEMA 7-19 GM/118ML RE ENEM: 1.0000 | ENEMA | Freq: Once | RECTAL | Status: DC | PRN

## 2020-12-18 MED ORDER — FLUTICASONE PROPIONATE 50 MCG/ACT NA SUSP
2.0000 | Freq: Every day | NASAL | Status: DC | PRN
  Filled 2020-12-18: qty 16

## 2020-12-18 MED ORDER — PHENOL 1.4 % MT LIQD: 1.0000 | OROMUCOSAL | Status: DC | PRN

## 2020-12-18 MED ORDER — METHOCARBAMOL 500 MG IVPB - SIMPLE MED
500.0000 mg | Freq: Four times a day (QID) | INTRAVENOUS | Status: DC | PRN
  Filled 2020-12-18: qty 50

## 2020-12-18 MED ORDER — ACETAMINOPHEN 10 MG/ML IV SOLN
1000.0000 mg | Freq: Once | INTRAVENOUS | Status: DC | PRN
  Administered 2020-12-18: 1000 mg via INTRAVENOUS

## 2020-12-18 MED ORDER — POLYVINYL ALCOHOL 1.4 % OP SOLN
1.0000 [drp] | Freq: Three times a day (TID) | OPHTHALMIC | Status: DC | PRN
  Filled 2020-12-18: qty 15

## 2020-12-18 MED ORDER — OXYCODONE HCL 5 MG PO TABS
ORAL_TABLET | ORAL | Status: AC
  Administered 2020-12-18: 5 mg via ORAL
  Filled 2020-12-18: qty 1

## 2020-12-18 MED ORDER — KETOROLAC TROMETHAMINE 15 MG/ML IJ SOLN
INTRAMUSCULAR | Status: AC
  Administered 2020-12-18: 7.5 mg via INTRAVENOUS
  Filled 2020-12-18: qty 1

## 2020-12-18 MED ORDER — METOCLOPRAMIDE HCL 5 MG PO TABS: 5.0000 mg | ORAL_TABLET | Freq: Three times a day (TID) | ORAL | Status: DC | PRN

## 2020-12-18 MED ORDER — OXYCODONE HCL 5 MG PO TABS
10.0000 mg | ORAL_TABLET | ORAL | Status: DC | PRN
Start: 2020-12-18 — End: 2020-12-19

## 2020-12-18 MED ORDER — OXYCODONE HCL 5 MG PO TABS
5.0000 mg | ORAL_TABLET | Freq: Once | ORAL | Status: AC | PRN
  Administered 2020-12-18: 5 mg via ORAL

## 2020-12-18 MED ORDER — ACETAMINOPHEN 10 MG/ML IV SOLN
1000.0000 mg | Freq: Four times a day (QID) | INTRAVENOUS | Status: DC
  Filled 2020-12-18: qty 100

## 2020-12-18 MED ORDER — BUPIVACAINE-EPINEPHRINE (PF) 0.25% -1:200000 IJ SOLN
INTRAMUSCULAR | Status: AC
  Filled 2020-12-18: qty 30

## 2020-12-18 MED ORDER — DIPHENHYDRAMINE HCL 12.5 MG/5ML PO ELIX: 12.5000 mg | ORAL_SOLUTION | ORAL | Status: DC | PRN

## 2020-12-18 MED ORDER — SODIUM CHLORIDE 0.9 % IR SOLN
Status: DC | PRN
  Administered 2020-12-18: 1000 mL

## 2020-12-18 MED ORDER — SODIUM CHLORIDE 0.9 % IV SOLN
75.0000 mL/h | INTRAVENOUS | Status: DC
  Administered 2020-12-18: 75 mL/h via INTRAVENOUS

## 2020-12-18 MED ORDER — OXYCODONE HCL 5 MG PO TABS
ORAL_TABLET | ORAL | Status: AC
  Filled 2020-12-18: qty 1

## 2020-12-18 MED ORDER — ALUM & MAG HYDROXIDE-SIMETH 200-200-20 MG/5ML PO SUSP: 30.0000 mL | ORAL | Status: DC | PRN

## 2020-12-18 MED ORDER — ADULT MULTIVITAMIN W/MINERALS CH
1.0000 | ORAL_TABLET | Freq: Every morning | ORAL | Status: DC
  Administered 2020-12-19: 1 via ORAL
  Filled 2020-12-18: qty 1

## 2020-12-18 MED ORDER — PHENYLEPHRINE HCL (PRESSORS) 10 MG/ML IV SOLN
INTRAVENOUS | Status: DC | PRN
  Administered 2020-12-18 (×2): 80 ug via INTRAVENOUS

## 2020-12-18 MED ORDER — FENTANYL CITRATE (PF) 100 MCG/2ML IJ SOLN
INTRAMUSCULAR | Status: DC | PRN
  Administered 2020-12-18: 100 ug via INTRAVENOUS

## 2020-12-18 MED ORDER — METHOCARBAMOL 500 MG IVPB - SIMPLE MED
INTRAVENOUS | Status: AC
  Administered 2020-12-18: 500 mg via INTRAVENOUS
  Filled 2020-12-18: qty 50

## 2020-12-18 MED ORDER — ASPIRIN EC 325 MG PO TBEC
325.0000 mg | DELAYED_RELEASE_TABLET | Freq: Every day | ORAL | Status: DC
  Administered 2020-12-19: 325 mg via ORAL
  Filled 2020-12-18: qty 1

## 2020-12-18 MED ORDER — MAGNESIUM HYDROXIDE 400 MG/5ML PO SUSP: 30.0000 mL | Freq: Every day | ORAL | Status: DC | PRN

## 2020-12-18 MED ORDER — MIDAZOLAM HCL 5 MG/5ML IJ SOLN
INTRAMUSCULAR | Status: DC | PRN
  Administered 2020-12-18: 2 mg via INTRAVENOUS

## 2020-12-18 MED ORDER — ACETAMINOPHEN 10 MG/ML IV SOLN
1000.0000 mg | Freq: Four times a day (QID) | INTRAVENOUS | Status: AC
  Administered 2020-12-18 – 2020-12-19 (×4): 1000 mg via INTRAVENOUS
  Filled 2020-12-18 (×4): qty 100

## 2020-12-18 MED ORDER — VANCOMYCIN HCL IN DEXTROSE 1-5 GM/200ML-% IV SOLN
1000.0000 mg | Freq: Two times a day (BID) | INTRAVENOUS | Status: AC
  Administered 2020-12-18: 1000 mg via INTRAVENOUS
  Filled 2020-12-18: qty 200

## 2020-12-18 MED ORDER — TRANEXAMIC ACID-NACL 1000-0.7 MG/100ML-% IV SOLN
1000.0000 mg | INTRAVENOUS | Status: AC
  Administered 2020-12-18: 1000 mg via INTRAVENOUS
  Filled 2020-12-18: qty 100

## 2020-12-18 MED ORDER — DILTIAZEM HCL ER COATED BEADS 240 MG PO CP24
240.0000 mg | ORAL_CAPSULE | Freq: Every morning | ORAL | Status: DC
  Administered 2020-12-19: 240 mg via ORAL
  Filled 2020-12-18: qty 1

## 2020-12-18 MED ORDER — ACETAMINOPHEN 10 MG/ML IV SOLN
INTRAVENOUS | Status: AC
  Filled 2020-12-18: qty 100

## 2020-12-18 MED ORDER — BUPIVACAINE-EPINEPHRINE (PF) 0.25% -1:200000 IJ SOLN
INTRAMUSCULAR | Status: DC | PRN
  Administered 2020-12-18: 30 mL

## 2020-12-18 MED ORDER — BUPIVACAINE IN DEXTROSE 0.75-8.25 % IT SOLN
INTRATHECAL | Status: DC | PRN
  Administered 2020-12-18: 12.75 mg via INTRATHECAL

## 2020-12-18 MED ORDER — OXYCODONE HCL 5 MG PO TABS
5.0000 mg | ORAL_TABLET | ORAL | Status: DC | PRN
  Administered 2020-12-18 – 2020-12-19 (×4): 10 mg via ORAL
  Filled 2020-12-18 (×4): qty 2

## 2020-12-18 MED ORDER — ASPIRIN EC 81 MG PO TBEC: 81.0000 mg | DELAYED_RELEASE_TABLET | Freq: Two times a day (BID) | ORAL | Status: DC

## 2020-12-18 MED ORDER — VANCOMYCIN HCL IN DEXTROSE 1-5 GM/200ML-% IV SOLN
1000.0000 mg | INTRAVENOUS | Status: AC
  Administered 2020-12-18: 1000 mg via INTRAVENOUS
  Filled 2020-12-18: qty 200

## 2020-12-18 MED ORDER — ONDANSETRON HCL 4 MG/2ML IJ SOLN: 4.0000 mg | Freq: Three times a day (TID) | INTRAMUSCULAR | Status: DC | PRN

## 2020-12-18 MED ORDER — HYDROMORPHONE HCL 1 MG/ML IJ SOLN: 0.2500 mg | INTRAMUSCULAR | Status: DC | PRN

## 2020-12-18 MED ORDER — MIDAZOLAM HCL 2 MG/2ML IJ SOLN
INTRAMUSCULAR | Status: AC
  Filled 2020-12-18: qty 2

## 2020-12-18 MED ORDER — ORAL CARE MOUTH RINSE: 15.0000 mL | Freq: Once | OROMUCOSAL | Status: AC

## 2020-12-18 MED ORDER — SODIUM CHLORIDE 0.9 % IV SOLN: INTRAVENOUS | Status: DC

## 2020-12-18 MED ORDER — PHENYLEPHRINE HCL (PRESSORS) 10 MG/ML IV SOLN
INTRAVENOUS | Status: AC
  Filled 2020-12-18: qty 1

## 2020-12-18 MED ORDER — ONDANSETRON HCL 4 MG/2ML IJ SOLN: 4.0000 mg | Freq: Once | INTRAMUSCULAR | Status: DC | PRN

## 2020-12-18 MED ORDER — 0.9 % SODIUM CHLORIDE (POUR BTL) OPTIME
TOPICAL | Status: DC | PRN
  Administered 2020-12-18: 1000 mL

## 2020-12-18 SURGICAL SUPPLY — 58 items
APL PRP STRL LF ISPRP CHG 10.5 (MISCELLANEOUS) ×2
APPLICATOR CHLORAPREP 10.5 ORG (MISCELLANEOUS) ×2 IMPLANT
BAG COUNTER SPONGE SURGICOUNT (BAG) IMPLANT
BAG DECANTER FOR FLEXI CONT (MISCELLANEOUS) ×2 IMPLANT
BAG SPEC THK2 15X12 ZIP CLS (MISCELLANEOUS) ×1
BAG SPNG CNTER NS LX DISP (BAG)
BAG ZIPLOCK 12X15 (MISCELLANEOUS) ×2 IMPLANT
BLADE SAGITTAL 25.0X1.19X90 (BLADE) ×2 IMPLANT
BOWL SMART MIX CTS (DISPOSABLE) ×2 IMPLANT
CEMENT HV SMART SET (Cement) ×3 IMPLANT
CEMENT TIBIA MBT SIZE 4 (Knees) IMPLANT
COMP FEM CEM STD+ LT LCS (Orthopedic Implant) ×2 IMPLANT
COMP PATELLA PEGX3 CEM STAN+ (Knees) ×2 IMPLANT
COMPONENT FEM CEM STD+ LT LCS (Orthopedic Implant) IMPLANT
COMPONENT PTLA PEGX3 CEM STAN+ (Knees) IMPLANT
COVER SURGICAL LIGHT HANDLE (MISCELLANEOUS) ×2 IMPLANT
CUFF TOURN SGL QUICK 34 (TOURNIQUET CUFF) ×2
CUFF TRNQT CYL 34X4.125X (TOURNIQUET CUFF) ×1 IMPLANT
DECANTER SPIKE VIAL GLASS SM (MISCELLANEOUS) ×2 IMPLANT
DRAPE IMP U-DRAPE 54X76 (DRAPES) ×2 IMPLANT
DRAPE ORTHO SPLIT 77X108 STRL (DRAPES)
DRAPE SHEET LG 3/4 BI-LAMINATE (DRAPES) ×4 IMPLANT
DRAPE SURG ORHT 6 SPLT 77X108 (DRAPES) IMPLANT
DRSG ADAPTIC 3X8 NADH LF (GAUZE/BANDAGES/DRESSINGS) ×2 IMPLANT
DRSG PAD ABDOMINAL 8X10 ST (GAUZE/BANDAGES/DRESSINGS) ×2 IMPLANT
DURAPREP 26ML APPLICATOR (WOUND CARE) ×2 IMPLANT
ELECT REM PT RETURN 15FT ADLT (MISCELLANEOUS) ×2 IMPLANT
GAUZE SPONGE 4X4 12PLY STRL (GAUZE/BANDAGES/DRESSINGS) ×2 IMPLANT
GLOVE SRG 8 PF TXTR STRL LF DI (GLOVE) ×1 IMPLANT
GLOVE SURG LTX SZ8 (GLOVE) ×4 IMPLANT
GLOVE SURG LTX SZ8.5 (GLOVE) ×4 IMPLANT
GLOVE SURG UNDER POLY LF SZ8 (GLOVE) ×2
GLOVE SURG UNDER POLY LF SZ8.5 (GLOVE) ×2 IMPLANT
GOWN STRL REUS W/ TWL LRG LVL3 (GOWN DISPOSABLE) ×1 IMPLANT
GOWN STRL REUS W/TWL 2XL LVL3 (GOWN DISPOSABLE) ×2 IMPLANT
GOWN STRL REUS W/TWL LRG LVL3 (GOWN DISPOSABLE) ×2
HANDPIECE INTERPULSE COAX TIP (DISPOSABLE) ×2
HOLDER FOLEY CATH W/STRAP (MISCELLANEOUS) IMPLANT
INSERT TIB LCS RP STD+ 10 (Knees) ×1 IMPLANT
KIT TURNOVER KIT A (KITS) ×2 IMPLANT
MANIFOLD NEPTUNE II (INSTRUMENTS) ×2 IMPLANT
NS IRRIG 1000ML POUR BTL (IV SOLUTION) ×2 IMPLANT
PACK TOTAL KNEE CUSTOM (KITS) ×2 IMPLANT
PADDING CAST COTTON 6X4 STRL (CAST SUPPLIES) ×3 IMPLANT
PENCIL SMOKE EVACUATOR (MISCELLANEOUS) IMPLANT
PIN STEINMAN FIXATION KNEE (PIN) ×1 IMPLANT
PROTECTOR NERVE ULNAR (MISCELLANEOUS) ×2 IMPLANT
SET HNDPC FAN SPRY TIP SCT (DISPOSABLE) ×1 IMPLANT
STAPLER VISISTAT 35W (STAPLE) ×2 IMPLANT
SUT BONE WAX W31G (SUTURE) ×2 IMPLANT
SUT ETHIBOND NAB CT1 #1 30IN (SUTURE) ×4 IMPLANT
SUT MNCRL AB 3-0 PS2 18 (SUTURE) ×2 IMPLANT
SUT VIC AB 2-0 PS2 27 (SUTURE) ×2 IMPLANT
TIBIA MBT CEMENT SIZE 4 (Knees) ×2 IMPLANT
TRAY FOLEY MTR SLVR 16FR STAT (SET/KITS/TRAYS/PACK) ×1 IMPLANT
UNDERPAD 30X36 HEAVY ABSORB (UNDERPADS AND DIAPERS) ×1 IMPLANT
WATER STERILE IRR 1000ML POUR (IV SOLUTION) ×4 IMPLANT
WRAP KNEE MAXI GEL POST OP (GAUZE/BANDAGES/DRESSINGS) ×2 IMPLANT

## 2020-12-18 NOTE — Evaluation (Signed)
Physical Therapy Evaluation Patient Details Name: Philip Winters MRN: [id number] DOB: 1960/11/04 Today's Date: 12/18/2020   History of Present Illness  Patient is 60 y.o. male s/p Lt TKA on 12/18/20 with PMH significant for CAD, OA, GERD, HTN, PNA, CABG on 05/01/20, Rt TKA in 2018.    Clinical Impression  Philip Winters is a 60 y.o. male POD 0 s/p Lt TKA. Patient reports independence with mobility at baseline. Patient is now limited by functional impairments (see PT problem list below) and requires min guard/assist for transfers and gait with RW. Patient was able to ambulate 80 feet with RW and min guard/assist. Patient instructed in exercise to facilitate ROM and circulation to manage edema to reduce risk of DVT. Patient will benefit from continued skilled PT interventions to address impairments and progress towards PLOF. Acute PT will follow to progress mobility and stair training in preparation for safe discharge home.     Follow Up Recommendations Follow surgeon's recommendation for DC plan and follow-up therapies;Outpatient PT    Equipment Recommendations  None recommended by PT    Recommendations for Other Services       Precautions / Restrictions Precautions Precautions: Fall Restrictions Weight Bearing Restrictions: No Other Position/Activity Restrictions: WBAT      Mobility  Bed Mobility Overal bed mobility: Needs Assistance Bed Mobility: Supine to Sit     Supine to sit: Supervision;HOB elevated     General bed mobility comments: cues to reach to be rail and supervision for safety.    Transfers Overall transfer level: Needs assistance Equipment used: Rolling walker (2 wheeled) Transfers: Sit to/from Stand Sit to Stand: Min assist;Min guard         General transfer comment: cues for hand placement/technique, assist for power up from EOB.  Ambulation/Gait Ambulation/Gait assistance: Min assist;Min guard Gait Distance (Feet): 80 Feet Assistive device: Rolling  walker (2 wheeled) Gait Pattern/deviations: Step-to pattern;Decreased stride length;Shuffle Gait velocity: decr   General Gait Details: cues for step to pattern, no overt LOB noted, no buckling at Lt knee. pt progressed to min guard wtih cues for walker positioning.  Stairs            Wheelchair Mobility    Modified Rankin (Stroke Patients Only)       Balance Overall balance assessment: Needs assistance Sitting-balance support: Feet supported Sitting balance-Leahy Scale: Good     Standing balance support: During functional activity;Bilateral upper extremity supported Standing balance-Leahy Scale: Fair                               Pertinent Vitals/Pain Pain Assessment: 0-10 Pain Score: 5  Pain Location: Lt knee Pain Descriptors / Indicators: Aching;Discomfort Pain Intervention(s): Limited activity within patient's tolerance;Monitored during session;Repositioned;Ice applied    Home Living Family/patient expects to be discharged to:: Private residence Living Arrangements: Spouse/significant other Available Help at Discharge: Family Type of Home: House Home Access: Stairs to enter Entrance Stairs-Rails: None Entrance Stairs-Number of Steps: 1 Home Layout: One level Home Equipment: Environmental consultant - 2 wheels;Cane - single point;Bedside commode;Shower seat      Prior Function Level of Independence: Independent               Hand Dominance   Dominant Hand: Right    Extremity/Trunk Assessment   Upper Extremity Assessment Upper Extremity Assessment: Overall WFL for tasks assessed    Lower Extremity Assessment Lower Extremity Assessment: LLE deficits/detail LLE Deficits / Details: good quad  activaiton, no extensor lag with SLR LLE Sensation: WNL LLE Coordination: WNL    Cervical / Trunk Assessment Cervical / Trunk Assessment: Normal  Communication   Communication: No difficulties  Cognition Arousal/Alertness: Awake/alert Behavior During  Therapy: WFL for tasks assessed/performed Overall Cognitive Status: Within Functional Limits for tasks assessed                                        General Comments      Exercises Total Joint Exercises Ankle Circles/Pumps: AROM;Both;20 reps Quad Sets: AROM;Left;10 reps;Seated Heel Slides: AROM;Left;5 reps;Seated   Assessment/Plan    PT Assessment Patient needs continued PT services  PT Problem List Decreased strength;Decreased range of motion;Decreased activity tolerance;Decreased balance;Decreased mobility;Decreased safety awareness;Decreased knowledge of use of DME;Decreased knowledge of precautions       PT Treatment Interventions DME instruction;Gait training;Stair training;Functional mobility training;Therapeutic activities;Therapeutic exercise;Balance training;Patient/family education    PT Goals (Current goals can be found in the Care Plan section)  Acute Rehab PT Goals Patient Stated Goal: regain independence to stay active PT Goal Formulation: With patient Time For Goal Achievement: 12/25/20 Potential to Achieve Goals: Good    Frequency 7X/week   Barriers to discharge        Co-evaluation               AM-PAC PT "6 Clicks" Mobility  Outcome Measure Help needed turning from your back to your side while in a flat bed without using bedrails?: A Little Help needed moving from lying on your back to sitting on the side of a flat bed without using bedrails?: A Little Help needed moving to and from a bed to a chair (including a wheelchair)?: A Little Help needed standing up from a chair using your arms (e.g., wheelchair or bedside chair)?: A Little Help needed to walk in hospital room?: A Little Help needed climbing 3-5 steps with a railing? : A Little 6 Click Score: 18    End of Session Equipment Utilized During Treatment: Gait belt Activity Tolerance: Patient tolerated treatment well Patient left: in chair;with call bell/phone within  reach;with family/visitor present (no chair alarm box in room) Nurse Communication: Mobility status PT Visit Diagnosis: Muscle weakness (generalized) (M62.81);Difficulty in walking, not elsewhere classified (R26.2)    Time: [id number] PT Time Calculation (min) (ACUTE ONLY): 19 min   Charges:   PT Evaluation $PT Eval Low Complexity: 1 Low          Verner Mould, DPT Acute Rehabilitation Services Office [phone] Pager [phone]   Jacques Navy 12/18/2020, 4:00 PM

## 2020-12-18 NOTE — Plan of Care (Signed)
  Problem: Health Behavior/Discharge Planning: Goal: Ability to manage health-related needs will improve Outcome: Progressing   Problem: Clinical Measurements: Goal: Ability to maintain clinical measurements within normal limits will improve Outcome: Progressing   Problem: Activity: Goal: Risk for activity intolerance will decrease Outcome: Progressing   Problem: Nutrition: Goal: Adequate nutrition will be maintained Outcome: Progressing   Problem: Pain Managment: Goal: General experience of comfort will improve Outcome: Progressing   

## 2020-12-18 NOTE — Op Note (Signed)
PATIENT ID:      DMAURI HUSSEIN  MRN:     [id number] DOB/AGE:    06/17/1960 / 60 y.o.       OPERATIVE REPORT    DATE OF PROCEDURE:  12/18/2020       PREOPERATIVE DIAGNOSIS:   end stage osteoarthritis left knee                                                       Estimated body mass index is 23.26 kg/m as calculated from the following:   Height as of this encounter: '5\' 8"'$  (1.727 m).   Weight as of this encounter: 69.4 kg.     POSTOPERATIVE DIAGNOSIS:  same                                                               Estimated body mass index is 23.26 kg/m as calculated from the following:   Height as of this encounter: '5\' 8"'$  (1.727 m).   Weight as of this encounter: 69.4 kg.     PROCEDURE:  Procedure(s): LEFT TOTAL KNEE ARTHROPLASTY      SURGEON:  Joni Fears, MD    ASSISTANT:   Biagio Borg, PA-C   (Present and scrubbed throughout the case, critical for assistance with exposure, retraction, instrumentation, and closure.)          ANESTHESIA: regional, spinal, and IV sedation     DRAINS: none :      TOURNIQUET TIME:  Total Tourniquet Time Documented: Thigh (Left) - 68 minutes Total: Thigh (Left) - 68 minutes     COMPLICATIONS:  None   CONDITION:  stable  PROCEDURE IN DETAIL: [id number]   Garald Balding 12/18/2020, 9:35 AM

## 2020-12-18 NOTE — Anesthesia Procedure Notes (Signed)
Spinal  Patient location during procedure: OR Start time: 12/18/2020 7:31 AM End time: 12/18/2020 7:37 AM Reason for block: surgical anesthesia Staffing Anesthesiologist: Barnet Glasgow, MD Preanesthetic Checklist Completed: patient identified, IV checked, site marked, risks and benefits discussed, surgical consent, monitors and equipment checked, pre-op evaluation and timeout performed Spinal Block Patient position: sitting Prep: DuraPrep Patient monitoring: heart rate, cardiac monitor, continuous pulse ox and blood pressure Approach: midline Location: L3-4 Injection technique: single-shot Needle Needle type: Sprotte  Needle gauge: 24 G Needle length: 9 cm Needle insertion depth: 6 cm Assessment Sensory level: T4 Events: CSF return Additional Notes I attempt at L3-4 2nd attempt at L2-3 successful

## 2020-12-18 NOTE — Transfer of Care (Signed)
Immediate Anesthesia Transfer of Care Note  Patient: Philip Winters  Procedure(s) Performed: LEFT TOTAL KNEE ARTHROPLASTY (Left: Knee)  Patient Location: PACU  Anesthesia Type:Regional and Spinal  Level of Consciousness: awake, alert  and oriented  Airway & Oxygen Therapy: Patient spontaneously breathing  Post-op Assessment: Report given to RN and Post -op Vital signs reviewed and stable  Post vital signs: Reviewed and stable  Last Vitals:  Vitals Value Taken Time  BP 101/70 12/18/20 1000  Temp 36.9 C 12/18/20 0954  Pulse 92 12/18/20 1001  Resp 23 12/18/20 1001  SpO2 95 % 12/18/20 1001  Vitals shown include unvalidated device data.  Last Pain:  Vitals:   12/18/20 0954  TempSrc:   PainSc: 0-No pain      Patients Stated Pain Goal: 6 ([id number])  Complications: No notable events documented.

## 2020-12-18 NOTE — Anesthesia Procedure Notes (Addendum)
Anesthesia Regional Block: Adductor canal block   Pre-Anesthetic Checklist: , timeout performed,  Correct Patient, Correct Site, Correct Laterality,  Correct Procedure, Correct Position, site marked,  Risks and benefits discussed,  Surgical consent,  Pre-op evaluation,  At surgeon's request and post-op pain management  Laterality: Lower and Left  Prep: chloraprep       Needles:  Injection technique: Single-shot  Needle Type: Echogenic Needle     Needle Length: 9cm  Needle Gauge: 22     Additional Needles:   Procedures:,,,, ultrasound used (permanent image in chart),,    Narrative:  Start time: 12/18/2020 6:52 AM End time: 12/18/2020 6:59 AM Injection made incrementally with aspirations every 5 mL.  Performed by: Personally  Anesthesiologist: Barnet Glasgow, MD  Additional Notes: Block assessed prior to surgery. Pt tolerated procedure well.

## 2020-12-18 NOTE — H&P (Signed)
The recent History & Physical has been reviewed. I have personally examined the patient today. There is no interval change to the documented History & Physical. The patient would like to proceed with the procedure.  Garald Balding 12/18/2020,  7:10 AM

## 2020-12-18 NOTE — Anesthesia Postprocedure Evaluation (Signed)
Anesthesia Post Note  Patient: Philip Winters  Procedure(s) Performed: LEFT TOTAL KNEE ARTHROPLASTY (Left: Knee)     Patient location during evaluation: PACU Anesthesia Type: Spinal Level of consciousness: awake and alert Pain management: pain level controlled Vital Signs Assessment: post-procedure vital signs reviewed and stable Respiratory status: spontaneous breathing, nonlabored ventilation, respiratory function stable and patient connected to nasal cannula oxygen Cardiovascular status: blood pressure returned to baseline and stable Postop Assessment: no apparent nausea or vomiting Anesthetic complications: no   No notable events documented.  Last Vitals:  Vitals:   12/18/20 1015 12/18/20 1030  BP: 98/71 101/71  Pulse: 96 84  Resp: 19 16  Temp:    SpO2: 96% 95%    Last Pain:  Vitals:   12/18/20 1030  TempSrc:   PainSc: 0-No pain                 Barnet Glasgow

## 2020-12-18 NOTE — Anesthesia Procedure Notes (Signed)
Procedure Name: MAC Date/Time: 12/18/2020 7:45 AM Performed by: Jari Pigg, CRNA Pre-anesthesia Checklist: Patient identified, Emergency Drugs available, Suction available and Patient being monitored Patient Re-evaluated:Patient Re-evaluated prior to induction Oxygen Delivery Method: Simple face mask

## 2020-12-18 NOTE — Op Note (Signed)
Philip Winters, Philip Winters MEDICAL RECORD NO: [id number] ACCOUNT NO: [id number] DATE OF BIRTH: 04-02-1961 FACILITY: [facility] LOCATION: [facility] PHYSICIAN: Vonna Kotyk. Durward Fortes, MD  Operative Report   DATE OF PROCEDURE: 12/18/2020   PREOPERATIVE DIAGNOSIS:  End-stage osteoarthritis, left knee.  POSTOPERATIVE DIAGNOSIS:  End-stage osteoarthritis, left knee.  PROCEDURE:  Left total knee replacement.  SURGEON:  Dr. Vonna Kotyk. Selvin Yun.  ASSISTANT:  Biagio Borg, PA-C.  ANESTHESIA:  Spinal with IV sedation and adductor canal block.  COMPLICATIONS:  None.  COMPONENTS:  DePuy LCS standard plus femoral component #4 rotating keeled tibial tray with a 10 mm polyethylene bridging bearing and a metal backed 3 peg rotating patella.  Components were secured with polymethyl methacrylate.  DESCRIPTION OF PROCEDURE:  The patient was met in the holding area, identified the left knee as the appropriate operative site and marked it accordingly.  Anesthesia performed an adductor canal block.  The patient was then transported to room #5.   Anesthesia performed spinal anesthetic without complication.  The patient was then placed comfortably in the supine position on the operating table.  A tourniquet was applied to the left thigh.  The left lower extremity was then prepared with chlorhexidine scrub for over 3 minutes and then ChloraPrep x2 from the  tourniquet to the tips of the toes.  Sterile draping was performed.  After the timeout, the leg was elevated, the Esmarch exsanguinated with a proximal tourniquet at 325 mmHg.  A midline longitudinal incision was then made centered about the patella extending from the superior pouch to the tibial tubercle.  Via sharp dissection, the incision was carried down to subcutaneous tissue.  First layer of capsule was incised in the  midline.  A medial parapatellar incision was then made with the Bovie.  The joint was entered.  There was a large clear yellow joint effusion.   Patella was everted 180 degrees laterally and the knee flexed to 90 degrees.  There was abundant amount of  synovitis.  A rigorous synovectomy was performed.  There were moderately large osteophytes along the medial and lateral femoral condyles.  These were removed and I measured a standard plus femoral component.  First, bony cut was then made transversely in the proximal tibia using the external tibial guide with an 8-degree angle of declination.  Subsequent cuts were then made on the femur using the standard plus jig.  After each bony cut I checked to be sure we  had normal alignment with the external jig.  Lamina spreaders were then inserted along the medial and lateral compartment.  I performed the medial, lateral meniscectomy and removed the ACL and PCL.  Osteophytes removed the posterior femoral condyle with a 3/4 inch curved osteotome.  There is a  Comptroller cyst medially.  What I could visualize I debrided.  There were several small loose bodies measuring less than 3 mm.  Final cuts were then made on the femur using 4-degree distal valgus and then the final tapering cuts in the central hole with the standard plus component.  Retractors were then placed about the tibia.  It was advanced anteriorly.  We measured a #4 tibial tray.  This was pinned in place.  Center hole was then made followed by the keel cut.  With the tibial jig in place, the 10 mm polyethylene bridging  bearing was applied.  Flexion and extension gaps were perfectly symmetrical at 10 mm.  MCL and LCL remained intact.  The trial standard plus femoral component was then placed.  The entire construct was reduced and through full range of motion we had full extension and no opening with a varus or valgus stress.  The patella was prepared by removing 10 mm of bone, leaving 13 mm of patellar thickness.  Patellar jig was applied.  Three holes were made and the trial patella inserted and reduced.  Again, through a full range of  motion, it remained stable and in  place.  The trial components were then removed.  The joint was copiously irrigated with saline solution.  The final components were then impacted with polymethyl methacrylate.  We initially applied the tibial tray #4, followed by the 10 mm polyethylene bridging  bearing in the standard plus femoral component.  After impaction of the components, we removed any extraneous methacrylate from the periphery of the components.  Patella was applied with the patellar clamp and methacrylate.  Methacrylate was removed from its periphery as well.  At approximately 16 minutes of methacrylate had matured during this time, we performed further synovectomy and injected the deep capsule with 0.25% Marcaine with epinephrine.  The tourniquet was then released at 68 minutes.  We had nice bleeding to the joint surface.  Gross bleeding was easily controlled with the Bovie.  He did receive a gram of TXA preoperatively.  He also received a gram of vancomycin.  With a nice dry field and after further irrigation with saline, the deep capsule was closed with a running #1 Ethibond suture.  Superficial capsule closed with 2-0 Vicryl, the subQ with 3-0 Monocryl, skin closed with skin clips.  Sterile bulky dressing  was applied.  The patient tolerated the procedure without complications, returned to the postanesthesia recovery room without problem.     Elián.Darby D: 12/18/2020 9:43:58 am T: 12/18/2020 10:10:00 am  JOB: [id number]/ [id number]

## 2020-12-19 ENCOUNTER — Other Ambulatory Visit: Payer: Self-pay | Admitting: Orthopedic Surgery

## 2020-12-19 ENCOUNTER — Telehealth: Payer: Self-pay | Admitting: Orthopaedic Surgery

## 2020-12-19 ENCOUNTER — Encounter (HOSPITAL_COMMUNITY): Payer: Self-pay | Admitting: Orthopaedic Surgery

## 2020-12-19 DIAGNOSIS — Z87891 Personal history of nicotine dependence: Secondary | ICD-10-CM | POA: Diagnosis not present

## 2020-12-19 DIAGNOSIS — Z7982 Long term (current) use of aspirin: Secondary | ICD-10-CM | POA: Diagnosis not present

## 2020-12-19 DIAGNOSIS — Z96652 Presence of left artificial knee joint: Secondary | ICD-10-CM | POA: Diagnosis not present

## 2020-12-19 DIAGNOSIS — I2510 Atherosclerotic heart disease of native coronary artery without angina pectoris: Secondary | ICD-10-CM | POA: Diagnosis not present

## 2020-12-19 DIAGNOSIS — I10 Essential (primary) hypertension: Secondary | ICD-10-CM | POA: Diagnosis not present

## 2020-12-19 DIAGNOSIS — Z96651 Presence of right artificial knee joint: Secondary | ICD-10-CM | POA: Diagnosis not present

## 2020-12-19 DIAGNOSIS — M1712 Unilateral primary osteoarthritis, left knee: Secondary | ICD-10-CM | POA: Diagnosis not present

## 2020-12-19 DIAGNOSIS — Z951 Presence of aortocoronary bypass graft: Secondary | ICD-10-CM | POA: Diagnosis not present

## 2020-12-19 LAB — BASIC METABOLIC PANEL
Anion gap: 6 (ref 5–15)
BUN: 9 mg/dL (ref 8–23)
CO2: 26 mmol/L (ref 22–32)
Calcium: 9.1 mg/dL (ref 8.9–10.3)
Chloride: 104 mmol/L (ref 98–111)
Creatinine, Ser: 0.42 mg/dL — ABNORMAL LOW (ref 0.61–1.24)
GFR, Estimated: >60 mL/min (ref >60)
Glucose, Bld: 176 mg/dL — ABNORMAL HIGH (ref 70–99)
Potassium: 4.7 mmol/L (ref 3.5–5.1)
Sodium: 136 mmol/L (ref 135–145)

## 2020-12-19 MED ORDER — OXYCODONE HCL 5 MG PO TABS: 5.0000 mg | ORAL_TABLET | ORAL | 0 refills | Status: DC | PRN

## 2020-12-19 MED ORDER — ONDANSETRON 4 MG PO TBDP: 4.0000 mg | ORAL_TABLET | Freq: Three times a day (TID) | ORAL | 0 refills | Status: DC | PRN

## 2020-12-19 MED ORDER — METHOCARBAMOL 500 MG PO TABS: 500.0000 mg | ORAL_TABLET | Freq: Three times a day (TID) | ORAL | 0 refills | Status: DC | PRN

## 2020-12-19 MED ORDER — ACETAMINOPHEN 325 MG PO TABS: 650.0000 mg | ORAL_TABLET | Freq: Four times a day (QID) | ORAL | 0 refills | Status: DC | PRN

## 2020-12-19 MED ORDER — ASPIRIN EC 81 MG PO TBEC: 81.0000 mg | DELAYED_RELEASE_TABLET | Freq: Two times a day (BID) | ORAL | 3 refills | Status: DC

## 2020-12-19 NOTE — Telephone Encounter (Signed)
Pt called requesting a call back from [place]. Pt states he is being discharged from [facility] after surgery and called pharmacy for prescriptions and they haven't been sent in yet and would like to know if they will be before he is discharged. Please call pt at [phone].

## 2020-12-19 NOTE — Progress Notes (Signed)
Physical Therapy Treatment Patient Details Name: Philip Winters MRN: [id number] DOB: 1960/09/22 Today's Date: 12/19/2020    History of Present Illness Patient is 60 y.o. male s/p Lt TKA on 12/18/20 with PMH significant for CAD, OA, GERD, HTN, PNA, CABG on 05/01/20, Rt TKA in 2018.    PT Comments    POD # 1 am session.  Assisted OOB.  General bed mobility comments: demonstarted and instructed how to use a belt to self assist LE however pt was able self.  General transfer comment: <25% cues for hand placement/technique, assist for power up from EOB.  good safety cognition.General Gait Details: one VC on proper walker to self distabce.  Good safety cognition.  Tolerated a functional distance. General stair comments: 25% VC's on proper walker placement and sequencing pt able to perform safely/correctly. Then returned to room to perform some TE's following HEP handout.  Instructed on proper tech, freq as well as use of ICE.   Addressed all mobility questions, discussed appropriate activity, educated on use of ICE.  Pt ready for D/C to home.   Follow Up Recommendations  Follow surgeon's recommendation for DC plan and follow-up therapies;Outpatient PT     Equipment Recommendations  None recommended by PT    Recommendations for Other Services       Precautions / Restrictions Precautions Precautions: Fall Precaution Comments: instructed no pillow under knee Restrictions Weight Bearing Restrictions: No Other Position/Activity Restrictions: WBAT    Mobility  Bed Mobility Overal bed mobility: Needs Assistance Bed Mobility: Supine to Sit           General bed mobility comments: demonstarted and instructed how to use a belt to self assist LE however pt was able self    Transfers Overall transfer level: Needs assistance Equipment used: Rolling walker (2 wheeled) Transfers: Sit to/from [facility] Sit to Stand: Supervision Stand pivot transfers: Supervision        General transfer comment: <25% cues for hand placement/technique, assist for power up from EOB.  good safety cognition.  Ambulation/Gait Ambulation/Gait assistance: Supervision Gait Distance (Feet): 75 Feet Assistive device: Rolling walker (2 wheeled) Gait Pattern/deviations: Step-to pattern;Decreased stride length;Shuffle Gait velocity: decreased   General Gait Details: one VC on proper walker to self distabce.  Good safety cognition.  Tolerated a functional distance.   Stairs Stairs: Yes Stairs assistance: Supervision Stair Management: No rails;Step to pattern;Forwards;With walker Number of Stairs: 1 General stair comments: 25% VC's on proper walker placement and sequencing pt able to perform safely/correctly.   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, music TE's following HEP handout 10 reps B LE ankle pumps 05 reps towel squeezes 05 reps knee presses 05 reps heel slides  05 reps SAQ's 05 reps SLR's 05 reps ABD Educated on use of gait  belt to assist with TE's Followed by ICE     General Comments        Pertinent Vitals/Pain Pain Assessment: 0-10 Pain Score: 5  Pain Location: Lt knee Pain Descriptors / Indicators: Aching;Discomfort;Operative site guarding Pain Intervention(s): Monitored during session;Premedicated before session;Repositioned;Ice applied    Home Living                      Prior Function            PT Goals (current goals can now be found in the care plan section) Progress towards PT goals: Progressing toward goals     Frequency    7X/week      PT Plan Current plan remains appropriate    Co-evaluation              AM-PAC PT "6 Clicks" Mobility   Outcome Measure  Help needed turning from your back to your side while in a flat bed without using bedrails?: A Little Help needed moving from lying on your back to sitting on the side of a flat bed without using bedrails?: A Little Help needed moving to and from a bed to a chair (including a wheelchair)?: A Little Help needed standing up from a chair using your arms (e.g., wheelchair or bedside chair)?: A Little Help needed to walk in hospital room?: A Little Help needed climbing 3-5 steps with a railing? : A Little 6 Click Score: 18    End of Session Equipment Utilized During Treatment: Gait belt Activity Tolerance: Patient tolerated treatment well Patient left: in chair;with call bell/phone within reach;with family/visitor present Nurse Communication: Mobility status (pt ready for D/C to home with just one session) PT Visit Diagnosis: Muscle weakness (generalized) (M62.81);Difficulty in walking, not elsewhere classified (R26.2)     Time: [id number] PT Time Calculation (min) (ACUTE ONLY): 25 min  Charges:  $Gait Training: 8-22 mins $Therapeutic Exercise: 8-22 mins                     {Deante Blough  PTA Acute  Rehabilitation Services Pager      [phone] Office      [phone]

## 2020-12-19 NOTE — TOC Transition Note (Signed)
Transition of Care [facility]) - CM/SW Discharge Note   Patient Details  Name: Philip Winters MRN: [id number] Date of Birth: 1960/05/27  Transition of Care [facility]) CM/SW Contact:  Lennart Pall, LCSW Phone Number: 12/19/2020, 10:17 AM   Clinical Narrative:    Met with pt to review dc needs.  Pt reports that he does need a rolling walker (no agency pref) and order placed with [employer].  Pt pre-set up with [facility] for HHPT.  No further TOC needs.   Final next level of care: [place] Barriers to Discharge: No Barriers Identified   Patient Goals and CMS Choice Patient states their goals for this hospitalization and ongoing recovery are:: return home      Discharge Placement                       Discharge Plan and Services                DME Arranged: Walker rolling DME Agency: [employer] Date DME Agency Contacted: 12/19/20 Time DME Agency Contacted: [id number] Representative spoke with at DME Agency: [place]: PT [place]: [place] Date [facility] Agency Contacted:  (referral placed prior to surgery)      Social Determinants of Health (SDOH) Interventions     Readmission Risk Interventions No flowsheet data found.

## 2020-12-19 NOTE — Op Note (Signed)
PATIENT ID: ULESS OGREN        MRN:  [id number]          DOB/AGE: Jan 26, 1961 / 60 y.o.    Joni Fears, MD   Biagio Borg, PA-C [address] [place], [place]  [address]                             [phone]   PROGRESS NOTE  Subjective:  negative for Chest Pain  negative for Shortness of Breath  negative for Nausea/Vomiting   negative for Calf Pain    Tolerating Diet: yes         Patient reports pain as mild and moderate.     Adductor canal block still partially intact  Objective: Vital signs in last 24 hours:   Patient Vitals for the past 24 hrs:  BP Temp Temp src Pulse Resp SpO2  12/19/20 0544 131/90 97.7 F (36.5 C) Oral (!) 110 18 96 %  12/19/20 0049 117/76 97.6 F (36.4 C) Oral 92 16 95 %  12/18/20 2109 129/82 (!) 97.5 F (36.4 C) Oral 86 20 97 %  12/18/20 1643 128/81 98.1 F (36.7 C) Oral 98 16 97 %  12/18/20 1449 122/90 97.9 F (36.6 C) Oral (!) 101 16 99 %  12/18/20 1400 109/87 -- -- 81 -- 96 %  12/18/20 1300 124/88 -- -- 89 -- 95 %  12/18/20 1200 114/78 -- -- 69 14 94 %  12/18/20 1100 109/78 -- -- 77 15 97 %  12/18/20 1045 106/76 97.6 F (36.4 C) -- 84 11 97 %  12/18/20 1030 101/71 -- -- 84 16 95 %  12/18/20 1015 98/71 -- -- 96 19 96 %  12/18/20 1000 101/70 -- -- 94 16 95 %  12/18/20 0954 98/72 98.4 F (36.9 C) -- 93 14 94 %      Intake/Output from previous day:   08/02 0701 - 08/03 0700 In: 3190.9 [P.O.:720; I.V.:2120.9] Out: 1650 [Urine:1600]   Intake/Output this shift:   No intake/output data recorded.   Intake/Output      08/02 0701 08/03 0700 08/03 0701 08/04 0700   P.O. 720    I.V. (mL/kg) 2120.9 (30.6)    IV Piggyback 350    Total Intake(mL/kg) 3190.9 (46)    Urine (mL/kg/hr) 1600 (1)    Blood 50    Total Output 1650    Net +1540.9            LABORATORY DATA: No results for input(s): WBC, HGB, HCT, PLT in the last 168 hours. Recent Labs    12/19/20 0259  NA 136  K 4.7  CL 104  CO2 26  BUN 9  CREATININE 0.42*   GLUCOSE 176*  CALCIUM 9.1   Lab Results  Component Value Date   INR 1.2 05/03/2020   INR 1.4 (H) 05/01/2020   INR 0.9 04/30/2020    Recent Radiographic Studies :  DG Chest 2 View  Result Date: 11/20/2020 CLINICAL DATA:  Preop for knee surgery. History of CABG and smoking. EXAM: CHEST - 2 VIEW COMPARISON:  Chest radiographs 05/23/2020 and CT 08/17/2020 FINDINGS: Sequelae of CABG are again identified. The cardiomediastinal silhouette is unchanged with normal heart size. The lungs are well inflated. No airspace consolidation, edema, pleural effusion, pneumothorax is identified. No acute osseous abnormality is seen. IMPRESSION: No active cardiopulmonary disease. Electronically Signed   By: Logan Bores M.D.   On: 11/20/2020 14:22  Examination:  General appearance: alert, cooperative, and no distress  Wound Exam: clean, dry, intact   Drainage:  None: wound tissue dry  Motor Exam: EHL, FHL, Anterior Tibial, and Posterior Tibial Impaired  Sensory Exam: some tingling in left toes but no difference from pre op  Vascular Exam: feet warm with good capillary refill  Assessment:    1 Day Post-Op  Procedure(s) (LRB): LEFT TOTAL KNEE ARTHROPLASTY (Left)  ADDITIONAL DIAGNOSIS:  Active Problems:   Total knee replacement status, left     Plan: Physical Therapy as ordered Weight Bearing as Tolerated (WBAT)  DVT Prophylaxis:  Aspirin and TED hose  DISCHARGE PLAN: Home  DISCHARGE NEEDS: HHPT, CPM, Walker, and 3-in-1 comode seat   Patient's anticipated LOS is less than 2 midnights, meeting these requirements: - Younger than 9 - Lives within 1 hour of care - Has a competent adult at home to recover with post-op recover - NO history of  - Chronic pain requiring opiods  - Diabetes  - Coronary Artery Disease  - Heart failure  - Heart attack  - Stroke  - DVT/VTE  - Cardiac arrhythmia  - Respiratory Failure/COPD  - Renal failure  - Anemia  - Advanced Liver disease Awake,  alert and comfortable. Has voided without difficulty. Dressing left knee changed to aquacell-no drainage.No calf pain, SOB or chest pain. Will discharge today        Nonie Hoyer [address]  12/19/2020 7:57 AM

## 2020-12-19 NOTE — Telephone Encounter (Signed)
Aaron Edelman called pt-prescriptions sent in

## 2020-12-19 NOTE — Progress Notes (Signed)
Patient discharged to home w/ family. Given all belongings, instructions, equipment. Verbalized understanding of instructions. Escorted to pov via w/c. 

## 2020-12-19 NOTE — Discharge Summary (Signed)
Joni Fears, MD   Biagio Borg, PA-C [address], [place], [place]  [address]                             [id number]  PATIENT ID: [id number] FRESCH        MRN:  [id number]          DOB/AGE: 61/23/1962 / 61 y.o.    DISCHARGE SUMMARY  ADMISSION DATE:    12/18/2020 DISCHARGE DATE:   12/19/2020   ADMISSION DIAGNOSIS: Total knee replacement status, left [Z96.652]    DISCHARGE DIAGNOSIS:  left knee osteoarthritis  ADDITIONAL DIAGNOSIS: Active Problems:   Total knee replacement status, left  Past Medical History:  Diagnosis Date   Allergy    Arthritis    Coronary artery disease    GERD (gastroesophageal reflux disease)    occ   Heart murmur    baby   High cholesterol    Hypertension    Pneumonia    Right carotid bruit 06/10/2017   Sciatica    Seasonal allergies    Sinus tachycardia    Spinal stenosis     PROCEDURE: Procedure(s): LEFT TOTAL KNEE ARTHROPLASTY Left on 12/18/2020  CONSULTS: none    HISTORY: [name], 60 y.o. male, has a history of pain and functional disability in the left knee due to arthritis and has failed non-surgical conservative treatments for greater than 12 weeks to includeNSAID's and/or analgesics, corticosteriod injections, viscosupplementation injections, flexibility and strengthening excercises, weight reduction as appropriate, and activity modification.  Onset of symptoms was gradual, starting 3 years ago with gradually worsening course since that time. The patient noted prior procedures on the knee to include  arthroscopy and menisectomy on the left knee(s).  Patient currently rates pain in the left knee(s) at 4 to 6 out of 10 with activity. Patient has night pain, worsening of pain with activity and weight bearing, pain that interferes with activities of daily living, pain with passive range of motion, crepitus, and joint swelling.  Patient has evidence of subchondral cysts, subchondral sclerosis, periarticular osteophytes, joint subluxation, and  joint space narrowing by imaging studies.  There is no active infection.  HOSPITAL COURSE:  TEVAUGHN SANTIZO is a 60 y.o. admitted on 12/18/2020 and found to have a diagnosis of left knee OA.   After appropriate laboratory studies were obtained  they were taken to the operating room on 12/18/2020 and underwent    Procedure(s): LEFT TOTAL KNEE ARTHROPLASTY    They were given perioperative antibiotics:  Anti-infectives (From admission, onward)    Start     Dose/Rate Route Frequency Ordered Stop   12/18/20 1900  vancomycin (VANCOCIN) IVPB 1000 mg/200 mL premix        1,000 mg 200 mL/hr over 60 Minutes Intravenous Every 12 hours 12/18/20 1452 12/18/20 2014   12/18/20 0600  vancomycin (VANCOCIN) IVPB 1000 mg/200 mL premix        1,000 mg 200 mL/hr over 60 Minutes Intravenous On call to O.R. 12/18/20 0518 12/18/20 0749     .  Tolerated the procedure well.   Allowed out of bed to a chair.  PT for ambulation and exercise program.   POD #1, continued PT and ambulation.    The remainder of the hospital course was dedicated to ambulation and strengthening.   The patient was discharged on 1 Day Post-Op in  Stable condition.  Blood products given:none  DIAGNOSTIC STUDIES: Recent vital signs:  Patient Vitals for the past 24 hrs:  BP Temp Temp src Pulse Resp SpO2  12/19/20 0942 132/89 97.7 F (36.5 C) -- 100 18 97 %  12/19/20 0544 131/90 97.7 F (36.5 C) Oral (!) 110 18 96 %  12/19/20 0049 117/76 97.6 F (36.4 C) Oral 92 16 95 %  12/18/20 2109 129/82 (!) 97.5 F (36.4 C) Oral 86 20 97 %  12/18/20 1643 128/81 98.1 F (36.7 C) Oral 98 16 97 %  12/18/20 1449 122/90 97.9 F (36.6 C) Oral (!) 101 16 99 %  12/18/20 1400 109/87 -- -- 81 -- 96 %  12/18/20 1300 124/88 -- -- 89 -- 95 %  12/18/20 1200 114/78 -- -- 69 14 94 %       Recent laboratory studies: No results for input(s): WBC, HGB, HCT, PLT in the last 168 hours. Recent Labs    12/19/20 0259  NA 136  K 4.7  CL 104  CO2 26  BUN 9   CREATININE 0.42*  GLUCOSE 176*  CALCIUM 9.1   Lab Results  Component Value Date   INR 1.2 05/03/2020   INR 1.4 (H) 05/01/2020   INR 0.9 04/30/2020     Recent Radiographic Studies :  DG Chest 2 View  Result Date: 11/20/2020 CLINICAL DATA:  Preop for knee surgery. History of CABG and smoking. EXAM: CHEST - 2 VIEW COMPARISON:  Chest radiographs 05/23/2020 and CT 08/17/2020 FINDINGS: Sequelae of CABG are again identified. The cardiomediastinal silhouette is unchanged with normal heart size. The lungs are well inflated. No airspace consolidation, edema, pleural effusion, pneumothorax is identified. No acute osseous abnormality is seen. IMPRESSION: No active cardiopulmonary disease. Electronically Signed   By: Logan Bores M.D.   On: 11/20/2020 14:22    DISCHARGE INSTRUCTIONS:  INSTRUCTIONS AFTER JOINT REPLACEMENT   Remove items at home which could result in a fall. This includes throw rugs or furniture in walking pathways ICE to the affected joint every three hours while awake for 30 minutes at a time, for at least the first 3-5 days, and then as needed for pain and swelling.  Continue to use ice for pain and swelling. You may notice swelling that will progress down to the foot and ankle.  This is normal after surgery.  Elevate your leg when you are not up walking on it.   Continue to use the breathing machine you got in the hospital (incentive spirometer) which will help keep your temperature down.  It is common for your temperature to cycle up and down following surgery, especially at night when you are not up moving around and exerting yourself.  The breathing machine keeps your lungs expanded and your temperature down.   DIET:  As you were doing prior to hospitalization, we recommend a well-balanced diet.  DRESSING / WOUND CARE / SHOWERING  Keep the surgical dressing until follow up.  The dressing is water proof, so you can shower without any extra covering.  IF THE DRESSING FALLS OFF or  the wound gets wet inside, change the dressing with sterile gauze.  Please use good hand washing techniques before changing the dressing.  Do not use any lotions or creams on the incision until instructed by your surgeon.    ACTIVITY  Increase activity slowly as tolerated, but follow the weight bearing instructions below.   No driving for 6 weeks or until further direction given by your physician.  You cannot drive while taking narcotics.  No lifting or carrying greater  than 10 lbs. until further directed by your surgeon. Avoid periods of inactivity such as sitting longer than an hour when not asleep. This helps prevent blood clots.  You may return to work once you are authorized by your doctor.     WEIGHT BEARING   Weight bearing as tolerated with assist device (walker, cane, etc) as directed, use it as long as suggested by your surgeon or therapist, typically at least 4-6 weeks.   EXERCISES  Results after joint replacement surgery are often greatly improved when you follow the exercise, range of motion and muscle strengthening exercises prescribed by your doctor. Safety measures are also important to protect the joint from further injury. Any time any of these exercises cause you to have increased pain or swelling, decrease what you are doing until you are comfortable again and then slowly increase them. If you have problems or questions, call your caregiver or physical therapist for advice.   Rehabilitation is important following a joint replacement. After just a few days of immobilization, the muscles of the leg can become weakened and shrink (atrophy).  These exercises are designed to build up the tone and strength of the thigh and leg muscles and to improve motion. Often times heat used for twenty to thirty minutes before working out will loosen up your tissues and help with improving the range of motion but do not use heat for the first two weeks following surgery (sometimes heat can  increase post-operative swelling).   These exercises can be done on a training (exercise) mat, on the floor, on a table or on a bed. Use whatever works the best and is most comfortable for you.    Use music or television while you are exercising so that the exercises are a pleasant break in your day. This will make your life better with the exercises acting as a break in your routine that you can look forward to.   Perform all exercises about fifteen times, three times per day or as directed.  You should exercise both the operative leg and the other leg as well.  Exercises include:   Quad Sets - Tighten up the muscle on the front of the thigh (Quad) and hold for 5-10 seconds.   Straight Leg Raises - With your knee straight (if you were given a brace, keep it on), lift the leg to 60 degrees, hold for 3 seconds, and slowly lower the leg.  Perform this exercise against resistance later as your leg gets stronger.  Leg Slides: Lying on your back, slowly slide your foot toward your buttocks, bending your knee up off the floor (only go as far as is comfortable). Then slowly slide your foot back down until your leg is flat on the floor again.  Angel Wings: Lying on your back spread your legs to the side as far apart as you can without causing discomfort.  Hamstring Strength:  Lying on your back, push your heel against the floor with your leg straight by tightening up the muscles of your buttocks.  Repeat, but this time bend your knee to a comfortable angle, and push your heel against the floor.  You may put a pillow under the heel to make it more comfortable if necessary.   A rehabilitation program following joint replacement surgery can speed recovery and prevent re-injury in the future due to weakened muscles. Contact your doctor or a physical therapist for more information on knee rehabilitation.    CONSTIPATION  Constipation is defined  medically as fewer than three stools per week and severe  constipation as less than one stool per week.  Even if you have a regular bowel pattern at home, your normal regimen is likely to be disrupted due to multiple reasons following surgery.  Combination of anesthesia, postoperative narcotics, change in appetite and fluid intake all can affect your bowels.   YOU MUST use at least one of the following options; they are listed in order of increasing strength to get the job done.  They are all available over the counter, and you may need to use some, POSSIBLY even all of these options:    Drink plenty of fluids (prune juice may be helpful) and high fiber foods Colace 100 mg by mouth twice a day  Senokot for constipation as directed and as needed Dulcolax (bisacodyl), take with full glass of water  Miralax (polyethylene glycol) once or twice a day as needed.  If you have tried all these things and are unable to have a bowel movement in the first 3-4 days after surgery call either your surgeon or your primary doctor.    If you experience loose stools or diarrhea, hold the medications until you stool forms back up.  If your symptoms do not get better within 1 week or if they get worse, check with your doctor.  If you experience "the worst abdominal pain ever" or develop nausea or vomiting, please contact the office immediately for further recommendations for treatment.   ITCHING:  If you experience itching with your medications, try taking only a single pain pill, or even half a pain pill at a time.  You can also use Benadryl over the counter for itching or also to help with sleep.   TED HOSE STOCKINGS:  Use stockings on both legs until for at least 2 weeks or as directed by physician office. They may be removed at night for sleeping.  MEDICATIONS:  See your medication summary on the "After Visit Summary" that nursing will review with you.  You may have some home medications which will be placed on hold until you complete the course of blood thinner  medication.  It is important for you to complete the blood thinner medication as prescribed.  PRECAUTIONS:  If you experience chest pain or shortness of breath - call 911 immediately for transfer to the hospital emergency department.   If you develop a fever greater that 101 F, purulent drainage from wound, increased redness or drainage from wound, foul odor from the wound/dressing, or calf pain - CONTACT YOUR SURGEON.                                                   FOLLOW-UP APPOINTMENTS:  If you do not already have a post-op appointment, please call the office for an appointment to be seen by your surgeon.  Guidelines for how soon to be seen are listed in your "After Visit Summary", but are typically between 1-4 weeks after surgery.  OTHER INSTRUCTIONS:   Knee Replacement:  Do not place pillow under knee, focus on keeping the knee straight while resting. CPM instructions: 0-90 degrees, 2 hours in the morning, 2 hours in the afternoon, and 2 hours in the evening. Place foam block, curve side up under heel at all times except when in CPM or when walking.  DO NOT  modify, tear, cut, or change the foam block in any way.  POST-OPERATIVE OPIOID TAPER INSTRUCTIONS: It is important to wean off of your opioid medication as soon as possible. If you do not need pain medication after your surgery it is ok to stop day one. Opioids include: Codeine, Hydrocodone(Norco, Vicodin), Oxycodone(Percocet, oxycontin) and hydromorphone amongst others.  Long term and even short term use of opiods can cause: Increased pain response Dependence Constipation Depression Respiratory depression And more.  Withdrawal symptoms can include Flu like symptoms Nausea, vomiting And more Techniques to manage these symptoms Hydrate well Eat regular healthy meals Stay active Use relaxation techniques(deep breathing, meditating, yoga) Do Not substitute Alcohol to help with tapering If you have been on opioids for less than  two weeks and do not have pain than it is ok to stop all together.  Plan to wean off of opioids This plan should start within one week post op of your joint replacement. Maintain the same interval or time between taking each dose and first decrease the dose.  Cut the total daily intake of opioids by one tablet each day Next start to increase the time between doses. The last dose that should be eliminated is the evening dose.   MAKE SURE YOU:  Understand these instructions.  Get help right away if you are not doing well or get worse.    Thank you for letting us be a part of your medical care team.  It is a privilege we respect greatly.  We hope these instructions will help you stay on track for a fast and full recovery!       Discharge Instructions     CPM   Complete by: As directed    Continuous passive motion machine (CPM):      Use the CPM from 0 to 60 degrees for 6-8 hours per day.      You may increase by 5-10 per day.  You may break it up into 2 or 3 sessions per day.      Use CPM for 3-4 weeks or until you are told to stop.   CPM   Complete by: As directed    Continuous passive motion machine (CPM):      Use the CPM from 0 to 60 degrees for 6-8 hours per day.      You may increase by 5-10 per day.  You may break it up into 2 or 3 sessions per day.      Use CPM for 3-4 weeks or until you are told to stop.   Call MD / Call 911   Complete by: As directed    If you experience chest pain or shortness of breath, CALL 911 and be transported to the hospital emergency room.  If you develope a fever above 101 F, pus (white drainage) or increased drainage or redness at the wound, or calf pain, call your surgeon's office.   Call MD / Call 911   Complete by: As directed    If you experience chest pain or shortness of breath, CALL 911 and be transported to the hospital emergency room.  If you develope a fever above 101 F, pus (white drainage) or increased drainage or redness at the  wound, or calf pain, call your surgeon's office.   Change dressing   Complete by: As directed    DO NOT CHANGE YOUR DRESSING.   Change dressing   Complete by: As directed    DO NOT CHANGE YOUR DRESSING.  Constipation Prevention   Complete by: As directed    Drink plenty of fluids.  Prune juice may be helpful.  You may use a stool softener, such as Colace (over the counter) 100 mg twice a day.  Use MiraLax (over the counter) for constipation as needed.   Constipation Prevention   Complete by: As directed    Drink plenty of fluids.  Prune juice may be helpful.  You may use a stool softener, such as Colace (over the counter) 100 mg twice a day.  Use MiraLax (over the counter) for constipation as needed.   Diet general   Complete by: As directed    Discharge instructions   Complete by: As directed    [place] items at home which could result in a fall. This includes throw rugs or furniture in walking pathways ICE to the affected joint every three hours while awake for 30 minutes at a time, for at least the first 3-5 days, and then as needed for pain and swelling.  Continue to use ice for pain and swelling. You may notice swelling that will progress down to the foot and ankle.  This is normal after surgery.  Elevate your leg when you are not up walking on it.   Continue to use the breathing machine you got in the hospital (incentive spirometer) which will help keep your temperature down.  It is common for your temperature to cycle up and down following surgery, especially at night when you are not up moving around and exerting yourself.  The breathing machine keeps your lungs expanded and your temperature down.   DIET:  As you were doing prior to hospitalization, we recommend a well-balanced diet.  DRESSING / WOUND CARE / SHOWERING  Keep the surgical dressing until follow up.  The dressing is water proof, so you can shower without any extra covering.  IF  THE DRESSING FALLS OFF or the wound gets wet inside, change the dressing with sterile gauze.  Please use good hand washing techniques before changing the dressing.  Do not use any lotions or creams on the incision until instructed by your surgeon.    ACTIVITY  Increase activity slowly as tolerated, but follow the weight bearing instructions below.   No driving for 6 weeks or until further direction given by your physician.  You cannot drive while taking narcotics.  No lifting or carrying greater than 10 lbs. until further directed by your surgeon. Avoid periods of inactivity such as sitting longer than an hour when not asleep. This helps prevent blood clots.  You may return to work once you are authorized by your doctor.     WEIGHT BEARING   Weight bearing as tolerated with assist device (walker, cane, etc) as directed, use it as long as suggested by your surgeon or therapist, typically at least 4-6 weeks.   EXERCISES  Results after joint replacement surgery are often greatly improved when you follow the exercise, range of motion and muscle strengthening exercises prescribed by your doctor. Safety measures are also important to protect the joint from further injury. Any time any of these exercises cause you to have increased pain or swelling, decrease what you are doing until you are comfortable again and then slowly increase them. If you have problems or questions, call your caregiver or physical therapist for advice.   Rehabilitation is important following a joint replacement. After just a few days of immobilization, the muscles of the leg can  become weakened and shrink (atrophy).  These exercises are designed to build up the tone and strength of the thigh and leg muscles and to improve motion. Often times heat used for twenty to thirty minutes before working out will loosen up your tissues and help with improving the range of motion but do not use heat for the first two weeks following  surgery (sometimes heat can increase post-operative swelling).   These exercises can be done on a training (exercise) mat, on the floor, on a table or on a bed. Use whatever works the best and is most comfortable for you.    Use music or television while you are exercising so that the exercises are a pleasant break in your day. This will make your life better with the exercises acting as a break in your routine that you can look forward to.   Perform all exercises about fifteen times, three times per day or as directed.  You should exercise both the operative leg and the other leg as well.  Exercises include:   Quad Sets - Tighten up the muscle on the front of the thigh (Quad) and hold for 5-10 seconds.   Straight Leg Raises - With your knee straight (if you were given a brace, keep it on), lift the leg to 60 degrees, hold for 3 seconds, and slowly lower the leg.  Perform this exercise against resistance later as your leg gets stronger.  Leg Slides: Lying on your back, slowly slide your foot toward your buttocks, bending your knee up off the floor (only go as far as is comfortable). Then slowly slide your foot back down until your leg is flat on the floor again.  Angel Wings: Lying on your back spread your legs to the side as far apart as you can without causing discomfort.  Hamstring Strength:  Lying on your back, push your heel against the floor with your leg straight by tightening up the muscles of your buttocks.  Repeat, but this time bend your knee to a comfortable angle, and push your heel against the floor.  You may put a pillow under the heel to make it more comfortable if necessary.   A rehabilitation program following joint replacement surgery can speed recovery and prevent re-injury in the future due to weakened muscles. Contact your doctor or a physical therapist for more information on knee rehabilitation.    CONSTIPATION  Constipation is defined medically as fewer than three stools per  week and severe constipation as less than one stool per week.  Even if you have a regular bowel pattern at home, your normal regimen is likely to be disrupted due to multiple reasons following surgery.  Combination of anesthesia, postoperative narcotics, change in appetite and fluid intake all can affect your bowels.   YOU MUST use at least one of the following options; they are listed in order of increasing strength to get the job done.  They are all available over the counter, and you may need to use some, POSSIBLY even all of these options:    Drink plenty of fluids (prune juice may be helpful) and high fiber foods Colace 100 mg by mouth twice a day  Senokot for constipation as directed and as needed Dulcolax (bisacodyl), take with full glass of water  Miralax (polyethylene glycol) once or twice a day as needed.  If you have tried all these things and are unable to have a bowel movement in the first 3-4 days after surgery call  either your surgeon or your primary doctor.    If you experience loose stools or diarrhea, hold the medications until you stool forms back up.  If your symptoms do not get better within 1 week or if they get worse, check with your doctor.  If you experience "the worst abdominal pain ever" or develop nausea or vomiting, please contact the office immediately for further recommendations for treatment.   ITCHING:  If you experience itching with your medications, try taking only a single pain pill, or even half a pain pill at a time.  You can also use Benadryl over the counter for itching or also to help with sleep.   TED HOSE STOCKINGS:  Use stockings on both legs until for at least 2 weeks or as directed by physician office. They may be removed at night for sleeping.  MEDICATIONS:  See your medication summary on the "After Visit Summary" that nursing will review with you.  You may have some home medications which will be placed on hold until you complete the course of blood  thinner medication.  It is important for you to complete the blood thinner medication as prescribed.  PRECAUTIONS:  If you experience chest pain or shortness of breath - call 911 immediately for transfer to the hospital emergency department.   If you develop a fever greater that 101 F, purulent drainage from wound, increased redness or drainage from wound, foul odor from the wound/dressing, or calf pain - CONTACT YOUR SURGEON.                                                   FOLLOW-UP APPOINTMENTS:  If you do not already have a post-op appointment, please call the office for an appointment to be seen by your surgeon.  Guidelines for how soon to be seen are listed in your "After Visit Summary", but are typically between 1-4 weeks after surgery.  OTHER INSTRUCTIONS:   Knee Replacement:  Do not place pillow under knee, focus on keeping the knee straight while resting. CPM instructions: 0-90 degrees, 2 hours in the morning, 2 hours in the afternoon, and 2 hours in the evening. Place foam block, curve side up under heel at all times except when in CPM or when walking.  DO NOT modify, tear, cut, or change the foam block in any way.  POST-OPERATIVE OPIOID TAPER INSTRUCTIONS: It is important to wean off of your opioid medication as soon as possible. If you do not need pain medication after your surgery it is ok to stop day one. Opioids include: Codeine, Hydrocodone(Norco, Vicodin), Oxycodone(Percocet, oxycontin) and hydromorphone amongst others.  Long term and even short term use of opiods can cause: Increased pain response Dependence Constipation Depression Respiratory depression And more.  Withdrawal symptoms can include Flu like symptoms Nausea, vomiting And more Techniques to manage these symptoms Hydrate well Eat regular healthy meals Stay active Use relaxation techniques(deep breathing, meditating, yoga) Do Not substitute Alcohol to help with tapering If you have been on opioids for  less than two weeks and do not have pain than it is ok to stop all together.  Plan to wean off of opioids This plan should start within one week post op of your joint replacement. Maintain the same interval or time between taking each dose and first decrease the dose.  Cut the total  daily intake of opioids by one tablet each day Next start to increase the time between doses. The last dose that should be eliminated is the evening dose.     MAKE SURE YOU:  Understand these instructions.  Get help right away if you are not doing well or get worse.    Thank you for letting us be a part of your medical care team.  It is a privilege we respect greatly.  We hope these instructions will help you stay on track for a fast and full recovery!   Discharge instructions   Complete by: As directed    INSTRUCTIONS AFTER JOINT REPLACEMENT   Remove items at home which could result in a fall. This includes throw rugs or furniture in walking pathways ICE to the affected joint every three hours while awake for 30 minutes at a time, for at least the first 3-5 days, and then as needed for pain and swelling.  Continue to use ice for pain and swelling. You may notice swelling that will progress down to the foot and ankle.  This is normal after surgery.  Elevate your leg when you are not up walking on it.   Continue to use the breathing machine you got in the hospital (incentive spirometer) which will help keep your temperature down.  It is common for your temperature to cycle up and down following surgery, especially at night when you are not up moving around and exerting yourself.  The breathing machine keeps your lungs expanded and your temperature down.   DIET:  As you were doing prior to hospitalization, we recommend a well-balanced diet.  DRESSING / WOUND CARE / SHOWERING  Keep the surgical dressing until follow up.  The dressing is water proof, so you can shower without any extra covering.  IF THE DRESSING  FALLS OFF or the wound gets wet inside, change the dressing with sterile gauze.  Please use good hand washing techniques before changing the dressing.  Do not use any lotions or creams on the incision until instructed by your surgeon.    ACTIVITY  Increase activity slowly as tolerated, but follow the weight bearing instructions below.   No driving for 6 weeks or until further direction given by your physician.  You cannot drive while taking narcotics.  No lifting or carrying greater than 10 lbs. until further directed by your surgeon. Avoid periods of inactivity such as sitting longer than an hour when not asleep. This helps prevent blood clots.  You may return to work once you are authorized by your doctor.     WEIGHT BEARING   Weight bearing as tolerated with assist device (walker, cane, etc) as directed, use it as long as suggested by your surgeon or therapist, typically at least 4-6 weeks.   EXERCISES  Results after joint replacement surgery are often greatly improved when you follow the exercise, range of motion and muscle strengthening exercises prescribed by your doctor. Safety measures are also important to protect the joint from further injury. Any time any of these exercises cause you to have increased pain or swelling, decrease what you are doing until you are comfortable again and then slowly increase them. If you have problems or questions, call your caregiver or physical therapist for advice.   Rehabilitation is important following a joint replacement. After just a few days of immobilization, the muscles of the leg can become weakened and shrink (atrophy).  These exercises are designed to build up the tone and strength  of the thigh and leg muscles and to improve motion. Often times heat used for twenty to thirty minutes before working out will loosen up your tissues and help with improving the range of motion but do not use heat for the first two weeks following surgery (sometimes  heat can increase post-operative swelling).   These exercises can be done on a training (exercise) mat, on the floor, on a table or on a bed. Use whatever works the best and is most comfortable for you.    Use music or television while you are exercising so that the exercises are a pleasant break in your day. This will make your life better with the exercises acting as a break in your routine that you can look forward to.   Perform all exercises about fifteen times, three times per day or as directed.  You should exercise both the operative leg and the other leg as well.  Exercises include:   Quad Sets - Tighten up the muscle on the front of the thigh (Quad) and hold for 5-10 seconds.   Straight Leg Raises - With your knee straight (if you were given a brace, keep it on), lift the leg to 60 degrees, hold for 3 seconds, and slowly lower the leg.  Perform this exercise against resistance later as your leg gets stronger.  Leg Slides: Lying on your back, slowly slide your foot toward your buttocks, bending your knee up off the floor (only go as far as is comfortable). Then slowly slide your foot back down until your leg is flat on the floor again.  Angel Wings: Lying on your back spread your legs to the side as far apart as you can without causing discomfort.  Hamstring Strength:  Lying on your back, push your heel against the floor with your leg straight by tightening up the muscles of your buttocks.  Repeat, but this time bend your knee to a comfortable angle, and push your heel against the floor.  You may put a pillow under the heel to make it more comfortable if necessary.   A rehabilitation program following joint replacement surgery can speed recovery and prevent re-injury in the future due to weakened muscles. Contact your doctor or a physical therapist for more information on knee rehabilitation.    CONSTIPATION  Constipation is defined medically as fewer than three stools per week and severe  constipation as less than one stool per week.  Even if you have a regular bowel pattern at home, your normal regimen is likely to be disrupted due to multiple reasons following surgery.  Combination of anesthesia, postoperative narcotics, change in appetite and fluid intake all can affect your bowels.   YOU MUST use at least one of the following options; they are listed in order of increasing strength to get the job done.  They are all available over the counter, and you may need to use some, POSSIBLY even all of these options:    Drink plenty of fluids (prune juice may be helpful) and high fiber foods Colace 100 mg by mouth twice a day  Senokot for constipation as directed and as needed Dulcolax (bisacodyl), take with full glass of water  Miralax (polyethylene glycol) once or twice a day as needed.  If you have tried all these things and are unable to have a bowel movement in the first 3-4 days after surgery call either your surgeon or your primary doctor.    If you experience loose stools or diarrhea,  hold the medications until you stool forms back up.  If your symptoms do not get better within 1 week or if they get worse, check with your doctor.  If you experience "the worst abdominal pain ever" or develop nausea or vomiting, please contact the office immediately for further recommendations for treatment.   ITCHING:  If you experience itching with your medications, try taking only a single pain pill, or even half a pain pill at a time.  You can also use Benadryl over the counter for itching or also to help with sleep.   TED HOSE STOCKINGS:  Use stockings on both legs until for at least 2 weeks or as directed by physician office. They may be removed at night for sleeping.  MEDICATIONS:  See your medication summary on the "After Visit Summary" that nursing will review with you.  You may have some home medications which will be placed on hold until you complete the course of blood thinner  medication.  It is important for you to complete the blood thinner medication as prescribed.  PRECAUTIONS:  If you experience chest pain or shortness of breath - call 911 immediately for transfer to the hospital emergency department.   If you develop a fever greater that 101 F, purulent drainage from wound, increased redness or drainage from wound, foul odor from the wound/dressing, or calf pain - CONTACT YOUR SURGEON.                                                   FOLLOW-UP APPOINTMENTS:  If you do not already have a post-op appointment, please call the office for an appointment to be seen by your surgeon.  Guidelines for how soon to be seen are listed in your "After Visit Summary", but are typically between 1-4 weeks after surgery.  OTHER INSTRUCTIONS:   Knee Replacement:  Do not place pillow under knee, focus on keeping the knee straight while resting. CPM instructions: 0-90 degrees, 2 hours in the morning, 2 hours in the afternoon, and 2 hours in the evening. Place foam block, curve side up under heel at all times except when in CPM or when walking.  DO NOT modify, tear, cut, or change the foam block in any way.  POST-OPERATIVE OPIOID TAPER INSTRUCTIONS: It is important to wean off of your opioid medication as soon as possible. If you do not need pain medication after your surgery it is ok to stop day one. Opioids include: Codeine, Hydrocodone(Norco, Vicodin), Oxycodone(Percocet, oxycontin) and hydromorphone amongst others.  Long term and even short term use of opiods can cause: Increased pain response Dependence Constipation Depression Respiratory depression And more.  Withdrawal symptoms can include Flu like symptoms Nausea, vomiting And more Techniques to manage these symptoms Hydrate well Eat regular healthy meals Stay active Use relaxation techniques(deep breathing, meditating, yoga) Do Not substitute Alcohol to help with tapering If you have been on opioids for less than  two weeks and do not have pain than it is ok to stop all together.  Plan to wean off of opioids This plan should start within one week post op of your joint replacement. Maintain the same interval or time between taking each dose and first decrease the dose.  Cut the total daily intake of opioids by one tablet each day Next start to increase the time between doses.  The last dose that should be eliminated is the evening dose.     MAKE SURE YOU:  Understand these instructions.  Get help right away if you are not doing well or get worse.    Thank you for letting us be a part of your medical care team.  It is a privilege we respect greatly.  We hope these instructions will help you stay on track for a fast and full recovery!   Do not put a pillow under the knee. Place it under the heel.   Complete by: As directed    Do not put a pillow under the knee. Place it under the heel.   Complete by: As directed    Driving restrictions   Complete by: As directed    No driving for 6 weeks   Driving restrictions   Complete by: As directed    No driving for 6 weeks   Increase activity slowly as tolerated   Complete by: As directed    Increase activity slowly as tolerated   Complete by: As directed    Lifting restrictions   Complete by: As directed    No lifting for 6 weeks   Lifting restrictions   Complete by: As directed    No lifting for 6 weeks   Partial weight bearing   Complete by: As directed    % Body Weight: Full   Laterality: left   Extremity: Lower   Patient may shower   Complete by: As directed    You may shower over your dressing   Patient may shower   Complete by: As directed    You may shower over your dressing   Post-operative opioid taper instructions:   Complete by: As directed    POST-OPERATIVE OPIOID TAPER INSTRUCTIONS: It is important to wean off of your opioid medication as soon as possible. If you do not need pain medication after your surgery it is ok to stop  day one. Opioids include: Codeine, Hydrocodone(Norco, Vicodin), Oxycodone(Percocet, oxycontin) and hydromorphone amongst others.  Long term and even short term use of opiods can cause: Increased pain response Dependence Constipation Depression Respiratory depression And more.  Withdrawal symptoms can include Flu like symptoms Nausea, vomiting And more Techniques to manage these symptoms Hydrate well Eat regular healthy meals Stay active Use relaxation techniques(deep breathing, meditating, yoga) Do Not substitute Alcohol to help with tapering If you have been on opioids for less than two weeks and do not have pain than it is ok to stop all together.  Plan to wean off of opioids This plan should start within one week post op of your joint replacement. Maintain the same interval or time between taking each dose and first decrease the dose.  Cut the total daily intake of opioids by one tablet each day Next start to increase the time between doses. The last dose that should be eliminated is the evening dose.      Post-operative opioid taper instructions:   Complete by: As directed    POST-OPERATIVE OPIOID TAPER INSTRUCTIONS: It is important to wean off of your opioid medication as soon as possible. If you do not need pain medication after your surgery it is ok to stop day one. Opioids include: Codeine, Hydrocodone(Norco, Vicodin), Oxycodone(Percocet, oxycontin) and hydromorphone amongst others.  Long term and even short term use of opiods can cause: Increased pain response Dependence Constipation Depression Respiratory depression And more.  Withdrawal symptoms can include Flu like symptoms Nausea, vomiting And more Techniques  to manage these symptoms Hydrate well Eat regular healthy meals Stay active Use relaxation techniques(deep breathing, meditating, yoga) Do Not substitute Alcohol to help with tapering If you have been on opioids for less than two weeks and do not  have pain than it is ok to stop all together.  Plan to wean off of opioids This plan should start within one week post op of your joint replacement. Maintain the same interval or time between taking each dose and first decrease the dose.  Cut the total daily intake of opioids by one tablet each day Next start to increase the time between doses. The last dose that should be eliminated is the evening dose.      TED hose   Complete by: As directed    Use stockings (TED hose) for 3 weeks on left  leg.  You may remove them at night for sleeping.   TED hose   Complete by: As directed    Use stockings (TED hose) for 3 weeks on left  leg.  You may remove them at night for sleeping.   Weight bearing as tolerated   Complete by: As directed        DISCHARGE MEDICATIONS:   Allergies as of 12/19/2020       Reactions   Mushroom Extract Complex Anaphylaxis   Penicillins Shortness Of Breath   Childhood allergy.  Has patient had a PCN reaction causing immediate rash, facial/tongue/throat swelling, SOB or lightheadedness with hypotension: No Has patient had a PCN reaction causing severe rash involving mucus membranes or skin necrosis: No Has patient had a PCN reaction that required hospitalization No Has patient had a PCN reaction occurring within the last 10 years: No If all of the above answers are "NO", then may proceed with C   Atorvastatin Other (See Comments)   myalgia   Chantix [varenicline]    Other reaction(s): headaches   Dilaudid [hydromorphone] Nausea And Vomiting   Losartan Potassium Other (See Comments)   Lethargic    Pravastatin Other (See Comments)   myalgia   Rosuvastatin Other (See Comments)   myalgia   Bystolic [nebivolol Hcl] Other (See Comments)   Extreme fatigue, not able to focus   Codeine Nausea And Vomiting   Iodine Rash   Iohexol Rash   Metoprolol Other (See Comments)   Made patient very fatigued, could not focus   Repatha [evolocumab] Rash, Other (See  Comments)   Hip pain        Medication List     STOP taking these medications    diclofenac Sodium 1 % Gel Commonly known as: VOLTAREN   Repatha SureClick [id number] MG/ML Soaj Generic drug: Evolocumab       TAKE these medications    acetaminophen 325 MG tablet Commonly known as: TYLENOL Take 2 tablets (650 mg total) by mouth every 6 (six) hours as needed for mild pain or moderate pain (pain score 1-3 or temp > 100.5).   aspirin EC 81 MG tablet Take 1 tablet (81 mg total) by mouth 2 (two) times daily after a meal. Swallow whole. What changed: when to take this   diphenhydrAMINE 25 MG tablet Commonly known as: BENADRYL Take 12.5 mg by mouth at bedtime.   fluticasone 50 MCG/ACT nasal spray Commonly known as: FLONASE Place 2 sprays into both nostrils daily as needed for allergies.   Lubricant Eye Drops 0.4-0.3 % Soln Generic drug: Polyethyl Glycol-Propyl Glycol Place 1 drop into both eyes 3 (three) times daily as needed (  dry/irritated eyes).   methocarbamol 500 MG tablet Commonly known as: ROBAXIN Take 1 tablet (500 mg total) by mouth every 8 (eight) hours as needed for muscle spasms.   multivitamin with minerals Tabs tablet Take 1 tablet by mouth in the morning.   ondansetron 4 MG disintegrating tablet Commonly known as: Zofran ODT Take 1 tablet (4 mg total) by mouth every 8 (eight) hours as needed for nausea or vomiting.   oxyCODONE 5 MG immediate release tablet Commonly known as: Oxy IR/ROXICODONE Take 1 tablet (5 mg total) by mouth every 4 (four) hours as needed for moderate pain (pain score 4-6).       ASK your doctor about these medications    diltiazem 240 MG 24 hr capsule Commonly known as: CARDIZEM CD Take 1 capsule (240 mg total) by mouth daily.               Durable Medical Equipment  (From admission, onward)           Start     Ordered   12/18/20 1453  DME Walker rolling  Once       Question:  Patient needs a walker to treat with  the following condition  Answer:  Total knee replacement status, left   12/18/20 1452   12/18/20 1453  DME 3 n 1  Once        12/18/20 1452   12/18/20 1453  DME Bedside commode  Once       Question:  Patient needs a bedside commode to treat with the following condition  Answer:  Total knee replacement status, left   12/18/20 1452              Discharge Care Instructions  (From admission, onward)           Start     Ordered   12/19/20 0000  Change dressing       Comments: DO NOT CHANGE YOUR DRESSING.   12/19/20 1127   12/19/20 0000  Weight bearing as tolerated        12/19/20 1127   12/19/20 0000  Partial weight bearing       Question Answer Comment  % Body Weight Full   Laterality left   Extremity Lower      12/19/20 1136   12/19/20 0000  Change dressing       Comments: DO NOT CHANGE YOUR DRESSING.   12/19/20 1136            FOLLOW UP VISIT:    Follow-up Information     [facility] Follow up.   Specialty: Home Health Services Why: to provide home health physical therapy Contact information: [place] [id number] [phone]                 DISPOSITION:   Home  CONDITION:  Stable  INSTRUCTIONS AFTER JOINT REPLACEMENT   Remove items at home which could result in a fall. This includes throw rugs or furniture in walking pathways ICE to the affected joint every three hours while awake for 30 minutes at a time, for at least the first 3-5 days, and then as needed for pain and swelling.  Continue to use ice for pain and swelling. You may notice swelling that will progress down to the foot and ankle.  This is normal after surgery.  Elevate your leg when you are not up walking on it.   Continue to use the breathing machine  you got in the hospital (incentive spirometer) which will help keep your temperature down.  It is common for your temperature to cycle up and down following surgery, especially at night when you are  not up moving around and exerting yourself.  The breathing machine keeps your lungs expanded and your temperature down.   DIET:  As you were doing prior to hospitalization, we recommend a well-balanced diet.  DRESSING / WOUND CARE / SHOWERING  Keep the surgical dressing until follow up.  The dressing is water proof, so you can shower without any extra covering.  IF THE DRESSING FALLS OFF or the wound gets wet inside, change the dressing with sterile gauze.  Please use good hand washing techniques before changing the dressing.  Do not use any lotions or creams on the incision until instructed by your surgeon.    ACTIVITY  Increase activity slowly as tolerated, but follow the weight bearing instructions below.   No driving for 6 weeks or until further direction given by your physician.  You cannot drive while taking narcotics.  No lifting or carrying greater than 10 lbs. until further directed by your surgeon. Avoid periods of inactivity such as sitting longer than an hour when not asleep. This helps prevent blood clots.  You may return to work once you are authorized by your doctor.     WEIGHT BEARING   Weight bearing as tolerated with assist device (walker, cane, etc) as directed, use it as long as suggested by your surgeon or therapist, typically at least 4-6 weeks.   EXERCISES  Results after joint replacement surgery are often greatly improved when you follow the exercise, range of motion and muscle strengthening exercises prescribed by your doctor. Safety measures are also important to protect the joint from further injury. Any time any of these exercises cause you to have increased pain or swelling, decrease what you are doing until you are comfortable again and then slowly increase them. If you have problems or questions, call your caregiver or physical therapist for advice.   Rehabilitation is important following a joint replacement. After just a few days of immobilization, the  muscles of the leg can become weakened and shrink (atrophy).  These exercises are designed to build up the tone and strength of the thigh and leg muscles and to improve motion. Often times heat used for twenty to thirty minutes before working out will loosen up your tissues and help with improving the range of motion but do not use heat for the first two weeks following surgery (sometimes heat can increase post-operative swelling).   These exercises can be done on a training (exercise) mat, on the floor, on a table or on a bed. Use whatever works the best and is most comfortable for you.    Use music or television while you are exercising so that the exercises are a pleasant break in your day. This will make your life better with the exercises acting as a break in your routine that you can look forward to.   Perform all exercises about fifteen times, three times per day or as directed.  You should exercise both the operative leg and the other leg as well.  Exercises include:   Quad Sets - Tighten up the muscle on the front of the thigh (Quad) and hold for 5-10 seconds.   Straight Leg Raises - With your knee straight (if you were given a brace, keep it on), lift the leg to 60 degrees, hold for  3 seconds, and slowly lower the leg.  Perform this exercise against resistance later as your leg gets stronger.  Leg Slides: Lying on your back, slowly slide your foot toward your buttocks, bending your knee up off the floor (only go as far as is comfortable). Then slowly slide your foot back down until your leg is flat on the floor again.  Angel Wings: Lying on your back spread your legs to the side as far apart as you can without causing discomfort.  Hamstring Strength:  Lying on your back, push your heel against the floor with your leg straight by tightening up the muscles of your buttocks.  Repeat, but this time bend your knee to a comfortable angle, and push your heel against the floor.  You may put a pillow  under the heel to make it more comfortable if necessary.   A rehabilitation program following joint replacement surgery can speed recovery and prevent re-injury in the future due to weakened muscles. Contact your doctor or a physical therapist for more information on knee rehabilitation.    CONSTIPATION  Constipation is defined medically as fewer than three stools per week and severe constipation as less than one stool per week.  Even if you have a regular bowel pattern at home, your normal regimen is likely to be disrupted due to multiple reasons following surgery.  Combination of anesthesia, postoperative narcotics, change in appetite and fluid intake all can affect your bowels.   YOU MUST use at least one of the following options; they are listed in order of increasing strength to get the job done.  They are all available over the counter, and you may need to use some, POSSIBLY even all of these options:    Drink plenty of fluids (prune juice may be helpful) and high fiber foods Colace 100 mg by mouth twice a day  Senokot for constipation as directed and as needed Dulcolax (bisacodyl), take with full glass of water  Miralax (polyethylene glycol) once or twice a day as needed.  If you have tried all these things and are unable to have a bowel movement in the first 3-4 days after surgery call either your surgeon or your primary doctor.    If you experience loose stools or diarrhea, hold the medications until you stool forms back up.  If your symptoms do not get better within 1 week or if they get worse, check with your doctor.  If you experience "the worst abdominal pain ever" or develop nausea or vomiting, please contact the office immediately for further recommendations for treatment.   ITCHING:  If you experience itching with your medications, try taking only a single pain pill, or even half a pain pill at a time.  You can also use Benadryl over the counter for itching or also to help with  sleep.   TED HOSE STOCKINGS:  Use stockings on both legs until for at least 2 weeks or as directed by physician office. They may be removed at night for sleeping.  MEDICATIONS:  See your medication summary on the "After Visit Summary" that nursing will review with you.  You may have some home medications which will be placed on hold until you complete the course of blood thinner medication.  It is important for you to complete the blood thinner medication as prescribed.  PRECAUTIONS:  If you experience chest pain or shortness of breath - call 911 immediately for transfer to the hospital emergency department.   If you develop  a fever greater that 101 F, purulent drainage from wound, increased redness or drainage from wound, foul odor from the wound/dressing, or calf pain - CONTACT YOUR SURGEON.                                                   FOLLOW-UP APPOINTMENTS:  If you do not already have a post-op appointment, please call the office for an appointment to be seen by your surgeon.  Guidelines for how soon to be seen are listed in your "After Visit Summary", but are typically between 1-4 weeks after surgery.  OTHER INSTRUCTIONS:   Knee Replacement:  Do not place pillow under knee, focus on keeping the knee straight while resting. CPM instructions: 0-90 degrees, 2 hours in the morning, 2 hours in the afternoon, and 2 hours in the evening. Place foam block, curve side up under heel at all times except when in CPM or when walking.  DO NOT modify, tear, cut, or change the foam block in any way.  POST-OPERATIVE OPIOID TAPER INSTRUCTIONS: It is important to wean off of your opioid medication as soon as possible. If you do not need pain medication after your surgery it is ok to stop day one. Opioids include: Codeine, Hydrocodone(Norco, Vicodin), Oxycodone(Percocet, oxycontin) and hydromorphone amongst others.  Long term and even short term use of opiods can cause: Increased pain  response Dependence Constipation Depression Respiratory depression And more.  Withdrawal symptoms can include Flu like symptoms Nausea, vomiting And more Techniques to manage these symptoms Hydrate well Eat regular healthy meals Stay active Use relaxation techniques(deep breathing, meditating, yoga) Do Not substitute Alcohol to help with tapering If you have been on opioids for less than two weeks and do not have pain than it is ok to stop all together.  Plan to wean off of opioids This plan should start within one week post op of your joint replacement. Maintain the same interval or time between taking each dose and first decrease the dose.  Cut the total daily intake of opioids by one tablet each day Next start to increase the time between doses. The last dose that should be eliminated is the evening dose.   MAKE SURE YOU:  Understand these instructions.  Get help right away if you are not doing well or get worse.    Thank you for letting us be a part of your medical care team.  It is a privilege we respect greatly.  We hope these instructions will help you stay on track for a fast and full recovery!        Mike Craze [place], [place] [phone]  12/19/2020 11:37 AM

## 2020-12-19 NOTE — Telephone Encounter (Signed)
Spoke with patient and confirmed that he did get his prescriptions from the pharmacy.

## 2020-12-20 DIAGNOSIS — I2510 Atherosclerotic heart disease of native coronary artery without angina pectoris: Secondary | ICD-10-CM | POA: Diagnosis not present

## 2020-12-20 DIAGNOSIS — M4800 Spinal stenosis, site unspecified: Secondary | ICD-10-CM | POA: Diagnosis not present

## 2020-12-20 DIAGNOSIS — K219 Gastro-esophageal reflux disease without esophagitis: Secondary | ICD-10-CM | POA: Diagnosis not present

## 2020-12-20 DIAGNOSIS — I350 Nonrheumatic aortic (valve) stenosis: Secondary | ICD-10-CM | POA: Diagnosis not present

## 2020-12-20 DIAGNOSIS — Z951 Presence of aortocoronary bypass graft: Secondary | ICD-10-CM | POA: Diagnosis not present

## 2020-12-20 DIAGNOSIS — Z7982 Long term (current) use of aspirin: Secondary | ICD-10-CM | POA: Diagnosis not present

## 2020-12-20 DIAGNOSIS — Z87891 Personal history of nicotine dependence: Secondary | ICD-10-CM | POA: Diagnosis not present

## 2020-12-20 DIAGNOSIS — I10 Essential (primary) hypertension: Secondary | ICD-10-CM | POA: Diagnosis not present

## 2020-12-20 DIAGNOSIS — Z471 Aftercare following joint replacement surgery: Secondary | ICD-10-CM | POA: Diagnosis not present

## 2020-12-20 DIAGNOSIS — G8929 Other chronic pain: Secondary | ICD-10-CM | POA: Diagnosis not present

## 2020-12-20 DIAGNOSIS — M5430 Sciatica, unspecified side: Secondary | ICD-10-CM | POA: Diagnosis not present

## 2020-12-20 DIAGNOSIS — J302 Other seasonal allergic rhinitis: Secondary | ICD-10-CM | POA: Diagnosis not present

## 2020-12-20 DIAGNOSIS — Z8701 Personal history of pneumonia (recurrent): Secondary | ICD-10-CM | POA: Diagnosis not present

## 2020-12-20 DIAGNOSIS — E782 Mixed hyperlipidemia: Secondary | ICD-10-CM | POA: Diagnosis not present

## 2020-12-20 DIAGNOSIS — Z96652 Presence of left artificial knee joint: Secondary | ICD-10-CM | POA: Diagnosis not present

## 2020-12-21 DIAGNOSIS — Z96652 Presence of left artificial knee joint: Secondary | ICD-10-CM | POA: Diagnosis not present

## 2020-12-21 DIAGNOSIS — G8929 Other chronic pain: Secondary | ICD-10-CM | POA: Diagnosis not present

## 2020-12-21 DIAGNOSIS — E782 Mixed hyperlipidemia: Secondary | ICD-10-CM | POA: Diagnosis not present

## 2020-12-21 DIAGNOSIS — Z471 Aftercare following joint replacement surgery: Secondary | ICD-10-CM | POA: Diagnosis not present

## 2020-12-21 DIAGNOSIS — Z951 Presence of aortocoronary bypass graft: Secondary | ICD-10-CM | POA: Diagnosis not present

## 2020-12-21 DIAGNOSIS — M4800 Spinal stenosis, site unspecified: Secondary | ICD-10-CM | POA: Diagnosis not present

## 2020-12-21 DIAGNOSIS — K219 Gastro-esophageal reflux disease without esophagitis: Secondary | ICD-10-CM | POA: Diagnosis not present

## 2020-12-21 DIAGNOSIS — I2510 Atherosclerotic heart disease of native coronary artery without angina pectoris: Secondary | ICD-10-CM | POA: Diagnosis not present

## 2020-12-21 DIAGNOSIS — Z8701 Personal history of pneumonia (recurrent): Secondary | ICD-10-CM | POA: Diagnosis not present

## 2020-12-21 DIAGNOSIS — J302 Other seasonal allergic rhinitis: Secondary | ICD-10-CM | POA: Diagnosis not present

## 2020-12-21 DIAGNOSIS — I10 Essential (primary) hypertension: Secondary | ICD-10-CM | POA: Diagnosis not present

## 2020-12-21 DIAGNOSIS — Z87891 Personal history of nicotine dependence: Secondary | ICD-10-CM | POA: Diagnosis not present

## 2020-12-21 DIAGNOSIS — M5430 Sciatica, unspecified side: Secondary | ICD-10-CM | POA: Diagnosis not present

## 2020-12-21 DIAGNOSIS — Z7982 Long term (current) use of aspirin: Secondary | ICD-10-CM | POA: Diagnosis not present

## 2020-12-21 DIAGNOSIS — I350 Nonrheumatic aortic (valve) stenosis: Secondary | ICD-10-CM | POA: Diagnosis not present

## 2020-12-23 DIAGNOSIS — M4800 Spinal stenosis, site unspecified: Secondary | ICD-10-CM | POA: Diagnosis not present

## 2020-12-23 DIAGNOSIS — I350 Nonrheumatic aortic (valve) stenosis: Secondary | ICD-10-CM | POA: Diagnosis not present

## 2020-12-23 DIAGNOSIS — I2510 Atherosclerotic heart disease of native coronary artery without angina pectoris: Secondary | ICD-10-CM | POA: Diagnosis not present

## 2020-12-23 DIAGNOSIS — G8929 Other chronic pain: Secondary | ICD-10-CM | POA: Diagnosis not present

## 2020-12-23 DIAGNOSIS — E782 Mixed hyperlipidemia: Secondary | ICD-10-CM | POA: Diagnosis not present

## 2020-12-23 DIAGNOSIS — Z7982 Long term (current) use of aspirin: Secondary | ICD-10-CM | POA: Diagnosis not present

## 2020-12-23 DIAGNOSIS — Z471 Aftercare following joint replacement surgery: Secondary | ICD-10-CM | POA: Diagnosis not present

## 2020-12-23 DIAGNOSIS — Z951 Presence of aortocoronary bypass graft: Secondary | ICD-10-CM | POA: Diagnosis not present

## 2020-12-23 DIAGNOSIS — Z87891 Personal history of nicotine dependence: Secondary | ICD-10-CM | POA: Diagnosis not present

## 2020-12-23 DIAGNOSIS — I10 Essential (primary) hypertension: Secondary | ICD-10-CM | POA: Diagnosis not present

## 2020-12-23 DIAGNOSIS — Z96652 Presence of left artificial knee joint: Secondary | ICD-10-CM | POA: Diagnosis not present

## 2020-12-23 DIAGNOSIS — J302 Other seasonal allergic rhinitis: Secondary | ICD-10-CM | POA: Diagnosis not present

## 2020-12-23 DIAGNOSIS — M5430 Sciatica, unspecified side: Secondary | ICD-10-CM | POA: Diagnosis not present

## 2020-12-23 DIAGNOSIS — Z8701 Personal history of pneumonia (recurrent): Secondary | ICD-10-CM | POA: Diagnosis not present

## 2020-12-23 DIAGNOSIS — K219 Gastro-esophageal reflux disease without esophagitis: Secondary | ICD-10-CM | POA: Diagnosis not present

## 2020-12-24 DIAGNOSIS — I350 Nonrheumatic aortic (valve) stenosis: Secondary | ICD-10-CM | POA: Diagnosis not present

## 2020-12-24 DIAGNOSIS — E782 Mixed hyperlipidemia: Secondary | ICD-10-CM | POA: Diagnosis not present

## 2020-12-24 DIAGNOSIS — M4800 Spinal stenosis, site unspecified: Secondary | ICD-10-CM | POA: Diagnosis not present

## 2020-12-24 DIAGNOSIS — J302 Other seasonal allergic rhinitis: Secondary | ICD-10-CM | POA: Diagnosis not present

## 2020-12-24 DIAGNOSIS — G8929 Other chronic pain: Secondary | ICD-10-CM | POA: Diagnosis not present

## 2020-12-24 DIAGNOSIS — Z96652 Presence of left artificial knee joint: Secondary | ICD-10-CM | POA: Diagnosis not present

## 2020-12-24 DIAGNOSIS — Z8701 Personal history of pneumonia (recurrent): Secondary | ICD-10-CM | POA: Diagnosis not present

## 2020-12-24 DIAGNOSIS — I2510 Atherosclerotic heart disease of native coronary artery without angina pectoris: Secondary | ICD-10-CM | POA: Diagnosis not present

## 2020-12-24 DIAGNOSIS — I10 Essential (primary) hypertension: Secondary | ICD-10-CM | POA: Diagnosis not present

## 2020-12-24 DIAGNOSIS — Z471 Aftercare following joint replacement surgery: Secondary | ICD-10-CM | POA: Diagnosis not present

## 2020-12-24 DIAGNOSIS — K219 Gastro-esophageal reflux disease without esophagitis: Secondary | ICD-10-CM | POA: Diagnosis not present

## 2020-12-24 DIAGNOSIS — Z87891 Personal history of nicotine dependence: Secondary | ICD-10-CM | POA: Diagnosis not present

## 2020-12-24 DIAGNOSIS — M5430 Sciatica, unspecified side: Secondary | ICD-10-CM | POA: Diagnosis not present

## 2020-12-24 DIAGNOSIS — Z951 Presence of aortocoronary bypass graft: Secondary | ICD-10-CM | POA: Diagnosis not present

## 2020-12-24 DIAGNOSIS — Z7982 Long term (current) use of aspirin: Secondary | ICD-10-CM | POA: Diagnosis not present

## 2020-12-26 ENCOUNTER — Other Ambulatory Visit: Payer: Self-pay | Admitting: Orthopaedic Surgery

## 2020-12-26 ENCOUNTER — Telehealth: Payer: Self-pay | Admitting: Orthopaedic Surgery

## 2020-12-26 DIAGNOSIS — M4800 Spinal stenosis, site unspecified: Secondary | ICD-10-CM | POA: Diagnosis not present

## 2020-12-26 DIAGNOSIS — Z7982 Long term (current) use of aspirin: Secondary | ICD-10-CM | POA: Diagnosis not present

## 2020-12-26 DIAGNOSIS — Z96651 Presence of right artificial knee joint: Secondary | ICD-10-CM

## 2020-12-26 DIAGNOSIS — Z96652 Presence of left artificial knee joint: Secondary | ICD-10-CM | POA: Diagnosis not present

## 2020-12-26 DIAGNOSIS — Z87891 Personal history of nicotine dependence: Secondary | ICD-10-CM | POA: Diagnosis not present

## 2020-12-26 DIAGNOSIS — Z8701 Personal history of pneumonia (recurrent): Secondary | ICD-10-CM | POA: Diagnosis not present

## 2020-12-26 DIAGNOSIS — Z471 Aftercare following joint replacement surgery: Secondary | ICD-10-CM | POA: Diagnosis not present

## 2020-12-26 DIAGNOSIS — I10 Essential (primary) hypertension: Secondary | ICD-10-CM | POA: Diagnosis not present

## 2020-12-26 DIAGNOSIS — M5430 Sciatica, unspecified side: Secondary | ICD-10-CM | POA: Diagnosis not present

## 2020-12-26 DIAGNOSIS — G8929 Other chronic pain: Secondary | ICD-10-CM | POA: Diagnosis not present

## 2020-12-26 DIAGNOSIS — K219 Gastro-esophageal reflux disease without esophagitis: Secondary | ICD-10-CM | POA: Diagnosis not present

## 2020-12-26 DIAGNOSIS — I2510 Atherosclerotic heart disease of native coronary artery without angina pectoris: Secondary | ICD-10-CM | POA: Diagnosis not present

## 2020-12-26 DIAGNOSIS — J302 Other seasonal allergic rhinitis: Secondary | ICD-10-CM | POA: Diagnosis not present

## 2020-12-26 DIAGNOSIS — E782 Mixed hyperlipidemia: Secondary | ICD-10-CM | POA: Diagnosis not present

## 2020-12-26 DIAGNOSIS — I350 Nonrheumatic aortic (valve) stenosis: Secondary | ICD-10-CM | POA: Diagnosis not present

## 2020-12-26 DIAGNOSIS — Z951 Presence of aortocoronary bypass graft: Secondary | ICD-10-CM | POA: Diagnosis not present

## 2020-12-26 MED ORDER — OXYCODONE HCL 5 MG PO TABS: 5.0000 mg | ORAL_TABLET | ORAL | Status: DC | PRN

## 2020-12-26 MED ORDER — OXYCODONE-ACETAMINOPHEN 5-325 MG PO TABS
1.0000 | ORAL_TABLET | ORAL | 0 refills | Status: DC | PRN
Start: 2020-12-26 — End: 2021-04-17

## 2020-12-26 MED ORDER — OXYCODONE-ACETAMINOPHEN 5-325 MG PO TABS
1.0000 | ORAL_TABLET | ORAL | 0 refills | Status: DC | PRN
Start: 2020-12-26 — End: 2020-12-26

## 2020-12-26 NOTE — Telephone Encounter (Signed)
Pt wife called and states pt only has enough pain medication for 2 more days and was wondering if you could call in a refill until pt next appt.   CB [id number]

## 2020-12-26 NOTE — Telephone Encounter (Signed)
Sent in

## 2020-12-28 DIAGNOSIS — J302 Other seasonal allergic rhinitis: Secondary | ICD-10-CM | POA: Diagnosis not present

## 2020-12-28 DIAGNOSIS — Z951 Presence of aortocoronary bypass graft: Secondary | ICD-10-CM | POA: Diagnosis not present

## 2020-12-28 DIAGNOSIS — Z471 Aftercare following joint replacement surgery: Secondary | ICD-10-CM | POA: Diagnosis not present

## 2020-12-28 DIAGNOSIS — I10 Essential (primary) hypertension: Secondary | ICD-10-CM | POA: Diagnosis not present

## 2020-12-28 DIAGNOSIS — I350 Nonrheumatic aortic (valve) stenosis: Secondary | ICD-10-CM | POA: Diagnosis not present

## 2020-12-28 DIAGNOSIS — Z96652 Presence of left artificial knee joint: Secondary | ICD-10-CM | POA: Diagnosis not present

## 2020-12-28 DIAGNOSIS — M4800 Spinal stenosis, site unspecified: Secondary | ICD-10-CM | POA: Diagnosis not present

## 2020-12-28 DIAGNOSIS — E782 Mixed hyperlipidemia: Secondary | ICD-10-CM | POA: Diagnosis not present

## 2020-12-28 DIAGNOSIS — I2510 Atherosclerotic heart disease of native coronary artery without angina pectoris: Secondary | ICD-10-CM | POA: Diagnosis not present

## 2020-12-28 DIAGNOSIS — M5430 Sciatica, unspecified side: Secondary | ICD-10-CM | POA: Diagnosis not present

## 2020-12-28 DIAGNOSIS — Z7982 Long term (current) use of aspirin: Secondary | ICD-10-CM | POA: Diagnosis not present

## 2020-12-28 DIAGNOSIS — Z8701 Personal history of pneumonia (recurrent): Secondary | ICD-10-CM | POA: Diagnosis not present

## 2020-12-28 DIAGNOSIS — Z87891 Personal history of nicotine dependence: Secondary | ICD-10-CM | POA: Diagnosis not present

## 2020-12-28 DIAGNOSIS — G8929 Other chronic pain: Secondary | ICD-10-CM | POA: Diagnosis not present

## 2020-12-28 DIAGNOSIS — K219 Gastro-esophageal reflux disease without esophagitis: Secondary | ICD-10-CM | POA: Diagnosis not present

## 2020-12-31 DIAGNOSIS — M4800 Spinal stenosis, site unspecified: Secondary | ICD-10-CM | POA: Diagnosis not present

## 2020-12-31 DIAGNOSIS — I2510 Atherosclerotic heart disease of native coronary artery without angina pectoris: Secondary | ICD-10-CM | POA: Diagnosis not present

## 2020-12-31 DIAGNOSIS — Z87891 Personal history of nicotine dependence: Secondary | ICD-10-CM | POA: Diagnosis not present

## 2020-12-31 DIAGNOSIS — Z7982 Long term (current) use of aspirin: Secondary | ICD-10-CM | POA: Diagnosis not present

## 2020-12-31 DIAGNOSIS — Z951 Presence of aortocoronary bypass graft: Secondary | ICD-10-CM | POA: Diagnosis not present

## 2020-12-31 DIAGNOSIS — M5430 Sciatica, unspecified side: Secondary | ICD-10-CM | POA: Diagnosis not present

## 2020-12-31 DIAGNOSIS — Z96652 Presence of left artificial knee joint: Secondary | ICD-10-CM | POA: Diagnosis not present

## 2020-12-31 DIAGNOSIS — I350 Nonrheumatic aortic (valve) stenosis: Secondary | ICD-10-CM | POA: Diagnosis not present

## 2020-12-31 DIAGNOSIS — Z471 Aftercare following joint replacement surgery: Secondary | ICD-10-CM | POA: Diagnosis not present

## 2020-12-31 DIAGNOSIS — J302 Other seasonal allergic rhinitis: Secondary | ICD-10-CM | POA: Diagnosis not present

## 2020-12-31 DIAGNOSIS — G8929 Other chronic pain: Secondary | ICD-10-CM | POA: Diagnosis not present

## 2020-12-31 DIAGNOSIS — K219 Gastro-esophageal reflux disease without esophagitis: Secondary | ICD-10-CM | POA: Diagnosis not present

## 2020-12-31 DIAGNOSIS — E782 Mixed hyperlipidemia: Secondary | ICD-10-CM | POA: Diagnosis not present

## 2020-12-31 DIAGNOSIS — Z8701 Personal history of pneumonia (recurrent): Secondary | ICD-10-CM | POA: Diagnosis not present

## 2020-12-31 DIAGNOSIS — I10 Essential (primary) hypertension: Secondary | ICD-10-CM | POA: Diagnosis not present

## 2021-01-01 ENCOUNTER — Ambulatory Visit (INDEPENDENT_AMBULATORY_CARE_PROVIDER_SITE_OTHER): Payer: BC Managed Care – PPO | Admitting: Orthopaedic Surgery

## 2021-01-01 ENCOUNTER — Other Ambulatory Visit: Payer: Self-pay

## 2021-01-01 ENCOUNTER — Ambulatory Visit (INDEPENDENT_AMBULATORY_CARE_PROVIDER_SITE_OTHER): Payer: BC Managed Care – PPO

## 2021-01-01 ENCOUNTER — Encounter: Payer: Self-pay | Admitting: Orthopaedic Surgery

## 2021-01-01 VITALS — Ht 68.0 in | Wt 153.0 lb

## 2021-01-01 DIAGNOSIS — Z96652 Presence of left artificial knee joint: Secondary | ICD-10-CM

## 2021-01-01 DIAGNOSIS — M1712 Unilateral primary osteoarthritis, left knee: Secondary | ICD-10-CM

## 2021-01-01 NOTE — Progress Notes (Signed)
Office Visit Note   Patient: Philip Winters           Date of Birth: 04-Mar-1961           MRN: [id number] Visit Date: 01/01/2021              Requested by: Antony Contras, MD [address] [place],  [place] [address] PCP: Antony Contras, MD   Assessment & Plan: Visit Diagnoses:  1. S/P total knee arthroplasty, left   2. Primary osteoarthritis of left knee   3. Total knee replacement status, left     Plan: 2-week status post primary left total knee replacement doing well.  Not using any ambulatory aid.  Incision healing nicely.  Clips removed and Steri-Strips applied.  Has -5 to 95 degrees of flexion.  Has 2 more sessions of in-home therapy and begin outpatient therapy next week.  Taking less and less of the oxycodone.  We will continue with the aspirin and return in 1 month.  Doing quite well and does not have the pain that he had preoperatively  Follow-Up Instructions: Return in about 1 month (around 02/01/2021).   Orders:  Orders Placed This Encounter  Procedures   XR KNEE 3 VIEW LEFT   Ambulatory referral to Physical Therapy   No orders of the defined types were placed in this encounter.     Procedures: No procedures performed   Clinical Data: No additional findings.   Subjective: Chief Complaint  Patient presents with   Left Knee - Follow-up    Left total knee arthroplasty 12/18/2020  Patient presents today for a follow up on his left knee. He had a left total knee arthroplasty on 12/18/2020. He is now two weeks out from surgery. Patient is doing well. Walking without assistance. He is taking oxycodone. He is doing home health physical therapy three times weekly.  Denies fever chills shortness of breath or chest pain  HPI  Review of Systems   Objective: Vital Signs: Ht '5\' 8"'$  (1.727 m)   Wt 153 lb (69.4 kg)   BMI 23.26 kg/m   Physical Exam  Ortho Exam left knee incision healing without a problem.  Clips removed and Steri-Strips applied.  Lacks about  5 degrees to full extension flexes about 95 degrees.  No instability.  Still has some induration of the thigh probably related to the tourniquet.  Motor and sensory exam intact.  No calf pain.  No obvious effusion.  No localized areas of tenderness  Specialty Comments:  No specialty comments available.  Imaging: XR KNEE 3 VIEW LEFT  Result Date: 01/01/2021 3 views of the left total knee replacement were obtained demonstrating excellent alignment.  Nice glue mantle in all 3 compartments.  Patella tracks in the midline.  No complications    PMFS History: Patient Active Problem List   Diagnosis Date Noted   Total knee replacement status, left 12/18/2020   Adhesive capsulitis of right shoulder 09/26/2020   Carpal tunnel syndrome, bilateral upper limbs 09/26/2020   Bilateral hand pain 09/20/2020   Chronic left shoulder pain 08/23/2020   Right arm pain 08/23/2020   Cubital tunnel syndrome on left 08/23/2020   Left ankle sprain 07/12/2020   Visit for wound check 06/11/2020   Change or removal of surgical wound dressing 05/24/2020   Pulmonary nodule, left 05/23/2020   Cellulitis 05/23/2020   S/P CABG x 2 05/02/2020   Coronary artery disease 05/01/2020   Pulmonary nodule 1 cm or greater in  diameter 04/26/2020   Statin intolerance 04/20/2020   Coronary atherosclerosis of native coronary artery 04/18/2020   Abnormal cardiovascular stress test    Nonrheumatic aortic valve stenosis    Exertional dyspnea    Stable angina pectoris (HCC)    Bicuspid aortic valve 04/06/2020   Primary osteoarthritis of left knee 11/08/2019   Bilateral primary osteoarthritis of knee 09/28/2019   HNP (herniated nucleus pulposus), lumbar 09/29/2017   Right carotid bruit 06/10/2017   Paresthesia 06/10/2017   Left wrist pain 10/09/2016   Unilateral primary osteoarthritis, right knee 06/24/2016   S/P total knee replacement using cement, right 06/24/2016   Acute pain of left knee 03/27/2016   Mixed hyperlipidemia  01/18/2007   HYPERTENSION 01/18/2007   Past Medical History:  Diagnosis Date   Allergy    Arthritis    Coronary artery disease    GERD (gastroesophageal reflux disease)    occ   Heart murmur    baby   High cholesterol    Hypertension    Pneumonia    Right carotid bruit 06/10/2017   Sciatica    Seasonal allergies    Sinus tachycardia    Spinal stenosis     History reviewed. No pertinent family history.  Past Surgical History:  Procedure Laterality Date   CARDIAC CATHETERIZATION     CARPAL TUNNEL WITH CUBITAL TUNNEL Left 11/10/2013   Procedure: LEFT CARPAL TUNNEL RELEASE;  Surgeon: Cammie Sickle, MD;  Location: [facility];  Service: Orthopedics;  Laterality: Left;   COLONOSCOPY     CORONARY ARTERY BYPASS GRAFT N/A 05/01/2020   Procedure: CORONARY ARTERY BYPASS GRAFTING (CABG) X 2 ON CARDIOPULMONARY BYPASS. LIMA TO LAD, SVG TO DIAG;  Surgeon: Ivin Poot, MD;  Location: [facility];  Service: Open Heart Surgery;  Laterality: N/A;   ENDOVEIN HARVEST OF GREATER SAPHENOUS VEIN Right 05/01/2020   Procedure: ENDOVEIN HARVEST OF GREATER SAPHENOUS VEIN;  Surgeon: Ivin Poot, MD;  Location: [facility];  Service: Open Heart Surgery;  Laterality: Right;   KNEE ARTHROSCOPY  [id number]   left and right   LUMBAR LAMINECTOMY/DECOMPRESSION MICRODISCECTOMY Left 09/29/2017   Procedure: LEFT LUMBAR TWO - LUMBAR THREELAMINOTOMY/MICRODISCECTOMY;  Surgeon: Jovita Gamma, MD;  Location: [facility];  Service: Neurosurgery;  Laterality: Left;  LEFT LUMBAR 2- LUMBAR 3 LAMINOTOMY/MICRODISCECTOMY   NASAL SINUS SURGERY  05/2015   RIGHT/LEFT HEART CATH AND CORONARY ANGIOGRAPHY N/A 04/10/2020   Procedure: RIGHT/LEFT HEART CATH AND CORONARY ANGIOGRAPHY;  Surgeon: Troy Sine, MD;  Location: [facility];  Service: Cardiovascular;  Laterality: N/A;   TEE WITHOUT CARDIOVERSION N/A 05/01/2020   Procedure: TRANSESOPHAGEAL ECHOCARDIOGRAM (TEE);  Surgeon: Prescott Gum, Collier Salina, MD;  Location: [facility];  Service: Open Heart Surgery;  Laterality: N/A;   TOTAL KNEE ARTHROPLASTY Right 06/24/2016   Procedure: TOTAL KNEE ARTHROPLASTY;  Surgeon: Garald Balding, MD;  Location: [facility];  Service: Orthopedics;  Laterality: Right;   TOTAL KNEE ARTHROPLASTY Left 12/18/2020   Procedure: LEFT TOTAL KNEE ARTHROPLASTY;  Surgeon: Garald Balding, MD;  Location: [facility];  Service: Orthopedics;  Laterality: Left;   TRIGGER FINGER RELEASE Left 11/10/2013   Procedure: LEFT INDEX A-1 PULLEY RELEASE;  Surgeon: Cammie Sickle, MD;  Location: [facility];  Service: Orthopedics;  Laterality: Left;   ULNAR NERVE TRANSPOSITION Left 11/10/2013   Procedure: ULNAR NERVE TRANSPOSITION;  Surgeon: Cammie Sickle, MD;  Location: [facility];  Service: Orthopedics;  Laterality: Left;   Social  History   Occupational History   Occupation: Self employed  Tobacco Use   Smoking status: Former    Packs/day: 1.00    Years: 37.00    Pack years: 37.00    Types: Cigarettes    Quit date: 05/04/2016    Years since quitting: 4.6   Smokeless tobacco: Never  Vaping Use   Vaping Use: Former  Substance and Sexual Activity   Alcohol use: Yes    Alcohol/week: 3.0 standard drinks    Types: 3 Glasses of wine per week    Comment: weekends only with dinner   Drug use: No   Sexual activity: Not on file

## 2021-01-02 DIAGNOSIS — Z96652 Presence of left artificial knee joint: Secondary | ICD-10-CM | POA: Diagnosis not present

## 2021-01-02 DIAGNOSIS — Z951 Presence of aortocoronary bypass graft: Secondary | ICD-10-CM | POA: Diagnosis not present

## 2021-01-02 DIAGNOSIS — I10 Essential (primary) hypertension: Secondary | ICD-10-CM | POA: Diagnosis not present

## 2021-01-02 DIAGNOSIS — J302 Other seasonal allergic rhinitis: Secondary | ICD-10-CM | POA: Diagnosis not present

## 2021-01-02 DIAGNOSIS — Z8701 Personal history of pneumonia (recurrent): Secondary | ICD-10-CM | POA: Diagnosis not present

## 2021-01-02 DIAGNOSIS — G8929 Other chronic pain: Secondary | ICD-10-CM | POA: Diagnosis not present

## 2021-01-02 DIAGNOSIS — Z87891 Personal history of nicotine dependence: Secondary | ICD-10-CM | POA: Diagnosis not present

## 2021-01-02 DIAGNOSIS — M4800 Spinal stenosis, site unspecified: Secondary | ICD-10-CM | POA: Diagnosis not present

## 2021-01-02 DIAGNOSIS — I2510 Atherosclerotic heart disease of native coronary artery without angina pectoris: Secondary | ICD-10-CM | POA: Diagnosis not present

## 2021-01-02 DIAGNOSIS — Z7982 Long term (current) use of aspirin: Secondary | ICD-10-CM | POA: Diagnosis not present

## 2021-01-02 DIAGNOSIS — K219 Gastro-esophageal reflux disease without esophagitis: Secondary | ICD-10-CM | POA: Diagnosis not present

## 2021-01-02 DIAGNOSIS — M5430 Sciatica, unspecified side: Secondary | ICD-10-CM | POA: Diagnosis not present

## 2021-01-02 DIAGNOSIS — I350 Nonrheumatic aortic (valve) stenosis: Secondary | ICD-10-CM | POA: Diagnosis not present

## 2021-01-02 DIAGNOSIS — Z471 Aftercare following joint replacement surgery: Secondary | ICD-10-CM | POA: Diagnosis not present

## 2021-01-02 DIAGNOSIS — E782 Mixed hyperlipidemia: Secondary | ICD-10-CM | POA: Diagnosis not present

## 2021-01-03 ENCOUNTER — Telehealth: Payer: Self-pay

## 2021-01-03 NOTE — Telephone Encounter (Signed)
Called and advised patient of the information below. He will call Dr.Stern's office to get his injection scheduled after 2 weeks.

## 2021-01-03 NOTE — Telephone Encounter (Signed)
Pt called and would like to know if Dr. Durward Fortes can give the okay for pt to have an injection in his back . They said they wouldn't do it without Dr. Rudene Anda okay since he just had knee surgery. They were unsure if it would effect his recovery in any way.   Pt also would like to know if he can take a half of a oxycodone and Tylenol instead of just taking his oxycodone all day long.

## 2021-01-03 NOTE — Telephone Encounter (Signed)
I would wait at least 2 weeks for the cortisone injection..take the least amount of oxycodone I.e. either 1 or a half

## 2021-01-04 DIAGNOSIS — I2510 Atherosclerotic heart disease of native coronary artery without angina pectoris: Secondary | ICD-10-CM | POA: Diagnosis not present

## 2021-01-04 DIAGNOSIS — Z471 Aftercare following joint replacement surgery: Secondary | ICD-10-CM | POA: Diagnosis not present

## 2021-01-04 DIAGNOSIS — Z7982 Long term (current) use of aspirin: Secondary | ICD-10-CM | POA: Diagnosis not present

## 2021-01-04 DIAGNOSIS — K219 Gastro-esophageal reflux disease without esophagitis: Secondary | ICD-10-CM | POA: Diagnosis not present

## 2021-01-04 DIAGNOSIS — Z87891 Personal history of nicotine dependence: Secondary | ICD-10-CM | POA: Diagnosis not present

## 2021-01-04 DIAGNOSIS — M4800 Spinal stenosis, site unspecified: Secondary | ICD-10-CM | POA: Diagnosis not present

## 2021-01-04 DIAGNOSIS — M5430 Sciatica, unspecified side: Secondary | ICD-10-CM | POA: Diagnosis not present

## 2021-01-04 DIAGNOSIS — J302 Other seasonal allergic rhinitis: Secondary | ICD-10-CM | POA: Diagnosis not present

## 2021-01-04 DIAGNOSIS — Z951 Presence of aortocoronary bypass graft: Secondary | ICD-10-CM | POA: Diagnosis not present

## 2021-01-04 DIAGNOSIS — Z8701 Personal history of pneumonia (recurrent): Secondary | ICD-10-CM | POA: Diagnosis not present

## 2021-01-04 DIAGNOSIS — I10 Essential (primary) hypertension: Secondary | ICD-10-CM | POA: Diagnosis not present

## 2021-01-04 DIAGNOSIS — I350 Nonrheumatic aortic (valve) stenosis: Secondary | ICD-10-CM | POA: Diagnosis not present

## 2021-01-04 DIAGNOSIS — E782 Mixed hyperlipidemia: Secondary | ICD-10-CM | POA: Diagnosis not present

## 2021-01-04 DIAGNOSIS — G8929 Other chronic pain: Secondary | ICD-10-CM | POA: Diagnosis not present

## 2021-01-04 DIAGNOSIS — Z96652 Presence of left artificial knee joint: Secondary | ICD-10-CM | POA: Diagnosis not present

## 2021-01-08 ENCOUNTER — Ambulatory Visit (INDEPENDENT_AMBULATORY_CARE_PROVIDER_SITE_OTHER): Payer: BC Managed Care – PPO | Admitting: Physical Therapy

## 2021-01-08 ENCOUNTER — Telehealth: Payer: Self-pay | Admitting: Physical Therapy

## 2021-01-08 ENCOUNTER — Other Ambulatory Visit: Payer: Self-pay

## 2021-01-08 ENCOUNTER — Ambulatory Visit: Payer: BC Managed Care – PPO | Admitting: Rehabilitative and Restorative Service Providers"

## 2021-01-08 ENCOUNTER — Encounter: Payer: Self-pay | Admitting: Physical Therapy

## 2021-01-08 VITALS — HR 110

## 2021-01-08 DIAGNOSIS — M25562 Pain in left knee: Secondary | ICD-10-CM | POA: Diagnosis not present

## 2021-01-08 DIAGNOSIS — R2689 Other abnormalities of gait and mobility: Secondary | ICD-10-CM

## 2021-01-08 DIAGNOSIS — M6281 Muscle weakness (generalized): Secondary | ICD-10-CM

## 2021-01-08 DIAGNOSIS — R600 Localized edema: Secondary | ICD-10-CM

## 2021-01-08 DIAGNOSIS — R2681 Unsteadiness on feet: Secondary | ICD-10-CM

## 2021-01-08 DIAGNOSIS — M25662 Stiffness of left knee, not elsewhere classified: Secondary | ICD-10-CM | POA: Diagnosis not present

## 2021-01-08 NOTE — Addendum Note (Signed)
Addended by: Isaias Cowman on: 01/08/2021 07:04 PM   Modules accepted: Orders

## 2021-01-08 NOTE — Patient Instructions (Signed)
Access Code: [email or web address] URL: [email or web address] Date: 01/08/2021 Prepared by: [name]  Exercises Quad Setting and Stretching - 2-4 x daily - 7 x weekly - 5-10 sets - 10 reps - prop 5-10 minutes & quad set5 seconds hold Supine Heel Slide with Strap - 2-3 x daily - 7 x weekly - 2-3 sets - 10 reps - 5 seconds hold Supine Straight Leg Raises - 2-3 x daily - 7 x weekly - 2-3 sets - 10 reps - 5 seconds hold Seated Knee Flexion Extension AROM - 2-4 x daily - 7 x weekly - 2-3 sets - 10 reps - 5 seconds hold Seated straight leg lifts - 2-3 x daily - 7 x weekly - 2-3 sets - 10 reps - 5 seconds hold Seated Hamstring Stretch with Strap - 2-4 x daily - 7 x weekly - 1 sets - 3 reps - 20-30 seconds hold Standing Gastroc Stretch - 2-4 x daily - 7 x weekly - 1 sets - 3 reps - 20-30 seconds hold

## 2021-01-08 NOTE — Therapy (Signed)
[facility] Physical Therapy [address] [place], [place], [address] Phone: [phone]   Fax:  [phone]  Physical Therapy Evaluation  Patient Details  Name: Philip Winters MRN: [id number] Date of Birth: 01/30/1961 Referring Provider (PT): Joni Fears, MD   Encounter Date: 01/08/2021   PT End of Session - 01/08/21 1842     Visit Number 1    Number of Visits 16    Date for PT Re-Evaluation 03/01/21    Authorization Type BCBS    Authorization Time Period ded & oop max met, 30 visit combo PT/OT/chiro    Authorization - Visit Number 1    Authorization - Number of Visits 30    PT Start Time 5465    PT Stop Time 1430    PT Time Calculation (min) 45 min    Activity Tolerance Patient tolerated treatment well    Behavior During Therapy [facility] for tasks assessed/performed             Past Medical History:  Diagnosis Date   Allergy    Arthritis    Coronary artery disease    GERD (gastroesophageal reflux disease)    occ   Heart murmur    baby   High cholesterol    Hypertension    Pneumonia    Right carotid bruit 06/10/2017   Sciatica    Seasonal allergies    Sinus tachycardia    Spinal stenosis     Past Surgical History:  Procedure Laterality Date   CARDIAC CATHETERIZATION     CARPAL TUNNEL WITH CUBITAL TUNNEL Left 11/10/2013   Procedure: LEFT CARPAL TUNNEL RELEASE;  Surgeon: Cammie Sickle, MD;  Location: [place];  Service: Orthopedics;  Laterality: Left;   COLONOSCOPY     CORONARY ARTERY BYPASS GRAFT N/A 05/01/2020   Procedure: CORONARY ARTERY BYPASS GRAFTING (CABG) X 2 ON CARDIOPULMONARY BYPASS. LIMA TO LAD, SVG TO DIAG;  Surgeon: Ivin Poot, MD;  Location: [place];  Service: Open Heart Surgery;  Laterality: N/A;   ENDOVEIN HARVEST OF GREATER SAPHENOUS VEIN Right 05/01/2020   Procedure: ENDOVEIN HARVEST OF GREATER SAPHENOUS VEIN;  Surgeon: Ivin Poot, MD;  Location: [place];  Service: Open Heart Surgery;   Laterality: Right;   KNEE ARTHROSCOPY  6812,7517   left and right   LUMBAR LAMINECTOMY/DECOMPRESSION MICRODISCECTOMY Left 09/29/2017   Procedure: LEFT LUMBAR TWO - LUMBAR THREELAMINOTOMY/MICRODISCECTOMY;  Surgeon: Jovita Gamma, MD;  Location: [place];  Service: Neurosurgery;  Laterality: Left;  LEFT LUMBAR 2- LUMBAR 3 LAMINOTOMY/MICRODISCECTOMY   NASAL SINUS SURGERY  05/2015   RIGHT/LEFT HEART CATH AND CORONARY ANGIOGRAPHY N/A 04/10/2020   Procedure: RIGHT/LEFT HEART CATH AND CORONARY ANGIOGRAPHY;  Surgeon: Troy Sine, MD;  Location: [place] [facility];  Service: Cardiovascular;  Laterality: N/A;   TEE WITHOUT CARDIOVERSION N/A 05/01/2020   Procedure: TRANSESOPHAGEAL ECHOCARDIOGRAM (TEE);  Surgeon: Prescott Gum, Collier Salina, MD;  Location: [place];  Service: Open Heart Surgery;  Laterality: N/A;   TOTAL KNEE ARTHROPLASTY Right 06/24/2016   Procedure: TOTAL KNEE ARTHROPLASTY;  Surgeon: Garald Balding, MD;  Location: [place];  Service: Orthopedics;  Laterality: Right;   TOTAL KNEE ARTHROPLASTY Left 12/18/2020   Procedure: LEFT TOTAL KNEE ARTHROPLASTY;  Surgeon: Garald Balding, MD;  [facility]: [facility];  Service: Orthopedics;  Laterality: Left;   TRIGGER FINGER RELEASE Left 11/10/2013   Procedure: LEFT INDEX A-1 PULLEY RELEASE;  Surgeon: Cammie Sickle, MD;  Location: [place];  Service: Orthopedics;  Laterality: Left;   ULNAR NERVE TRANSPOSITION Left 11/10/2013   Procedure: ULNAR NERVE TRANSPOSITION;  Surgeon: Cammie Sickle, MD;  Location: [place];  Service: Orthopedics;  Laterality: Left;    Vitals:   01/08/21 1400  Pulse: (!) 110  SpO2: 99%      Subjective Assessment - 01/08/21 1352     Subjective This 60yo was referred to PT by Joni Fears, MD with [phone] (ICD-10-CM) - S/P total knee arthroplasty, left which was performed on 12/18/2020. He underwent CABG surgery on 05/01/2020 and had issues in Cardiac Rehab with high resting heart rate limiting his  participation.    Pertinent History CABG, microdiscectomy L2-3, rt TKA, Lt carpal tunnel release, arthritis, CAD, HTN, sciatica,    Patient Stated Goals To better walking & some plumbing    Currently in Pain? Yes    Pain Score 3    In last week, best 3/10 & worst 7/10   Pain Orientation Left;Medial;Lateral    Pain Descriptors / Indicators Stabbing    Pain Type Surgical pain    Pain Onset 1 to 4 weeks ago    Pain Frequency Constant    Aggravating Factors  standing >15-20 minutes    Pain Relieving Factors moving it, resting it    Effect of Pain on Daily Activities limits standing                [facility] PT Assessment - 01/08/21 1345       Assessment   Medical Diagnosis Left Total Knee Arthroplasty  B14.782 (ICD-10-CM)    Referring Provider (PT) Joni Fears, MD    Onset Date/Surgical Date 12/18/20    Hand Dominance Right    Prior Therapy [facility] thru 01/04/2021      Precautions   Precautions Fall      Balance Screen   Has the patient fallen in the past 6 months No    Has the patient had a decrease in activity level because of a fear of falling?  No    Is the patient reluctant to leave their home because of a fear of falling?  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 - get on/off floor, tool box 30#,    Leisure reading, walking dog      Observation/Other Assessments   Focus on Therapeutic Outcomes (FOTO)  40.2898    target 53     Observation/Other Assessments-Edema    Edema Circumferential      Circumferential Edema   Circumferential - Right 15cm proximal to superior = 41cm,  superior patella 36.6cm,  inferior patella 32.7cm    Circumferential - Left  15cm proximal to superior = 44.2cm,  superior patella 41.2cm,  inferior patella 37.0cm      ROM / Strength   AROM / PROM / Strength AROM;PROM;Strength      AROM   AROM  Assessment Site Knee    Right/Left Knee Right;Left    Right Knee Extension -5   seated LAQ   Right Knee Flexion 109   standing   Left Knee Extension -21   seated LAQ   Left Knee Flexion 91   standing     PROM   PROM Assessment Site Knee    Right/Left Knee Right;Left    Right Knee Extension 0    Right Knee Flexion 122   seated   Left Knee Extension -21   seated LAQ   Left Knee Flexion -5   supine     Strength   Strength Assessment Site Knee    Right/Left Knee Right;Left    Right Knee Flexion 5/5    Right Knee Extension 5/5    Left Knee Flexion 3-/5    Left Knee Extension 3-/5      Ambulation/Gait   Ambulation/Gait Yes    Ambulation/Gait Assistance 5: Supervision    Ambulation Distance (Feet) 100 Feet    Assistive device None    Gait Pattern Step-through pattern;Decreased arm swing - left;Decreased step length - right;Decreased stance time - left;Decreased hip/knee flexion - left;Decreased weight shift to left;Left hip hike;Left flexed knee in stance;Antalgic    Ambulation Surface Level;Indoor                        Objective measurements completed on examination: See above findings.       [name] Adult PT Treatment/Exercise - 01/08/21 1345       Exercises   Exercises Knee/Hip;Other Exercises    Other Exercises  see pt instructions for initial HEP - pt performed 1 set of all exercises      Knee/Hip Exercises: Stretches   Other Knee/Hip Stretches supine prop for TKE for 5 minutes performing 3 sets of 10 reps quad sets / leg press out during.                     PT Education - 01/08/21 1430     Education Details Initial HEP Medbridge Access Code: [email or web address]    Person(s) Educated Patient    Methods Explanation;Demonstration;Tactile cues;Verbal cues;Handout    Comprehension Verbalized understanding;Returned demonstration;Verbal cues required;Tactile cues required;Need further instruction              PT Short Term Goals - 01/08/21 1900       PT SHORT TERM GOAL #1   Title Patient is independent with initial HEP    Time 4    Period Weeks    Status New    Target Date 02/01/21      PT SHORT TERM GOAL #2   Title PROM left knee -5* extension to 100* flexion    Time 4    Period Weeks    Status New    Target Date 02/01/21               PT Long Term Goals - 01/08/21 1855       PT LONG TERM GOAL #1   Title FOTO >/= 53%    Time 8    Period Weeks    Status New    Target Date 03/01/21      PT LONG TERM GOAL #2   Title left knee AROM -5* seated to 95* flexion standing    Time 8    Period Weeks    Status New    Target Date 03/01/21      PT LONG  TERM GOAL #3   Title left knee PROM 0 - 105    Time 8    Period Weeks    Status New    Target Date 03/01/21      PT LONG TERM GOAL #4   Title Patient ambulates 500' & negotiates ramps, curbs & stairs single rail without assistive device independently.    Time 8    Period Weeks    Status New    Target Date 03/01/21      PT LONG TERM GOAL #5   Title Patient demonstrates ability to perform light plumbing activities including getting on/off floor and lifting / carrying 20# items.    Time 8    Period Weeks    Status New    Target Date 03/01/21      Additional Long Term Goals   Additional Long Term Goals Yes      PT LONG TERM GOAL #6   Title Patient reports left knee pain </= 2/10 with standing & gait.    Time 8    Period Weeks    Status New    Target Date 03/01/21                    Plan - 01/08/21 1847     Clinical Impression  Statement Pt is a 60y/o male who presents to OPPT s/p Lt TKA on 12/18/20.  He demonstrates decreased strength, ROM, increased edema and pain with gait abnormalities affecting functional mobility.  Pt will benefit from PT to address deficits listed. He had CABG surgery 05/01/2020 with high resting heart rate that limited mobility in Cardiac Rehab.  His resting HR at PT evaluation was 110bpm.  PT sent phone message to Cardiologist for guidance in any limitations or precautions with his OPPT.    Personal Factors and Comorbidities Comorbidity 3+;Fitness    Comorbidities CABG 05/01/2020, microdiscectomy L2-3, rt TKA, Lt carpal tunnel release, arthritis, CAD, HTN, sciatica    Examination-Activity Limitations Lift;Carry;Locomotion Level;Squat;Stairs;Stand    Examination-Participation Restrictions Community Activity    Stability/Clinical Decision Making Stable/Uncomplicated    Clinical Decision Making Moderate    Rehab Potential Good    PT Frequency 2x / week    PT Duration 8 weeks    PT Treatment/Interventions ADLs/Self Care Home Management;Cryotherapy;Electrical Stimulation;Moist Heat;Gait training;Stair training;Functional mobility training;Therapeutic activities;DME Instruction;Therapeutic exercise;Neuromuscular re-education;Balance training;Patient/family education;Manual techniques;Scar mobilization;Passive range of motion;Vasopneumatic Device;Joint Manipulations    PT Next Visit Plan check if response from Dr. Marlou Porch, cardiologist.  Manual therapy & exercises to increase range & strength, update HEP as indicated, vaso to end session    PT Home Exercise Plan Access Code: [email or web address]    Recommended Other Services check with Dr. Candee Furbish, cardiologist on cardiac precautions or limitations.    Consulted and Agree with Plan of Care Patient             Patient will benefit from skilled therapeutic intervention in order to improve the following deficits and impairments:  Abnormal gait, Cardiopulmonary  status limiting activity, Decreased activity tolerance, Decreased balance, Decreased coordination, Decreased endurance, Decreased knowledge of precautions, Decreased knowledge of use of DME, Decreased mobility, Decreased range of motion, Decreased skin integrity, Decreased scar mobility, Decreased strength, Difficulty walking, Increased edema, Impaired flexibility, Pain  Visit Diagnosis: Other abnormalities of gait and mobility  Muscle weakness (generalized)  Stiffness of left knee, not elsewhere classified  Acute pain of left knee  Localized edema  Unsteadiness on feet     Problem List Patient Active  Problem List   Diagnosis Date Noted   Total knee replacement status, left 12/18/2020   Adhesive capsulitis of right shoulder 09/26/2020   Carpal tunnel syndrome, bilateral upper limbs 09/26/2020   Bilateral hand pain 09/20/2020   Chronic left shoulder pain 08/23/2020   Right arm pain 08/23/2020   Cubital tunnel syndrome on left 08/23/2020   Left ankle sprain 07/12/2020   Visit for wound check 06/11/2020   Change or removal of surgical wound dressing 05/24/2020   Pulmonary nodule, left 05/23/2020   Cellulitis 05/23/2020   S/P CABG x 2 05/02/2020   Coronary artery disease 05/01/2020   Pulmonary nodule 1 cm or greater in diameter 04/26/2020   Statin intolerance 04/20/2020   Coronary atherosclerosis of native coronary artery 04/18/2020   Abnormal cardiovascular stress test    Nonrheumatic aortic valve stenosis    Exertional dyspnea    Stable angina pectoris ([place])    Bicuspid aortic valve 04/06/2020   Primary osteoarthritis of left knee 11/08/2019   Bilateral primary osteoarthritis of knee 09/28/2019   HNP (herniated nucleus pulposus), lumbar 09/29/2017   Right carotid bruit 06/10/2017   Paresthesia 06/10/2017   Left wrist pain 10/09/2016   Unilateral primary osteoarthritis, right knee 06/24/2016   S/P total knee replacement using cement, right 06/24/2016   Acute pain of  left knee 03/27/2016   Mixed hyperlipidemia 01/18/2007   HYPERTENSION 01/18/2007    [name], PT, DPT 01/08/2021, 7:02 PM  [facility] Physical Therapy [address] [place], [place], [address] Phone: [phone]   Fax:  [phone]  Name: Philip Winters MRN: [id number] Date of Birth: 04/16/1961

## 2021-01-08 NOTE — Telephone Encounter (Signed)
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 He has been referred to Outpatient PT with [facility].  Can you please advise if he any cardiac precautions or heart rate limitations that need to be included in his rehab?   Thank you for your input to his care, [name], PT, DPT Physical Therapist Specializing in Prosthetic Rehab [facility]. [address] [place], [place] [address] Phone [phone] FAX [phone]

## 2021-01-10 ENCOUNTER — Ambulatory Visit (INDEPENDENT_AMBULATORY_CARE_PROVIDER_SITE_OTHER): Payer: BC Managed Care – PPO | Admitting: Physical Therapy

## 2021-01-10 ENCOUNTER — Other Ambulatory Visit: Payer: Self-pay

## 2021-01-10 DIAGNOSIS — M6281 Muscle weakness (generalized): Secondary | ICD-10-CM | POA: Diagnosis not present

## 2021-01-10 DIAGNOSIS — R600 Localized edema: Secondary | ICD-10-CM

## 2021-01-10 DIAGNOSIS — M25662 Stiffness of left knee, not elsewhere classified: Secondary | ICD-10-CM

## 2021-01-10 DIAGNOSIS — M25562 Pain in left knee: Secondary | ICD-10-CM

## 2021-01-10 DIAGNOSIS — R2689 Other abnormalities of gait and mobility: Secondary | ICD-10-CM

## 2021-01-10 NOTE — Therapy (Signed)
[place], [place], [address] Phone: [phone]   Fax:  [phone]  Physical Therapy Treatment  Patient Details  Name: Philip Winters MRN: [id number] Date of Birth: 07/04/1960 Referring Provider (PT): Joni Fears, MD   Encounter Date: 01/10/2021   PT End of Session - 01/10/21 1324     Visit Number 2    Number of Visits 16    Date for PT Re-Evaluation 03/01/21    Authorization Type BCBS    Authorization Time Period ded & oop max met, 30 visit combo PT/OT/chiro    Authorization - Visit Number 2    Authorization - Number of Visits 30    PT Start Time 1300    PT Stop Time 1345    PT Time Calculation (min) 45 min    Activity Tolerance Patient tolerated treatment well;No increased pain    Behavior During Therapy [facility] for tasks assessed/performed             Past Medical History:  Diagnosis Date   Allergy    Arthritis    Coronary artery disease    GERD (gastroesophageal reflux disease)    occ   Heart murmur    baby   High cholesterol    Hypertension    Pneumonia    Right carotid bruit 06/10/2017   Sciatica    Seasonal allergies    Sinus tachycardia    Spinal stenosis     Past Surgical History:  Procedure Laterality Date   CARDIAC CATHETERIZATION     CARPAL TUNNEL WITH CUBITAL TUNNEL Left 11/10/2013   Procedure: LEFT CARPAL TUNNEL RELEASE;  Surgeon: Cammie Sickle, MD;  Location: [place];  Service: Orthopedics;  Laterality: Left;   COLONOSCOPY     CORONARY ARTERY BYPASS GRAFT N/A 05/01/2020   Procedure: CORONARY ARTERY BYPASS GRAFTING (CABG) X 2 ON CARDIOPULMONARY BYPASS. LIMA TO LAD, SVG TO DIAG;  Surgeon: Ivin Poot, MD;  Location: [place];  Service: Open Heart Surgery;  Laterality: N/A;   ENDOVEIN HARVEST OF GREATER SAPHENOUS VEIN Right 05/01/2020   Procedure: ENDOVEIN HARVEST OF GREATER SAPHENOUS VEIN;  Surgeon: Ivin Poot, MD;  Location: [place];  Service: Open Heart  Surgery;  Laterality: Right;   KNEE ARTHROSCOPY  9924,2683   left and right   LUMBAR LAMINECTOMY/DECOMPRESSION MICRODISCECTOMY Left 09/29/2017   Procedure: LEFT LUMBAR TWO - LUMBAR THREELAMINOTOMY/MICRODISCECTOMY;  Surgeon: Jovita Gamma, MD;  Location: [place];  Service: Neurosurgery;  Laterality: Left;  LEFT LUMBAR 2- LUMBAR 3 LAMINOTOMY/MICRODISCECTOMY   NASAL SINUS SURGERY  05/2015   RIGHT/LEFT HEART CATH AND CORONARY ANGIOGRAPHY N/A 04/10/2020   Procedure: RIGHT/LEFT HEART CATH AND CORONARY ANGIOGRAPHY;  Surgeon: Troy Sine, MD;  Location: [place] [facility];  Service: Cardiovascular;  Laterality: N/A;   TEE WITHOUT CARDIOVERSION N/A 05/01/2020   Procedure: TRANSESOPHAGEAL ECHOCARDIOGRAM (TEE);  Surgeon: Prescott Gum, Collier Salina, MD;  Location: [place];  Service: Open Heart Surgery;  Laterality: N/A;   TOTAL KNEE ARTHROPLASTY Right 06/24/2016   Procedure: TOTAL KNEE ARTHROPLASTY;  Surgeon: Garald Balding, MD;  Location: [place];  Service: Orthopedics;  Laterality: Right;   TOTAL KNEE ARTHROPLASTY Left 12/18/2020   Procedure: LEFT TOTAL KNEE ARTHROPLASTY;  Surgeon: Garald Balding, MD;  [facility];  Service: Orthopedics;  Laterality: Left;   TRIGGER FINGER RELEASE Left 11/10/2013   Procedure: LEFT INDEX A-1 PULLEY RELEASE;  Surgeon: Cammie Sickle, MD;  Location: [place];  Service:  Orthopedics;  Laterality: Left;   ULNAR NERVE TRANSPOSITION Left 11/10/2013   Procedure: ULNAR NERVE TRANSPOSITION;  Surgeon: Cammie Sickle, MD;  Location: [place];  Service: Orthopedics;  Laterality: Left;    There were no vitals filed for this visit.   Subjective Assessment - 01/10/21 1306     Subjective He relays about 6/10 pain in his left knee today    Pertinent History CABG, microdiscectomy L2-3, rt TKA, Lt carpal tunnel release, arthritis, CAD, HTN, sciatica,    Patient Stated Goals To better walking & some plumbing    Pain Onset 1 to 4 weeks ago                                [facility] Adult PT Treatment/Exercise - 01/10/21 0001       Knee/Hip Exercises: Stretches   Gastroc Stretch Both;2 reps;30 seconds      Knee/Hip Exercises: Aerobic   Recumbent Bike rocking first 2 minutes, then 8 min full revolutions after moving seat back      Knee/Hip Exercises: Machines for Strengthening   Total Gym Leg Press 100 lbs DL 3X10, Then Lt leg only 37# 2X10      Knee/Hip Exercises: Seated   Long Arc Quad Right;3 sets;10 reps    Long Arc Quad Weight 5 lbs.    Long Arc Quad Limitations 3 sec holds    Other Seated Knee/Hip Exercises seated SLR 2# 2X10 on Lt    Hamstring Curl Left;3 sets;10 reps    Hamstring Limitations green      Manual Therapy   Manual therapy comments Lt knee PROM to tolerance, manual hamstring stretching, patella mobs                      PT Short Term Goals - 01/08/21 1900       PT SHORT TERM GOAL #1   Title Patient is independent with initial HEP    Time 4    Period Weeks    Status New    Target Date 02/01/21      PT SHORT TERM GOAL #2   Title PROM left knee -5* extension to 100* flexion    Time 4    Period Weeks    Status New    Target Date 02/01/21               PT Long Term Goals - 01/08/21 1855       PT LONG TERM GOAL #1   Title FOTO >/= 53%    Time 8    Period Weeks    Status New    Target Date 03/01/21      PT LONG TERM GOAL #2   Title left knee AROM -5* seated to 95* flexion standing    Time 8    Period Weeks    Status New    Target Date 03/01/21      PT LONG TERM GOAL #3   Title left knee PROM 0 - 105    Time 8    Period Weeks    Status New    Target Date 03/01/21      PT LONG TERM GOAL #4   Title Patient ambulates 500' & negotiates ramps, curbs & stairs single rail without assistive device independently.    Time 8    Period Weeks    Status New    Target Date 03/01/21  PT LONG TERM GOAL #5   Title Patient demonstrates ability to perform  light plumbing activities including getting on/off floor and lifting / carrying 20# items.    Time 8    Period Weeks    Status New    Target Date 03/01/21      Additional Long Term Goals   Additional Long Term Goals Yes      PT LONG TERM GOAL #6   Title Patient reports left knee pain </= 2/10 with standing & gait.    Time 8    Period Weeks    Status New    Target Date 03/01/21                   Plan - 01/10/21 1355     Clinical Impression Statement His cardiologist messaged back that he has no cardiac restrictions to be concernd about with PT which is documented in the previous telephone encounter. HR stayed below 123 today with exercises and he had no symptoms and good overall exercise tolerance today.    Personal Factors and Comorbidities Comorbidity 3+;Fitness    Comorbidities CABG 05/01/2020, microdiscectomy L2-3, rt TKA, Lt carpal tunnel release, arthritis, CAD, HTN, sciatica    Examination-Activity Limitations Lift;Carry;Locomotion Level;Squat;Stairs;Stand    Examination-Participation Restrictions Community Activity    Stability/Clinical Decision Making Stable/Uncomplicated    Rehab Potential Good    PT Frequency 2x / week    PT Duration 8 weeks    PT Treatment/Interventions ADLs/Self Care Home Management;Cryotherapy;Electrical Stimulation;Moist Heat;Gait training;Stair training;Functional mobility training;Therapeutic activities;DME Instruction;Therapeutic exercise;Neuromuscular re-education;Balance training;Patient/family education;Manual techniques;Scar mobilization;Passive range of motion;Vasopneumatic Device;Joint Manipulations    PT Next Visit Plan Manual therapy & exercises to increase range & strength, update HEP as indicated    PT Home Exercise Plan Access Code: [email or web address]    Consulted and Agree with Plan of Care Patient             Patient will benefit from skilled therapeutic intervention in order to improve the following deficits and impairments:   Abnormal gait, Cardiopulmonary status limiting activity, Decreased activity tolerance, Decreased balance, Decreased coordination, Decreased endurance, Decreased knowledge of precautions, Decreased knowledge of use of DME, Decreased mobility, Decreased range of motion, Decreased skin integrity, Decreased scar mobility, Decreased strength, Difficulty walking, Increased edema, Impaired flexibility, Pain  Visit Diagnosis: Other abnormalities of gait and mobility  Muscle weakness (generalized)  Stiffness of left knee, not elsewhere classified  Acute pain of left knee  Localized edema     Problem List Patient Active Problem List   Diagnosis Date Noted   Total knee replacement status, left 12/18/2020   Adhesive capsulitis of right shoulder 09/26/2020   Carpal tunnel syndrome, bilateral upper limbs 09/26/2020   Bilateral hand pain 09/20/2020   Chronic left shoulder pain 08/23/2020   Right arm pain 08/23/2020   Cubital tunnel syndrome on left 08/23/2020   Left ankle sprain 07/12/2020   Visit for wound check 06/11/2020   Change or removal of surgical wound dressing 05/24/2020   Pulmonary nodule, left 05/23/2020   Cellulitis 05/23/2020   S/P CABG x 2 05/02/2020   Coronary artery disease 05/01/2020   Pulmonary nodule 1 cm or greater in diameter 04/26/2020   Statin intolerance 04/20/2020   Coronary atherosclerosis of native coronary artery 04/18/2020   Abnormal cardiovascular stress test    Nonrheumatic aortic valve stenosis    Exertional dyspnea    Stable angina pectoris ([facility])    Bicuspid aortic valve 04/06/2020   Primary osteoarthritis of left  knee 11/08/2019   Bilateral primary osteoarthritis of knee 09/28/2019   HNP (herniated nucleus pulposus), lumbar 09/29/2017   Right carotid bruit 06/10/2017   Paresthesia 06/10/2017   Left wrist pain 10/09/2016   Unilateral primary osteoarthritis, right knee 06/24/2016   S/P total knee replacement using cement, right 06/24/2016   Acute  pain of left knee 03/27/2016   Mixed hyperlipidemia 01/18/2007   HYPERTENSION 01/18/2007    Silvestre Mesi 01/10/2021, 1:57 PM  [facility] Physical Therapy [address] [place], [place], [address] Phone: [phone]   Fax:  [phone]  Name: Philip Winters MRN: [id number] Date of Birth: [birth year]

## 2021-01-14 ENCOUNTER — Ambulatory Visit (INDEPENDENT_AMBULATORY_CARE_PROVIDER_SITE_OTHER): Payer: BC Managed Care – PPO | Admitting: Physical Therapy

## 2021-01-14 ENCOUNTER — Encounter: Payer: Self-pay | Admitting: Physical Therapy

## 2021-01-14 ENCOUNTER — Other Ambulatory Visit: Payer: Self-pay

## 2021-01-14 DIAGNOSIS — R2689 Other abnormalities of gait and mobility: Secondary | ICD-10-CM | POA: Diagnosis not present

## 2021-01-14 DIAGNOSIS — M6281 Muscle weakness (generalized): Secondary | ICD-10-CM | POA: Diagnosis not present

## 2021-01-14 DIAGNOSIS — M25662 Stiffness of left knee, not elsewhere classified: Secondary | ICD-10-CM

## 2021-01-14 DIAGNOSIS — R600 Localized edema: Secondary | ICD-10-CM

## 2021-01-14 DIAGNOSIS — M25562 Pain in left knee: Secondary | ICD-10-CM | POA: Diagnosis not present

## 2021-01-14 DIAGNOSIS — R2681 Unsteadiness on feet: Secondary | ICD-10-CM

## 2021-01-14 NOTE — Therapy (Signed)
[place], [place], [address] Phone: [phone]   Fax:  [phone]  Physical Therapy Treatment  Patient Details  Name: Philip Winters MRN: [id number] Date of Birth: August 23, 1960 Referring Provider (PT): Joni Fears, MD   Encounter Date: 01/14/2021   PT End of Session - 01/14/21 1339     Visit Number 3    Number of Visits 16    Date for PT Re-Evaluation 03/01/21    Authorization Type BCBS    Authorization Time Period ded & oop max met, 30 visit combo PT/OT/chiro    Authorization - Visit Number 3    Authorization - Number of Visits 30    PT Start Time 1300    PT Stop Time 1350    PT Time Calculation (min) 50 min    Activity Tolerance Patient tolerated treatment well;No increased pain    Behavior During Therapy [facility] for tasks assessed/performed             Past Medical History:  Diagnosis Date   Allergy    Arthritis    Coronary artery disease    GERD (gastroesophageal reflux disease)    occ   Heart murmur    baby   High cholesterol    Hypertension    Pneumonia    Right carotid bruit 06/10/2017   Sciatica    Seasonal allergies    Sinus tachycardia    Spinal stenosis     Past Surgical History:  Procedure Laterality Date   CARDIAC CATHETERIZATION     CARPAL TUNNEL WITH CUBITAL TUNNEL Left 11/10/2013   Procedure: LEFT CARPAL TUNNEL RELEASE;  Surgeon: Cammie Sickle, MD;  Location: [place];  Service: Orthopedics;  Laterality: Left;   COLONOSCOPY     CORONARY ARTERY BYPASS GRAFT N/A 05/01/2020   Procedure: CORONARY ARTERY BYPASS GRAFTING (CABG) X 2 ON CARDIOPULMONARY BYPASS. LIMA TO LAD, SVG TO DIAG;  Surgeon: Ivin Poot, MD;  Location: [place];  Service: Open Heart Surgery;  Laterality: N/A;   ENDOVEIN HARVEST OF GREATER SAPHENOUS VEIN Right 05/01/2020   Procedure: ENDOVEIN HARVEST OF GREATER SAPHENOUS VEIN;  Surgeon: Ivin Poot, MD;  Location: [place];  Service: Open Heart  Surgery;  Laterality: Right;   KNEE ARTHROSCOPY  [id number]   left and right   LUMBAR LAMINECTOMY/DECOMPRESSION MICRODISCECTOMY Left 09/29/2017   Procedure: LEFT LUMBAR TWO - LUMBAR THREELAMINOTOMY/MICRODISCECTOMY;  Surgeon: Jovita Gamma, MD;  Location: [place];  Service: Neurosurgery;  Laterality: Left;  LEFT LUMBAR 2- LUMBAR 3 LAMINOTOMY/MICRODISCECTOMY   NASAL SINUS SURGERY  05/2015   RIGHT/LEFT HEART CATH AND CORONARY ANGIOGRAPHY N/A 04/10/2020   Procedure: RIGHT/LEFT HEART CATH AND CORONARY ANGIOGRAPHY;  Surgeon: Troy Sine, MD;  Location: [place] [facility];  Service: Cardiovascular;  Laterality: N/A;   TEE WITHOUT CARDIOVERSION N/A 05/01/2020   Procedure: TRANSESOPHAGEAL ECHOCARDIOGRAM (TEE);  Surgeon: Prescott Gum, Collier Salina, MD;  Location: [place];  Service: Open Heart Surgery;  Laterality: N/A;   TOTAL KNEE ARTHROPLASTY Right 06/24/2016   Procedure: TOTAL KNEE ARTHROPLASTY;  Surgeon: Garald Balding, MD;  Location: [place];  Service: Orthopedics;  Laterality: Right;   TOTAL KNEE ARTHROPLASTY Left 12/18/2020   Procedure: LEFT TOTAL KNEE ARTHROPLASTY;  Surgeon: Garald Balding, MD;  [facility];  Service: Orthopedics;  Laterality: Left;   TRIGGER FINGER RELEASE Left 11/10/2013   Procedure: LEFT INDEX A-1 PULLEY RELEASE;  Surgeon: Cammie Sickle, MD;  Location: [place];  Service:  Orthopedics;  Laterality: Left;   ULNAR NERVE TRANSPOSITION Left 11/10/2013   Procedure: ULNAR NERVE TRANSPOSITION;  Surgeon: Cammie Sickle, MD;  Location: [place];  Service: Orthopedics;  Laterality: Left;    There were no vitals filed for this visit.   Subjective Assessment - 01/14/21 1303     Subjective doing okay, rode to [place] for dinner and reports "that was a mistake." Also feels spinal stenosis is back, and has injection scheduled for 9/6.    Pertinent History CABG, microdiscectomy L2-3, rt TKA, Lt carpal tunnel release, arthritis, CAD, HTN, sciatica,     Patient Stated Goals To better walking & some plumbing    Currently in Pain? Yes    Pain Score 5     Pain Location Knee    Pain Orientation Left;Medial;Lateral    Pain Descriptors / Indicators Sharp;Aching    Pain Type Surgical pain;Chronic pain    Pain Onset 1 to 4 weeks ago    Pain Frequency Constant    Aggravating Factors  standing > 15-20 min    Pain Relieving Factors repositioning and rest                [facility] PT Assessment - 01/14/21 1333       Assessment   Medical Diagnosis Left Total Knee Arthroplasty  M08.676 (ICD-10-CM)    Referring Provider (PT) Joni Fears, MD    Onset Date/Surgical Date 12/18/20    Hand Dominance Right      AROM   Left Knee Extension -13   seated LAQ, -7 supine quad set   Left Knee Flexion 106   supine                          [facility] Adult PT Treatment/Exercise - 01/14/21 1305       Knee/Hip Exercises: Stretches   Knee: Self-Stretch Limitations LLE 10 x 10 sec hold; overpressure from RLE      Knee/Hip Exercises: Aerobic   Recumbent Bike seat 6 x 8 min; L2      Knee/Hip Exercises: Machines for Strengthening   Total Gym Leg Press 100 lbs DL 3X10, Then Lt leg only 37# 3X10      Knee/Hip Exercises: Seated   Long Arc Quad Right;3 sets;10 reps    Long Arc Quad Weight 5 lbs.    Long Arc Quad Limitations 3 sec holds      Manual Therapy   Manual Therapy Passive ROM    Passive ROM Lt knee flexion in sitting, instructed in patellar mobs as well                      PT Short Term Goals - 01/08/21 1900       PT SHORT TERM GOAL #1   Title Patient is independent with initial HEP    Time 4    Period Weeks    Status New    Target Date 02/01/21      PT SHORT TERM GOAL #2   Title PROM left knee -5* extension to 100* flexion    Time 4    Period Weeks    Status New    Target Date 02/01/21               PT Long Term Goals - 01/08/21 1855       PT LONG TERM GOAL #1   Title FOTO >/= 53%    Time  8  Period Weeks    Status New    Target Date 03/01/21      PT LONG TERM GOAL #2   Title left knee AROM -5* seated to 95* flexion standing    Time 8    Period Weeks    Status New    Target Date 03/01/21      PT LONG TERM GOAL #3   Title left knee PROM 0 - 105    Time 8    Period Weeks    Status New    Target Date 03/01/21      PT LONG TERM GOAL #4   Title Patient ambulates 500' & negotiates ramps, curbs & stairs single rail without assistive device independently.    Time 8    Period Weeks    Status New    Target Date 03/01/21      PT LONG TERM GOAL #5   Title Patient demonstrates ability to perform light plumbing activities including getting on/off floor and lifting / carrying 20# items.    Time 8    Period Weeks    Status New    Target Date 03/01/21      Additional Long Term Goals   Additional Long Term Goals Yes      PT LONG TERM GOAL #6   Title Patient reports left knee pain </= 2/10 with standing & gait.    Time 8    Period Weeks    Status New    Target Date 03/01/21                   Plan - 01/14/21 1339     Clinical Impression Statement Pt demonstrating steady progress towards goals and ROM at this time.  Overall progressing well with PT and will continue to benefit from PT to maximize function.    Personal Factors and Comorbidities Comorbidity 3+;Fitness    Comorbidities CABG 05/01/2020, microdiscectomy L2-3, rt TKA, Lt carpal tunnel release, arthritis, CAD, HTN, sciatica    Examination-Activity Limitations Lift;Carry;Locomotion Level;Squat;Stairs;Stand    Examination-Participation Restrictions Community Activity    Stability/Clinical Decision Making Stable/Uncomplicated    Rehab Potential Good    PT Frequency 2x / week    PT Duration 8 weeks    PT Treatment/Interventions ADLs/Self Care Home Management;Cryotherapy;Electrical Stimulation;Moist Heat;Gait training;Stair training;Functional mobility training;Therapeutic activities;DME  Instruction;Therapeutic exercise;Neuromuscular re-education;Balance training;Patient/family education;Manual techniques;Scar mobilization;Passive range of motion;Vasopneumatic Device;Joint Manipulations    PT Next Visit Plan Manual therapy & exercises to increase range & strength, update HEP as indicated, vaso for swelling    PT Home Exercise Plan Access Code: [email or web address]    Consulted and Agree with Plan of Care Patient             Patient will benefit from skilled therapeutic intervention in order to improve the following deficits and impairments:  Abnormal gait, Cardiopulmonary status limiting activity, Decreased activity tolerance, Decreased balance, Decreased coordination, Decreased endurance, Decreased knowledge of precautions, Decreased knowledge of use of DME, Decreased mobility, Decreased range of motion, Decreased skin integrity, Decreased scar mobility, Decreased strength, Difficulty walking, Increased edema, Impaired flexibility, Pain  Visit Diagnosis: Other abnormalities of gait and mobility  Muscle weakness (generalized)  Stiffness of left knee, not elsewhere classified  Acute pain of left knee  Localized edema  Unsteadiness on feet     Problem List Patient Active Problem List   Diagnosis Date Noted   Total knee replacement status, left 12/18/2020   Adhesive capsulitis of right shoulder 09/26/2020   Carpal tunnel  syndrome, bilateral upper limbs 09/26/2020   Bilateral hand pain 09/20/2020   Chronic left shoulder pain 08/23/2020   Right arm pain 08/23/2020   Cubital tunnel syndrome on left 08/23/2020   Left ankle sprain 07/12/2020   Visit for wound check 06/11/2020   Change or removal of surgical wound dressing 05/24/2020   Pulmonary nodule, left 05/23/2020   Cellulitis 05/23/2020   S/P CABG x 2 05/02/2020   Coronary artery disease 05/01/2020   Pulmonary nodule 1 cm or greater in diameter 04/26/2020   Statin intolerance 04/20/2020   Coronary atherosclerosis of  native coronary artery 04/18/2020   Abnormal cardiovascular stress test    Nonrheumatic aortic valve stenosis    Exertional dyspnea    Stable angina pectoris ([place])    Bicuspid aortic valve 04/06/2020   Primary osteoarthritis of left knee 11/08/2019   Bilateral primary osteoarthritis of knee 09/28/2019   HNP (herniated nucleus pulposus), lumbar 09/29/2017   Right carotid bruit 06/10/2017   Paresthesia 06/10/2017   Left wrist pain 10/09/2016   Unilateral primary osteoarthritis, right knee 06/24/2016   S/P total knee replacement using cement, right 06/24/2016   Acute pain of left knee 03/27/2016   Mixed hyperlipidemia 01/18/2007   HYPERTENSION 01/18/2007     Laureen Abrahams, PT, DPT 01/14/21 1:41 PM   [facility] [address] [place], [place], [address] Phone: [phone]   Fax:  [phone]  Name: KEYEN MARBAN MRN: [id number] Date of Birth: 07-16-1960

## 2021-01-17 ENCOUNTER — Ambulatory Visit (INDEPENDENT_AMBULATORY_CARE_PROVIDER_SITE_OTHER): Payer: BC Managed Care – PPO | Admitting: Rehabilitative and Restorative Service Providers"

## 2021-01-17 ENCOUNTER — Encounter: Payer: Self-pay | Admitting: Rehabilitative and Restorative Service Providers"

## 2021-01-17 ENCOUNTER — Other Ambulatory Visit: Payer: Self-pay

## 2021-01-17 ENCOUNTER — Telehealth: Payer: Self-pay

## 2021-01-17 DIAGNOSIS — R2689 Other abnormalities of gait and mobility: Secondary | ICD-10-CM | POA: Diagnosis not present

## 2021-01-17 DIAGNOSIS — R600 Localized edema: Secondary | ICD-10-CM

## 2021-01-17 DIAGNOSIS — M6281 Muscle weakness (generalized): Secondary | ICD-10-CM

## 2021-01-17 DIAGNOSIS — M25562 Pain in left knee: Secondary | ICD-10-CM

## 2021-01-17 DIAGNOSIS — M25662 Stiffness of left knee, not elsewhere classified: Secondary | ICD-10-CM | POA: Diagnosis not present

## 2021-01-17 NOTE — Telephone Encounter (Signed)
Spoke with patient and relayed information to patient.

## 2021-01-17 NOTE — Therapy (Signed)
[place], [place], [address] Phone: [phone]   Fax:  [phone]  Physical Therapy Treatment  Patient Details  Name: Philip Winters MRN: [id number] Date of Birth: 04-14-1961 Referring Provider (PT): Joni Fears, MD   Encounter Date: 01/17/2021   PT End of Session - 01/17/21 1657     Visit Number 4    Number of Visits 16    Date for PT Re-Evaluation 03/01/21    Authorization Type BCBS    Authorization Time Period ded & oop max met, 30 visit combo PT/OT/chiro    Authorization - Visit Number 4    Authorization - Number of Visits 30    PT Start Time 1345    PT Stop Time 1440    PT Time Calculation (min) 55 min    Activity Tolerance Patient tolerated treatment well;No increased pain    Behavior During Therapy [facility] for tasks assessed/performed             Past Medical History:  Diagnosis Date   Allergy    Arthritis    Coronary artery disease    GERD (gastroesophageal reflux disease)    occ   Heart murmur    baby   High cholesterol    Hypertension    Pneumonia    Right carotid bruit 06/10/2017   Sciatica    Seasonal allergies    Sinus tachycardia    Spinal stenosis     Past Surgical History:  Procedure Laterality Date   CARDIAC CATHETERIZATION     CARPAL TUNNEL WITH CUBITAL TUNNEL Left 11/10/2013   Procedure: LEFT CARPAL TUNNEL RELEASE;  Surgeon: Cammie Sickle, MD;  Location: [place];  Service: Orthopedics;  Laterality: Left;   COLONOSCOPY     CORONARY ARTERY BYPASS GRAFT N/A 05/01/2020   Procedure: CORONARY ARTERY BYPASS GRAFTING (CABG) X 2 ON CARDIOPULMONARY BYPASS. LIMA TO LAD, SVG TO DIAG;  Surgeon: Ivin Poot, MD;  Location: [place];  Service: Open Heart Surgery;  Laterality: N/A;   ENDOVEIN HARVEST OF GREATER SAPHENOUS VEIN Right 05/01/2020   Procedure: ENDOVEIN HARVEST OF GREATER SAPHENOUS VEIN;  Surgeon: Ivin Poot, MD;  Location: [place];  Service: Open Heart  Surgery;  Laterality: Right;   KNEE ARTHROSCOPY  [id number]   left and right   LUMBAR LAMINECTOMY/DECOMPRESSION MICRODISCECTOMY Left 09/29/2017   Procedure: LEFT LUMBAR TWO - LUMBAR THREELAMINOTOMY/MICRODISCECTOMY;  Surgeon: Jovita Gamma, MD;  Location: [place];  Service: Neurosurgery;  Laterality: Left;  LEFT LUMBAR 2- LUMBAR 3 LAMINOTOMY/MICRODISCECTOMY   NASAL SINUS SURGERY  05/2015   RIGHT/LEFT HEART CATH AND CORONARY ANGIOGRAPHY N/A 04/10/2020   Procedure: RIGHT/LEFT HEART CATH AND CORONARY ANGIOGRAPHY;  Surgeon: Troy Sine, MD;  Location: [place] [facility];  Service: Cardiovascular;  Laterality: N/A;   TEE WITHOUT CARDIOVERSION N/A 05/01/2020   Procedure: TRANSESOPHAGEAL ECHOCARDIOGRAM (TEE);  Surgeon: Prescott Gum, Collier Salina, MD;  Location: [place];  Service: Open Heart Surgery;  Laterality: N/A;   TOTAL KNEE ARTHROPLASTY Right 06/24/2016   Procedure: TOTAL KNEE ARTHROPLASTY;  Surgeon: Garald Balding, MD;  Location: [place];  Service: Orthopedics;  Laterality: Right;   TOTAL KNEE ARTHROPLASTY Left 12/18/2020   Procedure: LEFT TOTAL KNEE ARTHROPLASTY;  Surgeon: Garald Balding, MD;  [facility];  Service: Orthopedics;  Laterality: Left;   TRIGGER FINGER RELEASE Left 11/10/2013   Procedure: LEFT INDEX A-1 PULLEY RELEASE;  Surgeon: Cammie Sickle, MD;  Location: [place];  Service:  Orthopedics;  Laterality: Left;   ULNAR NERVE TRANSPOSITION Left 11/10/2013   Procedure: ULNAR NERVE TRANSPOSITION;  Surgeon: Cammie Sickle, MD;  Location: [place];  Service: Orthopedics;  Laterality: Left;    There were no vitals filed for this visit.   Subjective Assessment - 01/17/21 1355     Subjective [name] is getting 3-4 hours of uninterrupted sleep.  He is using ice and using tylenol for pain.    Pertinent History CABG, microdiscectomy L2-3, rt TKA, Lt carpal tunnel release, arthritis, CAD, HTN, sciatica,    Patient Stated Goals To better walking & some  plumbing    Currently in Pain? Yes    Pain Score 6     Pain Location Knee    Pain Orientation Left    Pain Descriptors / Indicators Tightness;Stabbing    Pain Type Chronic pain;Surgical pain    Pain Radiating Towards NA    Pain Onset 1 to 4 weeks ago    Pain Frequency Constant    Aggravating Factors  Prolonged WB or postures    Pain Relieving Factors Change of position, ice and exercises    Effect of Pain on Daily Activities Not back to work and edema limits WB    Multiple Pain Sites No                [facility] PT Assessment - 01/17/21 0001       ROM / Strength   AROM / PROM / Strength AROM      AROM   Overall AROM  Deficits    AROM Assessment Site Knee    Right/Left Knee Left    Left Knee Extension -7    Left Knee Flexion 105                           [facility] Adult PT Treatment/Exercise - 01/17/21 0001       Exercises   Exercises Knee/Hip      Knee/Hip Exercises: Aerobic   Recumbent Bike Seat 6 for 8 minutes Lelel 3      Knee/Hip Exercises: Machines for Strengthening   Total Gym Leg Press 100# 2 sets of 15 slow eccentrics, emphasis on push into full extension and flexion AROM      Knee/Hip Exercises: Seated   Other Seated Knee/Hip Exercises Tailgate knee flexion 1 minute followed by AAROM (R pushes L into flexion) 10X 10 seconds      Knee/Hip Exercises: Supine   Quad Sets Strengthening;Both;2 sets;10 reps;Limitations    Quad Sets Limitations 5 seconds      Modalities   Modalities Vasopneumatic      Vasopneumatic   Number Minutes Vasopneumatic  10 minutes    Vasopnuematic Location  Knee    Vasopneumatic Pressure Medium    Vasopneumatic Temperature  34*                    PT Education - 01/17/21 1655     Education Details Encouraged emphasis on quad sets, seated straight leg raises and knee flexion AROM with HEP to reduce edema, strengthen quadriceps and improve AROM.    Person(s) Educated Patient    Methods  Explanation;Demonstration;Tactile cues;Verbal cues;Handout    Comprehension Verbal cues required;Need further instruction;Returned demonstration;Verbalized understanding;Tactile cues required              PT Short Term Goals - 01/17/21 1657       PT SHORT TERM GOAL #1  Title Patient is independent with initial HEP    Time 4    Period Weeks    Status Achieved    Target Date 02/01/21      PT SHORT TERM GOAL #2   Title PROM left knee -5* extension to 100* flexion    Baseline -7 to 105 on 01/17/2021    Time 4    Period Weeks    Status Partially Met    Target Date 02/01/21               PT Long Term Goals - 01/08/21 1855       PT LONG TERM GOAL #1   Title FOTO >/= 53%    Time 8    Period Weeks    Status New    Target Date 03/01/21      PT LONG TERM GOAL #2   Title left knee AROM -5* seated to 95* flexion standing    Time 8    Period Weeks    Status New    Target Date 03/01/21      PT LONG TERM GOAL #3   Title left knee PROM 0 - 105    Time 8    Period Weeks    Status New    Target Date 03/01/21      PT LONG TERM GOAL #4   Title Patient ambulates 500' & negotiates ramps, curbs & stairs single rail without assistive device independently.    Time 8    Period Weeks    Status New    Target Date 03/01/21      PT LONG TERM GOAL #5   Title Patient demonstrates ability to perform light plumbing activities including getting on/off floor and lifting / carrying 20# items.    Time 8    Period Weeks    Status New    Target Date 03/01/21      Additional Long Term Goals   Additional Long Term Goals Yes      PT LONG TERM GOAL #6   Title Patient reports left knee pain </= 2/10 with standing & gait.    Time 8    Period Weeks    Status New    Target Date 03/01/21                   Plan - 01/17/21 1658     Clinical Impression Statement Raykwon is doing a great job with his HEP.  AROM is significantly improved as compared to evaluation (see objective).   He does appear to be overdoing WB as he is trying not to use an assistive device only (<) 1 month post-surgery.  Swelling late in the day would benefit from using the walker when walking longer distances and the cane in the house.  This was discussed with Luvenia Heller and he said he will try this.  Continue emphasis on edema reduction, AROM and quadriceps strength < 1 month post-TKA.    Personal Factors and Comorbidities Comorbidity 3+;Fitness    Comorbidities CABG 05/01/2020, microdiscectomy L2-3, rt TKA, Lt carpal tunnel release, arthritis, CAD, HTN, sciatica    Examination-Activity Limitations Lift;Carry;Locomotion Level;Squat;Stairs;Stand    Examination-Participation Restrictions Community Activity    Stability/Clinical Decision Making Stable/Uncomplicated    Rehab Potential Good    PT Frequency 2x / week    PT Duration 8 weeks    PT Treatment/Interventions ADLs/Self Care Home Management;Cryotherapy;Electrical Stimulation;Moist Heat;Gait training;Stair training;Functional mobility training;Therapeutic activities;DME Instruction;Therapeutic exercise;Neuromuscular re-education;Balance training;Patient/family education;Manual techniques;Scar mobilization;Passive range of  motion;Vasopneumatic Device;Joint Manipulations    PT Next Visit Plan Control edema, quadriceps strength, AROM    PT Home Exercise Plan Access Code: [email or web address]    Consulted and Agree with Plan of Care Patient             Patient will benefit from skilled therapeutic intervention in order to improve the following deficits and impairments:  Abnormal gait, Cardiopulmonary status limiting activity, Decreased activity tolerance, Decreased balance, Decreased coordination, Decreased endurance, Decreased knowledge of precautions, Decreased knowledge of use of DME, Decreased mobility, Decreased range of motion, Decreased skin integrity, Decreased scar mobility, Decreased strength, Difficulty walking, Increased edema, Impaired flexibility,  Pain  Visit Diagnosis: Other abnormalities of gait and mobility  Muscle weakness (generalized)  Stiffness of left knee, not elsewhere classified  Acute pain of left knee  Localized edema     Problem List Patient Active Problem List   Diagnosis Date Noted   Total knee replacement status, left 12/18/2020   Adhesive capsulitis of right shoulder 09/26/2020   Carpal tunnel syndrome, bilateral upper limbs 09/26/2020   Bilateral hand pain 09/20/2020   Chronic left shoulder pain 08/23/2020   Right arm pain 08/23/2020   Cubital tunnel syndrome on left 08/23/2020   Left ankle sprain 07/12/2020   Visit for wound check 06/11/2020   Change or removal of surgical wound dressing 05/24/2020   Pulmonary nodule, left 05/23/2020   Cellulitis 05/23/2020   S/P CABG x 2 05/02/2020   Coronary artery disease 05/01/2020   Pulmonary nodule 1 cm or greater in diameter 04/26/2020   Statin intolerance 04/20/2020   Coronary atherosclerosis of native coronary artery 04/18/2020   Abnormal cardiovascular stress test    Nonrheumatic aortic valve stenosis    Exertional dyspnea    Stable angina pectoris ([facility])    Bicuspid aortic valve 04/06/2020   Primary osteoarthritis of left knee 11/08/2019   Bilateral primary osteoarthritis of knee 09/28/2019   HNP (herniated nucleus pulposus), lumbar 09/29/2017   Right carotid bruit 06/10/2017   Paresthesia 06/10/2017   Left wrist pain 10/09/2016   Unilateral primary osteoarthritis, right knee 06/24/2016   S/P total knee replacement using cement, right 06/24/2016   Acute pain of left knee 03/27/2016   Mixed hyperlipidemia 01/18/2007   HYPERTENSION 01/18/2007    Farley Ly PT, MPT 01/17/2021, 5:02 PM  [facility] [address] [place], [place], [address] Phone: [phone]   Fax:  [phone]  Name: VERTIS SCHEIB MRN: [id number] Date of Birth: January 17, 1961

## 2021-01-17 NOTE — Telephone Encounter (Signed)
Pt came into the office wanted to let Dr. Durward Fortes know that he has a back injection scheduled for the 6th. He wanted to know if Dr. Durward Fortes was okay with that or did he need to push it back ?   Please advise

## 2021-01-17 NOTE — Patient Instructions (Signed)
Access Code: [email or web address] URL: [email or web address] Date: 01/17/2021 Prepared by: Vista Mink  Exercises Quad Setting and Stretching - 2-4 x daily - 7 x weekly - 5-10 sets - 10 reps - prop 5-10 minutes & quad set5 seconds hold Supine Heel Slide with Strap - 2-3 x daily - 7 x weekly - 2-3 sets - 10 reps - 5 seconds hold Supine Straight Leg Raises - 2-3 x daily - 7 x weekly - 2-3 sets - 10 reps - 5 seconds hold Seated Knee Flexion Extension AROM - 2-4 x daily - 7 x weekly - 2-3 sets - 10 reps - 5 seconds hold Seated straight leg lifts - 2-3 x daily - 7 x weekly - 2-3 sets - 10 reps - 5 seconds hold Seated Hamstring Stretch with Strap - 2-4 x daily - 7 x weekly - 1 sets - 3 reps - 20-30 seconds hold Standing Gastroc Stretch - 2-4 x daily - 7 x weekly - 1 sets - 3 reps - 20-30 seconds hold Supine Quadricep Sets - 2-3 x daily - 7 x weekly - 2-3 sets - 10 reps - 5 second hold Small Range Straight Leg Raise - 1-2 x daily - 7 x weekly - 4-10 sets - 5 reps - 3 seconds hold

## 2021-01-17 NOTE — Telephone Encounter (Signed)
Ok with hip injection

## 2021-01-22 DIAGNOSIS — M48062 Spinal stenosis, lumbar region with neurogenic claudication: Secondary | ICD-10-CM | POA: Diagnosis not present

## 2021-01-24 ENCOUNTER — Other Ambulatory Visit: Payer: Self-pay

## 2021-01-24 ENCOUNTER — Ambulatory Visit (INDEPENDENT_AMBULATORY_CARE_PROVIDER_SITE_OTHER): Payer: BC Managed Care – PPO | Admitting: Physical Therapy

## 2021-01-24 DIAGNOSIS — M6281 Muscle weakness (generalized): Secondary | ICD-10-CM | POA: Diagnosis not present

## 2021-01-24 DIAGNOSIS — R2681 Unsteadiness on feet: Secondary | ICD-10-CM

## 2021-01-24 DIAGNOSIS — R2689 Other abnormalities of gait and mobility: Secondary | ICD-10-CM | POA: Diagnosis not present

## 2021-01-24 DIAGNOSIS — M25662 Stiffness of left knee, not elsewhere classified: Secondary | ICD-10-CM | POA: Diagnosis not present

## 2021-01-24 DIAGNOSIS — M25562 Pain in left knee: Secondary | ICD-10-CM

## 2021-01-24 DIAGNOSIS — R600 Localized edema: Secondary | ICD-10-CM

## 2021-01-24 NOTE — Therapy (Signed)
[place], [place], [address] Phone: [phone]   Fax:  [phone]  Physical Therapy Treatment  Patient Details  Name: Philip Winters MRN: [id number] Date of Birth: [birth year] Referring Provider (PT): Joni Fears, MD   Encounter Date: 01/24/2021   PT End of Session - 01/24/21 1509     Visit Number 5    Number of Visits 16    Date for PT Re-Evaluation 03/01/21    Authorization Type BCBS    Authorization Time Period ded & oop max met, 30 visit combo PT/OT/chiro    Authorization - Visit Number 5    Authorization - Number of Visits 30    PT Start Time 1430    PT Stop Time 1510    PT Time Calculation (min) 40 min    Activity Tolerance Patient tolerated treatment well;No increased pain    Behavior During Therapy [facility] for tasks assessed/performed             Past Medical History:  Diagnosis Date   Allergy    Arthritis    Coronary artery disease    GERD (gastroesophageal reflux disease)    occ   Heart murmur    baby   High cholesterol    Hypertension    Pneumonia    Right carotid bruit 06/10/2017   Sciatica    Seasonal allergies    Sinus tachycardia    Spinal stenosis     Past Surgical History:  Procedure Laterality Date   CARDIAC CATHETERIZATION     CARPAL TUNNEL WITH CUBITAL TUNNEL Left 11/10/2013   Procedure: LEFT CARPAL TUNNEL RELEASE;  Surgeon: Cammie Sickle, MD;  Location: [place];  Service: Orthopedics;  Laterality: Left;   COLONOSCOPY     CORONARY ARTERY BYPASS GRAFT N/A 05/01/2020   Procedure: CORONARY ARTERY BYPASS GRAFTING (CABG) X 2 ON CARDIOPULMONARY BYPASS. LIMA TO LAD, SVG TO DIAG;  Surgeon: Ivin Poot, MD;  Location: [place];  Service: Open Heart Surgery;  Laterality: N/A;   ENDOVEIN HARVEST OF GREATER SAPHENOUS VEIN Right 05/01/2020   Procedure: ENDOVEIN HARVEST OF GREATER SAPHENOUS VEIN;  Surgeon: Ivin Poot, MD;  Location: [place];  Service: Open Heart  Surgery;  Laterality: Right;   KNEE ARTHROSCOPY  [id number]   left and right   LUMBAR LAMINECTOMY/DECOMPRESSION MICRODISCECTOMY Left 09/29/2017   Procedure: LEFT LUMBAR TWO - LUMBAR THREELAMINOTOMY/MICRODISCECTOMY;  Surgeon: Jovita Gamma, MD;  Location: [place];  Service: Neurosurgery;  Laterality: Left;  LEFT LUMBAR 2- LUMBAR 3 LAMINOTOMY/MICRODISCECTOMY   NASAL SINUS SURGERY  05/2015   RIGHT/LEFT HEART CATH AND CORONARY ANGIOGRAPHY N/A 04/10/2020   Procedure: RIGHT/LEFT HEART CATH AND CORONARY ANGIOGRAPHY;  Surgeon: Troy Sine, MD;  Location: [place] [facility];  Service: Cardiovascular;  Laterality: N/A;   TEE WITHOUT CARDIOVERSION N/A 05/01/2020   Procedure: TRANSESOPHAGEAL ECHOCARDIOGRAM (TEE);  Surgeon: Prescott Gum, Collier Salina, MD;  Location: [place];  Service: Open Heart Surgery;  Laterality: N/A;   TOTAL KNEE ARTHROPLASTY Right 06/24/2016   Procedure: TOTAL KNEE ARTHROPLASTY;  Surgeon: Garald Balding, MD;  Location: [place];  Service: Orthopedics;  Laterality: Right;   TOTAL KNEE ARTHROPLASTY Left 12/18/2020   Procedure: LEFT TOTAL KNEE ARTHROPLASTY;  Surgeon: Garald Balding, MD;  [facility];  Service: Orthopedics;  Laterality: Left;   TRIGGER FINGER RELEASE Left 11/10/2013   Procedure: LEFT INDEX A-1 PULLEY RELEASE;  Surgeon: Cammie Sickle, MD;  Location: [place];  Service:  Orthopedics;  Laterality: Left;   ULNAR NERVE TRANSPOSITION Left 11/10/2013   Procedure: ULNAR NERVE TRANSPOSITION;  Surgeon: Cammie Sickle, MD;  Location: [place];  Service: Orthopedics;  Laterality: Left;    There were no vitals filed for this visit.   Subjective Assessment - 01/24/21 1432     Subjective had spinal injection on Tuesday so hasn't done much exercise since then.    Pertinent History CABG, microdiscectomy L2-3, rt TKA, Lt carpal tunnel release, arthritis, CAD, HTN, sciatica,    Patient Stated Goals To better walking & some plumbing    Currently in  Pain? No/denies                               [facility] Adult PT Treatment/Exercise - 01/24/21 1433       Knee/Hip Exercises: Aerobic   Recumbent Bike Seat 6 for 8 minutes Level 3      Knee/Hip Exercises: Machines for Strengthening   Cybex Knee Extension 5# LLE only 3x10    Cybex Knee Flexion 15# 3x10 LLE only    Total Gym Leg Press 125# bil push 3x10; LLE only 50# 3x10      Knee/Hip Exercises: Standing   Forward Step Up Left;2 sets;10 reps;Hand Hold: 1;Step Height: 6"    Functional Squat 2 sets;10 reps;3 seconds    Functional Squat Limitations TRX    Other Standing Knee Exercises LLE on 4" step with RLE x 20 reps    Other Standing Knee Exercises LLE SL deadlift x8 reps with 5# KB                       PT Short Term Goals - 01/17/21 1657       PT SHORT TERM GOAL #1   Title Patient is independent with initial HEP    Time 4    Period Weeks    Status Achieved    Target Date 02/01/21      PT SHORT TERM GOAL #2   Title PROM left knee -5* extension to 100* flexion    Baseline -7 to 105 on 01/17/2021    Time 4    Period Weeks    Status Partially Met    Target Date 02/01/21               PT Long Term Goals - 01/08/21 1855       PT LONG TERM GOAL #1   Title FOTO >/= 53%    Time 8    Period Weeks    Status New    Target Date 03/01/21      PT LONG TERM GOAL #2   Title left knee AROM -5* seated to 95* flexion standing    Time 8    Period Weeks    Status New    Target Date 03/01/21      PT LONG TERM GOAL #3   Title left knee PROM 0 - 105    Time 8    Period Weeks    Status New    Target Date 03/01/21      PT LONG TERM GOAL #4   Title Patient ambulates 500' & negotiates ramps, curbs & stairs single rail without assistive device independently.    Time 8    Period Weeks    Status New    Target Date 03/01/21      PT LONG TERM GOAL #5  Title Patient demonstrates ability to perform light plumbing activities including getting  on/off floor and lifting / carrying 20# items.    Time 8    Period Weeks    Status New    Target Date 03/01/21      Additional Long Term Goals   Additional Long Term Goals Yes      PT LONG TERM GOAL #6   Title Patient reports left knee pain </= 2/10 with standing & gait.    Time 8    Period Weeks    Status New    Target Date 03/01/21                   Plan - 01/24/21 1510     Clinical Impression Statement Pt continues to demonstrated decreased quad control, especially with TKE activities.  Overall continues to progress well with PT.  Will continue to benefit from PT to maximize function.    Personal Factors and Comorbidities Comorbidity 3+;Fitness    Comorbidities CABG 05/01/2020, microdiscectomy L2-3, rt TKA, Lt carpal tunnel release, arthritis, CAD, HTN, sciatica    Examination-Activity Limitations Lift;Carry;Locomotion Level;Squat;Stairs;Stand    Examination-Participation Restrictions Community Activity    Stability/Clinical Decision Making Stable/Uncomplicated    Rehab Potential Good    PT Frequency 2x / week    PT Duration 8 weeks    PT Treatment/Interventions ADLs/Self Care Home Management;Cryotherapy;Electrical Stimulation;Moist Heat;Gait training;Stair training;Functional mobility training;Therapeutic activities;DME Instruction;Therapeutic exercise;Neuromuscular re-education;Balance training;Patient/family education;Manual techniques;Scar mobilization;Passive range of motion;Vasopneumatic Device;Joint Manipulations    PT Next Visit Plan Control edema, quadriceps strength/TKE, AROM    PT Home Exercise Plan Access Code: [email or web address]    Consulted and Agree with Plan of Care Patient             Patient will benefit from skilled therapeutic intervention in order to improve the following deficits and impairments:  Abnormal gait, Cardiopulmonary status limiting activity, Decreased activity tolerance, Decreased balance, Decreased coordination, Decreased endurance,  Decreased knowledge of precautions, Decreased knowledge of use of DME, Decreased mobility, Decreased range of motion, Decreased skin integrity, Decreased scar mobility, Decreased strength, Difficulty walking, Increased edema, Impaired flexibility, Pain  Visit Diagnosis: Other abnormalities of gait and mobility  Muscle weakness (generalized)  Stiffness of left knee, not elsewhere classified  Acute pain of left knee  Localized edema  Unsteadiness on feet     Problem List Patient Active Problem List   Diagnosis Date Noted   Total knee replacement status, left 12/18/2020   Adhesive capsulitis of right shoulder 09/26/2020   Carpal tunnel syndrome, bilateral upper limbs 09/26/2020   Bilateral hand pain 09/20/2020   Chronic left shoulder pain 08/23/2020   Right arm pain 08/23/2020   Cubital tunnel syndrome on left 08/23/2020   Left ankle sprain 07/12/2020   Visit for wound check 06/11/2020   Change or removal of surgical wound dressing 05/24/2020   Pulmonary nodule, left 05/23/2020   Cellulitis 05/23/2020   S/P CABG x 2 05/02/2020   Coronary artery disease 05/01/2020   Pulmonary nodule 1 cm or greater in diameter 04/26/2020   Statin intolerance 04/20/2020   Coronary atherosclerosis of native coronary artery 04/18/2020   Abnormal cardiovascular stress test    Nonrheumatic aortic valve stenosis    Exertional dyspnea    Stable angina pectoris (HCC)    Bicuspid aortic valve 04/06/2020   Primary osteoarthritis of left knee 11/08/2019   Bilateral primary osteoarthritis of knee 09/28/2019   HNP (herniated nucleus pulposus), lumbar 09/29/2017   Right carotid bruit 06/10/2017  Paresthesia 06/10/2017   Left wrist pain 10/09/2016   Unilateral primary osteoarthritis, right knee 06/24/2016   S/P total knee replacement using cement, right 06/24/2016   Acute pain of left knee 03/27/2016   Mixed hyperlipidemia 01/18/2007   HYPERTENSION 01/18/2007      Laureen Abrahams, PT,  DPT 01/24/21 3:12 PM     [facility] [address] [place], [place], [address] Phone: [phone]   Fax:  [phone]  Name: Philip Winters MRN: [id number] Date of Birth: February 06, 1961

## 2021-01-28 ENCOUNTER — Other Ambulatory Visit: Payer: Self-pay

## 2021-01-28 ENCOUNTER — Ambulatory Visit (INDEPENDENT_AMBULATORY_CARE_PROVIDER_SITE_OTHER): Payer: BC Managed Care – PPO | Admitting: Physical Therapy

## 2021-01-28 ENCOUNTER — Encounter: Payer: Self-pay | Admitting: Physical Therapy

## 2021-01-28 DIAGNOSIS — M6281 Muscle weakness (generalized): Secondary | ICD-10-CM | POA: Diagnosis not present

## 2021-01-28 DIAGNOSIS — M25562 Pain in left knee: Secondary | ICD-10-CM | POA: Diagnosis not present

## 2021-01-28 DIAGNOSIS — R600 Localized edema: Secondary | ICD-10-CM

## 2021-01-28 DIAGNOSIS — M25662 Stiffness of left knee, not elsewhere classified: Secondary | ICD-10-CM | POA: Diagnosis not present

## 2021-01-28 DIAGNOSIS — R2681 Unsteadiness on feet: Secondary | ICD-10-CM

## 2021-01-28 DIAGNOSIS — R2689 Other abnormalities of gait and mobility: Secondary | ICD-10-CM

## 2021-01-28 NOTE — Therapy (Signed)
[place], [place], [address] Phone: [phone]   Fax:  [phone]  Physical Therapy Treatment  Patient Details  Name: Philip Winters MRN: [id number] Date of Birth: [birth year] Referring Provider (PT): Joni Fears, MD   Encounter Date: 01/28/2021   PT End of Session - 01/28/21 1508     Visit Number 6    Number of Visits 16    Date for PT Re-Evaluation 03/01/21    Authorization Type BCBS    Authorization Time Period ded & oop max met, 30 visit combo PT/OT/chiro    Authorization - Visit Number 6    Authorization - Number of Visits 30    PT Start Time 7408    PT Stop Time 1508    PT Time Calculation (min) 40 min    Activity Tolerance Patient tolerated treatment well;No increased pain    Behavior During Therapy [facility] for tasks assessed/performed             Past Medical History:  Diagnosis Date   Allergy    Arthritis    Coronary artery disease    GERD (gastroesophageal reflux disease)    occ   Heart murmur    baby   High cholesterol    Hypertension    Pneumonia    Right carotid bruit 06/10/2017   Sciatica    Seasonal allergies    Sinus tachycardia    Spinal stenosis     Past Surgical History:  Procedure Laterality Date   CARDIAC CATHETERIZATION     CARPAL TUNNEL WITH CUBITAL TUNNEL Left 11/10/2013   Procedure: LEFT CARPAL TUNNEL RELEASE;  Surgeon: Cammie Sickle, MD;  Location: [place];  Service: Orthopedics;  Laterality: Left;   COLONOSCOPY     CORONARY ARTERY BYPASS GRAFT N/A 05/01/2020   Procedure: CORONARY ARTERY BYPASS GRAFTING (CABG) X 2 ON CARDIOPULMONARY BYPASS. LIMA TO LAD, SVG TO DIAG;  Surgeon: Ivin Poot, MD;  Location: [place];  Service: Open Heart Surgery;  Laterality: N/A;   ENDOVEIN HARVEST OF GREATER SAPHENOUS VEIN Right 05/01/2020   Procedure: ENDOVEIN HARVEST OF GREATER SAPHENOUS VEIN;  Surgeon: Ivin Poot, MD;  Location: [place];  Service: Open Heart  Surgery;  Laterality: Right;   KNEE ARTHROSCOPY  [id number]   left and right   LUMBAR LAMINECTOMY/DECOMPRESSION MICRODISCECTOMY Left 09/29/2017   Procedure: LEFT LUMBAR TWO - LUMBAR THREELAMINOTOMY/MICRODISCECTOMY;  Surgeon: Jovita Gamma, MD;  Location: [place];  Service: Neurosurgery;  Laterality: Left;  LEFT LUMBAR 2- LUMBAR 3 LAMINOTOMY/MICRODISCECTOMY   NASAL SINUS SURGERY  05/2015   RIGHT/LEFT HEART CATH AND CORONARY ANGIOGRAPHY N/A 04/10/2020   Procedure: RIGHT/LEFT HEART CATH AND CORONARY ANGIOGRAPHY;  Surgeon: Troy Sine, MD;  Location: [place] [facility];  Service: Cardiovascular;  Laterality: N/A;   TEE WITHOUT CARDIOVERSION N/A 05/01/2020   Procedure: TRANSESOPHAGEAL ECHOCARDIOGRAM (TEE);  Surgeon: Prescott Gum, Collier Salina, MD;  Location: [place];  Service: Open Heart Surgery;  Laterality: N/A;   TOTAL KNEE ARTHROPLASTY Right 06/24/2016   Procedure: TOTAL KNEE ARTHROPLASTY;  Surgeon: Garald Balding, MD;  Location: [place];  Service: Orthopedics;  Laterality: Right;   TOTAL KNEE ARTHROPLASTY Left 12/18/2020   Procedure: LEFT TOTAL KNEE ARTHROPLASTY;  Surgeon: Garald Balding, MD;  [facility];  Service: Orthopedics;  Laterality: Left;   TRIGGER FINGER RELEASE Left 11/10/2013   Procedure: LEFT INDEX A-1 PULLEY RELEASE;  Surgeon: Cammie Sickle, MD;  Location: [place];  Service:  Orthopedics;  Laterality: Left;   ULNAR NERVE TRANSPOSITION Left 11/10/2013   Procedure: ULNAR NERVE TRANSPOSITION;  Surgeon: Cammie Sickle, MD;  Location: [place];  Service: Orthopedics;  Laterality: Left;    There were no vitals filed for this visit.   Subjective Assessment - 01/28/21 1430     Subjective knee and back are hurting today - "it's because of the weather."    Pertinent History CABG, microdiscectomy L2-3, rt TKA, Lt carpal tunnel release, arthritis, CAD, HTN, sciatica,    Patient Stated Goals To better walking & some plumbing    Currently in Pain? Yes     Pain Score 6     Pain Location Knee    Pain Orientation Left    Pain Descriptors / Indicators Sharp    Pain Type Chronic pain;Surgical pain    Pain Onset More than a month ago    Pain Frequency Constant    Aggravating Factors  weightbearing, static positioning    Pain Relieving Factors changing positions, ice, exercises    Multiple Pain Sites Yes    Pain Score 8    Pain Location Back    Pain Orientation Lower    Pain Descriptors / Indicators Aching    Pain Type Acute pain;Chronic pain    Pain Onset More than a month ago    Pain Frequency Intermittent    Aggravating Factors  weather    Pain Relieving Factors injection, rest                [facility] PT Assessment - 01/28/21 1448       Assessment   Medical Diagnosis Left Total Knee Arthroplasty  J24.268 (ICD-10-CM)    Referring Provider (PT) Joni Fears, MD    Onset Date/Surgical Date 12/18/20    Next MD Visit 01/30/21      Observation/Other Assessments   Focus on Therapeutic Outcomes (FOTO)  51      AROM   Left Knee Extension -7    Left Knee Flexion 110      PROM   Left Knee Extension 0    Left Knee Flexion 115                           [facility] Adult PT Treatment/Exercise - 01/28/21 1432       Knee/Hip Exercises: Aerobic   Recumbent Bike Seat 6 for 8 minutes Level 4      Knee/Hip Exercises: Machines for Strengthening   Cybex Knee Extension 5# LLE only 3x10    Cybex Knee Flexion 15# 3x10 LLE only      Knee/Hip Exercises: Standing   Terminal Knee Extension Left;20 reps;Theraband    Theraband Level (Terminal Knee Extension) Level 4 (Blue)      Knee/Hip Exercises: Seated   Other Seated Knee/Hip Exercises seated SLR 2x10                       PT Short Term Goals - 01/17/21 1657       PT SHORT TERM GOAL #1   Title Patient is independent with initial HEP    Time 4    Period Weeks    Status Achieved    Target Date 02/01/21      PT SHORT TERM GOAL #2   Title PROM left  knee -5* extension to 100* flexion    Baseline -7 to 105 on 01/17/2021    Time 4  Period Weeks    Status Partially Met    Target Date 02/01/21               PT Long Term Goals - 01/08/21 1855       PT LONG TERM GOAL #1   Title FOTO >/= 53%    Time 8    Period Weeks    Status New    Target Date 03/01/21      PT LONG TERM GOAL #2   Title left knee AROM -5* seated to 95* flexion standing    Time 8    Period Weeks    Status New    Target Date 03/01/21      PT LONG TERM GOAL #3   Title left knee PROM 0 - 105    Time 8    Period Weeks    Status New    Target Date 03/01/21      PT LONG TERM GOAL #4   Title Patient ambulates 500' & negotiates ramps, curbs & stairs single rail without assistive device independently.    Time 8    Period Weeks    Status New    Target Date 03/01/21      PT LONG TERM GOAL #5   Title Patient demonstrates ability to perform light plumbing activities including getting on/off floor and lifting / carrying 20# items.    Time 8    Period Weeks    Status New    Target Date 03/01/21      Additional Long Term Goals   Additional Long Term Goals Yes      PT LONG TERM GOAL #6   Title Patient reports left knee pain </= 2/10 with standing & gait.    Time 8    Period Weeks    Status New    Target Date 03/01/21                   Plan - 01/28/21 1508     Clinical Impression Statement Continued focus on TKE and strengthening activities today with positive response.  FOTO score improved 11% to date.  Will continue to benefit from PT to maximzie function.    Personal Factors and Comorbidities Comorbidity 3+;Fitness    Comorbidities CABG 05/01/2020, microdiscectomy L2-3, rt TKA, Lt carpal tunnel release, arthritis, CAD, HTN, sciatica    Examination-Activity Limitations Lift;Carry;Locomotion Level;Squat;Stairs;Stand    Examination-Participation Restrictions Community Activity    Stability/Clinical Decision Making Stable/Uncomplicated     Rehab Potential Good    PT Frequency 2x / week    PT Duration 8 weeks    PT Treatment/Interventions ADLs/Self Care Home Management;Cryotherapy;Electrical Stimulation;Moist Heat;Gait training;Stair training;Functional mobility training;Therapeutic activities;DME Instruction;Therapeutic exercise;Neuromuscular re-education;Balance training;Patient/family education;Manual techniques;Scar mobilization;Passive range of motion;Vasopneumatic Device;Joint Manipulations    PT Next Visit Plan Control edema, quadriceps strength/TKE, balance    PT Home Exercise Plan Access Code: [email or web address]    Consulted and Agree with Plan of Care Patient             Patient will benefit from skilled therapeutic intervention in order to improve the following deficits and impairments:  Abnormal gait, Cardiopulmonary status limiting activity, Decreased activity tolerance, Decreased balance, Decreased coordination, Decreased endurance, Decreased knowledge of precautions, Decreased knowledge of use of DME, Decreased mobility, Decreased range of motion, Decreased skin integrity, Decreased scar mobility, Decreased strength, Difficulty walking, Increased edema, Impaired flexibility, Pain  Visit Diagnosis: Other abnormalities of gait and mobility  Muscle weakness (generalized)  Stiffness of left knee, not  elsewhere classified  Acute pain of left knee  Localized edema  Unsteadiness on feet     Problem List Patient Active Problem List   Diagnosis Date Noted   Total knee replacement status, left 12/18/2020   Adhesive capsulitis of right shoulder 09/26/2020   Carpal tunnel syndrome, bilateral upper limbs 09/26/2020   Bilateral hand pain 09/20/2020   Chronic left shoulder pain 08/23/2020   Right arm pain 08/23/2020   Cubital tunnel syndrome on left 08/23/2020   Left ankle sprain 07/12/2020   Visit for wound check 06/11/2020   Change or removal of surgical wound dressing 05/24/2020   Pulmonary nodule, left 05/23/2020    Cellulitis 05/23/2020   S/P CABG x 2 05/02/2020   Coronary artery disease 05/01/2020   Pulmonary nodule 1 cm or greater in diameter 04/26/2020   Statin intolerance 04/20/2020   Coronary atherosclerosis of native coronary artery 04/18/2020   Abnormal cardiovascular stress test    Nonrheumatic aortic valve stenosis    Exertional dyspnea    Stable angina pectoris ([place])    Bicuspid aortic valve 04/06/2020   Primary osteoarthritis of left knee 11/08/2019   Bilateral primary osteoarthritis of knee 09/28/2019   HNP (herniated nucleus pulposus), lumbar 09/29/2017   Right carotid bruit 06/10/2017   Paresthesia 06/10/2017   Left wrist pain 10/09/2016   Unilateral primary osteoarthritis, right knee 06/24/2016   S/P total knee replacement using cement, right 06/24/2016   Acute pain of left knee 03/27/2016   Mixed hyperlipidemia 01/18/2007   HYPERTENSION 01/18/2007      Laureen Abrahams, PT, DPT 01/28/21 3:10 PM     [facility] [address] [place], [place], [address] Phone: [phone]   Fax:  [phone]  Name: Philip Winters MRN: [id number] Date of Birth: [birth year]

## 2021-01-30 ENCOUNTER — Ambulatory Visit (INDEPENDENT_AMBULATORY_CARE_PROVIDER_SITE_OTHER): Payer: BC Managed Care – PPO | Admitting: Orthopaedic Surgery

## 2021-01-30 ENCOUNTER — Encounter: Payer: Self-pay | Admitting: Orthopaedic Surgery

## 2021-01-30 ENCOUNTER — Other Ambulatory Visit: Payer: Self-pay

## 2021-01-30 VITALS — Ht 68.0 in | Wt 153.0 lb

## 2021-01-30 DIAGNOSIS — Z96651 Presence of right artificial knee joint: Secondary | ICD-10-CM

## 2021-01-30 DIAGNOSIS — Z96652 Presence of left artificial knee joint: Secondary | ICD-10-CM

## 2021-01-30 DIAGNOSIS — M1712 Unilateral primary osteoarthritis, left knee: Secondary | ICD-10-CM

## 2021-01-30 NOTE — Progress Notes (Signed)
Office Visit Note   Patient: Philip Winters           Date of Birth: [birth year]           MRN: [id number] Visit Date: 01/30/2021              Requested by: Antony Contras, MD [place],  [place] [address] PCP: Antony Contras, MD   Assessment & Plan: Visit Diagnoses:    Plan: 6 weeks s/p primary left total knee replacement and doing quite well.  Not using any ambulatory aid.  Continues to work in physical therapy for range of motion and strengthening.  Range of motion is 0 to 105 degrees without instability.  Incision is healing nicely.  Does have quad atrophy and but is working with strengthening.  Would like to see him back in 2 months.  He is not working  Follow-Up Instructions: Return in about 2 months (around 04/01/2021).   Orders:  No orders of the defined types were placed in this encounter.  No orders of the defined types were placed in this encounter.     Procedures: No procedures performed   Clinical Data: No additional findings.   Subjective: Chief Complaint  Patient presents with   Left Knee - Follow-up    12/18/2020 Left TKA  Patient returns for follow up. He is status post left total knee arthroplasty on 12/18/2020. He states that he is doing well with no complaints. He continues to go to physical therapy.  HPI  Review of Systems   Objective: Vital Signs: Ht '5\' 8"'$  (1.727 m)   Wt 153 lb (69.4 kg)   BMI 23.26 kg/m   Physical Exam  Ortho Exam left total knee incision healing without problem.  No opening with varus valgus stress.  No effusion.  Full extension flexed almost 105 degrees with a goniometer.  No calf pain.  No popliteal mass.  Does have atrophy of his thigh but working with exercises.  Doing very well  Specialty Comments:  No specialty comments available.  Imaging: No results found.   PMFS History: Patient Active Problem List   Diagnosis Date Noted   Total knee replacement status, left 12/18/2020   Adhesive  capsulitis of right shoulder 09/26/2020   Carpal tunnel syndrome, bilateral upper limbs 09/26/2020   Bilateral hand pain 09/20/2020   Chronic left shoulder pain 08/23/2020   Right arm pain 08/23/2020   Cubital tunnel syndrome on left 08/23/2020   Left ankle sprain 07/12/2020   Visit for wound check 06/11/2020   Change or removal of surgical wound dressing 05/24/2020   Pulmonary nodule, left 05/23/2020   Cellulitis 05/23/2020   S/P CABG x 2 05/02/2020   Coronary artery disease 05/01/2020   Pulmonary nodule 1 cm or greater in diameter 04/26/2020   Statin intolerance 04/20/2020   Coronary atherosclerosis of native coronary artery 04/18/2020   Abnormal cardiovascular stress test    Nonrheumatic aortic valve stenosis    Exertional dyspnea    Stable angina pectoris (HCC)    Bicuspid aortic valve 04/06/2020   Primary osteoarthritis of left knee 11/08/2019   Bilateral primary osteoarthritis of knee 09/28/2019   HNP (herniated nucleus pulposus), lumbar 09/29/2017   Right carotid bruit 06/10/2017   Paresthesia 06/10/2017   Left wrist pain 10/09/2016   Unilateral primary osteoarthritis, right knee 06/24/2016   S/P total knee replacement using cement, right 06/24/2016   Acute pain of left knee 03/27/2016   Mixed hyperlipidemia 01/18/2007  HYPERTENSION 01/18/2007   Past Medical History:  Diagnosis Date   Allergy    Arthritis    Coronary artery disease    GERD (gastroesophageal reflux disease)    occ   Heart murmur    baby   High cholesterol    Hypertension    Pneumonia    Right carotid bruit 06/10/2017   Sciatica    Seasonal allergies    Sinus tachycardia    Spinal stenosis     History reviewed. No pertinent family history.  Past Surgical History:  Procedure Laterality Date   CARDIAC CATHETERIZATION     CARPAL TUNNEL WITH CUBITAL TUNNEL Left 11/10/2013   Procedure: LEFT CARPAL TUNNEL RELEASE;  Surgeon: Cammie Sickle, MD;  Location: [facility];  Service:  Orthopedics;  Laterality: Left;   COLONOSCOPY     CORONARY ARTERY BYPASS GRAFT N/A 05/01/2020   Procedure: CORONARY ARTERY BYPASS GRAFTING (CABG) X 2 ON CARDIOPULMONARY BYPASS. LIMA TO LAD, SVG TO DIAG;  Surgeon: Ivin Poot, MD;  Location: [place] [facility];  Service: Open Heart Surgery;  Laterality: N/A;   ENDOVEIN HARVEST OF GREATER SAPHENOUS VEIN Right 05/01/2020   Procedure: ENDOVEIN HARVEST OF GREATER SAPHENOUS VEIN;  Surgeon: Ivin Poot, MD;  Location: [facility];  Service: Open Heart Surgery;  Laterality: Right;   KNEE ARTHROSCOPY  [id number]   left and right   LUMBAR LAMINECTOMY/DECOMPRESSION MICRODISCECTOMY Left 09/29/2017   Procedure: LEFT LUMBAR TWO - LUMBAR THREELAMINOTOMY/MICRODISCECTOMY;  Surgeon: Jovita Gamma, MD;  Location: [facility];  Service: Neurosurgery;  Laterality: Left;  LEFT LUMBAR 2- LUMBAR 3 LAMINOTOMY/MICRODISCECTOMY   NASAL SINUS SURGERY  05/2015   RIGHT/LEFT HEART CATH AND CORONARY ANGIOGRAPHY N/A 04/10/2020   Procedure: RIGHT/LEFT HEART CATH AND CORONARY ANGIOGRAPHY;  Surgeon: Troy Sine, MD;  Location: [facility];  Service: Cardiovascular;  Laterality: N/A;   TEE WITHOUT CARDIOVERSION N/A 05/01/2020   Procedure: TRANSESOPHAGEAL ECHOCARDIOGRAM (TEE);  Surgeon: Prescott Gum, Collier Salina, MD;  Location: [facility];  Service: Open Heart Surgery;  Laterality: N/A;   TOTAL KNEE ARTHROPLASTY Right 06/24/2016   Procedure: TOTAL KNEE ARTHROPLASTY;  Surgeon: Garald Balding, MD;  Location: [facility];  Service: Orthopedics;  Laterality: Right;   TOTAL KNEE ARTHROPLASTY Left 12/18/2020   Procedure: LEFT TOTAL KNEE ARTHROPLASTY;  Surgeon: Garald Balding, MD;  Location: [facility];  Service: Orthopedics;  Laterality: Left;   TRIGGER FINGER RELEASE Left 11/10/2013   Procedure: LEFT INDEX A-1 PULLEY RELEASE;  Surgeon: Cammie Sickle, MD;  Location: [facility];  Service: Orthopedics;  Laterality: Left;   ULNAR NERVE TRANSPOSITION Left 11/10/2013   Procedure: ULNAR NERVE  TRANSPOSITION;  Surgeon: Cammie Sickle, MD;  Location: [facility];  Service: Orthopedics;  Laterality: Left;   Social History   Occupational History   Occupation: Self employed  Tobacco Use   Smoking status: Former    Packs/day: 1.00    Years: 37.00    Pack years: 37.00    Types: Cigarettes    Quit date: 05/04/2016    Years since quitting: 4.7   Smokeless tobacco: Never  Vaping Use   Vaping Use: Former  Substance and Sexual Activity   Alcohol use: Yes    Alcohol/week: 3.0 standard drinks    Types: 3 Glasses of wine per week    Comment: weekends only with dinner   Drug use: No   Sexual activity: Not on file

## 2021-01-31 ENCOUNTER — Ambulatory Visit: Payer: BC Managed Care – PPO | Admitting: Orthopaedic Surgery

## 2021-01-31 ENCOUNTER — Encounter: Payer: BC Managed Care – PPO | Admitting: Physical Therapy

## 2021-02-01 DIAGNOSIS — M48061 Spinal stenosis, lumbar region without neurogenic claudication: Secondary | ICD-10-CM | POA: Diagnosis not present

## 2021-02-01 DIAGNOSIS — E782 Mixed hyperlipidemia: Secondary | ICD-10-CM | POA: Diagnosis not present

## 2021-02-01 DIAGNOSIS — Z23 Encounter for immunization: Secondary | ICD-10-CM | POA: Diagnosis not present

## 2021-02-01 DIAGNOSIS — I10 Essential (primary) hypertension: Secondary | ICD-10-CM | POA: Diagnosis not present

## 2021-02-01 DIAGNOSIS — G629 Polyneuropathy, unspecified: Secondary | ICD-10-CM | POA: Diagnosis not present

## 2021-02-04 ENCOUNTER — Encounter: Payer: Self-pay | Admitting: Physical Therapy

## 2021-02-04 ENCOUNTER — Other Ambulatory Visit: Payer: Self-pay

## 2021-02-04 ENCOUNTER — Ambulatory Visit (INDEPENDENT_AMBULATORY_CARE_PROVIDER_SITE_OTHER): Payer: BC Managed Care – PPO | Admitting: Physical Therapy

## 2021-02-04 DIAGNOSIS — M6281 Muscle weakness (generalized): Secondary | ICD-10-CM | POA: Diagnosis not present

## 2021-02-04 DIAGNOSIS — M25662 Stiffness of left knee, not elsewhere classified: Secondary | ICD-10-CM

## 2021-02-04 DIAGNOSIS — M25562 Pain in left knee: Secondary | ICD-10-CM

## 2021-02-04 DIAGNOSIS — R600 Localized edema: Secondary | ICD-10-CM

## 2021-02-04 DIAGNOSIS — R2689 Other abnormalities of gait and mobility: Secondary | ICD-10-CM | POA: Diagnosis not present

## 2021-02-04 DIAGNOSIS — R2681 Unsteadiness on feet: Secondary | ICD-10-CM

## 2021-02-04 NOTE — Therapy (Signed)
[facility] [place], [place], [address] Phone: [phone]   Fax:  [phone]  Physical Therapy Treatment  Patient Details  Name: Philip Winters MRN: [id number] Date of Birth: 1961-01-28 Referring Provider (PT): Joni Fears, MD   Encounter Date: 02/04/2021   PT End of Session - 02/04/21 1512     Visit Number 7    Number of Visits 16    Date for PT Re-Evaluation 03/01/21    Authorization Type [employer]    Authorization Time Period ded & oop max met, 30 visit combo PT/OT/chiro    Authorization - Visit Number 7    Authorization - Number of Visits 30    PT Start Time 8921    PT Stop Time 1512    PT Time Calculation (min) 43 min    Activity Tolerance Patient tolerated treatment well;No increased pain    Behavior During Therapy [facility] for tasks assessed/performed             Past Medical History:  Diagnosis Date   Allergy    Arthritis    Coronary artery disease    GERD (gastroesophageal reflux disease)    occ   Heart murmur    baby   High cholesterol    Hypertension    Pneumonia    Right carotid bruit 06/10/2017   Sciatica    Seasonal allergies    Sinus tachycardia    Spinal stenosis     Past Surgical History:  Procedure Laterality Date   CARDIAC CATHETERIZATION     CARPAL TUNNEL WITH CUBITAL TUNNEL Left 11/10/2013   Procedure: LEFT CARPAL TUNNEL RELEASE;  Surgeon: Cammie Sickle, MD;  Location: [place];  Service: Orthopedics;  Laterality: Left;   COLONOSCOPY     CORONARY ARTERY BYPASS GRAFT N/A 05/01/2020   Procedure: CORONARY ARTERY BYPASS GRAFTING (CABG) X 2 ON CARDIOPULMONARY BYPASS. LIMA TO LAD, SVG TO DIAG;  Surgeon: Ivin Poot, MD;  Location: [facility];  Service: Open Heart Surgery;  Laterality: N/A;   ENDOVEIN HARVEST OF GREATER SAPHENOUS VEIN Right 05/01/2020   Procedure: ENDOVEIN HARVEST OF GREATER SAPHENOUS VEIN;  Surgeon: Ivin Poot, MD;  Location: [facility];  Service: Open Heart  Surgery;  Laterality: Right;   KNEE ARTHROSCOPY  [id number]   left and right   LUMBAR LAMINECTOMY/DECOMPRESSION MICRODISCECTOMY Left 09/29/2017   Procedure: LEFT LUMBAR TWO - LUMBAR THREELAMINOTOMY/MICRODISCECTOMY;  Surgeon: Jovita Gamma, MD;  Location: [facility];  Service: Neurosurgery;  Laterality: Left;  LEFT LUMBAR 2- LUMBAR 3 LAMINOTOMY/MICRODISCECTOMY   NASAL SINUS SURGERY  05/2015   RIGHT/LEFT HEART CATH AND CORONARY ANGIOGRAPHY N/A 04/10/2020   Procedure: RIGHT/LEFT HEART CATH AND CORONARY ANGIOGRAPHY;  Surgeon: Troy Sine, MD;  Location: [facility];  Service: Cardiovascular;  Laterality: N/A;   TEE WITHOUT CARDIOVERSION N/A 05/01/2020   Procedure: TRANSESOPHAGEAL ECHOCARDIOGRAM (TEE);  Surgeon: Prescott Gum, Collier Salina, MD;  Location: [facility];  Service: Open Heart Surgery;  Laterality: N/A;   TOTAL KNEE ARTHROPLASTY Right 06/24/2016   Procedure: TOTAL KNEE ARTHROPLASTY;  Surgeon: Garald Balding, MD;  Location: [facility];  Service: Orthopedics;  Laterality: Right;   TOTAL KNEE ARTHROPLASTY Left 12/18/2020   Procedure: LEFT TOTAL KNEE ARTHROPLASTY;  Surgeon: Garald Balding, MD;  Location[facility];  Service: Orthopedics;  Laterality: Left;   TRIGGER FINGER RELEASE Left 11/10/2013   Procedure: LEFT INDEX A-1 PULLEY RELEASE;  Surgeon: Cammie Sickle, MD;  Location: [place] [facility];  Service:  Orthopedics;  Laterality: Left;   ULNAR NERVE TRANSPOSITION Left 11/10/2013   Procedure: ULNAR NERVE TRANSPOSITION;  Surgeon: Cammie Sickle, MD;  Location: [place] [facility] [place];  Service: Orthopedics;  Laterality: Left;    There were no vitals filed for this visit.   Subjective Assessment - 02/04/21 1431     Subjective MD pleased with progress - doc feels it will take close to a year to get quad back    Pertinent History CABG, microdiscectomy L2-3, rt TKA, Lt carpal tunnel release, arthritis, CAD, HTN, sciatica,    Patient Stated Goals To better walking & some plumbing     Currently in Pain? Yes    Pain Score 4     Pain Location Knee    Pain Orientation Left    Pain Descriptors / Indicators Sharp;Aching    Pain Type Acute pain;Chronic pain;Surgical pain    Pain Onset More than a month ago    Pain Frequency Constant    Aggravating Factors  weightbearing, static positioning    Pain Relieving Factors changins positions, ice, exercises                               [facility] Adult PT Treatment/Exercise - 02/04/21 1432       Knee/Hip Exercises: Aerobic   Recumbent Bike L4 x 8 min      Knee/Hip Exercises: Machines for Strengthening   Cybex Knee Extension 5# LLE only 3x10    Cybex Knee Flexion 15# 3x10 LLE only    Total Gym Leg Press 125# bil push 3x10; LLE only 50# 3x10; 62# x10      Knee/Hip Exercises: Standing   Forward Step Up 2 sets;10 reps;Step Height: 6";Both;Hand Hold: 0    SLS on foam 5x10 sec; bil with light UE support    Other Standing Knee Exercises RDL 10# KB 2x10                       PT Short Term Goals - 01/17/21 1657       PT SHORT TERM GOAL #1   Title Patient is independent with initial HEP    Time 4    Period Weeks    Status Achieved    Target Date 02/01/21      PT SHORT TERM GOAL #2   Title PROM left knee -5* extension to 100* flexion    Baseline -7 to 105 on 01/17/2021    Time 4    Period Weeks    Status Partially Met    Target Date 02/01/21               PT Long Term Goals - 02/04/21 1512       PT LONG TERM GOAL #1   Title FOTO >/= 53%    Time 8    Period Weeks    Status On-going    Target Date 03/01/21      PT LONG TERM GOAL #2   Title left knee AROM -5* seated to 95* flexion standing    Time 8    Period Weeks    Status On-going      PT LONG TERM GOAL #3   Title left knee PROM 0 - 105    Time 8    Period Weeks    Status On-going      PT LONG TERM GOAL #4   Title Patient ambulates 500' & negotiates  ramps, curbs & stairs single rail without assistive device  independently.    Time 8    Period Weeks    Status On-going      PT LONG TERM GOAL #5   Title Patient demonstrates ability to perform light plumbing activities including getting on/off floor and lifting / carrying 20# items.    Time 8    Period Weeks    Status On-going      PT LONG TERM GOAL #6   Title Patient reports left knee pain </= 2/10 with standing & gait.    Time 8    Period Weeks    Status On-going                   Plan - 02/04/21 1513     Clinical Impression Statement Pt tolerated session well today with continued focus on strengthening and balance.  Overall progressing well with PT.  Will continue to benefit from PT to maximize function.    Personal Factors and Comorbidities Comorbidity 3+;Fitness    Comorbidities CABG 05/01/2020, microdiscectomy L2-3, rt TKA, Lt carpal tunnel release, arthritis, CAD, HTN, sciatica    Examination-Activity Limitations Lift;Carry;Locomotion Level;Squat;Stairs;Stand    Examination-Participation Restrictions Community Activity    Stability/Clinical Decision Making Stable/Uncomplicated    Rehab Potential Good    PT Frequency 2x / week    PT Duration 8 weeks    PT Treatment/Interventions ADLs/Self Care Home Management;Cryotherapy;Electrical Stimulation;Moist Heat;Gait training;Stair training;Functional mobility training;Therapeutic activities;DME Instruction;Therapeutic exercise;Neuromuscular re-education;Balance training;Patient/family education;Manual techniques;Scar mobilization;Passive range of motion;Vasopneumatic Device;Joint Manipulations    PT Next Visit Plan quadriceps strength/TKE, balance    PT Home Exercise Plan Access Code: [email or web address]    ZCHYIFOYD and Agree with Plan of Care Patient             Patient will benefit from skilled therapeutic intervention in order to improve the following deficits and impairments:  Abnormal gait, Cardiopulmonary status limiting activity, Decreased activity tolerance, Decreased balance,  Decreased coordination, Decreased endurance, Decreased knowledge of precautions, Decreased knowledge of use of DME, Decreased mobility, Decreased range of motion, Decreased skin integrity, Decreased scar mobility, Decreased strength, Difficulty walking, Increased edema, Impaired flexibility, Pain  Visit Diagnosis: Other abnormalities of gait and mobility  Muscle weakness (generalized)  Stiffness of left knee, not elsewhere classified  Acute pain of left knee  Localized edema  Unsteadiness on feet     Problem List Patient Active Problem List   Diagnosis Date Noted   Total knee replacement status, left 12/18/2020   Adhesive capsulitis of right shoulder 09/26/2020   Carpal tunnel syndrome, bilateral upper limbs 09/26/2020   Bilateral hand pain 09/20/2020   Chronic left shoulder pain 08/23/2020   Right arm pain 08/23/2020   Cubital tunnel syndrome on left 08/23/2020   Left ankle sprain 07/12/2020   Visit for wound check 06/11/2020   Change or removal of surgical wound dressing 05/24/2020   Pulmonary nodule, left 05/23/2020   Cellulitis 05/23/2020   S/P CABG x 2 05/02/2020   Coronary artery disease 05/01/2020   Pulmonary nodule 1 cm or greater in diameter 04/26/2020   Statin intolerance 04/20/2020   Coronary atherosclerosis of native coronary artery 04/18/2020   Abnormal cardiovascular stress test    Nonrheumatic aortic valve stenosis    Exertional dyspnea    Stable angina pectoris (HCC)    Bicuspid aortic valve 04/06/2020   Primary osteoarthritis of left knee 11/08/2019   Bilateral primary osteoarthritis of knee 09/28/2019   HNP (herniated nucleus pulposus), lumbar 09/29/2017  Right carotid bruit 06/10/2017   Paresthesia 06/10/2017   Left wrist pain 10/09/2016   Unilateral primary osteoarthritis, right knee 06/24/2016   S/P total knee replacement using cement, right 06/24/2016   Acute pain of left knee 03/27/2016   Mixed hyperlipidemia 01/18/2007   HYPERTENSION  01/18/2007      Laureen Abrahams, PT, DPT 02/04/21 3:14 PM     [facility] [address] [place], [place], [address] Phone: [phone]   Fax:  [phone]  Name: JAYTHEN HAMME MRN: [id number] Date of Birth: Feb 25, 1961

## 2021-02-06 ENCOUNTER — Other Ambulatory Visit: Payer: Self-pay

## 2021-02-06 ENCOUNTER — Encounter: Payer: Self-pay | Admitting: Physical Therapy

## 2021-02-06 ENCOUNTER — Ambulatory Visit (INDEPENDENT_AMBULATORY_CARE_PROVIDER_SITE_OTHER): Payer: BC Managed Care – PPO | Admitting: Physical Therapy

## 2021-02-06 DIAGNOSIS — M6281 Muscle weakness (generalized): Secondary | ICD-10-CM

## 2021-02-06 DIAGNOSIS — M25562 Pain in left knee: Secondary | ICD-10-CM | POA: Diagnosis not present

## 2021-02-06 DIAGNOSIS — R2689 Other abnormalities of gait and mobility: Secondary | ICD-10-CM

## 2021-02-06 DIAGNOSIS — R600 Localized edema: Secondary | ICD-10-CM

## 2021-02-06 DIAGNOSIS — M25662 Stiffness of left knee, not elsewhere classified: Secondary | ICD-10-CM | POA: Diagnosis not present

## 2021-02-06 DIAGNOSIS — R2681 Unsteadiness on feet: Secondary | ICD-10-CM

## 2021-02-06 NOTE — Therapy (Signed)
[place], [place], [address] Phone: [phone]   Fax:  [phone]  Physical Therapy Treatment  Patient Details  Name: Philip Winters MRN: [id number] Date of Birth: 05-Dec-1960 Referring Provider (PT): Joni Fears, MD   Encounter Date: 02/06/2021   PT End of Session - 02/06/21 1504     Visit Number 8    Number of Visits 16    Date for PT Re-Evaluation 03/01/21    Authorization Type BCBS    Authorization Time Period ded & oop max met, 30 visit combo PT/OT/chiro    Authorization - Visit Number 8    Authorization - Number of Visits 30    PT Start Time 9528    PT Stop Time 1504    PT Time Calculation (min) 39 min    Activity Tolerance Patient tolerated treatment well;No increased pain    Behavior During Therapy [facility] for tasks assessed/performed             Past Medical History:  Diagnosis Date   Allergy    Arthritis    Coronary artery disease    GERD (gastroesophageal reflux disease)    occ   Heart murmur    baby   High cholesterol    Hypertension    Pneumonia    Right carotid bruit 06/10/2017   Sciatica    Seasonal allergies    Sinus tachycardia    Spinal stenosis     Past Surgical History:  Procedure Laterality Date   CARDIAC CATHETERIZATION     CARPAL TUNNEL WITH CUBITAL TUNNEL Left 11/10/2013   Procedure: LEFT CARPAL TUNNEL RELEASE;  Surgeon: Cammie Sickle, MD;  Location: [place];  Service: Orthopedics;  Laterality: Left;   COLONOSCOPY     CORONARY ARTERY BYPASS GRAFT N/A 05/01/2020   Procedure: CORONARY ARTERY BYPASS GRAFTING (CABG) X 2 ON CARDIOPULMONARY BYPASS. LIMA TO LAD, SVG TO DIAG;  Surgeon: Ivin Poot, MD;  Location: [place];  Service: Open Heart Surgery;  Laterality: N/A;   ENDOVEIN HARVEST OF GREATER SAPHENOUS VEIN Right 05/01/2020   Procedure: ENDOVEIN HARVEST OF GREATER SAPHENOUS VEIN;  Surgeon: Ivin Poot, MD;  Location: [place];  Service: Open Heart  Surgery;  Laterality: Right;   KNEE ARTHROSCOPY  [id number]   left and right   LUMBAR LAMINECTOMY/DECOMPRESSION MICRODISCECTOMY Left 09/29/2017   Procedure: LEFT LUMBAR TWO - LUMBAR THREELAMINOTOMY/MICRODISCECTOMY;  Surgeon: Jovita Gamma, MD;  Location: [place];  Service: Neurosurgery;  Laterality: Left;  LEFT LUMBAR 2- LUMBAR 3 LAMINOTOMY/MICRODISCECTOMY   NASAL SINUS SURGERY  05/2015   RIGHT/LEFT HEART CATH AND CORONARY ANGIOGRAPHY N/A 04/10/2020   Procedure: RIGHT/LEFT HEART CATH AND CORONARY ANGIOGRAPHY;  Surgeon: Philip Sine, MD;  Location: [place] [facility];  Service: Cardiovascular;  Laterality: N/A;   TEE WITHOUT CARDIOVERSION N/A 05/01/2020   Procedure: TRANSESOPHAGEAL ECHOCARDIOGRAM (TEE);  Surgeon: Prescott Gum, Collier Salina, MD;  Location: [place];  Service: Open Heart Surgery;  Laterality: N/A;   TOTAL KNEE ARTHROPLASTY Right 06/24/2016   Procedure: TOTAL KNEE ARTHROPLASTY;  Surgeon: Garald Balding, MD;  Location: [place];  Service: Orthopedics;  Laterality: Right;   TOTAL KNEE ARTHROPLASTY Left 12/18/2020   Procedure: LEFT TOTAL KNEE ARTHROPLASTY;  Surgeon: Garald Balding, MD;  [facility];  Service: Orthopedics;  Laterality: Left;   TRIGGER FINGER RELEASE Left 11/10/2013   Procedure: LEFT INDEX A-1 PULLEY RELEASE;  Surgeon: Cammie Sickle, MD;  Location: [place];  Service:  Orthopedics;  Laterality: Left;   ULNAR NERVE TRANSPOSITION Left 11/10/2013   Procedure: ULNAR NERVE TRANSPOSITION;  Surgeon: Cammie Sickle, MD;  Location: [place];  Service: Orthopedics;  Laterality: Left;    There were no vitals filed for this visit.   Subjective Assessment - 02/06/21 1425     Subjective doing well.  knee is stiff    Pertinent History CABG, microdiscectomy L2-3, rt TKA, Lt carpal tunnel release, arthritis, CAD, HTN, sciatica,    Patient Stated Goals To better walking & some plumbing    Currently in Pain? Yes    Pain Score 6     Pain Location  Knee    Pain Orientation Left    Pain Descriptors / Indicators Aching;Sore    Pain Type Acute pain;Chronic pain;Surgical pain    Pain Onset More than a month ago    Pain Frequency Constant    Aggravating Factors  weightbearing, static positioning    Pain Relieving Factors changing positions, ice, exercises                               [facility] Adult PT Treatment/Exercise - 02/06/21 1429       Neuro Re-ed    Neuro Re-ed Details  tandem walking and slow marching for SLS activities      Knee/Hip Exercises: Stretches   Gastroc Stretch Both;3 reps;30 seconds    Gastroc Stretch Limitations slant board      Knee/Hip Exercises: Aerobic   Recumbent Bike L4 x 8 min      Knee/Hip Exercises: Machines for Strengthening   Cybex Knee Extension 5# LLE only 3x10    Cybex Knee Flexion 15# 3x10 LLE only    Total Gym Leg Press 150# bil push 3x10; LLE only 62# 3x10      Knee/Hip Exercises: Standing   Walking with Sports Cord forward and lateral with orange sports cord    Other Standing Knee Exercises RLE heel tap to floor with LLE on 6" step 3x10, 1 UE support                       PT Short Term Goals - 01/17/21 1657       PT SHORT TERM GOAL #1   Title Patient is independent with initial HEP    Time 4    Period Weeks    Status Achieved    Target Date 02/01/21      PT SHORT TERM GOAL #2   Title PROM left knee -5* extension to 100* flexion    Baseline -7 to 105 on 01/17/2021    Time 4    Period Weeks    Status Partially Met    Target Date 02/01/21               PT Long Term Goals - 02/04/21 1512       PT LONG TERM GOAL #1   Title FOTO >/= 53%    Time 8    Period Weeks    Status On-going    Target Date 03/01/21      PT LONG TERM GOAL #2   Title left knee AROM -5* seated to 95* flexion standing    Time 8    Period Weeks    Status On-going      PT LONG TERM GOAL #3   Title left knee PROM 0 - 105    Time  8    Period Weeks    Status  On-going      PT LONG TERM GOAL #4   Title Patient ambulates 500' & negotiates ramps, curbs & stairs single rail without assistive device independently.    Time 8    Period Weeks    Status On-going      PT LONG TERM GOAL #5   Title Patient demonstrates ability to perform light plumbing activities including getting on/off floor and lifting / carrying 20# items.    Time 8    Period Weeks    Status On-going      PT LONG TERM GOAL #6   Title Patient reports left knee pain </= 2/10 with standing & gait.    Time 8    Period Weeks    Status On-going                   Plan - 02/06/21 1504     Clinical Impression Statement Continued focus on strengthening and balance exercises today with good tolerance.  Expected fatigue noted.  Will continue to benefit from PT to maximize function.  Anticipate d/c next 2 weeks.    Personal Factors and Comorbidities Comorbidity 3+;Fitness    Comorbidities CABG 05/01/2020, microdiscectomy L2-3, rt TKA, Lt carpal tunnel release, arthritis, CAD, HTN, sciatica    Examination-Activity Limitations Lift;Carry;Locomotion Level;Squat;Stairs;Stand    Examination-Participation Restrictions Community Activity    Stability/Clinical Decision Making Stable/Uncomplicated    Rehab Potential Good    PT Frequency 2x / week    PT Duration 8 weeks    PT Treatment/Interventions ADLs/Self Care Home Management;Cryotherapy;Electrical Stimulation;Moist Heat;Gait training;Stair training;Functional mobility training;Therapeutic activities;DME Instruction;Therapeutic exercise;Neuromuscular re-education;Balance training;Patient/family education;Manual techniques;Scar mobilization;Passive range of motion;Vasopneumatic Device;Joint Manipulations    PT Next Visit Plan quadriceps strength/TKE, balance, check/updated HEP    PT Home Exercise Plan Access Code: [email or web address]    Consulted and Agree with Plan of Care Patient             Patient will benefit from skilled therapeutic  intervention in order to improve the following deficits and impairments:  Abnormal gait, Cardiopulmonary status limiting activity, Decreased activity tolerance, Decreased balance, Decreased coordination, Decreased endurance, Decreased knowledge of precautions, Decreased knowledge of use of DME, Decreased mobility, Decreased range of motion, Decreased skin integrity, Decreased scar mobility, Decreased strength, Difficulty walking, Increased edema, Impaired flexibility, Pain  Visit Diagnosis: Other abnormalities of gait and mobility  Muscle weakness (generalized)  Stiffness of left knee, not elsewhere classified  Acute pain of left knee  Localized edema  Unsteadiness on feet     Problem List Patient Active Problem List   Diagnosis Date Noted   Total knee replacement status, left 12/18/2020   Adhesive capsulitis of right shoulder 09/26/2020   Carpal tunnel syndrome, bilateral upper limbs 09/26/2020   Bilateral hand pain 09/20/2020   Chronic left shoulder pain 08/23/2020   Right arm pain 08/23/2020   Cubital tunnel syndrome on left 08/23/2020   Left ankle sprain 07/12/2020   Visit for wound check 06/11/2020   Change or removal of surgical wound dressing 05/24/2020   Pulmonary nodule, left 05/23/2020   Cellulitis 05/23/2020   S/P CABG x 2 05/02/2020   Coronary artery disease 05/01/2020   Pulmonary nodule 1 cm or greater in diameter 04/26/2020   Statin intolerance 04/20/2020   Coronary atherosclerosis of native coronary artery 04/18/2020   Abnormal cardiovascular stress test    Nonrheumatic aortic valve stenosis    Exertional dyspnea    Stable  angina pectoris ([place])    Bicuspid aortic valve 04/06/2020   Primary osteoarthritis of left knee 11/08/2019   Bilateral primary osteoarthritis of knee 09/28/2019   HNP (herniated nucleus pulposus), lumbar 09/29/2017   Right carotid bruit 06/10/2017   Paresthesia 06/10/2017   Left wrist pain 10/09/2016   Unilateral primary  osteoarthritis, right knee 06/24/2016   S/P total knee replacement using cement, right 06/24/2016   Acute pain of left knee 03/27/2016   Mixed hyperlipidemia 01/18/2007   HYPERTENSION 01/18/2007      Laureen Abrahams, PT, DPT 02/06/21 3:07 PM      [facility] [address] [place], [place], [address] Phone: [phone]   Fax:  [phone]  Name: Philip Winters MRN: [id number] Date of Birth: 01/13/1961

## 2021-02-11 ENCOUNTER — Encounter: Payer: Self-pay | Admitting: Physical Therapy

## 2021-02-11 ENCOUNTER — Other Ambulatory Visit: Payer: Self-pay

## 2021-02-11 ENCOUNTER — Ambulatory Visit (INDEPENDENT_AMBULATORY_CARE_PROVIDER_SITE_OTHER): Payer: BC Managed Care – PPO | Admitting: Physical Therapy

## 2021-02-11 DIAGNOSIS — M6281 Muscle weakness (generalized): Secondary | ICD-10-CM | POA: Diagnosis not present

## 2021-02-11 DIAGNOSIS — R600 Localized edema: Secondary | ICD-10-CM

## 2021-02-11 DIAGNOSIS — M25662 Stiffness of left knee, not elsewhere classified: Secondary | ICD-10-CM | POA: Diagnosis not present

## 2021-02-11 DIAGNOSIS — M25562 Pain in left knee: Secondary | ICD-10-CM | POA: Diagnosis not present

## 2021-02-11 DIAGNOSIS — R2689 Other abnormalities of gait and mobility: Secondary | ICD-10-CM | POA: Diagnosis not present

## 2021-02-11 DIAGNOSIS — R2681 Unsteadiness on feet: Secondary | ICD-10-CM

## 2021-02-11 NOTE — Therapy (Signed)
[place], [place], [address] Phone: [phone]   Fax:  [phone]  Physical Therapy Treatment  Patient Details  Name: Philip Winters MRN: [id number] Date of Birth: 1961-03-30 Referring Provider (PT): Joni Fears, MD   Encounter Date: 02/11/2021   PT End of Session - 02/11/21 1511     Visit Number 9    Number of Visits 16    Date for PT Re-Evaluation 03/01/21    Authorization Type BCBS    Authorization Time Period ded & oop max met, 30 visit combo PT/OT/chiro    Authorization - Visit Number 9    Authorization - Number of Visits 30    PT Start Time 0623    PT Stop Time 1510    PT Time Calculation (min) 41 min    Activity Tolerance Patient tolerated treatment well;No increased pain    Behavior During Therapy [facility] for tasks assessed/performed             Past Medical History:  Diagnosis Date   Allergy    Arthritis    Coronary artery disease    GERD (gastroesophageal reflux disease)    occ   Heart murmur    baby   High cholesterol    Hypertension    Pneumonia    Right carotid bruit 06/10/2017   Sciatica    Seasonal allergies    Sinus tachycardia    Spinal stenosis     Past Surgical History:  Procedure Laterality Date   CARDIAC CATHETERIZATION     CARPAL TUNNEL WITH CUBITAL TUNNEL Left 11/10/2013   Procedure: LEFT CARPAL TUNNEL RELEASE;  Surgeon: Cammie Sickle, MD;  Location: [place];  Service: Orthopedics;  Laterality: Left;   COLONOSCOPY     CORONARY ARTERY BYPASS GRAFT N/A 05/01/2020   Procedure: CORONARY ARTERY BYPASS GRAFTING (CABG) X 2 ON CARDIOPULMONARY BYPASS. LIMA TO LAD, SVG TO DIAG;  Surgeon: Ivin Poot, MD;  Location: [place];  Service: Open Heart Surgery;  Laterality: N/A;   ENDOVEIN HARVEST OF GREATER SAPHENOUS VEIN Right 05/01/2020   Procedure: ENDOVEIN HARVEST OF GREATER SAPHENOUS VEIN;  Surgeon: Ivin Poot, MD;  Location: [place];  Service: Open Heart  Surgery;  Laterality: Right;   KNEE ARTHROSCOPY  [id number]   left and right   LUMBAR LAMINECTOMY/DECOMPRESSION MICRODISCECTOMY Left 09/29/2017   Procedure: LEFT LUMBAR TWO - LUMBAR THREELAMINOTOMY/MICRODISCECTOMY;  Surgeon: Jovita Gamma, MD;  Location: [place];  Service: Neurosurgery;  Laterality: Left;  LEFT LUMBAR 2- LUMBAR 3 LAMINOTOMY/MICRODISCECTOMY   NASAL SINUS SURGERY  05/2015   RIGHT/LEFT HEART CATH AND CORONARY ANGIOGRAPHY N/A 04/10/2020   Procedure: RIGHT/LEFT HEART CATH AND CORONARY ANGIOGRAPHY;  Surgeon: Troy Sine, MD;  Location: [place] [facility];  Service: Cardiovascular;  Laterality: N/A;   TEE WITHOUT CARDIOVERSION N/A 05/01/2020   Procedure: TRANSESOPHAGEAL ECHOCARDIOGRAM (TEE);  Surgeon: Prescott Gum, Collier Salina, MD;  Location: [place];  Service: Open Heart Surgery;  Laterality: N/A;   TOTAL KNEE ARTHROPLASTY Right 06/24/2016   Procedure: TOTAL KNEE ARTHROPLASTY;  Surgeon: Garald Balding, MD;  Location: [place];  Service: Orthopedics;  Laterality: Right;   TOTAL KNEE ARTHROPLASTY Left 12/18/2020   Procedure: LEFT TOTAL KNEE ARTHROPLASTY;  Surgeon: Garald Balding, MD;  [facility];  Service: Orthopedics;  Laterality: Left;   TRIGGER FINGER RELEASE Left 11/10/2013   Procedure: LEFT INDEX A-1 PULLEY RELEASE;  Surgeon: Cammie Sickle, MD;  Location: [place];  Service:  Orthopedics;  Laterality: Left;   ULNAR NERVE TRANSPOSITION Left 11/10/2013   Procedure: ULNAR NERVE TRANSPOSITION;  Surgeon: Cammie Sickle, MD;  Location: [place];  Service: Orthopedics;  Laterality: Left;    There were no vitals filed for this visit.   Subjective Assessment - 02/11/21 1430     Subjective sat for a long time this weekend so knee is a little sore    Pertinent History CABG, microdiscectomy L2-3, rt TKA, Lt carpal tunnel release, arthritis, CAD, HTN, sciatica,    Patient Stated Goals To better walking & some plumbing    Currently in Pain? Yes     Pain Score 6     Pain Location Knee    Pain Orientation Left    Pain Descriptors / Indicators Aching;Sore    Pain Type Acute pain;Chronic pain;Surgical pain    Pain Onset More than a month ago    Pain Frequency Constant    Aggravating Factors  weightbearing, static positioning    Pain Relieving Factors repositioning, ice, exercises                [facility] PT Assessment - 02/11/21 1449       Assessment   Medical Diagnosis Left Total Knee Arthroplasty  E08.144 (ICD-10-CM)    Referring Provider (PT) Joni Fears, MD    Onset Date/Surgical Date 12/18/20    Hand Dominance Right      AROM   Left Knee Extension -5    Left Knee Flexion 110                           [facility] Adult PT Treatment/Exercise - 02/11/21 1432       Knee/Hip Exercises: Stretches   Passive Hamstring Stretch Left;3 reps;30 seconds    Passive Hamstring Stretch Limitations supine with strap -overpressue at distal thigh      Knee/Hip Exercises: Aerobic   Recumbent Bike L4 x 8 min      Knee/Hip Exercises: Machines for Strengthening   Cybex Knee Extension 5# LLE only 3x10    Cybex Knee Flexion 15# 3x10 LLE only      Knee/Hip Exercises: Seated   Other Seated Knee/Hip Exercises seated SLR 2x10; 5#    Sit to Sand 2 sets;10 reps;without UE support      Knee/Hip Exercises: Supine   Short Arc Quad Sets Left;3 sets;10 reps    Short Arc Quad Sets Limitations 5 sec hold with 5# weight for [employer]                       PT Short Term Goals - 01/17/21 1657       PT SHORT TERM GOAL #1   Title Patient is independent with initial HEP    Time 4    Period Weeks    Status Achieved    Target Date 02/01/21      PT SHORT TERM GOAL #2   Title PROM left knee -5* extension to 100* flexion    Baseline -7 to 105 on 01/17/2021    Time 4    Period Weeks    Status Partially Met    Target Date 02/01/21               PT Long Term Goals - 02/04/21 1512       PT LONG TERM GOAL #1    Title FOTO >/= 53%    Time 8  Period Weeks    Status On-going    Target Date 03/01/21      PT LONG TERM GOAL #2   Title left knee AROM -5* seated to 95* flexion standing    Time 8    Period Weeks    Status On-going      PT LONG TERM GOAL #3   Title left knee PROM 0 - 105    Time 8    Period Weeks    Status On-going      PT LONG TERM GOAL #4   Title Patient ambulates 500' & negotiates ramps, curbs & stairs single rail without assistive device independently.    Time 8    Period Weeks    Status On-going      PT LONG TERM GOAL #5   Title Patient demonstrates ability to perform light plumbing activities including getting on/off floor and lifting / carrying 20# items.    Time 8    Period Weeks    Status On-going      PT LONG TERM GOAL #6   Title Patient reports left knee pain </= 2/10 with standing & gait.    Time 8    Period Weeks    Status On-going                   Plan - 02/11/21 1511     Clinical Impression Statement Pt tolerated session well today with work on quad strengthening and TKE.  Will continue to benefit from PT to maximize function.    Personal Factors and Comorbidities Comorbidity 3+;Fitness    Comorbidities CABG 05/01/2020, microdiscectomy L2-3, rt TKA, Lt carpal tunnel release, arthritis, CAD, HTN, sciatica    Examination-Activity Limitations Lift;Carry;Locomotion Level;Squat;Stairs;Stand    Examination-Participation Restrictions Community Activity    Stability/Clinical Decision Making Stable/Uncomplicated    Rehab Potential Good    PT Frequency 2x / week    PT Duration 8 weeks    PT Treatment/Interventions ADLs/Self Care Home Management;Cryotherapy;Electrical Stimulation;Moist Heat;Gait training;Stair training;Functional mobility training;Therapeutic activities;DME Instruction;Therapeutic exercise;Neuromuscular re-education;Balance training;Patient/family education;Manual techniques;Scar mobilization;Passive range of motion;Vasopneumatic  Device;Joint Manipulations    PT Next Visit Plan quadriceps strength/TKE, balance, work on d/c planning    PT Home Exercise Plan Access Code: [email or web address]    Consulted and Agree with Plan of Care Patient             Patient will benefit from skilled therapeutic intervention in order to improve the following deficits and impairments:  Abnormal gait, Cardiopulmonary status limiting activity, Decreased activity tolerance, Decreased balance, Decreased coordination, Decreased endurance, Decreased knowledge of precautions, Decreased knowledge of use of DME, Decreased mobility, Decreased range of motion, Decreased skin integrity, Decreased scar mobility, Decreased strength, Difficulty walking, Increased edema, Impaired flexibility, Pain  Visit Diagnosis: Other abnormalities of gait and mobility  Muscle weakness (generalized)  Stiffness of left knee, not elsewhere classified  Acute pain of left knee  Localized edema  Unsteadiness on feet     Problem List Patient Active Problem List   Diagnosis Date Noted   Total knee replacement status, left 12/18/2020   Adhesive capsulitis of right shoulder 09/26/2020   Carpal tunnel syndrome, bilateral upper limbs 09/26/2020   Bilateral hand pain 09/20/2020   Chronic left shoulder pain 08/23/2020   Right arm pain 08/23/2020   Cubital tunnel syndrome on left 08/23/2020   Left ankle sprain 07/12/2020   Visit for wound check 06/11/2020   Change or removal of surgical wound dressing 05/24/2020   Pulmonary nodule, left  05/23/2020   Cellulitis 05/23/2020   S/P CABG x 2 05/02/2020   Coronary artery disease 05/01/2020   Pulmonary nodule 1 cm or greater in diameter 04/26/2020   Statin intolerance 04/20/2020   Coronary atherosclerosis of native coronary artery 04/18/2020   Abnormal cardiovascular stress test    Nonrheumatic aortic valve stenosis    Exertional dyspnea    Stable angina pectoris ([facility])    Bicuspid aortic valve 04/06/2020   Primary  osteoarthritis of left knee 11/08/2019   Bilateral primary osteoarthritis of knee 09/28/2019   HNP (herniated nucleus pulposus), lumbar 09/29/2017   Right carotid bruit 06/10/2017   Paresthesia 06/10/2017   Left wrist pain 10/09/2016   Unilateral primary osteoarthritis, right knee 06/24/2016   S/P total knee replacement using cement, right 06/24/2016   Acute pain of left knee 03/27/2016   Mixed hyperlipidemia 01/18/2007   HYPERTENSION 01/18/2007        Laureen Abrahams, PT, DPT 02/11/21 3:14 PM     [facility] [address] [place], [place], [address] Phone: [phone]   Fax:  [phone]  Name: Philip Winters MRN: [id number] Date of Birth: 1960/09/21

## 2021-02-13 ENCOUNTER — Other Ambulatory Visit: Payer: Self-pay

## 2021-02-13 ENCOUNTER — Encounter: Payer: Self-pay | Admitting: Physical Therapy

## 2021-02-13 ENCOUNTER — Ambulatory Visit (INDEPENDENT_AMBULATORY_CARE_PROVIDER_SITE_OTHER): Payer: BC Managed Care – PPO | Admitting: Physical Therapy

## 2021-02-13 DIAGNOSIS — M25662 Stiffness of left knee, not elsewhere classified: Secondary | ICD-10-CM | POA: Diagnosis not present

## 2021-02-13 DIAGNOSIS — R2689 Other abnormalities of gait and mobility: Secondary | ICD-10-CM | POA: Diagnosis not present

## 2021-02-13 DIAGNOSIS — M6281 Muscle weakness (generalized): Secondary | ICD-10-CM | POA: Diagnosis not present

## 2021-02-13 DIAGNOSIS — M25562 Pain in left knee: Secondary | ICD-10-CM

## 2021-02-13 DIAGNOSIS — R2681 Unsteadiness on feet: Secondary | ICD-10-CM

## 2021-02-13 DIAGNOSIS — R600 Localized edema: Secondary | ICD-10-CM

## 2021-02-13 NOTE — Therapy (Signed)
[place] [place], [place], [address] Phone: [phone]   Fax:  [phone]  Physical Therapy Treatment/Discharge Summary  Patient Details  Name: Philip Winters MRN: [id number] Date of Birth: 08-04-1960 Referring Provider (PT): Joni Fears, MD   Encounter Date: 02/13/2021   PT End of Session - 02/13/21 1458     Visit Number 10    Authorization Type BC[employer]    Authorization Time Period ded & oop max met, 30 visit combo PT/OT/chiro    Authorization - Visit Number 10    Authorization - Number of Visits 30    PT Start Time 1430    PT Stop Time 7253    PT Time Calculation (min) 26 min    Activity Tolerance Patient tolerated treatment well;No increased pain    Behavior During Therapy [facility] for tasks assessed/performed             Past Medical History:  Diagnosis Date   Allergy    Arthritis    Coronary artery disease    GERD (gastroesophageal reflux disease)    occ   Heart murmur    baby   High cholesterol    Hypertension    Pneumonia    Right carotid bruit 06/10/2017   Sciatica    Seasonal allergies    Sinus tachycardia    Spinal stenosis     Past Surgical History:  Procedure Laterality Date   CARDIAC CATHETERIZATION     CARPAL TUNNEL WITH CUBITAL TUNNEL Left 11/10/2013   Procedure: LEFT CARPAL TUNNEL RELEASE;  Surgeon: Cammie Sickle, MD;  Location: [place];  Service: Orthopedics;  Laterality: Left;   COLONOSCOPY     CORONARY ARTERY BYPASS GRAFT N/A 05/01/2020   Procedure: CORONARY ARTERY BYPASS GRAFTING (CABG) X 2 ON CARDIOPULMONARY BYPASS. LIMA TO LAD, SVG TO DIAG;  Surgeon: Ivin Poot, MD;  Location: [place];  Service: Open Heart Surgery;  Laterality: N/A;   ENDOVEIN HARVEST OF GREATER SAPHENOUS VEIN Right 05/01/2020   Procedure: ENDOVEIN HARVEST OF GREATER SAPHENOUS VEIN;  Surgeon: Ivin Poot, MD;  Location: [place];  Service: Open Heart Surgery;  Laterality: Right;   KNEE  ARTHROSCOPY  [phone]   left and right   LUMBAR LAMINECTOMY/DECOMPRESSION MICRODISCECTOMY Left 09/29/2017   Procedure: LEFT LUMBAR TWO - LUMBAR THREELAMINOTOMY/MICRODISCECTOMY;  Surgeon: Jovita Gamma, MD;  Location: [place];  Service: Neurosurgery;  Laterality: Left;  LEFT LUMBAR 2- LUMBAR 3 LAMINOTOMY/MICRODISCECTOMY   NASAL SINUS SURGERY  05/2015   RIGHT/LEFT HEART CATH AND CORONARY ANGIOGRAPHY N/A 04/10/2020   Procedure: RIGHT/LEFT HEART CATH AND CORONARY ANGIOGRAPHY;  Surgeon: Troy Sine, MD;  Location: [place] [facility];  Service: Cardiovascular;  Laterality: N/A;   TEE WITHOUT CARDIOVERSION N/A 05/01/2020   Procedure: TRANSESOPHAGEAL ECHOCARDIOGRAM (TEE);  Surgeon: Prescott Gum, Collier Salina, MD;  Location: [place];  Service: Open Heart Surgery;  Laterality: N/A;   TOTAL KNEE ARTHROPLASTY Right 06/24/2016   Procedure: TOTAL KNEE ARTHROPLASTY;  Surgeon: Garald Balding, MD;  Location: [place];  Service: Orthopedics;  Laterality: Right;   TOTAL KNEE ARTHROPLASTY Left 12/18/2020   Procedure: LEFT TOTAL KNEE ARTHROPLASTY;  Surgeon: Garald Balding, MD;  Location: [facility];  Service: Orthopedics;  Laterality: Left;   TRIGGER FINGER RELEASE Left 11/10/2013   Procedure: LEFT INDEX A-1 PULLEY RELEASE;  Surgeon: Cammie Sickle, MD;  Location: [place];  Service: Orthopedics;  Laterality: Left;   ULNAR NERVE TRANSPOSITION Left 11/10/2013   Procedure:  ULNAR NERVE TRANSPOSITION;  Surgeon: Cammie Sickle, MD;  Location: [place];  Service: Orthopedics;  Laterality: Left;    There were no vitals filed for this visit.   Subjective Assessment - 02/13/21 1425     Subjective knee is a little sore due to weather. 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 Yes    Pain Score 5     Pain  Location Knee    Pain Orientation Left    Pain Descriptors / Indicators Aching;Sore    Pain Onset More than a month ago    Pain Frequency Constant    Aggravating Factors  weightbearing, static positioning    Pain Relieving Factors repositioning, ice, exercises                [facility] PT Assessment - 02/13/21 1440       Assessment   Medical Diagnosis Left Total Knee Arthroplasty  M25.003 (ICD-10-CM)    Referring Provider (PT) Joni Fears, MD    Onset Date/Surgical Date 12/18/20    Next MD Visit 03/28/21      Observation/Other Assessments   Focus on Therapeutic Outcomes (FOTO)  67      AROM   Left Knee Extension -4    Left Knee Flexion 110   95 standing     PROM   Left Knee Extension 0    Left Knee Flexion 115      Ambulation/Gait   Gait Comments amb independently negotiating stairs (1 handrail to descend reciprocally) and ramp/curb                           [facility] Adult PT Treatment/Exercise - 02/13/21 1432       Knee/Hip Exercises: Aerobic   Recumbent Bike L6 x 8 min      Knee/Hip Exercises: Machines for Strengthening   Total Gym Leg Press 150# bil push 3x10; LLE only 75# 3x10                       PT Short Term Goals - 02/13/21 1458       PT SHORT TERM GOAL #1   Title Patient is independent with initial HEP    Time 4    Period Weeks    Status Achieved    Target Date 02/01/21      PT SHORT TERM GOAL #2   Title PROM left knee -5* extension to 100* flexion    Time 4    Period Weeks    Status Achieved    Target Date 02/01/21               PT Long Term Goals - 02/13/21 1458       PT LONG TERM GOAL #1   Title FOTO >/= 53%    Time 8    Period Weeks    Status Achieved      PT LONG TERM GOAL #2   Title left knee AROM -5* seated to 95* flexion standing    Time 8    Period Weeks    Status Achieved      PT LONG TERM GOAL #3   Title left knee PROM 0 - 105    Time 8  Period Weeks    Status Achieved       PT LONG TERM GOAL #4   Title Patient ambulates 500' & negotiates ramps, curbs & stairs single rail without assistive device independently.    Time 8    Period Weeks    Status Achieved      PT LONG TERM GOAL #5   Title Patient demonstrates ability to perform light plumbing activities including getting on/off floor and lifting / carrying 20# items.    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    Time 8    Period Weeks    Status Achieved                   Plan - 02/13/21 1459     Clinical Impression Statement Pt has met all goals and is ready for d/c from PT.  Encouraged continued exercises to maximize rehab gains.  Will d/c PT today.    Personal Factors and Comorbidities Comorbidity 3+;Fitness    Comorbidities CABG 05/01/2020, microdiscectomy L2-3, rt TKA, Lt carpal tunnel release, arthritis, CAD, HTN, sciatica    Examination-Activity Limitations Lift;Carry;Locomotion Level;Squat;Stairs;Stand    Examination-Participation Restrictions Community Activity    Stability/Clinical Decision Making Stable/Uncomplicated    Rehab Potential Good    PT Frequency 2x / week    PT Duration 8 weeks    PT Treatment/Interventions ADLs/Self Care Home Management;Cryotherapy;Electrical Stimulation;Moist Heat;Gait training;Stair training;Functional mobility training;Therapeutic activities;DME Instruction;Therapeutic exercise;Neuromuscular re-education;Balance training;Patient/family education;Manual techniques;Scar mobilization;Passive range of motion;Vasopneumatic Device;Joint Manipulations    PT Next Visit Plan d/c  PT today    PT Home Exercise Plan Access Code: [email or web address]    Consulted and Agree with Plan of Care Patient             Patient will benefit from skilled therapeutic intervention in order to improve the  following deficits and impairments:  Abnormal gait, Cardiopulmonary status limiting activity, Decreased activity tolerance, Decreased balance, Decreased coordination, Decreased endurance, Decreased knowledge of precautions, Decreased knowledge of use of DME, Decreased mobility, Decreased range of motion, Decreased skin integrity, Decreased scar mobility, Decreased strength, Difficulty walking, Increased edema, Impaired flexibility, Pain  Visit Diagnosis: Other abnormalities of gait and mobility  Muscle weakness (generalized)  Stiffness of left knee, not elsewhere classified  Acute pain of left knee  Localized edema  Unsteadiness on feet     Problem List Patient Active Problem List   Diagnosis Date Noted   Total knee replacement status, left 12/18/2020   Adhesive capsulitis of right shoulder 09/26/2020   Carpal tunnel syndrome, bilateral upper limbs 09/26/2020   Bilateral hand pain 09/20/2020   Chronic left shoulder pain 08/23/2020   Right arm pain 08/23/2020   Cubital tunnel syndrome on left 08/23/2020   Left ankle sprain 07/12/2020   Visit for wound check 06/11/2020   Change or removal of surgical wound dressing 05/24/2020   Pulmonary nodule, left 05/23/2020   Cellulitis 05/23/2020   S/P CABG x 2 05/02/2020   Coronary artery disease 05/01/2020   Pulmonary nodule 1 cm or greater in diameter 04/26/2020   Statin intolerance 04/20/2020   Coronary atherosclerosis of native coronary artery 04/18/2020   Abnormal cardiovascular stress test    Nonrheumatic aortic valve stenosis    Exertional dyspnea  Stable angina pectoris ([place])    Bicuspid aortic valve 04/06/2020   Primary osteoarthritis of left knee 11/08/2019   Bilateral primary osteoarthritis of knee 09/28/2019   HNP (herniated nucleus pulposus), lumbar 09/29/2017   Right carotid bruit 06/10/2017   Paresthesia 06/10/2017   Left wrist pain 10/09/2016   Unilateral primary osteoarthritis, right knee 06/24/2016   S/P total  knee replacement using cement, right 06/24/2016   Acute pain of left knee 03/27/2016   Mixed hyperlipidemia 01/18/2007   HYPERTENSION 01/18/2007     Laureen Abrahams, PT, DPT 02/13/21 3:01 PM   [facility] [address] [place], [place], [address] Phone: [phone]   Fax:  [phone]  Name: JOHNPATRICK JENNY MRN: [id number] Date of Birth: [birth year]      PHYSICAL THERAPY DISCHARGE SUMMARY  Visits from Start of Care: 10  Current functional level related to goals / functional outcomes: See above   Remaining deficits: See above   Education / Equipment: HEP   Patient agrees to discharge. Patient goals were met. Patient is being discharged due to meeting the stated rehab goals.  Laureen Abrahams, PT, DPT 02/13/21 3:01 PM  [facility] [address] [place], [place], [address] Phone: [phone]   Fax:  [phone]

## 2021-02-18 ENCOUNTER — Encounter: Payer: BC Managed Care – PPO | Admitting: Physical Therapy

## 2021-02-20 ENCOUNTER — Encounter: Payer: BC Managed Care – PPO | Admitting: Physical Therapy

## 2021-03-06 ENCOUNTER — Telehealth: Payer: Self-pay | Admitting: Cardiology

## 2021-03-06 DIAGNOSIS — I25110 Atherosclerotic heart disease of native coronary artery with unstable angina pectoris: Secondary | ICD-10-CM

## 2021-03-06 MED ORDER — NEXLIZET 180-10 MG PO TABS: 1.0000 | ORAL_TABLET | Freq: Every day | ORAL | 3 refills | Status: DC

## 2021-03-06 NOTE — Telephone Encounter (Addendum)
Pt seen by PharmD 04/2020. Intolerant to at least 3 statins (pravastatin, rosuvastatin, atorvastatin), prescribed Repatha at that visit. Hadn't started as of 06/2020, pt had wanted to wait because of his CABG. Again advised to start [place] especially in setting of recent CABG. Pt sent MyChart message 09/2020 reporting rash and hip/ankle pain. He was advised to stop Repatha and his sx improved. Discussed trying either Praluent or Nexlizet but pt did not follow up with decision, now calling in 5 months later to discuss.  Spoke with pt, he is interested in trying Nexlizet. Prior auth approved through 03/05/22. Rx sent to pharmacy with copay card (ID [id number], BIN [id number], [employer] loyalty, [place] [id number]). Scheduled f/u labs in Dec to assess efficacy.

## 2021-03-06 NOTE — Telephone Encounter (Signed)
Pt spoke with someone from our office 6 months ago regarding the patient taking a new Cholesterol med. Pt said he was supposed to call the office back but he forgot until today. Please advise pt further

## 2021-03-17 ENCOUNTER — Other Ambulatory Visit: Payer: Self-pay | Admitting: Cardiology

## 2021-03-18 DIAGNOSIS — L814 Other melanin hyperpigmentation: Secondary | ICD-10-CM | POA: Diagnosis not present

## 2021-03-18 DIAGNOSIS — D225 Melanocytic nevi of trunk: Secondary | ICD-10-CM | POA: Diagnosis not present

## 2021-03-18 DIAGNOSIS — L821 Other seborrheic keratosis: Secondary | ICD-10-CM | POA: Diagnosis not present

## 2021-03-18 DIAGNOSIS — L570 Actinic keratosis: Secondary | ICD-10-CM | POA: Diagnosis not present

## 2021-03-18 DIAGNOSIS — L720 Epidermal cyst: Secondary | ICD-10-CM | POA: Diagnosis not present

## 2021-03-28 ENCOUNTER — Other Ambulatory Visit: Payer: Self-pay

## 2021-03-28 ENCOUNTER — Encounter: Payer: Self-pay | Admitting: Orthopaedic Surgery

## 2021-03-28 ENCOUNTER — Ambulatory Visit: Payer: BC Managed Care – PPO | Admitting: Orthopaedic Surgery

## 2021-03-28 DIAGNOSIS — Z96652 Presence of left artificial knee joint: Secondary | ICD-10-CM | POA: Diagnosis not present

## 2021-03-28 DIAGNOSIS — M170 Bilateral primary osteoarthritis of knee: Secondary | ICD-10-CM

## 2021-03-28 NOTE — Progress Notes (Signed)
Office Visit Note   Patient: Philip Winters           Date of Birth: [birth year]           MRN: [id number] Visit Date: 03/28/2021              Requested by: Antony Contras, MD [place],  [place] [address] PCP: Antony Contras, MD   Assessment & Plan: Visit Diagnoses:  1. Bilateral primary osteoarthritis of knee   2. Total knee replacement status, left     Plan: Just over 3 months s/p primary left total knee replacement and actually doing well.  Has minimal discomfort and not using any ambulatory aid.  He walks up to a mile a day and is riding a bike.  Range of motion is 0 to about 107 degrees using a goniometer and there is no instability.  He has considerable atrophy of his quads and hamstrings and needs to keep working on his strengthening.  He has had a prior right total knee replacement and has excellent strength.  He had very minimal effusion and no significant localized areas of tenderness.  Motor and sensory exam in tact.  Plan to see him back in 3 months  Follow-Up Instructions: Return in about 3 months (around 06/28/2021).   Orders:  No orders of the defined types were placed in this encounter.  No orders of the defined types were placed in this encounter.     Procedures: No procedures performed   Clinical Data: No additional findings.   Subjective: Chief Complaint  Patient presents with   Left Knee - Follow-up    Left total knee arthroplasty 12/18/2020  Patient presents today for a two month follow up on his left knee. He had a left total knee arthroplasty on 12/18/2020. He is now 3 months out from surgery. He states that he is still having some pain and swelling.  HPI  Review of Systems   Objective: Vital Signs: There were no vitals taken for this visit.  Physical Exam Constitutional:      Appearance: He is well-developed.  Pulmonary:     Effort: Pulmonary effort is normal.  Skin:    General: Skin is warm and dry.  Neurological:      Mental Status: He is alert and oriented to person, place, and time.  Psychiatric:        Behavior: Behavior normal.    Ortho Exam left knee with minimal effusion.  Knee was no warmer than the right knee.  Range of motion is 0 to about 107 degrees with a goniometer.  No opening with varus or valgus stress.  Popliteal pain.  No calf pain.  Minimal ankle swelling-nonpitting.  Motor exam intact.  Has developed some psoriatic lesions on both lower extremities and being treated for this topically.  Considerable poor definition of the quads and hamstrings compared to the right  Specialty Comments:  No specialty comments available.  Imaging: No results found.   PMFS History: Patient Active Problem List   Diagnosis Date Noted   Total knee replacement status, left 12/18/2020   Adhesive capsulitis of right shoulder 09/26/2020   Carpal tunnel syndrome, bilateral upper limbs 09/26/2020   Bilateral hand pain 09/20/2020   Chronic left shoulder pain 08/23/2020   Right arm pain 08/23/2020   Cubital tunnel syndrome on left 08/23/2020   Left ankle sprain 07/12/2020   Visit for wound check 06/11/2020   Change or removal of surgical wound  dressing 05/24/2020   Pulmonary nodule, left 05/23/2020   Cellulitis 05/23/2020   S/P CABG x 2 05/02/2020   Coronary artery disease 05/01/2020   Pulmonary nodule 1 cm or greater in diameter 04/26/2020   Statin intolerance 04/20/2020   Coronary atherosclerosis of native coronary artery 04/18/2020   Abnormal cardiovascular stress test    Nonrheumatic aortic valve stenosis    Exertional dyspnea    Stable angina pectoris ([facility])    Bicuspid aortic valve 04/06/2020   Primary osteoarthritis of left knee 11/08/2019   Bilateral primary osteoarthritis of knee 09/28/2019   HNP (herniated nucleus pulposus), lumbar 09/29/2017   Right carotid bruit 06/10/2017   Paresthesia 06/10/2017   Left wrist pain 10/09/2016   Unilateral primary osteoarthritis, right knee 06/24/2016    S/P total knee replacement using cement, right 06/24/2016   Acute pain of left knee 03/27/2016   Mixed hyperlipidemia 01/18/2007   HYPERTENSION 01/18/2007   Past Medical History:  Diagnosis Date   Allergy    Arthritis    Coronary artery disease    GERD (gastroesophageal reflux disease)    occ   Heart murmur    baby   High cholesterol    Hypertension    Pneumonia    Right carotid bruit 06/10/2017   Sciatica    Seasonal allergies    Sinus tachycardia    Spinal stenosis     History reviewed. No pertinent family history.  Past Surgical History:  Procedure Laterality Date   CARDIAC CATHETERIZATION     CARPAL TUNNEL WITH CUBITAL TUNNEL Left 11/10/2013   Procedure: LEFT CARPAL TUNNEL RELEASE;  Surgeon: Cammie Sickle, MD;  Location: [place];  Service: Orthopedics;  Laterality: Left;   COLONOSCOPY     CORONARY ARTERY BYPASS GRAFT N/A 05/01/2020   Procedure: CORONARY ARTERY BYPASS GRAFTING (CABG) X 2 ON CARDIOPULMONARY BYPASS. LIMA TO LAD, SVG TO DIAG;  Surgeon: Ivin Poot, MD;  Location: [place];  Service: Open Heart Surgery;  Laterality: N/A;   ENDOVEIN HARVEST OF GREATER SAPHENOUS VEIN Right 05/01/2020   Procedure: ENDOVEIN HARVEST OF GREATER SAPHENOUS VEIN;  Surgeon: Ivin Poot, MD;  Location: [place];  Service: Open Heart Surgery;  Laterality: Right;   KNEE ARTHROSCOPY  [id number]   left and right   LUMBAR LAMINECTOMY/DECOMPRESSION MICRODISCECTOMY Left 09/29/2017   Procedure: LEFT LUMBAR TWO - LUMBAR THREELAMINOTOMY/MICRODISCECTOMY;  Surgeon: Jovita Gamma, MD;  Location: [place];  Service: Neurosurgery;  Laterality: Left;  LEFT LUMBAR 2- LUMBAR 3 LAMINOTOMY/MICRODISCECTOMY   NASAL SINUS SURGERY  05/2015   RIGHT/LEFT HEART CATH AND CORONARY ANGIOGRAPHY N/A 04/10/2020   Procedure: RIGHT/LEFT HEART CATH AND CORONARY ANGIOGRAPHY;  Surgeon: Troy Sine, MD;  Location: [facility];  Service: Cardiovascular;  Laterality: N/A;   TEE WITHOUT  CARDIOVERSION N/A 05/01/2020   Procedure: TRANSESOPHAGEAL ECHOCARDIOGRAM (TEE);  Surgeon: Prescott Gum, Collier Salina, MD;  Location: [place];  Service: Open Heart Surgery;  Laterality: N/A;   TOTAL KNEE ARTHROPLASTY Right 06/24/2016   Procedure: TOTAL KNEE ARTHROPLASTY;  Surgeon: Garald Balding, MD;  Location: [place];  Service: Orthopedics;  Laterality: Right;   TOTAL KNEE ARTHROPLASTY Left 12/18/2020   Procedure: LEFT TOTAL KNEE ARTHROPLASTY;  Surgeon: Garald Balding, MD;  Location: [facility];  Service: Orthopedics;  Laterality: Left;   TRIGGER FINGER RELEASE Left 11/10/2013   Procedure: LEFT INDEX A-1 PULLEY RELEASE;  Surgeon: Cammie Sickle, MD;  Location: [place];  Service: Orthopedics;  Laterality: Left;  ULNAR NERVE TRANSPOSITION Left 11/10/2013   Procedure: ULNAR NERVE TRANSPOSITION;  Surgeon: Cammie Sickle, MD;  Location: [place];  Service: Orthopedics;  Laterality: Left;   Social History   Occupational History   Occupation: Self employed  Tobacco Use   Smoking status: Former    Packs/day: 1.00    Years: 37.00    Pack years: 37.00    Types: Cigarettes    Quit date: 05/04/2016    Years since quitting: 4.9   Smokeless tobacco: Never  Vaping Use   Vaping Use: Former  Substance and Sexual Activity   Alcohol use: Yes    Alcohol/week: 3.0 standard drinks    Types: 3 Glasses of wine per week    Comment: weekends only with dinner   Drug use: No   Sexual activity: Not on file

## 2021-04-16 ENCOUNTER — Encounter: Payer: Self-pay | Admitting: *Deleted

## 2021-04-17 ENCOUNTER — Ambulatory Visit: Payer: BC Managed Care – PPO | Admitting: Diagnostic Neuroimaging

## 2021-04-17 ENCOUNTER — Encounter: Payer: Self-pay | Admitting: Diagnostic Neuroimaging

## 2021-04-17 VITALS — BP 136/84 | HR 107 | Ht 68.0 in | Wt 152.0 lb

## 2021-04-17 DIAGNOSIS — R200 Anesthesia of skin: Secondary | ICD-10-CM

## 2021-04-17 DIAGNOSIS — M4807 Spinal stenosis, lumbosacral region: Secondary | ICD-10-CM

## 2021-04-17 MED ORDER — PREGABALIN 50 MG PO CAPS: 50.0000 mg | ORAL_CAPSULE | Freq: Two times a day (BID) | ORAL | 5 refills | Status: DC

## 2021-04-17 NOTE — Progress Notes (Signed)
[facility]  PATIENT: Philip Winters DOB: 01/22/1961  REFERRING CLINICIAN: Antony Contras, MD HISTORY FROM: patient  REASON FOR VISIT: new consult    HISTORICAL  CHIEF COMPLAINT:  Chief Complaint  Patient presents with   New Patient (Initial Visit)    Rm 1 alone here for consult on worsening bilateral neuropathy like symptoms in both feet.     HISTORY OF PRESENT ILLNESS:   60 year old male here for evaluation of numbness in toes and heel pain.  Patient has history of lumbar degenerative disease status post discectomy in 2018 with good results.  He has had bilateral knee replacements.  Most recently this was left knee replacement in August 2022.  In September 2020 he noticed numbness in his left and then right anterior toes and plantar regions.  Over time he developed some bilateral heel pain.  Symptoms worse later in the day when he is standing and walking.    REVIEW OF SYSTEMS: Full 14 system review of systems performed and negative with exception of: as per HPI.  ALLERGIES: Allergies  Allergen Reactions   Mushroom Extract Complex Anaphylaxis   Penicillins Shortness Of Breath    Childhood allergy.  Has patient had a PCN reaction causing immediate rash, facial/tongue/throat swelling, SOB or lightheadedness with hypotension: No Has patient had a PCN reaction causing severe rash involving mucus membranes or skin necrosis: No Has patient had a PCN reaction that required hospitalization No Has patient had a PCN reaction occurring within the last 10 years: No If all of the above answers are "NO", then may proceed with C   Atorvastatin Other (See Comments)    myalgia   Dilaudid [Hydromorphone] Nausea And Vomiting   Losartan Potassium Other (See Comments)    Lethargic    Omega-3-Acid Ethyl Esters     Other reaction(s): myalgia   Oxycodone Hcl     Other reaction(s): hallucinations   Pravastatin Other (See Comments)    myalgia   Red Dye     Other  reaction(s): rash   Rosuvastatin Other (See Comments)    myalgia   Varenicline     Other reaction(s): headaches Other reaction(s): headaches   Bystolic [Nebivolol Hcl] Other (See Comments)    Extreme fatigue, not able to focus   Codeine Nausea And Vomiting   Iodine Rash   Iohexol Rash   Metoprolol Other (See Comments)    Made patient very fatigued, could not focus   Repatha [Evolocumab] Rash and Other (See Comments)    Hip pain and rash    HOME MEDICATIONS: Outpatient Medications Prior to Visit  Medication Sig Dispense Refill   acetaminophen (TYLENOL) 325 MG tablet Take 2 tablets (650 mg total) by mouth every 6 (six) hours as needed for mild pain or moderate pain (pain score 1-3 or temp > 100.5). 100 tablet 0   aspirin EC 81 MG tablet Take 1 tablet (81 mg total) by mouth 2 (two) times daily after a meal. Swallow whole. 90 tablet 3   Bempedoic Acid-Ezetimibe (NEXLIZET) 180-10 MG TABS Take 1 tablet by mouth daily. 90 tablet 3   diphenhydrAMINE (BENADRYL) 25 MG tablet Take 12.5 mg by mouth at bedtime.     fluticasone (FLONASE) 50 MCG/ACT nasal spray Place 2 sprays into both nostrils daily as needed for allergies.      Multiple Vitamin (MULTIVITAMIN WITH MINERALS) TABS tablet Take 1 tablet by mouth in the morning.     ondansetron (ZOFRAN ODT) 4 MG disintegrating tablet Take 1 tablet (4  mg total) by mouth every 8 (eight) hours as needed for nausea or vomiting. 20 tablet 0   Polyethyl Glycol-Propyl Glycol (LUBRICANT EYE DROPS) 0.4-0.3 % SOLN Place 1 drop into both eyes 3 (three) times daily as needed (dry/irritated eyes).     diltiazem (CARDIZEM CD) 240 MG 24 hr capsule TAKE 1 CAPSULE(240 MG) BY MOUTH DAILY (Patient not taking: Reported on 04/17/2021) 90 capsule 3   methocarbamol (ROBAXIN) 500 MG tablet Take 1 tablet (500 mg total) by mouth every 8 (eight) hours as needed for muscle spasms. (Patient not taking: Reported on 04/17/2021) 40 tablet 0   oxyCODONE (OXY IR/ROXICODONE) 5 MG immediate  release tablet Take 1 tablet (5 mg total) by mouth every 4 (four) hours as needed for moderate pain (pain score 4-6). (Patient not taking: Reported on 04/17/2021) 50 tablet 0   oxyCODONE-acetaminophen (PERCOCET/ROXICET) 5-325 MG tablet Take 1 tablet by mouth every 4 (four) hours as needed for severe pain. (Patient not taking: Reported on 04/17/2021) 30 tablet 0   oxyCODONE-acetaminophen (PERCOCET/ROXICET) 5-325 MG tablet Take 1 tablet by mouth every 4 (four) hours as needed for severe pain. (Patient not taking: Reported on 04/17/2021) 30 tablet 0   oxyCODONE (Oxy IR/ROXICODONE) immediate release tablet 5 mg      No facility-administered medications prior to visit.    PAST MEDICAL HISTORY: Past Medical History:  Diagnosis Date   Allergy    Arthritis    CAD (coronary artery disease)    Coronary artery disease    GERD (gastroesophageal reflux disease)    occ   Heart murmur    baby   High cholesterol    Hypertension    Peripheral neuropathy    Pneumonia    Right carotid bruit 06/10/2017   Sciatica    Seasonal allergies    Sinus tachycardia    Spinal stenosis     PAST SURGICAL HISTORY: Past Surgical History:  Procedure Laterality Date   CARDIAC CATHETERIZATION     CARPAL TUNNEL WITH CUBITAL TUNNEL Left 11/10/2013   Procedure: LEFT CARPAL TUNNEL RELEASE;  Surgeon: Cammie Sickle, MD;  Location: [facility];  Service: Orthopedics;  Laterality: Left;   COLONOSCOPY     CORONARY ARTERY BYPASS GRAFT N/A 05/01/2020   Procedure: CORONARY ARTERY BYPASS GRAFTING (CABG) X 2 ON CARDIOPULMONARY BYPASS. LIMA TO LAD, SVG TO DIAG;  Surgeon: Ivin Poot, MD;  Location: [facility];  Service: Open Heart Surgery;  Laterality: N/A;   ENDOVEIN HARVEST OF GREATER SAPHENOUS VEIN Right 05/01/2020   Procedure: ENDOVEIN HARVEST OF GREATER SAPHENOUS VEIN;  Surgeon: Ivin Poot, MD;  Location: [facility];  Service: Open Heart Surgery;  Laterality: Right;   KNEE ARTHROSCOPY  [id number]   left  and right   LUMBAR LAMINECTOMY/DECOMPRESSION MICRODISCECTOMY Left 09/29/2017   Procedure: LEFT LUMBAR TWO - LUMBAR THREELAMINOTOMY/MICRODISCECTOMY;  Surgeon: Jovita Gamma, MD;  Location: [facility];  Service: Neurosurgery;  Laterality: Left;  LEFT LUMBAR 2- LUMBAR 3 LAMINOTOMY/MICRODISCECTOMY   NASAL SINUS SURGERY  05/2015   RIGHT/LEFT HEART CATH AND CORONARY ANGIOGRAPHY N/A 04/10/2020   Procedure: RIGHT/LEFT HEART CATH AND CORONARY ANGIOGRAPHY;  Surgeon: Troy Sine, MD;  Location: [facility];  Service: Cardiovascular;  Laterality: N/A;   TEE WITHOUT CARDIOVERSION N/A 05/01/2020   Procedure: TRANSESOPHAGEAL ECHOCARDIOGRAM (TEE);  Surgeon: Prescott Gum, Collier Salina, MD;  Location: [facility];  Service: Open Heart Surgery;  Laterality: N/A;   TOTAL KNEE ARTHROPLASTY Right 06/24/2016   Procedure: TOTAL KNEE ARTHROPLASTY;  Surgeon: Durward Fortes,  Vonna Kotyk, MD;  Location: [facility];  Service: Orthopedics;  Laterality: Right;   TOTAL KNEE ARTHROPLASTY Left 12/18/2020   Procedure: LEFT TOTAL KNEE ARTHROPLASTY;  Surgeon: Garald Balding, MD;  Location: [facility];  Service: Orthopedics;  Laterality: Left;   TRIGGER FINGER RELEASE Left 11/10/2013   Procedure: LEFT INDEX A-1 PULLEY RELEASE;  Surgeon: Cammie Sickle, MD;  Location: [facility];  Service: Orthopedics;  Laterality: Left;   ULNAR NERVE TRANSPOSITION Left 11/10/2013   Procedure: ULNAR NERVE TRANSPOSITION;  Surgeon: Cammie Sickle, MD;  Location: [facility];  Service: Orthopedics;  Laterality: Left;    FAMILY HISTORY: Family History  Problem Relation Age of Onset   Hypertension Mother    Hypertension Father    Cancer Father     SOCIAL HISTORY: Social History   Socioeconomic History   Marital status: Married    Spouse name: [name]   Number of children: 0   Years of education: 12   Highest education level: Not on file  Occupational History   Occupation: Self employed    Comment: plumber  Tobacco Use    Smoking status: Former    Packs/day: 1.00    Years: 37.00    Pack years: 37.00    Types: Cigarettes    Quit date: 05/04/2016    Years since quitting: 4.9   Smokeless tobacco: Never  Vaping Use   Vaping Use: Former  Substance and Sexual Activity   Alcohol use: Yes    Alcohol/week: 3.0 standard drinks    Types: 3 Glasses of wine per week    Comment: weekends only with dinner   Drug use: No   Sexual activity: Not on file  Other Topics Concern   Not on file  Social History Narrative   Lives with wife, [name]   Caffeine use: 1 Coffee daily   Right handed    Social Determinants of Health   Financial Resource Strain: Not on file  Food Insecurity: Not on file  Transportation Needs: Not on file  Physical Activity: Not on file  Stress: Not on file  Social Connections: Not on file  Intimate Partner Violence: Not on file     PHYSICAL EXAM  GENERAL EXAM/CONSTITUTIONAL: Vitals:  Vitals:   04/17/21 1409  BP: 136/84  Pulse: (!) 107  SpO2: 97%  Weight: 152 lb (68.9 kg)  Height: 5\' 8"  (1.727 m)   Body mass index is 23.11 kg/m. Wt Readings from Last 3 Encounters:  04/17/21 152 lb (68.9 kg)  01/30/21 153 lb (69.4 kg)  01/01/21 153 lb (69.4 kg)   Patient is in no distress; well developed, nourished and groomed; neck is supple  CARDIOVASCULAR: Examination of carotid arteries is normal; no carotid bruits Regular rate and rhythm, BLOWING SYSTOLIC MURMUR Examination of peripheral vascular system by observation and palpation is normal  EYES: Ophthalmoscopic exam of optic discs and posterior segments is normal; no papilledema or hemorrhages No results found.  MUSCULOSKELETAL: Gait, strength, tone, movements noted in Neurologic exam below  NEUROLOGIC: MENTAL STATUS:  No flowsheet data found. awake, alert, oriented to person, place and time recent and remote memory intact normal attention and concentration language fluent, comprehension intact, naming intact fund of  knowledge appropriate  CRANIAL NERVE:  2nd - no papilledema on fundoscopic exam 2nd, 3rd, 4th, 6th - pupils equal and reactive to light, visual fields full to confrontation, extraocular muscles intact, no nystagmus 5th - facial sensation symmetric 7th - facial strength symmetric 8th -  hearing intact 9th - palate elevates symmetrically, uvula midline 11th - shoulder shrug symmetric 12th - tongue protrusion midline  MOTOR:  normal bulk and tone, full strength in the BUE, BLE  SENSORY:  normal and symmetric to light touch, pinprick, temperature, vibration; EXCEPT DECR IN TOES / BOTTOM OF FEET  COORDINATION:  finger-nose-finger, fine finger movements normal  REFLEXES:  deep tendon reflexes trace and symmetric; ABSENT AT ANKLES  GAIT/STATION:  narrow based gait     DIAGNOSTIC DATA (LABS, IMAGING, TESTING) - I reviewed patient records, labs, notes, testing and imaging myself where available.  Lab Results  Component Value Date   WBC 6.9 12/05/2020   HGB 14.7 12/05/2020   HCT 43.8 12/05/2020   MCV 93.6 12/05/2020   PLT 259 12/05/2020      Component Value Date/Time   NA 136 12/19/2020 0259   NA 138 04/06/2020 0902   K 4.7 12/19/2020 0259   CL 104 12/19/2020 0259   CO2 26 12/19/2020 0259   GLUCOSE 176 (H) 12/19/2020 0259   BUN 9 12/19/2020 0259   BUN 12 04/06/2020 0902   CREATININE 0.42 (L) 12/19/2020 0259   CALCIUM 9.1 12/19/2020 0259   PROT 7.4 12/05/2020 1343   PROT 6.8 10/03/2020 0822   ALBUMIN 4.4 12/05/2020 1343   ALBUMIN 4.4 10/03/2020 0822   AST 30 12/05/2020 1343   ALT 22 12/05/2020 1343   ALKPHOS 150 (H) 12/05/2020 1343   BILITOT 0.7 12/05/2020 1343   BILITOT 0.3 10/03/2020 0822   GFRNONAA >60 12/19/2020 0259   GFRAA 115 04/06/2020 0902   Lab Results  Component Value Date   CHOL 105 10/03/2020   HDL 58 10/03/2020   LDLCALC 28 10/03/2020   TRIG 103 10/03/2020   CHOLHDL 1.8 10/03/2020   Lab Results  Component Value Date   HGBA1C 5.8 (H)  04/30/2020   No results found for: VITAMINB12 No results found for: TSH     ASSESSMENT AND PLAN  60 y.o. year old male here with:   Dx:  1. Numbness in feet   2. Spinal stenosis of lumbosacral region      PLAN:  foot numbness / pain (since Sept 2022) - ddx: lumbar spinal stenosis, neuropathy, plantar fasciitis - check MRI lumbar spine, EMG, neuropathy labs  - start pregabalin 50mg  twice a day for nerve pain  HEEL PAIN - consider podiatry / sports medicine for plantar fasciitis eval  Orders Placed This Encounter  Procedures   MR LUMBAR SPINE WO CONTRAST   TSH   SPEP with IFE   ANA w/Reflex   SSA, SSB   Vitamin B12   A1c   NCV with EMG(electromyography)   Meds ordered this encounter  Medications   pregabalin (LYRICA) 50 MG capsule    Sig: Take 1 capsule (50 mg total) by mouth 2 (two) times daily.    Dispense:  60 capsule    Refill:  5   Return for for NCV/EMG.    Penni Bombard, MD 38/93/7342, 8:76 PM Certified in Neurology, Neurophysiology and Neuroimaging  [facility] [address] [place], [place] [address] [phone]

## 2021-04-17 NOTE — Patient Instructions (Signed)
Heel pain / foot numbness - ddx: lumbar spinal stenosis, neuropathy, plantar fasciitis - check MRI lumbar spine, EMG, labs  - pregabalin 50mg  twice a day for nerve pain - consider podiatry / sports medicine for plantar fasciitis eval

## 2021-04-18 ENCOUNTER — Encounter: Payer: Self-pay | Admitting: Diagnostic Neuroimaging

## 2021-04-22 ENCOUNTER — Other Ambulatory Visit: Payer: BC Managed Care – PPO

## 2021-04-22 LAB — HEMOGLOBIN A1C
Est. average glucose Bld gHb Est-mCnc: 117 mg/dL
Hgb A1c MFr Bld: 5.7 % — ABNORMAL HIGH (ref 4.8–5.6)

## 2021-04-22 LAB — MULTIPLE MYELOMA PANEL, SERUM
Albumin SerPl Elph-Mcnc: 4.2 g/dL (ref 2.9–4.4)
Albumin/Glob SerPl: 1.4 (ref 0.7–1.7)
Alpha 1: 0.3 g/dL (ref 0.0–0.4)
Alpha2 Glob SerPl Elph-Mcnc: 0.7 g/dL (ref 0.4–1.0)
B-Globulin SerPl Elph-Mcnc: 1.1 g/dL (ref 0.7–1.3)
Gamma Glob SerPl Elph-Mcnc: 1.1 g/dL (ref 0.4–1.8)
Globulin, Total: 3.2 g/dL (ref 2.2–3.9)
IgA/Immunoglobulin A, Serum: 221 mg/dL (ref 61–437)
IgG (Immunoglobin G), Serum: 1033 mg/dL (ref 603–1613)
IgM (Immunoglobulin M), Srm: 197 mg/dL — ABNORMAL HIGH (ref 20–172)
Total Protein: 7.4 g/dL (ref 6.0–8.5)

## 2021-04-22 LAB — ANA W/REFLEX: ANA Titer 1: NEGATIVE

## 2021-04-22 LAB — SJOGREN'S SYNDROME ANTIBODS(SSA + SSB)
ENA SSA (RO) Ab: 0.2 AI (ref 0.0–0.9)
ENA SSB (LA) Ab: 0.4 AI (ref 0.0–0.9)

## 2021-04-22 LAB — TSH: TSH: 1.43 u[IU]/mL (ref 0.450–4.500)

## 2021-04-22 LAB — VITAMIN B12: Vitamin B-12: 787 pg/mL (ref 232–1245)

## 2021-04-23 NOTE — Addendum Note (Signed)
Addended by: Jonasia Coiner E on: 04/23/2021 09:30 AM   Modules accepted: Orders

## 2021-04-24 ENCOUNTER — Encounter: Payer: Self-pay | Admitting: *Deleted

## 2021-04-24 ENCOUNTER — Other Ambulatory Visit: Payer: BC Managed Care – PPO | Admitting: *Deleted

## 2021-04-24 ENCOUNTER — Telehealth: Payer: Self-pay | Admitting: Diagnostic Neuroimaging

## 2021-04-24 ENCOUNTER — Other Ambulatory Visit: Payer: Self-pay

## 2021-04-24 DIAGNOSIS — I25110 Atherosclerotic heart disease of native coronary artery with unstable angina pectoris: Secondary | ICD-10-CM | POA: Diagnosis not present

## 2021-04-24 LAB — HEPATIC FUNCTION PANEL
ALT: 17 IU/L (ref 0–44)
AST: 25 IU/L (ref 0–40)
Albumin: 4.8 g/dL (ref 3.8–4.8)
Alkaline Phosphatase: 135 IU/L — ABNORMAL HIGH (ref 44–121)
Bilirubin Total: 0.6 mg/dL (ref 0.0–1.2)
Bilirubin, Direct: 0.23 mg/dL (ref 0.00–0.40)
Total Protein: 7.4 g/dL (ref 6.0–8.5)

## 2021-04-24 LAB — LIPID PANEL
Chol/HDL Ratio: 3.3 ratio (ref 0.0–5.0)
Cholesterol, Total: 176 mg/dL (ref 100–199)
HDL: 54 mg/dL (ref >39)
LDL Chol Calc (NIH): 107 mg/dL — ABNORMAL HIGH (ref 0–99)
Triglycerides: 83 mg/dL (ref 0–149)
VLDL Cholesterol Cal: 15 mg/dL (ref 5–40)

## 2021-04-24 NOTE — Telephone Encounter (Signed)
i spoke to the patient he states he will get back to me he is going to call his insurance and find the cheapest place to go [place]: [id number] (exp. 04/23/21 to 05/22/21)

## 2021-04-25 ENCOUNTER — Telehealth: Payer: Self-pay | Admitting: Pharmacist

## 2021-04-25 DIAGNOSIS — I25110 Atherosclerotic heart disease of native coronary artery with unstable angina pectoris: Secondary | ICD-10-CM

## 2021-04-25 NOTE — Telephone Encounter (Signed)
Labs checked yesterday after starting Nexlizet. LDL is relatively unchanged from baseline of 113 at 107 yesterday (did also have another baseline in the 130s). Previously 28 earlier this year when pt took Repatha but he did not tolerate it (rash and hip/ankle pain). Also previously intolerant to 3 statins (pravastatin, rosuvastatin, and atorvastatin). LDL goal is < 70 due to hx of CABG.  Called pt to see if he is taking his Nexlizet regularly. He reports he has been adherent and has been taking it for the past month or so. Unclear why his LDL has not dropped the anticipated 40%.  Discussed continuing on current therapy and rechecking labs in another month to see if his #s improve or trying Praluent. He prefers to recheck labs in another month. If LDL still hasn't responded to Nexlizet at that time, will discuss trying Praluent.

## 2021-04-25 NOTE — Addendum Note (Signed)
Addended by: Lasondra Hodgkins E on: 04/25/2021 10:51 AM   Modules accepted: Orders

## 2021-05-19 HISTORY — PX: COLONOSCOPY: SHX174

## 2021-05-23 ENCOUNTER — Other Ambulatory Visit: Payer: Self-pay

## 2021-05-23 ENCOUNTER — Other Ambulatory Visit: Payer: BC Managed Care – PPO | Admitting: *Deleted

## 2021-05-23 DIAGNOSIS — I25110 Atherosclerotic heart disease of native coronary artery with unstable angina pectoris: Secondary | ICD-10-CM

## 2021-05-23 LAB — LIPID PANEL
Chol/HDL Ratio: 3.4 ratio (ref 0.0–5.0)
Cholesterol, Total: 167 mg/dL (ref 100–199)
HDL: 49 mg/dL (ref >39)
LDL Chol Calc (NIH): 101 mg/dL — ABNORMAL HIGH (ref 0–99)
Triglycerides: 90 mg/dL (ref 0–149)
VLDL Cholesterol Cal: 17 mg/dL (ref 5–40)

## 2021-05-24 ENCOUNTER — Telehealth: Payer: Self-pay | Admitting: Pharmacist

## 2021-05-24 DIAGNOSIS — I25110 Atherosclerotic heart disease of native coronary artery with unstable angina pectoris: Secondary | ICD-10-CM

## 2021-05-24 NOTE — Telephone Encounter (Signed)
Called pt to discuss lipid panel results. LDL still relatively unchanged (101 vs 107) after starting Nexlizet. Pt reports adherence to medication, unclear why he has not responded as anticipated. He is also intolerant to Repatha (hip pain and rash) and 3 statins (pravastatin, rosuvastatin, and atorvastatin - myalgias with all). LDL goal is < 70 due to hx of CABG.  Discussed trying lower dose of Praluent 75mg  Q2W to see if pt tolerates it better than Repatha. He did have excellent LDL reduction on PCSK9i therapy previously with LDL down to 28 on Repatha before he stopped it secondary to intolerances.  Pt is agreeable to try Praluent. Will submit prior authorization and follow up with pt once approved. He will stop his Nexlizet due to lack of response.

## 2021-05-27 DIAGNOSIS — M48062 Spinal stenosis, lumbar region with neurogenic claudication: Secondary | ICD-10-CM | POA: Diagnosis not present

## 2021-05-27 DIAGNOSIS — I10 Essential (primary) hypertension: Secondary | ICD-10-CM | POA: Diagnosis not present

## 2021-05-27 MED ORDER — PRALUENT 75 MG/ML ~~LOC~~ SOAJ: 1.0000 "pen " | SUBCUTANEOUS | 11 refills | Status: DC

## 2021-05-27 NOTE — Telephone Encounter (Signed)
Pt returned call and is in agreement with plan, f/u labs scheduled.

## 2021-05-27 NOTE — Telephone Encounter (Signed)
Praluent PA approved through 05/23/22. Rx sent to pharmacy along with $25/month copay card (ID [id number]). Left message for pt to make him aware. Will need to schedule follow up labs in ~2 months to assess efficacy.

## 2021-05-28 DIAGNOSIS — M48062 Spinal stenosis, lumbar region with neurogenic claudication: Secondary | ICD-10-CM | POA: Diagnosis not present

## 2021-05-30 ENCOUNTER — Ambulatory Visit
Admission: RE | Admit: 2021-05-30 | Discharge: 2021-05-30 | Disposition: A | Discharge status: Home / Self Care | Payer: BC Managed Care – PPO | Source: Ambulatory Visit | Attending: Diagnostic Neuroimaging | Admitting: Diagnostic Neuroimaging

## 2021-05-30 DIAGNOSIS — M4807 Spinal stenosis, lumbosacral region: Secondary | ICD-10-CM

## 2021-05-30 DIAGNOSIS — R200 Anesthesia of skin: Secondary | ICD-10-CM

## 2021-05-30 DIAGNOSIS — M48061 Spinal stenosis, lumbar region without neurogenic claudication: Secondary | ICD-10-CM | POA: Diagnosis not present

## 2021-06-04 ENCOUNTER — Encounter: Payer: Self-pay | Admitting: Internal Medicine

## 2021-06-04 ENCOUNTER — Other Ambulatory Visit: Payer: Self-pay

## 2021-06-04 ENCOUNTER — Ambulatory Visit: Payer: BC Managed Care – PPO | Admitting: Internal Medicine

## 2021-06-04 VITALS — BP 154/90 | HR 112 | Temp 98.4°F | Resp 16 | Ht 68.0 in | Wt 153.0 lb

## 2021-06-04 DIAGNOSIS — R000 Tachycardia, unspecified: Secondary | ICD-10-CM

## 2021-06-04 DIAGNOSIS — B3742 Candidal balanitis: Secondary | ICD-10-CM

## 2021-06-04 DIAGNOSIS — I10 Essential (primary) hypertension: Secondary | ICD-10-CM

## 2021-06-04 DIAGNOSIS — L309 Dermatitis, unspecified: Secondary | ICD-10-CM | POA: Diagnosis not present

## 2021-06-04 DIAGNOSIS — L858 Other specified epidermal thickening: Secondary | ICD-10-CM | POA: Diagnosis not present

## 2021-06-04 DIAGNOSIS — Z23 Encounter for immunization: Secondary | ICD-10-CM | POA: Diagnosis not present

## 2021-06-04 DIAGNOSIS — Z1211 Encounter for screening for malignant neoplasm of colon: Secondary | ICD-10-CM

## 2021-06-04 LAB — BASIC METABOLIC PANEL
BUN: 11 mg/dL (ref 6–23)
CO2: 29 mEq/L (ref 19–32)
Calcium: 9.7 mg/dL (ref 8.4–10.5)
Chloride: 101 mEq/L (ref 96–112)
Creatinine, Ser: 0.67 mg/dL (ref 0.40–1.50)
GFR: 100.72 mL/min (ref >60.00)
Glucose, Bld: 105 mg/dL — ABNORMAL HIGH (ref 70–99)
Potassium: 4.2 mEq/L (ref 3.5–5.1)
Sodium: 138 mEq/L (ref 135–145)

## 2021-06-04 LAB — CBC WITH DIFFERENTIAL/PLATELET
Basophils Absolute: 0.1 10*3/uL (ref 0.0–0.1)
Basophils Relative: 1.5 % (ref 0.0–3.0)
Eosinophils Absolute: 0.2 10*3/uL (ref 0.0–0.7)
Eosinophils Relative: 3.4 % (ref 0.0–5.0)
HCT: 42.8 % (ref 39.0–52.0)
Hemoglobin: 14.4 g/dL (ref 13.0–17.0)
Lymphocytes Relative: 22.4 % (ref 12.0–46.0)
Lymphs Abs: 1.4 10*3/uL (ref 0.7–4.0)
MCHC: 33.5 g/dL (ref 30.0–36.0)
MCV: 94.7 fl (ref 78.0–100.0)
Monocytes Absolute: 0.6 10*3/uL (ref 0.1–1.0)
Monocytes Relative: 8.9 % (ref 3.0–12.0)
Neutro Abs: 3.9 10*3/uL (ref 1.4–7.7)
Neutrophils Relative %: 63.8 % (ref 43.0–77.0)
Platelets: 288 10*3/uL (ref 150.0–400.0)
RBC: 4.52 Mil/uL (ref 4.22–5.81)
RDW: 12.2 % (ref 11.5–15.5)
WBC: 6.2 10*3/uL (ref 4.0–10.5)

## 2021-06-04 LAB — URINALYSIS, ROUTINE W REFLEX MICROSCOPIC
Bilirubin Urine: NEGATIVE
Hgb urine dipstick: NEGATIVE
Ketones, ur: NEGATIVE
Leukocytes,Ua: NEGATIVE
Nitrite: NEGATIVE
Specific Gravity, Urine: 1.01 (ref 1.000–1.030)
Total Protein, Urine: NEGATIVE
Urine Glucose: NEGATIVE
Urobilinogen, UA: 0.2 (ref 0.0–1.0)
pH: 8 (ref 5.0–8.0)

## 2021-06-04 MED ORDER — KETOCONAZOLE 2 % EX CREA: 1.0000 "application " | TOPICAL_CREAM | Freq: Two times a day (BID) | CUTANEOUS | 1 refills | Status: DC

## 2021-06-04 NOTE — Progress Notes (Signed)
Subjective:  Patient ID: Philip Winters, male    DOB: 1960-08-31  Age: 61 y.o. MRN: [id number]  CC: Hypertension  This visit occurred during the SARS-CoV-2 public health emergency.  Safety protocols were in place, including screening questions prior to the visit, additional usage of staff PPE, and extensive cleaning of exam room while observing appropriate contact time as indicated for disinfecting solutions.    HPI Philip Winters presents for establishing.   He complains that his blood pressure is not adequately well controlled.  He has chronic asymptomatic tachycardia.  He denies headache, blurred vision, chest pain, shortness of breath, dizziness, or lightheadedness.  He has had an asymptomatic rash on his right thigh and right lower leg.  He tells me he saw a dermatologist about 6 weeks ago and was prescribed betamethasone cream which is not helping.  He will see a dermatologist later today about the rash.  History Philip Winters has a past medical history of Allergy, Arthritis, CAD (coronary artery disease), Coronary artery disease, GERD (gastroesophageal reflux disease), Heart murmur, High cholesterol, Hypertension, Peripheral neuropathy, Pneumonia, Right carotid bruit (06/10/2017), Sciatica, Seasonal allergies, Sinus tachycardia, and Spinal stenosis.   He has a past surgical history that includes Knee arthroscopy (7782,4235); Colonoscopy; Carpal tunnel with cubital tunnel (Left, 11/10/2013); Trigger finger release (Left, 11/10/2013); Ulnar nerve transposition (Left, 11/10/2013); Nasal sinus surgery (05/2015); Total knee arthroplasty (Right, 06/24/2016); Lumbar laminectomy/decompression microdiscectomy (Left, 09/29/2017); RIGHT/LEFT HEART CATH AND CORONARY ANGIOGRAPHY (N/A, 04/10/2020); Coronary artery bypass graft (N/A, 05/01/2020); TEE without cardioversion (N/A, 05/01/2020); Endoharvest vein of greater saphenous vein (Right, 05/01/2020); Cardiac catheterization; and Total knee arthroplasty (Left,  12/18/2020).   His family history includes Cancer in his father; Hypertension in his father and mother.He reports that he quit smoking about 5 years ago. His smoking use included cigarettes. He has a 37.00 pack-year smoking history. He has never used smokeless tobacco. He reports current alcohol use of about 3.0 standard drinks per week. He reports that he does not use drugs.  Outpatient Medications Prior to Visit  Medication Sig Dispense Refill   acetaminophen (TYLENOL) 325 MG tablet Take 2 tablets (650 mg total) by mouth every 6 (six) hours as needed for mild pain or moderate pain (pain score 1-3 or temp > 100.5). 100 tablet 0   Alirocumab (PRALUENT) 75 MG/ML SOAJ Inject 1 pen into the skin every 14 (fourteen) days. 2 mL 11   aspirin EC 81 MG tablet Take 1 tablet (81 mg total) by mouth 2 (two) times daily after a meal. Swallow whole. 90 tablet 3   diphenhydrAMINE (BENADRYL) 25 MG tablet Take 12.5 mg by mouth at bedtime.     fluticasone (FLONASE) 50 MCG/ACT nasal spray Place 2 sprays into both nostrils daily as needed for allergies.      Multiple Vitamin (MULTIVITAMIN WITH MINERALS) TABS tablet Take 1 tablet by mouth in the morning.     ondansetron (ZOFRAN ODT) 4 MG disintegrating tablet Take 1 tablet (4 mg total) by mouth every 8 (eight) hours as needed for nausea or vomiting. 20 tablet 0   Polyethyl Glycol-Propyl Glycol (LUBRICANT EYE DROPS) 0.4-0.3 % SOLN Place 1 drop into both eyes 3 (three) times daily as needed (dry/irritated eyes).     pregabalin (LYRICA) 50 MG capsule Take 1 capsule (50 mg total) by mouth 2 (two) times daily. 60 capsule 5   No facility-administered medications prior to visit.    ROS Review of Systems  Constitutional:  Negative for chills, diaphoresis, fatigue  and fever.  HENT: Negative.    Eyes: Negative.   Respiratory:  Negative for cough, chest tightness, shortness of breath and wheezing.   Cardiovascular:  Negative for chest pain, palpitations and leg swelling.   Gastrointestinal:  Negative for abdominal pain, constipation, nausea and vomiting.  Endocrine: Negative.   Genitourinary: Negative.  Negative for difficulty urinating and hematuria.  Musculoskeletal:  Positive for arthralgias and back pain. Negative for myalgias.  Skin:  Positive for color change and rash.  Neurological:  Negative for dizziness, weakness, light-headedness, numbness and headaches.  Hematological:  Negative for adenopathy. Does not bruise/bleed easily.  Psychiatric/Behavioral: Negative.     Objective:  BP (!) 154/90 (BP Location: Right Arm, Patient Position: Sitting, Cuff Size: Large)    Pulse (!) 112    Temp 98.4 F (36.9 C) (Oral)    Resp 16    Ht 5\' 8"  (1.727 m)    Wt 153 lb (69.4 kg)    SpO2 97%    BMI 23.26 kg/m   Physical Exam Vitals reviewed.  Constitutional:      Appearance: Normal appearance.  HENT:     Nose: Nose normal.     Mouth/Throat:     Mouth: Mucous membranes are moist.  Eyes:     General: No scleral icterus.    Conjunctiva/sclera: Conjunctivae normal.  Cardiovascular:     Rate and Rhythm: Regular rhythm. Tachycardia present.     Heart sounds: S1 normal and S2 normal. Murmur heard.  Systolic (harsh) murmur is present with a grade of 1/6.  No diastolic murmur is present.    No friction rub. No gallop.  Pulmonary:     Effort: Pulmonary effort is normal.     Breath sounds: No stridor. No wheezing, rhonchi or rales.  Abdominal:     General: Abdomen is flat.     Tenderness: There is no abdominal tenderness. There is no guarding or rebound.     Hernia: No hernia is present.  Genitourinary:    Pubic Area: Rash present.     Penis: Circumcised. Erythema present. No tenderness, discharge, swelling or lesions.      Testes: Normal.     Epididymis:     Right: Normal. Not inflamed or enlarged. No mass.     Left: Normal. Not inflamed or enlarged. No mass.    Musculoskeletal:     Cervical back: Neck supple. No tenderness.     Right lower leg: No  edema.     Left lower leg: No edema.  Skin:    Findings: Lesion and rash present.  Neurological:     General: No focal deficit present.     Mental Status: He is alert. Mental status is at baseline.  Psychiatric:        Mood and Affect: Mood normal.        Behavior: Behavior normal.    Lab Results  Component Value Date   WBC 6.2 06/04/2021   HGB 14.4 06/04/2021   HCT 42.8 06/04/2021   PLT 288.0 06/04/2021   GLUCOSE 105 (H) 06/04/2021   CHOL 167 05/23/2021   TRIG 90 05/23/2021   HDL 49 05/23/2021   LDLCALC 101 (H) 05/23/2021   ALT 17 04/24/2021   AST 25 04/24/2021   NA 138 06/04/2021   K 4.2 06/04/2021   CL 101 06/04/2021   CREATININE 0.67 06/04/2021   BUN 11 06/04/2021   CO2 29 06/04/2021   TSH 1.430 04/17/2021   INR 1.2 05/03/2020   HGBA1C 5.7 (H)  04/17/2021     Assessment & Plan:   Veldon was seen today for hypertension.  Diagnoses and all orders for this visit:  Primary hypertension- His blood pressure is not adequately well controlled.  I will check labs to screen for secondary causes.  I recommend he start taking an ARB. -     Aldosterone + renin activity w/ ratio; Future -     Urinalysis, Routine w reflex microscopic; Future -     Basic metabolic panel; Future -     Metanephrines, plasma; Future -     CBC with Differential/Platelet; Future -     CBC with Differential/Platelet -     Metanephrines, plasma -     Basic metabolic panel -     Urinalysis, Routine w reflex microscopic -     Aldosterone + renin activity w/ ratio -     olmesartan (BENICAR) 20 MG tablet; Take 1 tablet (20 mg total) by mouth daily.  Chronic tachycardia- Will screen for pheo. -     Metanephrines, plasma; Future -     CBC with Differential/Platelet; Future -     CBC with Differential/Platelet -     Metanephrines, plasma  Candidal balanitis -     ketoconazole (NIZORAL) 2 % cream; Apply 1 application topically 2 (two) times daily.  Screen for colon cancer -     Ambulatory referral  to Gastroenterology   I am having Azekiel P. Tocco start on ketoconazole and olmesartan. I am also having him maintain his multivitamin with minerals, diphenhydrAMINE, fluticasone, Lubricant Eye Drops, acetaminophen, aspirin EC, ondansetron, pregabalin, and Praluent.  Meds ordered this encounter  Medications   ketoconazole (NIZORAL) 2 % cream    Sig: Apply 1 application topically 2 (two) times daily.    Dispense:  60 g    Refill:  1   olmesartan (BENICAR) 20 MG tablet    Sig: Take 1 tablet (20 mg total) by mouth daily.    Dispense:  90 tablet    Refill:  0     Follow-up: Return in about 3 months (around 09/02/2021).  Scarlette Calico, MD

## 2021-06-04 NOTE — Patient Instructions (Signed)

## 2021-06-05 MED ORDER — OLMESARTAN MEDOXOMIL 20 MG PO TABS: 20.0000 mg | ORAL_TABLET | Freq: Every day | ORAL | 0 refills | Status: DC

## 2021-06-06 ENCOUNTER — Telehealth: Payer: Self-pay | Admitting: Cardiology

## 2021-06-06 DIAGNOSIS — Z1211 Encounter for screening for malignant neoplasm of colon: Secondary | ICD-10-CM | POA: Insufficient documentation

## 2021-06-06 NOTE — Telephone Encounter (Signed)
Spoke with pt who reports Dr Ronnald Ramp started him on Olmesartan 20 mg daily yesterday after his office visit d/t HTN.  Pt reports he already take Diltiazem 240 mg once daily and wants to make sure it is OK to take both together.  Advised pt Diltiazem 240 mg had been removed from his medication list 04/17/2021 - reported by pt he was not taking.  Advised OK to take together and to continue to monitor BP at home.  Advised I will add diltiazem 240 mg back to his list since he has never discontinued it.  Will forward to Dr Marlou Porch for his knowledge

## 2021-06-06 NOTE — Telephone Encounter (Signed)
Pt c/o medication issue:  1. Name of Medication:  olmesartan (BENICAR) 20 MG tablet Diltiazem  240 mg  2. How are you currently taking this medication (dosage and times per day)? 1 tablet daily of diltiazem  3. Are you having a reaction (difficulty breathing--STAT)? no  4. What is your medication issue? Patient states his BP has been high so his PCP Dr. Ronnald Ramp at [facility] prescribed him olmesartan. He would like to know if it is okay for him to take it. He says his BP has been high, but last week he was having back pain and thought it was due to the pain, but now the back pain is gone. BP 150/90 day before yesterday. Back was hurting last week and thought that was why BP had been high, but no back pain now.

## 2021-06-10 ENCOUNTER — Encounter: Payer: Self-pay | Admitting: Gastroenterology

## 2021-06-10 LAB — METANEPHRINES, PLASMA
Metanephrine, Free: 27 pg/mL (ref <=57)
Normetanephrine, Free: 78 pg/mL (ref <=148)
Total Metanephrines-Plasma: 105 pg/mL (ref <=205)

## 2021-06-10 LAB — ALDOSTERONE + RENIN ACTIVITY W/ RATIO
ALDO / PRA Ratio: 4.2 Ratio (ref 0.9–28.9)
Aldosterone: 9 ng/dL
Renin Activity: 2.12 ng/mL/h (ref 0.25–5.82)

## 2021-06-11 ENCOUNTER — Encounter: Payer: Self-pay | Admitting: *Deleted

## 2021-06-13 ENCOUNTER — Ambulatory Visit (INDEPENDENT_AMBULATORY_CARE_PROVIDER_SITE_OTHER): Payer: BC Managed Care – PPO | Admitting: Diagnostic Neuroimaging

## 2021-06-13 ENCOUNTER — Encounter: Payer: BC Managed Care – PPO | Admitting: Diagnostic Neuroimaging

## 2021-06-13 DIAGNOSIS — R200 Anesthesia of skin: Secondary | ICD-10-CM

## 2021-06-13 DIAGNOSIS — Z0289 Encounter for other administrative examinations: Secondary | ICD-10-CM

## 2021-06-13 NOTE — Procedures (Signed)
[facility]  NCS (NERVE CONDUCTION STUDY) WITH EMG (ELECTROMYOGRAPHY) REPORT   STUDY DATE: 06/13/21 PATIENT NAME: Philip Winters DOB: July 19, 1960 MRN: [id number]  ORDERING CLINICIAN: Andrey Spearman, MD   TECHNOLOGIST: Sherre Scarlet ELECTROMYOGRAPHER: Earlean Polka. Susane Bey, MD  CLINICAL INFORMATION: 61 year old male with lower extremity numbness.  History of lumbar spine surgery.  FINDINGS: NERVE CONDUCTION STUDY:  Bilateral peroneal and left tibial motor responses are normal.  Right tibial motor response has normal distal latency, decreased amplitude and normal conduction velocity.  Bilateral sural and superficial peroneal sensory responses are normal.  Bilateral tibial F wave latencies are prolonged.   NEEDLE ELECTROMYOGRAPHY:  Needle examination of left lower extremity vastus medialis, tibialis anterior, gastrocnemius is normal.   IMPRESSION:   Abnormal study demonstrating: -No electrodiagnostic evidence of widespread underlying polyneuropathy. -Abnormal tibial F-wave latencies and right tibial motor response raise possibility of underlying lumbar radiculopathies.  Correlate clinically and with neuroimaging studies.   INTERPRETING PHYSICIAN:  Penni Bombard, MD Certified in Neurology, Neurophysiology and Neuroimaging  [facility] [address], [place], [place] [address] [phone]    [facility]    Nerve / Sites Muscle Latency Ref. Amplitude Ref. Rel Amp Segments Distance Velocity Ref. Area    ms ms mV mV %  cm m/s m/s mVms  L Peroneal - EDB     Ankle EDB 3.4 ?6.5 4.6 ?2.0 100 Ankle - EDB 9   13.3     Fib head EDB 9.6  4.1  89.1 Fib head - Ankle 27 44 ?44 14.6     Pop fossa EDB 11.9  4.0  98.8 Pop fossa - Fib head 10 44 ?44 12.9         Pop fossa - Ankle      R Peroneal - EDB     Ankle EDB 4.1 ?6.5 3.8 ?2.0 100 Ankle - EDB 9   11.6     Fib head EDB 10.4  3.2  84.1 Fib head - Ankle 28 45 ?44 12.9     Pop fossa EDB 12.6   2.7  85.8 Pop fossa - Fib head 10 45 ?44 12.7         Pop fossa - Ankle      L Tibial - AH     Ankle AH 4.2 ?5.8 5.5 ?4.0 100 Ankle - AH 9   10.6     Pop fossa AH 12.5  3.3  60.1 Pop fossa - Ankle 40 48 ?41 11.5  R Tibial - AH     Ankle AH 4.9 ?5.8 3.1 ?4.0 100 Ankle - AH 9   7.0     Pop fossa AH 14.2  2.4  75.7 Pop fossa - Ankle 38 41 ?41 4.7             SNC    Nerve / Sites Rec. Site Peak Lat Ref.  Amp Ref. Segments Distance    ms ms V V  cm  L Sural - Ankle (Calf)     Calf Ankle 3.3 ?4.4 10 ?6 Calf - Ankle 14  R Sural - Ankle (Calf)     Calf Ankle 3.5 ?4.4 7 ?6 Calf - Ankle 14  L Superficial peroneal - Ankle     Lat leg Ankle 3.5 ?4.4 7 ?6 Lat leg - Ankle 14  R Superficial peroneal - Ankle     Lat leg Ankle 3.8 ?4.4 6 ?6 Lat leg - Ankle 14  F  Wave    Nerve F Lat Ref.   ms ms  L Tibial - AH 59.3 ?56.0  R Tibial - AH 63.0 ?56.0         EMG Summary Table    Spontaneous MUAP Recruitment  Muscle IA Fib PSW Fasc Other Amp Dur. Poly Pattern  L. Vastus medialis Normal None None None _______ Normal Normal Normal Normal  L. Tibialis anterior Normal None None None _______ Normal Normal Normal Normal  L. Gastrocnemius (Medial head) Normal None None None _______ Normal Normal Normal Normal

## 2021-06-18 DIAGNOSIS — L309 Dermatitis, unspecified: Secondary | ICD-10-CM | POA: Diagnosis not present

## 2021-06-18 DIAGNOSIS — L858 Other specified epidermal thickening: Secondary | ICD-10-CM | POA: Diagnosis not present

## 2021-06-20 ENCOUNTER — Encounter: Payer: Self-pay | Admitting: Orthopaedic Surgery

## 2021-06-27 ENCOUNTER — Ambulatory Visit (INDEPENDENT_AMBULATORY_CARE_PROVIDER_SITE_OTHER): Payer: BC Managed Care – PPO | Admitting: Orthopaedic Surgery

## 2021-06-27 ENCOUNTER — Encounter: Payer: Self-pay | Admitting: Orthopaedic Surgery

## 2021-06-27 ENCOUNTER — Ambulatory Visit: Payer: Self-pay

## 2021-06-27 ENCOUNTER — Other Ambulatory Visit: Payer: Self-pay

## 2021-06-27 DIAGNOSIS — G8929 Other chronic pain: Secondary | ICD-10-CM

## 2021-06-27 DIAGNOSIS — M65332 Trigger finger, left middle finger: Secondary | ICD-10-CM | POA: Diagnosis not present

## 2021-06-27 DIAGNOSIS — M25562 Pain in left knee: Secondary | ICD-10-CM | POA: Diagnosis not present

## 2021-06-27 DIAGNOSIS — M65342 Trigger finger, left ring finger: Secondary | ICD-10-CM

## 2021-06-27 DIAGNOSIS — M170 Bilateral primary osteoarthritis of knee: Secondary | ICD-10-CM

## 2021-06-27 MED ORDER — LIDOCAINE HCL 1 % IJ SOLN
1.0000 mL | INTRAMUSCULAR | Status: AC | PRN
  Administered 2021-06-27: 1 mL

## 2021-06-27 MED ORDER — METHYLPREDNISOLONE ACETATE 40 MG/ML IJ SUSP
20.0000 mg | INTRAMUSCULAR | Status: AC | PRN
  Administered 2021-06-27: 20 mg

## 2021-06-27 NOTE — Progress Notes (Signed)
Office Visit Note   Patient: Philip Winters           Date of Birth: 1961-02-24           MRN: [id number] Visit Date: 06/27/2021              Requested by: Antony Contras, MD [place],  [place] [address] PCP: Janith Lima, MD   Assessment & Plan: Visit Diagnoses:  1. Chronic pain of left knee   2. Bilateral primary osteoarthritis of knee   3. Trigger finger, left middle finger     Plan: Philip Winters is 6 months status post left total knee replacement for the past month has had some discomfort.  His x-rays look fine today he does have significant significant atrophy of his left thigh musculature I think he needs to be working on strengthening exercises.  There is no instability.  Little if any effusion knee was not hot warm or red.  In addition he has active triggering of the left ring and long fingers.  Long discussion regarding the treatment options including surgical release.  He would like to try cortisone injection.  This was performed without difficulty  Follow-Up Instructions: Return in about 6 months (around 12/25/2021).   Orders:  Orders Placed This Encounter  Procedures   Hand/UE Inj: L long A1   Hand/UE Inj: L ring A1   XR KNEE 3 VIEW LEFT   No orders of the defined types were placed in this encounter.     Procedures: Hand/UE Inj: L long A1 for trigger finger on 06/27/2021 2:22 PM Indications: pain Details: 27 G needle, volar approach Medications: 1 mL lidocaine 1 %; 20 mg methylPREDNISolone acetate 40 MG/ML   Hand/UE Inj: L ring A1 for trigger finger on 06/27/2021 2:24 PM Indications: pain Details: 27 G needle, volar approach Medications: 1 mL lidocaine 1 %; 20 mg methylPREDNISolone acetate 40 MG/ML     Clinical Data: No additional findings.   Subjective: Chief Complaint  Patient presents with   Left Knee - Follow-up    Left total knee arthroplasty 12/18/2020  Patient presents today for a three month follow up on his left knee. He  had a left total knee arthroplasty on 12/18/2020. He is now 6 months out from surgery. He said that he has been having pain laterally for two months. He said that it hurts "a lot" and wakes him at night. No injury. He takes Aleve.  Also experiencing active triggering of the left long and ring fingers.  Has had successful trigger finger release of the index finger in the past  HPI  Review of Systems   Objective: Vital Signs: There were no vitals taken for this visit.  Physical Exam Constitutional:      Appearance: He is well-developed.  Eyes:     Pupils: Pupils are equal, round, and reactive to light.  Pulmonary:     Effort: Pulmonary effort is normal.  Skin:    General: Skin is warm and dry.  Neurological:     Mental Status: He is alert and oriented to person, place, and time.  Psychiatric:        Behavior: Behavior normal.    Ortho Exam awake alert and oriented x3.  Left knee was not hot warm or red.  Minimal if any effusion.  Significant atrophy of his left thigh compared to the right.  Full extension 105 degrees of flexion with a goniometer.  No opening with  varus or valgus stress and no localized areas of tenderness.  Active triggering of the left long and ring fingers with some discomfort on the volar aspect near the metacarpal phalangeal joint i.e. the A1 pulley neurologically intact  Specialty Comments:  No specialty comments available.  Imaging: XR KNEE 3 VIEW LEFT  Result Date: 06/27/2021 Films of the left total knee replacement obtained in 3 projections.  No evidence of complication.  Nice glue mantle and no evidence of any loosening.    PMFS History: Patient Active Problem List   Diagnosis Date Noted   Screen for colon cancer 06/06/2021   Chronic tachycardia 06/04/2021   Candidal balanitis 06/04/2021   Carpal tunnel syndrome, bilateral upper limbs 09/26/2020   Cubital tunnel syndrome on left 08/23/2020   Pulmonary nodule 1 cm or greater in diameter 04/26/2020    Statin intolerance 04/20/2020   Coronary atherosclerosis of native coronary artery 04/18/2020   Nonrheumatic aortic valve stenosis    Bicuspid aortic valve 04/06/2020   Primary osteoarthritis of left knee 11/08/2019   Bilateral primary osteoarthritis of knee 09/28/2019   HNP (herniated nucleus pulposus), lumbar 09/29/2017   Unilateral primary osteoarthritis, right knee 06/24/2016   Primary hypertension 01/18/2007   Past Medical History:  Diagnosis Date   Allergy    Arthritis    CAD (coronary artery disease)    Coronary artery disease    GERD (gastroesophageal reflux disease)    occ   Heart murmur    baby   High cholesterol    Hypertension    Peripheral neuropathy    Pneumonia    Right carotid bruit 06/10/2017   Sciatica    Seasonal allergies    Sinus tachycardia    Spinal stenosis     Family History  Problem Relation Age of Onset   Hypertension Mother    Hypertension Father    Cancer Father     Past Surgical History:  Procedure Laterality Date   CARDIAC CATHETERIZATION     CARPAL TUNNEL WITH CUBITAL TUNNEL Left 11/10/2013   Procedure: LEFT CARPAL TUNNEL RELEASE;  Surgeon: Cammie Sickle, MD;  Location: [facility];  Service: Orthopedics;  Laterality: Left;   COLONOSCOPY     CORONARY ARTERY BYPASS GRAFT N/A 05/01/2020   Procedure: CORONARY ARTERY BYPASS GRAFTING (CABG) X 2 ON CARDIOPULMONARY BYPASS. LIMA TO LAD, SVG TO DIAG;  Surgeon: Ivin Poot, MD;  Location: [facility];  Service: Open Heart Surgery;  Laterality: N/A;   ENDOVEIN HARVEST OF GREATER SAPHENOUS VEIN Right 05/01/2020   Procedure: ENDOVEIN HARVEST OF GREATER SAPHENOUS VEIN;  Surgeon: Ivin Poot, MD;  Location: [facility];  Service: Open Heart Surgery;  Laterality: Right;   KNEE ARTHROSCOPY  [id number]   left and right   LUMBAR LAMINECTOMY/DECOMPRESSION MICRODISCECTOMY Left 09/29/2017   Procedure: LEFT LUMBAR TWO - LUMBAR THREELAMINOTOMY/MICRODISCECTOMY;  Surgeon: Jovita Gamma, MD;   Location: [facility];  Service: Neurosurgery;  Laterality: Left;  LEFT LUMBAR 2- LUMBAR 3 LAMINOTOMY/MICRODISCECTOMY   NASAL SINUS SURGERY  05/2015   RIGHT/LEFT HEART CATH AND CORONARY ANGIOGRAPHY N/A 04/10/2020   Procedure: RIGHT/LEFT HEART CATH AND CORONARY ANGIOGRAPHY;  Surgeon: Troy Sine, MD;  Location: [facility];  Service: Cardiovascular;  Laterality: N/A;   TEE WITHOUT CARDIOVERSION N/A 05/01/2020   Procedure: TRANSESOPHAGEAL ECHOCARDIOGRAM (TEE);  Surgeon: Prescott Gum, Collier Salina, MD;  Location: [facility];  Service: Open Heart Surgery;  Laterality: N/A;   TOTAL KNEE ARTHROPLASTY Right 06/24/2016   Procedure: TOTAL KNEE ARTHROPLASTY;  Surgeon: Garald Balding, MD;  Location: [facility];  Service: Orthopedics;  Laterality: Right;   TOTAL KNEE ARTHROPLASTY Left 12/18/2020   Procedure: LEFT TOTAL KNEE ARTHROPLASTY;  Surgeon: Garald Balding, MD;  Location: [facility];  Service: Orthopedics;  Laterality: Left;   TRIGGER FINGER RELEASE Left 11/10/2013   Procedure: LEFT INDEX A-1 PULLEY RELEASE;  Surgeon: Cammie Sickle, MD;  Location: [facility];  Service: Orthopedics;  Laterality: Left;   ULNAR NERVE TRANSPOSITION Left 11/10/2013   Procedure: ULNAR NERVE TRANSPOSITION;  Surgeon: Cammie Sickle, MD;  Location: [facility];  Service: Orthopedics;  Laterality: Left;   Social History   Occupational History   Occupation: Self employed    Comment: plumber  Tobacco Use   Smoking status: Former    Packs/day: 1.00    Years: 37.00    Pack years: 37.00    Types: Cigarettes    Quit date: 05/04/2016    Years since quitting: 5.1   Smokeless tobacco: Never  Vaping Use   Vaping Use: Former  Substance and Sexual Activity   Alcohol use: Yes    Alcohol/week: 3.0 standard drinks    Types: 3 Glasses of wine per week    Comment: weekends only with dinner   Drug use: No   Sexual activity: Not on file

## 2021-07-02 DIAGNOSIS — L309 Dermatitis, unspecified: Secondary | ICD-10-CM | POA: Diagnosis not present

## 2021-07-02 DIAGNOSIS — L858 Other specified epidermal thickening: Secondary | ICD-10-CM | POA: Diagnosis not present

## 2021-07-15 DIAGNOSIS — M48061 Spinal stenosis, lumbar region without neurogenic claudication: Secondary | ICD-10-CM | POA: Diagnosis not present

## 2021-07-15 DIAGNOSIS — M48062 Spinal stenosis, lumbar region with neurogenic claudication: Secondary | ICD-10-CM | POA: Diagnosis not present

## 2021-07-17 ENCOUNTER — Telehealth: Payer: Self-pay | Admitting: *Deleted

## 2021-07-17 ENCOUNTER — Other Ambulatory Visit: Payer: Self-pay

## 2021-07-17 ENCOUNTER — Ambulatory Visit (AMBULATORY_SURGERY_CENTER): Payer: BC Managed Care – PPO | Admitting: *Deleted

## 2021-07-17 VITALS — Ht 68.0 in | Wt 153.0 lb

## 2021-07-17 DIAGNOSIS — Z1211 Encounter for screening for malignant neoplasm of colon: Secondary | ICD-10-CM

## 2021-07-17 MED ORDER — NA SULFATE-K SULFATE-MG SULF 17.5-3.13-1.6 GM/177ML PO SOLN: 1.0000 | ORAL | 0 refills | Status: DC

## 2021-07-17 NOTE — Progress Notes (Signed)
Patient's pre-visit was done today over the phone with the patient. Name,DOB and address verified. Patient denies any allergies to Eggs and Soy. Patient denies any problems with anesthesia/sedation. Patient is not taking any diet pills or blood thinners. No home Oxygen.  ? ?Prep instructions sent to pt's MyChart-pt aware. Patient understands to call us back with any questions or concerns. Patient is aware of our care-partner policy and [id number] safety protocol.  ? ?EMMI education assigned to the patient for the procedure, sent to [place].  ?

## 2021-07-17 NOTE — Telephone Encounter (Signed)
? ?  Pre-operative Risk Assessment  ?  ?Patient Name: Philip Winters  ?DOB: [birth year] ?MRN: [id number]  ? ?  ? ?Request for Surgical Clearance   ? ?Procedure:   LUMBAR FUSION ? ?Date of Surgery:  Clearance TBD                              ?   ?Surgeon:  DR. Newman Pies ?Surgeon's Group or Practice Name:  [facility] ?Phone number:  [phone] ?Fax number:  [facility] ?  ?Type of Clearance Requested:   ?- Medical  ?- Pharmacy:  Hold Aspirin   ?  ?Type of Anesthesia:  General  ?  ?Additional requests/questions:   ? ?Signed, ?Julaine Hua   ?07/17/2021, 9:37 AM  ? ?

## 2021-07-17 NOTE — Telephone Encounter (Signed)
? ?  Name: Philip Winters  ?DOB: February 25, 1961  ?MRN: [id number]  ? ?Primary Cardiologist: Candee Furbish, MD ? ?Chart reviewed as part of pre-operative protocol coverage. Patient was contacted 07/17/2021 in reference to pre-operative risk assessment for pending surgery as outlined below.  Philip Winters was last seen on 10/17/20 by Dr. Marlou Porch.  Since that day, Philip Winters has done well. ? ?Therefore, based on ACC/AHA guidelines, the patient would be at acceptable risk for the planned procedure without further cardiovascular testing.  ? ?The patient was advised that if he develops new symptoms prior to surgery to contact our office to arrange for a follow-up visit, and he verbalized understanding. ? ? ?Hx of CABG x2 LIMA to LAD (90% proximal LAD) and SVG to diagonal on 05/01/2020. ? ?Dr. Marlou Porch, can patient hold ASA ? ? ?Please forward your response to [facility] PREOP.  ? ?Thank you  ? ?Leanor Kail, PA ?07/17/2021, 12:13 PM  ?

## 2021-07-22 ENCOUNTER — Other Ambulatory Visit: Payer: Self-pay

## 2021-07-22 ENCOUNTER — Other Ambulatory Visit: Payer: BC Managed Care – PPO | Admitting: *Deleted

## 2021-07-22 DIAGNOSIS — I25110 Atherosclerotic heart disease of native coronary artery with unstable angina pectoris: Secondary | ICD-10-CM

## 2021-07-22 LAB — HEPATIC FUNCTION PANEL
ALT: 30 IU/L (ref 0–44)
AST: 36 IU/L (ref 0–40)
Albumin: 4.7 g/dL (ref 3.8–4.8)
Alkaline Phosphatase: 164 IU/L — ABNORMAL HIGH (ref 44–121)
Bilirubin Total: 0.4 mg/dL (ref 0.0–1.2)
Bilirubin, Direct: 0.14 mg/dL (ref 0.00–0.40)
Total Protein: 7.3 g/dL (ref 6.0–8.5)

## 2021-07-22 LAB — LIPID PANEL
Chol/HDL Ratio: 2 ratio (ref 0.0–5.0)
Cholesterol, Total: 122 mg/dL (ref 100–199)
HDL: 60 mg/dL (ref >39)
LDL Chol Calc (NIH): 44 mg/dL (ref 0–99)
Triglycerides: 98 mg/dL (ref 0–149)
VLDL Cholesterol Cal: 18 mg/dL (ref 5–40)

## 2021-07-24 NOTE — Telephone Encounter (Signed)
? ?  Primary Cardiologist: Candee Furbish, MD ? ?Chart reviewed as part of pre-operative protocol coverage. Given past medical history and time since last visit, based on ACC/AHA guidelines, FRANCIS DOENGES would be at acceptable risk for the planned procedure without further cardiovascular testing.  ? ?Patient was advised that if he develops new symptoms prior to surgery to contact our office to arrange a follow-up appointment.  He verbalized understanding. ? ?I will route this recommendation to the requesting party via Epic fax function and remove from pre-op pool. ? ?Per Dr. Marlou Porch, patient may hold aspirin as directed for surgery. We recommend that he resume aspirin as soon as he is hemodynamically stable per surgeon.  ? ?Please call with questions. ? ?Emmaline Life, NP-C ? ?  ?07/24/2021, 4:47 PM ?[facility] ?[address] ?Office [phone] Fax [phone] ? ?

## 2021-07-26 ENCOUNTER — Encounter: Payer: Self-pay | Admitting: Gastroenterology

## 2021-07-31 ENCOUNTER — Other Ambulatory Visit: Payer: Self-pay | Admitting: Gastroenterology

## 2021-07-31 ENCOUNTER — Ambulatory Visit (AMBULATORY_SURGERY_CENTER): Payer: BC Managed Care – PPO | Admitting: Gastroenterology

## 2021-07-31 ENCOUNTER — Other Ambulatory Visit: Payer: Self-pay

## 2021-07-31 ENCOUNTER — Telehealth: Payer: Self-pay | Admitting: Gastroenterology

## 2021-07-31 ENCOUNTER — Encounter: Payer: Self-pay | Admitting: Gastroenterology

## 2021-07-31 VITALS — BP 145/91 | HR 86 | Temp 98.4°F | Resp 16 | Ht 68.0 in | Wt 153.0 lb

## 2021-07-31 DIAGNOSIS — D127 Benign neoplasm of rectosigmoid junction: Secondary | ICD-10-CM | POA: Diagnosis not present

## 2021-07-31 DIAGNOSIS — D120 Benign neoplasm of cecum: Secondary | ICD-10-CM | POA: Diagnosis not present

## 2021-07-31 DIAGNOSIS — D122 Benign neoplasm of ascending colon: Secondary | ICD-10-CM

## 2021-07-31 DIAGNOSIS — D128 Benign neoplasm of rectum: Secondary | ICD-10-CM

## 2021-07-31 DIAGNOSIS — Z1211 Encounter for screening for malignant neoplasm of colon: Secondary | ICD-10-CM | POA: Diagnosis not present

## 2021-07-31 MED ORDER — SODIUM CHLORIDE 0.9 % IV SOLN: 500.0000 mL | Freq: Once | INTRAVENOUS | Status: DC

## 2021-07-31 NOTE — Progress Notes (Signed)
A and O x3. Report to RN. Tolerated MAC anesthesia well. 

## 2021-07-31 NOTE — Patient Instructions (Signed)
Handouts on polyps, diverticulosis, and hemorrhoids given. ? ?YOU HAD AN ENDOSCOPIC PROCEDURE TODAY AT [facility]:   Refer to the procedure report that was given to you for any specific questions about what was found during the examination.  If the procedure report does not answer your questions, please call your gastroenterologist to clarify.  If you requested that your care partner not be given the details of your procedure findings, then the procedure report has been included in a sealed envelope for you to review at your convenience later. ? ?YOU SHOULD EXPECT: Some feelings of bloating in the abdomen. Passage of more gas than usual.  Walking can help get rid of the air that was put into your GI tract during the procedure and reduce the bloating. If you had a lower endoscopy (such as a colonoscopy or flexible sigmoidoscopy) you may notice spotting of blood in your stool or on the toilet paper. If you underwent a bowel prep for your procedure, you may not have a normal bowel movement for a few days. ? ?Please Note:  You might notice some irritation and congestion in your nose or some drainage.  This is from the oxygen used during your procedure.  There is no need for concern and it should clear up in a day or so. ? ?SYMPTOMS TO REPORT IMMEDIATELY: ? ?Following lower endoscopy (colonoscopy or flexible sigmoidoscopy): ? Excessive amounts of blood in the stool ? Significant tenderness or worsening of abdominal pains ? Swelling of the abdomen that is new, acute ? Fever of 100?F or higher ? ? ?For urgent or emergent issues, a gastroenterologist can be reached at any hour by calling [phone]. ?Do not use MyChart messaging for urgent concerns.  ? ? ?DIET:  We do recommend a small meal at first, but then you may proceed to your regular diet.  Drink plenty of fluids but you should avoid alcoholic beverages for 24 hours. ? ?ACTIVITY:  You should plan to take it easy for the rest of today and you  should NOT DRIVE or use heavy machinery until tomorrow (because of the sedation medicines used during the test).   ? ?FOLLOW UP: ?Our staff will call the number listed on your records 48-72 hours following your procedure to check on you and address any questions or concerns that you may have regarding the information given to you following your procedure. If we do not reach you, we will leave a message.  We will attempt to reach you two times.  During this call, we will ask if you have developed any symptoms of COVID 19. If you develop any symptoms (ie: fever, flu-like symptoms, shortness of breath, cough etc.) before then, please call [phone].  If you test positive for Covid 19 in the 2 weeks post procedure, please call and report this information to [name].   ? ?If any biopsies were taken you will be contacted by phone or by letter within the next 1-3 weeks.  Please call us at [phone] if you have not heard about the biopsies in 3 weeks.  ? ? ?SIGNATURES/CONFIDENTIALITY: ?You and/or your care partner have signed paperwork which will be entered into your electronic medical record.  These signatures attest to the fact that that the information above on your After Visit Summary has been reviewed and is understood.  Full responsibility of the confidentiality of this discharge information lies with you and/or your care-partner.  ?

## 2021-07-31 NOTE — Telephone Encounter (Signed)
When I spoke with pt's wife today at 1236, reminded if pt has issues after [facility] is closed, please call the on call MD, who's number is on discharge paperwork.  Understanding voiced. ?

## 2021-07-31 NOTE — Progress Notes (Signed)
Pt's states no medical or surgical changes since previsit or office visit. VS assessed by C.W 

## 2021-07-31 NOTE — Op Note (Signed)
[place] ?Patient Name: Philip Winters ?Procedure Date: 07/31/2021 7:54 AM ?MRN: [id number] ?Endoscopist: Carlota Raspberry. Havery Moros , MD ?Age: 61 ?Referring MD:  ?Date of Birth: 12/17/1960 ?Gender: Male ?Account #: [id number] ?Procedure:                Colonoscopy ?Indications:              Screening for colorectal malignant neoplasm ?Medicines:                Monitored Anesthesia Care ?Procedure:                Pre-Anesthesia Assessment: ?                          - Prior to the procedure, a History and Physical  ?                          was performed, and patient medications and  ?                          allergies were reviewed. The patient's tolerance of  ?                          previous anesthesia was also reviewed. The risks  ?                          and benefits of the procedure and the sedation  ?                          options and risks were discussed with the patient.  ?                          All questions were answered, and informed consent  ?                          was obtained. Prior Anticoagulants: The patient has  ?                          taken no previous anticoagulant or antiplatelet  ?                          agents. ASA Grade Assessment: II - A patient with  ?                          mild systemic disease. After reviewing the risks  ?                          and benefits, the patient was deemed in  ?                          satisfactory condition to undergo the procedure. ?                          After obtaining informed consent, the colonoscope  ?  was passed under direct vision. Throughout the  ?                          procedure, the patient's blood pressure, pulse, and  ?                          oxygen saturations were monitored continuously. The  ?                          CF [id number] #[id number] was introduced through the anus  ?                          and advanced to the the cecum, identified by  ?                          appendiceal orifice  and ileocecal valve. The  ?                          colonoscopy was performed without difficulty. The  ?                          patient tolerated the procedure well. The quality  ?                          of the bowel preparation was adequate. The  ?                          ileocecal valve, appendiceal orifice, and rectum  ?                          were photographed. ?Scope In: 8:09:56 AM ?Scope Out: 8:28:22 AM ?Scope Withdrawal Time: 0 hours 16 minutes 17 seconds  ?Total Procedure Duration: 0 hours 18 minutes 26 seconds  ?Findings:                 The perianal and digital rectal examinations were  ?                          normal. ?                          A 3 mm polyp was found in the cecum. The polyp was  ?                          sessile. The polyp was removed with a cold snare.  ?                          Resection and retrieval were complete. ?                          A 3 mm polyp was found in the ascending colon. The  ?                          polyp was sessile. The polyp was removed with a  ?  cold snare. Resection and retrieval were complete. ?                          A 3 mm polyp was found in the recto-sigmoid colon.  ?                          The polyp was sessile. The polyp was removed with a  ?                          cold snare. Resection and retrieval were complete. ?                          A 4 to 5 mm polyp was found in the distal rectum  ?                          just above the dentate line. The polyp was sessile.  ?                          The polyp was removed with a cold snare. Resection  ?                          and retrieval were complete. ?                          Multiple small-mouthed diverticula were found in  ?                          the entire colon. ?                          Internal hemorrhoids were found during  ?                          retroflexion. The hemorrhoids were small. ?                          The exam was otherwise without  abnormality. ?Complications:            No immediate complications. Estimated blood loss:  ?                          Minimal. ?Estimated Blood Loss:     Estimated blood loss was minimal. ?Impression:               - One 3 mm polyp in the cecum, removed with a cold  ?                          snare. Resected and retrieved. ?                          - One 3 mm polyp in the ascending colon, removed  ?                          with a cold snare. Resected and retrieved. ?                          -  One 3 mm polyp at the recto-sigmoid colon,  ?                          removed with a cold snare. Resected and retrieved. ?                          - One 4 to 5 mm polyp in the distal rectum, removed  ?                          with a cold snare. Resected and retrieved. ?                          - Diverticulosis in the entire examined colon. ?                          - Internal hemorrhoids. ?                          - The examination was otherwise normal. ?Recommendation:           - Patient has a contact number available for  ?                          emergencies. The signs and symptoms of potential  ?                          delayed complications were discussed with the  ?                          patient. Return to normal activities tomorrow.  ?                          Written discharge instructions were provided to the  ?                          patient. ?                          - Resume previous diet. ?                          - Continue present medications. ?                          - Await pathology results. ?Carlota Raspberry. Dollye Glasser, MD ?07/31/2021 8:35:49 AM ?This report has been signed electronically. ?

## 2021-07-31 NOTE — Progress Notes (Signed)
Called to room to assist during endoscopic procedure.  Patient ID and intended procedure confirmed with present staff. Received instructions for my participation in the procedure from the performing physician.  

## 2021-07-31 NOTE — Telephone Encounter (Signed)
Inbound call from patient. Had procedure this morning 3/15. States since being home have attempted to use restroom 2x and both times there have been a lot of blood in stool. Best contact number [phone] ?

## 2021-07-31 NOTE — Progress Notes (Signed)
[facility] History and Physical ? ? ?Primary Care Physician:  Janith Lima, MD ? ? ?Reason for Procedure:   Colon cancer screening ? ?Plan:    colonoscopy ? ? ? ? ?HPI: Philip Winters is a 61 y.o. male  here for colonoscopy screening / surveillance - last exam 10 years ago. Patient denies any bowel symptoms at this time. No family history of colon cancer known. Otherwise feels well without any cardiopulmonary symptoms.  ? ? ?Past Medical History:  ?Diagnosis Date  ? Allergy   ? Arthritis   ? CAD (coronary artery disease)   ? Coronary artery disease   ? GERD (gastroesophageal reflux disease)   ? occ  ? Heart murmur   ? baby  ? High cholesterol   ? Hypertension   ? Peripheral neuropathy   ? Pneumonia   ? Right carotid bruit 06/10/2017  ? Sciatica   ? Seasonal allergies   ? Sinus tachycardia   ? Spinal stenosis   ? ? ?Past Surgical History:  ?Procedure Laterality Date  ? CARDIAC CATHETERIZATION    ? CARPAL TUNNEL WITH CUBITAL TUNNEL Left 11/10/2013  ? Procedure: LEFT CARPAL TUNNEL RELEASE;  Surgeon: Cammie Sickle, MD;  Location: [place];  Service: Orthopedics;  Laterality: Left;  ? COLONOSCOPY  2013  ? at [facility], [place]-normal exam per pt  ? CORONARY ARTERY BYPASS GRAFT N/A 05/01/2020  ? Procedure: CORONARY ARTERY BYPASS GRAFTING (CABG) X 2 ON CARDIOPULMONARY BYPASS. LIMA TO LAD, SVG TO DIAG;  Surgeon: Ivin Poot, MD;  Location: [place];  Service: Open Heart Surgery;  Laterality: N/A;  ? ENDOVEIN HARVEST OF GREATER SAPHENOUS VEIN Right 05/01/2020  ? Procedure: ENDOVEIN HARVEST OF GREATER SAPHENOUS VEIN;  Surgeon: Ivin Poot, MD;  Location: [place];  Service: Open Heart Surgery;  Laterality: Right;  ? KNEE ARTHROSCOPY  [id number]  ? left and right  ? LUMBAR LAMINECTOMY/DECOMPRESSION MICRODISCECTOMY Left 09/29/2017  ? Procedure: LEFT LUMBAR TWO - LUMBAR THREELAMINOTOMY/MICRODISCECTOMY;  Surgeon: Jovita Gamma, MD;  Location: [place];  Service: Neurosurgery;  Laterality:  Left;  LEFT LUMBAR 2- LUMBAR 3 LAMINOTOMY/MICRODISCECTOMY  ? NASAL SINUS SURGERY  05/2015  ? RIGHT/LEFT HEART CATH AND CORONARY ANGIOGRAPHY N/A 04/10/2020  ? Procedure: RIGHT/LEFT HEART CATH AND CORONARY ANGIOGRAPHY;  Surgeon: Troy Sine, MD;  Location: [place] [facility];  Service: Cardiovascular;  Laterality: N/A;  ? TEE WITHOUT CARDIOVERSION N/A 05/01/2020  ? Procedure: TRANSESOPHAGEAL ECHOCARDIOGRAM (TEE);  Surgeon: Prescott Gum, Collier Salina, MD;  Location: [place];  Service: Open Heart Surgery;  Laterality: N/A;  ? TOTAL KNEE ARTHROPLASTY Right 06/24/2016  ? Procedure: TOTAL KNEE ARTHROPLASTY;  Surgeon: Garald Balding, MD;  Location: [place];  Service: Orthopedics;  Laterality: Right;  ? TOTAL KNEE ARTHROPLASTY Left 12/18/2020  ? Procedure: LEFT TOTAL KNEE ARTHROPLASTY;  Surgeon: Garald Balding, MD;  [facility];  Service: Orthopedics;  Laterality: Left;  ? TRIGGER FINGER RELEASE Left 11/10/2013  ? Procedure: LEFT INDEX A-1 PULLEY RELEASE;  Surgeon: Cammie Sickle, MD;  Location: [place];  Service: Orthopedics;  Laterality: Left;  ? ULNAR NERVE TRANSPOSITION Left 11/10/2013  ? Procedure: ULNAR NERVE TRANSPOSITION;  Surgeon: Cammie Sickle, MD;  Location: [place];  Service: Orthopedics;  Laterality: Left;  ? ? ?Prior to Admission medications   ?Medication Sig Start Date End Date Taking? Authorizing Provider  ?Alirocumab (PRALUENT) 75 MG/ML SOAJ Inject 1 pen into the skin every 14 (fourteen) days.  05/27/21  Yes Jerline Pain, MD  ?aspirin EC 81 MG tablet Take 1 tablet (81 mg total) by mouth 2 (two) times daily after a meal. Swallow whole. 12/19/20  Yes Petrarca, Mike Craze, PA-C  ?diltiazem (CARDIZEM CD) 240 MG 24 hr capsule Take 240 mg by mouth daily.   Yes [provider]  ?fluticasone (FLONASE) 50 MCG/ACT nasal spray Place 2 sprays into both nostrils daily as needed for allergies.    Yes [provider]  ?Multiple Vitamin (MULTIVITAMIN WITH MINERALS)  TABS tablet Take 1 tablet by mouth in the morning.   Yes [provider]  ?olmesartan (BENICAR) 20 MG tablet Take 1 tablet (20 mg total) by mouth daily. 06/05/21  Yes Janith Lima, MD  ?Polyethyl Glycol-Propyl Glycol (LUBRICANT EYE DROPS) 0.4-0.3 % SOLN Place 1 drop into both eyes 3 (three) times daily as needed (dry/irritated eyes).   Yes [provider]  ?acetaminophen (TYLENOL) 325 MG tablet Take 2 tablets (650 mg total) by mouth every 6 (six) hours as needed for mild pain or moderate pain (pain score 1-3 or temp > 100.5). 12/19/20   Cherylann Ratel, PA-C  ?diphenhydrAMINE (BENADRYL) 25 MG tablet Take 12.5 mg by mouth at bedtime. ?Patient not taking: Reported on 07/31/2021    [provider]  ? ? ?Current Outpatient Medications  ?Medication Sig Dispense Refill  ? Alirocumab (PRALUENT) 75 MG/ML SOAJ Inject 1 pen into the skin every 14 (fourteen) days. 2 mL 11  ? aspirin EC 81 MG tablet Take 1 tablet (81 mg total) by mouth 2 (two) times daily after a meal. Swallow whole. 90 tablet 3  ? diltiazem (CARDIZEM CD) 240 MG 24 hr capsule Take 240 mg by mouth daily.    ? fluticasone (FLONASE) 50 MCG/ACT nasal spray Place 2 sprays into both nostrils daily as needed for allergies.     ? Multiple Vitamin (MULTIVITAMIN WITH MINERALS) TABS tablet Take 1 tablet by mouth in the morning.    ? olmesartan (BENICAR) 20 MG tablet Take 1 tablet (20 mg total) by mouth daily. 90 tablet 0  ? Polyethyl Glycol-Propyl Glycol (LUBRICANT EYE DROPS) 0.4-0.3 % SOLN Place 1 drop into both eyes 3 (three) times daily as needed (dry/irritated eyes).    ? acetaminophen (TYLENOL) 325 MG tablet Take 2 tablets (650 mg total) by mouth every 6 (six) hours as needed for mild pain or moderate pain (pain score 1-3 or temp > 100.5). 100 tablet 0  ? diphenhydrAMINE (BENADRYL) 25 MG tablet Take 12.5 mg by mouth at bedtime. (Patient not taking: Reported on 07/31/2021)    ? ?Current Facility-Administered Medications  ?Medication Dose  Route Frequency Provider Last Rate Last Admin  ? 0.9 %  sodium chloride infusion  500 mL Intravenous Once Alondra Sahni, Carlota Raspberry, MD      ? ? ?Allergies as of 07/31/2021 - Review Complete 07/31/2021  ?Allergen Reaction Noted  ? Mushroom extract complex Anaphylaxis 06/12/2016  ? Penicillins Shortness Of Breath   ? Atorvastatin Other (See Comments) 04/20/2020  ? Dilaudid [hydromorphone] Nausea And Vomiting 09/29/2017  ? Losartan potassium Other (See Comments) 09/04/2020  ? Omega-3-acid ethyl esters  02/01/2021  ? Oxycodone hcl  02/01/2021  ? Pravastatin Other (See Comments) 04/20/2020  ? Red dye  02/01/2021  ? Rosuvastatin Other (See Comments) 04/20/2020  ? Varenicline  04/19/2020  ? Bystolic [nebivolol hcl] Other (See Comments) 07/13/2017  ? Codeine Nausea And Vomiting   ? Iodine Rash   ? Iohexol Rash 10/16/2020  ?  Metoprolol Other (See Comments) 07/13/2017  ? Repatha [evolocumab] Rash and Other (See Comments) 11/28/2020  ? ? ?Family History  ?Problem Relation Age of Onset  ? Hypertension Mother   ? Throat cancer Mother   ? Hypertension Father   ? Cancer Father   ? Colon cancer Neg Hx   ? Esophageal cancer Neg Hx   ? Stomach cancer Neg Hx   ? Rectal cancer Neg Hx   ? Colon polyps Neg Hx   ? ? ?Social History  ? ?Socioeconomic History  ? Marital status: Married  ?  Spouse name: [name]  ? Number of children: 0  ? Years of education: 13  ? Highest education level: Not on file  ?Occupational History  ? 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 Smoking status: Former  ?  Packs/day: 1.00  ?  Years: 37.00  ?  Pack years: 37.00  ?  Types: Cigarettes  ?  Quit date: 05/04/2016  ?  Years since quitting: 5.2  ? Smokeless tobacco: Never  ?Vaping Use  ? Vaping Use: Former  ?Substance and Sexual Activity  ? Alcohol use: Yes  ?  Alcohol/week: 5.0 standard drinks  ?  Types: 5 Glasses of wine per week  ?  Comment: weekends only with dinner  ? Drug use: No  ? Sexual activity: Not on file  ?Other Topics Concern  ?  Not on file  ?Social History Narrative  ? Lives with wife, [name]  ? Caffeine use: 1 Coffee daily  ? Right handed   ? ?Social Determinants of Health  ? ?Financial Resource Strain: Not on file  ?Food Ins

## 2021-07-31 NOTE — Telephone Encounter (Signed)
Spoke with pt's wife.  She states pt had episode at 945 of "bleeding when he sat down to use the bathroom."  She and pt are unsure of amount of blood.  Pt then took a nap and then used bathroom again at 12 noon and another episode of bleeding while sitting on toilet.  She states she didn't note any clots, but "it was dark at the bottom of the toilet with blood and I just wanted to let you guys know."  She states she was told to expect bleeding d/t where polyp in rectum was removed but was just letting us know. ? ?Pt is having no pain, dizziness and has eaten without difficulty.  I spoke with Dr. Havery Moros about this and he advised pt to call back if he has any more issues. Mentioned to call back by this 3- 3:30 pm to the [place] if he has issues and understanding voiced.  ?

## 2021-08-02 ENCOUNTER — Telehealth: Payer: Self-pay | Admitting: *Deleted

## 2021-08-02 NOTE — Telephone Encounter (Signed)
Attempted to call patient for their post-procedure follow-up call. No answer. Left voicemail.   

## 2021-08-02 NOTE — Telephone Encounter (Signed)
?  Follow up Call- ? ?Call back number 07/31/2021  ?Post procedure Call Back phone  # [phone]  ?Permission to leave phone message Yes  ?Some recent data might be hidden  ?  ? ?Patient questions: ? ?Do you have a fever, pain , or abdominal swelling? No. ?Pain Score  0 * ? ?Have you tolerated food without any problems? Yes.   ? ?Have you been able to return to your normal activities? Yes.   ? ?Do you have any questions about your discharge instructions: ?Diet   No. ?Medications  No. ?Follow up visit  No. ? ?Do you have questions or concerns about your Care? No. ? ?Actions: ?* If pain score is 4 or above: ?No action needed, pain <4. ? ? ?

## 2021-08-12 DIAGNOSIS — L309 Dermatitis, unspecified: Secondary | ICD-10-CM | POA: Diagnosis not present

## 2021-08-12 DIAGNOSIS — L858 Other specified epidermal thickening: Secondary | ICD-10-CM | POA: Diagnosis not present

## 2021-08-13 ENCOUNTER — Other Ambulatory Visit: Payer: Self-pay

## 2021-08-13 ENCOUNTER — Ambulatory Visit (INDEPENDENT_AMBULATORY_CARE_PROVIDER_SITE_OTHER): Payer: BC Managed Care – PPO

## 2021-08-13 ENCOUNTER — Encounter: Payer: Self-pay | Admitting: Podiatry

## 2021-08-13 ENCOUNTER — Ambulatory Visit: Payer: BC Managed Care – PPO | Admitting: Podiatry

## 2021-08-13 DIAGNOSIS — M722 Plantar fascial fibromatosis: Secondary | ICD-10-CM

## 2021-08-13 DIAGNOSIS — R29898 Other symptoms and signs involving the musculoskeletal system: Secondary | ICD-10-CM | POA: Insufficient documentation

## 2021-08-13 DIAGNOSIS — M5412 Radiculopathy, cervical region: Secondary | ICD-10-CM | POA: Insufficient documentation

## 2021-08-13 DIAGNOSIS — M778 Other enthesopathies, not elsewhere classified: Secondary | ICD-10-CM

## 2021-08-13 DIAGNOSIS — G5620 Lesion of ulnar nerve, unspecified upper limb: Secondary | ICD-10-CM | POA: Insufficient documentation

## 2021-08-13 DIAGNOSIS — M542 Cervicalgia: Secondary | ICD-10-CM | POA: Insufficient documentation

## 2021-08-13 MED ORDER — TRIAMCINOLONE ACETONIDE 40 MG/ML IJ SUSP
40.0000 mg | Freq: Once | INTRAMUSCULAR | Status: AC
  Administered 2021-08-13: 40 mg

## 2021-08-13 MED ORDER — METHYLPREDNISOLONE 4 MG PO TBPK: ORAL_TABLET | ORAL | 0 refills | Status: DC

## 2021-08-13 NOTE — Patient Instructions (Addendum)

## 2021-08-13 NOTE — Progress Notes (Signed)
?Subjective:  ?Patient ID: Philip Winters, male    DOB: May 23, 1960,  MRN: [id number] ?HPI ?Chief Complaint  ?Patient presents with  ? Foot Pain  ?  Plantar toes and heels bilateral - numbness in toes x several months, doc checked for neuropathy (negative), does have spinal stenosis, heels hurt when walking his dogs, some pain upon arising  ? New Patient (Initial Visit)  ? ? ?61 y.o. male presents with the above complaint.  ? ?ROS: Denies fever chills nausea vomiting muscle pains chest pain shortness of breath.  Does relate a history of spinal stenosis with chronic back pain. ? ?Past Medical History:  ?Diagnosis Date  ? Allergy   ? Arthritis   ? CAD (coronary artery disease)   ? Coronary artery disease   ? GERD (gastroesophageal reflux disease)   ? occ  ? Heart murmur   ? baby  ? High cholesterol   ? Hypertension   ? Peripheral neuropathy   ? Pneumonia   ? Right carotid bruit 06/10/2017  ? Sciatica   ? Seasonal allergies   ? Sinus tachycardia   ? Spinal stenosis   ? ?Past Surgical History:  ?Procedure Laterality Date  ? CARDIAC CATHETERIZATION    ? CARPAL TUNNEL WITH CUBITAL TUNNEL Left 11/10/2013  ? Procedure: LEFT CARPAL TUNNEL RELEASE;  Surgeon: Cammie Sickle, MD;  Location: [place];  Service: Orthopedics;  Laterality: Left;  ? COLONOSCOPY  2013  ? at [facility], [place]-normal exam per pt  ? CORONARY ARTERY BYPASS GRAFT N/A 05/01/2020  ? Procedure: CORONARY ARTERY BYPASS GRAFTING (CABG) X 2 ON CARDIOPULMONARY BYPASS. LIMA TO LAD, SVG TO DIAG;  Surgeon: Ivin Poot, MD;  Location: [place];  Service: Open Heart Surgery;  Laterality: N/A;  ? ENDOVEIN HARVEST OF GREATER SAPHENOUS VEIN Right 05/01/2020  ? Procedure: ENDOVEIN HARVEST OF GREATER SAPHENOUS VEIN;  Surgeon: Ivin Poot, MD;  Location: [place];  Service: Open Heart Surgery;  Laterality: Right;  ? KNEE ARTHROSCOPY  [id number]  ? left and right  ? LUMBAR LAMINECTOMY/DECOMPRESSION MICRODISCECTOMY Left 09/29/2017  ? Procedure: LEFT LUMBAR  TWO - LUMBAR THREELAMINOTOMY/MICRODISCECTOMY;  Surgeon: Jovita Gamma, MD;  Location: [place];  Service: Neurosurgery;  Laterality: Left;  LEFT LUMBAR 2- LUMBAR 3 LAMINOTOMY/MICRODISCECTOMY  ? NASAL SINUS SURGERY  05/2015  ? RIGHT/LEFT HEART CATH AND CORONARY ANGIOGRAPHY N/A 04/10/2020  ? Procedure: RIGHT/LEFT HEART CATH AND CORONARY ANGIOGRAPHY;  Surgeon: Troy Sine, MD;  Location: [place] [facility];  Service: Cardiovascular;  Laterality: N/A;  ? TEE WITHOUT CARDIOVERSION N/A 05/01/2020  ? Procedure: TRANSESOPHAGEAL ECHOCARDIOGRAM (TEE);  Surgeon: Prescott Gum, Collier Salina, MD;  Location: [place];  Service: Open Heart Surgery;  Laterality: N/A;  ? TOTAL KNEE ARTHROPLASTY Right 06/24/2016  ? Procedure: TOTAL KNEE ARTHROPLASTY;  Surgeon: Garald Balding, MD;  Location: [place];  Service: Orthopedics;  Laterality: Right;  ? TOTAL KNEE ARTHROPLASTY Left 12/18/2020  ? Procedure: LEFT TOTAL KNEE ARTHROPLASTY;  Surgeon: Garald Balding, MD;  [facility];  Service: Orthopedics;  Laterality: Left;  ? TRIGGER FINGER RELEASE Left 11/10/2013  ? Procedure: LEFT INDEX A-1 PULLEY RELEASE;  Surgeon: Cammie Sickle, MD;  Location: [place];  Service: Orthopedics;  Laterality: Left;  ? ULNAR NERVE TRANSPOSITION Left 11/10/2013  ? Procedure: ULNAR NERVE TRANSPOSITION;  Surgeon: Cammie Sickle, MD;  Location: [place];  Service: Orthopedics;  Laterality: Left;  ? ? ?Current Outpatient Medications:  ?  methylPREDNISolone (MEDROL  DOSEPAK) 4 MG TBPK tablet, 6 day dose pack - take as directed, Disp: 21 tablet, Rfl: 0 ?  acetaminophen (TYLENOL) 325 MG tablet, Take 2 tablets (650 mg total) by mouth every 6 (six) hours as needed for mild pain or moderate pain (pain score 1-3 or temp > 100.5)., Disp: 100 tablet, Rfl: 0 ?  Alirocumab (PRALUENT) 75 MG/ML SOAJ, Inject 1 pen into the skin every 14 (fourteen) days., Disp: 2 mL, Rfl: 11 ?  aspirin EC 81 MG tablet, Take 1 tablet (81 mg total) by mouth 2  (two) times daily after a meal. Swallow whole., Disp: 90 tablet, Rfl: 3 ?  clobetasol cream (TEMOVATE) 7.61 %, Apply 1 application. topically 2 (two) times daily., Disp: , Rfl:  ?  diltiazem (CARDIZEM CD) 240 MG 24 hr capsule, Take 240 mg by mouth daily., Disp: , Rfl:  ?  diphenhydrAMINE (BENADRYL) 25 MG tablet, Take 12.5 mg by mouth at bedtime. (Patient not taking: Reported on 07/31/2021), Disp: , Rfl:  ?  fluticasone (FLONASE) 50 MCG/ACT nasal spray, Place 2 sprays into both nostrils daily as needed for allergies. , Disp: , Rfl:  ?  Multiple Vitamin (MULTIVITAMIN WITH MINERALS) TABS tablet, Take 1 tablet by mouth in the morning., Disp: , Rfl:  ?  olmesartan (BENICAR) 20 MG tablet, Take 1 tablet (20 mg total) by mouth daily., Disp: 90 tablet, Rfl: 0 ?  Polyethyl Glycol-Propyl Glycol (LUBRICANT EYE DROPS) 0.4-0.3 % SOLN, Place 1 drop into both eyes 3 (three) times daily as needed (dry/irritated eyes)., Disp: , Rfl:  ? ?Allergies  ?Allergen Reactions  ? Mushroom Extract Complex Anaphylaxis  ? Penicillins Shortness Of Breath  ?  Childhood allergy.  ?Has patient had a PCN reaction causing immediate rash, facial/tongue/throat swelling, SOB or lightheadedness with hypotension: No ?Has patient had a PCN reaction causing severe rash involving mucus membranes or skin necrosis: No ?Has patient had a PCN reaction that required hospitalization No ?Has patient had a PCN reaction occurring within the last 10 years: No ?If all of the above answers are "NO", then may proceed with C  ? Atorvastatin Other (See Comments)  ?  myalgia  ? Dilaudid [Hydromorphone] Nausea And Vomiting  ? Losartan Potassium Other (See Comments)  ?  Lethargic   ? Omega-3-Acid Ethyl Esters   ?  Other reaction(s): myalgia  ? Oxycodone Hcl   ?  Other reaction(s): hallucinations  ? Pravastatin Other (See Comments)  ?  myalgia  ? Red Dye   ?  Other reaction(s): rash  ? Rosuvastatin Other (See Comments)  ?  myalgia  ? Varenicline   ?  Other reaction(s):  headaches ?Other reaction(s): headaches  ? Bystolic [Nebivolol Hcl] Other (See Comments)  ?  Extreme fatigue, not able to focus  ? Codeine Nausea And Vomiting  ? Iodine Rash  ? Iohexol Rash  ? Metoprolol Other (See Comments)  ?  Made patient very fatigued, could not focus  ? Repatha [Evolocumab] Rash and Other (See Comments)  ?  Hip pain and rash  ? ?Review of Systems ?Objective:  ?There were no vitals filed for this visit. ? ?General: Well developed, nourished, in no acute distress, alert and oriented x3  ? ?Dermatological: Skin is warm, dry and supple bilateral. Nails x 10 are well maintained; remaining integument appears unremarkable at this time. There are no open sores, no preulcerative lesions, no rash or signs of infection present. ? ?Vascular: Dorsalis Pedis artery and Posterior Tibial artery pedal pulses are 2/4 bilateral with  immedate capillary fill time. Pedal hair growth present. No varicosities and no lower extremity edema present bilateral.  ? ?Neruologic: Grossly intact via light touch bilateral. Vibratory intact via tuning fork bilateral. Protective threshold with Semmes Wienstein monofilament intact to all pedal sites bilateral. Patellar and Achilles deep tendon reflexes 2+ bilateral. No Babinski or clonus noted bilateral.  ? ?Musculoskeletal: No gross boney pedal deformities bilateral. No pain, crepitus, or limitation noted with foot and ankle range of motion bilateral. Muscular strength 5/5 in all groups tested bilateral.  Pain on palpation medial calcaneal tubercles bilateral.  No pain on palpation or range of motion of the metatarsophalangeal joints. ? ?Gait: Unassisted, Nonantalgic.  ? ? ?Radiographs: ? ?Radiographs taken today do not demonstrate any type of osseous abnormalities other than soft tissue increase in density plantar fascial Caney insertion site.  Radiographs do demonstrate calcification of the arteries he does have a history of heart disease. ? ?Assessment & Plan:  ? ?Assessment:  Neuropathy forefoot restricted to just the toes bilaterally possibly associated with his spinal stenosis.  He has a negative history of nerve conduction velocity exam. ? ?Planter fasciitis bilateral heels. ? ?

## 2021-08-23 ENCOUNTER — Other Ambulatory Visit: Payer: Self-pay | Admitting: *Deleted

## 2021-08-23 DIAGNOSIS — Z87891 Personal history of nicotine dependence: Secondary | ICD-10-CM

## 2021-08-23 DIAGNOSIS — Z122 Encounter for screening for malignant neoplasm of respiratory organs: Secondary | ICD-10-CM

## 2021-09-04 ENCOUNTER — Ambulatory Visit: Payer: BC Managed Care – PPO | Admitting: Internal Medicine

## 2021-09-04 ENCOUNTER — Encounter: Payer: Self-pay | Admitting: Internal Medicine

## 2021-09-04 VITALS — BP 138/82 | HR 106 | Temp 98.3°F | Resp 16 | Ht 68.0 in | Wt 150.0 lb

## 2021-09-04 DIAGNOSIS — R0609 Other forms of dyspnea: Secondary | ICD-10-CM | POA: Diagnosis not present

## 2021-09-04 DIAGNOSIS — I10 Essential (primary) hypertension: Secondary | ICD-10-CM | POA: Diagnosis not present

## 2021-09-04 LAB — CBC WITH DIFFERENTIAL/PLATELET
Basophils Absolute: 0.1 10*3/uL (ref 0.0–0.1)
Basophils Relative: 2.1 % (ref 0.0–3.0)
Eosinophils Absolute: 0.2 10*3/uL (ref 0.0–0.7)
Eosinophils Relative: 3.2 % (ref 0.0–5.0)
HCT: 44.3 % (ref 39.0–52.0)
Hemoglobin: 15.1 g/dL (ref 13.0–17.0)
Lymphocytes Relative: 29.7 % (ref 12.0–46.0)
Lymphs Abs: 1.5 10*3/uL (ref 0.7–4.0)
MCHC: 34.1 g/dL (ref 30.0–36.0)
MCV: 97 fl (ref 78.0–100.0)
Monocytes Absolute: 0.5 10*3/uL (ref 0.1–1.0)
Monocytes Relative: 10.2 % (ref 3.0–12.0)
Neutro Abs: 2.8 10*3/uL (ref 1.4–7.7)
Neutrophils Relative %: 54.8 % (ref 43.0–77.0)
Platelets: 250 10*3/uL (ref 150.0–400.0)
RBC: 4.56 Mil/uL (ref 4.22–5.81)
RDW: 13.9 % (ref 11.5–15.5)
WBC: 5.1 10*3/uL (ref 4.0–10.5)

## 2021-09-04 LAB — BASIC METABOLIC PANEL
BUN: 10 mg/dL (ref 6–23)
CO2: 28 mEq/L (ref 19–32)
Calcium: 10 mg/dL (ref 8.4–10.5)
Chloride: 102 mEq/L (ref 96–112)
Creatinine, Ser: 0.64 mg/dL (ref 0.40–1.50)
GFR: 101.94 mL/min (ref >60.00)
Glucose, Bld: 93 mg/dL (ref 70–99)
Potassium: 5.1 mEq/L (ref 3.5–5.1)
Sodium: 138 mEq/L (ref 135–145)

## 2021-09-04 LAB — BRAIN NATRIURETIC PEPTIDE: Pro B Natriuretic peptide (BNP): 25 pg/mL (ref 0.0–100.0)

## 2021-09-04 LAB — TROPONIN I (HIGH SENSITIVITY): High Sens Troponin I: 6 ng/L (ref 2–17)

## 2021-09-04 MED ORDER — OLMESARTAN MEDOXOMIL 20 MG PO TABS
20.0000 mg | ORAL_TABLET | Freq: Every day | ORAL | 1 refills | Status: DC
  Filled 2022-01-28: qty 90, 90d supply, fill #0

## 2021-09-04 NOTE — Patient Instructions (Signed)
Hypertension, Adult High blood pressure (hypertension) is when the force of blood pumping through the arteries is too strong. The arteries are the blood vessels that carry blood from the heart throughout the body. Hypertension forces the heart to work harder to pump blood and may cause arteries to become narrow or stiff. Untreated or uncontrolled hypertension can lead to a heart attack, heart failure, a stroke, kidney disease, and other problems. A blood pressure reading consists of a higher number over a lower number. Ideally, your blood pressure should be below 120/80. The first ("top") number is called the systolic pressure. It is a measure of the pressure in your arteries as your heart beats. The second ("bottom") number is called the diastolic pressure. It is a measure of the pressure in your arteries as the heart relaxes. What are the causes? The exact cause of this condition is not known. There are some conditions that result in high blood pressure. What increases the risk? Certain factors may make you more likely to develop high blood pressure. Some of these risk factors are under your control, including: Smoking. Not getting enough exercise or physical activity. Being overweight. Having too much fat, sugar, calories, or salt (sodium) in your diet. Drinking too much alcohol. Other risk factors include: Having a personal history of heart disease, diabetes, high cholesterol, or kidney disease. Stress. Having a family history of high blood pressure and high cholesterol. Having obstructive sleep apnea. Age. The risk increases with age. What are the signs or symptoms? High blood pressure may not cause symptoms. Very high blood pressure (hypertensive crisis) may cause: Headache. Fast or irregular heartbeats (palpitations). Shortness of breath. Nosebleed. Nausea and vomiting. Vision changes. Severe chest pain, dizziness, and seizures. How is this diagnosed? This condition is diagnosed by  measuring your blood pressure while you are seated, with your arm resting on a flat surface, your legs uncrossed, and your feet flat on the floor. The cuff of the blood pressure monitor will be placed directly against the skin of your upper arm at the level of your heart. Blood pressure should be measured at least twice using the same arm. Certain conditions can cause a difference in blood pressure between your right and left arms. If you have a high blood pressure reading during one visit or you have normal blood pressure with other risk factors, you may be asked to: Return on a different day to have your blood pressure checked again. Monitor your blood pressure at home for 1 week or longer. If you are diagnosed with hypertension, you may have other blood or imaging tests to help your health care provider understand your overall risk for other conditions. How is this treated? This condition is treated by making healthy lifestyle changes, such as eating healthy foods, exercising more, and reducing your alcohol intake. You may be referred for counseling on a healthy diet and physical activity. Your health care provider may prescribe medicine if lifestyle changes are not enough to get your blood pressure under control and if: Your systolic blood pressure is above 130. Your diastolic blood pressure is above 80. Your personal target blood pressure may vary depending on your medical conditions, your age, and other factors. Follow these instructions at home: Eating and drinking  Eat a diet that is high in fiber and potassium, and low in sodium, added sugar, and fat. An example of this eating plan is called the DASH diet. DASH stands for Dietary Approaches to Stop Hypertension. To eat this way: Eat   plenty of fresh fruits and vegetables. Try to fill one half of your plate at each meal with fruits and vegetables. Eat whole grains, such as whole-wheat pasta, brown rice, or whole-grain bread. Fill about one  fourth of your plate with whole grains. Eat or drink low-fat dairy products, such as skim milk or low-fat yogurt. Avoid fatty cuts of meat, processed or cured meats, and poultry with skin. Fill about one fourth of your plate with lean proteins, such as fish, chicken without skin, beans, eggs, or tofu. Avoid pre-made and processed foods. These tend to be higher in sodium, added sugar, and fat. Reduce your daily sodium intake. Many people with hypertension should eat less than 1,500 mg of sodium a day. Do not drink alcohol if: Your health care provider tells you not to drink. You are pregnant, may be pregnant, or are planning to become pregnant. If you drink alcohol: Limit how much you have to: 0-1 drink a day for women. 0-2 drinks a day for men. Know how much alcohol is in your drink. In the [place] one drink equals one 12 oz bottle of beer (355 mL), one 5 oz glass of wine (148 mL), or one 1 oz glass of hard liquor (44 mL). Lifestyle  Work with your health care provider to maintain a healthy body weight or to lose weight. Ask what an ideal weight is for you. Get at least 30 minutes of exercise that causes your heart to beat faster (aerobic exercise) most days of the week. Activities may include walking, swimming, or biking. Include exercise to strengthen your muscles (resistance exercise), such as Pilates or lifting weights, as part of your weekly exercise routine. Try to do these types of exercises for 30 minutes at least 3 days a week. Do not use any products that contain nicotine or tobacco. These products include cigarettes, chewing tobacco, and vaping devices, such as e-cigarettes. If you need help quitting, ask your health care provider. Monitor your blood pressure at home as told by your health care provider. Keep all follow-up visits. This is important. Medicines Take over-the-counter and prescription medicines only as told by your health care provider. Follow directions carefully. Blood  pressure medicines must be taken as prescribed. Do not skip doses of blood pressure medicine. Doing this puts you at risk for problems and can make the medicine less effective. Ask your health care provider about side effects or reactions to medicines that you should watch for. Contact a health care provider if you: Think you are having a reaction to a medicine you are taking. Have headaches that keep coming back (recurring). Feel dizzy. Have swelling in your ankles. Have trouble with your vision. Get help right away if you: Develop a severe headache or confusion. Have unusual weakness or numbness. Feel faint. Have severe pain in your chest or abdomen. Vomit repeatedly. Have trouble breathing. These symptoms may be an emergency. Get help right away. Call 911. Do not wait to see if the symptoms will go away. Do not drive yourself to the hospital. Summary Hypertension is when the force of blood pumping through your arteries is too strong. If this condition is not controlled, it may put you at risk for serious complications. Your personal target blood pressure may vary depending on your medical conditions, your age, and other factors. For most people, a normal blood pressure is less than 120/80. Hypertension is treated with lifestyle changes, medicines, or a combination of both. Lifestyle changes include losing weight, eating a healthy,   low-sodium diet, exercising more, and limiting alcohol. This information is not intended to replace advice given to you by your health care provider. Make sure you discuss any questions you have with your health care provider. Document Revised: 03/12/2021 Document Reviewed: 03/12/2021 Elsevier Patient Education  2023 Elsevier Inc.  

## 2021-09-04 NOTE — Progress Notes (Signed)
? ?Subjective:  ?Patient ID: Philip Winters, male    DOB: [birth year]  Age: 61 y.o. MRN: [id number] ? ?CC: Hypertension ? ? ?HPI ?Philip Winters presents for f/up- ? ?He is 1-1/2 years status post coronary artery bypass grafting.  He walks about 30 minutes a day and for the last month he has noticed dyspnea on exertion when he climbs a hill.  He denies chest pain, diaphoresis, dizziness, lightheadedness, or edema.  He admits to being anxious about an upcoming back surgery. ? ?Outpatient Medications Prior to Visit  ?Medication Sig Dispense Refill  ? acetaminophen (TYLENOL) 325 MG tablet Take 2 tablets (650 mg total) by mouth every 6 (six) hours as needed for mild pain or moderate pain (pain score 1-3 or temp > 100.5). 100 tablet 0  ? Alirocumab (PRALUENT) 75 MG/ML SOAJ Inject 1 pen into the skin every 14 (fourteen) days. 2 mL 11  ? aspirin EC 81 MG tablet Take 1 tablet (81 mg total) by mouth 2 (two) times daily after a meal. Swallow whole. 90 tablet 3  ? clobetasol cream (TEMOVATE) 9.51 % Apply 1 application. topically 2 (two) times daily.    ? diltiazem (CARDIZEM CD) 240 MG 24 hr capsule Take 240 mg by mouth daily.    ? diphenhydrAMINE (BENADRYL) 25 MG tablet Take 12.5 mg by mouth at bedtime.    ? fluticasone (FLONASE) 50 MCG/ACT nasal spray Place 2 sprays into both nostrils daily as needed for allergies.     ? methylPREDNISolone (MEDROL DOSEPAK) 4 MG TBPK tablet 6 day dose pack - take as directed 21 tablet 0  ? Multiple Vitamin (MULTIVITAMIN WITH MINERALS) TABS tablet Take 1 tablet by mouth in the morning.    ? Polyethyl Glycol-Propyl Glycol (LUBRICANT EYE DROPS) 0.4-0.3 % SOLN Place 1 drop into both eyes 3 (three) times daily as needed (dry/irritated eyes).    ? olmesartan (BENICAR) 20 MG tablet Take 1 tablet (20 mg total) by mouth daily. 90 tablet 0  ? ?No facility-administered medications prior to visit.  ? ? ?ROS ?Review of Systems  ?Constitutional: Negative.  Negative for appetite change, diaphoresis and fatigue.   ?HENT: Negative.    ?Respiratory:  Positive for shortness of breath. Negative for cough, chest tightness and wheezing.   ?Cardiovascular:  Negative for chest pain, palpitations and leg swelling.  ?Gastrointestinal:  Negative for abdominal pain, constipation, diarrhea, nausea and vomiting.  ?Genitourinary: Negative.  Negative for difficulty urinating and dysuria.  ?Musculoskeletal:  Positive for back pain.  ?Skin: Negative.   ?Neurological:  Negative for dizziness, weakness, light-headedness and headaches.  ?Hematological:  Negative for adenopathy. Does not bruise/bleed easily.  ?Psychiatric/Behavioral:  Negative for confusion, decreased concentration, dysphoric mood, sleep disturbance and suicidal ideas. The patient is nervous/anxious.   ? ?Objective:  ?BP 138/82 (BP Location: Left Arm, Patient Position: Sitting, Cuff Size: Large)   Pulse (!) 106   Temp 98.3 ?F (36.8 ?C) (Oral)   Resp 16   Ht '5\' 8"'$  (1.727 m)   Wt 150 lb (68 kg)   SpO2 97%   BMI 22.81 kg/m?  ? ?BP Readings from Last 3 Encounters:  ?09/04/21 138/82  ?07/31/21 (!) 145/91  ?06/04/21 (!) 154/90  ? ? ?Wt Readings from Last 3 Encounters:  ?09/04/21 150 lb (68 kg)  ?07/31/21 153 lb (69.4 kg)  ?07/17/21 153 lb (69.4 kg)  ? ? ?Physical Exam ?Vitals reviewed.  ?HENT:  ?   Nose: Nose normal.  ?   Mouth/Throat:  ?  Mouth: Mucous membranes are moist.  ?Eyes:  ?   General: No scleral icterus. ?   Conjunctiva/sclera: Conjunctivae normal.  ?Cardiovascular:  ?   Rate and Rhythm: Tachycardia present.  ?   Heart sounds: Murmur heard.  ?Systolic murmur is present with a grade of 2/6.  ?  No gallop.  ?   Comments: EKG- ?NSR, 94 bpm ?TWI in V1V2 is old ?No Q waves ?Pulmonary:  ?   Effort: Pulmonary effort is normal.  ?   Breath sounds: No stridor. No wheezing, rhonchi or rales.  ?Abdominal:  ?   General: Abdomen is flat.  ?   Palpations: There is no mass.  ?   Tenderness: There is no abdominal tenderness. There is no guarding.  ?   Hernia: No hernia is present.   ?Musculoskeletal:     ?   General: Normal range of motion.  ?   Cervical back: Neck supple.  ?   Right lower leg: No edema.  ?   Left lower leg: No edema.  ?Lymphadenopathy:  ?   Cervical: No cervical adenopathy.  ?Skin: ?   General: Skin is warm and dry.  ?Neurological:  ?   General: No focal deficit present.  ?   Mental Status: Mental status is at baseline.  ?Psychiatric:     ?   Mood and Affect: Mood normal.     ?   Behavior: Behavior normal.  ? ? ?Lab Results  ?Component Value Date  ? WBC 5.1 09/04/2021  ? HGB 15.1 09/04/2021  ? HCT 44.3 09/04/2021  ? PLT 250.0 09/04/2021  ? GLUCOSE 93 09/04/2021  ? CHOL 122 07/22/2021  ? TRIG 98 07/22/2021  ? HDL 60 07/22/2021  ? LDLCALC 44 07/22/2021  ? ALT 30 07/22/2021  ? AST 36 07/22/2021  ? NA 138 09/04/2021  ? K 5.1 09/04/2021  ? CL 102 09/04/2021  ? CREATININE 0.64 09/04/2021  ? BUN 10 09/04/2021  ? CO2 28 09/04/2021  ? TSH 1.430 04/17/2021  ? INR 1.2 05/03/2020  ? HGBA1C 5.7 (H) 04/17/2021  ? ? ?MR LUMBAR SPINE WO CONTRAST ? ?Result Date: 05/31/2021 ? [facility] [address], [place], [place] [address] [phone] NEUROIMAGING REPORT STUDY DATE: 05/30/2021 PATIENT NAME: DARROW BARREIRO DOB: [birth year] MRN: [id number] ORDERING CLINICIAN: Dr. Leta Baptist CLINICAL HISTORY: 61 year old male patient with bilateral foot numbness COMPARISON FILMS: None available EXAM: MRI lumbar spine without contrast TECHNIQUE: Sagittal T1, T2, STIR and axial T1 and T2 images were obtained through the lumbar spine. CONTRAST: None IMAGING SITE: [facility] FINDINGS: The lumbar vertebrae demonstrate scoliosis to the right with prominent disc degenerative changes throughout most prominent at L2-L3 where there is endplate marrow degenerative change with likely postop changes from previous L2-L3 discectomy with loss of disc height and likely herniated nucleus pulposus into the upper body of L3. T12-L1 shows loss of disc height with prominent facet hypertrophy and  ligamentum flavum hypertrophy resulting in mild posterior canal narrowing but no definite compression.  L1-L2 also shows mild disc degenerative change with prominent facet hypertrophy and mild ligamentum flavum hypertrophy resulting in mild bilateral foraminal and mild posterior canal narrowing but no definite compression.  L2-3 shows prior postop changes of microdiscectomy with herniated nucleus pulposus and asymmetric prominent facet hypertrophy and mild ligamentum flavum hypertrophy resulting in severe left greater than right foraminal narrowing and mild posterior canal narrowing.  L3/4 also shows severe disc degenerative change with disc osteophyte protrusion as well as facet  hypertrophy resulting in severe left greater than right foraminal and moderate posterior canal narrowing with likely encroachment on the exiting nerve root on the left.  L4-5 shows mild degenerative change but prominent facet and ligamentum flavum hypertrophy resulting in severe left-sided foraminal and moderate right-sided foraminal and moderate posterior canal narrowing with possible encroachment on the exiting nerve root on the left.  L5-S1 shows mild disc degenerative change and prominent facet hypertrophy resulting in moderate left and mild right-sided foraminal narrowing and possible encroachment on the S1 nerve root on the left.  Lower thoracic vertebrae also show prominent disc degenerative changes.  Conus medullaris ends at L1.  Visualized paraspinal soft tissue appear unremarkable.  ? ?MRI scan of the lumbar spine without contrast showing prominent disc and facet degenerative changes throughout most severe at L3-4 with very severe left greater than right foraminal narrowing and mild posterior canal narrowing.  L4-5 also shows severe left and moderate right foraminal narrowing and moderate posterior canal narrowing and possible encroachment on the exiting nerve root on the left.  L2/3 shows postoperative changes with mild posterior  canal and bilateral foraminal narrowing as well. INTERPRETING PHYSICIAN: Antony Contras, MD Certified in  Neuroimaging by [place] [employer] and [employer] ? ? ?Assessme

## 2021-09-12 ENCOUNTER — Ambulatory Visit: Payer: BC Managed Care – PPO | Admitting: Podiatry

## 2021-09-27 ENCOUNTER — Encounter: Payer: Self-pay | Admitting: Emergency Medicine

## 2021-09-27 ENCOUNTER — Ambulatory Visit
Admission: EM | Admit: 2021-09-27 | Discharge: 2021-09-27 | Disposition: A | Discharge status: Home / Self Care | Payer: BC Managed Care – PPO | Attending: Urgent Care | Admitting: Urgent Care

## 2021-09-27 DIAGNOSIS — M5136 Other intervertebral disc degeneration, lumbar region: Secondary | ICD-10-CM

## 2021-09-27 DIAGNOSIS — M48061 Spinal stenosis, lumbar region without neurogenic claudication: Secondary | ICD-10-CM | POA: Diagnosis not present

## 2021-09-27 DIAGNOSIS — M5416 Radiculopathy, lumbar region: Secondary | ICD-10-CM

## 2021-09-27 MED ORDER — PREDNISONE 50 MG PO TABS: 50.0000 mg | ORAL_TABLET | Freq: Every day | ORAL | 0 refills | Status: DC

## 2021-09-27 MED ORDER — TIZANIDINE HCL 4 MG PO TABS: 4.0000 mg | ORAL_TABLET | Freq: Every day | ORAL | 0 refills | Status: DC

## 2021-09-27 NOTE — ED Triage Notes (Addendum)
Pt here with sharp right leg pain originating from bottom of buttock. Does not radiate much, but comes on suddenly when standing. No mechanism of injury. Was squatting down often the hour before it started.  ?

## 2021-09-27 NOTE — ED Provider Notes (Signed)
?[place] ? ? ?MRN: [id number] DOB: [birth year] ? ?Subjective:  ? ?Philip Winters is a 61 y.o. male presenting for 2-day history of persistent right-sided buttock pain that goes into the top of his posterior thigh.  Symptoms started after he spent the day cleaning out his [employer], lifting heavy items.  Has a history of degenerative disc disease, spinal stenosis, bulging disks and he is supposed to have surgery on his back.  No particular fall, trauma, weakness, numbness or tingling, saddle paresthesia.  No changes to bowel or urinary habits.  No history of diabetes. ? ?No current facility-administered medications for this encounter. ? ?Current Outpatient Medications:  ?  acetaminophen (TYLENOL) 325 MG tablet, Take 2 tablets (650 mg total) by mouth every 6 (six) hours as needed for mild pain or moderate pain (pain score 1-3 or temp > 100.5)., Disp: 100 tablet, Rfl: 0 ?  Alirocumab (PRALUENT) 75 MG/ML SOAJ, Inject 1 pen into the skin every 14 (fourteen) days., Disp: 2 mL, Rfl: 11 ?  aspirin EC 81 MG tablet, Take 1 tablet (81 mg total) by mouth 2 (two) times daily after a meal. Swallow whole., Disp: 90 tablet, Rfl: 3 ?  clobetasol cream (TEMOVATE) 0.86 %, Apply 1 application. topically 2 (two) times daily., Disp: , Rfl:  ?  diltiazem (CARDIZEM CD) 240 MG 24 hr capsule, Take 240 mg by mouth daily., Disp: , Rfl:  ?  diphenhydrAMINE (BENADRYL) 25 MG tablet, Take 12.5 mg by mouth at bedtime., Disp: , Rfl:  ?  fluticasone (FLONASE) 50 MCG/ACT nasal spray, Place 2 sprays into both nostrils daily as needed for allergies. , Disp: , Rfl:  ?  methylPREDNISolone (MEDROL DOSEPAK) 4 MG TBPK tablet, 6 day dose pack - take as directed, Disp: 21 tablet, Rfl: 0 ?  Multiple Vitamin (MULTIVITAMIN WITH MINERALS) TABS tablet, Take 1 tablet by mouth in the morning., Disp: , Rfl:  ?  olmesartan (BENICAR) 20 MG tablet, Take 1 tablet (20 mg total) by mouth daily., Disp: 90 tablet, Rfl: 1 ?  Polyethyl Glycol-Propyl Glycol  (LUBRICANT EYE DROPS) 0.4-0.3 % SOLN, Place 1 drop into both eyes 3 (three) times daily as needed (dry/irritated eyes)., Disp: , Rfl:   ? ?Allergies  ?Allergen Reactions  ? Mushroom Extract Complex Anaphylaxis  ? Penicillins Shortness Of Breath  ?  Childhood allergy.  ?Has patient had a PCN reaction causing immediate rash, facial/tongue/throat swelling, SOB or lightheadedness with hypotension: No ?Has patient had a PCN reaction causing severe rash involving mucus membranes or skin necrosis: No ?Has patient had a PCN reaction that required hospitalization No ?Has patient had a PCN reaction occurring within the last 10 years: No ?If all of the above answers are "NO", then may proceed with C  ? Atorvastatin Other (See Comments)  ?  myalgia  ? Dilaudid [Hydromorphone] Nausea And Vomiting  ? Losartan Potassium Other (See Comments)  ?  Lethargic   ? Omega-3-Acid Ethyl Esters   ?  Other reaction(s): myalgia  ? Oxycodone Hcl   ?  Other reaction(s): hallucinations  ? Pravastatin Other (See Comments)  ?  myalgia  ? Red Dye   ?  Other reaction(s): rash  ? Rosuvastatin Other (See Comments)  ?  myalgia  ? Varenicline   ?  Other reaction(s): headaches ?Other reaction(s): headaches  ? Bystolic [Nebivolol Hcl] Other (See Comments)  ?  Extreme fatigue, not able to focus  ? Codeine Nausea And Vomiting  ? Iodine Rash  ? Iohexol  Rash  ? Metoprolol Other (See Comments)  ?  Made patient very fatigued, could not focus  ? Repatha [Evolocumab] Rash and Other (See Comments)  ?  Hip pain and rash  ? ? ?Past Medical History:  ?Diagnosis Date  ? Allergy   ? Arthritis   ? CAD (coronary artery disease)   ? Coronary artery disease   ? GERD (gastroesophageal reflux disease)   ? occ  ? Heart murmur   ? baby  ? High cholesterol   ? Hypertension   ? Peripheral neuropathy   ? Pneumonia   ? Right carotid bruit 06/10/2017  ? Sciatica   ? Seasonal allergies   ? Sinus tachycardia   ? Spinal stenosis   ?  ? ?Past Surgical History:  ?Procedure Laterality  Date  ? CARDIAC CATHETERIZATION    ? CARPAL TUNNEL WITH CUBITAL TUNNEL Left 11/10/2013  ? Procedure: LEFT CARPAL TUNNEL RELEASE;  Surgeon: Cammie Sickle, MD;  Location: [place];  Service: Orthopedics;  Laterality: Left;  ? COLONOSCOPY  2013  ? at [facility] [place]-normal exam per pt  ? CORONARY ARTERY BYPASS GRAFT N/A 05/01/2020  ? Procedure: CORONARY ARTERY BYPASS GRAFTING (CABG) X 2 ON CARDIOPULMONARY BYPASS. LIMA TO LAD, SVG TO DIAG;  Surgeon: Ivin Poot, MD;  Location: [place];  Service: Open Heart Surgery;  Laterality: N/A;  ? ENDOVEIN HARVEST OF GREATER SAPHENOUS VEIN Right 05/01/2020  ? Procedure: ENDOVEIN HARVEST OF GREATER SAPHENOUS VEIN;  Surgeon: Ivin Poot, MD;  Location: [place];  Service: Open Heart Surgery;  Laterality: Right;  ? KNEE ARTHROSCOPY  [id number]  ? left and right  ? LUMBAR LAMINECTOMY/DECOMPRESSION MICRODISCECTOMY Left 09/29/2017  ? Procedure: LEFT LUMBAR TWO - LUMBAR THREELAMINOTOMY/MICRODISCECTOMY;  Surgeon: Jovita Gamma, MD;  Location: [place];  Service: Neurosurgery;  Laterality: Left;  LEFT LUMBAR 2- LUMBAR 3 LAMINOTOMY/MICRODISCECTOMY  ? NASAL SINUS SURGERY  05/2015  ? RIGHT/LEFT HEART CATH AND CORONARY ANGIOGRAPHY N/A 04/10/2020  ? Procedure: RIGHT/LEFT HEART CATH AND CORONARY ANGIOGRAPHY;  Surgeon: Troy Sine, MD;  Location: [place] [facility];  Service: Cardiovascular;  Laterality: N/A;  ? TEE WITHOUT CARDIOVERSION N/A 05/01/2020  ? Procedure: TRANSESOPHAGEAL ECHOCARDIOGRAM (TEE);  Surgeon: Prescott Gum, Collier Salina, MD;  Location: [place];  Service: Open Heart Surgery;  Laterality: N/A;  ? TOTAL KNEE ARTHROPLASTY Right 06/24/2016  ? Procedure: TOTAL KNEE ARTHROPLASTY;  Surgeon: Garald Balding, MD;  Location: [place];  Service: Orthopedics;  Laterality: Right;  ? TOTAL KNEE ARTHROPLASTY Left 12/18/2020  ? Procedure: LEFT TOTAL KNEE ARTHROPLASTY;  Surgeon: Garald Balding, MD;  Location: [facility];  Service: Orthopedics;  Laterality: Left;  ? TRIGGER  FINGER RELEASE Left 11/10/2013  ? Procedure: LEFT INDEX A-1 PULLEY RELEASE;  Surgeon: Cammie Sickle, MD;  Location: [place];  Service: Orthopedics;  Laterality: Left;  ? ULNAR NERVE TRANSPOSITION Left 11/10/2013  ? Procedure: ULNAR NERVE TRANSPOSITION;  Surgeon: Cammie Sickle, MD;  Location: [place];  Service: Orthopedics;  Laterality: Left;  ? ? ?Family History  ?Problem Relation Age of Onset  ? Hypertension Mother   ? Throat cancer Mother   ? Hypertension Father   ? Cancer Father   ? Colon cancer Neg Hx   ? Esophageal cancer Neg Hx   ? Stomach cancer Neg Hx   ? Rectal cancer Neg Hx   ? Colon polyps Neg Hx   ? ? ?Social History  ? ?Tobacco Use  ?  Smoking status: Former  ?  Packs/day: 1.00  ?  Years: 37.00  ?  Pack years: 37.00  ?  Types: Cigarettes  ?  Quit date: 05/04/2016  ?  Years since quitting: 5.4  ? Smokeless tobacco: Never  ?Vaping Use  ? Vaping Use: Former  ?Substance Use Topics  ? Alcohol use: Yes  ?  Alcohol/week: 5.0 standard drinks  ?  Types: 5 Glasses of wine per week  ?  Comment: weekends only with dinner  ? Drug use: No  ? ? ?ROS ? ? ?Objective:  ? ?Vitals: ?BP (!) 146/85   Pulse (!) 122   Temp 98.3 ?F (36.8 ?C)   Resp 20   SpO2 98%  ? ?Physical Exam ?Constitutional:   ?   General: He is not in acute distress. ?   Appearance: Normal appearance. He is well-developed and normal weight. He is not ill-appearing, toxic-appearing or diaphoretic.  ?HENT:  ?   Head: Normocephalic and atraumatic.  ?   Right Ear: External ear normal.  ?   Left Ear: External ear normal.  ?   Nose: Nose normal.  ?   Mouth/Throat:  ?   Pharynx: Oropharynx is clear.  ?Eyes:  ?   General: No scleral icterus.    ?   Right eye: No discharge.     ?   Left eye: No discharge.  ?   Extraocular Movements: Extraocular movements intact.  ?Cardiovascular:  ?   Rate and Rhythm: Normal rate.  ?Pulmonary:  ?   Effort: Pulmonary effort is normal.  ?Musculoskeletal:  ?   Cervical back: Normal  range of motion.  ?   Lumbar back: No swelling, edema, deformity, signs of trauma, lacerations, spasms, tenderness or bony tenderness. Normal range of motion. Positive right straight leg raise test. Negative left st

## 2021-10-02 ENCOUNTER — Encounter: Payer: Self-pay | Admitting: Acute Care

## 2021-10-02 ENCOUNTER — Ambulatory Visit (INDEPENDENT_AMBULATORY_CARE_PROVIDER_SITE_OTHER): Payer: BC Managed Care – PPO | Admitting: Acute Care

## 2021-10-02 DIAGNOSIS — Z87891 Personal history of nicotine dependence: Secondary | ICD-10-CM | POA: Diagnosis not present

## 2021-10-02 NOTE — Patient Instructions (Signed)
Thank you for participating in the [facility]. It was our pleasure to meet you today. We will call you with the results of your scan within the next few days. Your scan will be assigned a Lung RADS category score by the physicians reading the scans.  This Lung RADS score determines follow up scanning.  See below for description of categories, and follow up screening recommendations. We will be in touch to schedule your follow up screening annually or based on recommendations of our providers. We will fax a copy of your scan results to your Primary Care Physician, or the physician who referred you to the program, to ensure they have the results. Please call the office if you have any questions or concerns regarding your scanning experience or results.  Our office number is [phone]. Please speak with Denise Phelps, RN. , or  Denise Buckner RN, They are  our Lung Cancer Screening RN.'s If They are unavailable when you call, Please leave a message on the voice mail. We will return your call at our earliest convenience.This voice mail is monitored several times a day.  Remember, if your scan is normal, we will scan you annually as long as you continue to meet the criteria for the program. (Age 55-77, Current smoker or smoker who has quit within the last 15 years). If you are a smoker, remember, quitting is the single most powerful action that you can take to decrease your risk of lung cancer and other pulmonary, breathing related problems. We know quitting is hard, and we are here to help.  Please let us know if there is anything we can do to help you meet your goal of quitting. If you are a former smoker, congratulations. We are proud of you! Remain smoke free! Remember you can refer friends or family members through the number above.  We will screen them to make sure they meet criteria for the program. Thank you for helping us take better care of you by  participating in Lung Screening.  You can receive free nicotine replacement therapy ( patches, gum or mints) by calling 1-800-QUIT NOW. Please call so we can get you on the path to becoming  a non-smoker. I know it is hard, but you can do this!  Lung RADS Categories:  Lung RADS 1: no nodules or definitely non-concerning nodules.  Recommendation is for a repeat annual scan in 12 months.  Lung RADS 2:  nodules that are non-concerning in appearance and behavior with a very low likelihood of becoming an active cancer. Recommendation is for a repeat annual scan in 12 months.  Lung RADS 3: nodules that are probably non-concerning , includes nodules with a low likelihood of becoming an active cancer.  Recommendation is for a 6-month repeat screening scan. Often noted after an upper respiratory illness. We will be in touch to make sure you have no questions, and to schedule your 6-month scan.  Lung RADS 4 A: nodules with concerning findings, recommendation is most often for a follow up scan in 3 months or additional testing based on our provider's assessment of the scan. We will be in touch to make sure you have no questions and to schedule the recommended 3 month follow up scan.  Lung RADS 4 B:  indicates findings that are concerning. We will be in touch with you to schedule additional diagnostic testing based on our provider's  assessment of the scan.  Other options for assistance in smoking cessation (   As covered by your insurance benefits)  Hypnosis for smoking cessation  [employer] [phone]  Acupuncture for smoking cessation  [facility] [phone]   

## 2021-10-02 NOTE — Progress Notes (Signed)
Virtual Visit via Telephone Note ? ?I connected with Philip Winters on 10/02/21 at 10:30 AM EDT by telephone and verified that I am speaking with the correct person using two identifiers. ? ?Location: ?Patient:  At home ?Provider: [place], [place], [place], [address]  ?  ?I discussed the limitations, risks, security and privacy concerns of performing an evaluation and management service by telephone and the availability of in person appointments. I also discussed with the patient that there may be a patient responsible charge related to this service. The patient expressed understanding and agreed to proceed. ? ? ? ?Shared Decision Making Visit Lung Cancer Screening Program ?([phone]) ? ? ?Eligibility: ?Age 61 y.o. ?Pack Years Smoking History Calculation 82 pack year smoking history ?(# packs/per year x # years smoked) ?Recent History of coughing up blood  no ?Unexplained weight loss? no ?( >Than 15 pounds within the last 6 months ) ?Prior History Lung / other cancer no ?(Diagnosis within the last 5 years already requiring surveillance chest CT Scans). ?Smoking Status Former Smoker ?Former Smokers: Years since quit: 6 years ago ? Quit Date: 05/04/2016 ? ?Visit Components: ?Discussion included one or more decision making aids. yes ?Discussion included risk/benefits of screening. yes ?Discussion included potential follow up diagnostic testing for abnormal scans. yes ?Discussion included meaning and risk of over diagnosis. yes ?Discussion included meaning and risk of False Positives. yes ?Discussion included meaning of total radiation exposure. yes ? ?Counseling Included: ?Importance of adherence to annual lung cancer LDCT screening. yes ?Impact of comorbidities on ability to participate in the program. yes ?Ability and willingness to under diagnostic treatment. yes ? ?Smoking Cessation Counseling: ?Current Smokers:  ?Discussed importance of smoking cessation. yes ?Information about tobacco cessation classes and  interventions provided to patient. yes ?Patient provided with "ticket" for LDCT Scan. yes ?Symptomatic Patient. no ? Counseling NA ?Diagnosis Code: Tobacco Use Z72.0 ?Asymptomatic Patient yes ? Counseling (Intermediate counseling: > three minutes counseling) N0272 ?Former Smokers:  ?Discussed the importance of maintaining cigarette abstinence. yes ?Diagnosis Code: Personal History of Nicotine Dependence. Z36.644 ?Information about tobacco cessation classes and interventions provided to patient. Yes ?Patient provided with "ticket" for LDCT Scan. yes ?Written Order for Lung Cancer Screening with LDCT placed in Epic. Yes ?(CT Chest Lung Cancer Screening Low Dose W/O CM) [id number] ?Z12.2-Screening of respiratory organs ?Z87.891-Personal history of nicotine dependence ? ?I spent 25 minutes of face to face time/virtual visit time  with  Philip Winters discussing the risks and benefits of lung cancer screening. We took the time to pause the power point at intervals to allow for questions to be asked and answered to ensure understanding. We discussed that he had taken the single most powerful action possible to decrease his risk of developing lung cancer when he quit smoking. I counseled him to remain smoke free, and to contact me if he ever had the desire to smoke again so that I can provide resources and tools to help support the effort to remain smoke free. We discussed the time and location of the scan, and that either  Doroteo Glassman RN, Joella Prince, RN or I  or I will call / send a letter with the results within  24-72 hours of receiving them. He has the office contact information in the event he needs to speak with me,  he verbalized understanding of all of the above and had no further questions upon leaving the office.  ? ? ? ?I explained to the patient that there  has been a high incidence of coronary artery disease noted on these exams. I explained that this is a non-gated exam therefore degree or severity cannot be  determined. This patient is not on statin therapy. I have asked the patient to follow-up with their PCP regarding any incidental finding of coronary artery disease and management with diet or medication as they feel is clinically indicated. The patient verbalized understanding of the above and had no further questions. ?  ? ? ?Magdalen Spatz, NP ?10/02/2021 ? ? ? ? ? ? ?

## 2021-10-03 ENCOUNTER — Ambulatory Visit (INDEPENDENT_AMBULATORY_CARE_PROVIDER_SITE_OTHER)
Admission: RE | Admit: 2021-10-03 | Discharge: 2021-10-03 | Disposition: A | Discharge status: Home / Self Care | Payer: BC Managed Care – PPO | Source: Ambulatory Visit | Attending: Acute Care | Admitting: Acute Care

## 2021-10-03 DIAGNOSIS — Z87891 Personal history of nicotine dependence: Secondary | ICD-10-CM

## 2021-10-03 DIAGNOSIS — Z122 Encounter for screening for malignant neoplasm of respiratory organs: Secondary | ICD-10-CM

## 2021-10-03 IMAGING — CT CT CHEST LUNG CANCER SCREENING LOW DOSE W/O CM
2 of 5 series · 15 of 40 positions shown, 18 images · non-contrast
Comparison: [DATE] diagnostic CT.

CLINICAL DATA: Eighty-two pack-year smoking history/quit 6 years
ago



[Series 3: lung thins 1.0 · axial · 0.73mm/px · z∈[-359,-52]mm · 12 of 339 slices shown, 15 images]
[im 16/339  mediastinal]
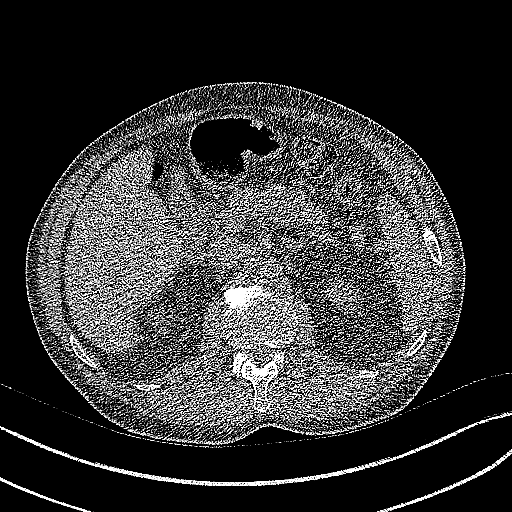
[im 16/339  lung]
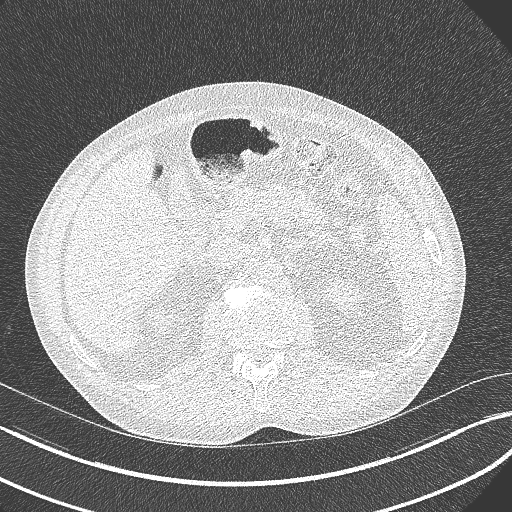
[im 47/339  lung]
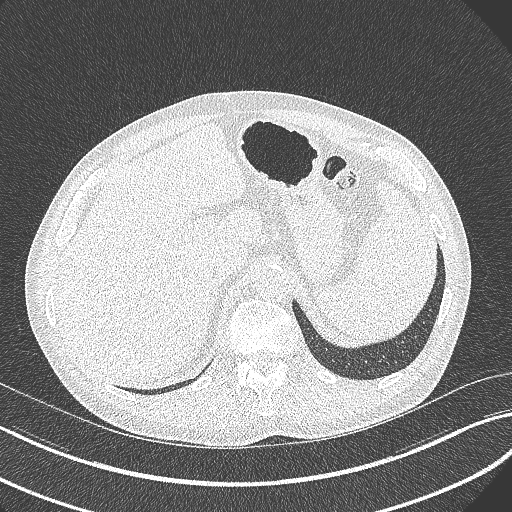
[im 77/339  lung]
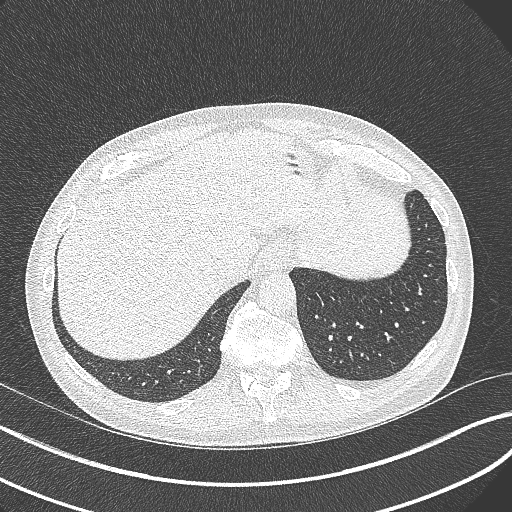
[im 108/339  lung]
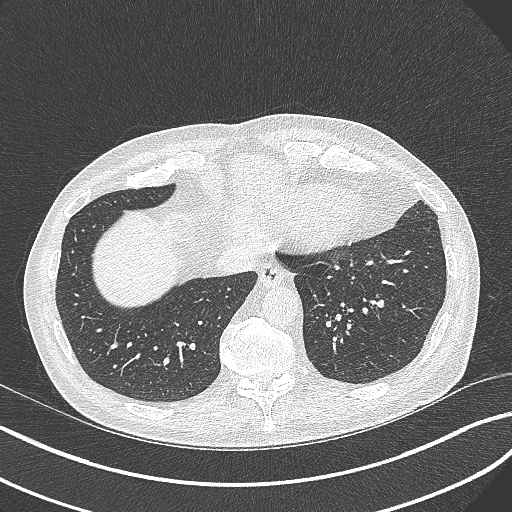
[im 123/339  mediastinal]
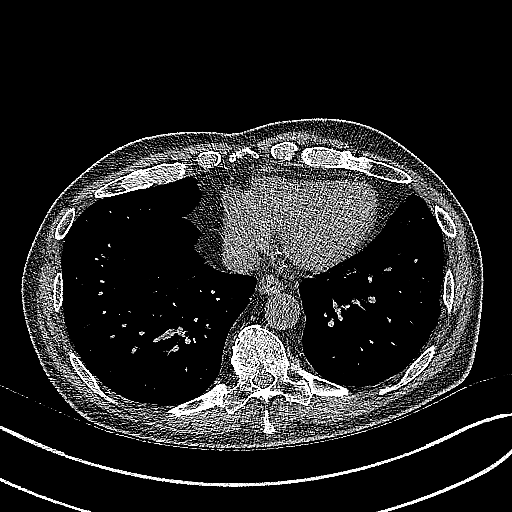
[im 123/339  lung]
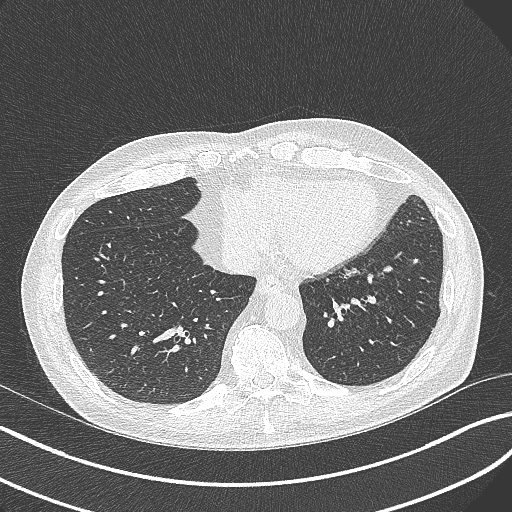
[im 154/339  lung]
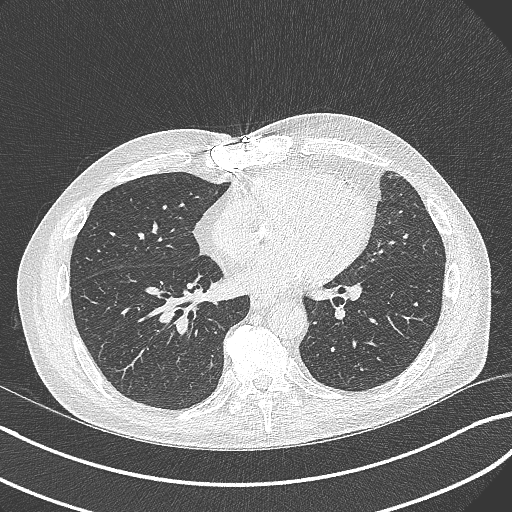
[im 185/339  lung]
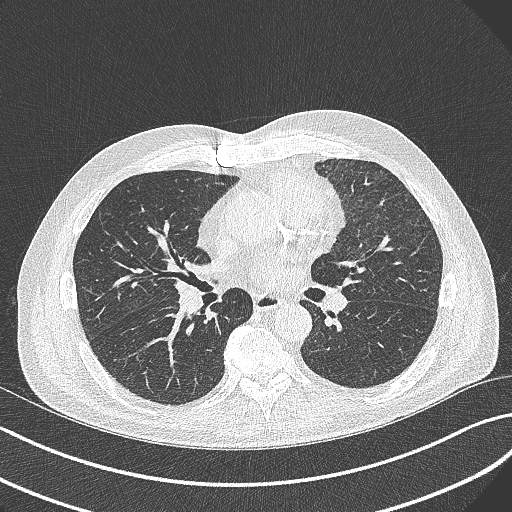
[im 216/339  lung]
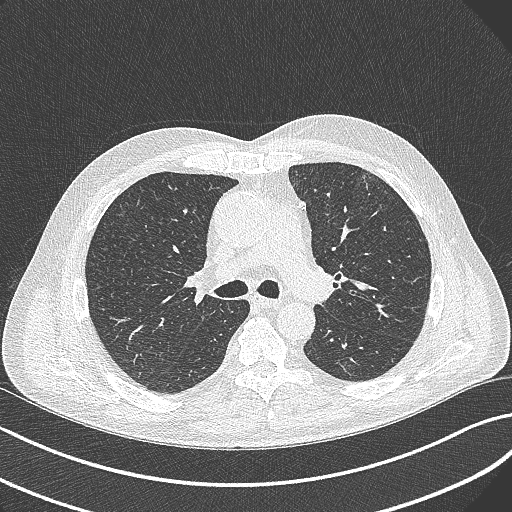
[im 231/339  mediastinal]
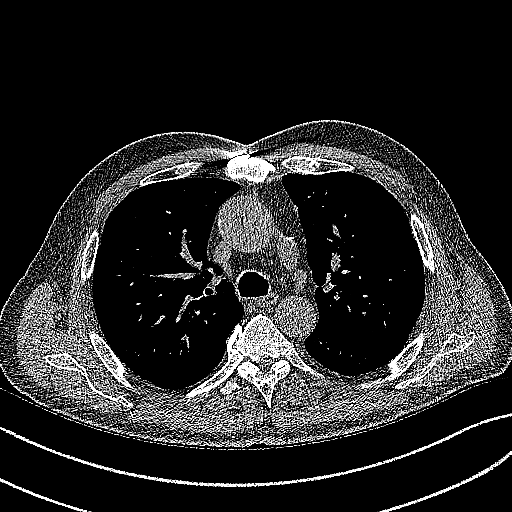
[im 231/339  lung]
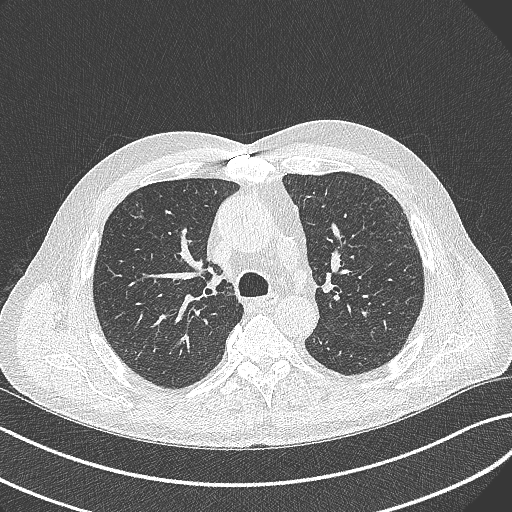
[im 262/339  lung]
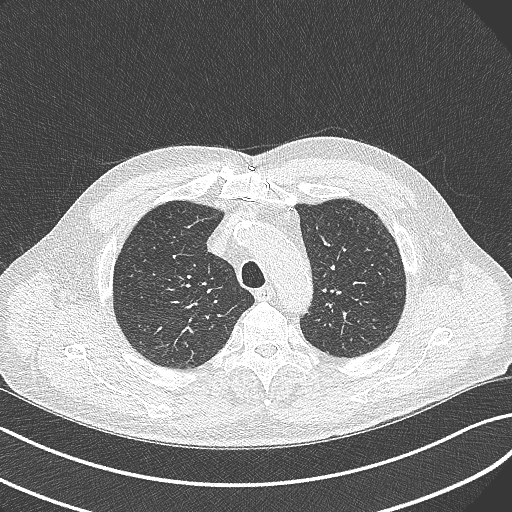
[im 292/339  lung]
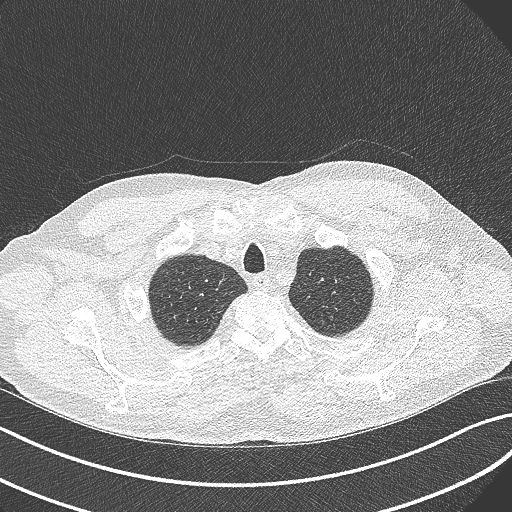
[im 323/339  lung]
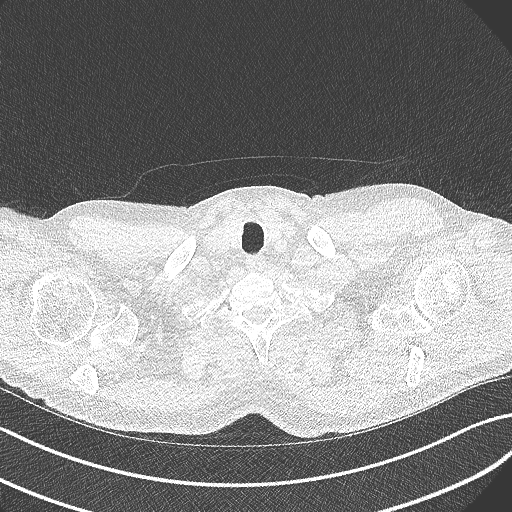

[Series 5: coronal · coronal · 0.67mm/px · 3 of 120 slices shown]
[im 24/120  lung]
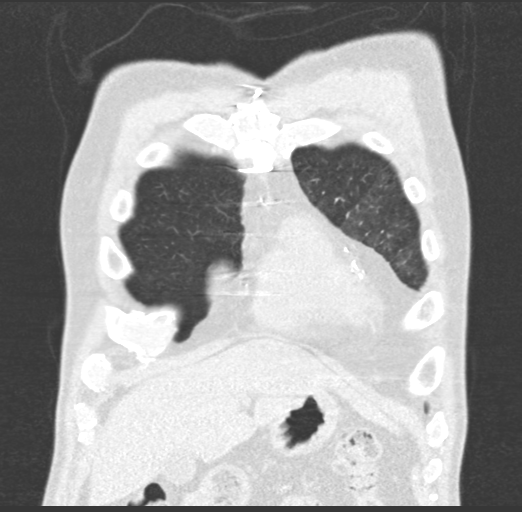
[im 48/120  lung]
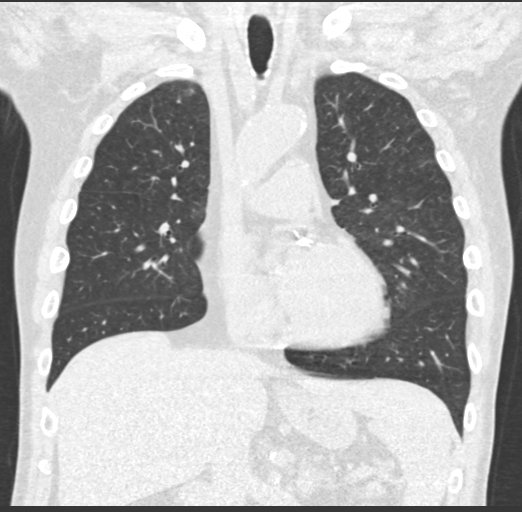
[im 72/120  lung]
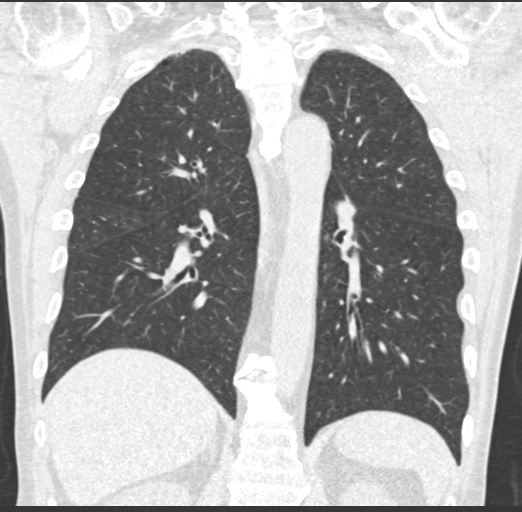

[15 of 40 positions shown; findings below may reference images not displayed]

FINDINGS: Cardiovascular: Median sternotomy for CABG.

Similar borderline to mild ascending aortic dilatation at 4.1 cm.
Aortic atherosclerosis. Normal heart size, without pericardial
effusion. Aortic valve calcification.

Mediastinum/Nodes: No mediastinal or definite hilar adenopathy,
given limitations of unenhanced CT.

Lungs/Pleura: No pleural fluid. Mild centrilobular emphysema.
Right-sided pulmonary nodules of maximally volume derived equivalent
diameter 4.9 mm.

Upper Abdomen: Normal imaged portions of the liver, spleen, stomach,
pancreas, adrenal glands, kidneys.

Musculoskeletal: Prior median sternotomy. Lower thoracic
spondylosis.
IMPRESSION: 1. Lung-RADS 2, benign appearance or behavior. Continue annual
screening with low-dose chest CT without contrast in 12 months.
2. Aortic Atherosclerosis ([CQ]-[CQ]) and Emphysema ([CQ]-[CQ]).
3. Similar ascending aortic dilatation at on the order of 4.1 cm.
Recommend annual imaging followup by CTA or MRA. This recommendation
follows [CQ] ACCF/AHA/AATS/ACR/ASA/SCA/LIEN/LIEN/LIEN/LIEN Guidelines
for the Diagnosis and Management of Patients with Thoracic Aortic
Disease. Circulation. [CQ]; 121: E266-e369. Aortic aneurysm NOS
([CQ]-[CQ])
4. Aortic valvular calcifications. Consider echocardiography to
evaluate for valvular dysfunction.

## 2021-10-07 ENCOUNTER — Telehealth: Payer: Self-pay | Admitting: Acute Care

## 2021-10-07 DIAGNOSIS — Z87891 Personal history of nicotine dependence: Secondary | ICD-10-CM

## 2021-10-07 DIAGNOSIS — Z122 Encounter for screening for malignant neoplasm of respiratory organs: Secondary | ICD-10-CM

## 2021-10-07 NOTE — Telephone Encounter (Signed)
Pt informed of CT results per Eric Form, NP.  Pt advised that no suspicious nodules were seen and the aortic aneurysm is stable in size. PT verbalized understanding.  Copy of CT sent to PCP and Dr Marlou Porch Order placed for 1 yr f/u CT.

## 2021-10-10 ENCOUNTER — Encounter: Payer: Self-pay | Admitting: Emergency Medicine

## 2021-10-10 ENCOUNTER — Ambulatory Visit
Admission: EM | Admit: 2021-10-10 | Discharge: 2021-10-10 | Disposition: A | Discharge status: Home / Self Care | Payer: BC Managed Care – PPO | Attending: Emergency Medicine | Admitting: Emergency Medicine

## 2021-10-10 DIAGNOSIS — R61 Generalized hyperhidrosis: Secondary | ICD-10-CM | POA: Diagnosis not present

## 2021-10-10 DIAGNOSIS — R509 Fever, unspecified: Secondary | ICD-10-CM | POA: Diagnosis not present

## 2021-10-10 DIAGNOSIS — R531 Weakness: Secondary | ICD-10-CM

## 2021-10-10 DIAGNOSIS — B349 Viral infection, unspecified: Secondary | ICD-10-CM | POA: Diagnosis not present

## 2021-10-10 DIAGNOSIS — Q231 Congenital insufficiency of aortic valve: Secondary | ICD-10-CM

## 2021-10-10 DIAGNOSIS — Z951 Presence of aortocoronary bypass graft: Secondary | ICD-10-CM

## 2021-10-10 NOTE — ED Triage Notes (Signed)
Pt here with cough, congestion and low grade fevers x 2 days.

## 2021-10-10 NOTE — ED Provider Notes (Addendum)
[facility]    CSN: [id number] Arrival date & time: 10/10/21  0102    HISTORY   Chief Complaint  Patient presents with   Cough   Nasal Congestion   Fever   HPI Philip Winters is a 61 y.o. male. Pt here with cough, congestion and low grade fevers x 2 days.  Patient states his blood pressure is lower than it normally is, states is usually 130s over 80s.  Patient states his heart rate is also little slower than it usually is, states is usually around 104.  Patient states he has been feeling weak and breaking out in cold sweats intermittently with low-grade temps in the upper 90s, low 100s.  Patient states been taking some Tylenol here and there.  Patient denies known sick contacts.  Patient denies nausea, vomiting, diarrhea, loss of taste or smell, productive cough, sore throat, otalgia.  Patient endorses intermittent dizziness as well.  Patient denies chest pain, shortness of breath.  Patient reports history of cardiac bypass few years ago.  Patient states currently taking diltiazem and olmesartan, states his cardiologist recently increased his olmesartan from 10 mg to 20 mg.  Patient states he is unsure why.  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  EMR reviewed, patient has a bicuspid aortic valve, patient states was advised by his cardiothoracic surgeon that the murmur could not be fixed.  The history is provided by the patient.  Past Medical History:  Diagnosis Date   Allergy    Arthritis    CAD (coronary artery disease)    Coronary artery disease    GERD (gastroesophageal reflux disease)    occ   Heart murmur    baby   High cholesterol    Hypertension    Peripheral neuropathy    Pneumonia    Right carotid bruit 06/10/2017   Sciatica    Seasonal allergies    Sinus tachycardia    Spinal stenosis    Patient Active Problem List   Diagnosis Date Noted   DOE (dyspnea on exertion) 09/04/2021   Cervical radiculopathy 08/13/2021   Neck  pain 08/13/2021   Ulnar neuropathy 08/13/2021   Weakness of both upper extremities 08/13/2021   Screen for colon cancer 06/06/2021   Chronic tachycardia 06/04/2021   Candidal balanitis 06/04/2021   Carpal tunnel syndrome, bilateral upper limbs 09/26/2020   Cubital tunnel syndrome on left 08/23/2020   Pulmonary nodule 1 cm or greater in diameter 04/26/2020   Statin intolerance 04/20/2020   Coronary atherosclerosis of native coronary artery 04/18/2020   Nonrheumatic aortic valve stenosis    Bicuspid aortic valve 04/06/2020   Primary osteoarthritis of left knee 11/08/2019   Bilateral primary osteoarthritis of knee 09/28/2019   HNP (herniated nucleus pulposus), lumbar 09/29/2017   Weakness of left lower extremity 09/18/2017   Backache 09/08/2017   Unilateral primary osteoarthritis, right knee 06/24/2016   Primary hypertension 01/18/2007   Past Surgical History:  Procedure Laterality Date   CARDIAC CATHETERIZATION     CARPAL TUNNEL WITH CUBITAL TUNNEL Left 11/10/2013   Procedure: LEFT CARPAL TUNNEL RELEASE;  Surgeon: Cammie Sickle, MD;  Location: [place];  Service: Orthopedics;  Laterality: Left;   COLONOSCOPY  2013   at [facility], [place]-normal exam per pt   CORONARY ARTERY BYPASS GRAFT N/A 05/01/2020   Procedure: CORONARY ARTERY BYPASS GRAFTING (CABG) X 2 ON CARDIOPULMONARY BYPASS. LIMA TO LAD, SVG TO DIAG;  Surgeon: Ivin Poot, MD;  Location[facility];  Service: Open Heart Surgery;  Laterality: N/A;   ENDOVEIN HARVEST OF GREATER SAPHENOUS VEIN Right 05/01/2020   Procedure: ENDOVEIN HARVEST OF GREATER SAPHENOUS VEIN;  Surgeon: Ivin Poot, MD;  Location: [place];  Service: Open Heart Surgery;  Laterality: Right;   KNEE ARTHROSCOPY  [id number]   left and right   LUMBAR LAMINECTOMY/DECOMPRESSION MICRODISCECTOMY Left 09/29/2017   Procedure: LEFT LUMBAR TWO - LUMBAR THREELAMINOTOMY/MICRODISCECTOMY;  Surgeon: Jovita Gamma, MD;  Location: [place];  Service:  Neurosurgery;  Laterality: Left;  LEFT LUMBAR 2- LUMBAR 3 LAMINOTOMY/MICRODISCECTOMY   NASAL SINUS SURGERY  05/2015   RIGHT/LEFT HEART CATH AND CORONARY ANGIOGRAPHY N/A 04/10/2020   Procedure: RIGHT/LEFT HEART CATH AND CORONARY ANGIOGRAPHY;  Surgeon: Troy Sine, MD;  Location: [place] [facility];  Service: Cardiovascular;  Laterality: N/A;   TEE WITHOUT CARDIOVERSION N/A 05/01/2020   Procedure: TRANSESOPHAGEAL ECHOCARDIOGRAM (TEE);  Surgeon: Prescott Gum, Collier Salina, MD;  Location: [place];  Service: Open Heart Surgery;  Laterality: N/A;   TOTAL KNEE ARTHROPLASTY Right 06/24/2016   Procedure: TOTAL KNEE ARTHROPLASTY;  Surgeon: Garald Balding, MD;  Location: [place];  Service: Orthopedics;  Laterality: Right;   TOTAL KNEE ARTHROPLASTY Left 12/18/2020   Procedure: LEFT TOTAL KNEE ARTHROPLASTY;  Surgeon: Garald Balding, MD;  Location: [facility];  Service: Orthopedics;  Laterality: Left;   TRIGGER FINGER RELEASE Left 11/10/2013   Procedure: LEFT INDEX A-1 PULLEY RELEASE;  Surgeon: Cammie Sickle, MD;  Location: [place];  Service: Orthopedics;  Laterality: Left;   ULNAR NERVE TRANSPOSITION Left 11/10/2013   Procedure: ULNAR NERVE TRANSPOSITION;  Surgeon: Cammie Sickle, MD;  Location: [place];  Service: Orthopedics;  Laterality: Left;    Home Medications    Prior to Admission medications   Medication Sig Start Date End Date Taking? Authorizing Provider  acetaminophen (TYLENOL) 325 MG tablet Take 2 tablets (650 mg total) by mouth every 6 (six) hours as needed for mild pain or moderate pain (pain score 1-3 or temp > 100.5). 12/19/20   Petrarca, Mike Craze, PA-C  Alirocumab (PRALUENT) 75 MG/ML SOAJ Inject 1 pen into the skin every 14 (fourteen) days. 05/27/21   Jerline Pain, MD  aspirin EC 81 MG tablet Take 1 tablet (81 mg total) by mouth 2 (two) times daily after a meal. Swallow whole. 12/19/20   Cherylann Ratel, PA-C  clobetasol cream (TEMOVATE) 0.81 % Apply 1  application. topically 2 (two) times daily. 08/12/21   [provider]  diltiazem (CARDIZEM CD) 240 MG 24 hr capsule Take 240 mg by mouth daily.    [provider]  diphenhydrAMINE (BENADRYL) 25 MG tablet Take 12.5 mg by mouth at bedtime.    [provider]  fluticasone (FLONASE) 50 MCG/ACT nasal spray Place 2 sprays into both nostrils daily as needed for allergies.     [provider]  methylPREDNISolone (MEDROL DOSEPAK) 4 MG TBPK tablet 6 day dose pack - take as directed 08/13/21   Hyatt, Max T, DPM  Multiple Vitamin (MULTIVITAMIN WITH MINERALS) TABS tablet Take 1 tablet by mouth in the morning.    [provider]  olmesartan (BENICAR) 20 MG tablet Take 1 tablet (20 mg total) by mouth daily. 09/04/21   Janith Lima, MD  Polyethyl Glycol-Propyl Glycol (LUBRICANT EYE DROPS) 0.4-0.3 % SOLN Place 1 drop into both eyes 3 (three) times daily as needed (dry/irritated eyes).    [provider]  predniSONE (DELTASONE)  50 MG tablet Take 1 tablet (50 mg total) by mouth daily with breakfast. 09/27/21   Jaynee Eagles, PA-C  tiZANidine (ZANAFLEX) 4 MG tablet Take 1 tablet (4 mg total) by mouth at bedtime. 09/27/21   Jaynee Eagles, PA-C   Family History Family History  Problem Relation Age of Onset   Hypertension Mother    Throat cancer Mother    Hypertension Father    Cancer Father    Colon cancer Neg Hx    Esophageal cancer Neg Hx    Stomach cancer Neg Hx    Rectal cancer Neg Hx    Colon polyps Neg Hx    Social History Social History   Tobacco Use   Smoking status: Former    Packs/day: 1.00    Years: 37.00    Pack years: 37.00    Types: Cigarettes    Quit date: 05/04/2016    Years since quitting: 5.4   Smokeless tobacco: Never  Vaping Use   Vaping Use: Former  Substance Use Topics   Alcohol use: Yes    Alcohol/week: 5.0 standard drinks    Types: 5 Glasses of wine per week    Comment: weekends only with dinner   Drug use: No    Allergies   Mushroom extract complex, Penicillins, Atorvastatin, Dilaudid [hydromorphone], Losartan potassium, Omega-3-acid ethyl esters, Oxycodone hcl, Pravastatin, Red dye, Rosuvastatin, Varenicline, Bystolic [nebivolol hcl], Codeine, Iodine, Iohexol, Metoprolol, and Repatha [evolocumab]  Review of Systems Review of Systems Pertinent findings noted in history of present illness.   Physical Exam Triage Vital Signs ED Triage Vitals  Enc Vitals Group     BP 03/15/21 0827 (!) 147/82     Pulse Rate 03/15/21 0827 72     Resp 03/15/21 0827 18     Temp 03/15/21 0827 98.3 F (36.8 C)     Temp Source 03/15/21 0827 Oral     SpO2 03/15/21 0827 98 %     Weight --      Height --      Head Circumference --      Peak Flow --      Pain Score 03/15/21 0826 5     Pain Loc --      Pain Edu? --      Excl. in [place]? --   No data found.  Updated Vital Signs BP 112/67   Pulse 96   Temp 99.5 F (37.5 C)   Resp 20   SpO2 96%   Physical Exam Vitals and nursing note reviewed.  Constitutional:      General: He is not in acute distress.    Appearance: Normal appearance. He is not ill-appearing.  HENT:     Head: Normocephalic and atraumatic.     Salivary Glands: Right salivary gland is not diffusely enlarged or tender. Left salivary gland is not diffusely enlarged or tender.     Right Ear: Tympanic membrane, ear canal and external ear normal. No drainage. No middle ear effusion. There is no impacted cerumen. Tympanic membrane is not erythematous or bulging.     Left Ear: Tympanic membrane, ear canal and external ear normal. No drainage.  No middle ear effusion. There is no impacted cerumen. Tympanic membrane is not erythematous or bulging.     Nose: Nose normal. No nasal deformity, septal deviation, mucosal edema, congestion or rhinorrhea.     Right Turbinates: Not enlarged, swollen or pale.     Left Turbinates: Not enlarged, swollen or pale.     Right Sinus:  No maxillary sinus tenderness or  frontal sinus tenderness.     Left Sinus: No maxillary sinus tenderness or frontal sinus tenderness.     Mouth/Throat:     Lips: Pink. No lesions.     Mouth: Mucous membranes are moist. No oral lesions.     Pharynx: Oropharynx is clear. Uvula midline. No posterior oropharyngeal erythema or uvula swelling.     Tonsils: No tonsillar exudate. 0 on the right. 0 on the left.  Eyes:     General: Lids are normal.        Right eye: No discharge.        Left eye: No discharge.     Extraocular Movements: Extraocular movements intact.     Conjunctiva/sclera: Conjunctivae normal.     Right eye: Right conjunctiva is not injected.     Left eye: Left conjunctiva is not injected.  Neck:     Trachea: Trachea and phonation normal.  Cardiovascular:     Rate and Rhythm: Normal rate and regular rhythm.     Pulses: Normal pulses.     Heart sounds: S1 normal and S2 normal. Murmur heard.  Systolic murmur is present with a grade of 2/6.    No friction rub. No gallop. No S3 or S4 sounds.  Pulmonary:     Effort: Pulmonary effort is normal. No accessory muscle usage, prolonged expiration or respiratory distress.     Breath sounds: Normal breath sounds. No stridor, decreased air movement or transmitted upper airway sounds. No decreased breath sounds, wheezing, rhonchi or rales.  Chest:     Chest wall: No tenderness.  Musculoskeletal:        General: Normal range of motion.     Cervical back: Normal range of motion and neck supple. Normal range of motion.  Lymphadenopathy:     Cervical: No cervical adenopathy.  Skin:    General: Skin is warm and dry.     Findings: No erythema or rash.  Neurological:     General: No focal deficit present.     Mental Status: He is alert and oriented to person, place, and time.  Psychiatric:        Mood and Affect: Mood normal.        Behavior: Behavior normal.    Visual Acuity Right Eye Distance:   Left Eye Distance:   Bilateral Distance:    Right Eye Near:   Left  Eye Near:    Bilateral Near:     [facility] / Diagnostics / Procedures:    EKG  Radiology No results found.  Procedures ED EKG  Date/Time: 10/10/2021 11:10 AM Performed by: Lynden Oxford Scales, PA-C Authorized by: Lynden Oxford Scales, PA-C   Previous ECG:    Previous ECG:  Compared to current Interpretation:    Interpretation: normal   Rate:    ECG rate assessment: normal   Rhythm:    Rhythm: sinus rhythm   Ectopy:    Ectopy: none   QRS:    QRS axis:  Normal   QRS intervals:  Normal   QRS conduction: normal   ST segments:    ST segments:  Normal T waves:    T waves: normal   Q waves:    Abnormal Q-waves: not present   Other findings:    Other findings: LAE   (including critical care time)  UC Diagnoses / Final Clinical Impressions(s)   I have reviewed the triage vital signs and the nursing notes.  Pertinent labs & imaging  results that were available during my care of the patient were reviewed by me and considered in my medical decision making (see chart for details).   Final diagnoses:  Weakness  Low grade fever  Diaphoresis  Viral illness  History of coronary artery bypass graft x 2  Bicuspid aortic valve   Patient advised likely viral illness.  Notify patient of blood and viral testing once complete.  EKG is normal.  Conservative care recommended.  ED Prescriptions   None    PDMP not reviewed this encounter.  Pending results:  Labs Reviewed  COVID-19, FLU A+B NAA  CBC WITH DIFFERENTIAL/PLATELET  COMPREHENSIVE METABOLIC PANEL    Medications Ordered in UC: Medications - No data to display  Disposition Upon Discharge:  Condition: stable for discharge home Home: take medications as prescribed; routine discharge instructions as discussed; follow up as advised.  Patient presented with an acute illness with associated systemic symptoms and significant discomfort requiring urgent management. In my opinion, this is a condition that a prudent lay  person (someone who possesses an average knowledge of health and medicine) may potentially expect to result in complications if not addressed urgently such as respiratory distress, impairment of bodily function or dysfunction of bodily organs.   Routine symptom specific, illness specific and/or disease specific instructions were discussed with the patient and/or caregiver at length.   As such, the patient has been evaluated and assessed, work-up was performed and treatment was provided in alignment with urgent care protocols and evidence based medicine.  Patient/parent/caregiver has been advised that the patient may require follow up for further testing and treatment if the symptoms continue in spite of treatment, as clinically indicated and appropriate.  If the patient was tested for COVID-19, Influenza and/or RSV, then the patient/parent/guardian was advised to isolate at home pending the results of his/her diagnostic coronavirus test and potentially longer if they're positive. I have also advised pt that if his/her COVID-19 test returns positive, it's recommended to self-isolate for at least 10 days after symptoms first appeared AND until fever-free for 24 hours without fever reducer AND other symptoms have improved or resolved. Discussed self-isolation recommendations as well as instructions for household member/close contacts as per the [facility] and [place] DHHS, and also gave patient the [place] packet with this information.  Patient/parent/caregiver has been advised to return to the [facility] or PCP in 3-5 days if no better; to PCP or the Emergency Department if new signs and symptoms develop, or if the current signs or symptoms continue to change or worsen for further workup, evaluation and treatment as clinically indicated and appropriate  The patient will follow up with their current PCP if and as advised. If the patient does not currently have a PCP we will assist them in obtaining one.   The patient may need  specialty follow up if the symptoms continue, in spite of conservative treatment and management, for further workup, evaluation, consultation and treatment as clinically indicated and appropriate.  Patient/parent/caregiver verbalized understanding and agreement of plan as discussed.  All questions were addressed during visit.  Please see discharge instructions below for further details of plan.  Discharge Instructions: Discharge Instructions   None     This office note has been dictated using Dragon speech recognition software.  Unfortunately, and despite my best efforts, this method of dictation can sometimes lead to occasional typographical or grammatical errors.  I apologize in advance if this occurs.     Lynden Oxford Scales, PA-C 10/10/21 1115  Lynden Oxford Scales, PA-C 10/10/21 1116

## 2021-10-11 LAB — CBC WITH DIFFERENTIAL/PLATELET
Basophils Absolute: 0.1 10*3/uL (ref 0.0–0.2)
Basos: 1 %
EOS (ABSOLUTE): 0.1 10*3/uL (ref 0.0–0.4)
Eos: 2 %
Hematocrit: 39.5 % (ref 37.5–51.0)
Hemoglobin: 13.9 g/dL (ref 13.0–17.7)
Immature Grans (Abs): 0 10*3/uL (ref 0.0–0.1)
Immature Granulocytes: 0 %
Lymphocytes Absolute: 0.9 10*3/uL (ref 0.7–3.1)
Lymphs: 12 %
MCH: 33.5 pg — ABNORMAL HIGH (ref 26.6–33.0)
MCHC: 35.2 g/dL (ref 31.5–35.7)
MCV: 95 fL (ref 79–97)
Monocytes Absolute: 1 10*3/uL — ABNORMAL HIGH (ref 0.1–0.9)
Monocytes: 14 %
Neutrophils Absolute: 4.8 10*3/uL (ref 1.4–7.0)
Neutrophils: 71 %
Platelets: 276 10*3/uL (ref 150–450)
RBC: 4.15 x10E6/uL (ref 4.14–5.80)
RDW: 12.1 % (ref 11.6–15.4)
WBC: 6.9 10*3/uL (ref 3.4–10.8)

## 2021-10-11 LAB — COMPREHENSIVE METABOLIC PANEL
ALT: 31 IU/L (ref 0–44)
AST: 31 IU/L (ref 0–40)
Albumin/Globulin Ratio: 1.8 (ref 1.2–2.2)
Albumin: 4.2 g/dL (ref 3.8–4.8)
Alkaline Phosphatase: 169 IU/L — ABNORMAL HIGH (ref 44–121)
BUN/Creatinine Ratio: 13 (ref 10–24)
BUN: 8 mg/dL (ref 8–27)
Bilirubin Total: 0.5 mg/dL (ref 0.0–1.2)
CO2: 21 mmol/L (ref 20–29)
Calcium: 8.8 mg/dL (ref 8.6–10.2)
Chloride: 96 mmol/L (ref 96–106)
Creatinine, Ser: 0.63 mg/dL — ABNORMAL LOW (ref 0.76–1.27)
Globulin, Total: 2.3 g/dL (ref 1.5–4.5)
Glucose: 109 mg/dL — ABNORMAL HIGH (ref 70–99)
Potassium: 4.5 mmol/L (ref 3.5–5.2)
Sodium: 132 mmol/L — ABNORMAL LOW (ref 134–144)
Total Protein: 6.5 g/dL (ref 6.0–8.5)
eGFR: 108 mL/min/{1.73_m2} (ref >59)

## 2021-10-12 LAB — COVID-19, FLU A+B NAA
Influenza A, NAA: NOT DETECTED
Influenza B, NAA: NOT DETECTED
SARS-CoV-2, NAA: NOT DETECTED

## 2021-10-16 ENCOUNTER — Ambulatory Visit: Payer: BC Managed Care – PPO | Admitting: Internal Medicine

## 2021-10-16 ENCOUNTER — Encounter: Payer: Self-pay | Admitting: Internal Medicine

## 2021-10-16 ENCOUNTER — Ambulatory Visit (INDEPENDENT_AMBULATORY_CARE_PROVIDER_SITE_OTHER): Payer: BC Managed Care – PPO

## 2021-10-16 VITALS — BP 120/72 | HR 122 | Temp 100.2°F | Ht 68.0 in | Wt 151.4 lb

## 2021-10-16 DIAGNOSIS — I10 Essential (primary) hypertension: Secondary | ICD-10-CM

## 2021-10-16 DIAGNOSIS — R350 Frequency of micturition: Secondary | ICD-10-CM | POA: Diagnosis not present

## 2021-10-16 DIAGNOSIS — R1013 Epigastric pain: Secondary | ICD-10-CM

## 2021-10-16 DIAGNOSIS — R509 Fever, unspecified: Secondary | ICD-10-CM

## 2021-10-16 DIAGNOSIS — R000 Tachycardia, unspecified: Secondary | ICD-10-CM | POA: Diagnosis not present

## 2021-10-16 DIAGNOSIS — R0602 Shortness of breath: Secondary | ICD-10-CM | POA: Diagnosis not present

## 2021-10-16 DIAGNOSIS — R0609 Other forms of dyspnea: Secondary | ICD-10-CM

## 2021-10-16 LAB — BRAIN NATRIURETIC PEPTIDE: Pro B Natriuretic peptide (BNP): 50 pg/mL (ref 0.0–100.0)

## 2021-10-16 LAB — SEDIMENTATION RATE: Sed Rate: 47 mm/hr — ABNORMAL HIGH (ref 0–20)

## 2021-10-16 LAB — C-REACTIVE PROTEIN: CRP: 10 mg/dL (ref 0.5–20.0)

## 2021-10-16 IMAGING — DX DG CHEST 2V
3 series · 3 of 3 positions shown · non-contrast
Comparison: [DATE]

CLINICAL DATA: Persistent low-grade fever. Shortness of breath and
dyspnea on exertion.

EXAM:
CHEST - 2 VIEW

[chest pa (1 of 2)]
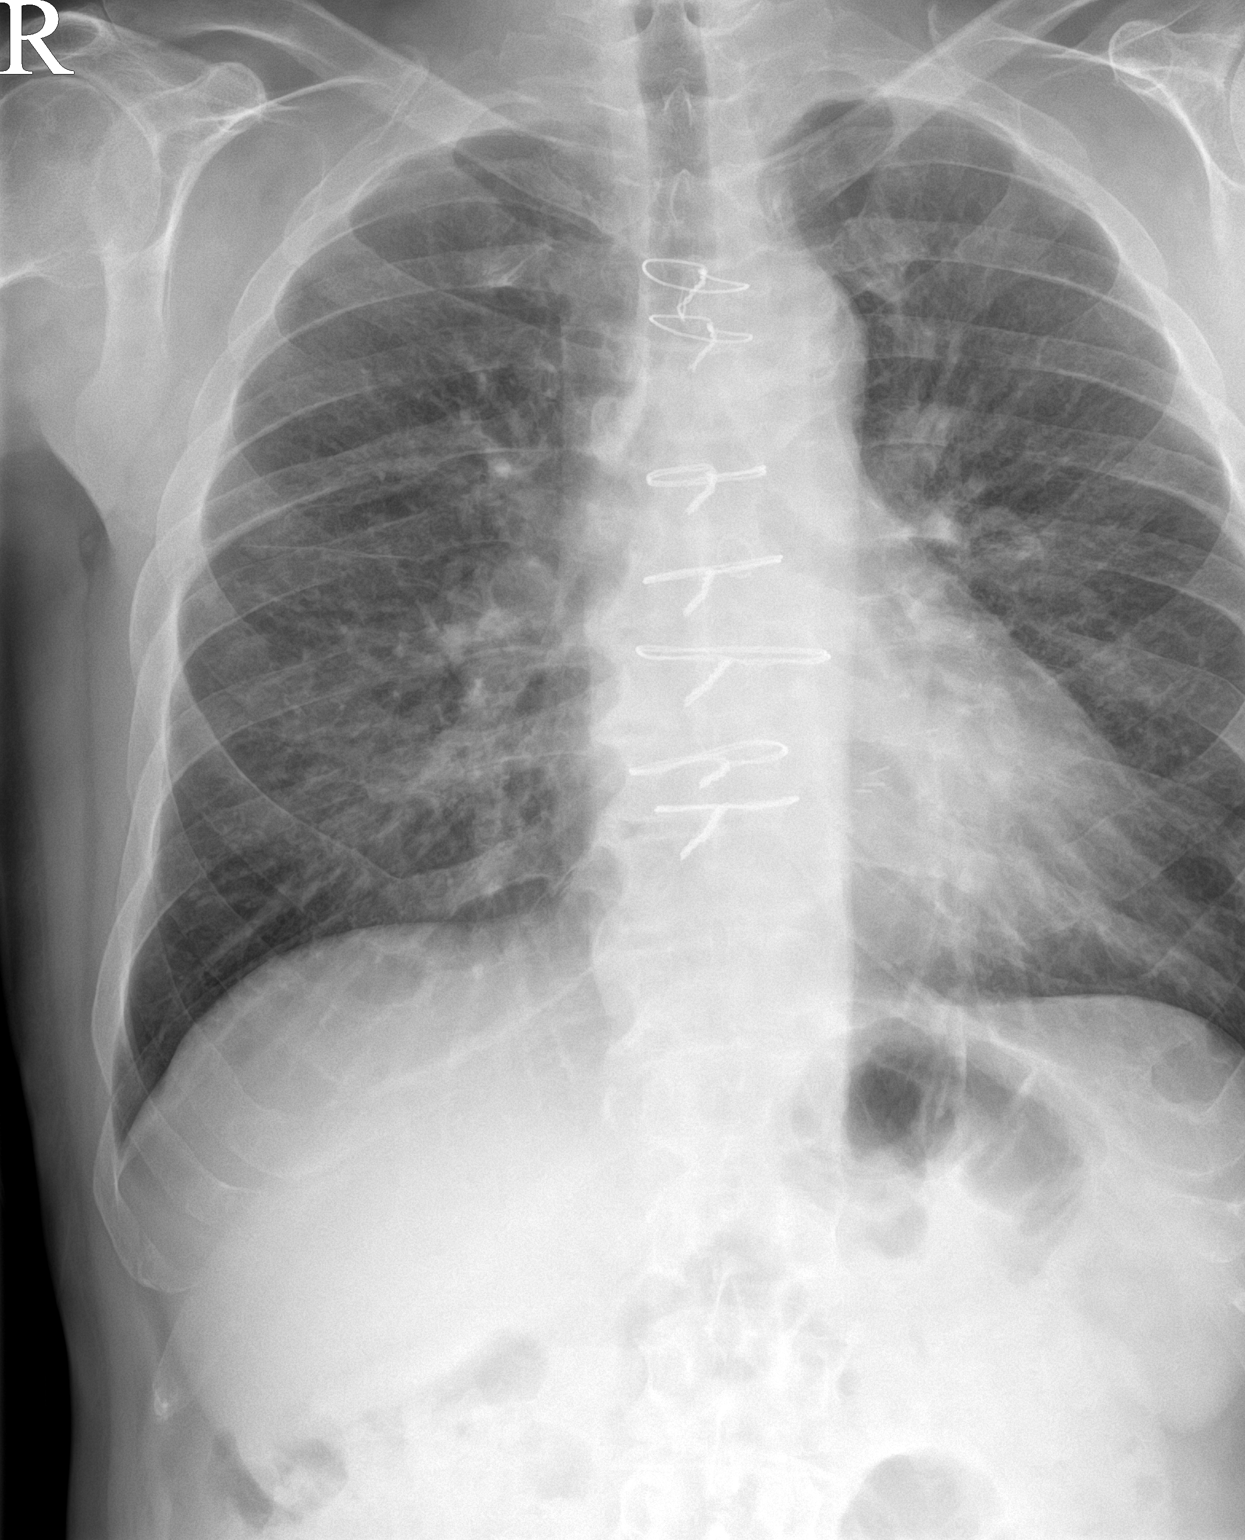

[chest lat]
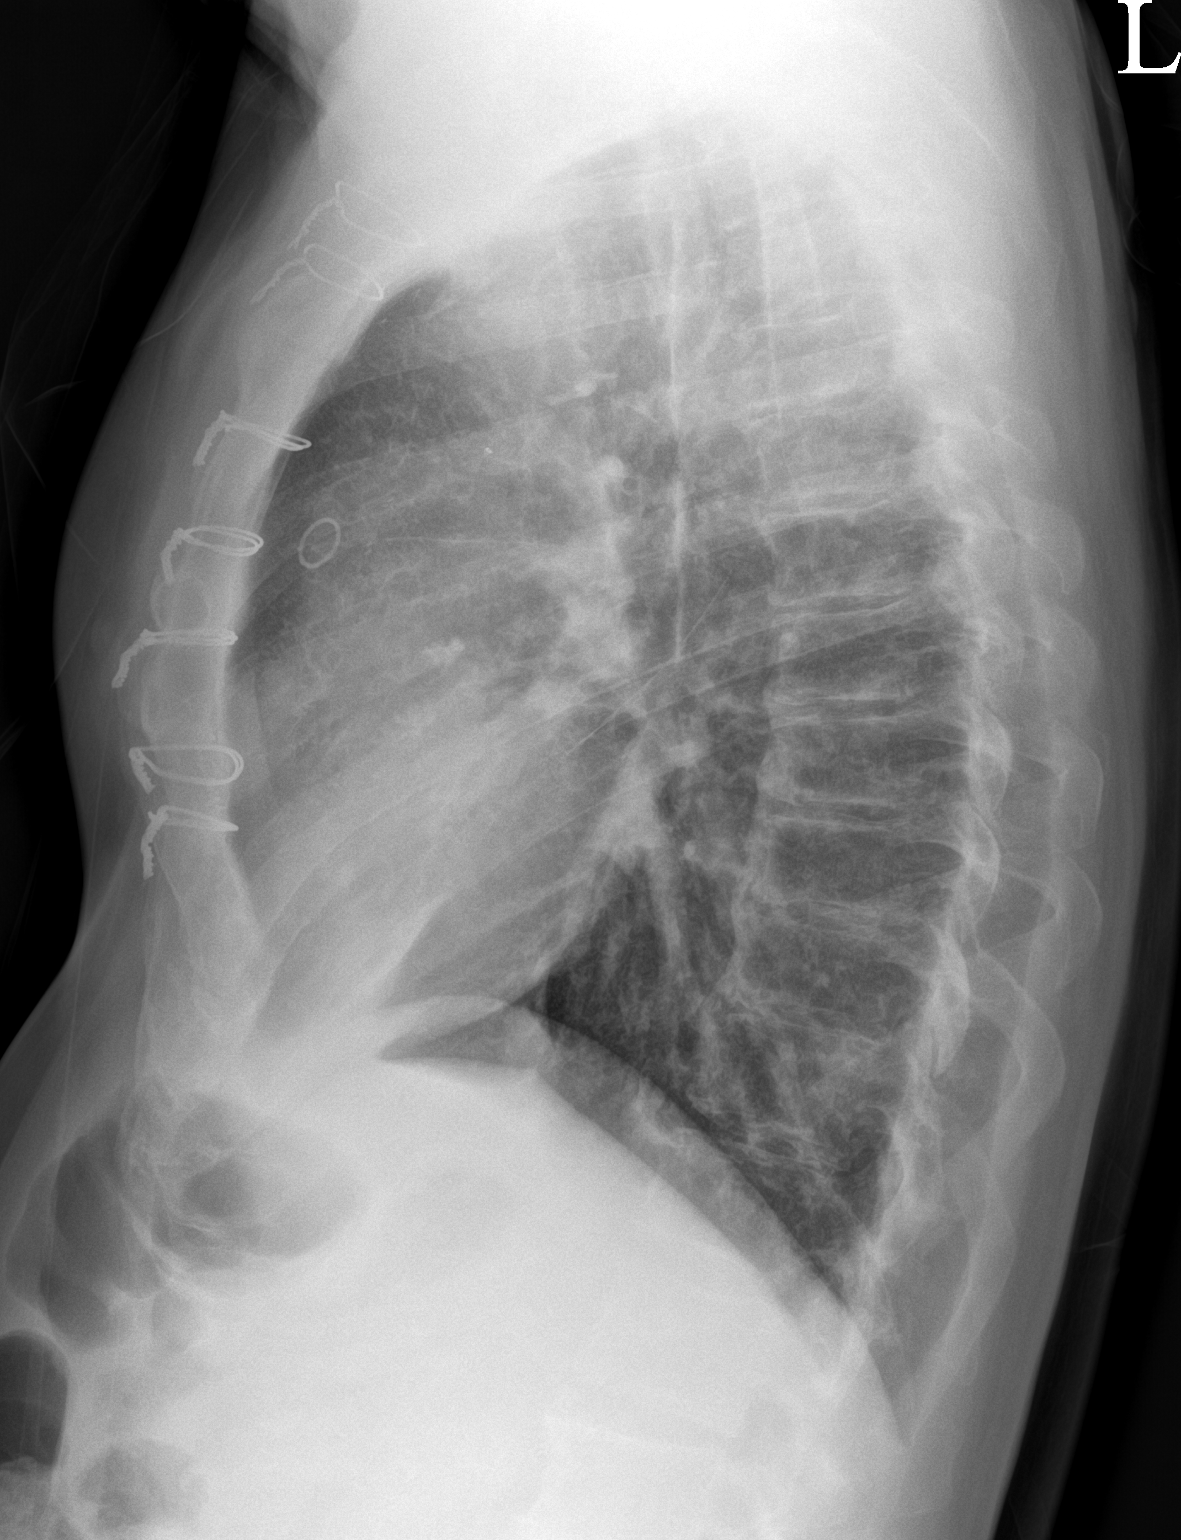

[chest pa (2 of 2)]
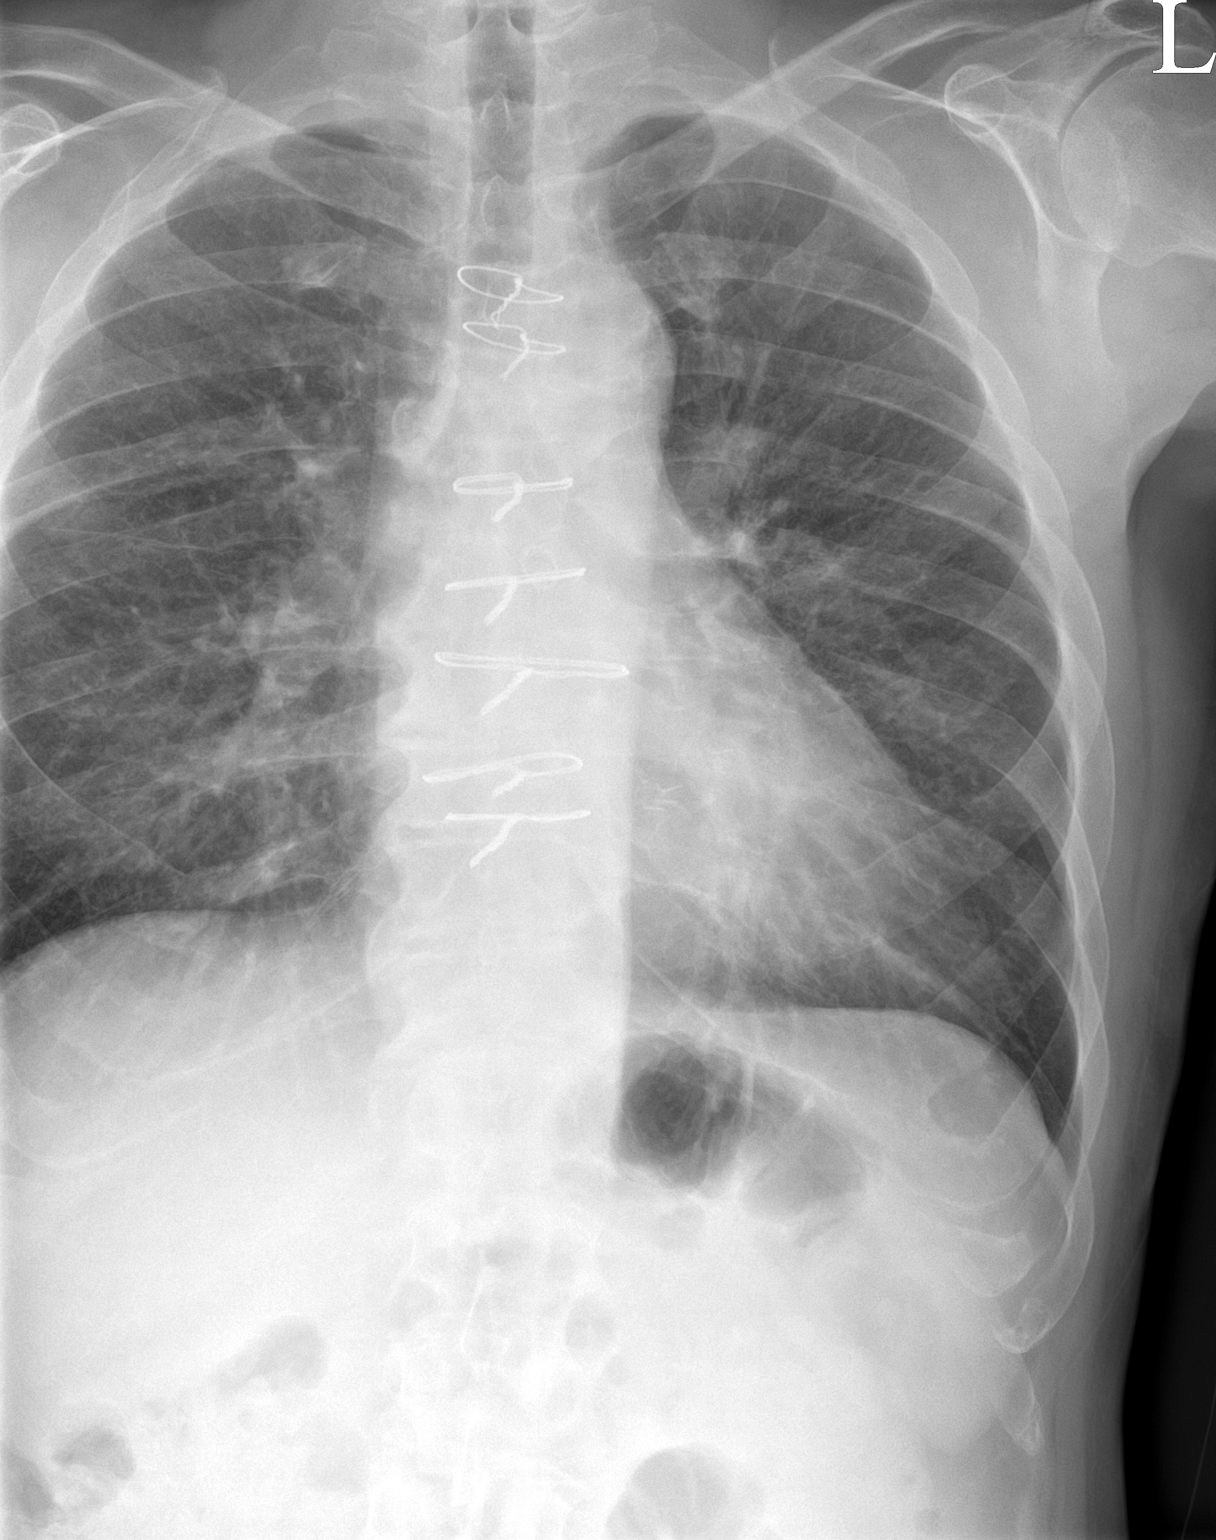

[3 of 3 positions shown; findings below may reference images not displayed]

FINDINGS: No pneumothorax. No nodules or masses. No focal infiltrates. The
cardiomediastinal silhouette is stable.
IMPRESSION: No active cardiopulmonary disease.

## 2021-10-16 MED ORDER — PANTOPRAZOLE SODIUM 40 MG PO TBEC: 40.0000 mg | DELAYED_RELEASE_TABLET | Freq: Every day | ORAL | 3 refills | Status: DC

## 2021-10-16 NOTE — Progress Notes (Unsigned)
Patient ID: EIN RIJO, male   DOB: [birth year], 61 y.o.   MRN: [id number]        Chief Complaint: follow up persistent feeling ill and feverish for 10 days for unclear reason       HPI:  Philip Winters is a 61 y.o. male here with c/o symptoms low grade temp from 99 - 100.2 for approx 10 days with HA, general weakness and malaise, increased HR over baseline 110 now 120s to even 139, assoc with mild sob and doe but Pt denies chest pain, wheezing, orthopnea, PND, increased LE swelling, palpitations, dizziness or syncope.  Denies ear pain, reduced hearing, sinus or other congestion, cough or ST.  Incidentally had yeraly LDCT just prior neg for acute may 18.  Also incidentally has persisent epigastric pain worse to drink cold water, sharp, fleeting, without radiation, n/v, other abd pain or diarrhea or chills.  Does also have recent onset urinary symptoms it seems of possible mild weaker urinary stream and sense of incomplete urination, wife concerned about possible UTI.  No other new complaints.  At least mod nervous today, wife supportive and concerned       Wt Readings from Last 3 Encounters:  10/16/21 151 lb 6.4 oz (68.7 kg)  09/04/21 150 lb (68 kg)  07/31/21 153 lb (69.4 kg)   BP Readings from Last 3 Encounters:  10/16/21 120/72  10/10/21 112/67  09/27/21 (!) 146/85         Past Medical History:  Diagnosis Date   Allergy    Arthritis    CAD (coronary artery disease)    Coronary artery disease    GERD (gastroesophageal reflux disease)    occ   Heart murmur    baby   High cholesterol    Hypertension    Peripheral neuropathy    Pneumonia    Right carotid bruit 06/10/2017   Sciatica    Seasonal allergies    Sinus tachycardia    Spinal stenosis    Past Surgical History:  Procedure Laterality Date   CARDIAC CATHETERIZATION     CARPAL TUNNEL WITH CUBITAL TUNNEL Left 11/10/2013   Procedure: LEFT CARPAL TUNNEL RELEASE;  Surgeon: Cammie Sickle, MD;  Location: [place];  Service: Orthopedics;  Laterality: Left;   COLONOSCOPY  2013   at [facility], [place]-normal exam per pt   CORONARY ARTERY BYPASS GRAFT N/A 05/01/2020   Procedure: CORONARY ARTERY BYPASS GRAFTING (CABG) X 2 ON CARDIOPULMONARY BYPASS. LIMA TO LAD, SVG TO DIAG;  Surgeon: Ivin Poot, MD;  Location: [place];  Service: Open Heart Surgery;  Laterality: N/A;   ENDOVEIN HARVEST OF GREATER SAPHENOUS VEIN Right 05/01/2020   Procedure: ENDOVEIN HARVEST OF GREATER SAPHENOUS VEIN;  Surgeon: Ivin Poot, MD;  Location: [place];  Service: Open Heart Surgery;  Laterality: Right;   KNEE ARTHROSCOPY  [id number]   left and right   LUMBAR LAMINECTOMY/DECOMPRESSION MICRODISCECTOMY Left 09/29/2017   Procedure: LEFT LUMBAR TWO - LUMBAR THREELAMINOTOMY/MICRODISCECTOMY;  Surgeon: Jovita Gamma, MD;  Location: [place];  Service: Neurosurgery;  Laterality: Left;  LEFT LUMBAR 2- LUMBAR 3 LAMINOTOMY/MICRODISCECTOMY   NASAL SINUS SURGERY  05/2015   RIGHT/LEFT HEART CATH AND CORONARY ANGIOGRAPHY N/A 04/10/2020   Procedure: RIGHT/LEFT HEART CATH AND CORONARY ANGIOGRAPHY;  Surgeon: Troy Sine, MD;  Location: [place] [facility];  Service: Cardiovascular;  Laterality: N/A;   TEE WITHOUT CARDIOVERSION N/A 05/01/2020   Procedure: TRANSESOPHAGEAL ECHOCARDIOGRAM (TEE);  Surgeon: Prescott Gum, Collier Salina, MD;  [facility];  Service: Open Heart Surgery;  Laterality: N/A;   TOTAL KNEE ARTHROPLASTY Right 06/24/2016   Procedure: TOTAL KNEE ARTHROPLASTY;  Surgeon: Garald Balding, MD;  Location: [place];  Service: Orthopedics;  Laterality: Right;   TOTAL KNEE ARTHROPLASTY Left 12/18/2020   Procedure: LEFT TOTAL KNEE ARTHROPLASTY;  Surgeon: Garald Balding, MD;  [facility];  Service: Orthopedics;  Laterality: Left;   TRIGGER FINGER RELEASE Left 11/10/2013   Procedure: LEFT INDEX A-1 PULLEY RELEASE;  Surgeon: Cammie Sickle, MD;  Location: [place];  Service: Orthopedics;  Laterality: Left;    ULNAR NERVE TRANSPOSITION Left 11/10/2013   Procedure: ULNAR NERVE TRANSPOSITION;  Surgeon: Cammie Sickle, MD;  Location: [place];  Service: Orthopedics;  Laterality: Left;    reports that he quit smoking about 5 years ago. His smoking use included cigarettes. He has a 37.00 pack-year smoking history. He has never used smokeless tobacco. He reports current alcohol use of about 5.0 standard drinks per week. He reports that he does not use drugs. family history includes Cancer in his father; Hypertension in his father and mother; Throat cancer in his mother. Allergies  Allergen Reactions   Mushroom Extract Complex Anaphylaxis   Penicillins Shortness Of Breath    Childhood allergy.  Has patient had a PCN reaction causing immediate rash, facial/tongue/throat swelling, SOB or lightheadedness with hypotension: No Has patient had a PCN reaction causing severe rash involving mucus membranes or skin necrosis: No Has patient had a PCN reaction that required hospitalization No Has patient had a PCN reaction occurring within the last 10 years: No If all of the above answers are "NO", then may proceed with C   Atorvastatin Other (See Comments)    myalgia   Dilaudid [Hydromorphone] Nausea And Vomiting   Losartan Potassium Other (See Comments)    Lethargic    Omega-3-Acid Ethyl Esters     Other reaction(s): myalgia   Oxycodone Hcl     Other reaction(s): hallucinations   Pravastatin Other (See Comments)    myalgia   Red Dye     Other reaction(s): rash   Rosuvastatin Other (See Comments)    myalgia   Varenicline     Other reaction(s): headaches Other reaction(s): headaches   Bystolic [Nebivolol Hcl] Other (See Comments)    Extreme fatigue, not able to focus   Codeine Nausea And Vomiting   Iodine Rash   Iohexol Rash   Metoprolol Other (See Comments)    Made patient very fatigued, could not focus   Repatha [Evolocumab] Rash and Other (See Comments)    Hip pain and rash    Current Outpatient Medications on File Prior to Visit  Medication Sig Dispense Refill   acetaminophen (TYLENOL) 325 MG tablet Take 2 tablets (650 mg total) by mouth every 6 (six) hours as needed for mild pain or moderate pain (pain score 1-3 or temp > 100.5). 100 tablet 0   Alirocumab (PRALUENT) 75 MG/ML SOAJ Inject 1 pen into the skin every 14 (fourteen) days. 2 mL 11   aspirin EC 81 MG tablet Take 1 tablet (81 mg total) by mouth 2 (two) times daily after a meal. Swallow whole. 90 tablet 3   clobetasol cream (TEMOVATE) 8.24 % Apply 1 application. topically 2 (two) times daily.     diltiazem (CARDIZEM CD) 240 MG 24 hr capsule Take 240 mg by mouth daily.     diphenhydrAMINE (BENADRYL) 25 MG tablet Take 12.5 mg  by mouth at bedtime.     fluticasone (FLONASE) 50 MCG/ACT nasal spray Place 2 sprays into both nostrils daily as needed for allergies.      Multiple Vitamin (MULTIVITAMIN WITH MINERALS) TABS tablet Take 1 tablet by mouth in the morning.     olmesartan (BENICAR) 20 MG tablet Take 1 tablet (20 mg total) by mouth daily. 90 tablet 1   Polyethyl Glycol-Propyl Glycol (LUBRICANT EYE DROPS) 0.4-0.3 % SOLN Place 1 drop into both eyes 3 (three) times daily as needed (dry/irritated eyes).     tiZANidine (ZANAFLEX) 4 MG tablet Take 1 tablet (4 mg total) by mouth at bedtime. 30 tablet 0   No current facility-administered medications on file prior to visit.        ROS:  All others reviewed and negative.  Objective        PE:  BP 120/72 (BP Location: Right Arm, Patient Position: Sitting, Cuff Size: Large)   Pulse (!) 122   Temp 100.2 F (37.9 C) (Oral)   Ht '5\' 8"'$  (1.727 m)   Wt 151 lb 6.4 oz (68.7 kg)   SpO2 96%   BMI 23.02 kg/m                 Constitutional: Pt appears in NAD               HENT: Head: NCAT.                Right Ear: External ear normal.                 Left Ear: External ear normal.                Eyes: . Pupils are equal, round, and reactive to light. Conjunctivae and  EOM are normal               Nose: without d/c or deformity               Neck: Neck supple. Gross normal ROM               Cardiovascular: Normal rate and regular rhythm.                 Pulmonary/Chest: Effort normal and breath sounds without rales or wheezing.                Abd:  Soft, mild epigstric tender, ND, + BS, no organomegaly, no flank tender               Neurological: Pt is alert. At baseline orientation, motor grossly intact               Skin: Skin is warm. No rashes, no other new lesions, LE edema - none               Psychiatric: Pt behavior is normal without agitation   Micro: none  Cardiac tracings I have personally interpreted today:  none  Pertinent Radiological findings (summarize): none   Lab Results  Component Value Date   WBC 6.9 10/10/2021   HGB 13.9 10/10/2021   HCT 39.5 10/10/2021   PLT 276 10/10/2021   GLUCOSE 109 (H) 10/10/2021   CHOL 122 07/22/2021   TRIG 98 07/22/2021   HDL 60 07/22/2021   LDLCALC 44 07/22/2021   ALT 31 10/10/2021   AST 31 10/10/2021   NA 132 (L) 10/10/2021   K 4.5 10/10/2021   CL 96 10/10/2021   CREATININE 0.63 (  L) 10/10/2021   BUN 8 10/10/2021   CO2 21 10/10/2021   TSH 1.430 04/17/2021   INR 1.2 05/03/2020   HGBA1C 5.7 (H) 04/17/2021   Assessment/Plan:  REMBERT BROWE is a 61 y.o. White or Caucasian [1] male with  has a past medical history of Allergy, Arthritis, CAD (coronary artery disease), Coronary artery disease, GERD (gastroesophageal reflux disease), Heart murmur, High cholesterol, Hypertension, Peripheral neuropathy, Pneumonia, Right carotid bruit (06/10/2017), Sciatica, Seasonal allergies, Sinus tachycardia, and Spinal stenosis.  DOE (dyspnea on exertion) Etiology unclear, exam benign, recent LDCT neg, for CXR today, d dimer, bnp and  to f/u any worsening symptoms or concerns, consider pulm referral  Low grade fever Etiology unclear, exam benign, no chills, non toxic appearing, recent wbc not elevated, ? Viral,  cont to follow with expectant management  Urinary frequency ? Incidental I suspect, but will need ua and culture,  to f/u any worsening symptoms or concerns  Primary hypertension BP Readings from Last 3 Encounters:  10/16/21 120/72  10/10/21 112/67  09/27/21 (!) 146/85   Stable, pt to continue medical treatment card CD 240 benicar 20   Chronic tachycardia Mild worsening over baseline, declines ecg for now, regular, likely hyperdynamic,  to f/u any worsening symptoms or concerns  Epigastric pain Incidental likely, suspect underlying gastritis, for trial protonix 40, consider CT for persistent or worsening s/s , for lab today including lft's  Followup: No follow-ups on file.  Cathlean Cower, MD 10/17/2021 8:24 PM [facility]

## 2021-10-16 NOTE — Patient Instructions (Signed)
Please continue all other medications as before, and refills have been done if requested.  Please have the pharmacy call with any other refills you may need.  Please continue your efforts at being more active, low cholesterol diet, and weight control.  You are otherwise up to date with prevention measures today.  Please keep your appointments with your specialists as you may have planned - urology tomorrow  Please go to the XRAY Department in the first floor for the x-ray testing  Please go to the LAB at the blood drawing area for the tests to be done  You will be contacted by phone if any changes need to be made immediately.  Otherwise, you will receive a letter about your results with an explanation, but please check with MyChart first.  Please remember to sign up for MyChart if you have not done so, as this will be important to you in the future with finding out test results, communicating by private email, and scheduling acute appointments online when needed.  If the tests are ok but still having SOB we should consider pulmonary referral

## 2021-10-17 ENCOUNTER — Other Ambulatory Visit: Payer: Self-pay | Admitting: Internal Medicine

## 2021-10-17 DIAGNOSIS — R509 Fever, unspecified: Secondary | ICD-10-CM | POA: Insufficient documentation

## 2021-10-17 DIAGNOSIS — R350 Frequency of micturition: Secondary | ICD-10-CM | POA: Insufficient documentation

## 2021-10-17 DIAGNOSIS — N486 Induration penis plastica: Secondary | ICD-10-CM | POA: Diagnosis not present

## 2021-10-17 DIAGNOSIS — R079 Chest pain, unspecified: Secondary | ICD-10-CM

## 2021-10-17 DIAGNOSIS — R3912 Poor urinary stream: Secondary | ICD-10-CM | POA: Diagnosis not present

## 2021-10-17 DIAGNOSIS — N401 Enlarged prostate with lower urinary tract symptoms: Secondary | ICD-10-CM | POA: Diagnosis not present

## 2021-10-17 DIAGNOSIS — R1013 Epigastric pain: Secondary | ICD-10-CM | POA: Insufficient documentation

## 2021-10-17 LAB — PSA: PSA: 1.5

## 2021-10-17 LAB — URINALYSIS, ROUTINE W REFLEX MICROSCOPIC
Bilirubin Urine: NEGATIVE
Hgb urine dipstick: NEGATIVE
Leukocytes,Ua: NEGATIVE
Nitrite: NEGATIVE
RBC / HPF: NONE SEEN (ref 0–?)
Specific Gravity, Urine: 1.01 (ref 1.000–1.030)
Total Protein, Urine: NEGATIVE
Urine Glucose: NEGATIVE
Urobilinogen, UA: 1 (ref 0.0–1.0)
pH: 7.5 (ref 5.0–8.0)

## 2021-10-17 LAB — URINE CULTURE: Result:: NO GROWTH

## 2021-10-17 LAB — D-DIMER, QUANTITATIVE: D-Dimer, Quant: 0.69 mcg/mL FEU — ABNORMAL HIGH (ref <0.50)

## 2021-10-17 NOTE — Assessment & Plan Note (Addendum)
Etiology unclear, exam benign, no chills, non toxic appearing, recent wbc not elevated, ? Viral, cont to follow with expectant management

## 2021-10-17 NOTE — Assessment & Plan Note (Signed)
Mild worsening over baseline, declines ecg for now, regular, likely hyperdynamic,  to f/u any worsening symptoms or concerns

## 2021-10-17 NOTE — Assessment & Plan Note (Addendum)
Etiology unclear, exam benign, recent LDCT neg, for CXR today, d dimer, bnp and  to f/u any worsening symptoms or concerns, consider pulm referral

## 2021-10-17 NOTE — Assessment & Plan Note (Signed)
?   Incidental I suspect, but will need ua and culture,  to f/u any worsening symptoms or concerns

## 2021-10-17 NOTE — Assessment & Plan Note (Signed)
BP Readings from Last 3 Encounters:  10/16/21 120/72  10/10/21 112/67  09/27/21 (!) 146/85   Stable, pt to continue medical treatment card CD 240 benicar 20

## 2021-10-17 NOTE — Assessment & Plan Note (Addendum)
Incidental likely, suspect underlying gastritis, for trial protonix 40, consider CT for persistent or worsening s/s , for lab today including lft's

## 2021-10-18 ENCOUNTER — Encounter: Payer: Self-pay | Admitting: Internal Medicine

## 2021-11-01 ENCOUNTER — Inpatient Hospital Stay: Admission: RE | Admit: 2021-11-01 | Payer: BC Managed Care – PPO | Source: Ambulatory Visit

## 2021-11-06 ENCOUNTER — Ambulatory Visit (INDEPENDENT_AMBULATORY_CARE_PROVIDER_SITE_OTHER): Payer: BC Managed Care – PPO

## 2021-11-06 ENCOUNTER — Ambulatory Visit: Payer: BC Managed Care – PPO | Admitting: Orthopaedic Surgery

## 2021-11-06 ENCOUNTER — Encounter: Payer: Self-pay | Admitting: Orthopaedic Surgery

## 2021-11-06 DIAGNOSIS — M170 Bilateral primary osteoarthritis of knee: Secondary | ICD-10-CM | POA: Diagnosis not present

## 2021-11-06 DIAGNOSIS — M65342 Trigger finger, left ring finger: Secondary | ICD-10-CM | POA: Diagnosis not present

## 2021-11-06 DIAGNOSIS — L570 Actinic keratosis: Secondary | ICD-10-CM | POA: Diagnosis not present

## 2021-11-06 DIAGNOSIS — Z96652 Presence of left artificial knee joint: Secondary | ICD-10-CM

## 2021-11-06 MED ORDER — LIDOCAINE HCL 1 % IJ SOLN
1.0000 mL | INTRAMUSCULAR | Status: AC | PRN
  Administered 2021-11-06: 1 mL

## 2021-11-06 MED ORDER — METHYLPREDNISOLONE ACETATE 40 MG/ML IJ SUSP
10.0000 mg | INTRAMUSCULAR | Status: AC | PRN
  Administered 2021-11-06: 10 mg

## 2021-11-06 NOTE — Progress Notes (Signed)
Office Visit Note   Patient: Philip Winters           Date of Birth: [birth year]           MRN: [id number] Visit Date: 11/06/2021              Requested by: Janith Lima, MD [address] [employer],  [place] [address] PCP: Janith Lima, MD   Assessment & Plan: Visit Diagnoses:  1. Total knee replacement status, left   2. Bilateral primary osteoarthritis of knee   3. Trigger finger, left ring finger     Plan: Mr. Summer has had bilateral total knee replacements and has done well until several days ago when he began to experience some discomfort in his left knee.  Surgery was performed almost a year ago.  He has been working out at [employer] for lower extremity strengthening and denies any history of injury or trauma.  He has a bit of discomfort along the pes anserine bursa but his knee was not hot red or swollen and there was no effusion.  No evidence of instability.  X-rays reveal excellent position of the components with a nice glue mantle.  I suspect he just has a soft tissue problem and possibly related to the weather and from recently working out at the gym.  I have suggested CBD oil as he is allergic to Voltaren gel and time.  I think he is going to be just fine.  Also having recurrent triggering of the left ring finger and will inject that with cortisone  Follow-Up Instructions: Return if symptoms worsen or fail to improve.   Orders:  Orders Placed This Encounter  Procedures   XR KNEE 3 VIEW LEFT   No orders of the defined types were placed in this encounter.     Procedures: Hand/UE Inj for trigger finger on 11/06/2021 10:28 AM Details: 27 G needle, volar approach Medications: 1 mL lidocaine 1 %; 10 mg methylPREDNISolone acetate 40 MG/ML      Clinical Data: No additional findings.   Subjective: Chief Complaint  Patient presents with   Left Knee - Follow-up  Awoke 2 days ago with left knee pain.  Had total knee replacement in August 2022.  Denies fever or chills  or injury.  Has been working out at [employer].  Pain is along the proximal tibia without any skin changes.  Also relates having recurrent symptoms of triggering of the left ring finger  HPI  Review of Systems   Objective: Vital Signs: There were no vitals taken for this visit.  Physical Exam Constitutional:      Appearance: He is well-developed.  Pulmonary:     Effort: Pulmonary effort is normal.  Skin:    General: Skin is warm and dry.  Neurological:     Mental Status: He is alert and oriented to person, place, and time.  Psychiatric:        Behavior: Behavior normal.     Ortho Exam awake alert and oriented x3.  Comfortable sitting.  Right knee was not hot red warm or swollen.  No effusion.  Full extension of flexed over 105 degrees.  No opening with varus and valgus stress.  Little bit of tenderness over the pes anserine bursa.  No patella pain or pain laterally.  No calf pain or popliteal discomfort.  Recurrent triggering of the left ring finger.  Neurologically intact.  Painful nodule on the palmar aspect at the level of the  metacarpal phalangeal joint  Specialty Comments:  No specialty comments available.  Imaging: XR KNEE 3 VIEW LEFT  Result Date: 11/06/2021 Films of the left total knee replacement obtained in 3 projections.  Glue mantle is excellent.  No obvious complications.  Alignment is neutral.  There is a little bit of ectopic calcification at the superior pole of the patella but patient is asymptomatic in that area.  No obvious problems per films    PMFS History: Patient Active Problem List   Diagnosis Date Noted   Trigger finger, left ring finger 11/06/2021   Low grade fever 10/17/2021   Urinary frequency 10/17/2021   Epigastric pain 10/17/2021   DOE (dyspnea on exertion) 09/04/2021   Cervical radiculopathy 08/13/2021   Neck pain 08/13/2021   Ulnar neuropathy 08/13/2021   Weakness of both upper extremities 08/13/2021   Screen for colon cancer 06/06/2021    Chronic tachycardia 06/04/2021   Candidal balanitis 06/04/2021   Carpal tunnel syndrome, bilateral upper limbs 09/26/2020   Cubital tunnel syndrome on left 08/23/2020   Pulmonary nodule 1 cm or greater in diameter 04/26/2020   Statin intolerance 04/20/2020   Coronary atherosclerosis of native coronary artery 04/18/2020   Nonrheumatic aortic valve stenosis    Bicuspid aortic valve 04/06/2020   Primary osteoarthritis of left knee 11/08/2019   Bilateral primary osteoarthritis of knee 09/28/2019   HNP (herniated nucleus pulposus), lumbar 09/29/2017   Weakness of left lower extremity 09/18/2017   Backache 09/08/2017   Unilateral primary osteoarthritis, right knee 06/24/2016   Primary hypertension 01/18/2007   Past Medical History:  Diagnosis Date   Allergy    Arthritis    CAD (coronary artery disease)    Coronary artery disease    GERD (gastroesophageal reflux disease)    occ   Heart murmur    baby   High cholesterol    Hypertension    Peripheral neuropathy    Pneumonia    Right carotid bruit 06/10/2017   Sciatica    Seasonal allergies    Sinus tachycardia    Spinal stenosis     Family History  Problem Relation Age of Onset   Hypertension Mother    Throat cancer Mother    Hypertension Father    Cancer Father    Colon cancer Neg Hx    Esophageal cancer Neg Hx    Stomach cancer Neg Hx    Rectal cancer Neg Hx    Colon polyps Neg Hx     Past Surgical History:  Procedure Laterality Date   CARDIAC CATHETERIZATION     CARPAL TUNNEL WITH CUBITAL TUNNEL Left 11/10/2013   Procedure: LEFT CARPAL TUNNEL RELEASE;  Surgeon: Cammie Sickle, MD;  Location: [place];  Service: Orthopedics;  Laterality: Left;   COLONOSCOPY  2013   at [facility] [place]-normal exam per pt   CORONARY ARTERY BYPASS GRAFT N/A 05/01/2020   Procedure: CORONARY ARTERY BYPASS GRAFTING (CABG) X 2 ON CARDIOPULMONARY BYPASS. LIMA TO LAD, SVG TO DIAG;  Surgeon: Ivin Poot, MD;   Location: [place];  Service: Open Heart Surgery;  Laterality: N/A;   ENDOVEIN HARVEST OF GREATER SAPHENOUS VEIN Right 05/01/2020   Procedure: ENDOVEIN HARVEST OF GREATER SAPHENOUS VEIN;  Surgeon: Ivin Poot, MD;  Location: [place];  Service: Open Heart Surgery;  Laterality: Right;   KNEE ARTHROSCOPY  [id number]   left and right   LUMBAR LAMINECTOMY/DECOMPRESSION MICRODISCECTOMY Left 09/29/2017   Procedure: LEFT LUMBAR TWO - LUMBAR THREELAMINOTOMY/MICRODISCECTOMY;  Surgeon: Jovita Gamma,  MD;  Location: [place];  Service: Neurosurgery;  Laterality: Left;  LEFT LUMBAR 2- LUMBAR 3 LAMINOTOMY/MICRODISCECTOMY   NASAL SINUS SURGERY  05/2015   RIGHT/LEFT HEART CATH AND CORONARY ANGIOGRAPHY N/A 04/10/2020   Procedure: RIGHT/LEFT HEART CATH AND CORONARY ANGIOGRAPHY;  Surgeon: Troy Sine, MD;  Location: [place] [facility];  Service: Cardiovascular;  Laterality: N/A;   TEE WITHOUT CARDIOVERSION N/A 05/01/2020   Procedure: TRANSESOPHAGEAL ECHOCARDIOGRAM (TEE);  Surgeon: Prescott Gum, Collier Salina, MD;  Location: [place];  Service: Open Heart Surgery;  Laterality: N/A;   TOTAL KNEE ARTHROPLASTY Right 06/24/2016   Procedure: TOTAL KNEE ARTHROPLASTY;  Surgeon: Garald Balding, MD;  Location: [place];  Service: Orthopedics;  Laterality: Right;   TOTAL KNEE ARTHROPLASTY Left 12/18/2020   Procedure: LEFT TOTAL KNEE ARTHROPLASTY;  Surgeon: Garald Balding, MD;  [facility];  Service: Orthopedics;  Laterality: Left;   TRIGGER FINGER RELEASE Left 11/10/2013   Procedure: LEFT INDEX A-1 PULLEY RELEASE;  Surgeon: Cammie Sickle, MD;  Location: [place];  Service: Orthopedics;  Laterality: Left;   ULNAR NERVE TRANSPOSITION Left 11/10/2013   Procedure: ULNAR NERVE TRANSPOSITION;  Surgeon: Cammie Sickle, MD;  Location: [place];  Service: Orthopedics;  Laterality: Left;   Social History   Occupational History   Occupation: Self employed    Comment: plumber  Tobacco Use    Smoking status: Former    Packs/day: 1.00    Years: 37.00    Total pack years: 37.00    Types: Cigarettes    Quit date: 05/04/2016    Years since quitting: 5.5   Smokeless tobacco: Never  Vaping Use   Vaping Use: Former  Substance and Sexual Activity   Alcohol use: Yes    Alcohol/week: 5.0 standard drinks of alcohol    Types: 5 Glasses of wine per week    Comment: weekends only with dinner   Drug use: No   Sexual activity: Not on file     Garald Balding, MD   Note - This record has been created using [employer].  Chart creation errors have been sought, but may not always  have been located. Such creation errors do not reflect on  the standard of medical care.

## 2021-11-11 ENCOUNTER — Telehealth: Payer: Self-pay | Admitting: Cardiology

## 2021-11-11 MED ORDER — PRALUENT 75 MG/ML ~~LOC~~ SOAJ
1.0000 "pen " | SUBCUTANEOUS | 11 refills | Status: DC
  Filled 2021-12-16: qty 2, 28d supply, fill #0
  Filled 2022-01-10 – 2022-01-13 (×2): qty 2, 28d supply, fill #1
  Filled 2022-03-12: qty 2, 28d supply, fill #2
  Filled 2022-04-03 – 2022-04-09 (×2): qty 2, 28d supply, fill #3

## 2021-11-18 ENCOUNTER — Encounter: Payer: Self-pay | Admitting: Internal Medicine

## 2021-11-29 ENCOUNTER — Ambulatory Visit: Payer: BC Managed Care – PPO | Admitting: Cardiology

## 2021-11-29 ENCOUNTER — Encounter: Payer: Self-pay | Admitting: Cardiology

## 2021-11-29 DIAGNOSIS — I2510 Atherosclerotic heart disease of native coronary artery without angina pectoris: Secondary | ICD-10-CM

## 2021-11-29 DIAGNOSIS — Q231 Congenital insufficiency of aortic valve: Secondary | ICD-10-CM | POA: Diagnosis not present

## 2021-11-29 DIAGNOSIS — I10 Essential (primary) hypertension: Secondary | ICD-10-CM

## 2021-11-29 DIAGNOSIS — M5126 Other intervertebral disc displacement, lumbar region: Secondary | ICD-10-CM

## 2021-11-29 DIAGNOSIS — R000 Tachycardia, unspecified: Secondary | ICD-10-CM | POA: Diagnosis not present

## 2021-11-29 NOTE — Assessment & Plan Note (Signed)
Mild.  We will continue to monitor.  Aortic root 41 mm.  Continue to monitor.

## 2021-11-29 NOTE — Patient Instructions (Signed)
Medication Instructions:  The current medical regimen is effective;  continue present plan and medications.  *If you need a refill on your cardiac medications before your next appointment, please call your pharmacy*  Follow-Up: At [facility], you and your health needs are our priority.  As part of our continuing mission to provide you with exceptional heart care, we have created designated Provider Care Teams.  These Care Teams include your primary Cardiologist (physician) and Advanced Practice Providers (APPs -  Physician Assistants and Nurse Practitioners) who all work together to provide you with the care you need, when you need it.  We recommend signing up for the patient portal called "MyChart".  Sign up information is provided on this After Visit Summary.  MyChart is used to connect with patients for Virtual Visits (Telemedicine).  Patients are able to view lab/test results, encounter notes, upcoming appointments, etc.  Non-urgent messages can be sent to your provider as well.   To learn more about what you can do with MyChart, go to [email or web address].    Your next appointment:   6 month(s)  The format for your next appointment:   In Person  Provider:   Candee Furbish, MD {   Important Information About Sugar

## 2021-11-29 NOTE — Assessment & Plan Note (Signed)
Continue with diltiazem.

## 2021-11-29 NOTE — Assessment & Plan Note (Signed)
Post CABG LIMA to LAD SVG to diagonal 2021 Dr. Darcey Nora, doing well.  No adverse symptoms no angina.  Continue with aggressive goal-directed medical therapy.  Aspirin 81 mg.  He does have some bruising on his forearms.  Continue with diltiazem 240.  Heart rate slightly elevated today.  Normal EF.  Praluent.

## 2021-11-29 NOTE — Assessment & Plan Note (Signed)
Working on his core at [employer].  Has upcoming back surgery for lumbar stenosis.  Has numbness in his feet now.

## 2021-11-29 NOTE — Progress Notes (Signed)
Cardiology Office Note:    Date:  11/29/2021   ID:  Philip Winters, DOB 1960/06/25, MRN [id number]  PCP:  Janith Lima, MD   [facility] Providers Cardiologist:  Candee Furbish, MD     Referring MD: Janith Lima, MD    History of Present Illness:    Philip Winters is a 61 y.o. male here for follow-up of CAD post CABG, hypertension, and bicuspid aortic valve.    He presented to urgent care on 10/10/21 with symptoms concerning for viral illness. It was recommended to follow conservative care.  He called the office 06/06/21 reporting that his PCP started him on Olmesartan 20 mg daily the day before. He was concerned about adverse reactions as he was already taking Diltiazem 240 mg daily. It was noted that diltiazem had been removed from his med list 04/17/21 as he reported not taking it. It was re-added to his list as he never discontinued diltiazem.  CABG x2 LIMA to LAD (90% proximal LAD) and SVG to diagonal on 05/01/2020 by Dr. Darcey Nora.  High risk nuclear stress test.   Had extensive anginal symptoms prior.  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   Has bicuspid aortic valve, 4.1 cm aortic root.  1.5 cm infiltrative density in the superior segment of the left upper lobe which will need to be followed. Did not require replacement at time of CABG.   At his last appointment he reported interesting bilateral hand swelling left greater than right, puffiness that lasted about 2 days.  This subsided.  Dr. Moreen Fowler was able to check some blood work and evaluate. He had also stopped his Repatha due to hip pain. He was asked to reach out to our lipid clinic. His left knee was being drained frequently, needed replacement. Agreed that it was reasonable to proceed as he was close to 6 months post CABG. Wearing his Popeye shirt.  Today: He reports that he was feeling ill a few months ago and presented to urgent care. His blood pressure was low, and he was told to cut  his Benicar in half. He continues to take this 20 mg dose and his blood pressure has been controlled at home.  He states he was supposed to have lumbar fusion in 09/2021; he endorses gradual worsening of numbness in his bilateral feet. He wanted to work on improving his health and exercise first. For exercise he routinely goes to the [employer]. He is also walking daily.  He remains compliant with Praluent; still tolerates it well.   His recent labs show LDL 44.  He denies any palpitations, chest pain, shortness of breath, or peripheral edema. No lightheadedness, headaches, syncope, orthopnea, or PND.    Past Medical History:  Diagnosis Date   Allergy    Arthritis    CAD (coronary artery disease)    Coronary artery disease    GERD (gastroesophageal reflux disease)    occ   Heart murmur    baby   High cholesterol    Hypertension    Peripheral neuropathy    Pneumonia    Right carotid bruit 06/10/2017   Sciatica    Seasonal allergies    Sinus tachycardia    Spinal stenosis     Past Surgical History:  Procedure Laterality Date   CARDIAC CATHETERIZATION     CARPAL TUNNEL WITH CUBITAL TUNNEL Left 11/10/2013   Procedure: LEFT CARPAL TUNNEL RELEASE;  Surgeon: Cammie Sickle, MD;  Location: [facility]  [place];  Service: Orthopedics;  Laterality: Left;   COLONOSCOPY  2013   at [facility] [place]-normal exam per pt   CORONARY ARTERY BYPASS GRAFT N/A 05/01/2020   Procedure: CORONARY ARTERY BYPASS GRAFTING (CABG) X 2 ON CARDIOPULMONARY BYPASS. LIMA TO LAD, SVG TO DIAG;  Surgeon: Ivin Poot, MD;  Location: [place];  Service: Open Heart Surgery;  Laterality: N/A;   ENDOVEIN HARVEST OF GREATER SAPHENOUS VEIN Right 05/01/2020   Procedure: ENDOVEIN HARVEST OF GREATER SAPHENOUS VEIN;  Surgeon: Ivin Poot, MD;  Location: [place];  Service: Open Heart Surgery;  Laterality: Right;   KNEE ARTHROSCOPY  [id number]   left and right   LUMBAR LAMINECTOMY/DECOMPRESSION  MICRODISCECTOMY Left 09/29/2017   Procedure: LEFT LUMBAR TWO - LUMBAR THREELAMINOTOMY/MICRODISCECTOMY;  Surgeon: Jovita Gamma, MD;  Location: [place];  Service: Neurosurgery;  Laterality: Left;  LEFT LUMBAR 2- LUMBAR 3 LAMINOTOMY/MICRODISCECTOMY   NASAL SINUS SURGERY  05/2015   RIGHT/LEFT HEART CATH AND CORONARY ANGIOGRAPHY N/A 04/10/2020   Procedure: RIGHT/LEFT HEART CATH AND CORONARY ANGIOGRAPHY;  Surgeon: Troy Sine, MD;  Location: [facility];  Service: Cardiovascular;  Laterality: N/A;   TEE WITHOUT CARDIOVERSION N/A 05/01/2020   Procedure: TRANSESOPHAGEAL ECHOCARDIOGRAM (TEE);  Surgeon: Prescott Gum, Collier Salina, MD;  Location: [place];  Service: Open Heart Surgery;  Laterality: N/A;   TOTAL KNEE ARTHROPLASTY Right 06/24/2016   Procedure: TOTAL KNEE ARTHROPLASTY;  Surgeon: Garald Balding, MD;  Location: [place];  Service: Orthopedics;  Laterality: Right;   TOTAL KNEE ARTHROPLASTY Left 12/18/2020   Procedure: LEFT TOTAL KNEE ARTHROPLASTY;  Surgeon: Garald Balding, MD;  Location: [facility];  Service: Orthopedics;  Laterality: Left;   TRIGGER FINGER RELEASE Left 11/10/2013   Procedure: LEFT INDEX A-1 PULLEY RELEASE;  Surgeon: Cammie Sickle, MD;  Location: [place];  Service: Orthopedics;  Laterality: Left;   ULNAR NERVE TRANSPOSITION Left 11/10/2013   Procedure: ULNAR NERVE TRANSPOSITION;  Surgeon: Cammie Sickle, MD;  Location: [place];  Service: Orthopedics;  Laterality: Left;    Current Medications: Current Meds  Medication Sig   acetaminophen (TYLENOL) 325 MG tablet Take 2 tablets (650 mg total) by mouth every 6 (six) hours as needed for mild pain or moderate pain (pain score 1-3 or temp > 100.5).   Alirocumab (PRALUENT) 75 MG/ML SOAJ Inject 1 pen  into the skin every 14 (fourteen) days.   aspirin EC 81 MG tablet Take 1 tablet (81 mg total) by mouth 2 (two) times daily after a meal. Swallow whole.   clobetasol cream (TEMOVATE) 5.28 %  Apply 1 application. topically 2 (two) times daily.   diltiazem (CARDIZEM CD) 240 MG 24 hr capsule Take 240 mg by mouth daily.   diphenhydrAMINE (BENADRYL) 25 MG tablet Take 12.5 mg by mouth at bedtime.   fluticasone (FLONASE) 50 MCG/ACT nasal spray Place 2 sprays into both nostrils daily as needed for allergies.    Multiple Vitamin (MULTIVITAMIN WITH MINERALS) TABS tablet Take 1 tablet by mouth in the morning.   olmesartan (BENICAR) 20 MG tablet Take 1 tablet (20 mg total) by mouth daily.   pantoprazole (PROTONIX) 40 MG tablet Take 1 tablet (40 mg total) by mouth daily.   Polyethyl Glycol-Propyl Glycol (LUBRICANT EYE DROPS) 0.4-0.3 % SOLN Place 1 drop into both eyes 3 (three) times daily as needed (dry/irritated eyes).   tiZANidine (ZANAFLEX) 4 MG tablet Take 1 tablet (4 mg total) by mouth at bedtime.  Allergies:   Mushroom extract complex, Penicillins, Atorvastatin, Dilaudid [hydromorphone], Losartan potassium, Omega-3-acid ethyl esters, Oxycodone hcl, Pravastatin, Red dye, Rosuvastatin, Varenicline, Bystolic [nebivolol hcl], Codeine, Iodine, Iohexol, Metoprolol, and Repatha [evolocumab]   Social History   Socioeconomic History   Marital status: Married    Spouse name: [name]   Number of children: 0   Years of education: 12   Highest education level: Not on file  Occupational History   Occupation: Self employed    Comment: plumber  Tobacco Use   Smoking status: Former    Packs/day: 1.00    Years: 37.00    Total pack years: 37.00    Types: Cigarettes    Quit date: 05/04/2016    Years since quitting: 5.5   Smokeless tobacco: Never  Vaping Use   Vaping Use: Former  Substance and Sexual Activity   Alcohol use: Yes    Alcohol/week: 5.0 standard drinks of alcohol    Types: 5 Glasses of wine per week    Comment: weekends only with dinner   Drug use: No   Sexual activity: Not on file  Other Topics Concern   Not on file  Social History Narrative   Lives with wife,  [name]   Caffeine use: 1 Coffee daily   Right handed    Social Determinants of Health   Financial Resource Strain: Not on file  Food Insecurity: Not on file  Transportation Needs: Not on file  Physical Activity: Not on file  Stress: Not on file  Social Connections: Not on file     Family History: The patient's family history includes Cancer in his father; Hypertension in his father and mother; Throat cancer in his mother. There is no history of Colon cancer, Esophageal cancer, Stomach cancer, Rectal cancer, or Colon polyps.  ROS:   Please see the history of present illness.    (+) Bilateral LE numbness of feet All other systems reviewed and are negative.  EKGs/Labs/Other Studies Reviewed:    The following studies were reviewed today:  CT Chest   10/03/2021: IMPRESSION: 1. Lung-RADS 2, benign appearance or behavior. Continue annual screening with low-dose chest CT without contrast in 12 months. 2. Aortic Atherosclerosis (ICD10-I70.0) and Emphysema (ICD10-J43.9). 3. Similar ascending aortic dilatation at on the order of 4.1 cm. Recommend annual imaging followup by CTA or MRA. This recommendation follows 2010 ACCF/AHA/AATS/ACR/ASA/SCA/SCAI/SIR/STS/SVM Guidelines for the Diagnosis and Management of Patients with Thoracic Aortic Disease. Circulation. 2010; 121: A355-D322. Aortic aneurysm NOS (ICD10-I71.9) 4. Aortic valvular calcifications. Consider echocardiography to evaluate for valvular dysfunction.  Right UE Venous Doppler  08/29/2020: Summary:     Right:  No evidence of deep vein thrombosis in the upper extremity. No evidence of  superficial vein thrombosis in the upper extremity.     Left:  No evidence of thrombosis in the subclavian.   Right/Left Heart Cath  04/10/2020: Prox RCA lesion is 80% stenosed. Prox RCA to Mid RCA lesion is 80% stenosed. Non-stenotic 1st Diag-1 lesion. 1st Diag-2 lesion is 30% stenosed. Prox LAD to Mid LAD lesion is 40% stenosed with  40% stenosed side branch in 1st Diag. Ost LAD to Prox LAD lesion is 20% stenosed. Prox Cx lesion is 30% stenosed.   There is evidence for coronary calcification.     The LAD has 20% proximal narrowing and then gives rise to a proximal diagonal vessel which has a focal aneurysm extending from the ostium to the proximal diagonal segment.   In the LAD, at the takeoff of this diagonal  aneurysm,  there appears to be a 30 to 40% narrowing immediately proximal and distal to the diagonal vessel.  The diagonal vessel has a 30% narrowing and a sharp bend of the vessel immediately beyond  the distal portion of the aneurysm.   The circumflex vessel is a dominant vessel with proximal calcification and narrowing of 30% in the region of the first marginal branch.   The RCA is a small nondominant vessel that has proximal and mid 80% stenoses.   Calcified aortic valve with mild aortic valve stenosis.   Normal right heart pressures with no evidence for pulmonary hypertension.   RECOMMENDATION: Angiograms will be reviewed with colleagues.  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  There appears to be mild to moderate disease in the LAD at the aneurysm takeoff as well as just beyond the aneurysm in the diagonal vessel.  Plan initial medical therapy.  The images were reviewed with Dr. Servando Snare.  Patient will follow up with Dr. Marlou Porch.  Diagnostic: Dominance: Left    Echocardiogram  04/05/2020:  1. Left ventricular ejection fraction, by estimation, is 60 to 65%. The  left ventricle has normal function. The left ventricle has no regional  wall motion abnormalities. Left ventricular diastolic parameters are  consistent with Grade I diastolic  dysfunction (impaired relaxation).   2. Right ventricular systolic function is normal. The right ventricular  size is normal.   3. The mitral valve is normal in structure. No evidence of mitral valve  regurgitation.   4. The aortic valve is  calcified. There is moderate calcification of the  aortic valve. There is moderate thickening of the aortic valve. Aortic  valve regurgitation is not visualized. Mild aortic valve stenosis.   5. Aortic dilatation noted. There is mild to moderate dilatation of the  ascending aorta, measuring 40 mm.     Recent Labs: 04/17/2021: TSH 1.430 10/10/2021: ALT 31; BUN 8; Creatinine, Ser 0.63; Hemoglobin 13.9; Platelets 276; Potassium 4.5; Sodium 132 10/16/2021: Pro B Natriuretic peptide (BNP) 50.0   Recent Lipid Panel    Component Value Date/Time   CHOL 122 07/22/2021 0855   TRIG 98 07/22/2021 0855   HDL 60 07/22/2021 0855   CHOLHDL 2.0 07/22/2021 0855   LDLCALC 44 07/22/2021 0855     Risk Assessment/Calculations:      Physical Exam:    VS:  BP 120/70 (BP Location: Left Arm, Patient Position: Sitting, Cuff Size: Normal)   Pulse (!) 106   Ht '5\' 8"'$  (1.727 m)   Wt 150 lb (68 kg)   SpO2 98%   BMI 22.81 kg/m     Wt Readings from Last 3 Encounters:  11/29/21 150 lb (68 kg)  10/16/21 151 lb 6.4 oz (68.7 kg)  09/04/21 150 lb (68 kg)     GEN:  Well nourished, well developed in no acute distress HEENT: Normal NECK: No JVD; No carotid bruits LYMPHATICS: No lymphadenopathy CARDIAC: RRR, 2/6 SM, no rubs, no gallops RESPIRATORY:  Clear to auscultation without rales, wheezing or rhonchi  ABDOMEN: Soft, non-tender, non-distended MUSCULOSKELETAL:  No edema; No deformity  SKIN: Warm and dry; Ecchymosis of bilateral UE NEUROLOGIC:  Alert and oriented x 3 PSYCHIATRIC:  Normal affect   ASSESSMENT:    1. Atherosclerosis of native coronary artery of native heart without angina pectoris   2. Bicuspid aortic valve   3. Chronic tachycardia   4. HNP (herniated nucleus pulposus), lumbar   5. Primary hypertension    PLAN:  In order of problems listed above:  Coronary atherosclerosis of native coronary artery Post CABG LIMA to LAD SVG to diagonal 2021 Dr. Darcey Nora, doing well.  No  adverse symptoms no angina.  Continue with aggressive goal-directed medical therapy.  Aspirin 81 mg.  He does have some bruising on his forearms.  Continue with diltiazem 240.  Heart rate slightly elevated today.  Normal EF.  Praluent.  Bicuspid aortic valve Mild.  We will continue to monitor.  Aortic root 41 mm.  Continue to monitor.  Chronic tachycardia Continue with diltiazem.  HNP (herniated nucleus pulposus), lumbar Working on his core at [employer].  Has upcoming back surgery for lumbar stenosis.  Has numbness in his feet now.  Primary hypertension Olmesartan was decreased from 40 down to 20 previously.  Excellent blood pressure currently.     Follow-up:  6 months.  Medication Adjustments/Labs and Tests Ordered: Current medicines are reviewed at length with the patient today.  Concerns regarding medicines are outlined above.   No orders of the defined types were placed in this encounter.  No orders of the defined types were placed in this encounter.  Patient Instructions  Medication Instructions:  The current medical regimen is effective;  continue present plan and medications.  *If you need a refill on your cardiac medications before your next appointment, please call your pharmacy*  Follow-Up: At [facility], you and your health needs are our priority.  As part of our continuing mission to provide you with exceptional heart care, we have created designated Provider Care Teams.  These Care Teams include your primary Cardiologist (physician) and Advanced Practice Providers (APPs -  Physician Assistants and Nurse Practitioners) who all work together to provide you with the care you need, when you need it.  We recommend signing up for the patient portal called "MyChart".  Sign up information is provided on this After Visit Summary.  MyChart is used to connect with patients for Virtual Visits (Telemedicine).  Patients are able to view lab/test results, encounter notes,  upcoming appointments, etc.  Non-urgent messages can be sent to your provider as well.   To learn more about what you can do with MyChart, go to [email or web address].    Your next appointment:   6 month(s)  The format for your next appointment:   In Person  Provider:   Candee Furbish, MD {   Important Information About Sugar         I,Mathew Stumpf,acting as a scribe for Candee Furbish, MD.,have documented all relevant documentation on the behalf of Candee Furbish, MD,as directed by  Candee Furbish, MD while in the presence of Candee Furbish, MD.  I, Candee Furbish, MD, have reviewed all documentation for this visit. The documentation on 11/29/21 for the exam, diagnosis, procedures, and orders are all accurate and complete.   Signed, Candee Furbish, MD  11/29/2021 9:39 AM    [facility]

## 2021-11-29 NOTE — Assessment & Plan Note (Signed)
Olmesartan was decreased from 40 down to 20 previously.  Excellent blood pressure currently.

## 2021-12-04 DIAGNOSIS — M48061 Spinal stenosis, lumbar region without neurogenic claudication: Secondary | ICD-10-CM | POA: Diagnosis not present

## 2021-12-16 ENCOUNTER — Other Ambulatory Visit (HOSPITAL_BASED_OUTPATIENT_CLINIC_OR_DEPARTMENT_OTHER): Payer: Self-pay

## 2021-12-16 ENCOUNTER — Other Ambulatory Visit: Payer: Self-pay | Admitting: Internal Medicine

## 2021-12-16 ENCOUNTER — Telehealth: Payer: Self-pay | Admitting: Internal Medicine

## 2021-12-16 DIAGNOSIS — Z298 Encounter for other specified prophylactic measures: Secondary | ICD-10-CM

## 2021-12-16 MED ORDER — AZITHROMYCIN 500 MG PO TABS: 500.0000 mg | ORAL_TABLET | Freq: Once | ORAL | 0 refills | Status: AC

## 2021-12-16 NOTE — Telephone Encounter (Signed)
Patient has a dentist appointment scheduled for 12/19/2021 - his dentist told him that he would need to be on an antibiotic before visit - please send in to [facility] on [address].

## 2021-12-17 ENCOUNTER — Other Ambulatory Visit (HOSPITAL_BASED_OUTPATIENT_CLINIC_OR_DEPARTMENT_OTHER): Payer: Self-pay

## 2021-12-18 ENCOUNTER — Other Ambulatory Visit (HOSPITAL_BASED_OUTPATIENT_CLINIC_OR_DEPARTMENT_OTHER): Payer: Self-pay

## 2021-12-18 MED ORDER — DILTIAZEM HCL ER COATED BEADS 240 MG PO CP24
240.0000 mg | ORAL_CAPSULE | Freq: Every day | ORAL | 1 refills | Status: DC
  Filled 2021-12-25: qty 90, 90d supply, fill #0

## 2021-12-18 MED ORDER — TAMSULOSIN HCL 0.4 MG PO CAPS: 0.4000 mg | ORAL_CAPSULE | Freq: Every day | ORAL | 11 refills | Status: DC

## 2021-12-18 MED ORDER — PRALUENT 75 MG/ML ~~LOC~~ SOAJ
75.0000 mg | SUBCUTANEOUS | 11 refills | Status: DC
  Filled 2022-02-11: qty 2, 28d supply, fill #0
  Filled 2022-05-05: qty 2, 27d supply, fill #1
  Filled 2022-05-07: qty 2, 28d supply, fill #1

## 2021-12-25 ENCOUNTER — Other Ambulatory Visit (HOSPITAL_BASED_OUTPATIENT_CLINIC_OR_DEPARTMENT_OTHER): Payer: Self-pay

## 2022-01-10 ENCOUNTER — Other Ambulatory Visit (HOSPITAL_BASED_OUTPATIENT_CLINIC_OR_DEPARTMENT_OTHER): Payer: Self-pay

## 2022-01-13 ENCOUNTER — Other Ambulatory Visit (HOSPITAL_BASED_OUTPATIENT_CLINIC_OR_DEPARTMENT_OTHER): Payer: Self-pay

## 2022-01-28 ENCOUNTER — Encounter: Payer: Self-pay | Admitting: Internal Medicine

## 2022-01-28 ENCOUNTER — Ambulatory Visit: Payer: BC Managed Care – PPO | Attending: Internal Medicine | Admitting: Internal Medicine

## 2022-01-28 ENCOUNTER — Other Ambulatory Visit (HOSPITAL_BASED_OUTPATIENT_CLINIC_OR_DEPARTMENT_OTHER): Payer: Self-pay

## 2022-01-28 VITALS — BP 126/80 | HR 101 | Resp 15 | Ht 68.0 in | Wt 154.6 lb

## 2022-01-28 DIAGNOSIS — M79642 Pain in left hand: Secondary | ICD-10-CM | POA: Diagnosis not present

## 2022-01-28 DIAGNOSIS — M79641 Pain in right hand: Secondary | ICD-10-CM | POA: Diagnosis not present

## 2022-01-28 MED ORDER — COLCHICINE 0.6 MG PO TABS
0.6000 mg | ORAL_TABLET | Freq: Every day | ORAL | 1 refills | Status: DC
Start: 2022-01-28 — End: 2022-03-27
  Filled 2022-01-28: qty 30, 30d supply, fill #0

## 2022-01-28 NOTE — Progress Notes (Signed)
Office Visit Note  Patient: Philip Winters             Date of Birth: April 27, 1961           MRN: [id number]             PCP: Janith Lima, MD Referring: Janith Lima, MD Visit Date: 01/28/2022   Subjective:  Follow-up (Arthritis in hands, pain in left knee. )   History of Present Illness: Philip Winters is a 61 y.o. male here for follow up for evaluation of hand pain and swelling along with continues chronic left knee pain.  Evaluation at visit last year demonstrated osteoarthritis of multiple areas also questionable finding for CPPD or other seronegative inflammation.  He has been having pain fairly consistently in the hand worse around the thumb usually without a lot of swelling.  He has noticed flareup or increase of symptoms when he is excess icing or carrying weights more than usual.  Has known carpal tunnel syndrome and is planned for release surgery later this year also for treatment of his trigger finger.   Previous HPI 09/20/2020 [name] is a 61 y.o. male here for evaluation of hand pain and swelling.  He has a history of bilateral knee arthritis with previous replacement of the right knee chronic osteoarthritis pain in bilateral hands and has had previous surgery for left carpal tunnel and cubital tunnel syndrome years ago.  Current problems got worse after he underwent coronary artery bypass grafting for coronary disease diagnosed after he was noticing increased exertional dyspnea.  Since then he has been having a lot of increased pain in his bilateral arms he has had advanced imaging of bilateral shoulders showing tendinitis and bursitis of the left shoulder more recently showing a labral tear and partial thickness rotator cuff tendon tear of the right shoulder.  Steroid injections have helped in the left shoulder symptoms but right shoulder is still extremely painful.  Additionally he is noticing swelling occurring in both hands sometimes around the thumb joint and sometimes in the  entire hand.  He does not feel like the symptoms are similar to his previous carpal tunnel syndrome although recent neurologic testing was indicative for some sensory and motor neuron loss favoring recurrence over residual past injury.    Labs reviewed 07/2020 ANA neg RF neg Uric acid 3.7 ESR 80   Activities of Daily Living:  Patient reports morning stiffness for 2 hours.   Patient Denies nocturnal pain.  Difficulty dressing/grooming: Denies Difficulty climbing stairs: Reports Difficulty getting out of chair: Denies Difficulty using hands for taps, buttons, cutlery, and/or writing: Reports   Review of Systems  Constitutional:  Negative for fatigue.  HENT:  Negative for mouth sores and mouth dryness.   Eyes:  Negative for dryness.  Respiratory:  Negative for shortness of breath.   Cardiovascular:  Negative for chest pain and palpitations.  Gastrointestinal:  Negative for blood in stool, constipation and diarrhea.  Endocrine: Negative for increased urination.  Genitourinary:  Negative for involuntary urination.  Musculoskeletal:  Positive for joint pain, joint pain, joint swelling and morning stiffness. Negative for gait problem, myalgias, muscle weakness, muscle tenderness and myalgias.  Skin:  Negative for color change, rash, hair loss and sensitivity to sunlight.  Allergic/Immunologic: Negative for susceptible to infections.  Neurological:  Negative for dizziness and headaches.  Hematological:  Negative for swollen glands.  Psychiatric/Behavioral:  Negative for depressed mood and sleep disturbance. The patient is not nervous/anxious.  PMFS History:  Patient Active Problem List   Diagnosis Date Noted   SBE (subacute bacterial endocarditis) prophylaxis candidate 12/16/2021   Trigger finger, left ring finger 11/06/2021   Cervical radiculopathy 08/13/2021   Ulnar neuropathy 08/13/2021   Screen for colon cancer 06/06/2021   Chronic tachycardia 06/04/2021   Candidal balanitis  06/04/2021   Carpal tunnel syndrome, bilateral upper limbs 09/26/2020   Cubital tunnel syndrome on left 08/23/2020   Pulmonary nodule 1 cm or greater in diameter 04/26/2020   Statin intolerance 04/20/2020   Coronary atherosclerosis of native coronary artery 04/18/2020   Nonrheumatic aortic valve stenosis    Bicuspid aortic valve 04/06/2020   Primary osteoarthritis of left knee 11/08/2019   Bilateral primary osteoarthritis of knee 09/28/2019   HNP (herniated nucleus pulposus), lumbar 09/29/2017   Unilateral primary osteoarthritis, right knee 06/24/2016   Primary hypertension 01/18/2007    Past Medical History:  Diagnosis Date   Allergy    Arthritis    CAD (coronary artery disease)    Coronary artery disease    GERD (gastroesophageal reflux disease)    occ   Heart murmur    baby   High cholesterol    Hypertension    Peripheral neuropathy    Pneumonia    Right carotid bruit 06/10/2017   Sciatica    Seasonal allergies    Sinus tachycardia    Spinal stenosis     Family History  Problem Relation Age of Onset   Hypertension Mother    Throat cancer Mother    Hypertension Father    Cancer Father    Colon cancer Neg Hx    Esophageal cancer Neg Hx    Stomach cancer Neg Hx    Rectal cancer Neg Hx    Colon polyps Neg Hx    Past Surgical History:  Procedure Laterality Date   CARDIAC CATHETERIZATION     CARPAL TUNNEL WITH CUBITAL TUNNEL Left 11/10/2013   Procedure: LEFT CARPAL TUNNEL RELEASE;  Surgeon: Cammie Sickle, MD;  Location: [place];  Service: Orthopedics;  Laterality: Left;   COLONOSCOPY  2013   at [facility], -normal exam per pt   CORONARY ARTERY BYPASS GRAFT N/A 05/01/2020   Procedure: CORONARY ARTERY BYPASS GRAFTING (CABG) X 2 ON CARDIOPULMONARY BYPASS. LIMA TO LAD, SVG TO DIAG;  Surgeon: Ivin Poot, MD;  Location: [place];  Service: Open Heart Surgery;  Laterality: N/A;   ENDOVEIN HARVEST OF GREATER SAPHENOUS VEIN Right 05/01/2020    Procedure: ENDOVEIN HARVEST OF GREATER SAPHENOUS VEIN;  Surgeon: Ivin Poot, MD;  Location: [place];  Service: Open Heart Surgery;  Laterality: Right;   KNEE ARTHROSCOPY  [id number]   left and right   LUMBAR LAMINECTOMY/DECOMPRESSION MICRODISCECTOMY Left 09/29/2017   Procedure: LEFT LUMBAR TWO - LUMBAR THREELAMINOTOMY/MICRODISCECTOMY;  Surgeon: Jovita Gamma, MD;  Location: [place];  Service: Neurosurgery;  Laterality: Left;  LEFT LUMBAR 2- LUMBAR 3 LAMINOTOMY/MICRODISCECTOMY   NASAL SINUS SURGERY  05/2015   RIGHT/LEFT HEART CATH AND CORONARY ANGIOGRAPHY N/A 04/10/2020   Procedure: RIGHT/LEFT HEART CATH AND CORONARY ANGIOGRAPHY;  Surgeon: Troy Sine, MD;  Location: [place] [facility];  Service: Cardiovascular;  Laterality: N/A;   TEE WITHOUT CARDIOVERSION N/A 05/01/2020   Procedure: TRANSESOPHAGEAL ECHOCARDIOGRAM (TEE);  Surgeon: Prescott Gum, Collier Salina, MD;  Location: [place];  Service: Open Heart Surgery;  Laterality: N/A;   TOTAL KNEE ARTHROPLASTY Right 06/24/2016   Procedure: TOTAL KNEE ARTHROPLASTY;  Surgeon: Garald Balding, MD;  Location: [place];  Service: Orthopedics;  Laterality: Right;   TOTAL KNEE ARTHROPLASTY Left 12/18/2020   Procedure: LEFT TOTAL KNEE ARTHROPLASTY;  Surgeon: Garald Balding, MD;  Location: [facility];  Service: Orthopedics;  Laterality: Left;   TRIGGER FINGER RELEASE Left 11/10/2013   Procedure: LEFT INDEX A-1 PULLEY RELEASE;  Surgeon: Cammie Sickle, MD;  Location: [place];  Service: Orthopedics;  Laterality: Left;   ULNAR NERVE TRANSPOSITION Left 11/10/2013   Procedure: ULNAR NERVE TRANSPOSITION;  Surgeon: Cammie Sickle, MD;  Location: [place];  Service: Orthopedics;  Laterality: Left;   Social History   Social History Narrative   Lives with wife, [name]   Caffeine use: 1 Coffee daily   Right handed    Immunization History  Administered Date(s) Administered   Influenza Split 02/20/2012   Influenza,inj,Quad  PF,6+ Mos 01/25/2013   Tdap 09/18/2016     Objective: Vital Signs: BP 126/80 (BP Location: Right Arm, Patient Position: Sitting, Cuff Size: Normal)   Pulse (!) 101   Resp 15   Ht _0  (1.727 m)   Wt 154 lb 9.6 oz (70.1 kg)   BMI 23.51 kg/m    Physical Exam Cardiovascular:     Rate and Rhythm: Normal rate and regular rhythm.  Pulmonary:     Effort: Pulmonary effort is normal.     Breath sounds: Normal breath sounds.  Skin:    General: Skin is warm and dry.     Findings: No rash.  Neurological:     Mental Status: He is alert.  Psychiatric:        Mood and Affect: Mood normal.      Musculoskeletal Exam:  Right shoulder pain with rotation and tenderness to pressure with no palpable swelling, left shoulder full range of motion Elbows full ROM no tenderness or swelling First CMC joint squaring in bilateral hands, tenderness to pressure at the base of the right thumb and along the radial border of right wrist joint, no palpable joint effusions Full ROM left knee tenderness no palpable effusion, patellofemoral crepitus present   Investigation: No additional findings.  Imaging: No results found.  Recent Labs: Lab Results  Component Value Date   WBC 6.9 10/10/2021   HGB 13.9 10/10/2021   PLT 276 10/10/2021   NA 132 (L) 10/10/2021   K 4.5 10/10/2021   CL 96 10/10/2021   CO2 21 10/10/2021   GLUCOSE 109 (H) 10/10/2021   BUN 8 10/10/2021   CREATININE 0.63 (L) 10/10/2021   BILITOT 0.5 10/10/2021   ALKPHOS 169 (H) 10/10/2021   AST 31 10/10/2021   ALT 31 10/10/2021   PROT 6.5 10/10/2021   ALBUMIN 4.2 10/10/2021   CALCIUM 8.8 10/10/2021   GFRAA 115 04/06/2020    Speciality Comments: No specialty comments available.  Procedures:  No procedures performed Allergies: Mushroom extract complex, Penicillins, Atorvastatin, Dilaudid [hydromorphone], Losartan potassium, Omega-3-acid ethyl esters, Oxycodone hcl, Pravastatin, Red dye, Rosuvastatin, Varenicline, Bystolic  [nebivolol hcl], Codeine, Iodine, Iohexol, Metoprolol, and Repatha [evolocumab]   Assessment / Plan:     Visit Diagnoses: Bilateral hand pain - Plan: colchicine 0.6 MG tablet  Definitely has osteoarthritis which could explain symptoms unfortunately not a good candidate for long-term use of NSAIDs.  Does have some calcifications on x-ray imaging could be consistent with CPPD although I do not see any definitive synovitis.  Recommend a trial of colchicine 0.6 mg tablet see if he notices an appreciable difference in joint pain and stiffness.  Orders: No  orders of the defined types were placed in this encounter.  Meds ordered this encounter  Medications   colchicine 0.6 MG tablet    Sig: Take 1 tablet (0.6 mg total) by mouth daily.    Dispense:  30 tablet    Refill:  1     Follow-Up Instructions: Return in about 10 weeks (around 04/08/2022), or if symptoms worsen or fail to improve, for OA/?CPPD f/u 2-3 mos.   Collier Salina, MD  Note - This record has been created using [employer].  Chart creation errors have been sought, but may not always  have been located. Such creation errors do not reflect on  the standard of medical care.

## 2022-01-28 NOTE — Patient Instructions (Signed)
I recommend taking the colchicine for current joint swelling that may be from the problem CPPD. Start with 2 tablets daily if symptoms improve or not tolerating can go down to 1 daily.   For osteoarthritis several treatments may be beneficial:  - Topical antiinflammatory medicine such as diclofenac or Voltaren can be applied to  affected area as needed but may be less effective than oral antiinflammatory medicine. Topical analgesics containing CBD, menthol, or lidocaine can be tried.  - Oral nonsteroidal antiinflammatory drugs (NSAIDs) such as ibuprofen, aleve, celebrex, or mobic are very helpful for osteoarthritis but can cause side effects such as stomach ulcers or hypertension with prolonged use. These should be taken intermittently or as needed, and always taken with food.  - Turmeric has some antiinflammatory effect similar to NSAIDs and may help, if taken as a supplement should not be taken above recommended doses. Other oral supplements such as glucosamine chondroitin containing OTC treatments do not have as strong data supporting effectiveness but can be helpful for some individuals and have no major side effects.   - Compressive gloves can be helpful to support the fingers and thumb joint especially if hurting with certain activities or if swelling overnight.  - Occupational therapy referral can discuss exercises or activity modification to improve symptoms or strength if needed.  - Local steroid injection is an option if symptoms become worse and not controlled by the above options.

## 2022-02-04 ENCOUNTER — Encounter: Payer: Self-pay | Admitting: Orthopaedic Surgery

## 2022-02-04 ENCOUNTER — Ambulatory Visit: Payer: BC Managed Care – PPO | Admitting: Orthopaedic Surgery

## 2022-02-04 ENCOUNTER — Ambulatory Visit (INDEPENDENT_AMBULATORY_CARE_PROVIDER_SITE_OTHER): Payer: BC Managed Care – PPO

## 2022-02-04 DIAGNOSIS — G8929 Other chronic pain: Secondary | ICD-10-CM

## 2022-02-04 DIAGNOSIS — M79645 Pain in left finger(s): Secondary | ICD-10-CM | POA: Diagnosis not present

## 2022-02-04 NOTE — Progress Notes (Addendum)
Office Visit Note   Patient: Philip Winters           Date of Birth: 1960/08/31           MRN: [id number] Visit Date: 02/04/2022              Requested by: Janith Lima, MD [address] [place],  [place] [address] PCP: Janith Lima, MD   Assessment & Plan: Visit Diagnoses:  1. Chronic pain of left thumb     Plan: Impression is left thumb distal phalanx fracture.  We will immobilize in AlumaFoam splint for 4 weeks.  He may take off for hygiene.  Follow-up in 4 weeks with two-view x-rays of the left thumb.  Follow-Up Instructions: Return in about 4 weeks (around 03/04/2022).   Orders:  Orders Placed This Encounter  Procedures   XR Finger Thumb Left   No orders of the defined types were placed in this encounter.     Procedures: No procedures performed   Clinical Data: No additional findings.   Subjective: Chief Complaint  Patient presents with   Left Hand - Pain    Thumb injury 02/03/2022    HPI Mr. Philip Winters is a very pleasant 61 year old gentleman who comes in for an acute injury to his left thumb.  Had a mechanical fall at night when getting back into bed.  Feels swelling bruising and pain to the left thumb tip.  Review of Systems  Constitutional: Negative.   All other systems reviewed and are negative.    Objective: Vital Signs: There were no vitals taken for this visit.  Physical Exam Vitals and nursing note reviewed.  Constitutional:      Appearance: He is well-developed.  Pulmonary:     Effort: Pulmonary effort is normal.  Abdominal:     Palpations: Abdomen is soft.  Skin:    General: Skin is warm.  Neurological:     Mental Status: He is alert and oriented to person, place, and time.  Psychiatric:        Behavior: Behavior normal.        Thought Content: Thought content normal.        Judgment: Judgment normal.     Ortho Exam Lamination of left thumb shows moderate swelling.  Small subungual hematoma.  Tenderness palpation of the  distal phalanx.  Limited range of motion secondary to pain.  Normal capillary refill. Specialty Comments:  No specialty comments available.  Imaging: XR Finger Thumb Left  Result Date: 02/04/2022 Nondisplaced slightly comminuted distal phalanx fracture    PMFS History: Patient Active Problem List   Diagnosis Date Noted   SBE (subacute bacterial endocarditis) prophylaxis candidate 12/16/2021   Trigger finger, left ring finger 11/06/2021   Cervical radiculopathy 08/13/2021   Ulnar neuropathy 08/13/2021   Screen for colon cancer 06/06/2021   Chronic tachycardia 06/04/2021   Candidal balanitis 06/04/2021   Carpal tunnel syndrome, bilateral upper limbs 09/26/2020   Cubital tunnel syndrome on left 08/23/2020   Pulmonary nodule 1 cm or greater in diameter 04/26/2020   Statin intolerance 04/20/2020   Coronary atherosclerosis of native coronary artery 04/18/2020   Nonrheumatic aortic valve stenosis    Bicuspid aortic valve 04/06/2020   Primary osteoarthritis of left knee 11/08/2019   Bilateral primary osteoarthritis of knee 09/28/2019   HNP (herniated nucleus pulposus), lumbar 09/29/2017   Unilateral primary osteoarthritis, right knee 06/24/2016   Primary hypertension 01/18/2007   Past Medical History:  Diagnosis Date   Allergy  Arthritis    CAD (coronary artery disease)    Coronary artery disease    GERD (gastroesophageal reflux disease)    occ   Heart murmur    baby   High cholesterol    Hypertension    Peripheral neuropathy    Pneumonia    Right carotid bruit 06/10/2017   Sciatica    Seasonal allergies    Sinus tachycardia    Spinal stenosis     Family History  Problem Relation Age of Onset   Hypertension Mother    Throat cancer Mother    Hypertension Father    Cancer Father    Colon cancer Neg Hx    Esophageal cancer Neg Hx    Stomach cancer Neg Hx    Rectal cancer Neg Hx    Colon polyps Neg Hx     Past Surgical History:  Procedure Laterality Date    CARDIAC CATHETERIZATION     CARPAL TUNNEL WITH CUBITAL TUNNEL Left 11/10/2013   Procedure: LEFT CARPAL TUNNEL RELEASE;  Surgeon: Cammie Sickle, MD;  Location: [place];  Service: Orthopedics;  Laterality: Left;   COLONOSCOPY  2013   at [facility] [place]-normal exam per pt   CORONARY ARTERY BYPASS GRAFT N/A 05/01/2020   Procedure: CORONARY ARTERY BYPASS GRAFTING (CABG) X 2 ON CARDIOPULMONARY BYPASS. LIMA TO LAD, SVG TO DIAG;  Surgeon: Ivin Poot, MD;  Location: [place];  Service: Open Heart Surgery;  Laterality: N/A;   ENDOVEIN HARVEST OF GREATER SAPHENOUS VEIN Right 05/01/2020   Procedure: ENDOVEIN HARVEST OF GREATER SAPHENOUS VEIN;  Surgeon: Ivin Poot, MD;  Location: [place];  Service: Open Heart Surgery;  Laterality: Right;   KNEE ARTHROSCOPY  [id number]   left and right   LUMBAR LAMINECTOMY/DECOMPRESSION MICRODISCECTOMY Left 09/29/2017   Procedure: LEFT LUMBAR TWO - LUMBAR THREELAMINOTOMY/MICRODISCECTOMY;  Surgeon: Jovita Gamma, MD;  Location: [place];  Service: Neurosurgery;  Laterality: Left;  LEFT LUMBAR 2- LUMBAR 3 LAMINOTOMY/MICRODISCECTOMY   NASAL SINUS SURGERY  05/2015   RIGHT/LEFT HEART CATH AND CORONARY ANGIOGRAPHY N/A 04/10/2020   Procedure: RIGHT/LEFT HEART CATH AND CORONARY ANGIOGRAPHY;  Surgeon: Troy Sine, MD;  Location: [place] [facility];  Service: Cardiovascular;  Laterality: N/A;   TEE WITHOUT CARDIOVERSION N/A 05/01/2020   Procedure: TRANSESOPHAGEAL ECHOCARDIOGRAM (TEE);  Surgeon: Prescott Gum, Collier Salina, MD;  Location: [place];  Service: Open Heart Surgery;  Laterality: N/A;   TOTAL KNEE ARTHROPLASTY Right 06/24/2016   Procedure: TOTAL KNEE ARTHROPLASTY;  Surgeon: Garald Balding, MD;  Location: [place];  Service: Orthopedics;  Laterality: Right;   TOTAL KNEE ARTHROPLASTY Left 12/18/2020   Procedure: LEFT TOTAL KNEE ARTHROPLASTY;  Surgeon: Garald Balding, MD;  Location[facility];  Service: Orthopedics;  Laterality: Left;   TRIGGER FINGER  RELEASE Left 11/10/2013   Procedure: LEFT INDEX A-1 PULLEY RELEASE;  Surgeon: Cammie Sickle, MD;  Location: [place];  Service: Orthopedics;  Laterality: Left;   ULNAR NERVE TRANSPOSITION Left 11/10/2013   Procedure: ULNAR NERVE TRANSPOSITION;  Surgeon: Cammie Sickle, MD;  Location: [place];  Service: Orthopedics;  Laterality: Left;   Social History   Occupational History   Occupation: Self employed    Comment: plumber  Tobacco Use   Smoking status: Former    Packs/day: 1.00    Years: 37.00    Total pack years: 37.00    Types: Cigarettes    Quit date: 05/04/2016    Years since quitting:  5.7   Smokeless tobacco: Never  Vaping Use   Vaping Use: Former  Substance and Sexual Activity   Alcohol use: Yes    Alcohol/week: 5.0 standard drinks of alcohol    Types: 5 Glasses of wine per week    Comment: weekends only with dinner   Drug use: No   Sexual activity: Not on file

## 2022-02-11 ENCOUNTER — Other Ambulatory Visit (HOSPITAL_BASED_OUTPATIENT_CLINIC_OR_DEPARTMENT_OTHER): Payer: Self-pay

## 2022-02-12 ENCOUNTER — Other Ambulatory Visit (HOSPITAL_BASED_OUTPATIENT_CLINIC_OR_DEPARTMENT_OTHER): Payer: Self-pay

## 2022-02-19 ENCOUNTER — Encounter: Payer: Self-pay | Admitting: Cardiology

## 2022-02-20 ENCOUNTER — Other Ambulatory Visit: Payer: Self-pay | Admitting: Neurological Surgery

## 2022-02-21 ENCOUNTER — Other Ambulatory Visit: Payer: Self-pay | Admitting: Neurological Surgery

## 2022-02-25 ENCOUNTER — Ambulatory Visit: Payer: BC Managed Care – PPO | Admitting: Orthopaedic Surgery

## 2022-02-25 ENCOUNTER — Encounter: Payer: Self-pay | Admitting: Internal Medicine

## 2022-02-25 ENCOUNTER — Encounter: Payer: Self-pay | Admitting: Orthopaedic Surgery

## 2022-02-25 ENCOUNTER — Ambulatory Visit (INDEPENDENT_AMBULATORY_CARE_PROVIDER_SITE_OTHER): Payer: BC Managed Care – PPO

## 2022-02-25 DIAGNOSIS — S62525A Nondisplaced fracture of distal phalanx of left thumb, initial encounter for closed fracture: Secondary | ICD-10-CM | POA: Diagnosis not present

## 2022-02-25 DIAGNOSIS — G8929 Other chronic pain: Secondary | ICD-10-CM | POA: Insufficient documentation

## 2022-02-25 DIAGNOSIS — M65342 Trigger finger, left ring finger: Secondary | ICD-10-CM

## 2022-02-25 MED ORDER — METHYLPREDNISOLONE ACETATE 40 MG/ML IJ SUSP
13.3300 mg | INTRAMUSCULAR | Status: AC | PRN
  Administered 2022-02-25: 13.33 mg

## 2022-02-25 MED ORDER — LIDOCAINE HCL 1 % IJ SOLN
0.3000 mL | INTRAMUSCULAR | Status: AC | PRN
  Administered 2022-02-25: .3 mL

## 2022-02-25 MED ORDER — BUPIVACAINE HCL 0.5 % IJ SOLN
0.3300 mL | INTRAMUSCULAR | Status: AC | PRN
  Administered 2022-02-25: .33 mL

## 2022-02-25 NOTE — Progress Notes (Signed)
Office Visit Note   Patient: Philip Winters           Date of Birth: [birth year]           MRN: [id number] Visit Date: 02/25/2022              Requested by: Janith Lima, MD [address] [place],  [place] [address] PCP: Janith Lima, MD   Assessment & Plan: Visit Diagnoses:  1. Closed nondisplaced fracture of distal phalanx of left thumb, initial encounter   2. Trigger finger, left ring finger     Plan: For the thumb at this point he can begin some gentle range of motion.  I have sent a referral to hand therapy.  Continue wearing the splint in public.  For the trigger finger injection was performed which he tolerated well.  We will see him back as needed for this issue.  For the thumb we will see him back in 4 weeks with two-view x-rays of the left thumb.  Follow-Up Instructions: Return in about 4 weeks (around 03/25/2022).   Orders:  Orders Placed This Encounter  Procedures   XR Finger Thumb Left   Ambulatory referral to Occupational Therapy   No orders of the defined types were placed in this encounter.     Procedures: Hand/UE Inj: L ring A1 for trigger finger on 02/25/2022 11:41 AM Indications: pain Details: 25 G needle Medications: 0.3 mL lidocaine 1 %; 0.33 mL bupivacaine 0.5 %; 13.33 mg methylPREDNISolone acetate 40 MG/ML Outcome: tolerated well, no immediate complications Consent was given by the patient. Patient was prepped and draped in the usual sterile fashion.       Clinical Data: No additional findings.   Subjective: Chief Complaint  Patient presents with   Left Thumb - Pain    HPI Patient returns today for follow-up of left thumb fracture as well as left ring trigger finger.  Review of Systems   Objective: Vital Signs: There were no vitals taken for this visit.  Physical Exam  Ortho Exam Examination of the left thumb shows an adherent old nail with fracture hematoma underneath that is coagulated.  No signs of  infection.  Examination of the left ring finger is consistent with a trigger finger. Specialty Comments:  No specialty comments available.  Imaging: XR Finger Thumb Left  Result Date: 02/25/2022 The x-rays demonstrate stable alignment of nondisplaced comminuted distal phalanx fracture.  No significant evidence of healing.  No evidence of worsening.    PMFS History: Patient Active Problem List   Diagnosis Date Noted   Chronic pain of left thumb 02/25/2022   SBE (subacute bacterial endocarditis) prophylaxis candidate 12/16/2021   Trigger finger, left ring finger 11/06/2021   Cervical radiculopathy 08/13/2021   Ulnar neuropathy 08/13/2021   Screen for colon cancer 06/06/2021   Chronic tachycardia 06/04/2021   Candidal balanitis 06/04/2021   Carpal tunnel syndrome, bilateral upper limbs 09/26/2020   Cubital tunnel syndrome on left 08/23/2020   Pulmonary nodule 1 cm or greater in diameter 04/26/2020   Statin intolerance 04/20/2020   Coronary atherosclerosis of native coronary artery 04/18/2020   Nonrheumatic aortic valve stenosis    Bicuspid aortic valve 04/06/2020   Primary osteoarthritis of left knee 11/08/2019   Bilateral primary osteoarthritis of knee 09/28/2019   HNP (herniated nucleus pulposus), lumbar 09/29/2017   Unilateral primary osteoarthritis, right knee 06/24/2016   Primary hypertension 01/18/2007   Past Medical History:  Diagnosis Date   Allergy  Arthritis    CAD (coronary artery disease)    Coronary artery disease    GERD (gastroesophageal reflux disease)    occ   Heart murmur    baby   High cholesterol    Hypertension    Peripheral neuropathy    Pneumonia    Right carotid bruit 06/10/2017   Sciatica    Seasonal allergies    Sinus tachycardia    Spinal stenosis     Family History  Problem Relation Age of Onset   Hypertension Mother    Throat cancer Mother    Hypertension Father    Cancer Father    Colon cancer Neg Hx    Esophageal cancer Neg  Hx    Stomach cancer Neg Hx    Rectal cancer Neg Hx    Colon polyps Neg Hx     Past Surgical History:  Procedure Laterality Date   CARDIAC CATHETERIZATION     CARPAL TUNNEL WITH CUBITAL TUNNEL Left 11/10/2013   Procedure: LEFT CARPAL TUNNEL RELEASE;  Surgeon: Cammie Sickle, MD;  Location: [place];  Service: Orthopedics;  Laterality: Left;   COLONOSCOPY  2013   at [facility], [place]-normal exam per pt   CORONARY ARTERY BYPASS GRAFT N/A 05/01/2020   Procedure: CORONARY ARTERY BYPASS GRAFTING (CABG) X 2 ON CARDIOPULMONARY BYPASS. LIMA TO LAD, SVG TO DIAG;  Surgeon: Ivin Poot, MD;  Location: [place];  Service: Open Heart Surgery;  Laterality: N/A;   ENDOVEIN HARVEST OF GREATER SAPHENOUS VEIN Right 05/01/2020   Procedure: ENDOVEIN HARVEST OF GREATER SAPHENOUS VEIN;  Surgeon: Ivin Poot, MD;  Location: [place];  Service: Open Heart Surgery;  Laterality: Right;   KNEE ARTHROSCOPY  [id number]   left and right   LUMBAR LAMINECTOMY/DECOMPRESSION MICRODISCECTOMY Left 09/29/2017   Procedure: LEFT LUMBAR TWO - LUMBAR THREELAMINOTOMY/MICRODISCECTOMY;  Surgeon: Jovita Gamma, MD;  Location: [place];  Service: Neurosurgery;  Laterality: Left;  LEFT LUMBAR 2- LUMBAR 3 LAMINOTOMY/MICRODISCECTOMY   NASAL SINUS SURGERY  05/2015   RIGHT/LEFT HEART CATH AND CORONARY ANGIOGRAPHY N/A 04/10/2020   Procedure: RIGHT/LEFT HEART CATH AND CORONARY ANGIOGRAPHY;  Surgeon: Troy Sine, MD;  Location: [facility];  Service: Cardiovascular;  Laterality: N/A;   TEE WITHOUT CARDIOVERSION N/A 05/01/2020   Procedure: TRANSESOPHAGEAL ECHOCARDIOGRAM (TEE);  Surgeon: Prescott Gum, Collier Salina, MD;  Location: [place];  Service: Open Heart Surgery;  Laterality: N/A;   TOTAL KNEE ARTHROPLASTY Right 06/24/2016   Procedure: TOTAL KNEE ARTHROPLASTY;  Surgeon: Garald Balding, MD;  Location: [place];  Service: Orthopedics;  Laterality: Right;   TOTAL KNEE ARTHROPLASTY Left 12/18/2020   Procedure: LEFT  TOTAL KNEE ARTHROPLASTY;  Surgeon: Garald Balding, MD;  [facility];  Service: Orthopedics;  Laterality: Left;   TRIGGER FINGER RELEASE Left 11/10/2013   Procedure: LEFT INDEX A-1 PULLEY RELEASE;  Surgeon: Cammie Sickle, MD;  Location: [facility] [place];  Service: Orthopedics;  Laterality: Left;   ULNAR NERVE TRANSPOSITION Left 11/10/2013   Procedure: ULNAR NERVE TRANSPOSITION;  Surgeon: Cammie Sickle, MD;  Location: [place];  Service: Orthopedics;  Laterality: Left;   Social History   Occupational History   Occupation: Self employed    Comment: plumber  Tobacco Use   Smoking status: Former    Packs/day: 1.00    Years: 37.00    Total pack years: 37.00    Types: Cigarettes    Quit date: 05/04/2016    Years since quitting:  5.8   Smokeless tobacco: Never  Vaping Use   Vaping Use: Former  Substance and Sexual Activity   Alcohol use: Yes    Alcohol/week: 5.0 standard drinks of alcohol    Types: 5 Glasses of wine per week    Comment: weekends only with dinner   Drug use: No   Sexual activity: Not on file

## 2022-03-03 ENCOUNTER — Encounter: Payer: Self-pay | Admitting: Rehabilitative and Restorative Service Providers"

## 2022-03-03 ENCOUNTER — Other Ambulatory Visit: Payer: Self-pay

## 2022-03-03 ENCOUNTER — Ambulatory Visit: Payer: BC Managed Care – PPO | Admitting: Rehabilitative and Restorative Service Providers"

## 2022-03-03 DIAGNOSIS — R278 Other lack of coordination: Secondary | ICD-10-CM | POA: Diagnosis not present

## 2022-03-03 DIAGNOSIS — R600 Localized edema: Secondary | ICD-10-CM

## 2022-03-03 DIAGNOSIS — M79642 Pain in left hand: Secondary | ICD-10-CM

## 2022-03-03 DIAGNOSIS — M25532 Pain in left wrist: Secondary | ICD-10-CM

## 2022-03-03 DIAGNOSIS — M25632 Stiffness of left wrist, not elsewhere classified: Secondary | ICD-10-CM

## 2022-03-03 NOTE — Therapy (Signed)
OUTPATIENT OCCUPATIONAL THERAPY ORTHO EVALUATION  Patient Name: Philip Winters MRN: [id number] DOB:13-Feb-1961, 61 y.o., male Today's Date: 03/03/2022  PCP: Scarlette Calico, MD REFERRING PROVIDER: Leandrew Koyanagi, MD   OT End of Session - 03/03/22 1055     Visit Number 1    Number of Visits 10    Date for OT Re-Evaluation 04/11/22    Authorization Type [employer] $60 copay, 30 visits    Authorization - Number of Visits 30    OT Start Time 1058    OT Stop Time 1155    OT Time Calculation (min) 57 min    Equipment Utilized During Treatment orthotic materials    Activity Tolerance Patient tolerated treatment well;No increased pain;Patient limited by pain    Behavior During Therapy [facility] for tasks assessed/performed             Past Medical History:  Diagnosis Date   Allergy    Arthritis    CAD (coronary artery disease)    Coronary artery disease    GERD (gastroesophageal reflux disease)    occ   Heart murmur    baby   High cholesterol    Hypertension    Peripheral neuropathy    Pneumonia    Right carotid bruit 06/10/2017   Sciatica    Seasonal allergies    Sinus tachycardia    Spinal stenosis    Past Surgical History:  Procedure Laterality Date   CARDIAC CATHETERIZATION     CARPAL TUNNEL WITH CUBITAL TUNNEL Left 11/10/2013   Procedure: LEFT CARPAL TUNNEL RELEASE;  Surgeon: Cammie Sickle, MD;  Location: [place];  Service: Orthopedics;  Laterality: Left;   COLONOSCOPY  2013   at [facility], [place]-normal exam per pt   CORONARY ARTERY BYPASS GRAFT N/A 05/01/2020   Procedure: CORONARY ARTERY BYPASS GRAFTING (CABG) X 2 ON CARDIOPULMONARY BYPASS. LIMA TO LAD, SVG TO DIAG;  Surgeon: Ivin Poot, MD;  Location: [place];  Service: Open Heart Surgery;  Laterality: N/A;   ENDOVEIN HARVEST OF GREATER SAPHENOUS VEIN Right 05/01/2020   Procedure: ENDOVEIN HARVEST OF GREATER SAPHENOUS VEIN;  Surgeon: Ivin Poot, MD;  Location: [place];  Service: Open Heart  Surgery;  Laterality: Right;   KNEE ARTHROSCOPY  [id number]   left and right   LUMBAR LAMINECTOMY/DECOMPRESSION MICRODISCECTOMY Left 09/29/2017   Procedure: LEFT LUMBAR TWO - LUMBAR THREELAMINOTOMY/MICRODISCECTOMY;  Surgeon: Jovita Gamma, MD;  Location: [place];  Service: Neurosurgery;  Laterality: Left;  LEFT LUMBAR 2- LUMBAR 3 LAMINOTOMY/MICRODISCECTOMY   NASAL SINUS SURGERY  05/2015   RIGHT/LEFT HEART CATH AND CORONARY ANGIOGRAPHY N/A 04/10/2020   Procedure: RIGHT/LEFT HEART CATH AND CORONARY ANGIOGRAPHY;  Surgeon: Troy Sine, MD;  Location: [place] [facility];  Service: Cardiovascular;  Laterality: N/A;   TEE WITHOUT CARDIOVERSION N/A 05/01/2020   Procedure: TRANSESOPHAGEAL ECHOCARDIOGRAM (TEE);  Surgeon: Prescott Gum, Collier Salina, MD;  Location: [place];  Service: Open Heart Surgery;  Laterality: N/A;   TOTAL KNEE ARTHROPLASTY Right 06/24/2016   Procedure: TOTAL KNEE ARTHROPLASTY;  Surgeon: Garald Balding, MD;  Location: [place];  Service: Orthopedics;  Laterality: Right;   TOTAL KNEE ARTHROPLASTY Left 12/18/2020   Procedure: LEFT TOTAL KNEE ARTHROPLASTY;  Surgeon: Garald Balding, MD;  [facility];  Service: Orthopedics;  Laterality: Left;   TRIGGER FINGER RELEASE Left 11/10/2013   Procedure: LEFT INDEX A-1 PULLEY RELEASE;  Surgeon: Cammie Sickle, MD;  Location: [place];  Service: Orthopedics;  Laterality: Left;  ULNAR NERVE TRANSPOSITION Left 11/10/2013   Procedure: ULNAR NERVE TRANSPOSITION;  Surgeon: Cammie Sickle, MD;  Location: [place];  Service: Orthopedics;  Laterality: Left;   Patient Active Problem List   Diagnosis Date Noted   Chronic pain of left thumb 02/25/2022   SBE (subacute bacterial endocarditis) prophylaxis candidate 12/16/2021   Trigger finger, left ring finger 11/06/2021   Cervical radiculopathy 08/13/2021   Ulnar neuropathy 08/13/2021   Screen for colon cancer 06/06/2021   Chronic tachycardia 06/04/2021    Candidal balanitis 06/04/2021   Carpal tunnel syndrome, bilateral upper limbs 09/26/2020   Cubital tunnel syndrome on left 08/23/2020   Pulmonary nodule 1 cm or greater in diameter 04/26/2020   Statin intolerance 04/20/2020   Coronary atherosclerosis of native coronary artery 04/18/2020   Nonrheumatic aortic valve stenosis    Bicuspid aortic valve 04/06/2020   Primary osteoarthritis of left knee 11/08/2019   Bilateral primary osteoarthritis of knee 09/28/2019   HNP (herniated nucleus pulposus), lumbar 09/29/2017   Unilateral primary osteoarthritis, right knee 06/24/2016   Primary hypertension 01/18/2007    ONSET DATE: DOI 02/12/22  REFERRING DIAG:  W73.710G (ICD-10-CM) - Closed nondisplaced fracture of distal phalanx of left thumb, initial encounter  M65.342 (ICD-10-CM) - Trigger finger, left ring finger    THERAPY DIAG:  Pain in left wrist - Plan: Ot plan of care cert/re-cert  Localized edema - Plan: Ot plan of care cert/re-cert  Pain in left hand - Plan: Ot plan of care cert/re-cert  Other lack of coordination - Plan: Ot plan of care cert/re-cert  Stiffness of left wrist, not elsewhere classified - Plan: Ot plan of care cert/re-cert  Rationale for Evaluation and Treatment Rehabilitation  SUBJECTIVE:   SUBJECTIVE STATEMENT: He is a retired Development worker, 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  He is also thought to have pseudogout issue in Lt wrist/thumb basal joint. He states recent increasing pain but a desire to avoid surgeries. He had injection for triggering Lt RF recently and sx is scheduled by the end of the year for that. He states doing better since injection.  He also seems to have apparent Dupuytren contractures forming in Lt palm at digits 3,4 likely exacerbating issues.    PERTINENT HISTORY: Apparently, he was having thumb pain for several weeks,  when to his rheumatologist, who referred to ortho doctor who found him to have a fractured Lt thumb distal phalanx (T2). This injury was ~4 weeks ago. He also got injection for Lt RF trigger finger during that MD visit.  Per MD: "Eval and treat left thumb distal phalanx fracture.  Begin gentle ROM.  No strengthening."  PRECAUTIONS: Caution for low back pain, Lt thumb T2 fracture ~4 weeks ago.   WEIGHT BEARING RESTRICTIONS Yes NWB through Lt thumb/hand now   PAIN:  Are you having pain? Yes Rating: 1-2/10 at rest in Lt thumb, 6/10 in Lt wrist at rest  FALLS: Has patient fallen in last 6 months? Yes. Number of falls 1 (this accident)   LIVING ENVIRONMENT: Lives with: lives with their family   PLOF: Independent  PATIENT GOALS to have less pain in left arm, do more, and avoid surgeries if possible   OBJECTIVE:   HAND DOMINANCE: Right (but states fairly ambidextrous)   ADLs: Overall ADLs: States decreased ability to grab, hold household objects, pain and inability to  open containers, perform FMS tasks (manipulate fasteners on clothing), mild to moderate bathing problems as well.    FUNCTIONAL OUTCOME MEASURES: Eval: Quck DASH TBD in next session as  TBD% impairment today  (Higher % Score  =  More Impairment)     UPPER EXTREMITY ROM     Shoulder to Wrist AROM Right eval Left eval  Forearm supination    Forearm pronation     Wrist flexion 62 37  Wrist extension 56 37  Wrist ulnar deviation 37 29  Wrist radial deviation 23 3 (pain)  Functional dart thrower's motion (F-DTM) in ulnar flexion    F-DTM in radial extension     (Blank rows = not tested)   Hand AROM Right eval Left eval  Full Fist Ability (or Gap to Distal Palmar Crease) yes yes  Thumb Opposition to Small Finger (or Gap) can Can laterally only  Thumb Opposition to Base of Small Finger (or Gap)  3cm gap 5cm gap   Thumb MCP (0-60)  0- 54  Thumb IP (0-80)  0- 10  Thumb Radial abd/add (0-55)     Thumb Palmar  abd/add (0-45) 52 50   Index MCP (0-90)     Index PIP (0-100)     Index DIP (0-70)      Long MCP (0-90)      Long PIP (0-100)      Long DIP (0-70)      Ring MCP (0-90)   Full motion in RF today   Ring PIP (0-100)   Full motion in RF today   Ring DIP (0-70)   Full motion in RF today   Little MCP (0-90)      Little PIP (0-100)      Little DIP (0-70)      (Blank rows = not tested)   UPPER EXTREMITY MMT:    Eval:  NT at eval due to high pain and fx thumb   MMT Left TBD  Elbow flexion   Elbow extension   Forearm supination   Forearm pronation   Wrist flexion   Wrist extension   Wrist ulnar deviation   Wrist radial deviation   (Blank rows = not tested)  HAND FUNCTION: Eval: Observed weakness in affected hand, TBD when safe Grip strength Right: TBD lbs, Left: TBD lbs   COORDINATION: Eval: Observed coordination impairments with affected LT hand/wrist. Details TBD in upcoming sessions Box and Blocks Test: LEFT TBD Blocks today (TBD is [facility]); 9 Hole Peg Test Left: TBD sec   SENSATION: Eval:  Light touch intact today, though some complaints about paresthesia in the night in Lt hand  EDEMA:   Eval:  Mild-moderately swollen in Lt thumb DIP J and Lt wrist at base of thumb CMC J today  OBSERVATIONS:   Eval: noted early Dupuytren's contracture in Lt palm for digits 3, 4. No overt triggering today. Thumb nail black and IP J very swollen ~10* total motion. [place] J very arthritic and swollen at base and into wrist. Negative Finkelstein's test, mildly tender at DRUJ.  He does have probable pseudogout per rheumatology. DRUJ was tender with stability testing. Overt collapse / zig-zag deformity starting in Lt thumb > Rt thumb   TODAY'S TREATMENT:  Post-evaluation treatment: OT edu on body mechanics, structures related to trigger finger, CTS, and OA and thumb & wrist stability issues. These are all repetitive stress and habit based issues. He was edu to avoid repetitious use of hand/wrist,  prolonged poor postures.  OT also  fabricates custom Lt thumb tip protection orthotic that immobilizes the IP J. It fits well and he can don/doff himself. He is edu to wear at all times except hygiene and exercise. OT instructs him on gentle thumb opposition to base of SF AROM as well as very light flexion blocking AROM at IP J. To help manage thumb CMC J OA, OT also edu on CMC J abduction and opposition stretches, gently, which he tolerates with no pain. He performs all back well. He is edu to use ice for pain/swelling directly over orthotic. He states understanding all and is given written directions.    PATIENT EDUCATION: Education details: See tx section above for details  Person educated: Patient Education method: Verbal Instruction, Teach back, Handouts  Education comprehension: States and demonstrates understanding, Additional Education required    HOME EXERCISE PROGRAM: See tx section above for details    GOALS: Goals reviewed with patient? Yes   SHORT TERM GOALS: (STG required if POC>30 days) Target Date: 03/21/22  Pt will obtain protective, custom orthotic for thumb fx. Goal status: MET 03/03/22  2.  Pt will demo/state understanding of initial HEP to improve pain levels and prerequisite motion. Goal status: INITIAL   LONG TERM GOALS: Target Date: 04/11/22   Pt will improve functional ability by decreased impairment per Quick DASH assessment from TBD to TBD or better, for better quality of life. Goal status: INITIAL- will update in visit 2   2.  Pt will improve grip strength in Lt hand from unable to at least 40lbs for functional use at home and in IADLs. Goal status: INITIAL  3.  Pt will improve A/ROM in Lt thumb IP J flexion from about 10* to at least 40*, to have functional motion for tasks like reach and grasp.  Goal status: INITIAL  4.  Pt will improve strength in Lt wrist flex, ext from approx 4-/5 painful to at least 4+/5 MMT with no significant pain to have  increased functional ability to carry out selfcare and higher-level homecare tasks with no difficulty. Goal status: INITIAL  5.  Pt will improve coordination skills in Lt hand FMS, as seen by better score on 9HPT testing to have increased functional ability to carry out fine motor tasks (fasteners, etc.) and more complex, coordinated IADLs (meal prep, sports, etc.).  Goal status: INITIAL  6.  Pt will decrease pain at rest in Lt wrist from 6/10 to 2/10 or better to have better sleep and occupational participation in daily roles. Goal status: INITIAL    ASSESSMENT:  CLINICAL IMPRESSION: Patient is a 61 y.o. male who was seen today for occupational therapy evaluation for Lt thumb fx and was also found to have Lt wrist and thumb CMC pain, swelling, instability causing decreased function. He also needs edu for management of CTS, triggering fingers and recommendations for forming dupuytren's contracture. He will benefit from OP OT to increase quality of life and to improve on these physical impairments.   PERFORMANCE DEFICITS in functional skills including ADLs, IADLs, coordination, dexterity, edema, ROM, strength, pain, flexibility, FMC, GMC, body mechanics, endurance, decreased knowledge of precautions, and UE functional use, cognitive skills including problem solving and safety awareness, and psychosocial skills including coping strategies, environmental adaptation, and habits.   IMPAIRMENTS are limiting patient from ADLs, IADLs, rest and sleep, leisure, and social participation.   COMORBIDITIES has co-morbidities such as past nerve release surgeries (CuTS, CTR) on Lt side, Lt TFR, Rt TKA, chronic pain in low back, cardiac issues, HTN  and others  that affects occupational performance. Patient will benefit from skilled OT to address above impairments and improve overall function.  MODIFICATION OR ASSISTANCE TO COMPLETE EVALUATION: Min-Moderate modification of tasks or assist with assess necessary  to complete an evaluation.  OT OCCUPATIONAL PROFILE AND HISTORY: Detailed assessment: Review of records and additional review of physical, cognitive, psychosocial history related to current functional performance.  CLINICAL DECISION MAKING: Moderate - several treatment options, min-mod task modification necessary  REHAB POTENTIAL: Good  EVALUATION COMPLEXITY: Moderate      PLAN: OT FREQUENCY: 1-2x/week  OT DURATION: 6 weeks (through 04/11/22 as needed)   PLANNED INTERVENTIONS: self care/ADL training, therapeutic exercise, therapeutic activity, neuromuscular re-education, manual therapy, scar mobilization, passive range of motion, splinting, compression bandaging, moist heat, cryotherapy, contrast bath, patient/family education, coping strategies training, and DME and/or AE instructions  RECOMMENDED OTHER SERVICES: none now   CONSULTED AND AGREED WITH PLAN OF CARE: Patient  PLAN FOR NEXT SESSION:  Check thumb orthotic and initial HEP, provide wrist stretches and possibly wrist orthotic for painful wrist OA and "stable C" program for [facility] J OA management   Elianne Gubser, OTR/L, CHT 03/03/2022, 2:38 PM

## 2022-03-10 ENCOUNTER — Ambulatory Visit: Payer: BC Managed Care – PPO | Admitting: Rehabilitative and Restorative Service Providers"

## 2022-03-10 ENCOUNTER — Encounter: Payer: Self-pay | Admitting: Rehabilitative and Restorative Service Providers"

## 2022-03-10 DIAGNOSIS — M25532 Pain in left wrist: Secondary | ICD-10-CM

## 2022-03-10 DIAGNOSIS — M79642 Pain in left hand: Secondary | ICD-10-CM | POA: Diagnosis not present

## 2022-03-10 DIAGNOSIS — M25632 Stiffness of left wrist, not elsewhere classified: Secondary | ICD-10-CM | POA: Diagnosis not present

## 2022-03-10 DIAGNOSIS — R600 Localized edema: Secondary | ICD-10-CM

## 2022-03-10 DIAGNOSIS — R278 Other lack of coordination: Secondary | ICD-10-CM

## 2022-03-10 DIAGNOSIS — M6281 Muscle weakness (generalized): Secondary | ICD-10-CM

## 2022-03-10 NOTE — Therapy (Addendum)
OUTPATIENT OCCUPATIONAL THERAPY TREATMENT & DISCHARGE NOTE  Patient Name: Philip Winters MRN: [id number] DOB:May 30, 1960, 61 y.o., male Today's Date: 03/10/2022  PCP: Scarlette Calico, MD REFERRING PROVIDER: Leandrew Koyanagi, MD   OT End of Session - 03/10/22 1013     Visit Number 2    Number of Visits 10    Date for OT Re-Evaluation 04/11/22    Authorization Type [employer] $60 copay, 30 visits    Authorization - Number of Visits 30    OT Start Time 1014    OT Stop Time 1104    OT Time Calculation (min) 50 min    Equipment Utilized During Treatment orthotic materials    Activity Tolerance Patient tolerated treatment well;No increased pain;Patient limited by pain    Behavior During Therapy [facility] for tasks assessed/performed              Past Medical History:  Diagnosis Date   Allergy    Arthritis    CAD (coronary artery disease)    Coronary artery disease    GERD (gastroesophageal reflux disease)    occ   Heart murmur    baby   High cholesterol    Hypertension    Peripheral neuropathy    Pneumonia    Right carotid bruit 06/10/2017   Sciatica    Seasonal allergies    Sinus tachycardia    Spinal stenosis    Past Surgical History:  Procedure Laterality Date   CARDIAC CATHETERIZATION     CARPAL TUNNEL WITH CUBITAL TUNNEL Left 11/10/2013   Procedure: LEFT CARPAL TUNNEL RELEASE;  Surgeon: Cammie Sickle, MD;  Location: [place];  Service: Orthopedics;  Laterality: Left;   COLONOSCOPY  2013   at [facility], [place]-normal exam per pt   CORONARY ARTERY BYPASS GRAFT N/A 05/01/2020   Procedure: CORONARY ARTERY BYPASS GRAFTING (CABG) X 2 ON CARDIOPULMONARY BYPASS. LIMA TO LAD, SVG TO DIAG;  Surgeon: Ivin Poot, MD;  Location: [place];  Service: Open Heart Surgery;  Laterality: N/A;   ENDOVEIN HARVEST OF GREATER SAPHENOUS VEIN Right 05/01/2020   Procedure: ENDOVEIN HARVEST OF GREATER SAPHENOUS VEIN;  Surgeon: Ivin Poot, MD;  Location: [place];  Service: Open  Heart Surgery;  Laterality: Right;   KNEE ARTHROSCOPY  [id number]   left and right   LUMBAR LAMINECTOMY/DECOMPRESSION MICRODISCECTOMY Left 09/29/2017   Procedure: LEFT LUMBAR TWO - LUMBAR THREELAMINOTOMY/MICRODISCECTOMY;  Surgeon: Jovita Gamma, MD;  Location: [place];  Service: Neurosurgery;  Laterality: Left;  LEFT LUMBAR 2- LUMBAR 3 LAMINOTOMY/MICRODISCECTOMY   NASAL SINUS SURGERY  05/2015   RIGHT/LEFT HEART CATH AND CORONARY ANGIOGRAPHY N/A 04/10/2020   Procedure: RIGHT/LEFT HEART CATH AND CORONARY ANGIOGRAPHY;  Surgeon: Troy Sine, MD;  Location: [place] [facility];  Service: Cardiovascular;  Laterality: N/A;   TEE WITHOUT CARDIOVERSION N/A 05/01/2020   Procedure: TRANSESOPHAGEAL ECHOCARDIOGRAM (TEE);  Surgeon: Prescott Gum, Collier Salina, MD;  Location: [place];  Service: Open Heart Surgery;  Laterality: N/A;   TOTAL KNEE ARTHROPLASTY Right 06/24/2016   Procedure: TOTAL KNEE ARTHROPLASTY;  Surgeon: Garald Balding, MD;  Location: [place];  Service: Orthopedics;  Laterality: Right;   TOTAL KNEE ARTHROPLASTY Left 12/18/2020   Procedure: LEFT TOTAL KNEE ARTHROPLASTY;  Surgeon: Garald Balding, MD;  Location: [facility];  Service: Orthopedics;  Laterality: Left;   TRIGGER FINGER RELEASE Left 11/10/2013   Procedure: LEFT INDEX A-1 PULLEY RELEASE;  Surgeon: Cammie Sickle, MD;  Location: [place];  Service: Orthopedics;  Laterality: Left;   ULNAR NERVE TRANSPOSITION Left 11/10/2013   Procedure: ULNAR NERVE TRANSPOSITION;  Surgeon: Cammie Sickle, MD;  Location: [place];  Service: Orthopedics;  Laterality: Left;   Patient Active Problem List   Diagnosis Date Noted   Chronic pain of left thumb 02/25/2022   SBE (subacute bacterial endocarditis) prophylaxis candidate 12/16/2021   Trigger finger, left ring finger 11/06/2021   Cervical radiculopathy 08/13/2021   Ulnar neuropathy 08/13/2021   Screen for colon cancer 06/06/2021   Chronic tachycardia 06/04/2021    Candidal balanitis 06/04/2021   Carpal tunnel syndrome, bilateral upper limbs 09/26/2020   Cubital tunnel syndrome on left 08/23/2020   Pulmonary nodule 1 cm or greater in diameter 04/26/2020   Statin intolerance 04/20/2020   Coronary atherosclerosis of native coronary artery 04/18/2020   Nonrheumatic aortic valve stenosis    Bicuspid aortic valve 04/06/2020   Primary osteoarthritis of left knee 11/08/2019   Bilateral primary osteoarthritis of knee 09/28/2019   HNP (herniated nucleus pulposus), lumbar 09/29/2017   Unilateral primary osteoarthritis, right knee 06/24/2016   Primary hypertension 01/18/2007    ONSET DATE: DOI 02/12/22  REFERRING DIAG:  X44.818H (ICD-10-CM) - Closed nondisplaced fracture of distal phalanx of left thumb, initial encounter  M65.342 (ICD-10-CM) - Trigger finger, left ring finger    THERAPY DIAG:  Localized edema  Pain in left hand  Stiffness of left wrist, not elsewhere classified  Pain in left wrist  Muscle weakness (generalized)  Other lack of coordination  Rationale for Evaluation and Treatment Rehabilitation   PERTINENT HISTORY: Apparently, he was having thumb pain for several weeks, when to his rheumatologist, who referred to ortho doctor who found him to have a fractured Lt thumb distal phalanx (T2). This injury was ~4 weeks ago. He also got injection for Lt RF trigger finger during that MD visit.  Per MD: "Eval and treat left thumb distal phalanx fracture.  Begin gentle ROM.  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  He is also thought to have pseudogout issue in Lt wrist/thumb basal joint. He states recent increasing pain but a desire to avoid surgeries. He had injection for triggering Lt RF recently and sx is scheduled by the end of the year for that. He states doing  better since injection.  He also seems to have apparent Dupuytren contractures forming in Lt palm at digits 3,4 likely exacerbating issues. "  PRECAUTIONS: Caution for low back pain, Lt thumb T2 fracture ~5 weeks ago.   WEIGHT BEARING RESTRICTIONS Yes NWB through Lt thumb/hand now    SUBJECTIVE:   SUBJECTIVE STATEMENT: He states thumb feels well but wrist is aching a bit worse lately.     PAIN:  Are you having pain? Yes  Rating: 5-6/10 in Lt wrist at rest   OBJECTIVE:   HAND DOMINANCE: Right (but states fairly ambidextrous)   ADLs: Overall ADLs: States decreased ability to grab, hold household objects, pain and inability to open containers, perform FMS tasks (manipulate fasteners on clothing), mild to moderate bathing problems as well.    FUNCTIONAL OUTCOME MEASURES: 03/10/22: PSFS assessment: 2 today (open a jar, lift weights, cut up)    UPPER EXTREMITY ROM     Shoulder to Wrist AROM Right eval Left eval  Forearm supination    Forearm pronation  Wrist flexion 62 37  Wrist extension 56 37  Wrist ulnar deviation 37 29  Wrist radial deviation 23 3 (pain)  Functional dart thrower's motion (F-DTM) in ulnar flexion    F-DTM in radial extension     (Blank rows = not tested)   Hand AROM Right eval Left eval  Full Fist Ability (or Gap to Distal Palmar Crease) yes yes  Thumb Opposition to Small Finger (or Gap) can Can laterally only  Thumb Opposition to Base of Small Finger (or Gap)  3cm gap 5cm gap   Thumb MCP (0-60)  0- 54  Thumb IP (0-80)  0- 10  Thumb Radial abd/add (0-55)     Thumb Palmar abd/add (0-45) 52 50   Index MCP (0-90)     Index PIP (0-100)     Index DIP (0-70)      Long MCP (0-90)      Long PIP (0-100)      Long DIP (0-70)      Ring MCP (0-90)   Full motion in RF today   Ring PIP (0-100)   Full motion in RF today   Ring DIP (0-70)   Full motion in RF today   Little MCP (0-90)      Little PIP (0-100)      Little DIP (0-70)      (Blank rows  = not tested)   UPPER EXTREMITY MMT:    Eval:  NT at eval due to high pain and fx thumb   MMT Left TBD  Elbow flexion   Elbow extension   Forearm supination   Forearm pronation   Wrist flexion   Wrist extension   Wrist ulnar deviation   Wrist radial deviation   (Blank rows = not tested)  HAND FUNCTION: Eval: Observed weakness in affected hand, TBD when safe Grip strength Right: TBD lbs, Left: TBD lbs   COORDINATION: Eval: Observed coordination impairments with affected LT hand/wrist. Details TBD in upcoming sessions Box and Blocks Test: LEFT TBD Blocks today (TBD is [facility]); 9 Hole Peg Test Left: TBD sec   SENSATION: Eval:  Light touch intact today, though some complaints about paresthesia in the night in Lt hand  EDEMA:   Eval:  Mild-moderately swollen in Lt thumb DIP J and Lt wrist at base of thumb CMC J today  OBSERVATIONS:   Eval: noted early Dupuytren's contracture in Lt palm for digits 3, 4. No overt triggering today. Thumb nail black and IP J very swollen ~10* total motion. Butler J very arthritic and swollen at base and into wrist. Negative Finkelstein's test, mildly tender at DRUJ.  He does have probable pseudogout per rheumatology. DRUJ was tender with stability testing. Overt collapse / zig-zag deformity starting in Lt thumb > Rt thumb   TODAY'S TREATMENT:  03/10/22: Due to continued/increase thumb basal joint pain and wrist pain, OT fabricated custom wrist and thumb CMC J orthotic to rest OA hand.  It fits well, protects wrist. He was educated when to wear, when to take off, if needed, for safety- bring back for adjustments. He states understanding.  OT also edu on the following HEP to help manage wrist and thumb pain/tightness. OT does with him, then he performs back with no added pain.   Exercises - Seated Wrist Flexion Stretch  - 2-3 x daily - 3 reps - 15 hold - Wrist Extension Stretch Pronated  - 2-3 x daily - 3 reps - 15 hold - Stretch thumb downward   -  2-3 x  daily - 3-5 reps - 15 sec hold - Stretch Thumb into "C-Shape" (don't use other thumb)  - 2-3 x daily - 3-5 reps - 15 sec hold - Seated Thumb IP Flexion AROM with Blocking  - 4-6 x daily - 1 sets - 10-15 reps   PATIENT EDUCATION: Education details: See tx section above for details  Person educated: Patient Education method: Verbal Instruction, Teach back, Handouts  Education comprehension: States and demonstrates understanding, Additional Education required    HOME EXERCISE PROGRAM: Access Code: [email or web address] URL: [email or web address] Date: 03/10/2022 Prepared by: Benito Mccreedy     GOALS: Goals reviewed with patient? Yes   SHORT TERM GOALS: (STG required if POC>30 days) Target Date: 03/21/22  Pt will obtain protective, custom orthotic for thumb fx. Goal status: MET 03/03/22  2.  Pt will demo/state understanding of initial HEP to improve pain levels and prerequisite motion. Goal status: INITIAL   LONG TERM GOALS: Target Date: 04/11/22   Pt will improve functional ability by decreased impairment per PSFS assessment from 2 to 3 or better, for better quality of life (limited to a 3 due to abbreviated time for treatment due to back sx upcoming).  Goal status: INITIAL  2.  Pt will improve grip strength in Lt hand from unable to at least 40lbs for functional use at home and in IADLs. Goal status: INITIAL  3.  Pt will improve A/ROM in Lt thumb IP J flexion from about 10* to at least 40*, to have functional motion for tasks like reach and grasp.  Goal status: INITIAL  4.  Pt will improve strength in Lt wrist flex, ext from approx 4-/5 painful to at least 4+/5 MMT with no significant pain to have increased functional ability to carry out selfcare and higher-level homecare tasks with no difficulty. Goal status: INITIAL  5.  Pt will improve coordination skills in Lt hand FMS, as seen by better score on 9HPT testing to have increased functional ability to carry out fine  motor tasks (fasteners, etc.) and more complex, coordinated IADLs (meal prep, sports, etc.).  Goal status: INITIAL  6.  Pt will decrease pain at rest in Lt wrist from 6/10 to 2/10 or better to have better sleep and occupational participation in daily roles. Goal status: INITIAL    ASSESSMENT:  CLINICAL IMPRESSION: 03/10/22: He is going to have back surgery on 03/25/22 and will need to stop OP therapy.  This will limit goals, but as long as he has orthotics and HEP to improve issues, he will be fine.  Likely reassess and D/C next visit in prep for back sx.   Eval: Patient is a 61 y.o. male who was seen today for occupational therapy evaluation for Lt thumb fx and was also found to have Lt wrist and thumb CMC pain, swelling, instability causing decreased function. He also needs edu for management of CTS, triggering fingers and recommendations for forming dupuytren's contracture. He will benefit from OP OT to increase quality of life and to improve on these physical impairments.     PLAN: OT FREQUENCY: 1-2x/week  OT DURATION: 6 weeks (through 04/11/22 as needed)   PLANNED INTERVENTIONS: self care/ADL training, therapeutic exercise, therapeutic activity, neuromuscular re-education, manual therapy, scar mobilization, passive range of motion, splinting, compression bandaging, moist heat, cryotherapy, contrast bath, patient/family education, coping strategies training, and DME and/or AE instructions  RECOMMENDED OTHER SERVICES: none now   CONSULTED AND AGREED WITH PLAN OF CARE: Patient  PLAN FOR NEXT SESSION:  Likely reassess and D/C next visit in prep for back sx. Check new orthotic, start weaning thumb orthotic. Give out HEP to last as needed   Benito Mccreedy, OTR/L, CHT 03/10/2022, 12:28 PM    OCCUPATIONAL THERAPY DISCHARGE SUMMARY  He did not return to therapy or contact us for months, so OT will discharge now per policy.  Please see notes for details, goals could not be  addressed.   Benito Mccreedy, OTR/L, CHT 04/30/22

## 2022-03-12 ENCOUNTER — Other Ambulatory Visit (HOSPITAL_BASED_OUTPATIENT_CLINIC_OR_DEPARTMENT_OTHER): Payer: Self-pay

## 2022-03-12 NOTE — Pre-Procedure Instructions (Signed)
Surgical Instructions    Your procedure is scheduled on March 25, 2022.  Report to [facility] Main Entrance "A" at 6:30 A.M., then check in with the Admitting office.  Call this number if you have problems the morning of surgery:  [phone]   If you have any questions prior to your surgery date call [phone]: Open Monday-Friday 8am-4pm    Remember:  Do not eat or drink after midnight the night before your surgery      Take these medicines the morning of surgery with A SIP OF WATER:  diltiazem (CARDIZEM CD)  fluticasone (FLONASE)     Take these medicines the morning of surgery with a sip of water AS NEEDED:  acetaminophen (TYLENOL)   Polyethyl Glycol-Propyl Glycol (LUBRICANT EYE DROPS)   traMADol (ULTRAM)    Follow your surgeon's instructions on when to stop Aspirin.  If no instructions were given by your surgeon then you will need to call the office to get those instructions.     As of today, STOP taking any Aleve, Naproxen, Ibuprofen, Motrin, Advil, Goody's, BC's, all herbal medications, fish oil, and all vitamins.                     Do NOT Smoke (Tobacco/Vaping) for 24 hours prior to your procedure.  If you use a CPAP at night, you may bring your mask/headgear for your overnight stay.   Contacts, glasses, piercing's, hearing aid's, dentures or partials may not be worn into surgery, please bring cases for these belongings.    For patients admitted to the hospital, discharge time will be determined by your treatment team.   Patients discharged the day of surgery will not be allowed to drive home, and someone needs to stay with them for 24 hours.  SURGICAL WAITING ROOM VISITATION Patients having surgery or a procedure may have no more than 2 support people in the waiting area - these visitors may rotate.   Children under the age of 56 must have an adult with them who is not the patient. If the patient needs to stay at the hospital during part of their recovery,  the visitor guidelines for inpatient rooms apply. Pre-op nurse will coordinate an appropriate time for 1 support person to accompany patient in pre-op.  This support person may not rotate.   Please refer to the [facility] website for the visitor guidelines for Inpatients (after your surgery is over and you are in a regular room).    Special instructions:   [place]- Preparing For Surgery  Before surgery, you can play an important role. Because skin is not sterile, your skin needs to be as free of germs as possible. You can reduce the number of germs on your skin by washing with CHG (chlorahexidine gluconate) Soap before surgery.  CHG is an antiseptic cleaner which kills germs and bonds with the skin to continue killing germs even after washing.    Oral Hygiene is also important to reduce your risk of infection.  Remember - BRUSH YOUR TEETH THE MORNING OF SURGERY WITH YOUR REGULAR TOOTHPASTE  Please do not use if you have an allergy to CHG or antibacterial soaps. If your skin becomes reddened/irritated stop using the CHG.  Do not shave (including legs and underarms) for at least 48 hours prior to first CHG shower. It is OK to shave your face.  Please follow these instructions carefully.   Shower the NIGHT BEFORE SURGERY and the MORNING OF SURGERY  If you  chose to wash your hair, wash your hair first as usual with your normal shampoo.  After you shampoo, rinse your hair and body thoroughly to remove the shampoo.  Use CHG Soap as you would any other liquid soap. You can apply CHG directly to the skin and wash gently with a scrungie or a clean washcloth.   Apply the CHG Soap to your body ONLY FROM THE NECK DOWN.  Do not use on open wounds or open sores. Avoid contact with your eyes, ears, mouth and genitals (private parts). Wash Face and genitals (private parts)  with your normal soap.   Wash thoroughly, paying special attention to the area where your surgery will be  performed.  Thoroughly rinse your body with warm water from the neck down.  DO NOT shower/wash with your normal soap after using and rinsing off the CHG Soap.  Pat yourself dry with a CLEAN TOWEL.  Wear CLEAN PAJAMAS to bed the night before surgery  Place CLEAN SHEETS on your bed the night before your surgery  DO NOT SLEEP WITH PETS.   Day of Surgery: Take a shower with CHG soap. Do not wear jewelry or makeup Do not wear lotions, powders, perfumes/colognes, or deodorant. Do not shave 48 hours prior to surgery.  Men may shave face and neck. Do not bring valuables to the hospital.  [facility] is not responsible for any belongings or valuables. Do not wear nail polish, gel polish, artificial nails, or any other type of covering on natural nails (fingers and toes) If you have artificial nails or gel coating that need to be removed by a nail salon, please have this removed prior to surgery. Artificial nails or gel coating may interfere with anesthesia's ability to adequately monitor your vital signs. Wear Clean/Comfortable clothing the morning of surgery Remember to brush your teeth WITH YOUR REGULAR TOOTHPASTE.   Please read over the following fact sheets that you were given.    If you received a COVID test during your pre-op visit  it is requested that you wear a mask when out in public, stay away from anyone that may not be feeling well and notify your surgeon if you develop symptoms. If you have been in contact with anyone that has tested positive in the last 10 days please notify you surgeon.

## 2022-03-13 ENCOUNTER — Other Ambulatory Visit: Payer: Self-pay

## 2022-03-13 ENCOUNTER — Encounter (HOSPITAL_COMMUNITY): Payer: Self-pay

## 2022-03-13 ENCOUNTER — Encounter (HOSPITAL_COMMUNITY)
Admission: RE | Admit: 2022-03-13 | Discharge: 2022-03-13 | Disposition: A | Discharge status: Home / Self Care | Payer: BC Managed Care – PPO | Source: Ambulatory Visit | Attending: Neurological Surgery | Admitting: Neurological Surgery

## 2022-03-13 VITALS — BP 125/81 | HR 110 | Temp 98.0°F | Resp 18 | Ht 68.0 in | Wt 150.4 lb

## 2022-03-13 DIAGNOSIS — Z01818 Encounter for other preprocedural examination: Secondary | ICD-10-CM

## 2022-03-13 DIAGNOSIS — Z01812 Encounter for preprocedural laboratory examination: Secondary | ICD-10-CM | POA: Insufficient documentation

## 2022-03-13 DIAGNOSIS — I2510 Atherosclerotic heart disease of native coronary artery without angina pectoris: Secondary | ICD-10-CM | POA: Insufficient documentation

## 2022-03-13 LAB — SURGICAL PCR SCREEN
MRSA, PCR: NEGATIVE
Staphylococcus aureus: POSITIVE — AB

## 2022-03-13 LAB — BASIC METABOLIC PANEL
Anion gap: 9 (ref 5–15)
BUN: 14 mg/dL (ref 8–23)
CO2: 26 mmol/L (ref 22–32)
Calcium: 10 mg/dL (ref 8.9–10.3)
Chloride: 102 mmol/L (ref 98–111)
Creatinine, Ser: 0.68 mg/dL (ref 0.61–1.24)
GFR, Estimated: >60 mL/min (ref >60)
Glucose, Bld: 121 mg/dL — ABNORMAL HIGH (ref 70–99)
Potassium: 4.5 mmol/L (ref 3.5–5.1)
Sodium: 137 mmol/L (ref 135–145)

## 2022-03-13 LAB — TYPE AND SCREEN
ABO/RH(D): A NEG
Antibody Screen: NEGATIVE

## 2022-03-13 LAB — CBC
HCT: 43.6 % (ref 39.0–52.0)
Hemoglobin: 15.2 g/dL (ref 13.0–17.0)
MCH: 33.7 pg (ref 26.0–34.0)
MCHC: 34.9 g/dL (ref 30.0–36.0)
MCV: 96.7 fL (ref 80.0–100.0)
Platelets: 194 10*3/uL (ref 150–400)
RBC: 4.51 MIL/uL (ref 4.22–5.81)
RDW: 12.1 % (ref 11.5–15.5)
WBC: 5.6 10*3/uL (ref 4.0–10.5)
nRBC: 0 % (ref 0.0–0.2)

## 2022-03-13 NOTE — Progress Notes (Signed)
PCP - Dr. Scarlette Calico Cardiologist - Dr. Candee Furbish  PPM/ICD - Denies Device Orders - n/a Rep Notified - n/a  Chest x-ray - 10/16/2021 EKG - 09/04/2021 Stress Test - 04/05/2020 ECHO - 04/05/2020 Cardiac Cath - 04/10/2020  Sleep Study - Denies CPAP - n/a  No DM  Last dose of GLP1 agonist- n/a GLP1 instructions: n/a  Blood Thinner Instructions: n/a Aspirin Instructions: Pt will stop ASA 03/17/2022. His last dose of this medication will be 03/16/2022  NPO after midnight  COVID TEST- n/a   Anesthesia review: Yes. Cardiac Hx.   Patient denies shortness of breath, fever, cough and chest pain at PAT appointment   All instructions explained to the patient, with a verbal understanding of the material. Patient agrees to go over the instructions while at home for a better understanding. Patient also instructed to self quarantine after being tested for COVID-19. The opportunity to ask questions was provided.

## 2022-03-14 ENCOUNTER — Encounter (HOSPITAL_COMMUNITY): Payer: Self-pay

## 2022-03-14 NOTE — Anesthesia Preprocedure Evaluation (Addendum)
Anesthesia Evaluation  Patient identified by MRN, date of birth, ID band Patient awake    Reviewed: Allergy & Precautions, NPO status , Patient's Chart, lab work & pertinent test results  History of Anesthesia Complications Negative for: history of anesthetic complications  Airway Mallampati: II  TM Distance: >3 FB Neck ROM: Full    Dental  (+) Dental Advisory Given, Teeth Intact   Pulmonary former smoker   Pulmonary exam normal        Cardiovascular hypertension, Pt. on medications + CAD and + CABG  Normal cardiovascular exam+ Valvular Problems/Murmurs (bicuspid AV)      Neuro/Psych  Neuromuscular disease  negative psych ROS   GI/Hepatic Neg liver ROS,GERD  Controlled,,  Endo/Other  negative endocrine ROS    Renal/GU negative Renal ROS     Musculoskeletal  (+) Arthritis ,    Abdominal   Peds  Hematology negative hematology ROS (+)   Anesthesia Other Findings   Reproductive/Obstetrics                             Anesthesia Physical Anesthesia Plan  ASA: 3  Anesthesia Plan: General   Post-op Pain Management: Tylenol PO (pre-op)* and Celebrex PO (pre-op)*   Induction: Intravenous  PONV Risk Score and Plan: 2 and Treatment may vary due to age or medical condition, Ondansetron, Dexamethasone and Midazolam  Airway Management Planned: Oral ETT  Additional Equipment: None  Intra-op Plan:   Post-operative Plan: Extubation in OR  Informed Consent: I have reviewed the patients History and Physical, chart, labs and discussed the procedure including the risks, benefits and alternatives for the proposed anesthesia with the patient or authorized representative who has indicated his/her understanding and acceptance.     Dental advisory given  Plan Discussed with: CRNA and Anesthesiologist  Anesthesia Plan Comments: (See PAT notes )        Anesthesia Quick Evaluation

## 2022-03-14 NOTE — Progress Notes (Signed)
Anesthesia Chart Review:  Case: [id number] Date/Time: 03/25/22 0715   Procedure: MIS DECOMPRESSION AND TLIF, LT, L34, L45 (Left) - 3C   Anesthesia type: General   Pre-op diagnosis: LUMBAR FORAMINAL STENOSIS   Location: [facility] OR ROOM [facility] / [place] [facility]   Surgeons: Dawley, Theodoro Doing, DO       DISCUSSION: Patient is a 61 year old male scheduled for the above procedure.  History includes former smoker (quit 05/04/16), HTN, hypercholesterolemia, CAD (CABG: LIMA-LAD, SVG-DIAG 05/01/20), infant murmur (moderate AV calcification, tricuspid, mild AI, no AS 05/01/20 TEE), ascending thoracic aorta dilation (4.1 cm 09/2021 CT), sinus tachycardia, right carotid bruit (1-39% BICA stenosis 04/2020 [name]), GERD, peripheral neuropathy, osteoarthritis (right TKA 06/24/16; left TKA 12/18/20), spinal surgery (left L2-3 laminotomy/microdiscectomy 09/29/17).   Last cardiology visit with Dr. Marlou Porch was on 11/29/21.  No angina. Continue aggressive GDMT. He is on diltiazem for chronic tachycardia. He had put off back surgery a few months to work on improving his health  He had been working on his core at [employer] and walking. Now with progressive bilateral feet numbness. Dr. Marlou Porch noted the back surgery now scheduled.   He reported ASA dose is scheduled for 03/16/22.   Anesthesia team to evaluate on the day of surgery.    VS: BP 125/81   Pulse (!) 110   Temp 36.7 C (Oral)   Resp 18   Ht '5\' 8"'$  (1.727 m)   Wt 68.2 kg   SpO2 98%   BMI 22.87 kg/m    PROVIDERS: Janith Lima, MD his PCP Candee Furbish, MD is cardiologist Vernelle Emerald, MD is rheumatologist   LABS: Labs reviewed: Acceptable for surgery. (all labs ordered are listed, but only abnormal results are displayed)  Labs Reviewed  SURGICAL PCR SCREEN - Abnormal; Notable for the following components:      Result Value   Staphylococcus aureus POSITIVE (*)    All other components within normal limits  BASIC METABOLIC PANEL - Abnormal; Notable for the following  components:   Glucose, Bld 121 (*)    All other components within normal limits  CBC  TYPE AND SCREEN     IMAGES: CXR 10/16/2021 FINDINGS: No pneumothorax. No nodules or masses. No focal infiltrates. The cardiomediastinal silhouette is stable. IMPRESSION: No active cardiopulmonary disease  CT Chest (lung cancer screening) 10/03/2021 IMPRESSION: 1. Lung-RADS 2, benign appearance or behavior. Continue annual screening with low-dose chest CT without contrast in 12 months. 2. Aortic Atherosclerosis (ICD10-I70.0) and Emphysema (ICD10-J43.9). 3. Similar ascending aortic dilatation at on the order of 4.1 cm. Recommend annual imaging followup by CTA or MRA. This recommendation follows 2010 ACCF/AHA/AATS/ACR/ASA/SCA/SCAI/SIR/STS/SVM Guidelines for the Diagnosis and Management of Patients with Thoracic Aortic Disease. Circulation. 2010; 121: K354-S568. Aortic aneurysm NOS (ICD10-I71.9) 4. Aortic valvular calcifications. Consider echocardiography to evaluate for valvular dysfunction.  MRI L-spine 05/30/2021: Images in [facility]/PACS.   EKG: EKG 10/10/2021: By narrative, tracing showed: Normal sinus rhythm. LAE.  (No tracing viewable)   EKG 09/04/2021: Normal sinus rhythm Possible left atrial enlargement   CV: Intra-Op TEE (CABG) 05/01/2020 POST-OP IMPRESSIONS  Overall, there were no significant changes from pre-bypass.  - Aortic Valve: The aortic valve appears unchanged from pre-bypass.   PRE-OP FINDINGS  -  Left Ventricle: The left ventricle has normal systolic function, with an  ejection fraction of 55-60%. The cavity size was normal. There is no  increase in left ventricular wall thickness.  - Right Ventricle: The right ventricle has normal systolic function. The  cavity was normal.  There is no increase in right ventricular wall  thickness.  - Left Atrium: Left atrial size was normal in size. The left atrial  appendage is well visualized and there is no evidence of thrombus  present.  - Right Atrium: Right atrial size was normal in size.  - Interatrial Septum: No atrial level shunt detected by color flow Doppler.  - Pericardium: There is no evidence of pericardial effusion.  - Mitral Valve: The mitral valve is normal in structure. No thickening of  the mitral valve leaflet. No calcification of the mitral valve leaflet.  Mitral valve regurgitation is not visualized by color flow Doppler. There  Is no evidence of mitral stenosis.  - Tricuspid Valve: The tricuspid valve was normal in structure. Tricuspid  valve regurgitation is trivial by color flow Doppler.  - Aortic Valve: The aortic valve is tricuspid There is moderate thickening  of the aortic valve and There is moderate calcification of the aortic  valve Aortic valve regurgitation is mild by color flow Doppler. There is  no evidence of aortic valve stenosis.  - Pulmonic Valve: The pulmonic valve was normal in structure.  Pulmonic valve regurgitation is not visualized by color flow Doppler.  - Aorta: The aortic root and aortic arch are normal in size and structure.    US Carotid 04/30/2020 - Right Carotid: Velocities in the right ICA are consistent with a 1-39% stenosis.  - Left Carotid: Velocities in the left ICA are consistent with a 1-39% stenosis.  - Vertebrals:  Bilateral vertebral arteries demonstrate antegrade flow.  - Subclavians: Normal flow hemodynamics were seen in bilateral subclavian arteries.    Cardiac cath (PRE-CABG) 04/10/2020 Prox RCA lesion is 80% stenosed. Prox RCA to Mid RCA lesion is 80% stenosed. Non-stenotic 1st Diag-1 lesion. 1st Diag-2 lesion is 30% stenosed. Prox LAD to Mid LAD lesion is 40% stenosed with 40% stenosed side branch in 1st Diag. Ost LAD to Prox LAD lesion is 20% stenosed. Prox Cx lesion is 30% stenosed. - There is evidence for coronary calcification.   - The LAD has 20% proximal narrowing and then gives rise to a proximal diagonal vessel which has a focal  aneurysm extending from the ostium to the proximal diagonal segment. In the LAD, at the takeoff of this diagonal aneurysm,  there appears to be a 30 to 40% narrowing immediately proximal and distal to the diagonal vessel.  The diagonal vessel has a 30% narrowing and a sharp bend of the vessel immediately beyond  the distal portion of the aneurysm. - The circumflex vessel is a dominant vessel with proximal calcification and narrowing of 30% in the region of the first marginal branch. - The RCA is a small nondominant vessel that has proximal and mid 80% stenoses. - Calcified aortic valve with mild aortic valve stenosis. - Normal right heart pressures with no evidence for pulmonary hypertension. - S/p CABG: LIMG-LAD, SVG-DIAG 05/01/2020   Echo 04/05/2020  1. Left ventricular ejection fraction, by estimation, is 60 to 65%. The  left ventricle has normal function. The left ventricle has no regional  wall motion abnormalities. Left ventricular diastolic parameters are  consistent with Grade I diastolic  dysfunction (impaired relaxation).   2. Right ventricular systolic function is normal. The right ventricular  size is normal.   3. The mitral valve is normal in structure. No evidence of mitral valve  regurgitation.   4. The aortic valve is calcified. There is moderate calcification of the  aortic valve. There is moderate thickening  of the aortic valve. Aortic  valve regurgitation is not visualized. Mild aortic valve stenosis.   5. Aortic dilatation noted. There is mild to moderate dilatation of the  ascending aorta, measuring 40 mm.    Past Medical History:  Diagnosis Date   Allergy    Arthritis    CAD (coronary artery disease)    Coronary artery disease    GERD (gastroesophageal reflux disease)    occ   Heart murmur    baby   High cholesterol    Hypertension    Peripheral neuropathy    Pneumonia    Right carotid bruit 06/10/2017   Sciatica    Seasonal allergies    Sinus  tachycardia    Spinal stenosis     Past Surgical History:  Procedure Laterality Date   CARDIAC CATHETERIZATION     CARPAL TUNNEL WITH CUBITAL TUNNEL Left 11/10/2013   Procedure: LEFT CARPAL TUNNEL RELEASE;  Surgeon: Cammie Sickle, MD;  Location: [place];  Service: Orthopedics;  Laterality: Left;   COLONOSCOPY  2023   CORONARY ARTERY BYPASS GRAFT N/A 05/01/2020   Procedure: CORONARY ARTERY BYPASS GRAFTING (CABG) X 2 ON CARDIOPULMONARY BYPASS. LIMA TO LAD, SVG TO DIAG;  Surgeon: Ivin Poot, MD;  Location: [place];  Service: Open Heart Surgery;  Laterality: N/A;   ENDOVEIN HARVEST OF GREATER SAPHENOUS VEIN Right 05/01/2020   Procedure: ENDOVEIN HARVEST OF GREATER SAPHENOUS VEIN;  Surgeon: Ivin Poot, MD;  Location: [place];  Service: Open Heart Surgery;  Laterality: Right;   KNEE ARTHROSCOPY  [id number]   left and right   LUMBAR LAMINECTOMY/DECOMPRESSION MICRODISCECTOMY Left 09/29/2017   Procedure: LEFT LUMBAR TWO - LUMBAR THREELAMINOTOMY/MICRODISCECTOMY;  Surgeon: Jovita Gamma, MD;  Location: [place];  Service: Neurosurgery;  Laterality: Left;  LEFT LUMBAR 2- LUMBAR 3 LAMINOTOMY/MICRODISCECTOMY   NASAL SINUS SURGERY  05/2015   RIGHT/LEFT HEART CATH AND CORONARY ANGIOGRAPHY N/A 04/10/2020   Procedure: RIGHT/LEFT HEART CATH AND CORONARY ANGIOGRAPHY;  Surgeon: Troy Sine, MD;  Location: [place] [facility];  Service: Cardiovascular;  Laterality: N/A;   TEE WITHOUT CARDIOVERSION N/A 05/01/2020   Procedure: TRANSESOPHAGEAL ECHOCARDIOGRAM (TEE);  Surgeon: Prescott Gum, Collier Salina, MD;  Location: [place];  Service: Open Heart Surgery;  Laterality: N/A;   TOTAL KNEE ARTHROPLASTY Right 06/24/2016   Procedure: TOTAL KNEE ARTHROPLASTY;  Surgeon: Garald Balding, MD;  Location: [place];  Service: Orthopedics;  Laterality: Right;   TOTAL KNEE ARTHROPLASTY Left 12/18/2020   Procedure: LEFT TOTAL KNEE ARTHROPLASTY;  Surgeon: Garald Balding, MD;  [facility];  Service:  Orthopedics;  Laterality: Left;   TRIGGER FINGER RELEASE Left 11/10/2013   Procedure: LEFT INDEX A-1 PULLEY RELEASE;  Surgeon: Cammie Sickle, MD;  Location: [place];  Service: Orthopedics;  Laterality: Left;   ULNAR NERVE TRANSPOSITION Left 11/10/2013   Procedure: ULNAR NERVE TRANSPOSITION;  Surgeon: Cammie Sickle, MD;  Location: [place];  Service: Orthopedics;  Laterality: Left;    MEDICATIONS:  acetaminophen (TYLENOL) 500 MG tablet   Alirocumab (PRALUENT) 75 MG/ML SOAJ   Alirocumab (PRALUENT) 75 MG/ML SOAJ   aspirin EC 81 MG tablet   clobetasol cream (TEMOVATE) 0.05 %   colchicine 0.6 MG tablet   diltiazem (CARDIZEM CD) 240 MG 24 hr capsule   fluticasone (FLONASE) 50 MCG/ACT nasal spray   Multiple Vitamin (MULTIVITAMIN WITH MINERALS) TABS tablet   olmesartan (BENICAR) 20 MG tablet   pantoprazole (PROTONIX) 40 MG tablet  Polyethyl Glycol-Propyl Glycol (LUBRICANT EYE DROPS) 0.4-0.3 % SOLN   tamsulosin (FLOMAX) 0.4 MG CAPS capsule   tiZANidine (ZANAFLEX) 4 MG tablet   traMADol (ULTRAM) 50 MG tablet   No current facility-administered medications for this encounter.    Myra Gianotti, PA-C Surgical Short Stay/Anesthesiology [facility] Phone [phone] [facility] Phone [phone] 03/14/2022 5:27 PM

## 2022-03-19 ENCOUNTER — Ambulatory Visit: Payer: BC Managed Care – PPO | Admitting: Orthopaedic Surgery

## 2022-03-19 ENCOUNTER — Ambulatory Visit (INDEPENDENT_AMBULATORY_CARE_PROVIDER_SITE_OTHER): Payer: BC Managed Care – PPO

## 2022-03-19 DIAGNOSIS — M79645 Pain in left finger(s): Secondary | ICD-10-CM

## 2022-03-19 NOTE — Progress Notes (Signed)
Office Visit Note   Patient: Philip Winters           Date of Birth: [birth year]           MRN: [id number] Visit Date: 03/19/2022              Requested by: Janith Lima, MD [address] [place],  [place] [address] PCP: Janith Lima, MD   Assessment & Plan: Visit Diagnoses:  1. Pain of left thumb     Plan: Impression is 6 weeks status post left thumb distal phalanx fracture.  Clinically and radiographically he is improving.  He will continue with therapy to work on range of motion.  He will follow-up with [name] in 4 weeks for repeat evaluation and x-rays of the left thumb.  Call with concerns or questions in the meantime.  Follow-Up Instructions: Return in about 4 weeks (around 04/16/2022).   Orders:  Orders Placed This Encounter  Procedures   XR Finger Thumb Left   No orders of the defined types were placed in this encounter.     Procedures: No procedures performed   Clinical Data: No additional findings.   Subjective: Chief Complaint  Patient presents with   Left Thumb - Pain    HPI patient is a pleasant 61 year old gentleman who comes in today approximately 6 weeks status post comminuted fracture left thumb distal phalanx.  He has been doing well.  Minimal to no pain.  He has been in physical therapy.  He stopped wearing his splint this week.  Overall doing well.     Objective: Vital Signs: There were no vitals taken for this visit.    Ortho Exam left thumb exam reveals no tenderness to the fracture site.  He still has some slight swelling.  Limited range of motion of DIP joint.  Nailbed hematoma.  He is neurovascular intact distally.  Specialty Comments:  No specialty comments available.  Imaging: XR Finger Thumb Left  Result Date: 03/19/2022 X-rays demonstrate consolidation of the fracture site    PMFS History: Patient Active Problem List   Diagnosis Date Noted   Chronic pain of left thumb 02/25/2022   SBE (subacute bacterial endocarditis)  prophylaxis candidate 12/16/2021   Trigger finger, left ring finger 11/06/2021   Cervical radiculopathy 08/13/2021   Ulnar neuropathy 08/13/2021   Screen for colon cancer 06/06/2021   Chronic tachycardia 06/04/2021   Candidal balanitis 06/04/2021   Carpal tunnel syndrome, bilateral upper limbs 09/26/2020   Cubital tunnel syndrome on left 08/23/2020   Pulmonary nodule 1 cm or greater in diameter 04/26/2020   Statin intolerance 04/20/2020   Coronary atherosclerosis of native coronary artery 04/18/2020   Nonrheumatic aortic valve stenosis    Bicuspid aortic valve 04/06/2020   Primary osteoarthritis of left knee 11/08/2019   Bilateral primary osteoarthritis of knee 09/28/2019   HNP (herniated nucleus pulposus), lumbar 09/29/2017   Unilateral primary osteoarthritis, right knee 06/24/2016   Primary hypertension 01/18/2007   Past Medical History:  Diagnosis Date   Allergy    Arthritis    CAD (coronary artery disease)    GERD (gastroesophageal reflux disease)    occ   Heart murmur    baby   High cholesterol    Hypertension    Peripheral neuropathy    Pneumonia    Right carotid bruit 06/10/2017   Sciatica    Seasonal allergies    Sinus tachycardia    Spinal stenosis     Family History  Problem Relation  Age of Onset   Hypertension Mother    Throat cancer Mother    Hypertension Father    Cancer Father    Colon cancer Neg Hx    Esophageal cancer Neg Hx    Stomach cancer Neg Hx    Rectal cancer Neg Hx    Colon polyps Neg Hx     Past Surgical History:  Procedure Laterality Date   CARDIAC CATHETERIZATION     CARPAL TUNNEL WITH CUBITAL TUNNEL Left 11/10/2013   Procedure: LEFT CARPAL TUNNEL RELEASE;  Surgeon: Cammie Sickle, MD;  Location: [place];  Service: Orthopedics;  Laterality: Left;   COLONOSCOPY  2023   CORONARY ARTERY BYPASS GRAFT N/A 05/01/2020   Procedure: CORONARY ARTERY BYPASS GRAFTING (CABG) X 2 ON CARDIOPULMONARY BYPASS. LIMA TO LAD, SVG  TO DIAG;  Surgeon: Ivin Poot, MD;  Location: [place];  Service: Open Heart Surgery;  Laterality: N/A;   ENDOVEIN HARVEST OF GREATER SAPHENOUS VEIN Right 05/01/2020   Procedure: ENDOVEIN HARVEST OF GREATER SAPHENOUS VEIN;  Surgeon: Ivin Poot, MD;  Location: [place];  Service: Open Heart Surgery;  Laterality: Right;   KNEE ARTHROSCOPY  [id number]   left and right   LUMBAR LAMINECTOMY/DECOMPRESSION MICRODISCECTOMY Left 09/29/2017   Procedure: LEFT LUMBAR TWO - LUMBAR THREELAMINOTOMY/MICRODISCECTOMY;  Surgeon: Jovita Gamma, MD;  Location: [place];  Service: Neurosurgery;  Laterality: Left;  LEFT LUMBAR 2- LUMBAR 3 LAMINOTOMY/MICRODISCECTOMY   NASAL SINUS SURGERY  05/2015   RIGHT/LEFT HEART CATH AND CORONARY ANGIOGRAPHY N/A 04/10/2020   Procedure: RIGHT/LEFT HEART CATH AND CORONARY ANGIOGRAPHY;  Surgeon: Troy Sine, MD;  Location: [place] [facility];  Service: Cardiovascular;  Laterality: N/A;   TEE WITHOUT CARDIOVERSION N/A 05/01/2020   Procedure: TRANSESOPHAGEAL ECHOCARDIOGRAM (TEE);  Surgeon: Prescott Gum, Collier Salina, MD;  Location: [place];  Service: Open Heart Surgery;  Laterality: N/A;   TOTAL KNEE ARTHROPLASTY Right 06/24/2016   Procedure: TOTAL KNEE ARTHROPLASTY;  Surgeon: Garald Balding, MD;  Location: [place];  Service: Orthopedics;  Laterality: Right;   TOTAL KNEE ARTHROPLASTY Left 12/18/2020   Procedure: LEFT TOTAL KNEE ARTHROPLASTY;  Surgeon: Garald Balding, MD;  [facility];  Service: Orthopedics;  Laterality: Left;   TRIGGER FINGER RELEASE Left 11/10/2013   Procedure: LEFT INDEX A-1 PULLEY RELEASE;  Surgeon: Cammie Sickle, MD;  Location: [place];  Service: Orthopedics;  Laterality: Left;   ULNAR NERVE TRANSPOSITION Left 11/10/2013   Procedure: ULNAR NERVE TRANSPOSITION;  Surgeon: Cammie Sickle, MD;  Location: [place];  Service: Orthopedics;  Laterality: Left;   Social History   Occupational History   Occupation: Self  employed    Comment: plumber  Tobacco Use   Smoking status: Former    Packs/day: 1.00    Years: 37.00    Total pack years: 37.00    Types: Cigarettes    Quit date: 05/04/2016    Years since quitting: 5.8   Smokeless tobacco: Never  Vaping Use   Vaping Use: Former  Substance and Sexual Activity   Alcohol use: Yes    Alcohol/week: 5.0 standard drinks of alcohol    Types: 5 Glasses of wine per week    Comment: weekends only with dinner   Drug use: No   Sexual activity: Yes

## 2022-03-20 ENCOUNTER — Other Ambulatory Visit: Payer: Self-pay | Admitting: Cardiology

## 2022-03-20 ENCOUNTER — Other Ambulatory Visit (HOSPITAL_BASED_OUTPATIENT_CLINIC_OR_DEPARTMENT_OTHER): Payer: Self-pay

## 2022-03-20 MED FILL — Diltiazem HCl Coated Beads Cap ER 24HR 240 MG: ORAL | 90 days supply | Qty: 90 | Fill #0 | Status: AC

## 2022-03-21 ENCOUNTER — Other Ambulatory Visit (HOSPITAL_BASED_OUTPATIENT_CLINIC_OR_DEPARTMENT_OTHER): Payer: Self-pay

## 2022-03-26 ENCOUNTER — Ambulatory Visit: Payer: BC Managed Care – PPO | Admitting: Orthopaedic Surgery

## 2022-03-26 ENCOUNTER — Telehealth: Payer: Self-pay | Admitting: Internal Medicine

## 2022-03-26 NOTE — Telephone Encounter (Signed)
Patient called and said his wife tested positive for covid and as of today he tested positive as well. He's a little short of breath and his tempature was 99.6. He said is there anything specific he needs to do or just take mucinex or other OTC items. Call back is [phone]

## 2022-03-27 ENCOUNTER — Other Ambulatory Visit (HOSPITAL_BASED_OUTPATIENT_CLINIC_OR_DEPARTMENT_OTHER): Payer: Self-pay

## 2022-03-27 ENCOUNTER — Telehealth: Payer: BC Managed Care – PPO | Admitting: Internal Medicine

## 2022-03-27 ENCOUNTER — Telehealth (INDEPENDENT_AMBULATORY_CARE_PROVIDER_SITE_OTHER): Payer: BC Managed Care – PPO | Admitting: Nurse Practitioner

## 2022-03-27 VITALS — BP 127/85

## 2022-03-27 DIAGNOSIS — U071 COVID-19: Secondary | ICD-10-CM | POA: Insufficient documentation

## 2022-03-27 MED ORDER — MOLNUPIRAVIR EUA 200MG CAPSULE
4.0000 | ORAL_CAPSULE | Freq: Two times a day (BID) | ORAL | 0 refills | Status: AC
  Filled 2022-03-27 – 2022-04-02 (×2): qty 40, 5d supply, fill #0

## 2022-03-27 NOTE — Progress Notes (Signed)
   Established Patient Office Visit  An audio-only tele-health visit was completed today for this patient. I connected with  [name] on 03/27/22 utilizing audio-only technology and verified that I am speaking with the correct person using two identifiers. The patient was located at their home, and I was located at the office of [place] at [facility] during the encounter. I discussed the limitations of evaluation and management by telemedicine. The patient expressed understanding and agreed to proceed.     Subjective   Patient ID: SIGFREDO SCHREIER, male    DOB: 28-Nov-1960  Age: 61 y.o. MRN: [id number]  Chief Complaint  Patient presents with   Covid Positive   Tested positive for COVID-19 yesterday.  Symptoms started 3 days ago.  Current symptoms include congestion, sore throat, headache, myalgias, mild cough.  Patient is taking Tylenol and Mucinex as needed for symptom management.  Has not been vaccinated.  Main concern is his sore throat.    Review of Systems  Constitutional:  Positive for fever (99.6; resolved now).  HENT:  Positive for congestion and sore throat.   Respiratory:  Positive for cough. Negative for sputum production and shortness of breath.   Musculoskeletal:  Positive for myalgias.  Neurological:  Positive for headaches.      Objective:     BP 127/85    Physical Exam Comprehensive physical exam not completed today as office visit was conducted remotely.  Patient sounds well over the phone.  Patient was alert and oriented, and appeared to have appropriate judgment.   No results found for any visits on 03/27/22.    The ASCVD Risk score (Arnett DK, et al., 2019) failed to calculate for the following reasons:   The valid total cholesterol range is 130 to 320 mg/dL    Assessment & Plan:   Problem List Items Addressed This Visit       Other   COVID-19 - Primary    Acute, will prescribe molnupiravir to hopefully prevent severe illness due to  patient's history of being on diltiazem which can interact with Paxlovid.  Patient's symptoms currently remain mild and are slowly improving.  Educated patient that sore throat most likely is related to his acute infection with COVID-19, however if sore throat persists into next week he should call our office at which point he can be swabbed for strep.  Patient reports understanding.  Recommend supportive treatment with rest, fluid intake, walking once or twice a day as tolerated to promote lung expansion and blood circulation.  Patient reports understanding.  Educated that if symptoms worsen he should proceed to the hospital.  Patient educated on current isolation guidelines.      Relevant Medications   molnupiravir EUA (LAGEVRIO) 200 mg CAPS capsule    Return if symptoms worsen or fail to improve.  Total time of the telephone was 13 minutes and 33 seconds.   Ailene Ards, NP

## 2022-03-27 NOTE — Telephone Encounter (Signed)
Patient said he's not really feeling bad and wants to hold off for now and will call us back if he needs [name]

## 2022-03-27 NOTE — Assessment & Plan Note (Signed)
Acute, will prescribe molnupiravir to hopefully prevent severe illness due to patient's history of being on diltiazem which can interact with Paxlovid.  Patient's symptoms currently remain mild and are slowly improving.  Educated patient that sore throat most likely is related to his acute infection with COVID-19, however if sore throat persists into next week he should call our office at which point he can be swabbed for strep.  Patient reports understanding.  Recommend supportive treatment with rest, fluid intake, walking once or twice a day as tolerated to promote lung expansion and blood circulation.  Patient reports understanding.  Educated that if symptoms worsen he should proceed to the hospital.  Patient educated on current isolation guidelines.

## 2022-03-28 ENCOUNTER — Telehealth: Payer: Self-pay | Admitting: Cardiology

## 2022-03-28 NOTE — Telephone Encounter (Signed)
Pt c/o medication issue:  1. Name of Medication:   Alirocumab ([place]) 75 MG/ML SOAJ    2. How are you currently taking this medication (dosage and times per day)?   Inject 1 pen into the skin every 14 (fourteen) days.    3. Are you having a reaction (difficulty breathing--STAT)? no  4. What is your medication issue? Patient drop his injection. Calling to see what can be done. Please advise

## 2022-03-31 NOTE — Telephone Encounter (Signed)
Spoke with patient.  When he dropped pen on the ground it activated.  Offered a sample of Repatha to get him by until next Praluent due, but he reports prior myalgias with Repatha.  Assured him that he is fine to wait 2 weeks for next refill at pharmacy and resume at that time.

## 2022-04-01 ENCOUNTER — Other Ambulatory Visit (HOSPITAL_BASED_OUTPATIENT_CLINIC_OR_DEPARTMENT_OTHER): Payer: Self-pay

## 2022-04-02 ENCOUNTER — Other Ambulatory Visit (HOSPITAL_BASED_OUTPATIENT_CLINIC_OR_DEPARTMENT_OTHER): Payer: Self-pay

## 2022-04-03 ENCOUNTER — Other Ambulatory Visit (HOSPITAL_BASED_OUTPATIENT_CLINIC_OR_DEPARTMENT_OTHER): Payer: Self-pay

## 2022-04-09 ENCOUNTER — Ambulatory Visit: Payer: BC Managed Care – PPO | Admitting: Internal Medicine

## 2022-04-09 ENCOUNTER — Other Ambulatory Visit: Payer: Self-pay

## 2022-04-09 ENCOUNTER — Other Ambulatory Visit (HOSPITAL_BASED_OUTPATIENT_CLINIC_OR_DEPARTMENT_OTHER): Payer: Self-pay

## 2022-04-09 NOTE — Progress Notes (Signed)
Anesthesia APP Follow-up:   Case: [id number] Date/Time: 04/15/22 0845   Procedure: MIS DECOMPRESSION AND TLIF, LT, L34, L45 (Left) - 3C PATIENT TESTED COVID POSITIVE 03/26/22   Anesthesia type: General   Pre-op diagnosis: LUMBAR FORAMINAL STENOSIS   Location: [facility] OR ROOM [facility] / [place] [facility]   Surgeons: DawleyTheodoro Doing, DO       DISCUSSION: Patient is a 61 year old male scheduled for the above procedure. Surgery was initially scheduled for 03/25/22, but he and his wife developed sick symptoms ~ 03/24/22 and tested positive for COVID-19 on 03/26/22. He was prescribed molnupiravir. Surgery rescheduled for 04/15/22. Per 04/09/22 RN follow-up phone call, he feels well.   See my previously Anesthesia APP for Encounter 03/13/22.   Labs were drawn on 03/13/22, so will be just over 77 days old--CBC and BMET were normal other than glucose of 121 which was likely non-fasting. He will need an updated T&S, and otherwise CBC and BMET as felt indicated per anesthesiologist/surgeon.    Myra Gianotti, PA-C Surgical Short Stay/Anesthesiology [facility] Phone [phone] [facility] Phone [phone] 04/09/2022 11:10 AM

## 2022-04-09 NOTE — Progress Notes (Signed)
Pt called to notify of arrival time change. Surgery is 04/15/22 with arrival for 6:30AM. Pt states he has his instructions from his pre-op appointment. Pt reports that he feels well, no new medications that he is taking and no new medical diagnoses since his pre-op appointment. Pt instructed to call back with any further questions.

## 2022-04-14 NOTE — Progress Notes (Deleted)
Office Visit Note  Patient: Philip Winters             Date of Birth: 1960/12/06           MRN: [id number]             PCP: Janith Lima, MD Referring: Janith Lima, MD Visit Date: 04/15/2022   Subjective:  No chief complaint on file.   History of Present Illness: Philip Winters is a 61 y.o. male here for follow up for arthritis of multiple areas especially hands and knees concern for CPPD and/or osteoarthritis. Recommended starting colchcine 0.6 mg daily therapeutic trial. HE has also been scheduled for disc decompression procedure. ***   Previous HPI 01/28/22  Philip Winters is a 61 y.o. male here for follow up for evaluation of hand pain and swelling along with continues chronic left knee pain.  Evaluation at visit last year demonstrated osteoarthritis of multiple areas also questionable finding for CPPD or other seronegative inflammation.  He has been having pain fairly consistently in the hand worse around the thumb usually without a lot of swelling.  He has noticed flareup or increase of symptoms when he is excess icing or carrying weights more than usual.  Has known carpal tunnel syndrome and is planned for release surgery later this year also for treatment of his trigger finger.     Previous HPI 09/20/2020 Philip Winters is a 61 y.o. male here for evaluation of hand pain and swelling.  He has a history of bilateral knee arthritis with previous replacement of the right knee chronic osteoarthritis pain in bilateral hands and has had previous surgery for left carpal tunnel and cubital tunnel syndrome years ago.  Current problems got worse after he underwent coronary artery bypass grafting for coronary disease diagnosed after he was noticing increased exertional dyspnea.  Since then he has been having a lot of increased pain in his bilateral arms he has had advanced imaging of bilateral shoulders showing tendinitis and bursitis of the left shoulder more recently showing a labral tear and partial  thickness rotator cuff tendon tear of the right shoulder.  Steroid injections have helped in the left shoulder symptoms but right shoulder is still extremely painful.  Additionally he is noticing swelling occurring in both hands sometimes around the thumb joint and sometimes in the entire hand.  He does not feel like the symptoms are similar to his previous carpal tunnel syndrome although recent neurologic testing was indicative for some sensory and motor neuron loss favoring recurrence over residual past injury.    Labs reviewed 07/2020 ANA neg RF neg Uric acid 3.7 ESR 80   No Rheumatology ROS completed.   PMFS History:  Patient Active Problem List   Diagnosis Date Noted   COVID-19 03/27/2022   Chronic pain of left thumb 02/25/2022   SBE (subacute bacterial endocarditis) prophylaxis candidate 12/16/2021   Trigger finger, left ring finger 11/06/2021   Cervical radiculopathy 08/13/2021   Ulnar neuropathy 08/13/2021   Screen for colon cancer 06/06/2021   Chronic tachycardia 06/04/2021   Candidal balanitis 06/04/2021   Carpal tunnel syndrome, bilateral upper limbs 09/26/2020   Cubital tunnel syndrome on left 08/23/2020   Pulmonary nodule 1 cm or greater in diameter 04/26/2020   Statin intolerance 04/20/2020   Coronary atherosclerosis of native coronary artery 04/18/2020   Nonrheumatic aortic valve stenosis    Bicuspid aortic valve 04/06/2020   Primary osteoarthritis of left knee 11/08/2019   Bilateral primary  osteoarthritis of knee 09/28/2019   HNP (herniated nucleus pulposus), lumbar 09/29/2017   Unilateral primary osteoarthritis, right knee 06/24/2016   Primary hypertension 01/18/2007    Past Medical History:  Diagnosis Date   Allergy    Arthritis    CAD (coronary artery disease)    GERD (gastroesophageal reflux disease)    occ   Heart murmur    baby   High cholesterol    Hypertension    Peripheral neuropathy    Pneumonia    Right carotid bruit 06/10/2017   Sciatica     Seasonal allergies    Sinus tachycardia    Spinal stenosis     Family History  Problem Relation Winters of Onset   Hypertension Mother    Throat cancer Mother    Hypertension Father    Cancer Father    Colon cancer Neg Hx    Esophageal cancer Neg Hx    Stomach cancer Neg Hx    Rectal cancer Neg Hx    Colon polyps Neg Hx    Past Surgical History:  Procedure Laterality Date   CARDIAC CATHETERIZATION     CARPAL TUNNEL WITH CUBITAL TUNNEL Left 11/10/2013   Procedure: LEFT CARPAL TUNNEL RELEASE;  Surgeon: Cammie Sickle, MD;  Location: [place];  Service: Orthopedics;  Laterality: Left;   COLONOSCOPY  2023   CORONARY ARTERY BYPASS GRAFT N/A 05/01/2020   Procedure: CORONARY ARTERY BYPASS GRAFTING (CABG) X 2 ON CARDIOPULMONARY BYPASS. LIMA TO LAD, SVG TO DIAG;  Surgeon: Ivin Poot, MD;  Location: [place];  Service: Open Heart Surgery;  Laterality: N/A;   ENDOVEIN HARVEST OF GREATER SAPHENOUS VEIN Right 05/01/2020   Procedure: ENDOVEIN HARVEST OF GREATER SAPHENOUS VEIN;  Surgeon: Ivin Poot, MD;  Location: [place];  Service: Open Heart Surgery;  Laterality: Right;   KNEE ARTHROSCOPY  [id number]   left and right   LUMBAR LAMINECTOMY/DECOMPRESSION MICRODISCECTOMY Left 09/29/2017   Procedure: LEFT LUMBAR TWO - LUMBAR THREELAMINOTOMY/MICRODISCECTOMY;  Surgeon: Jovita Gamma, MD;  Location: [place];  Service: Neurosurgery;  Laterality: Left;  LEFT LUMBAR 2- LUMBAR 3 LAMINOTOMY/MICRODISCECTOMY   NASAL SINUS SURGERY  05/2015   RIGHT/LEFT HEART CATH AND CORONARY ANGIOGRAPHY N/A 04/10/2020   Procedure: RIGHT/LEFT HEART CATH AND CORONARY ANGIOGRAPHY;  Surgeon: Troy Sine, MD;  Location: [facility];  Service: Cardiovascular;  Laterality: N/A;   TEE WITHOUT CARDIOVERSION N/A 05/01/2020   Procedure: TRANSESOPHAGEAL ECHOCARDIOGRAM (TEE);  Surgeon: Prescott Gum, Collier Salina, MD;  Location: [place];  Service: Open Heart Surgery;  Laterality: N/A;   TOTAL KNEE ARTHROPLASTY  Right 06/24/2016   Procedure: TOTAL KNEE ARTHROPLASTY;  Surgeon: Garald Balding, MD;  Location: [place];  Service: Orthopedics;  Laterality: Right;   TOTAL KNEE ARTHROPLASTY Left 12/18/2020   Procedure: LEFT TOTAL KNEE ARTHROPLASTY;  Surgeon: Garald Balding, MD;  Location: [facility];  Service: Orthopedics;  Laterality: Left;   TRIGGER FINGER RELEASE Left 11/10/2013   Procedure: LEFT INDEX A-1 PULLEY RELEASE;  Surgeon: Cammie Sickle, MD;  Location: [place];  Service: Orthopedics;  Laterality: Left;   ULNAR NERVE TRANSPOSITION Left 11/10/2013   Procedure: ULNAR NERVE TRANSPOSITION;  Surgeon: Cammie Sickle, MD;  Location: [place];  Service: Orthopedics;  Laterality: Left;   Social History   Social History Narrative   Lives with wife, [name]   Caffeine use: 1 Coffee daily   Right handed    Immunization History  Administered Date(s) Administered  Influenza Split 02/20/2012   Influenza,inj,Quad PF,6+ Mos 01/25/2013   Influenza-Unspecified 02/20/2022   Tdap 09/18/2016     Objective: Vital Signs: There were no vitals taken for this visit.   Physical Exam   Musculoskeletal Exam: ***  CDAI Exam: CDAI Score: -- Patient Global: --; Provider Global: -- Swollen: --; Tender: -- Joint Exam 04/15/2022   No joint exam has been documented for this visit   There is currently no information documented on the homunculus. Go to the Rheumatology activity and complete the homunculus joint exam.  Investigation: No additional findings.  Imaging: XR Finger Thumb Left  Result Date: 03/19/2022 X-rays demonstrate consolidation of the fracture site   Recent Labs: Lab Results  Component Value Date   WBC 5.6 03/13/2022   HGB 15.2 03/13/2022   PLT 194 03/13/2022   NA 137 03/13/2022   K 4.5 03/13/2022   CL 102 03/13/2022   CO2 26 03/13/2022   GLUCOSE 121 (H) 03/13/2022   BUN 14 03/13/2022   CREATININE 0.68 03/13/2022   BILITOT 0.5  10/10/2021   ALKPHOS 169 (H) 10/10/2021   AST 31 10/10/2021   ALT 31 10/10/2021   PROT 6.5 10/10/2021   ALBUMIN 4.2 10/10/2021   CALCIUM 10.0 03/13/2022   GFRAA 115 04/06/2020    Speciality Comments: No specialty comments available.  Procedures:  No procedures performed Allergies: Mushroom extract complex, Penicillins, Chantix [varenicline], Crestor [rosuvastatin], Dilaudid [hydromorphone], Lipitor [atorvastatin], Losartan potassium, Lovaza [omega-3-acid ethyl esters], Oxycodone hcl, Pravachol [pravastatin], Bystolic [nebivolol hcl], Codeine, Iodine, Iohexol, Lopressor [metoprolol], Red dye, and Repatha [evolocumab]   Assessment / Plan:     Visit Diagnoses: No diagnosis found.  ***  Orders: No orders of the defined types were placed in this encounter.  No orders of the defined types were placed in this encounter.    Follow-Up Instructions: No follow-ups on file.   Collier Salina, MD  Note - This record has been created using [employer].  Chart creation errors have been sought, but may not always  have been located. Such creation errors do not reflect on  the standard of medical care.

## 2022-04-15 ENCOUNTER — Ambulatory Visit (HOSPITAL_COMMUNITY): Payer: BC Managed Care – PPO | Admitting: Vascular Surgery

## 2022-04-15 ENCOUNTER — Ambulatory Visit (HOSPITAL_COMMUNITY)
Admission: RE | Disposition: A | Discharge status: Home / Self Care | Payer: Self-pay | Source: Home / Self Care | Attending: Neurological Surgery

## 2022-04-15 ENCOUNTER — Ambulatory Visit (HOSPITAL_COMMUNITY): Payer: BC Managed Care – PPO

## 2022-04-15 ENCOUNTER — Ambulatory Visit (HOSPITAL_COMMUNITY): Payer: BC Managed Care – PPO | Admitting: Physician Assistant

## 2022-04-15 ENCOUNTER — Other Ambulatory Visit: Payer: Self-pay

## 2022-04-15 ENCOUNTER — Encounter (HOSPITAL_COMMUNITY): Payer: Self-pay | Admitting: Neurological Surgery

## 2022-04-15 ENCOUNTER — Ambulatory Visit: Payer: BC Managed Care – PPO | Admitting: Internal Medicine

## 2022-04-15 ENCOUNTER — Observation Stay (HOSPITAL_COMMUNITY)
Admission: RE | Admit: 2022-04-15 | Discharge: 2022-04-16 | Disposition: A | Discharge status: Home / Self Care | Payer: BC Managed Care – PPO | Attending: Neurological Surgery | Admitting: Neurological Surgery

## 2022-04-15 DIAGNOSIS — M48061 Spinal stenosis, lumbar region without neurogenic claudication: Principal | ICD-10-CM | POA: Diagnosis present

## 2022-04-15 DIAGNOSIS — M4726 Other spondylosis with radiculopathy, lumbar region: Secondary | ICD-10-CM | POA: Insufficient documentation

## 2022-04-15 DIAGNOSIS — Z951 Presence of aortocoronary bypass graft: Secondary | ICD-10-CM | POA: Insufficient documentation

## 2022-04-15 DIAGNOSIS — I10 Essential (primary) hypertension: Secondary | ICD-10-CM | POA: Insufficient documentation

## 2022-04-15 DIAGNOSIS — Z96653 Presence of artificial knee joint, bilateral: Secondary | ICD-10-CM | POA: Insufficient documentation

## 2022-04-15 DIAGNOSIS — I2510 Atherosclerotic heart disease of native coronary artery without angina pectoris: Secondary | ICD-10-CM | POA: Diagnosis not present

## 2022-04-15 DIAGNOSIS — Z7982 Long term (current) use of aspirin: Secondary | ICD-10-CM | POA: Insufficient documentation

## 2022-04-15 DIAGNOSIS — Z87891 Personal history of nicotine dependence: Secondary | ICD-10-CM | POA: Insufficient documentation

## 2022-04-15 DIAGNOSIS — M4326 Fusion of spine, lumbar region: Secondary | ICD-10-CM | POA: Diagnosis not present

## 2022-04-15 DIAGNOSIS — Z7951 Long term (current) use of inhaled steroids: Secondary | ICD-10-CM | POA: Insufficient documentation

## 2022-04-15 HISTORY — PX: TRANSFORAMINAL LUMBAR INTERBODY FUSION W/ MIS 2 LEVEL: SHX6146

## 2022-04-15 LAB — SURGICAL PCR SCREEN
MRSA, PCR: NEGATIVE
Staphylococcus aureus: POSITIVE — AB

## 2022-04-15 LAB — TYPE AND SCREEN
ABO/RH(D): A NEG
Antibody Screen: NEGATIVE

## 2022-04-15 SURGERY — MINIMALLY INVASIVE (MIS) TRANSFORAMINAL LUMBAR INTERBODY FUSION (TLIF) 2 LEVEL
Anesthesia: General | Laterality: Left

## 2022-04-15 MED ORDER — SODIUM CHLORIDE 0.9% FLUSH: 3.0000 mL | INTRAVENOUS | Status: DC | PRN

## 2022-04-15 MED ORDER — ACETAMINOPHEN 500 MG PO TABS
1000.0000 mg | ORAL_TABLET | Freq: Once | ORAL | Status: AC
  Administered 2022-04-15: 1000 mg via ORAL
  Filled 2022-04-15: qty 2

## 2022-04-15 MED ORDER — VANCOMYCIN HCL IN DEXTROSE 1-5 GM/200ML-% IV SOLN
1000.0000 mg | Freq: Once | INTRAVENOUS | Status: AC
  Administered 2022-04-15: 1000 mg via INTRAVENOUS
  Filled 2022-04-15: qty 200

## 2022-04-15 MED ORDER — HEMOSTATIC AGENTS (NO CHARGE) OPTIME
TOPICAL | Status: DC | PRN
  Administered 2022-04-15: 1 via TOPICAL

## 2022-04-15 MED ORDER — MENTHOL 3 MG MT LOZG: 1.0000 | LOZENGE | OROMUCOSAL | Status: DC | PRN

## 2022-04-15 MED ORDER — KETOROLAC TROMETHAMINE 15 MG/ML IJ SOLN
INTRAMUSCULAR | Status: AC
  Filled 2022-04-15: qty 1

## 2022-04-15 MED ORDER — DOCUSATE SODIUM 100 MG PO CAPS
100.0000 mg | ORAL_CAPSULE | Freq: Two times a day (BID) | ORAL | Status: DC
  Administered 2022-04-15: 100 mg via ORAL
  Filled 2022-04-15: qty 1

## 2022-04-15 MED ORDER — ACETAMINOPHEN 650 MG RE SUPP: 650.0000 mg | RECTAL | Status: DC | PRN

## 2022-04-15 MED ORDER — ACETAMINOPHEN 325 MG PO TABS
650.0000 mg | ORAL_TABLET | ORAL | Status: DC | PRN
  Administered 2022-04-16 (×2): 650 mg via ORAL
  Filled 2022-04-15 (×2): qty 2

## 2022-04-15 MED ORDER — LACTATED RINGERS IV SOLN: INTRAVENOUS | Status: DC | PRN

## 2022-04-15 MED ORDER — FENTANYL CITRATE (PF) 100 MCG/2ML IJ SOLN
INTRAMUSCULAR | Status: AC
  Filled 2022-04-15: qty 2

## 2022-04-15 MED ORDER — PHENOL 1.4 % MT LIQD: 1.0000 | OROMUCOSAL | Status: DC | PRN

## 2022-04-15 MED ORDER — THROMBIN 5000 UNITS EX SOLR
CUTANEOUS | Status: AC
  Filled 2022-04-15: qty 5000

## 2022-04-15 MED ORDER — ROCURONIUM BROMIDE 10 MG/ML (PF) SYRINGE
PREFILLED_SYRINGE | INTRAVENOUS | Status: DC | PRN
  Administered 2022-04-15 (×2): 50 mg via INTRAVENOUS
  Administered 2022-04-15: 30 mg via INTRAVENOUS
  Administered 2022-04-15: 40 mg via INTRAVENOUS
  Administered 2022-04-15: 30 mg via INTRAVENOUS

## 2022-04-15 MED ORDER — ROCURONIUM BROMIDE 10 MG/ML (PF) SYRINGE
PREFILLED_SYRINGE | INTRAVENOUS | Status: AC
  Filled 2022-04-15: qty 20

## 2022-04-15 MED ORDER — LACTATED RINGERS IV SOLN: INTRAVENOUS | Status: DC

## 2022-04-15 MED ORDER — CHLORHEXIDINE GLUCONATE CLOTH 2 % EX PADS: 6.0000 | MEDICATED_PAD | Freq: Once | CUTANEOUS | Status: DC

## 2022-04-15 MED ORDER — 0.9 % SODIUM CHLORIDE (POUR BTL) OPTIME
TOPICAL | Status: DC | PRN
  Administered 2022-04-15: 1000 mL

## 2022-04-15 MED ORDER — MIDAZOLAM HCL 2 MG/2ML IJ SOLN
INTRAMUSCULAR | Status: AC
  Filled 2022-04-15: qty 2

## 2022-04-15 MED ORDER — FENTANYL CITRATE (PF) 100 MCG/2ML IJ SOLN
25.0000 ug | INTRAMUSCULAR | Status: DC | PRN
  Administered 2022-04-15 (×3): 50 ug via INTRAVENOUS

## 2022-04-15 MED ORDER — KETOROLAC TROMETHAMINE 15 MG/ML IJ SOLN
7.5000 mg | Freq: Four times a day (QID) | INTRAMUSCULAR | Status: AC
  Administered 2022-04-15 – 2022-04-16 (×4): 7.5 mg via INTRAVENOUS
  Filled 2022-04-15 (×3): qty 1

## 2022-04-15 MED ORDER — BUPIVACAINE LIPOSOME 1.3 % IJ SUSP
INTRAMUSCULAR | Status: AC
  Filled 2022-04-15: qty 20

## 2022-04-15 MED ORDER — PHENYLEPHRINE HCL-NACL 20-0.9 MG/250ML-% IV SOLN
INTRAVENOUS | Status: DC | PRN
  Administered 2022-04-15: 25 ug/min via INTRAVENOUS

## 2022-04-15 MED ORDER — DEXAMETHASONE SODIUM PHOSPHATE 4 MG/ML IJ SOLN
4.0000 mg | Freq: Four times a day (QID) | INTRAMUSCULAR | Status: DC
  Administered 2022-04-15 – 2022-04-16 (×2): 4 mg via INTRAVENOUS
  Filled 2022-04-15 (×2): qty 1

## 2022-04-15 MED ORDER — DEXAMETHASONE SODIUM PHOSPHATE 10 MG/ML IJ SOLN
INTRAMUSCULAR | Status: DC | PRN
  Administered 2022-04-15: 10 mg via INTRAVENOUS

## 2022-04-15 MED ORDER — ROCURONIUM BROMIDE 10 MG/ML (PF) SYRINGE
PREFILLED_SYRINGE | INTRAVENOUS | Status: AC
  Filled 2022-04-15: qty 10

## 2022-04-15 MED ORDER — CHLORHEXIDINE GLUCONATE CLOTH 2 % EX PADS: 6.0000 | MEDICATED_PAD | Freq: Every day | CUTANEOUS | Status: DC

## 2022-04-15 MED ORDER — PROPOFOL 10 MG/ML IV BOLUS
INTRAVENOUS | Status: AC
  Filled 2022-04-15: qty 20

## 2022-04-15 MED ORDER — LIDOCAINE 2% (20 MG/ML) 5 ML SYRINGE
INTRAMUSCULAR | Status: DC | PRN
  Administered 2022-04-15: 60 mg via INTRAVENOUS

## 2022-04-15 MED ORDER — MIDAZOLAM HCL 2 MG/2ML IJ SOLN
INTRAMUSCULAR | Status: DC | PRN
  Administered 2022-04-15: 2 mg via INTRAVENOUS

## 2022-04-15 MED ORDER — METOCLOPRAMIDE HCL 5 MG/ML IJ SOLN: 10.0000 mg | INTRAMUSCULAR | Status: DC | PRN

## 2022-04-15 MED ORDER — BUPIVACAINE-EPINEPHRINE (PF) 0.5% -1:200000 IJ SOLN
INTRAMUSCULAR | Status: AC
  Filled 2022-04-15: qty 30

## 2022-04-15 MED ORDER — DILTIAZEM HCL ER COATED BEADS 120 MG PO CP24: 240.0000 mg | ORAL_CAPSULE | Freq: Every day | ORAL | Status: DC

## 2022-04-15 MED ORDER — CHLORHEXIDINE GLUCONATE 0.12 % MT SOLN
15.0000 mL | Freq: Once | OROMUCOSAL | Status: AC
  Administered 2022-04-15: 15 mL via OROMUCOSAL
  Filled 2022-04-15: qty 15

## 2022-04-15 MED ORDER — METHOCARBAMOL 500 MG PO TABS
ORAL_TABLET | ORAL | Status: AC
  Filled 2022-04-15: qty 1

## 2022-04-15 MED ORDER — PROMETHAZINE HCL 25 MG/ML IJ SOLN: 6.2500 mg | INTRAMUSCULAR | Status: DC | PRN

## 2022-04-15 MED ORDER — FENTANYL CITRATE (PF) 250 MCG/5ML IJ SOLN
INTRAMUSCULAR | Status: AC
  Filled 2022-04-15: qty 5

## 2022-04-15 MED ORDER — BUPIVACAINE LIPOSOME 1.3 % IJ SUSP
INTRAMUSCULAR | Status: DC | PRN
  Administered 2022-04-15: 20 mL

## 2022-04-15 MED ORDER — SUGAMMADEX SODIUM 200 MG/2ML IV SOLN
INTRAVENOUS | Status: DC | PRN
  Administered 2022-04-15: 200 mg via INTRAVENOUS

## 2022-04-15 MED ORDER — LIDOCAINE-EPINEPHRINE 1 %-1:100000 IJ SOLN
INTRAMUSCULAR | Status: AC
  Filled 2022-04-15: qty 1

## 2022-04-15 MED ORDER — OXYCODONE HCL 5 MG PO TABS
5.0000 mg | ORAL_TABLET | ORAL | Status: DC | PRN
  Administered 2022-04-15 – 2022-04-16 (×3): 5 mg via ORAL
  Filled 2022-04-15 (×3): qty 1

## 2022-04-15 MED ORDER — PROPOFOL 10 MG/ML IV BOLUS
INTRAVENOUS | Status: DC | PRN
  Administered 2022-04-15: 160 mg via INTRAVENOUS

## 2022-04-15 MED ORDER — ONDANSETRON HCL 4 MG PO TABS: 4.0000 mg | ORAL_TABLET | Freq: Four times a day (QID) | ORAL | Status: DC | PRN

## 2022-04-15 MED ORDER — ONDANSETRON HCL 4 MG/2ML IJ SOLN
INTRAMUSCULAR | Status: AC
  Filled 2022-04-15: qty 2

## 2022-04-15 MED ORDER — METHOCARBAMOL 500 MG PO TABS
500.0000 mg | ORAL_TABLET | Freq: Four times a day (QID) | ORAL | Status: DC | PRN
  Administered 2022-04-15 – 2022-04-16 (×3): 500 mg via ORAL
  Filled 2022-04-15 (×2): qty 1

## 2022-04-15 MED ORDER — MUPIROCIN 2 % EX OINT
1.0000 | TOPICAL_OINTMENT | Freq: Two times a day (BID) | CUTANEOUS | Status: DC
  Administered 2022-04-15: 1 via NASAL
  Filled 2022-04-15: qty 22

## 2022-04-15 MED ORDER — SODIUM CHLORIDE 0.9 % IV SOLN
250.0000 mL | INTRAVENOUS | Status: DC
  Administered 2022-04-15: 250 mL via INTRAVENOUS

## 2022-04-15 MED ORDER — METHOCARBAMOL 1000 MG/10ML IJ SOLN
500.0000 mg | Freq: Four times a day (QID) | INTRAVENOUS | Status: DC | PRN
  Filled 2022-04-15: qty 5

## 2022-04-15 MED ORDER — IRBESARTAN 150 MG PO TABS: 150.0000 mg | ORAL_TABLET | Freq: Every day | ORAL | Status: DC

## 2022-04-15 MED ORDER — VANCOMYCIN HCL IN DEXTROSE 1-5 GM/200ML-% IV SOLN
1000.0000 mg | INTRAVENOUS | Status: AC
  Administered 2022-04-15: 1000 mg via INTRAVENOUS
  Filled 2022-04-15: qty 200

## 2022-04-15 MED ORDER — PHENYLEPHRINE HCL (PRESSORS) 10 MG/ML IV SOLN
INTRAVENOUS | Status: DC | PRN
  Administered 2022-04-15: 80 ug via INTRAVENOUS

## 2022-04-15 MED ORDER — ALBUMIN HUMAN 5 % IV SOLN: INTRAVENOUS | Status: DC | PRN

## 2022-04-15 MED ORDER — SODIUM CHLORIDE 0.9% FLUSH: 3.0000 mL | Freq: Two times a day (BID) | INTRAVENOUS | Status: DC

## 2022-04-15 MED ORDER — HYDROMORPHONE HCL 1 MG/ML IJ SOLN: 0.5000 mg | INTRAMUSCULAR | Status: DC | PRN

## 2022-04-15 MED ORDER — METOCLOPRAMIDE HCL 5 MG/ML IJ SOLN
10.0000 mg | Freq: Once | INTRAMUSCULAR | Status: AC
  Administered 2022-04-15: 10 mg via INTRAVENOUS
  Filled 2022-04-15: qty 2

## 2022-04-15 MED ORDER — ONDANSETRON HCL 4 MG/2ML IJ SOLN
INTRAMUSCULAR | Status: DC | PRN
  Administered 2022-04-15: 4 mg via INTRAVENOUS

## 2022-04-15 MED ORDER — ONDANSETRON HCL 4 MG/2ML IJ SOLN
4.0000 mg | Freq: Four times a day (QID) | INTRAMUSCULAR | Status: DC | PRN
  Administered 2022-04-15: 4 mg via INTRAVENOUS
  Filled 2022-04-15: qty 2

## 2022-04-15 MED ORDER — BUPIVACAINE-EPINEPHRINE (PF) 0.5% -1:200000 IJ SOLN
INTRAMUSCULAR | Status: DC | PRN
  Administered 2022-04-15: 8 mL via PERINEURAL

## 2022-04-15 MED ORDER — CELECOXIB 200 MG PO CAPS
200.0000 mg | ORAL_CAPSULE | Freq: Once | ORAL | Status: AC
  Administered 2022-04-15: 200 mg via ORAL
  Filled 2022-04-15: qty 1

## 2022-04-15 MED ORDER — FENTANYL CITRATE (PF) 250 MCG/5ML IJ SOLN
INTRAMUSCULAR | Status: DC | PRN
  Administered 2022-04-15 (×3): 50 ug via INTRAVENOUS
  Administered 2022-04-15: 100 ug via INTRAVENOUS

## 2022-04-15 MED ORDER — THROMBIN 5000 UNITS EX SOLR
OROMUCOSAL | Status: DC | PRN
  Administered 2022-04-15 (×2): 5 mL via TOPICAL

## 2022-04-15 MED ORDER — ORAL CARE MOUTH RINSE: 15.0000 mL | Freq: Once | OROMUCOSAL | Status: AC

## 2022-04-15 MED ORDER — DEXAMETHASONE SODIUM PHOSPHATE 10 MG/ML IJ SOLN
INTRAMUSCULAR | Status: AC
  Filled 2022-04-15: qty 1

## 2022-04-15 MED ORDER — DEXAMETHASONE SODIUM PHOSPHATE 4 MG/ML IJ SOLN
INTRAMUSCULAR | Status: AC
  Administered 2022-04-15: 4 mg
  Filled 2022-04-15: qty 1

## 2022-04-15 MED ORDER — LIDOCAINE-EPINEPHRINE 1 %-1:100000 IJ SOLN
INTRAMUSCULAR | Status: DC | PRN
  Administered 2022-04-15: 8 mL

## 2022-04-15 MED ORDER — LIDOCAINE 2% (20 MG/ML) 5 ML SYRINGE
INTRAMUSCULAR | Status: AC
  Filled 2022-04-15: qty 5

## 2022-04-15 MED ORDER — OXYCODONE HCL 5 MG PO TABS
10.0000 mg | ORAL_TABLET | ORAL | Status: DC | PRN
  Administered 2022-04-15: 10 mg via ORAL
  Filled 2022-04-15: qty 2

## 2022-04-15 MED ORDER — SUCCINYLCHOLINE CHLORIDE 200 MG/10ML IV SOSY
PREFILLED_SYRINGE | INTRAVENOUS | Status: AC
  Filled 2022-04-15: qty 10

## 2022-04-15 SURGICAL SUPPLY — 82 items
ADH SKN CLS APL DERMABOND .7 (GAUZE/BANDAGES/DRESSINGS) ×1
BAG COUNTER SPONGE SURGICOUNT (BAG) ×1 IMPLANT
BAG SPNG CNTER NS LX DISP (BAG) ×2
BAND INSRT 18 STRL LF DISP RB (MISCELLANEOUS) ×2
BAND RUBBER #18 3X1/16 STRL (MISCELLANEOUS) ×2 IMPLANT
BASKET BONE COLLECTION (BASKET) ×1 IMPLANT
BUR MATCHSTICK NEURO 3.0 LAGG (BURR) ×1 IMPLANT
CAGE CONVEX CASCADIA 8.5X28X14 (Cage) IMPLANT
CAGE POR CASCADIA 8.5X28X10 (Cage) IMPLANT
CNTNR URN SCR LID CUP LEK RST (MISCELLANEOUS) ×1 IMPLANT
CONT SPEC 4OZ STRL OR WHT (MISCELLANEOUS) ×1
COVER BACK TABLE 60X90IN (DRAPES) ×1 IMPLANT
COVER MAYO STAND STRL (DRAPES) ×2 IMPLANT
DERMABOND ADVANCED .7 DNX12 (GAUZE/BANDAGES/DRESSINGS) IMPLANT
DRAIN JACKSON RD 7FR 3/32 (WOUND CARE) IMPLANT
DRAPE C-ARM 42X72 X-RAY (DRAPES) ×2 IMPLANT
DRAPE C-ARMOR (DRAPES) IMPLANT
DRAPE LAPAROTOMY 100X72X124 (DRAPES) ×1 IMPLANT
DRAPE MICROSCOPE SLANT 54X150 (MISCELLANEOUS) ×1 IMPLANT
DRAPE STERI IOBAN 125X83 (DRAPES) IMPLANT
DRAPE SURG 17X23 STRL (DRAPES) ×1 IMPLANT
DRSG OPSITE POSTOP 4X8 (GAUZE/BANDAGES/DRESSINGS) IMPLANT
DURAPREP 26ML APPLICATOR (WOUND CARE) ×1 IMPLANT
ELECT BLADE INSULATED 6.5IN (ELECTROSURGICAL) ×1
ELECT REM PT RETURN 9FT ADLT (ELECTROSURGICAL) ×1
ELECTRODE BLDE INSULATED 6.5IN (ELECTROSURGICAL) ×1 IMPLANT
ELECTRODE REM PT RTRN 9FT ADLT (ELECTROSURGICAL) ×1 IMPLANT
GAUZE 4X4 16PLY ~~LOC~~+RFID DBL (SPONGE) IMPLANT
GAUZE SPONGE 4X4 12PLY STRL (GAUZE/BANDAGES/DRESSINGS) ×1 IMPLANT
GLOVE BIOGEL PI IND STRL 7.5 (GLOVE) IMPLANT
GLOVE BIOGEL PI IND STRL 8 (GLOVE) ×2 IMPLANT
GLOVE ECLIPSE 8.0 STRL XLNG CF (GLOVE) ×2 IMPLANT
GLOVE SURG ENC MOIS LTX SZ8 (GLOVE) ×1 IMPLANT
GLOVE SURG UNDER POLY LF SZ8.5 (GLOVE) ×1 IMPLANT
GOWN STRL REUS W/ TWL LRG LVL3 (GOWN DISPOSABLE) IMPLANT
GOWN STRL REUS W/ TWL XL LVL3 (GOWN DISPOSABLE) ×2 IMPLANT
GOWN STRL REUS W/TWL 2XL LVL3 (GOWN DISPOSABLE) IMPLANT
GOWN STRL REUS W/TWL LRG LVL3 (GOWN DISPOSABLE)
GOWN STRL REUS W/TWL XL LVL3 (GOWN DISPOSABLE) ×4
GRAFT BONE PROTEIOS SM 1CC (Orthopedic Implant) IMPLANT
GUIDEWIRE EVEREST 1.4X620 2-PK (WIRE) IMPLANT
HEMOSTAT POWDER KIT SURGIFOAM (HEMOSTASIS) ×1 IMPLANT
KIT BASIN OR (CUSTOM PROCEDURE TRAY) ×1 IMPLANT
KIT POSITION SURG JACKSON T1 (MISCELLANEOUS) ×1 IMPLANT
KIT TURNOVER KIT B (KITS) ×1 IMPLANT
MARKER SKIN DUAL TIP RULER LAB (MISCELLANEOUS) ×1 IMPLANT
NDL BIOPSY DD SERENGETI 8G (NEEDLE) IMPLANT
NDL HYPO 18GX1.5 BLUNT FILL (NEEDLE) IMPLANT
NDL HYPO 21X1.5 SAFETY (NEEDLE) ×1 IMPLANT
NDL HYPO 25X1 1.5 SAFETY (NEEDLE) ×1 IMPLANT
NDL SPNL 22GX3.5 QUINCKE BK (NEEDLE) ×1 IMPLANT
NEEDLE BIOPSY DD SERENGETI 8G (NEEDLE) ×1 IMPLANT
NEEDLE HYPO 18GX1.5 BLUNT FILL (NEEDLE) ×1 IMPLANT
NEEDLE HYPO 21X1.5 SAFETY (NEEDLE) ×1 IMPLANT
NEEDLE HYPO 25X1 1.5 SAFETY (NEEDLE) ×1 IMPLANT
NEEDLE SPNL 22GX3.5 QUINCKE BK (NEEDLE) ×1 IMPLANT
NS IRRIG 1000ML POUR BTL (IV SOLUTION) ×1 IMPLANT
PACK LAMINECTOMY NEURO (CUSTOM PROCEDURE TRAY) ×1 IMPLANT
PAD ARMBOARD 7.5X6 YLW CONV (MISCELLANEOUS) ×3 IMPLANT
PATTIES SURGICAL .5 X.5 (GAUZE/BANDAGES/DRESSINGS) IMPLANT
PATTIES SURGICAL .5 X1 (DISPOSABLE) IMPLANT
PATTIES SURGICAL 1X1 (DISPOSABLE) IMPLANT
PUTTY BONE 100 VESUVIUS 2.5CC (Putty) IMPLANT
ROD CONT BN DENALI 5.5X60 (Rod) IMPLANT
ROD CONT BN EVEREST 5.5X65 (Rod) IMPLANT
SCREW CANN EVEREST 7.5X50 (Screw) IMPLANT
SET SCREW (Screw) ×6 IMPLANT
SET SCREW VRST (Screw) IMPLANT
SPIKE FLUID TRANSFER (MISCELLANEOUS) ×1 IMPLANT
SPONGE SURGIFOAM ABS GEL SZ50 (HEMOSTASIS) ×1 IMPLANT
SPONGE T-LAP 4X18 ~~LOC~~+RFID (SPONGE) IMPLANT
STAPLER VISISTAT 35W (STAPLE) IMPLANT
SUT VIC AB 0 CT1 18XCR BRD8 (SUTURE) ×1 IMPLANT
SUT VIC AB 0 CT1 8-18 (SUTURE) ×1
SUT VIC AB 2-0 CP2 18 (SUTURE) ×1 IMPLANT
SUT VIC AB 3-0 SH 8-18 (SUTURE) ×1 IMPLANT
SYR 20CC LL (SYRINGE) ×1 IMPLANT
SYR 5ML LL (SYRINGE) IMPLANT
TOWEL GREEN STERILE (TOWEL DISPOSABLE) ×1 IMPLANT
TOWEL GREEN STERILE FF (TOWEL DISPOSABLE) ×1 IMPLANT
TRAY FOLEY MTR SLVR 16FR STAT (SET/KITS/TRAYS/PACK) ×1 IMPLANT
WATER STERILE IRR 1000ML POUR (IV SOLUTION) ×1 IMPLANT

## 2022-04-15 NOTE — Progress Notes (Signed)
Patient surgical site swelling noted on the left, ice applied. Site monitor for an hour swelling still noted patient  complain of increase pain to L leg. MD notified. Orders given. Med given per order. Will continue to monitor.

## 2022-04-15 NOTE — Op Note (Signed)
Providing Compassionate, Quality Care - Together  Date of service: 04/15/2022  PREOP DIAGNOSIS:  L3-4, L4-5 lumbar spondylosis, severe lateral recess and foraminal stenosis with radiculopathy   POSTOP DIAGNOSIS: Same   PROCEDURE: Minimally invasive L 3-4, L4-5 decompression and transforaminal lumbar interbody fusion and arthrodesis, left sided approach Bilateral L3, L4, L5 segmental pedicle screw instrumentation, K2 M 7.5 x 50 mm bilaterally at L3, L4, L5 Placement of anterior biomechanical device, L3-4: [place] titanium K2 M interbody 10 x 28 mm; L4-5: [place] titanium K2 M interbody 28 x 14 mm Intraoperative use of autograft, same incision Intraoperative use of allograft  Intraoperative use of fluoroscopy, greater than 1 hour Intraoperative use of microscope, for microdissection   SURGEON: Dr. Pieter Partridge C. Latash Nouri, DO   ASSISTANT: Dr. Duffy Rhody, MD   ANESTHESIA: General Endotracheal   EBL: 100 cc   SPECIMENS: None   DRAINS: None   COMPLICATIONS: None   CONDITION: Hemodynamically stable   HISTORY: Philip Winters is a 61 y.o. y.o. male who initially presented to the outpatient clinic with signs and symptoms consistent with left lower extremity radiculopathy and chronic low back pain. MRI demonstrated severe spondylosis with lateral recess stenosis, foraminal stenosis at L3-4 and L4-5 with degenerative disc disease and broad-based disc bulging.  He failed multiple conservative measures and his symptomatology progressively worsened, altering his daily lifestyle. Treatment options were discussed including continued epidural steroid injections, pain control or physical therapy.  He failed multiple conservative measures.  Offered surgical intervention in the form of a minimally invasive L3-4, L4-5 decompression and transforaminal lumbar interbody fusion. All risks, benefits and expected outcomes were discussed agreed upon.  Informed consent was obtained and witnessed.   PROCEDURE  IN DETAIL: The patient was brought to the operating room. After induction of general anesthesia, the patient was positioned on the operative table in the prone position on the [place] table. All pressure points were meticulously padded. Skin incision was then marked out and prepped and draped in the usual sterile fashion. Physician driven timeout was performed.   Using biplanar fluoroscopy, the C arm for sterilely draped and brought into the field and the paramedian incisions were planned over the L3-5 interspaces.  Local anesthetic was injected bilaterally.  Using a 10 blade, paramedian incision was created bilaterally.  Using Jamshidi, the bilateral L3 pedicles were accessed under biplanar fluoroscopy.  K wires were placed and the Jamshidi's were removed.  This was repeated bilaterally at L4 and L5.  Attention was then turned to docking the Metrix dilator.  Using with a series of dilators, on the patient's left side the facet lamina junction was docked with a 26 x 6 mm tube.  This was confirmed to be in the appropriate trajectory under lateral fluoroscopy.  At this point the microscope was sterilely draped and brought into the field.   L3-4 decompression and TLIF: Using Bovie electrocautery, soft tissue was cleared from the lamina and facet at L3-4.  Using a high-speed drill and collecting the autograft, a laminectomy and complete facetectomy was performed down to the ligamentum flavum.  This autograft was saved for later.  The ligamentum flavum was identified to its attachment along the superior lamina and lateral pars.  Using a series of micro curettes, the ligamentum flavum was gently elevated and the epidural space was identified.  The ligamentum flavum was completely resected using Kerrison rongeurs.  The traversing and exiting nerve roots were identified.  Using Kerrison rongeurs, the common dural tube and traversing and exiting  nerve roots were completely decompressed from the ligamentum flavum.  They  appeared pulsatile and noncompressed.  Camden's triangle was identified, the traversing nerve root was gently retracted medially, and the epidural space was coagulated and cleared of epidural veins.  The disc space was identified.  The disc annulus was then coagulated and incised with an 11 blade.  Using Kerrison rongeurs the annulotomy was widened.  Using a series of disc prep shavers and curettes a radical discectomy was performed to subchondral bleeding bone at both endplates.  The disc base was then trialed and found to be in 10 mm interbody size.  A mixture of autograft and allograft was then placed anteriorly and medially and packed with a bone tamp.  Using a nerve root retractor, the traversing nerve root was gently protected during placement of the interbody.  Epidural hemostasis was achieved with Surgifoam.   Using lateral fluoroscopy, the interbody was then placed under live fluoroscopy.  Appropriate placement was identified.  The remainder of the autograft and allograft was then placed laterally and the intervertebral space to the interbody device.  Hemostasis was achieved with passive hemostatics and bipolar cautery.  The traversing nerve root,, neural tube and exiting nerve root were followed with a ball-tipped probe and noted to be noncompressed, pulsatile and in their normal position.  A few pieces of Gelfoam with thrombin were placed over the thecal sac.  The Metrix dilator tube was gently removed and hemostasis was achieved with bipolar cautery and the soft tissues.  L 4-5 decompression and TLIF: Using lateral fluoroscopy, the Metrix dilators were used to dilate over the L4-5 interspace.  Using Bovie electrocautery, soft tissue was cleared from the lamina and facet at L 4-5.  Using a high-speed drill and collecting the autograft, a laminectomy and complete facetectomy was performed down to the ligamentum flavum.  This autograft was saved for later.  The ligamentum flavum was identified to its  attachment along the superior lamina and lateral pars.  Using a series of micro curettes, the ligamentum flavum was gently elevated and the epidural space was identified.  There was significant adherence due to synovial cyst which was carefully dissected free and resected with Kerrison rongeurs.  The ligamentum flavum was completely resected using Kerrison rongeurs.  The traversing and exiting nerve roots were identified.  Using Kerrison rongeurs, the common dural tube and traversing and exiting nerve roots were completely decompressed from the ligamentum flavum.  They appeared pulsatile and noncompressed.  Camden's triangle was identified, the traversing nerve root was gently retracted medially, and the epidural space was coagulated and cleared of epidural veins.  The disc space was identified.  The disc annulus was then coagulated and incised with an 11 blade.  Using Kerrison rongeurs the annulotomy was widened.  Using a series of disc prep shavers and curettes a radical discectomy was performed to subchondral bleeding bone at both endplates.  The disc base was then trialed and found to be in 14 mm interbody size.  A mixture of autograft and allograft was then placed anteriorly and medially and packed with a bone tamp.  Using a nerve root retractor, the traversing nerve root was gently protected during placement of the interbody.  Epidural hemostasis was achieved with Surgifoam.   Using lateral fluoroscopy, the interbody was then placed under live fluoroscopy.  Appropriate placement was identified.  The remainder of the autograft and allograft was then placed laterally and the intervertebral space to the interbody device.  Hemostasis was achieved with passive  hemostatics and bipolar cautery.  The traversing nerve root,, neural tube and exiting nerve root were followed with a ball-tipped probe and noted to be noncompressed, pulsatile and in their normal position.  A few pieces of Gelfoam with thrombin were placed  over the thecal sac.  The Metrix dilator tube was gently removed and hemostasis was achieved with bipolar cautery and the soft tissues.   The above listed pedicle screws were then placed under lateral fluoroscopy over the K wires bilaterally at L3, L4, L5.  Appropriate sized rods were measured, contoured and placed percutaneously.  These were confirmed to be within the [place], setscrews were placed and final tightened to the manufacturer's recommendations.  Percutaneous towers were removed from the screws.  Final AP and lateral fluoroscopy images confirmed appropriate placement of all hardware.  Hemostasis was achieved with bipolar cautery.  The wound was closed in layers, 2-0 and 3-0 Vicryl sutures for the dermis.  Skin was closed with skin glue.  Sterile dressing was applied.   At the end of the case all sponge, needle, and instrument counts were correct. The patient was then transferred to the stretcher, extubated, and taken to the post-anesthesia care unit in stable hemodynamic condition.

## 2022-04-15 NOTE — Transfer of Care (Signed)
Immediate Anesthesia Transfer of Care Note  Patient: Philip Winters  Procedure(s) Performed: Minimally Invasive Surgery Decompression and Transforaminal Lumbar Interbody Fusion , Left , Lumbar three-four, Lumbar four-five (Left)  Patient Location: PACU  Anesthesia Type:General  Level of Consciousness: awake, alert , and oriented  Airway & Oxygen Therapy: Patient Spontanous Breathing and Patient connected to face mask oxygen  Post-op Assessment: Post -op Vital signs reviewed and stable  Post vital signs: stable  Last Vitals:  Vitals Value Taken Time  BP 125/76 04/15/22 1402  Temp    Pulse 117 04/15/22 1403  Resp 14 04/15/22 1400  SpO2 98 % 04/15/22 1403  Vitals shown include unvalidated device data.  Last Pain:  Vitals:   04/15/22 0701  PainSc: 5       Patients Stated Pain Goal: 1 (02/54/86 2824)  Complications: No notable events documented.

## 2022-04-15 NOTE — Progress Notes (Signed)
No need to repeat labs from 03/13/22 today per Dr. Fransisco Beau.

## 2022-04-15 NOTE — Anesthesia Procedure Notes (Signed)
Procedure Name: Intubation Date/Time: 04/15/2022 9:39 AM  Performed by: Lavell Luster, CRNAPre-anesthesia Checklist: Patient identified, Emergency Drugs available, Suction available, Patient being monitored and Timeout performed Patient Re-evaluated:Patient Re-evaluated prior to induction Oxygen Delivery Method: Circle system utilized Preoxygenation: Pre-oxygenation with 100% oxygen Induction Type: IV induction Ventilation: Mask ventilation without difficulty Laryngoscope Size: Mac and 4 Grade View: Grade II Tube type: Oral Tube size: 7.5 mm Number of attempts: 1 Airway Equipment and Method: Stylet Placement Confirmation: ETT inserted through vocal cords under direct vision, positive ETCO2 and breath sounds checked- equal and bilateral Secured at: 21 cm Tube secured with: Tape Dental Injury: Teeth and Oropharynx as per pre-operative assessment

## 2022-04-15 NOTE — Anesthesia Postprocedure Evaluation (Signed)
Anesthesia Post Note  Patient: Philip Winters  Procedure(s) Performed: Minimally Invasive Surgery Decompression and Transforaminal Lumbar Interbody Fusion , Left , Lumbar three-four, Lumbar four-five (Left)     Patient location during evaluation: PACU Anesthesia Type: General Level of consciousness: awake and alert Pain management: pain level controlled Vital Signs Assessment: post-procedure vital signs reviewed and stable Respiratory status: spontaneous breathing, nonlabored ventilation and respiratory function stable Cardiovascular status: stable and blood pressure returned to baseline Anesthetic complications: no   No notable events documented.  Last Vitals:  Vitals:   04/15/22 1500 04/15/22 1523  BP: 126/80 106/70  Pulse: (!) 106 97  Resp: 12 17  Temp: (!) 36.2 C (!) 36.4 C  SpO2: 93% 97%               Audry Pili

## 2022-04-15 NOTE — H&P (Signed)
Providing Compassionate, Quality Care - Together  NEUROSURGERY HISTORY & PHYSICAL   Philip Winters is an 61 y.o. male.   Chief Complaint: Left lower extremity radiculopathy HPI: This is a 61 year old male with a history of chronic low back pain with progressively worsening left lower extremity radiculopathy and claudication.  MRI revealed lumbar degenerative spondylosis with stenosis at L3-4 and L4-5.  He failed conservative measures and therefore presents today for surgical intervention in the form of an L3-4, L4-5 decompression, transforaminal lumbar body fusion.  Past Medical History:  Diagnosis Date   Allergy    Arthritis    CAD (coronary artery disease)    GERD (gastroesophageal reflux disease)    occ   Heart murmur    baby   High cholesterol    Hypertension    Peripheral neuropathy    Pneumonia    Right carotid bruit 06/10/2017   Sciatica    Seasonal allergies    Sinus tachycardia    Spinal stenosis     Past Surgical History:  Procedure Laterality Date   CARDIAC CATHETERIZATION     CARPAL TUNNEL WITH CUBITAL TUNNEL Left 11/10/2013   Procedure: LEFT CARPAL TUNNEL RELEASE;  Surgeon: Cammie Sickle, MD;  Location: [place];  Service: Orthopedics;  Laterality: Left;   COLONOSCOPY  2023   CORONARY ARTERY BYPASS GRAFT N/A 05/01/2020   Procedure: CORONARY ARTERY BYPASS GRAFTING (CABG) X 2 ON CARDIOPULMONARY BYPASS. LIMA TO LAD, SVG TO DIAG;  Surgeon: Ivin Poot, MD;  Location: [place];  Service: Open Heart Surgery;  Laterality: N/A;   ENDOVEIN HARVEST OF GREATER SAPHENOUS VEIN Right 05/01/2020   Procedure: ENDOVEIN HARVEST OF GREATER SAPHENOUS VEIN;  Surgeon: Ivin Poot, MD;  Location: [place];  Service: Open Heart Surgery;  Laterality: Right;   KNEE ARTHROSCOPY  [id number]   left and right   LUMBAR LAMINECTOMY/DECOMPRESSION MICRODISCECTOMY Left 09/29/2017   Procedure: LEFT LUMBAR TWO - LUMBAR THREELAMINOTOMY/MICRODISCECTOMY;  Surgeon: Jovita Gamma, MD;  Location: [place];  Service: Neurosurgery;  Laterality: Left;  LEFT LUMBAR 2- LUMBAR 3 LAMINOTOMY/MICRODISCECTOMY   NASAL SINUS SURGERY  05/2015   RIGHT/LEFT HEART CATH AND CORONARY ANGIOGRAPHY N/A 04/10/2020   Procedure: RIGHT/LEFT HEART CATH AND CORONARY ANGIOGRAPHY;  Surgeon: Aneri Slagel Sine, MD;  Location: [facility];  Service: Cardiovascular;  Laterality: N/A;   TEE WITHOUT CARDIOVERSION N/A 05/01/2020   Procedure: TRANSESOPHAGEAL ECHOCARDIOGRAM (TEE);  Surgeon: Prescott Gum, Collier Salina, MD;  Location: [place];  Service: Open Heart Surgery;  Laterality: N/A;   TOTAL KNEE ARTHROPLASTY Right 06/24/2016   Procedure: TOTAL KNEE ARTHROPLASTY;  Surgeon: Garald Balding, MD;  Location: [place];  Service: Orthopedics;  Laterality: Right;   TOTAL KNEE ARTHROPLASTY Left 12/18/2020   Procedure: LEFT TOTAL KNEE ARTHROPLASTY;  Surgeon: Garald Balding, MD;  Location: [facility];  Service: Orthopedics;  Laterality: Left;   TRIGGER FINGER RELEASE Left 11/10/2013   Procedure: LEFT INDEX A-1 PULLEY RELEASE;  Surgeon: Cammie Sickle, MD;  Location: [place];  Service: Orthopedics;  Laterality: Left;   ULNAR NERVE TRANSPOSITION Left 11/10/2013   Procedure: ULNAR NERVE TRANSPOSITION;  Surgeon: Cammie Sickle, MD;  Location: [place];  Service: Orthopedics;  Laterality: Left;    Family History  Problem Relation Age of Onset   Hypertension Mother    Throat cancer Mother    Hypertension Father    Cancer Father    Colon cancer Neg Hx  Esophageal cancer Neg Hx    Stomach cancer Neg Hx    Rectal cancer Neg Hx    Colon polyps Neg Hx    Social History:  reports that he quit smoking about 5 years ago. His smoking use included cigarettes. He has a 37.00 pack-year smoking history. He has never used smokeless tobacco. He reports current alcohol use of about 5.0 standard drinks of alcohol per week. He reports that he does not use drugs.  Allergies:  Allergies   Allergen Reactions   Mushroom Extract Complex Anaphylaxis   Penicillins Shortness Of Breath    Childhood allergy.  Has patient had a PCN reaction causing immediate rash, facial/tongue/throat swelling, SOB or lightheadedness with hypotension: No Has patient had a PCN reaction causing severe rash involving mucus membranes or skin necrosis: No Has patient had a PCN reaction that required hospitalization No Has patient had a PCN reaction occurring within the last 10 years: No If all of the above answers are "NO", then may proceed with C   Chantix [Varenicline] Other (See Comments)    headaches    Crestor [Rosuvastatin] Other (See Comments)    myalgia   Dilaudid [Hydromorphone] Nausea And Vomiting   Lipitor [Atorvastatin] Other (See Comments)    myalgia   Losartan Potassium Other (See Comments)    Lethargic    Lovaza [Omega-3-Acid Ethyl Esters] Other (See Comments)    myalgia   Oxycodone Hcl Other (See Comments)    hallucinations   Pravachol [Pravastatin] Other (See Comments)    myalgia   Bystolic [Nebivolol Hcl] Other (See Comments)    Extreme fatigue, not able to focus   Codeine Nausea And Vomiting   Iodine Rash   Iohexol Rash   Lopressor [Metoprolol] Other (See Comments)    Made patient very fatigued, could not focus   Red Dye Rash   Repatha [Evolocumab] Rash and Other (See Comments)    Hip pain and rash    Medications Prior to Admission  Medication Sig Dispense Refill   Alirocumab (PRALUENT) 75 MG/ML SOAJ Inject 1 pen  into the skin every 14 (fourteen) days. 2 mL 11   aspirin EC 81 MG tablet Take 81 mg by mouth in the morning. Swallow whole.     clobetasol cream (TEMOVATE) 3.22 % Apply 1 Application topically 2 (two) times daily as needed (skin irritation/rash.).     diltiazem (CARDIZEM CD) 240 MG 24 hr capsule Take 1 capsule (240 mg total) by mouth daily. 90 capsule 2   fluticasone (FLONASE) 50 MCG/ACT nasal spray Place 2 sprays into both nostrils in the morning.      Multiple Vitamin (MULTIVITAMIN WITH MINERALS) TABS tablet Take 1 tablet by mouth in the morning.     olmesartan (BENICAR) 20 MG tablet Take 1 tablet (20 mg total) by mouth daily. (Patient taking differently: Take 10 mg by mouth in the morning.) 90 tablet 1   Polyethyl Glycol-Propyl Glycol (LUBRICANT EYE DROPS) 0.4-0.3 % SOLN Place 1 drop into both eyes 3 (three) times daily as needed (dry/irritated eyes).     traMADol (ULTRAM) 50 MG tablet Take 25 mg by mouth every 6 (six) hours as needed (back pain).     acetaminophen (TYLENOL) 500 MG tablet Take 500-1,000 mg by mouth every 6 (six) hours as needed (pain.).     Alirocumab (PRALUENT) 75 MG/ML SOAJ Inject 75 mg into the skin every 14 (fourteen) days. 2 mL 11    Results for orders placed or performed during the hospital encounter of  04/15/22 (from the past 48 hour(s))  Type and screen [place]     Status: None (Preliminary result)   Collection Time: 04/15/22  7:15 AM  Result Value Ref Range   ABO/RH(D) PENDING    Antibody Screen PENDING    Sample Expiration      04/18/2022,2359 Performed at [facility], [place] [address], [place], [place] [address]    No results found.  ROS All pertinent positives and negatives listed HPI above  Blood pressure 132/83, pulse 97, temperature 98.2 F (36.8 C), resp. rate 18, height '5\' 8"'$  (1.727 m), weight 70.3 kg, SpO2 94 %. Physical Exam  Awake alert [place] x3, no acute distress PERRLA Cranial nerves II through XII intact Speech fluent and appropriate Full strength in upper and lower extremities symmetrically Sensory intact light touch throughout   Assessment/Plan 61 year old male with  L3-5 lumbar spondylosis, stenosis with radiculopathy  -OR today for L3-4, L4-5 minimally invasive decompression and transforaminal lumbar body fusion.  We discussed all risks, benefits and expected outcomes as well as alternatives of treatment.  He has failed multiple conservative measures.   Informed consent was obtained and witnessed.   Thank you for allowing me to participate in this patient's care.  Please do not hesitate to call with questions or concerns.   Elwin Sleight, [facility] Cell: [phone]

## 2022-04-15 NOTE — Progress Notes (Signed)
S/p decompression without drain in place.   Vanc 1g IV x1 post op.  Onnie Boer, PharmD, BCIDP, AAHIVP, CPP Infectious Disease Pharmacist 04/15/2022 3:33 PM

## 2022-04-16 ENCOUNTER — Encounter (HOSPITAL_COMMUNITY): Payer: Self-pay | Admitting: Neurological Surgery

## 2022-04-16 DIAGNOSIS — Z7982 Long term (current) use of aspirin: Secondary | ICD-10-CM | POA: Diagnosis not present

## 2022-04-16 DIAGNOSIS — M4726 Other spondylosis with radiculopathy, lumbar region: Secondary | ICD-10-CM | POA: Diagnosis not present

## 2022-04-16 DIAGNOSIS — Z7951 Long term (current) use of inhaled steroids: Secondary | ICD-10-CM | POA: Diagnosis not present

## 2022-04-16 DIAGNOSIS — M48061 Spinal stenosis, lumbar region without neurogenic claudication: Secondary | ICD-10-CM | POA: Diagnosis not present

## 2022-04-16 DIAGNOSIS — Z87891 Personal history of nicotine dependence: Secondary | ICD-10-CM | POA: Diagnosis not present

## 2022-04-16 DIAGNOSIS — I2510 Atherosclerotic heart disease of native coronary artery without angina pectoris: Secondary | ICD-10-CM | POA: Diagnosis not present

## 2022-04-16 DIAGNOSIS — I10 Essential (primary) hypertension: Secondary | ICD-10-CM | POA: Diagnosis not present

## 2022-04-16 DIAGNOSIS — Z951 Presence of aortocoronary bypass graft: Secondary | ICD-10-CM | POA: Diagnosis not present

## 2022-04-16 DIAGNOSIS — Z96653 Presence of artificial knee joint, bilateral: Secondary | ICD-10-CM | POA: Diagnosis not present

## 2022-04-16 MED ORDER — ASPIRIN 81 MG PO TBEC: 81.0000 mg | DELAYED_RELEASE_TABLET | Freq: Every morning | ORAL | 12 refills | Status: AC

## 2022-04-16 MED ORDER — OXYCODONE HCL 10 MG PO TABS: 10.0000 mg | ORAL_TABLET | ORAL | 0 refills | Status: DC | PRN

## 2022-04-16 MED ORDER — METHOCARBAMOL 500 MG PO TABS: 500.0000 mg | ORAL_TABLET | Freq: Four times a day (QID) | ORAL | 2 refills | Status: DC | PRN

## 2022-04-16 MED FILL — Thrombin For Soln 5000 Unit: CUTANEOUS | Qty: 5000 | Status: AC

## 2022-04-16 NOTE — Evaluation (Signed)
Occupational Therapy Evaluation Patient Details Name: Philip Winters MRN: [id number] DOB: 04/26/1961 Today's Date: 04/16/2022   History of Present Illness Pt is a 61 y.o. male s/p minimally invasive surgery decompression and transforaminal lumbar interbody fusion L3-5 (Left). PMH significant for arthritis, CAD, GERD, high cholesterol, HTN, peripheral neuropathy, R carotid bruit, sciatica, sinus tachycardia, L carpal tunnel release, lumbar laminectomy and decompression, R and L total knee arthroplasties.   Clinical Impression   PTA, pt reports he lived with his wife and was independent in ADL and IADL. Upon eval, pt requiring supervision for UB and LB ADL due to decreased adherence to precautions. Pt educated and demonstrating bed mobility, LB dressing, grooming, shower transfer, and toileting within precautions. Pt was provided min cues during ADL and was able to self correct to maintain precautions following cues. Reviewing precautions and pt verbalizing compensatory techniques for ADLs at end of session. Recommending discharge home with no OT follow up at this time. Re-consult if change in status. Thank you for this order. OT to sign off.      Recommendations for follow up therapy are one component of a multi-disciplinary discharge planning process, led by the attending physician.  Recommendations may be updated based on patient status, additional functional criteria and insurance authorization.   Follow Up Recommendations  No OT follow up     Assistance Recommended at Discharge Intermittent Supervision/Assistance  Patient can return home with the following A little help with bathing/dressing/bathroom;Assistance with cooking/housework;Assist for transportation;Help with stairs or ramp for entrance    Functional Status Assessment  Patient has had a recent decline in their functional status and demonstrates the ability to make significant improvements in function in a reasonable and predictable  amount of time.  Equipment Recommendations  None recommended by OT    Recommendations for Other Services       Precautions / Restrictions Precautions Precautions: Back Precaution Booklet Issued: Yes (comment) Precaution Comments: All education reviewed within the context of ADL. Required Braces or Orthoses:  (no brace needed orders) Restrictions Weight Bearing Restrictions: No      Mobility Bed Mobility Overal bed mobility: Needs Assistance Bed Mobility: Rolling, Sidelying to Sit Rolling: Supervision Sidelying to sit: Supervision       General bed mobility comments: for optimial use of log roll technique    Transfers Overall transfer level: Needs assistance Equipment used: Rolling walker (2 wheels) Transfers: Sit to/from Stand Sit to Stand: Supervision           General transfer comment: supervision for safety/adherance to precautions.      Balance Overall balance assessment: Mild deficits observed, not formally tested                                         ADL either performed or assessed with clinical judgement   ADL Overall ADL's : Needs assistance/impaired Eating/Feeding: Modified independent;Sitting   Grooming: Supervision/safety;Standing   Upper Body Bathing: Set up;Sitting   Lower Body Bathing: Supervison/ safety;Sit to/from stand   Upper Body Dressing : Set up;Sitting   Lower Body Dressing: Supervision/safety;Sit to/from stand Lower Body Dressing Details (indicate cue type and reason): Supervision during ADL for adherance to precautions Toilet Transfer: Supervision/safety;Ambulation;Rolling walker (2 wheels);Regular Toilet     Toileting - Clothing Manipulation Details (indicate cue type and reason): revieweing posterior pericare during session Tub/ Shower Transfer: Supervision/safety;Rolling walker (2 wheels);Walk-in shower;Ambulation   Functional mobility  during ADLs: Supervision/safety;Rolling walker (2 wheels) General  ADL Comments: Supervision during ADL for adherance to precautions. Supervision during funcional mobility due to requiring cues for upright posture and RW use.     Vision Baseline Vision/History: 1 Wears glasses Ability to See in Adequate Light: 0 Adequate Patient Visual Report: No change from baseline Vision Assessment?: No apparent visual deficits Additional Comments: with corrective lenses     Perception Perception Perception Tested?: No   Praxis Praxis Praxis tested?: Not tested    Pertinent Vitals/Pain Pain Assessment Pain Assessment: Faces Faces Pain Scale: Hurts little more Pain Location: operative site, LLE Pain Descriptors / Indicators: Guarding, Operative site guarding, Discomfort Pain Intervention(s): Limited activity within patient's tolerance, Monitored during session     Hand Dominance     Extremity/Trunk Assessment Upper Extremity Assessment Upper Extremity Assessment: Overall WFL for tasks assessed   Lower Extremity Assessment Lower Extremity Assessment: Defer to PT evaluation   Cervical / Trunk Assessment Cervical / Trunk Assessment: Back Surgery   Communication Communication Communication: No difficulties   Cognition Arousal/Alertness: Awake/alert Behavior During Therapy: WFL for tasks assessed/performed Overall Cognitive Status: Within Functional Limits for tasks assessed                                 General Comments: Aware of all spinal precautions and able to self correct during session intermittently.     General Comments  VSS. Reviewing all precautions at beginning, during and end of session with reference to performance during session    Exercises     Shoulder Instructions      Home Living Family/patient expects to be discharged to:: Private residence Living Arrangements: Spouse/significant other Available Help at Discharge: Family Type of Home: House Home Access: Stairs to enter Technical brewer of Steps:  2 Entrance Stairs-Rails: None Home Layout: One level     Bathroom Shower/Tub: Teacher, early years/pre: Handicapped height     [place]: Hand held shower head;Cane - single [place] (2 wheels);Rollator (4 wheels);Tub bench;Grab bars - toilet          Prior Functioning/Environment Prior Level of Function : Independent/Modified Independent;Driving             Mobility Comments: no AD ADLs Comments: independent in ADL and IADL per pt, but donning socks was becoming progressively more uncomfortable        OT Problem List: Decreased strength;Decreased activity tolerance;Impaired balance (sitting and/or standing)      OT Treatment/Interventions:      OT Goals(Current goals can be found in the care plan section) Acute Rehab OT Goals Patient Stated Goal: no pain OT Goal Formulation: With patient  OT Frequency:      Co-evaluation              AM-PAC OT "6 Clicks" Daily Activity     Outcome Measure Help from another person eating meals?: None Help from another person taking care of personal grooming?: A Little Help from another person toileting, which includes using toliet, bedpan, or urinal?: A Little Help from another person bathing (including washing, rinsing, drying)?: A Little Help from another person to put on and taking off regular upper body clothing?: A Little Help from another person to put on and taking off regular lower body clothing?: A Little 6 Click Score: 19   End of Session Equipment Utilized During Treatment: Rolling walker (2 wheels);Gait belt Nurse Communication: Mobility status  Activity Tolerance: Patient tolerated treatment well Patient left: in bed;with call bell/phone within reach;with nursing/sitter in room  OT Visit Diagnosis: Unsteadiness on feet (R26.81);Muscle weakness (generalized) (M62.81);Pain Pain - Right/Left: Left Pain - part of body: Leg (back)                Time: 2637-8588 OT Time Calculation  (min): 26 min Charges:  OT General Charges $OT Visit: 1 Visit OT Evaluation $OT Eval Low Complexity: 1 Low OT Treatments $Self Care/Home Management : 8-22 mins  Elder Cyphers, OTR/L [facility] Acute Rehabilitation Office: [phone]   Magnus Ivan 04/16/2022, 9:01 AM

## 2022-04-16 NOTE — Discharge Summary (Signed)
Physician Discharge Summary  Patient ID: Philip Winters MRN: [id number] DOB/AGE: [birth year] 61 y.o.  Admit date: 04/15/2022 Discharge date: 04/16/2022  Admission Diagnoses:  {***}  Discharge Diagnoses:  Same Principal Problem:   Lumbar spinal stenosis   Discharged Condition: Stable  Hospital Course:  Philip Winters is a 61 y.o. male {***}  Treatments: Surgery - {***}  Discharge Exam: Blood pressure 115/77, pulse 94, temperature 97.9 F (36.6 C), temperature source Oral, resp. rate 17, height '5\' 8"'$  (1.727 m), weight 70.3 kg, SpO2 97 %. Awake, alert, oriented Speech fluent, appropriate CN grossly intact 5/5 BUE/BLE Wound c/d/i  Disposition: Discharge disposition: 01-Home or Self Care       Discharge Instructions     Incentive spirometry RT   Complete by: As directed       Allergies as of 04/16/2022       Reactions   Mushroom Extract Complex Anaphylaxis   Penicillins Shortness Of Breath   Childhood allergy.  Has patient had a PCN reaction causing immediate rash, facial/tongue/throat swelling, SOB or lightheadedness with hypotension: No Has patient had a PCN reaction causing severe rash involving mucus membranes or skin necrosis: No Has patient had a PCN reaction that required hospitalization No Has patient had a PCN reaction occurring within the last 10 years: No If all of the above answers are "NO", then may proceed with C   Chantix [varenicline] Other (See Comments)   headaches   Crestor [rosuvastatin] Other (See Comments)   myalgia   Dilaudid [hydromorphone] Nausea And Vomiting   Lipitor [atorvastatin] Other (See Comments)   myalgia   Losartan Potassium Other (See Comments)   Lethargic    Lovaza [omega-3-acid Ethyl Esters] Other (See Comments)   myalgia   Oxycodone Hcl Other (See Comments)   hallucinations   Pravachol [pravastatin] Other (See Comments)   myalgia   Bystolic [nebivolol Hcl] Other (See Comments)   Extreme fatigue, not able to focus    Codeine Nausea And Vomiting   Iodine Rash   Iohexol Rash   Lopressor [metoprolol] Other (See Comments)   Made patient very fatigued, could not focus   Red Dye Rash   Repatha [evolocumab] Rash, Other (See Comments)   Hip pain and rash        Medication List     STOP taking these medications    traMADol 50 MG tablet Commonly known as: ULTRAM       TAKE these medications    acetaminophen 500 MG tablet Commonly known as: TYLENOL Take 500-1,000 mg by mouth every 6 (six) hours as needed (pain.).   aspirin EC 81 MG tablet Take 1 tablet (81 mg total) by mouth in the morning. Swallow whole. Start taking on: April 24, 2022 What changed: These instructions start on April 24, 2022. If you are unsure what to do until then, ask your doctor or other care provider.   clobetasol cream 0.05 % Commonly known as: TEMOVATE Apply 1 Application topically 2 (two) times daily as needed (skin irritation/rash.).   diltiazem 240 MG 24 hr capsule Commonly known as: CARDIZEM CD Take 1 capsule (240 mg total) by mouth daily.   fluticasone 50 MCG/ACT nasal spray Commonly known as: FLONASE Place 2 sprays into both nostrils in the morning.   Lubricant Eye Drops 0.4-0.3 % Soln Generic drug: Polyethyl Glycol-Propyl Glycol Place 1 drop into both eyes 3 (three) times daily as needed (dry/irritated eyes).   methocarbamol 500 MG tablet Commonly known as: ROBAXIN Take 1 tablet (500  mg total) by mouth every 6 (six) hours as needed for muscle spasms.   multivitamin with minerals Tabs tablet Take 1 tablet by mouth in the morning.   olmesartan 20 MG tablet Commonly known as: BENICAR Take 1 tablet (20 mg total) by mouth daily. What changed:  how much to take when to take this   Oxycodone HCl 10 MG Tabs Take 1 tablet (10 mg total) by mouth every 4 (four) hours as needed for severe pain ((score 7 to 10)).   Praluent 75 MG/ML Soaj Generic drug: Alirocumab Inject 75 mg into the skin every 14  (fourteen) days.   Praluent 75 MG/ML Soaj Generic drug: Alirocumab Inject 1 pen  into the skin every 14 (fourteen) days.         SignedTheodoro Doing Rushil Kimbrell 04/16/2022, 9:50 AM

## 2022-04-16 NOTE — Evaluation (Signed)
Physical Therapy Evaluation Patient Details Name: Philip Winters MRN: [id number] DOB: 1961-03-10 Today's Date: 04/16/2022  History of Present Illness  Pt is a 61 y.o. male s/p minimally invasive surgery decompression and transforaminal lumbar interbody fusion L3-5 (Left). PMH significant for arthritis, CAD, GERD, high cholesterol, HTN, peripheral neuropathy, R carotid bruit, sciatica, sinus tachycardia, L carpal tunnel release, lumbar laminectomy and decompression, R and L total knee arthroplasties.  Clinical Impression  Pt demonstrates overall good progress following surgery POD 1 without significant deficits in mobility. Pt has all required equipment at home. Pt was able to perform stairs per home set up without difficulty and has spouse at home for assistance. Pt requires occasional verbal cueing in order to maintain precautions. Pt is cleared to go home from PT mobility perspective at this time with intermittent assistance.  Pt left sitting EOB with needs/call bell within reach and spouse in room.      Recommendations for follow up therapy are one component of a multi-disciplinary discharge planning process, led by the attending physician.  Recommendations may be updated based on patient status, additional functional criteria and insurance authorization.  Follow Up Recommendations No PT follow up      Assistance Recommended at Discharge Intermittent Supervision/Assistance  Patient can return home with the following       Equipment Recommendations None recommended by PT (pt has all needed equipment at home.)  Recommendations for Other Services       Functional Status Assessment Patient has had a recent decline in their functional status and demonstrates the ability to make significant improvements in function in a reasonable and predictable amount of time.     Precautions / Restrictions Precautions Precautions: Back Precaution Booklet Issued: Yes (comment) (OT had left in room and  reviewed with patient with occasional need for cueing.) Precaution Comments: No bending, lifting, twisting; reviewed with pt during functional activities Required Braces or Orthoses:  (No brace needed per MD orders.) Restrictions Weight Bearing Restrictions: No      Mobility  Bed Mobility Overal bed mobility: Independent Bed Mobility: Rolling, Sidelying to Sit Rolling: Independent Sidelying to sit: Independent       General bed mobility comments: Good use of bed roll technique for supine to sitting.    Transfers Overall transfer level: Independent Equipment used: Rolling walker (2 wheels) Transfers: Sit to/from Stand Sit to Stand: Supervision           General transfer comment: Supervision for hand placement with sit to stand.    Ambulation/Gait Ambulation/Gait assistance: Supervision Gait Distance (Feet): 225 Feet Assistive device: Rolling walker (2 wheels) Gait Pattern/deviations: WFL(Within Functional Limits), Trunk flexed       General Gait Details: Pt demonstrates quick cadence with gait, equal step length bil. Pt demonstrates occasional fwd bend due to prior level of function when using RW.  Stairs Stairs: Yes Stairs assistance: Supervision Stair Management: One rail Left Number of Stairs: 2 General stair comments: performed stairs per home set up at supervision level for safety.  Wheelchair Mobility    Modified Rankin (Stroke Patients Only)       Balance Overall balance assessment: Mild deficits observed, not formally tested                                           Pertinent Vitals/Pain Pain Assessment Pain Assessment: 0-10 Faces Pain Scale: Hurts little more Pain Location:  operative site, LLE Pain Descriptors / Indicators: Guarding, Operative site guarding, Discomfort Pain Intervention(s): Limited activity within patient's tolerance, Monitored during session, Other (comment) (pt reports increased pain with activity that  improves with rest)    Home Living Family/patient expects to be discharged to:: Private residence Living Arrangements: Spouse/significant other Available Help at Discharge: Family Type of Home: House Home Access: Stairs to enter Entrance Stairs-Rails: None Entrance Stairs-Number of Steps: 2   Home Layout: One level Home Equipment: Hand held shower head;Cane - single point;Rolling Walker (2 wheels);Rollator (4 wheels);Tub bench;Grab bars - toilet      Prior Function Prior Level of Function : Independent/Modified Independent;Driving             Mobility Comments: no AD ADLs Comments: Pt functional gait, sit to stand demonstrates ability to perform ADL's that require these activities. See OT note.     Hand Dominance        Extremity/Trunk Assessment   Upper Extremity Assessment Upper Extremity Assessment: Defer to OT evaluation    Lower Extremity Assessment Lower Extremity Assessment: Overall WFL for tasks assessed    Cervical / Trunk Assessment Cervical / Trunk Assessment: Back Surgery  Communication   Communication: No difficulties  Cognition Arousal/Alertness: Awake/alert Behavior During Therapy: WFL for tasks assessed/performed Overall Cognitive Status: Within Functional Limits for tasks assessed                                 General Comments: Aware of all spinal precautions and able to self correct during session intermittently with occasional need for cues to prevent twisting with gait.        General Comments General comments (skin integrity, edema, etc.): Pt demonstrates no signs/symptoms of distress during physical therapy evaluation with any activities.    Exercises     Assessment/Plan    PT Assessment Patient needs continued PT services  PT Problem List Decreased strength;Decreased balance;Pain;Decreased mobility;Decreased activity tolerance       PT Treatment Interventions Stair training;Therapeutic activities;Gait training     PT Goals (Current goals can be found in the Care Plan section)  Acute Rehab PT Goals Patient Stated Goal: 1. Pt will perform all activities while maintaining low back precautions in order to protect the integrity of operative site. PT Goal Formulation: With patient/family Time For Goal Achievement: 04/24/22 Potential to Achieve Goals: Good    Frequency Other (Comment) (for the first week unless pt DC)     Co-evaluation               AM-PAC PT "6 Clicks" Mobility  Outcome Measure Help needed turning from your back to your side while in a flat bed without using bedrails?: None Help needed moving from lying on your back to sitting on the side of a flat bed without using bedrails?: None Help needed moving to and from a bed to a chair (including a wheelchair)?: A Little Help needed standing up from a chair using your arms (e.g., wheelchair or bedside chair)?: A Little Help needed to walk in hospital room?: A Little Help needed climbing 3-5 steps with a railing? : A Little 6 Click Score: 20    End of Session Equipment Utilized During Treatment: Gait belt Activity Tolerance: Patient tolerated treatment well Patient left: with call bell/phone within reach;with family/visitor present;in bed Nurse Communication: Other (comment) (no need for equipment, cleared for home from PT) PT Visit Diagnosis: Pain Pain - part of  body:  (back)    Time: 1006-1030 PT Time Calculation (min) (ACUTE ONLY): 24 min   Charges:   PT Evaluation $PT Eval Low Complexity: 1 Low PT Treatments $Therapeutic Activity: 8-22 mins       Tomma Rakers, DPT [place] 04/16/2022, 10:36 AM

## 2022-04-16 NOTE — Plan of Care (Signed)
Patient alert and oriented, VSS, pt void small swelling to surgical site but clean and dry. DR. Dawley aware. D/c instructions explain and given to the patient. Will d/c pt home per order.

## 2022-04-22 ENCOUNTER — Other Ambulatory Visit: Payer: Self-pay | Admitting: Internal Medicine

## 2022-04-22 ENCOUNTER — Other Ambulatory Visit (HOSPITAL_BASED_OUTPATIENT_CLINIC_OR_DEPARTMENT_OTHER): Payer: Self-pay

## 2022-04-22 DIAGNOSIS — I10 Essential (primary) hypertension: Secondary | ICD-10-CM

## 2022-04-22 MED ORDER — OLMESARTAN MEDOXOMIL 20 MG PO TABS: 20.0000 mg | ORAL_TABLET | Freq: Every day | ORAL | 0 refills | Status: DC

## 2022-04-30 DIAGNOSIS — M48062 Spinal stenosis, lumbar region with neurogenic claudication: Secondary | ICD-10-CM | POA: Diagnosis not present

## 2022-05-05 ENCOUNTER — Other Ambulatory Visit (HOSPITAL_BASED_OUTPATIENT_CLINIC_OR_DEPARTMENT_OTHER): Payer: Self-pay

## 2022-05-07 ENCOUNTER — Other Ambulatory Visit (HOSPITAL_BASED_OUTPATIENT_CLINIC_OR_DEPARTMENT_OTHER): Payer: Self-pay

## 2022-06-04 ENCOUNTER — Other Ambulatory Visit (HOSPITAL_BASED_OUTPATIENT_CLINIC_OR_DEPARTMENT_OTHER): Payer: Self-pay

## 2022-06-04 ENCOUNTER — Other Ambulatory Visit: Payer: Self-pay | Admitting: Cardiology

## 2022-06-04 DIAGNOSIS — I700 Atherosclerosis of aorta: Secondary | ICD-10-CM

## 2022-06-04 DIAGNOSIS — I25110 Atherosclerotic heart disease of native coronary artery with unstable angina pectoris: Secondary | ICD-10-CM

## 2022-06-04 DIAGNOSIS — Z789 Other specified health status: Secondary | ICD-10-CM

## 2022-06-04 NOTE — Telephone Encounter (Signed)
New PA needed, will send in refill once approved.  Key: [id number]

## 2022-06-06 ENCOUNTER — Other Ambulatory Visit (HOSPITAL_BASED_OUTPATIENT_CLINIC_OR_DEPARTMENT_OTHER): Payer: Self-pay

## 2022-06-06 MED FILL — Alirocumab Subcutaneous Solution Auto-Injector 75 MG/ML: SUBCUTANEOUS | 28 days supply | Qty: 2 | Fill #0 | Status: AC

## 2022-06-07 ENCOUNTER — Other Ambulatory Visit (HOSPITAL_BASED_OUTPATIENT_CLINIC_OR_DEPARTMENT_OTHER): Payer: Self-pay

## 2022-06-09 ENCOUNTER — Telehealth: Payer: Self-pay | Admitting: Pharmacist

## 2022-06-09 ENCOUNTER — Other Ambulatory Visit (HOSPITAL_BASED_OUTPATIENT_CLINIC_OR_DEPARTMENT_OTHER): Payer: Self-pay

## 2022-06-09 DIAGNOSIS — L308 Other specified dermatitis: Secondary | ICD-10-CM | POA: Diagnosis not present

## 2022-06-09 DIAGNOSIS — L0100 Impetigo, unspecified: Secondary | ICD-10-CM | POA: Diagnosis not present

## 2022-06-09 MED ORDER — MUPIROCIN 2 % EX OINT
1.0000 | TOPICAL_OINTMENT | Freq: Three times a day (TID) | CUTANEOUS | 3 refills | Status: DC | PRN
  Filled 2022-06-09: qty 22, 8d supply, fill #0

## 2022-06-09 MED ORDER — CLOBETASOL PROPIONATE 0.05 % EX CREA
1.0000 | TOPICAL_CREAM | CUTANEOUS | 3 refills | Status: DC
  Filled 2022-06-09: qty 60, 30d supply, fill #0
  Filled 2022-07-03 (×2): qty 60, 30d supply, fill #1
  Filled 2022-08-02: qty 60, 30d supply, fill #2

## 2022-06-09 MED ORDER — DOXYCYCLINE HYCLATE 100 MG PO TABS
100.0000 mg | ORAL_TABLET | Freq: Two times a day (BID) | ORAL | 1 refills | Status: DC
  Filled 2022-06-09: qty 28, 14d supply, fill #0
  Filled 2022-06-17: qty 28, 14d supply, fill #1

## 2022-06-09 NOTE — Telephone Encounter (Signed)
Praluent prior authorization renewed through 06/03/23.

## 2022-06-10 ENCOUNTER — Other Ambulatory Visit (HOSPITAL_BASED_OUTPATIENT_CLINIC_OR_DEPARTMENT_OTHER): Payer: Self-pay

## 2022-06-15 NOTE — Progress Notes (Incomplete)
Office Visit    Patient Name: Philip Winters Date of Encounter: 06/15/2022  PCP:  Janith Lima, MD   [place]  Cardiologist:  Candee Furbish, MD  Advanced Practice Provider:  No care team member to display Electrophysiologist:  None   HPI    Philip Winters is a 62 y.o. male with past medical history of CAD post CABG, hypertension, bicuspid aortic valve presents today for follow-up appointment.  He presented to urgent care 10/10/2021 with symptoms concerning for viral illness.  He was recommended to follow-up conservative care.  Call the office 06/06/2021 reporting his PCP started him on olmesartan 20 mg daily the prior day.  He was concerned about adverse reactions as he was already taking diltiazem 240 mg daily.  Noted that diltiazem has been removed from his past medical list 04/17/2021 as he reported not taking it.  It was readded to his list and never discontinue diltiazem.  CABG x 2 LIMA to LAD (90% proximal LAD) and SVG to diagonal on 05/01/2020 by Dr. Prescott Gum.  High risk nuclear stress test.  Extensive anginal symptoms prior.  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  Has bicuspid aortic valve.  4.1 cm aortic root.  1.5 cm infiltrative density in the superior segment of the left upper lobe which will need to be followed.  Did not require replacement at the time of CABG.  He reported interesting bilateral hand swelling left greater than right and puffiness lasted about 2 days.  Subsided.  Dr. Christoper Fabian was able to check some blood work and evaluate.  He had also stopped his Repatha due to hip pain.  He was asked to reach out to our lipid clinic.  His left knee was being drained frequently, needed replacement.  Agreed it was reasonable to proceed as he was close to 6 months post CABG.  He was last seen 11/29/2021 and he was post have lumbar fusion 09/2021.  He endorses gradual worsening of numbness in bilateral feet.  He  wanted to work on improving his health and exercise first.  For exercise he routinely went to [employer] gym.  Also with walking daily.  Remain compliant with prior Lamisil tolerates it well.  Recent labs with LDL 44.  Today he*** Past Medical History    Past Medical History:  Diagnosis Date   Allergy    Arthritis    CAD (coronary artery disease)    GERD (gastroesophageal reflux disease)    occ   Heart murmur    baby   High cholesterol    Hypertension    Peripheral neuropathy    Pneumonia    Right carotid bruit 06/10/2017   Sciatica    Seasonal allergies    Sinus tachycardia    Spinal stenosis    Past Surgical History:  Procedure Laterality Date   CARDIAC CATHETERIZATION     CARPAL TUNNEL WITH CUBITAL TUNNEL Left 11/10/2013   Procedure: LEFT CARPAL TUNNEL RELEASE;  Surgeon: Cammie Sickle, MD;  Location: [place] [facility];  Service: Orthopedics;  Laterality: Left;   COLONOSCOPY  2023   CORONARY ARTERY BYPASS GRAFT N/A 05/01/2020   Procedure: CORONARY ARTERY BYPASS GRAFTING (CABG) X 2 ON CARDIOPULMONARY BYPASS. LIMA TO LAD, SVG TO DIAG;  Surgeon: Ivin Poot, MD;  Location: [place];  Service: Open Heart Surgery;  Laterality: N/A;   ENDOVEIN HARVEST OF GREATER SAPHENOUS VEIN Right 05/01/2020   Procedure: ENDOVEIN HARVEST OF  GREATER SAPHENOUS VEIN;  Surgeon: Ivin Poot, MD;  Location: [place];  Service: Open Heart Surgery;  Laterality: Right;   KNEE ARTHROSCOPY  [id number]   left and right   LUMBAR LAMINECTOMY/DECOMPRESSION MICRODISCECTOMY Left 09/29/2017   Procedure: LEFT LUMBAR TWO - LUMBAR THREELAMINOTOMY/MICRODISCECTOMY;  Surgeon: Jovita Gamma, MD;  Location: [place];  Service: Neurosurgery;  Laterality: Left;  LEFT LUMBAR 2- LUMBAR 3 LAMINOTOMY/MICRODISCECTOMY   NASAL SINUS SURGERY  05/2015   RIGHT/LEFT HEART CATH AND CORONARY ANGIOGRAPHY N/A 04/10/2020   Procedure: RIGHT/LEFT HEART CATH AND CORONARY ANGIOGRAPHY;  Surgeon: Troy Sine, MD;  Location: [facility];  Service: Cardiovascular;  Laterality: N/A;   TEE WITHOUT CARDIOVERSION N/A 05/01/2020   Procedure: TRANSESOPHAGEAL ECHOCARDIOGRAM (TEE);  Surgeon: Prescott Gum, Collier Salina, MD;  Location: [place];  Service: Open Heart Surgery;  Laterality: N/A;   TOTAL KNEE ARTHROPLASTY Right 06/24/2016   Procedure: TOTAL KNEE ARTHROPLASTY;  Surgeon: Garald Balding, MD;  Location: [place];  Service: Orthopedics;  Laterality: Right;   TOTAL KNEE ARTHROPLASTY Left 12/18/2020   Procedure: LEFT TOTAL KNEE ARTHROPLASTY;  Surgeon: Garald Balding, MD;  Location: [facility];  Service: Orthopedics;  Laterality: Left;   TRANSFORAMINAL LUMBAR INTERBODY FUSION W/ MIS 2 LEVEL Left 04/15/2022   Procedure: Minimally Invasive Surgery Decompression and Transforaminal Lumbar Interbody Fusion , Left , Lumbar three-four, Lumbar four-five;  Surgeon: Dawley, Theodoro Doing, DO;  Location: [place];  Service: Neurosurgery;  Laterality: Left;   TRIGGER FINGER RELEASE Left 11/10/2013   Procedure: LEFT INDEX A-1 PULLEY RELEASE;  Surgeon: Cammie Sickle, MD;  Location: [place];  Service: Orthopedics;  Laterality: Left;   ULNAR NERVE TRANSPOSITION Left 11/10/2013   Procedure: ULNAR NERVE TRANSPOSITION;  Surgeon: Cammie Sickle, MD;  Location: [place];  Service: Orthopedics;  Laterality: Left;    Allergies  Allergies  Allergen Reactions   Mushroom Extract Complex Anaphylaxis   Penicillins Shortness Of Breath    Childhood allergy.  Has patient had a PCN reaction causing immediate rash, facial/tongue/throat swelling, SOB or lightheadedness with hypotension: No Has patient had a PCN reaction causing severe rash involving mucus membranes or skin necrosis: No Has patient had a PCN reaction that required hospitalization No Has patient had a PCN reaction occurring within the last 10 years: No If all of the above answers are "NO", then may proceed with C   Chantix [Varenicline] Other (See Comments)     headaches    Crestor [Rosuvastatin] Other (See Comments)    myalgia   Dilaudid [Hydromorphone] Nausea And Vomiting   Lipitor [Atorvastatin] Other (See Comments)    myalgia   Losartan Potassium Other (See Comments)    Lethargic    Lovaza [Omega-3-Acid Ethyl Esters] Other (See Comments)    myalgia   Oxycodone Hcl Other (See Comments)    hallucinations   Pravachol [Pravastatin] Other (See Comments)    myalgia   Bystolic [Nebivolol Hcl] Other (See Comments)    Extreme fatigue, not able to focus   Codeine Nausea And Vomiting   Iodine Rash   Iohexol Rash   Lopressor [Metoprolol] Other (See Comments)    Made patient very fatigued, could not focus   Red Dye Rash   Repatha [Evolocumab] Rash and Other (See Comments)    Hip pain and rash    History of Present Illness    AIRON SAHNI is a 62 y.o. male with a hx of *** last seen ***.  EKGs/Labs/Other Studies Reviewed:   The following studies were reviewed today: ***  EKG:  EKG is *** ordered today.  The ekg ordered today demonstrates ***  Recent Labs: 10/10/2021: ALT 31 10/16/2021: Pro B Natriuretic peptide (BNP) 50.0 03/13/2022: BUN 14; Creatinine, Ser 0.68; Hemoglobin 15.2; Platelets 194; Potassium 4.5; Sodium 137  Recent Lipid Panel    Component Value Date/Time   CHOL 122 07/22/2021 0855   TRIG 98 07/22/2021 0855   HDL 60 07/22/2021 0855   CHOLHDL 2.0 07/22/2021 0855   LDLCALC 44 07/22/2021 0855    Risk Assessment/Calculations:  {Does this patient have ATRIAL FIBRILLATION?:[phone]}  Home Medications   No outpatient medications have been marked as taking for the 06/16/22 encounter (Appointment) with Elgie Collard, PA-C.     Review of Systems   ***   All other systems reviewed and are otherwise negative except as noted above.  Physical Exam    VS:  There were no vitals taken for this visit. , BMI There is no height or weight on file to calculate BMI.  Wt Readings from Last 3 Encounters:  04/15/22 155 lb  (70.3 kg)  03/13/22 150 lb 6.4 oz (68.2 kg)  01/28/22 154 lb 9.6 oz (70.1 kg)     GEN: Well nourished, well developed, in no acute distress. HEENT: normal. Neck: Supple, no JVD, carotid bruits, or masses. Cardiac: ***RRR, no murmurs, rubs, or gallops. No clubbing, cyanosis, edema.  ***Radials/PT 2+ and equal bilaterally.  Respiratory:  ***Respirations regular and unlabored, clear to auscultation bilaterally. GI: Soft, nontender, nondistended. MS: No deformity or atrophy. Skin: Warm and dry, no rash. Neuro:  Strength and sensation are intact. Psych: Normal affect.  Assessment & Plan    ***  No BP recorded.  {Refresh Note OR Click here to enter BP  :1}***      Disposition: Follow up {follow up:15908} with Candee Furbish, MD or APP.  Signed, Elgie Collard, PA-C 06/15/2022, 6:32 PM [facility]

## 2022-06-16 ENCOUNTER — Ambulatory Visit: Payer: BC Managed Care – PPO | Admitting: Physician Assistant

## 2022-06-17 ENCOUNTER — Other Ambulatory Visit (HOSPITAL_BASED_OUTPATIENT_CLINIC_OR_DEPARTMENT_OTHER): Payer: Self-pay

## 2022-06-18 ENCOUNTER — Ambulatory Visit (HOSPITAL_BASED_OUTPATIENT_CLINIC_OR_DEPARTMENT_OTHER): Payer: BC Managed Care – PPO | Attending: Family Medicine | Admitting: Physical Therapy

## 2022-06-18 ENCOUNTER — Other Ambulatory Visit: Payer: Self-pay

## 2022-06-18 ENCOUNTER — Encounter (HOSPITAL_BASED_OUTPATIENT_CLINIC_OR_DEPARTMENT_OTHER): Payer: Self-pay | Admitting: Physical Therapy

## 2022-06-18 DIAGNOSIS — R262 Difficulty in walking, not elsewhere classified: Secondary | ICD-10-CM | POA: Diagnosis not present

## 2022-06-18 DIAGNOSIS — M545 Low back pain, unspecified: Secondary | ICD-10-CM | POA: Diagnosis not present

## 2022-06-18 DIAGNOSIS — M6281 Muscle weakness (generalized): Secondary | ICD-10-CM | POA: Insufficient documentation

## 2022-06-18 NOTE — Therapy (Signed)
OUTPATIENT PHYSICAL THERAPY THORACOLUMBAR EVALUATION   Patient Name: Philip Winters MRN: [id number] DOB:[birth year], 62 y.o., male Today's Date: 06/18/2022  END OF SESSION:  PT End of Session - 06/18/22 0942     Visit Number 1    Number of Visits 16    Date for PT Re-Evaluation 09/16/22    Authorization Type BC[employer]    PT Start Time 0933    PT Stop Time 3536    PT Time Calculation (min) 42 min    Activity Tolerance Patient tolerated treatment well    Behavior During Therapy [facility] for tasks assessed/performed             Past Medical History:  Diagnosis Date   Allergy    Arthritis    CAD (coronary artery disease)    GERD (gastroesophageal reflux disease)    occ   Heart murmur    baby   High cholesterol    Hypertension    Peripheral neuropathy    Pneumonia    Right carotid bruit 06/10/2017   Sciatica    Seasonal allergies    Sinus tachycardia    Spinal stenosis    Past Surgical History:  Procedure Laterality Date   CARDIAC CATHETERIZATION     CARPAL TUNNEL WITH CUBITAL TUNNEL Left 11/10/2013   Procedure: LEFT CARPAL TUNNEL RELEASE;  Surgeon: Cammie Sickle, MD;  Location: [place];  Service: Orthopedics;  Laterality: Left;   COLONOSCOPY  2023   CORONARY ARTERY BYPASS GRAFT N/A 05/01/2020   Procedure: CORONARY ARTERY BYPASS GRAFTING (CABG) X 2 ON CARDIOPULMONARY BYPASS. LIMA TO LAD, SVG TO DIAG;  Surgeon: Ivin Poot, MD;  Location: [place];  Service: Open Heart Surgery;  Laterality: N/A;   ENDOVEIN HARVEST OF GREATER SAPHENOUS VEIN Right 05/01/2020   Procedure: ENDOVEIN HARVEST OF GREATER SAPHENOUS VEIN;  Surgeon: Ivin Poot, MD;  Location: [place];  Service: Open Heart Surgery;  Laterality: Right;   KNEE ARTHROSCOPY  1443,1540   left and right   LUMBAR LAMINECTOMY/DECOMPRESSION MICRODISCECTOMY Left 09/29/2017   Procedure: LEFT LUMBAR TWO - LUMBAR THREELAMINOTOMY/MICRODISCECTOMY;  Surgeon: Jovita Gamma, MD;  Location: [place];  Service:  Neurosurgery;  Laterality: Left;  LEFT LUMBAR 2- LUMBAR 3 LAMINOTOMY/MICRODISCECTOMY   NASAL SINUS SURGERY  05/2015   RIGHT/LEFT HEART CATH AND CORONARY ANGIOGRAPHY N/A 04/10/2020   Procedure: RIGHT/LEFT HEART CATH AND CORONARY ANGIOGRAPHY;  Surgeon: Troy Sine, MD;  Location: [place] [facility];  Service: Cardiovascular;  Laterality: N/A;   TEE WITHOUT CARDIOVERSION N/A 05/01/2020   Procedure: TRANSESOPHAGEAL ECHOCARDIOGRAM (TEE);  Surgeon: Prescott Gum, Collier Salina, MD;  Location: [place];  Service: Open Heart Surgery;  Laterality: N/A;   TOTAL KNEE ARTHROPLASTY Right 06/24/2016   Procedure: TOTAL KNEE ARTHROPLASTY;  Surgeon: Garald Balding, MD;  Location: [place];  Service: Orthopedics;  Laterality: Right;   TOTAL KNEE ARTHROPLASTY Left 12/18/2020   Procedure: LEFT TOTAL KNEE ARTHROPLASTY;  Surgeon: Garald Balding, MD;  Location: [facility];  Service: Orthopedics;  Laterality: Left;   TRANSFORAMINAL LUMBAR INTERBODY FUSION W/ MIS 2 LEVEL Left 04/15/2022   Procedure: Minimally Invasive Surgery Decompression and Transforaminal Lumbar Interbody Fusion , Left , Lumbar three-four, Lumbar four-five;  Surgeon: Dawley, Theodoro Doing, DO;  Location: [place];  Service: Neurosurgery;  Laterality: Left;   TRIGGER FINGER RELEASE Left 11/10/2013   Procedure: LEFT INDEX A-1 PULLEY RELEASE;  Surgeon: Cammie Sickle, MD;  Location: [place];  Service: Orthopedics;  Laterality: Left;  ULNAR NERVE TRANSPOSITION Left 11/10/2013   Procedure: ULNAR NERVE TRANSPOSITION;  Surgeon: Cammie Sickle, MD;  Location: [place];  Service: Orthopedics;  Laterality: Left;   Patient Active Problem List   Diagnosis Date Noted   Lumbar spinal stenosis 04/15/2022   COVID-19 03/27/2022   Chronic pain of left thumb 02/25/2022   SBE (subacute bacterial endocarditis) prophylaxis candidate 12/16/2021   Trigger finger, left ring finger 11/06/2021   Cervical radiculopathy 08/13/2021   Ulnar neuropathy  08/13/2021   Screen for colon cancer 06/06/2021   Chronic tachycardia 06/04/2021   Candidal balanitis 06/04/2021   Carpal tunnel syndrome, bilateral upper limbs 09/26/2020   Cubital tunnel syndrome on left 08/23/2020   Pulmonary nodule 1 cm or greater in diameter 04/26/2020   Statin intolerance 04/20/2020   Coronary atherosclerosis of native coronary artery 04/18/2020   Nonrheumatic aortic valve stenosis    Bicuspid aortic valve 04/06/2020   Primary osteoarthritis of left knee 11/08/2019   Bilateral primary osteoarthritis of knee 09/28/2019   HNP (herniated nucleus pulposus), lumbar 09/29/2017   Unilateral primary osteoarthritis, right knee 06/24/2016   Primary hypertension 01/18/2007    PCP: Janith Lima, MD   REFERRING PROVIDER: Noralee Stain, MD   REFERRING DIAG: [phone] (ICD-10-CM) - Spinal stenosis, lumbar region without neurogenic claudication   Rationale for Evaluation and Treatment: Rehabilitation  THERAPY DIAG:  Pain, lumbar region  Muscle weakness (generalized)  Difficulty walking  ONSET DATE: 04/15/22   Minimally invasive L 3-4, L4-5 decompression and transforaminal lumbar interbody fusion and arthrodesis, left sided approach Bilateral L3, L4, L5 segmental pedicle screw instrumentation, K2 M 7.5 x 50 mm bilaterally at L3, L4, L5 Placement of anterior biomechanical device, L3-4: [place] titanium K2 M interbody 10 x 28 mm; L4-5: [place] titanium K2 M interbody 28 x 14 mm Intraoperative use of autograft, same incision Intraoperative use of allograft  Intraoperative use of fluoroscopy, greater than 1 hour Intraoperative use of microscope, for microdissection    SUBJECTIVE:                                                                                                                                                                                           SUBJECTIVE STATEMENT: Pt is here today for post-op lumbar fusion. Pt  states he does still  have pain with the LBP- L tailbone. Pt states the stiffness was getting worse about 2 weeks ago.He feels it the most with sitting for too long. Woke up  two weeks ago with more stiffness and tightness than usual. Pt is a member of [employer] and does free weights but has not been  back since October. Has been following spinal precautions and has done no BLT at home for exercise. Pt has the most trouble with bending and picking things up during daily life. He is unable to walk his dog at this time. NT into bilat LE to feet. Pt states it is slightly better but still numb all the time. Pt states MD cleared him to start light stretching.   PERTINENT HISTORY:  Bilat TKA  PAIN:  Are you having pain? Yes: NPRS scale: 6/10 Pain location: L L/S Pain description: sharp/ stabbing Aggravating factors: sitting still, bending, towards the L Relieving factors: standing/walking, muscle relaxers, heating  PRECAUTIONS: Back  WEIGHT BEARING RESTRICTIONS: No  FALLS:  Has patient fallen in last 6 months? 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  L4-5 also shows severe left and moderate right foraminal narrowing and moderate posterior canal narrowing and possible encroachment on the exiting nerve root on the left.  L2/3 shows postoperative changes with mild posterior canal and bilateral foraminal narrowing  as well.    PATIENT SURVEYS:  FOTO 61 66 @ DC 5 pts MCII  SCREENING FOR RED FLAGS: Bowel or bladder incontinence: No Spinal tumors: No Cauda equina syndrome: No Compression fracture: No   COGNITION: Overall cognitive status: Within functional limits for tasks assessed     SENSATION: Light touch: Impaired   MUSCLE LENGTH: Hamstrings: limited in supine 90/90 bilat  POSTURE: decreased lumbar lordosis  PALPATION:  TTP and hypertonicity of bilat lumbar paraspinals and QL   LUMBAR ROM: limited as expected following fusion; no pain; stiffness  AROM eval  Flexion 50%  Extension 0%  Right lateral flexion 50%  Left lateral flexion 50%  Right rotation 50%  Left rotation 50%   (Blank rows = not tested)  LOWER EXTREMITY ROM:     Active  Right eval Left eval  Hip flexion 110 110  Hip extension 0 0  Hip abduction 30 30  Hip adduction    Hip internal rotation 20 15  Hip external rotation 30 35   (Blank rows = not tested)   MMT deferred at initial session; WNL for transfers, rolling, and STS  LUMBAR SPECIAL TESTS:  Slump test: Negative  FUNCTIONAL TESTS:  Step up/down: decreased eccentric lower, UE assist required; no pain with ascent or descent; can step reciprocally   GAIT: Distance walked: 2f Assistive device utilized: None Level of assistance: Complete Independence Comments: WFL; R SB in standing, toe out, R shoulder drop  TODAY'S TREATMENT:  DATE: 1/31   Exercises - Supine Posterior Pelvic Tilt  - 2 x daily - 7 x weekly - 2 sets - 10 reps - 2 hold - Seated Quadratus Lumborum Stretch in Chair  - 2 x daily - 7 x weekly - 1 sets - 3 reps - 30 hold - Seated Flexion Stretch with Swiss Ball  - 1 x daily - 7 x weekly - 2 sets - 10 reps - 5 hold - Standing Quadratus Lumborum Mobilization with Small Ball on Wall  - 1 x daily - 7 x weekly  - 1 sets - 1 reps - 2-5 mins hold    Recumbent or upright bike, no OH lifting, machines for resistance training at the start, no ab crunch/rotation/extension machine  PATIENT EDUCATION:  Education details: MOI, diagnosis, prognosis, precautions, safety, walking/gym equipment, anatomy, exercise progression, DOMS expectations, muscle firing,  envelope of function, HEP, POC Person educated: Patient Education method: Explanation, Demonstration, Tactile cues, Verbal cues, and Handouts Education comprehension: verbalized understanding, returned demonstration, verbal cues required, and tactile cues required  HOME EXERCISE PROGRAM:  Access Code: [email or web address] URL: [email or web address] Date: 06/18/2022 Prepared by: [name]  ASSESSMENT:  CLINICAL IMPRESSION: Patient is a 62 y.o. male who was seen today for physical therapy evaluation and treatment for c/c of L/S stiffness and pain following lumbar fusion. Pt's s/s appear consistent with expected mechanical joint stiffness as well as soft tissue spasm and restriction with post-op status. Pt's pain is minimally sensitive and irritable with movement. Pt's has more soft tissue restrictions at this time vs a mobility or strength deficit. Pt does appear to have proximal hip weakness with functional mobility screening. Plan to continue with STM/TPDN, flexibility, and lumbopelvic strength at future sessions. Pt would benefit from continued skilled therapy in order to reach goals and maximize functional strength and ROM for return to exercise, ADL, and recreation   OBJECTIVE IMPAIRMENTS: Abnormal gait, decreased activity tolerance, decreased endurance, decreased mobility, difficulty walking, decreased ROM, decreased strength, hypomobility, increased fascial restrictions, increased muscle spasms, impaired flexibility, impaired sensation, improper body mechanics, postural dysfunction, and pain.   ACTIVITY LIMITATIONS: carrying, lifting, bending,  sitting, standing, squatting, stairs, transfers, and locomotion level  PARTICIPATION LIMITATIONS: cleaning, laundry, interpersonal relationship, driving, shopping, community activity, yard work, and exercise  PERSONAL FACTORS: Age, Time since onset of injury/illness/exacerbation, and 1-2 comorbidities:    are also affecting patient's functional outcome.   REHAB POTENTIAL: Good  CLINICAL DECISION MAKING: Stable/uncomplicated  EVALUATION COMPLEXITY: Low   GOALS:   SHORT TERM GOALS: Target date: 07/30/2022   Pt will become independent with HEP in order to demonstrate synthesis of PT education.   Goal status: INITIAL  2.  Pt will report at least 2 pt reduction on NPRS scale for pain in order to demonstrate functional improvement with household activity, self care, and ADL.   Goal status: INITIAL  3.  Pt will score at least 5 pt increase on FOTO to demonstrate functional improvement in MCII and pt perceived function.   Goal status: INITIAL   LONG TERM GOALS: Target date: 09/10/2022   Pt  will become independent with final HEP in order to demonstrate synthesis of PT education.  Goal status: INITIAL  2.  Pt will score >/= 66 on FOTO to demonstrate improvement in perceived lumbar function.   Goal status: INITIAL  3.  Pt will be able to demonstrate/report ability to walk >30 mins without pain in order to demonstrate functional improvement and tolerance to exercise and community mobility.  Goal status: INITIAL  4.  Pt will be able to demonstrate DL squat with >/=10 lbs in order to demonstrate functional improvement in bilat LE strength for return to ADL and light exercise.   Goal status: INITIAL  PLAN:  PT FREQUENCY: 1x/week  PT DURATION: 12 weeks (likely D/C by 8 wks)  PLANNED INTERVENTIONS: Therapeutic exercises, Therapeutic activity, Neuromuscular re-education, Balance training, Gait training, Patient/Family education, Self Care, Joint mobilization, Joint manipulation,  Stair training, Orthotic/Fit training, DME instructions, Aquatic Therapy, Dry Needling, Electrical stimulation, Spinal manipulation, Spinal mobilization, Cryotherapy, Moist heat, scar mobilization, Splintting, Taping, Vasopneumatic device, Traction, Ultrasound, Ionotophoresis '4mg'$ /ml Dexamethasone, Manual therapy, and Re-evaluation.  PLAN FOR NEXT SESSION: STM, TPDN PRN with lumbar fusion and past laminectomy in mind, lumbar and hip stretching, progressive LE and hip strength   [name], PT 06/18/2022, 1:32 PM

## 2022-06-19 NOTE — Progress Notes (Signed)
Office Visit    Patient Name: Philip Winters Date of Encounter: 06/20/2022  PCP:  Janith Lima, MD   [place]  Cardiologist:  Candee Furbish, MD  Advanced Practice Provider:  No care team member to display Electrophysiologist:  None   HPI    AMEIR FARIA is a 62 y.o. male with past medical history of CAD post CABG 12/21, hypertension, bicuspid aortic valve presents today for follow-up appointment.  He presented to urgent care 10/10/2021 with symptoms concerning for viral illness.  He was recommended to follow-up conservative care.  Call the office 06/06/2021 reporting his PCP started him on olmesartan 20 mg daily the prior day.  He was concerned about adverse reactions as he was already taking diltiazem 240 mg daily.  Noted that diltiazem has been removed from his past medical list 04/17/2021 as he reported not taking it.  It was readded to his list and never discontinue diltiazem.  CABG x 2 LIMA to LAD (90% proximal LAD) and SVG to diagonal on 05/01/2020 by Dr. Prescott Gum.  High risk nuclear stress test.  Extensive anginal symptoms prior.  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  Has bicuspid aortic valve.  4.1 cm aortic root.  1.5 cm infiltrative density in the superior segment of the left upper lobe which will need to be followed.  Did not require replacement at the time of CABG.  He reported interesting bilateral hand swelling left greater than right and puffiness lasted about 2 days.  Subsided.  Dr. Christoper Fabian was able to check some blood work and evaluate.  He had also stopped his Repatha due to hip pain.  He was asked to reach out to our lipid clinic.  His left knee was being drained frequently, needed replacement.  Agreed it was reasonable to proceed as he was close to 6 months post CABG.  He was last seen 11/29/2021 and he was post have lumbar fusion 09/2021.  He endorses gradual worsening of numbness in bilateral feet.   He wanted to work on improving his health and exercise first.  For exercise he routinely went to schedule gym.  Also with walking daily.  Remain compliant with prior Lamisil tolerates it well.  Recent labs with LDL 44.  Today he tells me he had back surgery back in November.  He was on oxycodone for pain and then started on prednisone for his eczema.  He states that he felt like he was having a heart attack with these 2 medications.  He had heart palpitations as well as chest tightness and shortness of breath.  He felt awful.  He was on prednisone for a week and felt better when he was taken off of it.  He tells me he is naturally a bit tachycardic anywhere from 100-115 is normal for him.  His blood pressure is well-controlled today. We discussed dropping his Benicar dose since his wife is currently cutting his pills in half. Otherwise doing well.   Reports no shortness of breath nor dyspnea on exertion. Reports no chest pain, pressure, or tightness. No edema, orthopnea, PND. Reports no palpitations. (Outside of prednisone use)  Past Medical History    Past Medical History:  Diagnosis Date   Allergy    Arthritis    CAD (coronary artery disease)    GERD (gastroesophageal reflux disease)    occ   Heart murmur    baby   High cholesterol    Hypertension  Peripheral neuropathy    Pneumonia    Right carotid bruit 06/10/2017   Sciatica    Seasonal allergies    Sinus tachycardia    Spinal stenosis    Past Surgical History:  Procedure Laterality Date   CARDIAC CATHETERIZATION     CARPAL TUNNEL WITH CUBITAL TUNNEL Left 11/10/2013   Procedure: LEFT CARPAL TUNNEL RELEASE;  Surgeon: Cammie Sickle, MD;  Location: [facility];  Service: Orthopedics;  Laterality: Left;   COLONOSCOPY  2023   CORONARY ARTERY BYPASS GRAFT N/A 05/01/2020   Procedure: CORONARY ARTERY BYPASS GRAFTING (CABG) X 2 ON CARDIOPULMONARY BYPASS. LIMA TO LAD, SVG TO DIAG;  Surgeon: Ivin Poot, MD;   Location: [place];  Service: Open Heart Surgery;  Laterality: N/A;   ENDOVEIN HARVEST OF GREATER SAPHENOUS VEIN Right 05/01/2020   Procedure: ENDOVEIN HARVEST OF GREATER SAPHENOUS VEIN;  Surgeon: Ivin Poot, MD;  Location: [place];  Service: Open Heart Surgery;  Laterality: Right;   KNEE ARTHROSCOPY  [id number]   left and right   LUMBAR LAMINECTOMY/DECOMPRESSION MICRODISCECTOMY Left 09/29/2017   Procedure: LEFT LUMBAR TWO - LUMBAR THREELAMINOTOMY/MICRODISCECTOMY;  Surgeon: Jovita Gamma, MD;  Location: [place];  Service: Neurosurgery;  Laterality: Left;  LEFT LUMBAR 2- LUMBAR 3 LAMINOTOMY/MICRODISCECTOMY   NASAL SINUS SURGERY  05/2015   RIGHT/LEFT HEART CATH AND CORONARY ANGIOGRAPHY N/A 04/10/2020   Procedure: RIGHT/LEFT HEART CATH AND CORONARY ANGIOGRAPHY;  Surgeon: Troy Sine, MD;  Location: [facility] [facility];  Service: Cardiovascular;  Laterality: N/A;   TEE WITHOUT CARDIOVERSION N/A 05/01/2020   Procedure: TRANSESOPHAGEAL ECHOCARDIOGRAM (TEE);  Surgeon: Prescott Gum, Collier Salina, MD;  Location: [place];  Service: Open Heart Surgery;  Laterality: N/A;   TOTAL KNEE ARTHROPLASTY Right 06/24/2016   Procedure: TOTAL KNEE ARTHROPLASTY;  Surgeon: Garald Balding, MD;  Location: [place];  Service: Orthopedics;  Laterality: Right;   TOTAL KNEE ARTHROPLASTY Left 12/18/2020   Procedure: LEFT TOTAL KNEE ARTHROPLASTY;  Surgeon: Garald Balding, MD;  Location: [facility];  Service: Orthopedics;  Laterality: Left;   TRANSFORAMINAL LUMBAR INTERBODY FUSION W/ MIS 2 LEVEL Left 04/15/2022   Procedure: Minimally Invasive Surgery Decompression and Transforaminal Lumbar Interbody Fusion , Left , Lumbar three-four, Lumbar four-five;  Surgeon: Dawley, Theodoro Doing, DO;  Location: [place];  Service: Neurosurgery;  Laterality: Left;   TRIGGER FINGER RELEASE Left 11/10/2013   Procedure: LEFT INDEX A-1 PULLEY RELEASE;  Surgeon: Cammie Sickle, MD;  Location: [place];  Service: Orthopedics;  Laterality: Left;    ULNAR NERVE TRANSPOSITION Left 11/10/2013   Procedure: ULNAR NERVE TRANSPOSITION;  Surgeon: Cammie Sickle, MD;  Location: [place];  Service: Orthopedics;  Laterality: Left;    Allergies  Allergies  Allergen Reactions   Mushroom Extract Complex Anaphylaxis   Penicillins Shortness Of Breath    Childhood allergy.  Has patient had a PCN reaction causing immediate rash, facial/tongue/throat swelling, SOB or lightheadedness with hypotension: No Has patient had a PCN reaction causing severe rash involving mucus membranes or skin necrosis: No Has patient had a PCN reaction that required hospitalization No Has patient had a PCN reaction occurring within the last 10 years: No If all of the above answers are "NO", then may proceed with C   Chantix [Varenicline] Other (See Comments)    headaches    Crestor [Rosuvastatin] Other (See Comments)    myalgia   Dilaudid [Hydromorphone] Nausea And Vomiting   Lipitor [Atorvastatin] Other (See Comments)  myalgia   Losartan Potassium Other (See Comments)    Lethargic    Lovaza [Omega-3-Acid Ethyl Esters] Other (See Comments)    myalgia   Oxycodone Hcl Other (See Comments)    hallucinations   Pravachol [Pravastatin] Other (See Comments)    myalgia   Bystolic [Nebivolol Hcl] Other (See Comments)    Extreme fatigue, not able to focus   Codeine Nausea And Vomiting   Iodine Rash   Iohexol Rash   Lopressor [Metoprolol] Other (See Comments)    Made patient very fatigued, could not focus   Red Dye Rash   Repatha [Evolocumab] Rash and Other (See Comments)    Hip pain and rash    EKGs/Labs/Other Studies Reviewed:   The following studies were reviewed today:  ECHO 04/05/20  IMPRESSIONS     1. Left ventricular ejection fraction, by estimation, is 60 to 65%. The  left ventricle has normal function. The left ventricle has no regional  wall motion abnormalities. Left ventricular diastolic parameters are  consistent with  Grade I diastolic  dysfunction (impaired relaxation).   2. Right ventricular systolic function is normal. The right ventricular  size is normal.   3. The mitral valve is normal in structure. No evidence of mitral valve  regurgitation.   4. The aortic valve is calcified. There is moderate calcification of the  aortic valve. There is moderate thickening of the aortic valve. Aortic  valve regurgitation is not visualized. Mild aortic valve stenosis.   5. Aortic dilatation noted. There is mild to moderate dilatation of the  ascending aorta, measuring 40 mm.   FINDINGS   Left Ventricle: Left ventricular ejection fraction, by estimation, is 60  to 65%. The left ventricle has normal function. The left ventricle has no  regional wall motion abnormalities. The left ventricular internal cavity  size was normal in size. There is   no left ventricular hypertrophy. Left ventricular diastolic parameters  are consistent with Grade I diastolic dysfunction (impaired relaxation).   Right Ventricle: The right ventricular size is normal. No increase in  right ventricular wall thickness. Right ventricular systolic function is  normal.   Left Atrium: Left atrial size was normal in size.   Right Atrium: Right atrial size was normal in size.   Pericardium: There is no evidence of pericardial effusion.   Mitral Valve: The mitral valve is normal in structure. No evidence of  mitral valve regurgitation.   Tricuspid Valve: The tricuspid valve is normal in structure. Tricuspid  valve regurgitation is trivial.   Aortic Valve: The aortic valve is calcified. There is moderate  calcification of the aortic valve. There is moderate thickening of the  aortic valve. There is mild to moderate aortic valve annular  calcification. Aortic valve regurgitation is not  visualized. Aortic regurgitation PHT measures 325 msec. Mild aortic  stenosis is present. Aortic valve mean gradient measures 14.0 mmHg. Aortic  valve  peak gradient measures 29.3 mmHg. Aortic valve area, by VTI measures  1.78 cm.   Pulmonic Valve: The pulmonic valve was grossly normal. Pulmonic valve  regurgitation is not visualized.   Aorta: Aortic dilatation noted. There is mild to moderate dilatation of  the ascending aorta, measuring 40 mm.   IAS/Shunts: The atrial septum is grossly normal.   Carotid US 3/21 Summary:  Right Carotid: Velocities in the right ICA are consistent with a 1-39%  stenosis.                Non-hemodynamically significant plaque <50% noted  in the  CCA.   Left Carotid: Velocities in the left ICA are consistent with a 1-39%  stenosis.               Non-hemodynamically significant plaque <50% noted in the  CCA.   Vertebrals: Left vertebral artery demonstrates antegrade flow. Right  vertebral              artery demonstrates high resistant flow.  Subclavians: Normal flow hemodynamics were seen in bilateral subclavian               arteries.   *See table(s) above for measurements and observations.      EKG:  EKG is not ordered today.    Recent Labs: 10/10/2021: ALT 31 10/16/2021: Pro B Natriuretic peptide (BNP) 50.0 03/13/2022: BUN 14; Creatinine, Ser 0.68; Hemoglobin 15.2; Platelets 194; Potassium 4.5; Sodium 137  Recent Lipid Panel    Component Value Date/Time   CHOL 122 07/22/2021 0855   TRIG 98 07/22/2021 0855   HDL 60 07/22/2021 0855   CHOLHDL 2.0 07/22/2021 0855   LDLCALC 44 07/22/2021 0855    Home Medications   Current Meds  Medication Sig   acetaminophen (TYLENOL) 500 MG tablet Take 500-1,000 mg by mouth every 6 (six) hours as needed (pain.).   Alirocumab (PRALUENT) 75 MG/ML SOAJ Inject 75 mg into the skin every 14 (fourteen) days.   aspirin EC 81 MG tablet Take 1 tablet (81 mg total) by mouth in the morning. Swallow whole.   clobetasol cream (TEMOVATE) 2.69 % Apply 1 Application topically 2 (two) times daily as needed (skin irritation/rash.).   clobetasol cream (TEMOVATE) 0.05 %  Apply to affected areas twice daily as needed for eczema, not to face,groin,or underarms   doxycycline (VIBRA-TABS) 100 MG tablet Take 1 tablet by mouth twice daily for 14 days with food/water. Avoid extensive sunlight while taking this medication.   fluticasone (FLONASE) 50 MCG/ACT nasal spray Place 2 sprays into both nostrils in the morning.   methocarbamol (ROBAXIN) 500 MG tablet Take 1 tablet (500 mg total) by mouth every 6 (six) hours as needed for muscle spasms.   Multiple Vitamin (MULTIVITAMIN WITH MINERALS) TABS tablet Take 1 tablet by mouth in the morning.   mupirocin ointment (BACTROBAN) 2 % Apply to affected areas up to 3 times daily as needed   olmesartan (BENICAR) 5 MG tablet Take 2 tablets (10 mg total) by mouth daily.   oxyCODONE 10 MG TABS Take 1 tablet (10 mg total) by mouth every 4 (four) hours as needed for severe pain ((score 7 to 10)).   Polyethyl Glycol-Propyl Glycol (LUBRICANT EYE DROPS) 0.4-0.3 % SOLN Place 1 drop into both eyes 3 (three) times daily as needed (dry/irritated eyes).   [DISCONTINUED] diltiazem (CARDIZEM CD) 240 MG 24 hr capsule Take 1 capsule (240 mg total) by mouth daily.   [DISCONTINUED] olmesartan (BENICAR) 20 MG tablet Take 1 tablet (20 mg total) by mouth daily.     Review of Systems      All other systems reviewed and are otherwise negative except as noted above.  Physical Exam    VS:  BP 116/74   Pulse (!) 101   Ht '5\' 8"'$  (1.727 m)   Wt 151 lb (68.5 kg)   SpO2 97%   BMI 22.96 kg/m  , BMI Body mass index is 22.96 kg/m.  Wt Readings from Last 3 Encounters:  06/20/22 151 lb (68.5 kg)  04/15/22 155 lb (70.3 kg)  03/13/22 150 lb 6.4 oz (  68.2 kg)     GEN: Well nourished, well developed, in no acute distress. HEENT: normal. Neck: Supple, no JVD, + right carotid bruit, or masses. Cardiac: RRR, 3/6 systolic murmur, rubs, or gallops. No clubbing, cyanosis, edema.  Radials/PT 2+ and equal bilaterally.  Respiratory:  Respirations regular and  unlabored, clear to auscultation bilaterally. GI: Soft, nontender, nondistended. MS: No deformity or atrophy. Skin: Warm and dry, no rash. Neuro:  Strength and sensation are intact. Psych: Normal affect.  Assessment & Plan    Coronary atherosclerosis of native coronary artery -doing well s/p CABG 2021 -He remains on GDMT: ASA '81mg'$ , Diltiazem '240mg'$  daily, Benicar (drop to '10mg'$  daily)  Bicuspid aortic valve -update echo and carotid US -currently aymptomatic   Chronic tachycardia -he is 103 today, sometimes up to 115 -he is asymptomatic  -at one point medication brought his HR down to 80 bpm and he felt horrible -continue Cardizem   Hypertension -well controlled today -decrease Benicar to '10mg'$  daily -please continue to track BP for me at home  Lumbar stenosis with numbness in his feet -he had back surgery back in Nov.  -Therapy at [facility]   6. Hyperlipemia  -lipid, LFTs today -continue Praluent -last LDL 44          Disposition: Follow up 6 months with Candee Furbish, MD or APP.  Signed, Elgie Collard, PA-C 06/20/2022, 9:43 AM [place]

## 2022-06-20 ENCOUNTER — Encounter: Payer: Self-pay | Admitting: Physician Assistant

## 2022-06-20 ENCOUNTER — Other Ambulatory Visit (HOSPITAL_BASED_OUTPATIENT_CLINIC_OR_DEPARTMENT_OTHER): Payer: Self-pay

## 2022-06-20 ENCOUNTER — Ambulatory Visit: Payer: BC Managed Care – PPO | Attending: Physician Assistant | Admitting: Physician Assistant

## 2022-06-20 VITALS — BP 116/74 | HR 101 | Ht 68.0 in | Wt 151.0 lb

## 2022-06-20 DIAGNOSIS — R000 Tachycardia, unspecified: Secondary | ICD-10-CM | POA: Diagnosis not present

## 2022-06-20 DIAGNOSIS — I2510 Atherosclerotic heart disease of native coronary artery without angina pectoris: Secondary | ICD-10-CM

## 2022-06-20 DIAGNOSIS — I6529 Occlusion and stenosis of unspecified carotid artery: Secondary | ICD-10-CM

## 2022-06-20 DIAGNOSIS — I350 Nonrheumatic aortic (valve) stenosis: Secondary | ICD-10-CM

## 2022-06-20 DIAGNOSIS — Q231 Congenital insufficiency of aortic valve: Secondary | ICD-10-CM | POA: Diagnosis not present

## 2022-06-20 DIAGNOSIS — I10 Essential (primary) hypertension: Secondary | ICD-10-CM | POA: Diagnosis not present

## 2022-06-20 DIAGNOSIS — M5126 Other intervertebral disc displacement, lumbar region: Secondary | ICD-10-CM | POA: Diagnosis not present

## 2022-06-20 MED ORDER — OLMESARTAN MEDOXOMIL 5 MG PO TABS
10.0000 mg | ORAL_TABLET | Freq: Every day | ORAL | 3 refills | Status: DC
  Filled 2022-06-20: qty 150, 75d supply, fill #0
  Filled 2022-06-24: qty 30, 15d supply, fill #0

## 2022-06-20 MED ORDER — DILTIAZEM HCL ER COATED BEADS 240 MG PO CP24
240.0000 mg | ORAL_CAPSULE | Freq: Every day | ORAL | 3 refills | Status: DC
  Filled 2022-06-20: qty 90, 90d supply, fill #0

## 2022-06-20 NOTE — Patient Instructions (Addendum)
Medication Instructions:  1.Decrease olmesartan to 10 mg daily, (take two of the 5 mg tablets at once daily) *If you need a refill on your cardiac medications before your next appointment, please call your pharmacy*   Lab Work: Fasting lipids and lft's next week If you have labs (blood work) drawn today and your tests are completely normal, you will receive your results only by: [place] (if you have MyChart) OR A paper copy in the mail If you have any lab test that is abnormal or we need to change your treatment, we will call you to review the results.   Testing/Procedures: Your physician has requested that you have an echocardiogram. Echocardiography is a painless test that uses sound waves to create images of your heart. It provides your doctor with information about the size and shape of your heart and how well your heart's chambers and valves are working. This procedure takes approximately one hour. There are no restrictions for this procedure. Please do NOT wear cologne, perfume, aftershave, or lotions (deodorant is allowed). Please arrive 15 minutes prior to your appointment time.   Your physician has requested that you have a carotid duplex. This test is an ultrasound of the carotid arteries in your neck. It looks at blood flow through these arteries that supply the brain with blood. Allow one hour for this exam. There are no restrictions or special instructions.    Follow-Up: At [facility], you and your health needs are our priority.  As part of our continuing mission to provide you with exceptional heart care, we have created designated Provider Care Teams.  These Care Teams include your primary Cardiologist (physician) and Advanced Practice Providers (APPs -  Physician Assistants and Nurse Practitioners) who all work together to provide you with the care you need, when you need it.   Your next appointment:   5-6 month(s)  Provider:   Candee Furbish, MD

## 2022-06-23 ENCOUNTER — Other Ambulatory Visit (HOSPITAL_BASED_OUTPATIENT_CLINIC_OR_DEPARTMENT_OTHER): Payer: Self-pay

## 2022-06-24 ENCOUNTER — Other Ambulatory Visit (HOSPITAL_BASED_OUTPATIENT_CLINIC_OR_DEPARTMENT_OTHER): Payer: Self-pay

## 2022-06-27 ENCOUNTER — Ambulatory Visit: Payer: BC Managed Care – PPO | Attending: Physician Assistant

## 2022-06-27 ENCOUNTER — Ambulatory Visit (HOSPITAL_BASED_OUTPATIENT_CLINIC_OR_DEPARTMENT_OTHER): Payer: BC Managed Care – PPO | Attending: Family Medicine | Admitting: Physical Therapy

## 2022-06-27 ENCOUNTER — Encounter (HOSPITAL_BASED_OUTPATIENT_CLINIC_OR_DEPARTMENT_OTHER): Payer: Self-pay | Admitting: Physical Therapy

## 2022-06-27 DIAGNOSIS — I10 Essential (primary) hypertension: Secondary | ICD-10-CM

## 2022-06-27 DIAGNOSIS — M5126 Other intervertebral disc displacement, lumbar region: Secondary | ICD-10-CM

## 2022-06-27 DIAGNOSIS — R262 Difficulty in walking, not elsewhere classified: Secondary | ICD-10-CM | POA: Diagnosis not present

## 2022-06-27 DIAGNOSIS — Q231 Congenital insufficiency of aortic valve: Secondary | ICD-10-CM

## 2022-06-27 DIAGNOSIS — I2510 Atherosclerotic heart disease of native coronary artery without angina pectoris: Secondary | ICD-10-CM | POA: Diagnosis not present

## 2022-06-27 DIAGNOSIS — I6529 Occlusion and stenosis of unspecified carotid artery: Secondary | ICD-10-CM | POA: Diagnosis not present

## 2022-06-27 DIAGNOSIS — R000 Tachycardia, unspecified: Secondary | ICD-10-CM

## 2022-06-27 DIAGNOSIS — I350 Nonrheumatic aortic (valve) stenosis: Secondary | ICD-10-CM

## 2022-06-27 DIAGNOSIS — M6281 Muscle weakness (generalized): Secondary | ICD-10-CM | POA: Insufficient documentation

## 2022-06-27 DIAGNOSIS — Q2381 Bicuspid aortic valve: Secondary | ICD-10-CM

## 2022-06-27 DIAGNOSIS — M545 Low back pain, unspecified: Secondary | ICD-10-CM | POA: Insufficient documentation

## 2022-06-27 NOTE — Therapy (Addendum)
OUTPATIENT PHYSICAL THERAPY TREATMENT NOTE   Patient Name: Philip Winters MRN: [id number] DOB:1960/07/29, 62 y.o., male Today's Date: 06/27/2022  END OF SESSION:  PT End of Session - 06/27/22 1156     Visit Number 2    Number of Visits 16    Date for PT Re-Evaluation 09/16/22    Authorization Type [employer]    PT Start Time 1108    PT Stop Time 1144    PT Time Calculation (min) 36 min    Activity Tolerance Patient tolerated treatment well    Behavior During Therapy WFL for tasks assessed/performed              Past Medical History:  Diagnosis Date   Allergy    Arthritis    CAD (coronary artery disease)    GERD (gastroesophageal reflux disease)    occ   Heart murmur    baby   High cholesterol    Hypertension    Peripheral neuropathy    Pneumonia    Right carotid bruit 06/10/2017   Sciatica    Seasonal allergies    Sinus tachycardia    Spinal stenosis    Past Surgical History:  Procedure Laterality Date   CARDIAC CATHETERIZATION     CARPAL TUNNEL WITH CUBITAL TUNNEL Left 11/10/2013   Procedure: LEFT CARPAL TUNNEL RELEASE;  Surgeon: Cammie Sickle, MD;  Location: [place];  Service: Orthopedics;  Laterality: Left;   COLONOSCOPY  2023   CORONARY ARTERY BYPASS GRAFT N/A 05/01/2020   Procedure: CORONARY ARTERY BYPASS GRAFTING (CABG) X 2 ON CARDIOPULMONARY BYPASS. LIMA TO LAD, SVG TO DIAG;  Surgeon: Ivin Poot, MD;  Location: [place];  Service: Open Heart Surgery;  Laterality: N/A;   ENDOVEIN HARVEST OF GREATER SAPHENOUS VEIN Right 05/01/2020   Procedure: ENDOVEIN HARVEST OF GREATER SAPHENOUS VEIN;  Surgeon: Ivin Poot, MD;  Location: [place];  Service: Open Heart Surgery;  Laterality: Right;   KNEE ARTHROSCOPY  [id number]   left and right   LUMBAR LAMINECTOMY/DECOMPRESSION MICRODISCECTOMY Left 09/29/2017   Procedure: LEFT LUMBAR TWO - LUMBAR THREELAMINOTOMY/MICRODISCECTOMY;  Surgeon: Jovita Gamma, MD;  Location: [place];  Service:  Neurosurgery;  Laterality: Left;  LEFT LUMBAR 2- LUMBAR 3 LAMINOTOMY/MICRODISCECTOMY   NASAL SINUS SURGERY  05/2015   RIGHT/LEFT HEART CATH AND CORONARY ANGIOGRAPHY N/A 04/10/2020   Procedure: RIGHT/LEFT HEART CATH AND CORONARY ANGIOGRAPHY;  Surgeon: Troy Sine, MD;  Location: [facility];  Service: Cardiovascular;  Laterality: N/A;   TEE WITHOUT CARDIOVERSION N/A 05/01/2020   Procedure: TRANSESOPHAGEAL ECHOCARDIOGRAM (TEE);  Surgeon: Prescott Gum, Collier Salina, MD;  Location: [place];  Service: Open Heart Surgery;  Laterality: N/A;   TOTAL KNEE ARTHROPLASTY Right 06/24/2016   Procedure: TOTAL KNEE ARTHROPLASTY;  Surgeon: Garald Balding, MD;  Location: [facility];  Service: Orthopedics;  Laterality: Right;   TOTAL KNEE ARTHROPLASTY Left 12/18/2020   Procedure: LEFT TOTAL KNEE ARTHROPLASTY;  Surgeon: Garald Balding, MD;  Location: [facility];  Service: Orthopedics;  Laterality: Left;   TRANSFORAMINAL LUMBAR INTERBODY FUSION W/ MIS 2 LEVEL Left 04/15/2022   Procedure: Minimally Invasive Surgery Decompression and Transforaminal Lumbar Interbody Fusion , Left , Lumbar three-four, Lumbar four-five;  Surgeon: Dawley, Theodoro Doing, DO;  Location: [place];  Service: Neurosurgery;  Laterality: Left;   TRIGGER FINGER RELEASE Left 11/10/2013   Procedure: LEFT INDEX A-1 PULLEY RELEASE;  Surgeon: Cammie Sickle, MD;  Location: [place];  Service: Orthopedics;  Laterality: Left;  ULNAR NERVE TRANSPOSITION Left 11/10/2013   Procedure: ULNAR NERVE TRANSPOSITION;  Surgeon: Cammie Sickle, MD;  Location: [place];  Service: Orthopedics;  Laterality: Left;   Patient Active Problem List   Diagnosis Date Noted   Lumbar spinal stenosis 04/15/2022   COVID-19 03/27/2022   Chronic pain of left thumb 02/25/2022   SBE (subacute bacterial endocarditis) prophylaxis candidate 12/16/2021   Trigger finger, left ring finger 11/06/2021   Cervical radiculopathy 08/13/2021   Ulnar neuropathy  08/13/2021   Screen for colon cancer 06/06/2021   Chronic tachycardia 06/04/2021   Candidal balanitis 06/04/2021   Carpal tunnel syndrome, bilateral upper limbs 09/26/2020   Cubital tunnel syndrome on left 08/23/2020   Pulmonary nodule 1 cm or greater in diameter 04/26/2020   Statin intolerance 04/20/2020   Coronary atherosclerosis of native coronary artery 04/18/2020   Nonrheumatic aortic valve stenosis    Bicuspid aortic valve 04/06/2020   Primary osteoarthritis of left knee 11/08/2019   Bilateral primary osteoarthritis of knee 09/28/2019   HNP (herniated nucleus pulposus), lumbar 09/29/2017   Unilateral primary osteoarthritis, right knee 06/24/2016   Primary hypertension 01/18/2007    PCP: Janith Lima, MD   REFERRING PROVIDER: Noralee Stain, MD   REFERRING DIAG: [phone] (ICD-10-CM) - Spinal stenosis, lumbar region without neurogenic claudication   Rationale for Evaluation and Treatment: Rehabilitation  THERAPY DIAG:  Pain, lumbar region  Muscle weakness (generalized)  Difficulty walking  ONSET DATE: 04/15/22   Minimally invasive L 3-4, L4-5 decompression and transforaminal lumbar interbody fusion and arthrodesis, left sided approach Bilateral L3, L4, L5 segmental pedicle screw instrumentation, K2 M 7.5 x 50 mm bilaterally at L3, L4, L5 Placement of anterior biomechanical device, L3-4: [place] titanium K2 M interbody 10 x 28 mm; L4-5: [place] titanium K2 M interbody 28 x 14 mm Intraoperative use of autograft, same incision Intraoperative use of allograft  Intraoperative use of fluoroscopy, greater than 1 hour Intraoperative use of microscope, for microdissection    SUBJECTIVE:                                                                                                                                                                                           SUBJECTIVE STATEMENT: Pt is 10 weeks and 3 days post op.  Pt has pain and stiffness 1st  thing in AM.  Pt has pain with sitting and has difficulty driving.  Pt states he's not doing too good this AM.  He states it's going to rain and that makes him feel worse.  Pt started walking his dog this week and has felt ok doing that.  Pt  denies any adverse effects after prior Rx.  Pt states MD removed his back precautions though informed him to be careful.  Pt reports compliance with HEP.  Pt states the ball feels good to his back.    PERTINENT HISTORY:  Bilat TKA, double cardiac bypass surgery in 2021  PAIN:  Are you having pain? Yes: NPRS scale: 7-8/10 Pain location: L L/S Pain description: sharp/ stabbing Aggravating factors: sitting still, bending, towards the L Relieving factors: standing/walking, muscle relaxers, heating  PRECAUTIONS: Back  WEIGHT BEARING RESTRICTIONS: No  FALLS:  Has patient fallen in last 6 months? 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  L4-5 also shows severe left and moderate right foraminal narrowing and moderate posterior canal narrowing and possible encroachment on the exiting nerve root on the left.  L2/3 shows postoperative changes with mild posterior canal and bilateral foraminal narrowing as well.      TODAY'S TREATMENT:                                                                                                                                Reviewed pt presentation, response to prior Rx, HEP compliance, and pain levels.  Reviewed HEP. Pt educated in correct performance and palpation of TrA contraction. Pt performed:  Seated QL stretch 2x30 sec bilat  Supine PPT 2x10  Supine TrA contraction with 5 sec hold  Supine marching with TrA 2x10  Supine alt LE extension with TrA 2x10 Pt received supine manual HS stretch 2x20-30 sec bilat   PT updated HEP and gave pt a HEP handout.  Educated pt in correct form and appropriate frequency.  PT instructed pt he should not have pain with HEP.   Manual Therapy: Gentle STM to left sided lumbar paraspinals, not on incision, and right side-lying with pillow between knees.  Patient also had tenderness in left glute and PT performed gentle STM to left glute.     PATIENT EDUCATION:  Education details:  diagnosis, prognosis, precautions, anatomy, exercise form, HEP, and POC.  PT answered pt's questions. Person educated: Patient Education method: Explanation, Demonstration, Tactile cues, Verbal cues, and Handouts Education comprehension: verbalized understanding, returned demonstration, verbal cues required, and tactile cues required  HOME EXERCISE PROGRAM:  Access Code: [email or web address] URL: [email or web address] Date: 06/18/2022 Prepared by: [name]  Updated HEP: - Supine Transversus Abdominis Bracing - Hands on  Stomach  - 2 x daily - 7 x weekly - 1-2 sets - 10 reps - 5 seconds hold - Supine March  - 1 x daily - 7 x weekly - 2 sets - 10 reps - Supine Core Control with Leg Extension  - 1 x daily - 7 x weekly - 2 sets - 10 reps  ASSESSMENT:  CLINICAL IMPRESSION: PT reviewed HEP and updated HEP.  Pt has been compliant with HEP.  PT focused on core exercises and educated pt with TrA contractions.  Pt able to perform TrA contractions well with cuing.  PT updated HEP and gave pt a HEP handout.  Pt demonstrates good understanding of HEP.  He performed exercises well without  back pain.  Patient does have soft tissue tightness and left-sided lumbar paraspinals and some tenderness in left glute.  Patient responded well to treatment stating he felt better after treatment then before treatment and reporting improved pain to 5-6/10 pain after Rx.  Pt should benefit from cont skilled PT services to address impairments and goals and to improve overall function.   OBJECTIVE IMPAIRMENTS: Abnormal gait, decreased activity tolerance, decreased endurance, decreased mobility, difficulty walking, decreased ROM, decreased strength, hypomobility, increased fascial restrictions, increased muscle spasms, impaired flexibility, impaired sensation, improper body mechanics, postural dysfunction, and pain.   ACTIVITY LIMITATIONS: carrying, lifting, bending, sitting, standing, squatting, stairs, transfers, and locomotion level  PARTICIPATION LIMITATIONS: cleaning, laundry, interpersonal relationship, driving, shopping, community activity, yard work, and exercise  PERSONAL FACTORS: Age, Time since onset of injury/illness/exacerbation, and 1-2 comorbidities:    are also affecting patient's functional outcome.   REHAB POTENTIAL: Good  CLINICAL DECISION MAKING: Stable/uncomplicated  EVALUATION COMPLEXITY: Low   GOALS:   SHORT TERM GOALS: Target date: 07/30/2022   Pt will become independent with HEP in order to demonstrate synthesis of PT education.   Goal status: INITIAL  2.  Pt will report at least 2 pt reduction on NPRS scale for pain in order to demonstrate functional improvement with household activity, self care, and ADL.   Goal status: INITIAL  3.  Pt will score at least 5 pt increase on FOTO to demonstrate functional improvement in MCII and pt perceived function.   Goal status: INITIAL   LONG TERM GOALS: Target date: 09/10/2022   Pt  will become independent with final HEP in order to demonstrate synthesis of PT education.  Goal status: INITIAL  2.  Pt will score >/=  66 on FOTO to demonstrate improvement in perceived lumbar function.   Goal status: INITIAL  3.  Pt will be able to demonstrate/report ability to walk >30 mins without pain in order to demonstrate functional improvement and tolerance to exercise and community mobility.   Goal status: INITIAL  4.  Pt will be able to demonstrate DL squat with >/=10 lbs in order to demonstrate functional improvement in bilat LE strength for return to ADL and light exercise.   Goal status: INITIAL  PLAN:  PT FREQUENCY: 1x/week  PT DURATION: 12 weeks (likely D/C by 8 wks)  PLANNED INTERVENTIONS: Therapeutic exercises, Therapeutic activity, Neuromuscular re-education, Balance training, Gait training, Patient/Family education, Self Care, Joint mobilization, Joint manipulation, Stair training, Orthotic/Fit training, DME instructions, Aquatic Therapy, Dry Needling, Electrical stimulation, Spinal manipulation, Spinal mobilization, Cryotherapy, Moist heat, scar mobilization, Splintting, Taping, Vasopneumatic device, Traction, Ultrasound, Ionotophoresis 70m/ml Dexamethasone, Manual therapy, and Re-evaluation.  PLAN FOR NEXT SESSION: STM, TPDN PRN with lumbar fusion and past laminectomy in mind, lumbar and hip stretching, progressive LE and hip  strength.  Cont per lumbar fusion protocol.     Selinda Michaels III PT, DPT 06/27/22 1:10 PM

## 2022-06-28 LAB — LIPID PANEL
Chol/HDL Ratio: 1.9 ratio (ref 0.0–5.0)
Cholesterol, Total: 122 mg/dL (ref 100–199)
HDL: 64 mg/dL (ref >39)
LDL Chol Calc (NIH): 44 mg/dL (ref 0–99)
Triglycerides: 68 mg/dL (ref 0–149)
VLDL Cholesterol Cal: 14 mg/dL (ref 5–40)

## 2022-06-28 LAB — HEPATIC FUNCTION PANEL
ALT: 20 IU/L (ref 0–44)
AST: 24 IU/L (ref 0–40)
Albumin: 4.6 g/dL (ref 3.9–4.9)
Alkaline Phosphatase: 129 IU/L — ABNORMAL HIGH (ref 44–121)
Bilirubin Total: 0.3 mg/dL (ref 0.0–1.2)
Bilirubin, Direct: 0.14 mg/dL (ref 0.00–0.40)
Total Protein: 7.1 g/dL (ref 6.0–8.5)

## 2022-07-03 ENCOUNTER — Other Ambulatory Visit: Payer: Self-pay

## 2022-07-03 ENCOUNTER — Other Ambulatory Visit (HOSPITAL_BASED_OUTPATIENT_CLINIC_OR_DEPARTMENT_OTHER): Payer: Self-pay

## 2022-07-03 ENCOUNTER — Ambulatory Visit (HOSPITAL_BASED_OUTPATIENT_CLINIC_OR_DEPARTMENT_OTHER): Payer: BC Managed Care – PPO | Admitting: Physical Therapy

## 2022-07-04 ENCOUNTER — Other Ambulatory Visit (HOSPITAL_BASED_OUTPATIENT_CLINIC_OR_DEPARTMENT_OTHER): Payer: Self-pay

## 2022-07-04 MED FILL — Alirocumab Subcutaneous Solution Auto-Injector 75 MG/ML: SUBCUTANEOUS | 28 days supply | Qty: 2 | Fill #1 | Status: AC

## 2022-07-07 ENCOUNTER — Other Ambulatory Visit (HOSPITAL_BASED_OUTPATIENT_CLINIC_OR_DEPARTMENT_OTHER): Payer: Self-pay

## 2022-07-10 ENCOUNTER — Ambulatory Visit (INDEPENDENT_AMBULATORY_CARE_PROVIDER_SITE_OTHER): Payer: BC Managed Care – PPO

## 2022-07-10 ENCOUNTER — Ambulatory Visit (HOSPITAL_BASED_OUTPATIENT_CLINIC_OR_DEPARTMENT_OTHER): Payer: BC Managed Care – PPO | Admitting: Physical Therapy

## 2022-07-10 DIAGNOSIS — I350 Nonrheumatic aortic (valve) stenosis: Secondary | ICD-10-CM | POA: Diagnosis not present

## 2022-07-10 DIAGNOSIS — I6523 Occlusion and stenosis of bilateral carotid arteries: Secondary | ICD-10-CM | POA: Diagnosis not present

## 2022-07-10 DIAGNOSIS — I6529 Occlusion and stenosis of unspecified carotid artery: Secondary | ICD-10-CM

## 2022-07-10 LAB — ECHOCARDIOGRAM COMPLETE
AR max vel: 1.15 cm2
AV Area VTI: 1.06 cm2
AV Area mean vel: 1.16 cm2
AV Mean grad: 26 mmHg
AV Peak grad: 47.3 mmHg
Ao pk vel: 3.44 m/s
Area-P 1/2: 4.57 cm2
P 1/2 time: 249 msec
S' Lateral: 2.76 cm

## 2022-07-16 ENCOUNTER — Encounter (HOSPITAL_BASED_OUTPATIENT_CLINIC_OR_DEPARTMENT_OTHER): Payer: Self-pay | Admitting: Physical Therapy

## 2022-07-16 ENCOUNTER — Ambulatory Visit (HOSPITAL_BASED_OUTPATIENT_CLINIC_OR_DEPARTMENT_OTHER): Payer: BC Managed Care – PPO | Admitting: Physical Therapy

## 2022-07-16 DIAGNOSIS — M6281 Muscle weakness (generalized): Secondary | ICD-10-CM

## 2022-07-16 DIAGNOSIS — M545 Low back pain, unspecified: Secondary | ICD-10-CM | POA: Diagnosis not present

## 2022-07-16 DIAGNOSIS — R262 Difficulty in walking, not elsewhere classified: Secondary | ICD-10-CM

## 2022-07-16 NOTE — Therapy (Signed)
OUTPATIENT PHYSICAL THERAPY TREATMENT NOTE   Patient Name: Philip Winters MRN: [id number] DOB:Feb 14, 1961, 62 y.o., male Today's Date: 07/16/2022  END OF SESSION:  PT End of Session - 07/16/22 1341     Visit Number 3    Number of Visits 16    Date for PT Re-Evaluation 09/16/22    Authorization Type [employer]    PT Start Time 1301    PT Stop Time 1341    PT Time Calculation (min) 40 min    Activity Tolerance Patient tolerated treatment well    Behavior During Therapy [facility] for tasks assessed/performed               Past Medical History:  Diagnosis Date   Allergy    Arthritis    CAD (coronary artery disease)    GERD (gastroesophageal reflux disease)    occ   Heart murmur    baby   High cholesterol    Hypertension    Peripheral neuropathy    Pneumonia    Right carotid bruit 06/10/2017   Sciatica    Seasonal allergies    Sinus tachycardia    Spinal stenosis    Past Surgical History:  Procedure Laterality Date   CARDIAC CATHETERIZATION     CARPAL TUNNEL WITH CUBITAL TUNNEL Left 11/10/2013   Procedure: LEFT CARPAL TUNNEL RELEASE;  Surgeon: Cammie Sickle, MD;  Location: [place];  Service: Orthopedics;  Laterality: Left;   COLONOSCOPY  2023   CORONARY ARTERY BYPASS GRAFT N/A 05/01/2020   Procedure: CORONARY ARTERY BYPASS GRAFTING (CABG) X 2 ON CARDIOPULMONARY BYPASS. LIMA TO LAD, SVG TO DIAG;  Surgeon: Ivin Poot, MD;  Location: [place];  Service: Open Heart Surgery;  Laterality: N/A;   ENDOVEIN HARVEST OF GREATER SAPHENOUS VEIN Right 05/01/2020   Procedure: ENDOVEIN HARVEST OF GREATER SAPHENOUS VEIN;  Surgeon: Ivin Poot, MD;  Location: [place];  Service: Open Heart Surgery;  Laterality: Right;   KNEE ARTHROSCOPY  [id number]   left and right   LUMBAR LAMINECTOMY/DECOMPRESSION MICRODISCECTOMY Left 09/29/2017   Procedure: LEFT LUMBAR TWO - LUMBAR THREELAMINOTOMY/MICRODISCECTOMY;  Surgeon: Jovita Gamma, MD;  Location: [place];  Service:  Neurosurgery;  Laterality: Left;  LEFT LUMBAR 2- LUMBAR 3 LAMINOTOMY/MICRODISCECTOMY   NASAL SINUS SURGERY  05/2015   RIGHT/LEFT HEART CATH AND CORONARY ANGIOGRAPHY N/A 04/10/2020   Procedure: RIGHT/LEFT HEART CATH AND CORONARY ANGIOGRAPHY;  Surgeon: Troy Sine, MD;  Location: [place] [facility];  Service: Cardiovascular;  Laterality: N/A;   TEE WITHOUT CARDIOVERSION N/A 05/01/2020   Procedure: TRANSESOPHAGEAL ECHOCARDIOGRAM (TEE);  Surgeon: Prescott Gum, Collier Salina, MD;  Location: [place];  Service: Open Heart Surgery;  Laterality: N/A;   TOTAL KNEE ARTHROPLASTY Right 06/24/2016   Procedure: TOTAL KNEE ARTHROPLASTY;  Surgeon: Garald Balding, MD;  Location: [place];  Service: Orthopedics;  Laterality: Right;   TOTAL KNEE ARTHROPLASTY Left 12/18/2020   Procedure: LEFT TOTAL KNEE ARTHROPLASTY;  Surgeon: Garald Balding, MD;  Location: [facility];  Service: Orthopedics;  Laterality: Left;   TRANSFORAMINAL LUMBAR INTERBODY FUSION W/ MIS 2 LEVEL Left 04/15/2022   Procedure: Minimally Invasive Surgery Decompression and Transforaminal Lumbar Interbody Fusion , Left , Lumbar three-four, Lumbar four-five;  Surgeon: Dawley, Theodoro Doing, DO;  Location: [place];  Service: Neurosurgery;  Laterality: Left;   TRIGGER FINGER RELEASE Left 11/10/2013   Procedure: LEFT INDEX A-1 PULLEY RELEASE;  Surgeon: Cammie Sickle, MD;  Location: [place];  Service: Orthopedics;  Laterality:  Left;   ULNAR NERVE TRANSPOSITION Left 11/10/2013   Procedure: ULNAR NERVE TRANSPOSITION;  Surgeon: Cammie Sickle, MD;  Location: [place];  Service: Orthopedics;  Laterality: Left;   Patient Active Problem List   Diagnosis Date Noted   Lumbar spinal stenosis 04/15/2022   COVID-19 03/27/2022   Chronic pain of left thumb 02/25/2022   SBE (subacute bacterial endocarditis) prophylaxis candidate 12/16/2021   Trigger finger, left ring finger 11/06/2021   Cervical radiculopathy 08/13/2021   Ulnar neuropathy  08/13/2021   Screen for colon cancer 06/06/2021   Chronic tachycardia 06/04/2021   Candidal balanitis 06/04/2021   Carpal tunnel syndrome, bilateral upper limbs 09/26/2020   Cubital tunnel syndrome on left 08/23/2020   Pulmonary nodule 1 cm or greater in diameter 04/26/2020   Statin intolerance 04/20/2020   Coronary atherosclerosis of native coronary artery 04/18/2020   Nonrheumatic aortic valve stenosis    Bicuspid aortic valve 04/06/2020   Primary osteoarthritis of left knee 11/08/2019   Bilateral primary osteoarthritis of knee 09/28/2019   HNP (herniated nucleus pulposus), lumbar 09/29/2017   Unilateral primary osteoarthritis, right knee 06/24/2016   Primary hypertension 01/18/2007    PCP: Janith Lima, MD   REFERRING PROVIDER: Noralee Stain, MD   REFERRING DIAG: [phone] (ICD-10-CM) - Spinal stenosis, lumbar region without neurogenic claudication   Rationale for Evaluation and Treatment: Rehabilitation  THERAPY DIAG:  Pain, lumbar region  Muscle weakness (generalized)  Difficulty walking  ONSET DATE: 04/15/22   Minimally invasive L 3-4, L4-5 decompression and transforaminal lumbar interbody fusion and arthrodesis, left sided approach Bilateral L3, L4, L5 segmental pedicle screw instrumentation, K2 M 7.5 x 50 mm bilaterally at L3, L4, L5 Placement of anterior biomechanical device, L3-4: [place] titanium K2 M interbody 10 x 28 mm; L4-5: [place] titanium K2 M interbody 28 x 14 mm Intraoperative use of autograft, same incision Intraoperative use of allograft  Intraoperative use of fluoroscopy, greater than 1 hour Intraoperative use of microscope, for microdissection    SUBJECTIVE:                                                                                                                                                                                           SUBJECTIVE STATEMENT: Pt states he is doing well. He is moving and stretching much better.  The HEP is too easy at this point. "I feel like I am past what I am given."  PERTINENT HISTORY:  Bilat TKA, double cardiac bypass surgery in 2021  PAIN:  Are you having pain? Yes: NPRS scale: 5/10 Pain location: L L/S Pain description: sharp/ stabbing Aggravating factors: sitting  still, bending, towards the L Relieving factors: standing/walking, muscle relaxers, heating  PRECAUTIONS: Back  WEIGHT BEARING RESTRICTIONS: No  FALLS:  Has patient fallen in last 6 months? 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  L4-5 also shows severe left and moderate right foraminal narrowing and moderate posterior canal narrowing and possible encroachment on the exiting nerve root on the left.  L2/3 shows postoperative changes with mild posterior canal and bilateral foraminal narrowing as well.      TODAY'S TREATMENT:       2/28  STM bilat L2-5 paraspinals; bilat QL   Exercises Supine deadbug with red ball press 2x10 - Seated Quadratus Lumborum Stretch in Chair  - 2 x daily - 7 x weekly - 1 sets - 3 reps - 30 hold - Seated Flexion Stretch with Swiss Ball  - 1 x daily - 7 x weekly - 2 sets - 10 reps - 5 hold - Standing Quadratus Lumborum Mobilization with Small Ball on Wall  - 1 x daily - 7 x weekly - 1 sets - 1 reps - 2-5 mins hold - RDL/Half Deadlift  - 1 x daily - 2-3 x weekly - 3  sets - 8 reps - Sit to Stand with Resistance Around Legs  - 1 x daily - 2-3 x weekly - 3 sets - 8 reps - Full Leg Press  - 1 x daily - 2-3 x weekly - 3 sets - 8 reps - Standing Anti-Rotation Press with Anchored Resistance  - 1 x daily - 2-3 x weekly - 3 sets - 8 reps                Previous:                                                                                                                Reviewed pt presentation, response to prior Rx, HEP compliance, and pain levels.  Reviewed HEP. Pt educated in correct performance and palpation of TrA contraction. Pt performed:  Seated QL stretch 2x30 sec bilat  Supine PPT 2x10  Supine TrA contraction with 5 sec hold  Supine marching with TrA 2x10  Supine alt LE extension with TrA 2x10 Pt received supine manual HS stretch 2x20-30 sec bilat   PT updated HEP and gave pt a HEP handout.  Educated pt in correct form and appropriate frequency.  PT instructed pt he should not have pain with HEP.  Manual Therapy: Gentle STM to left sided lumbar paraspinals, not on incision, and right side-lying with pillow between knees.  Patient also had tenderness in left glute and PT performed gentle STM to left glute.     PATIENT EDUCATION:  Education details: anatomy, exercise form, HEP, and POC Person educated: Patient Education method: Explanation, Demonstration, Tactile cues, Verbal cues, and Handouts Education comprehension: verbalized understanding, returned demonstration, verbal cues required, and tactile cues required  HOME EXERCISE PROGRAM:  Access Code: [email or web address] URL: [email or web address] Date: 06/18/2022 Prepared by: [name]  Updated HEP: - Supine Transversus Abdominis Bracing - Hands on Stomach  - 2 x daily - 7 x weekly - 1-2 sets - 10 reps - 5 seconds hold - Supine March  - 1 x daily - 7 x weekly - 2 sets - 10 reps - Supine Core Control with Leg Extension  - 1 x daily - 7 x weekly - 2 sets - 10  reps  ASSESSMENT:  CLINICAL IMPRESSION: Pt without increase in baseline pain during session and able to progress all exercise today for improvement in lumbopelvic strength and stability. Pt with expected fatigue deficits at the abdominals/"core" as well as proximal hip. Pt with good tolerance to all exercise. Pt to decrease frequency of visits for transition to gym based program. Plan to refer to personal training at D/C visit.  Pt should benefit from cont skilled PT services to address impairments and goals and to improve overall function.   OBJECTIVE IMPAIRMENTS: Abnormal gait, decreased activity tolerance, decreased endurance, decreased mobility, difficulty walking, decreased ROM, decreased strength, hypomobility, increased fascial restrictions, increased muscle spasms, impaired flexibility, impaired sensation, improper body mechanics, postural dysfunction, and pain.   ACTIVITY LIMITATIONS: carrying, lifting, bending, sitting, standing, squatting, stairs, transfers, and locomotion level  PARTICIPATION LIMITATIONS: cleaning, laundry, interpersonal relationship, driving, shopping, community activity, yard work, and exercise  PERSONAL FACTORS: Age, Time since onset of injury/illness/exacerbation, and 1-2 comorbidities:    are also affecting patient's functional outcome.   REHAB POTENTIAL: Good  CLINICAL DECISION MAKING: Stable/uncomplicated  EVALUATION COMPLEXITY: Low   GOALS:   SHORT TERM GOALS: Target date: 07/30/2022   Pt will become independent with HEP in order to demonstrate synthesis of PT education.   Goal status: INITIAL  2.  Pt will report at least 2 pt reduction on NPRS scale for pain in order to demonstrate functional improvement with household activity, self care, and ADL.   Goal status: INITIAL  3.  Pt will score at least 5 pt increase on FOTO to demonstrate functional improvement in MCII and pt perceived function.   Goal status: INITIAL   LONG TERM GOALS: Target  date: 09/10/2022   Pt  will become independent with final HEP in order to demonstrate synthesis of PT education.  Goal status: INITIAL  2.  Pt will score >/= 66 on FOTO to demonstrate improvement in perceived lumbar function.   Goal status: INITIAL  3.  Pt will be able to demonstrate/report ability to walk >30 mins without pain in order to demonstrate functional improvement and tolerance to exercise and community mobility.   Goal status: INITIAL  4.  Pt will be able to demonstrate DL squat with >/=10 lbs in order to demonstrate functional improvement in bilat LE strength for return to ADL and light exercise.   Goal status: INITIAL  PLAN:  PT FREQUENCY: 1x/week  PT DURATION: 12 weeks (likely D/C by 8 wks)  PLANNED INTERVENTIONS: Therapeutic exercises, Therapeutic activity, Neuromuscular re-education, Balance training, Gait training, Patient/Family  education, Self Care, Joint mobilization, Joint manipulation, Stair training, Orthotic/Fit training, DME instructions, Aquatic Therapy, Dry Needling, Electrical stimulation, Spinal manipulation, Spinal mobilization, Cryotherapy, Moist heat, scar mobilization, Splintting, Taping, Vasopneumatic device, Traction, Ultrasound, Ionotophoresis '4mg'$ /ml Dexamethasone, Manual therapy, and Re-evaluation.  PLAN FOR NEXT SESSION: STM, TPDN PRN with lumbar fusion and past laminectomy in mind, lumbar and hip stretching, progressive LE and hip strength.  Cont per lumbar fusion protocol.    [name] PT, DPT 07/16/22 1:45 PM

## 2022-07-17 ENCOUNTER — Ambulatory Visit (INDEPENDENT_AMBULATORY_CARE_PROVIDER_SITE_OTHER): Payer: BC Managed Care – PPO | Admitting: Family Medicine

## 2022-07-17 ENCOUNTER — Other Ambulatory Visit (HOSPITAL_BASED_OUTPATIENT_CLINIC_OR_DEPARTMENT_OTHER): Payer: Self-pay

## 2022-07-17 ENCOUNTER — Encounter: Payer: Self-pay | Admitting: Family Medicine

## 2022-07-17 ENCOUNTER — Ambulatory Visit (HOSPITAL_BASED_OUTPATIENT_CLINIC_OR_DEPARTMENT_OTHER): Payer: BC Managed Care – PPO | Admitting: Physical Therapy

## 2022-07-17 VITALS — BP 122/78 | HR 97 | Temp 97.6°F | Ht 68.0 in | Wt 149.0 lb

## 2022-07-17 DIAGNOSIS — R051 Acute cough: Secondary | ICD-10-CM

## 2022-07-17 DIAGNOSIS — J0140 Acute pansinusitis, unspecified: Secondary | ICD-10-CM

## 2022-07-17 MED ORDER — BENZONATATE 200 MG PO CAPS
200.0000 mg | ORAL_CAPSULE | Freq: Two times a day (BID) | ORAL | 0 refills | Status: DC | PRN
  Filled 2022-07-17: qty 20, 10d supply, fill #0

## 2022-07-17 MED ORDER — AZITHROMYCIN 250 MG PO TABS
ORAL_TABLET | ORAL | 0 refills | Status: AC
  Filled 2022-07-17: qty 6, 5d supply, fill #0

## 2022-07-17 NOTE — Patient Instructions (Signed)
Take the azithromycin as prescribed.  Drink plenty of water.  Continue Mucinex.  I also prescribed Tessalon Perles for cough.  Use salt water gargles for sore throat.  Tylenol for headache or facial pain.  You may also try Flonase nasal spray over-the-counter if you have severe nasal congestion  If you develop worsening chest pain, shortness of breath or any other symptoms, you will need to be seen

## 2022-07-17 NOTE — Progress Notes (Signed)
Subjective:  Philip Winters is a 62 y.o. male who presents for a 2 wk hx of URI symptoms.   Nasal congestion and purulent drainage. ST and cough. Mild chest tightness and shortness of breath.   Taking Mucinex and OTC cough syrup.   Denies fever, chills, body aches, dizziness,    ROS as in subjective.   Objective: Vitals:   07/17/22 1557  BP: 122/78  Pulse: 97  Temp: 97.6 F (36.4 C)  SpO2: 98%    General appearance: Alert, WD/WN, no distress, mildly ill appearing                             Skin: warm, no rash                           Head: sinus tenderness                            Eyes: conjunctiva normal, corneas clear, PERRLA                            Ears: pearly TMs, external ear canals normal                          Nose: septum midline, turbinates swollen, with erythema and clear discharge             Mouth/throat: MMM, tongue normal, mild pharyngeal erythema                           Neck: supple, no adenopathy, no thyromegaly, nontender                          Heart: RRR                         Lungs: CTA bilaterally, no wheezes, rales, or rhonchi      Assessment: Acute non-recurrent pansinusitis - Plan: azithromycin (ZITHROMAX) 250 MG tablet  Acute cough - Plan: benzonatate (TESSALON) 200 MG capsule   Plan: Multiple allergies to antibiotics. He is certain he can take a Z-pak. Azithromycin and Tessalon prescribed.  Suggested symptomatic OTC remedies. Nasal saline spray for congestion.  Tylenol or Ibuprofen OTC for fever and malaise.  Call/return if symptoms aren't resolving.

## 2022-07-24 ENCOUNTER — Ambulatory Visit (HOSPITAL_BASED_OUTPATIENT_CLINIC_OR_DEPARTMENT_OTHER): Payer: BC Managed Care – PPO | Admitting: Physical Therapy

## 2022-07-25 DIAGNOSIS — L408 Other psoriasis: Secondary | ICD-10-CM | POA: Diagnosis not present

## 2022-07-25 DIAGNOSIS — L400 Psoriasis vulgaris: Secondary | ICD-10-CM | POA: Diagnosis not present

## 2022-07-25 DIAGNOSIS — L308 Other specified dermatitis: Secondary | ICD-10-CM | POA: Diagnosis not present

## 2022-07-31 ENCOUNTER — Ambulatory Visit (HOSPITAL_BASED_OUTPATIENT_CLINIC_OR_DEPARTMENT_OTHER): Payer: BC Managed Care – PPO | Attending: Family Medicine | Admitting: Physical Therapy

## 2022-07-31 ENCOUNTER — Encounter (HOSPITAL_BASED_OUTPATIENT_CLINIC_OR_DEPARTMENT_OTHER): Payer: Self-pay | Admitting: Physical Therapy

## 2022-07-31 DIAGNOSIS — M545 Low back pain, unspecified: Secondary | ICD-10-CM | POA: Diagnosis not present

## 2022-07-31 DIAGNOSIS — R262 Difficulty in walking, not elsewhere classified: Secondary | ICD-10-CM | POA: Diagnosis not present

## 2022-07-31 DIAGNOSIS — R600 Localized edema: Secondary | ICD-10-CM | POA: Diagnosis not present

## 2022-07-31 DIAGNOSIS — M6281 Muscle weakness (generalized): Secondary | ICD-10-CM | POA: Diagnosis not present

## 2022-07-31 NOTE — Therapy (Signed)
OUTPATIENT PHYSICAL THERAPY TREATMENT NOTE   Patient Name: Philip Winters MRN: [id number] DOB:1960/06/20, 62 y.o., male Today's Date: 07/31/2022  END OF SESSION:  PT End of Session - 07/31/22 1015     Visit Number 4    Number of Visits 16    Date for PT Re-Evaluation 09/16/22    Authorization Type BCBS    PT Start Time 0933    PT Stop Time 1012    PT Time Calculation (min) 39 min    Activity Tolerance Patient tolerated treatment well    Behavior During Therapy [facility] for tasks assessed/performed                Past Medical History:  Diagnosis Date   Allergy    Arthritis    CAD (coronary artery disease)    GERD (gastroesophageal reflux disease)    occ   Heart murmur    baby   High cholesterol    Hypertension    Peripheral neuropathy    Pneumonia    Right carotid bruit 06/10/2017   Sciatica    Seasonal allergies    Sinus tachycardia    Spinal stenosis    Past Surgical History:  Procedure Laterality Date   CARDIAC CATHETERIZATION     CARPAL TUNNEL WITH CUBITAL TUNNEL Left 11/10/2013   Procedure: LEFT CARPAL TUNNEL RELEASE;  Surgeon: Cammie Sickle, MD;  Location: [place];  Service: Orthopedics;  Laterality: Left;   COLONOSCOPY  2023   CORONARY ARTERY BYPASS GRAFT N/A 05/01/2020   Procedure: CORONARY ARTERY BYPASS GRAFTING (CABG) X 2 ON CARDIOPULMONARY BYPASS. LIMA TO LAD, SVG TO DIAG;  Surgeon: Ivin Poot, MD;  Location: [place];  Service: Open Heart Surgery;  Laterality: N/A;   ENDOVEIN HARVEST OF GREATER SAPHENOUS VEIN Right 05/01/2020   Procedure: ENDOVEIN HARVEST OF GREATER SAPHENOUS VEIN;  Surgeon: Ivin Poot, MD;  Location: [place];  Service: Open Heart Surgery;  Laterality: Right;   KNEE ARTHROSCOPY  [id number]   left and right   LUMBAR LAMINECTOMY/DECOMPRESSION MICRODISCECTOMY Left 09/29/2017   Procedure: LEFT LUMBAR TWO - LUMBAR THREELAMINOTOMY/MICRODISCECTOMY;  Surgeon: Jovita Gamma, MD;  Location: [place];  Service:  Neurosurgery;  Laterality: Left;  LEFT LUMBAR 2- LUMBAR 3 LAMINOTOMY/MICRODISCECTOMY   NASAL SINUS SURGERY  05/2015   RIGHT/LEFT HEART CATH AND CORONARY ANGIOGRAPHY N/A 04/10/2020   Procedure: RIGHT/LEFT HEART CATH AND CORONARY ANGIOGRAPHY;  Surgeon: Troy Sine, MD;  Location: [place] [facility];  Service: Cardiovascular;  Laterality: N/A;   TEE WITHOUT CARDIOVERSION N/A 05/01/2020   Procedure: TRANSESOPHAGEAL ECHOCARDIOGRAM (TEE);  Surgeon: Prescott Gum, Collier Salina, MD;  Location: [place];  Service: Open Heart Surgery;  Laterality: N/A;   TOTAL KNEE ARTHROPLASTY Right 06/24/2016   Procedure: TOTAL KNEE ARTHROPLASTY;  Surgeon: Garald Balding, MD;  Location: [place];  Service: Orthopedics;  Laterality: Right;   TOTAL KNEE ARTHROPLASTY Left 12/18/2020   Procedure: LEFT TOTAL KNEE ARTHROPLASTY;  Surgeon: Garald Balding, MD;  [facility]: [facility];  Service: Orthopedics;  Laterality: Left;   TRANSFORAMINAL LUMBAR INTERBODY FUSION W/ MIS 2 LEVEL Left 04/15/2022   Procedure: Minimally Invasive Surgery Decompression and Transforaminal Lumbar Interbody Fusion , Left , Lumbar three-four, Lumbar four-five;  Surgeon: Dawley, Theodoro Doing, DO;  Location: [place];  Service: Neurosurgery;  Laterality: Left;   TRIGGER FINGER RELEASE Left 11/10/2013   Procedure: LEFT INDEX A-1 PULLEY RELEASE;  Surgeon: Cammie Sickle, MD;  Location: [place];  Service: Orthopedics;  Laterality: Left;   ULNAR NERVE TRANSPOSITION Left 11/10/2013   Procedure: ULNAR NERVE TRANSPOSITION;  Surgeon: Cammie Sickle, MD;  Location: [place];  Service: Orthopedics;  Laterality: Left;   Patient Active Problem List   Diagnosis Date Noted   Lumbar spinal stenosis 04/15/2022   COVID-19 03/27/2022   Chronic pain of left thumb 02/25/2022   SBE (subacute bacterial endocarditis) prophylaxis candidate 12/16/2021   Trigger finger, left ring finger 11/06/2021   Cervical radiculopathy 08/13/2021   Ulnar neuropathy  08/13/2021   Screen for colon cancer 06/06/2021   Chronic tachycardia 06/04/2021   Candidal balanitis 06/04/2021   Carpal tunnel syndrome, bilateral upper limbs 09/26/2020   Cubital tunnel syndrome on left 08/23/2020   Pulmonary nodule 1 cm or greater in diameter 04/26/2020   Statin intolerance 04/20/2020   Coronary atherosclerosis of native coronary artery 04/18/2020   Nonrheumatic aortic valve stenosis    Bicuspid aortic valve 04/06/2020   Primary osteoarthritis of left knee 11/08/2019   Bilateral primary osteoarthritis of knee 09/28/2019   HNP (herniated nucleus pulposus), lumbar 09/29/2017   Unilateral primary osteoarthritis, right knee 06/24/2016   Primary hypertension 01/18/2007    PCP: Janith Lima, MD   REFERRING PROVIDER: Noralee Stain, MD   REFERRING DIAG: [phone] (ICD-10-CM) - Spinal stenosis, lumbar region without neurogenic claudication   Rationale for Evaluation and Treatment: Rehabilitation  THERAPY DIAG:  Pain, lumbar region  Muscle weakness (generalized)  Difficulty walking  Localized edema  ONSET DATE: 04/15/22   Minimally invasive L 3-4, L4-5 decompression and transforaminal lumbar interbody fusion and arthrodesis, left sided approach Bilateral L3, L4, L5 segmental pedicle screw instrumentation, K2 M 7.5 x 50 mm bilaterally at L3, L4, L5 Placement of anterior biomechanical device, L3-4: [place] titanium K2 M interbody 10 x 28 mm; L4-5: [place] titanium K2 M interbody 28 x 14 mm Intraoperative use of autograft, same incision Intraoperative use of allograft  Intraoperative use of fluoroscopy, greater than 1 hour Intraoperative use of microscope, for microdissection    SUBJECTIVE:                                                                                                                                                                                           SUBJECTIVE STATEMENT: Pt to see MD on Monday. He started having pain into  the L SIJ last Wednesday that kept progressively worse. Did not have specific MOI. No issues with previous PT.   PERTINENT HISTORY:  Bilat TKA, double cardiac bypass surgery in 2021  PAIN:  Are you having pain? Yes: NPRS scale: 7/10 Pain location: L L/S Pain description: sharp/  stabbing Aggravating factors: sitting still, bending, towards the L Relieving factors: standing/walking, muscle relaxers, heating  PRECAUTIONS: Back  WEIGHT BEARING RESTRICTIONS: No  FALLS:  Has patient fallen in last 6 months? 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  L4-5 also shows severe left and moderate right foraminal narrowing and moderate posterior canal narrowing and possible encroachment on the exiting nerve root on the left.  L2/3 shows postoperative changes with mild posterior canal and bilateral foraminal narrowing as well.      TODAY'S TREATMENT:    3/14 STM bilat L2-5 paraspinals; bilat QL  STM to L deep hip rotators and glutes  Supine piriformis stretch 30s 3x on L     2/28  STM bilat L2-5 paraspinals; bilat QL   Exercises Supine deadbug with red ball press 2x10 - Seated Quadratus Lumborum Stretch in Chair  - 2 x daily - 7 x weekly - 1 sets - 3 reps - 30 hold - Seated Flexion Stretch with Swiss Ball  - 1 x daily - 7 x weekly - 2 sets - 10 reps - 5 hold - Standing  Quadratus Lumborum Mobilization with Small Ball on Wall  - 1 x daily - 7 x weekly - 1 sets - 1 reps - 2-5 mins hold - RDL/Half Deadlift  - 1 x daily - 2-3 x weekly - 3 sets - 8 reps - Sit to Stand with Resistance Around Legs  - 1 x daily - 2-3 x weekly - 3 sets - 8 reps - Full Leg Press  - 1 x daily - 2-3 x weekly - 3 sets - 8 reps - Standing Anti-Rotation Press with Anchored Resistance  - 1 x daily - 2-3 x weekly - 3 sets - 8 reps                Previous:                                                                                                                Reviewed pt presentation, response to prior Rx, HEP compliance, and pain levels.  Reviewed HEP. Pt educated in correct performance and palpation of TrA contraction. Pt performed:  Seated QL stretch 2x30 sec bilat  Supine PPT 2x10  Supine TrA contraction with 5 sec hold  Supine marching with TrA 2x10  Supine alt LE extension with TrA 2x10 Pt received supine manual HS stretch 2x20-30 sec bilat   PT  updated HEP and gave pt a HEP handout.  Educated pt in correct form and appropriate frequency.  PT instructed pt he should not have pain with HEP.   Manual Therapy: Gentle STM to left sided lumbar paraspinals, not on incision, and right side-lying with pillow between knees.  Patient also had tenderness in left glute and PT performed gentle STM to left glute.     PATIENT EDUCATION:  Education details: anatomy, exercise form, HEP, and POC Person educated: Patient Education method: Explanation, Demonstration, Tactile cues, Verbal cues, and Handouts Education comprehension: verbalized understanding, returned demonstration, verbal cues required, and tactile cues required  HOME EXERCISE PROGRAM:  Access Code: [email or web address] URL: [email or web address] Date: 06/18/2022 Prepared by: [name]  Updated HEP: - Supine Transversus Abdominis Bracing - Hands on Stomach  - 2 x daily - 7 x weekly - 1-2 sets - 10 reps - 5 seconds  hold - Supine March  - 1 x daily - 7 x weekly - 2 sets - 10 reps - Supine Core Control with Leg Extension  - 1 x daily - 7 x weekly - 2 sets - 10 reps  ASSESSMENT:  CLINICAL IMPRESSION: Pt presents today with increase in L sided SIJ pain that is likely due to increase in activity/walking/exercise. Pt did have significant muscle tension about the L hip. No tenderness or recreation of pain into the L/S where hardware was place. Pain and tightness primarily into the hip. Pt to see MD Monday for follow up. Pt advised to hold on current exercise at this time and to schedule a massage for pain management prior to next PT session. Continue once cleared by MD.  Pt should benefit from cont skilled PT services to address impairments and goals and to improve overall function.   OBJECTIVE IMPAIRMENTS: Abnormal gait, decreased activity tolerance, decreased endurance, decreased mobility, difficulty walking, decreased ROM, decreased strength, hypomobility, increased fascial restrictions, increased muscle spasms, impaired flexibility, impaired sensation, improper body mechanics, postural dysfunction, and pain.   ACTIVITY LIMITATIONS: carrying, lifting, bending, sitting, standing, squatting, stairs, transfers, and locomotion level  PARTICIPATION LIMITATIONS: cleaning, laundry, interpersonal relationship, driving, shopping, community activity, yard work, and exercise  PERSONAL FACTORS: Age, Time since onset of injury/illness/exacerbation, and 1-2 comorbidities:    are also affecting patient's functional outcome.   REHAB POTENTIAL: Good  CLINICAL DECISION MAKING: Stable/uncomplicated  EVALUATION COMPLEXITY: Low   GOALS:   SHORT TERM GOALS: Target date: 07/30/2022   Pt will become independent with HEP in order to demonstrate synthesis of PT education.   Goal status: INITIAL  2.  Pt will report at least 2 pt reduction on NPRS scale for pain in order to demonstrate functional improvement with household  activity, self care, and ADL.   Goal status: INITIAL  3.  Pt will score at least 5 pt increase on FOTO to demonstrate functional improvement in MCII and pt perceived function.   Goal status: INITIAL   LONG TERM GOALS: Target date: 09/10/2022   Pt  will become independent with final HEP in order to demonstrate synthesis of PT education.  Goal status: INITIAL  2.  Pt will score >/= 66 on FOTO to demonstrate improvement in perceived lumbar function.   Goal status: INITIAL  3.  Pt will be able to demonstrate/report ability to walk >30 mins without pain in order to demonstrate functional improvement and tolerance to exercise and community mobility.   Goal status: INITIAL  4.  Pt will be able to demonstrate DL squat with >/=10 lbs in  order to demonstrate functional improvement in bilat LE strength for return to ADL and light exercise.   Goal status: INITIAL  PLAN:  PT FREQUENCY: 1x/week  PT DURATION: 12 weeks (likely D/C by 8 wks)  PLANNED INTERVENTIONS: Therapeutic exercises, Therapeutic activity, Neuromuscular re-education, Balance training, Gait training, Patient/Family education, Self Care, Joint mobilization, Joint manipulation, Stair training, Orthotic/Fit training, DME instructions, Aquatic Therapy, Dry Needling, Electrical stimulation, Spinal manipulation, Spinal mobilization, Cryotherapy, Moist heat, scar mobilization, Splintting, Taping, Vasopneumatic device, Traction, Ultrasound, Ionotophoresis '4mg'$ /ml Dexamethasone, Manual therapy, and Re-evaluation.  PLAN FOR NEXT SESSION: STM, TPDN PRN with lumbar fusion and past laminectomy in mind, lumbar and hip stretching, progressive LE and hip strength.  Cont per lumbar fusion protocol.    [name] PT, DPT 07/31/22 10:18 AM

## 2022-08-02 MED FILL — Alirocumab Subcutaneous Solution Auto-Injector 75 MG/ML: SUBCUTANEOUS | 28 days supply | Qty: 2 | Fill #2 | Status: AC

## 2022-08-03 ENCOUNTER — Other Ambulatory Visit (HOSPITAL_BASED_OUTPATIENT_CLINIC_OR_DEPARTMENT_OTHER): Payer: Self-pay

## 2022-08-04 ENCOUNTER — Other Ambulatory Visit (HOSPITAL_BASED_OUTPATIENT_CLINIC_OR_DEPARTMENT_OTHER): Payer: Self-pay

## 2022-08-04 DIAGNOSIS — M47816 Spondylosis without myelopathy or radiculopathy, lumbar region: Secondary | ICD-10-CM | POA: Diagnosis not present

## 2022-08-04 DIAGNOSIS — M791 Myalgia, unspecified site: Secondary | ICD-10-CM | POA: Diagnosis not present

## 2022-08-08 ENCOUNTER — Ambulatory Visit (INDEPENDENT_AMBULATORY_CARE_PROVIDER_SITE_OTHER): Payer: BC Managed Care – PPO | Admitting: Orthopaedic Surgery

## 2022-08-08 ENCOUNTER — Other Ambulatory Visit (INDEPENDENT_AMBULATORY_CARE_PROVIDER_SITE_OTHER): Payer: BC Managed Care – PPO

## 2022-08-08 ENCOUNTER — Encounter: Payer: Self-pay | Admitting: Orthopaedic Surgery

## 2022-08-08 DIAGNOSIS — M79645 Pain in left finger(s): Secondary | ICD-10-CM

## 2022-08-08 MED ORDER — BUPIVACAINE HCL 0.5 % IJ SOLN
1.0000 mL | INTRAMUSCULAR | Status: AC | PRN
  Administered 2022-08-08: 1 mL via INTRA_ARTICULAR

## 2022-08-08 MED ORDER — LIDOCAINE HCL 1 % IJ SOLN
1.0000 mL | INTRAMUSCULAR | Status: AC | PRN
  Administered 2022-08-08: 1 mL

## 2022-08-08 MED ORDER — METHYLPREDNISOLONE ACETATE 40 MG/ML IJ SUSP
40.0000 mg | INTRAMUSCULAR | Status: AC | PRN
  Administered 2022-08-08: 40 mg via INTRA_ARTICULAR

## 2022-08-08 NOTE — Progress Notes (Signed)
Office Visit Note   Patient: Philip Winters           Date of Birth: 1960-11-02           MRN: [id number] Visit Date: 08/08/2022              Requested by: Janith Lima, MD [address] [place],  [place] [address] PCP: Janith Lima, MD   Assessment & Plan: Visit Diagnoses:  1. Pain of left thumb     Plan: The distal phalanx fracture of the left thumb has fully healed.  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  Treatment options explained.  We will try a steroid injection which he tolerated well.  He will check back with sometime in August once Dr. Lynetta Mare starts practicing.  Follow-Up Instructions: No follow-ups on file.   Orders:  Orders Placed This Encounter  Procedures   XR Finger Thumb Left   No orders of the defined types were placed in this encounter.     Procedures: Medium Joint Inj: L radiocarpal on 08/08/2022 2:53 PM Indications: pain Details: 25 G needle, dorsal approach Medications: 1 mL lidocaine 1 %; 1 mL bupivacaine 0.5 %; 40 mg methylPREDNISolone acetate 40 MG/ML Outcome: tolerated well, no immediate complications Patient was prepped and draped in the usual sterile fashion.       Clinical Data: No additional findings.   Subjective: Chief Complaint  Patient presents with   Left Thumb - Pain    HPI  Philip Winters comes in today for follow-up of left distal phalanx fracture.  Also complaining of left wrist pain for the last month.  Thumb feels fine.  Wrist causes pain chronically and limitation range of motion.  Review of Systems  Constitutional: Negative.   HENT: Negative.    Eyes: Negative.   Respiratory: Negative.    Cardiovascular: Negative.   Gastrointestinal: Negative.   Endocrine: Negative.   Genitourinary: Negative.   Skin: Negative.   Allergic/Immunologic: Negative.   Neurological: Negative.   Hematological: Negative.   Psychiatric/Behavioral: Negative.    All other systems reviewed and are  negative.    Objective: Vital Signs: There were no vitals taken for this visit.  Physical Exam Vitals and nursing note reviewed.  Constitutional:      Appearance: He is well-developed.  Pulmonary:     Effort: Pulmonary effort is normal.  Abdominal:     Palpations: Abdomen is soft.  Skin:    General: Skin is warm.  Neurological:     Mental Status: He is alert and oriented to person, place, and time.  Psychiatric:        Behavior: Behavior normal.        Thought Content: Thought content normal.        Judgment: Judgment normal.     Ortho Exam  Examination of the left thumb is benign. Examination of the left wrist shows limited range of motion and pain with range of motion.  Specialty Comments:  No specialty comments available.  Imaging: XR Finger Thumb Left  Result Date: 08/08/2022 Distal phalanx fracture has fully consolidated.  He has significant degenerative changes of the radiocarpal joint and evidence of slack wrist    PMFS History: Patient Active Problem List   Diagnosis Date Noted   Lumbar spinal stenosis 04/15/2022   COVID-19 03/27/2022   Chronic pain of left thumb 02/25/2022   SBE (subacute bacterial endocarditis) prophylaxis candidate 12/16/2021   Trigger finger, left ring finger 11/06/2021  Cervical radiculopathy 08/13/2021   Ulnar neuropathy 08/13/2021   Screen for colon cancer 06/06/2021   Chronic tachycardia 06/04/2021   Candidal balanitis 06/04/2021   Carpal tunnel syndrome, bilateral upper limbs 09/26/2020   Cubital tunnel syndrome on left 08/23/2020   Pulmonary nodule 1 cm or greater in diameter 04/26/2020   Statin intolerance 04/20/2020   Coronary atherosclerosis of native coronary artery 04/18/2020   Nonrheumatic aortic valve stenosis    Bicuspid aortic valve 04/06/2020   Primary osteoarthritis of left knee 11/08/2019   Bilateral primary osteoarthritis of knee 09/28/2019   HNP (herniated nucleus pulposus), lumbar 09/29/2017   Unilateral  primary osteoarthritis, right knee 06/24/2016   Primary hypertension 01/18/2007   Past Medical History:  Diagnosis Date   Allergy    Arthritis    CAD (coronary artery disease)    GERD (gastroesophageal reflux disease)    occ   Heart murmur    baby   High cholesterol    Hypertension    Peripheral neuropathy    Pneumonia    Right carotid bruit 06/10/2017   Sciatica    Seasonal allergies    Sinus tachycardia    Spinal stenosis     Family History  Problem Relation Age of Onset   Hypertension Mother    Throat cancer Mother    Hypertension Father    Cancer Father    Colon cancer Neg Hx    Esophageal cancer Neg Hx    Stomach cancer Neg Hx    Rectal cancer Neg Hx    Colon polyps Neg Hx     Past Surgical History:  Procedure Laterality Date   CARDIAC CATHETERIZATION     CARPAL TUNNEL WITH CUBITAL TUNNEL Left 11/10/2013   Procedure: LEFT CARPAL TUNNEL RELEASE;  Surgeon: Cammie Sickle, MD;  Location: [place];  Service: Orthopedics;  Laterality: Left;   COLONOSCOPY  2023   CORONARY ARTERY BYPASS GRAFT N/A 05/01/2020   Procedure: CORONARY ARTERY BYPASS GRAFTING (CABG) X 2 ON CARDIOPULMONARY BYPASS. LIMA TO LAD, SVG TO DIAG;  Surgeon: Ivin Poot, MD;  Location: [place];  Service: Open Heart Surgery;  Laterality: N/A;   ENDOVEIN HARVEST OF GREATER SAPHENOUS VEIN Right 05/01/2020   Procedure: ENDOVEIN HARVEST OF GREATER SAPHENOUS VEIN;  Surgeon: Ivin Poot, MD;  Location: [place];  Service: Open Heart Surgery;  Laterality: Right;   KNEE ARTHROSCOPY  [id number]   left and right   LUMBAR LAMINECTOMY/DECOMPRESSION MICRODISCECTOMY Left 09/29/2017   Procedure: LEFT LUMBAR TWO - LUMBAR THREELAMINOTOMY/MICRODISCECTOMY;  Surgeon: Jovita Gamma, MD;  Location: [place];  Service: Neurosurgery;  Laterality: Left;  LEFT LUMBAR 2- LUMBAR 3 LAMINOTOMY/MICRODISCECTOMY   NASAL SINUS SURGERY  05/2015   RIGHT/LEFT HEART CATH AND CORONARY ANGIOGRAPHY N/A 04/10/2020    Procedure: RIGHT/LEFT HEART CATH AND CORONARY ANGIOGRAPHY;  Surgeon: Troy Sine, MD;  Location: [place] [facility];  Service: Cardiovascular;  Laterality: N/A;   TEE WITHOUT CARDIOVERSION N/A 05/01/2020   Procedure: TRANSESOPHAGEAL ECHOCARDIOGRAM (TEE);  Surgeon: Prescott Gum, Collier Salina, MD;  Location: [place];  Service: Open Heart Surgery;  Laterality: N/A;   TOTAL KNEE ARTHROPLASTY Right 06/24/2016   Procedure: TOTAL KNEE ARTHROPLASTY;  Surgeon: Garald Balding, MD;  Location: [place];  Service: Orthopedics;  Laterality: Right;   TOTAL KNEE ARTHROPLASTY Left 12/18/2020   Procedure: LEFT TOTAL KNEE ARTHROPLASTY;  Surgeon: Garald Balding, MD;  [facility];  Service: Orthopedics;  Laterality: Left;   TRANSFORAMINAL LUMBAR INTERBODY FUSION W/ MIS 2  LEVEL Left 04/15/2022   Procedure: Minimally Invasive Surgery Decompression and Transforaminal Lumbar Interbody Fusion , Left , Lumbar three-four, Lumbar four-five;  Surgeon: Dawley, Theodoro Doing, DO;  Location: [place];  Service: Neurosurgery;  Laterality: Left;   TRIGGER FINGER RELEASE Left 11/10/2013   Procedure: LEFT INDEX A-1 PULLEY RELEASE;  Surgeon: Cammie Sickle, MD;  Location: [place];  Service: Orthopedics;  Laterality: Left;   ULNAR NERVE TRANSPOSITION Left 11/10/2013   Procedure: ULNAR NERVE TRANSPOSITION;  Surgeon: Cammie Sickle, MD;  Location: [place];  Service: Orthopedics;  Laterality: Left;   Social History   Occupational History   Occupation: Self employed    Comment: plumber  Tobacco Use   Smoking status: Former    Packs/day: 1.00    Years: 37.00    Additional pack years: 0.00    Total pack years: 37.00    Types: Cigarettes    Quit date: 05/04/2016    Years since quitting: 6.2   Smokeless tobacco: Never  Vaping Use   Vaping Use: Former  Substance and Sexual Activity   Alcohol use: Yes    Alcohol/week: 5.0 standard drinks of alcohol    Types: 5 Glasses of wine per week     Comment: weekends only with dinner   Drug use: No   Sexual activity: Yes

## 2022-08-20 ENCOUNTER — Encounter: Payer: Self-pay | Admitting: Cardiology

## 2022-08-20 ENCOUNTER — Other Ambulatory Visit (HOSPITAL_BASED_OUTPATIENT_CLINIC_OR_DEPARTMENT_OTHER): Payer: Self-pay

## 2022-08-20 DIAGNOSIS — I2510 Atherosclerotic heart disease of native coronary artery without angina pectoris: Secondary | ICD-10-CM

## 2022-08-20 DIAGNOSIS — I25110 Atherosclerotic heart disease of native coronary artery with unstable angina pectoris: Secondary | ICD-10-CM

## 2022-08-20 DIAGNOSIS — I350 Nonrheumatic aortic (valve) stenosis: Secondary | ICD-10-CM

## 2022-08-20 DIAGNOSIS — I700 Atherosclerosis of aorta: Secondary | ICD-10-CM

## 2022-08-20 DIAGNOSIS — Z789 Other specified health status: Secondary | ICD-10-CM

## 2022-08-21 ENCOUNTER — Other Ambulatory Visit (HOSPITAL_COMMUNITY): Payer: Self-pay

## 2022-08-21 ENCOUNTER — Ambulatory Visit (INDEPENDENT_AMBULATORY_CARE_PROVIDER_SITE_OTHER): Payer: Medicare Other | Admitting: Nurse Practitioner

## 2022-08-21 ENCOUNTER — Other Ambulatory Visit: Payer: Self-pay | Admitting: Nurse Practitioner

## 2022-08-21 ENCOUNTER — Telehealth: Payer: Self-pay

## 2022-08-21 VITALS — BP 114/64 | HR 102 | Temp 97.7°F | Ht 68.0 in | Wt 148.2 lb

## 2022-08-21 DIAGNOSIS — R58 Hemorrhage, not elsewhere classified: Secondary | ICD-10-CM | POA: Insufficient documentation

## 2022-08-21 DIAGNOSIS — R2241 Localized swelling, mass and lump, right lower limb: Secondary | ICD-10-CM | POA: Diagnosis not present

## 2022-08-21 DIAGNOSIS — D1723 Benign lipomatous neoplasm of skin and subcutaneous tissue of right leg: Secondary | ICD-10-CM | POA: Insufficient documentation

## 2022-08-21 LAB — CBC
HCT: 43.3 % (ref 39.0–52.0)
Hemoglobin: 14.8 g/dL (ref 13.0–17.0)
MCHC: 34.3 g/dL (ref 30.0–36.0)
MCV: 94.7 fl (ref 78.0–100.0)
Platelets: 234 10*3/uL (ref 150.0–400.0)
RBC: 4.57 Mil/uL (ref 4.22–5.81)
RDW: 13 % (ref 11.5–15.5)
WBC: 7.4 10*3/uL (ref 4.0–10.5)

## 2022-08-21 LAB — D-DIMER, QUANTITATIVE: D-Dimer, Quant: 0.55 mcg/mL FEU — ABNORMAL HIGH (ref <0.50)

## 2022-08-21 LAB — COMPREHENSIVE METABOLIC PANEL
ALT: 25 U/L (ref 0–53)
AST: 25 U/L (ref 0–37)
Albumin: 4.9 g/dL (ref 3.5–5.2)
Alkaline Phosphatase: 122 U/L — ABNORMAL HIGH (ref 39–117)
BUN: 17 mg/dL (ref 6–23)
CO2: 30 mEq/L (ref 19–32)
Calcium: 10.4 mg/dL (ref 8.4–10.5)
Chloride: 101 mEq/L (ref 96–112)
Creatinine, Ser: 0.77 mg/dL (ref 0.40–1.50)
GFR: 95.76 mL/min (ref >60.00)
Glucose, Bld: 115 mg/dL — ABNORMAL HIGH (ref 70–99)
Potassium: 5.1 mEq/L (ref 3.5–5.1)
Sodium: 138 mEq/L (ref 135–145)
Total Bilirubin: 0.6 mg/dL (ref 0.2–1.2)
Total Protein: 7.9 g/dL (ref 6.0–8.3)

## 2022-08-21 LAB — APTT: aPTT: 35.9 s (ref 25.4–36.8)

## 2022-08-21 LAB — PROTIME-INR
INR: 1 ratio (ref 0.8–1.0)
Prothrombin Time: 10.6 s (ref 9.6–13.1)

## 2022-08-21 NOTE — Progress Notes (Signed)
Established Patient Office Visit  Subjective   Patient ID: Philip Winters, male    DOB: [birth year]  Age: 62 y.o. MRN: [id number]  Chief Complaint  Patient presents with   Right thigh     Behind the thigh, do not know when it appeared, just noticed it      Mass: 62 y/o male arrives today for acute visit regarding mass on his right thigh.  Past medical history significant for arthritis, coronary artery disease, aortic valve stenosis, hyperlipidemia, hypertension, peripheral neuropathy, sciatica, spinal stenosis.  Mass is located to the right medial/posterior thigh, noticed it 1 week ago. Moveable, painless. No traumtic injury prior to mass development.  Has a bruise for close to the mouth, patient is not sure how the bruise occurred.  On a daily 81 mg aspirin but otherwise is not taking any blood thinners.    Review of Systems  Respiratory:  Negative for shortness of breath.   Cardiovascular:  Negative for chest pain.  Musculoskeletal:  Negative for myalgias.      Objective:     BP 114/64   Pulse (!) 102   Temp 97.7 F (36.5 C) (Temporal)   Ht 5\' 8"  (1.727 m)   Wt 148 lb 4 oz (67.2 kg)   SpO2 99%   BMI 22.54 kg/m    Physical Exam Vitals reviewed.  Constitutional:      Appearance: Normal appearance.  HENT:     Head: Normocephalic and atraumatic.  Cardiovascular:     Rate and Rhythm: Normal rate and regular rhythm.     Heart sounds: Murmur heard.  Pulmonary:     Effort: Pulmonary effort is normal.     Breath sounds: Normal breath sounds.  Musculoskeletal:     Cervical back: Neck supple.       Legs:  Skin:    General: Skin is warm and dry.  Neurological:     Mental Status: He is alert and oriented to person, place, and time.  Psychiatric:        Mood and Affect: Mood normal.        Behavior: Behavior normal.        Thought Content: Thought content normal.        Judgment: Judgment normal.      No results found for any visits on 08/21/22.    The ASCVD  Risk score (Arnett DK, et al., 2019) failed to calculate for the following reasons:   The valid total cholesterol range is 130 to 320 mg/dL    Assessment & Plan:   Problem List Items Addressed This Visit       Other   Mass of right lower leg - Primary    Etiology unclear, doubt blood clot based on clinical presentation as patient is without pain/swelling to extremity. Patient concerned about possible clot. Order D-Dimer if positive send to ER or emergent vascular US, if negative continue with outpatient soft tissue ultrasound for further evaluation. Also check labs including CBC, CMP, PT, APTT, Further recommendations may be made based on these results.        Relevant Orders   [name] RT LOWER EXTREM LTD SOFT TISSUE NON VASCULAR   D-Dimer, Quantitative   Ecchymosis    Etiology unclear, is on daily aspirin. Check CBC, CMP, PT, APTT. Further recommendations may be made based on these results.        Relevant Orders   CBC   Comprehensive metabolic panel   Protime-INR   APTT  Return in about 1 month (around 09/20/2022) for with Dr. Ronnald Ramp.    Ailene Ards, NP

## 2022-08-21 NOTE — Assessment & Plan Note (Signed)
Etiology unclear, is on daily aspirin. Check CBC, CMP, PT, APTT. Further recommendations may be made based on these results.

## 2022-08-21 NOTE — Assessment & Plan Note (Signed)
Etiology unclear, doubt blood clot based on clinical presentation as patient is without pain/swelling to extremity. Patient concerned about possible clot. Order D-Dimer if positive send to ER or emergent vascular US, if negative continue with outpatient soft tissue ultrasound for further evaluation. Also check labs including CBC, CMP, PT, APTT, Further recommendations may be made based on these results.

## 2022-08-21 NOTE — Progress Notes (Addendum)
Called patient to explain lab results. Discussed that D-Dimer is elevated and thus acute blood clot can not be ruled out. I recommend he proceed to the emergency department.  He prefers not to do this due to concerns of long wait times.  We discussed risk of negative outcomes if DVT is present and diagnosis is delayed, outcomes that may occur include pulmonary embolism, stroke, heart attack and could result in death.  Patient reports his understanding and would prefer to wait for outpatient imaging to be completed.  I will order stat DVT ultrasound, hopefully this can be done tomorrow.  Patient was told if he experiences any acute shortness of breath, chest pain, strokelike symptoms (examples relayed to patient included facial droop, difficulty speaking, difficulty swallowing, difficulty moving face, difficulty moving the limb) that he should call 911.  He reports his understanding and feels comfortable with taking the risks as discussed above.

## 2022-08-22 ENCOUNTER — Other Ambulatory Visit (HOSPITAL_BASED_OUTPATIENT_CLINIC_OR_DEPARTMENT_OTHER): Payer: Self-pay

## 2022-08-22 ENCOUNTER — Ambulatory Visit
Admission: RE | Admit: 2022-08-22 | Discharge: 2022-08-22 | Disposition: A | Discharge status: Home / Self Care | Payer: Medicare Other | Source: Ambulatory Visit | Attending: Nurse Practitioner | Admitting: Nurse Practitioner

## 2022-08-22 DIAGNOSIS — R2241 Localized swelling, mass and lump, right lower limb: Secondary | ICD-10-CM

## 2022-08-22 DIAGNOSIS — M7989 Other specified soft tissue disorders: Secondary | ICD-10-CM | POA: Diagnosis not present

## 2022-08-22 MED ORDER — PRALUENT 75 MG/ML ~~LOC~~ SOAJ
75.0000 mg | SUBCUTANEOUS | 11 refills | Status: DC
Start: 2022-08-22 — End: 2023-05-05
  Filled 2022-08-22: qty 2, 28d supply, fill #0

## 2022-08-22 NOTE — Telephone Encounter (Signed)
P/a for praluent approved.effective dates 4.4.24-4.4.25

## 2022-08-24 ENCOUNTER — Other Ambulatory Visit (HOSPITAL_BASED_OUTPATIENT_CLINIC_OR_DEPARTMENT_OTHER): Payer: Self-pay

## 2022-08-26 ENCOUNTER — Ambulatory Visit (INDEPENDENT_AMBULATORY_CARE_PROVIDER_SITE_OTHER): Payer: Medicare Other | Admitting: Internal Medicine

## 2022-08-26 ENCOUNTER — Other Ambulatory Visit (HOSPITAL_BASED_OUTPATIENT_CLINIC_OR_DEPARTMENT_OTHER): Payer: Self-pay

## 2022-08-26 ENCOUNTER — Encounter: Payer: Self-pay | Admitting: Internal Medicine

## 2022-08-26 VITALS — BP 100/62 | HR 101 | Temp 97.8°F | Ht 68.0 in | Wt 148.0 lb

## 2022-08-26 DIAGNOSIS — R21 Rash and other nonspecific skin eruption: Secondary | ICD-10-CM | POA: Diagnosis not present

## 2022-08-26 DIAGNOSIS — D1723 Benign lipomatous neoplasm of skin and subcutaneous tissue of right leg: Secondary | ICD-10-CM | POA: Diagnosis not present

## 2022-08-26 MED ORDER — TRIAMCINOLONE ACETONIDE 0.1 % EX CREA
1.0000 | TOPICAL_CREAM | Freq: Two times a day (BID) | CUTANEOUS | 0 refills | Status: DC
  Filled 2022-08-26: qty 100, 50d supply, fill #0

## 2022-08-26 MED ORDER — TRIAMCINOLONE ACETONIDE 0.1 % EX CREA: 1.0000 | TOPICAL_CREAM | Freq: Two times a day (BID) | CUTANEOUS | 0 refills | Status: DC

## 2022-08-26 MED ORDER — METHYLPREDNISOLONE ACETATE 40 MG/ML IJ SUSP
40.0000 mg | Freq: Once | INTRAMUSCULAR | Status: AC
Start: 2022-08-26 — End: 2022-08-26
  Administered 2022-08-26: 40 mg via INTRAMUSCULAR

## 2022-08-26 NOTE — Addendum Note (Signed)
Addended by: Levonne Lapping on: 08/26/2022 10:53 AM   Modules accepted: Orders

## 2022-08-26 NOTE — Progress Notes (Signed)
   Subjective:   Patient ID: Philip Winters, male    DOB: 09/16/1960, 62 y.o.   MRN: [id number]  Rash Pertinent negatives include no cough, diarrhea, shortness of breath or vomiting.   The patient is a 62 YO man coming in for rash 4-5 days. Recent blood draw on that arm. Red and itchiness.   Review of Systems  Constitutional: Negative.   HENT: Negative.    Eyes: Negative.   Respiratory:  Negative for cough, chest tightness and shortness of breath.   Cardiovascular:  Negative for chest pain, palpitations and leg swelling.  Gastrointestinal:  Negative for abdominal distention, abdominal pain, constipation, diarrhea, nausea and vomiting.  Musculoskeletal: Negative.   Skin:  Positive for rash.  Neurological: Negative.   Psychiatric/Behavioral: Negative.      Objective:  Physical Exam Constitutional:      Appearance: He is well-developed.  HENT:     Head: Normocephalic and atraumatic.  Cardiovascular:     Rate and Rhythm: Normal rate and regular rhythm.  Pulmonary:     Effort: Pulmonary effort is normal. No respiratory distress.     Breath sounds: Normal breath sounds. No wheezing or rales.  Abdominal:     General: Bowel sounds are normal. There is no distension.     Palpations: Abdomen is soft.     Tenderness: There is no abdominal tenderness. There is no rebound.  Musculoskeletal:     Cervical back: Normal range of motion.  Skin:    General: Skin is warm and dry.     Comments: Rash left  upper arm lateral to blood draw, no superficial phlebitis present more consistent with contact dermatitis  Neurological:     Mental Status: He is alert and oriented to person, place, and time.     Coordination: Coordination normal.     Vitals:   08/26/22 1023  BP: 100/62  Pulse: (!) 101  Temp: 97.8 F (36.6 C)  TempSrc: Oral  SpO2: 99%  Weight: 148 lb (67.1 kg)  Height: 5\' 8"  (1.727 m)    Assessment & Plan:  Depo-medrol 40 mg IM given at visit

## 2022-08-26 NOTE — Patient Instructions (Addendum)
We have given you the shot today and that will help we have also sent in a cream to use twice a day.  You can do the claritin up to 3 times a day.

## 2022-08-26 NOTE — Assessment & Plan Note (Signed)
Counseled about results and lack of need for biopsy. No indication for removal at this time.

## 2022-08-26 NOTE — Assessment & Plan Note (Signed)
Could be related to materials used during blood draw. No cording of vein consistent with no superficial phlebitis. Given depo-medrol 40 mg IM today and advised to increase claritin to TID for itching. Rx triamcinolone ointment.

## 2022-08-28 ENCOUNTER — Other Ambulatory Visit: Payer: Self-pay

## 2022-08-28 ENCOUNTER — Other Ambulatory Visit (HOSPITAL_BASED_OUTPATIENT_CLINIC_OR_DEPARTMENT_OTHER): Payer: Self-pay

## 2022-09-12 ENCOUNTER — Encounter: Payer: Self-pay | Admitting: Physician Assistant

## 2022-09-12 ENCOUNTER — Ambulatory Visit: Payer: Medicare Other | Admitting: Physician Assistant

## 2022-09-12 ENCOUNTER — Other Ambulatory Visit (INDEPENDENT_AMBULATORY_CARE_PROVIDER_SITE_OTHER): Payer: Medicare Other

## 2022-09-12 DIAGNOSIS — M25562 Pain in left knee: Secondary | ICD-10-CM | POA: Diagnosis not present

## 2022-09-12 NOTE — Progress Notes (Signed)
Office Visit Note   Patient: Philip Winters           Date of Birth: 1961-01-03           MRN: [id number] Visit Date: 09/12/2022              Requested by: Etta Grandchild, MD [address] [place],  [place] [address] PCP: Etta Grandchild, MD  Chief Complaint  Patient presents with   Left Knee - Pain      HPI: Philip Winters is a pleasant 62 year old gentleman who is approximately 8 to 9 months status post left total knee arthroplasty with Dr. Cleophas Dunker.  He has been doing very well.  He enjoys walking.  He comes in today because yesterday he was kneeling in his garden planting vegetables.  When he got up he felt a pop and could not initially bear weight.  Still having difficulty with this.  Denies any fever or chills and otherwise has been doing quite well.  Rated his pain as severe when this happened because he could not bear weight  Assessment & Plan: Visit Diagnoses:  1. Acute pain of left knee     Plan: X-rays do not demonstrate the components to be well-seated and no changes from previous films.  He does not have any redness he does not have any effusion.  He has good muscular strength.  I recommended treating this symptomatically for the next week or so.  If he still continues to have significant pain he will contact me.  Exam findings were benign probably most consistent with scar tissue that is released itself  Follow-Up Instructions: No follow-ups on file.   Ortho Exam  Patient is alert, oriented, no adenopathy, well-dressed, normal affect, normal respiratory effort. Left knee he can hold his knee in full extension has good flexion.  No tenderness over the collateral ligaments.  He does have tenderness over the medial side of the knee.  No effusion no redness no signs of infection good quadricep and patella tendon function neurovascular intact  Imaging: No results found. No images are attached to the encounter.  Labs: Lab Results  Component Value Date   HGBA1C 5.7 (H)  04/17/2021   HGBA1C 5.8 (H) 04/30/2020   ESRSEDRATE 47 (H) 10/16/2021   ESRSEDRATE 80 (H) 08/23/2020   ESRSEDRATE 25 (H) 04/06/2015   CRP 10.0 10/16/2021   LABURIC 3.7 (L) 08/23/2020   LABURIC CANCELED 11/17/2019   REPTSTATUS 06/18/2016 FINAL 06/17/2016   CULT NO GROWTH 06/17/2016     Lab Results  Component Value Date   ALBUMIN 4.9 08/21/2022   ALBUMIN 4.6 06/27/2022   ALBUMIN 4.2 10/10/2021    Lab Results  Component Value Date   MG 2.2 05/02/2020   MG 2.2 05/02/2020   MG 2.4 05/01/2020   No results found for: "VD25OH"  No results found for: "PREALBUMIN"    Latest Ref Rng & Units 08/21/2022    2:44 PM 03/13/2022   10:38 AM 10/10/2021   11:04 AM  CBC EXTENDED  WBC 4.0 - 10.5 K/uL 7.4  5.6  6.9   RBC 4.22 - 5.81 Mil/uL 4.57  4.51  4.15   Hemoglobin 13.0 - 17.0 g/dL 19.1  47.8  29.5   HCT 39.0 - 52.0 % 43.3  43.6  39.5   Platelets 150.0 - 400.0 K/uL 234.0  194  276   NEUT# 1.4 - 7.0 x10E3/uL   4.8   Lymph# 0.7 - 3.1 x10E3/uL   0.9  There is no height or weight on file to calculate BMI.  Orders:  Orders Placed This Encounter  Procedures   XR KNEE 3 VIEW LEFT   No orders of the defined types were placed in this encounter.    Procedures: No procedures performed  Clinical Data: No additional findings.  ROS:  All other systems negative, except as noted in the HPI. Review of Systems  Objective: Vital Signs: There were no vitals taken for this visit.  Specialty Comments:  No specialty comments available.  PMFS History: Patient Active Problem List   Diagnosis Date Noted   Rash 08/26/2022   Lipoma of right thigh 08/21/2022   Ecchymosis 08/21/2022   Lumbar spinal stenosis 04/15/2022   COVID-19 03/27/2022   Chronic pain of left thumb 02/25/2022   SBE (subacute bacterial endocarditis) prophylaxis candidate 12/16/2021   Trigger finger, left ring finger 11/06/2021   Cervical radiculopathy 08/13/2021   Ulnar neuropathy 08/13/2021   Screen for colon  cancer 06/06/2021   Chronic tachycardia 06/04/2021   Candidal balanitis 06/04/2021   Carpal tunnel syndrome, bilateral upper limbs 09/26/2020   Cubital tunnel syndrome on left 08/23/2020   Pulmonary nodule 1 cm or greater in diameter 04/26/2020   Statin intolerance 04/20/2020   Coronary atherosclerosis of native coronary artery 04/18/2020   Nonrheumatic aortic valve stenosis    Bicuspid aortic valve 04/06/2020   Primary osteoarthritis of left knee 11/08/2019   Bilateral primary osteoarthritis of knee 09/28/2019   HNP (herniated nucleus pulposus), lumbar 09/29/2017   Unilateral primary osteoarthritis, right knee 06/24/2016   Primary hypertension 01/18/2007   Past Medical History:  Diagnosis Date   Allergy    Arthritis    CAD (coronary artery disease)    GERD (gastroesophageal reflux disease)    occ   Heart murmur    baby   High cholesterol    Hypertension    Peripheral neuropathy    Pneumonia    Right carotid bruit 06/10/2017   Sciatica    Seasonal allergies    Sinus tachycardia    Spinal stenosis     Family History  Problem Relation Age of Onset   Hypertension Mother    Throat cancer Mother    Hypertension Father    Cancer Father    Colon cancer Neg Hx    Esophageal cancer Neg Hx    Stomach cancer Neg Hx    Rectal cancer Neg Hx    Colon polyps Neg Hx     Past Surgical History:  Procedure Laterality Date   CARDIAC CATHETERIZATION     CARPAL TUNNEL WITH CUBITAL TUNNEL Left 11/10/2013   Procedure: LEFT CARPAL TUNNEL RELEASE;  Surgeon: Wyn Forster, MD;  Location: [facility];  Service: Orthopedics;  Laterality: Left;   COLONOSCOPY  2023   CORONARY ARTERY BYPASS GRAFT N/A 05/01/2020   Procedure: CORONARY ARTERY BYPASS GRAFTING (CABG) X 2 ON CARDIOPULMONARY BYPASS. LIMA TO LAD, SVG TO DIAG;  Surgeon: Kerin Perna, MD;  Location: [facility] OR;  Service: Open Heart Surgery;  Laterality: N/A;   ENDOVEIN HARVEST OF GREATER SAPHENOUS VEIN Right 05/01/2020    Procedure: ENDOVEIN HARVEST OF GREATER SAPHENOUS VEIN;  Surgeon: Kerin Perna, MD;  Location: [facility] OR;  Service: Open Heart Surgery;  Laterality: Right;   KNEE ARTHROSCOPY  [id number]   left and right   LUMBAR LAMINECTOMY/DECOMPRESSION MICRODISCECTOMY Left 09/29/2017   Procedure: LEFT LUMBAR TWO - LUMBAR THREELAMINOTOMY/MICRODISCECTOMY;  Surgeon: Shirlean Kelly, MD;  Location: [facility] OR;  Service: Neurosurgery;  Laterality: Left;  LEFT LUMBAR 2- LUMBAR 3 LAMINOTOMY/MICRODISCECTOMY   NASAL SINUS SURGERY  05/2015   RIGHT/LEFT HEART CATH AND CORONARY ANGIOGRAPHY N/A 04/10/2020   Procedure: RIGHT/LEFT HEART CATH AND CORONARY ANGIOGRAPHY;  Surgeon: Lennette Bihari, MD;  Location: [facility];  Service: Cardiovascular;  Laterality: N/A;   TEE WITHOUT CARDIOVERSION N/A 05/01/2020   Procedure: TRANSESOPHAGEAL ECHOCARDIOGRAM (TEE);  Surgeon: Donata Clay, Theron Arista, MD;  Location: [facility] OR;  Service: Open Heart Surgery;  Laterality: N/A;   TOTAL KNEE ARTHROPLASTY Right 06/24/2016   Procedure: TOTAL KNEE ARTHROPLASTY;  Surgeon: Valeria Batman, MD;  Location: [facility] OR;  Service: Orthopedics;  Laterality: Right;   TOTAL KNEE ARTHROPLASTY Left 12/18/2020   Procedure: LEFT TOTAL KNEE ARTHROPLASTY;  Surgeon: Valeria Batman, MD;  Location: [facility] ORS;  Service: Orthopedics;  Laterality: Left;   TRANSFORAMINAL LUMBAR INTERBODY FUSION W/ MIS 2 LEVEL Left 04/15/2022   Procedure: Minimally Invasive Surgery Decompression and Transforaminal Lumbar Interbody Fusion , Left , Lumbar three-four, Lumbar four-five;  Surgeon: Dawley, Alan Mulder, DO;  Location: [facility] OR;  Service: Neurosurgery;  Laterality: Left;   TRIGGER FINGER RELEASE Left 11/10/2013   Procedure: LEFT INDEX A-1 PULLEY RELEASE;  Surgeon: Wyn Forster, MD;  Location: [facility];  Service: Orthopedics;  Laterality: Left;   ULNAR NERVE TRANSPOSITION Left 11/10/2013   Procedure: ULNAR NERVE TRANSPOSITION;  Surgeon: Wyn Forster, MD;   Location: [facility];  Service: Orthopedics;  Laterality: Left;   Social History   Occupational History   Occupation: Self employed    Comment: plumber  Tobacco Use   Smoking status: Former    Packs/day: 1.00    Years: 37.00    Additional pack years: 0.00    Total pack years: 37.00    Types: Cigarettes    Quit date: 05/04/2016    Years since quitting: 6.3   Smokeless tobacco: Never  Vaping Use   Vaping Use: Former  Substance and Sexual Activity   Alcohol use: Yes    Alcohol/week: 5.0 standard drinks of alcohol    Types: 5 Glasses of wine per week    Comment: weekends only with dinner   Drug use: No   Sexual activity: Yes

## 2022-09-18 ENCOUNTER — Encounter: Payer: Self-pay | Admitting: Internal Medicine

## 2022-09-18 ENCOUNTER — Ambulatory Visit (INDEPENDENT_AMBULATORY_CARE_PROVIDER_SITE_OTHER): Payer: Medicare Other | Admitting: Internal Medicine

## 2022-09-18 VITALS — BP 116/74 | HR 112 | Temp 98.0°F | Ht 68.0 in | Wt 147.0 lb

## 2022-09-18 DIAGNOSIS — Z1159 Encounter for screening for other viral diseases: Secondary | ICD-10-CM | POA: Diagnosis not present

## 2022-09-18 DIAGNOSIS — R739 Hyperglycemia, unspecified: Secondary | ICD-10-CM

## 2022-09-18 DIAGNOSIS — R000 Tachycardia, unspecified: Secondary | ICD-10-CM

## 2022-09-18 DIAGNOSIS — E785 Hyperlipidemia, unspecified: Secondary | ICD-10-CM | POA: Insufficient documentation

## 2022-09-18 DIAGNOSIS — R200 Anesthesia of skin: Secondary | ICD-10-CM | POA: Insufficient documentation

## 2022-09-18 DIAGNOSIS — I10 Essential (primary) hypertension: Secondary | ICD-10-CM | POA: Diagnosis not present

## 2022-09-18 DIAGNOSIS — G63 Polyneuropathy in diseases classified elsewhere: Secondary | ICD-10-CM

## 2022-09-18 DIAGNOSIS — Z0000 Encounter for general adult medical examination without abnormal findings: Secondary | ICD-10-CM | POA: Diagnosis not present

## 2022-09-18 DIAGNOSIS — G609 Hereditary and idiopathic neuropathy, unspecified: Secondary | ICD-10-CM

## 2022-09-18 DIAGNOSIS — Z0001 Encounter for general adult medical examination with abnormal findings: Secondary | ICD-10-CM

## 2022-09-18 DIAGNOSIS — E538 Deficiency of other specified B group vitamins: Secondary | ICD-10-CM

## 2022-09-18 DIAGNOSIS — Z114 Encounter for screening for human immunodeficiency virus [HIV]: Secondary | ICD-10-CM | POA: Insufficient documentation

## 2022-09-18 LAB — FOLATE: Folate: >23.5 ng/mL (ref >5.9)

## 2022-09-18 LAB — HEMOGLOBIN A1C: Hgb A1c MFr Bld: 5.6 % (ref 4.6–6.5)

## 2022-09-18 LAB — TSH: TSH: 1.04 u[IU]/mL (ref 0.35–5.50)

## 2022-09-18 LAB — VITAMIN B12: Vitamin B-12: 336 pg/mL (ref 211–911)

## 2022-09-18 MED ORDER — PREGABALIN 25 MG PO CAPS
25.0000 mg | ORAL_CAPSULE | Freq: Three times a day (TID) | ORAL | 0 refills | Status: DC
Start: 2022-09-18 — End: 2023-03-05

## 2022-09-18 NOTE — Progress Notes (Signed)
Subjective:  Patient ID: Philip Winters, male    DOB: Nov 28, 1960  Age: 62 y.o. MRN: [id number]  CC: Annual Exam and Coronary Artery Disease   HPI ARHAAN MACEWAN presents for a CPX and f/up --    He is active and denies DOE, CP, SOB, edema. He continues to c/o neuropathy symptoms that interfere with his sleep. He had a NCS done years ago that was unremarkable.  Outpatient Medications Prior to Visit  Medication Sig Dispense Refill   acetaminophen (TYLENOL) 500 MG tablet Take 500-1,000 mg by mouth every 6 (six) hours as needed (pain.).     Alirocumab (PRALUENT) 75 MG/ML SOAJ Inject 75 mg into the skin every 14 (fourteen) days. 2 mL 11   aspirin EC 81 MG tablet Take 1 tablet (81 mg total) by mouth in the morning. Swallow whole. 30 tablet 12   clobetasol cream (TEMOVATE) 0.05 % Apply 1 Application topically 2 (two) times daily as needed (skin irritation/rash.).     clobetasol cream (TEMOVATE) 0.05 % Apply to affected areas twice daily as needed for eczema, not to face,groin,or underarms 60 g 3   diltiazem (CARDIZEM CD) 240 MG 24 hr capsule Take 1 capsule (240 mg total) by mouth daily. 90 capsule 3   fluticasone (FLONASE) 50 MCG/ACT nasal spray Place 2 sprays into both nostrils in the morning.     methocarbamol (ROBAXIN) 500 MG tablet Take 1 tablet (500 mg total) by mouth every 6 (six) hours as needed for muscle spasms. 90 tablet 2   Multiple Vitamin (MULTIVITAMIN WITH MINERALS) TABS tablet Take 1 tablet by mouth in the morning.     mupirocin ointment (BACTROBAN) 2 % Apply to affected areas up to 3 times daily as needed 22 g 3   olmesartan (BENICAR) 5 MG tablet Take 2 tablets (10 mg total) by mouth daily. 180 tablet 3   oxyCODONE 10 MG TABS Take 1 tablet (10 mg total) by mouth every 4 (four) hours as needed for severe pain ((score 7 to 10)). 30 tablet 0   Polyethyl Glycol-Propyl Glycol (LUBRICANT EYE DROPS) 0.4-0.3 % SOLN Place 1 drop into both eyes 3 (three) times daily as needed (dry/irritated  eyes).     triamcinolone cream (KENALOG) 0.1 % Apply 1 Application topically 2 (two) times daily. 100 g 0   No facility-administered medications prior to visit.    ROS Review of Systems  Constitutional: Negative.  Negative for diaphoresis, fatigue and unexpected weight change.  HENT: Negative.    Eyes: Negative.   Respiratory:  Negative for cough, chest tightness, shortness of breath and wheezing.   Cardiovascular:  Negative for chest pain, palpitations and leg swelling.  Gastrointestinal:  Negative for abdominal pain, constipation, diarrhea, nausea and vomiting.  Endocrine: Negative.   Genitourinary:  Negative for difficulty urinating and dysuria.  Musculoskeletal: Negative.   Skin: Negative.  Negative for color change and rash.  Neurological:  Positive for numbness. Negative for dizziness, weakness and light-headedness.       Numbness with pins and needles sensation in both feet for several years  Hematological:  Negative for adenopathy. Does not bruise/bleed easily.  Psychiatric/Behavioral: Negative.      Objective:  BP 116/74 (BP Location: Left Arm, Patient Position: Sitting, Cuff Size: Large)   Pulse (!) 112   Temp 98 F (36.7 C) (Oral)   Ht 5\' 8"  (1.727 m)   Wt 147 lb (66.7 kg)   SpO2 98%   BMI 22.35 kg/m   BP Readings from  Last 3 Encounters:  09/18/22 116/74  08/26/22 100/62  08/21/22 114/64    Wt Readings from Last 3 Encounters:  09/18/22 147 lb (66.7 kg)  08/26/22 148 lb (67.1 kg)  08/21/22 148 lb 4 oz (67.2 kg)    Physical Exam Vitals reviewed.  Constitutional:      Appearance: Normal appearance.  HENT:     Nose: Nose normal.     Mouth/Throat:     Mouth: Mucous membranes are moist.  Eyes:     General: No scleral icterus.    Conjunctiva/sclera: Conjunctivae normal.  Cardiovascular:     Rate and Rhythm: Tachycardia present.     Pulses:          Dorsalis pedis pulses are 1+ on the right side and 1+ on the left side.       Posterior tibial pulses  are 1+ on the right side and 1+ on the left side.     Heart sounds: Murmur heard.     Systolic murmur is present with a grade of 2/6.     No diastolic murmur is present.     No friction rub. No gallop.  Pulmonary:     Effort: Pulmonary effort is normal.     Breath sounds: No stridor. No wheezing, rhonchi or rales.  Abdominal:     General: Abdomen is flat.     Palpations: There is no mass.     Tenderness: There is no abdominal tenderness. There is no guarding.     Hernia: No hernia is present.  Musculoskeletal:        General: Normal range of motion.     Cervical back: Neck supple.     Right lower leg: No edema.     Left lower leg: No edema.  Feet:     Right foot:     Skin integrity: Skin integrity normal.     Toenail Condition: Right toenails are normal.     Left foot:     Skin integrity: Skin integrity normal.     Toenail Condition: Left toenails are normal.  Lymphadenopathy:     Cervical: No cervical adenopathy.  Skin:    General: Skin is warm and dry.     Findings: No rash.  Neurological:     General: No focal deficit present.     Mental Status: He is alert. Mental status is at baseline.     Sensory: Sensory deficit present.     Motor: Atrophy present. No weakness.     Coordination: Coordination is intact. Coordination normal.     Deep Tendon Reflexes: Reflexes abnormal.     Reflex Scores:      Tricep reflexes are 1+ on the right side and 1+ on the left side.      Bicep reflexes are 1+ on the right side and 1+ on the left side.      Brachioradialis reflexes are 1+ on the right side and 1+ on the left side.      Patellar reflexes are 2+ on the right side and 0 on the left side.      Achilles reflexes are 0 on the right side and 0 on the left side. Psychiatric:        Mood and Affect: Mood normal.        Behavior: Behavior normal.     Lab Results  Component Value Date   WBC 7.4 08/21/2022   HGB 14.8 08/21/2022   HCT 43.3 08/21/2022   PLT 234.0 08/21/2022  GLUCOSE 115 (H) 08/21/2022   CHOL 122 06/27/2022   TRIG 68 06/27/2022   HDL 64 06/27/2022   LDLCALC 44 06/27/2022   ALT 25 08/21/2022   AST 25 08/21/2022   NA 138 08/21/2022   K 5.1 08/21/2022   CL 101 08/21/2022   CREATININE 0.77 08/21/2022   BUN 17 08/21/2022   CO2 30 08/21/2022   TSH 1.04 09/18/2022   PSA 1.5 10/17/2021   INR 1.0 08/21/2022   HGBA1C 5.6 09/18/2022    US Venous Img Lower Unilateral Right (DVT)  Result Date: 08/22/2022 CLINICAL DATA:  Leg swelling EXAM: RIGHT LOWER EXTREMITY VENOUS DOPPLER ULTRASOUND TECHNIQUE: Gray-scale sonography with compression, as well as color and duplex ultrasound, were performed to evaluate the deep venous system(s) from the level of the common femoral vein through the popliteal and proximal calf veins. COMPARISON:  None Available. FINDINGS: VENOUS Normal compressibility of the common femoral, superficial femoral, and popliteal veins, as well as the visualized calf veins. Visualized portions of profunda femoral vein and great saphenous vein unremarkable. No filling defects to suggest DVT on grayscale or color Doppler imaging. Doppler waveforms show normal direction of venous flow, normal respiratory plasticity and response to augmentation. Limited views of the contralateral common femoral vein are unremarkable. OTHER There is a hyperechoic mass in the right upper inner thigh measuring 2.7 x 2.0 x 0.7 cm Limitations: none IMPRESSION: 1. No evidence of right lower extremity DVT. 2. Hyperechoic mass in the right upper inner thigh measuring 2.7 x 2.0 x 0.7 cm, likely lipoma. Electronically Signed   By: Deatra Robinson M.D.   On: 08/22/2022 12:14    Assessment & Plan:   Primary hypertension- His BP is adequately well controlled.  Encounter for screening for HIV -     HIV Antibody (routine testing w rflx); Future  Need for hepatitis C screening test -     Hepatitis C antibody; Future  Numbness in feet - Labs are remarkable for B6 toxicity. He will  stop taking a B6 supplement. -     Vitamin B1; Future -     Vitamin B12; Future -     Folate; Future -     Vitamin B6; Future  Tachycardia- Labs are negative for secondary causes. -     TSH; Future  Dyslipidemia, goal LDL below 70 - LDL goal achieved. Doing well on the statin  -     TSH; Future  Idiopathic small fiber peripheral neuropathy -     Pregabalin; Take 1 capsule (25 mg total) by mouth 3 (three) times daily.  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     Follow-up: Return in about 6 months (around 03/21/2023).  Sanda Linger, MD

## 2022-09-18 NOTE — Patient Instructions (Signed)
Health Maintenance, Male Adopting a healthy lifestyle and getting preventive care are important in promoting health and wellness. Ask your health care provider about: The right schedule for you to have regular tests and exams. Things you can do on your own to prevent diseases and keep yourself healthy. What should I know about diet, weight, and exercise? Eat a healthy diet  Eat a diet that includes plenty of vegetables, fruits, low-fat dairy products, and lean protein. Do not eat a lot of foods that are high in solid fats, added sugars, or sodium. Maintain a healthy weight Body mass index (BMI) is a measurement that can be used to identify possible weight problems. It estimates body fat based on height and weight. Your health care provider can help determine your BMI and help you achieve or maintain a healthy weight. Get regular exercise Get regular exercise. This is one of the most important things you can do for your health. Most adults should: Exercise for at least 150 minutes each week. The exercise should increase your heart rate and make you sweat (moderate-intensity exercise). Do strengthening exercises at least twice a week. This is in addition to the moderate-intensity exercise. Spend less time sitting. Even light physical activity can be beneficial. Watch cholesterol and blood lipids Have your blood tested for lipids and cholesterol at 63 years of age, then have this test every 5 years. You may need to have your cholesterol levels checked more often if: Your lipid or cholesterol levels are high. You are older than 63 years of age. You are at high risk for heart disease. What should I know about cancer screening? Many types of cancers can be detected early and may often be prevented. Depending on your health history and family history, you may need to have cancer screening at various ages. This may include screening for: Colorectal cancer. Prostate cancer. Skin cancer. Lung  cancer. What should I know about heart disease, diabetes, and high blood pressure? Blood pressure and heart disease High blood pressure causes heart disease and increases the risk of stroke. This is more likely to develop in people who have high blood pressure readings or are overweight. Talk with your health care provider about your target blood pressure readings. Have your blood pressure checked: Every 3-5 years if you are 18-39 years of age. Every year if you are 40 years old or older. If you are between the ages of 65 and 75 and are a current or former smoker, ask your health care provider if you should have a one-time screening for abdominal aortic aneurysm (AAA). Diabetes Have regular diabetes screenings. This checks your fasting blood sugar level. Have the screening done: Once every three years after age 45 if you are at a normal weight and have a low risk for diabetes. More often and at a younger age if you are overweight or have a high risk for diabetes. What should I know about preventing infection? Hepatitis B If you have a higher risk for hepatitis B, you should be screened for this virus. Talk with your health care provider to find out if you are at risk for hepatitis B infection. Hepatitis C Blood testing is recommended for: Everyone born from 1945 through 1965. Anyone with known risk factors for hepatitis C. Sexually transmitted infections (STIs) You should be screened each year for STIs, including gonorrhea and chlamydia, if: You are sexually active and are younger than 63 years of age. You are older than 63 years of age and your   health care provider tells you that you are at risk for this type of infection. Your sexual activity has changed since you were last screened, and you are at increased risk for chlamydia or gonorrhea. Ask your health care provider if you are at risk. Ask your health care provider about whether you are at high risk for HIV. Your health care provider  may recommend a prescription medicine to help prevent HIV infection. If you choose to take medicine to prevent HIV, you should first get tested for HIV. You should then be tested every 3 months for as long as you are taking the medicine. Follow these instructions at home: Alcohol use Do not drink alcohol if your health care provider tells you not to drink. If you drink alcohol: Limit how much you have to 0-2 drinks a day. Know how much alcohol is in your drink. In the [place] one drink equals one 12 oz bottle of beer (355 mL), one 5 oz glass of wine (148 mL), or one 1 oz glass of hard liquor (44 mL). Lifestyle Do not use any products that contain nicotine or tobacco. These products include cigarettes, chewing tobacco, and vaping devices, such as e-cigarettes. If you need help quitting, ask your health care provider. Do not use street drugs. Do not share needles. Ask your health care provider for help if you need support or information about quitting drugs. General instructions Schedule regular health, dental, and eye exams. Stay current with your vaccines. Tell your health care provider if: You often feel depressed. You have ever been abused or do not feel safe at home. Summary Adopting a healthy lifestyle and getting preventive care are important in promoting health and wellness. Follow your health care provider's instructions about healthy diet, exercising, and getting tested or screened for diseases. Follow your health care provider's instructions on monitoring your cholesterol and blood pressure. This information is not intended to replace advice given to you by your health care provider. Make sure you discuss any questions you have with your health care provider. Document Revised: 09/24/2020 Document Reviewed: 09/24/2020 Elsevier Patient Education  2023 [employer]vier Inc.  

## 2022-09-22 ENCOUNTER — Encounter: Payer: Self-pay | Admitting: Internal Medicine

## 2022-09-22 DIAGNOSIS — Z0001 Encounter for general adult medical examination with abnormal findings: Secondary | ICD-10-CM | POA: Insufficient documentation

## 2022-09-22 DIAGNOSIS — E538 Deficiency of other specified B group vitamins: Secondary | ICD-10-CM | POA: Insufficient documentation

## 2022-09-22 DIAGNOSIS — G63 Polyneuropathy in diseases classified elsewhere: Secondary | ICD-10-CM | POA: Insufficient documentation

## 2022-09-22 LAB — VITAMIN B1: Vitamin B1 (Thiamine): 18 nmol/L (ref 8–30)

## 2022-09-22 LAB — VITAMIN B6: Vitamin B6: 37.9 ng/mL — ABNORMAL HIGH (ref 2.1–21.7)

## 2022-09-22 LAB — HEPATITIS C ANTIBODY: Hepatitis C Ab: NONREACTIVE

## 2022-09-22 LAB — HIV ANTIBODY (ROUTINE TESTING W REFLEX): HIV 1&2 Ab, 4th Generation: NONREACTIVE

## 2022-09-23 ENCOUNTER — Ambulatory Visit: Payer: BC Managed Care – PPO | Admitting: Internal Medicine

## 2022-09-23 ENCOUNTER — Encounter: Payer: Self-pay | Admitting: Cardiology

## 2022-09-23 MED ORDER — DILTIAZEM HCL ER COATED BEADS 240 MG PO CP24: 240.0000 mg | ORAL_CAPSULE | Freq: Every day | ORAL | 3 refills | Status: DC

## 2022-09-25 MED ORDER — DILTIAZEM HCL ER COATED BEADS 120 MG PO CP24: 240.0000 mg | ORAL_CAPSULE | Freq: Every day | ORAL | 3 refills | Status: DC

## 2022-09-29 ENCOUNTER — Telehealth: Payer: Self-pay | Admitting: Acute Care

## 2022-09-29 DIAGNOSIS — M48061 Spinal stenosis, lumbar region without neurogenic claudication: Secondary | ICD-10-CM | POA: Diagnosis not present

## 2022-09-29 NOTE — Telephone Encounter (Signed)
Pt wants to know if there is anything he has to take before LCS

## 2022-09-30 NOTE — Telephone Encounter (Signed)
Sarah- please advise. °

## 2022-09-30 NOTE — Telephone Encounter (Signed)
Advised patient per Maralyn Sago nothing was needed prior to LCS. He verbalized understanding. NFN

## 2022-10-06 ENCOUNTER — Ambulatory Visit
Admission: RE | Admit: 2022-10-06 | Discharge: 2022-10-06 | Disposition: A | Discharge status: Home / Self Care | Payer: Medicare Other | Source: Ambulatory Visit | Attending: Family Medicine | Admitting: Family Medicine

## 2022-10-06 DIAGNOSIS — Z87891 Personal history of nicotine dependence: Secondary | ICD-10-CM

## 2022-10-06 DIAGNOSIS — Z122 Encounter for screening for malignant neoplasm of respiratory organs: Secondary | ICD-10-CM

## 2022-10-06 DIAGNOSIS — I7121 Aneurysm of the ascending aorta, without rupture: Secondary | ICD-10-CM | POA: Diagnosis not present

## 2022-10-06 DIAGNOSIS — I700 Atherosclerosis of aorta: Secondary | ICD-10-CM | POA: Diagnosis not present

## 2022-10-06 DIAGNOSIS — J439 Emphysema, unspecified: Secondary | ICD-10-CM | POA: Diagnosis not present

## 2022-10-09 ENCOUNTER — Other Ambulatory Visit: Payer: Self-pay | Admitting: Acute Care

## 2022-10-09 DIAGNOSIS — Z87891 Personal history of nicotine dependence: Secondary | ICD-10-CM

## 2022-10-09 DIAGNOSIS — Z122 Encounter for screening for malignant neoplasm of respiratory organs: Secondary | ICD-10-CM

## 2022-10-14 DIAGNOSIS — H524 Presbyopia: Secondary | ICD-10-CM | POA: Diagnosis not present

## 2022-10-18 DIAGNOSIS — H5203 Hypermetropia, bilateral: Secondary | ICD-10-CM | POA: Diagnosis not present

## 2022-11-04 ENCOUNTER — Telehealth: Payer: Self-pay | Admitting: *Deleted

## 2022-11-04 NOTE — Telephone Encounter (Signed)
Received a fax from [facility] stating Olmesartan 5 mg is on back order.  Call placed to the pharmacy to confirm.  Discussed sending in 20 mg taking 1/2 tablet daily, per pharmacists, cannot cut the 20 mg tablet in 1/2 as it is not scored.  Will send to the Prescribing Provider to see what she recommends.

## 2022-11-05 ENCOUNTER — Ambulatory Visit (HOSPITAL_BASED_OUTPATIENT_CLINIC_OR_DEPARTMENT_OTHER): Payer: Medicare Other | Attending: Neurological Surgery | Admitting: Physical Therapy

## 2022-11-05 ENCOUNTER — Encounter (HOSPITAL_BASED_OUTPATIENT_CLINIC_OR_DEPARTMENT_OTHER): Payer: Self-pay | Admitting: Physical Therapy

## 2022-11-05 ENCOUNTER — Other Ambulatory Visit: Payer: Self-pay

## 2022-11-05 DIAGNOSIS — M545 Low back pain, unspecified: Secondary | ICD-10-CM | POA: Diagnosis not present

## 2022-11-05 DIAGNOSIS — R600 Localized edema: Secondary | ICD-10-CM | POA: Insufficient documentation

## 2022-11-05 DIAGNOSIS — M6281 Muscle weakness (generalized): Secondary | ICD-10-CM | POA: Diagnosis not present

## 2022-11-05 DIAGNOSIS — R262 Difficulty in walking, not elsewhere classified: Secondary | ICD-10-CM | POA: Diagnosis not present

## 2022-11-05 NOTE — Therapy (Signed)
OUTPATIENT PHYSICAL THERAPY THORACOLUMBAR EVALUATION   Patient Name: Philip Winters MRN: [id number] DOB:1960-07-01, 62 y.o., male Today's Date: 11/05/2022  END OF SESSION:  PT End of Session - 11/05/22 4098     Visit Number 1    Number of Visits 6    Date for PT Re-Evaluation 12/17/22    PT Start Time 0845    PT Stop Time 0930    PT Time Calculation (min) 45 min    Activity Tolerance Patient tolerated treatment well    Behavior During Therapy [facility] for tasks assessed/performed             Past Medical History:  Diagnosis Date   Allergy    Arthritis    CAD (coronary artery disease)    GERD (gastroesophageal reflux disease)    occ   Heart murmur    baby   High cholesterol    Hypertension    Peripheral neuropathy    Pneumonia    Right carotid bruit 06/10/2017   Sciatica    Seasonal allergies    Sinus tachycardia    Spinal stenosis    Past Surgical History:  Procedure Laterality Date   CARDIAC CATHETERIZATION     CARPAL TUNNEL WITH CUBITAL TUNNEL Left 11/10/2013   Procedure: LEFT CARPAL TUNNEL RELEASE;  Surgeon: Wyn Forster, MD;  Location: [facility];  Service: Orthopedics;  Laterality: Left;   COLONOSCOPY  2023   CORONARY ARTERY BYPASS GRAFT N/A 05/01/2020   Procedure: CORONARY ARTERY BYPASS GRAFTING (CABG) X 2 ON CARDIOPULMONARY BYPASS. LIMA TO LAD, SVG TO DIAG;  Surgeon: Kerin Perna, MD;  Location: [facility] OR;  Service: Open Heart Surgery;  Laterality: N/A;   ENDOVEIN HARVEST OF GREATER SAPHENOUS VEIN Right 05/01/2020   Procedure: ENDOVEIN HARVEST OF GREATER SAPHENOUS VEIN;  Surgeon: Kerin Perna, MD;  Location: [facility] OR;  Service: Open Heart Surgery;  Laterality: Right;   KNEE ARTHROSCOPY  1191,4782   left and right   LUMBAR LAMINECTOMY/DECOMPRESSION MICRODISCECTOMY Left 09/29/2017   Procedure: LEFT LUMBAR TWO - LUMBAR THREELAMINOTOMY/MICRODISCECTOMY;  Surgeon: Shirlean Kelly, MD;  Location: [facility] OR;  Service: Neurosurgery;  Laterality: Left;   LEFT LUMBAR 2- LUMBAR 3 LAMINOTOMY/MICRODISCECTOMY   NASAL SINUS SURGERY  05/2015   RIGHT/LEFT HEART CATH AND CORONARY ANGIOGRAPHY N/A 04/10/2020   Procedure: RIGHT/LEFT HEART CATH AND CORONARY ANGIOGRAPHY;  Surgeon: Lennette Bihari, MD;  Location: [facility];  Service: Cardiovascular;  Laterality: N/A;   TEE WITHOUT CARDIOVERSION N/A 05/01/2020   Procedure: TRANSESOPHAGEAL ECHOCARDIOGRAM (TEE);  Surgeon: Donata Clay, Theron Arista, MD;  Location: [facility] OR;  Service: Open Heart Surgery;  Laterality: N/A;   TOTAL KNEE ARTHROPLASTY Right 06/24/2016   Procedure: TOTAL KNEE ARTHROPLASTY;  Surgeon: Valeria Batman, MD;  Location: [facility] OR;  Service: Orthopedics;  Laterality: Right;   TOTAL KNEE ARTHROPLASTY Left 12/18/2020   Procedure: LEFT TOTAL KNEE ARTHROPLASTY;  Surgeon: Valeria Batman, MD;  Location: [facility] ORS;  Service: Orthopedics;  Laterality: Left;   TRANSFORAMINAL LUMBAR INTERBODY FUSION W/ MIS 2 LEVEL Left 04/15/2022   Procedure: Minimally Invasive Surgery Decompression and Transforaminal Lumbar Interbody Fusion , Left , Lumbar three-four, Lumbar four-five;  Surgeon: Dawley, Alan Mulder, DO;  Location: [facility] OR;  Service: Neurosurgery;  Laterality: Left;   TRIGGER FINGER RELEASE Left 11/10/2013   Procedure: LEFT INDEX A-1 PULLEY RELEASE;  Surgeon: Wyn Forster, MD;  Location: [facility];  Service: Orthopedics;  Laterality: Left;   ULNAR NERVE TRANSPOSITION Left 11/10/2013  Procedure: ULNAR NERVE TRANSPOSITION;  Surgeon: Wyn Forster, MD;  Location: [facility];  Service: Orthopedics;  Laterality: Left;   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) 09/22/2022   Encounter for general adult medical examination with abnormal findings 09/22/2022   Encounter for screening for HIV 09/18/2022   Need for hepatitis C screening test 09/18/2022   Numbness in feet 09/18/2022   Tachycardia 09/18/2022    Dyslipidemia, goal LDL below 70 09/18/2022   Idiopathic small fiber peripheral neuropathy 09/18/2022   Chronic hyperglycemia 09/18/2022   Lumbar spinal stenosis 04/15/2022   Chronic pain of left thumb 02/25/2022   SBE (subacute bacterial endocarditis) prophylaxis candidate 12/16/2021   Cervical radiculopathy 08/13/2021   Ulnar neuropathy 08/13/2021   Screen for colon cancer 06/06/2021   Chronic tachycardia 06/04/2021   Candidal balanitis 06/04/2021   Carpal tunnel syndrome, bilateral upper limbs 09/26/2020   Cubital tunnel syndrome on left 08/23/2020   Pulmonary nodule 1 cm or greater in diameter 04/26/2020   Statin intolerance 04/20/2020   Coronary atherosclerosis of native coronary artery 04/18/2020   Nonrheumatic aortic valve stenosis    Bicuspid aortic valve 04/06/2020   Primary osteoarthritis of left knee 11/08/2019   Bilateral primary osteoarthritis of knee 09/28/2019   HNP (herniated nucleus pulposus), lumbar 09/29/2017   Unilateral primary osteoarthritis, right knee 06/24/2016   Primary hypertension 01/18/2007    PCP: Sanda Linger   REFERRING PROVIDER: Dr Kendell Bane Dawley   REFERRING DIAG: Low Back Pain   Rationale for Evaluation and Treatment: Rehabilitation  THERAPY DIAG:  Pain, lumbar region  Muscle weakness (generalized)  Difficulty walking  Localized edema  ONSET DATE: 04/15/22   SUBJECTIVE:                                                                                                                                                                                           SUBJECTIVE STATEMENT:   PERTINENT HISTORY:  04/15/22    Minimally invasive L 3-4, L4-5 decompression and transforaminal lumbar interbody fusion and arthrodesis, left sided approach Bilateral L3, L4, L5 segmental pedicle screw instrumentation, K2 M 7.5 x 50 mm bilaterally at L3, L4, L5 Placement of anterior biomechanical device, L3-4: [place] titanium K2 M interbody 10 x 28 mm; L4-5:  [place] titanium K2 M interbody 28 x 14 mm Intraoperative use of autograft, same incision Intraoperative use of allograft  Intraoperative use of fluoroscopy, greater than 1 hour Intraoperative use of microscope, for microdissection Bilateral TKA; numbness in feet   PAIN:  Are you having pain? Yes: NPRS scale: 7-8/10 at  worst Not much right now  Pain location: Left side of the lower back  Pain description: aching  Aggravating factors: sitting  Relieving factors: standing and walking   PRECAUTIONS: None  WEIGHT BEARING RESTRICTIONS: No  FALLS:  Has patient fallen in last 6 months? No  LIVING ENVIRONMENT: 1 step into the house  OCCUPATION:   Retired   Hobbies:  Comes to [employer]   PLOF: Independent  PATIENT GOALS: = To reduce pain in his back   NEXT MD VISIT:  November   OBJECTIVE:   DIAGNOSTIC FINDINGS:  Nothing post-op   PATIENT SURVEYS:  FOTO    SCREENING FOR RED FLAGS: Bowel or bladder incontinence: No Spinal tumors: No Cauda equina syndrome: No Compression fracture: No Abdominal aneurysm: No  COGNITION: Overall cognitive status: Within functional limits for tasks assessed     SENSATION: No pain down the leg   MUSCLE LENGTH:  POSTURE: stands in flexed posture   PALPATION: Spasmin in left upper gluteal and paraspinal   LUMBAR ROM:   AROM eval  Flexion   Extension   Right lateral flexion   Left lateral flexion   Right rotation   Left rotation    (Blank rows = not tested)  LOWER EXTREMITY ROM:     Passive  Right eval Left eval  Hip flexion    Hip extension    Hip abduction    Hip adduction    Hip internal rotation    Hip external rotation    Knee flexion    Knee extension    Ankle dorsiflexion    Ankle plantarflexion    Ankle inversion    Ankle eversion     (Blank rows = not tested)  LOWER EXTREMITY MMT:    MMT Right eval Left eval  Hip flexion 25.3 20  Hip extension    Hip abduction 33.1 36.7  Hip  adduction    Hip internal rotation    Hip external rotation    Knee flexion    Knee extension 49.6 49.0  Ankle dorsiflexion    Ankle plantarflexion    Ankle inversion    Ankle eversion     (Blank rows = not tested)  GAIT:  TODAY'S TREATMENT:                                                                                                                              DATE:  Trigger Point Dry-Needling  Treatment instructions: Expect mild to moderate muscle soreness. S/S of pneumothorax if dry needled over a lung field, and to seek immediate medical attention should they occur. Patient verbalized understanding of these instructions and education.  Patient Consent Given: Yes Education handout provided: Yes Muscles treated:  left gluteal 3 spots using a .75x30 needle  Electrical stimulation performed: No Parameters: N/A Treatment response/outcome: great twitch      PATIENT EDUCATION:  Education details: HEP, symptom management  Person educated: Patient Education method: Explanation,  Demonstration, Tactile cues, Verbal cues, and Handouts Education comprehension: verbalized understanding, returned demonstration, verbal cues required, tactile cues required, and needs further education  HOME EXERCISE PROGRAM: [id number]   ASSESSMENT:  CLINICAL IMPRESSION: The patient is a 62 year old male who presents to PT for follow following a previous episode of therapy for a lumbar fusion. At this time he has had a progressive increase in spasming in the left gluteal. He has tried stretching and self manual techniques with no relief. He has a spam in his left gluteal. He has pain with extension He is walking in slight flexion. He would benefit from skilled therapy to reduce spasming in gluteal and improve overall posture.   OBJECTIVE IMPAIRMENTS: Abnormal gait, decreased ROM, decreased strength, increased fascial restrictions, increased muscle spasms, impaired flexibility, and pain.   ACTIVITY  LIMITATIONS: carrying, bending, sitting, and locomotion level  PARTICIPATION LIMITATIONS: driving, community activity, and yard work  PERSONAL FACTORS: 1-2 comorbidities: bilateral TKA   are also affecting patient's functional outcome.   REHAB POTENTIAL: Good  CLINICAL DECISION MAKING: Evolving/moderate complexity pain has recentyl been limiting his ability to do stairs   EVALUATION COMPLEXITY: Moderate   GOALS: Goals reviewed with patient? Yes  SHORT TERM GOALS: Target date:   Patient will increase left hip flexor strength by 5lbs  Baseline: Goal status: INITIAL  2.  Patient will report a 50%reduction in pain in spamsing with TPDN  Baseline:  Goal status: INITIAL  3.  Patient will come to neutral extension without increased pain  Baseline:  Goal status: INITIAL   LONG TERM GOALS: Target date: 12/17/2022    Patient will sit for  one hour without increased pain  Baseline:  Goal status: INITIAL  2.  Patient will have a complete gym program with proper loading Baseline:  Goal status: INITIAL   PLAN:  PT FREQUENCY: 2x/week  PT DURATION: 8 weeks  PLANNED INTERVENTIONS: Therapeutic exercises, Therapeutic activity, Neuromuscular re-education, Balance training, Gait training, Patient/Family education, Self Care, Joint mobilization, Aquatic Therapy, Dry Needling, Cryotherapy, Moist heat, Ultrasound, and Manual therapy.  PLAN FOR NEXT SESSION: TPDN not gluteal. Had a prior laminectomy so nothing in the low back. Manual therapy to low back. Review gym exercises. He reports recently his low back pain has increased with his exercises.    Dessie Coma, PT 11/05/2022, 12:57 PM

## 2022-11-05 NOTE — Telephone Encounter (Addendum)
Spoke with patient and he states that he will contact [facility] and see if they have it in stock and request that the rx be transferred. Per pt he was refilling the 20 mg tablets with [facility] previously and taking 1/2 daily, not sure why they are now saying that this isn't allowed. He will call us back if anything further is needed from our end.

## 2022-11-06 ENCOUNTER — Telehealth: Payer: Self-pay

## 2022-11-06 NOTE — Telephone Encounter (Signed)
Pt's pharmacy [facility] stating that Pt's medication olmesartan 5 mg tablets are on back order and the pt is aware. Is it possible for the provider to send a new Rx for 20 mg so that pt can cut it in half? 20 mg tablets are currently available on pharmacy's shelf. Would Dr. Anne Fu like to reorder as requested? Please address

## 2022-11-07 ENCOUNTER — Other Ambulatory Visit: Payer: Self-pay | Admitting: *Deleted

## 2022-11-07 MED ORDER — OLMESARTAN MEDOXOMIL 20 MG PO TABS: 10.0000 mg | ORAL_TABLET | Freq: Every day | ORAL | 1 refills | Status: DC

## 2022-11-07 NOTE — Telephone Encounter (Signed)
LMOM (okay per dpr) informing him of this.

## 2022-11-07 NOTE — Telephone Encounter (Signed)
See phone note, pt prefers not to switch pharmacies. Will send in rx for 20 mg tablet as requested with sig take 1/2 tablet (10 mg) daily.

## 2022-11-12 ENCOUNTER — Encounter (HOSPITAL_BASED_OUTPATIENT_CLINIC_OR_DEPARTMENT_OTHER): Payer: Self-pay

## 2022-11-12 ENCOUNTER — Ambulatory Visit (HOSPITAL_BASED_OUTPATIENT_CLINIC_OR_DEPARTMENT_OTHER): Payer: Medicare Other | Admitting: Physical Therapy

## 2022-11-19 ENCOUNTER — Encounter (HOSPITAL_BASED_OUTPATIENT_CLINIC_OR_DEPARTMENT_OTHER): Payer: Medicare Other | Admitting: Physical Therapy

## 2022-11-27 ENCOUNTER — Other Ambulatory Visit: Payer: Self-pay | Admitting: Podiatry

## 2022-11-27 ENCOUNTER — Encounter (HOSPITAL_BASED_OUTPATIENT_CLINIC_OR_DEPARTMENT_OTHER): Payer: Medicare Other | Admitting: Physical Therapy

## 2022-11-27 ENCOUNTER — Ambulatory Visit (INDEPENDENT_AMBULATORY_CARE_PROVIDER_SITE_OTHER): Payer: Medicare Other

## 2022-11-27 ENCOUNTER — Encounter: Payer: Self-pay | Admitting: Podiatry

## 2022-11-27 ENCOUNTER — Ambulatory Visit (INDEPENDENT_AMBULATORY_CARE_PROVIDER_SITE_OTHER): Payer: Medicare Other | Admitting: Podiatry

## 2022-11-27 DIAGNOSIS — M7751 Other enthesopathy of right foot: Secondary | ICD-10-CM

## 2022-11-27 DIAGNOSIS — M7752 Other enthesopathy of left foot: Secondary | ICD-10-CM

## 2022-11-27 DIAGNOSIS — R200 Anesthesia of skin: Secondary | ICD-10-CM

## 2022-11-27 NOTE — Progress Notes (Signed)
Subjective:   Patient ID: Philip Winters, male   DOB: 62 y.o.   MRN: [id number]   HPI Patient presents with discomfort more in his forefoot bilateral with inflammation mostly around the lesser MPJs bilateral.  Patient has had significant back problems did have surgery in November but seems to be improving somewhat his condition as far as tingling burning and has also had fascial inflammation in the past   ROS      Objective:  Physical Exam  Neurovascular status unchanged moderate diminishment sharp dull vibratory with inflammation pain mostly centered around the third MPJ of both feet fluid buildup with patient having moderate exposure of the metatarsal heads bilateral     Assessment:  Inflammatory capsulitis bilateral with prominent metatarsal heads bilateral diminished fat pad and history of neuropathy     Plan:  H&P reviewed different conditions and for neuropathy were given increase his Lyrica to 50 mg at night as that is when the symptoms are worse.  I did do sterile prep injected around the third and fourth MPJ periarticular bilateral 3 mg dexamethasone Kenalog 5 mg Xylocaine and I casted for functional orthotics to offload weight off the MPJs themselves with soft cushion materials.  Patient will be seen back when those are returned may require other treatments depending on response to medication and increase in Lyrica at night  X-rays indicate minimal signs of arthritis no indication stress fracture or other bone pathology associated with the discomfort and painful condition

## 2022-12-08 ENCOUNTER — Encounter: Payer: Self-pay | Admitting: Family Medicine

## 2022-12-08 ENCOUNTER — Ambulatory Visit (INDEPENDENT_AMBULATORY_CARE_PROVIDER_SITE_OTHER): Payer: Medicare Other

## 2022-12-08 ENCOUNTER — Ambulatory Visit (INDEPENDENT_AMBULATORY_CARE_PROVIDER_SITE_OTHER): Payer: Medicare Other | Admitting: Family Medicine

## 2022-12-08 VITALS — BP 126/76 | HR 90 | Temp 98.4°F | Resp 20 | Ht 68.0 in | Wt 152.0 lb

## 2022-12-08 DIAGNOSIS — R109 Unspecified abdominal pain: Secondary | ICD-10-CM

## 2022-12-08 LAB — POCT URINALYSIS DIPSTICK
Bilirubin, UA: NEGATIVE
Blood, UA: NEGATIVE
Glucose, UA: NEGATIVE
Ketones, UA: NEGATIVE
Nitrite, UA: NEGATIVE
Protein, UA: POSITIVE — AB
Spec Grav, UA: 1.015 (ref 1.010–1.025)
Urobilinogen, UA: NEGATIVE E.U./dL — AB
pH, UA: 7 (ref 5.0–8.0)

## 2022-12-08 MED ORDER — IBUPROFEN 600 MG PO TABS
600.0000 mg | ORAL_TABLET | Freq: Three times a day (TID) | ORAL | 0 refills | Status: DC | PRN
Start: 2022-12-08 — End: 2023-07-16

## 2022-12-08 NOTE — Progress Notes (Signed)
Assessment & Plan:  1-2. Right flank pain/Right sided abdominal pain Education provided on flank pain.  - POCT urinalysis dipstick - CT RENAL STONE STUDY; Future - ibuprofen (ADVIL) 600 MG tablet; Take 1 tablet (600 mg total) by mouth every 8 (eight) hours as needed for moderate pain.  Dispense: 30 tablet; Refill: 0 - DG Abd 1 View; Future  Results for orders placed or performed in visit on 12/08/22  POCT urinalysis dipstick  Result Value Ref Range   Color, UA yellow    Clarity, UA clear    Glucose, UA Negative Negative   Bilirubin, UA neg    Ketones, UA neg    Spec Grav, UA 1.015 1.010 - 1.025   Blood, UA neg    pH, UA 7.0 5.0 - 8.0   Protein, UA Positive (A) Negative   Urobilinogen, UA negative (A) 0.2 or 1.0 E.U./dL   Nitrite, UA neg    Leukocytes, UA Small (1+) (A) Negative   Appearance     Odor      Follow up plan: Return if symptoms worsen or fail to improve.  Deliah Boston, MSN, APRN, FNP-C  Subjective:  HPI: Philip Winters is a 62 y.o. male presenting on 12/08/2022 for Back Pain (Right side - started 2 weeks ago /Had surgery in NOV 2023- [place]//Drinks plenty of water - no known urine s/s//)  Patient reports right-sided back pain that wraps around to his lower abdomen.  States it does not feel like a muscle pain and it is not in his spine.  He is unable to sit and then straighten up or it takes his breath away.  It does not bother him when he is sleeping.  The pain has gotten worse over the past 2 weeks since it started.  He denies any hematuria, fever, nausea, or vomiting.  He does drink a lots of water, and states he is not urinating as much as he thinks he should.   ROS: Negative unless specifically indicated above in HPI.   Relevant past medical history reviewed and updated as indicated.   Allergies and medications reviewed and updated.   Current Outpatient Medications:    acetaminophen (TYLENOL) 500 MG tablet, Take 500-1,000 mg by mouth  every 6 (six) hours as needed (pain.)., Disp: , Rfl:    Alirocumab (PRALUENT) 75 MG/ML SOAJ, Inject 75 mg into the skin every 14 (fourteen) days., Disp: 2 mL, Rfl: 11   aspirin EC 81 MG tablet, Take 1 tablet (81 mg total) by mouth in the morning. Swallow whole., Disp: 30 tablet, Rfl: 12   clobetasol cream (TEMOVATE) 0.05 %, Apply 1 Application topically 2 (two) times daily as needed (skin irritation/rash.)., Disp: , Rfl:    diltiazem (CARDIZEM CD) 120 MG 24 hr capsule, Take 2 capsules (240 mg total) by mouth daily., Disp: 180 capsule, Rfl: 3   methocarbamol (ROBAXIN) 500 MG tablet, Take 1 tablet (500 mg total) by mouth every 6 (six) hours as needed for muscle spasms., Disp: 90 tablet, Rfl: 2   mupirocin ointment (BACTROBAN) 2 %, Apply to affected areas up to 3 times daily as needed, Disp: 22 g, Rfl: 3   olmesartan (BENICAR) 20 MG tablet, Take 0.5 tablets (10 mg total) by mouth daily., Disp: 45 tablet, Rfl: 1   Polyethyl Glycol-Propyl Glycol (LUBRICANT EYE DROPS) 0.4-0.3 % SOLN, Place 1 drop into both eyes 3 (three) times daily as needed (dry/irritated eyes)., Disp: , Rfl:    pregabalin (LYRICA) 25 MG capsule, Take  1 capsule (25 mg total) by mouth 3 (three) times daily., Disp: 270 capsule, Rfl: 0   fluticasone (FLONASE) 50 MCG/ACT nasal spray, Place 2 sprays into both nostrils in the morning. (Patient not taking: Reported on 12/08/2022), Disp: , Rfl:    Multiple Vitamin (MULTIVITAMIN WITH MINERALS) TABS tablet, Take 1 tablet by mouth in the morning. (Patient not taking: Reported on 12/08/2022), Disp: , Rfl:   Allergies  Allergen Reactions   Mushroom Extract Complex Anaphylaxis   Penicillins Shortness Of Breath    Childhood allergy.  Has patient had a PCN reaction causing immediate rash, facial/tongue/throat swelling, SOB or lightheadedness with hypotension: No Has patient had a PCN reaction causing severe rash involving mucus membranes or skin necrosis: No Has patient had a PCN reaction that  required hospitalization No Has patient had a PCN reaction occurring within the last 10 years: No If all of the above answers are "NO", then may proceed with C   Chantix [Varenicline] Other (See Comments)    headaches    Crestor [Rosuvastatin] Other (See Comments)    myalgia   Dilaudid [Hydromorphone] Nausea And Vomiting   Lipitor [Atorvastatin] Other (See Comments)    myalgia   Losartan Potassium Other (See Comments)    Lethargic    Lovaza [Omega-3-Acid Ethyl Esters] Other (See Comments)    myalgia   Oxycodone Hcl Other (See Comments)    hallucinations   Pravachol [Pravastatin] Other (See Comments)    myalgia   Bystolic [Nebivolol Hcl] Other (See Comments)    Extreme fatigue, not able to focus   Codeine Nausea And Vomiting   Iodine Rash   Iohexol Rash   Lopressor [Metoprolol] Other (See Comments)    Made patient very fatigued, could not focus   Red Dye Rash   Repatha [Evolocumab] Rash and Other (See Comments)    Hip pain and rash    Objective:   BP 126/76   Pulse 90   Temp 98.4 F (36.9 C)   Resp 20   Ht 5\' 8"  (1.727 m)   Wt 152 lb (68.9 kg)   BMI 23.11 kg/m    Physical Exam Vitals reviewed.  Constitutional:      General: He is not in acute distress.    Appearance: Normal appearance. He is not ill-appearing, toxic-appearing or diaphoretic.  HENT:     Head: Normocephalic and atraumatic.  Eyes:     General: No scleral icterus.       Right eye: No discharge.        Left eye: No discharge.     Conjunctiva/sclera: Conjunctivae normal.  Cardiovascular:     Rate and Rhythm: Normal rate.  Pulmonary:     Effort: Pulmonary effort is normal. No respiratory distress.  Abdominal:     General: Abdomen is flat.     Palpations: Abdomen is soft.  Musculoskeletal:        General: Normal range of motion.     Cervical back: Normal range of motion.     Comments: Unable to palpate pain as patient reports it is in there deep.   Skin:    General: Skin is warm and dry.   Neurological:     Mental Status: He is alert and oriented to person, place, and time. Mental status is at baseline.  Psychiatric:        Mood and Affect: Mood normal.        Behavior: Behavior normal.        Thought Content: Thought content normal.  Judgment: Judgment normal.

## 2022-12-09 ENCOUNTER — Encounter: Payer: Self-pay | Admitting: Family Medicine

## 2022-12-10 ENCOUNTER — Ambulatory Visit
Admission: RE | Admit: 2022-12-10 | Discharge: 2022-12-10 | Disposition: A | Discharge status: Home / Self Care | Payer: Medicare Other | Source: Ambulatory Visit | Attending: Family Medicine | Admitting: Family Medicine

## 2022-12-10 DIAGNOSIS — K5790 Diverticulosis of intestine, part unspecified, without perforation or abscess without bleeding: Secondary | ICD-10-CM | POA: Diagnosis not present

## 2022-12-10 DIAGNOSIS — R109 Unspecified abdominal pain: Secondary | ICD-10-CM

## 2022-12-10 DIAGNOSIS — I700 Atherosclerosis of aorta: Secondary | ICD-10-CM | POA: Diagnosis not present

## 2022-12-11 ENCOUNTER — Telehealth: Payer: Self-pay | Admitting: Podiatry

## 2022-12-11 NOTE — Telephone Encounter (Signed)
Lmom for pt to call back to schedule picking up orthotics    Balance is 490.00

## 2022-12-15 ENCOUNTER — Encounter: Payer: Self-pay | Admitting: Podiatry

## 2022-12-15 DIAGNOSIS — M5136 Other intervertebral disc degeneration, lumbar region: Secondary | ICD-10-CM | POA: Diagnosis not present

## 2022-12-15 DIAGNOSIS — M4326 Fusion of spine, lumbar region: Secondary | ICD-10-CM | POA: Diagnosis not present

## 2022-12-15 DIAGNOSIS — M48061 Spinal stenosis, lumbar region without neurogenic claudication: Secondary | ICD-10-CM | POA: Diagnosis not present

## 2022-12-17 ENCOUNTER — Telehealth: Payer: Self-pay | Admitting: Cardiology

## 2022-12-17 NOTE — Telephone Encounter (Signed)
Pt called in asking if Dr. Anne Fu would like for pt to have labs drawn prior to his appt tomorrow.

## 2022-12-18 ENCOUNTER — Encounter: Payer: Self-pay | Admitting: Cardiology

## 2022-12-18 ENCOUNTER — Ambulatory Visit: Payer: Medicare Other | Attending: Cardiology | Admitting: Cardiology

## 2022-12-18 VITALS — BP 122/80 | HR 92 | Ht 68.0 in | Wt 150.4 lb

## 2022-12-18 DIAGNOSIS — I10 Essential (primary) hypertension: Secondary | ICD-10-CM | POA: Diagnosis not present

## 2022-12-18 DIAGNOSIS — Q231 Congenital insufficiency of aortic valve: Secondary | ICD-10-CM | POA: Diagnosis not present

## 2022-12-18 DIAGNOSIS — I700 Atherosclerosis of aorta: Secondary | ICD-10-CM

## 2022-12-18 DIAGNOSIS — I2510 Atherosclerotic heart disease of native coronary artery without angina pectoris: Secondary | ICD-10-CM | POA: Diagnosis not present

## 2022-12-18 NOTE — Progress Notes (Signed)
  Cardiology Office Note:  .   Date:  12/18/2022  ID:  Philip Winters, DOB 1960/07/13, MRN [id number] PCP: Etta Grandchild, MD  [facility] Providers Cardiologist:  Donato Schultz, MD     History of Present Illness: Philip Winters is a 62 y.o. male here for the follow-up of coronary artery disease status post CABG in December 2021 x 2 LIMA to LAD (proximal LAD 90%) and SVG to diagonal, Dr. Maren Beach.  Had a prior high risk nuclear stress test.  Following bicuspid aortic valve, 4.1 cm aortic root.  This did not require replacement at time of bypass.  Retired as a Nutritional therapist.  Back surgery.  Usually heart rate is a little bit on the faster side.   Walking dialy, gym    ROS: No CP.  Does have some mild left arm tingling at times neuropathy.  Also solar purpura left forearm.  Studies Reviewed: Marland Kitchen   EKG Interpretation Date/Time:  Thursday December 18 2022 09:03:29 EDT Ventricular Rate:  92 PR Interval:  160 QRS Duration:  88 QT Interval:  346 QTC Calculation: 427 R Axis:   -3  Text Interpretation: Normal sinus rhythm Possible Left atrial enlargement When compared with ECG of 10-Oct-2021 10:49, No significant change was found Confirmed by Donato Schultz ([id number]) on 12/18/2022 9:10:17 AM    Echocardiogram 06/2022-EF 65%, bicuspid aortic valve  moderate aortic valve stenosis Vmax 3.4 m/s, MG 26 mmMG, EOA 1.06  cm2, DI 0.26. 4.1 aortic root  Risk Assessment/Calculations:            Physical Exam:   VS:  BP 122/80   Pulse 92   Ht 5\' 8"  (1.727 m)   Wt 150 lb 6.4 oz (68.2 kg)   SpO2 98%   BMI 22.87 kg/m    Wt Readings from Last 3 Encounters:  12/18/22 150 lb 6.4 oz (68.2 kg)  12/08/22 152 lb (68.9 kg)  09/18/22 147 lb (66.7 kg)    GEN: Well nourished, well developed in no acute distress NECK: No JVD; No carotid bruits CARDIAC: RRR, no murmurs, rubs, gallops RESPIRATORY:  Clear to auscultation without rales, wheezing or rhonchi  ABDOMEN: Soft, non-tender, non-distended EXTREMITIES:   No edema; No deformity   ASSESSMENT AND PLAN: .    Coronary artery disease status post CABG 2021 - Overall doing well on goal-directed medical therapy which includes aspirin diltiazem olmesartan.  Bicuspid aortic valve - Continuing to monitor.  Moderate aortic stenosis 2024.  Did not require replacement at time of bypass.  Aortic root 4.1 cm monitoring as well.  Primary hypertension - Well-controlled.  Medication management as above.  Hyperlipidemia - On PCSK9 inhibitor, Praluent.  Prior LDL 44.  Excellent.  Has assistance program  Chronic tachycardia - Heart rate sometimes can be between 101 15.  Asymptomatic.  Does not like to feel slower heart rate such as in the 80s when he was on metoprolol.      Dispo: 1 yr  Signed, Donato Schultz, MD

## 2022-12-18 NOTE — Telephone Encounter (Signed)
I spoke with the pt and he has an appt this morning with Dr Anne Fu.

## 2022-12-18 NOTE — Patient Instructions (Signed)
Medication Instructions:  Your physician recommends that you continue on your current medications as directed. Please refer to the Current Medication list given to you today. *If you need a refill on your cardiac medications before your next appointment, please call your pharmacy*  Lab Work: If you have labs (blood work) drawn today and your tests are completely normal, you will receive your results only by: [place] (if you have MyChart) OR A paper copy in the mail If you have any lab test that is abnormal or we need to change your treatment, we will call you to review the results.  Testing/Procedures: None ordered today.  Follow-Up: At [facility], you and your health needs are our priority.  As part of our continuing mission to provide you with exceptional heart care, we have created designated Provider Care Teams.  These Care Teams include your primary Cardiologist (physician) and Advanced Practice Providers (APPs -  Physician Assistants and Nurse Practitioners) who all work together to provide you with the care you need, when you need it.  We recommend signing up for the patient portal called "MyChart".  Sign up information is provided on this After Visit Summary.  MyChart is used to connect with patients for Virtual Visits (Telemedicine).  Patients are able to view lab/test results, encounter notes, upcoming appointments, etc.  Non-urgent messages can be sent to your provider as well.   To learn more about what you can do with MyChart, go to [email or web address].    Your next appointment:   1 year(s)  Provider:   Candee Furbish, MD

## 2022-12-24 DIAGNOSIS — M545 Low back pain, unspecified: Secondary | ICD-10-CM | POA: Diagnosis not present

## 2022-12-24 DIAGNOSIS — M5134 Other intervertebral disc degeneration, thoracic region: Secondary | ICD-10-CM | POA: Diagnosis not present

## 2022-12-24 DIAGNOSIS — Z981 Arthrodesis status: Secondary | ICD-10-CM | POA: Diagnosis not present

## 2022-12-24 DIAGNOSIS — M48061 Spinal stenosis, lumbar region without neurogenic claudication: Secondary | ICD-10-CM | POA: Diagnosis not present

## 2022-12-26 ENCOUNTER — Ambulatory Visit: Payer: Medicare Other | Admitting: Orthopedic Surgery

## 2022-12-26 ENCOUNTER — Other Ambulatory Visit (INDEPENDENT_AMBULATORY_CARE_PROVIDER_SITE_OTHER): Payer: Medicare Other

## 2022-12-26 DIAGNOSIS — M19132 Post-traumatic osteoarthritis, left wrist: Secondary | ICD-10-CM

## 2022-12-26 DIAGNOSIS — M79645 Pain in left finger(s): Secondary | ICD-10-CM

## 2022-12-26 MED ORDER — LIDOCAINE HCL 1 % IJ SOLN
1.0000 mL | INTRAMUSCULAR | Status: AC | PRN
Start: 2022-12-26 — End: 2022-12-26
  Administered 2022-12-26: 1 mL

## 2022-12-26 MED ORDER — BETAMETHASONE SOD PHOS & ACET 6 (3-3) MG/ML IJ SUSP
6.0000 mg | INTRAMUSCULAR | Status: AC | PRN
Start: 2022-12-26 — End: 2022-12-26
  Administered 2022-12-26: 6 mg via INTRA_ARTICULAR

## 2022-12-26 NOTE — Progress Notes (Signed)
Philip Winters - 62 y.o. male MRN [id number]  Date of birth: Aug 21, 1960  Office Visit Note: Visit Date: 12/26/2022 PCP: Etta Grandchild, MD Referred by: Etta Grandchild, MD  Subjective: Chief Complaint  Patient presents with   Left Hand - Pain   HPI: Philip Winters is a pleasant 62 y.o. male who presents today for discussion regarding ongoing left wrist SLAC arthritis.  He was seen most recently by Dr. Roda Shutters in March of this year, underwent injection to the left radiocarpal joint with approximately 2 months of relief.  He presents today as a referral from Dr. Roda Shutters for further discussion regarding his wrist.  He is overall active at baseline, likes to workout and lift weights.  States that the pain at the left wrist is related to activity, grip.  Denies any significant numbness or tingling.  Does have a history of prior left carpal tunnel and cubital tunnel release approximately 10 years prior.  Visit Reason: Left wrist Hand dominance: right Occupation: Retired  Diabetic: No Heart/Lung History: double bypass 3years  Blood Thinners: baby aspirin  Prior Testing/EMG: xray 08/08/22 Injections (Date): 08/08/22 - months of relief Treatments: injection, brace, tylenol   Pertinent ROS were reviewed with the patient and found to be negative unless otherwise specified above in HPI.   Assessment & Plan: Visit Diagnoses:  1. Pain of left thumb     Plan: Extensive discussion was had with patient today regarding his left wrist SLAC arthritis.  Bilateral pronated grip views were obtained today, there is notable dynamic widening of the scapholunate interval.  This is associated with his clinical examination, which she does localize pain to the left wrist region with positive clunk and pain with Claudette Laws shift testing.  Given his ongoing pain, he does still have good function of the left wrist and appropriate grip strength currently.  For the time being, he would like to continue with conservative measures and  will consider surgical intervention in the near future.  We discussed extensively potential surgical treatment options in the form of proximal row carpectomy and possible allograft spacer versus 4 corner fusion of the left wrist.  We discussed risk and benefits of the surgeries at length as well as the postoperative protocol.  After discussion, we will move forward with cortisone injection to the left radiocarpal joint for symptom relief.  He will return in approximate 6 weeks for a recheck and potential surgical discussion.  I did explain that surgery would generally wait approximately 3 months from the most recent injection.  Patient expressed full understanding.  Greater than 45 minutes was spent today in the care of this patient.  Follow-up: Return in about 6 weeks (around 02/06/2023).   Meds & Orders: No orders of the defined types were placed in this encounter.   Orders Placed This Encounter  Procedures   Medium Joint Inj   XR Wrist 2 Views Left     Procedures: Medium Joint Inj: L radiocarpal on 12/26/2022 8:53 AM Indications: pain Details: 25 G needle, dorsal approach Medications: 1 mL lidocaine 1 %; 6 mg betamethasone acetate-betamethasone sodium phosphate 6 (3-3) MG/ML Outcome: tolerated well, no immediate complications Consent was given by the patient.          Clinical History: No specialty comments available.  He reports that he quit smoking about 6 years ago. His smoking use included cigarettes. He started smoking about 43 years ago. He has a 37 pack-year smoking history. He has never used smokeless  tobacco.  Recent Labs    09/18/22 1031  HGBA1C 5.6    Objective:   Vital Signs: There were no vitals taken for this visit.  Physical Exam  Gen: Well-appearing, in no acute distress; non-toxic CV: Regular Rate. Well-perfused. Warm.  Resp: Breathing unlabored on room air; no wheezing. Psych: Fluid speech in conversation; appropriate affect; normal thought  process Neuro: Sensation intact throughout. No gross coordination deficits.   Ortho Exam PHYSICAL EXAM:  General: Patient is well appearing and in no distress. Cervical spine mobility is full in all directions:  Range of Motion and Palpation Tests: Mobility is full about the elbows with flexion and extension.  Forearm supination and pronation are 85/85 bilaterally.  Wrist flexion/extension is 75/65 right, 55/35 left.  Digital flexion and extension are full.  Thumb opposition is full to the base of the small fingers bilaterally.    No cords or nodules are palpated.  No triggering is observed.    Moderate tenderness over the right thumb CMC articulation is observed.   Watson's shift testing on the left wrist is positive for clunk and pain, negative right  Neurologic, Vascular, Motor: Sensation is intact to light touch in the median/radial/ulnar distributions.  Tinel's testing negative at cubital and carpal tunnel.   Grip strength testing Jamar II right 60, left 60  Fingers pink and well perfused.  Capillary refill is brisk.      Lab Results  Component Value Date   HGBA1C 5.6 09/18/2022     Imaging: XR Wrist 2 Views Left  Result Date: 12/26/2022 Bilateral pronated grip views were obtained today Left wrist demonstrates dynamic scapholunate widening at the interval in comparison to the contralateral side.  There is notable radiocarpal arthritis to the left wrist as well, joint space narrowing significantly between the radial scaphoid interface.  Mild ulnar positive variance as well seen bilaterally.  Notable degenerative change seen at the thumb [facility] articulations bilaterally as well.   Past Medical/Family/Surgical/Social History: Medications & Allergies reviewed per EMR, new medications updated. 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) 09/22/2022   Encounter for general adult medical examination with abnormal  findings 09/22/2022   Encounter for screening for HIV 09/18/2022   Need for hepatitis C screening test 09/18/2022   Numbness in feet 09/18/2022   Tachycardia 09/18/2022   Dyslipidemia, goal LDL below 70 09/18/2022   Idiopathic small fiber peripheral neuropathy 09/18/2022   Chronic hyperglycemia 09/18/2022   Lumbar spinal stenosis 04/15/2022   Chronic pain of left thumb 02/25/2022   SBE (subacute bacterial endocarditis) prophylaxis candidate 12/16/2021   Cervical radiculopathy 08/13/2021   Ulnar neuropathy 08/13/2021   Screen for colon cancer 06/06/2021   Chronic tachycardia 06/04/2021   Candidal balanitis 06/04/2021   Carpal tunnel syndrome, bilateral upper limbs 09/26/2020   Cubital tunnel syndrome on left 08/23/2020   Pulmonary nodule 1 cm or greater in diameter 04/26/2020   Statin intolerance 04/20/2020   Coronary atherosclerosis of native coronary artery 04/18/2020   Nonrheumatic aortic valve stenosis    Bicuspid aortic valve 04/06/2020   Primary osteoarthritis of left knee 11/08/2019   Bilateral primary osteoarthritis of knee 09/28/2019   HNP (herniated nucleus pulposus), lumbar 09/29/2017   Unilateral primary osteoarthritis, right knee 06/24/2016   Primary hypertension 01/18/2007   Past Medical History:  Diagnosis Date   Allergy    Arthritis    CAD (coronary artery disease)    GERD (gastroesophageal reflux disease)  occ   Heart murmur    baby   High cholesterol    Hypertension    Peripheral neuropathy    Pneumonia    Right carotid bruit 06/10/2017   Sciatica    Seasonal allergies    Sinus tachycardia    Spinal stenosis    Family History  Problem Relation Age of Onset   Hypertension Mother    Throat cancer Mother    Hypertension Father    Cancer Father    Colon cancer Neg Hx    Esophageal cancer Neg Hx    Stomach cancer Neg Hx    Rectal cancer Neg Hx    Colon polyps Neg Hx    Past Surgical History:  Procedure Laterality Date   CARDIAC  CATHETERIZATION     CARPAL TUNNEL WITH CUBITAL TUNNEL Left 11/10/2013   Procedure: LEFT CARPAL TUNNEL RELEASE;  Surgeon: Wyn Forster, MD;  Location: [facility];  Service: Orthopedics;  Laterality: Left;   COLONOSCOPY  2023   CORONARY ARTERY BYPASS GRAFT N/A 05/01/2020   Procedure: CORONARY ARTERY BYPASS GRAFTING (CABG) X 2 ON CARDIOPULMONARY BYPASS. LIMA TO LAD, SVG TO DIAG;  Surgeon: Kerin Perna, MD;  Location: [facility] OR;  Service: Open Heart Surgery;  Laterality: N/A;   ENDOVEIN HARVEST OF GREATER SAPHENOUS VEIN Right 05/01/2020   Procedure: ENDOVEIN HARVEST OF GREATER SAPHENOUS VEIN;  Surgeon: Kerin Perna, MD;  Location: [facility] OR;  Service: Open Heart Surgery;  Laterality: Right;   KNEE ARTHROSCOPY  [phone]   left and right   LUMBAR LAMINECTOMY/DECOMPRESSION MICRODISCECTOMY Left 09/29/2017   Procedure: LEFT LUMBAR TWO - LUMBAR THREELAMINOTOMY/MICRODISCECTOMY;  Surgeon: Shirlean Kelly, MD;  Location: [facility];  Service: Neurosurgery;  Laterality: Left;  LEFT LUMBAR 2- LUMBAR 3 LAMINOTOMY/MICRODISCECTOMY   NASAL SINUS SURGERY  05/2015   RIGHT/LEFT HEART CATH AND CORONARY ANGIOGRAPHY N/A 04/10/2020   Procedure: RIGHT/LEFT HEART CATH AND CORONARY ANGIOGRAPHY;  Surgeon: Lennette Bihari, MD;  Location: [facility];  Service: Cardiovascular;  Laterality: N/A;   TEE WITHOUT CARDIOVERSION N/A 05/01/2020   Procedure: TRANSESOPHAGEAL ECHOCARDIOGRAM (TEE);  Surgeon: Donata Clay, Theron Arista, MD;  Location: [facility] OR;  Service: Open Heart Surgery;  Laterality: N/A;   TOTAL KNEE ARTHROPLASTY Right 06/24/2016   Procedure: TOTAL KNEE ARTHROPLASTY;  Surgeon: Valeria Batman, MD;  Location: [facility] OR;  Service: Orthopedics;  Laterality: Right;   TOTAL KNEE ARTHROPLASTY Left 12/18/2020   Procedure: LEFT TOTAL KNEE ARTHROPLASTY;  Surgeon: Valeria Batman, MD;  Location: [facility];  Service: Orthopedics;  Laterality: Left;   TRANSFORAMINAL LUMBAR INTERBODY FUSION W/ MIS 2 LEVEL Left 04/15/2022    Procedure: Minimally Invasive Surgery Decompression and Transforaminal Lumbar Interbody Fusion , Left , Lumbar three-four, Lumbar four-five;  Surgeon: Dawley, Alan Mulder, DO;  Location: [facility] OR;  Service: Neurosurgery;  Laterality: Left;   TRIGGER FINGER RELEASE Left 11/10/2013   Procedure: LEFT INDEX A-1 PULLEY RELEASE;  Surgeon: Wyn Forster, MD;  Location: [facility];  Service: Orthopedics;  Laterality: Left;   ULNAR NERVE TRANSPOSITION Left 11/10/2013   Procedure: ULNAR NERVE TRANSPOSITION;  Surgeon: Wyn Forster, MD;  Location:  SURGERY CENTER;  Service: Orthopedics;  Laterality: Left;   Social History   Occupational History   Occupation: Self employed    Comment: plumber  Tobacco Use   Smoking status: Former    Current packs/day: 0.00    Average packs/day: 1 pack/day for 37.0 years (37.0 ttl pk-yrs)  Types: Cigarettes    Start date: 05/05/1979    Quit date: 05/04/2016    Years since quitting: 6.6   Smokeless tobacco: Never  Vaping Use   Vaping status: Former  Substance and Sexual Activity   Alcohol use: Yes    Alcohol/week: 5.0 standard drinks of alcohol    Types: 5 Glasses of wine per week    Comment: weekends only with dinner   Drug use: No   Sexual activity: Yes     Fara Boros) Denese Killings, M.D. [facility] 9:06 AM

## 2023-01-21 DIAGNOSIS — M47816 Spondylosis without myelopathy or radiculopathy, lumbar region: Secondary | ICD-10-CM | POA: Diagnosis not present

## 2023-01-21 DIAGNOSIS — M4326 Fusion of spine, lumbar region: Secondary | ICD-10-CM | POA: Diagnosis not present

## 2023-01-21 DIAGNOSIS — M5136 Other intervertebral disc degeneration, lumbar region: Secondary | ICD-10-CM | POA: Diagnosis not present

## 2023-01-28 DIAGNOSIS — L570 Actinic keratosis: Secondary | ICD-10-CM | POA: Diagnosis not present

## 2023-02-06 ENCOUNTER — Other Ambulatory Visit (INDEPENDENT_AMBULATORY_CARE_PROVIDER_SITE_OTHER): Payer: Medicare Other

## 2023-02-06 ENCOUNTER — Ambulatory Visit: Payer: Medicare Other | Admitting: Orthopedic Surgery

## 2023-02-06 DIAGNOSIS — M79645 Pain in left finger(s): Secondary | ICD-10-CM | POA: Diagnosis not present

## 2023-02-06 NOTE — Progress Notes (Signed)
Philip Winters - 62 y.o. male MRN [id number]  Date of birth: [birth year]  Office Visit Note: Visit Date: 02/06/2023 PCP: Philip Grandchild, MD Referred by: Philip Grandchild, MD  Subjective: No chief complaint on file.  HPI: Philip Winters is a pleasant 62 y.o. 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  At his prior visit, he underwent injection to the left radiocarpal joint with moderate relief however his symptoms have since returned.  Pertinent ROS were reviewed with the patient and found to be negative unless otherwise specified above in HPI.   Assessment & Plan: Visit Diagnoses:  1. Pain of left thumb      Plan: Extensive discussion was once again had with patient and wife today regarding his left wrist SLAC arthritis.  Bilateral pronated grip views from prior visit were reviewed today, there is notable dynamic widening of the scapholunate interval.  This is associated with his clinical examination, which she does localize pain to the left wrist region with positive clunk and pain with Claudette Laws shift testing.  At this juncture, given his ongoing pain refractory to conservative care, he is indicated for left wrist proximal row carpectomy and possible allograft spacer versus 4 corner fusion of the left wrist.  We discussed risk and benefits of the surgeries at length as well as the postoperative protocol.  Understanding risks and benefits, patient would like to have surgery done in the form of left wrist proximal row carpectomy and placement of allograft spacer.  He is interested mostly in motion preserving surgery.  I did explain that we will confirm appropriate capitate surface intraoperatively as well, based on his radiographic workup, the capitate surface is fairly well preserved and placement of allograft spacer will help maintain spacing at the radiocarpal level.  Given that he is also experiencing  significant pain at the left thumb [facility] articulation secondary to degenerative change, he is indicated for left thumb CMC denervation to be performed in conjunction with his proximal row carpectomy.  Risks and benefits of the procedure were discussed, risks including but not limited to infection, bleeding, scarring, stiffness, nerve injury, tendon injury, vascular injury, hardware complication, recurrence of symptoms and need for subsequent operation.  Patient expressed understanding.  Will move forward with surgical scheduling.    Greater than 45 minutes was spent today in the care of this patient.  Follow-up: No follow-ups on file.   Meds & Orders: No orders of the defined types were placed in this encounter.   Orders Placed This Encounter  Procedures   XR Hand Complete Left     Procedures: No procedures performed      Clinical History: No specialty comments available.  He reports that he quit smoking about 6 years ago. His smoking use included cigarettes. He started smoking about 43 years ago. He has a 37 pack-year smoking history. He has never used smokeless tobacco.  Recent Labs    09/18/22 1031  HGBA1C 5.6    Objective:   Vital Signs: There were no vitals taken for this visit.  Physical Exam  Gen: Well-appearing, in no acute distress; non-toxic CV: Regular Rate. Well-perfused. Warm.  Resp: Breathing unlabored on room air; no wheezing. Psych: Fluid speech in conversation; appropriate affect; normal thought process Neuro: Sensation intact throughout. No gross coordination deficits.   Ortho Exam PHYSICAL EXAM:  General: Patient is well appearing and in no distress. Cervical spine mobility is full  in all directions:  Range of Motion and Palpation Tests: Mobility is full about the elbows with flexion and extension.  Forearm supination and pronation are 85/85 bilaterally.  Wrist flexion/extension is 75/65 right, 55/35 left.  Digital flexion and extension are full.  Thumb  opposition is full to the base of the small fingers bilaterally.    No cords or nodules are palpated.  No triggering is observed.    Significant tenderness over the right thumb CMC articulation is observed.   Watson's shift testing on the left wrist is positive for clunk and pain, negative right  Neurologic, Vascular, Motor: Sensation is intact to light touch in the median/radial/ulnar distributions.  Tinel's testing negative at cubital and carpal tunnel.   Grip strength testing Jamar II right 60, left 60  Fingers pink and well perfused.  Capillary refill is brisk.      Lab Results  Component Value Date   HGBA1C 5.6 09/18/2022     Imaging: XR Hand Complete Left  Result Date: 02/06/2023 X-rays of the left hand, multiple views were obtained today There is evidence of significant radiocarpal degenerative change in the setting of SLAC arthritis.  There is also notable degenerative change at the thumb [facility] articulation.   Past Medical/Family/Surgical/Social History: Medications & Allergies reviewed per EMR, new medications updated. 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) 09/22/2022   Encounter for general adult medical examination with abnormal findings 09/22/2022   Encounter for screening for HIV 09/18/2022   Need for hepatitis C screening test 09/18/2022   Numbness in feet 09/18/2022   Tachycardia 09/18/2022   Dyslipidemia, goal LDL below 70 09/18/2022   Idiopathic small fiber peripheral neuropathy 09/18/2022   Chronic hyperglycemia 09/18/2022   Lumbar spinal stenosis 04/15/2022   Chronic pain of left thumb 02/25/2022   SBE (subacute bacterial endocarditis) prophylaxis candidate 12/16/2021   Cervical radiculopathy 08/13/2021   Ulnar neuropathy 08/13/2021   Screen for colon cancer 06/06/2021   Chronic tachycardia 06/04/2021   Candidal balanitis 06/04/2021   Carpal tunnel syndrome, bilateral upper limbs  09/26/2020   Cubital tunnel syndrome on left 08/23/2020   Pulmonary nodule 1 cm or greater in diameter 04/26/2020   Statin intolerance 04/20/2020   Coronary atherosclerosis of native coronary artery 04/18/2020   Nonrheumatic aortic valve stenosis    Bicuspid aortic valve 04/06/2020   Primary osteoarthritis of left knee 11/08/2019   Bilateral primary osteoarthritis of knee 09/28/2019   HNP (herniated nucleus pulposus), lumbar 09/29/2017   Unilateral primary osteoarthritis, right knee 06/24/2016   Primary hypertension 01/18/2007   Past Medical History:  Diagnosis Date   Allergy    Arthritis    CAD (coronary artery disease)    GERD (gastroesophageal reflux disease)    occ   Heart murmur    baby   High cholesterol    Hypertension    Peripheral neuropathy    Pneumonia    Right carotid bruit 06/10/2017   Sciatica    Seasonal allergies    Sinus tachycardia    Spinal stenosis    Family History  Problem Relation Age of Onset   Hypertension Mother    Throat cancer Mother    Hypertension Father    Cancer Father    Colon cancer Neg Hx    Esophageal cancer Neg Hx    Stomach cancer Neg Hx    Rectal cancer Neg Hx    Colon polyps Neg Hx    Past  Surgical History:  Procedure Laterality Date   CARDIAC CATHETERIZATION     CARPAL TUNNEL WITH CUBITAL TUNNEL Left 11/10/2013   Procedure: LEFT CARPAL TUNNEL RELEASE;  Surgeon: Wyn Forster, MD;  Location: [facility];  Service: Orthopedics;  Laterality: Left;   COLONOSCOPY  2023   CORONARY ARTERY BYPASS GRAFT N/A 05/01/2020   Procedure: CORONARY ARTERY BYPASS GRAFTING (CABG) X 2 ON CARDIOPULMONARY BYPASS. LIMA TO LAD, SVG TO DIAG;  Surgeon: Kerin Perna, MD;  Location: [facility] OR;  Service: Open Heart Surgery;  Laterality: N/A;   ENDOVEIN HARVEST OF GREATER SAPHENOUS VEIN Right 05/01/2020   Procedure: ENDOVEIN HARVEST OF GREATER SAPHENOUS VEIN;  Surgeon: Kerin Perna, MD;  Location: [facility] OR;  Service: Open Heart  Surgery;  Laterality: Right;   KNEE ARTHROSCOPY  [id number],[id number]   left and right   LUMBAR LAMINECTOMY/DECOMPRESSION MICRODISCECTOMY Left 09/29/2017   Procedure: LEFT LUMBAR TWO - LUMBAR THREELAMINOTOMY/MICRODISCECTOMY;  Surgeon: Shirlean Kelly, MD;  Location: [facility] OR;  Service: Neurosurgery;  Laterality: Left;  LEFT LUMBAR 2- LUMBAR 3 LAMINOTOMY/MICRODISCECTOMY   NASAL SINUS SURGERY  05/2015   RIGHT/LEFT HEART CATH AND CORONARY ANGIOGRAPHY N/A 04/10/2020   Procedure: RIGHT/LEFT HEART CATH AND CORONARY ANGIOGRAPHY;  Surgeon: Lennette Bihari, MD;  Location: [facility] IN[facility]ASIVE [facility];  Service: Cardiovascular;  Laterality: N/A;   TEE WITHOUT CARDIOVERSION N/A 05/01/2020   Procedure: TRANSESOPHAGEAL ECHOCARDIOGRAM (TEE);  Surgeon: Donata Clay, Theron Arista, MD;  Location: [facility] OR;  Service: Open Heart Surgery;  Laterality: N/A;   TOTAL KNEE ARTHROPLASTY Right 06/24/2016   Procedure: TOTAL KNEE ARTHROPLASTY;  Surgeon: Valeria Batman, MD;  Location: [facility] OR;  Service: Orthopedics;  Laterality: Right;   TOTAL KNEE ARTHROPLASTY Left 12/18/2020   Procedure: LEFT TOTAL KNEE ARTHROPLASTY;  Surgeon: Valeria Batman, MD;  Location: [facility] ORS;  Service: Orthopedics;  Laterality: Left;   TRANSFORAMINAL LUMBAR INTERBODY FUSION W/ MIS 2 LEVEL Left 04/15/2022   Procedure: Minimally Invasive Surgery Decompression and Transforaminal Lumbar Interbody Fusion , Left , Lumbar three-four, Lumbar four-five;  Surgeon: Dawley, Alan Mulder, DO;  Location: [facility] OR;  Service: Neurosurgery;  Laterality: Left;   TRIGGER FINGER RELEASE Left 11/10/2013   Procedure: LEFT INDEX A-1 PULLEY RELEASE;  Surgeon: Wyn Forster, MD;  Location: [facility];  Service: Orthopedics;  Laterality: Left;   ULNAR NERVE TRANSPOSITION Left 11/10/2013   Procedure: ULNAR NERVE TRANSPOSITION;  Surgeon: Wyn Forster, MD;  Location: [facility];  Service: Orthopedics;  Laterality: Left;   Social History   Occupational History   Occupation:  Self employed    Comment: plumber  Tobacco Use   Smoking status: Former    Current packs/day: 0.00    Average packs/day: 1 pack/day for 37.0 years (37.0 ttl pk-yrs)    Types: Cigarettes    Start date: 05/05/1979    Quit date: 05/04/2016    Years since quitting: 6.7   Smokeless tobacco: Never  Vaping Use   Vaping status: Former  Substance and Sexual Activity   Alcohol use: Yes    Alcohol/week: 5.0 standard drinks of alcohol    Types: 5 Glasses of wine per week    Comment: weekends only with dinner   Drug use: No   Sexual activity: Yes    Tonianne Fine Fara Boros) Denese Killings, M.D. [facility] 9:50 AM

## 2023-02-11 ENCOUNTER — Encounter: Payer: Self-pay | Admitting: Orthopedic Surgery

## 2023-02-13 NOTE — Telephone Encounter (Signed)
I spoke with patient on 02/12/23, and scheduled him for surgery on 03/13/23.

## 2023-03-05 ENCOUNTER — Encounter (HOSPITAL_BASED_OUTPATIENT_CLINIC_OR_DEPARTMENT_OTHER): Payer: Self-pay | Admitting: Orthopedic Surgery

## 2023-03-05 ENCOUNTER — Encounter: Payer: Self-pay | Admitting: Orthopedic Surgery

## 2023-03-05 NOTE — Progress Notes (Addendum)
Reviewed cardiac history, echo and notes with Dr. Ace Gins at [facility]. Okay to proceed at [facility] without further testing or clearance.  Spoke with April at Dr. Birdena Jubilee office about patient stopping ASA. She will get stop time info and get back to [name].

## 2023-03-12 NOTE — Anesthesia Preprocedure Evaluation (Addendum)
Anesthesia Evaluation  Patient identified by MRN, date of birth, ID band Patient awake    Reviewed: Allergy & Precautions, NPO status , Patient's Chart, lab work & pertinent test results  History of Anesthesia Complications Negative for: history of anesthetic complications  Airway Mallampati: II  TM Distance: >3 FB Neck ROM: Full    Dental  (+) Dental Advisory Given, Teeth Intact   Pulmonary former smoker   Pulmonary exam normal        Cardiovascular hypertension, Pt. on medications + CAD and + CABG  + Valvular Problems/Murmurs (bicuspid AV)  Rhythm:Regular + Systolic murmurs Echo 06/2022  1. Aortic stenosis is now moderate. Vmax 3.4 m/s, MG 26 mmMG, EOA 1.06 cm2, DI 0.26. The aortic valve is tricuspid. There is moderate calcification of the aortic valve. There is moderate thickening of the aortic valve. Aortic valve regurgitation is mild. Moderate aortic valve stenosis. Aortic valve area, by VTI measures 1.06 cm. Aortic valve mean gradient measures 26.0 mmHg. Aortic valve Vmax measures 3.44 m/s.   2. Left ventricular ejection fraction, by estimation, is 60 to 65%. The left ventricle has normal function. The left ventricle has no regional wall motion abnormalities. There is mild concentric left ventricular hypertrophy. Left ventricular diastolic parameters are consistent with Grade I diastolic dysfunction (impaired relaxation).   3. Right ventricular systolic function is normal. The right ventricular size is normal. Tricuspid regurgitation signal is inadequate for assessing PA pressure.   4. The mitral valve is grossly normal. Trivial mitral valve regurgitation. No evidence of mitral stenosis.   5. The inferior vena cava is normal in size with greater than 50% respiratory variability, suggesting right atrial pressure of 3 mmHg.   Comparison(s): Changes from prior study are noted.       Neuro/Psych  Neuromuscular disease  negative  psych ROS   GI/Hepatic Neg liver ROS,GERD  Controlled,,  Endo/Other  negative endocrine ROS    Renal/GU negative Renal ROS     Musculoskeletal  (+) Arthritis ,    Abdominal   Peds  Hematology negative hematology ROS (+)   Anesthesia Other Findings   Reproductive/Obstetrics                              Anesthesia Physical Anesthesia Plan  ASA: 3  Anesthesia Plan: Regional   Post-op Pain Management: Tylenol PO (pre-op)* and Regional block*   Induction: Intravenous  PONV Risk Score and Plan: 2 and Treatment may vary due to age or medical condition, Ondansetron, Midazolam, Propofol infusion and TIVA  Airway Management Planned: Natural Airway  Additional Equipment: None  Intra-op Plan:   Post-operative Plan:   Informed Consent: I have reviewed the patients History and Physical, chart, labs and discussed the procedure including the risks, benefits and alternatives for the proposed anesthesia with the patient or authorized representative who has indicated his/her understanding and acceptance.     Dental advisory given  Plan Discussed with: CRNA  Anesthesia Plan Comments:          Anesthesia Quick Evaluation

## 2023-03-12 NOTE — Plan of Care (Signed)
CH[facility] Tonsillectomy/Adenoidectomy, Postoperative PEDS care plan entered in error.

## 2023-03-13 ENCOUNTER — Ambulatory Visit (HOSPITAL_BASED_OUTPATIENT_CLINIC_OR_DEPARTMENT_OTHER): Payer: Medicare Other

## 2023-03-13 ENCOUNTER — Encounter (HOSPITAL_BASED_OUTPATIENT_CLINIC_OR_DEPARTMENT_OTHER)
Admission: RE | Disposition: A | Discharge status: Home / Self Care | Payer: Self-pay | Source: Home / Self Care | Attending: Orthopedic Surgery

## 2023-03-13 ENCOUNTER — Encounter (HOSPITAL_BASED_OUTPATIENT_CLINIC_OR_DEPARTMENT_OTHER): Payer: Self-pay | Admitting: Orthopedic Surgery

## 2023-03-13 ENCOUNTER — Other Ambulatory Visit: Payer: Self-pay

## 2023-03-13 ENCOUNTER — Ambulatory Visit (HOSPITAL_BASED_OUTPATIENT_CLINIC_OR_DEPARTMENT_OTHER)
Admission: RE | Admit: 2023-03-13 | Discharge: 2023-03-13 | Disposition: A | Discharge status: Home / Self Care | Payer: Medicare Other | Attending: Orthopedic Surgery | Admitting: Orthopedic Surgery

## 2023-03-13 ENCOUNTER — Ambulatory Visit (HOSPITAL_BASED_OUTPATIENT_CLINIC_OR_DEPARTMENT_OTHER): Payer: Medicare Other | Admitting: Anesthesiology

## 2023-03-13 DIAGNOSIS — K219 Gastro-esophageal reflux disease without esophagitis: Secondary | ICD-10-CM | POA: Insufficient documentation

## 2023-03-13 DIAGNOSIS — I2510 Atherosclerotic heart disease of native coronary artery without angina pectoris: Secondary | ICD-10-CM | POA: Diagnosis not present

## 2023-03-13 DIAGNOSIS — M19042 Primary osteoarthritis, left hand: Secondary | ICD-10-CM | POA: Diagnosis not present

## 2023-03-13 DIAGNOSIS — Z01818 Encounter for other preprocedural examination: Secondary | ICD-10-CM

## 2023-03-13 DIAGNOSIS — Z87891 Personal history of nicotine dependence: Secondary | ICD-10-CM | POA: Insufficient documentation

## 2023-03-13 DIAGNOSIS — M1812 Unilateral primary osteoarthritis of first carpometacarpal joint, left hand: Secondary | ICD-10-CM

## 2023-03-13 DIAGNOSIS — I10 Essential (primary) hypertension: Secondary | ICD-10-CM | POA: Insufficient documentation

## 2023-03-13 DIAGNOSIS — I350 Nonrheumatic aortic (valve) stenosis: Secondary | ICD-10-CM | POA: Diagnosis not present

## 2023-03-13 DIAGNOSIS — M19132 Post-traumatic osteoarthritis, left wrist: Secondary | ICD-10-CM | POA: Diagnosis not present

## 2023-03-13 DIAGNOSIS — M19032 Primary osteoarthritis, left wrist: Secondary | ICD-10-CM | POA: Insufficient documentation

## 2023-03-13 DIAGNOSIS — Z0001 Encounter for general adult medical examination with abnormal findings: Secondary | ICD-10-CM

## 2023-03-13 DIAGNOSIS — M25532 Pain in left wrist: Secondary | ICD-10-CM | POA: Diagnosis present

## 2023-03-13 DIAGNOSIS — Z951 Presence of aortocoronary bypass graft: Secondary | ICD-10-CM | POA: Diagnosis not present

## 2023-03-13 DIAGNOSIS — G8918 Other acute postprocedural pain: Secondary | ICD-10-CM | POA: Diagnosis not present

## 2023-03-13 HISTORY — PX: CARPECTOMY: SHX5004

## 2023-03-13 SURGERY — CARPECTOMY
Anesthesia: Regional | Site: Wrist | Laterality: Left

## 2023-03-13 MED ORDER — CLONIDINE HCL (ANALGESIA) 100 MCG/ML EP SOLN
EPIDURAL | Status: DC | PRN
  Administered 2023-03-13: 80 ug

## 2023-03-13 MED ORDER — VANCOMYCIN HCL IN DEXTROSE 1-5 GM/200ML-% IV SOLN
1000.0000 mg | Freq: Once | INTRAVENOUS | Status: AC
  Administered 2023-03-13: 1000 mg via INTRAVENOUS

## 2023-03-13 MED ORDER — OXYCODONE HCL 5 MG PO TABS: 5.0000 mg | ORAL_TABLET | ORAL | 0 refills | Status: DC | PRN

## 2023-03-13 MED ORDER — ONDANSETRON HCL 4 MG/2ML IJ SOLN
INTRAMUSCULAR | Status: AC
  Filled 2023-03-13: qty 2

## 2023-03-13 MED ORDER — DEXMEDETOMIDINE HCL IN NACL 80 MCG/20ML IV SOLN
INTRAVENOUS | Status: DC | PRN
  Administered 2023-03-13: 8 ug via INTRAVENOUS

## 2023-03-13 MED ORDER — FENTANYL CITRATE (PF) 100 MCG/2ML IJ SOLN
100.0000 ug | Freq: Once | INTRAMUSCULAR | Status: AC
  Administered 2023-03-13: 100 ug via INTRAVENOUS

## 2023-03-13 MED ORDER — ACETAMINOPHEN 500 MG PO TABS
ORAL_TABLET | ORAL | Status: AC
Start: 2023-03-13 — End: ?
  Filled 2023-03-13: qty 2

## 2023-03-13 MED ORDER — MIDAZOLAM HCL 5 MG/5ML IJ SOLN
INTRAMUSCULAR | Status: DC | PRN
  Administered 2023-03-13: 2 mg via INTRAVENOUS

## 2023-03-13 MED ORDER — DROPERIDOL 2.5 MG/ML IJ SOLN: 0.6250 mg | Freq: Once | INTRAMUSCULAR | Status: DC | PRN

## 2023-03-13 MED ORDER — DEXAMETHASONE SODIUM PHOSPHATE 4 MG/ML IJ SOLN
INTRAMUSCULAR | Status: DC | PRN
  Administered 2023-03-13: 6 mg via PERINEURAL

## 2023-03-13 MED ORDER — FENTANYL CITRATE (PF) 100 MCG/2ML IJ SOLN: 25.0000 ug | INTRAMUSCULAR | Status: DC | PRN

## 2023-03-13 MED ORDER — PROPOFOL 500 MG/50ML IV EMUL
INTRAVENOUS | Status: DC | PRN
  Administered 2023-03-13 (×2): 100 ug/kg/min via INTRAVENOUS

## 2023-03-13 MED ORDER — MIDAZOLAM HCL 2 MG/2ML IJ SOLN
INTRAMUSCULAR | Status: AC
  Filled 2023-03-13: qty 2

## 2023-03-13 MED ORDER — ROPIVACAINE HCL 5 MG/ML IJ SOLN
INTRAMUSCULAR | Status: DC | PRN
  Administered 2023-03-13: 40 mL via PERINEURAL

## 2023-03-13 MED ORDER — 0.9 % SODIUM CHLORIDE (POUR BTL) OPTIME
TOPICAL | Status: DC | PRN
  Administered 2023-03-13: 2000 mL

## 2023-03-13 MED ORDER — LACTATED RINGERS IV SOLN: INTRAVENOUS | Status: DC

## 2023-03-13 MED ORDER — VANCOMYCIN HCL IN DEXTROSE 1-5 GM/200ML-% IV SOLN
INTRAVENOUS | Status: AC
  Filled 2023-03-13: qty 200

## 2023-03-13 MED ORDER — ONDANSETRON HCL 4 MG/2ML IJ SOLN
INTRAMUSCULAR | Status: DC | PRN
  Administered 2023-03-13: 4 mg via INTRAVENOUS

## 2023-03-13 MED ORDER — PHENYLEPHRINE HCL (PRESSORS) 10 MG/ML IV SOLN
INTRAVENOUS | Status: DC | PRN
  Administered 2023-03-13 (×7): 80 ug via INTRAVENOUS

## 2023-03-13 MED ORDER — PROPOFOL 10 MG/ML IV BOLUS
INTRAVENOUS | Status: DC | PRN
  Administered 2023-03-13: 20 mg via INTRAVENOUS
  Administered 2023-03-13: 30 mg via INTRAVENOUS
  Administered 2023-03-13: 10 mg via INTRAVENOUS

## 2023-03-13 MED ORDER — FENTANYL CITRATE (PF) 100 MCG/2ML IJ SOLN
INTRAMUSCULAR | Status: AC
  Filled 2023-03-13: qty 2

## 2023-03-13 MED ORDER — ACETAMINOPHEN 500 MG PO TABS
1000.0000 mg | ORAL_TABLET | Freq: Once | ORAL | Status: AC
  Administered 2023-03-13: 1000 mg via ORAL

## 2023-03-13 SURGICAL SUPPLY — 67 items
ADH SKN CLS APL DERMABOND .7 (GAUZE/BANDAGES/DRESSINGS)
ANCHOR REPAIR HAND WRIST (Anchor) IMPLANT
APL PRP STRL LF DISP 70% ISPRP (MISCELLANEOUS) ×1
BLADE MINI RND TIP GREEN BEAV (BLADE) ×1 IMPLANT
BLADE SURG 15 STRL LF DISP TIS (BLADE) ×2 IMPLANT
BLADE SURG 15 STRL SS (BLADE) ×2
BNDG CMPR 5X4 CHSV STRCH STRL (GAUZE/BANDAGES/DRESSINGS) ×2
BNDG CMPR 5X4 KNIT ELC UNQ LF (GAUZE/BANDAGES/DRESSINGS) ×2
BNDG CMPR 75X21 PLY HI ABS (MISCELLANEOUS) ×1
BNDG CMPR 9X4 STRL LF SNTH (GAUZE/BANDAGES/DRESSINGS) ×1
BNDG COHESIVE 4X5 TAN STRL LF (GAUZE/BANDAGES/DRESSINGS) ×2 IMPLANT
BNDG ELASTIC 4INX 5YD STR LF (GAUZE/BANDAGES/DRESSINGS) ×2 IMPLANT
BNDG ESMARK 4X9 LF (GAUZE/BANDAGES/DRESSINGS) ×1 IMPLANT
BNDG GAUZE DERMACEA FLUFF 4 (GAUZE/BANDAGES/DRESSINGS) ×1 IMPLANT
BNDG GZE DERMACEA 4 6PLY (GAUZE/BANDAGES/DRESSINGS) ×1
CHLORAPREP W/TINT 26 (MISCELLANEOUS) ×1 IMPLANT
CORD BIPOLAR FORCEPS 12FT (ELECTRODE) ×1 IMPLANT
COVER BACK TABLE 60X90IN (DRAPES) ×1 IMPLANT
CUFF TOURN SGL QUICK 18X4 (TOURNIQUET CUFF) ×1 IMPLANT
DERMABOND ADVANCED .7 DNX12 (GAUZE/BANDAGES/DRESSINGS) IMPLANT
DRAPE EXTREMITY T 121X128X90 (DISPOSABLE) ×1 IMPLANT
DRAPE OEC MINIVIEW 54X84 (DRAPES) ×1 IMPLANT
DRAPE SURG 17X23 STRL (DRAPES) ×1 IMPLANT
GAUZE 4X4 16PLY ~~LOC~~+RFID DBL (SPONGE) IMPLANT
GAUZE SPONGE 4X4 12PLY STRL (GAUZE/BANDAGES/DRESSINGS) ×1 IMPLANT
GAUZE STRETCH 2X75IN STRL (MISCELLANEOUS) ×1 IMPLANT
GAUZE XEROFORM 1X8 LF (GAUZE/BANDAGES/DRESSINGS) ×1 IMPLANT
GLOVE BIO SURGEON STRL SZ7.5 (GLOVE) ×2 IMPLANT
GLOVE BIOGEL PI IND STRL 7.5 (GLOVE) ×1 IMPLANT
GOWN STRL REUS W/ TWL LRG LVL3 (GOWN DISPOSABLE) ×1 IMPLANT
GOWN STRL REUS W/TWL LRG LVL3 (GOWN DISPOSABLE) ×1
GOWN STRL REUS W/TWL XL LVL3 (GOWN DISPOSABLE) IMPLANT
GOWN STRL SURGICAL XL XLNG (GOWN DISPOSABLE) ×1 IMPLANT
GRAFT TISS 40X70 3 THK DERM (Tissue) IMPLANT
K-WIRE DBL .062X4 NSTRL (WIRE) ×2
KWIRE DBL .062X4 NSTRL (WIRE) ×1 IMPLANT
MANIFOLD NEPTUNE II (INSTRUMENTS) ×1 IMPLANT
NDL 1/2 CIR CATGUT .05X1.09 (NEEDLE) IMPLANT
NDL HYPO 25X1 1.5 SAFETY (NEEDLE) IMPLANT
NDL HYPO 25X5/8 SAFETYGLIDE (NEEDLE) IMPLANT
NEEDLE 1/2 CIR CATGUT .05X1.09 (NEEDLE) ×1 IMPLANT
NEEDLE HYPO 25X1 1.5 SAFETY (NEEDLE) IMPLANT
NEEDLE HYPO 25X5/8 SAFETYGLIDE (NEEDLE) IMPLANT
NS IRRIG 1000ML POUR BTL (IV SOLUTION) ×1 IMPLANT
PACK BASIN DAY SURGERY FS (CUSTOM PROCEDURE TRAY) ×1 IMPLANT
SHEET MEDIUM DRAPE 40X70 STRL (DRAPES) ×1 IMPLANT
SLEEVE SCD COMPRESS KNEE MED (STOCKING) ×1 IMPLANT
SLING ARM FOAM STRAP LRG (SOFTGOODS) IMPLANT
SPIKE FLUID TRANSFER (MISCELLANEOUS) IMPLANT
SPLINT PLASTER CAST XFAST 4X15 (CAST SUPPLIES) ×1 IMPLANT
STOCKINETTE IMPERVIOUS 9X36 MD (GAUZE/BANDAGES/DRESSINGS) ×1 IMPLANT
SUCTION TUBE FRAZIER 10FR DISP (SUCTIONS) ×1 IMPLANT
SUT 0 FIBERLOOP 38 BLUE TPR ND (SUTURE) ×2
SUT ETHILON 4 0 PS 2 18 (SUTURE) ×1 IMPLANT
SUT FIBERWIRE 2-0 18 17.9 3/8 (SUTURE) ×1
SUT MNCRL AB 3-0 PS2 27 (SUTURE) ×1 IMPLANT
SUT MNCRL AB 4-0 PS2 18 (SUTURE) ×1 IMPLANT
SUT VIC AB 3-0 PS2 18 (SUTURE) ×2
SUT VIC AB 3-0 PS2 18XBRD (SUTURE) ×1 IMPLANT
SUTURE 0 FIBERLP 38 BLU TPR ND (SUTURE) IMPLANT
SUTURE FIBERWR 2-0 18 17.9 3/8 (SUTURE) IMPLANT
SYR BULB EAR ULCER 3OZ GRN STR (SYRINGE) ×2 IMPLANT
SYR CONTROL 10ML LL (SYRINGE) IMPLANT
TISSUE ARTHOFLEX THICK 3MM (Tissue) ×1 IMPLANT
TOWEL GREEN STERILE FF (TOWEL DISPOSABLE) ×2 IMPLANT
TUBE CONNECTING 20X1/4 (TUBING) ×1 IMPLANT
UNDERPAD 30X36 HEAVY ABSORB (UNDERPADS AND DIAPERS) ×1 IMPLANT

## 2023-03-13 NOTE — Anesthesia Procedure Notes (Signed)
Anesthesia Regional Block: Axillary brachial plexus block   Pre-Anesthetic Checklist: , timeout performed,  Correct Patient, Correct Site, Correct Laterality,  Correct Procedure, Correct Position, site marked,  Risks and benefits discussed,  Surgical consent,  Pre-op evaluation,  At surgeon's request and post-op pain management  Laterality: Upper and Left  Prep: chloraprep       Needles:  Injection technique: Single-shot  Needle Type: Stimiplex          Additional Needles:   Procedures:,,,, ultrasound used (permanent image in chart),,    Narrative:  Start time: 03/13/2023 7:02 AM End time: 03/13/2023 7:22 AM Injection made incrementally with aspirations every 5 mL.  Performed by: Personally  Anesthesiologist: Lewie Loron, MD  Additional Notes: BP cuff, SpO2 and EKG monitors applied. Sedation begun. Nerve location verified with ultrasound. Anesthetic injected incrementally, slowly, and after neg aspirations under direct u/s guidance. Good perineural spread. Tolerated well.

## 2023-03-13 NOTE — Progress Notes (Signed)
Assisted Dr. Germeroth with left, axillary, ultrasound guided block. Side rails up, monitors on throughout procedure. See vital signs in flow sheet. Tolerated Procedure well. 

## 2023-03-13 NOTE — Discharge Instructions (Addendum)
Hand Surgery Postop Instructions   Dressings: Maintain postoperative dressing until orthopedic follow-up.  Keep operative site clean and dry until orthopedic follow-up.  Wound Care: Keep your hand elevated above the level of your heart.  Do not allow it to dangle by your side. Moving your fingers is advised to stimulate circulation but will depend on the site of your surgery.  If you have a splint applied, your doctor will advise you regarding movement.  Activity: Do not drive or operate machinery until clearance given from physician. No heavy lifting with operative extremity.  Diet:  Drink liquids today or eat a light diet.  You may resume a regular diet tomorrow.    General expectations: Take prescribed medication if given, transition to over-the-counter medication as quickly as possible. Fingers may become slightly swollen.  Call your doctor if any of the following occur: Severe pain not relieved by pain medication. Elevated temperature. Dressing soaked with blood. Inability to move fingers. White or bluish color to fingers.   Anshul Trevor Mace, M.D. Hand Surgery [facility]    Post Anesthesia Home Care Instructions  Activity: Get plenty of rest for the remainder of the day. A responsible individual must stay with you for 24 hours following the procedure.  For the next 24 hours, DO NOT: -Drive a car -Advertising copywriter -Drink alcoholic beverages -Take any medication unless instructed by your physician -Make any legal decisions or sign important papers.  Meals: Start with liquid foods such as gelatin or soup. Progress to regular foods as tolerated. Avoid greasy, spicy, heavy foods. If nausea and/or vomiting occur, drink only clear liquids until the nausea and/or vomiting subsides. Call your physician if vomiting continues.  Special Instructions/Symptoms: Your throat may feel dry or sore from the anesthesia or the breathing tube placed in your  throat during surgery. If this causes discomfort, gargle with warm salt water. The discomfort should disappear within 24 hours.  If you had a scopolamine patch placed behind your ear for the management of post- operative nausea and/or vomiting:  1. The medication in the patch is effective for 72 hours, after which it should be removed.  Wrap patch in a tissue and discard in the trash. Wash hands thoroughly with soap and water. 2. You may remove the patch earlier than 72 hours if you experience unpleasant side effects which may include dry mouth, dizziness or visual disturbances. 3. Avoid touching the patch. Wash your hands with soap and water after contact with the patch.     Regional Anesthesia Blocks  1. You may not be able to move or feel the "blocked" extremity after a regional anesthetic block. This may last may last from 3-48 hours after placement, but it will go away. The length of time depends on the medication injected and your individual response to the medication. As the nerves start to wake up, you may experience tingling as the movement and feeling returns to your extremity. If the numbness and inability to move your extremity has not gone away after 48 hours, please call your surgeon.   2. The extremity that is blocked will need to be protected until the numbness is gone and the strength has returned. Because you cannot feel it, you will need to take extra care to avoid injury. Because it may be weak, you may have difficulty moving it or using it. You may not know what position it is in without looking at it while the block is in effect.  3.  For blocks in the legs and feet, returning to weight bearing and walking needs to be done carefully. You will need to wait until the numbness is entirely gone and the strength has returned. You should be able to move your leg and foot normally before you try and bear weight or walk. You will need someone to be with you when you first try to ensure  you do not fall and possibly risk injury.  4. Bruising and tenderness at the needle site are common side effects and will resolve in a few days.  5. Persistent numbness or new problems with movement should be communicated to the surgeon or the [facility] [phone] [facility] [phone]).  May have Tylenol today after 12:30 PM

## 2023-03-13 NOTE — Op Note (Signed)
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   ASSISTANT: None   ANESTHESIA:  Regional with sedation   INTRAVENOUS FLUIDS:  Per anesthesia flow sheet.   ESTIMATED BLOOD LOSS:  Minimal.   COMPLICATIONS:  None.   SPECIMENS:  none   TOURNIQUET TIME:    Total Tourniquet Time Documented: Upper Arm (Left) - 98 minutes Total: Upper Arm (Left) - 98 minutes    DISPOSITION:  Stable to PACU.   INDICATIONS: This is a 62 year old male with ongoing left wrist SLAC arthritis as well as thumb CMC arthritis.  Extensive discussion was had with patient and wife regarding his left wrist SLAC arthritis.  At this juncture, given his ongoing pain refractory to conservative care, he is indicated for left wrist proximal row carpectomy and possible allograft spacer versus 4 corner fusion of the left wrist.  We discussed risk and benefits of the surgeries at length as well as the postoperative protocol.   Understanding risks and benefits, patient would like to have surgery done in the form of left wrist proximal row carpectomy and placement of allograft spacer.  He is interested mostly in motion preserving surgery.  I did explain that we will confirm appropriate capitate surface intraoperatively as well, based on his radiographic workup, the capitate surface is fairly well preserved and placement of allograft spacer will help maintain spacing at the radiocarpal level.   Given that he  is also experiencing significant pain at the left thumb [facility] articulation secondary to degenerative change, he is indicated for left thumb CMC denervation to be performed in conjunction with his proximal row carpectomy.   Risks and benefits of the procedure were discussed, risks including but not limited to infection, bleeding, scarring, stiffness, nerve injury, tendon injury, vascular injury, hardware complication, recurrence of symptoms and need for subsequent operation.  Patient expressed understanding.  Will move forward with surgery as planned.   OPERATIVE COURSE: Patient was seen and identified in the preoperative area and marked appropriately.  Surgical consent had been signed. Preoperative IV antibiotic prophylaxis was given. He was transferred to the operating room and placed in supine position with the Left upper extremity on an arm board.  Sedation was induced by the anesthesiologist. A regional block had been performed by anesthesia in preoperative holding.    Left upper extremity was prepped and draped in normal sterile orthopedic fashion.  A surgical pause was performed between the surgeons, anesthesia, and operating room staff and all were in agreement as to the patient, procedure, and site of procedure.  Tourniquet was placed and padded appropriately to the left upper arm.  The arm was exsanguinated the tourniquet was inflated to 250 mmHg.  A longitudinal incision was designed over the dorsal aspect of the left wrist just ulnar to Lister's tubercle.  Blunt dissection was performed down, exposure was performed between the second and fourth extensor compartments. Retinaculum was incised to allow for transposition of the EPL tendon. Within the fourth extensor compartment, we identified the posterior interosseous nerve,  which was cauterized, 1 cm was transected as well for pain control purposes.  Once PIN neurectomy was completed, attention was returned to the wrist.  Wrist capsule was incised in  longitudinal fashion, T-shaped incision was performed at the level of the distal radius to create appropriate capsular flaps.  Proximal row of the carpus was identified and confirmed with fluoroscopy.  Utilizing K wire as a joystick, and with use of the McGlamery instrument, lunate was first excised followed by excision of both the triquetrum and the scaphoid, care was taken to avoid injury to the radial scaphoid capitate ligament.  Appropriate proximal row carpectomy was then confirmed with fluoroscopy.  Copious irrigation was performed of the wrist joint.  Capitate surface was then inspected, there was slight chondral wear notable at the capitate surface, decision was made to proceed with the allograft spacer as planned.  The allograft spacer was then selected on the back table, we utilized the 3.0 mm thickness Arthroflex material.  This was folded over to create a 6.0 mm thickness spacer, which was then temporarily inserted into the wrist joint to be sized appropriately.  Spacer was then cut down to the appropriate length, 0 FiberWire was utilized to suture the folded over spacer together to prevent shear stress and maintain thickness.  0 FiberWire was then utilized to secure the spacer to the volar capsular tissue of the wrist.  This was followed by placement of a 3.5 mm swivel lock in the nonarticular portion of the capitate to create a box like configuration for security of the allograft spacer.  2-0 FiberWire was seated within the swivel lock, this was then secured to the dorsal aspect of the spacer within the wrist joint.  Gentle range of motion of the wrist with flexion extension was performed to confirm appropriate security and stability of the spacer.  No extrusion of the spacer was noted with flexion and extension of the wrist.  After copious irrigation, capsular closure was then performed utilizing 2-0 Vicryl in figure-of-eight fashion.  This followed by retinacular closure utilizing 2-0 Vicryl  as well, leaving EPL transposed.  Layered closure was then performed utilizing 3-0 Vicryl for the subcutaneous layer and 4-0 nylon for the skin surface over the wrist.  Attention was then turned to the thumb CMC joint.  A straight line incision was made over the dorsum of the left thumb CMC joint at the glabrous/nonglabrous junction.  Crossing branches of the radial sensory nerve were identified and carefully protected.  The radial artery was identified and protected.  The first extensor compartment was first released.  The tendons of the first extensor compartment were identified within the incisional site.  The dorsal most aspect of the tendon sheath was then released and carried down over the radial styloid.  Following release of the first extensor compartment, we proceeded with thumb CMC denervation procedure.  We first began with the dorsal sensory branches of the radial nerve to the [facility] joint which were identified and cauterized utilizing bipolar electrocautery.  We then elevated the thenar musculature slightly to better identify the thenar branch of the median nerve with innervation to the [facility] joint.  Once again, this was cauterized.  We then elevated the EPB tendon, swept over and around the thumb CMC capsule in order to cauterize the palmar cutaneous branches of the median nerve.  Finally, lateral antebrachial cutaneous nerve was identified near the radial artery, with a specific branch of the [facility] which was cauterized with bipolar electrocautery.  Care was  taken to avoid injury to the radial artery and the dorsal branch.  Capsular tissue around the [facility] joint of the thumb was also cauterized excessively for denervation purposes.  Copious irrigation was then performed of this incision.  Layered closure was performed utilizing 3-0 Vicryl for the subcutaneous layer and 4-0 nylon for the skin surface in standard fashion.  The tourniquet was deflated at 98 minutes.  Fingertips were pink with brisk capillary  refill after deflation of tourniquet.  Copious irrigation had been performed.  Sterile dressings were provided followed by application of a volar and dorsal wrist splint utilizing plaster, extra padding was placed over the thumb CMC as well.  The operative drapes were broken down.  The patient was awoken from anesthesia safely and taken to PACU in stable condition.  I will see him back in the office in 2 weeks for postoperative followup.     Samuella Cota, MD Electronically signed, 03/13/23

## 2023-03-13 NOTE — Transfer of Care (Signed)
Immediate Anesthesia Transfer of Care Note  Patient: Philip Winters  Procedure(s) Performed: LEFT WRIST PROXIMAL ROW CARPECTOMY WITH PLACEMENT ALLOGRAFT SPACER / THUMB CMC DENERVATION (Left: Wrist)  Patient Location: PACU  Anesthesia Type:MAC combined with regional for post-op pain  Level of Consciousness: awake, alert , and oriented  Airway & Oxygen Therapy: Patient Spontanous Breathing and Patient connected to face mask oxygen  Post-op Assessment: Report given to RN and Post -op Vital signs reviewed and stable  Post vital signs: Reviewed and stable  Last Vitals:  Vitals Value Taken Time  BP 95/71 03/13/23 1009  Temp    Pulse 87 03/13/23 1012  Resp 14 03/13/23 1012  SpO2 96 % 03/13/23 1012  Vitals shown include unfiled device data.  Last Pain:  Vitals:   03/13/23 0624  TempSrc: Temporal  PainSc: 6       Patients Stated Pain Goal: 7 (03/13/23 1610)  Complications: No notable events documented.

## 2023-03-13 NOTE — H&P (Signed)
ORTHO H&P UPDATE  Philip Winters is a pleasant 62 y.o. male with left wrist SLAC arthritis with associated left thumb CMC arthritis.  At his prior visit, he underwent injection to the left radiocarpal joint with moderate relief however his symptoms have since returned.   Pertinent ROS were reviewed with the patient and found to be negative unless otherwise specified above in HPI.    Assessment & Plan: Visit Diagnoses:  1. Pain of left wrist and thumb       Plan: Extensive discussion was once again had with patient and wife today regarding his left wrist SLAC arthritis.  At this juncture, given his ongoing pain refractory to conservative care, he is indicated for left wrist proximal row carpectomy and possible allograft spacer versus 4 corner fusion of the left wrist.  We discussed risk and benefits of the surgeries at length as well as the postoperative protocol.   Understanding risks and benefits, patient would like to have surgery done in the form of left wrist proximal row carpectomy and placement of allograft spacer.  He is interested mostly in motion preserving surgery.  I did explain that we will confirm appropriate capitate surface intraoperatively as well, based on his radiographic workup, the capitate surface is fairly well preserved and placement of allograft spacer will help maintain spacing at the radiocarpal level.   Given that he is also experiencing significant pain at the left thumb [facility] articulation secondary to degenerative change, he is indicated for left thumb CMC denervation to be performed in conjunction with his proximal row carpectomy.   Risks and benefits of the procedure were discussed, risks including but not limited to infection, bleeding, scarring, stiffness, nerve injury, tendon injury, vascular injury, hardware complication, recurrence of symptoms and need for subsequent operation.  Patient expressed understanding.  Will move forward with surgery as planned.

## 2023-03-13 NOTE — Anesthesia Postprocedure Evaluation (Signed)
Anesthesia Post Note  Patient: Philip Winters  Procedure(s) Performed: LEFT WRIST PROXIMAL ROW CARPECTOMY WITH PLACEMENT ALLOGRAFT SPACER / THUMB CMC DENERVATION (Left: Wrist)     Patient location during evaluation: PACU Anesthesia Type: Regional Level of consciousness: awake and alert Pain management: pain level controlled Vital Signs Assessment: post-procedure vital signs reviewed and stable Respiratory status: spontaneous breathing Cardiovascular status: stable Anesthetic complications: no   No notable events documented.  Last Vitals:  Vitals:   03/13/23 1045 03/13/23 1101  BP: 103/78 102/74  Pulse: 90 95  Resp: 15 18  Temp:  36.6 C  SpO2: 92% 92%    Last Pain:  Vitals:   03/13/23 1101  TempSrc:   PainSc: 0-No pain                 Lewie Loron

## 2023-03-14 ENCOUNTER — Encounter: Payer: Self-pay | Admitting: Orthopedic Surgery

## 2023-03-16 ENCOUNTER — Encounter (HOSPITAL_BASED_OUTPATIENT_CLINIC_OR_DEPARTMENT_OTHER): Payer: Self-pay | Admitting: Orthopedic Surgery

## 2023-03-16 ENCOUNTER — Ambulatory Visit (INDEPENDENT_AMBULATORY_CARE_PROVIDER_SITE_OTHER): Payer: Medicare Other | Admitting: Orthopedic Surgery

## 2023-03-16 DIAGNOSIS — M19132 Post-traumatic osteoarthritis, left wrist: Secondary | ICD-10-CM

## 2023-03-16 NOTE — Progress Notes (Signed)
Orthopedic update note: - Patient presented today for splint check, had noted significant tightness of the splint with discoloration of the digits over the weekend.  Was seen in a local urgent care, splint was slightly loosened and was instructed on elevation which did help improve his symptoms.  Currently comfortable today, splint is still well-fitting, has appropriate range of motion of the digits with appropriate sensation and capillary refill.  We will maintain the splint for the time being given that he is only 3 days postoperative, we will plan to see him at his regular scheduled visit for his 2-week postop check.  Pain is well-controlled, continue with elevation and pain medication as needed.  Samuella Cota, MD [facility], Hand Surgery 9:18 AM

## 2023-03-23 DIAGNOSIS — N486 Induration penis plastica: Secondary | ICD-10-CM | POA: Diagnosis not present

## 2023-03-23 DIAGNOSIS — N401 Enlarged prostate with lower urinary tract symptoms: Secondary | ICD-10-CM | POA: Diagnosis not present

## 2023-03-23 DIAGNOSIS — R3912 Poor urinary stream: Secondary | ICD-10-CM | POA: Diagnosis not present

## 2023-03-23 LAB — PSA: PSA: 1

## 2023-03-27 ENCOUNTER — Ambulatory Visit (INDEPENDENT_AMBULATORY_CARE_PROVIDER_SITE_OTHER): Payer: Medicare Other | Admitting: Orthopedic Surgery

## 2023-03-27 ENCOUNTER — Other Ambulatory Visit: Payer: Self-pay | Admitting: Orthopedic Surgery

## 2023-03-27 ENCOUNTER — Other Ambulatory Visit (INDEPENDENT_AMBULATORY_CARE_PROVIDER_SITE_OTHER): Payer: Self-pay

## 2023-03-27 DIAGNOSIS — M19132 Post-traumatic osteoarthritis, left wrist: Secondary | ICD-10-CM | POA: Diagnosis not present

## 2023-03-27 MED ORDER — OXYCODONE HCL 5 MG PO TABS: 5.0000 mg | ORAL_TABLET | ORAL | 0 refills | Status: DC | PRN

## 2023-03-27 NOTE — Progress Notes (Unsigned)
   BRISCO BOLEYN - 62 y.o. male MRN [id number]  Date of birth: 05-29-1960  Office Visit Note: Visit Date: 03/27/2023 PCP: Etta Grandchild, MD Referred by: Etta Grandchild, MD  Subjective:  HPI: Philip Winters is a 62 y.o. male who presents today for follow up 2weeks status post left wrist proximal row carpectomy with replacement allograft spacer/ thumb cmc denervation.   Pertinent ROS were reviewed with the patient and found to be negative unless otherwise specified above in HPI.   Assessment & Plan: Visit Diagnoses: No diagnosis found.  Plan: ***  Follow-up: No follow-ups on file.   Meds & Orders: No orders of the defined types were placed in this encounter.  No orders of the defined types were placed in this encounter.    Procedures: No procedures performed       Objective:   Vital Signs: There were no vitals taken for this visit.  Ortho Exam ***  Imaging: No results found.   Mairany Bruno Trevor Mace, M.D. [facility] 8:56 AM

## 2023-04-08 ENCOUNTER — Other Ambulatory Visit: Payer: Self-pay | Admitting: Orthopedic Surgery

## 2023-04-08 ENCOUNTER — Encounter: Payer: Self-pay | Admitting: Orthopedic Surgery

## 2023-04-08 MED ORDER — OXYCODONE HCL 5 MG PO TABS: 5.0000 mg | ORAL_TABLET | Freq: Four times a day (QID) | ORAL | 0 refills | Status: DC | PRN

## 2023-04-13 DIAGNOSIS — K080 Exfoliation of teeth due to systemic causes: Secondary | ICD-10-CM | POA: Diagnosis not present

## 2023-04-14 ENCOUNTER — Encounter: Payer: Self-pay | Admitting: Orthopaedic Surgery

## 2023-04-14 ENCOUNTER — Other Ambulatory Visit: Payer: Self-pay

## 2023-04-14 MED ORDER — DOXYCYCLINE HYCLATE 100 MG PO CAPS
ORAL_CAPSULE | ORAL | 2 refills | Status: DC
Start: 2023-04-14 — End: 2023-12-14

## 2023-04-15 DIAGNOSIS — M48061 Spinal stenosis, lumbar region without neurogenic claudication: Secondary | ICD-10-CM | POA: Diagnosis not present

## 2023-04-20 ENCOUNTER — Ambulatory Visit (INDEPENDENT_AMBULATORY_CARE_PROVIDER_SITE_OTHER): Payer: Medicare Other | Admitting: Orthopedic Surgery

## 2023-04-20 ENCOUNTER — Encounter: Payer: Self-pay | Admitting: Rehabilitative and Restorative Service Providers"

## 2023-04-20 ENCOUNTER — Other Ambulatory Visit: Payer: Self-pay

## 2023-04-20 ENCOUNTER — Ambulatory Visit: Payer: Medicare Other | Admitting: Rehabilitative and Restorative Service Providers"

## 2023-04-20 DIAGNOSIS — M6281 Muscle weakness (generalized): Secondary | ICD-10-CM

## 2023-04-20 DIAGNOSIS — R600 Localized edema: Secondary | ICD-10-CM | POA: Diagnosis not present

## 2023-04-20 DIAGNOSIS — M25632 Stiffness of left wrist, not elsewhere classified: Secondary | ICD-10-CM | POA: Diagnosis not present

## 2023-04-20 DIAGNOSIS — M19132 Post-traumatic osteoarthritis, left wrist: Secondary | ICD-10-CM

## 2023-04-20 DIAGNOSIS — R278 Other lack of coordination: Secondary | ICD-10-CM

## 2023-04-20 DIAGNOSIS — M25532 Pain in left wrist: Secondary | ICD-10-CM | POA: Diagnosis not present

## 2023-04-20 NOTE — Progress Notes (Signed)
   Philip Winters - 61 y.o. male MRN [id number]  Date of birth: 03/19/1961  Office Visit Note: Visit Date: 04/20/2023 PCP: Etta Grandchild, MD Referred by: Etta Grandchild, MD  Subjective:  HPI: Philip Winters is a 62 y.o. male who presents today for follow up 5 weeks status post left wrist proximal row carpectomy with placement allograft spacer/ thumb cmc denervation.  He is doing well overall, pain is controlled.  Pertinent ROS were reviewed with the patient and found to be negative unless otherwise specified above in HPI.   Assessment & Plan: Visit Diagnoses: No diagnosis found.  Plan: He is doing well postoperatively.  Swelling has improved significantly, pain is controlled.  He will begin occupational therapy today for range of motion exercises for the next 3 weeks.  I will see him back in 3 weeks, at that juncture my hope is that we can begin strengthening.  Follow-up: No follow-ups on file.   Meds & Orders: No orders of the defined types were placed in this encounter.  No orders of the defined types were placed in this encounter.    Procedures: No procedures performed       Objective:   Vital Signs: There were no vitals taken for this visit.  Ortho Exam Left wrist - Well-healed dorsal incision, well-healed thumb CMC incision without erythema or drainage - Wrist range of motion flexion 20 degrees, extension 10 degrees - Composite fist without significant restriction, sensation intact diffusely, hand is warm well-perfused  Imaging: No results found.   Philip Winters, M.D. [facility] 12:50 PM

## 2023-04-20 NOTE — Therapy (Signed)
OUTPATIENT OCCUPATIONAL THERAPY ORTHO EVALUATION  Patient Name: Philip Winters MRN: [id number] DOB:29-Jan-1961, 62 y.o., male Today's Date: 04/20/2023  PCP: Sanda Linger, MD REFERRING PROVIDER:  Samuella Cota, MD    END OF SESSION:  OT End of Session - 04/20/23 1349     Visit Number 1    Number of Visits 8    Date for OT Re-Evaluation 06/05/23    Authorization Type [employer] Medicare    OT Start Time 1350    OT Stop Time 1439    OT Time Calculation (min) 49 min    Equipment Utilized During Treatment orthotic materials    Activity Tolerance Patient tolerated treatment well;No increased pain;Patient limited by fatigue;Patient limited by pain    Behavior During Therapy [facility] for tasks assessed/performed             Past Medical History:  Diagnosis Date   Allergy    Arthritis    CAD (coronary artery disease)    GERD (gastroesophageal reflux disease)    occ   Heart murmur    baby   High cholesterol    Hypertension    Peripheral neuropathy    Pneumonia    Right carotid bruit 06/10/2017   Sciatica    Seasonal allergies    Sinus tachycardia    Spinal stenosis    Past Surgical History:  Procedure Laterality Date   CARDIAC CATHETERIZATION     CARPAL TUNNEL WITH CUBITAL TUNNEL Left 11/10/2013   Procedure: LEFT CARPAL TUNNEL RELEASE;  Surgeon: Wyn Forster, MD;  Location: [facility];  Service: Orthopedics;  Laterality: Left;   CARPECTOMY Left 03/13/2023   Procedure: LEFT WRIST PROXIMAL ROW CARPECTOMY WITH PLACEMENT ALLOGRAFT SPACER / THUMB CMC DENERVATION;  Surgeon: Samuella Cota, MD;  Location: [facility];  Service: Orthopedics;  Laterality: Left;   COLONOSCOPY  2023   CORONARY ARTERY BYPASS GRAFT N/A 05/01/2020   Procedure: CORONARY ARTERY BYPASS GRAFTING (CABG) X 2 ON CARDIOPULMONARY BYPASS. LIMA TO LAD, SVG TO DIAG;  Surgeon: Kerin Perna, MD;  Location: [facility] OR;  Service: Open Heart Surgery;  Laterality: N/A;   ENDOVEIN HARVEST OF  GREATER SAPHENOUS VEIN Right 05/01/2020   Procedure: ENDOVEIN HARVEST OF GREATER SAPHENOUS VEIN;  Surgeon: Kerin Perna, MD;  Location: [facility] OR;  Service: Open Heart Surgery;  Laterality: Right;   KNEE ARTHROSCOPY  [id number]   left and right   LUMBAR LAMINECTOMY/DECOMPRESSION MICRODISCECTOMY Left 09/29/2017   Procedure: LEFT LUMBAR TWO - LUMBAR THREELAMINOTOMY/MICRODISCECTOMY;  Surgeon: Shirlean Kelly, MD;  Location: [facility] OR;  Service: Neurosurgery;  Laterality: Left;  LEFT LUMBAR 2- LUMBAR 3 LAMINOTOMY/MICRODISCECTOMY   NASAL SINUS SURGERY  05/2015   RIGHT/LEFT HEART CATH AND CORONARY ANGIOGRAPHY N/A 04/10/2020   Procedure: RIGHT/LEFT HEART CATH AND CORONARY ANGIOGRAPHY;  Surgeon: Lennette Bihari, MD;  Location: [facility];  Service: Cardiovascular;  Laterality: N/A;   TEE WITHOUT CARDIOVERSION N/A 05/01/2020   Procedure: TRANSESOPHAGEAL ECHOCARDIOGRAM (TEE);  Surgeon: Donata Clay, Theron Arista, MD;  Location: [facility] OR;  Service: Open Heart Surgery;  Laterality: N/A;   TOTAL KNEE ARTHROPLASTY Right 06/24/2016   Procedure: TOTAL KNEE ARTHROPLASTY;  Surgeon: Valeria Batman, MD;  Location: [facility] OR;  Service: Orthopedics;  Laterality: Right;   TOTAL KNEE ARTHROPLASTY Left 12/18/2020   Procedure: LEFT TOTAL KNEE ARTHROPLASTY;  Surgeon: Valeria Batman, MD;  Location: [facility];  Service: Orthopedics;  Laterality: Left;   TRANSFORAMINAL LUMBAR INTERBODY FUSION W/ MIS 2 LEVEL Left 04/15/2022  Procedure: Minimally Invasive Surgery Decompression and Transforaminal Lumbar Interbody Fusion , Left , Lumbar three-four, Lumbar four-five;  Surgeon: Dawley, Alan Mulder, DO;  Location: [facility] OR;  Service: Neurosurgery;  Laterality: Left;   TRIGGER FINGER RELEASE Left 11/10/2013   Procedure: LEFT INDEX A-1 PULLEY RELEASE;  Surgeon: Wyn Forster, MD;  Location: [facility];  Service: Orthopedics;  Laterality: Left;   ULNAR NERVE TRANSPOSITION Left 11/10/2013   Procedure: ULNAR NERVE TRANSPOSITION;   Surgeon: Wyn Forster, MD;  Location: [facility];  Service: Orthopedics;  Laterality: Left;   Patient Active Problem List   Diagnosis Date Noted   Scapholunate advanced collapse of left wrist 03/13/2023   Arthritis of carpometacarpal [facility]) joint of left thumb 03/13/2023   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) 09/22/2022   Encounter for general adult medical examination with abnormal findings 09/22/2022   Encounter for screening for HIV 09/18/2022   Need for hepatitis C screening test 09/18/2022   Numbness in feet 09/18/2022   Tachycardia 09/18/2022   Dyslipidemia, goal LDL below 70 09/18/2022   Idiopathic small fiber peripheral neuropathy 09/18/2022   Chronic hyperglycemia 09/18/2022   Lumbar spinal stenosis 04/15/2022   Chronic pain of left thumb 02/25/2022   SBE (subacute bacterial endocarditis) prophylaxis candidate 12/16/2021   Cervical radiculopathy 08/13/2021   Ulnar neuropathy 08/13/2021   Screen for colon cancer 06/06/2021   Chronic tachycardia 06/04/2021   Candidal balanitis 06/04/2021   Carpal tunnel syndrome, bilateral upper limbs 09/26/2020   Cubital tunnel syndrome on left 08/23/2020   Pulmonary nodule 1 cm or greater in diameter 04/26/2020   Statin intolerance 04/20/2020   Coronary atherosclerosis of native coronary artery 04/18/2020   Nonrheumatic aortic valve stenosis    Bicuspid aortic valve 04/06/2020   Primary osteoarthritis of left knee 11/08/2019   Bilateral primary osteoarthritis of knee 09/28/2019   HNP (herniated nucleus pulposus), lumbar 09/29/2017   Pain in left wrist 10/09/2016   Unilateral primary osteoarthritis, right knee 06/24/2016   Primary hypertension 01/18/2007    ONSET DATE: DOS 03/13/23  REFERRING DIAG: T06.269 (ICD-10-CM) - [place] (scapholunate advanced collapse) of wrist, left   THERAPY DIAG:  Pain in left wrist  Stiffness of left wrist, not elsewhere classified  Muscle weakness  (generalized)  Localized edema  Other lack of coordination  Rationale for Evaluation and Treatment: Rehabilitation  SUBJECTIVE:   SUBJECTIVE STATEMENT: 5+ weeks status post left wrist proximal row carpectomy with replacement allograft spacer/ thumb cmc denervation.  He has a long history of arthritis in both hands and wrists and he states that he was unable to make a complete fist even before the surgery.  He also states having some pain now postop, just had a cast removed today and that his stitches were taken out 2 to 3 weeks ago.  He is currently has Steri-Strips on.  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    PRECAUTIONS: None  RED FLAGS: None   WEIGHT BEARING RESTRICTIONS: Yes NWB in Lt wrist/hand for 2-3 weeks  PAIN:  Are you having pain? Yes: NPRS scale: 5-6/10 Pain location: Lt wrist Pain description: aching, worse at night Aggravating factors: Intermittent swelling and any attempted weightbearing Relieving factors: Rest, elevation  FALLS: Has patient fallen in last 6 months? No  LIVING ENVIRONMENT: Lives with: lives  with their spouse Lives in: House/apartment Has following equipment at home: None  PLOF: Independent  PATIENT GOALS: Improve the use of his left nondominant side  NEXT MD VISIT: 05/18/2023   OBJECTIVE: (All objective assessments below are from initial evaluation on: 04/20/23 unless otherwise specified.)    HAND DOMINANCE: Right   ADLs: Overall ADLs: States decreased ability to grab, hold household objects, pain and difficulty to open containers, perform FMS tasks (manipulate fasteners on clothing), mild to moderate bathing problems as well.    FUNCTIONAL OUTCOME MEASURES: Eval: Quick DASH TBD next session impairment today  (Higher % Score  =  More Impairment)    UPPER EXTREMITY ROM     Shoulder to Wrist  AROM Left eval  Forearm supination 68  Forearm pronation  75  Wrist flexion 21  Wrist extension 11  Wrist ulnar deviation 7  Wrist radial deviation 5  Functional dart thrower's motion (F-DTM) in ulnar flexion   F-DTM in radial extension    (Blank rows = not tested)   Hand AROM Left eval  Full Fist Ability (or Gap to Distal Palmar Crease) 4.9cm gap from tip of MF to [facility]  Thumb Opposition  (Kapandji Scale)  4/10  (Blank rows = not tested)   UPPER EXTREMITY MMT:    Eval:  NT at eval due to recent and still healing injuries. Will be tested when appropriate.   MMT Left TBD  Elbow flexion   Elbow extension   Forearm supination   Forearm pronation   Wrist flexion   Wrist extension   Wrist ulnar deviation   Wrist radial deviation   (Blank rows = not tested)  HAND FUNCTION: Eval: Observed weakness in affected Lt hand.  Details TBD when safe Grip strength Right: TBD lbs, Left: TBD lbs   COORDINATION: Eval: Observed coordination impairments with affected Lt hand.  Details TBD when able Box and Blocks Test: TBD Blocks today (TBD is [facility])  SENSATION: Eval:  States light touch intact today, even into thumb dorsum and around sx area, though diminished somewhat around sx area    EDEMA:   Eval:  Mildly swollen in Lt hand and wrist today  COGNITION: Eval: Overall cognitive status: WFL for evaluation today   OBSERVATIONS:   Eval: He has an apparent ulnar sagging in the wrist and difficulty with radial deviation.  The surgical area is covered by Steri-Strips and looks to be crusted with some dried blood but otherwise healing well and no significant cardinal signs of infection.  Skin is dry.  He is unable to make a full fist or oppose to his middle finger.    TODAY'S TREATMENT:  Post-evaluation treatment:   He is given initial self-care/safety recommendations to avoid gripping, pushing, pulling with the left hand and wrist for right now.  As he does complain about shoulder  stiffness and soreness and weakness, OT provides him with a red therapy band that he can a loop in place near the elbow to do shoulder abduction isometrics and shoulder flexion isometrics.  He trials these things and states they work well and cause no pain to the wrist or forearm.  He can do this as desired 2 or 3 times a day 1-2 sets of 10 repetitions with 5 seconds squeezes.  Next, to protect his wrist, custom orthotic fabrication was indicated due to pt's recent wrist surgery and need for safe, functional positioning and support. OT fabricated custom clamshell wrist immobilization orthosis for pt today to protect his healing wrist.  It fit well with no areas of pressure, pt states a comfortable fit. Pt was educated on the wearing schedule (wear wrist orthosis until 7 weeks post-op at night, ok to wean for light activities now/ as tolerated, otherwise on for everything but exercises and hygiene), to avoid exposing it to sources of heat, to wipe clean as needed (do not wash, use harsh detergents), to call or come in ASAP if it is causing any irritation or is not achieving desired function. It will be checked/adjusted in upcoming sessions, as needed. Pt states understanding all directions.    Next he was given the following home exercises to perform 4 times a day to tension points with no significant pain or problem.  He was shown how to use elbow flexion and elbow extension to support pronation and supination motions.  Attention was given toward radial deviation as he has this "ulnar sagging," and he was advised to perform this nonpainfully but rigorously.  Exercises - Standing Elbow Flexion Extension AROM  - 4-6 x daily - 10-15 reps - Turn [employer] Facing Up & Down  - 4-6 x daily - 10-15 reps - Bend and Pull Back Wrist SLOWLY  - 4 x daily - 10-15 reps - "Windshield Wipers"   - 4 x daily - 10-15 reps - Tendon Glides  - 4-6 x daily - 3-5 reps - 2-3 seconds hold - Thumb Opposition  - 4-6 x daily - 10  reps  He demonstrates back of these exercises without any significant pain and feeling appropriate tension.  He leaves without any significant increase in pain.  PATIENT EDUCATION: Education details: See tx section above for details  Person educated: Patient Education method: Verbal Instruction, Teach back, Handouts  Education comprehension: States and demonstrates understanding, Additional Education required    HOME EXERCISE PROGRAM: Access Code: [email or web address] URL: [email or web address] Date: 04/20/2023 Prepared by: Fannie Knee   GOALS: Goals reviewed with patient? Yes   SHORT TERM GOALS: (STG required if POC>30 days) Target Date: 05/15/23  Pt will obtain protective, custom orthotic. Goal status: 04/20/23 MET   2.  Pt will demo/state understanding of initial HEP to improve pain levels and prerequisite motion. Goal status: INITIAL   LONG TERM GOALS: Target Date: 06/05/23  Pt will improve functional ability by decreased impairment per Quick DASH assessment from TBD to TBD or better, for better quality of life. Goal status: INITIAL-TBD next session baseline measure  2.  Pt will improve grip strength in Lt hand from unsafe to test to at least 40lbs for functional use at home and in IADLs. Goal status: INITIAL  3.  Pt will improve A/ROM in Lt wrist flex/ext from 21/11 to at least 30/25* to have better functional motion for tasks like reach and grasp.  Goal status: INITIAL  4.  Pt will improve strength in left forearm and wrist from unsafe to test  to at least 4+/5 MMT to have increased functional ability to carry out selfcare and higher-level homecare tasks with less difficulty. Goal status: INITIAL  5.  Pt will improve coordination skills in left arm, as seen by within functional limit score on box and blocks testing to have increased functional ability to carry out fine motor tasks (fasteners, etc.) and more complex, coordinated IADLs (meal prep, sports, etc.).   Goal status: INITIAL  6.  Pt will decrease pain at rest from 5-6/10 to 1/10 or better to have better sleep and occupational participation in daily roles. Goal status: INITIAL'  ASSESSMENT:  CLINICAL IMPRESSION:  Patient is a 62 y.o. male who was seen today for occupational therapy evaluation for left proximal row carpectomy and CMC denervation.  Subsequently, he has stiffness, swelling, pain, sensitivity changes and decreased functional ability and he will benefit from outpatient occupational therapy to decrease negative symptoms and increase quality of life.   PERFORMANCE DEFICITS: in functional skills including ADLs, IADLs, coordination, proprioception, sensation, edema, ROM, strength, pain, fascial restrictions, flexibility, Fine motor control, Gross motor control, body mechanics, decreased knowledge of precautions, wound, and UE functional use, cognitive skills including problem solving and safety awareness, and psychosocial skills including coping strategies, environmental adaptation, and habits.   IMPAIRMENTS: are limiting patient from ADLs, IADLs, rest and sleep, leisure, and social participation.   COMORBIDITIES: may have co-morbidities  that affects occupational performance. Patient will benefit from skilled OT to address above impairments and improve overall function.  MODIFICATION OR ASSISTANCE TO COMPLETE EVALUATION: No modification of tasks or assist necessary to complete an evaluation.  OT OCCUPATIONAL PROFILE AND HISTORY: Problem focused assessment: Including review of records relating to presenting problem.  CLINICAL DECISION MAKING: Moderate - several treatment options, min-mod task modification necessary  REHAB POTENTIAL: Excellent  EVALUATION COMPLEXITY: Low      PLAN:  OT FREQUENCY: 1-2x/week  OT DURATION: 6 weeks through 06/05/23 as needed  PLANNED INTERVENTIONS: 97168 OT Re-evaluation, 97535 self care/ADL training, 44010 therapeutic exercise, 97530  therapeutic activity, 97112 neuromuscular re-education, 97140 manual therapy, 97035 ultrasound, 97039 fluidotherapy, 97010 moist heat, 97010 cryotherapy, 97034 contrast bath, 97760 Orthotics management and training, 27253 Splinting (initial encounter), [id number] Subsequent splinting/medication, scar mobilization, compression bandaging, Dry needling, energy conservation, coping strategies training, patient/family education, and DME and/or AE instructions  RECOMMENDED OTHER SERVICES:   None now   CONSULTED AND AGREED WITH PLAN OF CARE: Patient  PLAN FOR NEXT SESSION:   Review home exercise program and initial recommendations, check orthosis as needed, advance to light stretches as tolerated, wean orthosis starting 7 weeks (including at night as tolerated), strengthening can start at 8 weeks lightly as tolerated       Marlan Steward, OTR/L, CHT 04/20/2023, 3:09 PM

## 2023-04-21 DIAGNOSIS — K080 Exfoliation of teeth due to systemic causes: Secondary | ICD-10-CM | POA: Diagnosis not present

## 2023-04-21 NOTE — Therapy (Signed)
OUTPATIENT OCCUPATIONAL THERAPY TREATMENT NOTE  Patient Name: Philip Winters MRN: [id number] DOB:06-Sep-1960, 62 y.o., male Today's Date: 04/22/2023  PCP: Sanda Linger, MD REFERRING PROVIDER:  Samuella Cota, MD    END OF SESSION:  OT End of Session - 04/22/23 1302     Visit Number 2    Number of Visits 8    Date for OT Re-Evaluation 06/05/23    Authorization Type BCBS Medicare    OT Start Time 1302    OT Stop Time 1345    OT Time Calculation (min) 43 min    Equipment Utilized During Treatment --    Activity Tolerance Patient tolerated treatment well;No increased pain;Patient limited by fatigue;Patient limited by pain    Behavior During Therapy [facility] for tasks assessed/performed              Past Medical History:  Diagnosis Date   Allergy    Arthritis    CAD (coronary artery disease)    GERD (gastroesophageal reflux disease)    occ   Heart murmur    baby   High cholesterol    Hypertension    Peripheral neuropathy    Pneumonia    Right carotid bruit 06/10/2017   Sciatica    Seasonal allergies    Sinus tachycardia    Spinal stenosis    Past Surgical History:  Procedure Laterality Date   CARDIAC CATHETERIZATION     CARPAL TUNNEL WITH CUBITAL TUNNEL Left 11/10/2013   Procedure: LEFT CARPAL TUNNEL RELEASE;  Surgeon: Wyn Forster, MD;  Location: [facility];  Service: Orthopedics;  Laterality: Left;   CARPECTOMY Left 03/13/2023   Procedure: LEFT WRIST PROXIMAL ROW CARPECTOMY WITH PLACEMENT ALLOGRAFT SPACER / THUMB CMC DENERVATION;  Surgeon: Samuella Cota, MD;  Location:  SURGERY CENTER;  Service: Orthopedics;  Laterality: Left;   COLONOSCOPY  2023   CORONARY ARTERY BYPASS GRAFT N/A 05/01/2020   Procedure: CORONARY ARTERY BYPASS GRAFTING (CABG) X 2 ON CARDIOPULMONARY BYPASS. LIMA TO LAD, SVG TO DIAG;  Surgeon: Kerin Perna, MD;  Location: [facility] OR;  Service: Open Heart Surgery;  Laterality: N/A;   ENDOVEIN HARVEST OF GREATER  SAPHENOUS VEIN Right 05/01/2020   Procedure: ENDOVEIN HARVEST OF GREATER SAPHENOUS VEIN;  Surgeon: Kerin Perna, MD;  Location: [facility] OR;  Service: Open Heart Surgery;  Laterality: Right;   KNEE ARTHROSCOPY  [id number]   left and right   LUMBAR LAMINECTOMY/DECOMPRESSION MICRODISCECTOMY Left 09/29/2017   Procedure: LEFT LUMBAR TWO - LUMBAR THREELAMINOTOMY/MICRODISCECTOMY;  Surgeon: Shirlean Kelly, MD;  Location: [facility] OR;  Service: Neurosurgery;  Laterality: Left;  LEFT LUMBAR 2- LUMBAR 3 LAMINOTOMY/MICRODISCECTOMY   NASAL SINUS SURGERY  05/2015   RIGHT/LEFT HEART CATH AND CORONARY ANGIOGRAPHY N/A 04/10/2020   Procedure: RIGHT/LEFT HEART CATH AND CORONARY ANGIOGRAPHY;  Surgeon: Lennette Bihari, MD;  Location: [facility];  Service: Cardiovascular;  Laterality: N/A;   TEE WITHOUT CARDIOVERSION N/A 05/01/2020   Procedure: TRANSESOPHAGEAL ECHOCARDIOGRAM (TEE);  Surgeon: Donata Clay, Theron Arista, MD;  Location: [facility] OR;  Service: Open Heart Surgery;  Laterality: N/A;   TOTAL KNEE ARTHROPLASTY Right 06/24/2016   Procedure: TOTAL KNEE ARTHROPLASTY;  Surgeon: Valeria Batman, MD;  Location: [facility] OR;  Service: Orthopedics;  Laterality: Right;   TOTAL KNEE ARTHROPLASTY Left 12/18/2020   Procedure: LEFT TOTAL KNEE ARTHROPLASTY;  Surgeon: Valeria Batman, MD;  Location: [facility]S;  Service: Orthopedics;  Laterality: Left;   TRANSFORAMINAL LUMBAR INTERBODY FUSION W/ MIS 2 LEVEL Left 04/15/2022  Procedure: Minimally Invasive Surgery Decompression and Transforaminal Lumbar Interbody Fusion , Left , Lumbar three-four, Lumbar four-five;  Surgeon: Dawley, Alan Mulder, DO;  Location: [facility] OR;  Service: Neurosurgery;  Laterality: Left;   TRIGGER FINGER RELEASE Left 11/10/2013   Procedure: LEFT INDEX A-1 PULLEY RELEASE;  Surgeon: Wyn Forster, MD;  Location: [facility];  Service: Orthopedics;  Laterality: Left;   ULNAR NERVE TRANSPOSITION Left 11/10/2013   Procedure: ULNAR NERVE TRANSPOSITION;  Surgeon:  Wyn Forster, MD;  Location: [facility];  Service: Orthopedics;  Laterality: Left;   Patient Active Problem List   Diagnosis Date Noted   Scapholunate advanced collapse of left wrist 03/13/2023   Arthritis of carpometacarpal [facility]) joint of left thumb 03/13/2023   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) 09/22/2022   Encounter for general adult medical examination with abnormal findings 09/22/2022   Encounter for screening for HIV 09/18/2022   Need for hepatitis C screening test 09/18/2022   Numbness in feet 09/18/2022   Tachycardia 09/18/2022   Dyslipidemia, goal LDL below 70 09/18/2022   Idiopathic small fiber peripheral neuropathy 09/18/2022   Chronic hyperglycemia 09/18/2022   Lumbar spinal stenosis 04/15/2022   Chronic pain of left thumb 02/25/2022   SBE (subacute bacterial endocarditis) prophylaxis candidate 12/16/2021   Cervical radiculopathy 08/13/2021   Ulnar neuropathy 08/13/2021   Screen for colon cancer 06/06/2021   Chronic tachycardia 06/04/2021   Candidal balanitis 06/04/2021   Carpal tunnel syndrome, bilateral upper limbs 09/26/2020   Cubital tunnel syndrome on left 08/23/2020   Pulmonary nodule 1 cm or greater in diameter 04/26/2020   Statin intolerance 04/20/2020   Coronary atherosclerosis of native coronary artery 04/18/2020   Nonrheumatic aortic valve stenosis    Bicuspid aortic valve 04/06/2020   Primary osteoarthritis of left knee 11/08/2019   Bilateral primary osteoarthritis of knee 09/28/2019   HNP (herniated nucleus pulposus), lumbar 09/29/2017   Pain in left wrist 10/09/2016   Unilateral primary osteoarthritis, right knee 06/24/2016   Primary hypertension 01/18/2007    ONSET DATE: DOS 03/13/23  REFERRING DIAG: H08.657 (ICD-10-CM) - [place] (scapholunate advanced collapse) of wrist, left   THERAPY DIAG:  Pain in left wrist  Stiffness of left wrist, not elsewhere classified  Muscle weakness (generalized)  Localized  edema  Other lack of coordination  Rationale for Evaluation and Treatment: Rehabilitation  PERTINENT HISTORY: Was seen in hand therapy in 2023 for thumb fx as well as trigger finger, but he only needed to attended 2 sessions.   He has a long history of arthritis in both hands and wrists and he states that he was unable to make a complete fist even before the surgery.  He also states having some pain now postop, just had a cast removed today and that his stitches were taken out 2 to 3 weeks ago.  He is currently has Steri-Strips on.  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  He states swollen yesterday when at the dentist's office.     PAIN:  Are you having pain? Yes: NPRS scale: 2-3/10, mainly stiff Pain location: Lt wrist Pain description: aching, worse at night Aggravating factors: Intermittent swelling and any attempted weightbearing Relieving factors: Rest, elevation  PATIENT  GOALS: Improve the use of his left nondominant side  NEXT MD VISIT: 05/18/2023   OBJECTIVE: (All objective assessments below are from initial evaluation on: 04/20/23 unless otherwise specified.)    HAND DOMINANCE: Right   ADLs: Overall ADLs: States decreased ability to grab, hold household objects, pain and difficulty to open containers, perform FMS tasks (manipulate fasteners on clothing), mild to moderate bathing problems as well.    FUNCTIONAL OUTCOME MEASURES: 04/22/23:  Quick DASH 35% impairment today  (Higher % Score  =  More Impairment)     UPPER EXTREMITY ROM     Shoulder to Wrist AROM Left eval Lt 04/22/23  Forearm supination 68 73  Forearm pronation  75 72  Wrist flexion 21 16  Wrist extension 11 13  Wrist ulnar deviation 7    Wrist radial deviation 5   Functional dart thrower's motion (F-DTM) in ulnar flexion    F-DTM in radial extension     (Blank rows = not tested)   Hand AROM Left eval  Full Fist Ability (or Gap to Distal Palmar Crease) 4.9cm gap from tip of MF to [facility]  Thumb Opposition  (Kapandji Scale)  4/10  (Blank rows = not tested)   UPPER EXTREMITY MMT:    Eval:  NT at eval due to recent and still healing injuries. Will be tested when appropriate.   MMT Left TBD  Elbow flexion   Elbow extension   Forearm supination   Forearm pronation   Wrist flexion   Wrist extension   Wrist ulnar deviation   Wrist radial deviation   (Blank rows = not tested)  HAND FUNCTION: Eval: Observed weakness in affected Lt hand.  Details TBD when safe Grip strength Right: TBD lbs, Left: TBD lbs   COORDINATION: 04/22/23: Box and Blocks Test: 28 Blocks today (54 is WFL)  Eval: Observed coordination impairments with affected Lt hand.  Details TBD when able   SENSATION: Eval:  States light touch intact today, even into thumb dorsum and around sx area, though diminished somewhat around sx area    EDEMA:   Eval:  Mildly swollen in Lt hand and wrist today  OBSERVATIONS:   Eval: He has an apparent ulnar sagging in the wrist and difficulty with radial deviation.  The surgical area is covered by Steri-Strips and looks to be crusted with some dried blood but otherwise healing well and no significant cardinal signs of infection.  Skin is dry.  He is unable to make a full fist or oppose to his middle finger.    TODAY'S TREATMENT:  04/22/23: He started with active range of motion for exercise as well as new measures which does show some continued stiffness perhaps exacerbated by the trip to the dentist office.  He was advised that he could leave off his orthosis during the dental procedure so he can gently move his wrist and forearm better.  Reviewed home exercise program and initial recommendations, added stretches as  bolded below to be done non-painfully and slowly. He tolerates theses well with OT hand over hand assist and finishes with 3 mins ice to relieve pain/swelling. He leaves in no significant pain, states understanding.   Exercises - Standing Elbow Flexion Extension AROM  - 4-6 x daily - 10-15 reps - Turn [employer] Facing Up & Down  - 4-6 x daily - 10-15 reps - Bend and Pull Back Wrist SLOWLY  - 4 x daily - 10-15 reps - "Windshield Wipers"   - 4 x daily - 10-15  reps - Tendon Glides  - 4-6 x daily - 3-5 reps - 2-3 seconds hold - Thumb Opposition  - 4-6 x daily - 10 reps - Forearm Supination Stretch  - 3-4 x daily - 3-5 reps - 15 sec hold - Forearm Pronation Stretch  - 3-4 x daily - 3-5 reps - 15 sec hold - Wrist Flexion Stretch  - 4 x daily - 3-5 reps - 15 sec hold - Wrist Extension Stretch Pronated  - 4 x daily - 3-5 reps - 15 hold - BACK KNUCKLE STRETCHES   - 4 x daily - 3-5 reps - 15 sec hold - HOOK Stretch  - 4 x daily - 3-5 reps - 15-20 sec hold - Seated Finger Composite Flexion Stretch  - 4 x daily - 3-5 reps - 15 hold - PUSH KNUCKLES DOWN  - 4 x daily - 3-5 reps - 15 seconds hold   PATIENT EDUCATION: Education details: See tx section above for details  Person educated: Patient Education method: Engineer, structural, Teach back, Handouts  Education comprehension: States and demonstrates understanding, Additional Education required    HOME EXERCISE PROGRAM: Access Code: [email or web address] URL: [email or web address] Date: 04/20/2023 Prepared by: Fannie Knee   GOALS: Goals reviewed with patient? Yes   SHORT TERM GOALS: (STG required if POC>30 days) Target Date: 05/15/23  Pt will obtain protective, custom orthotic. Goal status: 04/20/23 MET   2.  Pt will demo/state understanding of initial HEP to improve pain levels and prerequisite motion. Goal status: 04/22/23: MET   LONG TERM GOALS: Target Date: 06/05/23  Pt will improve functional ability by decreased impairment per  Quick DASH assessment from 35% to 20% or better, for better quality of life. Goal status: INITIAL  2.  Pt will improve grip strength in Lt hand from unsafe to test to at least 40lbs for functional use at home and in IADLs. Goal status: INITIAL  3.  Pt will improve A/ROM in Lt wrist flex/ext from 21/11 to at least 30/25* to have better functional motion for tasks like reach and grasp.  Goal status: INITIAL  4.  Pt will improve strength in left forearm and wrist from unsafe to test  to at least 4+/5 MMT to have increased functional ability to carry out selfcare and higher-level homecare tasks with less difficulty. Goal status: INITIAL  5.  Pt will improve coordination skills in left arm, as seen by within functional limit score on box and blocks testing to have increased functional ability to carry out fine motor tasks (fasteners, etc.) and more complex, coordinated IADLs (meal prep, sports, etc.).  Goal status: INITIAL  6.  Pt will decrease pain at rest from 5-6/10 to 1/10 or better to have better sleep and occupational participation in daily roles. Goal status: INITIAL'  ASSESSMENT:  CLINICAL IMPRESSION: 04/22/23: He is tolerating light stretches well, still struggling with some swelling and healing.  OT encourages him to not ever forced motion and that the typical outcomes for this are about 35 degrees of wrist flexion and 35 degrees of wrist extension (or 50% of the opposite side).  Carry on   PLAN:  OT FREQUENCY: 1-2x/week  OT DURATION: 6 weeks through 06/05/23 as needed  PLANNED INTERVENTIONS: 97168 OT Re-evaluation, 97535 self care/ADL training, 40981 therapeutic exercise, 97530 therapeutic activity, 97112 neuromuscular re-education, 97140 manual therapy, 97035 ultrasound, 97039 fluidotherapy, 97010 moist heat, 97010 cryotherapy, 97034 contrast bath, 97760 Orthotics management and training, 19147 Splinting (initial encounter), [id number] Subsequent splinting/medication, scar mobilization,  compression bandaging,  Dry needling, energy conservation, coping strategies training, patient/family education, and DME and/or AE instructions  CONSULTED AND AGREED WITH PLAN OF CARE: Patient  PLAN FOR NEXT SESSION:   Check on new stretches, wean orthosis starting 6-7 weeks, strengthening can start at 8 weeks lightly as tolerated       Fannie Knee, OTR/L, CHT 04/22/2023, 3:01 PM

## 2023-04-22 ENCOUNTER — Encounter: Payer: Self-pay | Admitting: Rehabilitative and Restorative Service Providers"

## 2023-04-22 ENCOUNTER — Ambulatory Visit: Payer: Medicare Other | Admitting: Rehabilitative and Restorative Service Providers"

## 2023-04-22 DIAGNOSIS — R600 Localized edema: Secondary | ICD-10-CM

## 2023-04-22 DIAGNOSIS — M25632 Stiffness of left wrist, not elsewhere classified: Secondary | ICD-10-CM

## 2023-04-22 DIAGNOSIS — M6281 Muscle weakness (generalized): Secondary | ICD-10-CM

## 2023-04-22 DIAGNOSIS — M25532 Pain in left wrist: Secondary | ICD-10-CM

## 2023-04-22 DIAGNOSIS — R278 Other lack of coordination: Secondary | ICD-10-CM

## 2023-04-24 NOTE — Therapy (Signed)
OUTPATIENT OCCUPATIONAL THERAPY TREATMENT NOTE  Patient Name: Philip Winters MRN: [id number] DOB:February 15, 1961, 62 y.o., male Today's Date: 04/27/2023  PCP: Sanda Linger, MD REFERRING PROVIDER:  Samuella Cota, MD    END OF SESSION:  OT End of Session - 04/27/23 0932     Visit Number 3    Number of Visits 8    Date for OT Re-Evaluation 06/05/23    Authorization Type BCBS Medicare    OT Start Time 0932    OT Stop Time 1026    OT Time Calculation (min) 54 min    Activity Tolerance Patient tolerated treatment well;No increased pain;Patient limited by fatigue;Patient limited by pain    Behavior During Therapy [facility] for tasks assessed/performed            Past Medical History:  Diagnosis Date   Allergy    Arthritis    CAD (coronary artery disease)    GERD (gastroesophageal reflux disease)    occ   Heart murmur    baby   High cholesterol    Hypertension    Peripheral neuropathy    Pneumonia    Right carotid bruit 06/10/2017   Sciatica    Seasonal allergies    Sinus tachycardia    Spinal stenosis    Past Surgical History:  Procedure Laterality Date   CARDIAC CATHETERIZATION     CARPAL TUNNEL WITH CUBITAL TUNNEL Left 11/10/2013   Procedure: LEFT CARPAL TUNNEL RELEASE;  Surgeon: Wyn Forster, MD;  Location: [facility];  Service: Orthopedics;  Laterality: Left;   CARPECTOMY Left 03/13/2023   Procedure: LEFT WRIST PROXIMAL ROW CARPECTOMY WITH PLACEMENT ALLOGRAFT SPACER / THUMB CMC DENERVATION;  Surgeon: Samuella Cota, MD;  Location: [facility];  Service: Orthopedics;  Laterality: Left;   COLONOSCOPY  2023   CORONARY ARTERY BYPASS GRAFT N/A 05/01/2020   Procedure: CORONARY ARTERY BYPASS GRAFTING (CABG) X 2 ON CARDIOPULMONARY BYPASS. LIMA TO LAD, SVG TO DIAG;  Surgeon: Kerin Perna, MD;  Location: [facility] OR;  Service: Open Heart Surgery;  Laterality: N/A;   ENDOVEIN HARVEST OF GREATER SAPHENOUS VEIN Right 05/01/2020   Procedure: ENDOVEIN  HARVEST OF GREATER SAPHENOUS VEIN;  Surgeon: Kerin Perna, MD;  Location: [facility] OR;  Service: Open Heart Surgery;  Laterality: Right;   KNEE ARTHROSCOPY  [phone]   left and right   LUMBAR LAMINECTOMY/DECOMPRESSION MICRODISCECTOMY Left 09/29/2017   Procedure: LEFT LUMBAR TWO - LUMBAR THREELAMINOTOMY/MICRODISCECTOMY;  Surgeon: Shirlean Kelly, MD;  Location: [facility] OR;  Service: Neurosurgery;  Laterality: Left;  LEFT LUMBAR 2- LUMBAR 3 LAMINOTOMY/MICRODISCECTOMY   NASAL SINUS SURGERY  05/2015   RIGHT/LEFT HEART CATH AND CORONARY ANGIOGRAPHY N/A 04/10/2020   Procedure: RIGHT/LEFT HEART CATH AND CORONARY ANGIOGRAPHY;  Surgeon: Lennette Bihari, MD;  Location: [facility];  Service: Cardiovascular;  Laterality: N/A;   TEE WITHOUT CARDIOVERSION N/A 05/01/2020   Procedure: TRANSESOPHAGEAL ECHOCARDIOGRAM (TEE);  Surgeon: Donata Clay, Theron Arista, MD;  Location: [facility] OR;  Service: Open Heart Surgery;  Laterality: N/A;   TOTAL KNEE ARTHROPLASTY Right 06/24/2016   Procedure: TOTAL KNEE ARTHROPLASTY;  Surgeon: Valeria Batman, MD;  Location: [facility] OR;  Service: Orthopedics;  Laterality: Right;   TOTAL KNEE ARTHROPLASTY Left 12/18/2020   Procedure: LEFT TOTAL KNEE ARTHROPLASTY;  Surgeon: Valeria Batman, MD;  Location: [facility];  Service: Orthopedics;  Laterality: Left;   TRANSFORAMINAL LUMBAR INTERBODY FUSION W/ MIS 2 LEVEL Left 04/15/2022   Procedure: Minimally Invasive Surgery Decompression and Transforaminal Lumbar Interbody Fusion ,  Left , Lumbar three-four, Lumbar four-five;  Surgeon: Dawley, Alan Mulder, DO;  Location: [facility] OR;  Service: Neurosurgery;  Laterality: Left;   TRIGGER FINGER RELEASE Left 11/10/2013   Procedure: LEFT INDEX A-1 PULLEY RELEASE;  Surgeon: Wyn Forster, MD;  Location: [facility];  Service: Orthopedics;  Laterality: Left;   ULNAR NERVE TRANSPOSITION Left 11/10/2013   Procedure: ULNAR NERVE TRANSPOSITION;  Surgeon: Wyn Forster, MD;  Location: [facility];  Service: Orthopedics;  Laterality: Left;   Patient Active Problem List   Diagnosis Date Noted   Scapholunate advanced collapse of left wrist 03/13/2023   Arthritis of carpometacarpal [facility]) joint of left thumb 03/13/2023   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) 09/22/2022   Encounter for general adult medical examination with abnormal findings 09/22/2022   Encounter for screening for HIV 09/18/2022   Need for hepatitis C screening test 09/18/2022   Numbness in feet 09/18/2022   Tachycardia 09/18/2022   Dyslipidemia, goal LDL below 70 09/18/2022   Idiopathic small fiber peripheral neuropathy 09/18/2022   Chronic hyperglycemia 09/18/2022   Lumbar spinal stenosis 04/15/2022   Chronic pain of left thumb 02/25/2022   SBE (subacute bacterial endocarditis) prophylaxis candidate 12/16/2021   Cervical radiculopathy 08/13/2021   Ulnar neuropathy 08/13/2021   Screen for colon cancer 06/06/2021   Chronic tachycardia 06/04/2021   Candidal balanitis 06/04/2021   Carpal tunnel syndrome, bilateral upper limbs 09/26/2020   Cubital tunnel syndrome on left 08/23/2020   Pulmonary nodule 1 cm or greater in diameter 04/26/2020   Statin intolerance 04/20/2020   Coronary atherosclerosis of native coronary artery 04/18/2020   Nonrheumatic aortic valve stenosis    Bicuspid aortic valve 04/06/2020   Primary osteoarthritis of left knee 11/08/2019   Bilateral primary osteoarthritis of knee 09/28/2019   HNP (herniated nucleus pulposus), lumbar 09/29/2017   Pain in left wrist 10/09/2016   Unilateral primary osteoarthritis, right knee 06/24/2016   Primary hypertension 01/18/2007    ONSET DATE: DOS 03/13/23  REFERRING DIAG: A21.308 (ICD-10-CM) - [place] (scapholunate advanced collapse) of wrist, left   THERAPY DIAG:  Pain in left wrist  Stiffness of left wrist, not elsewhere classified  Muscle weakness (generalized)  Localized edema  Other lack of coordination  Rationale for  Evaluation and Treatment: Rehabilitation  PERTINENT HISTORY: Was seen in hand therapy in 2023 for thumb fx as well as trigger finger, but he only needed to attended 2 sessions.   He has a long history of arthritis in both hands and wrists and he states that he was unable to make a complete fist even before the surgery.  He also states having some pain now postop, just had a cast removed today and that his stitches were taken out 2 to 3 weeks ago.  He is currently has Steri-Strips on.  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  He states some swelling lingering and orthosis pushing on him around the thumb as well as the edema sleeve.      PAIN:  Are you having pain? Yes: NPRS scale: 3-4/10, mainly stiff Pain location: Lt wrist Pain description: aching, worse at night Aggravating factors: Intermittent swelling and any attempted weightbearing Relieving factors: Rest, elevation  PATIENT  GOALS: Improve the use of his left nondominant side  NEXT MD VISIT: 05/18/2023   OBJECTIVE: (All objective assessments below are from initial evaluation on: 04/20/23 unless otherwise specified.)    HAND DOMINANCE: Right   ADLs: Overall ADLs: States decreased ability to grab, hold household objects, pain and difficulty to open containers, perform FMS tasks (manipulate fasteners on clothing), mild to moderate bathing problems as well.    FUNCTIONAL OUTCOME MEASURES: 04/22/23:  Quick DASH 35% impairment today  (Higher % Score  =  More Impairment)     UPPER EXTREMITY ROM     Shoulder to Wrist AROM Left eval Lt 04/22/23 Lt 04/27/23  Forearm supination 68 73   Forearm pronation  75 72   Wrist flexion 21 16 20   Wrist extension 11 13 15   Wrist  ulnar deviation 7  17  Wrist radial deviation 5  (-1)  Functional dart thrower's motion (F-DTM) in ulnar flexion     F-DTM in radial extension      (Blank rows = not tested)   Hand AROM Left eval Lt 04/27/23  Full Fist Ability (or Gap to Distal Palmar Crease) 4.9cm gap from tip of MF to [facility] 3.3cm cm gap from tip MF to [facility]  Thumb Opposition  (Kapandji Scale)  4/10 4/10  (Blank rows = not tested)   UPPER EXTREMITY MMT:    Eval:  NT at eval due to recent and still healing injuries. Will be tested when appropriate.   MMT Left TBD  Elbow flexion   Elbow extension   Forearm supination   Forearm pronation   Wrist flexion   Wrist extension   Wrist ulnar deviation   Wrist radial deviation   (Blank rows = not tested)  HAND FUNCTION: Eval: Observed weakness in affected Lt hand.  Details TBD when safe Grip strength Right: TBD lbs, Left: TBD lbs   COORDINATION: 04/22/23: Box and Blocks Test: 28 Blocks today (54 is WFL)  Eval: Observed coordination impairments with affected Lt hand.  Details TBD when able   SENSATION: Eval:  States light touch intact today, even into thumb dorsum and around sx area, though diminished somewhat around sx area    EDEMA:   04/27/23: edema today: 20cm circumferentially around distal Lt wrist crease (compared to 16.9cm Rt side)   OBSERVATIONS:   Eval: He has an apparent ulnar sagging in the wrist and difficulty with radial deviation.  The surgical area is covered by Steri-Strips and looks to be crusted with some dried blood but otherwise healing well and no significant cardinal signs of infection.  Skin is dry.  He is unable to make a full fist or oppose to his middle finger.    TODAY'S TREATMENT:  04/27/23: New active range of motion measures today were taken which show improvement in the wrist overall, but a continuation of an "ulnar sag" they got a little worse.  OT reviews his home exercise program with him while he is on moist heat for 3 minutes,  tolerating that well and stating decrease in pain.  OT does add a new "spatula stretch" today for radial and ulnar deviation to be done as tolerated.  This can help with the "ulnar sagging."  Additionally OT does manual therapy IASTM along the dorsum and volar sides of his forearm and through the wrist and hand to reduce edema, decrease irritable tissue and the fascia and soft tissues.  He tolerates this well with a concurrent stretch.  Afterwards, OT also applies Kinesiotape to  support his wrist and radial deviation and also to help reduce edema through the back of the hand.  Finally, at the end of the session OT describes weaning from his orthosis during the day for light activity or just resting periods and only wearing it when he is out of the home, in crowds, tempted to do any weightbearing, etc.  Additionally he was given a compressive strap to wear around his wrist when he weans from his orthosis to give him some support.  He states this does feel supportive and that he will use it as needed.  We talked at length about protecting himself and not "overdoing things." (Self-care, etc.)  His stretches should be very light but very long, and he should not be doing painful things or things that make him swell worse.  Continue on He leaves with no increase in pain   Exercises - Forearm Supination Stretch  - 3-4 x daily - 3-5 reps - 15 sec hold - Forearm Pronation Stretch  - 3-4 x daily - 3-5 reps - 15 sec hold - Wrist Flexion Stretch  - 4 x daily - 3-5 reps - 15 sec hold - Wrist Extension Stretch Pronated  - 4 x daily - 3-5 reps - 15 hold - SPATULA stretch   - 4-6 x daily - 3-5 reps - 15 sec hold - BACK KNUCKLE STRETCHES   - 4 x daily - 3-5 reps - 15 sec hold - HOOK Stretch  - 4 x daily - 3-5 reps - 15-20 sec hold - Seated Finger Composite Flexion Stretch  - 4 x daily - 3-5 reps - 15 hold - PUSH KNUCKLES DOWN  - 4 x daily - 3-5 reps - 15 seconds hold - Bend and Pull Back Wrist SLOWLY  - 4 x daily -  10-15 reps - "Windshield Wipers"   - 4 x daily - 10-15 reps - Tendon Glides  - 4-6 x daily - 3-5 reps - 2-3 seconds hold    PATIENT EDUCATION: Education details: See tx section above for details  Person educated: Patient Education method: Engineer, structural, Teach back, Handouts  Education comprehension: States and demonstrates understanding, Additional Education required    HOME EXERCISE PROGRAM: Access Code: [email or web address] URL: [email or web address] Date: 04/20/2023 Prepared by: Fannie Knee   GOALS: Goals reviewed with patient? Yes   SHORT TERM GOALS: (STG required if POC>30 days) Target Date: 05/15/23  Pt will obtain protective, custom orthotic. Goal status: 04/20/23 MET   2.  Pt will demo/state understanding of initial HEP to improve pain levels and prerequisite motion. Goal status: 04/22/23: MET   LONG TERM GOALS: Target Date: 06/05/23  Pt will improve functional ability by decreased impairment per Quick DASH assessment from 35% to 20% or better, for better quality of life. Goal status: INITIAL  2.  Pt will improve grip strength in Lt hand from unsafe to test to at least 40lbs for functional use at home and in IADLs. Goal status: INITIAL  3.  Pt will improve A/ROM in Lt wrist flex/ext from 21/11 to at least 30/25* to have better functional motion for tasks like reach and grasp.  Goal status: INITIAL  4.  Pt will improve strength in left forearm and wrist from unsafe to test  to at least 4+/5 MMT to have increased functional ability to carry out selfcare and higher-level homecare tasks with less difficulty. Goal status: INITIAL  5.  Pt will improve coordination skills in left arm, as seen by within functional limit  score on box and blocks testing to have increased functional ability to carry out fine motor tasks (fasteners, etc.) and more complex, coordinated IADLs (meal prep, sports, etc.).  Goal status: INITIAL  6.  Pt will decrease pain at rest from  5-6/10 to 1/10 or better to have better sleep and occupational participation in daily roles. Goal status: INITIAL'  ASSESSMENT:  CLINICAL IMPRESSION: 04/27/23: Swelling is still significant, and ulnar sagging has gotten little worse.    04/22/23: He is tolerating light stretches well, still struggling with some swelling and healing.  OT encourages him to not ever forced motion and that the typical outcomes for this are about 35 degrees of wrist flexion and 35 degrees of wrist extension (or 50% of the opposite side).  Carry on   PLAN:  OT FREQUENCY: 1-2x/week  OT DURATION: 6 weeks through 06/05/23 as needed  PLANNED INTERVENTIONS: 97168 OT Re-evaluation, 97535 self care/ADL training, 11914 therapeutic exercise, 97530 therapeutic activity, 97112 neuromuscular re-education, 97140 manual therapy, 97035 ultrasound, 97039 fluidotherapy, 97010 moist heat, 97010 cryotherapy, 97034 contrast bath, 97760 Orthotics management and training, 78295 Splinting (initial encounter), [id number] Subsequent splinting/medication, scar mobilization, compression bandaging, Dry needling, energy conservation, coping strategies training, patient/family education, and DME and/or AE instructions  CONSULTED AND AGREED WITH PLAN OF CARE: Patient  PLAN FOR NEXT SESSION:   Continue weaning from orthosis and start strengthening at 8 weeks postop.  Continue to monitor ulnar sagging of the wrist and swelling   Fannie Knee, OTR/L, CHT 04/27/2023, 4:26 PM

## 2023-04-27 ENCOUNTER — Ambulatory Visit: Payer: Medicare Other | Admitting: Rehabilitative and Restorative Service Providers"

## 2023-04-27 ENCOUNTER — Encounter: Payer: Self-pay | Admitting: Rehabilitative and Restorative Service Providers"

## 2023-04-27 DIAGNOSIS — M25632 Stiffness of left wrist, not elsewhere classified: Secondary | ICD-10-CM

## 2023-04-27 DIAGNOSIS — R278 Other lack of coordination: Secondary | ICD-10-CM

## 2023-04-27 DIAGNOSIS — M25532 Pain in left wrist: Secondary | ICD-10-CM

## 2023-04-27 DIAGNOSIS — R600 Localized edema: Secondary | ICD-10-CM | POA: Diagnosis not present

## 2023-04-27 DIAGNOSIS — M6281 Muscle weakness (generalized): Secondary | ICD-10-CM | POA: Diagnosis not present

## 2023-04-28 NOTE — Therapy (Signed)
OUTPATIENT OCCUPATIONAL THERAPY TREATMENT NOTE  Patient Name: Philip Winters MRN: [id number] DOB:June 09, 1960, 62 y.o., male Today's Date: 04/29/2023  PCP: Sanda Linger, MD REFERRING PROVIDER:  Samuella Cota, MD    END OF SESSION:  OT End of Session - 04/29/23 1020     Visit Number 4    Number of Visits 8    Date for OT Re-Evaluation 06/05/23    Authorization Type BCBS Medicare    OT Start Time 1020    OT Stop Time 1103    OT Time Calculation (min) 43 min    Activity Tolerance Patient tolerated treatment well;No increased pain;Patient limited by fatigue;Patient limited by pain    Behavior During Therapy [facility] for tasks assessed/performed             Past Medical History:  Diagnosis Date   Allergy    Arthritis    CAD (coronary artery disease)    GERD (gastroesophageal reflux disease)    occ   Heart murmur    baby   High cholesterol    Hypertension    Peripheral neuropathy    Pneumonia    Right carotid bruit 06/10/2017   Sciatica    Seasonal allergies    Sinus tachycardia    Spinal stenosis    Past Surgical History:  Procedure Laterality Date   CARDIAC CATHETERIZATION     CARPAL TUNNEL WITH CUBITAL TUNNEL Left 11/10/2013   Procedure: LEFT CARPAL TUNNEL RELEASE;  Surgeon: Wyn Forster, MD;  Location: [facility];  Service: Orthopedics;  Laterality: Left;   CARPECTOMY Left 03/13/2023   Procedure: LEFT WRIST PROXIMAL ROW CARPECTOMY WITH PLACEMENT ALLOGRAFT SPACER / THUMB CMC DENERVATION;  Surgeon: Samuella Cota, MD;  Location: [facility];  Service: Orthopedics;  Laterality: Left;   COLONOSCOPY  2023   CORONARY ARTERY BYPASS GRAFT N/A 05/01/2020   Procedure: CORONARY ARTERY BYPASS GRAFTING (CABG) X 2 ON CARDIOPULMONARY BYPASS. LIMA TO LAD, SVG TO DIAG;  Surgeon: Kerin Perna, MD;  Location: [facility] OR;  Service: Open Heart Surgery;  Laterality: N/A;   ENDOVEIN HARVEST OF GREATER SAPHENOUS VEIN Right 05/01/2020   Procedure:  ENDOVEIN HARVEST OF GREATER SAPHENOUS VEIN;  Surgeon: Kerin Perna, MD;  Location: [facility] OR;  Service: Open Heart Surgery;  Laterality: Right;   KNEE ARTHROSCOPY  [id number]   left and right   LUMBAR LAMINECTOMY/DECOMPRESSION MICRODISCECTOMY Left 09/29/2017   Procedure: LEFT LUMBAR TWO - LUMBAR THREELAMINOTOMY/MICRODISCECTOMY;  Surgeon: Shirlean Kelly, MD;  Location: [facility] OR;  Service: Neurosurgery;  Laterality: Left;  LEFT LUMBAR 2- LUMBAR 3 LAMINOTOMY/MICRODISCECTOMY   NASAL SINUS SURGERY  05/2015   RIGHT/LEFT HEART CATH AND CORONARY ANGIOGRAPHY N/A 04/10/2020   Procedure: RIGHT/LEFT HEART CATH AND CORONARY ANGIOGRAPHY;  Surgeon: Lennette Bihari, MD;  Location: [facility];  Service: Cardiovascular;  Laterality: N/A;   TEE WITHOUT CARDIOVERSION N/A 05/01/2020   Procedure: TRANSESOPHAGEAL ECHOCARDIOGRAM (TEE);  Surgeon: Donata Clay, Theron Arista, MD;  Location: [facility] OR;  Service: Open Heart Surgery;  Laterality: N/A;   TOTAL KNEE ARTHROPLASTY Right 06/24/2016   Procedure: TOTAL KNEE ARTHROPLASTY;  Surgeon: Valeria Batman, MD;  Location: [facility] OR;  Service: Orthopedics;  Laterality: Right;   TOTAL KNEE ARTHROPLASTY Left 12/18/2020   Procedure: LEFT TOTAL KNEE ARTHROPLASTY;  Surgeon: Valeria Batman, MD;  Location: [facility];  Service: Orthopedics;  Laterality: Left;   TRANSFORAMINAL LUMBAR INTERBODY FUSION W/ MIS 2 LEVEL Left 04/15/2022   Procedure: Minimally Invasive Surgery Decompression and Transforaminal Lumbar Interbody  Fusion , Left , Lumbar three-four, Lumbar four-five;  Surgeon: Dawley, Alan Mulder, DO;  Location: [facility] OR;  Service: Neurosurgery;  Laterality: Left;   TRIGGER FINGER RELEASE Left 11/10/2013   Procedure: LEFT INDEX A-1 PULLEY RELEASE;  Surgeon: Wyn Forster, MD;  Location: [facility];  Service: Orthopedics;  Laterality: Left;   ULNAR NERVE TRANSPOSITION Left 11/10/2013   Procedure: ULNAR NERVE TRANSPOSITION;  Surgeon: Wyn Forster, MD;  Location: [facility];  Service: Orthopedics;  Laterality: Left;   Patient Active Problem List   Diagnosis Date Noted   Scapholunate advanced collapse of left wrist 03/13/2023   Arthritis of carpometacarpal [facility]) joint of left thumb 03/13/2023   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) 09/22/2022   Encounter for general adult medical examination with abnormal findings 09/22/2022   Encounter for screening for HIV 09/18/2022   Need for hepatitis C screening test 09/18/2022   Numbness in feet 09/18/2022   Tachycardia 09/18/2022   Dyslipidemia, goal LDL below 70 09/18/2022   Idiopathic small fiber peripheral neuropathy 09/18/2022   Chronic hyperglycemia 09/18/2022   Lumbar spinal stenosis 04/15/2022   Chronic pain of left thumb 02/25/2022   SBE (subacute bacterial endocarditis) prophylaxis candidate 12/16/2021   Cervical radiculopathy 08/13/2021   Ulnar neuropathy 08/13/2021   Screen for colon cancer 06/06/2021   Chronic tachycardia 06/04/2021   Candidal balanitis 06/04/2021   Carpal tunnel syndrome, bilateral upper limbs 09/26/2020   Cubital tunnel syndrome on left 08/23/2020   Pulmonary nodule 1 cm or greater in diameter 04/26/2020   Statin intolerance 04/20/2020   Coronary atherosclerosis of native coronary artery 04/18/2020   Nonrheumatic aortic valve stenosis    Bicuspid aortic valve 04/06/2020   Primary osteoarthritis of left knee 11/08/2019   Bilateral primary osteoarthritis of knee 09/28/2019   HNP (herniated nucleus pulposus), lumbar 09/29/2017   Pain in left wrist 10/09/2016   Unilateral primary osteoarthritis, right knee 06/24/2016   Primary hypertension 01/18/2007    ONSET DATE: DOS 03/13/23  REFERRING DIAG: A41.660 (ICD-10-CM) - [place] (scapholunate advanced collapse) of wrist, left   THERAPY DIAG:  Pain in left wrist  Stiffness of left wrist, not elsewhere classified  Muscle weakness (generalized)  Localized edema  Other lack of  coordination  Rationale for Evaluation and Treatment: Rehabilitation  PERTINENT HISTORY: Was seen in hand therapy in 2023 for thumb fx as well as trigger finger, but he only needed to attended 2 sessions.   He has a long history of arthritis in both hands and wrists and he states that he was unable to make a complete fist even before the surgery.  He also states having some pain now postop, just had a cast removed today and that his stitches were taken out 2 to 3 weeks ago.  He is currently has Steri-Strips on.  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  He states still having some pain, and unfortunately the Kinesiotape did make him get her small red rash near his dorsal surgical area.  He removed it and he is doing okay.     PAIN:  Are you having pain? Yes: NPRS scale: 5-6/10, mainly stiff Pain location: Lt wrist Pain description: aching, worse at night  Aggravating factors: Intermittent swelling and any attempted weightbearing Relieving factors: Rest, elevation  PATIENT GOALS: Improve the use of his left nondominant side  NEXT MD VISIT: 05/18/2023   OBJECTIVE: (All objective assessments below are from initial evaluation on: 04/20/23 unless otherwise specified.)    HAND DOMINANCE: Right   ADLs: Overall ADLs: States decreased ability to grab, hold household objects, pain and difficulty to open containers, perform FMS tasks (manipulate fasteners on clothing), mild to moderate bathing problems as well.    FUNCTIONAL OUTCOME MEASURES: 04/22/23:  Quick DASH 35% impairment today  (Higher % Score  =  More Impairment)     UPPER EXTREMITY ROM     Shoulder to Wrist AROM Left eval Lt 04/22/23 Lt 04/27/23 Lt 04/29/23  Forearm  supination 68 73    Forearm pronation  75 72    Wrist flexion 21 16 20 21   Wrist extension 11 13 15 11   Wrist ulnar deviation 7  17 11   Wrist radial deviation 5  (-1) 0  Functional dart thrower's motion (F-DTM) in ulnar flexion      F-DTM in radial extension       (Blank rows = not tested)   Hand AROM Left eval Lt 04/27/23 Lt 04/29/23  Full Fist Ability (or Gap to Distal Palmar Crease) 4.9cm gap from tip of MF to [facility] 3.3cm cm gap from tip MF to [facility] 3cm gap from tip MF to [facility]  Thumb Opposition  (Kapandji Scale)  4/10 4/10   (Blank rows = not tested)   UPPER EXTREMITY MMT:    Eval:  NT at eval due to recent and still healing injuries. Will be tested when appropriate.   MMT Left TBD  Elbow flexion   Elbow extension   Forearm supination   Forearm pronation   Wrist flexion   Wrist extension   Wrist ulnar deviation   Wrist radial deviation   (Blank rows = not tested)  HAND FUNCTION: 04/29/23: Grip strength Right: 61 lbs, Left: 11 lbs   Eval: Observed weakness in affected Lt hand.  Details TBD when safe   COORDINATION: 04/22/23: Box and Blocks Test: 28 Blocks today (54 is WFL)  Eval: Observed coordination impairments with affected Lt hand.  Details TBD when able   SENSATION: Eval:  States light touch intact today, even into thumb dorsum and around sx area, though diminished somewhat around sx area    EDEMA:   04/27/23: edema today: 20cm circumferentially around distal Lt wrist crease (compared to 16.9cm Rt side)   OBSERVATIONS:   Eval: He has an apparent ulnar sagging in the wrist and difficulty with radial deviation.  The surgical area is covered by Steri-Strips and looks to be crusted with some dried blood but otherwise healing well and no significant cardinal signs of infection.  Skin is dry.  He is unable to make a full fist or oppose to his middle finger.    TODAY'S TREATMENT:  04/29/23: He performs active range of motion for exercise as well as new measures  which shows unfortunate stiffness increasing in the wrist which seems to be increasing as well as his pain is increasing.  OT educates to be wearing some kind of brace or support, slow down if swollen sore and painful, and not to be lifting pushing or pulling anything too heavy or painful now.  As he is about 7 weeks postop, OT starts him on light grip training today as well as rubber band and therapy putty activities which is nonpainful  to him, but he does feel some active stretching to the dorsum of his hand with squeezes.  He was advised to do these things only 2-3 times a day, gently, take days off if sore, and to be warmed up and stretch out first.  New strengthening activities bolded below.  Lastly, to help lower his pain, OT does manual therapy IASTM with light concurrent stretches, myofascial release to the dorsum of the hand, gentle retrograde massages.  He states feeling some relief of pain and pressure at the end of the session.   Exercises - Forearm Supination Stretch  - 3-4 x daily - 3-5 reps - 15 sec hold - Forearm Pronation Stretch  - 3-4 x daily - 3-5 reps - 15 sec hold - Wrist Flexion Stretch  - 4 x daily - 3-5 reps - 15 sec hold - Wrist Extension Stretch Pronated  - 4 x daily - 3-5 reps - 15 hold - SPATULA stretch   - 4-6 x daily - 3-5 reps - 15 sec hold - BACK KNUCKLE STRETCHES   - 4 x daily - 3-5 reps - 15 sec hold - HOOK Stretch  - 4 x daily - 3-5 reps - 15-20 sec hold - Seated Finger Composite Flexion Stretch  - 4 x daily - 3-5 reps - 15 hold - PUSH KNUCKLES DOWN  - 4 x daily - 3-5 reps - 15 seconds hold - Bend and Pull Back Wrist SLOWLY  - 4 x daily - 10-15 reps - "Windshield Wipers"   - 4 x daily - 10-15 reps - Tendon Glides  - 4-6 x daily - 3-5 reps - 2-3 seconds hold - Full Fist  - 2-3 x daily - 3-5 x weekly - 5 reps - 10 sec hold - Thumb Opposition with Putty  - 2-3 x daily - 3-5 x weekly - 5 reps - Finger Extension with Putty  - 4-6 x daily - 1 sets - 10 reps - 5 sec  hold    PATIENT EDUCATION: Education details: See tx section above for details  Person educated: Patient Education method: Engineer, structural, Teach back, Handouts  Education comprehension: States and demonstrates understanding, Additional Education required    HOME EXERCISE PROGRAM: Access Code: [email or web address] URL: [email or web address] Date: 04/20/2023 Prepared by: Fannie Knee   GOALS: Goals reviewed with patient? Yes   SHORT TERM GOALS: (STG required if POC>30 days) Target Date: 05/15/23  Pt will obtain protective, custom orthotic. Goal status: 04/20/23 MET   2.  Pt will demo/state understanding of initial HEP to improve pain levels and prerequisite motion. Goal status: 04/22/23: MET   LONG TERM GOALS: Target Date: 06/05/23  Pt will improve functional ability by decreased impairment per Quick DASH assessment from 35% to 20% or better, for better quality of life. Goal status: INITIAL  2.  Pt will improve grip strength in Lt hand from unsafe to test to at least 40lbs for functional use at home and in IADLs. Goal status: INITIAL  3.  Pt will improve A/ROM in Lt wrist flex/ext from 21/11 to at least 30/25* to have better functional motion for tasks like reach and grasp.  Goal status: INITIAL  4.  Pt will improve strength in left forearm and wrist from unsafe to test  to at least 4+/5 MMT to have increased functional ability to carry out selfcare and higher-level homecare tasks with less difficulty. Goal status: INITIAL  5.  Pt will improve coordination skills in left arm, as seen by within functional  limit score on box and blocks testing to have increased functional ability to carry out fine motor tasks (fasteners, etc.) and more complex, coordinated IADLs (meal prep, sports, etc.).  Goal status: INITIAL  6.  Pt will decrease pain at rest from 5-6/10 to 1/10 or better to have better sleep and occupational participation in daily roles. Goal status:  INITIAL  ASSESSMENT:  CLINICAL IMPRESSION: 04/29/23: He is stiff, swollen, painful which is somewhat increased recently.  OT highly encourages him to rest more and watch his pain levels, use modalities as needed.  OT feels that he is pushing himself a little too hard right now and he should be focused on healing and very gentle mobilization and now a light grip training.    PLAN:  OT FREQUENCY: 1-2x/week  OT DURATION: 6 weeks through 06/05/23 as needed  PLANNED INTERVENTIONS: 97168 OT Re-evaluation, 97535 self care/ADL training, 69629 therapeutic exercise, 97530 therapeutic activity, 97112 neuromuscular re-education, 97140 manual therapy, 97035 ultrasound, 97039 fluidotherapy, 97010 moist heat, 97010 cryotherapy, 97034 contrast bath, 97760 Orthotics management and training, 52841 Splinting (initial encounter), [id number] Subsequent splinting/medication, scar mobilization, compression bandaging, Dry needling, energy conservation, coping strategies training, patient/family education, and DME and/or AE instructions  CONSULTED AND AGREED WITH PLAN OF CARE: Patient  PLAN FOR NEXT SESSION:   Check new grip strength activities, continue weaning from orthosis and start strengthening at 8 weeks postop.  Continue to monitor ulnar sagging of the wrist and swelling  Fannie Knee, OTR/L, CHT 04/29/2023, 2:03 PM

## 2023-04-29 ENCOUNTER — Encounter: Payer: Self-pay | Admitting: Rehabilitative and Restorative Service Providers"

## 2023-04-29 ENCOUNTER — Ambulatory Visit: Payer: Medicare Other | Admitting: Rehabilitative and Restorative Service Providers"

## 2023-04-29 DIAGNOSIS — M25632 Stiffness of left wrist, not elsewhere classified: Secondary | ICD-10-CM | POA: Diagnosis not present

## 2023-04-29 DIAGNOSIS — M25532 Pain in left wrist: Secondary | ICD-10-CM | POA: Diagnosis not present

## 2023-04-29 DIAGNOSIS — R600 Localized edema: Secondary | ICD-10-CM

## 2023-04-29 DIAGNOSIS — R278 Other lack of coordination: Secondary | ICD-10-CM

## 2023-04-29 DIAGNOSIS — M6281 Muscle weakness (generalized): Secondary | ICD-10-CM | POA: Diagnosis not present

## 2023-05-05 ENCOUNTER — Other Ambulatory Visit: Payer: Self-pay | Admitting: Cardiology

## 2023-05-05 ENCOUNTER — Encounter (HOSPITAL_COMMUNITY): Payer: Self-pay

## 2023-05-05 ENCOUNTER — Other Ambulatory Visit (HOSPITAL_COMMUNITY): Payer: Self-pay

## 2023-05-05 DIAGNOSIS — I350 Nonrheumatic aortic (valve) stenosis: Secondary | ICD-10-CM

## 2023-05-05 DIAGNOSIS — Z789 Other specified health status: Secondary | ICD-10-CM

## 2023-05-05 DIAGNOSIS — I25110 Atherosclerotic heart disease of native coronary artery with unstable angina pectoris: Secondary | ICD-10-CM

## 2023-05-05 DIAGNOSIS — I2510 Atherosclerotic heart disease of native coronary artery without angina pectoris: Secondary | ICD-10-CM

## 2023-05-05 DIAGNOSIS — I700 Atherosclerosis of aorta: Secondary | ICD-10-CM

## 2023-05-05 MED ORDER — PRALUENT 75 MG/ML ~~LOC~~ SOAJ
75.0000 mg | SUBCUTANEOUS | 11 refills | Status: DC
  Filled 2023-05-05 – 2023-05-07 (×2): qty 2, 28d supply, fill #0

## 2023-05-05 NOTE — Therapy (Signed)
OUTPATIENT OCCUPATIONAL THERAPY TREATMENT NOTE  Patient Name: Philip Winters MRN: [id number] DOB:June 19, 1960, 62 y.o., male Today's Date: 05/06/2023  PCP: Sanda Linger, MD REFERRING PROVIDER:  Samuella Cota, MD    END OF SESSION:  OT End of Session - 05/06/23 0841     Visit Number 5    Number of Visits 8    Date for OT Re-Evaluation 06/05/23    Authorization Type BCBS Medicare    OT Start Time [phone]    OT Stop Time 0930    OT Time Calculation (min) 47 min    Activity Tolerance Patient tolerated treatment well;No increased pain;Patient limited by fatigue;Patient limited by pain    Behavior During Therapy [facility] for tasks assessed/performed              Past Medical History:  Diagnosis Date   Allergy    Arthritis    CAD (coronary artery disease)    GERD (gastroesophageal reflux disease)    occ   Heart murmur    baby   High cholesterol    Hypertension    Peripheral neuropathy    Pneumonia    Right carotid bruit 06/10/2017   Sciatica    Seasonal allergies    Sinus tachycardia    Spinal stenosis    Past Surgical History:  Procedure Laterality Date   CARDIAC CATHETERIZATION     CARPAL TUNNEL WITH CUBITAL TUNNEL Left 11/10/2013   Procedure: LEFT CARPAL TUNNEL RELEASE;  Surgeon: Wyn Forster, MD;  Location: [facility];  Service: Orthopedics;  Laterality: Left;   CARPECTOMY Left 03/13/2023   Procedure: LEFT WRIST PROXIMAL ROW CARPECTOMY WITH PLACEMENT ALLOGRAFT SPACER / THUMB CMC DENERVATION;  Surgeon: Samuella Cota, MD;  Location: [facility];  Service: Orthopedics;  Laterality: Left;   COLONOSCOPY  2023   CORONARY ARTERY BYPASS GRAFT N/A 05/01/2020   Procedure: CORONARY ARTERY BYPASS GRAFTING (CABG) X 2 ON CARDIOPULMONARY BYPASS. LIMA TO LAD, SVG TO DIAG;  Surgeon: Kerin Perna, MD;  Location: [facility] OR;  Service: Open Heart Surgery;  Laterality: N/A;   ENDOVEIN HARVEST OF GREATER SAPHENOUS VEIN Right 05/01/2020   Procedure:  ENDOVEIN HARVEST OF GREATER SAPHENOUS VEIN;  Surgeon: Kerin Perna, MD;  Location: [facility];  Service: Open Heart Surgery;  Laterality: Right;   KNEE ARTHROSCOPY  [phone]   left and right   LUMBAR LAMINECTOMY/DECOMPRESSION MICRODISCECTOMY Left 09/29/2017   Procedure: LEFT LUMBAR TWO - LUMBAR THREELAMINOTOMY/MICRODISCECTOMY;  Surgeon: Shirlean Kelly, MD;  Location: [facility] OR;  Service: Neurosurgery;  Laterality: Left;  LEFT LUMBAR 2- LUMBAR 3 LAMINOTOMY/MICRODISCECTOMY   NASAL SINUS SURGERY  05/2015   RIGHT/LEFT HEART CATH AND CORONARY ANGIOGRAPHY N/A 04/10/2020   Procedure: RIGHT/LEFT HEART CATH AND CORONARY ANGIOGRAPHY;  Surgeon: Lennette Bihari, MD;  Location: [facility];  Service: Cardiovascular;  Laterality: N/A;   TEE WITHOUT CARDIOVERSION N/A 05/01/2020   Procedure: TRANSESOPHAGEAL ECHOCARDIOGRAM (TEE);  Surgeon: Donata Clay, Theron Arista, MD;  Location: [facility] OR;  Service: Open Heart Surgery;  Laterality: N/A;   TOTAL KNEE ARTHROPLASTY Right 06/24/2016   Procedure: TOTAL KNEE ARTHROPLASTY;  Surgeon: Valeria Batman, MD;  Location: [facility] OR;  Service: Orthopedics;  Laterality: Right;   TOTAL KNEE ARTHROPLASTY Left 12/18/2020   Procedure: LEFT TOTAL KNEE ARTHROPLASTY;  Surgeon: Valeria Batman, MD;  Location: [facility];  Service: Orthopedics;  Laterality: Left;   TRANSFORAMINAL LUMBAR INTERBODY FUSION W/ MIS 2 LEVEL Left 04/15/2022   Procedure: Minimally Invasive Surgery Decompression and Transforaminal Lumbar  Interbody Fusion , Left , Lumbar three-four, Lumbar four-five;  Surgeon: Dawley, Alan Mulder, DO;  Location: [facility] OR;  Service: Neurosurgery;  Laterality: Left;   TRIGGER FINGER RELEASE Left 11/10/2013   Procedure: LEFT INDEX A-1 PULLEY RELEASE;  Surgeon: Wyn Forster, MD;  Location: [facility];  Service: Orthopedics;  Laterality: Left;   ULNAR NERVE TRANSPOSITION Left 11/10/2013   Procedure: ULNAR NERVE TRANSPOSITION;  Surgeon: Wyn Forster, MD;  Location: [facility];  Service: Orthopedics;  Laterality: Left;   Patient Active Problem List   Diagnosis Date Noted   Scapholunate advanced collapse of left wrist 03/13/2023   Arthritis of carpometacarpal [facility]) joint of left thumb 03/13/2023   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) 09/22/2022   Encounter for general adult medical examination with abnormal findings 09/22/2022   Encounter for screening for HIV 09/18/2022   Need for hepatitis C screening test 09/18/2022   Numbness in feet 09/18/2022   Tachycardia 09/18/2022   Dyslipidemia, goal LDL below 70 09/18/2022   Idiopathic small fiber peripheral neuropathy 09/18/2022   Chronic hyperglycemia 09/18/2022   Lumbar spinal stenosis 04/15/2022   Chronic pain of left thumb 02/25/2022   SBE (subacute bacterial endocarditis) prophylaxis candidate 12/16/2021   Cervical radiculopathy 08/13/2021   Ulnar neuropathy 08/13/2021   Screen for colon cancer 06/06/2021   Chronic tachycardia 06/04/2021   Candidal balanitis 06/04/2021   Carpal tunnel syndrome, bilateral upper limbs 09/26/2020   Cubital tunnel syndrome on left 08/23/2020   Pulmonary nodule 1 cm or greater in diameter 04/26/2020   Statin intolerance 04/20/2020   Coronary atherosclerosis of native coronary artery 04/18/2020   Nonrheumatic aortic valve stenosis    Bicuspid aortic valve 04/06/2020   Primary osteoarthritis of left knee 11/08/2019   Bilateral primary osteoarthritis of knee 09/28/2019   HNP (herniated nucleus pulposus), lumbar 09/29/2017   Pain in left wrist 10/09/2016   Unilateral primary osteoarthritis, right knee 06/24/2016   Primary hypertension 01/18/2007    ONSET DATE: DOS 03/13/23  REFERRING DIAG: H37.169 (ICD-10-CM) - [place] (scapholunate advanced collapse) of wrist, left   THERAPY DIAG:  Pain in left wrist  Stiffness of left wrist, not elsewhere classified  Localized edema  Muscle weakness (generalized)  Other lack of  coordination  Rationale for Evaluation and Treatment: Rehabilitation  PERTINENT HISTORY: Was seen in hand therapy in 2023 for thumb fx as well as trigger finger, but he only needed to attended 2 sessions.   He has a long history of arthritis in both hands and wrists and he states that he was unable to make a complete fist even before the surgery.  He also states having some pain now postop, just had a cast removed today and that his stitches were taken out 2 to 3 weeks ago.  He is currently has Steri-Strips on.  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  He states increase in pain, swelling, not wearing rigid orthosis at night or day. Not causing himself pain, but feeling a bit worse and frustrated.     PAIN:  Are you having pain? Yes: NPRS scale: 6-7/10, mainly stiff Pain location: Lt wrist Pain description: aching, worse at night Aggravating factors: Intermittent swelling and any attempted weightbearing Relieving  factors: Rest, elevation  PATIENT GOALS: Improve the use of his left nondominant side  NEXT MD VISIT: 05/18/2023   OBJECTIVE: (All objective assessments below are from initial evaluation on: 04/20/23 unless otherwise specified.)    HAND DOMINANCE: Right   ADLs: Overall ADLs: States decreased ability to grab, hold household objects, pain and difficulty to open containers, perform FMS tasks (manipulate fasteners on clothing), mild to moderate bathing problems as well.    FUNCTIONAL OUTCOME MEASURES: 04/22/23:  Quick DASH 35% impairment today  (Higher % Score  =  More Impairment)     UPPER EXTREMITY ROM     Shoulder to Wrist AROM Left eval Lt 04/22/23 Lt 04/27/23 Lt 04/29/23 Lt  05/06/23  Forearm supination  68 73     Forearm pronation  75 72     Wrist flexion 21 16 20 21 15  (up to 18* after manual therapy)  Wrist extension 11 13 15 11 20  (24* after DN)   Wrist ulnar deviation 7  17 11    Wrist radial deviation 5  (-1) 0   Functional dart thrower's motion (F-DTM) in ulnar flexion       F-DTM in radial extension        (Blank rows = not tested)   Hand AROM Left eval Lt 04/27/23 Lt 04/29/23 Lt  05/06/2023  Full Fist Ability (or Gap to Distal Palmar Crease) 4.9cm gap from tip of MF to [facility] 3.3cm cm gap from tip MF to [facility] 3cm gap from tip MF to [facility] 2.8 Centimeter gap from tip of middle finger to distal palmar crease  Thumb Opposition  (Kapandji Scale)  4/10 4/10    (Blank rows = not tested)   UPPER EXTREMITY MMT:    Eval:  NT at eval due to recent and still healing injuries. Will be tested when appropriate.   MMT Left TBD  Elbow flexion   Elbow extension   Forearm supination   Forearm pronation   Wrist flexion   Wrist extension   Wrist ulnar deviation   Wrist radial deviation   (Blank rows = not tested)  HAND FUNCTION: 05/06/2023: Grip strength left hand 10 pounds   04/29/23: Grip strength Right: 61 lbs, Left: 11 lbs   Eval: Observed weakness in affected Lt hand.  Details TBD when safe   COORDINATION: 04/22/23: Box and Blocks Test: 28 Blocks today (54 is WFL)  Eval: Observed coordination impairments with affected Lt hand.  Details TBD when able   SENSATION: Eval:  States light touch intact today, even into thumb dorsum and around sx area, though diminished somewhat around sx area    EDEMA:   05/08/23: 20.8cm circumferentially around distal Lt wrist crease (compared to 16.9cm Rt side)   04/27/23: edema today: 20cm circumferentially around distal Lt wrist crease (compared to 16.9cm Rt side)   OBSERVATIONS:   Eval: He has an apparent ulnar sagging in the wrist and difficulty with radial deviation.  The surgical area is covered by Steri-Strips and looks to be crusted with  some dried blood but otherwise healing well and no significant cardinal signs of infection.  Skin is dry.  He is unable to make a full fist or oppose to his middle finger.    TODAY'S TREATMENT:  05/06/2023:  He performs AROM, grip for exercises and performs worse today due to increased swelling and pain.  For safety, OT recommends to wear rigid orthosis at night and in day more often to rest more.  OT also recommends with holding  therapy putty for at least a week as when he squeezed today it was painful to the ulnar side of his wrist.  For self-care/safety he was told to rest more/do less repetitive or forceful activities, be more mindful of his body.  OT spends the rest of the session doing manual therapy: Retrograde massages, very light stretches to the wrist in all planes of motion, stretches to the fingers at the MP joints, the IP joints, and in composite flexion.   Additionally with his consent and permission after discussion of the possible benefits and negative effects, OT does manual therapy dry needling to the proximal muscles of the forearm to try to alleviate some stiffness or pain that he is feeling through his dorsal wrist.  OT uses a 0.25 x 30mm needle inserted only partly into the extensor digitorum communis muscle belly achieving several good twitch responses, no bruising or bleeding, no lingering pain, but he does get improved motion and wrist extension afterwards.  Generally his motion improves after manual therapy treatments today though there is no significant change in his swelling for now.  He does not have his rigid orthosis with him now, so he was assisted to wear his compressive wrist strap-though he was asked to have his orthosis on every night and when out of the home and most of the day now to help him rest as he is probably likely not giving his arm enough rest.        04/29/23: He performs active range of motion for exercise as well as new measures which shows unfortunate  stiffness increasing in the wrist which seems to be increasing as well as his pain is increasing.  OT educates to be wearing some kind of brace or support, slow down if swollen sore and painful, and not to be lifting pushing or pulling anything too heavy or painful now.  As he is about 7 weeks postop, OT starts him on light grip training today as well as rubber band and therapy putty activities which is nonpainful to him, but he does feel some active stretching to the dorsum of his hand with squeezes.  He was advised to do these things only 2-3 times a day, gently, take days off if sore, and to be warmed up and stretch out first.  New strengthening activities bolded below.  Lastly, to help lower his pain, OT does manual therapy IASTM with light concurrent stretches, myofascial release to the dorsum of the hand, gentle retrograde massages.  He states feeling some relief of pain and pressure at the end of the session.   Exercises - Forearm Supination Stretch  - 3-4 x daily - 3-5 reps - 15 sec hold - Forearm Pronation Stretch  - 3-4 x daily - 3-5 reps - 15 sec hold - Wrist Flexion Stretch  - 4 x daily - 3-5 reps - 15 sec hold - Wrist Extension Stretch Pronated  - 4 x daily - 3-5 reps - 15 hold - SPATULA stretch   - 4-6 x daily - 3-5 reps - 15 sec hold - BACK KNUCKLE STRETCHES   - 4 x daily - 3-5 reps - 15 sec hold - HOOK Stretch  - 4 x daily - 3-5 reps - 15-20 sec hold - Seated Finger Composite Flexion Stretch  - 4 x daily - 3-5 reps - 15 hold - PUSH KNUCKLES DOWN  - 4 x daily - 3-5 reps - 15 seconds hold - Bend and Pull Back Wrist SLOWLY  -  4 x daily - 10-15 reps - "Windshield Wipers"   - 4 x daily - 10-15 reps - Tendon Glides  - 4-6 x daily - 3-5 reps - 2-3 seconds hold - Full Fist  - 2-3 x daily - 3-5 x weekly - 5 reps - 10 sec hold - Thumb Opposition with Putty  - 2-3 x daily - 3-5 x weekly - 5 reps - Finger Extension with Putty  - 4-6 x daily - 1 sets - 10 reps - 5 sec hold    PATIENT  EDUCATION: Education details: See tx section above for details  Person educated: Patient Education method: Verbal Instruction, Teach back, Handouts  Education comprehension: States and demonstrates understanding, Additional Education required    HOME EXERCISE PROGRAM: Access Code: [email or web address] URL: [email or web address] Date: 04/20/2023 Prepared by: Fannie Knee   GOALS: Goals reviewed with patient? Yes   SHORT TERM GOALS: (STG required if POC>30 days) Target Date: 05/15/23  Pt will obtain protective, custom orthotic. Goal status: 04/20/23 MET   2.  Pt will demo/state understanding of initial HEP to improve pain levels and prerequisite motion. Goal status: 04/22/23: MET   LONG TERM GOALS: Target Date: 06/05/23  Pt will improve functional ability by decreased impairment per Quick DASH assessment from 35% to 20% or better, for better quality of life. Goal status: INITIAL  2.  Pt will improve grip strength in Lt hand from unsafe to test to at least 40lbs for functional use at home and in IADLs. Goal status: INITIAL  3.  Pt will improve A/ROM in Lt wrist flex/ext from 21/11 to at least 30/25* to have better functional motion for tasks like reach and grasp.  Goal status: INITIAL  4.  Pt will improve strength in left forearm and wrist from unsafe to test  to at least 4+/5 MMT to have increased functional ability to carry out selfcare and higher-level homecare tasks with less difficulty. Goal status: INITIAL  5.  Pt will improve coordination skills in left arm, as seen by within functional limit score on box and blocks testing to have increased functional ability to carry out fine motor tasks (fasteners, etc.) and more complex, coordinated IADLs (meal prep, sports, etc.).  Goal status: INITIAL  6.  Pt will decrease pain at rest from 5-6/10 to 1/10 or better to have better sleep and occupational participation in daily roles. Goal status:  INITIAL  ASSESSMENT:  CLINICAL IMPRESSION: 05/06/2023: He has been having additional swelling and pain and not wearing his orthosis at all or in the night, and OT educates him today that we need to slow down, focus on reducing pain and swelling and wear the orthosis every night and more often in the day.  We will focus on managing these acute symptoms before we will start to push motion or any strength.  He was told to withhold therapy putty for 1 to 2 weeks as grip training was painful today.  04/29/23: He is stiff, swollen, painful which is somewhat increased recently.  OT highly encourages him to rest more and watch his pain levels, use modalities as needed.  OT feels that he is pushing himself a little too hard right now and he should be focused on healing and very gentle mobilization and now a light grip training.    PLAN:  OT FREQUENCY: 1-2x/week  OT DURATION: 6 weeks through 06/05/23 as needed  PLANNED INTERVENTIONS: 97168 OT Re-evaluation, 97535 self care/ADL training, 40981 therapeutic exercise, 97530 therapeutic activity, 97112 neuromuscular re-education, 97140  manual therapy, 97035 ultrasound, 16109 fluidotherapy, 97010 moist heat, 97010 cryotherapy, 97034 contrast bath, 97760 Orthotics management and training, 97760 Splinting (initial encounter), [phone] Subsequent splinting/medication, scar mobilization, compression bandaging, Dry needling, energy conservation, coping strategies training, patient/family education, and DME and/or AE instructions  CONSULTED AND AGREED WITH PLAN OF CARE: Patient  PLAN FOR NEXT SESSION:   Continue with manual therapy retrograde massages, very gentle stretches to the wrist and fingers and forearm.  Take new measures of swelling and motion if able, and focus on wearing the orthosis, resting, decreasing pain and swelling, as a priority.  He may bring his rigid orthosis (he should be wearing it when out of the house and driving), in for adjustments or  additional padding if its uncomfortable anywhere.  Fannie Knee, OTR/L, CHT 05/06/2023, 10:18 AM

## 2023-05-05 NOTE — Telephone Encounter (Signed)
 Pt is requesting a refill of Praluent

## 2023-05-05 NOTE — Telephone Encounter (Signed)
*  STAT* If patient is at the pharmacy, call can be transferred to refill team.   1. Which medications need to be refilled? (please list name of each medication and dose if known) Alirocumab (PRALUENT) 75 MG/ML SOAJ    4. Which pharmacy/location (including street and city if local pharmacy) is medication to be sent to? [id number] - [place], [place] - [address] AT [facility] [address]     5. Do they need a 30 day or 90 day supply? 90

## 2023-05-06 ENCOUNTER — Encounter: Payer: Self-pay | Admitting: Rehabilitative and Restorative Service Providers"

## 2023-05-06 ENCOUNTER — Other Ambulatory Visit: Payer: Self-pay

## 2023-05-06 ENCOUNTER — Ambulatory Visit: Payer: Medicare Other | Admitting: Rehabilitative and Restorative Service Providers"

## 2023-05-06 DIAGNOSIS — M25632 Stiffness of left wrist, not elsewhere classified: Secondary | ICD-10-CM

## 2023-05-06 DIAGNOSIS — M25532 Pain in left wrist: Secondary | ICD-10-CM | POA: Diagnosis not present

## 2023-05-06 DIAGNOSIS — M6281 Muscle weakness (generalized): Secondary | ICD-10-CM

## 2023-05-06 DIAGNOSIS — R278 Other lack of coordination: Secondary | ICD-10-CM

## 2023-05-06 DIAGNOSIS — R600 Localized edema: Secondary | ICD-10-CM | POA: Diagnosis not present

## 2023-05-07 ENCOUNTER — Other Ambulatory Visit: Payer: Self-pay | Admitting: Cardiology

## 2023-05-07 ENCOUNTER — Encounter: Payer: Self-pay | Admitting: Occupational Therapy

## 2023-05-07 ENCOUNTER — Other Ambulatory Visit (HOSPITAL_COMMUNITY): Payer: Self-pay

## 2023-05-07 ENCOUNTER — Other Ambulatory Visit (HOSPITAL_BASED_OUTPATIENT_CLINIC_OR_DEPARTMENT_OTHER): Payer: Self-pay

## 2023-05-07 DIAGNOSIS — I25110 Atherosclerotic heart disease of native coronary artery with unstable angina pectoris: Secondary | ICD-10-CM

## 2023-05-07 DIAGNOSIS — I2510 Atherosclerotic heart disease of native coronary artery without angina pectoris: Secondary | ICD-10-CM

## 2023-05-07 DIAGNOSIS — I700 Atherosclerosis of aorta: Secondary | ICD-10-CM

## 2023-05-07 DIAGNOSIS — I350 Nonrheumatic aortic (valve) stenosis: Secondary | ICD-10-CM

## 2023-05-07 DIAGNOSIS — Z789 Other specified health status: Secondary | ICD-10-CM

## 2023-05-08 ENCOUNTER — Other Ambulatory Visit (HOSPITAL_BASED_OUTPATIENT_CLINIC_OR_DEPARTMENT_OTHER): Payer: Self-pay

## 2023-05-11 ENCOUNTER — Other Ambulatory Visit (HOSPITAL_COMMUNITY): Payer: Self-pay

## 2023-05-11 ENCOUNTER — Telehealth: Payer: Self-pay | Admitting: Cardiology

## 2023-05-11 ENCOUNTER — Ambulatory Visit: Payer: Medicare Other | Admitting: Occupational Therapy

## 2023-05-11 ENCOUNTER — Encounter: Payer: Self-pay | Admitting: Occupational Therapy

## 2023-05-11 DIAGNOSIS — M25532 Pain in left wrist: Secondary | ICD-10-CM

## 2023-05-11 DIAGNOSIS — R600 Localized edema: Secondary | ICD-10-CM | POA: Diagnosis not present

## 2023-05-11 DIAGNOSIS — M79642 Pain in left hand: Secondary | ICD-10-CM

## 2023-05-11 DIAGNOSIS — M25632 Stiffness of left wrist, not elsewhere classified: Secondary | ICD-10-CM | POA: Diagnosis not present

## 2023-05-11 NOTE — Telephone Encounter (Signed)
Pt c/o medication issue:  1. Name of Medication:   Alirocumab (PRALUENT) 75 MG/ML SOAJ    2. How are you currently taking this medication (dosage and times per day)? Inject 1 mL (75 mg total) into the skin every 14 (fourteen) days.   3. Are you having a reaction (difficulty breathing--STAT)? No  4. What is your medication issue? Pt states that insurance will not cover medication. Even using our pharmacy, medication is still too expensive. Pt would like to know if another medication is able to be prescribed. Please advise

## 2023-05-11 NOTE — Telephone Encounter (Signed)
Spoke to pt, he is enrolled in the [facility] and it is good until 08/02/2023. Card info provided over the phone. Advised patient to share with pharmacy and if there is still issue call us back. PA is good until 04/04/02025.

## 2023-05-11 NOTE — Therapy (Signed)
OUTPATIENT OCCUPATIONAL THERAPY TREATMENT NOTE  Patient Name: Philip Winters MRN: [id number] DOB:09-Feb-1961, 62 y.o., male Today's Date: 05/11/2023  PCP: Sanda Linger, MD REFERRING PROVIDER:  Samuella Cota, MD    END OF SESSION:  OT End of Session - 05/11/23 0934     Visit Number 6    Number of Visits 8    Date for OT Re-Evaluation 06/05/23    Authorization Type BCBS Medicare    OT Start Time 0932    OT Stop Time 1015    OT Time Calculation (min) 43 min    Activity Tolerance Patient tolerated treatment well;No increased pain;Patient limited by fatigue;Patient limited by pain    Behavior During Therapy [facility] for tasks assessed/performed              Past Medical History:  Diagnosis Date   Allergy    Arthritis    CAD (coronary artery disease)    GERD (gastroesophageal reflux disease)    occ   Heart murmur    baby   High cholesterol    Hypertension    Peripheral neuropathy    Pneumonia    Right carotid bruit 06/10/2017   Sciatica    Seasonal allergies    Sinus tachycardia    Spinal stenosis    Past Surgical History:  Procedure Laterality Date   CARDIAC CATHETERIZATION     CARPAL TUNNEL WITH CUBITAL TUNNEL Left 11/10/2013   Procedure: LEFT CARPAL TUNNEL RELEASE;  Surgeon: Wyn Forster, MD;  Location: [facility];  Service: Orthopedics;  Laterality: Left;   CARPECTOMY Left 03/13/2023   Procedure: LEFT WRIST PROXIMAL ROW CARPECTOMY WITH PLACEMENT ALLOGRAFT SPACER / THUMB CMC DENERVATION;  Surgeon: Samuella Cota, MD;  Location: [facility];  Service: Orthopedics;  Laterality: Left;   COLONOSCOPY  2023   CORONARY ARTERY BYPASS GRAFT N/A 05/01/2020   Procedure: CORONARY ARTERY BYPASS GRAFTING (CABG) X 2 ON CARDIOPULMONARY BYPASS. LIMA TO LAD, SVG TO DIAG;  Surgeon: Kerin Perna, MD;  Location: [facility] OR;  Service: Open Heart Surgery;  Laterality: N/A;   ENDOVEIN HARVEST OF GREATER SAPHENOUS VEIN Right 05/01/2020   Procedure:  ENDOVEIN HARVEST OF GREATER SAPHENOUS VEIN;  Surgeon: Kerin Perna, MD;  Location: [facility] OR;  Service: Open Heart Surgery;  Laterality: Right;   KNEE ARTHROSCOPY  [id number]   left and right   LUMBAR LAMINECTOMY/DECOMPRESSION MICRODISCECTOMY Left 09/29/2017   Procedure: LEFT LUMBAR TWO - LUMBAR THREELAMINOTOMY/MICRODISCECTOMY;  Surgeon: Shirlean Tobiah Celestine, MD;  [facility];  Service: Neurosurgery;  Laterality: Left;  LEFT LUMBAR 2- LUMBAR 3 LAMINOTOMY/MICRODISCECTOMY   NASAL SINUS SURGERY  05/2015   RIGHT/LEFT HEART CATH AND CORONARY ANGIOGRAPHY N/A 04/10/2020   Procedure: RIGHT/LEFT HEART CATH AND CORONARY ANGIOGRAPHY;  Surgeon: Lennette Bihari, MD;  Location: [facility];  Service: Cardiovascular;  Laterality: N/A;   TEE WITHOUT CARDIOVERSION N/A 05/01/2020   Procedure: TRANSESOPHAGEAL ECHOCARDIOGRAM (TEE);  Surgeon: Donata Clay, Theron Arista, MD;  Location: [facility] OR;  Service: Open Heart Surgery;  Laterality: N/A;   TOTAL KNEE ARTHROPLASTY Right 06/24/2016   Procedure: TOTAL KNEE ARTHROPLASTY;  Surgeon: Valeria Batman, MD;  Location: [facility] OR;  Service: Orthopedics;  Laterality: Right;   TOTAL KNEE ARTHROPLASTY Left 12/18/2020   Procedure: LEFT TOTAL KNEE ARTHROPLASTY;  Surgeon: Valeria Batman, MD;  Location: [facility];  Service: Orthopedics;  Laterality: Left;   TRANSFORAMINAL LUMBAR INTERBODY FUSION W/ MIS 2 LEVEL Left 04/15/2022   Procedure: Minimally Invasive Surgery Decompression and Transforaminal Lumbar  Interbody Fusion , Left , Lumbar three-four, Lumbar four-five;  Surgeon: Dawley, Alan Mulder, DO;  Location: [facility] OR;  Service: Neurosurgery;  Laterality: Left;   TRIGGER FINGER RELEASE Left 11/10/2013   Procedure: LEFT INDEX A-1 PULLEY RELEASE;  Surgeon: Wyn Forster, MD;  Location: [facility];  Service: Orthopedics;  Laterality: Left;   ULNAR NERVE TRANSPOSITION Left 11/10/2013   Procedure: ULNAR NERVE TRANSPOSITION;  Surgeon: Wyn Forster, MD;  Location: [facility];  Service: Orthopedics;  Laterality: Left;   Patient Active Problem List   Diagnosis Date Noted   Scapholunate advanced collapse of left wrist 03/13/2023   Arthritis of carpometacarpal [facility]) joint of left thumb 03/13/2023   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) 09/22/2022   Encounter for general adult medical examination with abnormal findings 09/22/2022   Encounter for screening for HIV 09/18/2022   Need for hepatitis C screening test 09/18/2022   Numbness in feet 09/18/2022   Tachycardia 09/18/2022   Dyslipidemia, goal LDL below 70 09/18/2022   Idiopathic small fiber peripheral neuropathy 09/18/2022   Chronic hyperglycemia 09/18/2022   Lumbar spinal stenosis 04/15/2022   Chronic pain of left thumb 02/25/2022   SBE (subacute bacterial endocarditis) prophylaxis candidate 12/16/2021   Cervical radiculopathy 08/13/2021   Ulnar neuropathy 08/13/2021   Screen for colon cancer 06/06/2021   Chronic tachycardia 06/04/2021   Candidal balanitis 06/04/2021   Carpal tunnel syndrome, bilateral upper limbs 09/26/2020   Cubital tunnel syndrome on left 08/23/2020   Pulmonary nodule 1 cm or greater in diameter 04/26/2020   Statin intolerance 04/20/2020   Coronary atherosclerosis of native coronary artery 04/18/2020   Nonrheumatic aortic valve stenosis    Bicuspid aortic valve 04/06/2020   Primary osteoarthritis of left knee 11/08/2019   Bilateral primary osteoarthritis of knee 09/28/2019   HNP (herniated nucleus pulposus), lumbar 09/29/2017   Pain in left wrist 10/09/2016   Unilateral primary osteoarthritis, right knee 06/24/2016   Primary hypertension 01/18/2007    ONSET DATE: DOS 03/13/23  REFERRING DIAG: Z61.096 (ICD-10-CM) - [place] (scapholunate advanced collapse) of wrist, left   THERAPY DIAG:  Pain in left wrist  Stiffness of left wrist, not elsewhere classified  Localized edema  Pain in left hand  Rationale for Evaluation and Treatment:  Rehabilitation  PERTINENT HISTORY: Was seen in hand therapy in 2023 for thumb fx as well as trigger finger, but he only needed to attended 2 sessions.   He has a long history of arthritis in both hands and wrists and he states that he was unable to make a complete fist even before the surgery.  He also states having some pain now postop, just had a cast removed today and that his stitches were taken out 2 to 3 weeks ago.  He is currently has Steri-Strips on.  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  He states decreased pain, swelling today. Was not wearing rigid orthosis w/ driving, but wearing at night.   "Pain a little better today. I've only been wearing rigid splint at night; I didn't know I was supposed to wear it driving"  PAIN:  Are you having pain? Yes: NPRS scale: 5/10, mainly stiff Pain location: Lt wrist Pain description: aching, worse at  night Aggravating factors: Intermittent swelling and any attempted weightbearing Relieving factors: Rest, elevation  PATIENT GOALS: Improve the use of his left nondominant side  NEXT MD VISIT: 05/18/2023   OBJECTIVE: (All objective assessments below are from initial evaluation on: 04/20/23 unless otherwise specified.)    HAND DOMINANCE: Right   ADLs: Overall ADLs: States decreased ability to grab, hold household objects, pain and difficulty to open containers, perform FMS tasks (manipulate fasteners on clothing), mild to moderate bathing problems as well.    FUNCTIONAL OUTCOME MEASURES: 04/22/23:  Quick DASH 35% impairment today  (Higher % Score  =  More Impairment)     UPPER EXTREMITY ROM     Shoulder to Wrist AROM Left eval Lt 04/22/23 Lt 04/27/23 Lt 04/29/23 Lt   05/06/23  Forearm supination 68 73     Forearm pronation  75 72     Wrist flexion 21 16 20 21 15  (up to 18* after manual therapy)  Wrist extension 11 13 15 11 20  (24* after DN)   Wrist ulnar deviation 7  17 11    Wrist radial deviation 5  (-1) 0   Functional dart thrower's motion (F-DTM) in ulnar flexion       F-DTM in radial extension        (Blank rows = not tested)   Hand AROM Left eval Lt 04/27/23 Lt 04/29/23 Lt  05/06/2023  Full Fist Ability (or Gap to Distal Palmar Crease) 4.9cm gap from tip of MF to [facility] 3.3cm cm gap from tip MF to [facility] 3cm gap from tip MF to [facility] 2.8 Centimeter gap from tip of middle finger to distal palmar crease  Thumb Opposition  (Kapandji Scale)  4/10 4/10    (Blank rows = not tested)   UPPER EXTREMITY MMT:    Eval:  NT at eval due to recent and still healing injuries. Will be tested when appropriate.   MMT Left TBD  Elbow flexion   Elbow extension   Forearm supination   Forearm pronation   Wrist flexion   Wrist extension   Wrist ulnar deviation   Wrist radial deviation   (Blank rows = not tested)  HAND FUNCTION: 05/06/2023: Grip strength left hand 10 pounds   04/29/23: Grip strength Right: 61 lbs, Left: 11 lbs   Eval: Observed weakness in affected Lt hand.  Details TBD when safe   COORDINATION: 04/22/23: Box and Blocks Test: 28 Blocks today (54 is WFL)  Eval: Observed coordination impairments with affected Lt hand.  Details TBD when able   SENSATION: Eval:  States light touch intact today, even into thumb dorsum and around sx area, though diminished somewhat around sx area    EDEMA:   05/08/23: 20.8cm circumferentially around distal Lt wrist crease (compared to 16.9cm Rt side)   04/27/23: edema today: 20cm circumferentially around distal Lt wrist crease (compared to 16.9cm Rt side)   OBSERVATIONS:   Eval: He has an apparent ulnar sagging in the wrist and difficulty with radial deviation.  The surgical area is covered by  Steri-Strips and looks to be crusted with some dried blood but otherwise healing well and no significant cardinal signs of infection.  Skin is dry.  He is unable to make a full fist or oppose to his middle finger.    TODAY'S TREATMENT:  05/11/2023:  Reviewed that pt needs to wear rigid orthosis when out of house and driving, as pt was only wearing at night.  Worked on gentle stretches in MP flexion followed by  composite flexion while keeping MP's in flexion (as pt tends to come back into MP ext w/ IP flexion).  Then worked on gentle stretches in wrist flex and ext w/ emphasis on wrist extension.  Progressed to place and hold ex's in wrist extension with fingers in extension, and then with fingers in flexion to prevent assist w/ EDC w/ more difficulty.   Pt w/ decreased overall pain today and decreased swelling. Did discuss options for compression glove including cut fingers of glove for easier donning (pt reports he does not have as much swelling distal to PIP's), but longer on forearm   05/06/2023:  He performs AROM, grip for exercises and performs worse today due to increased swelling and pain.  For safety, OT recommends to wear rigid orthosis at night and in day more often to rest more.  OT also recommends with holding therapy putty for at least a week as when he squeezed today it was painful to the ulnar side of his wrist.  For self-care/safety he was told to rest more/do less repetitive or forceful activities, be more mindful of his body.  OT spends the rest of the session doing manual therapy: Retrograde massages, very light stretches to the wrist in all planes of motion, stretches to the fingers at the MP joints, the IP joints, and in composite flexion.   Additionally with his consent and permission after discussion of the possible benefits and negative effects, OT does manual therapy dry needling to the proximal muscles of the forearm to try to alleviate some stiffness or pain that he is  feeling through his dorsal wrist.  OT uses a 0.25 x 30mm needle inserted only partly into the extensor digitorum communis muscle belly achieving several good twitch responses, no bruising or bleeding, no lingering pain, but he does get improved motion and wrist extension afterwards.  Generally his motion improves after manual therapy treatments today though there is no significant change in his swelling for now.  He does not have his rigid orthosis with him now, so he was assisted to wear his compressive wrist strap-though he was asked to have his orthosis on every night and when out of the home and most of the day now to help him rest as he is probably likely not giving his arm enough rest.        04/29/23: He performs active range of motion for exercise as well as new measures which shows unfortunate stiffness increasing in the wrist which seems to be increasing as well as his pain is increasing.  OT educates to be wearing some kind of brace or support, slow down if swollen sore and painful, and not to be lifting pushing or pulling anything too heavy or painful now.  As he is about 7 weeks postop, OT starts him on light grip training today as well as rubber band and therapy putty activities which is nonpainful to him, but he does feel some active stretching to the dorsum of his hand with squeezes.  He was advised to do these things only 2-3 times a day, gently, take days off if sore, and to be warmed up and stretch out first.  New strengthening activities bolded below.  Lastly, to help lower his pain, OT does manual therapy IASTM with light concurrent stretches, myofascial release to the dorsum of the hand, gentle retrograde massages.  He states feeling some relief of pain and pressure at the end of the session.   Exercises - Forearm Supination Stretch  -  3-4 x daily - 3-5 reps - 15 sec hold - Forearm Pronation Stretch  - 3-4 x daily - 3-5 reps - 15 sec hold - Wrist Flexion Stretch  - 4 x daily - 3-5  reps - 15 sec hold - Wrist Extension Stretch Pronated  - 4 x daily - 3-5 reps - 15 hold - SPATULA stretch   - 4-6 x daily - 3-5 reps - 15 sec hold - BACK KNUCKLE STRETCHES   - 4 x daily - 3-5 reps - 15 sec hold - HOOK Stretch  - 4 x daily - 3-5 reps - 15-20 sec hold - Seated Finger Composite Flexion Stretch  - 4 x daily - 3-5 reps - 15 hold - PUSH KNUCKLES DOWN  - 4 x daily - 3-5 reps - 15 seconds hold - Bend and Pull Back Wrist SLOWLY  - 4 x daily - 10-15 reps - "Windshield Wipers"   - 4 x daily - 10-15 reps - Tendon Glides  - 4-6 x daily - 3-5 reps - 2-3 seconds hold - Full Fist  - 2-3 x daily - 3-5 x weekly - 5 reps - 10 sec hold - Thumb Opposition with Putty  - 2-3 x daily - 3-5 x weekly - 5 reps - Finger Extension with Putty  - 4-6 x daily - 1 sets - 10 reps - 5 sec hold    PATIENT EDUCATION: Education details: See tx section above for details  Person educated: Patient Education method: Engineer, structural, Teach back, Handouts  Education comprehension: States and demonstrates understanding, Additional Education required    HOME EXERCISE PROGRAM: Access Code: [email or web address] URL: [email or web address] Date: 04/20/2023 Prepared by: Fannie Knee   GOALS: Goals reviewed with patient? Yes   SHORT TERM GOALS: (STG required if POC>30 days) Target Date: 05/15/23  Pt will obtain protective, custom orthotic. Goal status: 04/20/23 MET   2.  Pt will demo/state understanding of initial HEP to improve pain levels and prerequisite motion. Goal status: 04/22/23: MET   LONG TERM GOALS: Target Date: 06/05/23  Pt will improve functional ability by decreased impairment per Quick DASH assessment from 35% to 20% or better, for better quality of life. Goal status: INITIAL  2.  Pt will improve grip strength in Lt hand from unsafe to test to at least 40lbs for functional use at home and in IADLs. Goal status: INITIAL  3.  Pt will improve A/ROM in Lt wrist flex/ext from 21/11  to at least 30/25* to have better functional motion for tasks like reach and grasp.  Goal status: INITIAL  4.  Pt will improve strength in left forearm and wrist from unsafe to test  to at least 4+/5 MMT to have increased functional ability to carry out selfcare and higher-level homecare tasks with less difficulty. Goal status: INITIAL  5.  Pt will improve coordination skills in left arm, as seen by within functional limit score on box and blocks testing to have increased functional ability to carry out fine motor tasks (fasteners, etc.) and more complex, coordinated IADLs (meal prep, sports, etc.).  Goal status: INITIAL  6.  Pt will decrease pain at rest from 5-6/10 to 1/10 or better to have better sleep and occupational participation in daily roles. Goal status: INITIAL  ASSESSMENT:  CLINICAL IMPRESSION: 05/11/23: Pt reports decreased pain today and mild swelling. Pt tolerating self guided stretches well today but difficulty w/ place and hold. Also lacks MP flexion w/ full composite hand flexion d/t extrinsic tightness.  05/06/2023: He has been having additional swelling and pain and not wearing his orthosis at all or in the night, and OT educates him today that we need to slow down, focus on reducing pain and swelling and wear the orthosis every night and more often in the day.  We will focus on managing these acute symptoms before we will start to push motion or any strength.  He was told to withhold therapy putty for 1 to 2 weeks as grip training was painful today.  04/29/23: He is stiff, swollen, painful which is somewhat increased recently.  OT highly encourages him to rest more and watch his pain levels, use modalities as needed.  OT feels that he is pushing himself a little too hard right now and he should be focused on healing and very gentle mobilization and now a light grip training.    PLAN:  OT FREQUENCY: 1-2x/week  OT DURATION: 6 weeks through 06/05/23 as needed  PLANNED  INTERVENTIONS: 97168 OT Re-evaluation, 97535 self care/ADL training, 41324 therapeutic exercise, 97530 therapeutic activity, 97112 neuromuscular re-education, 97140 manual therapy, 97035 ultrasound, 97039 fluidotherapy, 97010 moist heat, 97010 cryotherapy, 97034 contrast bath, 97760 Orthotics management and training, 40102 Splinting (initial encounter), [id number] Subsequent splinting/medication, scar mobilization, compression bandaging, Dry needling, energy conservation, coping strategies training, patient/family education, and DME and/or AE instructions  CONSULTED AND AGREED WITH PLAN OF CARE: Patient  PLAN FOR NEXT SESSION:   Continue with manual therapy retrograde massages, very gentle stretches to the wrist and fingers and forearm.  Take new measures of swelling and motion if able, and focus on wearing the orthosis, resting, decreasing pain and swelling, as a priority.  He may bring his rigid orthosis (he should be wearing it when out of the house and driving), in for adjustments or additional padding if its uncomfortable anywhere.  Sheran Lawless, OTR/L, CHT 05/11/2023, 9:34 AM

## 2023-05-11 NOTE — Telephone Encounter (Signed)
Will forward this to our PharmD and Rx prior auth team to see if they can assist the pt with praluent injection cost.

## 2023-05-12 ENCOUNTER — Other Ambulatory Visit (HOSPITAL_BASED_OUTPATIENT_CLINIC_OR_DEPARTMENT_OTHER): Payer: Self-pay

## 2023-05-18 ENCOUNTER — Ambulatory Visit (INDEPENDENT_AMBULATORY_CARE_PROVIDER_SITE_OTHER): Payer: Medicare Other | Admitting: Orthopedic Surgery

## 2023-05-18 ENCOUNTER — Encounter: Payer: Self-pay | Admitting: Rehabilitative and Restorative Service Providers"

## 2023-05-18 ENCOUNTER — Ambulatory Visit: Payer: Medicare Other | Admitting: Rehabilitative and Restorative Service Providers"

## 2023-05-18 ENCOUNTER — Ambulatory Visit (INDEPENDENT_AMBULATORY_CARE_PROVIDER_SITE_OTHER): Payer: Medicare Other

## 2023-05-18 DIAGNOSIS — M19132 Post-traumatic osteoarthritis, left wrist: Secondary | ICD-10-CM

## 2023-05-18 DIAGNOSIS — R600 Localized edema: Secondary | ICD-10-CM | POA: Diagnosis not present

## 2023-05-18 DIAGNOSIS — R278 Other lack of coordination: Secondary | ICD-10-CM

## 2023-05-18 DIAGNOSIS — M79642 Pain in left hand: Secondary | ICD-10-CM | POA: Diagnosis not present

## 2023-05-18 DIAGNOSIS — M25532 Pain in left wrist: Secondary | ICD-10-CM | POA: Diagnosis not present

## 2023-05-18 DIAGNOSIS — M6281 Muscle weakness (generalized): Secondary | ICD-10-CM

## 2023-05-18 DIAGNOSIS — M25632 Stiffness of left wrist, not elsewhere classified: Secondary | ICD-10-CM | POA: Diagnosis not present

## 2023-05-18 NOTE — Therapy (Signed)
OUTPATIENT OCCUPATIONAL THERAPY TREATMENT NOTE  Patient Name: Philip Winters MRN: [id number] DOB:Mar 29, 1961, 62 y.o., male Today's Date: 05/18/2023  PCP: Sanda Linger, MD REFERRING PROVIDER:  Samuella Cota, MD    END OF SESSION:  OT End of Session - 05/18/23 1303     Visit Number 7    Number of Visits 8    Date for OT Re-Evaluation 06/05/23    Authorization Type BCBS Medicare    OT Start Time 1303    OT Stop Time 1350    OT Time Calculation (min) 47 min    Activity Tolerance Patient tolerated treatment well;No increased pain;Patient limited by fatigue;Patient limited by pain    Behavior During Therapy [facility] for tasks assessed/performed              Past Medical History:  Diagnosis Date   Allergy    Arthritis    CAD (coronary artery disease)    GERD (gastroesophageal reflux disease)    occ   Heart murmur    baby   High cholesterol    Hypertension    Peripheral neuropathy    Pneumonia    Right carotid bruit 06/10/2017   Sciatica    Seasonal allergies    Sinus tachycardia    Spinal stenosis    Past Surgical History:  Procedure Laterality Date   CARDIAC CATHETERIZATION     CARPAL TUNNEL WITH CUBITAL TUNNEL Left 11/10/2013   Procedure: LEFT CARPAL TUNNEL RELEASE;  Surgeon: Wyn Forster, MD;  Location: [facility];  Service: Orthopedics;  Laterality: Left;   CARPECTOMY Left 03/13/2023   Procedure: LEFT WRIST PROXIMAL ROW CARPECTOMY WITH PLACEMENT ALLOGRAFT SPACER / THUMB CMC DENERVATION;  Surgeon: Samuella Cota, MD;  Location: [facility];  Service: Orthopedics;  Laterality: Left;   COLONOSCOPY  2023   CORONARY ARTERY BYPASS GRAFT N/A 05/01/2020   Procedure: CORONARY ARTERY BYPASS GRAFTING (CABG) X 2 ON CARDIOPULMONARY BYPASS. LIMA TO LAD, SVG TO DIAG;  Surgeon: Kerin Perna, MD;  Location: [facility] OR;  Service: Open Heart Surgery;  Laterality: N/A;   ENDOVEIN HARVEST OF GREATER SAPHENOUS VEIN Right 05/01/2020   Procedure:  ENDOVEIN HARVEST OF GREATER SAPHENOUS VEIN;  Surgeon: Kerin Perna, MD;  Location: [facility] OR;  Service: Open Heart Surgery;  Laterality: Right;   KNEE ARTHROSCOPY  [id number]   left and right   LUMBAR LAMINECTOMY/DECOMPRESSION MICRODISCECTOMY Left 09/29/2017   Procedure: LEFT LUMBAR TWO - LUMBAR THREELAMINOTOMY/MICRODISCECTOMY;  Surgeon: Shirlean Kelly, MD;  Location: [facility] OR;  Service: Neurosurgery;  Laterality: Left;  LEFT LUMBAR 2- LUMBAR 3 LAMINOTOMY/MICRODISCECTOMY   NASAL SINUS SURGERY  05/2015   RIGHT/LEFT HEART CATH AND CORONARY ANGIOGRAPHY N/A 04/10/2020   Procedure: RIGHT/LEFT HEART CATH AND CORONARY ANGIOGRAPHY;  Surgeon: Lennette Bihari, MD;  Location: [facility];  Service: Cardiovascular;  Laterality: N/A;   TEE WITHOUT CARDIOVERSION N/A 05/01/2020   Procedure: TRANSESOPHAGEAL ECHOCARDIOGRAM (TEE);  Surgeon: Donata Clay, Theron Arista, MD;  Location: [facility] OR;  Service: Open Heart Surgery;  Laterality: N/A;   TOTAL KNEE ARTHROPLASTY Right 06/24/2016   Procedure: TOTAL KNEE ARTHROPLASTY;  Surgeon: Valeria Batman, MD;  Location: [facility] OR;  Service: Orthopedics;  Laterality: Right;   TOTAL KNEE ARTHROPLASTY Left 12/18/2020   Procedure: LEFT TOTAL KNEE ARTHROPLASTY;  Surgeon: Valeria Batman, MD;  Location: [facility];  Service: Orthopedics;  Laterality: Left;   TRANSFORAMINAL LUMBAR INTERBODY FUSION W/ MIS 2 LEVEL Left 04/15/2022   Procedure: Minimally Invasive Surgery Decompression and Transforaminal Lumbar  Interbody Fusion , Left , Lumbar three-four, Lumbar four-five;  Surgeon: Dawley, Alan Mulder, DO;  Location: [facility] OR;  Service: Neurosurgery;  Laterality: Left;   TRIGGER FINGER RELEASE Left 11/10/2013   Procedure: LEFT INDEX A-1 PULLEY RELEASE;  Surgeon: Wyn Forster, MD;  Location: [facility];  Service: Orthopedics;  Laterality: Left;   ULNAR NERVE TRANSPOSITION Left 11/10/2013   Procedure: ULNAR NERVE TRANSPOSITION;  Surgeon: Wyn Forster, MD;  Location: [facility];  Service: Orthopedics;  Laterality: Left;   Patient Active Problem List   Diagnosis Date Noted   Scapholunate advanced collapse of left wrist 03/13/2023   Arthritis of carpometacarpal [facility]) joint of left thumb 03/13/2023   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) 09/22/2022   Encounter for general adult medical examination with abnormal findings 09/22/2022   Encounter for screening for HIV 09/18/2022   Need for hepatitis C screening test 09/18/2022   Numbness in feet 09/18/2022   Tachycardia 09/18/2022   Dyslipidemia, goal LDL below 70 09/18/2022   Idiopathic small fiber peripheral neuropathy 09/18/2022   Chronic hyperglycemia 09/18/2022   Lumbar spinal stenosis 04/15/2022   Chronic pain of left thumb 02/25/2022   SBE (subacute bacterial endocarditis) prophylaxis candidate 12/16/2021   Cervical radiculopathy 08/13/2021   Ulnar neuropathy 08/13/2021   Screen for colon cancer 06/06/2021   Chronic tachycardia 06/04/2021   Candidal balanitis 06/04/2021   Carpal tunnel syndrome, bilateral upper limbs 09/26/2020   Cubital tunnel syndrome on left 08/23/2020   Pulmonary nodule 1 cm or greater in diameter 04/26/2020   Statin intolerance 04/20/2020   Coronary atherosclerosis of native coronary artery 04/18/2020   Nonrheumatic aortic valve stenosis    Bicuspid aortic valve 04/06/2020   Primary osteoarthritis of left knee 11/08/2019   Bilateral primary osteoarthritis of knee 09/28/2019   HNP (herniated nucleus pulposus), lumbar 09/29/2017   Pain in left wrist 10/09/2016   Unilateral primary osteoarthritis, right knee 06/24/2016   Primary hypertension 01/18/2007    ONSET DATE: DOS 03/13/23  REFERRING DIAG: Z61.096 (ICD-10-CM) - Slac (scapholunate advanced collapse) of wrist, left   THERAPY DIAG:  Pain in left wrist  Stiffness of left wrist, not elsewhere classified  Localized edema  Pain in left hand  Muscle weakness (generalized)  Other lack of  coordination  Rationale for Evaluation and Treatment: Rehabilitation  PERTINENT HISTORY: Was seen in hand therapy in 2023 for thumb fx as well as trigger finger, but he only needed to attended 2 sessions.   He has a long history of arthritis in both hands and wrists and he states that he was unable to make a complete fist even before the surgery.  He also states having some pain now postop, just had a cast removed today and that his stitches were taken out 2 to 3 weeks ago.  He is currently has Steri-Strips on.  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  He states that he has been "taking it easy" though he rates his pain higher today than previously.  He states he is discouraged and OT feels that his pain ratings are somewhat affected by his discouraged status.  Again he is not wearing his rigid orthosis but rather a copper fit style compression glove  today.   PAIN:  Are you having pain? Yes: NPRS scale: 7/10, mainly stiff Pain location: Lt wrist Pain description: aching, worse at night Aggravating factors: Intermittent swelling and any attempted weightbearing Relieving factors: Rest, elevation  PATIENT GOALS: Improve the use of his left nondominant side  NEXT MD VISIT: 05/18/2023   OBJECTIVE: (All objective assessments below are from initial evaluation on: 04/20/23 unless otherwise specified.)    HAND DOMINANCE: Right   ADLs: Overall ADLs: States decreased ability to grab, hold household objects, pain and difficulty to open containers, perform FMS tasks (manipulate fasteners on clothing), mild to moderate bathing problems as well.    FUNCTIONAL OUTCOME MEASURES: 04/22/23:  Quick DASH 35% impairment today  (Higher % Score  =   More Impairment)     UPPER EXTREMITY ROM     Shoulder to Wrist AROM Left eval Lt 04/22/23 Lt 04/27/23 Lt 04/29/23 Lt  05/06/23 Lt 05/18/23  Forearm supination 68 73      Forearm pronation  75 72      Wrist flexion 21 16 20 21 15  (up to 18* after manual therapy) 13  Wrist extension 11 13 15 11 20  (24* after DN)  18  Wrist ulnar deviation 7  17 11  10   Wrist radial deviation 5  (-1) 0  6  Functional dart thrower's motion (F-DTM) in ulnar flexion        F-DTM in radial extension         (Blank rows = not tested)   Hand AROM Left eval Lt 04/27/23 Lt 04/29/23 Lt  05/06/2023  Full Fist Ability (or Gap to Distal Palmar Crease) 4.9cm gap from tip of MF to [facility] 3.3cm cm gap from tip MF to [facility] 3cm gap from tip MF to [facility] 2.8 Centimeter gap from tip of middle finger to distal palmar crease  Thumb Opposition  (Kapandji Scale)  4/10 4/10    (Blank rows = not tested)   UPPER EXTREMITY MMT:    Eval:  NT at eval due to recent and still healing injuries. Will be tested when appropriate.   MMT Left TBD  Elbow flexion   Elbow extension   Forearm supination   Forearm pronation   Wrist flexion   Wrist extension   Wrist ulnar deviation   Wrist radial deviation   (Blank rows = not tested)  HAND FUNCTION: 05/06/2023: Grip strength left hand 10 pounds   04/29/23: Grip strength Right: 61 lbs, Left: 11 lbs   Eval: Observed weakness in affected Lt hand.  Details TBD when safe   COORDINATION: 04/22/23: Box and Blocks Test: 28 Blocks today (54 is WFL)  Eval: Observed coordination impairments with affected Lt hand.  Details TBD when able   SENSATION: Eval:  States light touch intact today, even into thumb dorsum and around sx area, though diminished somewhat around sx area    EDEMA:   05/08/23: 20.8cm circumferentially around distal Lt wrist crease (compared to 16.9cm Rt side)   04/27/23: edema today: 20cm circumferentially around distal Lt wrist crease (compared to 16.9cm Rt side)    OBSERVATIONS:   Eval: He has an apparent ulnar sagging in the wrist and difficulty with radial deviation.  The surgical area is covered by Steri-Strips and looks to be crusted with some dried blood but otherwise healing well and no significant cardinal signs of infection.  Skin is dry.  He is unable to make a full fist or oppose to his middle finger.    TODAY'S TREATMENT:  05/18/23: He starts with active range of motion for new measurements as well as exercises-these unfortunately show some increase stiffness but also better radial deviation now.  In an attempt to decrease his high pain ratings, OT uses moist heat modality for about 3 minutes around his wrist, which she finds somewhat pain relieving.  OT then does manual therapy joint mobilizations gently and nonpainful he followed by manual therapy stretches primarily in a radial deviation direction away from the sore ulnar side of the wrist.  OT also mixes in flexion and extension stretches as well as forearm rotational stretches including finger and thumb stretches as well.  Additionally he has a tight tendon and muscle belly traveling near the extensor carpi all naris which he agrees to do manual therapy dry needling modality with.  He does get several small twitches (OT using a 0.30 x 30mm needle inserted only partly into the muscle belly), no significant bleeding or bruising.  After all manual therapy techniques today and dry needling, he states pain is "about the same" unfortunately.  OT does feel that his motion did improve and he felt a bit looser but unfortunately he does not seem to be getting much relief.  In an attempt to focus on things we can change, OT assigns isometric grip training and isometric resistance and radial deviation to attempt to improve "ulnar sagging" as well as tight and weak fingers.  He tolerates these well today and is told to add these into his exercise program at least 4 times a day.   Exercises - Forearm  Supination Stretch  - 3-4 x daily - 3-5 reps - 15 sec hold - Forearm Pronation Stretch  - 3-4 x daily - 3-5 reps - 15 sec hold - Wrist Flexion Stretch  - 4 x daily - 3-5 reps - 15 sec hold - Wrist Extension Stretch Pronated  - 4 x daily - 3-5 reps - 15 hold - SPATULA stretch   - 4-6 x daily - 3-5 reps - 15 sec hold - BACK KNUCKLE STRETCHES   - 4 x daily - 3-5 reps - 15 sec hold - HOOK Stretch  - 4 x daily - 3-5 reps - 15-20 sec hold - Seated Finger Composite Flexion Stretch  - 4 x daily - 3-5 reps - 15 hold - PUSH KNUCKLES DOWN  - 4 x daily - 3-5 reps - 15 seconds hold - Bend and Pull Back Wrist SLOWLY  - 4 x daily - 10-15 reps - "Windshield Wipers"   - 4 x daily - 10-15 reps - Tendon Glides  - 4-6 x daily - 3-5 reps - 2-3 seconds hold - Full Fist  - 2-3 x daily - 3-5 x weekly - 5 reps - 10 sec hold - Thumb Opposition with Putty  - 2-3 x daily - 3-5 x weekly - 5 reps - Finger Extension with Putty  - 4-6 x daily - 1 sets - 10 reps - 5 sec hold - Towel Roll Grip with Forearm in Neutral  - 3 x daily - 5 reps - 10 sec hold - Seated Isometric Wrist Radial Deviation with Manual Resistance  - 4-6 x daily - 1 sets - 10-15 reps  PATIENT EDUCATION: Education details: See tx section above for details  Person educated: Patient Education method: Verbal Instruction, Teach back, Handouts  Education comprehension: States and demonstrates understanding, Additional Education required    HOME EXERCISE PROGRAM: Access Code: [email or web address] URL: [email or web address] Date: 04/20/2023 Prepared by: Fannie Knee  GOALS: Goals reviewed with patient? Yes   SHORT TERM GOALS: (STG required if POC>30 days) Target Date: 05/15/23  Pt will obtain protective, custom orthotic. Goal status: 04/20/23 MET   2.  Pt will demo/state understanding of initial HEP to improve pain levels and prerequisite motion. Goal status: 04/22/23: MET   LONG TERM GOALS: Target Date: 06/05/23  Pt will improve  functional ability by decreased impairment per Quick DASH assessment from 35% to 20% or better, for better quality of life. Goal status: INITIAL  2.  Pt will improve grip strength in Lt hand from unsafe to test to at least 40lbs for functional use at home and in IADLs. Goal status: INITIAL  3.  Pt will improve A/ROM in Lt wrist flex/ext from 21/11 to at least 30/25* to have better functional motion for tasks like reach and grasp.  Goal status: INITIAL  4.  Pt will improve strength in left forearm and wrist from unsafe to test  to at least 4+/5 MMT to have increased functional ability to carry out selfcare and higher-level homecare tasks with less difficulty. Goal status: INITIAL  5.  Pt will improve coordination skills in left arm, as seen by within functional limit score on box and blocks testing to have increased functional ability to carry out fine motor tasks (fasteners, etc.) and more complex, coordinated IADLs (meal prep, sports, etc.).  Goal status: INITIAL  6.  Pt will decrease pain at rest from 5-6/10 to 1/10 or better to have better sleep and occupational participation in daily roles. Goal status: INITIAL  ASSESSMENT:  CLINICAL IMPRESSION: 05/18/23: He continues to have this ulnar sagging as well as puffy swelling along the dorsum of the wrist.  He states that he would love to get his fingers working better as well, so OT will focus more on this in upcoming sessions versus his sore and stiff wrist.  Stiffness and soreness is mainly ulnar-sided, so we are focusing on stretching this area-unfortunately he states no significant relief in pain after manual therapy today.  We will use more modalities in the future.  He does continue to be noncompliant with rigid orthoses as he finds them restrictive and uncomfortable.     PLAN:  OT FREQUENCY: 1-2x/week  OT DURATION: 6 weeks through 06/05/23 as needed  PLANNED INTERVENTIONS: 97168 OT Re-evaluation, 97535 self care/ADL training,  97110 therapeutic exercise, 97530 therapeutic activity, 97112 neuromuscular re-education, 97140 manual therapy, 97035 ultrasound, 03474 fluidotherapy, 97010 moist heat, 97010 cryotherapy, 97034 contrast bath, 97760 Orthotics management and training, 97760 Splinting (initial encounter), [phone] Subsequent splinting/medication, scar mobilization, compression bandaging, Dry needling, energy conservation, coping strategies training, patient/family education, and DME and/or AE instructions  CONSULTED AND AGREED WITH PLAN OF CARE: Patient  PLAN FOR NEXT SESSION:   Continue on with manual therapy and modalities as helpful for pain relief but also continue to promote isometric gripping as well as finger ability and motion as well as fine motor skills and isometric strength at the wrist.  Fannie Knee, OTR/L, CHT 05/18/2023, 6:01 PM

## 2023-05-18 NOTE — Progress Notes (Signed)
   Philip Winters - 62 y.o. male MRN [id number]  Date of birth: 1960-09-12  Office Visit Note: Visit Date: 05/18/2023 PCP: Etta Grandchild, MD Referred by: Etta Grandchild, MD  Subjective:  HPI: Philip Winters is a 62 y.o. male who presents today for follow up 9 weeks status post left wrist proximal row carpectomy with placement allograft space/ thumb cmc denervation.  He is having some ongoing discomfort and swelling in this region, has been try to utilize the wrist for more and more activities.  He is currently doing therapy as instructed.  Pertinent ROS were reviewed with the patient and found to be negative unless otherwise specified above in HPI.   Assessment & Plan: Visit Diagnoses:  1. Slac (scapholunate advanced collapse) of wrist, left     Plan: He is doing well overall, despite some ongoing swelling and stiffness in this region.  I did emphasize the significance of his surgery once again as well as the normal postoperative timeframe.  He should continue with range of motion exercise with progression of strengthening with therapy at this point.  I emphasized that it would likely be 3 to 6 months before we see significant improvement from a utilization standpoint given his aggressive goals and demand.  He will return to me in approximate 6 weeks time for recheck.  Follow-up: No follow-ups on file.   Meds & Orders: No orders of the defined types were placed in this encounter.   Orders Placed This Encounter  Procedures   XR Wrist Complete Left     Procedures: No procedures performed       Objective:   Vital Signs: There were no vitals taken for this visit.  Ortho Exam Left wrist - Well-healed dorsal incision, well-healed thumb CMC incision without erythema or drainage - Wrist range of motion flexion 10 degrees, extension 5 degrees, slightly improved passively - Composite fist without significant restriction, sensation intact diffusely, hand is warm well-perfused - Grip  strength Jamar 2 right 65, left 10  Imaging: XR Wrist Complete Left Result Date: 05/18/2023 X-rays of the left wrist, multi views were obtained today X-rays demonstrate stable appearance of the radiocarpal joint status post proximal row carpectomy, capitate is well located at the lunate facet in both planes.    Sadaf Przybysz Trevor Mace, M.D. [facility] 10:20 AM

## 2023-05-21 DIAGNOSIS — K080 Exfoliation of teeth due to systemic causes: Secondary | ICD-10-CM | POA: Diagnosis not present

## 2023-05-21 NOTE — Therapy (Signed)
 OUTPATIENT OCCUPATIONAL THERAPY TREATMENT & PROGRESS NOTE  Patient Name: Philip Winters MRN: [id number] DOB:[birth year], 62 y.o., male Today's Date: 05/25/2023  PCP: Joshua Ned, MD REFERRING PROVIDER:  Arlinda Buster, MD       Progress Note  Reporting Period 04/20/23 to 05/25/23   05/24/22: Over the past 2 weeks he has been having relatively high pain reports and pushing himself too hard, too fast, and OT feels that he discharged his orthosis too soon.  Due to these things he has been a bit flared up.  He has been faithful to do his exercises and stretches, and now he is also resting a bit more, taking a day off every other day to allow rest.  This has helped and his pain is down somewhat.  OT does caution him to avoid any heavy lifting pushing or pulling.  New isometric strengthening is going well and he is getting stronger.  OT continually tries to emphasize that this is a marathon and not a sprint and that he needs to allow additional time for rest and focus on the light strengthening and mobility as a priority.  As not all goals are met yet, OT will request additional therapy visits and time to help him achieve all goals and get back to full healthy lifestyle.  PLAN:  OT FREQUENCY: 1-2x/week  OT DURATION: Up to 6 additional weeks from 05/25/23 -07/17/2023 and up to 14 total visits as needed  See note below for Objective Data and Assessment of Progress/Goals.        END OF SESSION:  OT End of Session - 05/25/23 1348     Visit Number 8    Number of Visits 14    Date for OT Re-Evaluation 07/17/23    Authorization Type [employer] Medicare    OT Start Time 1348    OT Stop Time 1433    OT Time Calculation (min) 45 min    Activity Tolerance Patient tolerated treatment well;No increased pain;Patient limited by fatigue;Patient limited by pain    Behavior During Therapy [facility] for tasks assessed/performed               Past Medical History:  Diagnosis Date   Allergy    Arthritis     CAD (coronary artery disease)    GERD (gastroesophageal reflux disease)    occ   Heart murmur    baby   High cholesterol    Hypertension    Peripheral neuropathy    Pneumonia    Right carotid bruit 06/10/2017   Sciatica    Seasonal allergies    Sinus tachycardia    Spinal stenosis    Past Surgical History:  Procedure Laterality Date   CARDIAC CATHETERIZATION     CARPAL TUNNEL WITH CUBITAL TUNNEL Left 11/10/2013   Procedure: LEFT CARPAL TUNNEL RELEASE;  Surgeon: Lamar Leonor Mickey LULLA, MD;  Location: [facility];  Service: Orthopedics;  Laterality: Left;   CARPECTOMY Left 03/13/2023   Procedure: LEFT WRIST PROXIMAL ROW CARPECTOMY WITH PLACEMENT ALLOGRAFT SPACER / THUMB CMC DENERVATION;  Surgeon: Arlinda Buster, MD;  Location: [facility];  Service: Orthopedics;  Laterality: Left;   COLONOSCOPY  2023   CORONARY ARTERY BYPASS GRAFT N/A 05/01/2020   Procedure: CORONARY ARTERY BYPASS GRAFTING (CABG) X 2 ON CARDIOPULMONARY BYPASS. LIMA TO LAD, SVG TO DIAG;  Surgeon: Fleeta Hanford Coy, MD;  Location: [facility] OR;  Service: Open Heart Surgery;  Laterality: N/A;   ENDOVEIN HARVEST OF GREATER SAPHENOUS VEIN Right 05/01/2020  Procedure: ENDOVEIN HARVEST OF GREATER SAPHENOUS VEIN;  Surgeon: Fleeta Hanford Coy, MD;  Location: [facility] OR;  Service: Open Heart Surgery;  Laterality: Right;   KNEE ARTHROSCOPY  [id number]   left and right   LUMBAR LAMINECTOMY/DECOMPRESSION MICRODISCECTOMY Left 09/29/2017   Procedure: LEFT LUMBAR TWO - LUMBAR THREELAMINOTOMY/MICRODISCECTOMY;  Surgeon: Alix Charleston, MD;  Location: [facility] OR;  Service: Neurosurgery;  Laterality: Left;  LEFT LUMBAR 2- LUMBAR 3 LAMINOTOMY/MICRODISCECTOMY   NASAL SINUS SURGERY  05/2015   RIGHT/LEFT HEART CATH AND CORONARY ANGIOGRAPHY N/A 04/10/2020   Procedure: RIGHT/LEFT HEART CATH AND CORONARY ANGIOGRAPHY;  Surgeon: Burnard Debby LABOR, MD;  Location: [facility];  Service: Cardiovascular;  Laterality: N/A;   TEE  WITHOUT CARDIOVERSION N/A 05/01/2020   Procedure: TRANSESOPHAGEAL ECHOCARDIOGRAM (TEE);  Surgeon: Fleeta Hanford, Coy, MD;  Location: [facility] OR;  Service: Open Heart Surgery;  Laterality: N/A;   TOTAL KNEE ARTHROPLASTY Right 06/24/2016   Procedure: TOTAL KNEE ARTHROPLASTY;  Surgeon: Anderson Coy ORN, MD;  Location: [facility] OR;  Service: Orthopedics;  Laterality: Right;   TOTAL KNEE ARTHROPLASTY Left 12/18/2020   Procedure: LEFT TOTAL KNEE ARTHROPLASTY;  Surgeon: Anderson Coy ORN, MD;  Location: [facility];  Service: Orthopedics;  Laterality: Left;   TRANSFORAMINAL LUMBAR INTERBODY FUSION W/ MIS 2 LEVEL Left 04/15/2022   Procedure: Minimally Invasive Surgery Decompression and Transforaminal Lumbar Interbody Fusion , Left , Lumbar three-four, Lumbar four-five;  Surgeon: Dawley, Lani BROCKS, DO;  Location: [facility] OR;  Service: Neurosurgery;  Laterality: Left;   TRIGGER FINGER RELEASE Left 11/10/2013   Procedure: LEFT INDEX A-1 PULLEY RELEASE;  Surgeon: Charleston Leonor Mickey LULLA, MD;  Location: [facility];  Service: Orthopedics;  Laterality: Left;   ULNAR NERVE TRANSPOSITION Left 11/10/2013   Procedure: ULNAR NERVE TRANSPOSITION;  Surgeon: Charleston Leonor Mickey LULLA, MD;  Location: 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) 09/22/2022   Encounter for general adult medical examination with abnormal findings 09/22/2022   Encounter for screening for HIV 09/18/2022   Need for hepatitis C screening test 09/18/2022   Numbness in feet 09/18/2022   Tachycardia 09/18/2022   Dyslipidemia, goal LDL below 70 09/18/2022   Idiopathic small fiber peripheral neuropathy 09/18/2022   Chronic hyperglycemia 09/18/2022   Lumbar spinal stenosis 04/15/2022   Chronic pain of left  thumb 02/25/2022   SBE (subacute bacterial endocarditis) prophylaxis candidate 12/16/2021   Cervical radiculopathy 08/13/2021   Ulnar neuropathy 08/13/2021   Screen for colon cancer 06/06/2021   Chronic tachycardia 06/04/2021   Candidal balanitis 06/04/2021   Carpal tunnel syndrome, bilateral upper limbs 09/26/2020   Cubital tunnel syndrome on left 08/23/2020   Pulmonary nodule 1 cm or greater in diameter 04/26/2020   Statin intolerance 04/20/2020   Coronary atherosclerosis of native coronary artery 04/18/2020   Nonrheumatic aortic valve stenosis    Bicuspid aortic valve 04/06/2020   Primary osteoarthritis of left knee 11/08/2019   Bilateral primary osteoarthritis of knee 09/28/2019   HNP (herniated nucleus pulposus), lumbar 09/29/2017   Pain in left wrist 10/09/2016   Unilateral primary osteoarthritis, right knee 06/24/2016   Primary hypertension 01/18/2007    ONSET DATE: DOS 03/13/23  REFERRING DIAG: F80.867 (ICD-10-CM) - [place] (scapholunate advanced collapse) of wrist, left   THERAPY DIAG:  Pain in  left wrist  Stiffness of left wrist, not elsewhere classified  Pain in left hand  Localized edema  Muscle weakness (generalized)  Other lack of coordination  Rationale for Evaluation and Treatment: Rehabilitation  PERTINENT HISTORY: Was seen in hand therapy in 2023 for thumb fx as well as trigger finger, but he only needed to attended 2 sessions.   He has a long history of arthritis in both hands and wrists and he states that he was unable to make a complete fist even before the surgery.  He also states having some pain now postop, just had a cast removed today and that his stitches were taken out 2 to 3 weeks ago.  He is currently has Steri-Strips on.  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  He states his thumb now has some tingling/burning sensation which may indicate nerves waking up.  He also states stretching his hand every day but with holding certain wrist exercises for every other day to allow his wrist to rest.  Though he is doing that, he also states trying to lift and carry things and push himself a bit much.  He also has not been wearing his orthosis for several weeks now, and is not wearing any kind of bracing now.   PAIN:  Are you having pain? Yes: NPRS scale: 5-6/10, mainly stiff Pain location: Lt wrist Pain description: aching, worse at night Aggravating factors: Intermittent swelling and any attempted weightbearing Relieving factors: Rest, elevation  PATIENT GOALS: Improve the use of his left nondominant side  NEXT MD VISIT: 05/18/2023   OBJECTIVE: (All objective assessments below are from initial evaluation on: 04/20/23 unless otherwise specified.)    HAND DOMINANCE: Right   ADLs: Overall ADLs: States decreased ability to grab, hold household objects, pain and difficulty to open containers, perform FMS tasks (manipulate fasteners on clothing), mild to moderate bathing problems as well.    FUNCTIONAL OUTCOME MEASURES: 05/24/22: 27% Quick DASH today.   04/22/23:  Quick DASH 35% impairment today  (Higher % Score  =  More Impairment)     UPPER EXTREMITY ROM     Shoulder to Wrist AROM Left eval Lt 05/25/23  Forearm supination 68 72  Forearm pronation  75 83  Wrist flexion 21 14 (21* after DN today)   Wrist extension 11 27  Wrist ulnar deviation 7 15  Wrist radial deviation 5 1  Functional dart thrower's motion (F-DTM) in ulnar flexion    F-DTM in radial extension     (Blank rows = not tested)   Hand AROM Left eval Lt 05/25/23  Full Fist Ability (or Gap to Distal Palmar Crease) 4.9cm gap from tip of MF to [facility] 2.8cm gap from tip of MF to [facility]  Thumb Opposition   (Kapandji Scale)  4/10 5/10 (is 5/10 in Rt hand)   (Blank rows = not tested)   UPPER EXTREMITY MMT:     MMT Left 05/25/23  Elbow flexion 4+/5  Elbow extension 4/5  Forearm supination 4+/5  Forearm pronation 4+/5  Wrist flexion 4+/5  Wrist extension 4-/5 tender  Wrist ulnar deviation 5/5  Wrist radial deviation 4/5 tender  (Blank rows = not tested)  HAND FUNCTION: 05/25/23: Grip strength left hand 12.6 pounds   04/29/23:  Grip strength Right: 61 lbs, Left: 11 lbs    COORDINATION: 05/25/23: BBT: 44 blocks today (54 is WFL)  04/22/23: Box and Blocks Test: 28 Blocks today (54 is WFL)  SENSATION: 05/25/2023: He states his thumb is buzzing and tingling and it seems like some of his nerves are waking up  (he states this area was completely numb before)  Eval:  States light touch intact today, even into thumb dorsum and around sx area, though diminished somewhat around sx area    EDEMA:   05/08/23: 20.8cm circumferentially around distal Lt wrist crease (compared to 16.9cm Rt side)   04/27/23: edema today: 20cm circumferentially around distal Lt wrist crease (compared to 16.9cm Rt side)   OBSERVATIONS:   05/24/22: Much less tenderness to palpation, though some still present on the ulnar aspect of the wrist and the dorsum of the wrist.  Less red less swollen appearing generally less irritable now that he has been trying to rest more.   TODAY'S TREATMENT:  05/24/22:  Pt performs AROM, gripping, and strength with left hand and arm against therapist's resistance for exercise/activities as well as new measures today. OT also discusses home and functional tasks with the pt and reviews goals.  OT reminds him for self-care/safety that he should not be lifting pushing or pulling anything heavy while he is in an irritated or inflamed state.  Again OT recommends that he wear bracing more often, as he is not really wearing it at all now.  OT also reviews that good outcomes for the surgery in terms of  motion are about 50% of normal range of motion , and he is almost there and wrist extension now.  He was told to never force motion and that if he is sore and tender and swollen he needs to rest more and protect himself more.    Otherwise, he is moving better and appears to be stronger in the arm, isometrically, during manual muscle testing.  His entire home exercise program was reviewed including isometrics at the forearm and wrist as well as biceps and triceps to strengthen-though this should not be done painfully.  His stretches were also briefly reviewed as he is well familiar with these.  He states that he still gets a benefit from therapy and he would like to continue on several more sessions to work on mobility and strengthening still.  Additionally, he request manual therapy dry needling that he has had in the past as it seemed to improve his motion and decrease his stiffness.   Trigger Point Dry Needling  Subsequent Treatment: Instructions provided previously at initial dry needling treatment.   Patient Verbal Consent Given: Yes Education Handout Provided: Previously Provided Muscles Treated: ECRB ECU, and extensor wad of the left wrist/forearm Electrical Stimulation Performed: No Treatment Response/Outcome: He had increased range of motion and felt looser after dry needling (see measures above), no negative effects like bruising or bleeding or lingering pain   Exercises reviewed today: - Forearm Supination Stretch  - 3-4 x daily - 3-5 reps - 15 sec hold - Forearm Pronation Stretch  - 3-4 x daily - 3-5 reps - 15 sec hold - Wrist Flexion Stretch  - 4 x daily - 3-5 reps - 15 sec hold - Wrist Extension Stretch Pronated  - 4 x daily - 3-5 reps - 15 hold - SPATULA stretch   - 4-6 x daily - 3-5 reps - 15 sec hold - BACK KNUCKLE STRETCHES   - 4 x daily -  3-5 reps - 15 sec hold - HOOK Stretch  - 4 x daily - 3-5 reps - 15-20 sec hold - Seated Finger Composite Flexion Stretch  - 4 x  daily - 3-5 reps - 15 hold - PUSH KNUCKLES DOWN  - 4 x daily - 3-5 reps - 15 seconds hold - Bend and Pull Back Wrist SLOWLY  - 4 x daily - 10-15 reps - Windshield Wipers   - 4 x daily - 10-15 reps - Tendon Glides  - 4-6 x daily - 3-5 reps - 2-3 seconds hold - Full Fist  - 2-3 x daily - 3-5 x weekly - 5 reps - 10 sec hold - Thumb Opposition with Putty  - 2-3 x daily - 3-5 x weekly - 5 reps - Finger Extension with Putty  - 4-6 x daily - 1 sets - 10 reps - 5 sec hold - Towel Roll Grip with Forearm in Neutral  - 3 x daily - 5 reps - 10 sec hold - Seated Isometric Wrist training in 4 planes of motion (also including triceps and biceps)- 1-3 x daily - 1-2 sets - 5-10reps, 5-10 sec holds  PATIENT EDUCATION: Education details: See tx section above for details  Person educated: Patient Education method: Engineer, Structural, Teach back, Handouts  Education comprehension: States and demonstrates understanding, Additional Education required    HOME EXERCISE PROGRAM: Access Code: [email or web address] URL: [email or web address] Date: 04/20/2023 Prepared by: Melvenia Ada   GOALS: Goals reviewed with patient? Yes   SHORT TERM GOALS: (STG required if POC>30 days) Target Date: 05/15/23  Pt will obtain protective, custom orthotic. Goal status: 04/20/23 MET   2.  Pt will demo/state understanding of initial HEP to improve pain levels and prerequisite motion. Goal status: 04/22/23: MET   LONG TERM GOALS: Target Date: 06/05/23  Pt will improve functional ability by decreased impairment per Quick DASH assessment from 35% to 20% or better, for better quality of life. Goal status: 05/25/23: Partially Met (down to 27% now)   2.  Pt will improve grip strength in Lt hand from unsafe to test to at least 40lbs for functional use at home and in IADLs. Goal status: 05/25/23: Improving to12# now   3.  Pt will improve A/ROM in Lt wrist flex/ext from 21/11 to at least 30/25* to have better functional  motion for tasks like reach and grasp.  Goal status: 05/25/23: Improving, now 14*/ 27*   4.  Pt will improve strength in left forearm and wrist from unsafe to test  to at least 4+/5 MMT to have increased functional ability to carry out selfcare and higher-level homecare tasks with less difficulty. Goal status: 05/25/23: Partially Met-wrist extension and radial deviation are still mildly weak and tender  5.  Pt will improve coordination skills in left arm, as seen by within functional limit score (54) on box and blocks testing to have increased functional ability to carry out fine motor tasks (fasteners, etc.) and more complex, coordinated IADLs (meal prep, sports, etc.).  Goal status: 05/25/23: Improved to 44  6.  Pt will decrease pain at rest from 5-6/10 to 1/10 or better to have better sleep and occupational participation in daily roles. Goal status: 05/25/23: Not significantly improved as he has not been resting or wearing his bracing at all recently  ASSESSMENT:  CLINICAL IMPRESSION: 05/24/22: Over the past 2 weeks he has been having relatively high pain reports and pushing himself too hard, too fast, and OT feels that he discharged his orthosis  too soon.  Due to these things he has been a bit flared up.  He has been faithful to do his exercises and stretches, and now he is also resting a bit more, taking a day off every other day to allow rest.  This has helped and his pain is down somewhat.  OT does caution him to avoid any heavy lifting pushing or pulling.  New isometric strengthening is going well and he is getting stronger.  OT continually tries to emphasize that this is a marathon and not a sprint and that he needs to allow additional time for rest and focus on the light strengthening and mobility as a priority.  As not all goals are met yet, OT will request additional therapy visits and time to help him achieve all goals and get back to full healthy lifestyle.   PLAN:  OT FREQUENCY:  1-2x/week  OT DURATION: Up to 6 additional weeks from 05/25/23 -07/17/2023 and up to 14 total visits as needed  PLANNED INTERVENTIONS: 97168 OT Re-evaluation, 97535 self care/ADL training, 02889 therapeutic exercise, 97530 therapeutic activity, 97112 neuromuscular re-education, 97140 manual therapy, 97035 ultrasound, 97039 fluidotherapy, 97010 moist heat, 97010 cryotherapy, 97034 contrast bath, 97760 Orthotics management and training, [id number] Splinting (initial encounter), [id number] Subsequent splinting/medication, scar mobilization, compression bandaging, Dry needling, energy conservation, coping strategies training, patient/family education, and DME and/or AE instructions  CONSULTED AND AGREED WITH PLAN OF CARE: Patient  PLAN FOR NEXT SESSION:   Continue on with manual therapy and modalities as helpful for pain relief but also continue to promote light strengthening, functional activities as well as full fist ability, start to work into more progressive resistance as tolerated as long as pain is low (<4/10)  Melvenia Ada, OTR/L, CHT 05/25/2023, 6:30 PM

## 2023-05-25 ENCOUNTER — Encounter: Payer: Self-pay | Admitting: Rehabilitative and Restorative Service Providers"

## 2023-05-25 ENCOUNTER — Ambulatory Visit: Payer: Medicare Other | Admitting: Rehabilitative and Restorative Service Providers"

## 2023-05-25 DIAGNOSIS — M25632 Stiffness of left wrist, not elsewhere classified: Secondary | ICD-10-CM

## 2023-05-25 DIAGNOSIS — M79642 Pain in left hand: Secondary | ICD-10-CM

## 2023-05-25 DIAGNOSIS — R600 Localized edema: Secondary | ICD-10-CM

## 2023-05-25 DIAGNOSIS — R278 Other lack of coordination: Secondary | ICD-10-CM

## 2023-05-25 DIAGNOSIS — M25532 Pain in left wrist: Secondary | ICD-10-CM | POA: Diagnosis not present

## 2023-05-25 DIAGNOSIS — M6281 Muscle weakness (generalized): Secondary | ICD-10-CM

## 2023-05-25 NOTE — Patient Instructions (Signed)

## 2023-05-27 DIAGNOSIS — L309 Dermatitis, unspecified: Secondary | ICD-10-CM | POA: Diagnosis not present

## 2023-05-28 NOTE — Therapy (Signed)
 OUTPATIENT OCCUPATIONAL THERAPY TREATMENT NOTE  Patient Name: Philip Winters MRN: [id number] DOB:1960/09/14, 63 y.o., male Today's Date: 06/01/2023  PCP: Joshua Ned, MD REFERRING PROVIDER:  Arlinda Buster, MD    END OF SESSION:  OT End of Session - 06/01/23 1150     Visit Number 9    Number of Visits 14    Date for OT Re-Evaluation 07/17/23    Authorization Type BCBS Medicare    OT Start Time 1149    OT Stop Time 1233    OT Time Calculation (min) 44 min    Activity Tolerance Patient tolerated treatment well;No increased pain;Patient limited by fatigue;Patient limited by pain    Behavior During Therapy [facility] for tasks assessed/performed                Past Medical History:  Diagnosis Date   Allergy    Arthritis    CAD (coronary artery disease)    GERD (gastroesophageal reflux disease)    occ   Heart murmur    baby   High cholesterol    Hypertension    Peripheral neuropathy    Pneumonia    Right carotid bruit 06/10/2017   Sciatica    Seasonal allergies    Sinus tachycardia    Spinal stenosis    Past Surgical History:  Procedure Laterality Date   CARDIAC CATHETERIZATION     CARPAL TUNNEL WITH CUBITAL TUNNEL Left 11/10/2013   Procedure: LEFT CARPAL TUNNEL RELEASE;  Surgeon: Lamar Leonor Mickey LULLA, MD;  Location: [facility];  Service: Orthopedics;  Laterality: Left;   CARPECTOMY Left 03/13/2023   Procedure: LEFT WRIST PROXIMAL ROW CARPECTOMY WITH PLACEMENT ALLOGRAFT SPACER / THUMB CMC DENERVATION;  Surgeon: Arlinda Buster, MD;  Location: [facility];  Service: Orthopedics;  Laterality: Left;   COLONOSCOPY  2023   CORONARY ARTERY BYPASS GRAFT N/A 05/01/2020   Procedure: CORONARY ARTERY BYPASS GRAFTING (CABG) X 2 ON CARDIOPULMONARY BYPASS. LIMA TO LAD, SVG TO DIAG;  Surgeon: Fleeta Hanford Coy, MD;  Location: [facility] OR;  Service: Open Heart Surgery;  Laterality: N/A;   ENDOVEIN HARVEST OF GREATER SAPHENOUS VEIN Right 05/01/2020   Procedure:  ENDOVEIN HARVEST OF GREATER SAPHENOUS VEIN;  Surgeon: Fleeta Hanford Coy, MD;  Location: [facility] OR;  Service: Open Heart Surgery;  Laterality: Right;   KNEE ARTHROSCOPY  8014,8001   left and right   LUMBAR LAMINECTOMY/DECOMPRESSION MICRODISCECTOMY Left 09/29/2017   Procedure: LEFT LUMBAR TWO - LUMBAR THREELAMINOTOMY/MICRODISCECTOMY;  Surgeon: Alix Lamar, MD;  Location: [facility] OR;  Service: Neurosurgery;  Laterality: Left;  LEFT LUMBAR 2- LUMBAR 3 LAMINOTOMY/MICRODISCECTOMY   NASAL SINUS SURGERY  05/2015   RIGHT/LEFT HEART CATH AND CORONARY ANGIOGRAPHY N/A 04/10/2020   Procedure: RIGHT/LEFT HEART CATH AND CORONARY ANGIOGRAPHY;  Surgeon: Burnard Ned LABOR, MD;  Location: [facility];  Service: Cardiovascular;  Laterality: N/A;   TEE WITHOUT CARDIOVERSION N/A 05/01/2020   Procedure: TRANSESOPHAGEAL ECHOCARDIOGRAM (TEE);  Surgeon: Fleeta Hanford, Coy, MD;  Location: [facility] OR;  Service: Open Heart Surgery;  Laterality: N/A;   TOTAL KNEE ARTHROPLASTY Right 06/24/2016   Procedure: TOTAL KNEE ARTHROPLASTY;  Surgeon: Anderson Coy ORN, MD;  Location: [facility] OR;  Service: Orthopedics;  Laterality: Right;   TOTAL KNEE ARTHROPLASTY Left 12/18/2020   Procedure: LEFT TOTAL KNEE ARTHROPLASTY;  Surgeon: Anderson Coy ORN, MD;  Location: [facility];  Service: Orthopedics;  Laterality: Left;   TRANSFORAMINAL LUMBAR INTERBODY FUSION W/ MIS 2 LEVEL Left 04/15/2022   Procedure: Minimally Invasive Surgery Decompression and  Transforaminal Lumbar Interbody Fusion , Left , Lumbar three-four, Lumbar four-five;  Surgeon: Dawley, Lani BROCKS, DO;  Location: [facility] OR;  Service: Neurosurgery;  Laterality: Left;   TRIGGER FINGER RELEASE Left 11/10/2013   Procedure: LEFT INDEX A-1 PULLEY RELEASE;  Surgeon: Lamar Leonor Mickey LULLA, MD;  Location: [facility];  Service: Orthopedics;  Laterality: Left;   ULNAR NERVE TRANSPOSITION Left 11/10/2013   Procedure: ULNAR NERVE TRANSPOSITION;  Surgeon: Lamar Leonor Mickey LULLA, MD;  Location: [facility];  Service: Orthopedics;  Laterality: Left;   Patient Active Problem List   Diagnosis Date Noted   Scapholunate advanced collapse of left wrist 03/13/2023   Arthritis of carpometacarpal [facility]) joint of left thumb 03/13/2023   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) 09/22/2022   Encounter for general adult medical examination with abnormal findings 09/22/2022   Encounter for screening for HIV 09/18/2022   Need for hepatitis C screening test 09/18/2022   Numbness in feet 09/18/2022   Tachycardia 09/18/2022   Dyslipidemia, goal LDL below 70 09/18/2022   Idiopathic small fiber peripheral neuropathy 09/18/2022   Chronic hyperglycemia 09/18/2022   Lumbar spinal stenosis 04/15/2022   Chronic pain of left thumb 02/25/2022   SBE (subacute bacterial endocarditis) prophylaxis candidate 12/16/2021   Cervical radiculopathy 08/13/2021   Ulnar neuropathy 08/13/2021   Screen for colon cancer 06/06/2021   Chronic tachycardia 06/04/2021   Candidal balanitis 06/04/2021   Carpal tunnel syndrome, bilateral upper limbs 09/26/2020   Cubital tunnel syndrome on left 08/23/2020   Pulmonary nodule 1 cm or greater in diameter 04/26/2020   Statin intolerance 04/20/2020   Coronary atherosclerosis of native coronary artery 04/18/2020   Nonrheumatic aortic valve stenosis    Bicuspid aortic valve 04/06/2020   Primary osteoarthritis of left knee 11/08/2019   Bilateral primary osteoarthritis of knee 09/28/2019   HNP (herniated nucleus pulposus), lumbar 09/29/2017   Pain in left wrist 10/09/2016   Unilateral primary osteoarthritis, right knee 06/24/2016   Primary hypertension 01/18/2007    ONSET DATE: DOS 03/13/23  REFERRING DIAG: F80.867 (ICD-10-CM) - [place] (scapholunate advanced collapse) of wrist, left   THERAPY DIAG:  Pain in left wrist  Stiffness of left wrist, not elsewhere classified  Pain in left hand  Localized edema  Muscle weakness (generalized)  Other lack of  coordination  Rationale for Evaluation and Treatment: Rehabilitation  PERTINENT HISTORY: Was seen in hand therapy in 2023 for thumb fx as well as trigger finger, but he only needed to attended 2 sessions.   He has a long history of arthritis in both hands and wrists and he states that he was unable to make a complete fist even before the surgery.  He also states having some pain now postop, just had a cast removed today and that his stitches were taken out 2 to 3 weeks ago.  He is currently has Steri-Strips on.  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  He states having a little less pain in taking it easy, but still feeling stiff and generally unhappy with his surgical outcomes.  He does state that his function is much better and he is getting by.    PAIN:  Are you having pain? Yes: NPRS scale: 4-5/10 Pain location: Lt  wrist Pain description: aching, worse at night Aggravating factors: Intermittent swelling and any attempted weightbearing Relieving factors: Rest, elevation  PATIENT GOALS: Improve the use of his left nondominant side  NEXT MD VISIT: 05/18/2023   OBJECTIVE: (All objective assessments below are from initial evaluation on: 04/20/23 unless otherwise specified.)    HAND DOMINANCE: Right   ADLs: Overall ADLs: States decreased ability to grab, hold household objects, pain and difficulty to open containers, perform FMS tasks (manipulate fasteners on clothing), mild to moderate bathing problems as well.    FUNCTIONAL OUTCOME MEASURES: 05/24/22: 27% Quick DASH today.   04/22/23:  Quick DASH 35% impairment today  (Higher % Score  =  More Impairment)     UPPER EXTREMITY ROM     Shoulder to Wrist AROM  Left eval Lt 05/25/23 Lt 05/22/23  Forearm supination 68 72 76  Forearm pronation  75 83 80  Wrist flexion 21 14 (21* after DN today)  22  Wrist extension 11 27 11   Wrist ulnar deviation 7 15   Wrist radial deviation 5 1   Functional dart thrower's motion (F-DTM) in ulnar flexion     F-DTM in radial extension      (Blank rows = not tested)   Hand AROM Left eval Lt 05/25/23 Lt 06/01/23  Full Fist Ability (or Gap to Distal Palmar Crease) 4.9cm gap from tip of MF to [facility] 2.8cm gap from tip of MF to [facility] 2.5 cm gap today  Thumb Opposition  (Kapandji Scale)  4/10 5/10 (is 5/10 in Rt hand)    (Blank rows = not tested)   UPPER EXTREMITY MMT:     MMT Left 05/25/23  Elbow flexion 4+/5  Elbow extension 4/5  Forearm supination 4+/5  Forearm pronation 4+/5  Wrist flexion 4+/5  Wrist extension 4-/5 tender  Wrist ulnar deviation 5/5  Wrist radial deviation 4/5 tender  (Blank rows = not tested)  HAND FUNCTION: 05/25/23: Grip strength left hand 12.6 pounds   04/29/23: Grip strength Right: 61 lbs, Left: 11 lbs    COORDINATION: 05/25/23: BBT: 44 blocks today (54 is WFL)  04/22/23: Box and Blocks Test: 28 Blocks today (54 is WFL)  SENSATION: 05/25/2023: He states his thumb is buzzing and tingling and it seems like some of his nerves are waking up  (he states this area was completely numb before)  Eval:  States light touch intact today, even into thumb dorsum and around sx area, though diminished somewhat around sx area    EDEMA:   05/08/23: 20.8cm circumferentially around distal Lt wrist crease (compared to 16.9cm Rt side)   04/27/23: edema today: 20cm circumferentially around distal Lt wrist crease (compared to 16.9cm Rt side)   OBSERVATIONS:   05/24/22: Much less tenderness to palpation, though some still present on the ulnar aspect of the wrist and the dorsum of the wrist.  Less red less swollen appearing generally less irritable now that he has been trying to rest more.   TODAY'S  TREATMENT:  06/01/23: OT starts his wrist on moist heat today while reviewing his home exercise programs.  He states moist heat feels good and helps his arm loosen up as well as fingers.  OT encourages him to do less forceful stretching and activities on the wrist but more so on the fingers.  We are encouraged that his swelling appears to be down finally in his wrist is no longer red and less swollen looking.  OT does manual therapy IASTM around his thumb and forearm  and wrist volarly and dorsally also providing concurrent wrist flexion and extension stretches as well as some IASTM through the dorsum of the hand and fingers with concurrent finger stretches (composite flexion).  He states his wrist is somewhat sore after the stretches and that it does not feel much looser, though his fingers feel somewhat looser.  Next, OT educates him on new proprioceptive activities to perform at the wrist to help him regulate motion and coordination at his new wrist joint after proximal row carpectomy.  These things are all light or no weight and he performs the following:  ball bounce/catch Wrist circumduction holding a ball in pronation Perturbations gently while holding dowel Oscillations with flexbar Bilateral balance activity with a ball on a trampoline   These things were not painful for him but they were fatiguing to the wrist joint and difficult.  He was also asked to perform fine motor skills activities is much as possible at home. He leaves stating understanding new neuromuscular reeducation activities as well as continuing to rest and avoid heavy pushing pulling or grabbing for now.  Lastly OT does remind him that most postsurgical protocols described patient's as being not fully satisfied with her surgery until 6 to 12 months postop.  So his current feelings are not totally atypical for this surgery type    PATIENT EDUCATION: Education details: See tx section above for details  Person educated:  Patient Education method: Verbal Instruction, Teach back, Handouts  Education comprehension: States and demonstrates understanding, Additional Education required    HOME EXERCISE PROGRAM: Access Code: [email or web address] URL: [email or web address] Date: 04/20/2023 Prepared by: Melvenia Ada   GOALS: Goals reviewed with patient? Yes   SHORT TERM GOALS: (STG required if POC>30 days) Target Date: 05/15/23  Pt will obtain protective, custom orthotic. Goal status: 04/20/23 MET   2.  Pt will demo/state understanding of initial HEP to improve pain levels and prerequisite motion. Goal status: 04/22/23: MET   LONG TERM GOALS: Target Date: 06/05/23  Pt will improve functional ability by decreased impairment per Quick DASH assessment from 35% to 20% or better, for better quality of life. Goal status: 05/25/23: Partially Met (down to 27% now)   2.  Pt will improve grip strength in Lt hand from unsafe to test to at least 40lbs for functional use at home and in IADLs. Goal status: 05/25/23: Improving to12# now   3.  Pt will improve A/ROM in Lt wrist flex/ext from 21/11 to at least 30/25* to have better functional motion for tasks like reach and grasp.  Goal status: 05/25/23: Improving, now 14*/ 27*   4.  Pt will improve strength in left forearm and wrist from unsafe to test  to at least 4+/5 MMT to have increased functional ability to carry out selfcare and higher-level homecare tasks with less difficulty. Goal status: 05/25/23: Partially Met-wrist extension and radial deviation are still mildly weak and tender  5.  Pt will improve coordination skills in left arm, as seen by within functional limit score (54) on box and blocks testing to have increased functional ability to carry out fine motor tasks (fasteners, etc.) and more complex, coordinated IADLs (meal prep, sports, etc.).  Goal status: 05/25/23: Improved to 44  6.  Pt will decrease pain at rest from 5-6/10 to 1/10 or better to have  better sleep and occupational participation in daily roles. Goal status: 05/25/23: Not significantly improved as he has not been resting or wearing his bracing at all recently  ASSESSMENT:  CLINICAL  IMPRESSION: 06/01/23: He continues to be frustrated, though he was educated that  not feeling satisfied is typical until 6 to 12 months postop.  He is continually encouraged to keep resistance low and now start doing proprioceptive wrist training versus adding heavy weighted activities.  Once this skill set is improving hopefully his flexibility and his pain and his tolerance to resistance will also improve.  PLAN:  OT FREQUENCY: 1-2x/week  OT DURATION: Up to 6 additional weeks from 05/25/23 -07/17/2023 and up to 14 total visits as needed  PLANNED INTERVENTIONS: 97168 OT Re-evaluation, 97535 self care/ADL training, 02889 therapeutic exercise, 97530 therapeutic activity, 97112 neuromuscular re-education, 97140 manual therapy, 97035 ultrasound, 97039 fluidotherapy, 97010 moist heat, 97010 cryotherapy, 97034 contrast bath, 97760 Orthotics management and training, 02239 Splinting (initial encounter), [id number] Subsequent splinting/medication, scar mobilization, compression bandaging, Dry needling, energy conservation, coping strategies training, patient/family education, and DME and/or AE instructions  CONSULTED AND AGREED WITH PLAN OF CARE: Patient  PLAN FOR NEXT SESSION:   Review new proprioceptive wrist activities as helpful, continue manual therapy though he was cautioned that the wrist and focus more so on finger motion and ability.  Melvenia Ada, OTR/L, CHT 06/01/2023, 6:01 PM

## 2023-06-01 ENCOUNTER — Encounter: Payer: Self-pay | Admitting: Rehabilitative and Restorative Service Providers"

## 2023-06-01 ENCOUNTER — Ambulatory Visit: Payer: Medicare Other | Admitting: Rehabilitative and Restorative Service Providers"

## 2023-06-01 DIAGNOSIS — M79642 Pain in left hand: Secondary | ICD-10-CM | POA: Diagnosis not present

## 2023-06-01 DIAGNOSIS — R600 Localized edema: Secondary | ICD-10-CM

## 2023-06-01 DIAGNOSIS — M25632 Stiffness of left wrist, not elsewhere classified: Secondary | ICD-10-CM | POA: Diagnosis not present

## 2023-06-01 DIAGNOSIS — R278 Other lack of coordination: Secondary | ICD-10-CM

## 2023-06-01 DIAGNOSIS — M25532 Pain in left wrist: Secondary | ICD-10-CM | POA: Diagnosis not present

## 2023-06-01 DIAGNOSIS — M6281 Muscle weakness (generalized): Secondary | ICD-10-CM

## 2023-06-08 ENCOUNTER — Encounter: Payer: Medicare Other | Admitting: Rehabilitative and Restorative Service Providers"

## 2023-06-11 NOTE — Therapy (Addendum)
 OUTPATIENT OCCUPATIONAL THERAPY TREATMENT & DISCHARGE NOTE  Patient Name: Philip Winters MRN: [id number] DOB:[birth year], 63 y.o., male Today's Date: 06/15/2023  PCP: Joshua Ned, MD REFERRING PROVIDER:  Arlinda Buster, MD                      OCCUPATIONAL THERAPY DISCHARGE SUMMARY  Visits from Start of Care: 10  Pt did not return to OT and goals could not be finalized.  She is officially D/C therapy today per protocols. See note for additional details.   Melvenia Ada, OTR/L, CHT 03/18/24               END OF SESSION:  OT End of Session - 06/15/23 1148     Visit Number 10    Number of Visits 14    Date for OT Re-Evaluation 07/17/23    Authorization Type BCBS Medicare    OT Start Time 1149    OT Stop Time 1235    OT Time Calculation (min) 46 min    Activity Tolerance Patient tolerated treatment well;No increased pain;Patient limited by fatigue;Patient limited by pain    Behavior During Therapy [facility] for tasks assessed/performed              Past Medical History:  Diagnosis Date   Allergy    Arthritis    CAD (coronary artery disease)    GERD (gastroesophageal reflux disease)    occ   Heart murmur    baby   High cholesterol    Hypertension    Peripheral neuropathy    Pneumonia    Right carotid bruit 06/10/2017   Sciatica    Seasonal allergies    Sinus tachycardia    Spinal stenosis    Past Surgical History:  Procedure Laterality Date   CARDIAC CATHETERIZATION     CARPAL TUNNEL WITH CUBITAL TUNNEL Left 11/10/2013   Procedure: LEFT CARPAL TUNNEL RELEASE;  Surgeon: Lamar Leonor Mickey LULLA, MD;  Location: [facility];  Service: Orthopedics;  Laterality: Left;   CARPECTOMY Left 03/13/2023   Procedure: LEFT WRIST PROXIMAL ROW CARPECTOMY WITH PLACEMENT ALLOGRAFT SPACER / THUMB CMC DENERVATION;  Surgeon: Arlinda Buster, MD;  Location: [facility];  Service: Orthopedics;  Laterality: Left;   COLONOSCOPY   2023   CORONARY ARTERY BYPASS GRAFT N/A 05/01/2020   Procedure: CORONARY ARTERY BYPASS GRAFTING (CABG) X 2 ON CARDIOPULMONARY BYPASS. LIMA TO LAD, SVG TO DIAG;  Surgeon: Fleeta Hanford Coy, MD;  Location: [facility] OR;  Service: Open Heart Surgery;  Laterality: N/A;   ENDOVEIN HARVEST OF GREATER SAPHENOUS VEIN Right 05/01/2020   Procedure: ENDOVEIN HARVEST OF GREATER SAPHENOUS VEIN;  Surgeon: Fleeta Hanford Coy, MD;  Location: [facility] OR;  Service: Open Heart Surgery;  Laterality: Right;   KNEE ARTHROSCOPY  [id number]   left and right   LUMBAR LAMINECTOMY/DECOMPRESSION MICRODISCECTOMY Left 09/29/2017   Procedure: LEFT LUMBAR TWO - LUMBAR THREELAMINOTOMY/MICRODISCECTOMY;  Surgeon: Alix Lamar, MD;  Location: [facility] OR;  Service: Neurosurgery;  Laterality: Left;  LEFT LUMBAR 2- LUMBAR 3 LAMINOTOMY/MICRODISCECTOMY   NASAL SINUS SURGERY  05/2015   RIGHT/LEFT HEART CATH AND CORONARY ANGIOGRAPHY N/A 04/10/2020   Procedure: RIGHT/LEFT HEART CATH AND CORONARY ANGIOGRAPHY;  Surgeon: Burnard Ned LABOR, MD;  Location: [facility];  Service: Cardiovascular;  Laterality: N/A;   TEE WITHOUT CARDIOVERSION N/A 05/01/2020   Procedure: TRANSESOPHAGEAL ECHOCARDIOGRAM (TEE);  Surgeon: Fleeta Hanford, Coy, MD;  Location: [facility] OR;  Service: Open Heart Surgery;  Laterality: N/A;  TOTAL KNEE ARTHROPLASTY Right 06/24/2016   Procedure: TOTAL KNEE ARTHROPLASTY;  Surgeon: Anderson Maude ORN, MD;  Location: [facility] OR;  Service: Orthopedics;  Laterality: Right;   TOTAL KNEE ARTHROPLASTY Left 12/18/2020   Procedure: LEFT TOTAL KNEE ARTHROPLASTY;  Surgeon: Anderson Maude ORN, MD;  Location: [facility]S;  Service: Orthopedics;  Laterality: Left;   TRANSFORAMINAL LUMBAR INTERBODY FUSION W/ MIS 2 LEVEL Left 04/15/2022   Procedure: Minimally Invasive Surgery Decompression and Transforaminal Lumbar Interbody Fusion , Left , Lumbar three-four, Lumbar four-five;  Surgeon: Dawley, Lani BROCKS, DO;  Location: [facility] OR;  Service: Neurosurgery;  Laterality: Left;    TRIGGER FINGER RELEASE Left 11/10/2013   Procedure: LEFT INDEX A-1 PULLEY RELEASE;  Surgeon: Lamar Leonor Mickey LULLA, MD;  Location: [facility];  Service: Orthopedics;  Laterality: Left;   ULNAR NERVE TRANSPOSITION Left 11/10/2013   Procedure: ULNAR NERVE TRANSPOSITION;  Surgeon: Lamar Leonor Mickey LULLA, MD;  Location: [facility];  Service: Orthopedics;  Laterality: Left;   Patient Active Problem List   Diagnosis Date Noted   Scapholunate advanced collapse of left wrist 03/13/2023   Arthritis of carpometacarpal [facility]) joint of left thumb 03/13/2023   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) 09/22/2022   Encounter for general adult medical examination with abnormal findings 09/22/2022   Encounter for screening for HIV 09/18/2022   Need for hepatitis C screening test 09/18/2022   Numbness in feet 09/18/2022   Tachycardia 09/18/2022   Dyslipidemia, goal LDL below 70 09/18/2022   Idiopathic small fiber peripheral neuropathy 09/18/2022   Chronic hyperglycemia 09/18/2022   Lumbar spinal stenosis 04/15/2022   Chronic pain of left thumb 02/25/2022   SBE (subacute bacterial endocarditis) prophylaxis candidate 12/16/2021   Cervical radiculopathy 08/13/2021   Ulnar neuropathy 08/13/2021   Screen for colon cancer 06/06/2021   Chronic tachycardia 06/04/2021   Candidal balanitis 06/04/2021   Carpal tunnel syndrome, bilateral upper limbs 09/26/2020   Cubital tunnel syndrome on left 08/23/2020   Pulmonary nodule 1 cm or greater in diameter 04/26/2020   Statin intolerance 04/20/2020   Coronary atherosclerosis of native coronary artery 04/18/2020   Nonrheumatic aortic valve stenosis    Bicuspid aortic valve 04/06/2020   Primary osteoarthritis of left knee 11/08/2019   Bilateral primary osteoarthritis of knee 09/28/2019   HNP (herniated nucleus pulposus), lumbar 09/29/2017   Pain in left wrist 10/09/2016   Unilateral primary osteoarthritis, right knee 06/24/2016    Primary hypertension 01/18/2007    ONSET DATE: DOS 03/13/23  REFERRING DIAG: F80.867 (ICD-10-CM) - [place] (scapholunate advanced collapse) of wrist, left   THERAPY DIAG:  Pain in left wrist  Pain in left hand  Localized edema  Stiffness of left wrist, not elsewhere classified  Muscle weakness (generalized)  Other lack of coordination  Rationale for Evaluation and Treatment: Rehabilitation  PERTINENT HISTORY: Was seen in hand therapy in 2023 for thumb fx as well as trigger finger, but he only needed to attended 2 sessions.   He has a long history of arthritis in both hands and wrists and he states that he was unable to make a complete fist even before the surgery.  He also states having some pain now postop, just had a cast removed today and that his stitches were taken out 2 to 3 weeks ago.  He is currently has Steri-Strips on.  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  He states I threw my back out last week and was not able to come to therapy.  He states that his wrist is only mildly painful now around the ulnar side as well as through the index finger and middle fingers dorsally.  He feels like some of this is arthritis pain through the finger joints.  He does state that he is having a bit more activity with the hand and arm at home now.   PAIN:  Are you having pain? Yes: NPRS scale:  3-4/10 Pain location: Lt IF, MF Pain description: aching, worse at night Aggravating factors: Intermittent swelling and any attempted weightbearing Relieving factors: Rest, elevation  PATIENT GOALS: Improve the use of his left nondominant side  NEXT MD VISIT: 05/18/2023   OBJECTIVE: (All objective assessments below are from initial  evaluation on: 04/20/23 unless otherwise specified.)    HAND DOMINANCE: Right   ADLs: Overall ADLs: States decreased ability to grab, hold household objects, pain and difficulty to open containers, perform FMS tasks (manipulate fasteners on clothing), mild to moderate bathing problems as well.    FUNCTIONAL OUTCOME MEASURES: 05/24/22: 27% Quick DASH today.   04/22/23:  Quick DASH 35% impairment today  (Higher % Score  =  More Impairment)     UPPER EXTREMITY ROM     Shoulder to Wrist AROM Left eval Lt 05/25/23 Lt 06/01/23 Lt 06/15/23  Forearm supination 68 72 76   Forearm pronation  75 83 80   Wrist flexion 21 14 (21* after DN today)  22 20 (20 after manual)  Wrist extension 11 27 11 16  (19 after manual)  Wrist ulnar deviation 7 15    Wrist radial deviation 5 1    Functional dart thrower's motion (F-DTM) in ulnar flexion      F-DTM in radial extension       (Blank rows = not tested)   Hand AROM Left eval Lt 05/25/23 Lt 06/01/23 Lt 06/15/23  Full Fist Ability (or Gap to Distal Palmar Crease) 4.9cm gap from tip of MF to [facility] 2.8cm gap from tip of MF to [facility] 2.5 cm gap today 2.5cm gap from top MF to [facility]  Thumb Opposition  (Kapandji Scale)  4/10 5/10 (is 5/10 in Rt hand)   5/10 (bil)  (Blank rows = not tested)   UPPER EXTREMITY MMT:     MMT Left 05/25/23 Lt 06/15/23  Elbow flexion 4+/5   Elbow extension 4/5   Forearm supination 4+/5   Forearm pronation 4+/5   Wrist flexion 4+/5   Wrist extension 4-/5 tender 4+/5 non-tender  Wrist ulnar deviation 5/5   Wrist radial deviation 4/5 tender 4+/5  (Blank rows = not tested)  HAND FUNCTION: 06/15/23: 17.6# Lt grip strength today (19.7# after cupping manual therapy)   05/25/23: Grip strength left hand 12.6 pounds   04/29/23: Grip strength Right: 61 lbs, Left: 11 lbs    COORDINATION: 05/25/23: BBT: 44 blocks today (54 is WFL)  04/22/23: Box and Blocks Test: 28 Blocks today (54 is WFL)  SENSATION: 05/25/2023: He states his thumb is  buzzing and tingling and it seems like some of his nerves are waking up  (he states this area was completely numb before)  Eval:  States light touch intact today, even into thumb dorsum and around sx area, though diminished somewhat around sx area    EDEMA:  05/08/23: 20.8cm circumferentially around distal Lt wrist crease (compared to 16.9cm Rt side)   04/27/23: edema today: 20cm circumferentially around distal Lt wrist crease (compared to 16.9cm Rt side)   OBSERVATIONS:   05/24/22: Much less tenderness to palpation, though some still present on the ulnar aspect of the wrist and the dorsum of the wrist.  Less red less swollen appearing generally less irritable now that he has been trying to rest more.   TODAY'S TREATMENT:  06/15/23: He starts with performing active range of motion for exercises within the fluidotherapy machine for heat as well as desensitization for 5 minutes.  He states this helps him feel a bit looser.  OT then does manual therapy scar mobilizations with cupping technique as well as educating the patient to do this himself with Kinesiotape and providing him with Kinesiotape.   His grip strength and wrist motion improves after cupping technique.   Additionally, OT does kinesiotaping to help pull the scar line and potentially help with edema through the wrist.  OT also applies a star pattern around the ulnar side of the wrist to give some support and bring some healing blood flow there.  He states this does feel supportive, but he was advised to take it off after 2 days or if it causes any skin irritation.  Additionally, OT fabricates him compressive wrist strap which he should be wearing when outside of his orthosis for more rigid support.  He wears this today to perform some advanced strengthening techniques.  These are listed below:  Wrist flexion and wrist extension with a 2 to 3 pound weight x 10-15 reps Forearm rotation with a 1 pound mallet x 15 reps Bicep curls with  bilateral 4 pound dumbbells x 15 reps Tricep push with rope on cable machine 15 pounds x 15 reps Farmers carry with 10 pounds x 120 feet  He had no pain with any of these strengthening activities or exercises, and was shown how to progress in these carefully without exacerbation.  He was asked to incorporate these type of progressive resistive exercises into his weekly exercise regimen at least 3-4 times a week, and also to focus on functional mobility through stretching and functional activities.  He states understanding and leaves in no significant pain    PATIENT EDUCATION: Education details: See tx section above for details  Person educated: Patient Education method: Verbal Instruction, Teach back, Handouts  Education comprehension: States and demonstrates understanding, Additional Education required    HOME EXERCISE PROGRAM: Access Code: [email or web address] URL: [email or web address] Date: 04/20/2023 Prepared by: Melvenia Ada   GOALS: Goals reviewed with patient? Yes   SHORT TERM GOALS: (STG required if POC>30 days) Target Date: 05/15/23  Pt will obtain protective, custom orthotic. Goal status: 04/20/23 MET   2.  Pt will demo/state understanding of initial HEP to improve pain levels and prerequisite motion. Goal status: 04/22/23: MET   LONG TERM GOALS: Target Date: 07/17/23  Pt will improve functional ability by decreased impairment per Quick DASH assessment from 35% to 20% or better, for better quality of life. Goal status: 05/25/23: Partially Met (down to 27% now)   2.  Pt will improve grip strength in Lt hand from unsafe to test to at least 40lbs for functional use at home and in IADLs. Goal status: 05/25/23: Improving to12# now   3.  Pt will improve A/ROM in Lt wrist flex/ext from 21/11 to at least 30/25* to have better functional motion for tasks like reach and grasp.  Goal  status: 05/25/23: Improving, now 14*/ 27*   4.  Pt will improve strength in left forearm  and wrist from unsafe to test  to at least 4+/5 MMT to have increased functional ability to carry out selfcare and higher-level homecare tasks with less difficulty. Goal status: 05/25/23: Partially Met-wrist extension and radial deviation are still mildly weak and tender  5.  Pt will improve coordination skills in left arm, as seen by within functional limit score (54) on box and blocks testing to have increased functional ability to carry out fine motor tasks (fasteners, etc.) and more complex, coordinated IADLs (meal prep, sports, etc.).  Goal status: 05/25/23: Improved to 44  6.  Pt will decrease pain at rest from 5-6/10 to 1/10 or better to have better sleep and occupational participation in daily roles. Goal status: 05/25/23: Not significantly improved as he has not been resting or wearing his bracing at all recently  ASSESSMENT:  CLINICAL IMPRESSION: 06/15/23: He is now tolerating a bit of progressive resistive exercise for the wrist, hand, forearm as well as proximal arm.  We will continue to work these as we continue to address motion mainly in the fingers.  For the wrist we can continue manual therapy and support but we will not force any motion there as it has been painful and swollen   PLAN:  OT FREQUENCY: 1-2x/week  OT DURATION: Up to 6 additional weeks from 05/25/23 -07/17/2023 and up to 14 total visits as needed  PLANNED INTERVENTIONS: 97168 OT Re-evaluation, 97535 self care/ADL training, 02889 therapeutic exercise, 97530 therapeutic activity, 97112 neuromuscular re-education, 97140 manual therapy, 97035 ultrasound, 97039 fluidotherapy, 97010 moist heat, 97010 cryotherapy, 97034 contrast bath, 97760 Orthotics management and training, [id number] Splinting (initial encounter), [id number] Subsequent splinting/medication, scar mobilization, compression bandaging, Dry needling, energy conservation, coping strategies training, patient/family education, and DME and/or AE instructions  CONSULTED AND AGREED  WITH PLAN OF CARE: Patient  PLAN FOR NEXT SESSION:   Continue progressive resistive exercises, emphasizing finger motion and thumb motion, use compressive strap as helpful, continue manual therapy as helpful.  Melvenia Ada, OTR/L, CHT 06/15/2023, 6:19 PM

## 2023-06-15 ENCOUNTER — Encounter: Payer: Self-pay | Admitting: Rehabilitative and Restorative Service Providers"

## 2023-06-15 ENCOUNTER — Ambulatory Visit: Payer: Medicare Other | Admitting: Rehabilitative and Restorative Service Providers"

## 2023-06-15 DIAGNOSIS — R600 Localized edema: Secondary | ICD-10-CM

## 2023-06-15 DIAGNOSIS — M25632 Stiffness of left wrist, not elsewhere classified: Secondary | ICD-10-CM | POA: Diagnosis not present

## 2023-06-15 DIAGNOSIS — M79642 Pain in left hand: Secondary | ICD-10-CM

## 2023-06-15 DIAGNOSIS — M6281 Muscle weakness (generalized): Secondary | ICD-10-CM

## 2023-06-15 DIAGNOSIS — R278 Other lack of coordination: Secondary | ICD-10-CM

## 2023-06-15 DIAGNOSIS — M25532 Pain in left wrist: Secondary | ICD-10-CM | POA: Diagnosis not present

## 2023-06-17 DIAGNOSIS — K080 Exfoliation of teeth due to systemic causes: Secondary | ICD-10-CM | POA: Diagnosis not present

## 2023-06-17 NOTE — Therapy (Incomplete)
OUTPATIENT OCCUPATIONAL THERAPY TREATMENT NOTE  Patient Name: Philip Winters MRN: [id number] DOB:September 04, 1960, 63 y.o., male Today's Date: 06/17/2023  PCP: Sanda Linger, MD REFERRING PROVIDER:  Samuella Cota, MD    END OF SESSION:     Past Medical History:  Diagnosis Date   Allergy    Arthritis    CAD (coronary artery disease)    GERD (gastroesophageal reflux disease)    occ   Heart murmur    baby   High cholesterol    Hypertension    Peripheral neuropathy    Pneumonia    Right carotid bruit 06/10/2017   Sciatica    Seasonal allergies    Sinus tachycardia    Spinal stenosis    Past Surgical History:  Procedure Laterality Date   CARDIAC CATHETERIZATION     CARPAL TUNNEL WITH CUBITAL TUNNEL Left 11/10/2013   Procedure: LEFT CARPAL TUNNEL RELEASE;  Surgeon: Wyn Forster, MD;  Location: [facility];  Service: Orthopedics;  Laterality: Left;   CARPECTOMY Left 03/13/2023   Procedure: LEFT WRIST PROXIMAL ROW CARPECTOMY WITH PLACEMENT ALLOGRAFT SPACER / THUMB CMC DENERVATION;  Surgeon: Samuella Cota, MD;  Location: [facility];  Service: Orthopedics;  Laterality: Left;   COLONOSCOPY  2023   CORONARY ARTERY BYPASS GRAFT N/A 05/01/2020   Procedure: CORONARY ARTERY BYPASS GRAFTING (CABG) X 2 ON CARDIOPULMONARY BYPASS. LIMA TO LAD, SVG TO DIAG;  Surgeon: Kerin Perna, MD;  Location: [facility] OR;  Service: Open Heart Surgery;  Laterality: N/A;   ENDOVEIN HARVEST OF GREATER SAPHENOUS VEIN Right 05/01/2020   Procedure: ENDOVEIN HARVEST OF GREATER SAPHENOUS VEIN;  Surgeon: Kerin Perna, MD;  Location: [facility] OR;  Service: Open Heart Surgery;  Laterality: Right;   KNEE ARTHROSCOPY  [id number]   left and right   LUMBAR LAMINECTOMY/DECOMPRESSION MICRODISCECTOMY Left 09/29/2017   Procedure: LEFT LUMBAR TWO - LUMBAR THREELAMINOTOMY/MICRODISCECTOMY;  Surgeon: Shirlean Kelly, MD;  Location: [facility] OR;  Service: Neurosurgery;  Laterality: Left;  LEFT LUMBAR 2-  LUMBAR 3 LAMINOTOMY/MICRODISCECTOMY   NASAL SINUS SURGERY  05/2015   RIGHT/LEFT HEART CATH AND CORONARY ANGIOGRAPHY N/A 04/10/2020   Procedure: RIGHT/LEFT HEART CATH AND CORONARY ANGIOGRAPHY;  Surgeon: Lennette Bihari, MD;  Location: [facility];  Service: Cardiovascular;  Laterality: N/A;   TEE WITHOUT CARDIOVERSION N/A 05/01/2020   Procedure: TRANSESOPHAGEAL ECHOCARDIOGRAM (TEE);  Surgeon: Donata Clay, Theron Arista, MD;  Location: [facility] OR;  Service: Open Heart Surgery;  Laterality: N/A;   TOTAL KNEE ARTHROPLASTY Right 06/24/2016   Procedure: TOTAL KNEE ARTHROPLASTY;  Surgeon: Valeria Batman, MD;  Location: [facility] OR;  Service: Orthopedics;  Laterality: Right;   TOTAL KNEE ARTHROPLASTY Left 12/18/2020   Procedure: LEFT TOTAL KNEE ARTHROPLASTY;  Surgeon: Valeria Batman, MD;  Location: [facility];  Service: Orthopedics;  Laterality: Left;   TRANSFORAMINAL LUMBAR INTERBODY FUSION W/ MIS 2 LEVEL Left 04/15/2022   Procedure: Minimally Invasive Surgery Decompression and Transforaminal Lumbar Interbody Fusion , Left , Lumbar three-four, Lumbar four-five;  Surgeon: Dawley, Alan Mulder, DO;  Location: [facility] OR;  Service: Neurosurgery;  Laterality: Left;   TRIGGER FINGER RELEASE Left 11/10/2013   Procedure: LEFT INDEX A-1 PULLEY RELEASE;  Surgeon: Wyn Forster, MD;  Location: [facility];  Service: Orthopedics;  Laterality: Left;   ULNAR NERVE TRANSPOSITION Left 11/10/2013   Procedure: ULNAR NERVE TRANSPOSITION;  Surgeon: Wyn Forster, MD;  Location: [facility];  Service: Orthopedics;  Laterality: Left;   Patient Active Problem  List   Diagnosis Date Noted   Scapholunate advanced collapse of left wrist 03/13/2023   Arthritis of carpometacarpal [facility]) joint of left thumb 03/13/2023   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) 09/22/2022   Encounter for general adult medical examination with abnormal findings 09/22/2022   Encounter for screening for HIV 09/18/2022    Need for hepatitis C screening test 09/18/2022   Numbness in feet 09/18/2022   Tachycardia 09/18/2022   Dyslipidemia, goal LDL below 70 09/18/2022   Idiopathic small fiber peripheral neuropathy 09/18/2022   Chronic hyperglycemia 09/18/2022   Lumbar spinal stenosis 04/15/2022   Chronic pain of left thumb 02/25/2022   SBE (subacute bacterial endocarditis) prophylaxis candidate 12/16/2021   Cervical radiculopathy 08/13/2021   Ulnar neuropathy 08/13/2021   Screen for colon cancer 06/06/2021   Chronic tachycardia 06/04/2021   Candidal balanitis 06/04/2021   Carpal tunnel syndrome, bilateral upper limbs 09/26/2020   Cubital tunnel syndrome on left 08/23/2020   Pulmonary nodule 1 cm or greater in diameter 04/26/2020   Statin intolerance 04/20/2020   Coronary atherosclerosis of native coronary artery 04/18/2020   Nonrheumatic aortic valve stenosis    Bicuspid aortic valve 04/06/2020   Primary osteoarthritis of left knee 11/08/2019   Bilateral primary osteoarthritis of knee 09/28/2019   HNP (herniated nucleus pulposus), lumbar 09/29/2017   Pain in left wrist 10/09/2016   Unilateral primary osteoarthritis, right knee 06/24/2016   Primary hypertension 01/18/2007    ONSET DATE: DOS 03/13/23  REFERRING DIAG: A63.016 (ICD-10-CM) - [place] (scapholunate advanced collapse) of wrist, left   THERAPY DIAG:  No diagnosis found.  Rationale for Evaluation and Treatment: Rehabilitation  PERTINENT HISTORY: Was seen in hand therapy in 2023 for thumb fx as well as trigger finger, but he only needed to attended 2 sessions.   He has a long history of arthritis in both hands and wrists and he states that he was unable to make a complete fist even before the surgery.  He also states having some pain now postop, just had a cast removed today and that his stitches were taken out 2 to 3 weeks ago.  He is currently has Steri-Strips on.  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  He states ***    PAIN:  Are you having pain? Yes: NPRS scale:  *** 3-4/10 Pain location: Lt IF, MF Pain description: aching, worse at night Aggravating factors: Intermittent swelling and any attempted weightbearing Relieving factors: Rest, elevation  PATIENT GOALS: Improve the use of his left nondominant side  NEXT MD VISIT: 05/18/2023   OBJECTIVE: (All objective assessments below are from initial evaluation on: 04/20/23 unless otherwise specified.)    HAND DOMINANCE: Right   ADLs: Overall ADLs: States decreased ability to grab, hold household objects, pain and difficulty to open containers, perform FMS tasks (manipulate fasteners on clothing), mild to moderate bathing problems as well.    FUNCTIONAL OUTCOME MEASURES: 05/24/22: 27% Quick DASH today.   04/22/23:  Quick DASH 35% impairment today  (Higher % Score  =  More Impairment)     UPPER EXTREMITY ROM     Shoulder to Wrist AROM Left eval Lt 05/25/23 Lt 06/01/23 Lt 06/15/23  Forearm supination 68 72 76   Forearm pronation  75 83  80   Wrist flexion 21 14 (21* after DN today)  22 20 (20 after manual)  Wrist extension 11 27 11 16  (19 after manual)  Wrist ulnar deviation 7 15    Wrist radial deviation 5 1    Functional dart thrower's motion (F-DTM) in ulnar flexion      F-DTM in radial extension       (Blank rows = not tested)   Hand AROM Left eval Lt 05/25/23 Lt 06/01/23 Lt 06/15/23 Lt 06/22/23  Full Fist Ability (or Gap to Distal Palmar Crease) 4.9cm gap from tip of MF to [facility] 2.8cm gap from tip of MF to [facility] 2.5 cm gap today 2.5cm gap from top MF to [facility] ***  Thumb Opposition  (Kapandji Scale)  4/10 5/10 (is 5/10 in Rt hand)   5/10 (bil)   (Blank rows = not tested)   UPPER EXTREMITY MMT:      MMT Left 05/25/23 Lt 06/15/23  Elbow flexion 4+/5   Elbow extension 4/5   Forearm supination 4+/5   Forearm pronation 4+/5   Wrist flexion 4+/5   Wrist extension 4-/5 tender 4+/5 non-tender  Wrist ulnar deviation 5/5   Wrist radial deviation 4/5 tender 4+/5  (Blank rows = not tested)  HAND FUNCTION: 06/15/23: 17.6# Lt grip strength today (19.7# after cupping manual therapy)   05/25/23: Grip strength left hand 12.6 pounds   04/29/23: Grip strength Right: 61 lbs, Left: 11 lbs    COORDINATION: 05/25/23: BBT: 44 blocks today (54 is WFL)  04/22/23: Box and Blocks Test: 28 Blocks today (54 is WFL)  SENSATION: 05/25/2023: He states his thumb is buzzing and tingling and it seems like some of his nerves are "waking up"  (he states this area was completely numb before)  Eval:  States light touch intact today, even into thumb dorsum and around sx area, though diminished somewhat around sx area    EDEMA:   05/08/23: 20.8cm circumferentially around distal Lt wrist crease (compared to 16.9cm Rt side)   04/27/23: edema today: 20cm circumferentially around distal Lt wrist crease (compared to 16.9cm Rt side)   OBSERVATIONS:   05/24/22: Much less tenderness to palpation, though some still present on the ulnar aspect of the wrist and the dorsum of the wrist.  Less red less swollen appearing generally less irritable now that he has been trying to rest more.   TODAY'S TREATMENT:  06/22/23: *** Continue progressive resistive exercises, emphasizing finger motion and thumb motion, use compressive strap as helpful, continue manual therapy as helpful.   06/15/23: He starts with performing active range of motion for exercises within the fluidotherapy machine for heat as well as desensitization for 5 minutes.  He states this helps him feel a bit looser.  OT then does manual therapy scar mobilizations with cupping technique as well as educating the patient to do this himself with Kinesiotape and providing him  with Kinesiotape.   His grip strength and wrist motion improves after cupping technique.   Additionally, OT does kinesiotaping to help pull the scar line and potentially help with edema through the wrist.  OT also applies a "star pattern" around the ulnar side of the wrist to give some support and bring some healing blood flow there.  He states this does feel supportive, but he was advised to take it off after 2 days or if it causes any skin irritation.  Additionally, OT fabricates him compressive wrist strap which he should be wearing when outside of his orthosis  for more rigid support.  He wears this today to perform some advanced strengthening techniques.  These are listed below:  Wrist flexion and wrist extension with a 2 to 3 pound weight x 10-15 reps Forearm rotation with a 1 pound mallet x 15 reps Bicep curls with bilateral 4 pound dumbbells x 15 reps Tricep push with rope on cable machine 15 pounds x 15 reps Farmers carry with 10 pounds x 120 feet  He had no pain with any of these strengthening activities or exercises, and was shown how to progress in these carefully without exacerbation.  He was asked to incorporate these type of progressive resistive exercises into his weekly exercise regimen at least 3-4 times a week, and also to focus on functional mobility through stretching and functional activities.  He states understanding and leaves in no significant pain    PATIENT EDUCATION: Education details: See tx section above for details  Person educated: Patient Education method: Verbal Instruction, Teach back, Handouts  Education comprehension: States and demonstrates understanding, Additional Education required    HOME EXERCISE PROGRAM: Access Code: [email or web address] URL: [email or web address] Date: 04/20/2023 Prepared by: Fannie Knee   GOALS: Goals reviewed with patient? Yes   SHORT TERM GOALS: (STG required if POC>30 days) Target Date: 05/15/23  Pt will obtain  protective, custom orthotic. Goal status: 04/20/23 MET   2.  Pt will demo/state understanding of initial HEP to improve pain levels and prerequisite motion. Goal status: 04/22/23: MET   LONG TERM GOALS: Target Date: 07/17/23  Pt will improve functional ability by decreased impairment per Quick DASH assessment from 35% to 20% or better, for better quality of life. Goal status: 05/25/23: Partially Met (down to 27% now)   2.  Pt will improve grip strength in Lt hand from unsafe to test to at least 40lbs for functional use at home and in IADLs. Goal status: 05/25/23: Improving to12# now   3.  Pt will improve A/ROM in Lt wrist flex/ext from 21/11 to at least 30/25* to have better functional motion for tasks like reach and grasp.  Goal status: 05/25/23: Improving, now 14*/ 27*   4.  Pt will improve strength in left forearm and wrist from unsafe to test  to at least 4+/5 MMT to have increased functional ability to carry out selfcare and higher-level homecare tasks with less difficulty. Goal status: 05/25/23: Partially Met-wrist extension and radial deviation are still mildly weak and tender  5.  Pt will improve coordination skills in left arm, as seen by within functional limit score (54) on box and blocks testing to have increased functional ability to carry out fine motor tasks (fasteners, etc.) and more complex, coordinated IADLs (meal prep, sports, etc.).  Goal status: 05/25/23: Improved to 44  6.  Pt will decrease pain at rest from 5-6/10 to 1/10 or better to have better sleep and occupational participation in daily roles. Goal status: 05/25/23: Not significantly improved as he has not been resting or wearing his bracing at all recently  ASSESSMENT:  CLINICAL IMPRESSION: 06/22/23: ***  06/15/23: He is now tolerating a bit of progressive resistive exercise for the wrist, hand, forearm as well as proximal arm.  We will continue to work these as we continue to address motion mainly in the fingers.  For the  wrist we can continue manual therapy and support but we will not force any motion there as it has been painful and swollen   PLAN:  OT FREQUENCY: 1-2x/week  OT DURATION: Up to 6  additional weeks from 05/25/23 -07/17/2023 and up to 14 total visits as needed  PLANNED INTERVENTIONS: 97168 OT Re-evaluation, 97535 self care/ADL training, 40981 therapeutic exercise, 97530 therapeutic activity, 97112 neuromuscular re-education, 97140 manual therapy, 97035 ultrasound, 97039 fluidotherapy, 97010 moist heat, 97010 cryotherapy, 97034 contrast bath, 97760 Orthotics management and training, 19147 Splinting (initial encounter), [id number] Subsequent splinting/medication, scar mobilization, compression bandaging, Dry needling, energy conservation, coping strategies training, patient/family education, and DME and/or AE instructions  CONSULTED AND AGREED WITH PLAN OF CARE: Patient  PLAN FOR NEXT SESSION:   ***  Fannie Knee, OTR/L, CHT 06/17/2023, 2:12 PM

## 2023-06-22 ENCOUNTER — Encounter: Payer: Medicare Other | Admitting: Rehabilitative and Restorative Service Providers"

## 2023-06-29 ENCOUNTER — Ambulatory Visit: Payer: Medicare Other | Admitting: Orthopedic Surgery

## 2023-06-29 ENCOUNTER — Other Ambulatory Visit: Payer: Self-pay

## 2023-06-29 DIAGNOSIS — M19132 Post-traumatic osteoarthritis, left wrist: Secondary | ICD-10-CM

## 2023-06-29 NOTE — Progress Notes (Signed)
   Philip Winters - 63 y.o. male MRN [id number]  Date of birth: Jul 03, 1960  Office Visit Note: Visit Date: 06/29/2023 PCP: [name], MD Referred by: [name], MD  Subjective:  HPI: Philip Winters is a 63 y.o. male who presents today for follow up 15 weeks status post left wrist proximal row carpectomy with placement allograft spacer, thumb cmc denervation.  He is progressing from a range of motion and strengthening standpoint.  Pertinent ROS were reviewed with the patient and found to be negative unless otherwise specified above in HPI.   Assessment & Plan: Visit Diagnoses: No diagnosis found.  Plan: He continues to demonstrate appropriate progress at the wrist from both a functional and range of motion standpoint.  He has been discharged from occupational therapy at this time, continue with home exercise as instructed.  Follow-up with myself in approximate 2 months for a recheck.  Follow-up: No follow-ups on file.   Meds & Orders: No orders of the defined types were placed in this encounter.  No orders of the defined types were placed in this encounter.    Procedures: No procedures performed       Objective:   Vital Signs: There were no vitals taken for this visit.  Ortho Exam Left wrist: - Well-healed dorsal incision over the wrist and thumb glabrous/nonglabrous junction - Wrist range of motion flexion 25, extension 15 - Grip strength Jamar 2 right 80, left 25  Imaging: No results found.   Tonita Bills Alvia Jointer, M.D. [facility] 10:11 AM

## 2023-06-30 ENCOUNTER — Ambulatory Visit (INDEPENDENT_AMBULATORY_CARE_PROVIDER_SITE_OTHER): Payer: Medicare Other

## 2023-06-30 DIAGNOSIS — I350 Nonrheumatic aortic (valve) stenosis: Secondary | ICD-10-CM

## 2023-06-30 LAB — ECHOCARDIOGRAM COMPLETE
AR max vel: 0.6 cm2
AV Area VTI: 0.49 cm2
AV Area mean vel: 0.56 cm2
AV Mean grad: 26 mm[Hg]
AV Peak grad: 45.4 mm[Hg]
AV Vena cont: 0.53 cm
Ao pk vel: 3.37 m/s
Area-P 1/2: 4.36 cm2
P 1/2 time: 281 ms
S' Lateral: 2.95 cm

## 2023-07-03 ENCOUNTER — Encounter: Payer: Self-pay | Admitting: Cardiology

## 2023-07-10 ENCOUNTER — Other Ambulatory Visit: Payer: Self-pay | Admitting: *Deleted

## 2023-07-10 ENCOUNTER — Encounter: Payer: Self-pay | Admitting: Cardiology

## 2023-07-10 DIAGNOSIS — Q2381 Bicuspid aortic valve: Secondary | ICD-10-CM

## 2023-07-16 ENCOUNTER — Other Ambulatory Visit: Payer: Self-pay | Admitting: Physician Assistant

## 2023-07-16 ENCOUNTER — Ambulatory Visit: Payer: Medicare Other | Attending: Cardiovascular Disease | Admitting: Cardiovascular Disease

## 2023-07-16 ENCOUNTER — Encounter: Payer: Self-pay | Admitting: Cardiovascular Disease

## 2023-07-16 VITALS — BP 130/82 | HR 116 | Ht 68.0 in | Wt 148.2 lb

## 2023-07-16 DIAGNOSIS — Z01810 Encounter for preprocedural cardiovascular examination: Secondary | ICD-10-CM | POA: Diagnosis not present

## 2023-07-16 DIAGNOSIS — I350 Nonrheumatic aortic (valve) stenosis: Secondary | ICD-10-CM

## 2023-07-16 MED ORDER — DIPHENHYDRAMINE HCL 50 MG PO TABS: ORAL_TABLET | ORAL | 0 refills | Status: DC

## 2023-07-16 MED ORDER — PREDNISONE 50 MG PO TABS: ORAL_TABLET | ORAL | 0 refills | Status: DC

## 2023-07-16 MED ORDER — OLMESARTAN MEDOXOMIL 5 MG PO TABS: 10.0000 mg | ORAL_TABLET | Freq: Every morning | ORAL | 3 refills | Status: DC

## 2023-07-16 NOTE — Patient Instructions (Addendum)
 Lab Work: CBC, BMET today/this week If you have labs (blood work) drawn today and your tests are completely normal, you will receive your results only by: MyChart Message (if you have MyChart) OR A paper copy in the mail If you have any lab test that is abnormal or we need to change your treatment, we will call you to review the results.  Testing/Procedures: Left heart catheterization Your physician has requested that you have a cardiac catheterization. Cardiac catheterization is used to diagnose and/or treat various heart conditions. Doctors may recommend this procedure for a number of different reasons. The most common reason is to evaluate chest pain. Chest pain can be a symptom of coronary artery disease (CAD), and cardiac catheterization can show whether plaque is narrowing or blocking your heart's arteries. This procedure is also used to evaluate the valves, as well as measure the blood flow and oxygen levels in different parts of your heart. For further information please visit [email or web address] Please follow instruction sheet, as given.  TAVR CT's and surgical evaluation (you will be called to schedule)  Follow-Up: At [facility], you and your health needs are our priority.  As part of our continuing mission to provide you with exceptional heart care, we have created designated Provider Care Teams.  These Care Teams include your primary Cardiologist (physician) and Advanced Practice Providers (APPs -  Physician Assistants and Nurse Practitioners) who all work together to provide you with the care you need, when you need it.  Your next appointment:   Structural Team will follow-up  Provider:   Earney Hamburg, MD  Other Instructions       Cardiac/Peripheral Catheterization   You are scheduled for a Cardiac Catheterization on Wednesday, March 5 with Dr. Verne Carrow.  1. Please arrive at the [facility] (Main Entrance A) at [facility]: [address] [place], [place] [address] at 11:00 AM (This time is two hour(s) before your procedure to ensure your preparation).   Free valet parking service is available. You will check in at ADMITTING. The support person will be asked to wait in the waiting room.  It is OK to have someone drop you off and come back when you are ready to be discharged.        Special note: Every effort is made to have your procedure done on time. Please understand that emergencies sometimes delay scheduled procedures.  2. Diet: Do not eat solid foods after midnight.  You may have clear liquids until 5 AM the day of the procedure.  3. Labs: CBC, BMET  4. Medication instructions in preparation for your procedure:   Contrast Allergy: Take prednisone 50mg : one tablet by mouth 13 hours prior (12pm/midnight), 7 hours prior (6 am), and final dose prior to leaving home. Take Benadryl 50mg  with final dose of prednisone.  On the morning of your procedure, take Aspirin 81 mg and any morning medicines NOT listed above.  You may use sips of water.  5. Plan to go home the same day, you will only stay overnight if medically necessary. 6. You MUST have a responsible adult to drive you home. 7. An adult MUST be with you the first 24 hours after you arrive home. 8. Bring a current list of your medications, and the last time and date medication taken. 9. Bring ID and current insurance cards. 10.Please wear clothes that are easy to get on and off and wear slip-on shoes.  Thank you for allowing [name] to care  for you!   -- [facility]

## 2023-07-16 NOTE — Progress Notes (Addendum)
 Pre Surgical Assessment: 5 M Walk Test  40M=16.62ft  5 Meter Walk Test- trial 1: 3.97 seconds 5 Meter Walk Test- trial 2: 3.84 seconds 5 Meter Walk Test- trial 3: 4.15 seconds 5 Meter Walk Test Average: 3.98 seconds  ________________________  Procedure Type: Isolated AVR Perioperative Outcome Estimate % Operative Mortality 1.69% Morbidity & Mortality 5.78% Stroke 1.31% Renal Failure 0.503% Reoperation 3.45% Prolonged Ventilation 2.9% Deep Sternal Wound Infection 0.132% Long Hospital Stay (>14 days) 2.01% Short Hospital Stay (<6 days)* 62.1

## 2023-07-16 NOTE — Progress Notes (Signed)
 [facility] Consult Note  Chief Complaint  Patient presents with   New Patient (Initial Visit)    Severe aortic stenosis   History of Present Illness: 63 yo male with history of CAD s/p 2V CABG in December 2021, bicuspid aortic valve with aortic stenosis, thoracic aortic aneurysm, HTN, HLD, spinal stenosis and peripheral neuropathy here today as a new consult, referred by Dr. Anne Fu, for further discussion regarding his aortic stenosis. He is known to have a bicuspid aortic valve. Echo in February 2024 with LVEF=65%. Moderate AS. Echo 06/30/23 with LVEF=50-55%. Severe paradoxical low flow/low gradient aortic valve stenosis with mean gradient 26 mm Hg, AVA 0.49 cm2, SVI 18, DI 0.16.   He tells me today that he has progressive dyspnea with exertion. No chest pain, dizziness, near syncope or LE edema. He lives in [place], [place] with his wife. He is a retired Nutritional therapist. He has no active dental issues. He has implants and some of his teeth. He sees a Education officer, community regularly.   Primary Care Physician: Etta Grandchild, MD Primary Cardiologist: Anne Fu Referring Cardiologist: Anne Fu  Past Medical History:  Diagnosis Date   Allergy    Arthritis    CAD (coronary artery disease)    GERD (gastroesophageal reflux disease)    occ   Heart murmur    baby   High cholesterol    Hypertension    Peripheral neuropathy    Pneumonia    Right carotid bruit 06/10/2017   Sciatica    Seasonal allergies    Sinus tachycardia    Spinal stenosis     Past Surgical History:  Procedure Laterality Date   CARDIAC CATHETERIZATION     CARPAL TUNNEL WITH CUBITAL TUNNEL Left 11/10/2013   Procedure: LEFT CARPAL TUNNEL RELEASE;  Surgeon: Wyn Forster, MD;  Location: [facility];  Service: Orthopedics;  Laterality: Left;   CARPECTOMY Left 03/13/2023   Procedure: LEFT WRIST PROXIMAL ROW CARPECTOMY WITH PLACEMENT ALLOGRAFT SPACER / THUMB CMC DENERVATION;  Surgeon: Samuella Cota, MD;   Location: [facility];  Service: Orthopedics;  Laterality: Left;   COLONOSCOPY  2023   CORONARY ARTERY BYPASS GRAFT N/A 05/01/2020   Procedure: CORONARY ARTERY BYPASS GRAFTING (CABG) X 2 ON CARDIOPULMONARY BYPASS. LIMA TO LAD, SVG TO DIAG;  Surgeon: Kerin Perna, MD;  Location: [facility] OR;  Service: Open Heart Surgery;  Laterality: N/A;   ENDOVEIN HARVEST OF GREATER SAPHENOUS VEIN Right 05/01/2020   Procedure: ENDOVEIN HARVEST OF GREATER SAPHENOUS VEIN;  Surgeon: Kerin Perna, MD;  Location: [facility];  Service: Open Heart Surgery;  Laterality: Right;   KNEE ARTHROSCOPY  [id number]   left and right   LUMBAR LAMINECTOMY/DECOMPRESSION MICRODISCECTOMY Left 09/29/2017   Procedure: LEFT LUMBAR TWO - LUMBAR THREELAMINOTOMY/MICRODISCECTOMY;  Surgeon: Shirlean Kelly, MD;  Location: [facility] OR;  Service: Neurosurgery;  Laterality: Left;  LEFT LUMBAR 2- LUMBAR 3 LAMINOTOMY/MICRODISCECTOMY   NASAL SINUS SURGERY  05/2015   RIGHT/LEFT HEART CATH AND CORONARY ANGIOGRAPHY N/A 04/10/2020   Procedure: RIGHT/LEFT HEART CATH AND CORONARY ANGIOGRAPHY;  Surgeon: Lennette Bihari, MD;  Location: [facility];  Service: Cardiovascular;  Laterality: N/A;   TEE WITHOUT CARDIOVERSION N/A 05/01/2020   Procedure: TRANSESOPHAGEAL ECHOCARDIOGRAM (TEE);  Surgeon: Donata Clay, Theron Arista, MD;  Location: [facility];  Service: Open Heart Surgery;  Laterality: N/A;   TOTAL KNEE ARTHROPLASTY Right 06/24/2016   Procedure: TOTAL KNEE ARTHROPLASTY;  Surgeon: Valeria Batman, MD;  Location: [facility] OR;  Service: Orthopedics;  Laterality:  Right;   TOTAL KNEE ARTHROPLASTY Left 12/18/2020   Procedure: LEFT TOTAL KNEE ARTHROPLASTY;  Surgeon: Valeria Batman, MD;  Location: [facility];  Service: Orthopedics;  Laterality: Left;   TRANSFORAMINAL LUMBAR INTERBODY FUSION W/ MIS 2 LEVEL Left 04/15/2022   Procedure: Minimally Invasive Surgery Decompression and Transforaminal Lumbar Interbody Fusion , Left , Lumbar three-four, Lumbar four-five;   Surgeon: Dawley, Alan Mulder, DO;  Location: [facility] OR;  Service: Neurosurgery;  Laterality: Left;   TRIGGER FINGER RELEASE Left 11/10/2013   Procedure: LEFT INDEX A-1 PULLEY RELEASE;  Surgeon: Wyn Forster, MD;  Location: [facility];  Service: Orthopedics;  Laterality: Left;   ULNAR NERVE TRANSPOSITION Left 11/10/2013   Procedure: ULNAR NERVE TRANSPOSITION;  Surgeon: Wyn Forster, MD;  Location: [facility];  Service: Orthopedics;  Laterality: Left;    Current Outpatient Medications  Medication Sig Dispense Refill   acetaminophen (TYLENOL) 500 MG tablet Take 500-1,000 mg by mouth every 6 (six) hours as needed (pain.).     Alirocumab (PRALUENT) 75 MG/ML SOAJ Inject 1 mL (75 mg total) into the skin every 14 (fourteen) days. 2 mL 11   aspirin EC 81 MG tablet Take 1 tablet (81 mg total) by mouth in the morning. Swallow whole. 30 tablet 12   clobetasol cream (TEMOVATE) 0.05 % Apply 1 Application topically 2 (two) times daily as needed (skin irritation/rash.).     diltiazem (CARDIZEM CD) 120 MG 24 hr capsule Take 2 capsules (240 mg total) by mouth daily. 180 capsule 3   doxycycline (VIBRAMYCIN) 100 MG capsule Take 2 tablets by mouth 1 hour prior to dental procedure. 4 capsule 2   fluticasone (FLONASE) 50 MCG/ACT nasal spray Place 2 sprays into both nostrils in the morning.     ibuprofen (ADVIL) 600 MG tablet Take 1 tablet (600 mg total) by mouth every 8 (eight) hours as needed for moderate pain. 30 tablet 0   olmesartan (BENICAR) 20 MG tablet Take 0.5 tablets (10 mg total) by mouth daily. 45 tablet 1   oxyCODONE (ROXICODONE) 5 MG immediate release tablet Take 1 tablet (5 mg total) by mouth every 6 (six) hours as needed for severe pain (pain score 7-10). 30 tablet 0   Polyethyl Glycol-Propyl Glycol (LUBRICANT EYE DROPS) 0.4-0.3 % SOLN Place 1 drop into both eyes 3 (three) times daily as needed (dry/irritated eyes).     traMADol (ULTRAM) 50 MG tablet Take 25 mg by mouth  every 6 (six) hours as needed for moderate pain (pain score 4-6).     No current facility-administered medications for this visit.    Allergies  Allergen Reactions   Mushroom Extract Complex (Obsolete) Anaphylaxis   Penicillins Shortness Of Breath    Childhood allergy.  Has patient had a PCN reaction causing immediate rash, facial/tongue/throat swelling, SOB or lightheadedness with hypotension: No Has patient had a PCN reaction causing severe rash involving mucus membranes or skin necrosis: No Has patient had a PCN reaction that required hospitalization No Has patient had a PCN reaction occurring within the last 10 years: No If all of the above answers are "NO", then may proceed with C   Chantix [Varenicline] Other (See Comments)    headaches    Crestor [Rosuvastatin] Other (See Comments)    myalgia   Dilaudid [Hydromorphone] Nausea And Vomiting   Lipitor [Atorvastatin] Other (See Comments)    myalgia   Losartan Potassium Other (See Comments)    Lethargic    Lovaza [Omega-3-Acid Ethyl  Esters (Fish)] Other (See Comments)    myalgia   Oxycodone Hcl Other (See Comments)    hallucinations   Pravachol [Pravastatin] Other (See Comments)    myalgia   Bystolic [Nebivolol Hcl] Other (See Comments)    Extreme fatigue, not able to focus   Codeine Nausea And Vomiting   Iodine Rash   Iohexol Rash   Lopressor [Metoprolol] Other (See Comments)    Made patient very fatigued, could not focus   Red Dye #40 (Allura Red) Rash   Repatha [Evolocumab] Rash and Other (See Comments)    Hip pain and rash    Social History   Socioeconomic History   Marital status: Married    Spouse name: Judeth Cornfield   Number of children: 0   Years of education: 12   Highest education level: Not on file  Occupational History   Occupation: Self employed    Comment: Nutritional therapist   Occupation: Retired Nutritional therapist  Tobacco Use   Smoking status: Former    Current packs/day: 0.00    Average packs/day: 1 pack/day for  37.0 years (37.0 ttl pk-yrs)    Types: Cigarettes    Start date: 05/05/1979    Quit date: 05/04/2016    Years since quitting: 7.2   Smokeless tobacco: Never  Vaping Use   Vaping status: Former  Substance and Sexual Activity   Alcohol use: Yes    Alcohol/week: 5.0 standard drinks of alcohol    Types: 5 Glasses of wine per week    Comment: weekends only with dinner   Drug use: No   Sexual activity: Yes  Other Topics Concern   Not on file  Social History Narrative   Lives with wife, Judeth Cornfield   Caffeine use: 1 Coffee daily   Right handed    Social Drivers of Corporate investment banker Strain: Not on file  Food Insecurity: Not on file  Transportation Needs: Not on file  Physical Activity: Not on file  Stress: Not on file  Social Connections: Not on file  Intimate Partner Violence: Not on file    Family History  Problem Relation Age of Onset   Hypertension Mother    Throat cancer Mother    COPD Mother    Hypertension Father    Cancer Father        Mesothelioma   Colon cancer Neg Hx    Esophageal cancer Neg Hx    Stomach cancer Neg Hx    Rectal cancer Neg Hx    Colon polyps Neg Hx     Review of Systems:  As stated in the HPI and otherwise negative.   BP 130/82   Pulse (!) 116   Ht 5\' 8"  (1.727 m)   Wt 67.2 kg   SpO2 95%   BMI 22.53 kg/m   Physical Examination: General: Well developed, well nourished, NAD  HEENT: OP clear, mucus membranes moist  SKIN: warm, dry. No rashes. Neuro: No focal deficits  Musculoskeletal: Muscle strength 5/5 all ext  Psychiatric: Mood and affect normal  Neck: No JVD, no carotid bruits, no thyromegaly, no lymphadenopathy.  Lungs:Clear bilaterally, no wheezes, rhonci, crackles Cardiovascular: Regular rate and rhythm. Loud, harsh, late peaking systolic murmur.  Abdomen:Soft. Bowel sounds present. Non-tender.  Extremities: No lower extremity edema. Pulses are 2 + in the bilateral DP/PT.  EKG:  EKG is ordered today. The ekg  ordered today demonstrates  EKG Interpretation Date/Time:  Thursday July 16 2023 14:18:10 EST Ventricular Rate:  114 PR Interval:  174 QRS  Duration:  74 QT Interval:  318 QTC Calculation: 438 R Axis:   23  Text Interpretation: Sinus tachycardia Poor R wave progression precordial leads Confirmed by Verne Carrow [phone]) on 07/16/2023 2:20:11 PM    Echo 06/30/23:  1. Abnormal septal motion inferior apical hypokinesis EF less robust  comared to prior TTE 07/10/22. Left ventricular ejection fraction, by  estimation, is 50 to 55%. The left ventricle has low normal function. The  left ventricle has no regional wall  motion abnormalities. There is mild left ventricular hypertrophy. Left  ventricular diastolic parameters are consistent with Grade I diastolic  dysfunction (impaired relaxation). The average left ventricular global  longitudinal strain is -12.7 %. The  global longitudinal strain is abnormal.   2. Right ventricular systolic function is normal. The right ventricular  size is normal.   3. The mitral valve is normal in structure. No evidence of mitral valve  regurgitation. No evidence of mitral stenosis.   4. Not well seen indicated to be tri leaflet by prior TEE Heavily  calcified. Gradients similar to prior echo 07/10/22 but calcuated AVA and  DVI lower due to lower measured LVOT TVI and LVOT diamter. . The aortic  valve is normal in structure. There is  severe calcifcation of the aortic valve. There is severe thickening of the  aortic valve. Aortic valve regurgitation is not visualized. Moderate to  severe aortic valve stenosis.   5. Aortic dilatation noted. There is mild dilatation of the ascending  aorta, measuring 40 mm.   6. The inferior vena cava is normal in size with greater than 50%  respiratory variability, suggesting right atrial pressure of 3 mmHg.   FINDINGS   Left Ventricle: Abnormal septal motion inferior apical hypokinesis EF  less robust comared  to prior TTE 07/10/22. Left ventricular ejection  fraction, by estimation, is 50 to 55%. The left ventricle has low normal  function. The left ventricle has no  regional wall motion abnormalities. The average left ventricular global  longitudinal strain is -12.7 %. The global longitudinal strain is  abnormal. The left ventricular internal cavity size was normal in size.  There is mild left ventricular hypertrophy.  Left ventricular diastolic parameters are consistent with Grade I  diastolic dysfunction (impaired relaxation).   Right Ventricle: The right ventricular size is normal. No increase in  right ventricular wall thickness. Right ventricular systolic function is  normal.   Left Atrium: Left atrial size was normal in size.   Right Atrium: Right atrial size was normal in size.   Pericardium: There is no evidence of pericardial effusion.   Mitral Valve: The mitral valve is normal in structure. No evidence of  mitral valve regurgitation. No evidence of mitral valve stenosis.   Tricuspid Valve: The tricuspid valve is normal in structure. Tricuspid  valve regurgitation is mild . No evidence of tricuspid stenosis.   Aortic Valve: Not well seen indicated to be tri leaflet by prior TEE  Heavily calcified. Gradients similar to prior echo 07/10/22 but calcuated  AVA and DVI lower due to lower measured LVOT TVI and LVOT diamter. The  aortic valve is normal in structure.  There is severe calcifcation of the aortic valve. There is severe  thickening of the aortic valve. Aortic valve regurgitation is not  visualized. Aortic regurgitation PHT measures 281 msec. Moderate to severe  aortic stenosis is present. Aortic valve mean  gradient measures 26.0 mmHg. Aortic valve peak gradient measures 45.4  mmHg. Aortic valve area, by VTI measures  0.49 cm.   Pulmonic Valve: The pulmonic valve was normal in structure. Pulmonic valve  regurgitation is not visualized. No evidence of pulmonic stenosis.    Aorta: Aortic dilatation noted. There is mild dilatation of the ascending  aorta, measuring 40 mm.   Venous: The inferior vena cava is normal in size with greater than 50%  respiratory variability, suggesting right atrial pressure of 3 mmHg.   IAS/Shunts: No atrial level shunt detected by color flow Doppler.     LEFT VENTRICLE  PLAX 2D  LVIDd:         3.84 cm   Diastology  LVIDs:         2.95 cm   LV e' medial:    6.96 cm/s  LV PW:         1.23 cm   LV E/e' medial:  9.3  LV IVS:        1.12 cm   LV e' lateral:   14.30 cm/s  LVOT diam:     2.00 cm   LV E/e' lateral: 4.5  LV SV:         33  LV SV Index:   18        2D Longitudinal Strain  LVOT Area:     3.14 cm  2D Strain GLS Avg:     -12.7 %                             3D Volume EF:                           3D EF:        49 %                           LV EDV:       154 ml                           LV ESV:       79 ml                           LV SV:        75 ml   RIGHT VENTRICLE  RV Basal diam:  2.45 cm  RV Mid diam:    2.02 cm  RV S prime:     12.30 cm/s  TAPSE (M-mode): 1.2 cm   LEFT ATRIUM             Index        RIGHT ATRIUM           Index  LA diam:        3.50 cm 1.93 cm/m   RA Area:     18.10 cm  LA Vol (A2C):   39.8 ml 21.98 ml/m  RA Volume:   51.70 ml  28.56 ml/m  LA Vol (A4C):   35.7 ml 19.72 ml/m  LA Biplane Vol: 40.6 ml 22.43 ml/m   AORTIC VALVE  AV Area (Vmax):    0.60 cm  AV Area (Vmean):   0.56 cm  AV Area (VTI):     0.49 cm  AV Vmax:           337.00 cm/s  AV Vmean:  234.000 cm/s  AV VTI:            0.668 m  AV Peak Grad:      45.4 mmHg  AV Mean Grad:      26.0 mmHg  LVOT Vmax:         64.00 cm/s  LVOT Vmean:        41.400 cm/s  LVOT VTI:          0.104 m  LVOT/AV VTI ratio: 0.16  AI PHT:            281 msec  AR Vena Contracta: 0.53 cm    AORTA  Ao Root diam: 3.60 cm  Ao Asc diam:  4.00 cm   MITRAL VALVE  MV Area (PHT): 4.36 cm    SHUNTS  MV Decel Time: 174 msec     Systemic VTI:  0.10 m  MV E velocity: 65.00 cm/s  Systemic Diam: 2.00 cm  MV A velocity: 84.90 cm/s  MV E/A ratio:  0.77   Recent Labs: 08/21/2022: ALT 25; BUN 17; Creatinine, Ser 0.77; Hemoglobin 14.8; Platelets 234.0; Potassium 5.1; Sodium 138 09/18/2022: TSH 1.04    Wt Readings from Last 3 Encounters:  07/16/23 67.2 kg  03/13/23 68.2 kg  12/18/22 68.2 kg    Assessment and Plan:   1. Severe Aortic Valve Stenosis: He has severe paradoxical low flow/low gradient aortic valve stenosis. He has NYHA class 2 symptoms. I have personally reviewed the echo images. The aortic valve is thickened and calcified with limited leaflet mobility. I think he would benefit from AVR. He has had prior open chest surgery in 2021 for bypass. He would be a candidate for surgical AVR or TAVR but if appropriate, would lean toward TAVR to avoid repeat open chest surgery.   I have reviewed the natural history of aortic stenosis with the patient and their family members  who are present today. We have discussed the limitations of medical therapy and the poor prognosis associated with symptomatic aortic stenosis. We have reviewed potential treatment options, including palliative medical therapy, conventional surgical aortic valve replacement, and transcatheter aortic valve replacement. We discussed treatment options in the context of the patient's specific comorbid medical conditions.   He would like to proceed with planning for AVR. I will arrange a right and left heart catheterization at [facility] 07/22/23 at 1pm. Risks and benefits of the cath procedure and the valve procedure are reviewed with the patient. After the cath, he will have a cardiac CT, CTA of the chest/abdomen and pelvis and will then be referred to see one of the CT surgeons on our TAVR team.  BMET and CBC today Pre-treatment for contrast allergy    Labs/ tests ordered today include:   Orders Placed This Encounter  Procedures   Basic metabolic panel   CBC    EKG 12-Lead   Disposition:   F/U will be arranged with the structural team  Signed, Verne Carrow, MD, [facility] 07/16/2023 2:31 PM    [facility] [address] [place], [place], [place]  [address] Phone: [phone]; Fax: [phone]

## 2023-07-16 NOTE — H&P (View-Only) (Signed)
 [facility] Consult Note  Chief Complaint  Patient presents with   New Patient (Initial Visit)    Severe aortic stenosis   History of Present Illness: 63 yo male with history of CAD s/p 2V CABG in December 2021, bicuspid aortic valve with aortic stenosis, thoracic aortic aneurysm, HTN, HLD, spinal stenosis and peripheral neuropathy here today as a new consult, referred by Dr. Anne Fu, for further discussion regarding his aortic stenosis. He is known to have a bicuspid aortic valve. Echo in February 2024 with LVEF=65%. Moderate AS. Echo 06/30/23 with LVEF=50-55%. Severe paradoxical low flow/low gradient aortic valve stenosis with mean gradient 26 mm Hg, AVA 0.49 cm2, SVI 18, DI 0.16.   He tells me today that he has progressive dyspnea with exertion. No chest pain, dizziness, near syncope or LE edema. He lives in [place], [place] with his wife. He is a retired Nutritional therapist. He has no active dental issues. He has implants and some of his teeth. He sees a Education officer, community regularly.   Primary Care Physician: Etta Grandchild, MD Primary Cardiologist: Anne Fu Referring Cardiologist: Anne Fu  Past Medical History:  Diagnosis Date   Allergy    Arthritis    CAD (coronary artery disease)    GERD (gastroesophageal reflux disease)    occ   Heart murmur    baby   High cholesterol    Hypertension    Peripheral neuropathy    Pneumonia    Right carotid bruit 06/10/2017   Sciatica    Seasonal allergies    Sinus tachycardia    Spinal stenosis     Past Surgical History:  Procedure Laterality Date   CARDIAC CATHETERIZATION     CARPAL TUNNEL WITH CUBITAL TUNNEL Left 11/10/2013   Procedure: LEFT CARPAL TUNNEL RELEASE;  Surgeon: Wyn Forster, MD;  Location: [facility];  Service: Orthopedics;  Laterality: Left;   CARPECTOMY Left 03/13/2023   Procedure: LEFT WRIST PROXIMAL ROW CARPECTOMY WITH PLACEMENT ALLOGRAFT SPACER / THUMB CMC DENERVATION;  Surgeon: Samuella Cota, MD;   Location: [facility];  Service: Orthopedics;  Laterality: Left;   COLONOSCOPY  2023   CORONARY ARTERY BYPASS GRAFT N/A 05/01/2020   Procedure: CORONARY ARTERY BYPASS GRAFTING (CABG) X 2 ON CARDIOPULMONARY BYPASS. LIMA TO LAD, SVG TO DIAG;  Surgeon: Kerin Perna, MD;  Location: [facility] OR;  Service: Open Heart Surgery;  Laterality: N/A;   ENDOVEIN HARVEST OF GREATER SAPHENOUS VEIN Right 05/01/2020   Procedure: ENDOVEIN HARVEST OF GREATER SAPHENOUS VEIN;  Surgeon: Kerin Perna, MD;  Location: [facility];  Service: Open Heart Surgery;  Laterality: Right;   KNEE ARTHROSCOPY  [id number]   left and right   LUMBAR LAMINECTOMY/DECOMPRESSION MICRODISCECTOMY Left 09/29/2017   Procedure: LEFT LUMBAR TWO - LUMBAR THREELAMINOTOMY/MICRODISCECTOMY;  Surgeon: Shirlean Kelly, MD;  Location: [facility] OR;  Service: Neurosurgery;  Laterality: Left;  LEFT LUMBAR 2- LUMBAR 3 LAMINOTOMY/MICRODISCECTOMY   NASAL SINUS SURGERY  05/2015   RIGHT/LEFT HEART CATH AND CORONARY ANGIOGRAPHY N/A 04/10/2020   Procedure: RIGHT/LEFT HEART CATH AND CORONARY ANGIOGRAPHY;  Surgeon: Lennette Bihari, MD;  Location: [facility];  Service: Cardiovascular;  Laterality: N/A;   TEE WITHOUT CARDIOVERSION N/A 05/01/2020   Procedure: TRANSESOPHAGEAL ECHOCARDIOGRAM (TEE);  Surgeon: Donata Clay, Theron Arista, MD;  Location: [facility];  Service: Open Heart Surgery;  Laterality: N/A;   TOTAL KNEE ARTHROPLASTY Right 06/24/2016   Procedure: TOTAL KNEE ARTHROPLASTY;  Surgeon: Valeria Batman, MD;  Location: [facility] OR;  Service: Orthopedics;  Laterality:  Right;   TOTAL KNEE ARTHROPLASTY Left 12/18/2020   Procedure: LEFT TOTAL KNEE ARTHROPLASTY;  Surgeon: Valeria Batman, MD;  Location: [facility];  Service: Orthopedics;  Laterality: Left;   TRANSFORAMINAL LUMBAR INTERBODY FUSION W/ MIS 2 LEVEL Left 04/15/2022   Procedure: Minimally Invasive Surgery Decompression and Transforaminal Lumbar Interbody Fusion , Left , Lumbar three-four, Lumbar four-five;   Surgeon: Dawley, Alan Mulder, DO;  Location: [facility] OR;  Service: Neurosurgery;  Laterality: Left;   TRIGGER FINGER RELEASE Left 11/10/2013   Procedure: LEFT INDEX A-1 PULLEY RELEASE;  Surgeon: Wyn Forster, MD;  Location: [facility];  Service: Orthopedics;  Laterality: Left;   ULNAR NERVE TRANSPOSITION Left 11/10/2013   Procedure: ULNAR NERVE TRANSPOSITION;  Surgeon: Wyn Forster, MD;  Location: [facility];  Service: Orthopedics;  Laterality: Left;    Current Outpatient Medications  Medication Sig Dispense Refill   acetaminophen (TYLENOL) 500 MG tablet Take 500-1,000 mg by mouth every 6 (six) hours as needed (pain.).     Alirocumab (PRALUENT) 75 MG/ML SOAJ Inject 1 mL (75 mg total) into the skin every 14 (fourteen) days. 2 mL 11   aspirin EC 81 MG tablet Take 1 tablet (81 mg total) by mouth in the morning. Swallow whole. 30 tablet 12   clobetasol cream (TEMOVATE) 0.05 % Apply 1 Application topically 2 (two) times daily as needed (skin irritation/rash.).     diltiazem (CARDIZEM CD) 120 MG 24 hr capsule Take 2 capsules (240 mg total) by mouth daily. 180 capsule 3   doxycycline (VIBRAMYCIN) 100 MG capsule Take 2 tablets by mouth 1 hour prior to dental procedure. 4 capsule 2   fluticasone (FLONASE) 50 MCG/ACT nasal spray Place 2 sprays into both nostrils in the morning.     ibuprofen (ADVIL) 600 MG tablet Take 1 tablet (600 mg total) by mouth every 8 (eight) hours as needed for moderate pain. 30 tablet 0   olmesartan (BENICAR) 20 MG tablet Take 0.5 tablets (10 mg total) by mouth daily. 45 tablet 1   oxyCODONE (ROXICODONE) 5 MG immediate release tablet Take 1 tablet (5 mg total) by mouth every 6 (six) hours as needed for severe pain (pain score 7-10). 30 tablet 0   Polyethyl Glycol-Propyl Glycol (LUBRICANT EYE DROPS) 0.4-0.3 % SOLN Place 1 drop into both eyes 3 (three) times daily as needed (dry/irritated eyes).     traMADol (ULTRAM) 50 MG tablet Take 25 mg by mouth  every 6 (six) hours as needed for moderate pain (pain score 4-6).     No current facility-administered medications for this visit.    Allergies  Allergen Reactions   Mushroom Extract Complex (Obsolete) Anaphylaxis   Penicillins Shortness Of Breath    Childhood allergy.  Has patient had a PCN reaction causing immediate rash, facial/tongue/throat swelling, SOB or lightheadedness with hypotension: No Has patient had a PCN reaction causing severe rash involving mucus membranes or skin necrosis: No Has patient had a PCN reaction that required hospitalization No Has patient had a PCN reaction occurring within the last 10 years: No If all of the above answers are "NO", then may proceed with C   Chantix [Varenicline] Other (See Comments)    headaches    Crestor [Rosuvastatin] Other (See Comments)    myalgia   Dilaudid [Hydromorphone] Nausea And Vomiting   Lipitor [Atorvastatin] Other (See Comments)    myalgia   Losartan Potassium Other (See Comments)    Lethargic    Lovaza [Omega-3-Acid Ethyl  Esters (Fish)] Other (See Comments)    myalgia   Oxycodone Hcl Other (See Comments)    hallucinations   Pravachol [Pravastatin] Other (See Comments)    myalgia   Bystolic [Nebivolol Hcl] Other (See Comments)    Extreme fatigue, not able to focus   Codeine Nausea And Vomiting   Iodine Rash   Iohexol Rash   Lopressor [Metoprolol] Other (See Comments)    Made patient very fatigued, could not focus   Red Dye #40 (Allura Red) Rash   Repatha [Evolocumab] Rash and Other (See Comments)    Hip pain and rash    Social History   Socioeconomic History   Marital status: Married    Spouse name: Judeth Cornfield   Number of children: 0   Years of education: 12   Highest education level: Not on file  Occupational History   Occupation: Self employed    Comment: Nutritional therapist   Occupation: Retired Nutritional therapist  Tobacco Use   Smoking status: Former    Current packs/day: 0.00    Average packs/day: 1 pack/day for  37.0 years (37.0 ttl pk-yrs)    Types: Cigarettes    Start date: 05/05/1979    Quit date: 05/04/2016    Years since quitting: 7.2   Smokeless tobacco: Never  Vaping Use   Vaping status: Former  Substance and Sexual Activity   Alcohol use: Yes    Alcohol/week: 5.0 standard drinks of alcohol    Types: 5 Glasses of wine per week    Comment: weekends only with dinner   Drug use: No   Sexual activity: Yes  Other Topics Concern   Not on file  Social History Narrative   Lives with wife, Judeth Cornfield   Caffeine use: 1 Coffee daily   Right handed    Social Drivers of Corporate investment banker Strain: Not on file  Food Insecurity: Not on file  Transportation Needs: Not on file  Physical Activity: Not on file  Stress: Not on file  Social Connections: Not on file  Intimate Partner Violence: Not on file    Family History  Problem Relation Age of Onset   Hypertension Mother    Throat cancer Mother    COPD Mother    Hypertension Father    Cancer Father        Mesothelioma   Colon cancer Neg Hx    Esophageal cancer Neg Hx    Stomach cancer Neg Hx    Rectal cancer Neg Hx    Colon polyps Neg Hx     Review of Systems:  As stated in the HPI and otherwise negative.   BP 130/82   Pulse (!) 116   Ht 5\' 8"  (1.727 m)   Wt 67.2 kg   SpO2 95%   BMI 22.53 kg/m   Physical Examination: General: Well developed, well nourished, NAD  HEENT: OP clear, mucus membranes moist  SKIN: warm, dry. No rashes. Neuro: No focal deficits  Musculoskeletal: Muscle strength 5/5 all ext  Psychiatric: Mood and affect normal  Neck: No JVD, no carotid bruits, no thyromegaly, no lymphadenopathy.  Lungs:Clear bilaterally, no wheezes, rhonci, crackles Cardiovascular: Regular rate and rhythm. Loud, harsh, late peaking systolic murmur.  Abdomen:Soft. Bowel sounds present. Non-tender.  Extremities: No lower extremity edema. Pulses are 2 + in the bilateral DP/PT.  EKG:  EKG is ordered today. The ekg  ordered today demonstrates  EKG Interpretation Date/Time:  Thursday July 16 2023 14:18:10 EST Ventricular Rate:  114 PR Interval:  174 QRS  Duration:  74 QT Interval:  318 QTC Calculation: 438 R Axis:   23  Text Interpretation: Sinus tachycardia Poor R wave progression precordial leads Confirmed by Verne Carrow [phone]) on 07/16/2023 2:20:11 PM    Echo 06/30/23:  1. Abnormal septal motion inferior apical hypokinesis EF less robust  comared to prior TTE 07/10/22. Left ventricular ejection fraction, by  estimation, is 50 to 55%. The left ventricle has low normal function. The  left ventricle has no regional wall  motion abnormalities. There is mild left ventricular hypertrophy. Left  ventricular diastolic parameters are consistent with Grade I diastolic  dysfunction (impaired relaxation). The average left ventricular global  longitudinal strain is -12.7 %. The  global longitudinal strain is abnormal.   2. Right ventricular systolic function is normal. The right ventricular  size is normal.   3. The mitral valve is normal in structure. No evidence of mitral valve  regurgitation. No evidence of mitral stenosis.   4. Not well seen indicated to be tri leaflet by prior TEE Heavily  calcified. Gradients similar to prior echo 07/10/22 but calcuated AVA and  DVI lower due to lower measured LVOT TVI and LVOT diamter. . The aortic  valve is normal in structure. There is  severe calcifcation of the aortic valve. There is severe thickening of the  aortic valve. Aortic valve regurgitation is not visualized. Moderate to  severe aortic valve stenosis.   5. Aortic dilatation noted. There is mild dilatation of the ascending  aorta, measuring 40 mm.   6. The inferior vena cava is normal in size with greater than 50%  respiratory variability, suggesting right atrial pressure of 3 mmHg.   FINDINGS   Left Ventricle: Abnormal septal motion inferior apical hypokinesis EF  less robust comared  to prior TTE 07/10/22. Left ventricular ejection  fraction, by estimation, is 50 to 55%. The left ventricle has low normal  function. The left ventricle has no  regional wall motion abnormalities. The average left ventricular global  longitudinal strain is -12.7 %. The global longitudinal strain is  abnormal. The left ventricular internal cavity size was normal in size.  There is mild left ventricular hypertrophy.  Left ventricular diastolic parameters are consistent with Grade I  diastolic dysfunction (impaired relaxation).   Right Ventricle: The right ventricular size is normal. No increase in  right ventricular wall thickness. Right ventricular systolic function is  normal.   Left Atrium: Left atrial size was normal in size.   Right Atrium: Right atrial size was normal in size.   Pericardium: There is no evidence of pericardial effusion.   Mitral Valve: The mitral valve is normal in structure. No evidence of  mitral valve regurgitation. No evidence of mitral valve stenosis.   Tricuspid Valve: The tricuspid valve is normal in structure. Tricuspid  valve regurgitation is mild . No evidence of tricuspid stenosis.   Aortic Valve: Not well seen indicated to be tri leaflet by prior TEE  Heavily calcified. Gradients similar to prior echo 07/10/22 but calcuated  AVA and DVI lower due to lower measured LVOT TVI and LVOT diamter. The  aortic valve is normal in structure.  There is severe calcifcation of the aortic valve. There is severe  thickening of the aortic valve. Aortic valve regurgitation is not  visualized. Aortic regurgitation PHT measures 281 msec. Moderate to severe  aortic stenosis is present. Aortic valve mean  gradient measures 26.0 mmHg. Aortic valve peak gradient measures 45.4  mmHg. Aortic valve area, by VTI measures  0.49 cm.   Pulmonic Valve: The pulmonic valve was normal in structure. Pulmonic valve  regurgitation is not visualized. No evidence of pulmonic stenosis.    Aorta: Aortic dilatation noted. There is mild dilatation of the ascending  aorta, measuring 40 mm.   Venous: The inferior vena cava is normal in size with greater than 50%  respiratory variability, suggesting right atrial pressure of 3 mmHg.   IAS/Shunts: No atrial level shunt detected by color flow Doppler.     LEFT VENTRICLE  PLAX 2D  LVIDd:         3.84 cm   Diastology  LVIDs:         2.95 cm   LV e' medial:    6.96 cm/s  LV PW:         1.23 cm   LV E/e' medial:  9.3  LV IVS:        1.12 cm   LV e' lateral:   14.30 cm/s  LVOT diam:     2.00 cm   LV E/e' lateral: 4.5  LV SV:         33  LV SV Index:   18        2D Longitudinal Strain  LVOT Area:     3.14 cm  2D Strain GLS Avg:     -12.7 %                             3D Volume EF:                           3D EF:        49 %                           LV EDV:       154 ml                           LV ESV:       79 ml                           LV SV:        75 ml   RIGHT VENTRICLE  RV Basal diam:  2.45 cm  RV Mid diam:    2.02 cm  RV S prime:     12.30 cm/s  TAPSE (M-mode): 1.2 cm   LEFT ATRIUM             Index        RIGHT ATRIUM           Index  LA diam:        3.50 cm 1.93 cm/m   RA Area:     18.10 cm  LA Vol (A2C):   39.8 ml 21.98 ml/m  RA Volume:   51.70 ml  28.56 ml/m  LA Vol (A4C):   35.7 ml 19.72 ml/m  LA Biplane Vol: 40.6 ml 22.43 ml/m   AORTIC VALVE  AV Area (Vmax):    0.60 cm  AV Area (Vmean):   0.56 cm  AV Area (VTI):     0.49 cm  AV Vmax:           337.00 cm/s  AV Vmean:  234.000 cm/s  AV VTI:            0.668 m  AV Peak Grad:      45.4 mmHg  AV Mean Grad:      26.0 mmHg  LVOT Vmax:         64.00 cm/s  LVOT Vmean:        41.400 cm/s  LVOT VTI:          0.104 m  LVOT/AV VTI ratio: 0.16  AI PHT:            281 msec  AR Vena Contracta: 0.53 cm    AORTA  Ao Root diam: 3.60 cm  Ao Asc diam:  4.00 cm   MITRAL VALVE  MV Area (PHT): 4.36 cm    SHUNTS  MV Decel Time: 174 msec     Systemic VTI:  0.10 m  MV E velocity: 65.00 cm/s  Systemic Diam: 2.00 cm  MV A velocity: 84.90 cm/s  MV E/A ratio:  0.77   Recent Labs: 08/21/2022: ALT 25; BUN 17; Creatinine, Ser 0.77; Hemoglobin 14.8; Platelets 234.0; Potassium 5.1; Sodium 138 09/18/2022: TSH 1.04    Wt Readings from Last 3 Encounters:  07/16/23 67.2 kg  03/13/23 68.2 kg  12/18/22 68.2 kg    Assessment and Plan:   1. Severe Aortic Valve Stenosis: He has severe paradoxical low flow/low gradient aortic valve stenosis. He has NYHA class 2 symptoms. I have personally reviewed the echo images. The aortic valve is thickened and calcified with limited leaflet mobility. I think he would benefit from AVR. He has had prior open chest surgery in 2021 for bypass. He would be a candidate for surgical AVR or TAVR but if appropriate, would lean toward TAVR to avoid repeat open chest surgery.   I have reviewed the natural history of aortic stenosis with the patient and their family members  who are present today. We have discussed the limitations of medical therapy and the poor prognosis associated with symptomatic aortic stenosis. We have reviewed potential treatment options, including palliative medical therapy, conventional surgical aortic valve replacement, and transcatheter aortic valve replacement. We discussed treatment options in the context of the patient's specific comorbid medical conditions.   He would like to proceed with planning for AVR. I will arrange a right and left heart catheterization at [facility] 07/22/23 at 1pm. Risks and benefits of the cath procedure and the valve procedure are reviewed with the patient. After the cath, he will have a cardiac CT, CTA of the chest/abdomen and pelvis and will then be referred to see one of the CT surgeons on our TAVR team.  BMET and CBC today Pre-treatment for contrast allergy    Labs/ tests ordered today include:   Orders Placed This Encounter  Procedures   Basic metabolic panel   CBC    EKG 12-Lead   Disposition:   F/U will be arranged with the structural team  Signed, Verne Carrow, MD, [facility] 07/16/2023 2:31 PM    [facility] [address] [place], [place], [place]  [address] Phone: [phone]; Fax: [phone]

## 2023-07-17 LAB — BASIC METABOLIC PANEL
BUN/Creatinine Ratio: 21 (ref 10–24)
BUN: 17 mg/dL (ref 8–27)
CO2: 23 mmol/L (ref 20–29)
Calcium: 9.6 mg/dL (ref 8.6–10.2)
Chloride: 101 mmol/L (ref 96–106)
Creatinine, Ser: 0.82 mg/dL (ref 0.76–1.27)
Glucose: 83 mg/dL (ref 70–99)
Potassium: 4.5 mmol/L (ref 3.5–5.2)
Sodium: 139 mmol/L (ref 134–144)
eGFR: 99 mL/min/{1.73_m2} (ref >59)

## 2023-07-17 LAB — CBC
Hematocrit: 43.9 % (ref 37.5–51.0)
Hemoglobin: 15 g/dL (ref 13.0–17.7)
MCH: 33.6 pg — ABNORMAL HIGH (ref 26.6–33.0)
MCHC: 34.2 g/dL (ref 31.5–35.7)
MCV: 98 fL — ABNORMAL HIGH (ref 79–97)
Platelets: 214 10*3/uL (ref 150–450)
RBC: 4.47 x10E6/uL (ref 4.14–5.80)
RDW: 12.3 % (ref 11.6–15.4)
WBC: 6.3 10*3/uL (ref 3.4–10.8)

## 2023-07-21 ENCOUNTER — Telehealth: Payer: Self-pay | Admitting: *Deleted

## 2023-07-21 NOTE — Telephone Encounter (Signed)
 Cardiac Catheterization scheduled at [facility] for: Wednesday July 21, 2023 1 PM Arrival time [facility] Main Entrance A at:11 AM  Nothing to eat after midnight prior to procedure, clear liquids until 5 AM day of procedure.  CONTRAST ALLERGY: 13 hour Prednisone and Benadryl Prep: 07/21/33 Prednisone 50 mg 12 AM (midnight) 07/22/23 Prednisone 50 mg 6 AM 07/22/23 Prednisone 50 mg and Benadryl 50 mg just prior to leaving home for the hospital. Patient advised not to drive after taking Benadryl.  Medication instructions: -Usual morning medications can be taken with sips of water including aspirin 81 mg.  Plan to go home the same day, you will only stay overnight if medically necessary.  You must have responsible adult to drive you home.  Someone must be with you the first 24 hours after you arrive home.  Reviewed procedure instructions with patient.

## 2023-07-22 ENCOUNTER — Other Ambulatory Visit: Payer: Self-pay

## 2023-07-22 MED ORDER — PREDNISONE 50 MG PO TABS: ORAL_TABLET | ORAL | 0 refills | Status: DC

## 2023-07-27 DIAGNOSIS — K080 Exfoliation of teeth due to systemic causes: Secondary | ICD-10-CM | POA: Diagnosis not present

## 2023-07-29 ENCOUNTER — Telehealth: Payer: Self-pay | Admitting: *Deleted

## 2023-07-29 NOTE — Telephone Encounter (Signed)
 Cardiac Catheterization scheduled at [facility] for: Thursday July 30, 2023 10:30 AM Arrival time [facility] Main Entrance A at: 8:30 AM  Nothing to eat after midnight prior to procedure, clear liquids until 5 AM day of procedure.  CONTRAST ALLERGY: 13 hour Prednisone and Benadryl Prep: 07/29/23 Prednisone 50 mg 9:30 PM 07/30/23 Prednisone 50 mg 3:30 AM 07/30/23 Prednisone 50 mg and Benadryl 50 mg just prior to leaving home for hospital Patient advised not to drive after taking benadryl  Medication instructions: -Usual morning medications can be taken with sips of water including aspirin 81 mg.  Plan to go home the same day, you will only stay overnight if medically necessary.  You must have responsible adult to drive you home.  Someone must be with you the first 24 hours after you arrive home.  Reviewed procedure instructions with patient.

## 2023-07-30 ENCOUNTER — Other Ambulatory Visit: Payer: Self-pay

## 2023-07-30 ENCOUNTER — Encounter: Payer: Self-pay | Admitting: Physician Assistant

## 2023-07-30 ENCOUNTER — Ambulatory Visit (HOSPITAL_COMMUNITY)
Admission: RE | Admit: 2023-07-30 | Discharge: 2023-07-30 | Disposition: A | Discharge status: Home / Self Care | Payer: Medicare Other | Attending: Cardiovascular Disease | Admitting: Cardiovascular Disease

## 2023-07-30 ENCOUNTER — Other Ambulatory Visit: Payer: Self-pay | Admitting: Physician Assistant

## 2023-07-30 ENCOUNTER — Encounter (HOSPITAL_COMMUNITY)
Admission: RE | Disposition: A | Discharge status: Home / Self Care | Payer: Self-pay | Source: Home / Self Care | Attending: Cardiovascular Disease

## 2023-07-30 DIAGNOSIS — I2510 Atherosclerotic heart disease of native coronary artery without angina pectoris: Secondary | ICD-10-CM | POA: Insufficient documentation

## 2023-07-30 DIAGNOSIS — I2582 Chronic total occlusion of coronary artery: Secondary | ICD-10-CM | POA: Diagnosis not present

## 2023-07-30 DIAGNOSIS — Z87891 Personal history of nicotine dependence: Secondary | ICD-10-CM | POA: Insufficient documentation

## 2023-07-30 DIAGNOSIS — I350 Nonrheumatic aortic (valve) stenosis: Secondary | ICD-10-CM | POA: Diagnosis not present

## 2023-07-30 DIAGNOSIS — Z8249 Family history of ischemic heart disease and other diseases of the circulatory system: Secondary | ICD-10-CM | POA: Diagnosis not present

## 2023-07-30 DIAGNOSIS — Z951 Presence of aortocoronary bypass graft: Secondary | ICD-10-CM | POA: Diagnosis not present

## 2023-07-30 HISTORY — PX: CORONARY/GRAFT ANGIOGRAPHY: CATH118237

## 2023-07-30 SURGERY — CORONARY/GRAFT ANGIOGRAPHY
Anesthesia: LOCAL

## 2023-07-30 MED ORDER — MIDAZOLAM HCL 2 MG/2ML IJ SOLN
INTRAMUSCULAR | Status: DC | PRN
  Administered 2023-07-30 (×2): 1 mg via INTRAVENOUS

## 2023-07-30 MED ORDER — FENTANYL CITRATE (PF) 100 MCG/2ML IJ SOLN
INTRAMUSCULAR | Status: DC | PRN
  Administered 2023-07-30: 25 ug via INTRAVENOUS
  Administered 2023-07-30: 50 ug via INTRAVENOUS

## 2023-07-30 MED ORDER — SODIUM CHLORIDE 0.9 % WEIGHT BASED INFUSION: 1.0000 mL/kg/h | INTRAVENOUS | Status: DC

## 2023-07-30 MED ORDER — IVABRADINE HCL 7.5 MG PO TABS: 7.5000 mg | ORAL_TABLET | ORAL | 0 refills | Status: DC

## 2023-07-30 MED ORDER — LIDOCAINE HCL (PF) 1 % IJ SOLN
INTRAMUSCULAR | Status: DC | PRN
  Administered 2023-07-30: 5 mL

## 2023-07-30 MED ORDER — ACETAMINOPHEN 325 MG PO TABS: 650.0000 mg | ORAL_TABLET | ORAL | Status: DC | PRN

## 2023-07-30 MED ORDER — IOHEXOL 350 MG/ML SOLN
INTRAVENOUS | Status: DC | PRN
  Administered 2023-07-30: 70 mL

## 2023-07-30 MED ORDER — HYDRALAZINE HCL 20 MG/ML IJ SOLN: 10.0000 mg | INTRAMUSCULAR | Status: DC | PRN

## 2023-07-30 MED ORDER — SODIUM CHLORIDE 0.9 % WEIGHT BASED INFUSION: 3.0000 mL/kg/h | INTRAVENOUS | Status: AC

## 2023-07-30 MED ORDER — HEPARIN SODIUM (PORCINE) 1000 UNIT/ML IJ SOLN
INTRAMUSCULAR | Status: DC | PRN
  Administered 2023-07-30: 3500 [IU] via INTRAVENOUS

## 2023-07-30 MED ORDER — MIDAZOLAM HCL 2 MG/2ML IJ SOLN
INTRAMUSCULAR | Status: AC
  Filled 2023-07-30: qty 2

## 2023-07-30 MED ORDER — ASPIRIN 81 MG PO CHEW: 81.0000 mg | CHEWABLE_TABLET | ORAL | Status: DC

## 2023-07-30 MED ORDER — HEPARIN SODIUM (PORCINE) 1000 UNIT/ML IJ SOLN
INTRAMUSCULAR | Status: AC
  Filled 2023-07-30: qty 10

## 2023-07-30 MED ORDER — PREDNISONE 50 MG PO TABS
ORAL_TABLET | ORAL | 0 refills | Status: DC
Start: 2023-07-30 — End: 2023-10-09

## 2023-07-30 MED ORDER — HEPARIN (PORCINE) IN NACL 1000-0.9 UT/500ML-% IV SOLN
INTRAVENOUS | Status: DC | PRN
  Administered 2023-07-30 (×2): 500 mL

## 2023-07-30 MED ORDER — VERAPAMIL HCL 2.5 MG/ML IV SOLN
INTRAVENOUS | Status: AC
  Filled 2023-07-30: qty 2

## 2023-07-30 MED ORDER — LIDOCAINE HCL (PF) 1 % IJ SOLN
INTRAMUSCULAR | Status: AC
Start: 2023-07-30 — End: ?
  Filled 2023-07-30: qty 30

## 2023-07-30 MED ORDER — VERAPAMIL HCL 2.5 MG/ML IV SOLN
INTRAVENOUS | Status: DC | PRN
  Administered 2023-07-30: 10 mL via INTRA_ARTERIAL

## 2023-07-30 MED ORDER — ONDANSETRON HCL 4 MG/2ML IJ SOLN: 4.0000 mg | Freq: Four times a day (QID) | INTRAMUSCULAR | Status: DC | PRN

## 2023-07-30 MED ORDER — FENTANYL CITRATE (PF) 100 MCG/2ML IJ SOLN
INTRAMUSCULAR | Status: AC
  Filled 2023-07-30: qty 2

## 2023-07-30 SURGICAL SUPPLY — 10 items
CATH INFINITI 5 FR IM (CATHETERS) IMPLANT
CATH INFINITI 5FR MULTPACK ANG (CATHETERS) IMPLANT
DEVICE RAD COMP TR BAND LRG (VASCULAR PRODUCTS) IMPLANT
ELECT DEFIB PAD ADLT CADENCE (PAD) IMPLANT
GLIDESHEATH SLEND SS 6F .021 (SHEATH) IMPLANT
GUIDEWIRE INQWIRE 1.5J.035X260 (WIRE) IMPLANT
INQWIRE 1.5J .035X260CM (WIRE) ×1 IMPLANT
KIT SINGLE USE MANIFOLD (KITS) IMPLANT
PACK CARDIAC CATHETERIZATION (CUSTOM PROCEDURE TRAY) ×1 IMPLANT
SET ATX-X65L (MISCELLANEOUS) IMPLANT

## 2023-07-30 NOTE — Discharge Instructions (Addendum)

## 2023-07-30 NOTE — Interval H&P Note (Signed)
 History and Physical Interval Note:  07/30/2023 9:11 AM  Philip Winters  has presented today for surgery, with the diagnosis of aortic stenosis.  The various methods of treatment have been discussed with the patient and family. After consideration of risks, benefits and other options for treatment, the patient has consented to  Procedure(s): LEFT HEART CATH AND CORONARY ANGIOGRAPHY (N/A) as a surgical intervention.  The patient's history has been reviewed, patient examined, no change in status, stable for surgery.  I have reviewed the patient's chart and labs.  Questions were answered to the patient's satisfaction.    Cath Lab Visit (complete for each Cath Lab visit)  Clinical Evaluation Leading to the Procedure:   ACS: No.  Non-ACS:    Anginal Classification: CCS II  Anti-ischemic medical therapy: Minimal Therapy (1 class of medications)  Non-Invasive Test Results: No non-invasive testing performed  Prior CABG: Previous CABG   Verne Carrow

## 2023-07-31 ENCOUNTER — Encounter (HOSPITAL_COMMUNITY): Payer: Self-pay | Admitting: Cardiovascular Disease

## 2023-08-05 DIAGNOSIS — L309 Dermatitis, unspecified: Secondary | ICD-10-CM | POA: Diagnosis not present

## 2023-08-05 DIAGNOSIS — L814 Other melanin hyperpigmentation: Secondary | ICD-10-CM | POA: Diagnosis not present

## 2023-08-05 DIAGNOSIS — L578 Other skin changes due to chronic exposure to nonionizing radiation: Secondary | ICD-10-CM | POA: Diagnosis not present

## 2023-08-05 DIAGNOSIS — L570 Actinic keratosis: Secondary | ICD-10-CM | POA: Diagnosis not present

## 2023-08-12 ENCOUNTER — Other Ambulatory Visit: Payer: Self-pay

## 2023-08-12 MED ORDER — DILTIAZEM HCL ER COATED BEADS 120 MG PO CP24: 240.0000 mg | ORAL_CAPSULE | Freq: Every day | ORAL | 3 refills | Status: AC

## 2023-08-13 ENCOUNTER — Other Ambulatory Visit: Payer: Self-pay

## 2023-08-19 DIAGNOSIS — L309 Dermatitis, unspecified: Secondary | ICD-10-CM | POA: Diagnosis not present

## 2023-08-19 DIAGNOSIS — L400 Psoriasis vulgaris: Secondary | ICD-10-CM | POA: Diagnosis not present

## 2023-08-19 DIAGNOSIS — Z79899 Other long term (current) drug therapy: Secondary | ICD-10-CM | POA: Diagnosis not present

## 2023-08-24 ENCOUNTER — Ambulatory Visit (HOSPITAL_COMMUNITY)
Admission: RE | Admit: 2023-08-24 | Discharge: 2023-08-24 | Disposition: A | Discharge status: Home / Self Care | Source: Ambulatory Visit | Attending: Internal Medicine | Admitting: Internal Medicine

## 2023-08-24 DIAGNOSIS — J439 Emphysema, unspecified: Secondary | ICD-10-CM | POA: Diagnosis not present

## 2023-08-24 DIAGNOSIS — Z01818 Encounter for other preprocedural examination: Secondary | ICD-10-CM | POA: Diagnosis not present

## 2023-08-24 DIAGNOSIS — I350 Nonrheumatic aortic (valve) stenosis: Secondary | ICD-10-CM | POA: Insufficient documentation

## 2023-08-24 DIAGNOSIS — I700 Atherosclerosis of aorta: Secondary | ICD-10-CM | POA: Diagnosis not present

## 2023-08-24 DIAGNOSIS — Z48812 Encounter for surgical aftercare following surgery on the circulatory system: Secondary | ICD-10-CM | POA: Diagnosis not present

## 2023-08-24 MED ORDER — IOHEXOL 350 MG/ML SOLN
95.0000 mL | Freq: Once | INTRAVENOUS | Status: AC | PRN
  Administered 2023-08-24: 95 mL via INTRAVENOUS

## 2023-08-25 ENCOUNTER — Telehealth: Payer: Self-pay | Admitting: Cardiology

## 2023-08-25 ENCOUNTER — Other Ambulatory Visit (HOSPITAL_COMMUNITY): Payer: Self-pay

## 2023-08-25 ENCOUNTER — Telehealth: Payer: Self-pay

## 2023-08-25 NOTE — Telephone Encounter (Signed)
 PA request has been Submitted. New Encounter has been or will be created for follow up. For additional info see Pharmacy Prior Auth telephone encounter from 08/25/23.

## 2023-08-25 NOTE — Telephone Encounter (Signed)
 Pharmacy Patient Advocate Encounter   Received notification from Physician's Office that prior authorization for PRALUENT is required/requested.   Insurance verification completed.   The patient is insured through [facility] MEDIC[employer] .   Per test claim: PA required; PA submitted to above mentioned insurance via CoverMyMeds Key/confirmation #/EOC [id number] Status is pending

## 2023-08-25 NOTE — Telephone Encounter (Signed)
 Pt c/o medication issue:  1. Name of Medication:   Alirocumab (PRALUENT) 75 MG/ML SOAJ   2. How are you currently taking this medication (dosage and times per day)?   3. Are you having a reaction (difficulty breathing--STAT)?   4. What is your medication issue?   Caller Truddie Hidden) is following-up on fax sent regarding patient's prior authorization for Alirocumab (PRALUENT) 75 MG/ML SOAJ.  Caller wants a call back to confirm fax received and status of patient's prior authorization.

## 2023-08-26 ENCOUNTER — Other Ambulatory Visit (HOSPITAL_COMMUNITY): Payer: Self-pay

## 2023-08-26 DIAGNOSIS — Z79899 Other long term (current) drug therapy: Secondary | ICD-10-CM | POA: Diagnosis not present

## 2023-08-26 DIAGNOSIS — L400 Psoriasis vulgaris: Secondary | ICD-10-CM | POA: Diagnosis not present

## 2023-08-26 NOTE — Telephone Encounter (Signed)
 Pharmacy Patient Advocate Encounter  Received notification from [facility] that Prior Authorization for PRALUENT has been APPROVED from 08/25/23 to 08/24/24

## 2023-08-31 ENCOUNTER — Ambulatory Visit: Payer: Medicare Other | Admitting: Orthopedic Surgery

## 2023-08-31 DIAGNOSIS — M65321 Trigger finger, right index finger: Secondary | ICD-10-CM | POA: Diagnosis not present

## 2023-08-31 DIAGNOSIS — M19132 Post-traumatic osteoarthritis, left wrist: Secondary | ICD-10-CM | POA: Diagnosis not present

## 2023-08-31 MED ORDER — LIDOCAINE HCL 1 % IJ SOLN
1.0000 mL | INTRAMUSCULAR | Status: AC | PRN
  Administered 2023-08-31: 1 mL

## 2023-08-31 MED ORDER — BETAMETHASONE SOD PHOS & ACET 6 (3-3) MG/ML IJ SUSP
6.0000 mg | INTRAMUSCULAR | Status: AC | PRN
  Administered 2023-08-31: 6 mg via INTRA_ARTICULAR

## 2023-08-31 NOTE — Progress Notes (Signed)
 Philip Winters - 63 y.o. male MRN [id number]  Date of birth: 01-06-1961  Office Visit Note: Visit Date: 08/31/2023 PCP: Etta Grandchild, MD Referred by: Etta Grandchild, MD  Subjective:  HPI: Philip Winters is a 63 y.o. male who presents today for follow up 24 weeks status post left wrist proximal row carpectomy with placement of allograft spacer, left wrist thumb cmc denervation, left wrist first extensor compartment release.  He continues to progress with activities as tolerated, strength is improving.  He is also describing hand soreness at the volar base of the right index finger.  Does have a history of prior trigger digits, feels that this may be an oncoming trigger digit given the pain in this region with occasional clicking.  Pertinent ROS were reviewed with the patient and found to be negative unless otherwise specified above in HPI.   Assessment & Plan: Visit Diagnoses:  1. Trigger finger, right index finger   2. Slac (scapholunate advanced collapse) of wrist, left     Plan: He is progressing postoperatively per protocol.  I am pleased to see that his strength is improving.  We discussed his ongoing stiffness at the left wrist, however he does have some preserved range of motion which should hopefully continue to improve with ongoing home exercises and activities.  His strength is also likely to continue improving given that pain is not his limiting factor.  This was discussed in detail today, expressed full understanding.  As for the right index finger, extensive discussion was had with the patient today regarding his ongoing right index trigger digit.  We discussed the etiology and pathophysiology of stenosing tenosynovitis.  We discussed conservative versus surgical treatment modalities.  From a conservative standpoint, we discussed activity modification, splinting, therapy and injections.  From a surgical standpoint, we discussed the possibility for trigger digit release as well as  all risk and benefits associated.  Given that he has not trialed conservative treatment, patient is appropriate candidate for cortisone injection to the right index finger A1 pulley for symptom relief.  Risks and benefit of the cortisone injection were discussed in detail, patient agreed to proceed.  Injection was provided today without issue, patient will return as needed moving forward if symptoms fail to improve.  I recommended that he return in approximately 3 months time to recheck the left wrist, at that juncture we can recheck the index finger as well should remain symptomatic.   Follow-up: No follow-ups on file.   Meds & Orders: No orders of the defined types were placed in this encounter.  No orders of the defined types were placed in this encounter.    Procedures: Hand/UE Inj for trigger finger on 08/31/2023 10:42 AM Indications: pain Details: 25 G needle, volar approach Medications: 1 mL lidocaine 1 %; 6 mg betamethasone acetate-betamethasone sodium phosphate 6 (3-3) MG/ML Outcome: tolerated well, no immediate complications Consent was given by the patient. Patient was prepped and draped in the usual sterile fashion.           Objective:   Vital Signs: There were no vitals taken for this visit.  Ortho Exam Left wrist: - Well-healed dorsal incision over the wrist and thumb glabrous/nonglabrous junction, moderate ongoing dorsally raised area over the wrist, firm - Wrist range of motion flexion 25, extension 15 - Grip strength Jamar 2 right 60, left 40  Right hand - Tenderness over the index finger A1 pulley, able to perform composite fist without notable locking  Imaging:  No results found.   Staley Budzinski Alvia Jointer, M.D. [facility], Hand Surgery

## 2023-09-07 ENCOUNTER — Institutional Professional Consult (permissible substitution): Admitting: Surgery

## 2023-09-07 ENCOUNTER — Encounter: Payer: Self-pay | Admitting: Surgery

## 2023-09-07 VITALS — BP 129/81 | HR 100 | Resp 20 | Ht 68.0 in | Wt 150.0 lb

## 2023-09-07 DIAGNOSIS — I350 Nonrheumatic aortic (valve) stenosis: Secondary | ICD-10-CM

## 2023-09-07 NOTE — Progress Notes (Signed)
 Patient ID: Philip Winters, male   DOB: 03-Feb-1961, 63 y.o.   MRN: [id number]    [facility]       [address]       Philip Winters [id number]             [phone]          CARDIOTHORACIC SURGERY CONSULTATION REPORT  PCP is [name], MD Referring Provider is [name], MD Primary Cardiologist is [name], MD  Reason for consultation: Severe aortic stenosis  HPI:  The patient is a 63 year old gentleman with a history of hypertension, hyperlipidemia, coronary artery disease status post CABG x 2 in December 2021 by Dr. [name], bicuspid aortic valve stenosis, thoracic aortic aneurysm, spinal stenosis and peripheral neuropathy status post back surgery x 2, who was referred for consideration of aortic valve replacement.  His most recent echo on 06/30/2023 showed a severely calcified and thickened aortic valve with restricted leaflet mobility.  The mean gradient was 26 mmHg with a peak gradient of 45.4 mmHg.  Aortic valve area by VTI was 0.49 cm with a dimensionless index of 0.16 and a low stroke-volume index of 18.  Left ventricular ejection fraction was 50 to 55%.  The mean gradient across his aortic valve 1 year ago was unchanged at 26 mmHg although the valve area at that time was measured at 1.06 cm with a dimensionless index of 0.26.  He is here today with his wife.  He has had some episodes of shortness of breath with exertion which have been more episodic than progressive.  He is physically active and goes to the gym several days per week.  He denies any exertional fatigue.  Has had no chest pain or pressure.  Has had no dizziness or syncope.  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  He has had a tough time over the past few years undergoing 2 back surgeries, left knee replacement, and left wrist surgery.   Past Medical History:  Diagnosis Date   Allergy    Arthritis    CAD  (coronary artery disease)    GERD (gastroesophageal reflux disease)    occ   High cholesterol    Hypertension    Peripheral neuropathy    Right carotid bruit 06/10/2017   Sciatica    Seasonal allergies    Severe aortic stenosis    Sinus tachycardia    Spinal stenosis     Past Surgical History:  Procedure Laterality Date   CARDIAC CATHETERIZATION     CARPAL TUNNEL WITH CUBITAL TUNNEL Left 11/10/2013   Procedure: LEFT CARPAL TUNNEL RELEASE;  Surgeon: Amelie Baize, MD;  Location: [facility];  Service: Orthopedics;  Laterality: Left;   CARPECTOMY Left 03/13/2023   Procedure: LEFT WRIST PROXIMAL ROW CARPECTOMY WITH PLACEMENT ALLOGRAFT SPACER / THUMB CMC DENERVATION;  Surgeon: Merrill Abide, MD;  Location: [facility];  Service: Orthopedics;  Laterality: Left;   COLONOSCOPY  2023   CORONARY ARTERY BYPASS GRAFT N/A 05/01/2020   Procedure: CORONARY ARTERY BYPASS GRAFTING (CABG) X 2 ON CARDIOPULMONARY BYPASS. LIMA TO LAD, SVG TO DIAG;  Surgeon: Heriberto London, MD;  Location: [facility] OR;  Service: Open Heart Surgery;  Laterality: N/A;   CORONARY/GRAFT ANGIOGRAPHY N/A 07/30/2023   Procedure: CORONARY/GRAFT ANGIOGRAPHY;  Surgeon: Odie Benne, MD;  Location: [facility];  Service: Cardiovascular;  Laterality: N/A;  ENDOVEIN HARVEST OF GREATER SAPHENOUS VEIN Right 05/01/2020   Procedure: ENDOVEIN HARVEST OF GREATER SAPHENOUS VEIN;  Surgeon: Heriberto London, MD;  Location: [facility] OR;  Service: Open Heart Surgery;  Laterality: Right;   KNEE ARTHROSCOPY  [id number]   left and right   LUMBAR LAMINECTOMY/DECOMPRESSION MICRODISCECTOMY Left 09/29/2017   Procedure: LEFT LUMBAR TWO - LUMBAR THREELAMINOTOMY/MICRODISCECTOMY;  Surgeon: Yvonna Herder, MD;  Location: [facility];  Service: Neurosurgery;  Laterality: Left;  LEFT LUMBAR 2- LUMBAR 3 LAMINOTOMY/MICRODISCECTOMY   NASAL SINUS SURGERY  05/2015   RIGHT/LEFT HEART CATH AND CORONARY ANGIOGRAPHY N/A  04/10/2020   Procedure: RIGHT/LEFT HEART CATH AND CORONARY ANGIOGRAPHY;  Surgeon: Millicent Ally, MD;  Location: [facility];  Service: Cardiovascular;  Laterality: N/A;   TEE WITHOUT CARDIOVERSION N/A 05/01/2020   Procedure: TRANSESOPHAGEAL ECHOCARDIOGRAM (TEE);  Surgeon: Matt Song, Donata Fryer, MD;  Location: [facility] OR;  Service: Open Heart Surgery;  Laterality: N/A;   TOTAL KNEE ARTHROPLASTY Right 06/24/2016   Procedure: TOTAL KNEE ARTHROPLASTY;  Surgeon: Shirlee Dotter, MD;  Location: [facility];  Service: Orthopedics;  Laterality: Right;   TOTAL KNEE ARTHROPLASTY Left 12/18/2020   Procedure: LEFT TOTAL KNEE ARTHROPLASTY;  Surgeon: Shirlee Dotter, MD;  Location: [facility];  Service: Orthopedics;  Laterality: Left;   TRANSFORAMINAL LUMBAR INTERBODY FUSION W/ MIS 2 LEVEL Left 04/15/2022   Procedure: Minimally Invasive Surgery Decompression and Transforaminal Lumbar Interbody Fusion , Left , Lumbar three-four, Lumbar four-five;  Surgeon: Dawley, Colby Daub, DO;  Location: [facility] OR;  Service: Neurosurgery;  Laterality: Left;   TRIGGER FINGER RELEASE Left 11/10/2013   Procedure: LEFT INDEX A-1 PULLEY RELEASE;  Surgeon: Amelie Baize, MD;  Location: [facility];  Service: Orthopedics;  Laterality: Left;   ULNAR NERVE TRANSPOSITION Left 11/10/2013   Procedure: ULNAR NERVE TRANSPOSITION;  Surgeon: Amelie Baize, MD;  Location: [facility];  Service: Orthopedics;  Laterality: Left;    Family History  Problem Relation Age of Onset   Hypertension Mother    Throat cancer Mother    COPD Mother    Hypertension Father    Cancer Father        Mesothelioma   Colon cancer Neg Hx    Esophageal cancer Neg Hx    Stomach cancer Neg Hx    Rectal cancer Neg Hx    Colon polyps Neg Hx     Social History   Socioeconomic History   Marital status: Married    Spouse name: Trevor Fudge   Number of children: 0   Years of education: 12   Highest education level: Not on file   Occupational History   Occupation: Self employed    Comment: Nutritional therapist   Occupation: Retired Nutritional therapist  Tobacco Use   Smoking status: Former    Current packs/day: 0.00    Average packs/day: 1 pack/day for 37.0 years (37.0 ttl pk-yrs)    Types: Cigarettes    Start date: 05/05/1979    Quit date: 05/04/2016    Years since quitting: 7.3   Smokeless tobacco: Never  Vaping Use   Vaping status: Former  Substance and Sexual Activity   Alcohol  use: Yes    Alcohol /week: 5.0 standard drinks of alcohol     Types: 5 Glasses of wine per week    Comment: weekends only with dinner   Drug use: No   Sexual activity: Yes  Other Topics Concern   Not on file  Social History Narrative   Lives with wife, Trevor Fudge  Caffeine use: 1 Coffee daily   Right handed    Social Drivers of Corporate investment banker Strain: Not on file  Food Insecurity: Not on file  Transportation Needs: Not on file  Physical Activity: Not on file  Stress: Not on file  Social Connections: Not on file  Intimate Partner Violence: Not on file    Prior to Admission medications   Medication Sig Start Date End Date Taking? Authorizing Provider  acetaminophen  (TYLENOL ) 500 MG tablet Take 500-1,000 mg by mouth every 6 (six) hours as needed (pain.).   Yes [provider]  Alirocumab  (PRALUENT ) 75 MG/ML SOAJ Inject 1 mL (75 mg total) into the skin every 14 (fourteen) days. 05/05/23  Yes Hugh Madura, MD  aspirin  EC 81 MG tablet Take 1 tablet (81 mg total) by mouth in the morning. Swallow whole. 04/24/22  Yes Dawley, Troy C, DO  betamethasone  dipropionate (DIPROLENE ) 0.05 % ointment Apply 1 Application topically 2 (two) times daily as needed (skin irritation.).   Yes [provider]  CARTIA  XT 240 MG 24 hr capsule Take 240 mg by mouth daily. 05/09/23  Yes [provider]  diltiazem  (CARDIZEM  CD) 120 MG 24 hr capsule Take 2 capsules (240 mg total) by mouth daily. 08/12/23  Yes Hugh Madura, MD   diphenhydrAMINE  (BENADRYL ) 50 MG tablet Take 1 tablet by mouth with final dose of Prednisone  50mg  07/16/23  Yes Odie Benne, MD  doxycycline  (VIBRAMYCIN ) 100 MG capsule Take 2 tablets by mouth 1 hour prior to dental procedure. 04/14/23  Yes Wes Hamman, MD  fluticasone  (FLONASE ) 50 MCG/ACT nasal spray Place 2 sprays into both nostrils daily as needed for allergies.   Yes [provider]  hydrOXYzine  (ATARAX ) 50 MG tablet Take 50 mg by mouth at bedtime as needed (itching/sleep).   Yes [provider]  ivabradine  (CORLANOR) 7.5 MG TABS tablet Take 1 tablet (7.5 mg total) by mouth as directed. Take 2 hours prior to CT scan to slow HR for better imaging 07/30/23  Yes Ardia Kraft, PA-C  methocarbamol  (ROBAXIN ) 500 MG tablet Take 500 mg by mouth every 8 (eight) hours as needed for muscle spasms.   Yes [provider]  Multiple Vitamin (MULTIVITAMIN WITH MINERALS) TABS tablet Take 1 tablet by mouth daily.   Yes [provider]  olmesartan  (BENICAR ) 5 MG tablet Take 2 tablets (10 mg total) by mouth in the morning. 07/16/23  Yes Hugh Madura, MD  Polyethyl Glycol-Propyl Glycol (LUBRICANT EYE DROPS) 0.4-0.3 % SOLN Place 1-2 drops into both eyes 3 (three) times daily as needed (dry/irritated eyes).   Yes [provider]  predniSONE  (DELTASONE ) 50 MG tablet Take 1 tablet 13 hours prior to CTs, then take 1 tablet 7 hours prior to CTs and take 1 tablet 1 hour prior to CTs. 07/30/23  Yes Ardia Kraft, PA-C  traMADol  (ULTRAM ) 50 MG tablet Take 50 mg by mouth every 6 (six) hours as needed for moderate pain (pain score 4-6).   Yes [provider]    Current Outpatient Medications  Medication Sig Dispense Refill   acetaminophen  (TYLENOL ) 500 MG tablet Take 500-1,000 mg by mouth every 6 (six) hours as needed (pain.).     Alirocumab  (PRALUENT ) 75 MG/ML SOAJ Inject 1 mL (75 mg total) into the skin every 14 (fourteen) days. 2 mL 11    aspirin  EC 81 MG tablet Take 1 tablet (81 mg total) by mouth in the morning. Swallow whole.  30 tablet 12   betamethasone  dipropionate (DIPROLENE ) 0.05 % ointment Apply 1 Application topically 2 (two) times daily as needed (skin irritation.).     CARTIA  XT 240 MG 24 hr capsule Take 240 mg by mouth daily.     diltiazem  (CARDIZEM  CD) 120 MG 24 hr capsule Take 2 capsules (240 mg total) by mouth daily. 180 capsule 3   diphenhydrAMINE  (BENADRYL ) 50 MG tablet Take 1 tablet by mouth with final dose of Prednisone  50mg  1 tablet 0   doxycycline  (VIBRAMYCIN ) 100 MG capsule Take 2 tablets by mouth 1 hour prior to dental procedure. 4 capsule 2   fluticasone  (FLONASE ) 50 MCG/ACT nasal spray Place 2 sprays into both nostrils daily as needed for allergies.     hydrOXYzine  (ATARAX ) 50 MG tablet Take 50 mg by mouth at bedtime as needed (itching/sleep).     ivabradine  (CORLANOR) 7.5 MG TABS tablet Take 1 tablet (7.5 mg total) by mouth as directed. Take 2 hours prior to CT scan to slow HR for better imaging 1 tablet 0   methocarbamol  (ROBAXIN ) 500 MG tablet Take 500 mg by mouth every 8 (eight) hours as needed for muscle spasms.     Multiple Vitamin (MULTIVITAMIN WITH MINERALS) TABS tablet Take 1 tablet by mouth daily.     olmesartan  (BENICAR ) 5 MG tablet Take 2 tablets (10 mg total) by mouth in the morning. 180 tablet 3   Polyethyl Glycol-Propyl Glycol (LUBRICANT EYE DROPS) 0.4-0.3 % SOLN Place 1-2 drops into both eyes 3 (three) times daily as needed (dry/irritated eyes).     predniSONE  (DELTASONE ) 50 MG tablet Take 1 tablet 13 hours prior to CTs, then take 1 tablet 7 hours prior to CTs and take 1 tablet 1 hour prior to CTs. 3 tablet 0   traMADol  (ULTRAM ) 50 MG tablet Take 50 mg by mouth every 6 (six) hours as needed for moderate pain (pain score 4-6).     No current facility-administered medications for this visit.    Allergies  Allergen Reactions   Mushroom Extract Complex (Obsolete) Anaphylaxis   Penicillins  Anaphylaxis    Childhood allergy.  Has patient had a PCN reaction causing immediate rash, facial/tongue/throat swelling, SOB or lightheadedness with hypotension: No Has patient had a PCN reaction causing severe rash involving mucus membranes or skin necrosis: No Has patient had a PCN reaction that required hospitalization No Has patient had a PCN reaction occurring within the last 10 years: No If all of the above answers are "NO", then may proceed with C   Chantix [Varenicline] Other (See Comments)    headaches    Crestor [Rosuvastatin] Other (See Comments)    myalgia   Dilaudid  [Hydromorphone ] Nausea And Vomiting   Lipitor [Atorvastatin] Other (See Comments)    myalgia   Losartan Potassium Other (See Comments)    Lethargic    Lovaza [Omega-3-Acid Ethyl Esters (Fish)] Other (See Comments)    myalgia   Oxycodone  Hcl Other (See Comments)    hallucinations   Pravachol [Pravastatin] Other (See Comments)    myalgia   Bystolic [Nebivolol Hcl] Other (See Comments)    Extreme fatigue, not able to focus   Codeine Nausea And Vomiting   Iodine Rash   Iohexol  Rash   Lopressor  [Metoprolol ] Other (See Comments)    Made patient very fatigued, could not focus   Red Dye #40 (Allura Red) Rash   Repatha  [Evolocumab ] Rash and Other (See Comments)    Hip pain and rash      Review  of Systems:   General:  normal appetite, normal energy, no weight gain, no weight loss, no fever  Cardiac:  no chest pain with exertion, no chest pain at rest, +SOB with moderat exertion, no resting SOB, no PND, no orthopnea, no palpitations, no arrhythmia, no atrial fibrillation, no LE edema, no dizzy spells, no syncope  Respiratory:  + exertional shortness of breath, no home oxygen, no productive cough, no dry cough, no bronchitis, no wheezing, no hemoptysis, no asthma, no pain with inspiration or cough, no sleep apnea, no CPAP at night  GI:   no difficulty swallowing, no reflux, no frequent heartburn, no hiatal  hernia, no abdominal pain, no constipation, no diarrhea, no hematochezia, no hematemesis, no melena  GU:   no dysuria,  no frequency, no urinary tract infection, no hematuria, no enlarged prostate, no kidney stones, no kidney disease  Vascular:  no pain suggestive of claudication, no pain in feet, no leg cramps, no varicose veins, no DVT, no non-healing foot ulcer  Neuro:   no stroke, no TIA's, no seizures, no headaches, no temporary blindness one eye,  no slurred speech, no peripheral neuropathy, no chronic pain, no instability of gait, no memory/cognitive dysfunction  Musculoskeletal: + arthritis - primarily involving the spine, no joint swelling, no myalgias, no difficulty walking, normal mobility   Skin:   no rash, no itching, no skin infections, no pressure sores or ulcerations  Psych:   no anxiety, no depression, no nervousness, no unusual recent stress  Eyes:   no blurry vision, no floaters, no recent vision changes, + wears glasses   ENT:   no hearing loss, no loose or painful teeth, no dentures, see dentist regularly  Hematologic:  no easy bruising, no abnormal bleeding, no clotting disorder, no frequent epistaxis  Endocrine:  no diabetes, does not check CBG's at home     Physical Exam:   BP 129/81   Pulse 100   Resp 20   Ht 5\' 8"  (1.727 m)   Wt 150 lb (68 kg)   SpO2 97% Comment: RA  BMI 22.81 kg/m   General:  well-appearing  HEENT:  Unremarkable, NCAT, PERLA, EOMI  Neck:   no JVD, no bruits, no adenopathy   Chest:   clear to auscultation, symmetrical breath sounds, no wheezes, no rhonchi   CV:   RRR, 3/6 systolic murmur RSB, no diastolic murmur  Abdomen:  soft, non-tender, no masses   Extremities:  warm, well-perfused, pedal pulses not palpable, no lower extremity edema  Rectal/GU  Deferred  Neuro:   Grossly non-focal and symmetrical throughout  Skin:   Clean and dry, no rashes, no breakdown  Diagnostic Tests:    ECHOCARDIOGRAM REPORT       Patient Name:   MAKSYM PFIFFNER Date of Exam: 06/30/2023  Medical Rec #:  [id number]   Height:       68.0 in  Accession #:    [id number]  Weight:       150.4 lb  Date of Birth:  1960-12-09   BSA:          1.810 m  Patient Age:    62 years    BP:           120/75 mmHg  Patient Gender: M           HR:           93 bpm.  Exam Location:  Outpatient   Procedure: 2D Echo, 3D Echo, Cardiac Doppler, Color Doppler  and Strain  Analysis   Indications:   Aortic Valve Disorder    History:        Patient has prior history of Echocardiogram examinations,  most                 recent 06/19/2022. CAD, Arrythmias:Tachycardia; Risk                  Factors:Hypertension, Former Smoker and Dyslipidemia.  Bicuspid                 Aortic Valve.    Sonographer:    Gelene Kelly RDCS  Referring Phys: 51 TESSA N CONTE   IMPRESSIONS     1. Abnormal septal motion inferior apical hypokinesis EF less robust  comared to prior TTE 07/10/22. Left ventricular ejection fraction, by  estimation, is 50 to 55%. The left ventricle has low normal function. The  left ventricle has no regional wall  motion abnormalities. There is mild left ventricular hypertrophy. Left  ventricular diastolic parameters are consistent with Grade I diastolic  dysfunction (impaired relaxation). The average left ventricular global  longitudinal strain is -12.7 %. The  global longitudinal strain is abnormal.   2. Right ventricular systolic function is normal. The right ventricular  size is normal.   3. The mitral valve is normal in structure. No evidence of mitral valve  regurgitation. No evidence of mitral stenosis.   4. Not well seen indicated to be tri leaflet by prior TEE Heavily  calcified. Gradients similar to prior echo 07/10/22 but calcuated AVA and  DVI lower due to lower measured LVOT TVI and LVOT diamter. . The aortic  valve is normal in structure. There is  severe calcifcation of the aortic valve. There is severe thickening of the  aortic valve.  Aortic valve regurgitation is not visualized. Moderate to  severe aortic valve stenosis.   5. Aortic dilatation noted. There is mild dilatation of the ascending  aorta, measuring 40 mm.   6. The inferior vena cava is normal in size with greater than 50%  respiratory variability, suggesting right atrial pressure of 3 mmHg.   FINDINGS   Left Ventricle: Abnormal septal motion inferior apical hypokinesis EF  less robust comared to prior TTE 07/10/22. Left ventricular ejection  fraction, by estimation, is 50 to 55%. The left ventricle has low normal  function. The left ventricle has no  regional wall motion abnormalities. The average left ventricular global  longitudinal strain is -12.7 %. The global longitudinal strain is  abnormal. The left ventricular internal cavity size was normal in size.  There is mild left ventricular hypertrophy.  Left ventricular diastolic parameters are consistent with Grade I  diastolic dysfunction (impaired relaxation).   Right Ventricle: The right ventricular size is normal. No increase in  right ventricular wall thickness. Right ventricular systolic function is  normal.   Left Atrium: Left atrial size was normal in size.   Right Atrium: Right atrial size was normal in size.   Pericardium: There is no evidence of pericardial effusion.   Mitral Valve: The mitral valve is normal in structure. No evidence of  mitral valve regurgitation. No evidence of mitral valve stenosis.   Tricuspid Valve: The tricuspid valve is normal in structure. Tricuspid  valve regurgitation is mild . No evidence of tricuspid stenosis.   Aortic Valve: Not well seen indicated to be tri leaflet by prior TEE  Heavily calcified. Gradients similar to prior echo 07/10/22 but calcuated  AVA and DVI lower  due to lower measured LVOT TVI and LVOT diamter. The  aortic valve is normal in structure.  There is severe calcifcation of the aortic valve. There is severe  thickening of the aortic  valve. Aortic valve regurgitation is not  visualized. Aortic regurgitation PHT measures 281 msec. Moderate to severe  aortic stenosis is present. Aortic valve mean  gradient measures 26.0 mmHg. Aortic valve peak gradient measures 45.4  mmHg. Aortic valve area, by VTI measures 0.49 cm.   Pulmonic Valve: The pulmonic valve was normal in structure. Pulmonic valve  regurgitation is not visualized. No evidence of pulmonic stenosis.   Aorta: Aortic dilatation noted. There is mild dilatation of the ascending  aorta, measuring 40 mm.   Venous: The inferior vena cava is normal in size with greater than 50%  respiratory variability, suggesting right atrial pressure of 3 mmHg.   IAS/Shunts: No atrial level shunt detected by color flow Doppler.     LEFT VENTRICLE  PLAX 2D  LVIDd:         3.84 cm   Diastology  LVIDs:         2.95 cm   LV e' medial:    6.96 cm/s  LV PW:         1.23 cm   LV E/e' medial:  9.3  LV IVS:        1.12 cm   LV e' lateral:   14.30 cm/s  LVOT diam:     2.00 cm   LV E/e' lateral: 4.5  LV SV:         33  LV SV Index:   18        2D Longitudinal Strain  LVOT Area:     3.14 cm  2D Strain GLS Avg:     -12.7 %                             3D Volume EF:                           3D EF:        49 %                           LV EDV:       154 ml                           LV ESV:       79 ml                           LV SV:        75 ml   RIGHT VENTRICLE  RV Basal diam:  2.45 cm  RV Mid diam:    2.02 cm  RV S prime:     12.30 cm/s  TAPSE (M-mode): 1.2 cm   LEFT ATRIUM             Index        RIGHT ATRIUM           Index  LA diam:        3.50 cm 1.93 cm/m   RA Area:     18.10 cm  LA Vol (A2C):   39.8 ml 21.98 ml/m  RA Volume:   51.70 ml  28.56 ml/m  LA Vol (A4C):   35.7 ml 19.72 ml/m  LA Biplane Vol: 40.6 ml 22.43 ml/m   AORTIC VALVE  AV Area (Vmax):    0.60 cm  AV Area (Vmean):   0.56 cm  AV Area (VTI):     0.49 cm  AV Vmax:           337.00 cm/s  AV  Vmean:          234.000 cm/s  AV VTI:            0.668 m  AV Peak Grad:      45.4 mmHg  AV Mean Grad:      26.0 mmHg  LVOT Vmax:         64.00 cm/s  LVOT Vmean:        41.400 cm/s  LVOT VTI:          0.104 m  LVOT/AV VTI ratio: 0.16  AI PHT:            281 msec  AR Vena Contracta: 0.53 cm    AORTA  Ao Root diam: 3.60 cm  Ao Asc diam:  4.00 cm   MITRAL VALVE  MV Area (PHT): 4.36 cm    SHUNTS  MV Decel Time: 174 msec    Systemic VTI:  0.10 m  MV E velocity: 65.00 cm/s  Systemic Diam: 2.00 cm  MV A velocity: 84.90 cm/s  MV E/A ratio:  0.77   Philip Mediate MD  Electronically signed by Philip Mediate MD  Signature Date/Time: 06/30/2023/12:21:34 PM        Final     Physicians  Panel Physicians Referring Physician Case Authorizing Physician  Odie Benne, MD (Primary)     Procedures  CORONARY/GRAFT ANGIOGRAPHY   Conclusion      1st Diag lesion is 100% stenosed.   Prox LAD to Mid LAD lesion is 90% stenosed.   Mid Cx lesion is 30% stenosed.   Prox RCA lesion is 90% stenosed.   SVG graft was visualized by angiography and is normal in caliber.   LIMA graft was visualized by angiography and is normal in caliber.   Severe two vessel CAD s/p 2V CABG with 2/2 patent bypass grafts Severe mid LAD stenosis. Chronic occlusion of the diagonal branch after the aneurysmal segment. Patent LIMA to the mid LAD. Patent vein graft to the Diagonal branch.  Patent dominant Circumflex with mild non-obstructive disease Small non-dominant RCA with chronic severe mid vessel stenosis, unchanged from last cath.    Recommendations: Continue workup for surgical AVR vs TAVR. Medical management of CAD   Indications  Severe aortic stenosis [I35.0 (ICD-10-CM)]  Coronary artery disease involving native coronary artery of native heart without angina pectoris [I25.10 (ICD-10-CM)]   Procedural Details  Technical Details Indication: CAD with prior 2V CABG, now with severe AS.   Procedure:  The risks, benefits, complications, treatment options, and expected outcomes were discussed with the patient. The patient and/or family concurred with the proposed plan, giving informed consent. The patient was sedated with Versed  and Fentanyl . The left wrist was prepped and draped in a sterile fashion. 1% lidocaine  was used for local anesthesia. Using the modified Seldinger access technique, a 5 French sheath was placed in the left radial artery. 3 mg Verapamil  was given through the sheath. Weight based IV heparin  was given. Standard diagnostic catheters were used to perform selective coronary angiography. I engaged the native RCA and vein graft to the Diagonal with  the JR4 catheter. I engaged the left main with a JL4 catheter. I engaged the LIMA graft with an IMA catheter. I did not cross the aortic valve. All catheter exchanges were performed over an exchange length guidewire.   The sheath was removed from the right radial artery and a hemostasis band was applied at the arteriotomy site on the right wrist.      Estimated blood loss <50 mL.   During this procedure medications were administered to achieve and maintain moderate conscious sedation while the patient's heart rate, blood pressure, and oxygen saturation were continuously monitored and I was present face-to-face 100% of this time. Sharren Decree Rad Tech and [employer] Specialist are independent, trained observers who assisted in the monitoring of the patient's level of consciousness.   Medications (Filter: Administrations occurring from 1008 to 1106 on 07/30/23) midazolam  (VERSED ) injection (mg)  Total dose: 2 mg Date/Time Rate/Dose/Volume Action   07/30/23 1026 1 mg Given   1039 1 mg Given   fentaNYL  (SUBLIMAZE ) injection (mcg)  Total dose: 75 mcg Date/Time Rate/Dose/Volume Action   07/30/23 1026 50 mcg Given   1039 25 mcg Given   lidocaine  (PF) (XYLOCAINE ) 1 % injection (mL)  Total volume: 5 mL Date/Time  Rate/Dose/Volume Action   07/30/23 1036 5 mL Given   Radial Cocktail/Verapamil  only (mL)  Total volume: 10 mL Date/Time Rate/Dose/Volume Action   07/30/23 1036 10 mL Given   heparin  sodium (porcine) injection (Units)  Total dose: 3,500 Units Date/Time Rate/Dose/Volume Action   07/30/23 1040 3,500 Units Given   Heparin  (Porcine) in NaCl 1000-0.9 UT/500ML-% SOLN (mL)  Total volume: 1,000 mL Date/Time Rate/Dose/Volume Action   07/30/23 1040 500 mL Given   1040 500 mL Given   iohexol  (OMNIPAQUE ) 350 MG/ML injection (mL)  Total volume: 70 mL Date/Time Rate/Dose/Volume Action   07/30/23 1055 70 mL Given    Sedation Time  Sedation Time Physician-1: 27 minutes 16 seconds Contrast     Administrations occurring from 1008 to 1106 on 07/30/23:  Medication Name Total Dose  iohexol  (OMNIPAQUE ) 350 MG/ML injection 70 mL   Radiation/Fluoro  Fluoro time: 6.8 (min) DAP: 8453 (mGycm2) Cumulative Air Kerma: 138 (mGy) Complications  Complications documented before study signed (07/30/2023 11:07 AM)   No complications were associated with this study.  Documented by Alinda Apley, RCIS - 07/30/2023 10:19 AM     Coronary Findings  Diagnostic Dominance: Left Left Anterior Descending  Vessel is large.  Prox LAD to Mid LAD lesion is 90% stenosed.    First Diagonal Branch  Vessel is moderate in size.  1st Diag lesion is 100% stenosed. The lesion is chronically occluded.    Left Circumflex  Vessel is large.  Mid Cx lesion is 30% stenosed.    Right Coronary Artery  Vessel is small.  Prox RCA lesion is 90% stenosed.    Saphenous Graft To 1st Diag  SVG graft was visualized by angiography and is normal in caliber.    LIMA LIMA Graft To Dist LAD  LIMA graft was visualized by angiography and is normal in caliber.    Intervention   No interventions have been documented.   Coronary Diagrams  Diagnostic Dominance: Left  Intervention   Implants   No implant  documentation for this case.   Syngo Images   Show images for CARDIAC CATHETERIZATION Images on Long Term Storage   Show images for Mingo, Siegert to Procedure Log  Procedure Log    Hemo Data  Flowsheet Row Most Recent Value  AO Systolic Pressure 128 mmHg  AO Diastolic Pressure 80 mmHg  AO Mean 103 mmHg   ADDENDUM REPORT: 08/29/2023 19:58   EXAM: OVER-READ INTERPRETATION  CT CHEST   The following report is an over-read performed by radiologist Dr. Violet Grew [facility] on 08/29/2023. This over-read does not include interpretation of cardiac or coronary anatomy or pathology. The cardiac TAVR CT interpretation by the cardiologist is attached.   COMPARISON:  CT angio chest abdomen pelvis 08/24/2023, CT chest 10/06/2022   FINDINGS: Visualized portions of the lungs are clear.   No lymphadenopathy.   No acute upper abdominal abnormality.   No acute osseous abnormality.   No acute soft tissue abnormality. Multilevel degenerative changes of the spine. Intact sternotomy wires.   IMPRESSION: No acute abnormality.     Electronically Signed   By: Morgane  Naveau M.D.   On: 08/29/2023 19:58    Addended by Naveau, Morgane E, MD on 08/29/2023  8:01 PM    Study Result  Narrative & Impression  CLINICAL DATA:  Aortic Valve pathology with assessment for TAVR   EXAM: Cardiac TAVR CT   TECHNIQUE: The patient was scanned on a Siemens Force 192 slice scanner. A 120 kV retrospective scan was triggered in the descending thoracic aorta at 111 HU's. Gantry rotation speed was 270 msecs and collimation was .9 mm. No beta blockade or nitro were given. The 3D data set was reconstructed in 5% intervals of the R-R cycle. Systolic and diastolic phases were analyzed on a dedicated work station using MPR, MIP and VRT modes. The patient received 95 cc of contrast.   FINDINGS: Aortic Valve: Severely thickened aortic valve with calcification and reduced  excursion the planimeter valve area is 1.31 Sq cm consistent with at least moderate aortic stenosis   Morphology: Bicuspid valve with a right-left calcified raphe (Siever's 1 Bicuspid).   Annular calcification: None   Aortic Valve Calcium  score: 1890   Perimembranous septal diameter: 6 mm   Mitral Valve: No calcifications   Aortic Annulus Measurements-25% Phase   Major annulus diameter: 27 mm   Minor annulus diameter: 23 mm   Annular perimeter: 80.5   Annular area: 4.99 cm2   Aortic Measurements- 75% phase   Sinotubular Junction: 33 mm   Ascending Thoracic Aorta: 45 mm moderate dilation   Aortic Arch: 29 mm   Descending Thoracic Aorta: 27 mm   Aortic atherosclerosis.   Sinus of Valsalva Measurements:   Right coronary cusp width: 33 mm   Left coronary cusp width: 34 mm   Non coronary cusp width: 36 mm   Coronary Artery Height above Annulus:   Left Main: 17 mm   Left SoV height: 24 mm   Right Coronary: 18 mm   Right SoV height: 24 mm   Optimum Fluoroscopic Angle for Delivery: LAO 11, CAU 10   Cusp overlay view angle: RAO O, CAU 22   Valves for structural team consideration:   Sapien: 26 mm Sapien Valve   Evolut: Upper limits of a 29 mm Evolut (perimeter- appropriate sinus dimensions)   Scene Saves available for both valves.   Non TAVR Valve Findings:   Coronary Arteries: Normal coronary origin. Study not completed with nitroglycerin .   Coronary Calcium  Score deferred due to prior bypass.   Systemic veins: Normal anatomy   Main Pulmonary artery: Normal caliber   Pulmonary veins: Normal anatomy   Left atrial appendage: Chicken wing morphology; patent.   Interatrial septum: PFO  noted.   Chamber dimensions: Within normal limits   Pericardium: No calcification   Extra Cardiac Findings as per separate reporting.   Notable artifacts: Motion artifact affects the annulus   Image quality: Fair   IMPRESSION: 1. Bicuspid aortic  stenosis. Findings pertinent to TAVR procedure are detailed above.   RECOMMENDATIONS:   The proposed cut-off value of 1,651 AU yielded a 93 % sensitivity and 75 % specificity in grading AS severity in patients with classical low-flow, low-gradient AS. Proposed different cut-off values to define severe AS for men and women as 2,065 AU and 1,274 AU, respectively. The joint European and American recommendations for the assessment of AS consider the aortic valve calcium  score as a continuum - a very high calcium  score suggests severe AS and a low calcium  score suggests severe AS is unlikely.   Florette Hurry, et al. 2017 ESC/EACTS Guidelines for the management of valvular heart disease. Eur Heart J [phone]   Coronary artery calcium  (CAC) score is a strong predictor of incident coronary heart disease (CHD) and provides predictive information beyond traditional risk factors. CAC scoring is reasonable to use in the decision to withhold, postpone, or initiate statin therapy in intermediate-risk or selected borderline-risk asymptomatic adults (age 101-75 years and LDL-C >=70 to <190 mg/dL) who do not have diabetes or established atherosclerotic cardiovascular disease (ASCVD).* In intermediate-risk (10-year ASCVD risk >=7.5% to <20%) adults or selected borderline-risk (10-year ASCVD risk >=5% to <7.5%) adults in whom a CAC score is measured for the purpose of making a treatment decision the following recommendations have been made:   If CAC = 0, it is reasonable to withhold statin therapy and reassess in 5 to 10 years, as long as higher risk conditions are absent (diabetes mellitus, family history of premature CHD in first degree relatives (males <55 years; females <65 years), cigarette smoking, LDL >=190 mg/dL or other independent risk factors).   If CAC is 1 to 99, it is reasonable to initiate statin therapy for patients >=13 years of age.   If CAC is >=100 or  >=75th percentile, it is reasonable to initiate statin therapy at any age.   Cardiology referral should be considered for patients with CAC scores >=400 or >=75th percentile.   *2018 AHA/ACC/AACVPR/AAPA/ABC/ACPM/ADA/AGS/APhA/ASPC/NLA/PCNA Guideline on the Management of Blood Cholesterol: A Report of the American College of Cardiology/American Heart Association Task Force on Clinical Practice Guidelines. J Am Coll Cardiol. 2019;73(24):3168-3209.   Mahesh  Chandrasekhar   Electronically Signed: By: Gloriann Larger M.D. On: 08/24/2023 17:19       Narrative & Impression  CLINICAL DATA:  Aortic valve replacement (TAVR), pre-op eval, aortic stenosis   EXAM: CT ANGIOGRAPHY ABDOMEN AND PELVIS WITH CONTRAST AND WITHOUT CONTRAST; CT ANGIOGRAPHY CHEST   TECHNIQUE: Multidetector CT imaging of the abdomen and pelvis was performed using the standard protocol during bolus administration of intravenous contrast. Multiplanar reconstructed images and MIPs were obtained and reviewed to evaluate the vascular anatomy.   RADIATION DOSE REDUCTION: This exam was performed according to the departmental dose-optimization program which includes automated exposure control, adjustment of the mA and/or kV according to patient size and/or use of iterative reconstruction technique.   CONTRAST:  95mL OMNIPAQUE  IOHEXOL  350 MG/ML SOLN   COMPARISON:  None Available.   FINDINGS: VASCULAR   Aorta: Fusiform dilation of the ascending aorta measuring 4.3 cm in greatest dimension. Descending thoracic aorta is of normal caliber measuring 2.9 cm in diameter proximally and 2.4 cm in diameter distally  at the level of the diaphragmatic hiatus. No intramural hematoma or dissection. Moderate atherosclerotic calcification. Arch vasculature demonstrates normal anatomic configuration without evidence of hemodynamically significant stenosis identified. A 50% stenosis is incidentally noted of the origin of  the left vertebral artery which represents the dominant vertebral artery.   Coronary artery bypass grafting has been performed. Global cardiac size is within normal limits. Extensive calcification of the aortic valve leaflets. No pericardial effusion. Central pulmonary arteries are of normal caliber.   The abdominal aorta is of normal caliber measuring 2.4 cm in its supra celiac segment, 18 mm in its juxtarenal segment, and 15 mm in diameter at the bifurcation. Moderate mixed atherosclerotic plaque. No dissection or intramural hematoma. No periaortic inflammatory change.   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 Distally widely patent without aneurysm or dissection. Incidentally noted accessory left hepatic artery   SMA: Patent without evidence of aneurysm, dissection, vasculitis or significant stenosis.   Renals: 50-60% stenosis of the left renal artery at its origin secondary to calcified atherosclerotic plaque. Right renal artery is widely patent. Normal vascular morphology. No aneurysm or dissection   IMA: Less than 50% stenosis at the origin.  Distally widely patent.   Inflow: Common iliac artery bifurcations bilaterally slightly limited by streak artifact from extensive spinal hardware. Greater than 50% stenosis of the proximal left common iliac artery. Less than 50% stenosis of the proximal right common iliac artery. Minimum diameter the left common iliac artery is 6 mm. Minimum diameter of the right common iliac artery is 8 mm. Internal iliac arteries are diseased but are patent bilaterally. External iliac arteries are widely patent bilaterally. These vessels measure 6 mm in diameter.   Proximal Outflow: There is extensive atherosclerotic calcification involving the common femoral arteries bilaterally with near occlusion of the left common femoral artery with residual luminal diameter of approximately  1-2 mm. Less than 50% stenosis of the right common femoral artery with residual luminal diameter of 5-6 mm.   Veins: Unremarkable   Review of the MIP images confirms the above findings.   NON-VASCULAR   Cardiovascular: See above   Mediastinum visualized thyroid  is unremarkable. No pathologic thoracic adenopathy. Esophagus is unremarkable.   Lungs: Mild emphysema. 4 mm pulmonary nodule within the right upper lobe, axial image # 67/, indeterminate. This is unchanged, however, from prior examination of 10/06/2022 and is safely considered benign given its stability over time. Lungs are otherwise clear. No pneumothorax or pleural effusion.   Hepatobiliary: Moderate hepatic steatosis. No enhancing intrahepatic mass. No intra or extrahepatic biliary ductal dilation. Gallbladder unremarkable.   Pancreas: Unremarkable. No pancreatic ductal dilatation or surrounding inflammatory changes.   Spleen: Normal in size without focal abnormality.   Adrenals/Urinary Tract: Adrenal glands are unremarkable. Kidneys are normal, without renal calculi, focal lesion, or hydronephrosis. Bladder is unremarkable.   Stomach/Bowel: Moderate sigmoid diverticulosis. Stomach, small bowel, and large bowel are otherwise unremarkable. Appendix normal. No evidence of obstruction or focal inflammation. No free intraperitoneal gas or fluid.   Lymphatic: No pathologic adenopathy within the abdomen and pelvis.   Reproductive: Prostate is unremarkable.   Other: No abdominal wall hernia or abnormality. No abdominopelvic ascites.   Musculoskeletal: L3-L5 lumbar fusion with instrumentation has been performed. Osseous structures are otherwise age-appropriate. No acute bone abnormality. No lytic or blastic bone lesion.   IMPRESSION: 1. Fusiform dilation of the ascending aorta measuring 4.3 cm in greatest dimension. Recommend annual imaging followup by  CTA or MRA. This recommendation follows  2010 ACCF/AHA/AATS/ACR/ASA/SCA/SCAI/SIR/STS/SVM Guidelines for the Diagnosis and Management of Patients with Thoracic Aortic Disease. Circulation. 2010; 121: Z610-R604. Aortic aneurysm NOS (ICD10-I71.9) 2. Extensive calcification of the aortic valve leaflets in keeping with given history of aortic stenosis. 3. 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 4. 50-60% stenosis of the left renal artery at its origin secondary to calcified atherosclerotic plaque. 5. Greater than 50% stenosis of the proximal left common iliac artery secondary to calcified atherosclerotic plaque. Minimum luminal diameter 6 mm. 6. Near occlusion of the left common femoral artery secondary to calcified atherosclerotic plaque. Minimum luminal diameter 1-2 mm. 7. Mild emphysema. 8. 4 mm right upper lobe pulmonary nodule, stable since 10/06/2022 and safely considered benign given its stability over time. 9. Moderate hepatic steatosis. 10. Moderate sigmoid diverticulosis.   Aortic Atherosclerosis (ICD10-I70.0) and Emphysema (ICD10-J43.9).     Electronically Signed   By: Worthy Heads M.D.   On: 09/03/2023 02:07   Impression:  This 63 year old gentleman has stage D3 paradoxical low-flow/low gradient severe aortic stenosis with NYHA class II symptoms of exertional shortness of breath.  I have personally reviewed his 2D echocardiogram, cardiac catheterization, and CTA studies.  His echocardiogram shows a severely calcified and thickened aortic valve with restricted leaflet mobility.  The mean gradient is 26 mmHg with a valve area by VTI of 0.49 cm, dimensionless index of 0.16, and low stroke-volume index of 18 consistent with severe low-flow/low gradient aortic stenosis.  His left ventricular ejection fraction is normal.  His cardiac catheterization showed severe two-vessel coronary disease with 2/2 patent bypass grafts.  I agree that aortic  valve replacement is indicated in this patient for relief of his symptoms and to prevent progressive left ventricular dysfunction.  I discussed the alternatives of open surgical aortic valve replacement through a redo sternotomy incision versus TAVR.  He is only 63 years old but has undergone prior coronary bypass surgery with patent grafts and feels like he never completely recovered from his 2 recent spine surgeries and wrist surgery.  I discussed the unknown long-term durability of transcatheter aortic valves. He is not interested in undergoing open surgical aortic valve replacement.  His gated cardiac CTA shows anatomy suitable for TAVR using a 26 mm SAPIEN 3 valve.  His abdominal and pelvic CTA shows extensive atherosclerotic calcification of the common femoral arteries bilaterally with near occlusion of the left common femoral artery although he may have transfemoral access above this.  His left subclavian artery appears widely patent with some calcification in the proximal vessel but no significant stenosis.  I think his left subclavian artery would be suitable as an alternative access site.  The patient and his wife were counseled at length regarding treatment alternatives for management of severe symptomatic aortic stenosis. The risks and benefits of surgical intervention has been discussed in detail. Long-term prognosis with medical therapy was discussed. Alternative approaches such as conventional surgical aortic valve replacement, transcatheter aortic valve replacement, and palliative medical therapy were compared and contrasted at length. This discussion was placed in the context of the patient's own specific clinical presentation and past medical history. All of their questions have been addressed.   Following the decision to proceed with transcatheter aortic valve replacement, a discussion was held regarding what types of management strategies would be attempted intraoperatively in the event of  life-threatening complications, including whether or not the patient would be considered  a candidate for the use of cardiopulmonary bypass and/or conversion to open sternotomy for attempted surgical intervention.  I think he would be a reasonable candidate for emergent sternotomy to manage any intraoperative complications although being a redo he would have significantly higher operative risk.  He understands this.  The patient is aware of the fact that transient use of cardiopulmonary bypass may be necessary. The patient has been advised of a variety of complications that might develop including but not limited to risks of death, stroke, paravalvular leak, aortic dissection or other major vascular complications, aortic annulus rupture, device embolization, cardiac rupture or perforation, mitral regurgitation, acute myocardial infarction, arrhythmia, heart block or bradycardia requiring permanent pacemaker placement, congestive heart failure, respiratory failure, renal failure, pneumonia, infection, other late complications related to structural valve deterioration or migration, or other complications that might ultimately cause a temporary or permanent loss of functional independence or other long term morbidity. The patient provides full informed consent for the procedure as described and all questions were answered.      Plan:  He will be discussed at our multidisciplinary heart valve meeting tomorrow before making a final decision about proceeding with TAVR and the appropriate access for insertion.  I spent 60 minutes performing this consultation and > 50% of this time was spent face to face counseling and coordinating the care of this patient's severe symptomatic aortic stenosis.   Bartley Lightning, MD 09/07/2023

## 2023-09-11 ENCOUNTER — Other Ambulatory Visit: Payer: Self-pay | Admitting: *Deleted

## 2023-09-11 DIAGNOSIS — Z122 Encounter for screening for malignant neoplasm of respiratory organs: Secondary | ICD-10-CM

## 2023-09-11 DIAGNOSIS — Z87891 Personal history of nicotine dependence: Secondary | ICD-10-CM

## 2023-09-15 ENCOUNTER — Encounter: Payer: Self-pay | Admitting: Cardiology

## 2023-09-16 ENCOUNTER — Encounter: Admitting: Surgery

## 2023-09-17 ENCOUNTER — Telehealth: Payer: Self-pay

## 2023-09-17 NOTE — Telephone Encounter (Signed)
 Received message from the patient that he would like TAVR to be rescheduled to June.  Called the patient back and left message that Barbra Ley will call him when she returns to discuss a new surgery date.

## 2023-09-21 NOTE — Telephone Encounter (Signed)
 Discussed date of surgery with patient and he would like to have TAVR on 6/3. PAT appointment arranged and I will contact the patient closer to time with surgery instructions.

## 2023-10-02 ENCOUNTER — Other Ambulatory Visit (HOSPITAL_COMMUNITY)

## 2023-10-09 ENCOUNTER — Other Ambulatory Visit: Payer: Self-pay

## 2023-10-09 DIAGNOSIS — I350 Nonrheumatic aortic (valve) stenosis: Secondary | ICD-10-CM

## 2023-10-09 MED ORDER — PREDNISONE 50 MG PO TABS: ORAL_TABLET | ORAL | 0 refills | Status: DC

## 2023-10-13 ENCOUNTER — Other Ambulatory Visit: Payer: Self-pay

## 2023-10-13 ENCOUNTER — Telehealth: Payer: Self-pay

## 2023-10-13 DIAGNOSIS — I350 Nonrheumatic aortic (valve) stenosis: Secondary | ICD-10-CM

## 2023-10-13 NOTE — Telephone Encounter (Signed)
 The pt called into the office after reviewing pre TAVR surgery instructions.  The pt has Iohexol  causing rash listed as an allergy in his chart.  This was entered in his chart 10/16/2020.  The pt states that he has never had a reaction to contrast and he will not take prednisone  due to causing increased heart rate.  I reviewed the pt's chart and cannot find any notes around May 2022 in regards to reaction. The pt has asked that Iohexol  be removed from his chart. Allergy list updated and I will remove orders for contrast pre treatment prior to TAVR.  The pt will not take prednisone  the morning of TAVR.

## 2023-10-16 ENCOUNTER — Ambulatory Visit (HOSPITAL_COMMUNITY)
Admission: RE | Admit: 2023-10-16 | Discharge: 2023-10-16 | Disposition: A | Discharge status: Home / Self Care | Source: Ambulatory Visit | Attending: Cardiovascular Disease | Admitting: Cardiovascular Disease

## 2023-10-16 ENCOUNTER — Encounter (HOSPITAL_COMMUNITY)
Admission: RE | Admit: 2023-10-16 | Discharge: 2023-10-16 | Disposition: A | Discharge status: Home / Self Care | Source: Ambulatory Visit | Attending: Cardiovascular Disease | Admitting: Cardiovascular Disease

## 2023-10-16 ENCOUNTER — Other Ambulatory Visit: Payer: Self-pay

## 2023-10-16 DIAGNOSIS — R9431 Abnormal electrocardiogram [ECG] [EKG]: Secondary | ICD-10-CM | POA: Insufficient documentation

## 2023-10-16 DIAGNOSIS — Z01818 Encounter for other preprocedural examination: Secondary | ICD-10-CM | POA: Insufficient documentation

## 2023-10-16 DIAGNOSIS — I350 Nonrheumatic aortic (valve) stenosis: Secondary | ICD-10-CM | POA: Insufficient documentation

## 2023-10-16 LAB — URINALYSIS, ROUTINE W REFLEX MICROSCOPIC
Bilirubin Urine: NEGATIVE
Glucose, UA: NEGATIVE mg/dL
Hgb urine dipstick: NEGATIVE
Ketones, ur: 5 mg/dL — AB
Leukocytes,Ua: NEGATIVE
Nitrite: NEGATIVE
Protein, ur: NEGATIVE mg/dL
Specific Gravity, Urine: 1.02 (ref 1.005–1.030)
pH: 5 (ref 5.0–8.0)

## 2023-10-16 LAB — COMPREHENSIVE METABOLIC PANEL WITH GFR
ALT: 29 U/L (ref 0–44)
AST: 33 U/L (ref 15–41)
Albumin: 4.3 g/dL (ref 3.5–5.0)
Alkaline Phosphatase: 89 U/L (ref 38–126)
Anion gap: 10 (ref 5–15)
BUN: 8 mg/dL (ref 8–23)
CO2: 24 mmol/L (ref 22–32)
Calcium: 9.8 mg/dL (ref 8.9–10.3)
Chloride: 106 mmol/L (ref 98–111)
Creatinine, Ser: 0.66 mg/dL (ref 0.61–1.24)
GFR, Estimated: >60 mL/min (ref >60)
Glucose, Bld: 90 mg/dL (ref 70–99)
Potassium: 4.6 mmol/L (ref 3.5–5.1)
Sodium: 140 mmol/L (ref 135–145)
Total Bilirubin: 0.8 mg/dL (ref 0.0–1.2)
Total Protein: 7.5 g/dL (ref 6.5–8.1)

## 2023-10-16 LAB — SURGICAL PCR SCREEN
MRSA, PCR: NEGATIVE
Staphylococcus aureus: POSITIVE — AB

## 2023-10-16 LAB — CBC
HCT: 45.8 % (ref 39.0–52.0)
Hemoglobin: 15.6 g/dL (ref 13.0–17.0)
MCH: 33.3 pg (ref 26.0–34.0)
MCHC: 34.1 g/dL (ref 30.0–36.0)
MCV: 97.9 fL (ref 80.0–100.0)
Platelets: 195 10*3/uL (ref 150–400)
RBC: 4.68 MIL/uL (ref 4.22–5.81)
RDW: 11.9 % (ref 11.5–15.5)
WBC: 4 10*3/uL (ref 4.0–10.5)
nRBC: 0 % (ref 0.0–0.2)

## 2023-10-16 LAB — PROTIME-INR
INR: 1 (ref 0.8–1.2)
Prothrombin Time: 13 s (ref 11.4–15.2)

## 2023-10-16 NOTE — Progress Notes (Signed)
 Patient signed all consents at PAT lab appointment. CHG soap and instructions were given to patient. CHG surgical prep reviewed with patient and all questions answered.  Patients chart send to anesthesia for review. Pt denies any respiratory illness/infection in the last two months.

## 2023-10-19 MED ORDER — MAGNESIUM SULFATE 50 % IJ SOLN
40.0000 meq | INTRAMUSCULAR | Status: DC
  Filled 2023-10-19 (×2): qty 9.85

## 2023-10-19 MED ORDER — HEPARIN 30,000 UNITS/1000 ML (OHS) CELLSAVER SOLUTION
Status: DC
  Filled 2023-10-19 (×2): qty 1000

## 2023-10-19 MED ORDER — CEFAZOLIN SODIUM-DEXTROSE 2-4 GM/100ML-% IV SOLN
2.0000 g | INTRAVENOUS | Status: DC
  Filled 2023-10-19 (×2): qty 100

## 2023-10-19 MED ORDER — DEXMEDETOMIDINE HCL IN NACL 400 MCG/100ML IV SOLN
0.1000 ug/kg/h | INTRAVENOUS | Status: AC
  Administered 2023-10-20: 1 ug/kg/h via INTRAVENOUS
  Administered 2023-10-20: 68 ug via INTRAVENOUS
  Filled 2023-10-19: qty 100

## 2023-10-19 MED ORDER — NOREPINEPHRINE 4 MG/250ML-% IV SOLN
0.0000 ug/min | INTRAVENOUS | Status: AC
  Administered 2023-10-20: 1 ug/min via INTRAVENOUS
  Filled 2023-10-19: qty 250

## 2023-10-19 MED ORDER — POTASSIUM CHLORIDE 2 MEQ/ML IV SOLN
80.0000 meq | INTRAVENOUS | Status: DC
  Filled 2023-10-19 (×2): qty 40

## 2023-10-19 NOTE — H&P (Signed)
 [address], [place] [address]             [phone]     Cardiothoracic Surgery Admission History and Physical   PCP is Philip Knuckles, MD Referring Provider is Philip Batman, MD Primary Cardiologist is Philip Gathers, MD   Reason for consultation: Severe aortic stenosis   HPI:   The patient is a 63 year old gentleman with a history of hypertension, hyperlipidemia, coronary artery disease status post CABG x 2 in December 2021 by Dr. [name], bicuspid aortic valve stenosis, thoracic aortic aneurysm, spinal stenosis and peripheral neuropathy status post back surgery x 2, who was referred for consideration of aortic valve replacement.  His most recent echo on 06/30/2023 showed a severely calcified and thickened aortic valve with restricted leaflet mobility.  The mean gradient was 26 mmHg with a peak gradient of 45.4 mmHg.  Aortic valve area by VTI was 0.49 cm with a dimensionless index of 0.16 and a low stroke-volume index of 18.  Left ventricular ejection fraction was 50 to 55%.  The mean gradient across his aortic valve 1 year ago was unchanged at 26 mmHg although the valve area at that time was measured at 1.06 cm with a dimensionless index of 0.26.   He lives with his wife.  He has had some episodes of shortness of breath with exertion which have been more episodic than progressive.  He is physically active and goes to the gym several days per week.  He denies any exertional fatigue.  Has had no chest pain or pressure.  Has had no dizziness or syncope.  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  He has had a tough time over the past few years undergoing 2 back surgeries, left knee replacement, and left wrist surgery.         Past Medical History:  Diagnosis Date   Allergy     Arthritis     CAD (coronary artery disease)     GERD (gastroesophageal reflux disease)      occ   High cholesterol     Hypertension     Peripheral  neuropathy     Right carotid bruit 06/10/2017   Sciatica     Seasonal allergies     Severe aortic stenosis     Sinus tachycardia     Spinal stenosis                 Past Surgical History:  Procedure Laterality Date   CARDIAC CATHETERIZATION       CARPAL TUNNEL WITH CUBITAL TUNNEL Left 11/10/2013    Procedure: LEFT CARPAL TUNNEL RELEASE;  Surgeon: Philip Baize, MD;  Location: [facility];  Service: Orthopedics;  Laterality: Left;   CARPECTOMY Left 03/13/2023    Procedure: LEFT WRIST PROXIMAL ROW CARPECTOMY WITH PLACEMENT ALLOGRAFT SPACER / THUMB CMC DENERVATION;  Surgeon: Philip Abide, MD;  Location: [facility];  Service: Orthopedics;  Laterality: Left;   COLONOSCOPY   2023   CORONARY ARTERY BYPASS GRAFT N/A 05/01/2020    Procedure: CORONARY ARTERY BYPASS GRAFTING (CABG) X 2 ON CARDIOPULMONARY BYPASS. LIMA TO LAD, SVG TO DIAG;  Surgeon: Philip London, MD;  Location: [facility] OR;  Service: Open Heart Surgery;  Laterality: N/A;   CORONARY/GRAFT ANGIOGRAPHY N/A 07/30/2023    Procedure: CORONARY/GRAFT ANGIOGRAPHY;  Surgeon: Philip Benne, MD;  Location: [facility];  Service: Cardiovascular;  Laterality: N/A;   ENDOVEIN HARVEST OF GREATER SAPHENOUS VEIN Right 05/01/2020    Procedure: ENDOVEIN HARVEST OF GREATER SAPHENOUS VEIN;  Surgeon: Philip London, MD;  Location: [facility] OR;  Service: Open Heart Surgery;  Laterality: Right;   KNEE ARTHROSCOPY   [phone]    left and right   LUMBAR LAMINECTOMY/DECOMPRESSION MICRODISCECTOMY Left 09/29/2017    Procedure: LEFT LUMBAR TWO - LUMBAR THREELAMINOTOMY/MICRODISCECTOMY;  Surgeon: Philip Herder, MD;  Location: [facility] OR;  Service: Neurosurgery;  Laterality: Left;  LEFT LUMBAR 2- LUMBAR 3 LAMINOTOMY/MICRODISCECTOMY   NASAL SINUS SURGERY   05/2015   RIGHT/LEFT HEART CATH AND CORONARY ANGIOGRAPHY N/A 04/10/2020    Procedure: RIGHT/LEFT HEART CATH AND CORONARY ANGIOGRAPHY;  Surgeon: Philip Ally, MD;   Location: [facility];  Service: Cardiovascular;  Laterality: N/A;   TEE WITHOUT CARDIOVERSION N/A 05/01/2020    Procedure: TRANSESOPHAGEAL ECHOCARDIOGRAM (TEE);  Surgeon: Philip Winters, Philip Fryer, MD;  Location: [facility] OR;  Service: Open Heart Surgery;  Laterality: N/A;   TOTAL KNEE ARTHROPLASTY Right 06/24/2016    Procedure: TOTAL KNEE ARTHROPLASTY;  Surgeon: Philip Dotter, MD;  Location: [facility] OR;  Service: Orthopedics;  Laterality: Right;   TOTAL KNEE ARTHROPLASTY Left 12/18/2020    Procedure: LEFT TOTAL KNEE ARTHROPLASTY;  Surgeon: Philip Dotter, MD;  Location: [facility];  Service: Orthopedics;  Laterality: Left;   TRANSFORAMINAL LUMBAR INTERBODY FUSION W/ MIS 2 LEVEL Left 04/15/2022    Procedure: Minimally Invasive Surgery Decompression and Transforaminal Lumbar Interbody Fusion , Left , Lumbar three-four, Lumbar four-five;  Surgeon: Dawley, Colby Daub, DO;  Location: [facility] OR;  Service: Neurosurgery;  Laterality: Left;   TRIGGER FINGER RELEASE Left 11/10/2013    Procedure: LEFT INDEX A-1 PULLEY RELEASE;  Surgeon: Philip Baize, MD;  Location: [facility];  Service: Orthopedics;  Laterality: Left;   ULNAR NERVE TRANSPOSITION Left 11/10/2013    Procedure: ULNAR NERVE TRANSPOSITION;  Surgeon: Philip Baize, MD;  Location: [facility];  Service: Orthopedics;  Laterality: Left;               Family History  Problem Relation Age of Onset   Hypertension Mother     Throat cancer Mother     COPD Mother     Hypertension Father     Cancer Father          Mesothelioma   Colon cancer Neg Hx     Esophageal cancer Neg Hx     Stomach cancer Neg Hx     Rectal cancer Neg Hx     Colon polyps Neg Hx            Social History         Socioeconomic History   Marital status: Married      Spouse name: Philip Winters   Number of children: 0   Years of education: 12   Highest education level: Not on file  Occupational History   Occupation: Self employed       Comment: Nutritional therapist   Occupation: Retired Nutritional therapist  Tobacco Use   Smoking status: Former      Current packs/day: 0.00      Average packs/day: 1 pack/day for 37.0 years (37.0 ttl pk-yrs)      Types: Cigarettes      Start date: 05/05/1979      Quit date: 05/04/2016      Years since quitting: 7.3   Smokeless tobacco: Never  Vaping Use   Vaping status: Former  Substance and Sexual Activity   Alcohol  use: Yes      Alcohol /week: 5.0 standard drinks of alcohol       Types: 5 Glasses of wine per week      Comment: weekends only with dinner   Drug use: No   Sexual activity: Yes  Other Topics Concern   Not on file  Social History Narrative    Lives with wife, Philip Winters    Caffeine use: 1 Coffee daily    Right handed     Social Drivers of Manufacturing engineer Strain: Not on file  Food Insecurity: Not on file  Transportation Needs: Not on file  Physical Activity: Not on file  Stress: Not on file  Social Connections: Not on file  Intimate Partner Violence: Not on file             Prior to Admission medications   Medication Sig Start Date End Date Taking? Authorizing Provider  acetaminophen  (TYLENOL ) 500 MG tablet Take 500-1,000 mg by mouth every 6 (six) hours as needed (pain.).     Yes [provider]  Alirocumab  (PRALUENT ) 75 MG/ML SOAJ Inject 1 mL (75 mg total) into the skin every 14 (fourteen) days. 05/05/23   Yes Hugh Madura, MD  aspirin  EC 81 MG tablet Take 1 tablet (81 mg total) by mouth in the morning. Swallow whole. 04/24/22   Yes Dawley, Troy C, DO  betamethasone  dipropionate (DIPROLENE ) 0.05 % ointment Apply 1 Application topically 2 (two) times daily as needed (skin irritation.).     Yes [provider]  CARTIA  XT 240 MG 24 hr capsule Take 240 mg by mouth daily. 05/09/23   Yes [provider]  diltiazem  (CARDIZEM  CD) 120 MG 24 hr capsule Take 2 capsules (240 mg total) by mouth daily. 08/12/23   Yes Hugh Madura, MD  diphenhydrAMINE   (BENADRYL ) 50 MG tablet Take 1 tablet by mouth with final dose of Prednisone  50mg  07/16/23   Yes Philip Benne, MD  doxycycline  (VIBRAMYCIN ) 100 MG capsule Take 2 tablets by mouth 1 hour prior to dental procedure. 04/14/23   Yes Wes Hamman, MD  fluticasone  (FLONASE ) 50 MCG/ACT nasal spray Place 2 sprays into both nostrils daily as needed for allergies.     Yes [provider]  hydrOXYzine  (ATARAX ) 50 MG tablet Take 50 mg by mouth at bedtime as needed (itching/sleep).     Yes [provider]  ivabradine  (CORLANOR) 7.5 MG TABS tablet Take 1 tablet (7.5 mg total) by mouth as directed. Take 2 hours prior to CT scan to slow HR for better imaging 07/30/23   Yes Ardia Kraft, PA-C  methocarbamol  (ROBAXIN ) 500 MG tablet Take 500 mg by mouth every 8 (eight) hours as needed for muscle spasms.     Yes [provider]  Multiple Vitamin (MULTIVITAMIN WITH MINERALS) TABS tablet Take 1 tablet by mouth daily.     Yes [provider]  olmesartan  (BENICAR ) 5 MG tablet Take 2 tablets (10 mg total) by mouth in the morning. 07/16/23   Yes Hugh Madura, MD  Polyethyl Glycol-Propyl Glycol (LUBRICANT EYE DROPS) 0.4-0.3 % SOLN Place 1-2 drops into both eyes 3 (three) times daily as needed (dry/irritated eyes).     Yes [provider]  predniSONE  (DELTASONE ) 50 MG tablet Take 1 tablet 13 hours prior to CTs, then take 1 tablet 7 hours prior to CTs and take 1 tablet 1 hour prior to CTs. 07/30/23  Yes Ardia Kraft, PA-C  traMADol  (ULTRAM ) 50 MG tablet Take 50 mg by mouth every 6 (six) hours as needed for moderate pain (pain score 4-6).     Yes [provider]            Current Outpatient Medications  Medication Sig Dispense Refill   acetaminophen  (TYLENOL ) 500 MG tablet Take 500-1,000 mg by mouth every 6 (six) hours as needed (pain.).       Alirocumab  (PRALUENT ) 75 MG/ML SOAJ Inject 1 mL (75 mg total) into the skin every 14 (fourteen) days. 2 mL 11    aspirin  EC 81 MG tablet Take 1 tablet (81 mg total) by mouth in the morning. Swallow whole. 30 tablet 12   betamethasone  dipropionate (DIPROLENE ) 0.05 % ointment Apply 1 Application topically 2 (two) times daily as needed (skin irritation.).       CARTIA  XT 240 MG 24 hr capsule Take 240 mg by mouth daily.       diltiazem  (CARDIZEM  CD) 120 MG 24 hr capsule Take 2 capsules (240 mg total) by mouth daily. 180 capsule 3   diphenhydrAMINE  (BENADRYL ) 50 MG tablet Take 1 tablet by mouth with final dose of Prednisone  50mg  1 tablet 0   doxycycline  (VIBRAMYCIN ) 100 MG capsule Take 2 tablets by mouth 1 hour prior to dental procedure. 4 capsule 2   fluticasone  (FLONASE ) 50 MCG/ACT nasal spray Place 2 sprays into both nostrils daily as needed for allergies.       hydrOXYzine  (ATARAX ) 50 MG tablet Take 50 mg by mouth at bedtime as needed (itching/sleep).       ivabradine  (CORLANOR) 7.5 MG TABS tablet Take 1 tablet (7.5 mg total) by mouth as directed. Take 2 hours prior to CT scan to slow HR for better imaging 1 tablet 0   methocarbamol  (ROBAXIN ) 500 MG tablet Take 500 mg by mouth every 8 (eight) hours as needed for muscle spasms.       Multiple Vitamin (MULTIVITAMIN WITH MINERALS) TABS tablet Take 1 tablet by mouth daily.       olmesartan  (BENICAR ) 5 MG tablet Take 2 tablets (10 mg total) by mouth in the morning. 180 tablet 3   Polyethyl Glycol-Propyl Glycol (LUBRICANT EYE DROPS) 0.4-0.3 % SOLN Place 1-2 drops into both eyes 3 (three) times daily as needed (dry/irritated eyes).       predniSONE  (DELTASONE ) 50 MG tablet Take 1 tablet 13 hours prior to CTs, then take 1 tablet 7 hours prior to CTs and take 1 tablet 1 hour prior to CTs. 3 tablet 0   traMADol  (ULTRAM ) 50 MG tablet Take 50 mg by mouth every 6 (six) hours as needed for moderate pain (pain score 4-6).          No current facility-administered medications for this visit.        Allergies       Allergies  Allergen Reactions   Mushroom Extract  Complex (Obsolete) Anaphylaxis   Penicillins Anaphylaxis      Childhood allergy.  Has patient had a PCN reaction causing immediate rash, facial/tongue/throat swelling, SOB or lightheadedness with hypotension: No Has patient had a PCN reaction causing severe rash involving mucus membranes or skin necrosis: No Has patient had a PCN reaction that required hospitalization No Has patient had a PCN reaction occurring within the last 10 years: No If all of the above answers are "NO", then may proceed with C   Chantix [Varenicline] Other (See Comments)      headaches  Crestor [Rosuvastatin] Other (See Comments)      myalgia   Dilaudid  [Hydromorphone ] Nausea And Vomiting   Lipitor [Atorvastatin] Other (See Comments)      myalgia   Losartan Potassium Other (See Comments)      Lethargic    Lovaza [Omega-3-Acid Ethyl Esters (Fish)] Other (See Comments)      myalgia   Oxycodone  Hcl Other (See Comments)      hallucinations   Pravachol [Pravastatin] Other (See Comments)      myalgia   Bystolic [Nebivolol Hcl] Other (See Comments)      Extreme fatigue, not able to focus   Codeine Nausea And Vomiting   Iodine Rash   Iohexol  Rash   Lopressor  [Metoprolol ] Other (See Comments)      Made patient very fatigued, could not focus   Red Dye #40 (Allura Red) Rash   Repatha  [Evolocumab ] Rash and Other (See Comments)      Hip pain and rash            Review of Systems:               General:                      normal appetite, normal energy, no weight gain, no weight loss, no fever             Cardiac:                       no chest pain with exertion, no chest pain at rest, +SOB with moderat exertion, no resting SOB, no PND, no orthopnea, no palpitations, no arrhythmia, no atrial fibrillation, no LE edema, no dizzy spells, no syncope             Respiratory:                 + exertional shortness of breath, no home oxygen, no productive cough, no dry cough, no bronchitis, no wheezing, no  hemoptysis, no asthma, no pain with inspiration or cough, no sleep apnea, no CPAP at night             GI:                               no difficulty swallowing, no reflux, no frequent heartburn, no hiatal hernia, no abdominal pain, no constipation, no diarrhea, no hematochezia, no hematemesis, no melena             GU:                              no dysuria,  no frequency, no urinary tract infection, no hematuria, no enlarged prostate, no kidney stones, no kidney disease             Vascular:                     no pain suggestive of claudication, no pain in feet, no leg cramps, no varicose veins, no DVT, no non-healing foot ulcer             Neuro:                         no stroke, no TIA's, no seizures, no headaches, no temporary blindness one eye,  no slurred speech,  no peripheral neuropathy, no chronic pain, no instability of gait, no memory/cognitive dysfunction             Musculoskeletal:         + arthritis - primarily involving the spine, no joint swelling, no myalgias, no difficulty walking, normal mobility              Skin:                            no rash, no itching, no skin infections, no pressure sores or ulcerations             Psych:                         no anxiety, no depression, no nervousness, no unusual recent stress             Eyes:                           no blurry vision, no floaters, no recent vision changes, + wears glasses              ENT:                            no hearing loss, no loose or painful teeth, no dentures, see dentist regularly             Hematologic:               no easy bruising, no abnormal bleeding, no clotting disorder, no frequent epistaxis             Endocrine:                   no diabetes, does not check CBG's at home                            Physical Exam:               BP 129/81   Pulse 100   Resp 20   Ht 5\' 8"  (1.727 m)   Wt 150 lb (68 kg)   SpO2 97% Comment: RA  BMI 22.81 kg/m              General:                       well-appearing             HEENT:                       Unremarkable, NCAT, PERLA, EOMI             Neck:                           no JVD, no bruits, no adenopathy              Chest:                          clear to auscultation, symmetrical breath sounds, no wheezes, no rhonchi              CV:  RRR, 3/6 systolic murmur RSB, no diastolic murmur             Abdomen:                    soft, non-tender, no masses              Extremities:                 warm, well-perfused, pedal pulses not palpable, no lower extremity edema             Rectal/GU                   Deferred             Neuro:                         Grossly non-focal and symmetrical throughout             Skin:                            Clean and dry, no rashes, no breakdown   Diagnostic Tests:     ECHOCARDIOGRAM REPORT       Patient Name:   Philip Winters Date of Exam: 06/30/2023  Medical Rec #:  [id number]   Height:       68.0 in  Accession #:    [id number]  Weight:       150.4 lb  Date of Birth:  Mar 07, 1961   BSA:          1.810 m  Patient Age:    62 years    BP:           120/75 mmHg  Patient Gender: M           HR:           93 bpm.  Exam Location:  Outpatient   Procedure: 2D Echo, 3D Echo, Cardiac Doppler, Color Doppler and Strain  Analysis   Indications:   Aortic Valve Disorder    History:        Patient has prior history of Echocardiogram examinations,  most                 recent 06/19/2022. CAD, Arrythmias:Tachycardia; Risk                  Factors:Hypertension, Former Smoker and Dyslipidemia.  Bicuspid                 Aortic Valve.    Sonographer:    Gelene Kelly RDCS  Referring Phys: 67 TESSA N CONTE   IMPRESSIONS     1. Abnormal septal motion inferior apical hypokinesis EF less robust  comared to prior TTE 07/10/22. Left ventricular ejection fraction, by  estimation, is 50 to 55%. The left ventricle has low normal function. The  left ventricle has no regional wall   motion abnormalities. There is mild left ventricular hypertrophy. Left  ventricular diastolic parameters are consistent with Grade I diastolic  dysfunction (impaired relaxation). The average left ventricular global  longitudinal strain is -12.7 %. The  global longitudinal strain is abnormal.   2. Right ventricular systolic function is normal. The right ventricular  size is normal.   3. The mitral valve is normal in structure. No evidence of mitral valve  regurgitation. No evidence of mitral stenosis.   4. Not well  seen indicated to be tri leaflet by prior TEE Heavily  calcified. Gradients similar to prior echo 07/10/22 but calcuated AVA and  DVI lower due to lower measured LVOT TVI and LVOT diamter. . The aortic  valve is normal in structure. There is  severe calcifcation of the aortic valve. There is severe thickening of the  aortic valve. Aortic valve regurgitation is not visualized. Moderate to  severe aortic valve stenosis.   5. Aortic dilatation noted. There is mild dilatation of the ascending  aorta, measuring 40 mm.   6. The inferior vena cava is normal in size with greater than 50%  respiratory variability, suggesting right atrial pressure of 3 mmHg.   FINDINGS   Left Ventricle: Abnormal septal motion inferior apical hypokinesis EF  less robust comared to prior TTE 07/10/22. Left ventricular ejection  fraction, by estimation, is 50 to 55%. The left ventricle has low normal  function. The left ventricle has no  regional wall motion abnormalities. The average left ventricular global  longitudinal strain is -12.7 %. The global longitudinal strain is  abnormal. The left ventricular internal cavity size was normal in size.  There is mild left ventricular hypertrophy.  Left ventricular diastolic parameters are consistent with Grade I  diastolic dysfunction (impaired relaxation).   Right Ventricle: The right ventricular size is normal. No increase in  right ventricular wall  thickness. Right ventricular systolic function is  normal.   Left Atrium: Left atrial size was normal in size.   Right Atrium: Right atrial size was normal in size.   Pericardium: There is no evidence of pericardial effusion.   Mitral Valve: The mitral valve is normal in structure. No evidence of  mitral valve regurgitation. No evidence of mitral valve stenosis.   Tricuspid Valve: The tricuspid valve is normal in structure. Tricuspid  valve regurgitation is mild . No evidence of tricuspid stenosis.   Aortic Valve: Not well seen indicated to be tri leaflet by prior TEE  Heavily calcified. Gradients similar to prior echo 07/10/22 but calcuated  AVA and DVI lower due to lower measured LVOT TVI and LVOT diamter. The  aortic valve is normal in structure.  There is severe calcifcation of the aortic valve. There is severe  thickening of the aortic valve. Aortic valve regurgitation is not  visualized. Aortic regurgitation PHT measures 281 msec. Moderate to severe  aortic stenosis is present. Aortic valve mean  gradient measures 26.0 mmHg. Aortic valve peak gradient measures 45.4  mmHg. Aortic valve area, by VTI measures 0.49 cm.   Pulmonic Valve: The pulmonic valve was normal in structure. Pulmonic valve  regurgitation is not visualized. No evidence of pulmonic stenosis.   Aorta: Aortic dilatation noted. There is mild dilatation of the ascending  aorta, measuring 40 mm.   Venous: The inferior vena cava is normal in size with greater than 50%  respiratory variability, suggesting right atrial pressure of 3 mmHg.   IAS/Shunts: No atrial level shunt detected by color flow Doppler.     LEFT VENTRICLE  PLAX 2D  LVIDd:         3.84 cm   Diastology  LVIDs:         2.95 cm   LV e' medial:    6.96 cm/s  LV PW:         1.23 cm   LV E/e' medial:  9.3  LV IVS:        1.12 cm   LV e' lateral:   14.30 cm/s  LVOT diam:     2.00 cm   LV E/e' lateral: 4.5  LV SV:         33  LV SV Index:   18         2D Longitudinal Strain  LVOT Area:     3.14 cm  2D Strain GLS Avg:     -12.7 %                             3D Volume EF:                           3D EF:        49 %                           LV EDV:       154 ml                           LV ESV:       79 ml                           LV SV:        75 ml   RIGHT VENTRICLE  RV Basal diam:  2.45 cm  RV Mid diam:    2.02 cm  RV S prime:     12.30 cm/s  TAPSE (M-mode): 1.2 cm   LEFT ATRIUM             Index        RIGHT ATRIUM           Index  LA diam:        3.50 cm 1.93 cm/m   RA Area:     18.10 cm  LA Vol (A2C):   39.8 ml 21.98 ml/m  RA Volume:   51.70 ml  28.56 ml/m  LA Vol (A4C):   35.7 ml 19.72 ml/m  LA Biplane Vol: 40.6 ml 22.43 ml/m   AORTIC VALVE  AV Area (Vmax):    0.60 cm  AV Area (Vmean):   0.56 cm  AV Area (VTI):     0.49 cm  AV Vmax:           337.00 cm/s  AV Vmean:          234.000 cm/s  AV VTI:            0.668 m  AV Peak Grad:      45.4 mmHg  AV Mean Grad:      26.0 mmHg  LVOT Vmax:         64.00 cm/s  LVOT Vmean:        41.400 cm/s  LVOT VTI:          0.104 m  LVOT/AV VTI ratio: 0.16  AI PHT:            281 msec  AR Vena Contracta: 0.53 cm    AORTA  Ao Root diam: 3.60 cm  Ao Asc diam:  4.00 cm   MITRAL VALVE  MV Area (PHT): 4.36 cm    SHUNTS  MV Decel Time: 174 msec    Systemic VTI:  0.10 m  MV E velocity: 65.00 cm/s  Systemic Diam: 2.00 cm  MV A velocity: 84.90  cm/s  MV E/A ratio:  0.77   Janelle Mediate MD  Electronically signed by Janelle Mediate MD  Signature Date/Time: 06/30/2023/12:21:34 PM        Final      Physicians   Panel Physicians Referring Physician Case Authorizing Physician  Philip Benne, MD (Primary)        Procedures   CORONARY/GRAFT ANGIOGRAPHY    Conclusion       1st Diag lesion is 100% stenosed.   Prox LAD to Mid LAD lesion is 90% stenosed.   Mid Cx lesion is 30% stenosed.   Prox RCA lesion is 90% stenosed.   SVG graft was visualized by  angiography and is normal in caliber.   LIMA graft was visualized by angiography and is normal in caliber.   Severe two vessel CAD s/p 2V CABG with 2/2 patent bypass grafts Severe mid LAD stenosis. Chronic occlusion of the diagonal branch after the aneurysmal segment. Patent LIMA to the mid LAD. Patent vein graft to the Diagonal branch.  Patent dominant Circumflex with mild non-obstructive disease Small non-dominant RCA with chronic severe mid vessel stenosis, unchanged from last cath.    Recommendations: Continue workup for surgical AVR vs TAVR. Medical management of CAD   Indications   Severe aortic stenosis [I35.0 (ICD-10-CM)]  Coronary artery disease involving native coronary artery of native heart without angina pectoris [I25.10 (ICD-10-CM)]    Procedural Details   Technical Details Indication: CAD with prior 2V CABG, now with severe AS.   Procedure: The risks, benefits, complications, treatment options, and expected outcomes were discussed with the patient. The patient and/or family concurred with the proposed plan, giving informed consent. The patient was sedated with Versed  and Fentanyl . The left wrist was prepped and draped in a sterile fashion. 1% lidocaine  was used for local anesthesia. Using the modified Seldinger access technique, a 5 French sheath was placed in the left radial artery. 3 mg Verapamil  was given through the sheath. Weight based IV heparin  was given. Standard diagnostic catheters were used to perform selective coronary angiography. I engaged the native RCA and vein graft to the Diagonal with the JR4 catheter. I engaged the left main with a JL4 catheter. I engaged the LIMA graft with an IMA catheter. I did not cross the aortic valve. All catheter exchanges were performed over an exchange length guidewire.   The sheath was removed from the right radial artery and a hemostasis band was applied at the arteriotomy site on the right wrist.      Estimated blood loss <50  mL.   During this procedure medications were administered to achieve and maintain moderate conscious sedation while the patient's heart rate, blood pressure, and oxygen saturation were continuously monitored and I was present face-to-face 100% of this time. Sharren Decree Rad Tech and [employer] Specialist are independent, trained observers who assisted in the monitoring of the patient's level of consciousness.    Medications (Filter: Administrations occurring from 1008 to 1106 on 07/30/23) midazolam  (VERSED ) injection (mg)  Total dose: 2 mg Date/Time Rate/Dose/Volume Action    07/30/23 1026 1 mg Given    1039 1 mg Given    fentaNYL  (SUBLIMAZE ) injection (mcg)  Total dose: 75 mcg Date/Time Rate/Dose/Volume Action    07/30/23 1026 50 mcg Given    1039 25 mcg Given    lidocaine  (PF) (XYLOCAINE ) 1 % injection (mL)  Total volume: 5 mL Date/Time Rate/Dose/Volume Action    07/30/23 1036 5 mL Given    Radial  Cocktail/Verapamil  only (mL)  Total volume: 10 mL Date/Time Rate/Dose/Volume Action    07/30/23 1036 10 mL Given    heparin  sodium (porcine) injection (Units)  Total dose: 3,500 Units Date/Time Rate/Dose/Volume Action    07/30/23 1040 3,500 Units Given    Heparin  (Porcine) in NaCl 1000-0.9 UT/500ML-% SOLN (mL)  Total volume: 1,000 mL Date/Time Rate/Dose/Volume Action    07/30/23 1040 500 mL Given    1040 500 mL Given    iohexol  (OMNIPAQUE ) 350 MG/ML injection (mL)  Total volume: 70 mL Date/Time Rate/Dose/Volume Action    07/30/23 1055 70 mL Given      Sedation Time   Sedation Time Physician-1: 27 minutes 16 seconds Contrast        Administrations occurring from 1008 to 1106 on 07/30/23:  Medication Name Total Dose  iohexol  (OMNIPAQUE ) 350 MG/ML injection 70 mL    Radiation/Fluoro   Fluoro time: 6.8 (min) DAP: 8453 (mGycm2) Cumulative Air Kerma: 138 (mGy) Complications   Complications documented before study signed (07/30/2023 11:07 AM)    No  complications were associated with this study.  Documented by Alinda Apley, RCIS - 07/30/2023 10:19 AM      Coronary Findings   Diagnostic Dominance: Left Left Anterior Descending  Vessel is large.  Prox LAD to Mid LAD lesion is 90% stenosed.    First Diagonal Branch  Vessel is moderate in size.  1st Diag lesion is 100% stenosed. The lesion is chronically occluded.    Left Circumflex  Vessel is large.  Mid Cx lesion is 30% stenosed.    Right Coronary Artery  Vessel is small.  Prox RCA lesion is 90% stenosed.    Saphenous Graft To 1st Diag  SVG graft was visualized by angiography and is normal in caliber.    LIMA LIMA Graft To Dist LAD  LIMA graft was visualized by angiography and is normal in caliber.    Intervention    No interventions have been documented.    Coronary Diagrams   Diagnostic Dominance: Left  Intervention    Implants    No implant documentation for this case.    Syngo Images    Show images for CARDIAC CATHETERIZATION Images on Long Term Storage    Show images for Kayon, Dozier to Procedure Log   Procedure Log    Hemo Data   Flowsheet Row Most Recent Value  AO Systolic Pressure 128 mmHg  AO Diastolic Pressure 80 mmHg  AO Mean 103 mmHg    ADDENDUM REPORT: 08/29/2023 19:58   EXAM: OVER-READ INTERPRETATION  CT CHEST   The following report is an over-read performed by radiologist Dr. Violet Grew [facility] on 08/29/2023. This over-read does not include interpretation of cardiac or coronary anatomy or pathology. The cardiac TAVR CT interpretation by the cardiologist is attached.   COMPARISON:  CT angio chest abdomen pelvis 08/24/2023, CT chest 10/06/2022   FINDINGS: Visualized portions of the lungs are clear.   No lymphadenopathy.   No acute upper abdominal abnormality.   No acute osseous abnormality.   No acute soft tissue abnormality. Multilevel degenerative changes of the spine. Intact  sternotomy wires.   IMPRESSION: No acute abnormality.     Electronically Signed   By: Morgane  Naveau M.D.   On: 08/29/2023 19:58    Addended by Naveau, Morgane E, MD on 08/29/2023  8:01 PM    Study Result   Narrative & Impression  CLINICAL DATA:  Aortic Valve pathology with assessment for TAVR  EXAM: Cardiac TAVR CT   TECHNIQUE: The patient was scanned on a Siemens Force 192 slice scanner. A 120 kV retrospective scan was triggered in the descending thoracic aorta at 111 HU's. Gantry rotation speed was 270 msecs and collimation was .9 mm. No beta blockade or nitro were given. The 3D data set was reconstructed in 5% intervals of the R-R cycle. Systolic and diastolic phases were analyzed on a dedicated work station using MPR, MIP and VRT modes. The patient received 95 cc of contrast.   FINDINGS: Aortic Valve: Severely thickened aortic valve with calcification and reduced excursion the planimeter valve area is 1.31 Sq cm consistent with at least moderate aortic stenosis   Morphology: Bicuspid valve with a right-left calcified raphe (Siever's 1 Bicuspid).   Annular calcification: None   Aortic Valve Calcium  score: 1890   Perimembranous septal diameter: 6 mm   Mitral Valve: No calcifications   Aortic Annulus Measurements-25% Phase   Major annulus diameter: 27 mm   Minor annulus diameter: 23 mm   Annular perimeter: 80.5   Annular area: 4.99 cm2   Aortic Measurements- 75% phase   Sinotubular Junction: 33 mm   Ascending Thoracic Aorta: 45 mm moderate dilation   Aortic Arch: 29 mm   Descending Thoracic Aorta: 27 mm   Aortic atherosclerosis.   Sinus of Valsalva Measurements:   Right coronary cusp width: 33 mm   Left coronary cusp width: 34 mm   Non coronary cusp width: 36 mm   Coronary Artery Height above Annulus:   Left Main: 17 mm   Left SoV height: 24 mm   Right Coronary: 18 mm   Right SoV height: 24 mm   Optimum Fluoroscopic Angle for  Delivery: LAO 11, CAU 10   Cusp overlay view angle: RAO O, CAU 22   Valves for structural team consideration:   Sapien: 26 mm Sapien Valve   Evolut: Upper limits of a 29 mm Evolut (perimeter- appropriate sinus dimensions)   Scene Saves available for both valves.   Non TAVR Valve Findings:   Coronary Arteries: Normal coronary origin. Study not completed with nitroglycerin .   Coronary Calcium  Score deferred due to prior bypass.   Systemic veins: Normal anatomy   Main Pulmonary artery: Normal caliber   Pulmonary veins: Normal anatomy   Left atrial appendage: Chicken wing morphology; patent.   Interatrial septum: PFO noted.   Chamber dimensions: Within normal limits   Pericardium: No calcification   Extra Cardiac Findings as per separate reporting.   Notable artifacts: Motion artifact affects the annulus   Image quality: Fair   IMPRESSION: 1. Bicuspid aortic stenosis. Findings pertinent to TAVR procedure are detailed above.   RECOMMENDATIONS:   The proposed cut-off value of 1,651 AU yielded a 93 % sensitivity and 75 % specificity in grading AS severity in patients with classical low-flow, low-gradient AS. Proposed different cut-off values to define severe AS for men and women as 2,065 AU and 1,274 AU, respectively. The joint European and American recommendations for the assessment of AS consider the aortic valve calcium  score as a continuum - a very high calcium  score suggests severe AS and a low calcium  score suggests severe AS is unlikely.   Florette Hurry, et al. 2017 ESC/EACTS Guidelines for the management of valvular heart disease. Eur Heart J [phone]   Coronary artery calcium  (CAC) score is a strong predictor of incident coronary heart disease (CHD) and provides predictive information beyond traditional risk factors. CAC scoring is reasonable  to use in the decision to withhold, postpone, or initiate statin therapy in  intermediate-risk or selected borderline-risk asymptomatic adults (age 86-75 years and LDL-C >=70 to <190 mg/dL) who do not have diabetes or established atherosclerotic cardiovascular disease (ASCVD).* In intermediate-risk (10-year ASCVD risk >=7.5% to <20%) adults or selected borderline-risk (10-year ASCVD risk >=5% to <7.5%) adults in whom a CAC score is measured for the purpose of making a treatment decision the following recommendations have been made:   If CAC = 0, it is reasonable to withhold statin therapy and reassess in 5 to 10 years, as long as higher risk conditions are absent (diabetes mellitus, family history of premature CHD in first degree relatives (males <55 years; females <65 years), cigarette smoking, LDL >=190 mg/dL or other independent risk factors).   If CAC is 1 to 99, it is reasonable to initiate statin therapy for patients >=40 years of age.   If CAC is >=100 or >=75th percentile, it is reasonable to initiate statin therapy at any age.   Cardiology referral should be considered for patients with CAC scores >=400 or >=75th percentile.   *2018 AHA/ACC/AACVPR/AAPA/ABC/ACPM/ADA/AGS/APhA/ASPC/NLA/PCNA Guideline on the Management of Blood Cholesterol: A Report of the American College of Cardiology/American Heart Association Task Force on Clinical Practice Guidelines. J Am Coll Cardiol. 2019;73(24):3168-3209.   Mahesh  Chandrasekhar   Electronically Signed: By: Gloriann Larger M.D. On: 08/24/2023 17:19          Narrative & Impression  CLINICAL DATA:  Aortic valve replacement (TAVR), pre-op eval, aortic stenosis   EXAM: CT ANGIOGRAPHY ABDOMEN AND PELVIS WITH CONTRAST AND WITHOUT CONTRAST; CT ANGIOGRAPHY CHEST   TECHNIQUE: Multidetector CT imaging of the abdomen and pelvis was performed using the standard protocol during bolus administration of intravenous contrast. Multiplanar reconstructed images and MIPs were obtained and reviewed to  evaluate the vascular anatomy.   RADIATION DOSE REDUCTION: This exam was performed according to the departmental dose-optimization program which includes automated exposure control, adjustment of the mA and/or kV according to patient size and/or use of iterative reconstruction technique.   CONTRAST:  95mL OMNIPAQUE  IOHEXOL  350 MG/ML SOLN   COMPARISON:  None Available.   FINDINGS: VASCULAR   Aorta: Fusiform dilation of the ascending aorta measuring 4.3 cm in greatest dimension. Descending thoracic aorta is of normal caliber measuring 2.9 cm in diameter proximally and 2.4 cm in diameter distally at the level of the diaphragmatic hiatus. No intramural hematoma or dissection. Moderate atherosclerotic calcification. Arch vasculature demonstrates normal anatomic configuration without evidence of hemodynamically significant stenosis identified. A 50% stenosis is incidentally noted of the origin of the left vertebral artery which represents the dominant vertebral artery.   Coronary artery bypass grafting has been performed. Global cardiac size is within normal limits. Extensive calcification of the aortic valve leaflets. No pericardial effusion. Central pulmonary arteries are of normal caliber.   The abdominal aorta is of normal caliber measuring 2.4 cm in its supra celiac segment, 18 mm in its juxtarenal segment, and 15 mm in diameter at the bifurcation. Moderate mixed atherosclerotic plaque. No dissection or intramural hematoma. No periaortic inflammatory change.   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 Distally widely patent without aneurysm or dissection. Incidentally noted accessory left hepatic artery   SMA: Patent without evidence of aneurysm, dissection, vasculitis or significant stenosis.   Renals: 50-60% stenosis of the left renal artery at its origin secondary to calcified atherosclerotic plaque. Right  renal  artery is widely patent. Normal vascular morphology. No aneurysm or dissection   IMA: Less than 50% stenosis at the origin.  Distally widely patent.   Inflow: Common iliac artery bifurcations bilaterally slightly limited by streak artifact from extensive spinal hardware. Greater than 50% stenosis of the proximal left common iliac artery. Less than 50% stenosis of the proximal right common iliac artery. Minimum diameter the left common iliac artery is 6 mm. Minimum diameter of the right common iliac artery is 8 mm. Internal iliac arteries are diseased but are patent bilaterally. External iliac arteries are widely patent bilaterally. These vessels measure 6 mm in diameter.   Proximal Outflow: There is extensive atherosclerotic calcification involving the common femoral arteries bilaterally with near occlusion of the left common femoral artery with residual luminal diameter of approximately 1-2 mm. Less than 50% stenosis of the right common femoral artery with residual luminal diameter of 5-6 mm.   Veins: Unremarkable   Review of the MIP images confirms the above findings.   NON-VASCULAR   Cardiovascular: See above   Mediastinum visualized thyroid  is unremarkable. No pathologic thoracic adenopathy. Esophagus is unremarkable.   Lungs: Mild emphysema. 4 mm pulmonary nodule within the right upper lobe, axial image # 67/, indeterminate. This is unchanged, however, from prior examination of 10/06/2022 and is safely considered benign given its stability over time. Lungs are otherwise clear. No pneumothorax or pleural effusion.   Hepatobiliary: Moderate hepatic steatosis. No enhancing intrahepatic mass. No intra or extrahepatic biliary ductal dilation. Gallbladder unremarkable.   Pancreas: Unremarkable. No pancreatic ductal dilatation or surrounding inflammatory changes.   Spleen: Normal in size without focal abnormality.   Adrenals/Urinary Tract: Adrenal glands are  unremarkable. Kidneys are normal, without renal calculi, focal lesion, or hydronephrosis. Bladder is unremarkable.   Stomach/Bowel: Moderate sigmoid diverticulosis. Stomach, small bowel, and large bowel are otherwise unremarkable. Appendix normal. No evidence of obstruction or focal inflammation. No free intraperitoneal gas or fluid.   Lymphatic: No pathologic adenopathy within the abdomen and pelvis.   Reproductive: Prostate is unremarkable.   Other: No abdominal wall hernia or abnormality. No abdominopelvic ascites.   Musculoskeletal: L3-L5 lumbar fusion with instrumentation has been performed. Osseous structures are otherwise age-appropriate. No acute bone abnormality. No lytic or blastic bone lesion.   IMPRESSION: 1. Fusiform dilation of the ascending aorta measuring 4.3 cm in greatest dimension. Recommend annual imaging followup by CTA or MRA. This recommendation follows 2010 ACCF/AHA/AATS/ACR/ASA/SCA/SCAI/SIR/STS/SVM Guidelines for the Diagnosis and Management of Patients with Thoracic Aortic Disease. Circulation. 2010; 121: U981-X914. Aortic aneurysm NOS (ICD10-I71.9) 2. Extensive calcification of the aortic valve leaflets in keeping with given history of aortic stenosis. 3. 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 4. 50-60% stenosis of the left renal artery at its origin secondary to calcified atherosclerotic plaque. 5. Greater than 50% stenosis of the proximal left common iliac artery secondary to calcified atherosclerotic plaque. Minimum luminal diameter 6 mm. 6. Near occlusion of the left common femoral artery secondary to calcified atherosclerotic plaque. Minimum luminal diameter 1-2 mm. 7. Mild emphysema. 8. 4 mm right upper lobe pulmonary nodule, stable since 10/06/2022 and safely considered benign given its stability over time. 9. Moderate hepatic steatosis. 10. Moderate sigmoid  diverticulosis.   Aortic Atherosclerosis (ICD10-I70.0) and Emphysema (ICD10-J43.9).     Electronically Signed   By: Worthy Heads M.D.   On: 09/03/2023 02:07    Impression:   This 63 year old gentleman has stage  D3 paradoxical low-flow/low gradient severe aortic stenosis with NYHA class II symptoms of exertional shortness of breath.  I have personally reviewed his 2D echocardiogram, cardiac catheterization, and CTA studies.  His echocardiogram shows a severely calcified and thickened aortic valve with restricted leaflet mobility.  The mean gradient is 26 mmHg with a valve area by VTI of 0.49 cm, dimensionless index of 0.16, and low stroke-volume index of 18 consistent with severe low-flow/low gradient aortic stenosis.  His left ventricular ejection fraction is normal.  His cardiac catheterization showed severe two-vessel coronary disease with 2/2 patent bypass grafts.  I agree that aortic valve replacement is indicated in this patient for relief of his symptoms and to prevent progressive left ventricular dysfunction.  I discussed the alternatives of open surgical aortic valve replacement through a redo sternotomy incision versus TAVR.  He is only 63 years old but has undergone prior coronary bypass surgery with patent grafts and feels like he never completely recovered from his 2 recent spine surgeries and wrist surgery.  I discussed the unknown long-term durability of transcatheter aortic valves. He is not interested in undergoing open surgical aortic valve replacement.  His gated cardiac CTA shows anatomy suitable for TAVR using a 26 mm SAPIEN 3 valve.  His abdominal and pelvic CTA shows extensive atherosclerotic calcification of the common femoral arteries bilaterally with near occlusion of the left common femoral artery although he may have transfemoral access above this.  His left subclavian artery appears widely patent with some calcification in the proximal vessel but no significant stenosis.  I  think his left subclavian artery would be suitable as an alternative access site.   The patient and his wife were counseled at length regarding treatment alternatives for management of severe symptomatic aortic stenosis. The risks and benefits of surgical intervention has been discussed in detail. Long-term prognosis with medical therapy was discussed. Alternative approaches such as conventional surgical aortic valve replacement, transcatheter aortic valve replacement, and palliative medical therapy were compared and contrasted at length. This discussion was placed in the context of the patient's own specific clinical presentation and past medical history. All of their questions have been addressed.    Following the decision to proceed with transcatheter aortic valve replacement, a discussion was held regarding what types of management strategies would be attempted intraoperatively in the event of life-threatening complications, including whether or not the patient would be considered a candidate for the use of cardiopulmonary bypass and/or conversion to open sternotomy for attempted surgical intervention.  I think he would be a reasonable candidate for emergent sternotomy to manage any intraoperative complications although being a redo he would have significantly higher operative risk.  He understands this.  The patient is aware of the fact that transient use of cardiopulmonary bypass may be necessary. The patient has been advised of a variety of complications that might develop including but not limited to risks of death, stroke, paravalvular leak, aortic dissection or other major vascular complications, aortic annulus rupture, device embolization, cardiac rupture or perforation, mitral regurgitation, acute myocardial infarction, arrhythmia, heart block or bradycardia requiring permanent pacemaker placement, congestive heart failure, respiratory failure, renal failure, pneumonia, infection, other late  complications related to structural valve deterioration or migration, or other complications that might ultimately cause a temporary or permanent loss of functional independence or other long term morbidity. The patient provides full informed consent for the procedure as described and all questions were answered.       Plan:   Transfemoral TAVR using  a  26 mm Edwards Sapien 3 valve.   Bartley Lightning, MD

## 2023-10-20 ENCOUNTER — Inpatient Hospital Stay (HOSPITAL_COMMUNITY): Admitting: Anesthesiology

## 2023-10-20 ENCOUNTER — Encounter (HOSPITAL_COMMUNITY)
Admission: RE | Disposition: A | Discharge status: Home / Self Care | Payer: Self-pay | Source: Home / Self Care | Attending: Surgery

## 2023-10-20 ENCOUNTER — Encounter (HOSPITAL_COMMUNITY): Payer: Self-pay | Admitting: Cardiovascular Disease

## 2023-10-20 ENCOUNTER — Inpatient Hospital Stay (HOSPITAL_COMMUNITY)
Admission: RE | Admit: 2023-10-20 | Discharge: 2023-10-26 | DRG: 266 | Disposition: A | Discharge status: Home / Self Care | Attending: Surgery | Admitting: Surgery

## 2023-10-20 ENCOUNTER — Other Ambulatory Visit: Payer: Self-pay

## 2023-10-20 ENCOUNTER — Inpatient Hospital Stay (HOSPITAL_COMMUNITY): Admitting: Physician Assistant

## 2023-10-20 ENCOUNTER — Inpatient Hospital Stay (HOSPITAL_COMMUNITY)

## 2023-10-20 DIAGNOSIS — Y838 Other surgical procedures as the cause of abnormal reaction of the patient, or of later complication, without mention of misadventure at the time of the procedure: Secondary | ICD-10-CM | POA: Diagnosis not present

## 2023-10-20 DIAGNOSIS — R918 Other nonspecific abnormal finding of lung field: Secondary | ICD-10-CM | POA: Diagnosis not present

## 2023-10-20 DIAGNOSIS — Z48812 Encounter for surgical aftercare following surgery on the circulatory system: Secondary | ICD-10-CM | POA: Diagnosis not present

## 2023-10-20 DIAGNOSIS — M48061 Spinal stenosis, lumbar region without neurogenic claudication: Secondary | ICD-10-CM | POA: Diagnosis not present

## 2023-10-20 DIAGNOSIS — Q2381 Bicuspid aortic valve: Secondary | ICD-10-CM

## 2023-10-20 DIAGNOSIS — I70203 Unspecified atherosclerosis of native arteries of extremities, bilateral legs: Secondary | ICD-10-CM | POA: Diagnosis not present

## 2023-10-20 DIAGNOSIS — R7989 Other specified abnormal findings of blood chemistry: Secondary | ICD-10-CM | POA: Diagnosis not present

## 2023-10-20 DIAGNOSIS — Z7982 Long term (current) use of aspirin: Secondary | ICD-10-CM | POA: Diagnosis not present

## 2023-10-20 DIAGNOSIS — I350 Nonrheumatic aortic (valve) stenosis: Secondary | ICD-10-CM

## 2023-10-20 DIAGNOSIS — I10 Essential (primary) hypertension: Secondary | ICD-10-CM

## 2023-10-20 DIAGNOSIS — I3139 Other pericardial effusion (noninflammatory): Secondary | ICD-10-CM | POA: Diagnosis not present

## 2023-10-20 DIAGNOSIS — I309 Acute pericarditis, unspecified: Secondary | ICD-10-CM | POA: Diagnosis not present

## 2023-10-20 DIAGNOSIS — Z952 Presence of prosthetic heart valve: Secondary | ICD-10-CM | POA: Diagnosis not present

## 2023-10-20 DIAGNOSIS — I509 Heart failure, unspecified: Secondary | ICD-10-CM | POA: Diagnosis not present

## 2023-10-20 DIAGNOSIS — Z87891 Personal history of nicotine dependence: Secondary | ICD-10-CM

## 2023-10-20 DIAGNOSIS — Z006 Encounter for examination for normal comparison and control in clinical research program: Secondary | ICD-10-CM

## 2023-10-20 DIAGNOSIS — G609 Hereditary and idiopathic neuropathy, unspecified: Secondary | ICD-10-CM | POA: Diagnosis present

## 2023-10-20 DIAGNOSIS — I312 Hemopericardium, not elsewhere classified: Secondary | ICD-10-CM | POA: Diagnosis not present

## 2023-10-20 DIAGNOSIS — G629 Polyneuropathy, unspecified: Secondary | ICD-10-CM | POA: Diagnosis present

## 2023-10-20 DIAGNOSIS — Z79620 Long term (current) use of immunosuppressive biologic: Secondary | ICD-10-CM | POA: Diagnosis not present

## 2023-10-20 DIAGNOSIS — I700 Atherosclerosis of aorta: Secondary | ICD-10-CM | POA: Diagnosis not present

## 2023-10-20 DIAGNOSIS — E7800 Pure hypercholesterolemia, unspecified: Secondary | ICD-10-CM | POA: Diagnosis not present

## 2023-10-20 DIAGNOSIS — I480 Paroxysmal atrial fibrillation: Secondary | ICD-10-CM | POA: Diagnosis not present

## 2023-10-20 DIAGNOSIS — Z954 Presence of other heart-valve replacement: Secondary | ICD-10-CM | POA: Diagnosis not present

## 2023-10-20 DIAGNOSIS — M1990 Unspecified osteoarthritis, unspecified site: Secondary | ICD-10-CM | POA: Diagnosis present

## 2023-10-20 DIAGNOSIS — R570 Cardiogenic shock: Secondary | ICD-10-CM | POA: Diagnosis not present

## 2023-10-20 DIAGNOSIS — I314 Cardiac tamponade: Secondary | ICD-10-CM | POA: Diagnosis not present

## 2023-10-20 DIAGNOSIS — J90 Pleural effusion, not elsewhere classified: Secondary | ICD-10-CM | POA: Diagnosis not present

## 2023-10-20 DIAGNOSIS — R079 Chest pain, unspecified: Secondary | ICD-10-CM | POA: Diagnosis not present

## 2023-10-20 DIAGNOSIS — I4711 Inappropriate sinus tachycardia, so stated: Secondary | ICD-10-CM | POA: Diagnosis not present

## 2023-10-20 DIAGNOSIS — G5603 Carpal tunnel syndrome, bilateral upper limbs: Secondary | ICD-10-CM | POA: Diagnosis present

## 2023-10-20 DIAGNOSIS — Z789 Other specified health status: Secondary | ICD-10-CM | POA: Diagnosis present

## 2023-10-20 DIAGNOSIS — Z951 Presence of aortocoronary bypass graft: Secondary | ICD-10-CM

## 2023-10-20 DIAGNOSIS — I7121 Aneurysm of the ascending aorta, without rupture: Secondary | ICD-10-CM | POA: Diagnosis present

## 2023-10-20 DIAGNOSIS — Z8679 Personal history of other diseases of the circulatory system: Secondary | ICD-10-CM | POA: Diagnosis not present

## 2023-10-20 DIAGNOSIS — K4090 Unilateral inguinal hernia, without obstruction or gangrene, not specified as recurrent: Secondary | ICD-10-CM | POA: Diagnosis not present

## 2023-10-20 DIAGNOSIS — E785 Hyperlipidemia, unspecified: Secondary | ICD-10-CM | POA: Diagnosis present

## 2023-10-20 DIAGNOSIS — J9811 Atelectasis: Secondary | ICD-10-CM | POA: Diagnosis not present

## 2023-10-20 DIAGNOSIS — I9751 Accidental puncture and laceration of a circulatory system organ or structure during a circulatory system procedure: Secondary | ICD-10-CM | POA: Diagnosis not present

## 2023-10-20 DIAGNOSIS — I739 Peripheral vascular disease, unspecified: Secondary | ICD-10-CM

## 2023-10-20 DIAGNOSIS — I2510 Atherosclerotic heart disease of native coronary artery without angina pectoris: Secondary | ICD-10-CM | POA: Diagnosis not present

## 2023-10-20 DIAGNOSIS — Z96653 Presence of artificial knee joint, bilateral: Secondary | ICD-10-CM | POA: Diagnosis present

## 2023-10-20 HISTORY — PX: INTRAOPERATIVE TRANSTHORACIC ECHOCARDIOGRAM: SHX6523

## 2023-10-20 HISTORY — PX: CORONARY/GRAFT ANGIOGRAPHY: CATH118237

## 2023-10-20 HISTORY — DX: Peripheral vascular disease, unspecified: I73.9

## 2023-10-20 LAB — POCT I-STAT, CHEM 8
BUN: 16 mg/dL (ref 8–23)
BUN: 17 mg/dL (ref 8–23)
Calcium, Ion: 1.21 mmol/L (ref 1.15–1.40)
Calcium, Ion: 1.27 mmol/L (ref 1.15–1.40)
Chloride: 103 mmol/L (ref 98–111)
Chloride: 104 mmol/L (ref 98–111)
Creatinine, Ser: 0.5 mg/dL — ABNORMAL LOW (ref 0.61–1.24)
Creatinine, Ser: 0.7 mg/dL (ref 0.61–1.24)
Glucose, Bld: 144 mg/dL — ABNORMAL HIGH (ref 70–99)
Glucose, Bld: 173 mg/dL — ABNORMAL HIGH (ref 70–99)
HCT: 36 % — ABNORMAL LOW (ref 39.0–52.0)
HCT: 37 % — ABNORMAL LOW (ref 39.0–52.0)
Hemoglobin: 12.2 g/dL — ABNORMAL LOW (ref 13.0–17.0)
Hemoglobin: 12.6 g/dL — ABNORMAL LOW (ref 13.0–17.0)
Potassium: 4.1 mmol/L (ref 3.5–5.1)
Potassium: 4.7 mmol/L (ref 3.5–5.1)
Sodium: 139 mmol/L (ref 135–145)
Sodium: 138 mmol/L (ref 135–145)
TCO2: 22 mmol/L (ref 22–32)
TCO2: 23 mmol/L (ref 22–32)

## 2023-10-20 LAB — ECHOCARDIOGRAM LIMITED
AR max vel: 3.57 cm2
AV Area VTI: 3.95 cm2
AV Area mean vel: 3.34 cm2
AV Mean grad: 2 mmHg
AV Peak grad: 4.2 mmHg
Ao pk vel: 1.03 m/s

## 2023-10-20 LAB — MRSA NEXT GEN BY PCR, NASAL: MRSA by PCR Next Gen: NOT DETECTED

## 2023-10-20 SURGERY — TRANSCATHETER AORTIC VALVE REPLACEMENT, TRANSFEMORAL (CATHLAB)
Anesthesia: Monitor Anesthesia Care | Laterality: Right

## 2023-10-20 MED ORDER — MORPHINE SULFATE (PF) 2 MG/ML IV SOLN
1.0000 mg | Freq: Once | INTRAVENOUS | Status: AC | PRN
  Administered 2023-10-21: 1 mg via INTRAVENOUS
  Filled 2023-10-20: qty 1

## 2023-10-20 MED ORDER — VANCOMYCIN HCL IN DEXTROSE 1-5 GM/200ML-% IV SOLN
1000.0000 mg | Freq: Once | INTRAVENOUS | Status: AC
  Administered 2023-10-21: 1000 mg via INTRAVENOUS
  Filled 2023-10-20: qty 200

## 2023-10-20 MED ORDER — CHLORHEXIDINE GLUCONATE CLOTH 2 % EX PADS
6.0000 | MEDICATED_PAD | Freq: Every day | CUTANEOUS | Status: DC
  Administered 2023-10-20 – 2023-10-26 (×6): 6 via TOPICAL

## 2023-10-20 MED ORDER — ALBUMIN HUMAN 5 % IV SOLN
INTRAVENOUS | Status: AC
  Administered 2023-10-20: 12.5 g via INTRAVENOUS
  Filled 2023-10-20: qty 250

## 2023-10-20 MED ORDER — CHLORHEXIDINE GLUCONATE 4 % EX SOLN: 60.0000 mL | Freq: Once | CUTANEOUS | Status: DC

## 2023-10-20 MED ORDER — NITROGLYCERIN IN D5W 200-5 MCG/ML-% IV SOLN: 0.0000 ug/min | INTRAVENOUS | Status: DC

## 2023-10-20 MED ORDER — SODIUM CHLORIDE 0.9% FLUSH: 3.0000 mL | INTRAVENOUS | Status: DC | PRN

## 2023-10-20 MED ORDER — ALBUMIN HUMAN 5 % IV SOLN
12.5000 g | Freq: Once | INTRAVENOUS | Status: AC
  Administered 2023-10-20: 12.5 g via INTRAVENOUS

## 2023-10-20 MED ORDER — SODIUM CHLORIDE 0.9 % IV BOLUS
250.0000 mL | Freq: Once | INTRAVENOUS | Status: AC
  Administered 2023-10-20: 250 mL via INTRAVENOUS

## 2023-10-20 MED ORDER — ASPIRIN 81 MG PO TBEC
81.0000 mg | DELAYED_RELEASE_TABLET | Freq: Every morning | ORAL | Status: DC
  Administered 2023-10-21 – 2023-10-26 (×6): 81 mg via ORAL
  Filled 2023-10-20 (×7): qty 1

## 2023-10-20 MED ORDER — LACTATED RINGERS IV SOLN
INTRAVENOUS | Status: DC | PRN
Start: 2023-10-20 — End: 2023-10-20

## 2023-10-20 MED ORDER — TRAMADOL HCL 50 MG PO TABS
50.0000 mg | ORAL_TABLET | ORAL | Status: DC | PRN
  Administered 2023-10-20: 50 mg via ORAL
  Administered 2023-10-21 – 2023-10-24 (×5): 100 mg via ORAL
  Filled 2023-10-20: qty 2
  Filled 2023-10-20: qty 1
  Filled 2023-10-20 (×6): qty 2

## 2023-10-20 MED ORDER — ALBUMIN HUMAN 5 % IV SOLN: 12.5000 g | Freq: Once | INTRAVENOUS | Status: AC

## 2023-10-20 MED ORDER — PHENYLEPHRINE 80 MCG/ML (10ML) SYRINGE FOR IV PUSH (FOR BLOOD PRESSURE SUPPORT)
PREFILLED_SYRINGE | INTRAVENOUS | Status: DC | PRN
  Administered 2023-10-20 (×2): 160 ug via INTRAVENOUS

## 2023-10-20 MED ORDER — FENTANYL CITRATE (PF) 100 MCG/2ML IJ SOLN
INTRAMUSCULAR | Status: DC | PRN
  Administered 2023-10-20 (×4): 25 ug via INTRAVENOUS

## 2023-10-20 MED ORDER — METHOCARBAMOL 500 MG PO TABS
500.0000 mg | ORAL_TABLET | Freq: Three times a day (TID) | ORAL | Status: DC | PRN
  Administered 2023-10-20 – 2023-10-21 (×2): 500 mg via ORAL
  Filled 2023-10-20 (×2): qty 1

## 2023-10-20 MED ORDER — HEPARIN SODIUM (PORCINE) 1000 UNIT/ML IJ SOLN
INTRAMUSCULAR | Status: DC | PRN
  Administered 2023-10-20: 10000 [IU] via INTRAVENOUS

## 2023-10-20 MED ORDER — NITROGLYCERIN 0.4 MG SL SUBL
0.4000 mg | SUBLINGUAL_TABLET | SUBLINGUAL | Status: DC | PRN
  Administered 2023-10-21: 0.4 mg via SUBLINGUAL
  Filled 2023-10-20: qty 1

## 2023-10-20 MED ORDER — SODIUM CHLORIDE 0.9 % IV SOLN: 250.0000 mL | INTRAVENOUS | Status: DC | PRN

## 2023-10-20 MED ORDER — ONDANSETRON HCL 4 MG/2ML IJ SOLN
4.0000 mg | Freq: Four times a day (QID) | INTRAMUSCULAR | Status: DC | PRN
  Administered 2023-10-20 – 2023-10-21 (×3): 4 mg via INTRAVENOUS
  Filled 2023-10-20 (×3): qty 2

## 2023-10-20 MED ORDER — IODIXANOL 320 MG/ML IV SOLN
INTRAVENOUS | Status: DC | PRN
  Administered 2023-10-20: 90 mL via INTRA_ARTERIAL

## 2023-10-20 MED ORDER — NOREPINEPHRINE 4 MG/250ML-% IV SOLN: 0.0000 ug/min | INTRAVENOUS | Status: DC

## 2023-10-20 MED ORDER — PROPOFOL 10 MG/ML IV BOLUS
INTRAVENOUS | Status: DC | PRN
  Administered 2023-10-20 (×3): 10 mg via INTRAVENOUS

## 2023-10-20 MED ORDER — CHLORHEXIDINE GLUCONATE 4 % EX SOLN: 30.0000 mL | CUTANEOUS | Status: DC

## 2023-10-20 MED ORDER — MORPHINE SULFATE (PF) 2 MG/ML IV SOLN
1.0000 mg | INTRAVENOUS | Status: DC | PRN
  Administered 2023-10-20: 1 mg via INTRAVENOUS
  Filled 2023-10-20: qty 1

## 2023-10-20 MED ORDER — SODIUM CHLORIDE 0.9% FLUSH
3.0000 mL | Freq: Two times a day (BID) | INTRAVENOUS | Status: DC
  Administered 2023-10-21 – 2023-10-26 (×11): 3 mL via INTRAVENOUS

## 2023-10-20 MED ORDER — VANCOMYCIN HCL IN DEXTROSE 1-5 GM/200ML-% IV SOLN
INTRAVENOUS | Status: AC
  Filled 2023-10-20: qty 200

## 2023-10-20 MED ORDER — SODIUM CHLORIDE 0.9 % IV SOLN: INTRAVENOUS | Status: DC

## 2023-10-20 MED ORDER — MIDAZOLAM HCL 2 MG/2ML IJ SOLN
INTRAMUSCULAR | Status: DC | PRN
  Administered 2023-10-20 (×2): 1 mg via INTRAVENOUS

## 2023-10-20 MED ORDER — LIDOCAINE HCL (PF) 1 % IJ SOLN
INTRAMUSCULAR | Status: DC | PRN
  Administered 2023-10-20 (×2): 10 mL

## 2023-10-20 MED ORDER — VANCOMYCIN HCL 1000 MG IV SOLR
INTRAVENOUS | Status: DC | PRN
  Administered 2023-10-20: 1000 mg via INTRAVENOUS

## 2023-10-20 MED ORDER — ACETAMINOPHEN 325 MG PO TABS
650.0000 mg | ORAL_TABLET | Freq: Four times a day (QID) | ORAL | Status: DC | PRN
  Administered 2023-10-20 – 2023-10-23 (×4): 650 mg via ORAL
  Filled 2023-10-20 (×4): qty 2

## 2023-10-20 MED ORDER — CHLORHEXIDINE GLUCONATE 0.12 % MT SOLN
15.0000 mL | Freq: Once | OROMUCOSAL | Status: AC
  Administered 2023-10-20: 15 mL via OROMUCOSAL
  Filled 2023-10-20: qty 15

## 2023-10-20 MED ORDER — HEPARIN (PORCINE) IN NACL 1000-0.9 UT/500ML-% IV SOLN
INTRAVENOUS | Status: DC | PRN
Start: 2023-10-20 — End: 2023-10-20
  Administered 2023-10-20 (×2): 500 mL

## 2023-10-20 MED ORDER — ACETAMINOPHEN 650 MG RE SUPP: 650.0000 mg | Freq: Four times a day (QID) | RECTAL | Status: DC | PRN

## 2023-10-20 MED ORDER — PROTAMINE SULFATE 10 MG/ML IV SOLN
INTRAVENOUS | Status: DC | PRN
Start: 2023-10-20 — End: 2023-10-20
  Administered 2023-10-20: 100 mg via INTRAVENOUS

## 2023-10-20 MED ORDER — SODIUM CHLORIDE 0.9 % IV SOLN: INTRAVENOUS | Status: AC

## 2023-10-20 MED ORDER — PROPOFOL 500 MG/50ML IV EMUL
INTRAVENOUS | Status: DC | PRN
  Administered 2023-10-20: 40 ug/kg/min via INTRAVENOUS

## 2023-10-20 MED ORDER — ACETAMINOPHEN 500 MG PO TABS
1000.0000 mg | ORAL_TABLET | Freq: Once | ORAL | Status: AC
  Administered 2023-10-20: 1000 mg via ORAL
  Filled 2023-10-20: qty 2

## 2023-10-20 SURGICAL SUPPLY — 39 items
BAG SNAP BAND KOVER 36X36 (MISCELLANEOUS) ×4 IMPLANT
CABLE ADAPT PACING TEMP 12FT (ADAPTER) IMPLANT
CATH 26 ULTRA DELIVERY (CATHETERS) IMPLANT
CATH DIAG 6FR PIGTAIL ANGLED (CATHETERS) IMPLANT
CATH INFINITI 5 FR AL2 (CATHETERS) IMPLANT
CATH INFINITI 5 FR LCB (CATHETERS) IMPLANT
CATH INFINITI 5FR ANG PIGTAIL (CATHETERS) IMPLANT
CATH INFINITI 5FR JL4 (CATHETERS) IMPLANT
CATH INFINITI 6F AL1 (CATHETERS) IMPLANT
CATH INFINITI JR4 5F (CATHETERS) IMPLANT
CATH S G BIP PACING (CATHETERS) IMPLANT
CLOSURE MYNX CONTROL 6F/7F (Vascular Products) IMPLANT
CLOSURE PERCLOSE PROSTYLE (VASCULAR PRODUCTS) IMPLANT
CRIMPER (MISCELLANEOUS) IMPLANT
DEVICE INFLATION ATRION QL2530 (MISCELLANEOUS) IMPLANT
ELECT DEFIB PAD ADLT CADENCE (PAD) IMPLANT
GLIDESHEATH SLEND SS 6F .021 (SHEATH) IMPLANT
KIT MICROPUNCTURE NIT STIFF (SHEATH) IMPLANT
KIT SAPIAN 3 ULTRA RESILIA 26 (Valve) IMPLANT
KIT SINGLE USE MANIFOLD (KITS) IMPLANT
PACK CARDIAC CATHETERIZATION (CUSTOM PROCEDURE TRAY) ×2 IMPLANT
SET ATX-X65L (MISCELLANEOUS) IMPLANT
SHEATH BRITE TIP 7FR 35CM (SHEATH) IMPLANT
SHEATH INTRODUCER SET 20-26 (SHEATH) IMPLANT
SHEATH PINNACLE 6F 10CM (SHEATH) IMPLANT
SHEATH PINNACLE 8F 10CM (SHEATH) IMPLANT
SHEATH PROBE COVER 6X72 (BAG) IMPLANT
SHIELD CATHGARD ARROW (MISCELLANEOUS) IMPLANT
STATION PROTECTION PRESSURIZED (MISCELLANEOUS) IMPLANT
STOPCOCK MORSE 400PSI 3WAY (MISCELLANEOUS) ×4 IMPLANT
TRANSDUCER W/STOPCOCK (MISCELLANEOUS) IMPLANT
TUBING ART PRESS 72 MALE/FEM (TUBING) IMPLANT
WIRE AMPLATZ SS-J .035X180CM (WIRE) IMPLANT
WIRE EMERALD 3MM-J .035X150CM (WIRE) IMPLANT
WIRE EMERALD 3MM-J .035X260CM (WIRE) IMPLANT
WIRE EMERALD ST .035X260CM (WIRE) IMPLANT
WIRE MICROINTRODUCER 60CM (WIRE) IMPLANT
WIRE SAFARI SM CURVE 275 (WIRE) IMPLANT
WIRE TORQFLEX AUST .018X40CM (WIRE) IMPLANT

## 2023-10-20 NOTE — Discharge Instructions (Signed)

## 2023-10-20 NOTE — Discharge Summary (Addendum)
 HEART AND VASCULAR CENTER   MULTIDISCIPLINARY HEART VALVE TEAM  Discharge Summary    Patient ID: Philip Winters MRN: [id number]; DOB: 03-27-1961  Admit date: 10/20/2023 Discharge date: 10/26/2023  PCP:  [name], MD  [facility] Cardiologist:  [name], MD  [facility] Structural heart: [name], MD [facility] Electrophysiologist:  None   Discharge Diagnoses    Principal Problem:   S/P TAVR (transcatheter aortic valve replacement) Active Problems:   Primary hypertension   Bicuspid aortic valve   Statin intolerance   Carpal tunnel syndrome, bilateral upper limbs   Lumbar spinal stenosis   Dyslipidemia, goal LDL below 70   Idiopathic small fiber peripheral neuropathy   PAD (peripheral artery disease) (HCC)   Aortic stenosis   Pericardial effusion   Allergies Allergies  Allergen Reactions   Mushroom Extract Complex (Obsolete) Anaphylaxis   Penicillins Anaphylaxis    Childhood allergy.  Has patient had a PCN reaction causing immediate rash, facial/tongue/throat swelling, SOB or lightheadedness with hypotension: No Has patient had a PCN reaction causing severe rash involving mucus membranes or skin necrosis: No Has patient had a PCN reaction that required hospitalization No Has patient had a PCN reaction occurring within the last 10 years: No If all of the above answers are "NO", then may proceed with Cephalosporin.  (Has taken Keflex  w/o adverse reaction).    Chantix [Varenicline] Other (See Comments)    headaches    Crestor [Rosuvastatin] Other (See Comments)    myalgia   Dilaudid  [Hydromorphone ] Nausea And Vomiting   Lipitor [Atorvastatin] Other (See Comments)    myalgia   Losartan Potassium Other (See Comments)    Lethargic    Lovaza [Omega-3-Acid Ethyl Esters (Fish)] Other (See Comments)    myalgia   Oxycodone  Hcl Other (See Comments)    hallucinations   Pravachol [Pravastatin] Other (See Comments)    myalgia   Bystolic [Nebivolol  Hcl] Other (See Comments)    Extreme fatigue, not able to focus   Codeine Nausea And Vomiting   Lopressor  [Metoprolol ] Other (See Comments)    Made patient very fatigued, could not focus   Red Dye #40 (Allura Red) Rash   Repatha  [Evolocumab ] Rash and Other (See Comments)    Hip pain and rash    Diagnostic Studies/Procedures     TAVR OPERATIVE NOTE     Date of Procedure:                10/20/2023   Preoperative Diagnosis:      Severe Aortic Stenosis    Postoperative Diagnosis:    Same    Procedure:        Transcatheter Aortic Valve Replacement - Percutaneous Right Transfemoral Approach             Edwards Sapien 3 Ultra ResiliaTHV (size 26 mm, model # 9755RSL, serial # [id number])              Co-Surgeons:                        Bartley Lightning, MD and [name], MD     Anesthesiologist:                  Robinson Chough, MD   Echocardiographer:              Sharrie Deed, MD   Pre-operative Echo Findings: Severe aortic stenosis Normal left ventricular systolic function   Post-operative Echo Findings: No paravalvular  leak Normal left ventricular systolic function _____________    Echo 10/21/23:    1. Left ventricular ejection fraction, by estimation, is 50 to 55%. The  left ventricle has low normal function.   2. Moderate pericardial effusion. The pericardial effusion is posterior  and lateral to the left ventricle.   3. The inferior vena cava is normal in size with <50% respiratory  variability, suggesting right atrial pressure of 8 mmHg.    History of Present Illness     Philip Winters is a 63 y.o. male with a history of CAD s/p 2V CABG with SVG--> D1 & LIMA--> LAD in (2021 PVT), PAD by CTs, HTN, HLD, bilateral carpal tunnel, spinal stenosis, peripheral neuropathy, inappropriate sinus tachycardia, TAA, bicuspid aortic valve with pLFLG aortic stenosis  who presented to [facility] on 10/20/23 for planned TAVR.   He developed progressive exertional shortness of breath. Echo  06/30/2023 showed EF 50% and severe LFLG AS with mean grad 26 mmHg, AVA 0.6 cm2, DVI 0.16, SVI 18. L/RHC 07/30/23 showed severe two-vessel coronary disease with 2/2 patent bypass grafts, 30% mLCx and 90% pRCA lesion (vessel noted to be small). TAVR CTs showed extensive atherosclerotic calcification involving the common femoral arteries bilaterally with near occlusion of the left common femoral artery with residual luminal diameter of approximately 1-2 mm. He was not felt to be a good candidate for redo sternotomy given multiple orthopedic surgeries and slow recoveries.  The patient was evaluated by the multidisciplinary valve team and felt to have severe, symptomatic aortic stenosis and to be a suitable candidate for TAVR, which was set up for 10/20/23.   Hospital Course     Consultants: none   Severe AS:  -- S/p successful TAVR with a 26 mm Edwards Sapien 3 Ultra Resilia THV via the TF approach on 10/20/23.  -- Post operative echo showed stable valve function with new moderate pericardial effusion.  -- Groin sites are stable.  -- Continue Asprin 81mg  daily. -- Met with cardiac rehab to discuss CRP phase II.  -- Plan for discharge home today with close follow up in the outpatient setting.   Pericardial effusion:  -- Post-TAVR noted to have moderate pericardial effusion primarily posterior to heart with some evidence for compression of left atrium from CTA chest 6/4.    -- No good window for pericardiocentesis, underwent robot-assisted VATS/pericardial window with drain placement 6/5 with Dr. Luna Salinas.  -- Continue colchicine  0.6 mg daily for pleuritic chest pain.  PAF:  -- Suspect at least partly due to drain placement, converted on IV amiodarone .  -- Treated with IV amio, plan to stop before discharge per Dr. Alease Amend. -- No OAC given post op relation and recent bleed.  CAD: -- CAD s/p 2V CABG with SVG--> D1 & LIMA--> LAD in (2021 PVT). -- [facility] 07/30/23 showed severe two-vessel coronary  disease with 2/2 patent bypass grafts, 30% mLCx and 90% pRCA lesion (vessel noted to be small).  -- Continue aspirin  81mg  daily. -- Statin intolerant.  -- Continue Praulent 75mg  q 14 days.  HTN:  -- BP overall with good control. -- Resume home olmesartan  10mg  daily, Cardizem  XT 240mg  daily.  PAD: -- Noted on pre TAVR CTs. -- Continue medical therapy.   TAA: -- 4.3cm.  -- Continue annal imaging.   Sinus tachycardia: -- He has a long history of sinus tachycardia with HRs in low 100s.  -- He has not tolerated beta blockers in the past.  -- Resume home Cardizem   Increased risk for amyloid heart disease: -- With bilateral carpal tunnel, spinal stenosis, peripheral neuropathy and severe LFLG AS.  -- Will discuss amyloid work up in the outpatient setting.  ___________  Discharge Vitals Blood pressure 125/86, pulse 99, temperature 98.3 F (36.8 C), temperature source Oral, resp. rate 19, height 5\' 8"  (1.727 m), weight 68.8 kg, SpO2 94%.  Filed Weights   10/24/23 0500 10/25/23 0500 10/26/23 0617  Weight: 70.5 kg 70.7 kg 68.8 kg    Disposition   Pt is being discharged home today in good condition.  Follow-up Plans & Appointments     Follow-up Information     Ardia Kraft, PA-C. Go on 10/29/2023.   Specialties: Cardiology, Radiology Why: @ 1:05pm, please arrive at least 15 minutes early. Contact information: [address] [place] [address] [phone]         Zelphia Higashi, MD. Go on 11/11/2023.   Specialty: Cardiothoracic Surgery Why: Appointment time is at 4:00 pm Contact information: [address] [place] [address] [phone]         [facility] [place] [employer]. Go on 11/11/2023.   Specialty: Radiology Why: Please arrive by 3:00 pm in order to have a PA/LAT CXR taken prior to office appointment with Dr. Lindon Rhine information: [address] [place]   [phone]               Discharge Instructions     Amb Referral to Cardiac Rehabilitation   Complete by: As directed    Diagnosis: Valve Replacement   Valve: Aortic   After initial evaluation and assessments completed: Virtual Based Care may be provided alone or in conjunction with Phase 2 Cardiac Rehab based on patient barriers.: Yes   Intensive Cardiac Rehabilitation (ICR) [facility] location only OR Traditional Cardiac Rehabilitation (TCR) *If criteria for ICR are not met will enroll in TCR [facility] only): Yes       Discharge Medications   Allergies as of 10/26/2023       Reactions   Mushroom Extract Complex (obsolete) Anaphylaxis   Penicillins Anaphylaxis   Childhood allergy.  Has patient had a PCN reaction causing immediate rash, facial/tongue/throat swelling, SOB or lightheadedness with hypotension: No Has patient had a PCN reaction causing severe rash involving mucus membranes or skin necrosis: No Has patient had a PCN reaction that required hospitalization No Has patient had a PCN reaction occurring within the last 10 years: No If all of the above answers are "NO", then may proceed with Cephalosporin.  (Has taken Keflex  w/o adverse reaction).    Chantix [varenicline] Other (See Comments)   headaches   Crestor [rosuvastatin] Other (See Comments)   myalgia   Dilaudid  [hydromorphone ] Nausea And Vomiting   Lipitor [atorvastatin] Other (See Comments)   myalgia   Losartan Potassium Other (See Comments)   Lethargic    Lovaza [omega-3-acid Ethyl Esters (fish)] Other (See Comments)   myalgia   Oxycodone  Hcl Other (See Comments)   hallucinations   Pravachol [pravastatin] Other (See Comments)   myalgia   Bystolic [nebivolol Hcl] Other (See Comments)   Extreme fatigue, not able to focus   Codeine Nausea And Vomiting   Lopressor  [metoprolol ] Other (See Comments)   Made patient very fatigued, could not focus   Red Dye #40 (allura Red) Rash   Repatha  [evolocumab ] Rash, Other (See  Comments)   Hip pain and rash        Medication List  TAKE these medications    acetaminophen  500 MG tablet Commonly known as: TYLENOL  Take 500-1,000 mg by mouth every 6 (six) hours as needed for moderate pain (pain score 4-6).   aspirin  EC 81 MG tablet Take 1 tablet (81 mg total) by mouth in the morning. Swallow whole.   Cartia  XT 240 MG 24 hr capsule Generic drug: diltiazem  Take 240 mg by mouth daily.   diltiazem  120 MG 24 hr capsule Commonly known as: Cardizem  CD Take 2 capsules (240 mg total) by mouth daily.   colchicine  0.6 MG tablet Take 1 tablet (0.6 mg total) by mouth daily. Start taking on: October 27, 2023   doxycycline  100 MG capsule Commonly known as: VIBRAMYCIN  Take 2 tablets by mouth 1 hour prior to dental procedure.   fluticasone  50 MCG/ACT nasal spray Commonly known as: FLONASE  Place 2 sprays into both nostrils daily as needed for allergies.   hydrOXYzine  50 MG tablet Commonly known as: ATARAX  Take 50 mg by mouth at bedtime as needed (itching/sleep).   Lubricant Eye Drops 0.4-0.3 % Soln Generic drug: Polyethyl Glycol-Propyl Glycol Place 1-2 drops into both eyes 3 (three) times daily as needed (dry/irritated eyes).   methocarbamol  500 MG tablet Commonly known as: ROBAXIN  Take 500 mg by mouth every 8 (eight) hours as needed for muscle spasms.   multivitamin with minerals Tabs tablet Take 1 tablet by mouth daily.   olmesartan  5 MG tablet Commonly known as: BENICAR  Take 2 tablets (10 mg total) by mouth in the morning.   Praluent  75 MG/ML Soaj Generic drug: Alirocumab  Inject 1 mL (75 mg total) into the skin every 14 (fourteen) days.   traMADol  50 MG tablet Commonly known as: ULTRAM  Take 50 mg by mouth every 6 (six) hours as needed for moderate pain (pain score 4-6).          Outstanding Labs/Studies  none ______________________  Duration of Discharge Encounter: APP Time: 25  minutes MD Time: 30 minutes    Signed, Abagail Hoar, PA-C 10/26/2023, 11:15 AM

## 2023-10-20 NOTE — Op Note (Signed)
 HEART AND VASCULAR CENTER   MULTIDISCIPLINARY HEART VALVE TEAM   TAVR OPERATIVE NOTE   Date of Procedure:  10/20/2023  Preoperative Diagnosis: Severe Aortic Stenosis   Postoperative Diagnosis: Same   Procedure:   Transcatheter Aortic Valve Replacement - Percutaneous Right Transfemoral Approach  Edwards Sapien 3 Ultra ResiliaTHV (size 26 mm, model # 9755RSL, serial # [id number])   Co-Surgeons:  Bartley Lightning, MD and [name], MD   Anesthesiologist:  J. Elby Green, MD  Echocardiographer:  P. Stann Earnest, MD  Pre-operative Echo Findings: Severe aortic stenosis Normal left ventricular systolic function  Post-operative Echo Findings: No paravalvular leak Normal left ventricular systolic function   BRIEF CLINICAL NOTE AND INDICATIONS FOR SURGERY  This 63 year old gentleman has stage D3 paradoxical low-flow/low gradient severe aortic stenosis with NYHA class II symptoms of exertional shortness of breath.  I have personally reviewed his 2D echocardiogram, cardiac catheterization, and CTA studies.  His echocardiogram shows a severely calcified and thickened aortic valve with restricted leaflet mobility.  The mean gradient is 26 mmHg with a valve area by VTI of 0.49 cm, dimensionless index of 0.16, and low stroke-volume index of 18 consistent with severe low-flow/low gradient aortic stenosis.  His left ventricular ejection fraction is normal.  His cardiac catheterization showed severe two-vessel coronary disease with 2/2 patent bypass grafts.  I agree that aortic valve replacement is indicated in this patient for relief of his symptoms and to prevent progressive left ventricular dysfunction.  I discussed the alternatives of open surgical aortic valve replacement through a redo sternotomy incision versus TAVR.  He is only 63 years old but has undergone prior coronary bypass surgery with patent grafts and feels like he never completely recovered from his 2 recent spine surgeries and wrist  surgery.  I discussed the unknown long-term durability of transcatheter aortic valves. He is not interested in undergoing open surgical aortic valve replacement.  His gated cardiac CTA shows anatomy suitable for TAVR using a 26 mm SAPIEN 3 valve.  His abdominal and pelvic CTA shows extensive atherosclerotic calcification of the common femoral arteries bilaterally with near occlusion of the left common femoral artery although he may have transfemoral access above this.  His left subclavian artery appears widely patent with some calcification in the proximal vessel but no significant stenosis.  I think his left subclavian artery would be suitable as an alternative access site.   The patient and his wife were counseled at length regarding treatment alternatives for management of severe symptomatic aortic stenosis. The risks and benefits of surgical intervention has been discussed in detail. Long-term prognosis with medical therapy was discussed. Alternative approaches such as conventional surgical aortic valve replacement, transcatheter aortic valve replacement, and palliative medical therapy were compared and contrasted at length. This discussion was placed in the context of the patient's own specific clinical presentation and past medical history. All of their questions have been addressed.    Following the decision to proceed with transcatheter aortic valve replacement, a discussion was held regarding what types of management strategies would be attempted intraoperatively in the event of life-threatening complications, including whether or not the patient would be considered a candidate for the use of cardiopulmonary bypass and/or conversion to open sternotomy for attempted surgical intervention.  I think he would be a reasonable candidate for emergent sternotomy to manage any intraoperative complications although being a redo he would have significantly higher operative risk.  He understands this.  The patient  is aware of the fact that transient use of  cardiopulmonary bypass may be necessary. The patient has been advised of a variety of complications that might develop including but not limited to risks of death, stroke, paravalvular leak, aortic dissection or other major vascular complications, aortic annulus rupture, device embolization, cardiac rupture or perforation, mitral regurgitation, acute myocardial infarction, arrhythmia, heart block or bradycardia requiring permanent pacemaker placement, congestive heart failure, respiratory failure, renal failure, pneumonia, infection, other late complications related to structural valve deterioration or migration, or other complications that might ultimately cause a temporary or permanent loss of functional independence or other long term morbidity. The patient provides full informed consent for the procedure as described and all questions were answered.        DETAILS OF THE OPERATIVE PROCEDURE  PREPARATION:    The patient was brought to the operating room on the above mentioned date and appropriate monitoring was established by the anesthesia team. The patient was placed in the supine position on the operating table.  Intravenous antibiotics were administered. The patient was monitored closely throughout the procedure under conscious sedation.    Baseline transthoracic echocardiogram was performed. The patient's abdomen and both groins were prepped and draped in a sterile manner. A time out procedure was performed.   PERIPHERAL ACCESS:    Using the modified Seldinger technique, femoral arterial and venous access was obtained with placement of 6 Fr sheaths on the left side.  A pigtail diagnostic catheter was passed through the left arterial sheath under fluoroscopic guidance into the aortic root.  A temporary transvenous pacemaker catheter was passed through the left femoral venous sheath under fluoroscopic guidance into the right ventricle.  The pacemaker  was tested to ensure stable lead placement and pacemaker capture. Aortic root angiography was performed in order to determine the optimal angiographic angle for valve deployment.   TRANSFEMORAL ACCESS:   Percutaneous transfemoral access and sheath placement was performed using ultrasound guidance.  The right common femoral artery was cannulated using a micropuncture needle and appropriate location was verified using hand injection angiogram.  A pair of Abbott Perclose percutaneous closure devices were placed and a 6 French sheath replaced into the femoral artery.  The patient was heparinized systemically and ACT verified > 250 seconds.    A 14 Fr transfemoral E-sheath was introduced into the right common femoral artery after progressively dilating over an Amplatz superstiff wire. An AL-1 catheter was used to direct a straight-tip exchange length wire across the native aortic valve into the left ventricle. This was exchanged out for a pigtail catheter and position was confirmed in the LV apex. Simultaneous LV and Ao pressures were recorded.  The pigtail catheter was exchanged for a Safari wire in the LV apex.   BALLOON AORTIC VALVULOPLASTY:   Not performed   TRANSCATHETER HEART VALVE DEPLOYMENT:   An Edwards Sapien 3 Ultra Resilia transcatheter heart valve (size 26 mm) was prepared and crimped per manufacturer's guidelines, and the proper orientation of the valve is confirmed on the [employer] delivery system. The valve was advanced through the introducer sheath using normal technique until in an appropriate position in the abdominal aorta beyond the sheath tip. The balloon was then retracted and using the fine-tuning wheel was centered on the valve. The valve was then advanced across the aortic arch using appropriate flexion of the catheter. The valve was carefully positioned across the aortic valve annulus. The Commander catheter was retracted using normal technique. Once final position of  the valve has been confirmed by angiographic assessment, the valve is  deployed during rapid ventricular pacing to maintain systolic blood pressure < 50 mmHg and pulse pressure < 10 mmHg. The balloon inflation is held for >3 seconds after reaching full deployment volume. Once the balloon has fully deflated the balloon is retracted into the ascending aorta and valve function is assessed using echocardiography. There is felt to be no paravalvular leak and no central aortic insufficiency.  The patient's hemodynamic recovery following valve deployment is good.  The deployment balloon and guidewire are both removed.    PROCEDURE COMPLETION:   The sheath was removed and femoral artery closure performed.  Protamine  was administered once femoral arterial repair was complete. The temporary pacemaker, pigtail catheter and femoral sheaths were removed with manual pressure used for venous hemostasis.  A Mynx femoral closure device was utilized following removal of the diagnostic sheath in the left femoral artery.  The patient tolerated the procedure well and is transported to the cath lab recovery area in stable condition. There were no immediate intraoperative complications. All sponge instrument and needle counts are verified correct at completion of the operation.   No blood products were administered during the operation.  The patient received a total of 90 mL of intravenous contrast during the procedure.   Bartley Lightning, MD 10/20/2023

## 2023-10-20 NOTE — Progress Notes (Signed)
 pt from cath lab initially hypotensive upon arrival. CRNA treated with phenylephrine  then passed care onto this RN. Soon after BP trended down. Remaining NS bag ~256ml given. Dr. Elby Green called with verbal orders for albumin  and to restart NE gtt. Pt c/o CP upon arrival, but  unchanged from pain experienced in procedure. RN's assessment found pain to be reproducible. Post-op EKG unchanged from pre-op EKG.  NE gtt titrated to 10mcg. Second albumin  started along with 250ml NS bolus. Dr. Abel Hoe updated and made aware of pt's condition. Upgraded to ICU status.  Left venous sheath remained in place. Sheath ready to be removed immediately post-op but d/t pt's condition was not d/c'd. To be pulled in ICU once stable.

## 2023-10-20 NOTE — Anesthesia Procedure Notes (Signed)
 Procedure Name: MAC Date/Time: 10/20/2023 4:57 PM  Performed by: Raymund Calix, CRNAPre-anesthesia Checklist: Patient identified, Emergency Drugs available, Suction available and Patient being monitored Patient Re-evaluated:Patient Re-evaluated prior to induction Oxygen Delivery Method: Simple face mask Preoxygenation: Pre-oxygenation with 100% oxygen

## 2023-10-20 NOTE — Transfer of Care (Signed)
 Immediate Anesthesia Transfer of Care Note  Patient: Philip Winters  Procedure(s) Performed: Transcatheter Aortic Valve Replacement, Transfemoral (Right) ECHOCARDIOGRAM, TRANSTHORACIC CORONARY ANGIOGRAPHY  Patient Location: Cath Lab  Anesthesia Type:General  Level of Consciousness: awake, alert , and oriented  Airway & Oxygen Therapy: Patient Spontanous Breathing  Post-op Assessment: Report given to RN and Post -op Vital signs reviewed and stable  Post vital signs: Reviewed and stable  Patient intermittently hypotensive requirement vasopressor support. Phenylephrine  given to patient in recovery. Dr. Elby Green notified.   Last Vitals:  Vitals Value Taken Time  BP 102/79 10/20/23 1834  Temp    Pulse 76 10/20/23 1839  Resp 13 10/20/23 1839  SpO2 99 % 10/20/23 1839  Vitals shown include unfiled device data.  Last Pain:  Vitals:   10/20/23 1347  TempSrc:   PainSc: 0-No pain         Complications: There were no known notable events for this encounter.

## 2023-10-20 NOTE — Interval H&P Note (Signed)
 History and Physical Interval Note:  10/20/2023 2:26 PM  Philip Winters  has presented today for surgery, with the diagnosis of Severe Aortic Stenosis.  The various methods of treatment have been discussed with the patient and family. After consideration of risks, benefits and other options for treatment, the patient has consented to  Procedure(s): Transcatheter Aortic Valve Replacement, Transfemoral (Right) ECHOCARDIOGRAM, TRANSTHORACIC (N/A) as a surgical intervention.  The patient's history has been reviewed, patient examined, no change in status, stable for surgery.  I have reviewed the patient's chart and labs.  Questions were answered to the patient's satisfaction.     Aleshia Cartelli K Chimaobi Casebolt

## 2023-10-20 NOTE — CV Procedure (Signed)
 [facility]  TAVR OPERATIVE NOTE   Date of Procedure:  10/20/2023  Preoperative Diagnosis: Severe Aortic Stenosis   Postoperative Diagnosis: Same   Procedure:   Transcatheter Aortic Valve Replacement - Transfemoral Approach  Edwards Sapien 3 THV (size 26 mm, model # [id number], serial # [id number] )   Co-Surgeons:  [name], MD and Linder Revere , MD   Anesthesiologist:  Denese Finn  Echocardiographer:  Stann Earnest  Pre-operative Echo Findings: Severe aortic stenosis Normal left ventricular systolic function  Post-operative Echo Findings: No paravalvular leak Normal left ventricular systolic function  BRIEF CLINICAL NOTE AND INDICATIONS FOR SURGERY  63 yo male with history of CAD s/p 2V CABG in December 2021, bicuspid aortic valve with severe stenosis, thoracic aortic aneurysm, HTN, HLD, spinal stenosis and peripheral neuropathy. Echo 06/30/23 with LVEF=50-55%. Severe paradoxical low flow/low gradient aortic valve stenosis with mean gradient 26 mm Hg, AVA 0.49 cm2, SVI 18, DI 0.16.   During the course of the patient's preoperative work up they have been evaluated comprehensively by a multidisciplinary team of specialists coordinated through the [facility] in the [facility].  They have been demonstrated to suffer from symptomatic severe aortic stenosis as noted above. The patient has been counseled extensively as to the relative risks and benefits of all options for the treatment of severe aortic stenosis including long term medical therapy, conventional surgery for aortic valve replacement, and transcatheter aortic valve replacement.  The patient has been independently evaluated by Dr. Sherene Dilling with CT surgery and they are felt to be at high risk for conventional surgical aortic valve replacement. The surgeon indicated the patient would be a poor candidate for conventional surgery. Based upon review of all of the patient's  preoperative diagnostic tests they are felt to be candidate for transcatheter aortic valve replacement using the transfemoral approach as an alternative to high risk conventional surgery.    Following the decision to proceed with transcatheter aortic valve replacement, a discussion has been held regarding what types of management strategies would be attempted intraoperatively in the event of life-threatening complications, including whether or not the patient would be considered a candidate for the use of cardiopulmonary bypass and/or conversion to open sternotomy for attempted surgical intervention.  The patient has been advised of a variety of complications that might develop peculiar to this approach including but not limited to risks of death, stroke, paravalvular leak, aortic dissection or other major vascular complications, aortic annulus rupture, device embolization, cardiac rupture or perforation, acute myocardial infarction, arrhythmia, heart block or bradycardia requiring permanent pacemaker placement, congestive heart failure, respiratory failure, renal failure, pneumonia, infection, other late complications related to structural valve deterioration or migration, or other complications that might ultimately cause a temporary or permanent loss of functional independence or other long term morbidity.  The patient provides full informed consent for the procedure as described and all questions were answered preoperatively.    DETAILS OF THE OPERATIVE PROCEDURE  PREPARATION:   The patient is brought to the operating room on the above mentioned date and central monitoring was established by the anesthesia team including placement of a radial arterial line. The patient is placed in the supine position on the operating table.  Intravenous antibiotics are administered. Conscious sedation is used.   Baseline transthoracic echocardiogram was performed. The patient's chest, abdomen, both groins, and both  lower extremities are prepared and draped in a sterile manner. A time out procedure is performed.   PERIPHERAL ACCESS:  Using the modified Seldinger technique, femoral arterial and venous access were obtained with placement of a 6 Fr sheath in the artery and a 7 Fr sheath in the vein on the left side using u/s guidance.  A pigtail diagnostic catheter was passed through the femoral arterial sheath under fluoroscopic guidance into the aortic root.  A temporary transvenous pacemaker catheter was passed through the femoral venous sheath under fluoroscopic guidance into the right ventricle.  The pacemaker was tested to ensure stable lead placement and pacemaker capture. Aortic root angiography was performed in order to determine the optimal angiographic angle for valve deployment.  TRANSFEMORAL ACCESS:  A micropuncture kit was used to gain access to the right femoral artery using u/s guidance. Position confirmed with angiography. Pre-closure with double ProGlide closure devices. The patient was heparinized systemically and ACT verified > 250 seconds.    A 14 Fr transfemoral E-sheath was introduced into the right femoral artery after progressively dilating over an Amplatz superstiff wire. An AL-2 catheter was used to direct a straight-tip exchange length wire across the native aortic valve into the left ventricle. This was exchanged out for a pigtail catheter and position was confirmed in the LV apex. Simultaneous LV and Ao pressures were recorded.  The pigtail catheter was then exchanged for a Safari wire in the LV apex.   TRANSCATHETER HEART VALVE DEPLOYMENT:  An Edwards Sapien 3 THV (size 26 mm) was prepared and crimped per manufacturer's guidelines, and the proper orientation of the valve is confirmed on the [employer] delivery system. The valve was advanced through the introducer sheath using normal technique until in an appropriate position in the abdominal aorta beyond the sheath tip. The  balloon was then retracted and using the fine-tuning wheel was centered on the valve. The valve was then advanced across the aortic arch using appropriate flexion of the catheter. The valve was carefully positioned across the aortic valve annulus. The Commander catheter was retracted using normal technique. Once final position of the valve has been confirmed by angiographic assessment, the valve is deployed while temporarily holding ventilation and during rapid ventricular pacing to maintain systolic blood pressure < 50 mmHg and pulse pressure < 10 mmHg. The balloon inflation is held for >3 seconds after reaching full deployment volume. Once the balloon has fully deflated the balloon is retracted into the ascending aorta and valve function is assessed using TTE. There is felt to be no paravalvular leak and no central aortic insufficiency.  The patient's hemodynamic recovery following valve deployment is good.  The deployment balloon and guidewire are both removed. Echo demostrated acceptable post-procedural gradients, stable mitral valve function, and no AI.   The patient had ST elevation just before valve deployment, possibly due to a coronary air embolus. Diagnostic coronary angiography performed with a JR4, JL4 and AL1. Coronaries unchanged from last cath.   PROCEDURE COMPLETION:  The sheath was then removed and closure devices were completed. Protamine  was administered once femoral arterial repair was complete. The temporary pacemaker, pigtail catheters and femoral sheaths were removed with a Mynx closure device placed in the artery and manual pressure used for venous hemostasis.    The patient tolerated the procedure well and is transported to the surgical intensive care in stable condition. There were no immediate intraoperative complications. All sponge instrument and needle counts are verified correct at completion of the operation.   No blood products were administered during the operation.  The  patient received a total of 90 mL of intravenous  contrast during the procedure.  LVEDP: 18 mm Hg  [name] MD, FACC 10/20/2023 6:35 PM

## 2023-10-20 NOTE — Anesthesia Postprocedure Evaluation (Signed)
 Anesthesia Post Note  Patient: Philip Winters  Procedure(s) Performed: Transcatheter Aortic Valve Replacement, Transfemoral (Right) ECHOCARDIOGRAM, TRANSTHORACIC CORONARY ANGIOGRAPHY     Patient location during evaluation: Cath Lab Anesthesia Type: MAC Level of consciousness: awake and alert Pain management: pain level controlled Vital Signs Assessment: post-procedure vital signs reviewed and stable Respiratory status: spontaneous breathing, nonlabored ventilation, respiratory function stable and patient connected to nasal cannula oxygen Cardiovascular status: stable and blood pressure returned to baseline Postop Assessment: no apparent nausea or vomiting Anesthetic complications: no   There were no known notable events for this encounter.  Last Vitals:  Vitals:   10/20/23 1950 10/20/23 1955  BP: 104/81 102/76  Pulse: 86 88  Resp: 15 16  Temp:    SpO2: 94% 93%    Last Pain:  Vitals:   10/20/23 1934  TempSrc: Temporal  PainSc:                  [name]

## 2023-10-20 NOTE — Anesthesia Preprocedure Evaluation (Signed)
 Anesthesia Evaluation  Patient identified by MRN, date of birth, ID band Patient awake    Reviewed: Allergy & Precautions, NPO status , Patient's Chart, lab work & pertinent test results  Airway Mallampati: II  TM Distance: >3 FB Neck ROM: Full    Dental  (+) Teeth Intact, Dental Advisory Given   Pulmonary former smoker   Pulmonary exam normal breath sounds clear to auscultation       Cardiovascular hypertension, Pt. on medications + CAD, + CABG and + Peripheral Vascular Disease  + Valvular Problems/Murmurs AS  Rhythm:Regular Rate:Normal + Systolic murmurs    Neuro/Psych  Neuromuscular disease    GI/Hepatic Neg liver ROS,GERD  ,,  Endo/Other  negative endocrine ROS    Renal/GU negative Renal ROS     Musculoskeletal  (+) Arthritis ,    Abdominal   Peds  Hematology negative hematology ROS (+)   Anesthesia Other Findings Day of surgery medications reviewed with the patient.  Reproductive/Obstetrics                             Anesthesia Physical Anesthesia Plan  ASA: 4  Anesthesia Plan: MAC   Post-op Pain Management: Tylenol  PO (pre-op)*   Induction: Intravenous  PONV Risk Score and Plan: 2 and TIVA, Dexamethasone  and Ondansetron   Airway Management Planned: Natural Airway and Simple Face Mask  Additional Equipment: Arterial line  Intra-op Plan:   Post-operative Plan:   Informed Consent: I have reviewed the patients History and Physical, chart, labs and discussed the procedure including the risks, benefits and alternatives for the proposed anesthesia with the patient or authorized representative who has indicated his/her understanding and acceptance.     Dental advisory given  Plan Discussed with: CRNA  Anesthesia Plan Comments:        Anesthesia Quick Evaluation

## 2023-10-21 ENCOUNTER — Inpatient Hospital Stay (HOSPITAL_COMMUNITY)

## 2023-10-21 ENCOUNTER — Encounter (HOSPITAL_COMMUNITY): Payer: Self-pay | Admitting: Surgery

## 2023-10-21 ENCOUNTER — Encounter (HOSPITAL_COMMUNITY)
Admission: RE | Disposition: A | Discharge status: Home / Self Care | Payer: Self-pay | Source: Home / Self Care | Attending: Cardiovascular Disease

## 2023-10-21 DIAGNOSIS — I2510 Atherosclerotic heart disease of native coronary artery without angina pectoris: Secondary | ICD-10-CM

## 2023-10-21 DIAGNOSIS — I3139 Other pericardial effusion (noninflammatory): Secondary | ICD-10-CM

## 2023-10-21 DIAGNOSIS — I350 Nonrheumatic aortic (valve) stenosis: Secondary | ICD-10-CM | POA: Diagnosis not present

## 2023-10-21 DIAGNOSIS — Z952 Presence of prosthetic heart valve: Principal | ICD-10-CM

## 2023-10-21 DIAGNOSIS — I309 Acute pericarditis, unspecified: Secondary | ICD-10-CM

## 2023-10-21 DIAGNOSIS — Z954 Presence of other heart-valve replacement: Secondary | ICD-10-CM

## 2023-10-21 DIAGNOSIS — I509 Heart failure, unspecified: Secondary | ICD-10-CM | POA: Diagnosis not present

## 2023-10-21 HISTORY — PX: CORONARY ANGIOGRAPHY: CATH118303

## 2023-10-21 HISTORY — PX: RIGHT HEART CATH: CATH118263

## 2023-10-21 HISTORY — DX: Presence of prosthetic heart valve: Z95.2

## 2023-10-21 LAB — ECHOCARDIOGRAM COMPLETE
AR max vel: 2.09 cm2
AV Area VTI: 2.82 cm2
AV Area mean vel: 2.18 cm2
AV Mean grad: 4 mmHg
AV Peak grad: 8.8 mmHg
Ao pk vel: 1.48 m/s
Height: 68 in
Weight: 2550.28 [oz_av]

## 2023-10-21 LAB — BASIC METABOLIC PANEL WITH GFR
Anion gap: 15 (ref 5–15)
BUN: 18 mg/dL (ref 8–23)
CO2: 16 mmol/L — ABNORMAL LOW (ref 22–32)
Calcium: 8.9 mg/dL (ref 8.9–10.3)
Chloride: 103 mmol/L (ref 98–111)
Creatinine, Ser: 0.7 mg/dL (ref 0.61–1.24)
GFR, Estimated: >60 mL/min (ref >60)
Glucose, Bld: 148 mg/dL — ABNORMAL HIGH (ref 70–99)
Potassium: 4.3 mmol/L (ref 3.5–5.1)
Sodium: 134 mmol/L — ABNORMAL LOW (ref 135–145)

## 2023-10-21 LAB — CBC
HCT: 36.9 % — ABNORMAL LOW (ref 39.0–52.0)
Hemoglobin: 12.2 g/dL — ABNORMAL LOW (ref 13.0–17.0)
MCH: 33.4 pg (ref 26.0–34.0)
MCHC: 33.1 g/dL (ref 30.0–36.0)
MCV: 101.1 fL — ABNORMAL HIGH (ref 80.0–100.0)
Platelets: 154 10*3/uL (ref 150–400)
RBC: 3.65 MIL/uL — ABNORMAL LOW (ref 4.22–5.81)
RDW: 11.9 % (ref 11.5–15.5)
WBC: 12.1 10*3/uL — ABNORMAL HIGH (ref 4.0–10.5)
nRBC: 0 % (ref 0.0–0.2)

## 2023-10-21 LAB — ECHOCARDIOGRAM LIMITED
Height: 68 in
Weight: 2550.28 [oz_av]

## 2023-10-21 LAB — POCT I-STAT EG7
Acid-base deficit: 3 mmol/L — ABNORMAL HIGH (ref 0.0–2.0)
Acid-base deficit: 2 mmol/L (ref 0.0–2.0)
Bicarbonate: 22.6 mmol/L (ref 20.0–28.0)
Bicarbonate: 23.1 mmol/L (ref 20.0–28.0)
Calcium, Ion: 1.25 mmol/L (ref 1.15–1.40)
Calcium, Ion: 1.24 mmol/L (ref 1.15–1.40)
HCT: 34 % — ABNORMAL LOW (ref 39.0–52.0)
HCT: 34 % — ABNORMAL LOW (ref 39.0–52.0)
Hemoglobin: 11.6 g/dL — ABNORMAL LOW (ref 13.0–17.0)
Hemoglobin: 11.6 g/dL — ABNORMAL LOW (ref 13.0–17.0)
O2 Saturation: 51 %
O2 Saturation: 49 %
Potassium: 4.4 mmol/L (ref 3.5–5.1)
Potassium: 4.4 mmol/L (ref 3.5–5.1)
Sodium: 133 mmol/L — ABNORMAL LOW (ref 135–145)
Sodium: 133 mmol/L — ABNORMAL LOW (ref 135–145)
TCO2: 24 mmol/L (ref 22–32)
TCO2: 24 mmol/L (ref 22–32)
pCO2, Ven: 40.5 mmHg — ABNORMAL LOW (ref 44–60)
pCO2, Ven: 41.1 mmHg — ABNORMAL LOW (ref 44–60)
pH, Ven: 7.353 (ref 7.25–7.43)
pH, Ven: 7.358 (ref 7.25–7.43)
pO2, Ven: 28 mmHg — CL (ref 32–45)
pO2, Ven: 27 mmHg — CL (ref 32–45)

## 2023-10-21 LAB — TROPONIN I (HIGH SENSITIVITY): Troponin I (High Sensitivity): 4392 ng/L (ref <18)

## 2023-10-21 LAB — LACTIC ACID, PLASMA: Lactic Acid, Venous: 1 mmol/L (ref 0.5–1.9)

## 2023-10-21 LAB — SEDIMENTATION RATE: Sed Rate: 10 mm/h (ref 0–16)

## 2023-10-21 LAB — D-DIMER, QUANTITATIVE: D-Dimer, Quant: 2.86 ug{FEU}/mL — ABNORMAL HIGH (ref 0.00–0.50)

## 2023-10-21 LAB — C-REACTIVE PROTEIN: CRP: <0.5 mg/dL (ref <1.0)

## 2023-10-21 LAB — MAGNESIUM: Magnesium: 1.8 mg/dL (ref 1.7–2.4)

## 2023-10-21 SURGERY — RIGHT HEART CATH
Anesthesia: LOCAL

## 2023-10-21 MED ORDER — VANCOMYCIN HCL IN DEXTROSE 1-5 GM/200ML-% IV SOLN
1000.0000 mg | INTRAVENOUS | Status: AC
  Administered 2023-10-22: 1000 mg via INTRAVENOUS

## 2023-10-21 MED ORDER — KETOROLAC TROMETHAMINE 15 MG/ML IJ SOLN: 15.0000 mg | Freq: Four times a day (QID) | INTRAMUSCULAR | Status: DC

## 2023-10-21 MED ORDER — SODIUM CHLORIDE 0.9 % IV SOLN: 250.0000 mL | INTRAVENOUS | Status: DC | PRN

## 2023-10-21 MED ORDER — ATROPINE SULFATE 1 MG/10ML IJ SOSY
PREFILLED_SYRINGE | INTRAMUSCULAR | Status: AC
  Filled 2023-10-21: qty 10

## 2023-10-21 MED ORDER — LEVOFLOXACIN IN D5W 750 MG/150ML IV SOLN
750.0000 mg | INTRAVENOUS | Status: AC
  Filled 2023-10-21: qty 150

## 2023-10-21 MED ORDER — COLCHICINE 0.6 MG PO TABS
0.6000 mg | ORAL_TABLET | Freq: Every day | ORAL | Status: DC
  Administered 2023-10-21 – 2023-10-26 (×6): 0.6 mg via ORAL
  Filled 2023-10-21 (×7): qty 1

## 2023-10-21 MED ORDER — ORAL CARE MOUTH RINSE: 15.0000 mL | OROMUCOSAL | Status: DC | PRN

## 2023-10-21 MED ORDER — MIDAZOLAM HCL 2 MG/2ML IJ SOLN
INTRAMUSCULAR | Status: DC | PRN
  Administered 2023-10-21 (×2): 1 mg via INTRAVENOUS

## 2023-10-21 MED ORDER — IOHEXOL 350 MG/ML SOLN
100.0000 mL | Freq: Once | INTRAVENOUS | Status: AC | PRN
  Administered 2023-10-21: 100 mL via INTRAVENOUS

## 2023-10-21 MED ORDER — HYDRALAZINE HCL 20 MG/ML IJ SOLN: 10.0000 mg | INTRAMUSCULAR | Status: AC | PRN

## 2023-10-21 MED ORDER — VERAPAMIL HCL 2.5 MG/ML IV SOLN
INTRAVENOUS | Status: AC
  Filled 2023-10-21: qty 2

## 2023-10-21 MED ORDER — ALPRAZOLAM 0.25 MG PO TABS
0.2500 mg | ORAL_TABLET | Freq: Three times a day (TID) | ORAL | Status: DC | PRN
  Administered 2023-10-21 – 2023-10-25 (×8): 0.25 mg via ORAL
  Filled 2023-10-21 (×8): qty 1

## 2023-10-21 MED ORDER — MORPHINE SULFATE (PF) 2 MG/ML IV SOLN
2.0000 mg | INTRAVENOUS | Status: DC | PRN
  Administered 2023-10-21 – 2023-10-23 (×8): 2 mg via INTRAVENOUS
  Filled 2023-10-21 (×8): qty 1

## 2023-10-21 MED ORDER — IOHEXOL 350 MG/ML SOLN
INTRAVENOUS | Status: DC | PRN
  Administered 2023-10-21: 25 mL

## 2023-10-21 MED ORDER — FENTANYL CITRATE (PF) 100 MCG/2ML IJ SOLN
INTRAMUSCULAR | Status: AC
  Filled 2023-10-21: qty 2

## 2023-10-21 MED ORDER — HEPARIN (PORCINE) IN NACL 1000-0.9 UT/500ML-% IV SOLN
INTRAVENOUS | Status: DC | PRN
  Administered 2023-10-21: 1000 mL

## 2023-10-21 MED ORDER — SODIUM CHLORIDE 0.9% FLUSH
3.0000 mL | Freq: Two times a day (BID) | INTRAVENOUS | Status: DC
  Administered 2023-10-21 – 2023-10-22 (×2): 3 mL via INTRAVENOUS

## 2023-10-21 MED ORDER — SODIUM CHLORIDE 0.9 % IV SOLN: INTRAVENOUS | Status: DC

## 2023-10-21 MED ORDER — HEPARIN SODIUM (PORCINE) 1000 UNIT/ML IJ SOLN
INTRAMUSCULAR | Status: AC
  Filled 2023-10-21: qty 10

## 2023-10-21 MED ORDER — METOPROLOL TARTRATE 5 MG/5ML IV SOLN
5.0000 mg | Freq: Once | INTRAVENOUS | Status: AC
  Administered 2023-10-21: 5 mg via INTRAVENOUS
  Filled 2023-10-21: qty 5

## 2023-10-21 MED ORDER — LIDOCAINE HCL (PF) 1 % IJ SOLN
INTRAMUSCULAR | Status: AC
  Filled 2023-10-21: qty 30

## 2023-10-21 MED ORDER — LIDOCAINE HCL (PF) 1 % IJ SOLN
INTRAMUSCULAR | Status: DC | PRN
  Administered 2023-10-21: 2 mL via SUBCUTANEOUS

## 2023-10-21 MED ORDER — FENTANYL CITRATE (PF) 100 MCG/2ML IJ SOLN
INTRAMUSCULAR | Status: DC | PRN
  Administered 2023-10-21 (×2): 25 ug via INTRAVENOUS

## 2023-10-21 MED ORDER — VERAPAMIL HCL 2.5 MG/ML IV SOLN
INTRAVENOUS | Status: DC | PRN
  Administered 2023-10-21: 10 mL via INTRA_ARTERIAL

## 2023-10-21 MED ORDER — DILTIAZEM HCL 30 MG PO TABS
30.0000 mg | ORAL_TABLET | Freq: Four times a day (QID) | ORAL | Status: DC
  Administered 2023-10-21: 30 mg via ORAL
  Filled 2023-10-21: qty 1

## 2023-10-21 MED ORDER — SODIUM CHLORIDE 0.9% FLUSH: 3.0000 mL | INTRAVENOUS | Status: DC | PRN

## 2023-10-21 MED ORDER — DILTIAZEM HCL ER COATED BEADS 240 MG PO CP24
240.0000 mg | ORAL_CAPSULE | Freq: Every day | ORAL | Status: DC
  Administered 2023-10-21: 240 mg via ORAL
  Filled 2023-10-21: qty 1

## 2023-10-21 MED ORDER — MORPHINE SULFATE (PF) 2 MG/ML IV SOLN
2.0000 mg | INTRAVENOUS | Status: DC | PRN
  Administered 2023-10-21 (×3): 2 mg via INTRAVENOUS
  Filled 2023-10-21 (×4): qty 1

## 2023-10-21 MED ORDER — HEPARIN SODIUM (PORCINE) 1000 UNIT/ML IJ SOLN
INTRAMUSCULAR | Status: DC | PRN
Start: 2023-10-21 — End: 2023-10-21
  Administered 2023-10-21: 3000 [IU] via INTRAVENOUS

## 2023-10-21 MED ORDER — MIDAZOLAM HCL 2 MG/2ML IJ SOLN
INTRAMUSCULAR | Status: AC
  Filled 2023-10-21: qty 2

## 2023-10-21 MED ORDER — ALPRAZOLAM 0.5 MG PO TABS
0.5000 mg | ORAL_TABLET | Freq: Once | ORAL | Status: AC
  Administered 2023-10-21: 0.5 mg via ORAL
  Filled 2023-10-21: qty 1

## 2023-10-21 SURGICAL SUPPLY — 14 items
CATH INFINITI 5FR AL1 (CATHETERS) IMPLANT
CATH INFINITI 5FR MULTPACK ANG (CATHETERS) IMPLANT
CATH SWAN GANZ 7F STRAIGHT (CATHETERS) IMPLANT
DEVICE RAD COMP TR BAND LRG (VASCULAR PRODUCTS) IMPLANT
GLIDESHEATH SLEND SS 6F .021 (SHEATH) IMPLANT
GUIDEWIRE INQWIRE 1.5J.035X260 (WIRE) IMPLANT
KIT HEART LEFT (KITS) IMPLANT
PACK CARDIAC CATHETERIZATION (CUSTOM PROCEDURE TRAY) ×1 IMPLANT
SHEATH PINNACLE 7F 10CM (SHEATH) IMPLANT
SHEATH PINNACLE 8F 10CM (SHEATH) IMPLANT
SHEATH PROBE COVER 6X72 (BAG) IMPLANT
STOPCOCK MORSE 400PSI 3WAY (MISCELLANEOUS) IMPLANT
TUBING ART PRESS 72 MALE/FEM (TUBING) IMPLANT
WIRE MICRO SET SILHO 5FR 7 (SHEATH) IMPLANT

## 2023-10-21 NOTE — Consult Note (Addendum)
 Advanced Heart Failure Team Consult Note   Primary Physician: [name], MD Cardiologist:  [name], MD  Reason for Consultation: Pericardial effusion  HPI:    Philip Winters is seen today for evaluation of pericardial effusion at the request of Jeronimo Moors, PA/Dr. Abel Hoe with [facility] structural heart team.   63 y.o. male with history of CAD S/p CABG X 2 (LIMA to LAD and SVG to D1) in 2021, PAD, HTN, HLD, bilateral carpal tunnel, peripheral neuropathy, inappropriate sinus tachycardia, bicuspid aortic valve with low flow low gradient severe AS, ascending aortic aneurysm.  Underwent TAVR with 26 mm S3 on 10/20/23. Limited echo post procedure with preserved EF and mean gradient 2 mmHg and no paravalvular leak  Patient noted that post TAVR he developed left sided chest pain that radiated to his back. Pain persisted throughout the day, worse with inspiration and when lying flat. ECGs with sinus tachycardia and ST depressions in leads V2-V3. CT of chest obtained this am and was reviewed by Dr. Mitzie Anda. Posterior pericardial effusion present. On preliminary review of today's echo he has at least moderate pericardial effusion with possible early tamponade. Plan is for RHC with possible pericardiocentesis.  Home Medications Prior to Admission medications   Medication Sig Start Date End Date Taking? Authorizing Provider  Alirocumab  (PRALUENT ) 75 MG/ML SOAJ Inject 1 mL (75 mg total) into the skin every 14 (fourteen) days. 05/05/23  Yes Hugh Madura, MD  aspirin  EC 81 MG tablet Take 1 tablet (81 mg total) by mouth in the morning. Swallow whole. 04/24/22  Yes Dawley, Troy C, DO  CARTIA  XT 240 MG 24 hr capsule Take 240 mg by mouth daily. 05/09/23  Yes [provider]  doxycycline  (VIBRAMYCIN ) 100 MG capsule Take 2 tablets by mouth 1 hour prior to dental procedure. 04/14/23  Yes Wes Hamman, MD  fluticasone  (FLONASE ) 50 MCG/ACT nasal spray Place 2 sprays into both nostrils daily as  needed for allergies.   Yes [provider]  Multiple Vitamin (MULTIVITAMIN WITH MINERALS) TABS tablet Take 1 tablet by mouth daily.   Yes [provider]  olmesartan  (BENICAR ) 5 MG tablet Take 2 tablets (10 mg total) by mouth in the morning. 07/16/23  Yes Hugh Madura, MD  Polyethyl Glycol-Propyl Glycol (LUBRICANT EYE DROPS) 0.4-0.3 % SOLN Place 1-2 drops into both eyes 3 (three) times daily as needed (dry/irritated eyes).   Yes [provider]  traMADol  (ULTRAM ) 50 MG tablet Take 50 mg by mouth every 6 (six) hours as needed for moderate pain (pain score 4-6).   Yes [provider]  acetaminophen  (TYLENOL ) 500 MG tablet Take 500-1,000 mg by mouth every 6 (six) hours as needed for moderate pain (pain score 4-6).    [provider]  diltiazem  (CARDIZEM  CD) 120 MG 24 hr capsule Take 2 capsules (240 mg total) by mouth daily. Patient not taking: Reported on 10/14/2023 08/12/23   Hugh Madura, MD  hydrOXYzine  (ATARAX ) 50 MG tablet Take 50 mg by mouth at bedtime as needed (itching/sleep).    [provider]  methocarbamol  (ROBAXIN ) 500 MG tablet Take 500 mg by mouth every 8 (eight) hours as needed for muscle spasms.    [provider]    Past Medical History: Past Medical History:  Diagnosis Date   Allergy    Arthritis    CAD (coronary artery disease)    GERD (gastroesophageal reflux disease)    occ   High cholesterol  Hypertension    PAD (peripheral artery disease) (HCC)    Peripheral neuropathy    Right carotid bruit 06/10/2017   S/P TAVR (transcatheter aortic valve replacement) 10/21/2023   s/p TAVR with a 26 mm Edwards S3UR via the TF approach by Dr. Abel Hoe and Dr. Sherene Dilling   Sciatica    Seasonal allergies    Severe aortic stenosis    Sinus tachycardia    Spinal stenosis     Past Surgical History: Past Surgical History:  Procedure Laterality Date   CARDIAC CATHETERIZATION     CARPAL TUNNEL WITH CUBITAL TUNNEL Left  11/10/2013   Procedure: LEFT CARPAL TUNNEL RELEASE;  Surgeon: Amelie Baize, MD;  Location: [facility];  Service: Orthopedics;  Laterality: Left;   CARPECTOMY Left 03/13/2023   Procedure: LEFT WRIST PROXIMAL ROW CARPECTOMY WITH PLACEMENT ALLOGRAFT SPACER / THUMB CMC DENERVATION;  Surgeon: Merrill Abide, MD;  Location: [facility];  Service: Orthopedics;  Laterality: Left;   COLONOSCOPY  2023   CORONARY ARTERY BYPASS GRAFT N/A 05/01/2020   Procedure: CORONARY ARTERY BYPASS GRAFTING (CABG) X 2 ON CARDIOPULMONARY BYPASS. LIMA TO LAD, SVG TO DIAG;  Surgeon: Heriberto London, MD;  Location: [facility] OR;  Service: Open Heart Surgery;  Laterality: N/A;   CORONARY/GRAFT ANGIOGRAPHY N/A 07/30/2023   Procedure: CORONARY/GRAFT ANGIOGRAPHY;  Surgeon: Odie Benne, MD;  Location: [facility];  Service: Cardiovascular;  Laterality: N/A;   CORONARY/GRAFT ANGIOGRAPHY N/A 10/20/2023   Procedure: CORONARY ANGIOGRAPHY;  Surgeon: Odie Benne, MD;  Location: [facility];  Service: Cardiovascular;  Laterality: N/A;   ENDOVEIN HARVEST OF GREATER SAPHENOUS VEIN Right 05/01/2020   Procedure: ENDOVEIN HARVEST OF GREATER SAPHENOUS VEIN;  Surgeon: Heriberto London, MD;  Location: [facility] OR;  Service: Open Heart Surgery;  Laterality: Right;   INTRAOPERATIVE TRANSTHORACIC ECHOCARDIOGRAM N/A 10/20/2023   Procedure: ECHOCARDIOGRAM, TRANSTHORACIC;  Surgeon: Odie Benne, MD;  [facility];  Service: Cardiovascular;  Laterality: N/A;   KNEE ARTHROSCOPY  [id number]   left and right   LUMBAR LAMINECTOMY/DECOMPRESSION MICRODISCECTOMY Left 09/29/2017   Procedure: LEFT LUMBAR TWO - LUMBAR THREELAMINOTOMY/MICRODISCECTOMY;  Surgeon: Yvonna Herder, MD;  Location: [facility] OR;  Service: Neurosurgery;  Laterality: Left;  LEFT LUMBAR 2- LUMBAR 3 LAMINOTOMY/MICRODISCECTOMY   NASAL SINUS SURGERY  05/2015   RIGHT/LEFT HEART CATH AND CORONARY ANGIOGRAPHY N/A  04/10/2020   Procedure: RIGHT/LEFT HEART CATH AND CORONARY ANGIOGRAPHY;  Surgeon: Millicent Ally, MD;  [facility];  Service: Cardiovascular;  Laterality: N/A;   TEE WITHOUT CARDIOVERSION N/A 05/01/2020   Procedure: TRANSESOPHAGEAL ECHOCARDIOGRAM (TEE);  Surgeon: Matt Song, Donata Fryer, MD;  Location: [facility] OR;  Service: Open Heart Surgery;  Laterality: N/A;   TOTAL KNEE ARTHROPLASTY Right 06/24/2016   Procedure: TOTAL KNEE ARTHROPLASTY;  Surgeon: Shirlee Dotter, MD;  Location: [facility];  Service: Orthopedics;  Laterality: Right;   TOTAL KNEE ARTHROPLASTY Left 12/18/2020   Procedure: LEFT TOTAL KNEE ARTHROPLASTY;  Surgeon: Shirlee Dotter, MD;  Location: [facility];  Service: Orthopedics;  Laterality: Left;   TRANSFORAMINAL LUMBAR INTERBODY FUSION W/ MIS 2 LEVEL Left 04/15/2022   Procedure: Minimally Invasive Surgery Decompression and Transforaminal Lumbar Interbody Fusion , Left , Lumbar three-four, Lumbar four-five;  Surgeon: Dawley, Colby Daub, DO;  Location: [facility];  Service: Neurosurgery;  Laterality: Left;   TRIGGER FINGER RELEASE Left 11/10/2013   Procedure: LEFT INDEX A-1 PULLEY RELEASE;  Surgeon: Amelie Baize, MD;  Location: [facility];  Service: Orthopedics;  Laterality: Left;   ULNAR NERVE TRANSPOSITION Left 11/10/2013   Procedure: ULNAR NERVE TRANSPOSITION;  Surgeon: Amelie Baize, MD;  Location: [facility];  Service: Orthopedics;  Laterality: Left;    Family History: Family History  Problem Relation Age of Onset   Hypertension Mother    Throat cancer Mother    COPD Mother    Hypertension Father    Cancer Father        Mesothelioma   Colon cancer Neg Hx    Esophageal cancer Neg Hx    Stomach cancer Neg Hx    Rectal cancer Neg Hx    Colon polyps Neg Hx     Social History: Social History   Socioeconomic History   Marital status: Married    Spouse name: Trevor Fudge   Number of children: 0   Years of education: 12   Highest  education level: Not on file  Occupational History   Occupation: Self employed    Comment: Nutritional therapist   Occupation: Retired Nutritional therapist  Tobacco Use   Smoking status: Former    Current packs/day: 0.00    Average packs/day: 1 pack/day for 37.0 years (37.0 ttl pk-yrs)    Types: Cigarettes    Start date: 05/05/1979    Quit date: 05/04/2016    Years since quitting: 7.4   Smokeless tobacco: Never  Vaping Use   Vaping status: Former  Substance and Sexual Activity   Alcohol  use: Yes    Alcohol /week: 5.0 standard drinks of alcohol     Types: 5 Glasses of wine per week    Comment: weekends only with dinner   Drug use: No   Sexual activity: Yes  Other Topics Concern   Not on file  Social History Narrative   Lives with wife, Trevor Fudge   Caffeine use: 1 Coffee daily   Right handed    Social Drivers of Corporate investment banker Strain: Not on file  Food Insecurity: No Food Insecurity (10/20/2023)   Hunger Vital Sign    Worried About Running Out of Food in the Last Year: Never true    Ran Out of Food in the Last Year: Never true  Transportation Needs: No Transportation Needs (10/21/2023)   PRAPARE - Administrator, Civil Service (Medical): No    Lack of Transportation (Non-Medical): No  Physical Activity: Not on file  Stress: Not on file  Social Connections: Not on file    Allergies:  Allergies  Allergen Reactions   Mushroom Extract Complex (Obsolete) Anaphylaxis   Penicillins Anaphylaxis    Childhood allergy.  Has patient had a PCN reaction causing immediate rash, facial/tongue/throat swelling, SOB or lightheadedness with hypotension: No Has patient had a PCN reaction causing severe rash involving mucus membranes or skin necrosis: No Has patient had a PCN reaction that required hospitalization No Has patient had a PCN reaction occurring within the last 10 years: No If all of the above answers are "NO", then may proceed with Cephalosporin.  (Has taken Keflex  w/o adverse  reaction).    Chantix [Varenicline] Other (See Comments)    headaches    Crestor [Rosuvastatin] Other (See Comments)    myalgia   Dilaudid  [Hydromorphone ] Nausea And Vomiting   Lipitor [Atorvastatin] Other (See Comments)    myalgia   Losartan Potassium Other (See Comments)    Lethargic    Lovaza [Omega-3-Acid Ethyl Esters (Fish)] Other (See Comments)    myalgia   Oxycodone  Hcl Other (See  Comments)    hallucinations   Pravachol [Pravastatin] Other (See Comments)    myalgia   Bystolic [Nebivolol Hcl] Other (See Comments)    Extreme fatigue, not able to focus   Codeine Nausea And Vomiting   Lopressor  [Metoprolol ] Other (See Comments)    Made patient very fatigued, could not focus   Red Dye #40 (Allura Red) Rash   Repatha  [Evolocumab ] Rash and Other (See Comments)    Hip pain and rash    Objective:    Vital Signs:   Temp:  [96.1 F (35.6 C)-98.5 F (36.9 C)] 98.5 F (36.9 C) (06/04 1130) Pulse Rate:  [59-149] 127 (06/04 1515) Resp:  [9-41] 22 (06/04 1430) BP: (59-130)/(47-107) 106/68 (06/04 1515) SpO2:  [87 %-100 %] 98 % (06/04 1515) Weight:  [71.8 kg-72.3 kg] 72.3 kg (06/04 0500) Last BM Date :  (pta)  Weight change: Filed Weights   10/20/23 1310 10/20/23 2015 10/21/23 0500  Weight: 68 kg 71.8 kg 72.3 kg    Intake/Output:   Intake/Output Summary (Last 24 hours) at 10/21/2023 1550 Last data filed at 10/21/2023 1041 Gross per 24 hour  Intake 2163 ml  Output 105 ml  Net 2058 ml      Physical Exam    General:  No distress. Sitting up in chair Neck: JVP to jaw Cor: Regular rate & rhythm, tachy. No rubs, gallops or murmurs. Lungs: clear Abdomen: soft, nontender, nondistended. No hepatosplenomegaly. No bruits or masses. Good bowel sounds. Extremities: no edema Neuro: alert & orientedx3. Affect anxious   Telemetry   Sinus tachycardia 120s-130s  Labs   Basic Metabolic Panel: Recent Labs  Lab 10/16/23 0930 10/20/23 1817 10/20/23 1909 10/21/23 0156  NA  140 139 138 134*  K 4.6 4.1 4.7 4.3  CL 106 103 104 103  CO2 24  --   --  16*  GLUCOSE 90 144* 173* 148*  BUN 8 16 17 18   CREATININE 0.66 0.50* 0.70 0.70  CALCIUM  9.8  --   --  8.9  MG  --   --   --  1.8    Liver Function Tests: Recent Labs  Lab 10/16/23 0930  AST 33  ALT 29  ALKPHOS 89  BILITOT 0.8  PROT 7.5  ALBUMIN  4.3   No results for input(s): "LIPASE", "AMYLASE" in the last 168 hours. No results for input(s): "AMMONIA" in the last 168 hours.  CBC: Recent Labs  Lab 10/16/23 0930 10/20/23 1817 10/20/23 1909 10/21/23 0156  WBC 4.0  --   --  12.1*  HGB 15.6 12.2* 12.6* 12.2*  HCT 45.8 36.0* 37.0* 36.9*  MCV 97.9  --   --  101.1*  PLT 195  --   --  154    Cardiac Enzymes: No results for input(s): "CKTOTAL", "CKMB", "CKMBINDEX", "TROPONINI" in the last 168 hours.  BNP: BNP (last 3 results) No results for input(s): "BNP" in the last 8760 hours.  ProBNP (last 3 results) No results for input(s): "PROBNP" in the last 8760 hours.   CBG: No results for input(s): "GLUCAP" in the last 168 hours.  Coagulation Studies: No results for input(s): "LABPROT", "INR" in the last 72 hours.   Imaging   CT Angio Chest/Abd/Pel for Dissection W and/or W/WO Result Date: 10/21/2023 CLINICAL DATA:  Aortic arteriosclerosis, status post TAVR with chest pain EXAM: CT ANGIOGRAPHY CHEST, ABDOMEN AND PELVIS TECHNIQUE: Multidetector CT imaging through the chest, abdomen and pelvis was performed using the standard protocol during bolus administration of intravenous contrast. Multiplanar  reconstructed images and MIPs were obtained and reviewed to evaluate the vascular anatomy. RADIATION DOSE REDUCTION: This exam was performed according to the departmental dose-optimization program which includes automated exposure control, adjustment of the mA and/or kV according to patient size and/or use of iterative reconstruction technique. CONTRAST:  OMNIPAQUE  IOHEXOL  350 MG/ML SOLN COMPARISON:   August 24, 2023 CT heart and CT chest angio August 24, 2023. FINDINGS: CTA CHEST FINDINGS Cardiovascular: Post TAVR. No evidence of aortic dissections. Atheromatous of the left coronary artery and LAD and proximal circumflex. The right coronary artery is limited due to motion. Not clearly identified. Aortic arch appears unremarkable. Descending thoracic aorta demonstrates no dissections or aneurysms. Ascending aorta measures 3.6 x 3.7 cm in maximum diameter. No evidence of pulmonary embolism. Mediastinum/Nodes: No enlarged mediastinal, hilar, or axillary lymph nodes. Thyroid  gland, trachea, and esophagus demonstrate no significant findings. Lungs/Pleura: Lungs are clear. No pleural effusion or pneumothorax. Vicarious excretion of contrast material through the gallbladder. This recommendation follows the consensus statement: Guidelines for Management of Incidental Pulmonary Nodules Detected on CT Images: From the Fleischner Society 2017; Radiology 2017; 284:228-243 Minimal bilateral basilar hypoventilatory atelectatic changes right more than left without consolidations or significant pleural effusion. Musculoskeletal: No chest wall abnormality. No acute or significant osseous findings. Review of the MIP images confirms the above findings. CTA ABDOMEN AND PELVIS FINDINGS VASCULAR Aorta: Normal caliber aorta without aneurysm, dissection, vasculitis or significant stenosis. 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 SMA: Patent without evidence of aneurysm, dissection, vasculitis or significant stenosis. Renals: Both renal arteries are patent without evidence of aneurysm, dissection, vasculitis, fibromuscular dysplasia or significant stenosis. IMA: Patent without evidence of aneurysm, dissection, vasculitis or significant stenosis. Inflow: Patent without evidence of aneurysm, dissection, vasculitis or significant stenosis. Veins: No obvious venous abnormality within the  limitations of this arterial phase study. Review of the MIP images confirms the above findings. NON-VASCULAR Hepatobiliary: No focal liver abnormality is seen. No gallstones, gallbladder wall thickening, or biliary dilatation. Vicarious excretion of contrast through the gallbladder. Pancreas: Unremarkable. No pancreatic ductal dilatation or surrounding inflammatory changes. Spleen: Normal in size without focal abnormality. Adrenals/Urinary Tract: Adrenal glands are unremarkable. Kidneys are normal, without renal calculi, focal lesion, or hydronephrosis. Bladder is unremarkable. Contrast in the kidneys pelvicalyceal system ureters and bladder from prior examination and this examination. Stomach/Bowel: Stomach is within normal limits. Appendix appears normal. No evidence of bowel wall thickening, distention, or inflammatory changes. Lymphatic: Choose 1 Reproductive: Prostate is unremarkable. Other: Small right inguinal scrotal hernia containing fat. No obstruction Musculoskeletal: Postsurgical changes of the L3-4 disc space. Review of the MIP images confirms the above findings. IMPRESSION: *Post TAVR. No evidence of aortic dissection or aneurysm. *No evidence of pulmonary embolism. 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 *Small right inguinal scrotal hernia containing fat. No obstruction. *Postsurgical changes of the L3-4 disc space. *Aortic atherosclerosis. Electronically Signed   By: Fredrich Jefferson M.D.   On: 10/21/2023 09:12   Structural Heart Procedure Result Date: 10/20/2023 See surgical note for result.  CARDIAC CATHETERIZATION Result Date: 10/20/2023 See surgical note for result.  ECHOCARDIOGRAM LIMITED Result Date: 10/20/2023    ECHOCARDIOGRAM LIMITED REPORT   Patient Name:   Philip Winters Date of Exam: 10/20/2023 Medical Rec #:  [id number]   Height:       68.0 in Accession #:    [id number]  Weight:       150.0 lb  Date of Birth:  11-18-1960   BSA:           1.809 m Patient Age:    62 years    BP:           134/88 mmHg Patient Gender: M           HR:           113 bpm. Exam Location:  Inpatient Procedure: Limited Echo, Color Doppler and Cardiac Doppler (Both Spectral and            Color Flow Doppler were utilized during procedure). Indications:     Aortic Stenosis i35.0  History:         Patient has prior history of Echocardiogram examinations, most                  recent 06/30/2023. CAD; Risk Factors:Hypertension and                  Dyslipidemia.  Sonographer:     Sherline Distel Senior RDCS Referring Phys:  [id number] Odie Benne Diagnosing Phys: Janelle Mediate MD  Sonographer Comments: TAVR IMPRESSIONS  1. Left ventricular ejection fraction, by estimation, is 55 to 60%. The left ventricle has normal function. There is mild left ventricular hypertrophy.  2. Right ventricular systolic function is normal. The right ventricular size is normal.  3. The mitral valve is normal in structure.  4. Pre TAVR: bicuspid valve with calcified raphe between right/left cusp trivial AR with mean gradient 18 peak 29 mmHg supine in lab. AVA 1.4 cm2 with indexed AVA/BSA 0.77 cm2         Post TAVR well positioned 26 mm Sapien 3 valve no PVL mean gradinet 2 peak 4 mmHg with AVA 3.6 cm2. The aortic valve has been repaired/replaced. FINDINGS  Left Ventricle: Left ventricular ejection fraction, by estimation, is 55 to 60%. The left ventricle has normal function. The left ventricular internal cavity size was normal in size. There is mild left ventricular hypertrophy. Right Ventricle: The right ventricular size is normal. Right vetricular wall thickness was not assessed. Right ventricular systolic function is normal. Pericardium: There is no evidence of pericardial effusion. Mitral Valve: The mitral valve is normal in structure. Aortic Valve: Pre TAVR: bicuspid valve with calcified raphe between right/left cusp trivial AR with mean gradient 18 peak 29 mmHg supine in lab. AVA 1.4 cm2 with indexed  AVA/BSA 0.77 cm2 Post TAVR well positioned 26 mm Sapien 3 valve no PVL mean gradinet 2 peak 4 mmHg with AVA 3.6 cm2. The aortic valve has been repaired/replaced. Aortic valve mean gradient measures 2.0 mmHg. Aortic valve peak gradient measures 4.2 mmHg. Aortic valve area, by VTI measures 3.95 cm. Additional Comments: Spectral Doppler performed. Color Doppler performed.  LEFT VENTRICLE PLAX 2D LVOT diam:     2.50 cm LV SV:         75 LV SV Index:   41 LVOT Area:     4.91 cm  AORTIC VALVE AV Area (Vmax):    3.57 cm AV Area (Vmean):   3.34 cm AV Area (VTI):     3.95 cm AV Vmax:           103.00 cm/s AV Vmean:          69.300 cm/s AV VTI:            0.189 m AV Peak Grad:      4.2 mmHg AV Mean Grad:      2.0 mmHg  LVOT Vmax:         75.00 cm/s LVOT Vmean:        47.100 cm/s LVOT VTI:          0.152 m LVOT/AV VTI ratio: 0.80  SHUNTS Systemic VTI:  0.15 m Systemic Diam: 2.50 cm Janelle Mediate MD Electronically signed by Janelle Mediate MD Signature Date/Time: 10/20/2023/6:01:48 PM    Final      Medications:     Current Medications:  [MAR Hold] aspirin  EC  81 mg Oral q AM   [MAR Hold] Chlorhexidine  Gluconate Cloth  6 each Topical Daily   [MAR Hold] diltiazem   240 mg Oral Daily   [MAR Hold] sodium chloride  flush  3 mL Intravenous Q12H    Infusions:  [MAR Hold] sodium chloride      sodium chloride         Patient Profile   63 y.o. male with history of CAD s/p CABG X 2, bicuspid aortic valve with low flow low gradient severe AS, bilateral carpal tunnel syndrome, peripheral neuropathy, inappropriate sinus tachycardia.  Underwent TAVR with 26 mm S3 on 10/20/23. Post procedure developed chest pain and found to have pericardial effusion with concern for tamponade.  Assessment/Plan   Chest pain: -Post TAVR. Pleuritic in nature and worse when lying down.  -ESR and CRP okay. HS troponin pending. Less concern for ACS. However, does have new lateral WMA on echo. Although LCX okay on LHC at time of  TAVR -Pericardial effusion present on CT chest and echo. Findings on echo concerning for possible early tamponade.  -Plan RHC this afternoon to better assess hemodynamics, may need pericardiocentesis vs window.  Informed Consent   Shared Decision Making/Informed Consent The risks [stroke (1 in 1000), death (1 in 1000), kidney failure [usually temporary] (1 in 500), bleeding (1 in 200), allergic reaction [possibly serious] (1 in 200)], benefits (diagnostic support and management of coronary artery disease) and alternatives of a cardiac catheterization were discussed in detail with Mr. Linch and he is willing to proceed.      2. Bicuspid aortic valve with severe LFLG AS - S/p TAVR with 26 mm S3 - Normal functioning valve prosthesis on echo post procedure - Will eventually need workup for cardiac amyloid. Note has bilateral carpal tunnel syndrome and peripheral neuropathy.  3. Sinus tachycardia - Chronic. Hx of inappropriate sinus tach - Hold home diltiazem  and metoprolol , at this point sinus tach may be compensatory  4. CAD - S/p CABG X 2 (LIMA to LAD, SVG to D1) - LHC 03/25: Patent grafts, mild disease LCX, severe disease RCA (small) - ST elevations noted on monitoring prior to TAVR deployment. LHC at that time with patent grafts and LCX okay - Continue aspirin  + statin - See discussion above   Length of Stay: 1  FINCH, LINDSAY N, PA-C  10/21/2023, 3:50 PM    Advanced Heart Failure Team Pager [phone] (M-F; 7a - 5p)  Please contact [facility] for night-coverage after hours (4p -7a ) and weekends on [email or web address]   Patient seen with PA, I formulated the plan and agree with the above note.   Patient had TAVR yesterday.  Immediately post-procedure, ST elevation was noted and his coronaries were reshot with no new disease noted.  All day today, he has had pleuritic chest pain.  Echo was done, showing EF 50%, mid inferolateral and basal anterolateral severe hypokinesis (new), normal  RV, moderate posterior pericardial effusion with concern for early tamponade. He had a CTA chest done today, I  reviewed the study which showed a moderate posterior pericardial effusion which appears to compress the left atrium.   I did RHC with Swan placement, this showed right and left heart filling pressures with near equalization of diastolic pressures; cardiac output was low.  There was no good echo window to allow pericardiocentesis.    Given new lateral wall motion abnormalities, Dr. Abel Hoe to proceed with repeat coronary angiography this afternoon.   General: NAD Neck: JVP 12-14 cm, no thyromegaly or thyroid  nodule.  Lungs: Decreased at bases.  CV: Nondisplaced PMI.  Heart regular S1/S2, no S3/S4, no murmur.  No peripheral edema.  No carotid bruit.  Normal pedal pulses.  Abdomen: Soft, nontender, no hepatosplenomegaly, no distention.  Skin: Intact without lesions or rashes.  Neurologic: Alert and oriented x 3.  Psych: Normal affect. Extremities: No clubbing or cyanosis.  HEENT: Normal.   1. Pericardial effusion: Post-TAVR noted to have moderate pericardial effusion primarily posterior to heart with some evidence for compression of left atrium from CTA chest earlier today.  ?Wire perforation at time of TAVR though pericardial effusion not seen yesterday during procedure.   By echo and RHC, there appears to be at least early tamponade and patient has developed sinus tachycardia with pleuritic and positional chest pain.  No good window for pericardiocentesis (reviewed with Dr. Abel Hoe).   - Will monitor for now, if hemodynamics worsen (keep Swan in place), will need pericardial window.   - Add colchicine  for pleuritic chest pain.  2.  Bicuspid aortic valve with low flow/low gradient severe AS: Now s/p TAVR.  Bioprosthetic valve functioning normally by echo.  - With bilateral carpal tunnel syndrome and peripheral neuropathy, should ultimately have workup for cardiac amyloidosis.  3. CAD:  H/o CABG with LIMA-LAD and SVG-D.  LHC 03/25 with patent grafts, mild disease LCX, severe disease RCA (small). ST elevations noted on monitoring at time of TAVR deployment. Relook LHC at time of TAVR showed patent grafts and LCX okay.  Now with new lateral wall motion abnormality.  - Repeat coronary angiography to be done today by Dr. Abel Hoe.  - ASA/statin.   CRITICAL CARE Performed by: Peder Bourdon  Total critical care time: 75 minutes  Critical care time was exclusive of separately billable procedures and treating other patients.  Critical care was necessary to treat or prevent imminent or life-threatening deterioration.  Critical care was time spent personally by me on the following activities: development of treatment plan with patient and/or surrogate as well as nursing, discussions with consultants, evaluation of patient's response to treatment, examination of patient, obtaining history from patient or surrogate, ordering and performing treatments and interventions, ordering and review of laboratory studies, ordering and review of radiographic studies, pulse oximetry and re-evaluation of patient's condition.   Peder Bourdon 10/21/2023 4:57 PM

## 2023-10-21 NOTE — Significant Event (Signed)
   Called regarding elevated troponin of 4,392. I have reviewed the chart which indicates several active issues. There is a pericardial effusion with early tamponade features and plan for drainage surgically tomorrow. He underwent coronary angiography today, which shows patent coronaries, so the troponin elevation may be due to direct myocardial injury or demand ischemia. There is mention of possible wire perforation. So far, according to nursing, he has had a chronic chest pain that seems responsive to morphine .  He had TAVR yesterday. Given suspicion that the pericardial effusion is hemorrhagic and that there are no new coronary lesions, will not start heparin . Not clear what utility there will be in trending troponin.  Hazle Lites, MD, [facility], FNLA, FACP  [place]  [facility]  Medical Director of the [facility] Lipid Disorders &  Cardiovascular Risk Reduction Clinic Diplomate of the American Board of Clinical Lipidology Attending Cardiologist  Direct Dial: [phone]  Fax: [phone]  Website:  [email or web address]

## 2023-10-21 NOTE — Progress Notes (Addendum)
  [facility]   MULTIDISCIPLINARY HEART VALVE TEAM  Pt continues to have chest pain, worse with inspiration. He remains tachycardic but slightly improved. BP 106/68. CTA this AM with no dissection or effusion. Echo today completed but not formally read. Discussed case with Dr. Julane Ny and Dr. Mitzie Anda who report a new moderate pericardial effusion with dilated IVC. He also has a new WMA in the LCx territory.   Pt did have ST elevation during case yesterday and coronary angiography was completed after TAVR deployment. LCX was okay at that time.   Dr. Mitzie Anda to take to cath lab for RHC and Swan placement. Orders placed and called down from cath lab.  Abagail Hoar PA-C  [facility]  Pager [phone]

## 2023-10-21 NOTE — Interval H&P Note (Signed)
 History and Physical Interval Note:  10/21/2023 3:49 PM  Philip Winters  has presented today for surgery, with the diagnosis of heart failure.  The various methods of treatment have been discussed with the patient and family. After consideration of risks, benefits and other options for treatment, the patient has consented to  Procedure(s): RIGHT HEART CATH (N/A) as a surgical intervention.  The patient's history has been reviewed, patient examined, no change in status, stable for surgery.  I have reviewed the patient's chart and labs.  Questions were answered to the patient's satisfaction.     Philip Winters [employer]

## 2023-10-21 NOTE — Progress Notes (Signed)
 Patient ID: Philip Winters, male   DOB: 12-16-1960, 63 y.o.   MRN: [id number]  TCTS:  CTA of the chest this morning showed no evidence of aortic dissection.  There was a moderate size posterior and lateral pericardial effusion that was significantly compressing the inferior surface of the left atrium.  2D echocardiogram showed a moderate-sized pericardial effusion lateral and posterior to the left ventricle.  Left ventricular ejection fraction was 50 to 55%.  The aortic valve prosthesis was functioning normally with a mean gradient of 4 mmHg.  He was seen by the advanced heart failure team and right heart catheterization performed showing near equalization of pressures with a right atrial mean pressure of 13, RV pressure 30/12, PA pressure of 24/13 with a mean of 20, and PCWP of 17.  Venous oxygen saturation 50% with an arterial sat of 97%.  Cardiac index was 1.7 by Fick and 2.13 by thermodilution.  Coronary angiography was performed by Dr. Abel Hoe and showed stable coronary artery disease with patent LIMA to the LAD and mild diffuse nonobstructive disease in the left circumflex.  There is a patent saphenous vein graft to the diagonal branch.  The RCA was not selectively engaged but was small and diffusely diseased by catheterization yesterday.  He has been hemodynamically stable with a low normal blood pressure in the low 100s with persistent sinus tachycardia in the 115-130 range.  He continues to have some substernal chest discomfort radiating to his back.  He denies any shortness of breath.  I think the best option is to drain his pericardial effusion which is moderate in size and significant compressing the left atrium.  This is not amenable to catheter drainage or subxiphoid pericardial drainage.  I think it will require a left chest approach to reach the posterior and lateral pericardium.  I reviewed the CT scan and echo with Dr. Luna Salinas and we will plan to perform robotic assisted left chest drainage of  the pericardial effusion tomorrow afternoon.  I discussed the procedure with the patient including the possibility that this may require thoracotomy if there are extensive adhesions in the left chest from his prior surgery.  I discussed the alternative of leaving the fluid alone which will likely result in persistent symptoms and could increase.  I discussed the benefits and the risks of surgery including but not limited to bleeding, blood transfusion, injury to the heart or blood vessels, injury to the lung or persistent air leak, and death.  He understands and agrees to proceed.  I discussed all this with the patient's wife by telephone and all of her questions have been answered.  She is in agreement with proceeding tomorrow afternoon.  Bartley Lightning, MD

## 2023-10-21 NOTE — Progress Notes (Signed)
 Echocardiogram 2D Echocardiogram has been performed.  Emmaline Haring Mearle Drew RDCS 10/21/2023, 12:45 PM

## 2023-10-21 NOTE — H&P (View-Only) (Signed)
  [facility]   MULTIDISCIPLINARY HEART VALVE TEAM  Pt continues to have chest pain, worse with inspiration. He remains tachycardic but slightly improved. BP 106/68. CTA this AM with no dissection or effusion. Echo today completed but not formally read. Discussed case with Dr. Julane Ny and Dr. Mitzie Anda who report a new moderate pericardial effusion with dilated IVC. He also has a new WMA in the LCx territory.   Pt did have ST elevation during case yesterday and coronary angiography was completed after TAVR deployment. LCX was okay at that time.   Dr. Mitzie Anda to take to cath lab for RHC and Swan placement. Orders placed and called down from cath lab.  Abagail Hoar PA-C  [facility]  Pager [phone]

## 2023-10-21 NOTE — Progress Notes (Addendum)
 1 Day Post-Op Procedure(s) (LRB): Transcatheter Aortic Valve Replacement, Transfemoral (Right) ECHOCARDIOGRAM, TRANSTHORACIC (N/A) CORONARY ANGIOGRAPHY (N/A) Subjective:  Has had some substernal chest discomfort into back all night relieved with pain meds. It is increased with deep breaths. No SOB. He was transferred to [facility] after procedure since he was still on NE for BP support and had some periop ECG changes.  Had nausea and vomited this am after dose of Robaxin  but had not eaten anything. He has eaten some since.  Rhythm has been sinus tachy all night to 110-130's. He says his baseline is sinus tach even on cardizem  at home. Did not tolerate BB in past with marked lethargy.  Objective: Vital signs in last 24 hours: Temp:  [96.1 F (35.6 C)-98.1 F (36.7 C)] 96.1 F (35.6 C) (06/03 2015) Pulse Rate:  [59-118] 117 (06/04 0545) Cardiac Rhythm: Sinus tachycardia (06/04 0433) Resp:  [9-29] 15 (06/04 0545) BP: (59-134)/(47-107) 102/75 (06/04 0545) SpO2:  [90 %-100 %] 92 % (06/04 0545) Weight:  [68 kg-72.3 kg] 72.3 kg (06/04 0500)  Hemodynamic parameters for last 24 hours:    Intake/Output from previous day: 06/03 0701 - 06/04 0700 In: 2160 [P.O.:960; I.V.:950; IV Piggyback:250] Out: 105 [Urine:100; Blood:5] Intake/Output this shift: No intake/output data recorded.  General appearance: alert and cooperative Neurologic: intact Heart: regular rate and rhythm, S1, S2 normal, no murmur, rub or gallop Lungs: clear to auscultation bilaterally Abdomen: soft, non-tender; bowel sounds norma  Extremities: warm, no edema Wound: groin sites ok  Lab Results: Recent Labs    10/20/23 1909 10/21/23 0156  WBC  --  12.1*  HGB 12.6* 12.2*  HCT 37.0* 36.9*  PLT  --  154   BMET:  Recent Labs    10/20/23 1909 10/21/23 0156  NA 138 134*  K 4.7 4.3  CL 104 103  CO2  --  16*  GLUCOSE 173* 148*  BUN 17 18  CREATININE 0.70 0.70  CALCIUM   --  8.9    PT/INR: No results for input(s):  "LABPROT", "INR" in the last 72 hours. ABG    Component Value Date/Time   PHART 7.350 05/01/2020 1931   HCO3 22.4 05/01/2020 1931   TCO2 23 10/20/2023 1909   ACIDBASEDEF 3.0 (H) 05/01/2020 1931   O2SAT 53.3 05/02/2020 0451   CBG (last 3)  No results for input(s): "GLUCAP" in the last 72 hours.  ECG: sinus tachy 102, mild diffuse ST depression.  Assessment/Plan: S/P Procedure(s) (LRB): Transcatheter Aortic Valve Replacement, Transfemoral (Right) ECHOCARDIOGRAM, TRANSTHORACIC (N/A) CORONARY ANGIOGRAPHY (N/A)  POD 1  Hemodynamics stable with sinus tachy. Will resume cardizem  30 every 6 hrs and if BP tolerates can resume his Cartia  XT tomorrow.  Cause of his SSCP is unclear but it sounds more like pericarditis pain to me. Discussed with Dr. Abel Hoe and we will get CTA chest/abd/pelvis to rule out aortic dissection and pericardial effusion.  He had some ST elevation prior to valve expansion that persisted but improved with time and coronary/graft angio looked ok. We felt that it was likely debris or air bubbles. Intra-op echo looked fine. Will see what his echo looks like this am. Get him up ambulating some in ICU.   LOS: 1 day    Bartley Lightning 10/21/2023

## 2023-10-21 NOTE — Progress Notes (Signed)
Heart rate is too high for accurate echo at this time. 

## 2023-10-21 NOTE — Progress Notes (Signed)
  Echocardiogram 2D Echocardiogram has been performed.  Allisa Einspahr 10/21/2023, 4:50 PM

## 2023-10-21 NOTE — Progress Notes (Signed)
 Left sided chest pain throughout the night that radiated to his back. The pain is similar in intensity to prior in the day. There was no increase in levo overnight. EKG with similar ST depressions present V2-V3 from earlier in the day. Morphine  was used overnight to assist with pain control with mild relief.   Discussed with team at sign-out   Velvet Gibbs, MD MS  [facility] [place]

## 2023-10-22 ENCOUNTER — Encounter (HOSPITAL_COMMUNITY): Payer: Self-pay | Admitting: Surgery

## 2023-10-22 ENCOUNTER — Inpatient Hospital Stay (HOSPITAL_COMMUNITY)

## 2023-10-22 ENCOUNTER — Inpatient Hospital Stay (HOSPITAL_COMMUNITY): Admitting: Anesthesiology

## 2023-10-22 ENCOUNTER — Other Ambulatory Visit: Payer: Self-pay

## 2023-10-22 ENCOUNTER — Encounter (HOSPITAL_COMMUNITY)
Admission: RE | Disposition: A | Discharge status: Home / Self Care | Payer: Self-pay | Source: Home / Self Care | Attending: Surgery

## 2023-10-22 DIAGNOSIS — I309 Acute pericarditis, unspecified: Secondary | ICD-10-CM | POA: Diagnosis not present

## 2023-10-22 DIAGNOSIS — I2510 Atherosclerotic heart disease of native coronary artery without angina pectoris: Secondary | ICD-10-CM

## 2023-10-22 DIAGNOSIS — I3139 Other pericardial effusion (noninflammatory): Secondary | ICD-10-CM

## 2023-10-22 DIAGNOSIS — Z87891 Personal history of nicotine dependence: Secondary | ICD-10-CM

## 2023-10-22 DIAGNOSIS — I10 Essential (primary) hypertension: Secondary | ICD-10-CM | POA: Diagnosis not present

## 2023-10-22 HISTORY — PX: TEE WITHOUT CARDIOVERSION: SHX5443

## 2023-10-22 HISTORY — PX: XI ROBOTIC ASSISTED PERICARDIAL WINDOW: SHX6870

## 2023-10-22 LAB — BASIC METABOLIC PANEL WITH GFR
Anion gap: 7 (ref 5–15)
BUN: 10 mg/dL (ref 8–23)
CO2: 27 mmol/L (ref 22–32)
Calcium: 9.1 mg/dL (ref 8.9–10.3)
Chloride: 99 mmol/L (ref 98–111)
Creatinine, Ser: 0.61 mg/dL (ref 0.61–1.24)
GFR, Estimated: >60 mL/min (ref >60)
Glucose, Bld: 107 mg/dL — ABNORMAL HIGH (ref 70–99)
Potassium: 4.1 mmol/L (ref 3.5–5.1)
Sodium: 133 mmol/L — ABNORMAL LOW (ref 135–145)

## 2023-10-22 LAB — COOXEMETRY PANEL
Carboxyhemoglobin: 0.9 % (ref 0.5–1.5)
Methemoglobin: <0.7 % (ref 0.0–1.5)
O2 Saturation: 49.7 %
Total hemoglobin: 12.3 g/dL (ref 12.0–16.0)

## 2023-10-22 LAB — CBC
HCT: 33.4 % — ABNORMAL LOW (ref 39.0–52.0)
Hemoglobin: 11.6 g/dL — ABNORMAL LOW (ref 13.0–17.0)
MCH: 34.6 pg — ABNORMAL HIGH (ref 26.0–34.0)
MCHC: 34.7 g/dL (ref 30.0–36.0)
MCV: 99.7 fL (ref 80.0–100.0)
Platelets: 114 10*3/uL — ABNORMAL LOW (ref 150–400)
RBC: 3.35 MIL/uL — ABNORMAL LOW (ref 4.22–5.81)
RDW: 11.9 % (ref 11.5–15.5)
WBC: 8.9 10*3/uL (ref 4.0–10.5)
nRBC: 0 % (ref 0.0–0.2)

## 2023-10-22 LAB — PREPARE RBC (CROSSMATCH)

## 2023-10-22 SURGERY — CREATION, PERICARDIAL WINDOW, ROBOT-ASSISTED
Anesthesia: General

## 2023-10-22 MED ORDER — VASOPRESSIN 20 UNIT/ML IV SOLN
INTRAVENOUS | Status: AC
Start: 2023-10-22 — End: ?
  Filled 2023-10-22: qty 1

## 2023-10-22 MED ORDER — BUPIVACAINE HCL (PF) 0.5 % IJ SOLN
INTRAMUSCULAR | Status: AC
  Filled 2023-10-22: qty 30

## 2023-10-22 MED ORDER — KETAMINE HCL 50 MG/5ML IJ SOSY
PREFILLED_SYRINGE | INTRAMUSCULAR | Status: AC
  Filled 2023-10-22: qty 5

## 2023-10-22 MED ORDER — ETOMIDATE 2 MG/ML IV SOLN
INTRAVENOUS | Status: DC | PRN
  Administered 2023-10-22: 10 mg via INTRAVENOUS

## 2023-10-22 MED ORDER — DEXAMETHASONE SODIUM PHOSPHATE 10 MG/ML IJ SOLN
INTRAMUSCULAR | Status: DC | PRN
  Administered 2023-10-22: 10 mg via INTRAVENOUS

## 2023-10-22 MED ORDER — DEXAMETHASONE SODIUM PHOSPHATE 10 MG/ML IJ SOLN
INTRAMUSCULAR | Status: AC
Start: 2023-10-22 — End: ?
  Filled 2023-10-22: qty 1

## 2023-10-22 MED ORDER — FENTANYL CITRATE (PF) 100 MCG/2ML IJ SOLN
INTRAMUSCULAR | Status: AC
  Filled 2023-10-22: qty 2

## 2023-10-22 MED ORDER — BUPIVACAINE LIPOSOME 1.3 % IJ SUSP
INTRAMUSCULAR | Status: AC
  Filled 2023-10-22: qty 20

## 2023-10-22 MED ORDER — KETAMINE HCL 10 MG/ML IJ SOLN
INTRAMUSCULAR | Status: DC | PRN
  Administered 2023-10-22: 20 mg via INTRAVENOUS

## 2023-10-22 MED ORDER — GUAIFENESIN 100 MG/5ML PO LIQD
5.0000 mL | Freq: Once | ORAL | Status: AC | PRN
  Administered 2023-10-22: 5 mL via ORAL
  Filled 2023-10-22: qty 5

## 2023-10-22 MED ORDER — SODIUM CHLORIDE 0.9% IV SOLUTION: Freq: Once | INTRAVENOUS | Status: DC

## 2023-10-22 MED ORDER — ETOMIDATE 2 MG/ML IV SOLN
INTRAVENOUS | Status: AC
Start: 2023-10-22 — End: ?
  Filled 2023-10-22: qty 10

## 2023-10-22 MED ORDER — ACETAMINOPHEN 10 MG/ML IV SOLN
INTRAVENOUS | Status: AC
  Filled 2023-10-22: qty 100

## 2023-10-22 MED ORDER — EPHEDRINE 5 MG/ML INJ
INTRAVENOUS | Status: AC
  Filled 2023-10-22: qty 5

## 2023-10-22 MED ORDER — ESMOLOL HCL 100 MG/10ML IV SOLN
INTRAVENOUS | Status: AC
  Filled 2023-10-22: qty 10

## 2023-10-22 MED ORDER — FENTANYL CITRATE (PF) 250 MCG/5ML IJ SOLN
INTRAMUSCULAR | Status: DC | PRN
  Administered 2023-10-22 (×2): 50 ug via INTRAVENOUS

## 2023-10-22 MED ORDER — VANCOMYCIN HCL IN DEXTROSE 1-5 GM/200ML-% IV SOLN
1000.0000 mg | Freq: Two times a day (BID) | INTRAVENOUS | Status: AC
  Administered 2023-10-23: 1000 mg via INTRAVENOUS
  Filled 2023-10-22: qty 200

## 2023-10-22 MED ORDER — ONDANSETRON HCL 4 MG/2ML IJ SOLN
INTRAMUSCULAR | Status: DC | PRN
  Administered 2023-10-22: 4 mg via INTRAVENOUS

## 2023-10-22 MED ORDER — PHENYLEPHRINE 80 MCG/ML (10ML) SYRINGE FOR IV PUSH (FOR BLOOD PRESSURE SUPPORT)
PREFILLED_SYRINGE | INTRAVENOUS | Status: AC
  Filled 2023-10-22: qty 30

## 2023-10-22 MED ORDER — DEXAMETHASONE SODIUM PHOSPHATE 10 MG/ML IJ SOLN
INTRAMUSCULAR | Status: AC
  Filled 2023-10-22: qty 1

## 2023-10-22 MED ORDER — DROPERIDOL 2.5 MG/ML IJ SOLN
0.6250 mg | Freq: Once | INTRAMUSCULAR | Status: DC | PRN
Start: 2023-10-22 — End: 2023-10-22

## 2023-10-22 MED ORDER — BUPIVACAINE LIPOSOME 1.3 % IJ SUSP
INTRAMUSCULAR | Status: DC | PRN
  Administered 2023-10-22: 24 mL

## 2023-10-22 MED ORDER — FENTANYL CITRATE (PF) 100 MCG/2ML IJ SOLN: 25.0000 ug | INTRAMUSCULAR | Status: DC | PRN

## 2023-10-22 MED ORDER — FENTANYL CITRATE (PF) 250 MCG/5ML IJ SOLN
INTRAMUSCULAR | Status: AC
  Filled 2023-10-22: qty 5

## 2023-10-22 MED ORDER — MIDAZOLAM HCL 2 MG/2ML IJ SOLN
INTRAMUSCULAR | Status: AC
  Filled 2023-10-22: qty 2

## 2023-10-22 MED ORDER — LACTATED RINGERS IV SOLN: INTRAVENOUS | Status: DC | PRN

## 2023-10-22 MED ORDER — ONDANSETRON HCL 4 MG/2ML IJ SOLN
INTRAMUSCULAR | Status: AC
  Filled 2023-10-22: qty 2

## 2023-10-22 MED ORDER — PANTOPRAZOLE SODIUM 40 MG PO TBEC
40.0000 mg | DELAYED_RELEASE_TABLET | Freq: Every day | ORAL | Status: DC
  Administered 2023-10-23 – 2023-10-26 (×4): 40 mg via ORAL
  Filled 2023-10-22 (×4): qty 1

## 2023-10-22 MED ORDER — MIDAZOLAM HCL 5 MG/5ML IJ SOLN
INTRAMUSCULAR | Status: DC | PRN
  Administered 2023-10-22: 2 mg via INTRAVENOUS

## 2023-10-22 MED ORDER — PROPOFOL 10 MG/ML IV BOLUS
INTRAVENOUS | Status: DC | PRN
  Administered 2023-10-22: 100 mg via INTRAVENOUS

## 2023-10-22 MED ORDER — SUGAMMADEX SODIUM 200 MG/2ML IV SOLN
INTRAVENOUS | Status: DC | PRN
  Administered 2023-10-22: 200 mg via INTRAVENOUS

## 2023-10-22 MED ORDER — NOREPINEPHRINE 4 MG/250ML-% IV SOLN
INTRAVENOUS | Status: DC | PRN
  Administered 2023-10-22: 5 ug/min via INTRAVENOUS

## 2023-10-22 MED ORDER — SODIUM CHLORIDE 0.9 % IR SOLN
Status: DC | PRN
  Administered 2023-10-22: 1000 mL

## 2023-10-22 MED ORDER — PROPOFOL 500 MG/50ML IV EMUL
INTRAVENOUS | Status: DC | PRN
  Administered 2023-10-22: 150 ug/kg/min via INTRAVENOUS

## 2023-10-22 MED ORDER — ROCURONIUM BROMIDE 10 MG/ML (PF) SYRINGE
PREFILLED_SYRINGE | INTRAVENOUS | Status: DC | PRN
  Administered 2023-10-22: 70 mg via INTRAVENOUS
  Administered 2023-10-22: 30 mg via INTRAVENOUS
  Administered 2023-10-22: 40 mg via INTRAVENOUS

## 2023-10-22 MED ORDER — ROCURONIUM BROMIDE 10 MG/ML (PF) SYRINGE
PREFILLED_SYRINGE | INTRAVENOUS | Status: AC
  Filled 2023-10-22: qty 10

## 2023-10-22 MED ORDER — SODIUM CHLORIDE 0.9 % IV SOLN
INTRAVENOUS | Status: DC | PRN
Start: 2023-10-22 — End: 2023-10-22

## 2023-10-22 MED ORDER — VANCOMYCIN HCL IN DEXTROSE 1-5 GM/200ML-% IV SOLN: 1000.0000 mg | INTRAVENOUS | Status: AC

## 2023-10-22 MED ORDER — PROPOFOL 10 MG/ML IV BOLUS
INTRAVENOUS | Status: AC
  Filled 2023-10-22: qty 20

## 2023-10-22 MED ORDER — LIDOCAINE 2% (20 MG/ML) 5 ML SYRINGE
INTRAMUSCULAR | Status: AC
  Filled 2023-10-22: qty 5

## 2023-10-22 MED ORDER — EPINEPHRINE 1 MG/10ML IJ SOSY
PREFILLED_SYRINGE | INTRAMUSCULAR | Status: AC
Start: 2023-10-22 — End: ?
  Filled 2023-10-22: qty 10

## 2023-10-22 MED ORDER — 0.9 % SODIUM CHLORIDE (POUR BTL) OPTIME
TOPICAL | Status: DC | PRN
  Administered 2023-10-22: 2000 mL

## 2023-10-22 MED ORDER — KETOROLAC TROMETHAMINE 15 MG/ML IJ SOLN
15.0000 mg | Freq: Once | INTRAMUSCULAR | Status: AC
  Administered 2023-10-22: 15 mg via INTRAVENOUS

## 2023-10-22 MED ORDER — KETOROLAC TROMETHAMINE 15 MG/ML IJ SOLN
INTRAMUSCULAR | Status: AC
  Filled 2023-10-22: qty 1

## 2023-10-22 MED ORDER — ACETAMINOPHEN 10 MG/ML IV SOLN
1000.0000 mg | Freq: Once | INTRAVENOUS | Status: DC | PRN
Start: 2023-10-22 — End: 2023-10-22
  Administered 2023-10-22: 1000 mg via INTRAVENOUS

## 2023-10-22 MED ORDER — SODIUM CHLORIDE 0.9 % IV SOLN: INTRAVENOUS | Status: DC | PRN

## 2023-10-22 SURGICAL SUPPLY — 36 items
BLADE STERNUM SYSTEM 6 (BLADE) IMPLANT
DERMABOND ADVANCED .7 DNX12 (GAUZE/BANDAGES/DRESSINGS) IMPLANT
DRAPE ARM DVNC X/XI (DISPOSABLE) ×8 IMPLANT
DRAPE COLUMN DVNC XI (DISPOSABLE) ×2 IMPLANT
DRAPE CV SPLIT W-CLR ANES SCRN (DRAPES) ×2 IMPLANT
DRAPE SURG ORHT 6 SPLT 77X108 (DRAPES) ×2 IMPLANT
ELECTRODE REM PT RTRN 9FT ADLT (ELECTROSURGICAL) ×2 IMPLANT
FELT TEFLON 1X6 (MISCELLANEOUS) IMPLANT
FORCEPS BPLR LNG DVNC XI (INSTRUMENTS) IMPLANT
GAUZE 4X4 16PLY ~~LOC~~+RFID DBL (SPONGE) IMPLANT
GAUZE SPONGE 4X4 12PLY STRL (GAUZE/BANDAGES/DRESSINGS) IMPLANT
GLOVE SS BIOGEL STRL SZ 7.5 (GLOVE) ×8 IMPLANT
GOWN STRL REUS W/ TWL LRG LVL3 (GOWN DISPOSABLE) ×4 IMPLANT
GOWN STRL REUS W/ TWL XL LVL3 (GOWN DISPOSABLE) ×4 IMPLANT
GOWN STRL REUS W/TWL 2XL LVL3 (GOWN DISPOSABLE) ×2 IMPLANT
GRASPER TIP-UP FEN DVNC XI (INSTRUMENTS) IMPLANT
HEMOSTAT SURGICEL 4X8 (HEMOSTASIS) ×4 IMPLANT
IRRIGATION STRYKERFLOW (MISCELLANEOUS) ×2 IMPLANT
IRRIGATOR SUCT 8 DISP DVNC XI (IRRIGATION / IRRIGATOR) IMPLANT
IV SODIUM CHL 0.9% 500ML (IV SOLUTION) ×2 IMPLANT
KIT SUCTION CATH 14FR (SUCTIONS) IMPLANT
PAD ARMBOARD POSITIONER FOAM (MISCELLANEOUS) ×4 IMPLANT
PAD ELECT DEFIB RADIOL ZOLL (MISCELLANEOUS) ×2 IMPLANT
SEAL UNIV 5-12 XI (MISCELLANEOUS) ×6 IMPLANT
SPONGE T-LAP 18X18 ~~LOC~~+RFID (SPONGE) ×2 IMPLANT
SPONGE T-LAP 4X18 ~~LOC~~+RFID (SPONGE) IMPLANT
SPONGE TONSIL 1 RF SGL (DISPOSABLE) ×2 IMPLANT
SUT SILK 2 0 SH CR/8 (SUTURE) IMPLANT
SUT VIC AB 1 CTX36XBRD ANBCTR (SUTURE) ×2 IMPLANT
SUT VIC AB 2-0 CTX 36 (SUTURE) ×2 IMPLANT
SUT VIC AB 3-0 X1 27 (SUTURE) ×4 IMPLANT
SUT VICRYL 0 TIES 12 18 (SUTURE) ×2 IMPLANT
SYSTEM SAHARA CHEST DRAIN ATS (WOUND CARE) IMPLANT
TOWEL GREEN STERILE (TOWEL DISPOSABLE) IMPLANT
TOWEL GREEN STERILE FF (TOWEL DISPOSABLE) IMPLANT
TRAY FOLEY SLVR 16FR TEMP STAT (SET/KITS/TRAYS/PACK) IMPLANT

## 2023-10-22 NOTE — Progress Notes (Signed)
 Patient ID: Philip Winters, male   DOB: [birth year], 63 y.o.   MRN: [id number]     Advanced Heart Failure Rounding Note  Cardiologist: [name], MD  Chief Complaint: chest pain Subjective:    Chest pain only with deep inspiration today.  Denies dyspnea sitting in bed.  Wants to get out of bed.   HS-TnI 4392.  Cath yesterday showed patent SVG-D, patent LIMA-LAD, and nonobstructive disease in LCx.  Nothing to explain new lateral wall motion abnormality.   Swan: RA 10 PA 32/22 CI 2.36  No labs yet this morning.    Objective:   Weight Range: 71 kg Body mass index is 23.8 kg/m.   Vital Signs:   Temp:  [98.4 F (36.9 C)-99.5 F (37.5 C)] 98.8 F (37.1 C) (06/05 0800) Pulse Rate:  [0-149] 103 (06/05 0800) Resp:  [14-36] 15 (06/05 0800) BP: (96-145)/(68-111) 102/84 (06/05 0800) SpO2:  [89 %-98 %] 96 % (06/05 0800) Weight:  [71 kg] 71 kg (06/05 0500) Last BM Date :  (pta)  Weight change: Filed Weights   10/20/23 2015 10/21/23 0500 10/22/23 0500  Weight: 71.8 kg 72.3 kg 71 kg    Intake/Output:   Intake/Output Summary (Last 24 hours) at 10/22/2023 1006 Last data filed at 10/22/2023 0841 Gross per 24 hour  Intake 838.19 ml  Output 625 ml  Net 213.19 ml      Physical Exam    General:  Well appearing. No resp difficulty HEENT: Normal Neck: Supple. JVP . Carotids 2+ bilat; no bruits. No lymphadenopathy or thyromegaly appreciated. Cor: PMI nondisplaced. Regular rate & rhythm. No rubs, gallops or murmurs. Lungs: Clear Abdomen: Soft, nontender, nondistended. No hepatosplenomegaly. No bruits or masses. Good bowel sounds. Extremities: No cyanosis, clubbing, rash, edema Neuro: Alert & orientedx3, cranial nerves grossly intact. moves all 4 extremities w/o difficulty. Affect pleasant   Telemetry   Sinus tachy 110 (personally reviewed)   Labs    CBC Recent Labs    10/21/23 0156 10/21/23 1609  WBC 12.1*  --   HGB 12.2* 11.6*  11.6*  HCT 36.9* 34.0*  34.0*  MCV  101.1*  --   PLT 154  --    Basic Metabolic Panel Recent Labs    40/98/11 1909 10/21/23 0156 10/21/23 1609  NA 138 134* 133*  133*  K 4.7 4.3 4.4  4.4  CL 104 103  --   CO2  --  16*  --   GLUCOSE 173* 148*  --   BUN 17 18  --   CREATININE 0.70 0.70  --   CALCIUM   --  8.9  --   MG  --  1.8  --    Liver Function Tests No results for input(s): "AST", "ALT", "ALKPHOS", "BILITOT", "PROT", "ALBUMIN " in the last 72 hours. No results for input(s): "LIPASE", "AMYLASE" in the last 72 hours. Cardiac Enzymes No results for input(s): "CKTOTAL", "CKMB", "CKMBINDEX", "TROPONINI" in the last 72 hours.  BNP: BNP (last 3 results) No results for input(s): "BNP" in the last 8760 hours.  ProBNP (last 3 results) No results for input(s): "PROBNP" in the last 8760 hours.   D-Dimer Recent Labs    10/21/23 0156  DDIMER 2.86*   Hemoglobin A1C No results for input(s): "HGBA1C" in the last 72 hours. Fasting Lipid Panel No results for input(s): "CHOL", "HDL", "LDLCALC", "TRIG", "CHOLHDL", "LDLDIRECT" in the last 72 hours. Thyroid  Function Tests No results for input(s): "TSH", "T4TOTAL", "T3FREE", "THYROIDAB" in the last 72 hours.  Invalid input(s): "  FREET3"  Other results:   Imaging    ECHOCARDIOGRAM LIMITED Result Date: 10/21/2023    ECHOCARDIOGRAM LIMITED REPORT   Patient Name:   Philip Winters Date of Exam: 10/21/2023 Medical Rec #:  [id number]   Height:       68.0 in Accession #:    [id number]  Weight:       159.4 lb Date of Birth:  04/16/1961   BSA:          1.856 m Patient Age:    62 years    BP:           106/68 mmHg Patient Gender: M           HR:           120 bpm. Exam Location:  Inpatient Procedure: Limited Echo (Both Spectral and Color Flow Doppler were utilized            during procedure). STAT ECHO Indications:    pericardial effusion  History:        Patient has prior history of Echocardiogram examinations, most                 recent 10/21/2023. PAD, Signs/Symptoms:Chest Pain; Risk                  Factors:Hypertension and Dyslipidemia.                 Aortic Valve: valve is present in the aortic position. Procedure                 Date: 09/19/23.  Sonographer:    Dione Franks RDCS Referring Phys: [id number] Mathan Darroch S Elia Keenum IMPRESSIONS  1. Left ventricular ejection fraction, by estimation, is 50 to 55%. The left ventricle has low normal function.  2. Moderate pericardial effusion. The pericardial effusion is posterior and lateral to the left ventricle.  3. The inferior vena cava is normal in size with <50% respiratory variability, suggesting right atrial pressure of 8 mmHg. Conclusion(s)/Recommendation(s): Limited echo to reassess pericardial effusion. There is a moderate-sized effusion posterior and lateral to the heart. No signficant from previous. FINDINGS  Left Ventricle: Left ventricular ejection fraction, by estimation, is 50 to 55%. The left ventricle has low normal function. Pericardium: A moderately sized pericardial effusion is present. The pericardial effusion is posterior and lateral to the left ventricle. Aortic Valve: There is a valve present in the aortic position. Procedure Date: 09/19/23. Venous: The inferior vena cava is normal in size with less than 50% respiratory variability, suggesting right atrial pressure of 8 mmHg. Jules Oar MD Electronically signed by Jules Oar MD Signature Date/Time: 10/21/2023/6:23:51 PM    Final    CARDIAC CATHETERIZATION Result Date: 10/21/2023 1. Low cardiac output. 2. Elevated filling pressures with near equalization. Concern for at least early tamponade.  Echo done at bedside, pericardial effusion is moderate and posterior.  On CTA chest, noted to be compressing the left atrium.  No good approach via pericardiocentesis.  Plan to monitor for now as BP stable, if hemodynamically worsens will need pericardial window.  Will leave Swan in place.   CARDIAC CATHETERIZATION Result Date: 10/21/2023   Prox LAD to Mid LAD lesion is 90% stenosed.    Mid Cx lesion is 30% stenosed.   Prox RCA lesion is 90% stenosed.   1st Diag lesion is 100% stenosed.   SVG and is normal in caliber.   LIMA and is normal in caliber. Stable CAD with patent LIMA to LAD,  mild diffuse non-obstructive disease in the Circumflex. Patent SVG to Diagonal. I did not selectively image the RCA today as it is small and diffusely diseased by cath yesterday.   ECHOCARDIOGRAM COMPLETE Result Date: 10/21/2023    ECHOCARDIOGRAM REPORT   Patient Name:   Philip Winters Date of Exam: 10/21/2023 Medical Rec #:  [id number]   Height:       68.0 in Accession #:    [id number]  Weight:       159.4 lb Date of Birth:  Dec 10, 1960   BSA:          1.856 m Patient Age:    62 years    BP:           96/81 mmHg Patient Gender: M           HR:           143 bpm. Exam Location:  Inpatient Procedure: 2D Echo, Color Doppler and Cardiac Doppler (Both Spectral and Color            Flow Doppler were utilized during procedure). Indications:    Post TAVR Evaluation z95.2, I31.3 Pericardial effusion                 (noninflammatory)  History:        Patient has prior history of Echocardiogram examinations, most                 recent 10/20/2023. Risk Factors:Hypertension and Dyslipidemia.                 Aortic Valve: 26 mm Edwards Sapien prosthetic, stented (TAVR)                 valve is present in the aortic position. Procedure Date: 10/20/23.  Sonographer:    Sherline Distel Senior RDCS Referring Phys: [id number] Ardia Kraft  Sonographer Comments: Pedof deferred due to heart rate. IMPRESSIONS  1. Left ventricular ejection fraction, by estimation, is 50 to 55%. The left ventricle has low normal function. The left ventricle has no regional wall motion abnormalities. Left ventricular diastolic parameters are indeterminate.  2. Right ventricular systolic function is normal. The right ventricular size is normal.  3. Moderate pericardial effusion. The pericardial effusion is lateral to the left ventricle and posterior to the left  ventricle.  4. The mitral valve is normal in structure. No evidence of mitral valve regurgitation. No evidence of mitral stenosis.  5. The aortic valve has been repaired/replaced. Aortic valve regurgitation is not visualized. No aortic stenosis is present. There is a 26 mm Edwards Sapien prosthetic (TAVR) valve present in the aortic position. Procedure Date: 10/20/23. Aortic valve area, by VTI measures 2.82 cm. Aortic valve mean gradient measures 4.0 mmHg. Aortic valve Vmax measures 1.48 m/s.  6. The inferior vena cava is normal in size with greater than 50% respiratory variability, suggesting right atrial pressure of 3 mmHg. Conclusion(s)/Recommendation(s): TAVR valve stable. Since yesterday there is a new inferolateral and anterolateral wall motion abnormality with a moderate posterior pericardial effusion. The IVC is dilated but mital inflow pattern is normal. Concern for possible early cardiac tamponade. Discussed with treating team. FINDINGS  Left Ventricle: Left ventricular ejection fraction, by estimation, is 50 to 55%. The left ventricle has low normal function. The left ventricle has no regional wall motion abnormalities. The left ventricular internal cavity size was normal in size. There is no left ventricular hypertrophy. Left ventricular diastolic parameters are indeterminate.  LV Wall Scoring: The posterior wall  is akinetic. The basal anterolateral segment is hypokinetic. Right Ventricle: The right ventricular size is normal. No increase in right ventricular wall thickness. Right ventricular systolic function is normal. Left Atrium: Left atrial size was normal in size. Right Atrium: Right atrial size was normal in size. Pericardium: A moderately sized pericardial effusion is present. The pericardial effusion is lateral to the left ventricle and posterior to the left ventricle. Mitral Valve: The mitral valve is normal in structure. No evidence of mitral valve regurgitation. No evidence of mitral valve  stenosis. Tricuspid Valve: The tricuspid valve is normal in structure. Tricuspid valve regurgitation is trivial. No evidence of tricuspid stenosis. Aortic Valve: The aortic valve has been repaired/replaced. Aortic valve regurgitation is not visualized. No aortic stenosis is present. Aortic valve mean gradient measures 4.0 mmHg. Aortic valve peak gradient measures 8.8 mmHg. Aortic valve area, by VTI measures 2.82 cm. There is a 26 mm Edwards Sapien prosthetic, stented (TAVR) valve present in the aortic position. Procedure Date: 10/20/23. Pulmonic Valve: The pulmonic valve was normal in structure. Pulmonic valve regurgitation is not visualized. No evidence of pulmonic stenosis. Aorta: The aortic root is normal in size and structure. Venous: The inferior vena cava is normal in size with greater than 50% respiratory variability, suggesting right atrial pressure of 3 mmHg. IAS/Shunts: No atrial level shunt detected by color flow Doppler.  LEFT VENTRICLE PLAX 2D LVOT diam:     2.20 cm LV SV:         43 LV SV Index:   23 LVOT Area:     3.80 cm  RIGHT VENTRICLE RV S prime:     8.27 cm/s TAPSE (M-mode): 1.5 cm LEFT ATRIUM             Index        RIGHT ATRIUM           Index LA Vol (A2C):   23.4 ml 12.61 ml/m  RA Area:     16.00 cm LA Vol (A4C):   30.7 ml 16.54 ml/m  RA Volume:   35.70 ml  19.24 ml/m LA Biplane Vol: 27.9 ml 15.03 ml/m  AORTIC VALVE AV Area (Vmax):    2.09 cm AV Area (Vmean):   2.18 cm AV Area (VTI):     2.82 cm AV Vmax:           148.00 cm/s AV Vmean:          92.600 cm/s AV VTI:            0.151 m AV Peak Grad:      8.8 mmHg AV Mean Grad:      4.0 mmHg LVOT Vmax:         81.40 cm/s LVOT Vmean:        53.000 cm/s LVOT VTI:          0.112 m LVOT/AV VTI ratio: 0.74  AORTA Ao Asc diam: 3.60 cm  SHUNTS Systemic VTI:  0.11 m Systemic Diam: 2.20 cm Jules Oar MD Electronically signed by Jules Oar MD Signature Date/Time: 10/21/2023/4:45:26 PM    Final      Medications:     Scheduled  Medications:  sodium chloride    Intravenous Once   aspirin  EC  81 mg Oral q AM   Chlorhexidine  Gluconate Cloth  6 each Topical Daily   colchicine   0.6 mg Oral Daily   sodium chloride  flush  3 mL Intravenous Q12H   sodium chloride  flush  3 mL Intravenous Q12H  Infusions:  sodium chloride      sodium chloride      levofloxacin  (LEVAQUIN ) IV     vancomycin       PRN Medications: sodium chloride , sodium chloride , acetaminophen  **OR** acetaminophen , ALPRAZolam , methocarbamol , morphine  injection, ondansetron  (ZOFRAN ) IV, mouth rinse, sodium chloride  flush, sodium chloride  flush, traMADol     Assessment/Plan   1. Pericardial effusion: Post-TAVR noted to have moderate pericardial effusion primarily posterior to heart with some evidence for compression of left atrium from CTA chest 6/4.  ?Wire perforation at time of TAVR though pericardial effusion not seen during procedure.   By echo and RHC, there appears to be at least early tamponade and patient has developed sinus tachycardia with pleuritic and positional chest pain.  No good window for pericardiocentesis (reviewed with Dr. Abel Hoe).  This morning RA pressure 10 with PA 32/22 and preserved CO.  - Plan today for robotically-assisted left chest drainage today with Dr. Sherene Dilling.  - Continue colchicine  for pleuritic chest pain.  2.  Bicuspid aortic valve with low flow/low gradient severe AS: Now s/p TAVR.  Bioprosthetic valve functioning normally by echo.  - With bilateral carpal tunnel syndrome and peripheral neuropathy, should ultimately have workup for cardiac amyloidosis.  3. CAD: H/o CABG with LIMA-LAD and SVG-D.  LHC 03/25 with patent grafts, mild disease LCX, severe disease RCA (small). ST elevations noted on monitoring at time of TAVR deployment. Relook LHC at time of TAVR showed patent grafts and LCX okay.  Now with new lateral wall motion abnormality by echo.  Additional relook coronary angiography yesterday showed patent SVG-D, patent  LIMA-LAD, and nonobstructive disease in LCx.  Nothing to explain new lateral wall motion abnormality.   Interestingly, troponin was increased to 4392.  I do not have a good explanation at this point for elevated troponin and WMA unless there was wire perforation lateral wall leading to these findings.  - ASA - Intolerant of statins.  - Would not use heparin  gtt with the new pericardial effusion.   CRITICAL CARE Performed by: Peder Bourdon  Total critical care time: 35 minutes  Critical care time was exclusive of separately billable procedures and treating other patients.  Critical care was necessary to treat or prevent imminent or life-threatening deterioration.  Critical care was time spent personally by me on the following activities: development of treatment plan with patient and/or surrogate as well as nursing, discussions with consultants, evaluation of patient's response to treatment, examination of patient, obtaining history from patient or surrogate, ordering and performing treatments and interventions, ordering and review of laboratory studies, ordering and review of radiographic studies, pulse oximetry and re-evaluation of patient's condition.   Length of Stay: 2  Peder Bourdon, MD  10/22/2023, 10:06 AM  Advanced Heart Failure Team Pager [phone] (M-F; 7a - 5p)  Please contact [facility] for night-coverage after hours (5p -7a ) and weekends on [email or web address]

## 2023-10-22 NOTE — TOC Initial Note (Signed)
 Transition of Care [facility]) - Initial/Assessment Note    Patient Details  Name: Philip Winters MRN: [id number] Date of Birth: 28-Jan-1961  Transition of Care [facility]) CM/SW Contact:    Benjiman Bras, RN Phone Number: [phone] 10/22/2023, 5:30 PM   Clinical Narrative:                 TOC CM spoke to pt and wife at bedside. Pt states he was independent pta. States he has Rollator from previous surgery.  Will continue to follow for dc needs.   Expected Discharge Plan: Home/Self Care Barriers to Discharge: Continued Medical Work up   Patient Goals and CMS Choice Patient states their goals for this hospitalization and ongoing recovery are:: wants to remain independent          Expected Discharge Plan and Services   Discharge Planning Services: CM Consult   Living arrangements for the past 2 months: Single Family Home                                      Prior Living Arrangements/Services Living arrangements for the past 2 months: Single Family Home Lives with:: Spouse Patient language and need for interpreter reviewed:: Yes Do you feel safe going back to the place where you live?: Yes      Need for Family Participation in Patient Care: No (Comment) Care giver support system in place?: Yes (comment)   Criminal Activity/Legal Involvement Pertinent to Current Situation/Hospitalization: No - Comment as needed  Activities of Daily Living   ADL Screening (condition at time of admission) Independently performs ADLs?: Yes (appropriate for developmental age) Is the patient deaf or have difficulty hearing?: No Does the patient have difficulty seeing, even when wearing glasses/contacts?: No Does the patient have difficulty concentrating, remembering, or making decisions?: No  Permission Sought/Granted Permission sought to share information with : Case Manager, Family Supports, PCP Permission granted to share information with : Yes, Verbal Permission Granted  Share  Information with NAME: Jamus Loving  Permission granted to share info w AGENCY: PCP, Home Health, DME  Permission granted to share info w Relationship: wife  Permission granted to share info w Contact Information: [phone]  Emotional Assessment Appearance:: Appears stated age Attitude/Demeanor/Rapport: Engaged Affect (typically observed): Accepting Orientation: : Oriented to Self, Oriented to Place, Oriented to  Time, Oriented to Situation   Psych Involvement: No (comment)  Admission diagnosis:  Aortic stenosis, severe [I35.0] Aortic stenosis [I35.0] S/P TAVR (transcatheter aortic valve replacement) [Z95.2] Patient Active Problem List   Diagnosis Date Noted   Aortic stenosis 10/20/2023   S/P TAVR (transcatheter aortic valve replacement) 10/20/2023   PAD (peripheral artery disease) (HCC)    Scapholunate advanced collapse of left wrist 03/13/2023   Arthritis of carpometacarpal (CMC) joint of left thumb 03/13/2023   Numbness in feet 09/18/2022   Tachycardia 09/18/2022   Dyslipidemia, goal LDL below 70 09/18/2022   Idiopathic small fiber peripheral neuropathy 09/18/2022   Chronic hyperglycemia 09/18/2022   Lumbar spinal stenosis 04/15/2022   Chronic pain of left thumb 02/25/2022   SBE (subacute bacterial endocarditis) prophylaxis candidate 12/16/2021   Cervical radiculopathy 08/13/2021   Ulnar neuropathy 08/13/2021   Candidal balanitis 06/04/2021   Carpal tunnel syndrome, bilateral upper limbs 09/26/2020   Pulmonary nodule 1 cm or greater in diameter 04/26/2020   Statin intolerance 04/20/2020   Coronary atherosclerosis of native coronary artery 04/18/2020  Nonrheumatic aortic valve stenosis    Bicuspid aortic valve 04/06/2020   Primary osteoarthritis of left knee 11/08/2019   Bilateral primary osteoarthritis of knee 09/28/2019   HNP (herniated nucleus pulposus), lumbar 09/29/2017   Pain in left wrist 10/09/2016   Unilateral primary osteoarthritis, right knee 06/24/2016    Primary hypertension 01/18/2007   PCP:  [name], MD Pharmacy:   [id number] Jonette Nestle, [place] - [address] AT [facility] [address] [place] [address] Phone: [phone] Fax: [phone]     Social Drivers of Health (SDOH) Social History: SDOH Screenings   Food Insecurity: No Food Insecurity (10/20/2023)  Housing: Low Risk  (10/20/2023)  Transportation Needs: No Transportation Needs (10/21/2023)  Utilities: Not At Risk (10/21/2023)  Depression (PHQ2-9): Low Risk  (12/08/2022)  Tobacco Use: Medium Risk (10/22/2023)   SDOH Interventions:     Readmission Risk Interventions     No data to display

## 2023-10-22 NOTE — Progress Notes (Signed)
  Echocardiogram Echocardiogram Transesophageal has been performed.  Gopal Malter 10/22/2023, 5:25 PM

## 2023-10-22 NOTE — Progress Notes (Signed)
 1 Day Post-Op Procedure(s) (LRB): RIGHT HEART CATH (N/A) CORONARY ANGIOGRAPHY (N/A) Subjective: Still with pleuritic CP  Objective: Vital signs in last 24 hours: Temp:  [98.4 F (36.9 C)-99.5 F (37.5 C)] 98.4 F (36.9 C) (06/05 1030) Pulse Rate:  [0-140] 137 (06/05 1030) Cardiac Rhythm: Sinus tachycardia (06/05 0800) Resp:  [15-36] 15 (06/05 0800) BP: (95-145)/(68-121) 135/76 (06/05 1030) SpO2:  [89 %-98 %] 93 % (06/05 1030) Weight:  [71 kg] 71 kg (06/05 0500)  Hemodynamic parameters for last 24 hours: PAP: (20-47)/(11-24) 47/14 CVP:  [6 mmHg-21 mmHg] 15 mmHg CO:  [4.2 L/min-4.6 L/min] 4.6 L/min CI:  [2.3 L/min/m2-2.5 L/min/m2] 2.5 L/min/m2  Intake/Output from previous day: 06/04 0701 - 06/05 0700 In: 838.2 [P.O.:240; I.V.:598.2] Out: 425 [Urine:425] Intake/Output this shift: Total I/O In: -  Out: 200 [Urine:200]  General appearance: alert, cooperative, and no distress Neurologic: intact Heart: tachycardic Lungs: clear to auscultation bilaterally  Lab Results: Recent Labs    10/21/23 0156 10/21/23 1609 10/22/23 1013  WBC 12.1*  --  8.9  HGB 12.2* 11.6*  11.6* 11.6*  HCT 36.9* 34.0*  34.0* 33.4*  PLT 154  --  114*   BMET:  Recent Labs    10/21/23 0156 10/21/23 1609 10/22/23 1013  NA 134* 133*  133* 133*  K 4.3 4.4  4.4 4.1  CL 103  --  99  CO2 16*  --  27  GLUCOSE 148*  --  107*  BUN 18  --  10  CREATININE 0.70  --  0.61  CALCIUM  8.9  --  9.1    PT/INR: No results for input(s): "LABPROT", "INR" in the last 72 hours. ABG    Component Value Date/Time   PHART 7.350 05/01/2020 1931   HCO3 22.6 10/21/2023 1609   HCO3 23.1 10/21/2023 1609   TCO2 24 10/21/2023 1609   TCO2 24 10/21/2023 1609   ACIDBASEDEF 3.0 (H) 10/21/2023 1609   ACIDBASEDEF 2.0 10/21/2023 1609   O2SAT 49.7 10/22/2023 1038   CBG (last 3)  No results for input(s): "GLUCAP" in the last 72 hours.  Assessment/Plan: S/P Procedure(s) (LRB): RIGHT HEART CATH (N/A) CORONARY  ANGIOGRAPHY (N/A) Pericardial effusion- loculated posteriorly with evidence of LA compression D/w Dr. Sherene Dilling.  I agree that subxiphoid approach is not feasible. Best option is via left chest.  Discussed option for robotic assisted approach.  He is aware of risks and benefits and agrees to proceed    LOS: 2 days    Zelphia Higashi 10/22/2023

## 2023-10-22 NOTE — Brief Op Note (Signed)
 10/20/2023 - 10/22/2023  6:22 PM  PATIENT:  Philip Winters  62 y.o. male  PRE-OPERATIVE DIAGNOSIS:  PERICARDIAL EFFUSION  POST-OPERATIVE DIAGNOSIS:  PERICARDIAL EFFUSION  PROCEDURE:  CREATION, PERICARDIAL WINDOW, ROBOT-ASSISTED (Left) ECHOCARDIOGRAM, TRANSESOPHAGEAL   SURGEON:  Surgeons and Role:    * Zelphia Higashi, MD - Primary    * Bartle, Shellie Dials, MD - Assisting  PHYSICIAN ASSISTANT: Debroah Fanning PA-C  ASSISTANTS: none   ANESTHESIA:   local and general  EBL:  Minimal   BLOOD ADMINISTERED:none  DRAINS: pericardial drain   LOCAL MEDICATIONS USED:  OTHER Exparel   SPECIMEN:  No Specimen  DISPOSITION OF SPECIMEN:  N/A  COUNTS:  YES  DICTATION: .Dragon Dictation  PLAN OF CARE: Admit to inpatient   PATIENT DISPOSITION:  ICU - intubated and hemodynamically stable.   Delay start of Pharmacological VTE agent (>24hrs) due to surgical blood loss or risk of bleeding: yes

## 2023-10-22 NOTE — Progress Notes (Signed)
 1 Day Post-Op Procedure(s) (LRB): RIGHT HEART CATH (N/A) CORONARY ANGIOGRAPHY (N/A) Subjective:  Hemodynamics stable overnight. Sinus tachy 110. Says he slept overnight. Some chest pain with deep breaths only. No SOB.  Objective: Vital signs in last 24 hours: Temp:  [97.9 F (36.6 C)-99.5 F (37.5 C)] 99 F (37.2 C) (06/05 0600) Pulse Rate:  [0-149] 111 (06/05 0600) Cardiac Rhythm: Sinus tachycardia (06/05 0400) Resp:  [14-36] 33 (06/04 1712) BP: (96-145)/(68-111) 114/85 (06/05 0600) SpO2:  [87 %-98 %] 94 % (06/05 0600) Weight:  [71 kg] 71 kg (06/05 0500)  Hemodynamic parameters for last 24 hours: PAP: (20-31)/(11-21) 25/15 CVP:  [6 mmHg-14 mmHg] 10 mmHg CO:  [4.2 L/min] 4.2 L/min CI:  [2.3 L/min/m2] 2.3 L/min/m2  Intake/Output from previous day: 06/04 0701 - 06/05 0700 In: 838.2 [P.O.:240; I.V.:598.2] Out: 425 [Urine:425] Intake/Output this shift: No intake/output data recorded.  General appearance: alert and cooperative Neurologic: intact Heart: regular rate and rhythm Lungs: clear to auscultation bilaterally Extremities: no edema  Lab Results: Recent Labs    10/21/23 0156 10/21/23 1609  WBC 12.1*  --   HGB 12.2* 11.6*  11.6*  HCT 36.9* 34.0*  34.0*  PLT 154  --    BMET:  Recent Labs    10/20/23 1909 10/21/23 0156 10/21/23 1609  NA 138 134* 133*  133*  K 4.7 4.3 4.4  4.4  CL 104 103  --   CO2  --  16*  --   GLUCOSE 173* 148*  --   BUN 17 18  --   CREATININE 0.70 0.70  --   CALCIUM   --  8.9  --     PT/INR: No results for input(s): "LABPROT", "INR" in the last 72 hours. ABG    Component Value Date/Time   PHART 7.350 05/01/2020 1931   HCO3 22.6 10/21/2023 1609   HCO3 23.1 10/21/2023 1609   TCO2 24 10/21/2023 1609   TCO2 24 10/21/2023 1609   ACIDBASEDEF 3.0 (H) 10/21/2023 1609   ACIDBASEDEF 2.0 10/21/2023 1609   O2SAT 51 10/21/2023 1609   O2SAT 49 10/21/2023 1609   CBG (last 3)  No results for input(s): "GLUCAP" in the last 72  hours.  Assessment/Plan:  Moderate pericardial effusion with pre-tamponade hemodynamics after TAVR. Plan robotically assisted left chest drainage this afternoon. Pt is ready to proceed.   LOS: 2 days    Philip Winters 10/22/2023

## 2023-10-22 NOTE — Interval H&P Note (Signed)
 History and Physical Interval Note:  10/22/2023 3:04 PM  Philip Winters  has presented today for surgery, with the diagnosis of PERICARDIAL EFFUSION.  The various methods of treatment have been discussed with the patient and family. After consideration of risks, benefits and other options for treatment, the patient has consented to  Procedure(s): CREATION, PERICARDIAL WINDOW, ROBOT-ASSISTED (Left) CREATION, PERICARDIAL WINDOW, ANTERIOR THORACOTOMY APPROACH (Left) ECHOCARDIOGRAM, TRANSESOPHAGEAL (N/A) as a surgical intervention.  The patient's history has been reviewed, patient examined, no change in status, stable for surgery.  I have reviewed the patient's chart and labs.  Questions were answered to the patient's satisfaction.     Zelphia Higashi

## 2023-10-22 NOTE — Anesthesia Preprocedure Evaluation (Addendum)
 Anesthesia Evaluation  Patient identified by MRN, date of birth, ID band Patient awake    Reviewed: Allergy & Precautions, NPO status , Patient's Chart, lab work & pertinent test results  Airway Mallampati: III  TM Distance: <3 FB Neck ROM: Full  Mouth opening: Limited Mouth Opening  Dental  (+) Caps, Dental Advisory Given   Pulmonary former smoker    + decreased breath sounds      Cardiovascular hypertension, + CAD, + CABG and + Peripheral Vascular Disease   Rhythm:Regular Rate:Tachycardia  - AS s/p TAVR   Neuro/Psych  Neuromuscular disease  negative psych ROS   GI/Hepatic Neg liver ROS,GERD  ,,  Endo/Other  negative endocrine ROS    Renal/GU negative Renal ROS     Musculoskeletal  (+) Arthritis ,    Abdominal   Peds  Hematology negative hematology ROS (+)   Anesthesia Other Findings   Reproductive/Obstetrics                             Anesthesia Physical Anesthesia Plan  ASA: 4  Anesthesia Plan: General   Post-op Pain Management: Ofirmev  IV (intra-op)*   Induction: Intravenous  PONV Risk Score and Plan: 3 and Ondansetron  and Treatment may vary due to age or medical condition  Airway Management Planned: Oral ETT  Additional Equipment: TEE  Intra-op Plan:   Post-operative Plan: Possible Post-op intubation/ventilation  Informed Consent: I have reviewed the patients History and Physical, chart, labs and discussed the procedure including the risks, benefits and alternatives for the proposed anesthesia with the patient or authorized representative who has indicated his/her understanding and acceptance.       Plan Discussed with: CRNA  Anesthesia Plan Comments:        Anesthesia Quick Evaluation

## 2023-10-22 NOTE — Progress Notes (Signed)
 Patient ID: Philip Winters, male   DOB: 09-06-1960, 63 y.o.   MRN: [id number]   TCTS Evening Rounds:   Hemodynamically stable in sinus rhythm 110.    Awake and alert. Wants to eat dinner. Minimal incisional pain.  Urine output good   CT output 100 cc since surgery. Some bloody drainage around tube on the dressing. It was changed by nurse.  CBC    Component Value Date/Time   WBC 8.9 10/22/2023 1013   RBC 3.35 (L) 10/22/2023 1013   HGB 11.6 (L) 10/22/2023 1013   HGB 15.0 07/16/2023 1437   HCT 33.4 (L) 10/22/2023 1013   HCT 43.9 07/16/2023 1437   PLT 114 (L) 10/22/2023 1013   PLT 214 07/16/2023 1437   MCV 99.7 10/22/2023 1013   MCV 98 (H) 07/16/2023 1437   MCH 34.6 (H) 10/22/2023 1013   MCHC 34.7 10/22/2023 1013   RDW 11.9 10/22/2023 1013   RDW 12.3 07/16/2023 1437   LYMPHSABS 0.9 10/10/2021 1104   MONOABS 0.5 09/04/2021 0845   EOSABS 0.1 10/10/2021 1104   BASOSABS 0.1 10/10/2021 1104     BMET    Component Value Date/Time   NA 133 (L) 10/22/2023 1013   NA 139 07/16/2023 1437   K 4.1 10/22/2023 1013   CL 99 10/22/2023 1013   CO2 27 10/22/2023 1013   GLUCOSE 107 (H) 10/22/2023 1013   BUN 10 10/22/2023 1013   BUN 17 07/16/2023 1437   CREATININE 0.61 10/22/2023 1013   CALCIUM  9.1 10/22/2023 1013   EGFR 99 07/16/2023 1437   GFRNONAA >60 10/22/2023 1013     A/P:  Stable postop course. Continue current plans

## 2023-10-22 NOTE — Anesthesia Procedure Notes (Signed)
 Arterial Line Insertion Start/End6/09/2023 2:30 PM, 10/22/2023 2:40 AM Performed by: Micheal Agent, DO, Bennett Brass, CRNA, CRNA  Patient location: Pre-op. Preanesthetic checklist: patient identified, IV checked, site marked, risks and benefits discussed, surgical consent, monitors and equipment checked, pre-op evaluation, timeout performed and anesthesia consent Lidocaine  1% used for infiltration Right, radial was placed Catheter size: 20 G Hand hygiene performed  and maximum sterile barriers used   Attempts: 2 Procedure performed without using ultrasound guided technique. Following insertion, dressing applied. Post procedure assessment: normal and unchanged  Patient tolerated the procedure well with no immediate complications.

## 2023-10-22 NOTE — Anesthesia Procedure Notes (Signed)
 Procedure Name: Intubation Date/Time: 10/22/2023 4:55 PM  Performed by: Willian Harrow, MDPre-anesthesia Checklist: Patient identified, Emergency Drugs available, Suction available and Patient being monitored Patient Re-evaluated:Patient Re-evaluated prior to induction Oxygen Delivery Method: Circle system utilized Preoxygenation: Pre-oxygenation with 100% oxygen Induction Type: IV induction and Cricoid Pressure applied Ventilation: Mask ventilation without difficulty and Oral airway inserted - appropriate to patient size Laryngoscope Size: Mac and 4 Grade View: Grade II Tube type: Oral Endobronchial tube: Double lumen EBT, Left, EBT position confirmed by auscultation and EBT position confirmed by fiberoptic bronchoscope and 39 Fr Number of attempts: 3 Airway Equipment and Method: Stylet and Oral airway Placement Confirmation: ETT inserted through vocal cords under direct vision, positive ETCO2 and breath sounds checked- equal and bilateral Tube secured with: Tape Dental Injury: Teeth and Oropharynx as per pre-operative assessment  Comments: See record notes for additional information - issues with anesthesia machine. SLT placed and DLT exchanged via cook catheter

## 2023-10-22 NOTE — Op Note (Signed)
 NAMEJAZZ, ROGALA MEDICAL RECORD NO: [id number] ACCOUNT NO: [id number] DATE OF BIRTH: December 10, 1960 FACILITY: [facility] LOCATION: [facility] PHYSICIAN: Milon Aloe. Luna Salinas, MD  Operative Report   DATE OF PROCEDURE: 10/22/2023  PREOPERATIVE DIAGNOSIS: Loculated pericardial effusion with borderline tamponade.  POSTOPERATIVE DIAGNOSIS: Loculated pericardial effusion with borderline tamponade.  PROCEDURE: Xi robotic assisted left VATS pericardial window.  SURGEON: Milon Aloe. Luna Salinas, MD.  ASSISTANT: Debroah Fanning, PA.  Experience assistance was a service case due to surgical complexity Debroah Fanning assisted with port placement, robot docking and undocking, instrument exchange, suctioning, and wound closure.  ANESTHESIA: General.  FINDINGS: Small amount of blood in pleural space, thickened pericardium, approximately 50 mL of blood evacuated, residual clot overlying the left ventricle was left in place.  Improved hemodynamics post drainage.  CLINICAL NOTE: Mr. Monnin is a 63 year old gentleman who had recently undergone a TAVR procedure.  He had pleuritic chest pain postoperative day #1. An echocardiogram revealed a loculated posterior effusion. He was advised to undergo a robotic-assisted left VATS for pericardial window. The indications, risks, benefits, and alternatives were discussed in detail with the patient. He understood and accepted the risks and agreed to proceed.  OPERATIVE NOTE: Mr. Bloxham was brought to the operating room on 10/22/2023. He had induction of general anesthesia and was intubated. There was a technical problem with the anesthesia machine, which had to be changed out. He then was re-intubated with a double-lumen endotracheal tube. Intravenous antibiotics were administered. Sequential compression devices were in place on the calves throughout the procedure. He was placed in a right lateral decubitus position. The left chest was prepped and draped in the usual sterile fashion.  Single-lung ventilation of the right lung was initiated and was tolerated well throughout the procedure.   A time-out was performed. A solution containing liposomal bupivacaine  and 0.5% bupivacaine  was used for local at the incision sites. An incision was made in the 8th interspace in the midaxillary line. An 8-mm robotic port was inserted. The thoracoscope was advanced into the chest after confirming intrapleural placement. Carbon dioxide was insufflated per protocol. Two additional 8-mm robotic ports were placed in the 8th interspace anterior and posterior to the camera and then a 12-mm AirSeal port was  placed in the 10th interspace posterolaterally. There was a small bloody pleural effusion and some clot in the left pleural space.  There were some minor adhesions, which were taken down to allow exposure of the pericardium. The phrenic nerve was identified and cautery was not used in its vicinity. The pericardium was opened inferiorly and posteriorly and there was dark red blood, approximately 50 mL in all was evacuated.  His hemodynamics did improve post drainage.  There was clot overlying the ventricle. With discussion with Dr. Sherene Dilling, the decision was made to not disturb the clot, which could potentially lead to more bleeding. A large window was created and then a 19-French Blake drain was placed through the most anterior 8th interspace incision and placed into the pericardium. Dual-lung ventilation was resumed. The drain was secured at the chest with a #1 silk suture. The robotic ports were removed after undocking the robot. A sponge used during the procedure was removed prior to reinflating the lung. The remaining incisions were closed in standard fashion. Dermabond was applied. The drain will be placed to a Pleurovac on suction. The patient was placed back in the supine position. He was extubated in the operating room and taken back to the surgical intensive care unit in fair condition. All sponge,  needle, and instrument counts were correct at the end of the procedure.    SUJ D: 10/22/2023 6:17:19 pm T: 10/22/2023 11:46:00 pm  JOB: [id number]/ [id number]

## 2023-10-22 NOTE — Transfer of Care (Signed)
 Immediate Anesthesia Transfer of Care Note  Patient: Pasco Bond Endsley  Procedure(s) Performed: CREATION, PERICARDIAL WINDOW, ROBOT-ASSISTED (Left) CREATION, PERICARDIAL WINDOW, ANTERIOR THORACOTOMY APPROACH (Left) ECHOCARDIOGRAM, TRANSESOPHAGEAL  Patient Location: PACU  Anesthesia Type:General  Level of Consciousness: awake and confused  Airway & Oxygen Therapy: Patient Spontanous Breathing and Patient connected to nasal cannula oxygen  Post-op Assessment: Report given to RN and Post -op Vital signs reviewed and stable  Post vital signs: Reviewed and stable  Last Vitals:  Vitals Value Taken Time  BP 126/83 10/22/23 1845  Temp    Pulse 118 10/22/23 1856  Resp 28 10/22/23 1856  SpO2 89 % 10/22/23 1856  Vitals shown include unfiled device data.  Last Pain:  Vitals:   10/22/23 1222  TempSrc:   PainSc: 0-No pain      Patients Stated Pain Goal: 0 (10/22/23 0800)  Complications: No notable events documented.

## 2023-10-23 ENCOUNTER — Telehealth (HOSPITAL_COMMUNITY): Payer: Self-pay | Admitting: Pharmacy Technician

## 2023-10-23 ENCOUNTER — Inpatient Hospital Stay (HOSPITAL_COMMUNITY)

## 2023-10-23 ENCOUNTER — Other Ambulatory Visit (HOSPITAL_COMMUNITY): Payer: Self-pay

## 2023-10-23 ENCOUNTER — Encounter: Payer: Self-pay | Admitting: Orthopedic Surgery

## 2023-10-23 ENCOUNTER — Encounter (HOSPITAL_COMMUNITY): Payer: Self-pay | Admitting: Thoracic Surgery (Cardiothoracic Vascular Surgery)

## 2023-10-23 DIAGNOSIS — I3139 Other pericardial effusion (noninflammatory): Secondary | ICD-10-CM

## 2023-10-23 DIAGNOSIS — I309 Acute pericarditis, unspecified: Secondary | ICD-10-CM | POA: Diagnosis not present

## 2023-10-23 DIAGNOSIS — I350 Nonrheumatic aortic (valve) stenosis: Secondary | ICD-10-CM | POA: Diagnosis not present

## 2023-10-23 DIAGNOSIS — Z954 Presence of other heart-valve replacement: Secondary | ICD-10-CM | POA: Diagnosis not present

## 2023-10-23 LAB — CBC
HCT: 33.1 % — ABNORMAL LOW (ref 39.0–52.0)
Hemoglobin: 11 g/dL — ABNORMAL LOW (ref 13.0–17.0)
MCH: 33.3 pg (ref 26.0–34.0)
MCHC: 33.2 g/dL (ref 30.0–36.0)
MCV: 100.3 fL — ABNORMAL HIGH (ref 80.0–100.0)
Platelets: 109 10*3/uL — ABNORMAL LOW (ref 150–400)
RBC: 3.3 MIL/uL — ABNORMAL LOW (ref 4.22–5.81)
RDW: 11.9 % (ref 11.5–15.5)
WBC: 7.6 10*3/uL (ref 4.0–10.5)
nRBC: 0 % (ref 0.0–0.2)

## 2023-10-23 LAB — ECHOCARDIOGRAM LIMITED
AR max vel: 1.96 cm2
AV Area VTI: 1.98 cm2
AV Area mean vel: 1.88 cm2
AV Mean grad: 3 mmHg
AV Peak grad: 6.1 mmHg
Ao pk vel: 1.23 m/s
Area-P 1/2: 4.39 cm2
Height: 68 in
S' Lateral: 3.8 cm
Weight: 2560.86 [oz_av]

## 2023-10-23 LAB — ECHO INTRAOPERATIVE TEE
Height: 68 in
Weight: 2504.43 [oz_av]

## 2023-10-23 LAB — BASIC METABOLIC PANEL WITH GFR
Anion gap: 8 (ref 5–15)
BUN: 11 mg/dL (ref 8–23)
CO2: 25 mmol/L (ref 22–32)
Calcium: 8.5 mg/dL — ABNORMAL LOW (ref 8.9–10.3)
Chloride: 103 mmol/L (ref 98–111)
Creatinine, Ser: 0.74 mg/dL (ref 0.61–1.24)
GFR, Estimated: >60 mL/min (ref >60)
Glucose, Bld: 147 mg/dL — ABNORMAL HIGH (ref 70–99)
Potassium: 4.6 mmol/L (ref 3.5–5.1)
Sodium: 136 mmol/L (ref 135–145)

## 2023-10-23 LAB — MAGNESIUM: Magnesium: 2.1 mg/dL (ref 1.7–2.4)

## 2023-10-23 LAB — COOXEMETRY PANEL
Carboxyhemoglobin: 1.1 % (ref 0.5–1.5)
Methemoglobin: 0.9 % (ref 0.0–1.5)
O2 Saturation: 56.3 %
Total hemoglobin: 11.3 g/dL — ABNORMAL LOW (ref 12.0–16.0)

## 2023-10-23 MED ORDER — KETOROLAC TROMETHAMINE 15 MG/ML IJ SOLN
15.0000 mg | Freq: Four times a day (QID) | INTRAMUSCULAR | Status: AC
  Administered 2023-10-23 – 2023-10-26 (×12): 15 mg via INTRAVENOUS
  Filled 2023-10-23 (×12): qty 1

## 2023-10-23 MED ORDER — GUAIFENESIN ER 600 MG PO TB12
600.0000 mg | ORAL_TABLET | Freq: Two times a day (BID) | ORAL | Status: DC | PRN
  Administered 2023-10-23: 600 mg via ORAL
  Filled 2023-10-23: qty 1

## 2023-10-23 MED ORDER — IRBESARTAN 150 MG PO TABS
75.0000 mg | ORAL_TABLET | Freq: Every day | ORAL | Status: DC
  Administered 2023-10-23 – 2023-10-26 (×4): 75 mg via ORAL
  Filled 2023-10-23 (×4): qty 1

## 2023-10-23 MED ORDER — MAGNESIUM SULFATE 2 GM/50ML IV SOLN
2.0000 g | Freq: Once | INTRAVENOUS | Status: AC
  Administered 2023-10-23: 2 g via INTRAVENOUS
  Filled 2023-10-23: qty 50

## 2023-10-23 MED ORDER — FUROSEMIDE 10 MG/ML IJ SOLN
40.0000 mg | Freq: Once | INTRAMUSCULAR | Status: AC
  Administered 2023-10-23: 40 mg via INTRAVENOUS
  Filled 2023-10-23: qty 4

## 2023-10-23 MED ORDER — MORPHINE SULFATE (PF) 2 MG/ML IV SOLN
2.0000 mg | INTRAVENOUS | Status: DC | PRN
  Filled 2023-10-23 (×2): qty 1

## 2023-10-23 MED ORDER — DILTIAZEM HCL ER COATED BEADS 240 MG PO CP24: 240.0000 mg | ORAL_CAPSULE | Freq: Every day | ORAL | Status: DC

## 2023-10-23 NOTE — Anesthesia Postprocedure Evaluation (Signed)
 Anesthesia Post Note  Patient: Philip Winters  Procedure(s) Performed: CREATION, PERICARDIAL WINDOW, ROBOT-ASSISTED (Left) CREATION, PERICARDIAL WINDOW, ANTERIOR THORACOTOMY APPROACH (Left) ECHOCARDIOGRAM, TRANSESOPHAGEAL     Patient location during evaluation: PACU Anesthesia Type: General Level of consciousness: awake and alert Pain management: pain level controlled Vital Signs Assessment: post-procedure vital signs reviewed and stable Respiratory status: spontaneous breathing, nonlabored ventilation, respiratory function stable and patient connected to nasal cannula oxygen Cardiovascular status: blood pressure returned to baseline and stable Postop Assessment: no apparent nausea or vomiting Anesthetic complications: no   No notable events documented.          Zyria Fiscus D Minnie Legros

## 2023-10-23 NOTE — Telephone Encounter (Signed)
 Pharmacy Patient Advocate Encounter   Received notification from Inpatient Request that prior authorization for Ivabradine  HCl 5MG  tablets is required/requested.   Insurance verification completed.   The patient is insured through [facility] .   Per test claim: PA required; PA submitted to above mentioned insurance via CoverMyMeds Key/confirmation #/EOC [id number] Status is pending

## 2023-10-23 NOTE — Plan of Care (Signed)
  Problem: Clinical Measurements: Goal: Ability to maintain clinical measurements within normal limits will improve Outcome: Progressing Goal: Respiratory complications will improve Outcome: Progressing   Problem: Activity: Goal: Risk for activity intolerance will decrease Outcome: Progressing   Problem: Nutrition: Goal: Adequate nutrition will be maintained Outcome: Progressing

## 2023-10-23 NOTE — Hospital Course (Addendum)
 HPI: He was found to have a moderate pericardial effusion with pre tamponade hemodynamics after TAVR. Dr. Sherene Dilling discussed the need for drainage of pericardial effusion. It was discussed that a subxiphoid approach was not feasible and that robotic assisted would be best. Dr. Luna Salinas then discussed the potential risks, benefits, and complications of robotic assisted drainage of pericardial effusion. Patient agreed to proceed with surgery.  Hospital Course: Patient underwent a Xi robotic assisted left VATS pericardial window. Swan, a line, and foley were removed on post op day 1. He was on HFNC post op. Chest tubes were removed on post op day 2.  He developed Atrial Fibrillation and was treated with IV Amiodarone .  He converted to NSR and was transitioned to oral regimen on 6/9.  His central line was removed without difficulty. He was treated with colchicine  for pericardial chest pain.  He was transferred to the progressive care unit on 10/25/2023.  He is ambulating independently.  He is maintaining sinus tachycardia which is chronic for him.  His surgical incisions are healing without evidence of infection.  He is felt stable for discharge home today.

## 2023-10-23 NOTE — Telephone Encounter (Signed)
 Patient Product/process development scientist completed.    The patient is insured through [facility]. Patient has Medicare and is not eligible for a copay card, but may be able to apply for patient assistance or Medicare RX Payment Plan (Patient Must reach out to their plan, if eligible for payment plan), if available.    Ran test claim for ivabradine  5 mg and Prior Authorization Required   This test claim was processed through [employer]- copay amounts may vary at other pharmacies due to [employer], or as the patient moves through the different stages of their insurance plan.     Morgan Arab, CPHT Pharmacy Technician III Certified Patient Advocate [facility] Pharmacy Patient Advocate Team Direct Number: [phone]  Fax: [phone]

## 2023-10-23 NOTE — Progress Notes (Signed)
 1 Day Post-Op Procedure(s) (LRB): CREATION, PERICARDIAL WINDOW, ROBOT-ASSISTED (Left) CREATION, PERICARDIAL WINDOW, ANTERIOR THORACOTOMY APPROACH (Left) ECHOCARDIOGRAM, TRANSESOPHAGEAL (N/A) Subjective: Feels ok. Mild left chest wall discomfort.  Objective: Vital signs in last 24 hours: Temp:  [97.5 F (36.4 C)-100 F (37.8 C)] 97.9 F (36.6 C) (06/06 0530) Pulse Rate:  [92-138] 110 (06/06 0600) Cardiac Rhythm: Sinus tachycardia (06/06 0400) Resp:  [10-28] 23 (06/06 0600) BP: (89-139)/(70-121) 139/85 (06/06 0600) SpO2:  [90 %-100 %] 97 % (06/06 0600) Arterial Line BP: (100-166)/(46-77) 166/75 (06/06 0600) Weight:  [72.6 kg] 72.6 kg (06/06 0500)  Hemodynamic parameters for last 24 hours: PAP: (13-47)/(6-24) 34/6 CVP:  [3 mmHg-22 mmHg] 9 mmHg CO:  [4.3 L/min-4.6 L/min] 4.3 L/min CI:  [2.3 L/min/m2-2.5 L/min/m2] 2.3 L/min/m2  Intake/Output from previous day: 06/05 0701 - 06/06 0700 In: 1240 [P.O.:240; I.V.:800; IV Piggyback:200] Out: 650 [Urine:400; Blood:30; Chest Tube:220] Intake/Output this shift: No intake/output data recorded.  General appearance: alert and cooperative Neurologic: intact Heart: regular rate and rhythm, S1, S2 normal, no murmur or rub  Lungs: clear to auscultation bilaterally Extremities: no edema Wound: incisions ok  Lab Results: Recent Labs    10/22/23 1013 10/23/23 0525  WBC 8.9 7.6  HGB 11.6* 11.0*  HCT 33.4* 33.1*  PLT 114* 109*   BMET:  Recent Labs    10/22/23 1013 10/23/23 0525  NA 133* 136  K 4.1 4.6  CL 99 103  CO2 27 25  GLUCOSE 107* 147*  BUN 10 11  CREATININE 0.61 0.74  CALCIUM  9.1 8.5*    PT/INR: No results for input(s): "LABPROT", "INR" in the last 72 hours. ABG    Component Value Date/Time   PHART 7.350 05/01/2020 1931   HCO3 22.6 10/21/2023 1609   HCO3 23.1 10/21/2023 1609   TCO2 24 10/21/2023 1609   TCO2 24 10/21/2023 1609   ACIDBASEDEF 3.0 (H) 10/21/2023 1609   ACIDBASEDEF 2.0 10/21/2023 1609   O2SAT 56.3  10/23/2023 0525   CBG (last 3)  No results for input(s): "GLUCAP" in the last 72 hours.  CXR: ok   Assessment/Plan: S/P Procedure(s) (LRB): CREATION, PERICARDIAL WINDOW, ROBOT-ASSISTED (Left) CREATION, PERICARDIAL WINDOW, ANTERIOR THORACOTOMY APPROACH (Left) ECHOCARDIOGRAM, TRANSESOPHAGEAL (N/A)  Hemodynamically stable in sinus rhythm 99-110. Will resume previous cardizem .  CT output low but will keep tube in today and plan to remove tomorrow.  DC swan, arterial line and foley.  Continue IS, ambulation.   LOS: 3 days    Bartley Lightning 10/23/2023

## 2023-10-23 NOTE — Progress Notes (Signed)
 CARDIAC REHAB PHASE I     Post TAVR education including site care, restrictions, risk factors, heart healthy diet, exercise guidelines and CRP2 reviewed. All questions and concerns addressed. Will refer to [facility] for CRP2.   1010-1040 Ronny Colas, RN BSN 10/23/2023 10:40 AM

## 2023-10-23 NOTE — Progress Notes (Addendum)
 Patient ID: Philip Winters, male   DOB: 1961-02-03, 63 y.o.   MRN: [id number]     Advanced Heart Failure Rounding Note  Cardiologist: [name], MD  Chief Complaint: chest pain Subjective:    6/3: TAVR; post-op c/b significant pressor requirement 6/4: LHC showed patent SVG-D, patent LIMA-LAD, and nonobstructive disease in LCx.   6/5: Cardiac tamponade s/p pericardial window  Coox 56%, CO/CI (fick) 3.6/1.9. Off pressors 220cc from pericardial drain overnight. Weight up 3.5lbs from yesterday.  Remains on HFNC 6L  SWAN#s (personally obtained) CVP 10 PA 28/17 CI 2.3  Mild pain at drain site. No SOB.   Objective:    Weight Range: 72.6 kg Body mass index is 24.34 kg/m.   Vital Signs:   Temp:  [97.5 F (36.4 C)-100 F (37.8 C)] 97.9 F (36.6 C) (06/06 0530) Pulse Rate:  [92-138] 110 (06/06 0600) Resp:  [10-28] 23 (06/06 0600) BP: (89-139)/(70-121) 139/85 (06/06 0600) SpO2:  [90 %-100 %] 97 % (06/06 0600) Arterial Line BP: (100-166)/(46-77) 166/75 (06/06 0600) Weight:  [72.6 kg] 72.6 kg (06/06 0500) Last BM Date :  (pta)  Weight change: Filed Weights   10/21/23 0500 10/22/23 0500 10/23/23 0500  Weight: 72.3 kg 71 kg 72.6 kg   Intake/Output:  Intake/Output Summary (Last 24 hours) at 10/23/2023 0746 Last data filed at 10/23/2023 0600 Gross per 24 hour  Intake 1240 ml  Output 650 ml  Net 590 ml    Physical Exam    CVP 10 General: Well appearing. No distress on [place] Cardiac: S1 and S2 present. No murmurs or rub. Extremities: Warm and dry.  No peripheral edema.  Neuro: Alert and oriented x3. Affect pleasant. Moves all extremities without difficulty. Lines/Devices:  RIJ CVL + PAC  Telemetry   ST 100s (personally reviewed)  Labs    CBC Recent Labs    10/22/23 1013 10/23/23 0525  WBC 8.9 7.6  HGB 11.6* 11.0*  HCT 33.4* 33.1*  MCV 99.7 100.3*  PLT 114* 109*   Basic Metabolic Panel Recent Labs    78/29/56 0156 10/21/23 1609 10/22/23 1013 10/23/23 0525   NA 134*   < > 133* 136  K 4.3   < > 4.1 4.6  CL 103  --  99 103  CO2 16*  --  27 25  GLUCOSE 148*  --  107* 147*  BUN 18  --  10 11  CREATININE 0.70  --  0.61 0.74  CALCIUM  8.9  --  9.1 8.5*  MG 1.8  --   --   --    < > = values in this interval not displayed.   D-Dimer Recent Labs    10/21/23 0156  DDIMER 2.86*   Imaging   DG Chest Port 1 View Result Date: 10/22/2023 CLINICAL DATA:  [id number] Pericardial effusion [id number] EXAM: PORTABLE CHEST - 1 VIEW COMPARISON:  Oct 16, 2023 FINDINGS: Right IJ approach pulmonary artery catheter terminates in the right hilar region. Sternotomy wires. Lower lung volumes. Streaky bibasilar atelectasis. No focal airspace consolidation, pleural effusion, or pneumothorax. Mild enlargement of the cardiac silhouette, which can be seen in an underlying pericardial effusion. Endovascular aortic stent graft in place. IMPRESSION: 1. Mild enlargement of the cardiac silhouette, which can be seen with an underlying pericardial effusion. 2. Well-positioned right IJ approach pulmonary artery catheter, terminating in the right hilar region. No pneumothorax. Electronically Signed   By: Rance Burrows M.D.   On: 10/22/2023 19:24   Medications:    Scheduled Medications:  aspirin  EC  81 mg Oral q AM   Chlorhexidine  Gluconate Cloth  6 each Topical Daily   colchicine   0.6 mg Oral Daily   diltiazem   240 mg Oral Daily   ketorolac   15 mg Intravenous Q6H   pantoprazole   40 mg Oral Daily   sodium chloride  flush  3 mL Intravenous Q12H   Infusions:  PRN Medications: acetaminophen  **OR** acetaminophen , ALPRAZolam , guaiFENesin , methocarbamol , morphine  injection, ondansetron  (ZOFRAN ) IV, mouth rinse, sodium chloride  flush, traMADol   Assessment/Plan   1. Pericardial effusion: Post-TAVR noted to have moderate pericardial effusion primarily posterior to heart with some evidence for compression of left atrium from CTA chest 6/4.  ?Wire perforation at time of TAVR though  pericardial effusion not seen during procedure. By echo and RHC, there appears to be at least early tamponade and patient has developed sinus tachycardia with pleuritic and positional chest pain. No good window for pericardiocentesis (reviewed with Dr. Abel Hoe), underwent robot-assisted VATS/pericardial window with drain placement 6/5 with Dr. Luna Salinas. 220cc thin, serosang output overnight.. Today RA 10, PA 28/17, CI 2.3.  - Continue colchicine  0.6 mg daily for pleuritic chest pain.  2.  Bicuspid aortic valve with low flow/low gradient severe AS: Now s/p TAVR. Bioprosthetic valve functioning normally by echo.  - With bilateral carpal tunnel syndrome and peripheral neuropathy, should ultimately have workup for cardiac amyloidosis.  3. CAD: H/o CABG with LIMA-LAD and SVG-D.  LHC 3/25 with patent grafts, mild disease LCX, severe disease RCA (small). ST elevations noted on monitoring at time of TAVR deployment. Relook LHC at time of TAVR showed patent grafts and LCX okay.  Now with new lateral wall motion abnormality by echo, EF about 50%.  Additional relook coronary angiography 6/4 showed patent SVG-D, patent LIMA-LAD, and nonobstructive disease in LCx.  Nothing to explain new lateral wall motion abnormality.  Interestingly, troponin was increased to 4392.  We do not have a good explanation at this point for elevated troponin and WMA unless there was wire perforation lateral wall leading to these findings.  - continue ASA - Intolerant of statins.  - Would not use heparin  gtt with the new pericardial effusion.  4. HTN: hypertensive overnight. On olmesartan  at home; start irbesartan  75 mg daily while IP 5. ST: previously on cardizem  at home, will hold with recent tamponade and pericardial drain. Will do price check for ivabradine .  CRITICAL CARE Performed by: [place] Lee  Total critical care time: 14 minutes  Critical care time was exclusive of separately billable procedures and treating other  patients.  Critical care was necessary to treat or prevent imminent or life-threatening deterioration.  Critical care was time spent personally by me on the following activities: development of treatment plan with patient and/or surrogate as well as nursing, discussions with consultants, evaluation of patient's response to treatment, examination of patient, obtaining history from patient or surrogate, ordering and performing treatments and interventions, ordering and review of laboratory studies, ordering and review of radiographic studies, pulse oximetry and re-evaluation of patient's condition.  Length of Stay: 3  [place] Lee, NP  10/23/2023, 7:46 AM  Advanced Heart Failure Team Pager [phone] (M-F; 7a - 5p)  Please contact [facility] for night-coverage after hours (5p -7a ) and weekends on [email or web address]   Patient seen with NP, I formulated the plan and agree with the above note.   Robot-assisted left VATS/pericardial window yesterday.   Pain at chest tube site, otherwise no complaints.  HR 100s, sinus.  CVP 10-11, CI 2.3 by thermo  with co-ox 56%.    General: NAD Neck: No JVD, no thyromegaly or thyroid  nodule.  Lungs: Clear to auscultation bilaterally with normal respiratory effort. CV: Nondisplaced PMI.  Heart regular S1/S2, no S3/S4, no murmur or friction rub.  No peripheral edema.  Abdomen: Soft, nontender, no hepatosplenomegaly, no distention.  Skin: Intact without lesions or rashes.  Neurologic: Alert and oriented x 3.  Psych: Normal affect. Extremities: No clubbing or cyanosis.  HEENT: Normal.   Stable hemodynamically today s/p pericardial window.  Has drain in place.  Pain at drain site, getting toradol .   - Remove Swan.  - Follow renal function closely with toradol .  - Limited echo to reassess pericardial effusion.  - Would hold off on treatment of mild sinus tachycardia.   CVP 10-11, will give 1 dose of Lasix  40 mg IV.    Mobilize.   CRITICAL CARE Performed by:  Peder Bourdon  Total critical care time: 35 minutes  Critical care time was exclusive of separately billable procedures and treating other patients.  Critical care was necessary to treat or prevent imminent or life-threatening deterioration.  Critical care was time spent personally by me on the following activities: development of treatment plan with patient and/or surrogate as well as nursing, discussions with consultants, evaluation of patient's response to treatment, examination of patient, obtaining history from patient or surrogate, ordering and performing treatments and interventions, ordering and review of laboratory studies, ordering and review of radiographic studies, pulse oximetry and re-evaluation of patient's condition.  Peder Bourdon 10/23/2023 10:13 AM

## 2023-10-23 NOTE — Progress Notes (Signed)
      [address]       [place] [address]             [phone]      Up in chair Some incisional pain, pleuritic pain resolved  BP 139/85   Pulse (!) 110   Temp 97.9 F (36.6 C)   Resp (!) 23   Ht 5\' 8"  (1.727 m)   Wt 72.6 kg   SpO2 97%   BMI 24.34 kg/m  31/19 CI 2.3  6L HFNC  ~ 200 ml from drain  Looks great Keep drain today  Landon Pinion C. Luna Salinas, MD [facility] [phone]

## 2023-10-23 NOTE — Telephone Encounter (Signed)
Amy please advise.

## 2023-10-23 NOTE — Telephone Encounter (Signed)
 Pharmacy Patient Advocate Encounter  Received notification from [facility] that Prior Authorization for Ivabradine  HCl 5MG  tablets  has been DENIED.  Full denial letter will be uploaded to the media tab. See denial reason below. We denied coverage for this drug because Ivabradine  is not being prescribed for an FDA labeled or medically accepted use. A medically accepted use is approved by the FDA or supported by the [facility] Formulary Service Drug Information and the DRUGDEX Information System. In this case, Ivabradine  is not being prescribed in accordance with an FDA labeled use or use accepted by the Medicare approved drug compendia.  PA #/Case ID/Reference #: [id number]

## 2023-10-24 ENCOUNTER — Inpatient Hospital Stay (HOSPITAL_COMMUNITY)

## 2023-10-24 DIAGNOSIS — I350 Nonrheumatic aortic (valve) stenosis: Secondary | ICD-10-CM | POA: Diagnosis not present

## 2023-10-24 DIAGNOSIS — Z954 Presence of other heart-valve replacement: Secondary | ICD-10-CM | POA: Diagnosis not present

## 2023-10-24 DIAGNOSIS — Z952 Presence of prosthetic heart valve: Secondary | ICD-10-CM | POA: Diagnosis not present

## 2023-10-24 LAB — CBC
HCT: 28.5 % — ABNORMAL LOW (ref 39.0–52.0)
Hemoglobin: 9.8 g/dL — ABNORMAL LOW (ref 13.0–17.0)
MCH: 34.4 pg — ABNORMAL HIGH (ref 26.0–34.0)
MCHC: 34.4 g/dL (ref 30.0–36.0)
MCV: 100 fL (ref 80.0–100.0)
Platelets: 119 10*3/uL — ABNORMAL LOW (ref 150–400)
RBC: 2.85 MIL/uL — ABNORMAL LOW (ref 4.22–5.81)
RDW: 11.9 % (ref 11.5–15.5)
WBC: 7 10*3/uL (ref 4.0–10.5)
nRBC: 0 % (ref 0.0–0.2)

## 2023-10-24 LAB — BASIC METABOLIC PANEL WITH GFR
Anion gap: 5 (ref 5–15)
BUN: 18 mg/dL (ref 8–23)
CO2: 28 mmol/L (ref 22–32)
Calcium: 8.5 mg/dL — ABNORMAL LOW (ref 8.9–10.3)
Chloride: 101 mmol/L (ref 98–111)
Creatinine, Ser: 0.66 mg/dL (ref 0.61–1.24)
GFR, Estimated: >60 mL/min (ref >60)
Glucose, Bld: 104 mg/dL — ABNORMAL HIGH (ref 70–99)
Potassium: 3.8 mmol/L (ref 3.5–5.1)
Sodium: 134 mmol/L — ABNORMAL LOW (ref 135–145)

## 2023-10-24 MED ORDER — GUAIFENESIN 100 MG/5ML PO LIQD: 5.0000 mL | ORAL | Status: DC | PRN

## 2023-10-24 MED ORDER — AMIODARONE LOAD VIA INFUSION: 150.0000 mg | Freq: Once | INTRAVENOUS | Status: DC

## 2023-10-24 MED ORDER — POTASSIUM CHLORIDE CRYS ER 20 MEQ PO TBCR
40.0000 meq | EXTENDED_RELEASE_TABLET | Freq: Once | ORAL | Status: AC
  Administered 2023-10-24: 40 meq via ORAL
  Filled 2023-10-24: qty 2

## 2023-10-24 MED ORDER — AMIODARONE HCL IN DEXTROSE 360-4.14 MG/200ML-% IV SOLN
60.0000 mg/h | INTRAVENOUS | Status: AC
  Administered 2023-10-24 (×2): 60 mg/h via INTRAVENOUS

## 2023-10-24 MED ORDER — POLYETHYLENE GLYCOL 3350 17 G PO PACK
17.0000 g | PACK | Freq: Every day | ORAL | Status: DC
  Administered 2023-10-24: 17 g via ORAL
  Filled 2023-10-24: qty 1

## 2023-10-24 MED ORDER — AMIODARONE IV BOLUS ONLY 150 MG/100ML: 150.0000 mg | Freq: Once | INTRAVENOUS | Status: DC

## 2023-10-24 MED ORDER — POLYETHYLENE GLYCOL 3350 17 G PO PACK
17.0000 g | PACK | Freq: Two times a day (BID) | ORAL | Status: DC
  Administered 2023-10-24 – 2023-10-25 (×2): 17 g via ORAL
  Filled 2023-10-24 (×2): qty 1

## 2023-10-24 MED ORDER — AMIODARONE HCL IN DEXTROSE 360-4.14 MG/200ML-% IV SOLN
30.0000 mg/h | INTRAVENOUS | Status: DC
  Administered 2023-10-24 – 2023-10-26 (×3): 30 mg/h via INTRAVENOUS
  Filled 2023-10-24 (×3): qty 200
  Filled 2023-10-24: qty 400
  Filled 2023-10-24: qty 200

## 2023-10-24 MED ORDER — AMIODARONE LOAD VIA INFUSION
150.0000 mg | Freq: Once | INTRAVENOUS | Status: AC
  Administered 2023-10-24: 150 mg via INTRAVENOUS
  Filled 2023-10-24: qty 83.34

## 2023-10-24 MED ORDER — SENNOSIDES-DOCUSATE SODIUM 8.6-50 MG PO TABS
1.0000 | ORAL_TABLET | Freq: Two times a day (BID) | ORAL | Status: DC
  Administered 2023-10-24 – 2023-10-25 (×2): 1 via ORAL
  Filled 2023-10-24 (×2): qty 1

## 2023-10-24 MED ORDER — FUROSEMIDE 10 MG/ML IJ SOLN
40.0000 mg | Freq: Once | INTRAMUSCULAR | Status: AC
  Administered 2023-10-24: 40 mg via INTRAVENOUS
  Filled 2023-10-24: qty 4

## 2023-10-24 NOTE — Progress Notes (Signed)
 [address]       [place] [address]             [phone]                 2 Days Post-Op Procedure(s) (LRB): CREATION, PERICARDIAL WINDOW, ROBOT-ASSISTED (Left) CREATION, PERICARDIAL WINDOW, ANTERIOR THORACOTOMY APPROACH (Left) ECHOCARDIOGRAM, TRANSESOPHAGEAL (N/A)   Events: Ambulated well Went into atrial fibrillation _______________________________________________________________ Vitals: BP 110/82 (BP Location: Right Arm)   Pulse 100   Temp (!) 97.5 F (36.4 C) (Oral)   Resp 17   Ht 5\' 8"  (1.727 m)   Wt 70.5 kg   SpO2 96%   BMI 23.63 kg/m  Filed Weights   10/22/23 0500 10/23/23 0500 10/24/23 0500  Weight: 71 kg 72.6 kg 70.5 kg     - Neuro: alert NAD  - Cardiovascular: IRIR  Drips: none.      - Pulm: EWOB    ABG    Component Value Date/Time   PHART 7.350 05/01/2020 1931   PCO2ART 40.8 05/01/2020 1931   PO2ART 107 05/01/2020 1931   HCO3 22.6 10/21/2023 1609   HCO3 23.1 10/21/2023 1609   TCO2 24 10/21/2023 1609   TCO2 24 10/21/2023 1609   ACIDBASEDEF 3.0 (H) 10/21/2023 1609   ACIDBASEDEF 2.0 10/21/2023 1609   O2SAT 56.3 10/23/2023 0525    - Abd: ND - Extremity: warm  .Intake/Output      06/06 0701 06/07 0700 06/07 0701 06/08 0700   P.O. 240    I.V. (mL/kg)     IV Piggyback 50    Total Intake(mL/kg) 290 (4.1)    Urine (mL/kg/hr) 475 (0.3)    Stool 0    Blood     Chest Tube 90 50   Total Output 565 50   Net -275 -50        Urine Occurrence 1 x    Stool Occurrence 1 x       _______________________________________________________________ Labs:    Latest Ref Rng & Units 10/24/2023    4:30 AM 10/23/2023    5:25 AM 10/22/2023   10:13 AM  CBC  WBC 4.0 - 10.5 K/uL 7.0  7.6  8.9   Hemoglobin 13.0 - 17.0 g/dL 9.8  32.4  40.1   Hematocrit 39.0 - 52.0 % 28.5  33.1  33.4   Platelets 150 - 400 K/uL 119  109  114       Latest Ref Rng & Units 10/24/2023    4:30 AM 10/23/2023    5:25 AM 10/22/2023   10:13 AM  CMP   Glucose 70 - 99 mg/dL 027  253  664   BUN 8 - 23 mg/dL 18  11  10    Creatinine 0.61 - 1.24 mg/dL 4.03  4.74  2.59   Sodium 135 - 145 mmol/L 134  136  133   Potassium 3.5 - 5.1 mmol/L 3.8  4.6  4.1   Chloride 98 - 111 mmol/L 101  103  99   CO2 22 - 32 mmol/L 28  25  27    Calcium  8.9 - 10.3 mg/dL 8.5  8.5  9.1     CXR: stable  _______________________________________________________________  Assessment and Plan: POD 2 s/p robotic pericardial window and TAVR  Neuro: pain controlled CV: in a fib.  Will start amio Pulm: IS ambulation Renal: creat stabl GI: on diet Heme: stable ID: afebrile Endo: SSI Dispo: continue ICU care   Philip Winters 10/24/2023 9:02  AM

## 2023-10-24 NOTE — Plan of Care (Signed)

## 2023-10-24 NOTE — Progress Notes (Signed)
 Patient ID: Philip Winters, male   DOB: [birth year], 63 y.o.   MRN: [id number]     Advanced Heart Failure Rounding Note  Cardiologist: [name], MD  Chief Complaint: chest pain Subjective:    6/3: TAVR; post-op c/b significant pressor requirement 6/4: LHC showed patent SVG-D, patent LIMA-LAD, and nonobstructive disease in LCx.   6/5: Cardiac tamponade s/p pericardial window  Patient overall doing well. Some pain at the drain site. Walking the halls this morning and feeling well.  Discouraged about the setback. Went to atrial fibrillation with RVR this morning.  Objective:    Weight Range: 70.5 kg Body mass index is 23.63 kg/m.   Vital Signs:   Temp:  [97.5 F (36.4 C)-98.3 F (36.8 C)] 97.5 F (36.4 C) (06/07 1130) Pulse Rate:  [78-124] 94 (06/07 1400) Resp:  [10-28] 16 (06/07 1400) BP: (77-147)/(56-129) 77/66 (06/07 1400) SpO2:  [88 %-100 %] 98 % (06/07 1400) Weight:  [70.5 kg] 70.5 kg (06/07 0500) Last BM Date : 10/23/23  Weight change: Filed Weights   10/22/23 0500 10/23/23 0500 10/24/23 0500  Weight: 71 kg 72.6 kg 70.5 kg   Intake/Output:  Intake/Output Summary (Last 24 hours) at 10/24/2023 1514 Last data filed at 10/24/2023 1500 Gross per 24 hour  Intake 407.61 ml  Output 1975 ml  Net -1567.39 ml    Physical Exam    General: Well appearing. No distress on [place] Cardiac: Regular rate and rhythm, systolic murmur Extremities: Warm and dry.  No peripheral edema.  Neuro: Alert and oriented x3. Affect pleasant. Moves all extremities without difficulty.  Telemetry   Atrial fibrillation in the 120s Labs    CBC Recent Labs    10/23/23 0525 10/24/23 0430  WBC 7.6 7.0  HGB 11.0* 9.8*  HCT 33.1* 28.5*  MCV 100.3* 100.0  PLT 109* 119*   Basic Metabolic Panel Recent Labs    40/98/11 0525 10/24/23 0430  NA 136 134*  K 4.6 3.8  CL 103 101  CO2 25 28  GLUCOSE 147* 104*  BUN 11 18  CREATININE 0.74 0.66  CALCIUM  8.5* 8.5*  MG 2.1  --    D-Dimer No  results for input(s): "DDIMER" in the last 72 hours.   Medications:    Scheduled Medications:  aspirin  EC  81 mg Oral q AM   Chlorhexidine  Gluconate Cloth  6 each Topical Daily   colchicine   0.6 mg Oral Daily   irbesartan   75 mg Oral Daily   ketorolac   15 mg Intravenous Q6H   pantoprazole   40 mg Oral Daily   polyethylene glycol  17 g Oral Daily   sodium chloride  flush  3 mL Intravenous Q12H   Infusions:  amiodarone  60 mg/hr (10/24/23 1500)   Followed by   amiodarone      PRN Medications: acetaminophen  **OR** acetaminophen , ALPRAZolam , guaiFENesin , methocarbamol , morphine  injection, ondansetron  (ZOFRAN ) IV, mouth rinse, sodium chloride  flush, traMADol   Assessment/Plan   1. Pericardial effusion: Post-TAVR noted to have moderate pericardial effusion primarily posterior to heart with some evidence for compression of left atrium from CTA chest 6/4.   No good window for pericardiocentesis, underwent robot-assisted VATS/pericardial window with drain placement 6/5 with Dr. Luna Salinas. Drain output slowing, can hopefully be removed soon - Continue colchicine  0.6 mg daily for pleuritic chest pain.   2.  Bicuspid aortic valve with low flow/low gradient severe AS: Now s/p TAVR. Bioprosthetic valve functioning normally by echo.  - With bilateral carpal tunnel syndrome and peripheral neuropathy, should ultimately have workup  for cardiac amyloidosis.   3. CAD: H/o CABG with LIMA-LAD and SVG-D.  New lateral wall motion on echo, no coronary disease on multiple repeat angiograms.  - continue ASA - Intolerant of statins.  - Would not use heparin  gtt with the new pericardial effusion.   4. HTN:  -Continue irbesartan  75 mg daily while IP  Atrial fibrillation: Suspect at least partly due to drain placement, may be able to remove tomorrow and afib may improve. For now, continue on IV amiodarone . - IV amiodarone  - Hold on anticoagulation given recent bleed   Length of Stay: 4  Lauralee Poll, MD  10/24/2023, 3:14 PM  Advanced Heart Failure Team Pager [phone] (M-F; 7a - 5p)  Please contact [facility] for night-coverage after hours (5p -7a ) and weekends on [email or web address]

## 2023-10-25 DIAGNOSIS — Z954 Presence of other heart-valve replacement: Secondary | ICD-10-CM | POA: Diagnosis not present

## 2023-10-25 DIAGNOSIS — I350 Nonrheumatic aortic (valve) stenosis: Secondary | ICD-10-CM | POA: Diagnosis not present

## 2023-10-25 DIAGNOSIS — Z952 Presence of prosthetic heart valve: Secondary | ICD-10-CM | POA: Diagnosis not present

## 2023-10-25 LAB — BASIC METABOLIC PANEL WITH GFR
Anion gap: 5 (ref 5–15)
BUN: 20 mg/dL (ref 8–23)
CO2: 29 mmol/L (ref 22–32)
Calcium: 8.4 mg/dL — ABNORMAL LOW (ref 8.9–10.3)
Chloride: 98 mmol/L (ref 98–111)
Creatinine, Ser: 0.81 mg/dL (ref 0.61–1.24)
GFR, Estimated: >60 mL/min (ref >60)
Glucose, Bld: 97 mg/dL (ref 70–99)
Potassium: 3.8 mmol/L (ref 3.5–5.1)
Sodium: 132 mmol/L — ABNORMAL LOW (ref 135–145)

## 2023-10-25 LAB — CBC
HCT: 31.6 % — ABNORMAL LOW (ref 39.0–52.0)
Hemoglobin: 10.8 g/dL — ABNORMAL LOW (ref 13.0–17.0)
MCH: 34.3 pg — ABNORMAL HIGH (ref 26.0–34.0)
MCHC: 34.2 g/dL (ref 30.0–36.0)
MCV: 100.3 fL — ABNORMAL HIGH (ref 80.0–100.0)
Platelets: 146 10*3/uL — ABNORMAL LOW (ref 150–400)
RBC: 3.15 MIL/uL — ABNORMAL LOW (ref 4.22–5.81)
RDW: 12.1 % (ref 11.5–15.5)
WBC: 6.5 10*3/uL (ref 4.0–10.5)
nRBC: 0 % (ref 0.0–0.2)

## 2023-10-25 MED ORDER — SODIUM CHLORIDE 0.9% FLUSH: 3.0000 mL | INTRAVENOUS | Status: DC | PRN

## 2023-10-25 MED ORDER — ~~LOC~~ CARDIAC SURGERY, PATIENT & FAMILY EDUCATION: Freq: Once | Status: DC

## 2023-10-25 MED ORDER — SODIUM CHLORIDE 0.9 % IV SOLN: 250.0000 mL | INTRAVENOUS | Status: AC | PRN

## 2023-10-25 MED ORDER — SODIUM CHLORIDE 0.9% FLUSH
3.0000 mL | Freq: Two times a day (BID) | INTRAVENOUS | Status: DC
  Administered 2023-10-25 – 2023-10-26 (×2): 3 mL via INTRAVENOUS

## 2023-10-25 NOTE — Progress Notes (Signed)
 Patient ID: Philip Winters, male   DOB: 1960-09-21, 63 y.o.   MRN: [id number]     Advanced Heart Failure Rounding Note  Cardiologist: [name], MD  Chief Complaint: chest pain Subjective:    6/3: TAVR; post-op c/b significant pressor requirement 6/4: LHC showed patent SVG-D, patent LIMA-LAD, and nonobstructive disease in LCx.   6/5: Cardiac tamponade s/p pericardial window  Back in NSR this morning, feeling well, better than yesterday. Hopeful to get CT out today. Able to go to the floor, structural can follow.   Objective:    Weight Range: 70.7 kg Body mass index is 23.7 kg/m.   Vital Signs:   Temp:  [97.9 F (36.6 C)-98.3 F (36.8 C)] 98.1 F (36.7 C) (06/08 1130) Pulse Rate:  [76-159] 100 (06/08 1130) Resp:  [10-36] 21 (06/08 1100) BP: (80-130)/(61-80) 108/78 (06/08 1130) SpO2:  [66 %-100 %] 96 % (06/08 1130) Weight:  [70.7 kg] 70.7 kg (06/08 0500) Last BM Date : 10/24/23  Weight change: Filed Weights   10/23/23 0500 10/24/23 0500 10/25/23 0500  Weight: 72.6 kg 70.5 kg 70.7 kg   Intake/Output:  Intake/Output Summary (Last 24 hours) at 10/25/2023 1710 Last data filed at 10/25/2023 1000 Gross per 24 hour  Intake 282.94 ml  Output 750 ml  Net -467.06 ml    Physical Exam    General: Well appearing. No distress on [place] Cardiac: Regular rate and rhythm, systolic murmur Extremities: Warm and dry.  No peripheral edema.  Neuro: Alert and oriented x3. Affect pleasant. Moves all extremities without difficulty.  Telemetry   NSR in the 90s Labs    CBC Recent Labs    10/24/23 0430 10/25/23 0544  WBC 7.0 6.5  HGB 9.8* 10.8*  HCT 28.5* 31.6*  MCV 100.0 100.3*  PLT 119* 146*   Basic Metabolic Panel Recent Labs    60/63/01 0525 10/24/23 0430 10/25/23 0544  NA 136 134* 132*  K 4.6 3.8 3.8  CL 103 101 98  CO2 25 28 29   GLUCOSE 147* 104* 97  BUN 11 18 20   CREATININE 0.74 0.66 0.81  CALCIUM  8.5* 8.5* 8.4*  MG 2.1  --   --    D-Dimer No results for input(s):  "DDIMER" in the last 72 hours.   Medications:    Scheduled Medications:  aspirin  EC  81 mg Oral q AM   Chlorhexidine  Gluconate Cloth  6 each Topical Daily   colchicine   0.6 mg Oral Daily   [facility] Patient [facility] Family Education   Does not apply Once   irbesartan   75 mg Oral Daily   ketorolac   15 mg Intravenous Q6H   pantoprazole   40 mg Oral Daily   polyethylene glycol  17 g Oral BID   senna-docusate  1 tablet Oral BID   sodium chloride  flush  3 mL Intravenous Q12H   sodium chloride  flush  3 mL Intravenous Q12H   Infusions:  sodium chloride      amiodarone  30 mg/hr (10/25/23 1000)   PRN Medications: sodium chloride , acetaminophen  **OR** acetaminophen , ALPRAZolam , guaiFENesin , methocarbamol , morphine  injection, ondansetron  (ZOFRAN ) IV, mouth rinse, sodium chloride  flush, sodium chloride  flush, traMADol   Assessment/Plan   1. Pericardial effusion: Post-TAVR noted to have moderate pericardial effusion primarily posterior to heart with some evidence for compression of left atrium from CTA chest 6/4.   No good window for pericardiocentesis, underwent robot-assisted VATS/pericardial window with drain placement 6/5 with Dr. Luna Salinas. Minimal drain output - Continue colchicine  0.6 mg daily for pleuritic  chest pain.   2.  Bicuspid aortic valve with low flow/low gradient severe AS: Now s/p TAVR. Bioprosthetic valve functioning normally by echo.  - With bilateral carpal tunnel syndrome and peripheral neuropathy, should ultimately have workup for cardiac amyloidosis.   3. CAD: H/o CABG with LIMA-LAD and SVG-D.  New lateral wall motion on echo, no coronary disease on multiple repeat angiograms.  - continue ASA - Intolerant of statins.  - No anticoagulation currently   4. HTN: BP controlled today. -Continue irbesartan  75 mg daily while IP  Atrial fibrillation: Suspect at least partly due to drain placement, converted on IV amiodarone . Would continue drip for now, hopefully  stop prior to discharge. - IV amiodarone  - Hold on anticoagulation given recent bleed   Length of Stay: 5  Lauralee Poll, MD  10/25/2023, 5:10 PM  Advanced Heart Failure Team Pager [phone] (M-F; 7a - 5p)  Please contact [facility] for night-coverage after hours (5p -7a ) and weekends on [email or web address]

## 2023-10-25 NOTE — Progress Notes (Signed)
      [address]       [place] [address]             [phone]                 3 Days Post-Op Procedure(s) (LRB): CREATION, PERICARDIAL WINDOW, ROBOT-ASSISTED (Left) CREATION, PERICARDIAL WINDOW, ANTERIOR THORACOTOMY APPROACH (Left) ECHOCARDIOGRAM, TRANSESOPHAGEAL (N/A)   Events: Ambulated well Back in sinus _______________________________________________________________ Vitals: BP 130/80   Pulse 90   Temp 97.9 F (36.6 C) (Oral)   Resp (!) 23   Ht 5\' 8"  (1.727 m)   Wt 70.7 kg   SpO2 99%   BMI 23.70 kg/m  Filed Weights   10/23/23 0500 10/24/23 0500 10/25/23 0500  Weight: 72.6 kg 70.5 kg 70.7 kg     - Neuro: alert NAD  - Cardiovascular: sinus  Drips: amio 30    - Pulm: EWOB    ABG    Component Value Date/Time   PHART 7.350 05/01/2020 1931   PCO2ART 40.8 05/01/2020 1931   PO2ART 107 05/01/2020 1931   HCO3 22.6 10/21/2023 1609   HCO3 23.1 10/21/2023 1609   TCO2 24 10/21/2023 1609   TCO2 24 10/21/2023 1609   ACIDBASEDEF 3.0 (H) 10/21/2023 1609   ACIDBASEDEF 2.0 10/21/2023 1609   O2SAT 56.3 10/23/2023 0525    - Abd: ND - Extremity: warm  .Intake/Output      06/07 0701 06/08 0700 06/08 0701 06/09 0700   P.O.     I.V. (mL/kg) 249.4 (3.5) 249.7 (3.5)   IV Piggyback     Total Intake(mL/kg) 249.4 (3.5) 249.7 (3.5)   Urine (mL/kg/hr) 2000 (1.2)    Stool 0    Chest Tube 270 10   Total Output 2270 10   Net -2020.6 +239.7        Stool Occurrence 1 x       _______________________________________________________________ Labs:    Latest Ref Rng & Units 10/25/2023    5:44 AM 10/24/2023    4:30 AM 10/23/2023    5:25 AM  CBC  WBC 4.0 - 10.5 K/uL 6.5  7.0  7.6   Hemoglobin 13.0 - 17.0 g/dL 91.4  9.8  78.2   Hematocrit 39.0 - 52.0 % 31.6  28.5  33.1   Platelets 150 - 400 K/uL 146  119  109       Latest Ref Rng & Units 10/25/2023    5:44 AM 10/24/2023    4:30 AM 10/23/2023    5:25 AM  CMP  Glucose 70 - 99 mg/dL 97  956  213   BUN  8 - 23 mg/dL 20  18  11    Creatinine 0.61 - 1.24 mg/dL 0.86  5.78  4.69   Sodium 135 - 145 mmol/L 132  134  136   Potassium 3.5 - 5.1 mmol/L 3.8  3.8  4.6   Chloride 98 - 111 mmol/L 98  101  103   CO2 22 - 32 mmol/L 29  28  25    Calcium  8.9 - 10.3 mg/dL 8.4  8.5  8.5     CXR: stable  _______________________________________________________________  Assessment and Plan: POD 3 s/p robotic pericardial window and TAVR  Neuro: pain controlled CV: in a fib.  Will keep amio gtt for now Pulm: IS ambulation Renal: creat stabl GI: on diet Heme: stable ID: [email or web address] Endo: SSI Dispo: floor   Shalik Sanfilippo O Kavya Haag 10/25/2023 10:03 AM

## 2023-10-26 ENCOUNTER — Other Ambulatory Visit (HOSPITAL_COMMUNITY): Payer: Self-pay

## 2023-10-26 ENCOUNTER — Ambulatory Visit: Admitting: Physician Assistant

## 2023-10-26 DIAGNOSIS — I350 Nonrheumatic aortic (valve) stenosis: Secondary | ICD-10-CM | POA: Diagnosis not present

## 2023-10-26 DIAGNOSIS — Z954 Presence of other heart-valve replacement: Secondary | ICD-10-CM | POA: Diagnosis not present

## 2023-10-26 DIAGNOSIS — I3139 Other pericardial effusion (noninflammatory): Secondary | ICD-10-CM | POA: Insufficient documentation

## 2023-10-26 LAB — BASIC METABOLIC PANEL WITH GFR
Anion gap: 9 (ref 5–15)
BUN: 13 mg/dL (ref 8–23)
CO2: 26 mmol/L (ref 22–32)
Calcium: 8.7 mg/dL — ABNORMAL LOW (ref 8.9–10.3)
Chloride: 101 mmol/L (ref 98–111)
Creatinine, Ser: 0.84 mg/dL (ref 0.61–1.24)
GFR, Estimated: >60 mL/min (ref >60)
Glucose, Bld: 105 mg/dL — ABNORMAL HIGH (ref 70–99)
Potassium: 3.6 mmol/L (ref 3.5–5.1)
Sodium: 136 mmol/L (ref 135–145)

## 2023-10-26 LAB — CBC
HCT: 31.5 % — ABNORMAL LOW (ref 39.0–52.0)
Hemoglobin: 10.9 g/dL — ABNORMAL LOW (ref 13.0–17.0)
MCH: 33.5 pg (ref 26.0–34.0)
MCHC: 34.6 g/dL (ref 30.0–36.0)
MCV: 96.9 fL (ref 80.0–100.0)
Platelets: 165 10*3/uL (ref 150–400)
RBC: 3.25 MIL/uL — ABNORMAL LOW (ref 4.22–5.81)
RDW: 12.1 % (ref 11.5–15.5)
WBC: 5.9 10*3/uL (ref 4.0–10.5)
nRBC: 0.7 % — ABNORMAL HIGH (ref 0.0–0.2)

## 2023-10-26 MED ORDER — AMIODARONE HCL 200 MG PO TABS
400.0000 mg | ORAL_TABLET | Freq: Two times a day (BID) | ORAL | Status: DC
  Administered 2023-10-26: 400 mg via ORAL
  Filled 2023-10-26: qty 2

## 2023-10-26 MED ORDER — POTASSIUM CHLORIDE CRYS ER 20 MEQ PO TBCR
40.0000 meq | EXTENDED_RELEASE_TABLET | Freq: Once | ORAL | Status: AC
  Administered 2023-10-26: 40 meq via ORAL
  Filled 2023-10-26: qty 2

## 2023-10-26 MED ORDER — COLCHICINE 0.6 MG PO TABS
0.6000 mg | ORAL_TABLET | Freq: Every day | ORAL | 0 refills | Status: DC
  Filled 2023-10-26: qty 30, 30d supply, fill #0

## 2023-10-26 NOTE — Progress Notes (Signed)
 Right internal jugular introducer removed as per order vitals taken manual pressure applied for 5 minutes,pt tolerated the procedure well,vitals taken,pt advice to be in bedrest for 30 minutes

## 2023-10-26 NOTE — TOC Transition Note (Signed)
 Transition of Care [facility]) - Discharge Note   Patient Details  Name: Philip Winters MRN: [id number] Date of Birth: Apr 16, 1961  Transition of Care [facility]) CM/SW Contact:  Benjiman Bras, RN Phone Number: [phone] 10/26/2023, 1:17 PM   Clinical Narrative:     TOC CM spoke to pt and wife at bedside. No dc needs identified.  Wife will transport home.  Has RW and scale at home.   Final next level of care: Home/Self Care Barriers to Discharge: No Barriers Identified   Patient Goals and CMS Choice Patient states their goals for this hospitalization and ongoing recovery are:: wants to remain independent          Discharge Placement                       Discharge Plan and Services Additional resources added to the After Visit Summary for     Discharge Planning Services: CM Consult                                 Social Drivers of Health (SDOH) Interventions SDOH Screenings   Food Insecurity: No Food Insecurity (10/20/2023)  Housing: Low Risk  (10/20/2023)  Transportation Needs: No Transportation Needs (10/21/2023)  Utilities: Not At Risk (10/21/2023)  Depression (PHQ2-9): Low Risk  (12/08/2022)  Tobacco Use: Medium Risk (10/22/2023)     Readmission Risk Interventions     No data to display

## 2023-10-26 NOTE — Progress Notes (Addendum)
      [address]       [place] [address]             [phone]      4 Days Post-Op Procedure(s) (LRB): CREATION, PERICARDIAL WINDOW, ROBOT-ASSISTED (Left) CREATION, PERICARDIAL WINDOW, ANTERIOR THORACOTOMY APPROACH (Left) ECHOCARDIOGRAM, TRANSESOPHAGEAL (N/A)  Subjective:  Patient sitting up in chair.  Feels great, really wants to go home.  Objective: Vital signs in last 24 hours: Temp:  [97.9 F (36.6 C)-98.4 F (36.9 C)] 98.4 F (36.9 C) (06/09 0734) Pulse Rate:  [90-107] 107 (06/09 0734) Cardiac Rhythm: Sinus tachycardia (06/08 1905) Resp:  [16-24] 18 (06/09 0734) BP: (103-130)/(75-81) 128/78 (06/09 0734) SpO2:  [66 %-100 %] 94 % (06/09 0734) Weight:  [68.8 kg] 68.8 kg (06/09 0617)  Intake/Output from previous day: 06/08 0701 - 06/09 0700 In: 404.2 [I.V.:404.2] Out: 10 [Chest Tube:10]  General appearance: alert, cooperative, and no distress Heart: regular rate and rhythm Lungs: clear to auscultation bilaterally Abdomen: soft, non-tender; bowel sounds normal; no masses,  no organomegaly Extremities: extremities normal, atraumatic, no cyanosis or edema Wound: clean and dry  Lab Results: Recent Labs    10/25/23 0544 10/26/23 0425  WBC 6.5 5.9  HGB 10.8* 10.9*  HCT 31.6* 31.5*  PLT 146* 165   BMET:  Recent Labs    10/25/23 0544 10/26/23 0425  NA 132* 136  K 3.8 3.6  CL 98 101  CO2 29 26  GLUCOSE 97 105*  BUN 20 13  CREATININE 0.81 0.84  CALCIUM  8.4* 8.7*    PT/INR: No results for input(s): "LABPROT", "INR" in the last 72 hours. ABG    Component Value Date/Time   PHART 7.350 05/01/2020 1931   HCO3 22.6 10/21/2023 1609   HCO3 23.1 10/21/2023 1609   TCO2 24 10/21/2023 1609   TCO2 24 10/21/2023 1609   ACIDBASEDEF 3.0 (H) 10/21/2023 1609   ACIDBASEDEF 2.0 10/21/2023 1609   O2SAT 56.3 10/23/2023 0525   CBG (last 3)  No results for input(s): "GLUCAP" in the last 72 hours.  Assessment/Plan: S/P Procedure(s)  (LRB): CREATION, PERICARDIAL WINDOW, ROBOT-ASSISTED (Left) CREATION, PERICARDIAL WINDOW, ANTERIOR THORACOTOMY APPROACH (Left) ECHOCARDIOGRAM, TRANSESOPHAGEAL (N/A)  CV- Sinus Tach, previous A. Fib- will stop Amiodarone  gtt, start oral at 400 mg BID with planned taper, continue colchicine .. BP is stable on Avapro  Pulm- not requiring oxygen, pericardial drain is out.. continue IS Renal- creatinine remains stable, okay to continue Toradol  prn D/C Central line Dispo- patient doing well, maintaining NSR, will supplement K to get closer to 4... possibly for d/c later today vs tomorrow will await structural heart team recommendations   LOS: 6 days    Gates Kasal, PA-C 10/26/2023 Patient seen and examined, agree with above Home today  Landon Pinion C. Luna Salinas, MD [facility] [phone]

## 2023-10-27 ENCOUNTER — Telehealth: Payer: Self-pay | Admitting: Physician Assistant

## 2023-10-27 NOTE — Telephone Encounter (Signed)
  HEART AND VASCULAR CENTER   MULTIDISCIPLINARY HEART VALVE TEAM   Patient contacted regarding discharge from [facility] on 10/26/23  Patient understands to follow up with a structural heart provider on 10/29/23 at [address] [facility]. Patient understands discharge instructions? yes Patient understands medications and regimen? yes Patient understands to bring all medications to this visit? yes  Abagail Hoar PA-C  [facility]

## 2023-10-27 NOTE — Progress Notes (Unsigned)
 HEART AND VASCULAR CENTER   MULTIDISCIPLINARY HEART VALVE CLINIC                                     Cardiology Office Note:    Date:  10/29/2023   ID:  Philip Winters, DOB 1961-02-07, MRN [id number]  PCP:  [name], MD  [facility] Cardiologist:  [name], MD  [facility] Structural heart: [name], MD [facility] Electrophysiologist:  None   Referring MD: [name], MD   [facility] s/p TAVR   History of Present Illness:    Philip Winters is a 63 y.o. male with a hx of CAD s/p 2V CABG with SVG--> D1 & LIMA--> LAD in (2021 PVT), PAD by CTs, HTN, HLD, bilateral carpal tunnel, spinal stenosis, peripheral neuropathy, inappropriate sinus tachycardia, TAA, bicuspid aortic valve with pLFLG aortic stenosis s/p TAVR (10/20/23) c/b pericardial effusion s/p VATs/pericardial window with drain placement 10/22/23 who presents to clinic for follow up.   He developed progressive exertional shortness of breath. Echo 06/30/2023 showed EF 50% and severe LFLG AS with mean grad 26 mmHg, AVA 0.6 cm2, DVI 0.16, SVI 18. L/RHC 07/30/23 showed severe two-vessel coronary disease with 2/2 patent bypass grafts, 30% mLCx and 90% pRCA lesion (vessel noted to be small). TAVR CTs showed extensive atherosclerotic calcification involving the common femoral arteries bilaterally with near occlusion of the left common femoral artery with residual luminal diameter of approximately 1-2 mm. He was not felt to be a good candidate for redo sternotomy given multiple orthopedic surgeries and slow recoveries. S/p successful TAVR with a 26 mm Edwards Sapien 3 Ultra Resilia THV via the TF approach on 10/20/23. Post-TAVR noted to have moderate pericardial effusion primarily posterior to heart with some evidence for compression of left atrium from CTA chest 6/4. There was no good window for pericardiocentesis and he ultimately underwent robot-assisted VATS/pericardial window with drain placement 6/5 with Dr. Luna Salinas. Chest  tube was removed and he was discharged on 10/26/23. He did develop PAF that converted on IV amiodarone . Discharged on colchicine  0.6 mg daily for pleuritic chest pain.  Today the patient presents to clinic for follow up. Here with wife. Still has some pleurtiic chest pain. Has some right arm pain and lump where IV was during admission. Has some mild SOB that is improving. No No LE edema, orthopnea or PND. Some mild dizziness but no syncope. No blood in stool or urine. No palpitations.    Past Medical History:  Diagnosis Date   Allergy    Arthritis    CAD (coronary artery disease)    GERD (gastroesophageal reflux disease)    occ   High cholesterol    Hypertension    PAD (peripheral artery disease) (HCC)    Peripheral neuropathy    Right carotid bruit 06/10/2017   S/P TAVR (transcatheter aortic valve replacement) 10/21/2023   s/p TAVR with a 26 mm Edwards S3UR via the TF approach by Dr. Abel Hoe and Dr. Sherene Dilling   Sciatica    Seasonal allergies    Severe aortic stenosis    Sinus tachycardia    Spinal stenosis      Current Medications: Current Meds  Medication Sig   Alirocumab  (PRALUENT ) 75 MG/ML SOAJ Inject 1 mL (75 mg total) into the skin every 14 (fourteen) days.   aspirin  EC 81 MG tablet Take 1 tablet (81 mg total) by mouth in  the morning. Swallow whole.   azithromycin  (ZITHROMAX ) 500 MG tablet Take 1 tablet (500 mg total) by mouth as directed. Take one tablet 1 hour before any dental work including cleanings.   CARTIA  XT 240 MG 24 hr capsule Take 240 mg by mouth daily.   colchicine  0.6 MG tablet Take 1 tablet (0.6 mg total) by mouth daily.   diltiazem  (CARDIZEM  CD) 120 MG 24 hr capsule Take 2 capsules (240 mg total) by mouth daily.   doxycycline  (VIBRAMYCIN ) 100 MG capsule Take 2 tablets by mouth 1 hour prior to dental procedure.   fluticasone  (FLONASE ) 50 MCG/ACT nasal spray Place 2 sprays into both nostrils daily as needed for allergies.   hydrOXYzine  (ATARAX ) 50 MG tablet Take  50 mg by mouth at bedtime as needed (itching/sleep).   Multiple Vitamin (MULTIVITAMIN WITH MINERALS) TABS tablet Take 1 tablet by mouth daily.   olmesartan  (BENICAR ) 5 MG tablet Take 1 tablet (5 mg total) by mouth daily.   Polyethyl Glycol-Propyl Glycol (LUBRICANT EYE DROPS) 0.4-0.3 % SOLN Place 1-2 drops into both eyes 3 (three) times daily as needed (dry/irritated eyes).   traMADol  (ULTRAM ) 50 MG tablet Take 50 mg by mouth every 6 (six) hours as needed for moderate pain (pain score 4-6).   [DISCONTINUED] olmesartan  (BENICAR ) 5 MG tablet Take 2 tablets (10 mg total) by mouth in the morning.      ROS:   Please see the history of present illness.    All other systems reviewed and are negative.  EKGs       Risk Assessment/Calculations:    CHA2DS2-VASc Score = 3   This indicates a 3.2% annual risk of stroke. The patient's score is based upon: CHF History: 1 HTN History: 1 Diabetes History: 0 Stroke History: 0 Vascular Disease History: 1 Age Score: 0 Gender Score: 0          Physical Exam:    VS:  BP 98/60 (BP Location: Right Arm, Patient Position: Sitting, Cuff Size: Normal)   Pulse 98   Ht 5' 8 (1.727 m)   Wt 150 lb (68 kg)   SpO2 96%   BMI 22.81 kg/m     Wt Readings from Last 3 Encounters:  10/29/23 150 lb (68 kg)  10/26/23 151 lb 10.8 oz (68.8 kg)  09/07/23 150 lb (68 kg)     GEN: Well nourished, well developed in no acute distress NECK: No JVD CARDIAC: RRR, 2/6 flow murmur @ RUSB. No rubs, gallops. Chest tube incision healing well.  RESPIRATORY:  Clear to auscultation without rales, wheezing or rhonchi  ABDOMEN: Soft, non-tender, non-distended EXTREMITIES:  No edema; No deformity.  Groin sites clear without hematoma. Ecchymosis bilaterally and on pubis. Right arm with quarter sized lump c/w phlebitis.  ASSESSMENT:    1. S/P TAVR (transcatheter aortic valve replacement)   2. S/P pericardial window creation   3. PAF (paroxysmal atrial fibrillation) (HCC)    4. Atherosclerosis of native coronary artery of native heart without angina pectoris   5. Primary hypertension   6. PAD (peripheral artery disease) (HCC)   7. Thoracic aortic aneurysm without rupture, unspecified part (HCC)   8. Inappropriate sinus tachycardia (HCC)   9. At risk for amyloid heart disease (HCC)   10. Bilateral carpal tunnel syndrome   11. Phlebitis     PLAN:    In order of problems listed above:  Severe AS s/p TAVR:  -- Pt doing okay s/p TAVR.   -- Groin sites healing well.  --  SBE prophylaxis discussed; I have RX'd azithromycin  due to a PCN allergy.   -- Continue Aspirin  81mg  daily. -- I will see back for 1 month echo and OV.  Pericardial effusion:  -- Post-TAVR noted to have moderate pericardial effusion primarily posterior to heart with some evidence for compression of left atrium from CTA chest 6/4.    -- No good window for pericardiocentesis, underwent robot-assisted VATS/pericardial window with drain placement 6/5 with Dr. Luna Salinas.  -- Continue colchicine  0.6 mg daily x 1 month for pleuritic chest pain. -- Follow up with Dr. Luna Salinas on 6/25.   PAF:  -- Suspect at least partly due to drain placement, converted on IV amiodarone .  -- No OAC given post op relation and recent bleed. -- Continue Cardizem  XT 240mg  daily.   CAD: -- CAD s/p 2V CABG with SVG--> D1 & LIMA--> LAD in (2021 PVT). -- [facility] 07/30/23 showed severe two-vessel coronary disease with 2/2 patent bypass grafts, 30% mLCx and 90% pRCA lesion (vessel noted to be small).  -- Continue aspirin  81mg  daily. -- Statin intolerant.  -- Continue Praulent 75mg  q 14 days.   HTN:  -- BP on low side today and having some dizziness. -- Decrease olmesartan  from 10mg  to 5mg  daily. -- Continue Cardizem  XT 240mg  daily.   PAD: -- Noted on pre TAVR CTs. -- Continue medical therapy.    TAA: -- 4.3cm.  -- Continue annal imaging.    Sinus tachycardia: -- He has a long history of sinus tachycardia  with HRs in low 100s.  -- He has not tolerated beta blockers in the past.  -- Continue long acting Cardizem       Increased risk for amyloid heart disease: -- With bilateral carpal tunnel, spinal stenosis, peripheral neuropathy and severe LFLG AS.  -- Discussed with pt today and will order SPEP/UPEP and PYP scan.   Phlebitis: -- Right arm swelling and erythema at IV site.  -- Supportive care for now.  -- He will call back if this worsens     Cardiac Rehabilitation Eligibility Assessment  The patient is ready to start cardiac rehabilitation from a cardiac standpoint.     Medication Adjustments/Labs and Tests Ordered: Current medicines are reviewed at length with the patient today.  Concerns regarding medicines are outlined above.  Orders Placed This Encounter  Procedures   SPEP   UPEP   PYP Scan   ECHOCARDIOGRAM COMPLETE   Meds ordered this encounter  Medications   azithromycin  (ZITHROMAX ) 500 MG tablet    Sig: Take 1 tablet (500 mg total) by mouth as directed. Take one tablet 1 hour before any dental work including cleanings.    Dispense:  6 tablet    Refill:  12    Supervising Provider:   COOPER, MICHAEL [[id number]]   olmesartan  (BENICAR ) 5 MG tablet    Sig: Take 1 tablet (5 mg total) by mouth daily.    Dispense:  90 tablet    Refill:  3    Patient Instructions  Medication Instructions:  Your physician has recommended you make the following change in your medication:  Start Azithromycin  500 mg, take 1 tablet by mouth 1 hour prior to dental procedures and cleanings.  2.  DECREASE OLMESARTAN  TO 1 TABLET DAILY  *If you need a refill on your cardiac medications before your next appointment, please call your pharmacy*  Lab Work: TODAY: SPEP, UPEP  If you have labs (blood work) drawn today and your tests are completely normal, you will receive your  results only by: MyChart Message (if you have MyChart) OR A paper copy in the mail If you have any lab test that is abnormal  or we need to change your treatment, we will call you to review the results.  Testing/Procedures: Your physician has requested that you have an echocardiogram. Echocardiography is a painless test that uses sound waves to create images of your heart. It provides your doctor with information about the size and shape of your heart and how well your heart's chambers and valves are working. This procedure takes approximately one hour. There are no restrictions for this procedure. Please do NOT wear cologne, perfume, aftershave, or lotions (deodorant is allowed). Please arrive 15 minutes prior to your appointment time.  Please note: We ask at that you not bring children with you during ultrasound (echo/ vascular) testing. Due to room size and safety concerns, children are not allowed in the ultrasound rooms during exams. Our front office staff cannot provide observation of children in our lobby area while testing is being conducted. An adult accompanying a patient to their appointment will only be allowed in the ultrasound room at the discretion of the ultrasound technician under special circumstances. We apologize for any inconvenience.   YOUR PROVIDER RECOMMENDS THAT YOU HAVE A PYP SCAN DONE.    Follow-Up: At [facility], you and your health needs are our priority.  As part of our continuing mission to provide you with exceptional heart care, our providers are all part of one team.  This team includes your primary Cardiologist (physician) and Advanced Practice Providers or APPs (Physician Assistants and Nurse Practitioners) who all work together to provide you with the care you need, when you need it.  Your next appointment:   KEEP SCHEDULED FOLLOW-UP  We recommend signing up for the patient portal called MyChart.  Sign up information is provided on this After Visit Summary.  MyChart is used to connect with patients for Virtual Visits (Telemedicine).  Patients are able to view lab/test  results, encounter notes, upcoming appointments, etc.  Non-urgent messages can be sent to your provider as well.   To learn more about what you can do with MyChart, go to [email or web address].     Signed, Abagail Hoar, PA-C  10/29/2023 3:21 PM    [facility]

## 2023-10-27 NOTE — Telephone Encounter (Signed)
 Called patient.  He is doing well. He said that he gets a little short of breath after walking for about 5 min.  Resting briefly relieves the SOB.  Coughing some w using incentive spirometer - sputum that is dark/blood tinged.  Holding incentive spirometer at about 1200, using every 1-2 hrs.  Feels he's doing well.  His wife didn't want anything to be missed so she wrote in.  He will let us  know if he has any concerns.  TOC is 10/29/23.

## 2023-10-28 LAB — BPAM RBC
Blood Product Expiration Date: 202506172359
Blood Product Expiration Date: 202506262359
ISSUE DATE / TIME: 202506051519
ISSUE DATE / TIME: 202506051519
Unit Type and Rh: 600
Unit Type and Rh: 600

## 2023-10-28 LAB — TYPE AND SCREEN
ABO/RH(D): A NEG
Antibody Screen: NEGATIVE
Unit division: 0
Unit division: 0

## 2023-10-29 ENCOUNTER — Encounter: Payer: Self-pay | Admitting: Physician Assistant

## 2023-10-29 ENCOUNTER — Ambulatory Visit: Attending: Physician Assistant | Admitting: Physician Assistant

## 2023-10-29 ENCOUNTER — Other Ambulatory Visit: Payer: Self-pay | Admitting: Physician Assistant

## 2023-10-29 VITALS — BP 98/60 | HR 98 | Ht 68.0 in | Wt 150.0 lb

## 2023-10-29 DIAGNOSIS — I809 Phlebitis and thrombophlebitis of unspecified site: Secondary | ICD-10-CM

## 2023-10-29 DIAGNOSIS — Z952 Presence of prosthetic heart valve: Secondary | ICD-10-CM

## 2023-10-29 DIAGNOSIS — I2510 Atherosclerotic heart disease of native coronary artery without angina pectoris: Secondary | ICD-10-CM

## 2023-10-29 DIAGNOSIS — I712 Thoracic aortic aneurysm, without rupture, unspecified: Secondary | ICD-10-CM

## 2023-10-29 DIAGNOSIS — E854 Organ-limited amyloidosis: Secondary | ICD-10-CM

## 2023-10-29 DIAGNOSIS — G5603 Carpal tunnel syndrome, bilateral upper limbs: Secondary | ICD-10-CM

## 2023-10-29 DIAGNOSIS — Z9889 Other specified postprocedural states: Secondary | ICD-10-CM | POA: Diagnosis not present

## 2023-10-29 DIAGNOSIS — I43 Cardiomyopathy in diseases classified elsewhere: Secondary | ICD-10-CM

## 2023-10-29 DIAGNOSIS — I350 Nonrheumatic aortic (valve) stenosis: Secondary | ICD-10-CM

## 2023-10-29 DIAGNOSIS — I480 Paroxysmal atrial fibrillation: Secondary | ICD-10-CM | POA: Diagnosis not present

## 2023-10-29 DIAGNOSIS — I739 Peripheral vascular disease, unspecified: Secondary | ICD-10-CM

## 2023-10-29 DIAGNOSIS — I10 Essential (primary) hypertension: Secondary | ICD-10-CM

## 2023-10-29 DIAGNOSIS — I4711 Inappropriate sinus tachycardia, so stated: Secondary | ICD-10-CM

## 2023-10-29 MED ORDER — OLMESARTAN MEDOXOMIL 5 MG PO TABS: 5.0000 mg | ORAL_TABLET | Freq: Every day | ORAL | 3 refills | Status: DC

## 2023-10-29 MED ORDER — AZITHROMYCIN 500 MG PO TABS
500.0000 mg | ORAL_TABLET | ORAL | 12 refills | Status: AC
Start: 2023-10-29 — End: ?

## 2023-10-29 NOTE — Patient Instructions (Addendum)
 Medication Instructions:  Your physician has recommended you make the following change in your medication:  Start Azithromycin  500 mg, take 1 tablet by mouth 1 hour prior to dental procedures and cleanings.  2.  DECREASE OLMESARTAN  TO 1 TABLET DAILY  *If you need a refill on your cardiac medications before your next appointment, please call your pharmacy*  Lab Work: TODAY: SPEP, UPEP  If you have labs (blood work) drawn today and your tests are completely normal, you will receive your results only by: MyChart Message (if you have MyChart) OR A paper copy in the mail If you have any lab test that is abnormal or we need to change your treatment, we will call you to review the results.  Testing/Procedures: Your physician has requested that you have an echocardiogram. Echocardiography is a painless test that uses sound waves to create images of your heart. It provides your doctor with information about the size and shape of your heart and how well your heart's chambers and valves are working. This procedure takes approximately one hour. There are no restrictions for this procedure. Please do NOT wear cologne, perfume, aftershave, or lotions (deodorant is allowed). Please arrive 15 minutes prior to your appointment time.  Please note: We ask at that you not bring children with you during ultrasound (echo/ vascular) testing. Due to room size and safety concerns, children are not allowed in the ultrasound rooms during exams. Our front office staff cannot provide observation of children in our lobby area while testing is being conducted. An adult accompanying a patient to their appointment will only be allowed in the ultrasound room at the discretion of the ultrasound technician under special circumstances. We apologize for any inconvenience.   YOUR PROVIDER RECOMMENDS THAT YOU HAVE A PYP SCAN DONE.    Follow-Up: At [facility], you and your health needs are our priority.  As part of our  continuing mission to provide you with exceptional heart care, our providers are all part of one team.  This team includes your primary Cardiologist (physician) and Advanced Practice Providers or APPs (Physician Assistants and Nurse Practitioners) who all work together to provide you with the care you need, when you need it.  Your next appointment:   KEEP SCHEDULED FOLLOW-UP  We recommend signing up for the patient portal called MyChart.  Sign up information is provided on this After Visit Summary.  MyChart is used to connect with patients for Virtual Visits (Telemedicine).  Patients are able to view lab/test results, encounter notes, upcoming appointments, etc.  Non-urgent messages can be sent to your provider as well.   To learn more about what you can do with MyChart, go to [email or web address].

## 2023-11-06 ENCOUNTER — Other Ambulatory Visit: Payer: Self-pay | Admitting: Thoracic Surgery (Cardiothoracic Vascular Surgery)

## 2023-11-06 ENCOUNTER — Ambulatory Visit

## 2023-11-06 VITALS — BP 110/70 | HR 99 | Ht 66.0 in | Wt 149.2 lb

## 2023-11-06 DIAGNOSIS — Z0000 Encounter for general adult medical examination without abnormal findings: Secondary | ICD-10-CM | POA: Diagnosis not present

## 2023-11-06 DIAGNOSIS — I350 Nonrheumatic aortic (valve) stenosis: Secondary | ICD-10-CM

## 2023-11-06 NOTE — Progress Notes (Signed)
 Subjective:   Philip Winters is a 63 y.o. who presents for a Medicare Wellness preventive visit.  As a reminder, Annual Wellness Visits don't include a physical exam, and some assessments may be limited, especially if this visit is performed virtually. We may recommend an in-person follow-up visit with your provider if needed.  Visit Complete: In person  Persons Participating in Visit: Patient.  AWV Questionnaire: No: Patient Medicare AWV questionnaire was not completed prior to this visit.  Cardiac Risk Factors include: advanced age (>16men, >63 women);hypertension;male gender;Other (see comment);dyslipidemia, Risk factor comments: PAD     Objective:    Today's Vitals   11/06/23 0919 11/06/23 0927  Weight:  149 lb 3.2 oz (67.7 kg)  Height:  5' 6 (1.676 m)  PainSc: 5     Body mass index is 24.08 kg/m.     11/06/2023    9:37 AM 10/20/2023    1:40 PM 07/30/2023    9:12 AM 04/20/2023    2:52 PM 03/13/2023    6:21 AM 11/05/2022    9:21 AM 06/18/2022    9:38 AM  Advanced Directives  Does Patient Have a Medical Advance Directive? No No Yes No No Yes No  Type of Best boy of [place];Living will   Healthcare Power of [place];Living will   Copy of Healthcare Power of Attorney in Chart?      No - copy requested   Would patient like information on creating a medical advance directive?  No - Patient declined  No - Patient declined No - Patient declined      Current Medications (verified) Outpatient Encounter Medications as of 11/06/2023  Medication Sig   acetaminophen  (TYLENOL ) 500 MG tablet Take 500-1,000 mg by mouth every 6 (six) hours as needed for moderate pain (pain score 4-6).   Alirocumab  (PRALUENT ) 75 MG/ML SOAJ Inject 1 mL (75 mg total) into the skin every 14 (fourteen) days.   aspirin  EC 81 MG tablet Take 1 tablet (81 mg total) by mouth in the morning. Swallow whole.   colchicine  0.6 MG tablet Take 1 tablet (0.6 mg total) by mouth daily.    diltiazem  (CARDIZEM  CD) 120 MG 24 hr capsule Take 2 capsules (240 mg total) by mouth daily.   doxycycline  (VIBRAMYCIN ) 100 MG capsule Take 2 tablets by mouth 1 hour prior to dental procedure.   fluticasone  (FLONASE ) 50 MCG/ACT nasal spray Place 2 sprays into both nostrils daily as needed for allergies.   methocarbamol  (ROBAXIN ) 500 MG tablet Take 500 mg by mouth every 8 (eight) hours as needed for muscle spasms.   Multiple Vitamin (MULTIVITAMIN WITH MINERALS) TABS tablet Take 1 tablet by mouth daily.   olmesartan  (BENICAR ) 5 MG tablet Take 1 tablet (5 mg total) by mouth daily.   Polyethyl Glycol-Propyl Glycol (LUBRICANT EYE DROPS) 0.4-0.3 % SOLN Place 1-2 drops into both eyes 3 (three) times daily as needed (dry/irritated eyes).   traMADol  (ULTRAM ) 50 MG tablet Take 50 mg by mouth every 6 (six) hours as needed for moderate pain (pain score 4-6).   azithromycin  (ZITHROMAX ) 500 MG tablet Take 1 tablet (500 mg total) by mouth as directed. Take one tablet 1 hour before any dental work including cleanings.   CARTIA  XT 240 MG 24 hr capsule Take 240 mg by mouth daily.   hydrOXYzine  (ATARAX ) 50 MG tablet Take 50 mg by mouth at bedtime as needed (itching/sleep). (Patient not taking: Reported on 11/06/2023)   No facility-administered encounter medications on file  as of 11/06/2023.    Allergies (verified) Mushroom extract complex (obsolete), Penicillins, Chantix [varenicline], Crestor [rosuvastatin], Dilaudid  [hydromorphone ], Lipitor [atorvastatin], Losartan potassium, Lovaza [omega-3-acid ethyl esters (fish)], Oxycodone  hcl, Pravachol [pravastatin], Bystolic [nebivolol hcl], Codeine, Lopressor  [metoprolol ], Red dye #40 (allura red), and Repatha  [evolocumab ]   History: Past Medical History:  Diagnosis Date   Allergy    Arthritis    CAD (coronary artery disease)    GERD (gastroesophageal reflux disease)    occ   High cholesterol    Hypertension    PAD (peripheral artery disease) (HCC)    Peripheral  neuropathy    Right carotid bruit 06/10/2017   S/P TAVR (transcatheter aortic valve replacement) 10/21/2023   s/p TAVR with a 26 mm Edwards S3UR via the TF approach by Dr. Abel Hoe and Dr. Sherene Dilling   Sciatica    Seasonal allergies    Severe aortic stenosis    Sinus tachycardia    Spinal stenosis    Past Surgical History:  Procedure Laterality Date   CARDIAC CATHETERIZATION     CARPAL TUNNEL WITH CUBITAL TUNNEL Left 11/10/2013   Procedure: LEFT CARPAL TUNNEL RELEASE;  Surgeon: Amelie Baize, MD;  Location: [facility];  Service: Orthopedics;  Laterality: Left;   CARPECTOMY Left 03/13/2023   Procedure: LEFT WRIST PROXIMAL ROW CARPECTOMY WITH PLACEMENT ALLOGRAFT SPACER / THUMB [facility] DENERVATION;  Surgeon: Merrill Abide, MD;  Location:  SURGERY CENTER;  Service: Orthopedics;  Laterality: Left;   COLONOSCOPY  2023   CORONARY ANGIOGRAPHY N/A 10/21/2023   Procedure: CORONARY ANGIOGRAPHY;  Surgeon: Odie Benne, MD;  [facility];  Service: Cardiovascular;  Laterality: N/A;   CORONARY ARTERY BYPASS GRAFT N/A 05/01/2020   Procedure: CORONARY ARTERY BYPASS GRAFTING (CABG) X 2 ON CARDIOPULMONARY BYPASS. LIMA TO LAD, SVG TO DIAG;  Surgeon: Heriberto London, MD;  Location: [facility] OR;  Service: Open Heart Surgery;  Laterality: N/A;   CORONARY/GRAFT ANGIOGRAPHY N/A 07/30/2023   Procedure: CORONARY/GRAFT ANGIOGRAPHY;  Surgeon: Odie Benne, MD;  [facility];  Service: Cardiovascular;  Laterality: N/A;   CORONARY/GRAFT ANGIOGRAPHY N/A 10/20/2023   Procedure: CORONARY ANGIOGRAPHY;  Surgeon: Odie Benne, MD;  [facility];  Service: Cardiovascular;  Laterality: N/A;   ENDOVEIN HARVEST OF GREATER SAPHENOUS VEIN Right 05/01/2020   Procedure: ENDOVEIN HARVEST OF GREATER SAPHENOUS VEIN;  Surgeon: Heriberto London, MD;  Location: [facility] OR;  Service: Open Heart Surgery;  Laterality: Right;   INTRAOPERATIVE  TRANSTHORACIC ECHOCARDIOGRAM N/A 10/20/2023   Procedure: ECHOCARDIOGRAM, TRANSTHORACIC;  Surgeon: Odie Benne, MD;  [facility];  Service: Cardiovascular;  Laterality: N/A;   KNEE ARTHROSCOPY  [id number]   left and right   LUMBAR LAMINECTOMY/DECOMPRESSION MICRODISCECTOMY Left 09/29/2017   Procedure: LEFT LUMBAR TWO - LUMBAR THREELAMINOTOMY/MICRODISCECTOMY;  Surgeon: Yvonna Herder, MD;  Location: [facility] OR;  Service: Neurosurgery;  Laterality: Left;  LEFT LUMBAR 2- LUMBAR 3 LAMINOTOMY/MICRODISCECTOMY   NASAL SINUS SURGERY  05/2015   RIGHT HEART CATH N/A 10/21/2023   Procedure: RIGHT HEART CATH;  Surgeon: Darlis Eisenmenger, MD;  Location: [facility];  Service: Cardiovascular;  Laterality: N/A;   RIGHT/LEFT HEART CATH AND CORONARY ANGIOGRAPHY N/A 04/10/2020   Procedure: RIGHT/LEFT HEART CATH AND CORONARY ANGIOGRAPHY;  Surgeon: Millicent Ally, MD;  [facility];  Service: Cardiovascular;  Laterality: N/A;   TEE WITHOUT CARDIOVERSION N/A 05/01/2020   Procedure: TRANSESOPHAGEAL ECHOCARDIOGRAM (TEE);  Surgeon: Matt Song, Donata Fryer, MD;  Location[facility];  Service: Open Heart Surgery;  Laterality: N/A;   TEE WITHOUT CARDIOVERSION N/A 10/22/2023   Procedure: ECHOCARDIOGRAM, TRANSESOPHAGEAL;  Surgeon: Zelphia Higashi, MD;  Location: [facility] OR;  Service: Thoracic;  Laterality: N/A;   TOTAL KNEE ARTHROPLASTY Right 06/24/2016   Procedure: TOTAL KNEE ARTHROPLASTY;  Surgeon: Shirlee Dotter, MD;  Location[facility];  Service: Orthopedics;  Laterality: Right;   TOTAL KNEE ARTHROPLASTY Left 12/18/2020   Procedure: LEFT TOTAL KNEE ARTHROPLASTY;  Surgeon: Shirlee Dotter, MD;  Location: [facility];  Service: Orthopedics;  Laterality: Left;   TRANSFORAMINAL LUMBAR INTERBODY FUSION W/ MIS 2 LEVEL Left 04/15/2022   Procedure: Minimally Invasive Surgery Decompression and Transforaminal Lumbar Interbody Fusion , Left , Lumbar three-four, Lumbar four-five;  Surgeon: Dawley, Colby Daub, DO;  Location: [facility];  Service: Neurosurgery;  Laterality: Left;   TRIGGER FINGER RELEASE Left 11/10/2013   Procedure: LEFT INDEX A-1 PULLEY RELEASE;  Surgeon: Amelie Baize, MD;  Location: [facility];  Service: Orthopedics;  Laterality: Left;   ULNAR NERVE TRANSPOSITION Left 11/10/2013   Procedure: ULNAR NERVE TRANSPOSITION;  Surgeon: Amelie Baize, MD;  Location:  SURGERY CENTER;  Service: Orthopedics;  Laterality: Left;   XI ROBOTIC ASSISTED PERICARDIAL WINDOW Left 10/22/2023   Procedure: CREATION, PERICARDIAL WINDOW, ROBOT-ASSISTED;  Surgeon: Zelphia Higashi, MD;  Location: [facility];  Service: Thoracic;  Laterality: Left;   Family History  Problem Relation Age of Onset   Hypertension Mother    Throat cancer Mother    COPD Mother    Hypertension Father    Cancer Father        Mesothelioma   Colon cancer Neg Hx    Esophageal cancer Neg Hx    Stomach cancer Neg Hx    Rectal cancer Neg Hx    Colon polyps Neg Hx    Social History   Socioeconomic History   Marital status: Married    Spouse name: Trevor Fudge   Number of children: 0   Years of education: 12   Highest education level: Not on file  Occupational History   Occupation: Self employed    Comment: Nutritional therapist   Occupation: Retired Nutritional therapist  Tobacco Use   Smoking status: Former    Current packs/day: 0.00    Average packs/day: 1 pack/day for 37.0 years (37.0 ttl pk-yrs)    Types: Cigarettes    Start date: 05/05/1979    Quit date: 05/04/2016    Years since quitting: 7.5   Smokeless tobacco: Never  Vaping Use   Vaping status: Former  Substance and Sexual Activity   Alcohol  use: Yes    Alcohol /week: 5.0 standard drinks of alcohol     Types: 5 Glasses of wine per week    Comment: weekends only with dinner   Drug use: No   Sexual activity: Yes  Other Topics Concern   Not on file  Social History Narrative   Lives with wife, [name]   Caffeine use: 1 Coffee daily   Right  handed    Social Drivers of Corporate investment banker Strain: Low Risk  (11/06/2023)   Overall Financial Resource Strain (CARDIA)    Difficulty of Paying Living Expenses: Not hard at all  Food Insecurity: No Food Insecurity (11/06/2023)   Hunger Vital Sign    Worried About Running Out of Food in the Last Year: Never true    Ran Out of Food in the Last Year: Never true  Transportation Needs: No  Transportation Needs (11/06/2023)   PRAPARE - Administrator, Civil Service (Medical): No    Lack of Transportation (Non-Medical): No  Physical Activity: Inactive (11/06/2023)   Exercise Vital Sign    Days of Exercise per Week: 0 days    Minutes of Exercise per Session: 0 min  Stress: No Stress Concern Present (11/06/2023)   [employer] of Occupational Health - Occupational Stress Questionnaire    Feeling of Stress: Not at all  Social Connections: Moderately Isolated (11/06/2023)   Social Connection and Isolation Panel    Frequency of Communication with Friends and Family: Twice a week    Frequency of Social Gatherings with Friends and Family: Once a week    Attends Religious Services: Never    Database administrator or Organizations: No    Attends Engineer, structural: Never    Marital Status: Married    Tobacco Counseling Counseling given: Not Answered    Clinical Intake:  Pre-visit preparation completed: Yes  Pain Score: 5  Pain Type: Acute pain Pain Location: Chest Pain Orientation: Left (side (sore)) Pain Descriptors / Indicators: Aching, Discomfort, Sore (SOB) Pain Frequency: Constant     BMI - recorded: 24.08 Nutritional Status: BMI of 19-24  Normal Nutritional Risks: None Diabetes: No  Lab Results  Component Value Date   HGBA1C 5.6 09/18/2022   HGBA1C 5.7 (H) 04/17/2021   HGBA1C 5.8 (H) 04/30/2020     How often do you need to have someone help you when you read instructions, pamphlets, or other written materials from your doctor or  pharmacy?: 1 - Never  Interpreter Needed?: No  Information entered by :: Makeshia Seat, RMA   Activities of Daily Living     11/06/2023    9:20 AM 10/20/2023    1:39 PM  In your present state of health, do you have any difficulty performing the following activities:  Hearing? 0   Vision? 0   Difficulty concentrating or making decisions? 0   Walking or climbing stairs? 0   Dressing or bathing? 0   Doing errands, shopping? 0 0  Preparing Food and eating ? N   Using the Toilet? N   In the past six months, have you accidently leaked urine? N   Do you have problems with loss of bowel control? N   Managing your Medications? N   Managing your Finances? N   Housekeeping or managing your Housekeeping? N     Patient Care Team: [name], MD as PCP - General (Internal Medicine) Hugh Madura, MD as PCP - Cardiology (Cardiology) Odie Benne, MD as PCP - Structural Heart (Cardiology)  I have updated your Care Teams any recent Medical Services you may have received from other providers in the past year.     Assessment:   This is a routine wellness examination for [facility].  Hearing/Vision screen Hearing Screening - Comments:: Denies hearing difficulties   Vision Screening - Comments:: Wears eyeglasses/[employer]   Goals Addressed             This Visit's Progress    Patient Stated       Get back to the way he was before surgery in June/2025       Depression Screen     11/06/2023    9:39 AM 12/08/2022    3:02 PM 08/21/2022    2:20 PM 07/17/2022    3:59 PM 10/16/2021    2:36 PM 09/04/2021    8:10 AM  08/09/2020    4:42 PM  PHQ 2/9 Scores  PHQ - 2 Score 0 0 0 0 0 0 0  PHQ- 9 Score 3    8      Fall Risk     11/06/2023    9:37 AM 12/08/2022    3:02 PM 08/21/2022    2:20 PM 07/17/2022    3:59 PM 10/16/2021    2:35 PM  Fall Risk   Falls in the past year? 0 0 0 0 0  Number falls in past yr: 0 0 0 0 0  Injury with Fall? 0 0 0 0 0  Risk for fall due to :  No  Fall Risks No Fall Risks No Fall Risks   Follow up Falls evaluation completed;Falls prevention discussed Falls evaluation completed Falls evaluation completed Falls evaluation completed     MEDICARE RISK AT HOME:  Medicare Risk at Home Any stairs in or around the home?: No If so, are there any without handrails?: No Home free of loose throw rugs in walkways, pet beds, electrical cords, etc?: Yes Adequate lighting in your home to reduce risk of falls?: Yes Life alert?: No Use of a cane, walker or w/c?: No Grab bars in the bathroom?: No Shower chair or bench in shower?: No Elevated toilet seat or a handicapped toilet?: No  TIMED UP AND GO:  Was the test performed?  Yes  Length of time to ambulate 10 feet: 15 sec Gait steady and fast without use of assistive device  Cognitive Function: Declined/Normal: No cognitive concerns noted by patient or family. Patient alert, oriented, able to answer questions appropriately and recall recent events. No signs of memory loss or confusion.        Immunizations Immunization History  Administered Date(s) Administered   Influenza Split 02/20/2012   Influenza,inj,Quad PF,6+ Mos 01/25/2013   Influenza-Unspecified 02/20/2022   Tdap 09/18/2016    Screening Tests Health Maintenance  Topic Date Due   Pneumococcal Vaccine 61-63 Years old (1 of 2 - PCV) Never done   Zoster Vaccines- Shingrix (1 of 2) Never done   COVID-19 Vaccine (1 - 2024-25 season) Never done   INFLUENZA VACCINE  12/18/2023   Lung Cancer Screening  10/20/2024   Medicare Annual Wellness (AWV)  11/05/2024   Colonoscopy  08/01/2026   DTaP/Tdap/Td (2 - Td or Tdap) 09/19/2026   Hepatitis C Screening  Completed   HIV Screening  Completed   HPV VACCINES  Aged Out   Meningococcal B Vaccine  Aged Out    Health Maintenance  Health Maintenance Due  Topic Date Due   Pneumococcal Vaccine 55-71 Years old (1 of 2 - PCV) Never done   Zoster Vaccines- Shingrix (1 of 2) Never done    COVID-19 Vaccine (1 - 2024-25 season) Never done   Health Maintenance Items Addressed: See Nurse Notes at the end of this note  Additional Screening:  Vision Screening: Recommended annual ophthalmology exams for early detection of glaucoma and other disorders of the eye. Would you like a referral to an eye doctor? No    Dental Screening: Recommended annual dental exams for proper oral hygiene  Community Resource Referral / Chronic Care Management: CRR required this visit?  No   CCM required this visit?  No   Plan:    I have personally reviewed and noted the following in the patient's chart:   Medical and social history Use of alcohol , tobacco or illicit drugs  Current medications and supplements including opioid prescriptions.  Patient is not currently taking opioid prescriptions. Functional ability and status Nutritional status Physical activity Advanced directives List of other physicians Hospitalizations, surgeries, and ER visits in previous 12 months Vitals Screenings to include cognitive, depression, and falls Referrals and appointments  In addition, I have reviewed and discussed with patient certain preventive protocols, quality metrics, and best practice recommendations. A written personalized care plan for preventive services as well as general preventive health recommendations were provided to patient.   Pacer Dorn L Melvena Vink, CMA   11/06/2023   After Visit Summary: (MyChart) Due to this being a telephonic visit, the after visit summary with patients personalized plan was offered to patient via MyChart   Notes: Patient declines the pneumonia and Shingrix vaccines.  He has a follow up appointment with cardiology on November 11, 2023.  He has made an appointment for a CPE with Dr. Rochelle Chu in July 2025.  Patient requested a form for a disability parking card, due to having some SOB when walking since his surgery earlier this month.  He will bring form with him to up coming office  visit.  He had no other concerns to address today.

## 2023-11-06 NOTE — Patient Instructions (Signed)
 Mr. Philip Winters , Thank you for taking time out of your busy schedule to complete your Annual Wellness Visit with me. I enjoyed our conversation and look forward to speaking with you again next year. I, as well as your care team,  appreciate your ongoing commitment to your health goals. Please review the following plan we discussed and let me know if I can assist you in the future. Your Game plan/ To Do List    Follow up Visits: Next Medicare AWV with our clinical staff: 11/07/2024.   Have you seen your provider in the last 6 months (3 months if uncontrolled diabetes)? No Next Office Visit with your provider: 12/11/2023. Patient's last office visit was on 12/08/2022.    Clinician Recommendations:  Aim for 30 minutes of exercise or brisk walking, 6-8 glasses of water , and 5 servings of fruits and vegetables each day. Remember to bring your handicap/card form back with you to your up coming visit.        This is a list of the screening recommended for you and due dates:  Health Maintenance  Topic Date Due   Medicare Annual Wellness Visit  Never done   Pneumococcal Vaccination (1 of 2 - PCV) Never done   Zoster (Shingles) Vaccine (1 of 2) Never done   COVID-19 Vaccine (1 - 2024-25 season) Never done   Flu Shot  12/18/2023   Screening for Lung Cancer  10/20/2024   Colon Cancer Screening  08/01/2026   DTaP/Tdap/Td vaccine (2 - Td or Tdap) 09/19/2026   Hepatitis C Screening  Completed   HIV Screening  Completed   HPV Vaccine  Aged Out   Meningitis B Vaccine  Aged Out    Advanced directives: (Declined) Advance directive discussed with you today. Even though you declined this today, please call our office should you change your mind, and we can give you the proper paperwork for you to fill out. Advance Care Planning is important because it:  [x]  Makes sure you receive the medical care that is consistent with your values, goals, and preferences  [x]  It provides guidance to your family and loved ones  and reduces their decisional burden about whether or not they are making the right decisions based on your wishes.  Follow the link provided in your after visit summary or read over the paperwork we have mailed to you to help you started getting your Advance Directives in place. If you need assistance in completing these, please reach out to us  so that we can help you!  See attachments for Preventive Care and Fall Prevention Tips.

## 2023-11-11 ENCOUNTER — Ambulatory Visit
Payer: Self-pay | Attending: Thoracic Surgery (Cardiothoracic Vascular Surgery) | Admitting: Thoracic Surgery (Cardiothoracic Vascular Surgery)

## 2023-11-11 ENCOUNTER — Ambulatory Visit (HOSPITAL_COMMUNITY)
Admission: RE | Admit: 2023-11-11 | Discharge: 2023-11-11 | Disposition: A | Discharge status: Home / Self Care | Source: Ambulatory Visit | Attending: Thoracic Surgery (Cardiothoracic Vascular Surgery) | Admitting: Thoracic Surgery (Cardiothoracic Vascular Surgery)

## 2023-11-11 VITALS — BP 128/75 | HR 111 | Resp 18 | Ht 66.0 in | Wt 149.0 lb

## 2023-11-11 DIAGNOSIS — I350 Nonrheumatic aortic (valve) stenosis: Secondary | ICD-10-CM | POA: Insufficient documentation

## 2023-11-11 DIAGNOSIS — R918 Other nonspecific abnormal finding of lung field: Secondary | ICD-10-CM | POA: Diagnosis not present

## 2023-11-11 DIAGNOSIS — Z951 Presence of aortocoronary bypass graft: Secondary | ICD-10-CM | POA: Diagnosis not present

## 2023-11-11 DIAGNOSIS — I3139 Other pericardial effusion (noninflammatory): Secondary | ICD-10-CM

## 2023-11-11 NOTE — Progress Notes (Signed)
 [address] [place] [address]             [phone]     HPI: [name] returns for follow-up after robotic pericardial window.  [place] is a 63 year old man with a history of previous CABG.  He underwent TAVR on 10/20/2023.  He developed left-sided chest pain.  Echo showed a posterior loculated effusion.  Right heart catheterization showed equalization of pressures.  He was relatively more tachycardic.  After discussion recommended a robotic approach to the pericardial window.  That was done on 10/22/2023.  He tolerated the procedure well.  He went home on day 4.  Has some incisional pain.  He takes tramadol  for back pain and has been using that a couple times a day.  He said that he gets short of breath all more quickly when he is walking his dogs.  Will get tired and short of breath after approximately 15 minutes when previously he go further.  Past Medical History:  Diagnosis Date   Allergy    Arthritis    CAD (coronary artery disease)    GERD (gastroesophageal reflux disease)    occ   High cholesterol    Hypertension    PAD (peripheral artery disease) (HCC)    Peripheral neuropathy    Right carotid bruit 06/10/2017   S/P TAVR (transcatheter aortic valve replacement) 10/21/2023   s/p TAVR with a 26 mm Edwards S3UR via the TF approach by Dr. Verlin and Dr. Lucas   Sciatica    Seasonal allergies    Severe aortic stenosis    Sinus tachycardia    Spinal stenosis     Current Outpatient Medications  Medication Sig Dispense Refill   acetaminophen  (TYLENOL ) 500 MG tablet Take 500-1,000 mg by mouth every 6 (six) hours as needed for moderate pain (pain score 4-6).     Alirocumab  (PRALUENT ) 75 MG/ML SOAJ Inject 1 mL (75 mg total) into the skin every 14 (fourteen) days. 2 mL 11   aspirin  EC 81 MG tablet Take 1 tablet (81 mg total) by mouth in the morning. Swallow whole. 30 tablet 12   azithromycin  (ZITHROMAX ) 500 MG tablet Take 1 tablet (500 mg total) by  mouth as directed. Take one tablet 1 hour before any dental work including cleanings. 6 tablet 12   CARTIA  XT 240 MG 24 hr capsule Take 240 mg by mouth daily.     diltiazem  (CARDIZEM  CD) 120 MG 24 hr capsule Take 2 capsules (240 mg total) by mouth daily. 180 capsule 3   doxycycline  (VIBRAMYCIN ) 100 MG capsule Take 2 tablets by mouth 1 hour prior to dental procedure. 4 capsule 2   fluticasone  (FLONASE ) 50 MCG/ACT nasal spray Place 2 sprays into both nostrils daily as needed for allergies.     hydrOXYzine  (ATARAX ) 50 MG tablet Take 50 mg by mouth at bedtime as needed (itching/sleep).     methocarbamol  (ROBAXIN ) 500 MG tablet Take 500 mg by mouth every 8 (eight) hours as needed for muscle spasms.     Multiple Vitamin (MULTIVITAMIN WITH MINERALS) TABS tablet Take 1 tablet by mouth daily.     olmesartan  (BENICAR ) 5 MG tablet Take 1 tablet (5 mg total) by mouth daily. 90 tablet 3   Polyethyl Glycol-Propyl Glycol (LUBRICANT EYE DROPS) 0.4-0.3 % SOLN Place 1-2 drops into both eyes 3 (three) times daily as needed (dry/irritated eyes).     traMADol  (ULTRAM ) 50 MG tablet Take 50 mg by  mouth every 6 (six) hours as needed for moderate pain (pain score 4-6).     colchicine  0.6 MG tablet Take 1 tablet (0.6 mg total) by mouth daily. (Patient not taking: Reported on 11/11/2023) 30 tablet 0   No current facility-administered medications for this visit.    Physical Exam BP 128/75   Pulse (!) 111   Resp 18   Ht 5' 6 (1.676 m)   Wt 149 lb (67.6 kg)   SpO2 96%   BMI 24.21 kg/m  63 year old man in no acute distress Well-developed well-nourished Incisions clean dry and intact Cardiac tachycardic irregular faint murmur Lungs clear with equal breath sounds bilaterally No peripheral edema  Diagnostic Tests: I personally reviewed his chest x-ray images.  No effusions or infiltrates.  Normal cardiac size.  Impression: [place] is a 63 year old gentleman who underwent TAVR complicated by a posterior loculated  pleural effusion with left atrial compression.  He then underwent a robotic assisted pericardial window on 10/22/2023.  He is doing well at this point in time.  He is still tachycardic but says that he is always bad.  Does get short of breath with activities but that should improve with time.  No evidence of heart failure on exam.  May gradually resume activities.  Has follow-up scheduled with cardiology in the near future.  Plan: Follow-up with cardiology as scheduled I will be happy to see [name] back anytime in the future if I can be of any further assistance with his care  Elspeth JAYSON Millers, MD [facility] [phone]

## 2023-11-13 DIAGNOSIS — H5203 Hypermetropia, bilateral: Secondary | ICD-10-CM | POA: Diagnosis not present

## 2023-11-18 ENCOUNTER — Other Ambulatory Visit (HOSPITAL_COMMUNITY): Payer: Self-pay

## 2023-11-18 ENCOUNTER — Other Ambulatory Visit: Payer: Self-pay | Admitting: Physician Assistant

## 2023-11-18 MED ORDER — COLCHICINE 0.6 MG PO TABS
0.6000 mg | ORAL_TABLET | Freq: Every day | ORAL | 3 refills | Status: DC
  Filled 2023-11-18: qty 18, 18d supply, fill #0
  Filled 2023-11-18: qty 73, 73d supply, fill #0
  Filled 2023-11-18: qty 17, 17d supply, fill #0

## 2023-11-19 ENCOUNTER — Ambulatory Visit (HOSPITAL_COMMUNITY)
Admission: RE | Admit: 2023-11-19 | Discharge: 2023-11-19 | Disposition: A | Discharge status: Home / Self Care | Source: Ambulatory Visit | Attending: Cardiology | Admitting: Cardiology

## 2023-11-19 DIAGNOSIS — Z952 Presence of prosthetic heart valve: Secondary | ICD-10-CM

## 2023-11-19 LAB — ECHOCARDIOGRAM COMPLETE
AR max vel: 2.32 cm2
AV Area VTI: 2.2 cm2
AV Area mean vel: 2.37 cm2
AV Mean grad: 7.5 mmHg
AV Peak grad: 13.9 mmHg
Ao pk vel: 1.87 m/s
Area-P 1/2: 4.63 cm2
S' Lateral: 3.2 cm

## 2023-11-20 ENCOUNTER — Ambulatory Visit: Payer: Self-pay | Admitting: Physician Assistant

## 2023-11-20 NOTE — Progress Notes (Unsigned)
 [facility]   MULTIDISCIPLINARY HEART VALVE CLINIC                                     Cardiology Office Note:    Date:  11/23/2023   ID:  Mal [name], DOB 04-28-1961, MRN [id number]  PCP:  Joshua Debby CROME, MD  [facility] Cardiologist:  Oneil Parchment, MD  [facility] Structural heart: Lonni Cash, MD [facility] Electrophysiologist:  None   Referring MD: Joshua Debby CROME, MD   1 month s/p TAVR   History of Present Illness:    TELLIS SPIVAK is a 63 y.o. male with a hx of CAD s/p 2V CABG with SVG--> D1 & LIMA--> LAD in (2021 PVT), PAD by CTs, HTN, HLD, bilateral carpal tunnel, spinal stenosis, peripheral neuropathy, inappropriate sinus tachycardia, TAA, bicuspid aortic valve with pLFLG aortic stenosis s/p TAVR (10/20/23) c/b pericardial effusion s/p VATs/pericardial window with drain placement 10/22/23 who presents to clinic for follow up.   He developed progressive exertional shortness of breath. Echo 06/30/2023 showed EF 50% and severe LFLG AS with mean grad 26 mmHg, AVA 0.6 cm2, DVI 0.16, SVI 18. L/RHC 07/30/23 showed severe two-vessel coronary disease with 2/2 patent bypass grafts, 30% mLCx and 90% pRCA lesion (vessel noted to be small). TAVR CTs showed extensive atherosclerotic calcification involving the common femoral arteries bilaterally with near occlusion of the left common femoral artery with residual luminal diameter of approximately 1-2 mm. He was not felt to be a good candidate for redo sternotomy given multiple orthopedic surgeries and slow recoveries. S/p successful TAVR with a 26 mm Edwards Sapien 3 Ultra Resilia THV via the TF approach on 10/20/23. TAVR complicated by a posterior loculated pleural effusion with left atrial compression s/p robotic assisted pericardial window on 10/22/2023. He developed PAF that converted on IV amiodarone . Discharged on colchicine  0.6 mg daily for pleuritic chest pain.  Echo 11/19/23 showed EF 55%, mild LVH, normally functioning  TAVR with a mean gradient of 7.5 mmHg and no PVL. Mild dilatation of the ascending aorta, measuring 40 mm   Today the patient presents to clinic for follow up. Here alone. No CP. He still has some SOB but thinks this is due to deconditioning. Has some pain around chest from pericardial window. Colchicine  gave him a rash. No LE edema, orthopnea or PND. No dizziness or syncope. No blood in stool or urine. No palpitations.     Past Medical History:  Diagnosis Date   Allergy    Arthritis    CAD (coronary artery disease)    GERD (gastroesophageal reflux disease)    occ   High cholesterol    Hypertension    PAD (peripheral artery disease) (HCC)    Peripheral neuropathy    Right carotid bruit 06/10/2017   S/P TAVR (transcatheter aortic valve replacement) 10/21/2023   s/p TAVR with a 26 mm Edwards S3UR via the TF approach by Dr. Cash and Dr. Lucas   Sciatica    Seasonal allergies    Severe aortic stenosis    Sinus tachycardia    Spinal stenosis      Current Medications: Current Meds  Medication Sig   acetaminophen  (TYLENOL ) 500 MG tablet Take 500-1,000 mg by mouth every 6 (six) hours as needed for moderate pain (pain score 4-6).   Alirocumab  (PRALUENT ) 75 MG/ML SOAJ Inject 1 mL (75 mg total) into the skin every  14 (fourteen) days.   aspirin  EC 81 MG tablet Take 1 tablet (81 mg total) by mouth in the morning. Swallow whole.   CARTIA  XT 240 MG 24 hr capsule Take 240 mg by mouth daily.   diltiazem  (CARDIZEM  CD) 120 MG 24 hr capsule Take 2 capsules (240 mg total) by mouth daily.   doxycycline  (VIBRAMYCIN ) 100 MG capsule Take 2 tablets by mouth 1 hour prior to dental procedure.   fluticasone  (FLONASE ) 50 MCG/ACT nasal spray Place 2 sprays into both nostrils daily as needed for allergies.   hydrOXYzine  (ATARAX ) 50 MG tablet Take 50 mg by mouth at bedtime as needed (itching/sleep).   methocarbamol  (ROBAXIN ) 500 MG tablet Take 500 mg by mouth every 8 (eight) hours as needed for muscle  spasms.   Multiple Vitamin (MULTIVITAMIN WITH MINERALS) TABS tablet Take 1 tablet by mouth daily.   olmesartan  (BENICAR ) 5 MG tablet Take 1 tablet (5 mg total) by mouth daily.   Polyethyl Glycol-Propyl Glycol (LUBRICANT EYE DROPS) 0.4-0.3 % SOLN Place 1-2 drops into both eyes 3 (three) times daily as needed (dry/irritated eyes).   traMADol  (ULTRAM ) 50 MG tablet Take 50 mg by mouth every 6 (six) hours as needed for moderate pain (pain score 4-6).      ROS:   Please see the history of present illness.    All other systems reviewed and are negative.  EKGs       Risk Assessment/Calculations:    CHA2DS2-VASc Score = 3   This indicates a 3.2% annual risk of stroke. The patient's score is based upon: CHF History: 1 HTN History: 1 Diabetes History: 0 Stroke History: 0 Vascular Disease History: 1 Age Score: 0 Gender Score: 0          Physical Exam:    VS:  BP 122/74   Pulse (!) 109   Ht 5' 8 (1.727 m)   Wt 147 lb (66.7 kg)   SpO2 96%   BMI 22.35 kg/m     Wt Readings from Last 3 Encounters:  11/23/23 147 lb (66.7 kg)  11/11/23 149 lb (67.6 kg)  11/06/23 149 lb 3.2 oz (67.7 kg)     GEN: Well nourished, well developed in no acute distress NECK: No JVD CARDIAC: RRR, 2/6 flow murmur @ RUSB. No rubs, gallops.  RESPIRATORY:  Clear to auscultation without rales, wheezing or rhonchi  ABDOMEN: Soft, non-tender, non-distended EXTREMITIES:  No edema; No deformity.    ASSESSMENT:    1. S/P TAVR (transcatheter aortic valve replacement)   2. S/P pericardial window creation   3. PAF (paroxysmal atrial fibrillation) (HCC)   4. Atherosclerosis of native coronary artery of native heart without angina pectoris   5. Primary hypertension   6. PAD (peripheral artery disease) (HCC)   7. Thoracic aortic aneurysm without rupture, unspecified part (HCC)   8. Inappropriate sinus tachycardia (HCC)   9. Bilateral carpal tunnel syndrome     PLAN:    In order of problems listed  above:  Severe AS s/p TAVR:  -- Echo 11/19/23 showed EF 55%, mild LVH, normally functioning TAVR with a mean gradient of 7.5 mmHg and no PVL. Mild dilatation of the ascending aorta, measuring 40 mm  -- NYHA class II, still with some SOB. -- SBE prophylaxis discussed; he has azithromycin  due to a PCN allergy.   -- Continue Aspirin  81mg  daily. -- I will see back for 1 year echo and OV.  Pericardial effusions s/p pericardial window:  -- Seen by Dr. Kerrin  11/11/23 and healing well.    PAF:  -- Suspect at least partly due to drain placement, converted on IV amiodarone .  -- No OAC given post op relation and recent bleed. -- Continue Cardizem  XT 240mg  daily.   CAD: -- CAD s/p 2V CABG with SVG--> D1 & LIMA--> LAD in (2021 PVT). -- [facility] 07/30/23 showed severe two-vessel coronary disease with 2/2 patent bypass grafts, 30% mLCx and 90% pRCA lesion (vessel noted to be small).  -- Continue aspirin  81mg  daily. -- Statin intolerant.  -- Continue Praulent 75mg  q 14 days.   HTN:  -- BP well controlled.  -- No changes made today.    PAD: -- Noted on pre TAVR CTs. -- Continue medical therapy.    TAA: -- 4.3cm.  -- Continue annual imaging.    Sinus tachycardia: -- He has a long history of sinus tachycardia with HRs in low 100s.  -- He has not tolerated beta blockers in the past and feels badly with HRs lower -- Continue long acting Cardizem       Increased risk for amyloid heart disease: -- With bilateral carpal tunnel, spinal stenosis, peripheral neuropathy and severe LFLG AS.  -- Discussed with pt last visit and ordered SPEP/UPEP and PYP scan, but never completed.  -- He will get this done now.    Medication Adjustments/Labs and Tests Ordered: Current medicines are reviewed at length with the patient today.  Concerns regarding medicines are outlined above.  No orders of the defined types were placed in this encounter.  No orders of the defined types were placed in this  encounter.   Patient Instructions  Medication Instructions:  Your physician recommends that you continue on your current medications as directed. Please refer to the Current Medication list given to you today.  *If you need a refill on your cardiac medications before your next appointment, please call your pharmacy*  Lab Work: None needed If you have labs (blood work) drawn today and your tests are completely normal, you will receive your results only by: MyChart Message (if you have MyChart) OR A paper copy in the mail If you have any lab test that is abnormal or we need to change your treatment, we will call you to review the results.  Testing/Procedures: Next year: 11/2024 Your physician has requested that you have an echocardiogram. Echocardiography is a painless test that uses sound waves to create images of your heart. It provides your doctor with information about the size and shape of your heart and how well your heart's chambers and valves are working. This procedure takes approximately one hour. There are no restrictions for this procedure. Please do NOT wear cologne, perfume, aftershave, or lotions (deodorant is allowed). Please arrive 15 minutes prior to your appointment time.  Please note: We ask at that you not bring children with you during ultrasound (echo/ vascular) testing. Due to room size and safety concerns, children are not allowed in the ultrasound rooms during exams. Our front office staff cannot provide observation of children in our lobby area while testing is being conducted. An adult accompanying a patient to their appointment will only be allowed in the ultrasound room at the discretion of the ultrasound technician under special circumstances. We apologize for any inconvenience.   Follow-Up: At [facility], you and your health needs are our priority.  As part of our continuing mission to provide you with exceptional heart care, our providers are all part  of one team.  This team includes  your primary Cardiologist (physician) and Advanced Practice Providers or APPs (Physician Assistants and Nurse Practitioners) who all work together to provide you with the care you need, when you need it.  Your next appointment:   As scheduled  Provider:   Oneil Parchment, MD; 04/2024  And then: Lamarr Hummer, PA-C, 11/2024   We recommend signing up for the patient portal called MyChart.  Sign up information is provided on this After Visit Summary.  MyChart is used to connect with patients for Virtual Visits (Telemedicine).  Patients are able to view lab/test results, encounter notes, upcoming appointments, etc.  Non-urgent messages can be sent to your provider as well.   To learn more about what you can do with MyChart, go to [email or web address].     Signed, Lamarr Hummer, PA-C  11/23/2023 5:23 PM    [facility]

## 2023-11-23 ENCOUNTER — Ambulatory Visit: Attending: Physician Assistant | Admitting: Physician Assistant

## 2023-11-23 ENCOUNTER — Encounter: Payer: Self-pay | Admitting: Physician Assistant

## 2023-11-23 VITALS — BP 122/74 | HR 109 | Ht 68.0 in | Wt 147.0 lb

## 2023-11-23 DIAGNOSIS — I10 Essential (primary) hypertension: Secondary | ICD-10-CM | POA: Diagnosis not present

## 2023-11-23 DIAGNOSIS — Z9889 Other specified postprocedural states: Secondary | ICD-10-CM | POA: Diagnosis not present

## 2023-11-23 DIAGNOSIS — I2510 Atherosclerotic heart disease of native coronary artery without angina pectoris: Secondary | ICD-10-CM

## 2023-11-23 DIAGNOSIS — G5603 Carpal tunnel syndrome, bilateral upper limbs: Secondary | ICD-10-CM

## 2023-11-23 DIAGNOSIS — I739 Peripheral vascular disease, unspecified: Secondary | ICD-10-CM

## 2023-11-23 DIAGNOSIS — I480 Paroxysmal atrial fibrillation: Secondary | ICD-10-CM

## 2023-11-23 DIAGNOSIS — I4711 Inappropriate sinus tachycardia, so stated: Secondary | ICD-10-CM

## 2023-11-23 DIAGNOSIS — Z952 Presence of prosthetic heart valve: Secondary | ICD-10-CM

## 2023-11-23 DIAGNOSIS — I712 Thoracic aortic aneurysm, without rupture, unspecified: Secondary | ICD-10-CM

## 2023-11-23 NOTE — Patient Instructions (Signed)
 Medication Instructions:  Your physician recommends that you continue on your current medications as directed. Please refer to the Current Medication list given to you today.  *If you need a refill on your cardiac medications before your next appointment, please call your pharmacy*  Lab Work: None needed If you have labs (blood work) drawn today and your tests are completely normal, you will receive your results only by: MyChart Message (if you have MyChart) OR A paper copy in the mail If you have any lab test that is abnormal or we need to change your treatment, we will call you to review the results.  Testing/Procedures: Next year: 11/2024 Your physician has requested that you have an echocardiogram. Echocardiography is a painless test that uses sound waves to create images of your heart. It provides your doctor with information about the size and shape of your heart and how well your heart's chambers and valves are working. This procedure takes approximately one hour. There are no restrictions for this procedure. Please do NOT wear cologne, perfume, aftershave, or lotions (deodorant is allowed). Please arrive 15 minutes prior to your appointment time.  Please note: We ask at that you not bring children with you during ultrasound (echo/ vascular) testing. Due to room size and safety concerns, children are not allowed in the ultrasound rooms during exams. Our front office staff cannot provide observation of children in our lobby area while testing is being conducted. An adult accompanying a patient to their appointment will only be allowed in the ultrasound room at the discretion of the ultrasound technician under special circumstances. We apologize for any inconvenience.   Follow-Up: At [facility], you and your health needs are our priority.  As part of our continuing mission to provide you with exceptional heart care, our providers are all part of one team.  This team includes your  primary Cardiologist (physician) and Advanced Practice Providers or APPs (Physician Assistants and Nurse Practitioners) who all work together to provide you with the care you need, when you need it.  Your next appointment:   As scheduled  Provider:   Oneil Parchment, MD; 04/2024  And then: Lamarr Hummer, PA-C, 11/2024   We recommend signing up for the patient portal called MyChart.  Sign up information is provided on this After Visit Summary.  MyChart is used to connect with patients for Virtual Visits (Telemedicine).  Patients are able to view lab/test results, encounter notes, upcoming appointments, etc.  Non-urgent messages can be sent to your provider as well.   To learn more about what you can do with MyChart, go to [email or web address].

## 2023-11-27 DIAGNOSIS — I739 Peripheral vascular disease, unspecified: Secondary | ICD-10-CM | POA: Diagnosis not present

## 2023-11-27 DIAGNOSIS — M21962 Unspecified acquired deformity of left lower leg: Secondary | ICD-10-CM | POA: Diagnosis not present

## 2023-11-30 ENCOUNTER — Encounter (HOSPITAL_COMMUNITY): Payer: Self-pay

## 2023-11-30 ENCOUNTER — Other Ambulatory Visit (HOSPITAL_COMMUNITY): Payer: Self-pay

## 2023-12-04 ENCOUNTER — Telehealth: Payer: Self-pay

## 2023-12-04 NOTE — Telephone Encounter (Signed)
 Pt came in and picked up orthotics.SABRA orthotic release form signed, labeled, and sent to scan center.

## 2023-12-11 ENCOUNTER — Encounter

## 2023-12-14 ENCOUNTER — Encounter: Payer: Self-pay | Admitting: Internal Medicine

## 2023-12-14 ENCOUNTER — Ambulatory Visit: Admitting: Internal Medicine

## 2023-12-14 VITALS — BP 138/78 | HR 90 | Temp 98.8°F | Resp 16 | Ht 68.0 in | Wt 152.0 lb

## 2023-12-14 DIAGNOSIS — Z789 Other specified health status: Secondary | ICD-10-CM

## 2023-12-14 DIAGNOSIS — D692 Other nonthrombocytopenic purpura: Secondary | ICD-10-CM

## 2023-12-14 DIAGNOSIS — G629 Polyneuropathy, unspecified: Secondary | ICD-10-CM | POA: Insufficient documentation

## 2023-12-14 DIAGNOSIS — Z0001 Encounter for general adult medical examination with abnormal findings: Secondary | ICD-10-CM

## 2023-12-14 DIAGNOSIS — Z0000 Encounter for general adult medical examination without abnormal findings: Secondary | ICD-10-CM | POA: Diagnosis not present

## 2023-12-14 DIAGNOSIS — D539 Nutritional anemia, unspecified: Secondary | ICD-10-CM | POA: Insufficient documentation

## 2023-12-14 DIAGNOSIS — Z23 Encounter for immunization: Secondary | ICD-10-CM | POA: Diagnosis not present

## 2023-12-14 DIAGNOSIS — G63 Polyneuropathy in diseases classified elsewhere: Secondary | ICD-10-CM

## 2023-12-14 DIAGNOSIS — K648 Other hemorrhoids: Secondary | ICD-10-CM

## 2023-12-14 DIAGNOSIS — I10 Essential (primary) hypertension: Secondary | ICD-10-CM

## 2023-12-14 DIAGNOSIS — T452X5A Adverse effect of vitamins, initial encounter: Secondary | ICD-10-CM

## 2023-12-14 DIAGNOSIS — G620 Drug-induced polyneuropathy: Secondary | ICD-10-CM

## 2023-12-14 DIAGNOSIS — E785 Hyperlipidemia, unspecified: Secondary | ICD-10-CM

## 2023-12-14 LAB — CBC WITH DIFFERENTIAL/PLATELET
Basophils Absolute: 0.1 K/uL (ref 0.0–0.1)
Basophils Relative: 2.2 % (ref 0.0–3.0)
Eosinophils Absolute: 0.2 K/uL (ref 0.0–0.7)
Eosinophils Relative: 3.5 % (ref 0.0–5.0)
HCT: 41.4 % (ref 39.0–52.0)
Hemoglobin: 14.1 g/dL (ref 13.0–17.0)
Lymphocytes Relative: 22.4 % (ref 12.0–46.0)
Lymphs Abs: 1.2 K/uL (ref 0.7–4.0)
MCHC: 34 g/dL (ref 30.0–36.0)
MCV: 96.6 fl (ref 78.0–100.0)
Monocytes Absolute: 0.6 K/uL (ref 0.1–1.0)
Monocytes Relative: 10.5 % (ref 3.0–12.0)
Neutro Abs: 3.3 K/uL (ref 1.4–7.7)
Neutrophils Relative %: 61.4 % (ref 43.0–77.0)
Platelets: 160 K/uL (ref 150.0–400.0)
RBC: 4.29 Mil/uL (ref 4.22–5.81)
RDW: 13.5 % (ref 11.5–15.5)
WBC: 5.4 K/uL (ref 4.0–10.5)

## 2023-12-14 LAB — HEMOGLOBIN A1C: Hgb A1c MFr Bld: 5.3 % (ref 4.6–6.5)

## 2023-12-14 MED ORDER — SHINGRIX 50 MCG/0.5ML IM SUSR
0.5000 mL | Freq: Once | INTRAMUSCULAR | 1 refills | Status: AC
Start: 2023-12-14 — End: 2023-12-14

## 2023-12-14 MED ORDER — HYDROCORT-PRAMOXINE (PERIANAL) 1-1 % EX FOAM: 1.0000 | Freq: Two times a day (BID) | CUTANEOUS | 1 refills | Status: DC

## 2023-12-14 NOTE — Patient Instructions (Signed)
 Health Maintenance, Male  Adopting a healthy lifestyle and getting preventive care are important in promoting health and wellness. Ask your health care provider about:  The right schedule for you to have regular tests and exams.  Things you can do on your own to prevent diseases and keep yourself healthy.  What should I know about diet, weight, and exercise?  Eat a healthy diet    Eat a diet that includes plenty of vegetables, fruits, low-fat dairy products, and lean protein.  Do not eat a lot of foods that are high in solid fats, added sugars, or sodium.  Maintain a healthy weight  Body mass index (BMI) is a measurement that can be used to identify possible weight problems. It estimates body fat based on height and weight. Your health care provider can help determine your BMI and help you achieve or maintain a healthy weight.  Get regular exercise  Get regular exercise. This is one of the most important things you can do for your health. Most adults should:  Exercise for at least 150 minutes each week. The exercise should increase your heart rate and make you sweat (moderate-intensity exercise).  Do strengthening exercises at least twice a week. This is in addition to the moderate-intensity exercise.  Spend less time sitting. Even light physical activity can be beneficial.  Watch cholesterol and blood lipids  Have your blood tested for lipids and cholesterol at 64 years of age, then have this test every 5 years.  You may need to have your cholesterol levels checked more often if:  Your lipid or cholesterol levels are high.  You are older than 64 years of age.  You are at high risk for heart disease.  What should I know about cancer screening?  Many types of cancers can be detected early and may often be prevented. Depending on your health history and family history, you may need to have cancer screening at various ages. This may include screening for:  Colorectal cancer.  Prostate cancer.  Skin cancer.  Lung  cancer.  What should I know about heart disease, diabetes, and high blood pressure?  Blood pressure and heart disease  High blood pressure causes heart disease and increases the risk of stroke. This is more likely to develop in people who have high blood pressure readings or are overweight.  Talk with your health care provider about your target blood pressure readings.  Have your blood pressure checked:  Every 3-5 years if you are 9-95 years of age.  Every year if you are 85 years old or older.  If you are between the ages of 29 and 29 and are a current or former smoker, ask your health care provider if you should have a one-time screening for abdominal aortic aneurysm (AAA).  Diabetes  Have regular diabetes screenings. This checks your fasting blood sugar level. Have the screening done:  Once every three years after age 23 if you are at a normal weight and have a low risk for diabetes.  More often and at a younger age if you are overweight or have a high risk for diabetes.  What should I know about preventing infection?  Hepatitis B  If you have a higher risk for hepatitis B, you should be screened for this virus. Talk with your health care provider to find out if you are at risk for hepatitis B infection.  Hepatitis C  Blood testing is recommended for:  Everyone born from [birth year] through 1966.  Anyone  with known risk factors for hepatitis C.  Sexually transmitted infections (STIs)  You should be screened each year for STIs, including gonorrhea and chlamydia, if:  You are sexually active and are younger than 64 years of age.  You are older than 64 years of age and your health care provider tells you that you are at risk for this type of infection.  Your sexual activity has changed since you were last screened, and you are at increased risk for chlamydia or gonorrhea. Ask your health care provider if you are at risk.  Ask your health care provider about whether you are at high risk for HIV. Your health care provider  may recommend a prescription medicine to help prevent HIV infection. If you choose to take medicine to prevent HIV, you should first get tested for HIV. You should then be tested every 3 months for as long as you are taking the medicine.  Follow these instructions at home:  Alcohol use  Do not drink alcohol if your health care provider tells you not to drink.  If you drink alcohol:  Limit how much you have to 0-2 drinks a day.  Know how much alcohol is in your drink. In the [place] one drink equals one 12 oz bottle of beer (355 mL), one 5 oz glass of wine (148 mL), or one 1 oz glass of hard liquor (44 mL).  Lifestyle  Do not use any products that contain nicotine or tobacco. These products include cigarettes, chewing tobacco, and vaping devices, such as e-cigarettes. If you need help quitting, ask your health care provider.  Do not use street drugs.  Do not share needles.  Ask your health care provider for help if you need support or information about quitting drugs.  General instructions  Schedule regular health, dental, and eye exams.  Stay current with your vaccines.  Tell your health care provider if:  You often feel depressed.  You have ever been abused or do not feel safe at home.  Summary  Adopting a healthy lifestyle and getting preventive care are important in promoting health and wellness.  Follow your health care provider's instructions about healthy diet, exercising, and getting tested or screened for diseases.  Follow your health care provider's instructions on monitoring your cholesterol and blood pressure.  This information is not intended to replace advice given to you by your health care provider. Make sure you discuss any questions you have with your health care provider.  Document Revised: 09/24/2020 Document Reviewed: 09/24/2020  Elsevier Patient Education  2024 [employer].

## 2023-12-14 NOTE — Progress Notes (Signed)
 "  Subjective:  Patient ID: Philip Winters, male    DOB: January 01, 1961  Age: 63 y.o. MRN: [id number]  CC: Annual Exam (Patient here for physical. Patient mentions he is having trouble with both feet he did see a podiatry ruled neuropathy they are thinking its something vascular  ) and Anemia   HPI Mal [name] presents for a CPX and f/up ---  Discussed the use of AI scribe software for clinical note transcription with the patient, who gave verbal consent to proceed.  History of Present Illness Philip Winters is a 63 year old male who presents with foot pain and difficulty walking.  He experiences stabbing and burning pain in his feet, which disrupts his sleep and limits his ability to walk long distances. Insoles provided by a foot doctor were ineffective. He reports that the clinician at [facility] thought he might have arterial issues in his feet. He also has cramps in his calves when walking, along with numbness and tingling in his feet.  He bruises easily, a condition that began after his heart surgery, which involved a valve replacement and a six-day hospital stay due to complications. He is not on blood thinners. He denies being anemic despite recent cardiac evaluations and has no known reason for anemia.  He has a painful and swollen internal hemorrhoid, which complicates personal hygiene. He uses over-the-counter Preparation H for relief.  He consumes alcohol  casually and does not smoke. He has not received vaccines for pneumonia or shingles due to insurance coverage issues before age 61.    Outpatient Medications Prior to Visit  Medication Sig Dispense Refill   acetaminophen  (TYLENOL ) 500 MG tablet Take 500-1,000 mg by mouth every 6 (six) hours as needed for moderate pain (pain score 4-6).     Alirocumab  (PRALUENT ) 75 MG/ML SOAJ Inject 1 mL (75 mg total) into the skin every 14 (fourteen) days. 2 mL 11   aspirin  EC 81 MG tablet Take 1 tablet (81 mg total) by mouth in the morning. Swallow whole.  30 tablet 12   diltiazem  (CARDIZEM  CD) 120 MG 24 hr capsule Take 2 capsules (240 mg total) by mouth daily. 180 capsule 3   fluticasone  (FLONASE ) 50 MCG/ACT nasal spray Place 2 sprays into both nostrils daily as needed for allergies.     hydrOXYzine  (ATARAX ) 50 MG tablet Take 50 mg by mouth at bedtime as needed (itching/sleep).     olmesartan  (BENICAR ) 5 MG tablet Take 1 tablet (5 mg total) by mouth daily. 90 tablet 3   Polyethyl Glycol-Propyl Glycol (LUBRICANT EYE DROPS) 0.4-0.3 % SOLN Place 1-2 drops into both eyes 3 (three) times daily as needed (dry/irritated eyes).     traMADol  (ULTRAM ) 50 MG tablet Take 50 mg by mouth every 6 (six) hours as needed for moderate pain (pain score 4-6).     CARTIA  XT 240 MG 24 hr capsule Take 240 mg by mouth daily.     Multiple Vitamin (MULTIVITAMIN WITH MINERALS) TABS tablet Take 1 tablet by mouth daily.     azithromycin  (ZITHROMAX ) 500 MG tablet Take 1 tablet (500 mg total) by mouth as directed. Take one tablet 1 hour before any dental work including cleanings. (Patient not taking: Reported on 12/14/2023) 6 tablet 12   methocarbamol  (ROBAXIN ) 500 MG tablet Take 500 mg by mouth every 8 (eight) hours as needed for muscle spasms. (Patient not taking: Reported on 12/14/2023)     colchicine  0.6 MG tablet Take 1 tablet (0.6 mg total) by mouth  daily. (Patient not taking: Reported on 12/14/2023) 90 tablet 3   doxycycline  (VIBRAMYCIN ) 100 MG capsule Take 2 tablets by mouth 1 hour prior to dental procedure. (Patient not taking: Reported on 12/14/2023) 4 capsule 2   No facility-administered medications prior to visit.    ROS Review of Systems  Constitutional:  Negative for appetite change, chills, diaphoresis, fatigue and fever.  HENT: Negative.    Eyes: Negative.   Respiratory: Negative.  Negative for cough, chest tightness, shortness of breath and wheezing.   Cardiovascular: Negative.  Negative for chest pain, palpitations and leg swelling.  Gastrointestinal:  Positive  for rectal pain. Negative for abdominal pain, anal bleeding, blood in stool, constipation, diarrhea, nausea and vomiting.  Endocrine: Negative.   Genitourinary: Negative.  Negative for difficulty urinating.  Musculoskeletal:  Positive for arthralgias. Negative for myalgias.  Skin:  Positive for color change. Negative for rash.  Neurological:  Positive for numbness. Negative for dizziness and weakness.  Hematological:  Negative for adenopathy. Bruises/bleeds easily.  Psychiatric/Behavioral:  Negative for agitation.     Objective:  BP 138/78 (BP Location: Left Arm, Patient Position: Sitting, Cuff Size: Normal)   Pulse 90   Temp 98.8 F (37.1 C) (Oral)   Resp 16   Ht 5' 8 (1.727 m)   Wt 152 lb (68.9 kg)   SpO2 96%   BMI 23.11 kg/m   BP Readings from Last 3 Encounters:  12/14/23 138/78  11/23/23 122/74  11/11/23 128/75    Wt Readings from Last 3 Encounters:  12/14/23 152 lb (68.9 kg)  11/23/23 147 lb (66.7 kg)  11/11/23 149 lb (67.6 kg)    Physical Exam Vitals reviewed.  Constitutional:      General: He is not in acute distress.    Appearance: He is not toxic-appearing or diaphoretic.  HENT:     Nose: Nose normal.  Eyes:     General: No scleral icterus. Cardiovascular:     Rate and Rhythm: Regular rhythm.     Pulses:          Dorsalis pedis pulses are 1+ on the right side and 1+ on the left side.       Posterior tibial pulses are 1+ on the right side and 1+ on the left side.     Heart sounds: Murmur heard.     No friction rub. No gallop.  Pulmonary:     Effort: Pulmonary effort is normal.     Breath sounds: No stridor. No wheezing, rhonchi or rales.  Abdominal:     General: Abdomen is flat.     Palpations: There is no mass.     Tenderness: There is no abdominal tenderness. There is no guarding.     Hernia: No hernia is present. There is no hernia in the left inguinal area or right inguinal area.  Genitourinary:    Pubic Area: No rash.      Penis: Normal.       Epididymis:     Right: Normal.     Left: Normal.     Prostate: Normal. Not enlarged, not tender and no nodules present.     Rectum: Guaiac result negative. Internal hemorrhoid present. No mass, tenderness, anal fissure or external hemorrhoid. Normal anal tone.  Musculoskeletal:        General: Normal range of motion.     Cervical back: No rigidity or tenderness.     Right lower leg: No edema.     Left lower leg: No edema.  Feet:  Left foot:     Skin integrity: Skin integrity normal.  Lymphadenopathy:     Cervical: No cervical adenopathy.     Lower Body: No right inguinal adenopathy. No left inguinal adenopathy.  Skin:    General: Skin is warm and dry.     Findings: Bruising present. No rash.  Neurological:     General: No focal deficit present.     Mental Status: He is alert.     Deep Tendon Reflexes: Reflexes normal.     Reflex Scores:      Tricep reflexes are 1+ on the right side and 1+ on the left side.      Bicep reflexes are 1+ on the right side and 1+ on the left side.      Brachioradialis reflexes are 1+ on the right side and 1+ on the left side.      Patellar reflexes are 1+ on the right side and 1+ on the left side.      Achilles reflexes are 0 on the right side and 0 on the left side. Psychiatric:        Mood and Affect: Mood normal.     Lab Results  Component Value Date   WBC 5.4 12/14/2023   HGB 14.1 12/14/2023   HCT 41.4 12/14/2023   PLT 160.0 12/14/2023   GLUCOSE 105 (H) 10/26/2023   CHOL 122 06/27/2022   TRIG 68 06/27/2022   HDL 64 06/27/2022   LDLCALC 44 06/27/2022   ALT 29 10/16/2023   AST 33 10/16/2023   NA 136 10/26/2023   K 3.6 10/26/2023   CL 101 10/26/2023   CREATININE 0.84 10/26/2023   BUN 13 10/26/2023   CO2 26 10/26/2023   TSH 2.13 12/14/2023   PSA 1.0 03/23/2023   INR 1.0 10/16/2023   HGBA1C 5.3 12/14/2023    ECHOCARDIOGRAM COMPLETE Result Date: 11/19/2023    ECHOCARDIOGRAM REPORT   Patient Name:   HAYVEN FATIMA   Date of Exam:  11/19/2023 Medical Rec #:  [id number]     Height:       66.0 in Accession #:    [id number]    Weight:       149.0 lb Date of Birth:  29-May-1960     BSA:          1.765 m Patient Age:    63 years      BP:           128/75 mmHg Patient Gender: M             HR:           95 bpm. Exam Location:  [address] Procedure: 2D Echo, Color Doppler, Cardiac Doppler and 3D Echo (Both Spectral            and Color Flow Doppler were utilized during procedure). Indications:    Post TAVR evaluation Z95.2  History:        Patient has prior history of Echocardiogram examinations, most                 recent 10/23/2023. CAD, Prior CABG, PAD; Risk                 Factors:Hypertension.                 Aortic Valve: 26 mm Edwards Sapien prosthetic, stented (TAVR)                 valve is present in the  aortic position. Procedure Date:                 10/20/2023.  Sonographer:    Augustin Seals RDCS Referring Phys: [id number] KATHRYN R THOMPSON IMPRESSIONS  1. Left ventricular ejection fraction, by estimation, is 55 to 60%. Left ventricular ejection fraction by 3D volume is 58 %. The left ventricle has normal function. The left ventricle has no regional wall motion abnormalities. There is mild left ventricular hypertrophy. Left ventricular diastolic parameters are consistent with Grade I diastolic dysfunction (impaired relaxation).  2. Right ventricular systolic function is low normal. The right ventricular size is normal. There is normal pulmonary artery systolic pressure. The estimated right ventricular systolic pressure is 19.3 mmHg.  3. The mitral valve is grossly normal. Trivial mitral valve regurgitation.  4. The aortic valve has been repaired/replaced. Aortic valve regurgitation is not visualized. There is a 26 mm Edwards Sapien prosthetic (TAVR) valve present in the aortic position. Procedure Date: 10/20/2023. Echo findings are consistent with normal structure and function of the aortic valve prosthesis. Aortic valve area, by VTI  measures 2.20 cm. Aortic valve mean gradient measures 7.5 mmHg. Aortic valve Vmax measures 1.87 m/s. Peak gradient 13.9 mmHg, DI is 0.53.  5. Aortic dilatation noted. There is mild dilatation of the ascending aorta, measuring 40 mm.  6. The inferior vena cava is normal in size with greater than 50% respiratory variability, suggesting right atrial pressure of 3 mmHg. Comparison(s): Changes from prior study are noted. 10/20/2023: LVEF 55-60%, TAVR MG 2. FINDINGS  Left Ventricle: Left ventricular ejection fraction, by estimation, is 55 to 60%. Left ventricular ejection fraction by 3D volume is 58 %. The left ventricle has normal function. The left ventricle has no regional wall motion abnormalities. The left ventricular internal cavity size was normal in size. There is mild left ventricular hypertrophy. Left ventricular diastolic parameters are consistent with Grade I diastolic dysfunction (impaired relaxation). Indeterminate filling pressures. Right Ventricle: The right ventricular size is normal. No increase in right ventricular wall thickness. Right ventricular systolic function is low normal. There is normal pulmonary artery systolic pressure. The tricuspid regurgitant velocity is 2.02 m/s,  and with an assumed right atrial pressure of 3 mmHg, the estimated right ventricular systolic pressure is 19.3 mmHg. Left Atrium: Left atrial size was normal in size. Right Atrium: Right atrial size was normal in size. Pericardium: There is no evidence of pericardial effusion. Mitral Valve: The mitral valve is grossly normal. Trivial mitral valve regurgitation. Tricuspid Valve: The tricuspid valve is grossly normal. Tricuspid valve regurgitation is trivial. Aortic Valve: The aortic valve has been repaired/replaced. Aortic valve regurgitation is not visualized. Aortic valve mean gradient measures 7.5 mmHg. Aortic valve peak gradient measures 13.9 mmHg. Aortic valve area, by VTI measures 2.20 cm. There is a 26 mm Edwards Sapien  prosthetic, stented (TAVR) valve present in the aortic position. Procedure Date: 10/20/2023. Echo findings are consistent with normal structure and function of the aortic valve prosthesis. Pulmonic Valve: The pulmonic valve was normal in structure. Pulmonic valve regurgitation is not visualized. Aorta: Aortic dilatation noted. There is mild dilatation of the ascending aorta, measuring 40 mm. Venous: The inferior vena cava is normal in size with greater than 50% respiratory variability, suggesting right atrial pressure of 3 mmHg. IAS/Shunts: No atrial level shunt detected by color flow Doppler.  LEFT VENTRICLE PLAX 2D LVIDd:         4.40 cm         Diastology LVIDs:  3.20 cm         LV e' medial:    8.38 cm/s LV PW:         1.20 cm         LV E/e' medial:  8.1 LV IVS:        1.00 cm         LV e' lateral:   10.00 cm/s LVOT diam:     2.30 cm         LV E/e' lateral: 6.8 LV SV:         73 LV SV Index:   41 LVOT Area:     4.15 cm        3D Volume EF                                LV 3D EF:    Left                                             ventricul                                             ar                                             ejection                                             fraction                                             by 3D                                             volume is                                             58 %.                                 3D Volume EF:                                3D EF:        58 %                                LV EDV:       111 ml  LV ESV:       47 ml                                LV SV:        64 ml RIGHT VENTRICLE             IVC RV Basal diam:  3.10 cm     IVC diam: 1.40 cm RV Mid diam:    2.70 cm RV S prime:     10.80 cm/s TAPSE (M-mode): 1.6 cm LEFT ATRIUM             Index        RIGHT ATRIUM           Index LA diam:        4.80 cm 2.72 cm/m   RA Area:     17.40 cm LA Vol (A2C):   51.5 ml 29.18 ml/m  RA  Volume:   44.60 ml  25.27 ml/m LA Vol (A4C):   25.0 ml 14.17 ml/m LA Biplane Vol: 37.0 ml 20.97 ml/m  AORTIC VALVE AV Area (Vmax):    2.32 cm AV Area (Vmean):   2.37 cm AV Area (VTI):     2.20 cm AV Vmax:           186.50 cm/s AV Vmean:          124.000 cm/s AV VTI:            0.332 m AV Peak Grad:      13.9 mmHg AV Mean Grad:      7.5 mmHg LVOT Vmax:         104.00 cm/s LVOT Vmean:        70.800 cm/s LVOT VTI:          0.176 m LVOT/AV VTI ratio: 0.53  AORTA Ao Root diam: 3.00 cm Ao Asc diam:  4.00 cm MITRAL VALVE                TRICUSPID VALVE MV Area (PHT): 4.63 cm     TR Peak grad:   16.3 mmHg MV Decel Time: 164 msec     TR Vmax:        202.00 cm/s MV E velocity: 68.10 cm/s MV A velocity: 108.00 cm/s  SHUNTS MV E/A ratio:  0.63         Systemic VTI:  0.18 m                             Systemic Diam: 2.30 cm Vinie Maxcy MD Electronically signed by Vinie Maxcy MD Signature Date/Time: 11/19/2023/10:43:12 PM    Final     Assessment & Plan:  Primary hypertension- BP is well controlled. -     TSH; Future  Deficiency anemia - He is no longer anemic. -     Vitamin B1; Future -     Zinc ; Future -     Vitamin B6; Future -     Folate; Future -     CBC with Differential/Platelet; Future -     Vitamin B12; Future -     Methylmalonic acid, serum; Future -     IBC + Ferritin; Future -     Protein electrophoresis, serum; Future  Neuropathy, generalized -     Vitamin B1; Future -     Zinc ; Future -     Vitamin  B6; Future -     Hemoglobin A1c; Future -     Folate; Future -     Methylmalonic acid, serum; Future -     IBC + Ferritin; Future -     Protein electrophoresis, serum; Future  Statin intolerance  Encounter for general adult medical examination with abnormal findings- Exam completed, labs reviewed, vaccines reviewed and updated, cancer screenings addressed, pt ed material was given.   Inflamed internal hemorrhoid -     Hydrocortisone  (Perianal); Place 1 Application rectally 2 (two)  times daily.  Dispense: 30 g; Refill: 1  Need for vaccination -     Pneumococcal conjugate vaccine 20-valent  Need for prophylactic vaccination and inoculation against varicella -     Shingrix ; Inject 0.5 mLs into the muscle once for 1 dose.  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  Dispense: 270 capsule; Refill: 0  Vitamin B6 induced neuropathy (HCC)     Follow-up: Return in about 4 months (around 04/15/2024).  Debby Molt, MD "

## 2023-12-15 ENCOUNTER — Ambulatory Visit: Payer: Self-pay | Admitting: Internal Medicine

## 2023-12-15 ENCOUNTER — Telehealth (HOSPITAL_COMMUNITY): Payer: Self-pay

## 2023-12-15 ENCOUNTER — Encounter: Payer: Self-pay | Admitting: Internal Medicine

## 2023-12-15 LAB — VITAMIN B12: Vitamin B-12: 383 pg/mL (ref 211–911)

## 2023-12-15 LAB — IBC + FERRITIN
Ferritin: 168.9 ng/mL (ref 22.0–322.0)
Iron: 125 ug/dL (ref 42–165)
Saturation Ratios: 29.7 % (ref 20.0–50.0)
TIBC: 421.4 ug/dL (ref 250.0–450.0)
Transferrin: 301 mg/dL (ref 212.0–360.0)

## 2023-12-15 LAB — FOLATE: Folate: >23.4 ng/mL (ref >5.9)

## 2023-12-15 LAB — TSH: TSH: 2.13 u[IU]/mL (ref 0.35–5.50)

## 2023-12-15 NOTE — Telephone Encounter (Signed)
 Attempted f/u call regarding cardiac rehab- no answer, left message. Sent MyChart message.  Closing referral.

## 2023-12-16 ENCOUNTER — Telehealth (HOSPITAL_COMMUNITY): Payer: Self-pay

## 2023-12-16 ENCOUNTER — Other Ambulatory Visit: Payer: Self-pay | Admitting: Cardiology

## 2023-12-16 MED ORDER — HYDROCORTISONE (PERIANAL) 2.5 % EX CREA: 1.0000 | TOPICAL_CREAM | Freq: Two times a day (BID) | CUTANEOUS | 1 refills | Status: AC

## 2023-12-16 NOTE — Telephone Encounter (Signed)
 Patient left message stating he had not received any prior calls or letters from us  regarding cardiac rehab. Stated he will look at his schedule and call us  some time next week for scheduling. Will reopen referral when patient calls back to confirm.

## 2023-12-18 DIAGNOSIS — G63 Polyneuropathy in diseases classified elsewhere: Secondary | ICD-10-CM | POA: Insufficient documentation

## 2023-12-18 DIAGNOSIS — G620 Drug-induced polyneuropathy: Secondary | ICD-10-CM | POA: Insufficient documentation

## 2023-12-18 LAB — PROTEIN ELECTROPHORESIS, SERUM
Albumin ELP: 4.5 g/dL (ref 3.8–4.8)
Alpha 1: 0.3 g/dL (ref 0.2–0.3)
Alpha 2: 0.6 g/dL (ref 0.5–0.9)
Beta 2: 0.4 g/dL (ref 0.2–0.5)
Beta Globulin: 0.5 g/dL (ref 0.4–0.6)
Gamma Globulin: 1.1 g/dL (ref 0.8–1.7)
Total Protein: 7.4 g/dL (ref 6.1–8.1)

## 2023-12-18 LAB — VITAMIN B1: Vitamin B1 (Thiamine): 13 nmol/L (ref 8–30)

## 2023-12-18 LAB — ZINC: Zinc: 67 ug/dL (ref 60–130)

## 2023-12-18 LAB — METHYLMALONIC ACID, SERUM: Methylmalonic Acid, Quant: 183 nmol/L (ref 69–390)

## 2023-12-18 LAB — VITAMIN B6: Vitamin B6: 55.7 ng/mL — ABNORMAL HIGH (ref 2.1–21.7)

## 2023-12-18 MED ORDER — PREGABALIN 25 MG PO CAPS: 25.0000 mg | ORAL_CAPSULE | Freq: Three times a day (TID) | ORAL | 0 refills | Status: DC

## 2023-12-23 ENCOUNTER — Telehealth: Payer: Self-pay | Admitting: Internal Medicine

## 2023-12-23 DIAGNOSIS — I739 Peripheral vascular disease, unspecified: Secondary | ICD-10-CM | POA: Diagnosis not present

## 2023-12-23 NOTE — Telephone Encounter (Signed)
 Reason for CRM: Pt is returning a missed call from [employer]. I informed the pt that she was calling to relay a message from Dr.Jones asking if he is taking any B supplements and informing him of his lab results. Pt stated that he is not currently taking any supplements.   ---  Please call for more info

## 2023-12-23 NOTE — Telephone Encounter (Signed)
 error

## 2023-12-24 ENCOUNTER — Encounter: Payer: Self-pay | Admitting: Cardiology

## 2023-12-24 ENCOUNTER — Other Ambulatory Visit: Payer: Self-pay | Admitting: Physician Assistant

## 2023-12-24 DIAGNOSIS — M79604 Pain in right leg: Secondary | ICD-10-CM

## 2023-12-24 DIAGNOSIS — Z952 Presence of prosthetic heart valve: Secondary | ICD-10-CM

## 2023-12-28 ENCOUNTER — Encounter (HOSPITAL_COMMUNITY): Payer: Self-pay | Admitting: Physician Assistant

## 2024-01-07 ENCOUNTER — Ambulatory Visit (HOSPITAL_COMMUNITY)
Admission: RE | Admit: 2024-01-07 | Discharge: 2024-01-07 | Disposition: A | Discharge status: Home / Self Care | Source: Ambulatory Visit | Attending: Cardiology | Admitting: Cardiology

## 2024-01-07 ENCOUNTER — Ambulatory Visit (HOSPITAL_BASED_OUTPATIENT_CLINIC_OR_DEPARTMENT_OTHER)
Admission: RE | Admit: 2024-01-07 | Discharge: 2024-01-07 | Disposition: A | Discharge status: Home / Self Care | Source: Ambulatory Visit | Attending: Cardiology | Admitting: Cardiology

## 2024-01-07 ENCOUNTER — Other Ambulatory Visit: Payer: Self-pay | Admitting: Physician Assistant

## 2024-01-07 DIAGNOSIS — G5603 Carpal tunnel syndrome, bilateral upper limbs: Secondary | ICD-10-CM

## 2024-01-07 DIAGNOSIS — M79604 Pain in right leg: Secondary | ICD-10-CM | POA: Diagnosis not present

## 2024-01-07 DIAGNOSIS — M79605 Pain in left leg: Secondary | ICD-10-CM | POA: Insufficient documentation

## 2024-01-07 DIAGNOSIS — Z952 Presence of prosthetic heart valve: Secondary | ICD-10-CM

## 2024-01-07 DIAGNOSIS — I350 Nonrheumatic aortic (valve) stenosis: Secondary | ICD-10-CM

## 2024-01-08 ENCOUNTER — Ambulatory Visit (HOSPITAL_COMMUNITY): Admission: RE | Admit: 2024-01-08 | Source: Ambulatory Visit

## 2024-01-08 ENCOUNTER — Telehealth: Payer: Self-pay | Admitting: Internal Medicine

## 2024-01-08 ENCOUNTER — Ambulatory Visit (HOSPITAL_COMMUNITY)
Admission: RE | Admit: 2024-01-08 | Discharge: 2024-01-08 | Disposition: A | Discharge status: Home / Self Care | Source: Ambulatory Visit | Attending: Physician Assistant | Admitting: Physician Assistant

## 2024-01-08 DIAGNOSIS — Z952 Presence of prosthetic heart valve: Secondary | ICD-10-CM

## 2024-01-08 DIAGNOSIS — I350 Nonrheumatic aortic (valve) stenosis: Secondary | ICD-10-CM

## 2024-01-08 DIAGNOSIS — G5603 Carpal tunnel syndrome, bilateral upper limbs: Secondary | ICD-10-CM | POA: Diagnosis not present

## 2024-01-08 LAB — MYOCARDIAL AMYLOID PLANAR & SPECT: H/CL Ratio: 1.2

## 2024-01-08 MED ORDER — TECHNETIUM TC 99M PYROPHOSPHATE
21.6000 | Freq: Once | INTRAVENOUS | Status: AC
  Administered 2024-01-08: 21.6 via INTRAVENOUS

## 2024-01-08 NOTE — Telephone Encounter (Signed)
 Copied from CRM [id number]. Topic: Referral - Request for Referral >> Jan 08, 2024 10:28 AM Adelita E wrote: Did the patient discuss referral with their provider in the last year? Yes (If No - schedule appointment) (If Yes - send message)  Appointment offered? No  Type of order/referral and detailed reason for visit: Dermatology for psoriasis on legs.  Preference of office, provider, location: [facility] on [address]  If referral order, have you been seen by this specialty before? No (If Yes, this issue or another issue? When? Where?  Can we respond through MyChart? Yes

## 2024-01-10 LAB — VAS US ABI WITH/WO TBI

## 2024-01-11 ENCOUNTER — Ambulatory Visit: Payer: Self-pay | Admitting: Physician Assistant

## 2024-01-11 ENCOUNTER — Ambulatory Visit: Payer: Self-pay | Admitting: Cardiology

## 2024-01-11 DIAGNOSIS — I779 Disorder of arteries and arterioles, unspecified: Secondary | ICD-10-CM

## 2024-01-13 ENCOUNTER — Other Ambulatory Visit: Payer: Self-pay | Admitting: Internal Medicine

## 2024-01-13 DIAGNOSIS — L989 Disorder of the skin and subcutaneous tissue, unspecified: Secondary | ICD-10-CM | POA: Insufficient documentation

## 2024-01-13 NOTE — Telephone Encounter (Signed)
Can you place this referral?

## 2024-01-14 ENCOUNTER — Telehealth: Payer: Self-pay

## 2024-01-14 NOTE — Telephone Encounter (Signed)
 Copied from CRM #[id number]. Topic: Clinical - Medication Question >> Jan 14, 2024  1:53 PM Philip Winters wrote: Reason for CRM: Patient questioning if PCP would be able to take over prescribing patient with traMADol  (ULTRAM ) 50 MG tablet. This medication used to be prescribed by a  Dr. Carollee, but patient stated that provider is no longer with the practice and was advised to see if PCP could fill. Callback number [phone].

## 2024-01-21 NOTE — Telephone Encounter (Signed)
 Please advise. Patient has been made aware that Dr. Joshua is out on vacation and wont be back until next week he gave a verbal understanding and states that he can wait until he gets back for a response to this.

## 2024-01-25 ENCOUNTER — Other Ambulatory Visit: Payer: Self-pay | Admitting: Internal Medicine

## 2024-01-25 DIAGNOSIS — M170 Bilateral primary osteoarthritis of knee: Secondary | ICD-10-CM

## 2024-01-25 DIAGNOSIS — M1711 Unilateral primary osteoarthritis, right knee: Secondary | ICD-10-CM

## 2024-01-25 DIAGNOSIS — M48061 Spinal stenosis, lumbar region without neurogenic claudication: Secondary | ICD-10-CM

## 2024-01-25 MED ORDER — TRAMADOL HCL 50 MG PO TABS: 50.0000 mg | ORAL_TABLET | Freq: Three times a day (TID) | ORAL | 0 refills | Status: DC | PRN

## 2024-01-26 NOTE — Telephone Encounter (Signed)
 Unable to reach patient. LMTRC

## 2024-01-29 NOTE — Telephone Encounter (Signed)
 Unable to reach patient. LMTRC

## 2024-01-29 NOTE — Telephone Encounter (Signed)
 Patient returned call and has been made aware of chart notation. Patient scheduled for 03/01/24 at 9:40 am for 3 month f/u

## 2024-02-08 ENCOUNTER — Ambulatory Visit (INDEPENDENT_AMBULATORY_CARE_PROVIDER_SITE_OTHER): Admitting: Dermatology

## 2024-02-08 ENCOUNTER — Encounter: Payer: Self-pay | Admitting: Dermatology

## 2024-02-08 VITALS — BP 114/95 | HR 119

## 2024-02-08 DIAGNOSIS — W908XXA Exposure to other nonionizing radiation, initial encounter: Secondary | ICD-10-CM

## 2024-02-08 DIAGNOSIS — Z872 Personal history of diseases of the skin and subcutaneous tissue: Secondary | ICD-10-CM | POA: Diagnosis not present

## 2024-02-08 DIAGNOSIS — L409 Psoriasis, unspecified: Secondary | ICD-10-CM | POA: Diagnosis not present

## 2024-02-08 DIAGNOSIS — L570 Actinic keratosis: Secondary | ICD-10-CM | POA: Diagnosis not present

## 2024-02-08 MED ORDER — FLUOROURACIL 5 % EX CREA: TOPICAL_CREAM | Freq: Two times a day (BID) | CUTANEOUS | 2 refills | Status: AC

## 2024-02-08 NOTE — Progress Notes (Signed)
 Follow-Up Visit   Subjective  Philip Winters is a 63 y.o. male who presents for the following: spot of concern. Crusted lesions on scalp. Previously treated with cryotherapy but bothersome still and feels unsatisfied with previous treatment attempts. Tender and itchy.  He also mentioned that he had previously started Skyrizi on samples for his Psoriasis at his previous Dermatologist but has not been able to get it covered.  The following portions of the chart were reviewed this encounter and updated as appropriate: medications, allergies, medical history  Review of Systems:  No other skin or systemic complaints except as noted in HPI or Assessment and Plan.  Objective  Well appearing patient in no apparent distress; mood and affect are within normal limits.  A focused examination was performed of the following areas: Head Scalp  Relevant exam findings are noted in the Assessment and Plan.  Scalp Erythematous thin papules/macules with gritty scale.   Assessment & Plan   ACTINIC KERATOSIS Exam: Erythematous thin papules/macules with gritty scale at the scalp and forehead  Actinic keratoses are precancerous spots that appear secondary to cumulative UV radiation exposure/sun exposure over time. They are chronic with expected duration over 1 year. A portion of actinic keratoses will progress to squamous cell carcinoma of the skin. It is not possible to reliably predict which spots will progress to skin cancer and so treatment is recommended to prevent development of skin cancer.  Recommend daily broad spectrum sunscreen SPF 30+ to sun-exposed areas, reapply every 2 hours as needed.  Recommend staying in the shade or wearing long sleeves, sun glasses (UVA+UVB protection) and wide brim hats (4-inch brim around the entire circumference of the hat). Call for new or changing lesions.  Treatment Plan: Start 5-fluorouracil  cream twice a day for 14 days to affected areas including scalp and  forehead.  Reviewed course of treatment and expected reaction.  Patient advised to expect inflammation and crusting and advised that erosions are possible.  Patient advised to be diligent with sun protection during and after treatment. Handout with details of how to apply medication and what to expect provided. Counseled to keep medication out of reach of children and pets.  Reviewed course of treatment and expected reaction.  Patient advised to expect inflammation and crusting and advised that erosions are possible.  Patient advised to be diligent with sun protection during and after treatment. Handout with details of how to apply medication and what to expect provided. Counseled to keep medication out of reach of children and pets.  HISTORY OF PRECANCEROUS ACTINIC KERATOSIS - site(s) of PreCancerous Actinic Keratosis clear today. - these may recur and new lesions may form requiring treatment to prevent transformation into skin cancer - observe for new or changing spots and contact Philip Winters for appointment if occur - photoprotection with sun protective clothing; sunglasses and broad spectrum sunscreen with SPF of at least 30 + and frequent self skin exams recommended - yearly exams by a dermatologist recommended for persons with history of PreCancerous Actinic Keratoses  PSORIASIS Exam: Well-demarcated erythematous papules/plaques with silvery scale, guttate pink scaly papules. 10% BSA.  Not at goal   Psoriasis is a chronic non-curable, but treatable genetic/hereditary disease that may have other systemic features affecting other organ systems such as joints (Psoriatic Arthritis). It is associated with an increased risk of inflammatory bowel disease, heart disease, non-alcoholic fatty liver disease, and depression.  Treatments include light and laser treatments; topical medications; and systemic medications including oral and injectables.  Treatment Plan: Will follow up with Philip Winters  for re-evaluation and benefit review for biologics    AK (ACTINIC KERATOSIS) Scalp Use efudex  2x a day for 14 days fluorouracil  (EFUDEX ) 5 % cream - Scalp Apply topically 2 (two) times daily.  Return in about 2 months (around 04/09/2024) for AK follow up with Philip Winters also an appointment with Philip Winters for Psoriasis .  I, Philip Winters, Surg Tech III, am acting as scribe for Philip CHRISTELLA HOLY, MD.   Documentation: I have reviewed the above documentation for accuracy and completeness, and I agree with the above.  Philip CHRISTELLA HOLY, MD

## 2024-02-08 NOTE — Patient Instructions (Addendum)
 - Start 5-fluorouracil  cream twice a day for 14 days to affected areas including scalp.  Reviewed course of treatment and expected reaction.  Patient advised to expect inflammation and crusting and advised that erosions are possible.  Patient advised to be diligent with sun protection during and after treatment. Handout with details of how to apply medication and what to expect provided. Counseled to keep medication out of reach of children and pets.   For areas treated with Liquid Nitrogen:  Keep clean with soap and water .  Apply Vaseline or Aquaphor twice daily.  Important Information  Due to recent changes in healthcare laws, you may see results of your pathology and/or laboratory studies on MyChart before the doctors have had a chance to review them. We understand that in some cases there may be results that are confusing or concerning to you. Please understand that not all results are received at the same time and often the doctors may need to interpret multiple results in order to provide you with the best plan of care or course of treatment. Therefore, we ask that you please give us  2 business days to thoroughly review all your results before contacting the office for clarification. Should we see a critical lab result, you will be contacted sooner.   If You Need Anything After Your Visit  If you have any questions or concerns for your doctor, please call our main line at [phone] If no one answers, please leave a voicemail as directed and we will return your call as soon as possible. Messages left after 4 pm will be answered the following business day.   You may also send us  a message via MyChart. We typically respond to MyChart messages within 1-2 business days.  For prescription refills, please ask your pharmacy to contact our office. Our fax number is [phone].  If you have an urgent issue when the clinic is closed that cannot wait until the next business day, you can page your  doctor at the number below.    Please note that while we do our best to be available for urgent issues outside of office hours, we are not available 24/7.   If you have an urgent issue and are unable to reach us , you may choose to seek medical care at your doctor's office, retail clinic, urgent care center, or emergency room.  If you have a medical emergency, please immediately call 911 or go to the emergency department. In the event of inclement weather, please call our main line at [phone] for an update on the status of any delays or closures.  Dermatology Medication Tips: Please keep the boxes that topical medications come in in order to help keep track of the instructions about where and how to use these. Pharmacies typically print the medication instructions only on the boxes and not directly on the medication tubes.   If your medication is too expensive, please contact our office at [phone] or send us  a message through MyChart.   We are unable to tell what your co-pay for medications will be in advance as this is different depending on your insurance coverage. However, we may be able to find a substitute medication at lower cost or fill out paperwork to get insurance to cover a needed medication.   If a prior authorization is required to get your medication covered by your insurance company, please allow us  1-2 business days to complete this process.  Drug prices often vary depending on where the prescription is  filled and some pharmacies may offer cheaper prices.  The website www.goodrx.com contains coupons for medications through different pharmacies. The prices here do not account for what the cost may be with help from insurance (it may be cheaper with your insurance), but the website can give you the price if you did not use any insurance.  - You can print the associated coupon and take it with your prescription to the pharmacy.  - You may also stop by our office during  regular business hours and pick up a GoodRx coupon card.  - If you need your prescription sent electronically to a different pharmacy, notify our office through [facility] or by phone at [phone]    Skin Education :   I counseled the patient regarding the following: Sun screen (SPF 30 or greater) should be applied during peak UV exposure (between 10am and 2pm) and reapplied after exercise or swimming.  The ABCDEs of melanoma were reviewed with the patient, and the importance of monthly self-examination of moles was emphasized. Should any moles change in shape or color, or itch, bleed or burn, pt will contact our office for evaluation sooner then their interval appointment.  Plan: Sunscreen Recommendations I recommended a broad spectrum sunscreen with a SPF of 30 or higher. I explained that SPF 30 sunscreens block approximately 97 percent of the sun's harmful rays. Sunscreens should be applied at least 15 minutes prior to expected sun exposure and then every 2 hours after that as long as sun exposure continues. If swimming or exercising sunscreen should be reapplied every 45 minutes to an hour after getting wet or sweating. One ounce, or the equivalent of a shot glass full of sunscreen, is adequate to protect the skin not covered by a bathing suit. I also recommended a lip balm with a sunscreen as well. Sun protective clothing can be used in lieu of sunscreen but must be worn the entire time you are exposed to the sun's rays.  Patient Handout: Wound Care for Skin Biopsy Site  Taking Care of Your Skin Biopsy Site  Proper care of the biopsy site is essential for promoting healing and minimizing scarring. This handout provides instructions on how to care for your biopsy site to ensure optimal recovery.  1. Cleaning the Wound:  Clean the biopsy site daily with gentle soap and water . Gently pat the area dry with a clean, soft towel. Avoid harsh scrubbing or rubbing the area, as this  can irritate the skin and delay healing.  2. Applying Aquaphor and Bandage:  After cleaning the wound, apply a thin layer of Aquaphor ointment to the biopsy site. Cover the area with a sterile bandage to protect it from dirt, bacteria, and friction. Change the bandage daily or as needed if it becomes soiled or wet.  3. Continued Care for One Week:  Repeat the cleaning, Aquaphor application, and bandaging process daily for one week following the biopsy procedure. Keeping the wound clean and moist during this initial healing period will help prevent infection and promote optimal healing.  4. Massaging Aquaphor into the Area:  ---After one week, discontinue the use of bandages but continue to apply Aquaphor to the biopsy site. ----Gently massage the Aquaphor into the area using circular motions. ---Massaging the skin helps to promote circulation and prevent the formation of scar tissue.   Additional Tips:  Avoid exposing the biopsy site to direct sunlight during the healing process, as this can cause hyperpigmentation or worsen scarring. If you experience  any signs of infection, such as increased redness, swelling, warmth, or drainage from the wound, contact your healthcare provider immediately. Follow any additional instructions provided by your healthcare provider for caring for the biopsy site and managing any discomfort. Conclusion:  Taking proper care of your skin biopsy site is crucial for ensuring optimal healing and minimizing scarring. By following these instructions for cleaning, applying Aquaphor, and massaging the area, you can promote a smooth and successful recovery. If you have any questions or concerns about caring for your biopsy site, don't hesitate to contact your healthcare provider for guidance.

## 2024-02-17 DIAGNOSIS — L400 Psoriasis vulgaris: Secondary | ICD-10-CM | POA: Diagnosis not present

## 2024-02-17 DIAGNOSIS — L570 Actinic keratosis: Secondary | ICD-10-CM | POA: Diagnosis not present

## 2024-02-17 DIAGNOSIS — Z79899 Other long term (current) drug therapy: Secondary | ICD-10-CM | POA: Diagnosis not present

## 2024-02-29 ENCOUNTER — Encounter: Payer: Self-pay | Admitting: Surgery

## 2024-02-29 ENCOUNTER — Other Ambulatory Visit (HOSPITAL_COMMUNITY): Payer: Self-pay

## 2024-02-29 ENCOUNTER — Ambulatory Visit: Attending: Surgery | Admitting: Surgery

## 2024-02-29 VITALS — BP 136/81 | HR 100 | Temp 98.1°F | Ht 68.0 in | Wt 148.0 lb

## 2024-02-29 DIAGNOSIS — I70213 Atherosclerosis of native arteries of extremities with intermittent claudication, bilateral legs: Secondary | ICD-10-CM | POA: Diagnosis not present

## 2024-02-29 MED ORDER — CILOSTAZOL 100 MG PO TABS
100.0000 mg | ORAL_TABLET | Freq: Two times a day (BID) | ORAL | 11 refills | Status: DC
  Filled 2024-02-29: qty 60, 30d supply, fill #0
  Filled 2024-03-23 – 2024-04-18 (×3): qty 60, 30d supply, fill #1

## 2024-02-29 NOTE — Progress Notes (Signed)
 [facility]  Patient name: Philip Winters MRN: [id number] DOB: 04-24-1961 Sex: male   REQUESTING PROVIDER:    Dr. Ginny   REASON FOR CONSULT:    PAD  HISTORY OF PRESENT ILLNESS:   Philip Winters is a 63 y.o. male, who is referred by Philip Winters for evaluation of lower extremity PAD.  He says that he is very active however he can only walk about a quarter of a mile before he starts having cramping in his left calf.  He walks his dog for about 20 minutes every day.  He does not have rest pain or ulcers.  The patient has a history of coronary artery disease status post CABG.  He has also undergone TAVR.  He is a former smoker.  He is medically managed for hypertension.  He is a former smoker.  He takes Praluent  for his cholesterol  PAST MEDICAL HISTORY    Past Medical History:  Diagnosis Date   Allergy    Arthritis    CAD (coronary artery disease)    GERD (gastroesophageal reflux disease)    occ   High cholesterol    Hypertension    PAD (peripheral artery disease)    Peripheral neuropathy    Right carotid bruit 06/10/2017   S/P TAVR (transcatheter aortic valve replacement) 10/21/2023   s/p TAVR with a 26 mm Edwards S3UR via the TF approach by Dr. Verlin and Dr. Lucas   Sciatica    Seasonal allergies    Severe aortic stenosis    Sinus tachycardia    Spinal stenosis      FAMILY HISTORY   Family History  Problem Relation Age of Onset   Hypertension Mother    Throat cancer Mother    COPD Mother    Hypertension Father    Cancer Father        Mesothelioma   Colon cancer Neg Hx    Esophageal cancer Neg Hx    Stomach cancer Neg Hx    Rectal cancer Neg Hx    Colon polyps Neg Hx     SOCIAL HISTORY:   Social History   Socioeconomic History   Marital status: Married    Spouse name: Philip Winters   Number of children: 0   Years of education: 12   Highest education level: Not on file  Occupational History    Occupation: Self employed    Comment: Nutritional therapist   Occupation: Retired Nutritional therapist  Tobacco Use   Smoking status: Former    Current packs/day: 0.00    Average packs/day: 1 pack/day for 37.0 years (37.0 ttl pk-yrs)    Types: Cigarettes    Start date: 05/05/1979    Quit date: 05/04/2016    Years since quitting: 7.8   Smokeless tobacco: Never  Vaping Use   Vaping status: Former  Substance and Sexual Activity   Alcohol  use: Yes    Alcohol /week: 5.0 standard drinks of alcohol     Types: 5 Glasses of wine per week    Comment: weekends only with dinner   Drug use: No   Sexual activity: Yes    Partners: Female  Other Topics Concern   Not on file  Social History Narrative   Lives with wife, [name]   Caffeine use: 1 Coffee daily   Right handed    Social Drivers of Health   Financial Resource Strain: Low Risk  (11/06/2023)   Overall Financial Resource Strain (CARDIA)    Difficulty of Paying Living Expenses:  Not hard at all  Food Insecurity: No Food Insecurity (11/06/2023)   Hunger Vital Sign    Worried About Running Out of Food in the Last Year: Never true    Ran Out of Food in the Last Year: Never true  Transportation Needs: No Transportation Needs (11/06/2023)   PRAPARE - Administrator, Civil Service (Medical): No    Lack of Transportation (Non-Medical): No  Physical Activity: Inactive (11/06/2023)   Exercise Vital Sign    Days of Exercise per Week: 0 days    Minutes of Exercise per Session: 0 min  Stress: No Stress Concern Present (11/06/2023)   [employer] of Occupational Health - Occupational Stress Questionnaire    Feeling of Stress: Not at all  Social Connections: Moderately Isolated (11/06/2023)   Social Connection and Isolation Panel    Frequency of Communication with Friends and Family: Twice a week    Frequency of Social Gatherings with Friends and Family: Once a week    Attends Religious Services: Never    Database administrator or Organizations:  No    Attends Banker Meetings: Never    Marital Status: Married  Catering manager Violence: Not At Risk (11/06/2023)   Humiliation, Afraid, Rape, and Kick questionnaire    Fear of Current or Ex-Partner: No    Emotionally Abused: No    Physically Abused: No    Sexually Abused: No    ALLERGIES:    Allergies  Allergen Reactions   Mushroom Extract Complex (Obsolete) Anaphylaxis   Penicillins Anaphylaxis    Childhood allergy.  Has patient had a PCN reaction causing immediate rash, facial/tongue/throat swelling, SOB or lightheadedness with hypotension: No Has patient had a PCN reaction causing severe rash involving mucus membranes or skin necrosis: No Has patient had a PCN reaction that required hospitalization No Has patient had a PCN reaction occurring within the last 10 years: No If all of the above answers are NO, then may proceed with Cephalosporin.  (Has taken Keflex  w/o adverse reaction).    Colchicine  Rash   Chantix [Varenicline] Other (See Comments)    headaches    Crestor [Rosuvastatin] Other (See Comments)    myalgia   Dilaudid  [Hydromorphone ] Nausea And Vomiting   Lipitor [Atorvastatin] Other (See Comments)    myalgia   Losartan Potassium Other (See Comments)    Lethargic    Lovaza [Omega-3-Acid Ethyl Esters (Fish)] Other (See Comments)    myalgia   Oxycodone  Hcl Other (See Comments)    hallucinations   Pravachol [Pravastatin] Other (See Comments)    myalgia   Bystolic [Nebivolol Hcl] Other (See Comments)    Extreme fatigue, not able to focus   Codeine Nausea And Vomiting   Lopressor  [Metoprolol ] Other (See Comments)    Made patient very fatigued, could not focus   Red Dye #40 (Allura Red) Rash   Repatha  [Evolocumab ] Rash and Other (See Comments)    Hip pain and rash    CURRENT MEDICATIONS:    Current Outpatient Medications  Medication Sig Dispense Refill   acetaminophen  (TYLENOL ) 500 MG tablet Take 500-1,000 mg by mouth every 6 (six) hours  as needed for moderate pain (pain score 4-6).     Alirocumab  (PRALUENT ) 75 MG/ML SOAJ Inject 1 mL (75 mg total) into the skin every 14 (fourteen) days. 2 mL 11   aspirin  EC 81 MG tablet Take 1 tablet (81 mg total) by mouth in the morning. Swallow whole. 30 tablet 12   diltiazem  (CARDIZEM   CD) 120 MG 24 hr capsule Take 2 capsules (240 mg total) by mouth daily. 180 capsule 3   diltiazem  (CARDIZEM  CD) 240 MG 24 hr capsule TAKE 1 CAPSULE(240 MG) BY MOUTH DAILY 90 capsule 3   fluorouracil  (EFUDEX ) 5 % cream Apply topically 2 (two) times daily. 40 g 2   fluticasone  (FLONASE ) 50 MCG/ACT nasal spray Place 2 sprays into both nostrils daily as needed for allergies.     hydrocortisone  (PROCTO-MED HC ) 2.5 % rectal cream Place 1 Application rectally 2 (two) times daily. 30 g 1   hydrOXYzine  (ATARAX ) 50 MG tablet Take 50 mg by mouth at bedtime as needed (itching/sleep).     olmesartan  (BENICAR ) 5 MG tablet Take 1 tablet (5 mg total) by mouth daily. 90 tablet 3   Polyethyl Glycol-Propyl Glycol (LUBRICANT EYE DROPS) 0.4-0.3 % SOLN Place 1-2 drops into both eyes 3 (three) times daily as needed (dry/irritated eyes).     pregabalin  (LYRICA ) 25 MG capsule Take 1 capsule (25 mg total) by mouth 3 (three) times daily. 270 capsule 0   traMADol  (ULTRAM ) 50 MG tablet Take 1 tablet (50 mg total) by mouth every 8 (eight) hours as needed for moderate pain (pain score 4-6). 270 tablet 0   azithromycin  (ZITHROMAX ) 500 MG tablet Take 1 tablet (500 mg total) by mouth as directed. Take one tablet 1 hour before any dental work including cleanings. (Patient not taking: Reported on 02/29/2024) 6 tablet 12   No current facility-administered medications for this visit.    REVIEW OF SYSTEMS:   [X]  denotes positive finding, [ ]  denotes negative finding Cardiac  Comments:  Chest pain or chest pressure:    Shortness of breath upon exertion:    Short of breath when lying flat:    Irregular heart rhythm:        Vascular    Pain in  calf, thigh, or hip brought on by ambulation: x   Pain in feet at night that wakes you up from your sleep:     Blood clot in your veins:    Leg swelling:         Pulmonary    Oxygen at home:    Productive cough:     Wheezing:         Neurologic    Sudden weakness in arms or legs:     Sudden numbness in arms or legs:     Sudden onset of difficulty speaking or slurred speech:    Temporary loss of vision in one eye:     Problems with dizziness:         Gastrointestinal    Blood in stool:      Vomited blood:         Genitourinary    Burning when urinating:     Blood in urine:        Psychiatric    Major depression:         Hematologic    Bleeding problems:    Problems with blood clotting too easily:        Skin    Rashes or ulcers:        Constitutional    Fever or chills:     PHYSICAL EXAM:   Vitals:   02/29/24 1007  BP: 136/81  Pulse: 100  Temp: 98.1 F (36.7 C)  SpO2: 98%  Weight: 148 lb (67.1 kg)  Height: 5' 8 (1.727 m)    GENERAL: The patient is a well-nourished male, in no  acute distress. The vital signs are documented above. CARDIAC: There is a regular rate and rhythm.  VASCULAR: Palpable femoral pulses bilaterally.  Nonpalpable pedal pulses PULMONARY: Nonlabored respirations ABDOMEN: Soft and non-tender .  MUSCULOSKELETAL: There are no major deformities or cyanosis. NEUROLOGIC: No focal weakness or paresthesias are detected. SKIN: There are no ulcers or rashes noted. PSYCHIATRIC: The patient has a normal affect.  STUDIES:   I have reviewed the following: ABI/TBIToday's ABIToday's TBIPrevious ABIPrevious TBI  +-------+-----------+-----------+------------+------------+  Right [place]         0.6                                  +-------+-----------+-----------+------------+------------+  Left  [place]         0.87                                 +-------+-----------+-----------+------------+-------  Right: No evidence of stenosis in the  LE arterial tree.  Distal peroneal artery appears occluded.   Left: 50-74% stenosis at the distal CFA/prox PFA.  The distal peroneal artery appears occluded.  ASSESSMENT and PLAN   Lower extremity PAD, left greater than right: I discussed with the patient that the most important aspect is medical therapy at this time.  Because of his underlying coronary disease, he is on guideline directed medical therapy for his vascular disease.  I have encouraged him to increase his exercise.  I am also starting him on cilostazol.  He knows to contact me should he develop a wound.  Otherwise we will see him back in 6 months.    Malvina Serene CLORE, MD, FACS [facility] [phone] Pager [phone]

## 2024-03-01 ENCOUNTER — Encounter: Payer: Self-pay | Admitting: Internal Medicine

## 2024-03-01 ENCOUNTER — Ambulatory Visit: Admitting: Internal Medicine

## 2024-03-01 VITALS — BP 126/68 | HR 111 | Temp 98.4°F | Resp 16 | Ht 68.0 in | Wt 149.8 lb

## 2024-03-01 DIAGNOSIS — G620 Drug-induced polyneuropathy: Secondary | ICD-10-CM | POA: Diagnosis not present

## 2024-03-01 DIAGNOSIS — K760 Fatty (change of) liver, not elsewhere classified: Secondary | ICD-10-CM | POA: Diagnosis not present

## 2024-03-01 DIAGNOSIS — G63 Polyneuropathy in diseases classified elsewhere: Secondary | ICD-10-CM

## 2024-03-01 DIAGNOSIS — I10 Essential (primary) hypertension: Secondary | ICD-10-CM | POA: Diagnosis not present

## 2024-03-01 DIAGNOSIS — G4700 Insomnia, unspecified: Secondary | ICD-10-CM | POA: Insufficient documentation

## 2024-03-01 DIAGNOSIS — I6523 Occlusion and stenosis of bilateral carotid arteries: Secondary | ICD-10-CM | POA: Insufficient documentation

## 2024-03-01 DIAGNOSIS — R932 Abnormal findings on diagnostic imaging of liver and biliary tract: Secondary | ICD-10-CM | POA: Insufficient documentation

## 2024-03-01 DIAGNOSIS — J309 Allergic rhinitis, unspecified: Secondary | ICD-10-CM | POA: Insufficient documentation

## 2024-03-01 DIAGNOSIS — R7303 Prediabetes: Secondary | ICD-10-CM | POA: Insufficient documentation

## 2024-03-01 DIAGNOSIS — I2510 Atherosclerotic heart disease of native coronary artery without angina pectoris: Secondary | ICD-10-CM

## 2024-03-01 DIAGNOSIS — T452X5A Adverse effect of vitamins, initial encounter: Secondary | ICD-10-CM

## 2024-03-01 DIAGNOSIS — E785 Hyperlipidemia, unspecified: Secondary | ICD-10-CM

## 2024-03-01 DIAGNOSIS — K219 Gastro-esophageal reflux disease without esophagitis: Secondary | ICD-10-CM | POA: Insufficient documentation

## 2024-03-01 DIAGNOSIS — I7300 Raynaud's syndrome without gangrene: Secondary | ICD-10-CM | POA: Insufficient documentation

## 2024-03-01 DIAGNOSIS — I7121 Aneurysm of the ascending aorta, without rupture: Secondary | ICD-10-CM | POA: Insufficient documentation

## 2024-03-01 DIAGNOSIS — R748 Abnormal levels of other serum enzymes: Secondary | ICD-10-CM | POA: Insufficient documentation

## 2024-03-01 LAB — LIPID PANEL
Cholesterol: 120 mg/dL (ref 0–200)
HDL: 58.2 mg/dL (ref >39.00)
LDL Cholesterol: 46 mg/dL (ref 0–99)
NonHDL: 61.56
Total CHOL/HDL Ratio: 2
Triglycerides: 76 mg/dL (ref 0.0–149.0)
VLDL: 15.2 mg/dL (ref 0.0–40.0)

## 2024-03-01 LAB — HEPATIC FUNCTION PANEL
ALT: 25 U/L (ref 0–53)
AST: 32 U/L (ref 0–37)
Albumin: 4.5 g/dL (ref 3.5–5.2)
Alkaline Phosphatase: 126 U/L — ABNORMAL HIGH (ref 39–117)
Bilirubin, Direct: 0.2 mg/dL (ref 0.0–0.3)
Total Bilirubin: 0.6 mg/dL (ref 0.2–1.2)
Total Protein: 7.6 g/dL (ref 6.0–8.3)

## 2024-03-01 MED ORDER — PREGABALIN 25 MG PO CAPS: 25.0000 mg | ORAL_CAPSULE | Freq: Three times a day (TID) | ORAL | 0 refills | Status: AC

## 2024-03-01 MED ORDER — COVID-19 MRNA VAC-TRIS(PFIZER) 30 MCG/0.3ML IM SUSY: 0.3000 mL | PREFILLED_SYRINGE | Freq: Once | INTRAMUSCULAR | 0 refills | Status: DC

## 2024-03-01 NOTE — Progress Notes (Signed)
 Subjective:  Patient ID: Philip Winters, male    DOB: 28-Nov-1960  Age: 63 y.o. MRN: [id number]  CC: Hypertension and Hyperlipidemia   HPI Philip Winters presents for f/up -  Discussed the use of AI scribe software for clinical note transcription with the patient, who gave verbal consent to proceed.  History of Present Illness Philip Winters is a 63 year old male who presents with neuropathy symptoms and elevated B6 levels.  He has a consistently elevated heart rate of 110 bpm, which has persisted over the years. Previously, he was prescribed medication to lower his heart rate, which reduced it to 80 bpm but caused significant fatigue, preventing him from getting out of bed. He has since stopped taking medication for this issue as it does not cause discomfort, such as chest pain, shortness of breath, dizziness, or lightheadedness.  He experiences neuropathy and has a history of elevated B6 levels. The symptoms are significant enough to wake him up at night. He has reduced his intake of B vitamins, including discontinuing a multivitamin that contained 3000 mg of B6. Despite these changes, he continues to consume a banana daily. He recently visited a pain doctor due to blockages in his leg and was prescribed a new medication, the name of which he cannot recall, to address these symptoms. He reports that neuropathy symptoms bother him both during the day and at night.  He reports leg pain when walking, which led to an ultrasound of his legs. This revealed a clogged artery in his left leg, prompting a visit to a doctor yesterday who prescribed medication for this condition.  He has a history of smoking, which he believes has negatively impacted his health. No chest pain, shortness of breath, dizziness, lightheadedness, or coughing.     Outpatient Medications Prior to Visit  Medication Sig Dispense Refill   acetaminophen  (TYLENOL ) 500 MG tablet Take 500-1,000 mg by mouth every 6 (six) hours as needed  for moderate pain (pain score 4-6).     Alirocumab  (PRALUENT ) 75 MG/ML SOAJ Inject 1 mL (75 mg total) into the skin every 14 (fourteen) days. 2 mL 11   aspirin  EC 81 MG tablet Take 1 tablet (81 mg total) by mouth in the morning. Swallow whole. 30 tablet 12   azithromycin  (ZITHROMAX ) 500 MG tablet Take 1 tablet (500 mg total) by mouth as directed. Take one tablet 1 hour before any dental work including cleanings. 6 tablet 12   cilostazol (PLETAL) 100 MG tablet Take 1 tablet (100 mg total) by mouth 2 (two) times daily before a meal. 60 tablet 11   diltiazem  (CARDIZEM  CD) 120 MG 24 hr capsule Take 2 capsules (240 mg total) by mouth daily. 180 capsule 3   diltiazem  (CARDIZEM  CD) 240 MG 24 hr capsule TAKE 1 CAPSULE(240 MG) BY MOUTH DAILY 90 capsule 3   fluorouracil  (EFUDEX ) 5 % cream Apply topically 2 (two) times daily. 40 g 2   fluticasone  (FLONASE ) 50 MCG/ACT nasal spray Place 2 sprays into both nostrils daily as needed for allergies.     hydrocortisone  (PROCTO-MED HC ) 2.5 % rectal cream Place 1 Application rectally 2 (two) times daily. 30 g 1   hydrOXYzine  (ATARAX ) 50 MG tablet Take 50 mg by mouth at bedtime as needed (itching/sleep).     olmesartan  (BENICAR ) 5 MG tablet Take 1 tablet (5 mg total) by mouth daily. 90 tablet 3   Polyethyl Glycol-Propyl Glycol (LUBRICANT EYE DROPS) 0.4-0.3 % SOLN Place 1-2 drops into both  eyes 3 (three) times daily as needed (dry/irritated eyes).     SKYRIZI PEN 150 MG/ML pen Inject into the skin.     traMADol  (ULTRAM ) 50 MG tablet Take 1 tablet (50 mg total) by mouth every 8 (eight) hours as needed for moderate pain (pain score 4-6). 270 tablet 0   pregabalin  (LYRICA ) 25 MG capsule Take 1 capsule (25 mg total) by mouth 3 (three) times daily. 270 capsule 0   No facility-administered medications prior to visit.    ROS Review of Systems  Constitutional:  Negative for appetite change, chills, diaphoresis, fatigue and fever.  HENT: Negative.    Respiratory: Negative.   Negative for cough, chest tightness, shortness of breath and wheezing.   Cardiovascular:  Negative for chest pain, palpitations and leg swelling.  Gastrointestinal:  Negative for abdominal pain, constipation, diarrhea, nausea and vomiting.  Genitourinary: Negative.  Negative for difficulty urinating.  Musculoskeletal:  Positive for arthralgias and myalgias.  Skin: Negative.   Neurological:  Negative for dizziness, weakness and light-headedness.  Hematological:  Negative for adenopathy. Does not bruise/bleed easily.  Psychiatric/Behavioral: Negative.      Objective:  BP 126/68 (BP Location: Left Arm, Patient Position: Sitting, Cuff Size: Normal)   Pulse (!) 111   Temp 98.4 F (36.9 C) (Oral)   Resp 16   Ht 5' 8 (1.727 m)   Wt 149 lb 12.8 oz (67.9 kg)   SpO2 97%   BMI 22.78 kg/m   BP Readings from Last 3 Encounters:  03/01/24 126/68  02/29/24 136/81  02/08/24 (!) 114/95    Wt Readings from Last 3 Encounters:  03/01/24 149 lb 12.8 oz (67.9 kg)  02/29/24 148 lb (67.1 kg)  12/14/23 152 lb (68.9 kg)    Physical Exam Vitals reviewed.  HENT:     Mouth/Throat:     Mouth: Mucous membranes are moist.  Eyes:     General: No scleral icterus.    Conjunctiva/sclera: Conjunctivae normal.  Cardiovascular:     Rate and Rhythm: Tachycardia present.     Heart sounds: Murmur heard.     Systolic murmur is present with a grade of 1/6.     No friction rub. No gallop.  Pulmonary:     Effort: Pulmonary effort is normal.     Breath sounds: No stridor. No wheezing, rhonchi or rales.  Abdominal:     General: Abdomen is flat.     Palpations: There is no mass.     Tenderness: There is no abdominal tenderness. There is no guarding.     Hernia: No hernia is present.  Musculoskeletal:        General: Normal range of motion.     Right lower leg: No edema.     Left lower leg: No edema.  Skin:    General: Skin is warm and dry.  Neurological:     General: No focal deficit present.      Mental Status: He is alert.  Psychiatric:        Mood and Affect: Mood normal.        Behavior: Behavior normal.     Lab Results  Component Value Date   WBC 5.4 12/14/2023   HGB 14.1 12/14/2023   HCT 41.4 12/14/2023   PLT 160.0 12/14/2023   GLUCOSE 105 (H) 10/26/2023   CHOL 120 03/01/2024   TRIG 76.0 03/01/2024   HDL 58.20 03/01/2024   LDLCALC 46 03/01/2024   ALT 25 03/01/2024   AST 32 03/01/2024   NA  136 10/26/2023   K 3.6 10/26/2023   CL 101 10/26/2023   CREATININE 0.84 10/26/2023   BUN 13 10/26/2023   CO2 26 10/26/2023   TSH 2.13 12/14/2023   PSA 1.0 03/23/2023   INR 1.0 10/16/2023   HGBA1C 5.3 12/14/2023    MYOCARDIAL AMYLOID/CT RAD READ Result Date: 01/11/2024 CLINICAL DATA:  This over-read does not include interpretation of cardiac or coronary anatomy or pathology. The cardiac SPECT CT interpretation by the cardiologist is attached. COMPARISON:  CT October 21, 2023 FINDINGS: Vascular: Aortic atherosclerosis. Coronary artery calcifications. Aortic valve prosthesis. Mediastinum/Nodes: No pathologically enlarged mediastinal lymph nodes. Lungs/Pleura: Scattered atelectasis/scarring. Motion degraded examination of the lungs. Upper Abdomen: No acute abnormality. Musculoskeletal: Prior median sternotomy.  Thoracic spondylosis. IMPRESSION: 1. No acute extracardiac abnormality. 2. Aortic atherosclerosis. 3. Coronary artery calcifications. Aortic Atherosclerosis (ICD10-I70.0). Electronically Signed   By: Reyes Holder M.D.   On: 01/11/2024 07:29   MYOCARDIAL AMYLOID IMAGING PLANAR AND SPECT Result Date: 01/08/2024   Findings are not suggestive of cardiac ATTR amyloidosis. The myocardium was very minimally positive for radiotracer uptake.   The visual grade of myocardial uptake relative to the ribs was Grade 1 (Myocardial uptake less than rib uptake).   CT images were obtained for attenuation correction and were examined for the presence of coronary calcium  when appropriate.   Coronary  calcium  assessment not performed due to prior revascularization. Aortic atherosclerosis noted.  LIMA and vein graft markers noted.  Prosthetic aortic valve noted   Prior study not available for comparison.    Fibrosis 4 Score = 2.56 (Indeterminate)       Interpretation for patients with NAFLD          <1.30       -  F0-F1 (Low risk)          1.30-2.67 -  Indeterminate           >2.67      -  F3-F4 (High risk)     Validated for ages 31-65         Assessment & Plan:  Vitamin B6 induced neuropathy -     Vitamin B6; Future -     Pregabalin ; Take 1 capsule (25 mg total) by mouth 3 (three) times daily.  Dispense: 270 capsule; Refill: 0  Primary hypertension- BP is well controlled.  Neuropathy due to medical condition -     Pregabalin ; Take 1 capsule (25 mg total) by mouth 3 (three) times daily.  Dispense: 270 capsule; Refill: 0  Fatty liver -     Hepatic function panel; Future  Dyslipidemia, goal LDL below 70- LDL goal achieved. -     Lipid panel; Future     Follow-up: Return in about 4 months (around 07/02/2024).  Debby Molt, MD

## 2024-03-01 NOTE — Patient Instructions (Signed)
 Nerve Damage (Peripheral Neuropathy): What to Know Peripheral neuropathy happens when there's damage to the peripheral nerves. These nerves send signals between your spinal cord and your arms and legs. You can have damage in one or more nerves. What are the causes? There are many reasons why nerves can get damaged. Damage may be caused by some diseases, such as: Diabetes. This is the most common cause. Autoimmune diseases, such as rheumatoid arthritis or lupus. These happen when your body's defense system (immune system) attacks healthy parts of your body by mistake. Inherited nerve disease. This is passed down in families. Kidney disease. Thyroid disease. Other causes include: Pressure or stress on a nerve that lasts a long time. Lack of B vitamins from poor diet or drinking too much alcohol. Infections. Some medicines, like chemotherapy used for cancer treatment. Poisonous (toxic) substances, such as lead or mercury. Too little blood flowing to the legs. Sometimes, the cause isn't known. What are the signs or symptoms? Symptoms depend on which nerves are damaged. In your legs, hands, or arms, you may have: Loss of feeling (numbness). Tingling. Burning pain. Very sensitive skin. Weakness. Paralysis. This is when you can't move a part of your body. Clumsiness. Muscle twitching. Loss of balance. You may have trouble walking. Other symptoms can include: Not being able to control when you pee. Feeling dizzy. Problems during sex. How is this diagnosed? Your health care provider will: Ask about your symptoms and medical history. Do a physical exam. Do a neurological exam. They will check your reflexes, how you move, and what you can feel. You may need to have other tests, such as: Blood tests. Nerve tests to check how well your nerves and muscles work. Imaging tests, such as a CT scan or MRI. These are done to rule out other causes. Nerve biopsy. This is when a small piece of a  nerve is removed for testing. Lumbar puncture. A small amount of the fluid that surrounds the brain and spinal cord is taken and checked in a lab. How is this treated? Treatment may include: Treating the cause of the nerve damage. Pain medicines. Other medicines that may help with pain, such as: Medicines that treat seizures. Medicines that treat depression. Pain patches or creams that are put on painful areas of skin. TENS therapy. This uses a device that sends mild electrical pulses to your nerves. This will prevent pain signals from reaching the brain. Massage. Acupuncture. Surgery to relieve pressure on a nerve or to destroy a nerve that's causing pain. This is done only if the other treatments are not helping. You may also need physical therapy to help improve your movement, balance, and muscle strength. You may be told to use a cane or walker if needed. Follow these instructions at home: Medicines Take your medicines only as told. You may need to take steps to help treat or prevent trouble pooping (constipation), such as: Taking medicines to help you poop. Eating foods high in fiber, like beans, whole grains, and fresh fruits and vegetables. Drinking more fluids as told. Ask your provider if it's safe to drive or use machines while taking your medicine. Lifestyle  Do not smoke, vape, or use nicotine or tobacco. Avoid or limit alcohol. Too much alcohol can be harmful to nerves and cause damage. Eat a healthy diet. Safety  Take care to avoid burning or damaging your skin if you have numbness. If you have numbness in your feet: Check your feet each day for signs of injury  or infection. Watch for redness, warmth, and swelling. Wear padded socks and comfortable shoes. These help protect your feet. General instructions If you have diabetes, keep your blood sugar under control. Develop a good support system. Living with nerve damage can cause a lot of stress. Talk with a mental  health therapist or join a support group. Exercise as told. Ask what things are safe for you to do at home. Work with a pain specialist to find the best way to manage your pain. Keep all follow-up visits. Your provider will check if the treatments are working and change them if needed. Where to find support The Foundation for Peripheral Neuropathy: [employer]si Where to find more information To learn more, go to: [employer] Neurological Disorders and Stroke at [employer]. Click Search and type peripheral neuropathy. Find the link you need. Contact a health care provider if: Your pain treatments are not working. Your symptoms are getting worse. You have side effects from any of your medicines. You have new symptoms. You're having a hard time coping with your condition. Get help right away if: You have a wound that's not healing. You have new weakness in an arm or leg. You've fallen or you fall often. This information is not intended to replace advice given to you by your health care provider. Make sure you discuss any questions you have with your health care provider. Document Revised: 07/30/2023 Document Reviewed: 07/30/2023 Elsevier Patient Education  2025 [employer].

## 2024-03-03 ENCOUNTER — Other Ambulatory Visit: Payer: Self-pay | Admitting: *Deleted

## 2024-03-03 DIAGNOSIS — I70213 Atherosclerosis of native arteries of extremities with intermittent claudication, bilateral legs: Secondary | ICD-10-CM

## 2024-03-04 ENCOUNTER — Ambulatory Visit: Payer: Self-pay | Admitting: Internal Medicine

## 2024-03-04 LAB — VITAMIN B6: Vitamin B6: 29.1 ng/mL — ABNORMAL HIGH (ref 2.1–21.7)

## 2024-03-07 ENCOUNTER — Encounter: Admitting: Surgery

## 2024-03-09 ENCOUNTER — Ambulatory Visit: Admitting: Physician Assistant

## 2024-03-14 ENCOUNTER — Other Ambulatory Visit: Payer: Self-pay | Admitting: Cardiology

## 2024-03-14 ENCOUNTER — Ambulatory Visit: Payer: Self-pay

## 2024-03-14 DIAGNOSIS — I350 Nonrheumatic aortic (valve) stenosis: Secondary | ICD-10-CM

## 2024-03-14 DIAGNOSIS — I25110 Atherosclerotic heart disease of native coronary artery with unstable angina pectoris: Secondary | ICD-10-CM

## 2024-03-14 DIAGNOSIS — I700 Atherosclerosis of aorta: Secondary | ICD-10-CM

## 2024-03-14 DIAGNOSIS — I2510 Atherosclerotic heart disease of native coronary artery without angina pectoris: Secondary | ICD-10-CM

## 2024-03-14 DIAGNOSIS — Z789 Other specified health status: Secondary | ICD-10-CM

## 2024-03-14 NOTE — Telephone Encounter (Signed)
 FYI Only or Action Required?: FYI only for provider.  Patient was last seen in primary care on 03/01/2024 by Joshua Debby CROME, MD.  Called Nurse Triage reporting Eye Problem.  Symptoms began yesterday.  Interventions attempted: OTC medications: Eye drops and Prescription medications: Tramadol .  Symptoms are: gradually worsening.  Triage Disposition: Go to ED Now (Notify PCP)  Patient/caregiver understands and will follow disposition?: No  Copied from CRM [id number]. Topic: Clinical - Red Word Triage >> Mar 14, 2024 12:07 PM Jasmin G wrote: Red Word that prompted transfer to Nurse Triage: Pt initially called to make an appt with Dr. Joshua as he believes that he might've scratched his eyeball last night, pt is experiencing burning and blurry vision. Reason for Disposition  Vision is blurred or lost in either eye  Cut on eyelid or eyeball  (Exception: Superficial scratch on eyelid.)  Answer Assessment - Initial Assessment Questions Advised ED, pt declines. Advised UC at minimum, pt agreeable to go. Sending message HP to office notifying of pts ED refusal.  1. MECHANISM: How did the injury happen?      Pt thinks he scratched it  2. ONSET: When did the injury happen? (e.g., minutes, hours ago)      Last night  3. LOCATION: What part of the eye is injured? (cornea, sclera, eyelid, or periorbital tissue)     Right eyeball  4. APPEARANCE: What does the eye look like?      Pink and red  5. VISION: Do you have blurred vision?     Increased blurriness and difficulty focusing.  6. PAIN: Is it painful? If Yes, ask: How bad is the pain? (Scale 0-10; or none, mild, moderate, severe)     Moderate burning pain  7. SIZE: For cuts, bruises, or swelling, ask: How large is it? (e.g., inches or centimeters)      No  8. TETANUS: For any breaks in the skin, ask: When was your last tetanus booster?     N/a  9. CONTACT LENS: Do you wear contacts?     No. Wears glasses  10.  OTHER SYMPTOMS: Do you have any other symptoms? (e.g., headache, neck pain, vomiting)       Headache, mild. Denies other symptoms.  Protocols used: Eye Injury-A-AH

## 2024-03-18 NOTE — Telephone Encounter (Signed)
 Unable to reach patient. LMTRC

## 2024-03-21 ENCOUNTER — Encounter: Payer: Self-pay | Admitting: Radiology

## 2024-03-21 DIAGNOSIS — K080 Exfoliation of teeth due to systemic causes: Secondary | ICD-10-CM | POA: Diagnosis not present

## 2024-03-24 NOTE — Telephone Encounter (Signed)
 Unable to reach patient. LMTRC

## 2024-03-25 ENCOUNTER — Telehealth: Payer: Self-pay

## 2024-03-25 NOTE — Telephone Encounter (Signed)
 Unable to reach patient. LMTRC

## 2024-03-25 NOTE — Telephone Encounter (Signed)
 Copied from CRM #[id number]. Topic: General - Other >> Mar 25, 2024 10:31 AM Pinkey ORN wrote: Reason for CRM: Returning Missed Office Call >> Mar 25, 2024 10:36 AM Pinkey ORN wrote: Patient is returning missed office call from [place] , Jazunique M, CMA

## 2024-03-28 NOTE — Telephone Encounter (Signed)
 This has been handled in another call.

## 2024-03-28 NOTE — Telephone Encounter (Signed)
 Patient states that his eye is fine now and doesn't need anything from us .

## 2024-03-31 ENCOUNTER — Encounter: Payer: Self-pay | Admitting: Internal Medicine

## 2024-04-04 ENCOUNTER — Other Ambulatory Visit (HOSPITAL_COMMUNITY): Payer: Self-pay

## 2024-04-11 ENCOUNTER — Ambulatory Visit: Admitting: Dermatology

## 2024-04-11 ENCOUNTER — Telehealth: Payer: Self-pay | Admitting: Internal Medicine

## 2024-04-11 NOTE — Telephone Encounter (Signed)
 Copied from CRM [id number]. Topic: General - Other >> Apr 08, 2024  4:21 PM Alexandria E wrote: Reason for CRM: Adina, pharmacist with [employer], called in to check on the status of a form that was faxed over on 10/27. This was a claim adjustment form that they need patient's PCP to sign off on. Callback number for Adina is [phone] ext [phone]. Matt stated he will re-fax this form as well.

## 2024-04-11 NOTE — Telephone Encounter (Signed)
 Awaiting paperwork

## 2024-04-13 ENCOUNTER — Other Ambulatory Visit: Payer: Self-pay | Admitting: Cardiology

## 2024-04-15 ENCOUNTER — Other Ambulatory Visit (HOSPITAL_COMMUNITY): Payer: Self-pay

## 2024-04-26 NOTE — Progress Notes (Unsigned)
 Philip Winters - 63 y.o. male MRN [id number]  Date of birth: 01-12-1961  Office Visit Note: Visit Date: 04/27/2024 PCP: Joshua Debby CROME, MD Referred by: Joshua Debby CROME, MD  Subjective: No chief complaint on file.  HPI: Philip Winters is a pleasant 63 y.o. male who presents today for ***  Pertinent ROS were reviewed with the patient and found to be negative unless otherwise specified above in HPI.   Visit Reason: Duration of symptoms: Hand dominance: {RIGHT/LEFT:20294} Occupation: Diabetic: {yes/no:20286} Smoking: {yes/no:20286} Heart/Lung History: Blood Thinners:   Prior Testing/EMG: Injections (Date): Treatments: Prior Surgery:    Assessment & Plan: Visit Diagnoses: No diagnosis found.  Plan: ***  Follow-up: No follow-ups on file.   Meds & Orders: No orders of the defined types were placed in this encounter.  No orders of the defined types were placed in this encounter.    Procedures: No procedures performed      Clinical History: No specialty comments available.  He reports that he quit smoking about 7 years ago. His smoking use included cigarettes. He started smoking about 45 years ago. He has a 37 pack-year smoking history. He has never used smokeless tobacco.  Recent Labs    12/14/23 1420  HGBA1C 5.3    Objective:   Vital Signs: There were no vitals taken for this visit.  Physical Exam  Gen: Well-appearing, in no acute distress; non-toxic CV: Regular Rate. Well-perfused. Warm.  Resp: Breathing unlabored on room air; no wheezing. Psych: Fluid speech in conversation; appropriate affect; normal thought process  Ortho Exam - ***   Imaging: No results found.  Past Medical/Family/Surgical/Social History: Medications & Allergies reviewed per EMR, new medications updated. Patient Active Problem List   Diagnosis Date Noted   Ascending aortic aneurysm 03/01/2024   Atherosclerosis of both carotid arteries 03/01/2024   Fatty liver 03/01/2024    Gastroesophageal reflux disease 03/01/2024   High alkaline phosphatase 03/01/2024   Insomnia 03/01/2024   Prediabetes 03/01/2024   Raynaud's phenomenon 03/01/2024   Allergic rhinitis 03/01/2024   Neuropathy due to medical condition 12/18/2023   Vitamin B6 induced neuropathy 12/18/2023   Inflamed internal hemorrhoid 12/14/2023   Need for vaccination 12/14/2023   Need for prophylactic vaccination and inoculation against varicella 12/14/2023   Senile purpura 12/14/2023   Aortic stenosis 10/20/2023   S/P TAVR (transcatheter aortic valve replacement) 10/20/2023   PAD (peripheral artery disease)    Arthritis of carpometacarpal (CMC) joint of left thumb 03/13/2023   Encounter for general adult medical examination with abnormal findings 09/22/2022   Tachycardia 09/18/2022   Dyslipidemia, goal LDL below 70 09/18/2022   Idiopathic small fiber peripheral neuropathy 09/18/2022   Chronic hyperglycemia 09/18/2022   Lumbar spinal stenosis 04/15/2022   SBE (subacute bacterial endocarditis) prophylaxis candidate 12/16/2021   Cervical radiculopathy 08/13/2021   Ulnar neuropathy 08/13/2021   Candidal balanitis 06/04/2021   Carpal tunnel syndrome, bilateral upper limbs 09/26/2020   Pulmonary nodule 1 cm or greater in diameter 04/26/2020   Statin intolerance 04/20/2020   Coronary atherosclerosis of native coronary artery 04/18/2020   Nonrheumatic aortic valve stenosis    Bicuspid aortic valve 04/06/2020   Bilateral primary osteoarthritis of knee 09/28/2019   HNP (herniated nucleus pulposus), lumbar 09/29/2017   Unilateral primary osteoarthritis, right knee 06/24/2016   Primary hypertension 01/18/2007   Past Medical History:  Diagnosis Date   Allergy    Arthritis    CAD (coronary artery disease)    GERD (gastroesophageal reflux disease)  occ   High cholesterol    Hypertension    PAD (peripheral artery disease)    Peripheral neuropathy    Right carotid bruit 06/10/2017   S/P TAVR  (transcatheter aortic valve replacement) 10/21/2023   s/p TAVR with a 26 mm Edwards S3UR via the TF approach by Dr. Verlin and Dr. Lucas   Sciatica    Seasonal allergies    Severe aortic stenosis    Sinus tachycardia    Spinal stenosis    Family History  Problem Relation Age of Onset   Hypertension Mother    Throat cancer Mother    COPD Mother    Hypertension Father    Cancer Father        Mesothelioma   Colon cancer Neg Hx    Esophageal cancer Neg Hx    Stomach cancer Neg Hx    Rectal cancer Neg Hx    Colon polyps Neg Hx    Past Surgical History:  Procedure Laterality Date   CARDIAC CATHETERIZATION     CARPAL TUNNEL WITH CUBITAL TUNNEL Left 11/10/2013   Procedure: LEFT CARPAL TUNNEL RELEASE;  Surgeon: Lamar Leonor Mickey LULLA, MD;  Location: [facility];  Service: Orthopedics;  Laterality: Left;   CARPECTOMY Left 03/13/2023   Procedure: LEFT WRIST PROXIMAL ROW CARPECTOMY WITH PLACEMENT ALLOGRAFT SPACER / THUMB CMC DENERVATION;  Surgeon: Arlinda Buster, MD;  Location: [facility];  Service: Orthopedics;  Laterality: Left;   COLONOSCOPY  2023   CORONARY ANGIOGRAPHY N/A 10/21/2023   Procedure: CORONARY ANGIOGRAPHY;  Surgeon: Verlin Lonni BIRCH, MD;  Location: [facility];  Service: Cardiovascular;  Laterality: N/A;   CORONARY ARTERY BYPASS GRAFT N/A 05/01/2020   Procedure: CORONARY ARTERY BYPASS GRAFTING (CABG) X 2 ON CARDIOPULMONARY BYPASS. LIMA TO LAD, SVG TO DIAG;  Surgeon: Fleeta Hanford Coy, MD;  Location: [facility] OR;  Service: Open Heart Surgery;  Laterality: N/A;   CORONARY/GRAFT ANGIOGRAPHY N/A 07/30/2023   Procedure: CORONARY/GRAFT ANGIOGRAPHY;  Surgeon: Verlin Lonni BIRCH, MD;  Location: [facility];  Service: Cardiovascular;  Laterality: N/A;   CORONARY/GRAFT ANGIOGRAPHY N/A 10/20/2023   Procedure: CORONARY ANGIOGRAPHY;  Surgeon: Verlin Lonni BIRCH, MD;  Location: [facility];  Service: Cardiovascular;  Laterality: N/A;    ENDOVEIN HARVEST OF GREATER SAPHENOUS VEIN Right 05/01/2020   Procedure: ENDOVEIN HARVEST OF GREATER SAPHENOUS VEIN;  Surgeon: Fleeta Hanford Coy, MD;  Location: [facility] OR;  Service: Open Heart Surgery;  Laterality: Right;   INTRAOPERATIVE TRANSTHORACIC ECHOCARDIOGRAM N/A 10/20/2023   Procedure: ECHOCARDIOGRAM, TRANSTHORACIC;  Surgeon: Verlin Lonni BIRCH, MD;  Location: [facility];  Service: Cardiovascular;  Laterality: N/A;   KNEE ARTHROSCOPY  [id number]   left and right   LUMBAR LAMINECTOMY/DECOMPRESSION MICRODISCECTOMY Left 09/29/2017   Procedure: LEFT LUMBAR TWO - LUMBAR THREELAMINOTOMY/MICRODISCECTOMY;  Surgeon: Alix Lamar, MD;  Location: [facility] OR;  Service: Neurosurgery;  Laterality: Left;  LEFT LUMBAR 2- LUMBAR 3 LAMINOTOMY/MICRODISCECTOMY   NASAL SINUS SURGERY  05/2015   RIGHT HEART CATH N/A 10/21/2023   Procedure: RIGHT HEART CATH;  Surgeon: Rolan Ezra RAMAN, MD;  Location[facility];  Service: Cardiovascular;  Laterality: N/A;   RIGHT/LEFT HEART CATH AND CORONARY ANGIOGRAPHY N/A 04/10/2020   Procedure: RIGHT/LEFT HEART CATH AND CORONARY ANGIOGRAPHY;  Surgeon: Burnard Debby LABOR, MD;  Location: [facility];  Service: Cardiovascular;  Laterality: N/A;   TEE WITHOUT CARDIOVERSION N/A 05/01/2020   Procedure: TRANSESOPHAGEAL ECHOCARDIOGRAM (TEE);  Surgeon: Fleeta Hanford, Coy, MD;  Location: [facility]  OR;  Service: Open Heart Surgery;  Laterality: N/A;   TEE WITHOUT CARDIOVERSION N/A 10/22/2023   Procedure: ECHOCARDIOGRAM, TRANSESOPHAGEAL;  Surgeon: Kerrin Elspeth BROCKS, MD;  Location: [facility] OR;  Service: Thoracic;  Laterality: N/A;   TOTAL KNEE ARTHROPLASTY Right 06/24/2016   Procedure: TOTAL KNEE ARTHROPLASTY;  Surgeon: Anderson Maude ORN, MD;  Location: [facility] OR;  Service: Orthopedics;  Laterality: Right;   TOTAL KNEE ARTHROPLASTY Left 12/18/2020   Procedure: LEFT TOTAL KNEE ARTHROPLASTY;  Surgeon: Anderson Maude ORN, MD;  Location: [facility];  Service: Orthopedics;  Laterality: Left;    TRANSFORAMINAL LUMBAR INTERBODY FUSION W/ MIS 2 LEVEL Left 04/15/2022   Procedure: Minimally Invasive Surgery Decompression and Transforaminal Lumbar Interbody Fusion , Left , Lumbar three-four, Lumbar four-five;  Surgeon: Dawley, Lani BROCKS, DO;  Location: [facility] OR;  Service: Neurosurgery;  Laterality: Left;   TRIGGER FINGER RELEASE Left 11/10/2013   Procedure: LEFT INDEX A-1 PULLEY RELEASE;  Surgeon: Lamar Leonor Mickey LULLA, MD;  Location: [facility];  Service: Orthopedics;  Laterality: Left;   ULNAR NERVE TRANSPOSITION Left 11/10/2013   Procedure: ULNAR NERVE TRANSPOSITION;  Surgeon: Lamar Leonor Mickey LULLA, MD;  Location: [facility];  Service: Orthopedics;  Laterality: Left;   XI ROBOTIC ASSISTED PERICARDIAL WINDOW Left 10/22/2023   Procedure: CREATION, PERICARDIAL WINDOW, ROBOT-ASSISTED;  Surgeon: Kerrin Elspeth BROCKS, MD;  Location: [facility] OR;  Service: Thoracic;  Laterality: Left;   Social History   Occupational History   Occupation: Self employed    Comment: plumber   Occupation: Retired nutritional therapist  Tobacco Use   Smoking status: Former    Current packs/day: 0.00    Average packs/day: 1 pack/day for 37.0 years (37.0 ttl pk-yrs)    Types: Cigarettes    Start date: 05/05/1979    Quit date: 05/04/2016    Years since quitting: 7.9   Smokeless tobacco: Never  Vaping Use   Vaping status: Former  Substance and Sexual Activity   Alcohol  use: Yes    Alcohol /week: 5.0 standard drinks of alcohol     Types: 5 Glasses of wine per week    Comment: weekends only with dinner   Drug use: No   Sexual activity: Yes    Partners: Female    Bridger Pizzi Estela) Yamin Swingler, M.D. [facility], Hand Surgery

## 2024-04-27 ENCOUNTER — Ambulatory Visit: Admitting: Orthopedic Surgery

## 2024-04-27 ENCOUNTER — Other Ambulatory Visit: Payer: Self-pay

## 2024-04-27 DIAGNOSIS — M79641 Pain in right hand: Secondary | ICD-10-CM | POA: Diagnosis not present

## 2024-04-28 ENCOUNTER — Encounter: Payer: Self-pay | Admitting: Cardiology

## 2024-04-28 ENCOUNTER — Ambulatory Visit: Attending: Internal Medicine | Admitting: Cardiology

## 2024-04-28 ENCOUNTER — Other Ambulatory Visit (HOSPITAL_COMMUNITY): Payer: Self-pay

## 2024-04-28 VITALS — BP 135/74 | HR 119 | Ht 68.0 in | Wt 150.0 lb

## 2024-04-28 DIAGNOSIS — Z9889 Other specified postprocedural states: Secondary | ICD-10-CM

## 2024-04-28 DIAGNOSIS — I739 Peripheral vascular disease, unspecified: Secondary | ICD-10-CM | POA: Diagnosis not present

## 2024-04-28 DIAGNOSIS — I700 Atherosclerosis of aorta: Secondary | ICD-10-CM | POA: Diagnosis not present

## 2024-04-28 DIAGNOSIS — Z952 Presence of prosthetic heart valve: Secondary | ICD-10-CM | POA: Diagnosis not present

## 2024-04-28 DIAGNOSIS — I25110 Atherosclerotic heart disease of native coronary artery with unstable angina pectoris: Secondary | ICD-10-CM | POA: Diagnosis not present

## 2024-04-28 NOTE — Patient Instructions (Signed)

## 2024-04-28 NOTE — Progress Notes (Signed)
 Cardiology Office Note:  .   Date:  04/28/2024  ID:  Philip Winters, DOB 1961-02-22, MRN [id number] PCP: Joshua Debby CROME, MD  [facility] Providers Cardiologist:  Oneil Parchment, MD Structural Heart:  Lonni Cash, MD   History of Present Illness: Philip Winters is a 63 y.o. male Discussed the use of AI scribe   History of Present Illness Philip Winters is a 63 year old male with peripheral arterial disease and coronary artery disease who presents with cramping in his left calf.  He experiences cramping in his left calf, more pronounced on the left side compared to the right. He has been diagnosed with peripheral arterial disease (PAD) and has a history of coronary artery disease, for which he underwent coronary artery bypass grafting (CABG) in 2021 and transcatheter aortic valve replacement (TAVR) in June 2025. Cilostazol  was prescribed for his PAD, but it did not alleviate his symptoms and caused stomach problems, leading to its discontinuation after three weeks. He continues to walk daily but experiences cramping after 15 to 20 minutes, requiring him to stop and rest before continuing.  Following his TAVR procedure in June 2025, he developed a pericardial effusion, leading to a six-day stay in the ICU. Blood accumulated in his chest, necessitating robotic-assisted drainage. He recalls being told he would be home the same day, but instead woke up in the ICU, which was distressing for him.  He has a history of spinal stenosis, peripheral neuropathy, and thoracic aorta enlargement measuring 4.3 cm. He is currently taking pregabalin  for neuropathy, which has helped reduce the severity of his symptoms, particularly at night.  He mentions having carpal tunnel syndrome, which has been affecting his sleep and causing his blood pressure to rise. He recently received a cortisone shot, which allowed him to sleep through the night for the first time in days.  Pluming - Knows the [place]      Studies Reviewed: .        Results DIAGNOSTIC ABIs: 50-74% stenosis in distal CFA and proximal PFA, distal perineal artery occluded TAVR: Moderate pericardial effusion, possible tamponade, robotically assisted left chest drainage, patent coronaries, minor coronary domain ischemia (10/20/2023) Risk Assessment/Calculations:            Physical Exam:   VS:  BP 135/74   Pulse (!) 119   Ht 5' 8 (1.727 m)   Wt 150 lb (68 kg)   SpO2 97%   BMI 22.81 kg/m    Wt Readings from Last 3 Encounters:  04/28/24 150 lb (68 kg)  03/01/24 149 lb 12.8 oz (67.9 kg)  02/29/24 148 lb (67.1 kg)    GEN: Well nourished, well developed in no acute distress NECK: No JVD; No carotid bruits CARDIAC: Tachy Reg, no murmurs, no rubs, no gallops RESPIRATORY:  Clear to auscultation without rales, wheezing or rhonchi  ABDOMEN: Soft, non-tender, non-distended EXTREMITIES:  No edema; No deformity   ASSESSMENT AND PLAN: .    Assessment and Plan Assessment & Plan Peripheral arterial disease with claudication, left leg Lower extremity PAD, left greater than right, with cramping in the left calf. Cilostazol  was ineffective and caused gastrointestinal side effects. He stopped on his own. Encouraged to continue exercise despite claudication symptoms. - Encouraged regular exercise - Instructed to monitor for development of wounds  Coronary artery disease status post CABG Status post two-vessel CABG with SVG to D1 and LIMA to LAD in 2021. Underwent TAVR in June 2025 with complications including pericardial  effusion requiring robotic drainage. Currently on guideline-directed medical therapy. - Continue guideline-directed medical therapy  Aortic stenosis status post TAVR Status post TAVR with 26 mm Edward Sapien valve in June 2025. Post-procedure complications included pericardial effusion requiring robotic drainage. - Continue follow-up with TAVR team  History of pericardial effusion post TAVR Post-TAVR  pericardial effusion required robotic drainage.  Primary hypertension Blood pressure management complicated by sleep disturbances due to carpal tunnel syndrome. Current medications include Olmesartan . - Continue current antihypertensive regimen  Hyperlipidemia Managed with Praluent . - Continue Praluent   Chronic tachycardia Managed with Diltiazem . - Continue Diltiazem          Dispo: 1 yr  Signed, Oneil Parchment, MD

## 2024-04-29 DIAGNOSIS — M65321 Trigger finger, right index finger: Secondary | ICD-10-CM | POA: Diagnosis not present

## 2024-04-29 MED ORDER — BETAMETHASONE SOD PHOS & ACET 6 (3-3) MG/ML IJ SUSP
6.0000 mg | INTRAMUSCULAR | Status: AC | PRN
  Administered 2024-04-29: 6 mg via INTRA_ARTICULAR

## 2024-04-29 MED ORDER — LIDOCAINE HCL 1 % IJ SOLN
1.0000 mL | INTRAMUSCULAR | Status: AC | PRN
  Administered 2024-04-29: 1 mL

## 2024-06-02 ENCOUNTER — Telehealth: Payer: Self-pay

## 2024-06-02 NOTE — Telephone Encounter (Signed)
 Copied from CRM [id number]. Topic: General - Other >> Jun 02, 2024 10:22 AM Hadassah PARAS wrote: Reason for CRM: Kayla from [facility] called to confirm pt is an active pt w Joshua. Will be faxing ocer claims adjustment form to be signed by PCP.  Provided fax #

## 2024-06-07 ENCOUNTER — Other Ambulatory Visit (INDEPENDENT_AMBULATORY_CARE_PROVIDER_SITE_OTHER): Payer: Self-pay

## 2024-06-07 ENCOUNTER — Ambulatory Visit: Admitting: Orthopaedic Surgery

## 2024-06-07 DIAGNOSIS — G8929 Other chronic pain: Secondary | ICD-10-CM

## 2024-06-07 DIAGNOSIS — M25562 Pain in left knee: Secondary | ICD-10-CM | POA: Diagnosis not present

## 2024-06-07 NOTE — Progress Notes (Signed)
 "  Office Visit Note   Patient: Philip Winters           Date of Birth: [birth year]           MRN: [id number] Visit Date: 06/07/2024              Requested by: Joshua Debby CROME, MD [address] [place],  [place] [address] PCP: Joshua Debby CROME, MD   Assessment & Plan: Visit Diagnoses:  1. Chronic pain of left knee     Plan: Patient is chronic pain and left total knee arthroplasty from 2022.  No obvious evidence of implant loosening or infection.  Will obtain bone scan and inflammatory markers to make sure.  We will have him follow-up after the bone scan.  Follow-Up Instructions: Return for Follow-up after bone scan..   Orders:  Orders Placed This Encounter  Procedures   XR KNEE 3 VIEW LEFT   No orders of the defined types were placed in this encounter.     Procedures: No procedures performed   Clinical Data: No additional findings.   Subjective: Chief Complaint  Patient presents with   Left Knee - Pain    HPI Patient is a 64 year old gentleman here for evaluation of chronic left knee pain.  Underwent left total knee arthroplasty in 2022 by Dr. Anderson.  States that he has had pain and discomfort since the surgery and did not experience a pain-free postoperatively.  He denies any complications related to the surgery. Review of Systems  Constitutional: Negative.   HENT: Negative.    Eyes: Negative.   Respiratory: Negative.    Cardiovascular: Negative.   Gastrointestinal: Negative.   Endocrine: Negative.   Genitourinary: Negative.   Skin: Negative.   Allergic/Immunologic: Negative.   Neurological: Negative.   Hematological: Negative.   Psychiatric/Behavioral: Negative.    All other systems reviewed and are negative.    Objective: Vital Signs: There were no vitals taken for this visit.  Physical Exam Vitals and nursing note reviewed.  Constitutional:      Appearance: He is well-developed.  HENT:     Head: Normocephalic and atraumatic.  Eyes:      Pupils: Pupils are equal, round, and reactive to light.  Pulmonary:     Effort: Pulmonary effort is normal.  Abdominal:     Palpations: Abdomen is soft.  Musculoskeletal:        General: Normal range of motion.     Cervical back: Neck supple.  Skin:    General: Skin is warm.  Neurological:     Mental Status: He is alert and oriented to person, place, and time.  Psychiatric:        Behavior: Behavior normal.        Thought Content: Thought content normal.        Judgment: Judgment normal.     Ortho Exam Exam of left knee shows fully healed surgical scar.  It is slightly enlarged compared to the right knee which has also been replaced.  Collaterals are stable.  Few millimeters of anterior and posterior translation.  No joint effusion.  No signs of infection.  Range of motion is 0 to 100 degrees. Specialty Comments:  No specialty comments available.  Imaging: XR KNEE 3 VIEW LEFT Result Date: 06/07/2024 X-rays of the left knee show a left total knee arthroplasty without any evidence of subsidence or loosening.    PMFS History: Patient Active Problem List   Diagnosis Date Noted   Ascending aortic aneurysm  03/01/2024   Atherosclerosis of both carotid arteries 03/01/2024   Fatty liver 03/01/2024   Gastroesophageal reflux disease 03/01/2024   High alkaline phosphatase 03/01/2024   Insomnia 03/01/2024   Prediabetes 03/01/2024   Raynaud's phenomenon 03/01/2024   Allergic rhinitis 03/01/2024   Neuropathy due to medical condition 12/18/2023   Vitamin B6 induced neuropathy 12/18/2023   Inflamed internal hemorrhoid 12/14/2023   Need for vaccination 12/14/2023   Need for prophylactic vaccination and inoculation against varicella 12/14/2023   Senile purpura 12/14/2023   Aortic stenosis 10/20/2023   S/P TAVR (transcatheter aortic valve replacement) 10/20/2023   PAD (peripheral artery disease)    Arthritis of carpometacarpal (CMC) joint of left thumb 03/13/2023   Encounter for  general adult medical examination with abnormal findings 09/22/2022   Tachycardia 09/18/2022   Dyslipidemia, goal LDL below 70 09/18/2022   Idiopathic small fiber peripheral neuropathy 09/18/2022   Chronic hyperglycemia 09/18/2022   Lumbar spinal stenosis 04/15/2022   SBE (subacute bacterial endocarditis) prophylaxis candidate 12/16/2021   Cervical radiculopathy 08/13/2021   Ulnar neuropathy 08/13/2021   Candidal balanitis 06/04/2021   Carpal tunnel syndrome, bilateral upper limbs 09/26/2020   Pulmonary nodule 1 cm or greater in diameter 04/26/2020   Statin intolerance 04/20/2020   Coronary atherosclerosis of native coronary artery 04/18/2020   Nonrheumatic aortic valve stenosis    Bicuspid aortic valve 04/06/2020   Bilateral primary osteoarthritis of knee 09/28/2019   HNP (herniated nucleus pulposus), lumbar 09/29/2017   Unilateral primary osteoarthritis, right knee 06/24/2016   Primary hypertension 01/18/2007   Past Medical History:  Diagnosis Date   Allergy    Arthritis    CAD (coronary artery disease)    GERD (gastroesophageal reflux disease)    occ   High cholesterol    Hypertension    PAD (peripheral artery disease)    Peripheral neuropathy    Right carotid bruit 06/10/2017   S/P TAVR (transcatheter aortic valve replacement) 10/21/2023   s/p TAVR with a 26 mm Edwards S3UR via the TF approach by Dr. Verlin and Dr. Lucas   Sciatica    Seasonal allergies    Severe aortic stenosis    Sinus tachycardia    Spinal stenosis     Family History  Problem Relation Age of Onset   Hypertension Mother    Throat cancer Mother    COPD Mother    Hypertension Father    Cancer Father        Mesothelioma   Colon cancer Neg Hx    Esophageal cancer Neg Hx    Stomach cancer Neg Hx    Rectal cancer Neg Hx    Colon polyps Neg Hx     Past Surgical History:  Procedure Laterality Date   CARDIAC CATHETERIZATION     CARPAL TUNNEL WITH CUBITAL TUNNEL Left 11/10/2013   Procedure:  LEFT CARPAL TUNNEL RELEASE;  Surgeon: Lamar Leonor Mickey LULLA, MD;  Location: [facility];  Service: Orthopedics;  Laterality: Left;   CARPECTOMY Left 03/13/2023   Procedure: LEFT WRIST PROXIMAL ROW CARPECTOMY WITH PLACEMENT ALLOGRAFT SPACER / THUMB CMC DENERVATION;  Surgeon: Arlinda Buster, MD;  Location: [facility];  Service: Orthopedics;  Laterality: Left;   COLONOSCOPY  2023   CORONARY ANGIOGRAPHY N/A 10/21/2023   Procedure: CORONARY ANGIOGRAPHY;  Surgeon: Verlin Lonni BIRCH, MD;  Location: [facility];  Service: Cardiovascular;  Laterality: N/A;   CORONARY ARTERY BYPASS GRAFT N/A 05/01/2020   Procedure: CORONARY ARTERY BYPASS GRAFTING (CABG) X 2 ON CARDIOPULMONARY  BYPASS. LIMA TO LAD, SVG TO DIAG;  Surgeon: Fleeta Hanford Coy, MD;  Location: [facility] OR;  Service: Open Heart Surgery;  Laterality: N/A;   CORONARY/GRAFT ANGIOGRAPHY N/A 07/30/2023   Procedure: CORONARY/GRAFT ANGIOGRAPHY;  Surgeon: Verlin Lonni BIRCH, MD;  Location: [facility];  Service: Cardiovascular;  Laterality: N/A;   CORONARY/GRAFT ANGIOGRAPHY N/A 10/20/2023   Procedure: CORONARY ANGIOGRAPHY;  Surgeon: Verlin Lonni BIRCH, MD;  Location: [facility];  Service: Cardiovascular;  Laterality: N/A;   ENDOVEIN HARVEST OF GREATER SAPHENOUS VEIN Right 05/01/2020   Procedure: ENDOVEIN HARVEST OF GREATER SAPHENOUS VEIN;  Surgeon: Fleeta Hanford Coy, MD;  Location: [facility] OR;  Service: Open Heart Surgery;  Laterality: Right;   INTRAOPERATIVE TRANSTHORACIC ECHOCARDIOGRAM N/A 10/20/2023   Procedure: ECHOCARDIOGRAM, TRANSTHORACIC;  Surgeon: Verlin Lonni BIRCH, MD;  Location: [facility];  Service: Cardiovascular;  Laterality: N/A;   KNEE ARTHROSCOPY  [id number]   left and right   LUMBAR LAMINECTOMY/DECOMPRESSION MICRODISCECTOMY Left 09/29/2017   Procedure: LEFT LUMBAR TWO - LUMBAR THREELAMINOTOMY/MICRODISCECTOMY;  Surgeon: Alix Charleston, MD;  Location: [facility] OR;  Service: Neurosurgery;   Laterality: Left;  LEFT LUMBAR 2- LUMBAR 3 LAMINOTOMY/MICRODISCECTOMY   NASAL SINUS SURGERY  05/2015   RIGHT HEART CATH N/A 10/21/2023   Procedure: RIGHT HEART CATH;  Surgeon: Rolan Ezra RAMAN, MD;  Location: [facility];  Service: Cardiovascular;  Laterality: N/A;   RIGHT/LEFT HEART CATH AND CORONARY ANGIOGRAPHY N/A 04/10/2020   Procedure: RIGHT/LEFT HEART CATH AND CORONARY ANGIOGRAPHY;  Surgeon: Burnard Debby LABOR, MD;  Location: [facility];  Service: Cardiovascular;  Laterality: N/A;   TEE WITHOUT CARDIOVERSION N/A 05/01/2020   Procedure: TRANSESOPHAGEAL ECHOCARDIOGRAM (TEE);  Surgeon: Fleeta Hanford, Coy, MD;  Location[facility] OR;  Service: Open Heart Surgery;  Laterality: N/A;   TEE WITHOUT CARDIOVERSION N/A 10/22/2023   Procedure: ECHOCARDIOGRAM, TRANSESOPHAGEAL;  Surgeon: Kerrin Elspeth BROCKS, MD;  Location: [facility] OR;  Service: Thoracic;  Laterality: N/A;   TOTAL KNEE ARTHROPLASTY Right 06/24/2016   Procedure: TOTAL KNEE ARTHROPLASTY;  Surgeon: Anderson Coy ORN, MD;  Location: [facility] OR;  Service: Orthopedics;  Laterality: Right;   TOTAL KNEE ARTHROPLASTY Left 12/18/2020   Procedure: LEFT TOTAL KNEE ARTHROPLASTY;  Surgeon: Anderson Coy ORN, MD;  Location: [facility];  Service: Orthopedics;  Laterality: Left;   TRANSFORAMINAL LUMBAR INTERBODY FUSION W/ MIS 2 LEVEL Left 04/15/2022   Procedure: Minimally Invasive Surgery Decompression and Transforaminal Lumbar Interbody Fusion , Left , Lumbar three-four, Lumbar four-five;  Surgeon: Dawley, Lani BROCKS, DO;  Location: [facility] OR;  Service: Neurosurgery;  Laterality: Left;   TRIGGER FINGER RELEASE Left 11/10/2013   Procedure: LEFT INDEX A-1 PULLEY RELEASE;  Surgeon: Charleston Leonor Mickey LULLA, MD;  Location: [facility];  Service: Orthopedics;  Laterality: Left;   ULNAR NERVE TRANSPOSITION Left 11/10/2013   Procedure: ULNAR NERVE TRANSPOSITION;  Surgeon: Charleston Leonor Mickey LULLA, MD;  Location: [facility];  Service: Orthopedics;  Laterality:  Left;   XI ROBOTIC ASSISTED PERICARDIAL WINDOW Left 10/22/2023   Procedure: CREATION, PERICARDIAL WINDOW, ROBOT-ASSISTED;  Surgeon: Kerrin Elspeth BROCKS, MD;  Location: [facility] OR;  Service: Thoracic;  Laterality: Left;   Social History   Occupational History   Occupation: Self employed    Comment: plumber   Occupation: Retired nutritional therapist  Tobacco Use   Smoking status: Former    Current packs/day: 0.00    Average packs/day: 1 pack/day for 37.0 years (37.0 ttl pk-yrs)    Types: Cigarettes    Start date: 05/05/1979  Quit date: 05/04/2016    Years since quitting: 8.0   Smokeless tobacco: Never  Vaping Use   Vaping status: Former  Substance and Sexual Activity   Alcohol  use: Yes    Alcohol /week: 5.0 standard drinks of alcohol     Types: 5 Glasses of wine per week    Comment: weekends only with dinner   Drug use: No   Sexual activity: Yes    Partners: Female        "

## 2024-06-08 ENCOUNTER — Other Ambulatory Visit: Payer: Self-pay | Admitting: Internal Medicine

## 2024-06-08 DIAGNOSIS — M1711 Unilateral primary osteoarthritis, right knee: Secondary | ICD-10-CM

## 2024-06-08 DIAGNOSIS — M170 Bilateral primary osteoarthritis of knee: Secondary | ICD-10-CM

## 2024-06-08 DIAGNOSIS — M48061 Spinal stenosis, lumbar region without neurogenic claudication: Secondary | ICD-10-CM

## 2024-06-08 LAB — CBC WITH DIFFERENTIAL/PLATELET
Absolute Lymphocytes: 1355 {cells}/uL (ref 850–3900)
Absolute Monocytes: 538 {cells}/uL (ref 200–950)
Basophils Absolute: 129 {cells}/uL (ref 0–200)
Basophils Relative: 2.3 %
Eosinophils Absolute: 90 {cells}/uL (ref 15–500)
Eosinophils Relative: 1.6 %
HCT: 45.4 % (ref 39.4–51.1)
Hemoglobin: 15.2 g/dL (ref 13.2–17.1)
MCH: 32.1 pg (ref 27.0–33.0)
MCHC: 33.5 g/dL (ref 31.6–35.4)
MCV: 95.8 fL (ref 81.4–101.7)
MPV: 10.1 fL (ref 7.5–12.5)
Monocytes Relative: 9.6 %
Neutro Abs: 3489 {cells}/uL (ref 1500–7800)
Neutrophils Relative %: 62.3 %
Platelets: 187 Thousand/uL (ref 140–400)
RBC: 4.74 Million/uL (ref 4.20–5.80)
RDW: 12 % (ref 11.0–15.0)
Total Lymphocyte: 24.2 %
WBC: 5.6 Thousand/uL (ref 3.8–10.8)

## 2024-06-08 LAB — SEDIMENTATION RATE: Sed Rate: 11 mm/h (ref 0–20)

## 2024-06-08 LAB — C-REACTIVE PROTEIN: CRP: <3 mg/L

## 2024-06-08 NOTE — Telephone Encounter (Signed)
 Copied from CRM #[id number]. Topic: General - Other >> Jun 08, 2024 11:17 AM Roselie BROCKS wrote: Reason for CRM: Shilpi from [facility] called to confirm patient is active with DR Joshua, and is requesting that a fax they have faxed over to be signed and faxed back as soon as possible for claim adjustment for patient.SABRA Session needs a return call to see if can provide form by email. Please call , Only have to end of this month to respond . [phone]  ext [phone]

## 2024-06-08 NOTE — Telephone Encounter (Signed)
 I advised her that once I receive the fax I will give it to Dr. Joshua and fax it back to them before the deadline of 06/18/2024

## 2024-06-15 ENCOUNTER — Ambulatory Visit (HOSPITAL_COMMUNITY)

## 2024-06-15 ENCOUNTER — Other Ambulatory Visit (HOSPITAL_COMMUNITY): Payer: Self-pay

## 2024-06-15 DIAGNOSIS — M4326 Fusion of spine, lumbar region: Secondary | ICD-10-CM

## 2024-06-15 DIAGNOSIS — M5416 Radiculopathy, lumbar region: Secondary | ICD-10-CM

## 2024-06-16 ENCOUNTER — Telehealth: Payer: Self-pay

## 2024-06-16 NOTE — Telephone Encounter (Signed)
 Copied from CRM [id number]. Topic: Clinical - Medical Advice >> Jun 16, 2024  4:23 PM Charolett L wrote: Reason for CRM: [place] from [facility] [employer] [facility] calling in to verify that the claims form was received by the doctor via email and fax for him to sign and return  Cb# [phone]

## 2024-06-22 ENCOUNTER — Ambulatory Visit (HOSPITAL_COMMUNITY): Admission: RE | Admit: 2024-06-22 | Source: Ambulatory Visit

## 2024-06-22 DIAGNOSIS — G8929 Other chronic pain: Secondary | ICD-10-CM

## 2024-06-22 MED ORDER — TECHNETIUM TC 99M MEDRONATE IV KIT
20.0000 | PACK | Freq: Once | INTRAVENOUS | Status: AC | PRN
  Administered 2024-06-22: 21.9 via INTRAVENOUS

## 2024-06-24 ENCOUNTER — Ambulatory Visit: Payer: Self-pay | Admitting: Orthopaedic Surgery

## 2024-06-24 ENCOUNTER — Ambulatory Visit (HOSPITAL_COMMUNITY): Admission: RE | Admit: 2024-06-24 | Source: Ambulatory Visit

## 2024-06-24 DIAGNOSIS — M4326 Fusion of spine, lumbar region: Secondary | ICD-10-CM

## 2024-06-24 DIAGNOSIS — M5416 Radiculopathy, lumbar region: Secondary | ICD-10-CM

## 2024-08-29 ENCOUNTER — Ambulatory Visit (HOSPITAL_COMMUNITY)

## 2024-08-29 ENCOUNTER — Ambulatory Visit

## 2024-10-24 ENCOUNTER — Ambulatory Visit: Admitting: Physician Assistant

## 2024-10-24 ENCOUNTER — Other Ambulatory Visit (HOSPITAL_COMMUNITY)

## 2024-11-07 ENCOUNTER — Encounter: Admitting: Internal Medicine

## 2024-11-07 ENCOUNTER — Ambulatory Visit

## 2024-12-14 ENCOUNTER — Encounter: Admitting: Internal Medicine
